# Patient Record
Sex: Male | Born: 1940
Health system: Southern US, Community
[De-identification: ages and names within clinical notes are randomized; demographics above are authoritative.]

## PROBLEM LIST (undated history)

## (undated) DIAGNOSIS — I700 Atherosclerosis of aorta: Secondary | ICD-10-CM

## (undated) DIAGNOSIS — I509 Heart failure, unspecified: Secondary | ICD-10-CM

## (undated) DIAGNOSIS — N2581 Secondary hyperparathyroidism of renal origin: Secondary | ICD-10-CM

## (undated) DIAGNOSIS — C801 Malignant (primary) neoplasm, unspecified: Secondary | ICD-10-CM

## (undated) DIAGNOSIS — Z8616 Personal history of COVID-19: Secondary | ICD-10-CM

## (undated) DIAGNOSIS — G4730 Sleep apnea, unspecified: Secondary | ICD-10-CM

## (undated) DIAGNOSIS — E1129 Type 2 diabetes mellitus with other diabetic kidney complication: Secondary | ICD-10-CM

## (undated) DIAGNOSIS — R600 Localized edema: Secondary | ICD-10-CM

## (undated) DIAGNOSIS — G4733 Obstructive sleep apnea (adult) (pediatric): Secondary | ICD-10-CM

## (undated) DIAGNOSIS — R609 Edema, unspecified: Secondary | ICD-10-CM

## (undated) DIAGNOSIS — K4090 Unilateral inguinal hernia, without obstruction or gangrene, not specified as recurrent: Secondary | ICD-10-CM

## (undated) DIAGNOSIS — N184 Chronic kidney disease, stage 4 (severe): Secondary | ICD-10-CM

## (undated) DIAGNOSIS — R0600 Dyspnea, unspecified: Secondary | ICD-10-CM

## (undated) DIAGNOSIS — C4490 Unspecified malignant neoplasm of skin, unspecified: Secondary | ICD-10-CM

## (undated) DIAGNOSIS — J45909 Unspecified asthma, uncomplicated: Secondary | ICD-10-CM

## (undated) DIAGNOSIS — I2510 Atherosclerotic heart disease of native coronary artery without angina pectoris: Secondary | ICD-10-CM

## (undated) DIAGNOSIS — E785 Hyperlipidemia, unspecified: Secondary | ICD-10-CM

## (undated) DIAGNOSIS — G629 Polyneuropathy, unspecified: Secondary | ICD-10-CM

## (undated) DIAGNOSIS — N189 Chronic kidney disease, unspecified: Secondary | ICD-10-CM

## (undated) DIAGNOSIS — E1140 Type 2 diabetes mellitus with diabetic neuropathy, unspecified: Secondary | ICD-10-CM

## (undated) DIAGNOSIS — N200 Calculus of kidney: Secondary | ICD-10-CM

## (undated) DIAGNOSIS — E669 Obesity, unspecified: Secondary | ICD-10-CM

## (undated) DIAGNOSIS — N400 Enlarged prostate without lower urinary tract symptoms: Secondary | ICD-10-CM

## (undated) DIAGNOSIS — I5030 Unspecified diastolic (congestive) heart failure: Secondary | ICD-10-CM

## (undated) DIAGNOSIS — R0609 Other forms of dyspnea: Secondary | ICD-10-CM

## (undated) DIAGNOSIS — G510 Bell's palsy: Secondary | ICD-10-CM

## (undated) DIAGNOSIS — I10 Essential (primary) hypertension: Secondary | ICD-10-CM

## (undated) DIAGNOSIS — Z87442 Personal history of urinary calculi: Secondary | ICD-10-CM

## (undated) DIAGNOSIS — M67431 Ganglion, right wrist: Secondary | ICD-10-CM

## (undated) DIAGNOSIS — M1990 Unspecified osteoarthritis, unspecified site: Secondary | ICD-10-CM

## (undated) DIAGNOSIS — K760 Fatty (change of) liver, not elsewhere classified: Secondary | ICD-10-CM

## (undated) DIAGNOSIS — R7989 Other specified abnormal findings of blood chemistry: Secondary | ICD-10-CM

## (undated) DIAGNOSIS — D631 Anemia in chronic kidney disease: Secondary | ICD-10-CM

## (undated) DIAGNOSIS — E119 Type 2 diabetes mellitus without complications: Secondary | ICD-10-CM

## (undated) HISTORY — DX: Heart failure, unspecified: I50.9

## (undated) HISTORY — PX: INGUINAL HERNIA REPAIR: SUR1180

## (undated) HISTORY — PX: HERNIA REPAIR: SHX51

## (undated) HISTORY — DX: Calculus of kidney: N20.0

## (undated) HISTORY — PX: COLONOSCOPY: SHX174

## (undated) HISTORY — PX: WRIST FRACTURE SURGERY: SHX121

## (undated) HISTORY — PX: HAND SURGERY: SHX662

## (undated) HISTORY — PX: TONSILLECTOMY: SUR1361

## (undated) HISTORY — PX: GANGLION CYST EXCISION: SHX1691

## (undated) HISTORY — PX: CHOLECYSTECTOMY: SHX55

## (undated) HISTORY — PX: FRACTURE SURGERY: SHX138

## (undated) HISTORY — PX: APPENDECTOMY: SHX54

---

## 2005-01-22 ENCOUNTER — Emergency Department: Payer: Self-pay | Admitting: Unknown Physician Specialty

## 2005-01-22 ENCOUNTER — Other Ambulatory Visit: Payer: Self-pay

## 2005-01-23 ENCOUNTER — Ambulatory Visit: Payer: Self-pay | Admitting: Unknown Physician Specialty

## 2005-02-06 ENCOUNTER — Ambulatory Visit: Payer: Self-pay | Admitting: Vascular Surgery

## 2007-02-18 ENCOUNTER — Ambulatory Visit: Payer: Self-pay | Admitting: Otolaryngology

## 2007-02-23 ENCOUNTER — Ambulatory Visit: Payer: Self-pay | Admitting: Otolaryngology

## 2009-09-16 ENCOUNTER — Inpatient Hospital Stay: Payer: Self-pay | Admitting: Internal Medicine

## 2010-01-08 ENCOUNTER — Ambulatory Visit: Payer: Self-pay | Admitting: Unknown Physician Specialty

## 2010-05-17 ENCOUNTER — Ambulatory Visit: Payer: Self-pay | Admitting: Internal Medicine

## 2011-04-25 ENCOUNTER — Ambulatory Visit: Payer: Self-pay | Admitting: Orthopedic Surgery

## 2011-04-25 DIAGNOSIS — E119 Type 2 diabetes mellitus without complications: Secondary | ICD-10-CM

## 2011-05-02 ENCOUNTER — Ambulatory Visit: Payer: Self-pay | Admitting: Orthopedic Surgery

## 2011-05-07 LAB — PATHOLOGY REPORT

## 2012-11-04 ENCOUNTER — Emergency Department: Payer: Self-pay | Admitting: Emergency Medicine

## 2013-03-29 ENCOUNTER — Ambulatory Visit: Payer: Self-pay | Admitting: Unknown Physician Specialty

## 2013-11-15 ENCOUNTER — Ambulatory Visit: Payer: Self-pay | Admitting: Family Medicine

## 2013-11-27 ENCOUNTER — Emergency Department: Payer: Self-pay | Admitting: Emergency Medicine

## 2013-11-27 LAB — COMPREHENSIVE METABOLIC PANEL
ALK PHOS: 82 U/L
Albumin: 3.1 g/dL — ABNORMAL LOW (ref 3.4–5.0)
Anion Gap: 7 (ref 7–16)
BUN: 42 mg/dL — ABNORMAL HIGH (ref 7–18)
Bilirubin,Total: 0.4 mg/dL (ref 0.2–1.0)
CALCIUM: 7.6 mg/dL — AB (ref 8.5–10.1)
CHLORIDE: 105 mmol/L (ref 98–107)
CREATININE: 2.14 mg/dL — AB (ref 0.60–1.30)
Co2: 25 mmol/L (ref 21–32)
EGFR (Non-African Amer.): 30 — ABNORMAL LOW
GFR CALC AF AMER: 34 — AB
Glucose: 98 mg/dL (ref 65–99)
Osmolality: 284 (ref 275–301)
POTASSIUM: 3.5 mmol/L (ref 3.5–5.1)
SGOT(AST): 47 U/L — ABNORMAL HIGH (ref 15–37)
SGPT (ALT): 41 U/L (ref 12–78)
Sodium: 137 mmol/L (ref 136–145)
TOTAL PROTEIN: 7.4 g/dL (ref 6.4–8.2)

## 2013-11-27 LAB — TROPONIN I

## 2013-11-27 LAB — CBC WITH DIFFERENTIAL/PLATELET
Basophil #: 0.1 10*3/uL (ref 0.0–0.1)
Basophil %: 0.6 %
Eosinophil #: 0.2 10*3/uL (ref 0.0–0.7)
Eosinophil %: 1.1 %
HCT: 49.7 % (ref 40.0–52.0)
HGB: 15.6 g/dL (ref 13.0–18.0)
LYMPHS PCT: 6.1 %
Lymphocyte #: 1.1 10*3/uL (ref 1.0–3.6)
MCH: 27.6 pg (ref 26.0–34.0)
MCHC: 31.3 g/dL — AB (ref 32.0–36.0)
MCV: 88 fL (ref 80–100)
Monocyte #: 1 x10 3/mm (ref 0.2–1.0)
Monocyte %: 5.8 %
Neutrophil #: 15.2 10*3/uL — ABNORMAL HIGH (ref 1.4–6.5)
Neutrophil %: 86.4 %
Platelet: 343 10*3/uL (ref 150–440)
RBC: 5.65 10*6/uL (ref 4.40–5.90)
RDW: 14.6 % — ABNORMAL HIGH (ref 11.5–14.5)
WBC: 17.5 10*3/uL — ABNORMAL HIGH (ref 3.8–10.6)

## 2013-11-27 LAB — URINALYSIS, COMPLETE
BILIRUBIN, UR: NEGATIVE
Bacteria: NONE SEEN
GLUCOSE, UR: NEGATIVE mg/dL (ref 0–75)
Ketone: NEGATIVE
Leukocyte Esterase: NEGATIVE
Nitrite: NEGATIVE
PH: 5 (ref 4.5–8.0)
Protein: 30
RBC,UR: <1 /HPF (ref 0–5)
Specific Gravity: 1.008 (ref 1.003–1.030)
Squamous Epithelial: NONE SEEN

## 2013-11-27 LAB — LIPASE, BLOOD: Lipase: 98 U/L (ref 73–393)

## 2013-11-27 LAB — CLOSTRIDIUM DIFFICILE(ARMC)

## 2013-11-29 LAB — STOOL CULTURE

## 2014-03-19 DIAGNOSIS — E1129 Type 2 diabetes mellitus with other diabetic kidney complication: Secondary | ICD-10-CM | POA: Insufficient documentation

## 2014-03-19 DIAGNOSIS — I10 Essential (primary) hypertension: Secondary | ICD-10-CM | POA: Insufficient documentation

## 2014-03-19 DIAGNOSIS — G4730 Sleep apnea, unspecified: Secondary | ICD-10-CM | POA: Insufficient documentation

## 2014-03-19 DIAGNOSIS — E785 Hyperlipidemia, unspecified: Secondary | ICD-10-CM | POA: Insufficient documentation

## 2014-03-29 ENCOUNTER — Ambulatory Visit: Payer: Self-pay

## 2014-03-31 ENCOUNTER — Ambulatory Visit: Payer: Self-pay

## 2014-04-08 LAB — WOUND CULTURE

## 2014-05-05 ENCOUNTER — Ambulatory Visit: Payer: Self-pay

## 2014-09-12 DIAGNOSIS — J3089 Other allergic rhinitis: Secondary | ICD-10-CM | POA: Insufficient documentation

## 2014-11-11 DIAGNOSIS — Z794 Long term (current) use of insulin: Secondary | ICD-10-CM | POA: Insufficient documentation

## 2014-11-11 DIAGNOSIS — E66813 Obesity, class 3: Secondary | ICD-10-CM | POA: Insufficient documentation

## 2014-11-11 DIAGNOSIS — R809 Proteinuria, unspecified: Secondary | ICD-10-CM | POA: Insufficient documentation

## 2015-08-04 DIAGNOSIS — G4733 Obstructive sleep apnea (adult) (pediatric): Secondary | ICD-10-CM | POA: Diagnosis not present

## 2015-08-12 ENCOUNTER — Emergency Department
Admission: EM | Admit: 2015-08-12 | Discharge: 2015-08-12 | Disposition: A | Discharge status: Home / Self Care | Payer: PPO | Attending: Emergency Medicine | Admitting: Emergency Medicine

## 2015-08-12 DIAGNOSIS — I10 Essential (primary) hypertension: Secondary | ICD-10-CM | POA: Diagnosis not present

## 2015-08-12 DIAGNOSIS — E11649 Type 2 diabetes mellitus with hypoglycemia without coma: Secondary | ICD-10-CM | POA: Diagnosis not present

## 2015-08-12 DIAGNOSIS — R2240 Localized swelling, mass and lump, unspecified lower limb: Secondary | ICD-10-CM | POA: Insufficient documentation

## 2015-08-12 DIAGNOSIS — E162 Hypoglycemia, unspecified: Secondary | ICD-10-CM | POA: Diagnosis not present

## 2015-08-12 DIAGNOSIS — Z794 Long term (current) use of insulin: Secondary | ICD-10-CM | POA: Diagnosis not present

## 2015-08-12 HISTORY — DX: Essential (primary) hypertension: I10

## 2015-08-12 HISTORY — DX: Type 2 diabetes mellitus without complications: E11.9

## 2015-08-12 HISTORY — DX: Hyperlipidemia, unspecified: E78.5

## 2015-08-12 LAB — URINALYSIS COMPLETE WITH MICROSCOPIC (ARMC ONLY)
BACTERIA UA: NONE SEEN
Bilirubin Urine: NEGATIVE
GLUCOSE, UA: 50 mg/dL — AB
Ketones, ur: NEGATIVE mg/dL
Leukocytes, UA: NEGATIVE
Nitrite: NEGATIVE
PROTEIN: 100 mg/dL — AB
SPECIFIC GRAVITY, URINE: 1.013 (ref 1.005–1.030)
pH: 5 (ref 5.0–8.0)

## 2015-08-12 LAB — COMPREHENSIVE METABOLIC PANEL
ALT: 22 U/L (ref 17–63)
AST: 24 U/L (ref 15–41)
Albumin: 3.3 g/dL — ABNORMAL LOW (ref 3.5–5.0)
Alkaline Phosphatase: 84 U/L (ref 38–126)
Anion gap: 9 (ref 5–15)
BILIRUBIN TOTAL: 0.4 mg/dL (ref 0.3–1.2)
BUN: 42 mg/dL — AB (ref 6–20)
CO2: 30 mmol/L (ref 22–32)
CREATININE: 1.82 mg/dL — AB (ref 0.61–1.24)
Calcium: 9.5 mg/dL (ref 8.9–10.3)
Chloride: 100 mmol/L — ABNORMAL LOW (ref 101–111)
GFR calc Af Amer: 40 mL/min — ABNORMAL LOW (ref >60)
GFR, EST NON AFRICAN AMERICAN: 35 mL/min — AB (ref >60)
Glucose, Bld: 149 mg/dL — ABNORMAL HIGH (ref 65–99)
Potassium: 3.9 mmol/L (ref 3.5–5.1)
Sodium: 139 mmol/L (ref 135–145)
TOTAL PROTEIN: 7.5 g/dL (ref 6.5–8.1)

## 2015-08-12 LAB — CBC
HEMATOCRIT: 43.1 % (ref 40.0–52.0)
Hemoglobin: 13.8 g/dL (ref 13.0–18.0)
MCH: 27.8 pg (ref 26.0–34.0)
MCHC: 32.1 g/dL (ref 32.0–36.0)
MCV: 86.8 fL (ref 80.0–100.0)
Platelets: 283 10*3/uL (ref 150–440)
RBC: 4.96 MIL/uL (ref 4.40–5.90)
RDW: 14.1 % (ref 11.5–14.5)
WBC: 12.3 10*3/uL — AB (ref 3.8–10.6)

## 2015-08-12 LAB — GLUCOSE, CAPILLARY
GLUCOSE-CAPILLARY: 143 mg/dL — AB (ref 65–99)
GLUCOSE-CAPILLARY: 127 mg/dL — AB (ref 65–99)
GLUCOSE-CAPILLARY: 88 mg/dL (ref 65–99)
GLUCOSE-CAPILLARY: 142 mg/dL — AB (ref 65–99)
Glucose-Capillary: 196 mg/dL — ABNORMAL HIGH (ref 65–99)

## 2015-08-12 LAB — TROPONIN I
TROPONIN I: 0.03 ng/mL (ref <0.031)
Troponin I: 0.04 ng/mL — ABNORMAL HIGH (ref <0.031)

## 2015-08-12 LAB — LIPASE, BLOOD: LIPASE: 28 U/L (ref 11–51)

## 2015-08-12 NOTE — Discharge Instructions (Signed)
Your blood sugar levels appear to have improved.  As discussed with endocrinology: Please stop Levemir You may continue your Humulin R at your normal meal time dosing.  Make sure that you check your blood sugar at least every 4 hours, and once at 1 or 2 in the morning. If any of your blood sugars are less than 80, or you develop symptoms of low blood sugar such as sweating, confusion, weakness, or other concerns please call 911 or the on-call endocrinologist.  Return to the emergency room and call 911 right away if you have sudden or severe confusion, numbness or tingling, chest pain, nausea and vomiting, become unresponsive or have a seizure, or other new concerns arise

## 2015-08-12 NOTE — ED Notes (Signed)
Pt reports hypoglycemia since last nights, states lowest it has gotten has been 40s. Been eating lots of sweets/OJ. Reports dizziness/shaking. currently 169

## 2015-08-12 NOTE — ED Provider Notes (Signed)
[facility] Emergency Department Provider Note  ____________________________________________  Time seen: Approximately 2:39 PM  I have reviewed the triage vital signs and the nursing notes.   HISTORY  Chief Complaint Hypoglycemia    HPI Viviano Fladger is a 75 y.o. male history of diabetes, hypertension.  Patient reports that in his normal health, but since last evening he's had a couple episodes. He's been getting sweaty and had low blood sugars low as 47. He reports that he is using his insulin normally and did not use any of his sliding scale insulin today. He takes up to 80 units of long-acting insulin nightly, and reports this dose has not been adjusted for several years. He is unclear why, and does not believe he accidentally overdosed. He reports he otherwise feels well. He drinks several orange juices, had a meal this morning and his blood sugar was still only in the 80s so he felt he should come for evaluation as he continues to have about 2 episodes of low blood sugar despite eating normally and not using any further insulin today.  No headache, fevers, chills, cough, numbness, weakness or tingling.  Past Medical History  Diagnosis Date  . Diabetes mellitus without complication ([place])   . Hypertension   . Hyperlipemia     There are no active problems to display for this patient.   History reviewed. No pertinent past surgical history.  No current outpatient prescriptions on file.  Allergies Codeine  No family history on file.  Social History Social History  Substance Use Topics  . Smoking status: Never Smoker   . Smokeless tobacco: None  . Alcohol Use: No    Review of Systems Constitutional: No fever/chills Eyes: No visual changes. ENT: No sore throat. Cardiovascular: Denies chest pain. Respiratory: Denies shortness of breath. Gastrointestinal: No abdominal pain.  No nausea, no vomiting.  No diarrhea.  No  constipation. Genitourinary: Negative for dysuria. Musculoskeletal: Negative for back pain. Skin: Negative or rash. Neurological: Negative for headaches, focal weakness or numbness.  10-point ROS otherwise negative.  ____________________________________________   PHYSICAL EXAM:  VITAL SIGNS: ED Triage Vitals  Enc Vitals Group     BP 08/12/15 1030 137/73 mmHg     Pulse Rate 08/12/15 1030 68     Resp 08/12/15 1030 18     Temp --      Temp src --      SpO2 08/12/15 1030 98 %     Weight --      Height --      Head Cir --      Peak Flow --      Pain Score --      Pain Loc --      Pain Edu? --      Excl. in [place]? --    Constitutional: Alert and oriented. Well appearing and in no acute distress. Eyes: Conjunctivae are normal. PERRL. EOMI. Head: Atraumatic. Nose: No congestion/rhinnorhea. Mouth/Throat: Mucous membranes are moist.  Oropharynx non-erythematous. Neck: No stridor.   Cardiovascular: Normal rate, regular rhythm. Grossly normal heart sounds.  Good peripheral circulation. Respiratory: Normal respiratory effort.  No retractions. Lungs CTAB. Gastrointestinal: Soft and nontender. No distention. No abdominal bruits. No CVA tenderness. Musculoskeletal: No lower extremity tenderness with 1+ lower extremity pitting edema which the patient reports he's had stably for years.  No joint effusions. Neurologic:  Normal speech and language. No gross focal neurologic deficits are appreciated. No gait instability. Skin:  Skin is warm, dry and  intact. No rash noted. Psychiatric: Mood and affect are normal. Speech and behavior are normal.  ____________________________________________   LABS (all labs ordered are listed, but only abnormal results are displayed)  Labs Reviewed  CBC - Abnormal; Notable for the following:    WBC 12.3 (*)    All other components within normal limits  COMPREHENSIVE METABOLIC PANEL - Abnormal; Notable for the following:    Chloride 100 (*)    Glucose,  Bld 149 (*)    BUN 42 (*)    Creatinine, Ser 1.82 (*)    Albumin 3.3 (*)    GFR calc non Af Amer 35 (*)    GFR calc Af Amer 40 (*)    All other components within normal limits  TROPONIN I - Abnormal; Notable for the following:    Troponin I 0.04 (*)    All other components within normal limits  URINALYSIS COMPLETEWITH MICROSCOPIC ([facility] ONLY) - Abnormal; Notable for the following:    Color, Urine STRAW (*)    APPearance CLEAR (*)    Glucose, UA 50 (*)    Hgb urine dipstick 1+ (*)    Protein, ur 100 (*)    Squamous Epithelial / LPF 0-5 (*)    All other components within normal limits  GLUCOSE, CAPILLARY - Abnormal; Notable for the following:    Glucose-Capillary 143 (*)    All other components within normal limits  GLUCOSE, CAPILLARY - Abnormal; Notable for the following:    Glucose-Capillary 127 (*)    All other components within normal limits  GLUCOSE, CAPILLARY - Abnormal; Notable for the following:    Glucose-Capillary 196 (*)    All other components within normal limits  LIPASE, BLOOD  GLUCOSE, CAPILLARY  TROPONIN I  CBG MONITORING, ED  CBG MONITORING, ED  CBG MONITORING, ED   ____________________________________________  EKG  Reviewed and interpreted by me at 1120 EKG normal sinus rhythm, mild artifact is present but no acute ischemic abnormality noted. QTc 420 QRS 110 PR 116 Heart rate 65  ____________________________________________  RADIOLOGY   ____________________________________________   PROCEDURES  Procedure(s) performed: None  Critical Care performed: No  ____________________________________________   INITIAL IMPRESSION / ASSESSMENT AND PLAN / ED COURSE  Pertinent labs & imaging results that were available during my care of the patient were reviewed by me and considered in my medical decision making (see chart for details).  Patient presents for evaluation of hypoglycemia. The 2 episodes early this morning, continues to use his insulin  normally. He did not use sliding scale as morning. Little unclear as to the exact cause of his hypoglycemia.  D/W Dr. Berneice Gandy Endocrinology: [place] Advises Stop Levemir.until seen in follow-up with Dr. Gabriel Carina (who has been notifed by endocrine MD) Continue his Humalin with meals. No nighttime insulin. Recommends discharge.  Patient's blood sugars have stabilized, now improving. Have a safe plan of care and placed in patient and family have close contact of recent broken with endocrinology. Endocrinology advises discharge, stopping Levemir and plan clear with family. Second troponin normal, second EKG unchanged with no evidence of ischemic disease.  Discharge home awake alert no distress. Resting comfortably. ____________________________________________   FINAL CLINICAL IMPRESSION(S) / ED DIAGNOSES  Final diagnoses:  Hypoglycemia      Delman Kitten, MD 08/12/15 1600

## 2015-08-14 DIAGNOSIS — R809 Proteinuria, unspecified: Secondary | ICD-10-CM | POA: Diagnosis not present

## 2015-08-14 DIAGNOSIS — E11649 Type 2 diabetes mellitus with hypoglycemia without coma: Secondary | ICD-10-CM | POA: Diagnosis not present

## 2015-08-14 DIAGNOSIS — N184 Chronic kidney disease, stage 4 (severe): Secondary | ICD-10-CM | POA: Diagnosis not present

## 2015-08-14 DIAGNOSIS — Z794 Long term (current) use of insulin: Secondary | ICD-10-CM | POA: Diagnosis not present

## 2015-08-14 DIAGNOSIS — E1122 Type 2 diabetes mellitus with diabetic chronic kidney disease: Secondary | ICD-10-CM | POA: Diagnosis not present

## 2015-08-18 DIAGNOSIS — R809 Proteinuria, unspecified: Secondary | ICD-10-CM | POA: Diagnosis not present

## 2015-08-18 DIAGNOSIS — N183 Chronic kidney disease, stage 3 (moderate): Secondary | ICD-10-CM | POA: Diagnosis not present

## 2015-08-18 DIAGNOSIS — Z794 Long term (current) use of insulin: Secondary | ICD-10-CM | POA: Diagnosis not present

## 2015-08-18 DIAGNOSIS — E1122 Type 2 diabetes mellitus with diabetic chronic kidney disease: Secondary | ICD-10-CM | POA: Diagnosis not present

## 2015-08-25 DIAGNOSIS — Z794 Long term (current) use of insulin: Secondary | ICD-10-CM | POA: Diagnosis not present

## 2015-08-25 DIAGNOSIS — E1165 Type 2 diabetes mellitus with hyperglycemia: Secondary | ICD-10-CM | POA: Diagnosis not present

## 2015-08-25 DIAGNOSIS — N184 Chronic kidney disease, stage 4 (severe): Secondary | ICD-10-CM | POA: Diagnosis not present

## 2015-08-25 DIAGNOSIS — E1122 Type 2 diabetes mellitus with diabetic chronic kidney disease: Secondary | ICD-10-CM | POA: Diagnosis not present

## 2015-09-11 DIAGNOSIS — M2391 Unspecified internal derangement of right knee: Secondary | ICD-10-CM | POA: Diagnosis not present

## 2015-09-11 DIAGNOSIS — M25561 Pain in right knee: Secondary | ICD-10-CM | POA: Diagnosis not present

## 2015-09-21 DIAGNOSIS — E1122 Type 2 diabetes mellitus with diabetic chronic kidney disease: Secondary | ICD-10-CM | POA: Diagnosis not present

## 2015-09-21 DIAGNOSIS — J301 Allergic rhinitis due to pollen: Secondary | ICD-10-CM | POA: Diagnosis not present

## 2015-09-21 DIAGNOSIS — Z6841 Body Mass Index (BMI) 40.0 and over, adult: Secondary | ICD-10-CM | POA: Diagnosis not present

## 2015-09-21 DIAGNOSIS — E7800 Pure hypercholesterolemia, unspecified: Secondary | ICD-10-CM | POA: Diagnosis not present

## 2015-09-21 DIAGNOSIS — N183 Chronic kidney disease, stage 3 (moderate): Secondary | ICD-10-CM | POA: Diagnosis not present

## 2015-09-21 DIAGNOSIS — G4733 Obstructive sleep apnea (adult) (pediatric): Secondary | ICD-10-CM | POA: Diagnosis not present

## 2015-09-21 DIAGNOSIS — Z794 Long term (current) use of insulin: Secondary | ICD-10-CM | POA: Diagnosis not present

## 2015-09-21 DIAGNOSIS — I10 Essential (primary) hypertension: Secondary | ICD-10-CM | POA: Diagnosis not present

## 2015-11-16 DIAGNOSIS — E1165 Type 2 diabetes mellitus with hyperglycemia: Secondary | ICD-10-CM | POA: Diagnosis not present

## 2015-11-16 DIAGNOSIS — Z794 Long term (current) use of insulin: Secondary | ICD-10-CM | POA: Diagnosis not present

## 2015-11-22 DIAGNOSIS — N183 Chronic kidney disease, stage 3 (moderate): Secondary | ICD-10-CM | POA: Diagnosis not present

## 2015-11-22 DIAGNOSIS — E1122 Type 2 diabetes mellitus with diabetic chronic kidney disease: Secondary | ICD-10-CM | POA: Diagnosis not present

## 2015-11-27 DIAGNOSIS — J301 Allergic rhinitis due to pollen: Secondary | ICD-10-CM | POA: Diagnosis not present

## 2016-01-12 DIAGNOSIS — E1122 Type 2 diabetes mellitus with diabetic chronic kidney disease: Secondary | ICD-10-CM | POA: Diagnosis not present

## 2016-01-12 DIAGNOSIS — Z794 Long term (current) use of insulin: Secondary | ICD-10-CM | POA: Diagnosis not present

## 2016-01-12 DIAGNOSIS — N183 Chronic kidney disease, stage 3 (moderate): Secondary | ICD-10-CM | POA: Diagnosis not present

## 2016-01-12 DIAGNOSIS — E1165 Type 2 diabetes mellitus with hyperglycemia: Secondary | ICD-10-CM | POA: Diagnosis not present

## 2016-01-31 DIAGNOSIS — L918 Other hypertrophic disorders of the skin: Secondary | ICD-10-CM | POA: Diagnosis not present

## 2016-01-31 DIAGNOSIS — Z1283 Encounter for screening for malignant neoplasm of skin: Secondary | ICD-10-CM | POA: Diagnosis not present

## 2016-01-31 DIAGNOSIS — L538 Other specified erythematous conditions: Secondary | ICD-10-CM | POA: Diagnosis not present

## 2016-01-31 DIAGNOSIS — L298 Other pruritus: Secondary | ICD-10-CM | POA: Diagnosis not present

## 2016-01-31 DIAGNOSIS — R238 Other skin changes: Secondary | ICD-10-CM | POA: Diagnosis not present

## 2016-01-31 DIAGNOSIS — R208 Other disturbances of skin sensation: Secondary | ICD-10-CM | POA: Diagnosis not present

## 2016-01-31 DIAGNOSIS — Z789 Other specified health status: Secondary | ICD-10-CM | POA: Diagnosis not present

## 2016-01-31 DIAGNOSIS — D2262 Melanocytic nevi of left upper limb, including shoulder: Secondary | ICD-10-CM | POA: Diagnosis not present

## 2016-02-19 DIAGNOSIS — E1122 Type 2 diabetes mellitus with diabetic chronic kidney disease: Secondary | ICD-10-CM | POA: Diagnosis not present

## 2016-02-19 DIAGNOSIS — N183 Chronic kidney disease, stage 3 (moderate): Secondary | ICD-10-CM | POA: Diagnosis not present

## 2016-02-23 DIAGNOSIS — E1122 Type 2 diabetes mellitus with diabetic chronic kidney disease: Secondary | ICD-10-CM | POA: Diagnosis not present

## 2016-02-23 DIAGNOSIS — Z794 Long term (current) use of insulin: Secondary | ICD-10-CM | POA: Diagnosis not present

## 2016-02-23 DIAGNOSIS — N183 Chronic kidney disease, stage 3 (moderate): Secondary | ICD-10-CM | POA: Diagnosis not present

## 2016-02-23 DIAGNOSIS — E1121 Type 2 diabetes mellitus with diabetic nephropathy: Secondary | ICD-10-CM | POA: Diagnosis not present

## 2016-02-27 DIAGNOSIS — H43813 Vitreous degeneration, bilateral: Secondary | ICD-10-CM | POA: Diagnosis not present

## 2016-03-28 DIAGNOSIS — J301 Allergic rhinitis due to pollen: Secondary | ICD-10-CM | POA: Diagnosis not present

## 2016-03-28 DIAGNOSIS — N183 Chronic kidney disease, stage 3 (moderate): Secondary | ICD-10-CM | POA: Diagnosis not present

## 2016-03-28 DIAGNOSIS — G4733 Obstructive sleep apnea (adult) (pediatric): Secondary | ICD-10-CM | POA: Diagnosis not present

## 2016-03-28 DIAGNOSIS — Z794 Long term (current) use of insulin: Secondary | ICD-10-CM | POA: Diagnosis not present

## 2016-03-28 DIAGNOSIS — E7800 Pure hypercholesterolemia, unspecified: Secondary | ICD-10-CM | POA: Diagnosis not present

## 2016-03-28 DIAGNOSIS — E1122 Type 2 diabetes mellitus with diabetic chronic kidney disease: Secondary | ICD-10-CM | POA: Diagnosis not present

## 2016-03-28 DIAGNOSIS — Z6841 Body Mass Index (BMI) 40.0 and over, adult: Secondary | ICD-10-CM | POA: Diagnosis not present

## 2016-03-28 DIAGNOSIS — I10 Essential (primary) hypertension: Secondary | ICD-10-CM | POA: Diagnosis not present

## 2016-05-24 DIAGNOSIS — N183 Chronic kidney disease, stage 3 (moderate): Secondary | ICD-10-CM | POA: Diagnosis not present

## 2016-05-24 DIAGNOSIS — E1122 Type 2 diabetes mellitus with diabetic chronic kidney disease: Secondary | ICD-10-CM | POA: Diagnosis not present

## 2016-05-28 DIAGNOSIS — G4733 Obstructive sleep apnea (adult) (pediatric): Secondary | ICD-10-CM | POA: Diagnosis not present

## 2016-05-31 DIAGNOSIS — E1121 Type 2 diabetes mellitus with diabetic nephropathy: Secondary | ICD-10-CM | POA: Diagnosis not present

## 2016-05-31 DIAGNOSIS — Z794 Long term (current) use of insulin: Secondary | ICD-10-CM | POA: Diagnosis not present

## 2016-07-22 DIAGNOSIS — Z9841 Cataract extraction status, right eye: Secondary | ICD-10-CM

## 2016-07-22 HISTORY — DX: Cataract extraction status, right eye: Z98.41

## 2016-08-12 DIAGNOSIS — L720 Epidermal cyst: Secondary | ICD-10-CM | POA: Diagnosis not present

## 2016-08-12 DIAGNOSIS — D485 Neoplasm of uncertain behavior of skin: Secondary | ICD-10-CM | POA: Diagnosis not present

## 2016-08-12 DIAGNOSIS — L570 Actinic keratosis: Secondary | ICD-10-CM | POA: Diagnosis not present

## 2016-08-12 DIAGNOSIS — L918 Other hypertrophic disorders of the skin: Secondary | ICD-10-CM | POA: Diagnosis not present

## 2016-08-26 DIAGNOSIS — D229 Melanocytic nevi, unspecified: Secondary | ICD-10-CM | POA: Diagnosis not present

## 2016-08-26 DIAGNOSIS — H2513 Age-related nuclear cataract, bilateral: Secondary | ICD-10-CM | POA: Diagnosis not present

## 2016-08-26 DIAGNOSIS — E1121 Type 2 diabetes mellitus with diabetic nephropathy: Secondary | ICD-10-CM | POA: Diagnosis not present

## 2016-08-26 DIAGNOSIS — Z794 Long term (current) use of insulin: Secondary | ICD-10-CM | POA: Diagnosis not present

## 2016-08-26 DIAGNOSIS — D485 Neoplasm of uncertain behavior of skin: Secondary | ICD-10-CM | POA: Diagnosis not present

## 2016-09-02 DIAGNOSIS — Z794 Long term (current) use of insulin: Secondary | ICD-10-CM | POA: Diagnosis not present

## 2016-09-02 DIAGNOSIS — R809 Proteinuria, unspecified: Secondary | ICD-10-CM | POA: Diagnosis not present

## 2016-09-02 DIAGNOSIS — D229 Melanocytic nevi, unspecified: Secondary | ICD-10-CM | POA: Diagnosis not present

## 2016-09-02 DIAGNOSIS — E1121 Type 2 diabetes mellitus with diabetic nephropathy: Secondary | ICD-10-CM | POA: Diagnosis not present

## 2016-09-02 DIAGNOSIS — N183 Chronic kidney disease, stage 3 (moderate): Secondary | ICD-10-CM | POA: Diagnosis not present

## 2016-09-02 DIAGNOSIS — E1122 Type 2 diabetes mellitus with diabetic chronic kidney disease: Secondary | ICD-10-CM | POA: Diagnosis not present

## 2016-10-02 DIAGNOSIS — J309 Allergic rhinitis, unspecified: Secondary | ICD-10-CM | POA: Diagnosis not present

## 2016-10-22 DIAGNOSIS — G4733 Obstructive sleep apnea (adult) (pediatric): Secondary | ICD-10-CM | POA: Diagnosis not present

## 2016-10-22 DIAGNOSIS — I10 Essential (primary) hypertension: Secondary | ICD-10-CM | POA: Diagnosis not present

## 2016-10-22 DIAGNOSIS — N183 Chronic kidney disease, stage 3 (moderate): Secondary | ICD-10-CM | POA: Diagnosis not present

## 2016-10-22 DIAGNOSIS — Z6841 Body Mass Index (BMI) 40.0 and over, adult: Secondary | ICD-10-CM | POA: Diagnosis not present

## 2016-10-22 DIAGNOSIS — E7800 Pure hypercholesterolemia, unspecified: Secondary | ICD-10-CM | POA: Diagnosis not present

## 2016-10-22 DIAGNOSIS — E1122 Type 2 diabetes mellitus with diabetic chronic kidney disease: Secondary | ICD-10-CM | POA: Diagnosis not present

## 2016-10-22 DIAGNOSIS — J301 Allergic rhinitis due to pollen: Secondary | ICD-10-CM | POA: Diagnosis not present

## 2016-10-22 DIAGNOSIS — Z794 Long term (current) use of insulin: Secondary | ICD-10-CM | POA: Diagnosis not present

## 2016-10-22 DIAGNOSIS — R0609 Other forms of dyspnea: Secondary | ICD-10-CM | POA: Diagnosis not present

## 2016-11-07 DIAGNOSIS — E7800 Pure hypercholesterolemia, unspecified: Secondary | ICD-10-CM | POA: Diagnosis not present

## 2016-11-07 DIAGNOSIS — R0602 Shortness of breath: Secondary | ICD-10-CM | POA: Diagnosis not present

## 2016-11-07 DIAGNOSIS — G4733 Obstructive sleep apnea (adult) (pediatric): Secondary | ICD-10-CM | POA: Diagnosis not present

## 2016-11-07 DIAGNOSIS — I10 Essential (primary) hypertension: Secondary | ICD-10-CM | POA: Diagnosis not present

## 2016-11-07 DIAGNOSIS — R0600 Dyspnea, unspecified: Secondary | ICD-10-CM | POA: Diagnosis not present

## 2016-11-08 ENCOUNTER — Other Ambulatory Visit: Payer: Self-pay | Admitting: Internal Medicine

## 2016-11-08 DIAGNOSIS — R0602 Shortness of breath: Secondary | ICD-10-CM

## 2016-11-11 ENCOUNTER — Other Ambulatory Visit: Payer: Self-pay | Admitting: Internal Medicine

## 2016-11-11 DIAGNOSIS — R0602 Shortness of breath: Secondary | ICD-10-CM

## 2016-11-25 ENCOUNTER — Encounter
Admission: RE | Admit: 2016-11-25 | Discharge: 2016-11-25 | Disposition: A | Discharge status: Home / Self Care | Payer: PPO | Source: Ambulatory Visit | Attending: Internal Medicine | Admitting: Internal Medicine

## 2016-11-25 DIAGNOSIS — R0602 Shortness of breath: Secondary | ICD-10-CM | POA: Insufficient documentation

## 2016-11-25 DIAGNOSIS — E1121 Type 2 diabetes mellitus with diabetic nephropathy: Secondary | ICD-10-CM | POA: Diagnosis not present

## 2016-11-25 DIAGNOSIS — Z794 Long term (current) use of insulin: Secondary | ICD-10-CM | POA: Diagnosis not present

## 2016-11-25 DIAGNOSIS — E7800 Pure hypercholesterolemia, unspecified: Secondary | ICD-10-CM | POA: Diagnosis not present

## 2016-11-25 MED ORDER — TECHNETIUM TC 99M TETROFOSMIN IV KIT
29.6980 | PACK | Freq: Once | INTRAVENOUS | Status: AC | PRN
  Administered 2016-11-25: 29.698 via INTRAVENOUS

## 2016-11-26 ENCOUNTER — Ambulatory Visit
Admission: RE | Admit: 2016-11-26 | Discharge: 2016-11-26 | Disposition: A | Discharge status: Home / Self Care | Payer: PPO | Source: Ambulatory Visit | Attending: Internal Medicine | Admitting: Internal Medicine

## 2016-11-26 ENCOUNTER — Encounter
Admission: RE | Admit: 2016-11-26 | Discharge: 2016-11-26 | Disposition: A | Discharge status: Home / Self Care | Payer: PPO | Source: Ambulatory Visit | Attending: Internal Medicine | Admitting: Internal Medicine

## 2016-11-26 DIAGNOSIS — I340 Nonrheumatic mitral (valve) insufficiency: Secondary | ICD-10-CM | POA: Insufficient documentation

## 2016-11-26 DIAGNOSIS — R0602 Shortness of breath: Secondary | ICD-10-CM | POA: Insufficient documentation

## 2016-11-26 DIAGNOSIS — I10 Essential (primary) hypertension: Secondary | ICD-10-CM | POA: Diagnosis not present

## 2016-11-26 DIAGNOSIS — E119 Type 2 diabetes mellitus without complications: Secondary | ICD-10-CM | POA: Insufficient documentation

## 2016-11-26 MED ORDER — TECHNETIUM TC 99M TETROFOSMIN IV KIT
32.8300 | PACK | Freq: Once | INTRAVENOUS | Status: AC | PRN
  Administered 2016-11-26: 32.83 via INTRAVENOUS

## 2016-11-26 NOTE — Progress Notes (Signed)
*  PRELIMINARY RESULTS* Echocardiogram 2D Echocardiogram has been performed.  Sherrie Sport 11/26/2016, 9:29 AM

## 2016-11-27 DIAGNOSIS — R0602 Shortness of breath: Secondary | ICD-10-CM | POA: Diagnosis not present

## 2016-11-27 LAB — NM MYOCAR MULTI W/SPECT W/WALL MOTION / EF
CHL CUP NUCLEAR SDS: 1
CHL CUP NUCLEAR SSS: 7
CHL CUP RESTING HR STRESS: 71 {beats}/min
CSEPED: 2 min
CSEPPHR: 130 {beats}/min
Estimated workload: 4.6 METS
Exercise duration (sec): 22 s
LV sys vol: 34 mL
LVDIAVOL: 87 mL (ref 62–150)
SRS: 11
TID: 0.87

## 2016-12-02 DIAGNOSIS — Z794 Long term (current) use of insulin: Secondary | ICD-10-CM | POA: Diagnosis not present

## 2016-12-02 DIAGNOSIS — E1122 Type 2 diabetes mellitus with diabetic chronic kidney disease: Secondary | ICD-10-CM | POA: Diagnosis not present

## 2016-12-02 DIAGNOSIS — R809 Proteinuria, unspecified: Secondary | ICD-10-CM | POA: Diagnosis not present

## 2016-12-02 DIAGNOSIS — E1121 Type 2 diabetes mellitus with diabetic nephropathy: Secondary | ICD-10-CM | POA: Diagnosis not present

## 2016-12-02 DIAGNOSIS — N183 Chronic kidney disease, stage 3 (moderate): Secondary | ICD-10-CM | POA: Diagnosis not present

## 2016-12-05 DIAGNOSIS — I10 Essential (primary) hypertension: Secondary | ICD-10-CM | POA: Diagnosis not present

## 2016-12-05 DIAGNOSIS — R0602 Shortness of breath: Secondary | ICD-10-CM | POA: Diagnosis not present

## 2016-12-05 DIAGNOSIS — E7800 Pure hypercholesterolemia, unspecified: Secondary | ICD-10-CM | POA: Diagnosis not present

## 2016-12-06 DIAGNOSIS — T148XXA Other injury of unspecified body region, initial encounter: Secondary | ICD-10-CM | POA: Diagnosis not present

## 2016-12-17 DIAGNOSIS — R0602 Shortness of breath: Secondary | ICD-10-CM | POA: Diagnosis not present

## 2017-01-03 DIAGNOSIS — N183 Chronic kidney disease, stage 3 (moderate): Secondary | ICD-10-CM | POA: Diagnosis not present

## 2017-01-03 DIAGNOSIS — R0602 Shortness of breath: Secondary | ICD-10-CM | POA: Diagnosis not present

## 2017-01-03 DIAGNOSIS — G4733 Obstructive sleep apnea (adult) (pediatric): Secondary | ICD-10-CM | POA: Diagnosis not present

## 2017-01-03 DIAGNOSIS — Z794 Long term (current) use of insulin: Secondary | ICD-10-CM | POA: Diagnosis not present

## 2017-01-03 DIAGNOSIS — E1122 Type 2 diabetes mellitus with diabetic chronic kidney disease: Secondary | ICD-10-CM | POA: Diagnosis not present

## 2017-01-03 DIAGNOSIS — I10 Essential (primary) hypertension: Secondary | ICD-10-CM | POA: Diagnosis not present

## 2017-01-09 DIAGNOSIS — B351 Tinea unguium: Secondary | ICD-10-CM | POA: Diagnosis not present

## 2017-01-09 DIAGNOSIS — E1140 Type 2 diabetes mellitus with diabetic neuropathy, unspecified: Secondary | ICD-10-CM | POA: Diagnosis not present

## 2017-01-09 DIAGNOSIS — Z794 Long term (current) use of insulin: Secondary | ICD-10-CM | POA: Diagnosis not present

## 2017-02-20 DIAGNOSIS — G4733 Obstructive sleep apnea (adult) (pediatric): Secondary | ICD-10-CM | POA: Diagnosis not present

## 2017-02-27 DIAGNOSIS — E1162 Type 2 diabetes mellitus with skin complications: Secondary | ICD-10-CM | POA: Diagnosis not present

## 2017-03-05 DIAGNOSIS — L565 Disseminated superficial actinic porokeratosis (DSAP): Secondary | ICD-10-CM | POA: Diagnosis not present

## 2017-03-05 DIAGNOSIS — L821 Other seborrheic keratosis: Secondary | ICD-10-CM | POA: Diagnosis not present

## 2017-03-05 DIAGNOSIS — L918 Other hypertrophic disorders of the skin: Secondary | ICD-10-CM | POA: Diagnosis not present

## 2017-03-05 DIAGNOSIS — Z794 Long term (current) use of insulin: Secondary | ICD-10-CM | POA: Diagnosis not present

## 2017-03-05 DIAGNOSIS — L570 Actinic keratosis: Secondary | ICD-10-CM | POA: Diagnosis not present

## 2017-03-05 DIAGNOSIS — E1121 Type 2 diabetes mellitus with diabetic nephropathy: Secondary | ICD-10-CM | POA: Diagnosis not present

## 2017-03-05 DIAGNOSIS — Z86018 Personal history of other benign neoplasm: Secondary | ICD-10-CM | POA: Diagnosis not present

## 2017-03-12 DIAGNOSIS — I10 Essential (primary) hypertension: Secondary | ICD-10-CM | POA: Diagnosis not present

## 2017-03-12 DIAGNOSIS — Z794 Long term (current) use of insulin: Secondary | ICD-10-CM | POA: Diagnosis not present

## 2017-03-12 DIAGNOSIS — E1121 Type 2 diabetes mellitus with diabetic nephropathy: Secondary | ICD-10-CM | POA: Diagnosis not present

## 2017-03-12 DIAGNOSIS — N184 Chronic kidney disease, stage 4 (severe): Secondary | ICD-10-CM | POA: Diagnosis not present

## 2017-03-12 DIAGNOSIS — Z5181 Encounter for therapeutic drug level monitoring: Secondary | ICD-10-CM | POA: Diagnosis not present

## 2017-03-12 DIAGNOSIS — E1122 Type 2 diabetes mellitus with diabetic chronic kidney disease: Secondary | ICD-10-CM | POA: Diagnosis not present

## 2017-04-11 DIAGNOSIS — E1140 Type 2 diabetes mellitus with diabetic neuropathy, unspecified: Secondary | ICD-10-CM | POA: Diagnosis not present

## 2017-04-11 DIAGNOSIS — B351 Tinea unguium: Secondary | ICD-10-CM | POA: Diagnosis not present

## 2017-04-11 DIAGNOSIS — Z794 Long term (current) use of insulin: Secondary | ICD-10-CM | POA: Diagnosis not present

## 2017-04-21 DIAGNOSIS — I10 Essential (primary) hypertension: Secondary | ICD-10-CM | POA: Diagnosis not present

## 2017-04-21 DIAGNOSIS — N183 Chronic kidney disease, stage 3 (moderate): Secondary | ICD-10-CM | POA: Diagnosis not present

## 2017-04-21 DIAGNOSIS — E1122 Type 2 diabetes mellitus with diabetic chronic kidney disease: Secondary | ICD-10-CM | POA: Diagnosis not present

## 2017-04-21 DIAGNOSIS — N179 Acute kidney failure, unspecified: Secondary | ICD-10-CM | POA: Diagnosis not present

## 2017-04-28 DIAGNOSIS — J301 Allergic rhinitis due to pollen: Secondary | ICD-10-CM | POA: Diagnosis not present

## 2017-04-28 DIAGNOSIS — G4733 Obstructive sleep apnea (adult) (pediatric): Secondary | ICD-10-CM | POA: Diagnosis not present

## 2017-04-28 DIAGNOSIS — E1122 Type 2 diabetes mellitus with diabetic chronic kidney disease: Secondary | ICD-10-CM | POA: Diagnosis not present

## 2017-04-28 DIAGNOSIS — N183 Chronic kidney disease, stage 3 (moderate): Secondary | ICD-10-CM | POA: Diagnosis not present

## 2017-04-28 DIAGNOSIS — I10 Essential (primary) hypertension: Secondary | ICD-10-CM | POA: Diagnosis not present

## 2017-04-28 DIAGNOSIS — E7800 Pure hypercholesterolemia, unspecified: Secondary | ICD-10-CM | POA: Diagnosis not present

## 2017-04-28 DIAGNOSIS — Z6841 Body Mass Index (BMI) 40.0 and over, adult: Secondary | ICD-10-CM | POA: Diagnosis not present

## 2017-04-28 DIAGNOSIS — Z794 Long term (current) use of insulin: Secondary | ICD-10-CM | POA: Diagnosis not present

## 2017-04-30 DIAGNOSIS — H2513 Age-related nuclear cataract, bilateral: Secondary | ICD-10-CM | POA: Diagnosis not present

## 2017-05-06 DIAGNOSIS — N183 Chronic kidney disease, stage 3 (moderate): Secondary | ICD-10-CM | POA: Diagnosis not present

## 2017-05-07 DIAGNOSIS — E7800 Pure hypercholesterolemia, unspecified: Secondary | ICD-10-CM | POA: Diagnosis not present

## 2017-05-07 DIAGNOSIS — I10 Essential (primary) hypertension: Secondary | ICD-10-CM | POA: Diagnosis not present

## 2017-05-07 DIAGNOSIS — G4733 Obstructive sleep apnea (adult) (pediatric): Secondary | ICD-10-CM | POA: Diagnosis not present

## 2017-05-12 DIAGNOSIS — H2512 Age-related nuclear cataract, left eye: Secondary | ICD-10-CM | POA: Diagnosis not present

## 2017-05-15 ENCOUNTER — Encounter: Payer: Self-pay | Admitting: *Deleted

## 2017-05-20 ENCOUNTER — Ambulatory Visit: Payer: PPO | Admitting: Anesthesiology

## 2017-05-20 ENCOUNTER — Ambulatory Visit
Admission: RE | Admit: 2017-05-20 | Discharge: 2017-05-20 | Disposition: A | Discharge status: Home / Self Care | Payer: PPO | Source: Ambulatory Visit | Attending: Ophthalmology | Admitting: Ophthalmology

## 2017-05-20 ENCOUNTER — Encounter
Admission: RE | Disposition: A | Discharge status: Home / Self Care | Payer: Self-pay | Source: Ambulatory Visit | Attending: Ophthalmology

## 2017-05-20 DIAGNOSIS — E1040 Type 1 diabetes mellitus with diabetic neuropathy, unspecified: Secondary | ICD-10-CM | POA: Diagnosis not present

## 2017-05-20 DIAGNOSIS — Z885 Allergy status to narcotic agent status: Secondary | ICD-10-CM | POA: Insufficient documentation

## 2017-05-20 DIAGNOSIS — H2512 Age-related nuclear cataract, left eye: Secondary | ICD-10-CM | POA: Insufficient documentation

## 2017-05-20 DIAGNOSIS — Z881 Allergy status to other antibiotic agents status: Secondary | ICD-10-CM | POA: Diagnosis not present

## 2017-05-20 DIAGNOSIS — Z6841 Body Mass Index (BMI) 40.0 and over, adult: Secondary | ICD-10-CM | POA: Diagnosis not present

## 2017-05-20 DIAGNOSIS — Z85828 Personal history of other malignant neoplasm of skin: Secondary | ICD-10-CM | POA: Insufficient documentation

## 2017-05-20 DIAGNOSIS — Z7982 Long term (current) use of aspirin: Secondary | ICD-10-CM | POA: Insufficient documentation

## 2017-05-20 DIAGNOSIS — Z794 Long term (current) use of insulin: Secondary | ICD-10-CM | POA: Insufficient documentation

## 2017-05-20 DIAGNOSIS — E109 Type 1 diabetes mellitus without complications: Secondary | ICD-10-CM | POA: Diagnosis not present

## 2017-05-20 DIAGNOSIS — E7800 Pure hypercholesterolemia, unspecified: Secondary | ICD-10-CM | POA: Insufficient documentation

## 2017-05-20 DIAGNOSIS — Z9049 Acquired absence of other specified parts of digestive tract: Secondary | ICD-10-CM | POA: Insufficient documentation

## 2017-05-20 DIAGNOSIS — I10 Essential (primary) hypertension: Secondary | ICD-10-CM | POA: Insufficient documentation

## 2017-05-20 DIAGNOSIS — Z7951 Long term (current) use of inhaled steroids: Secondary | ICD-10-CM | POA: Insufficient documentation

## 2017-05-20 DIAGNOSIS — Z9889 Other specified postprocedural states: Secondary | ICD-10-CM | POA: Insufficient documentation

## 2017-05-20 DIAGNOSIS — G4730 Sleep apnea, unspecified: Secondary | ICD-10-CM | POA: Diagnosis not present

## 2017-05-20 DIAGNOSIS — Z79899 Other long term (current) drug therapy: Secondary | ICD-10-CM | POA: Diagnosis not present

## 2017-05-20 HISTORY — DX: Polyneuropathy, unspecified: G62.9

## 2017-05-20 HISTORY — DX: Sleep apnea, unspecified: G47.30

## 2017-05-20 HISTORY — PX: CATARACT EXTRACTION W/PHACO: SHX586

## 2017-05-20 HISTORY — DX: Obesity, unspecified: E66.9

## 2017-05-20 HISTORY — DX: Bell's palsy: G51.0

## 2017-05-20 HISTORY — DX: Dyspnea, unspecified: R06.00

## 2017-05-20 HISTORY — DX: Malignant (primary) neoplasm, unspecified: C80.1

## 2017-05-20 LAB — GLUCOSE, CAPILLARY: GLUCOSE-CAPILLARY: 216 mg/dL — AB (ref 65–99)

## 2017-05-20 SURGERY — PHACOEMULSIFICATION, CATARACT, WITH IOL INSERTION
Anesthesia: Monitor Anesthesia Care | Site: Eye | Laterality: Left | Wound class: Clean

## 2017-05-20 MED ORDER — MOXIFLOXACIN HCL 0.5 % OP SOLN: 1.0000 [drp] | OPHTHALMIC | Status: DC | PRN

## 2017-05-20 MED ORDER — POVIDONE-IODINE 5 % OP SOLN
OPHTHALMIC | Status: AC
  Filled 2017-05-20: qty 30

## 2017-05-20 MED ORDER — LIDOCAINE HCL (PF) 4 % IJ SOLN
INTRAMUSCULAR | Status: AC
  Filled 2017-05-20: qty 5

## 2017-05-20 MED ORDER — ONDANSETRON HCL 4 MG/2ML IJ SOLN: 4.0000 mg | Freq: Once | INTRAMUSCULAR | Status: DC | PRN

## 2017-05-20 MED ORDER — EPINEPHRINE PF 1 MG/ML IJ SOLN
INTRAMUSCULAR | Status: AC
  Filled 2017-05-20: qty 2

## 2017-05-20 MED ORDER — POVIDONE-IODINE 5 % OP SOLN
OPHTHALMIC | Status: DC | PRN
  Administered 2017-05-20: 1 via OPHTHALMIC

## 2017-05-20 MED ORDER — ARMC OPHTHALMIC DILATING DROPS
1.0000 "application " | OPHTHALMIC | Status: AC
  Administered 2017-05-20 (×3): 1 via OPHTHALMIC

## 2017-05-20 MED ORDER — FENTANYL CITRATE (PF) 100 MCG/2ML IJ SOLN: 25.0000 ug | INTRAMUSCULAR | Status: DC | PRN

## 2017-05-20 MED ORDER — ARMC OPHTHALMIC DILATING DROPS
OPHTHALMIC | Status: AC
  Administered 2017-05-20: 1 via OPHTHALMIC
  Filled 2017-05-20: qty 0.4

## 2017-05-20 MED ORDER — NA CHONDROIT SULF-NA HYALURON 40-17 MG/ML IO SOLN
INTRAOCULAR | Status: AC
  Filled 2017-05-20: qty 1

## 2017-05-20 MED ORDER — SODIUM CHLORIDE 0.9 % IV SOLN
INTRAVENOUS | Status: DC
  Administered 2017-05-20: 09:00:00 via INTRAVENOUS

## 2017-05-20 MED ORDER — LIDOCAINE HCL (PF) 4 % IJ SOLN
INTRAOCULAR | Status: DC | PRN
  Administered 2017-05-20: 4 mL via OPHTHALMIC

## 2017-05-20 MED ORDER — MOXIFLOXACIN HCL 0.5 % OP SOLN
OPHTHALMIC | Status: DC | PRN
  Administered 2017-05-20: 0.2 mL via OPHTHALMIC

## 2017-05-20 MED ORDER — FENTANYL CITRATE (PF) 100 MCG/2ML IJ SOLN
INTRAMUSCULAR | Status: DC | PRN
  Administered 2017-05-20 (×2): 25 ug via INTRAVENOUS

## 2017-05-20 MED ORDER — NA CHONDROIT SULF-NA HYALURON 40-17 MG/ML IO SOLN
INTRAOCULAR | Status: DC | PRN
  Administered 2017-05-20: 1 mL via INTRAOCULAR

## 2017-05-20 MED ORDER — EPINEPHRINE PF 1 MG/ML IJ SOLN
INTRAOCULAR | Status: DC | PRN
  Administered 2017-05-20: 10:00:00 via OPHTHALMIC

## 2017-05-20 MED ORDER — MIDAZOLAM HCL 2 MG/2ML IJ SOLN
INTRAMUSCULAR | Status: DC | PRN
  Administered 2017-05-20: 1 mg via INTRAVENOUS

## 2017-05-20 MED ORDER — MOXIFLOXACIN HCL 0.5 % OP SOLN
OPHTHALMIC | Status: AC
  Filled 2017-05-20: qty 3

## 2017-05-20 MED ORDER — FENTANYL CITRATE (PF) 100 MCG/2ML IJ SOLN
INTRAMUSCULAR | Status: AC
  Filled 2017-05-20: qty 2

## 2017-05-20 MED ORDER — MIDAZOLAM HCL 2 MG/2ML IJ SOLN
INTRAMUSCULAR | Status: AC
  Filled 2017-05-20: qty 2

## 2017-05-20 MED ORDER — CARBACHOL 0.01 % IO SOLN
INTRAOCULAR | Status: DC | PRN
  Administered 2017-05-20: 0.5 mL via INTRAOCULAR

## 2017-05-20 SURGICAL SUPPLY — 18 items
GLOVE BIO SURGEON STRL SZ8 (GLOVE) ×3 IMPLANT
GLOVE BIOGEL M 6.5 STRL (GLOVE) ×3 IMPLANT
GLOVE SURG LX 8.0 MICRO (GLOVE) ×2
GLOVE SURG LX STRL 8.0 MICRO (GLOVE) ×1 IMPLANT
GOWN STRL REUS W/ TWL LRG LVL3 (GOWN DISPOSABLE) ×2 IMPLANT
GOWN STRL REUS W/TWL LRG LVL3 (GOWN DISPOSABLE) ×4
LABEL CATARACT MEDS ST (LABEL) ×3 IMPLANT
LENS IOL ACRSF IQ TRC 3 15.0 ×1 IMPLANT
LENS IOL ACRYSOF IQ TORIC 15.0 ×2 IMPLANT
LENS IOL IQ TORIC 3 15.0 ×1 IMPLANT
PACK CATARACT (MISCELLANEOUS) ×3 IMPLANT
PACK CATARACT BRASINGTON LX (MISCELLANEOUS) ×3 IMPLANT
PACK EYE AFTER SURG (MISCELLANEOUS) ×3 IMPLANT
SOL BSS BAG (MISCELLANEOUS) ×3
SOLUTION BSS BAG (MISCELLANEOUS) ×1 IMPLANT
SYR 5ML LL (SYRINGE) ×3 IMPLANT
WATER STERILE IRR 250ML POUR (IV SOLUTION) ×3 IMPLANT
WIPE NON LINTING 3.25X3.25 (MISCELLANEOUS) ×3 IMPLANT

## 2017-05-20 NOTE — Anesthesia Postprocedure Evaluation (Signed)
Anesthesia Post Note  Patient: James Clarke  Procedure(s) Performed: CATARACT EXTRACTION PHACO AND INTRAOCULAR LENS PLACEMENT (IOC) (Left Eye)  Patient location during evaluation: PACU Anesthesia Type: MAC Level of consciousness: awake, awake and alert and oriented Pain management: pain level controlled Vital Signs Assessment: post-procedure vital signs reviewed and stable Respiratory status: spontaneous breathing, nonlabored ventilation and respiratory function stable Cardiovascular status: stable Anesthetic complications: no     Last Vitals:  Vitals:   05/20/17 1021 05/20/17 1022  BP: 135/63 135/63  Pulse: 70 65  Resp: 18 17  Temp:  37 C  SpO2: 98% 98%    Last Pain:  Vitals:   05/20/17 0828  TempSrc: Tympanic                 Lance Muss

## 2017-05-20 NOTE — Op Note (Signed)
PREOPERATIVE DIAGNOSIS:  Nuclear sclerotic cataract of the left eye.   POSTOPERATIVE DIAGNOSIS:  NUCLEAR SCLEROTIC CATARACT LEFT EYE   OPERATIVE PROCEDURE:  Procedure(s): CATARACT EXTRACTION PHACO AND INTRAOCULAR LENS PLACEMENT (IOC)   SURGEON:  Birder Robson, MD.   ANESTHESIA: 1.      Managed anesthesia care. 2.      Topical tetracaine drops followed by 2% Xylocaine jelly applied in the preoperative holding area.  Anesthesiologist: [place] [facility], Jane Canary, MD CRNA: Lance Muss, CRNA   COMPLICATIONS:  None.   TECHNIQUE:   Stop and chop    DESCRIPTION OF PROCEDURE:  The patient was examined and consented in the preoperative holding area where the aforementioned topical anesthesia was applied to the left eye.  The patient was brought back to the Operating Room where he was sat upright on the gurney and given a target to fixate upon while the eye was marked at the 3:00 and 9:00 position.  The patient was then reclined on the operating table.  The eye was prepped and draped in the usual sterile ophthalmic fashion and a lid speculum was placed. A paracentesis was created with the side port blade and the anterior chamber was filled with viscoelastic. A near clear corneal incision was performed with the steel keratome. A continuous curvilinear capsulorrhexis was performed with a cystotome followed by the capsulorrhexis forceps. Hydrodissection and hydrodelineation were carried out with BSS on a blunt cannula. The lens was removed in a stop and chop technique and the remaining cortical material was removed with the irrigation-aspiration handpiece. The eye was inflated with viscoelastic and the ZCT lens  was placed in the eye and rotated to within a few degrees of the predetermined orientation.  The remaining viscoelastic was removed from the eye.  The Sinskey hook was used to rotate the toric lens into its final resting place at 095 degrees.  0.1 mL of cefuroxime concentration 10 mg/mL was placed  in the anterior chamber. The eye was inflated to a physiologic pressure and found to be watertight.  The eye was dressed with Vigamox. The patient was given protective glasses to wear throughout the day and a shield with which to sleep tonight. The patient was also given drops with which to begin a drop regimen today and will follow-up with me in one day.  Implant Name Type Inv. Item Serial No. Manufacturer Lot No. LRB No. Used  LENS IOL TORIC 15.0 - [id number]   LENS IOL TORIC 15.0 [id number] ALCON   Left 1   Procedure(s) with comments: CATARACT EXTRACTION PHACO AND INTRAOCULAR LENS PLACEMENT (IOC) (Left) - [place] 00:32.0 AP% 15.5 CDE 4.97 Fluid Pack Lot # [id number]  Electronically signed: Lee Vining 05/20/2017 10:19 AM

## 2017-05-20 NOTE — Transfer of Care (Signed)
Immediate Anesthesia Transfer of Care Note  Patient: James Clarke [name]  Procedure(s) Performed: CATARACT EXTRACTION PHACO AND INTRAOCULAR LENS PLACEMENT (IOC) (Left Eye)  Patient Location: PACU  Anesthesia Type:MAC  Level of Consciousness: awake, alert  and oriented  Airway & Oxygen Therapy: Patient Spontanous Breathing  Post-op Assessment: Report given to RN and Post -op Vital signs reviewed and stable  Post vital signs: Reviewed and stable  Last Vitals:  Vitals:   05/20/17 0828 05/20/17 1021  BP: (!) 171/48 135/63  Pulse: 76 70  Resp: (!) 21 18  Temp: (!) 35.6 C   SpO2: 99% 98%    Last Pain:  Vitals:   05/20/17 0828  TempSrc: Tympanic         Complications: No apparent anesthesia complications

## 2017-05-20 NOTE — Discharge Instructions (Signed)
Eye Surgery Discharge Instructions  Expect mild scratchy sensation or mild soreness. DO NOT RUB YOUR EYE!  The day of surgery:  Minimal physical activity, but bed rest is not required  No reading, computer work, or close hand work  No bending, lifting, or straining.  May watch TV  For 24 hours:  No driving, legal decisions, or alcoholic beverages  Safety precautions  Eat anything you prefer: It is better to start with liquids, then soup then solid foods.  _____ Eye patch should be worn until postoperative exam tomorrow.  _x___ Solar shield eyeglasses should be worn for comfort in the sunlight/patch while sleeping  Resume all regular medications including aspirin or Coumadin if these were discontinued prior to surgery. You may shower, bathe, shave, or wash your hair. Tylenol may be taken for mild discomfort.  Call your doctor if you experience significant pain, nausea, or vomiting, fever > 101 or other signs of infection. [phone] or [phone] Specific instructions:  Follow-up Information    Birder Robson, MD Follow up on 05/21/2017.   Specialty:  Ophthalmology Why:  Follow up appointment at 1:55 pm in the office Contact information: [place] [address] [phone]

## 2017-05-20 NOTE — Anesthesia Post-op Follow-up Note (Signed)
Anesthesia QCDR form completed.        

## 2017-05-20 NOTE — H&P (Signed)
All labs reviewed. Abnormal studies sent to patients PCP when indicated.  Previous H&P reviewed, patient examined, there are NO CHANGES.  James Clarke LOUIS10/30/20189:42 AM

## 2017-05-20 NOTE — Anesthesia Preprocedure Evaluation (Signed)
Anesthesia Evaluation  Patient identified by MRN, date of birth, ID band Patient awake    Reviewed: Allergy & Precautions, NPO status , Patient's Chart, lab work & pertinent test results  Airway Mallampati: II       Dental  (+) Teeth Intact   Pulmonary sleep apnea and Continuous Positive Airway Pressure Ventilation ,     + decreased breath sounds      Cardiovascular Exercise Tolerance: Good hypertension, Pt. on medications and Pt. on home beta blockers  Rhythm:Regular     Neuro/Psych negative psych ROS   GI/Hepatic negative GI ROS, Neg liver ROS,   Endo/Other  diabetes, Type 1, Insulin DependentMorbid obesity  Renal/GU      Musculoskeletal negative musculoskeletal ROS (+)   Abdominal (+) + obese,   Peds negative pediatric ROS (+)  Hematology negative hematology ROS (+)   Anesthesia Other Findings   Reproductive/Obstetrics                             Anesthesia Physical Anesthesia Plan  ASA: III  Anesthesia Plan: MAC   Post-op Pain Management:    Induction: Intravenous  PONV Risk Score and Plan:   Airway Management Planned: Natural Airway and Nasal Cannula  Additional Equipment:   Intra-op Plan:   Post-operative Plan:   Informed Consent: I have reviewed the patients History and Physical, chart, labs and discussed the procedure including the risks, benefits and alternatives for the proposed anesthesia with the patient or authorized representative who has indicated his/her understanding and acceptance.     Plan Discussed with: CRNA  Anesthesia Plan Comments:         Anesthesia Quick Evaluation

## 2017-05-20 NOTE — Anesthesia Procedure Notes (Signed)
Procedure Name: MAC Performed by: Lance Muss Pre-anesthesia Checklist: Patient identified, Emergency Drugs available, Suction available, Patient being monitored and Timeout performed Oxygen Delivery Method: Nasal cannula

## 2017-05-28 DIAGNOSIS — R600 Localized edema: Secondary | ICD-10-CM | POA: Diagnosis not present

## 2017-05-28 DIAGNOSIS — N183 Chronic kidney disease, stage 3 (moderate): Secondary | ICD-10-CM | POA: Diagnosis not present

## 2017-05-28 DIAGNOSIS — R809 Proteinuria, unspecified: Secondary | ICD-10-CM | POA: Diagnosis not present

## 2017-05-28 DIAGNOSIS — N2581 Secondary hyperparathyroidism of renal origin: Secondary | ICD-10-CM | POA: Diagnosis not present

## 2017-05-28 DIAGNOSIS — I10 Essential (primary) hypertension: Secondary | ICD-10-CM | POA: Diagnosis not present

## 2017-05-28 DIAGNOSIS — E1122 Type 2 diabetes mellitus with diabetic chronic kidney disease: Secondary | ICD-10-CM | POA: Diagnosis not present

## 2017-06-02 DIAGNOSIS — H2511 Age-related nuclear cataract, right eye: Secondary | ICD-10-CM | POA: Diagnosis not present

## 2017-06-05 ENCOUNTER — Encounter: Payer: Self-pay | Admitting: *Deleted

## 2017-06-10 ENCOUNTER — Other Ambulatory Visit: Payer: Self-pay

## 2017-06-10 ENCOUNTER — Ambulatory Visit: Payer: PPO | Admitting: Anesthesiology

## 2017-06-10 ENCOUNTER — Encounter
Admission: RE | Disposition: A | Discharge status: Home / Self Care | Payer: Self-pay | Source: Ambulatory Visit | Attending: Ophthalmology

## 2017-06-10 ENCOUNTER — Ambulatory Visit
Admission: RE | Admit: 2017-06-10 | Discharge: 2017-06-10 | Disposition: A | Discharge status: Home / Self Care | Payer: PPO | Source: Ambulatory Visit | Attending: Ophthalmology | Admitting: Ophthalmology

## 2017-06-10 DIAGNOSIS — Z6841 Body Mass Index (BMI) 40.0 and over, adult: Secondary | ICD-10-CM | POA: Insufficient documentation

## 2017-06-10 DIAGNOSIS — G4730 Sleep apnea, unspecified: Secondary | ICD-10-CM | POA: Insufficient documentation

## 2017-06-10 DIAGNOSIS — Z881 Allergy status to other antibiotic agents status: Secondary | ICD-10-CM | POA: Insufficient documentation

## 2017-06-10 DIAGNOSIS — Z794 Long term (current) use of insulin: Secondary | ICD-10-CM | POA: Insufficient documentation

## 2017-06-10 DIAGNOSIS — Z85828 Personal history of other malignant neoplasm of skin: Secondary | ICD-10-CM | POA: Insufficient documentation

## 2017-06-10 DIAGNOSIS — E119 Type 2 diabetes mellitus without complications: Secondary | ICD-10-CM | POA: Diagnosis not present

## 2017-06-10 DIAGNOSIS — E1140 Type 2 diabetes mellitus with diabetic neuropathy, unspecified: Secondary | ICD-10-CM | POA: Insufficient documentation

## 2017-06-10 DIAGNOSIS — E7800 Pure hypercholesterolemia, unspecified: Secondary | ICD-10-CM | POA: Insufficient documentation

## 2017-06-10 DIAGNOSIS — H2511 Age-related nuclear cataract, right eye: Secondary | ICD-10-CM | POA: Diagnosis not present

## 2017-06-10 DIAGNOSIS — Z885 Allergy status to narcotic agent status: Secondary | ICD-10-CM | POA: Insufficient documentation

## 2017-06-10 DIAGNOSIS — E669 Obesity, unspecified: Secondary | ICD-10-CM | POA: Insufficient documentation

## 2017-06-10 DIAGNOSIS — Z9989 Dependence on other enabling machines and devices: Secondary | ICD-10-CM | POA: Insufficient documentation

## 2017-06-10 DIAGNOSIS — I10 Essential (primary) hypertension: Secondary | ICD-10-CM | POA: Insufficient documentation

## 2017-06-10 HISTORY — PX: CATARACT EXTRACTION W/PHACO: SHX586

## 2017-06-10 LAB — GLUCOSE, CAPILLARY: GLUCOSE-CAPILLARY: 207 mg/dL — AB (ref 65–99)

## 2017-06-10 SURGERY — PHACOEMULSIFICATION, CATARACT, WITH IOL INSERTION
Anesthesia: Monitor Anesthesia Care | Site: Eye | Laterality: Right | Wound class: Clean

## 2017-06-10 MED ORDER — POVIDONE-IODINE 5 % OP SOLN
OPHTHALMIC | Status: DC | PRN
  Administered 2017-06-10: 1 via OPHTHALMIC

## 2017-06-10 MED ORDER — SODIUM CHLORIDE 0.9 % IV SOLN
INTRAVENOUS | Status: DC
  Administered 2017-06-10: 08:00:00 via INTRAVENOUS

## 2017-06-10 MED ORDER — ARMC OPHTHALMIC DILATING DROPS
OPHTHALMIC | Status: AC
  Administered 2017-06-10: 1 via OPHTHALMIC
  Filled 2017-06-10: qty 0.4

## 2017-06-10 MED ORDER — ARMC OPHTHALMIC DILATING DROPS
1.0000 "application " | OPHTHALMIC | Status: AC
  Administered 2017-06-10 (×3): 1 via OPHTHALMIC

## 2017-06-10 MED ORDER — LIDOCAINE HCL (PF) 4 % IJ SOLN
INTRAMUSCULAR | Status: DC | PRN
  Administered 2017-06-10: 2 mL via OPHTHALMIC

## 2017-06-10 MED ORDER — FENTANYL CITRATE (PF) 100 MCG/2ML IJ SOLN
INTRAMUSCULAR | Status: DC | PRN
Start: 2017-06-10 — End: 2017-06-10
  Administered 2017-06-10: 25 ug via INTRAVENOUS
  Administered 2017-06-10: 50 ug via INTRAVENOUS
  Administered 2017-06-10: 25 ug via INTRAVENOUS

## 2017-06-10 MED ORDER — MOXIFLOXACIN HCL 0.5 % OP SOLN
OPHTHALMIC | Status: AC
  Filled 2017-06-10: qty 3

## 2017-06-10 MED ORDER — EPINEPHRINE PF 1 MG/ML IJ SOLN
INTRAOCULAR | Status: DC | PRN
  Administered 2017-06-10: 1 mL via OPHTHALMIC

## 2017-06-10 MED ORDER — FENTANYL CITRATE (PF) 100 MCG/2ML IJ SOLN
INTRAMUSCULAR | Status: AC
  Filled 2017-06-10: qty 2

## 2017-06-10 MED ORDER — MOXIFLOXACIN HCL 0.5 % OP SOLN
OPHTHALMIC | Status: DC | PRN
  Administered 2017-06-10: .2 mL via OPHTHALMIC

## 2017-06-10 MED ORDER — CARBACHOL 0.01 % IO SOLN
INTRAOCULAR | Status: DC | PRN
  Administered 2017-06-10: .5 mL via INTRAOCULAR

## 2017-06-10 MED ORDER — NA CHONDROIT SULF-NA HYALURON 40-17 MG/ML IO SOLN
INTRAOCULAR | Status: DC | PRN
  Administered 2017-06-10: 1 mL via INTRAOCULAR

## 2017-06-10 MED ORDER — MOXIFLOXACIN HCL 0.5 % OP SOLN: 1.0000 [drp] | OPHTHALMIC | Status: DC | PRN

## 2017-06-10 SURGICAL SUPPLY — 18 items
GLOVE BIO SURGEON STRL SZ8 (GLOVE) ×3 IMPLANT
GLOVE BIOGEL M 6.5 STRL (GLOVE) ×3 IMPLANT
GLOVE SURG LX 8.0 MICRO (GLOVE) ×2
GLOVE SURG LX STRL 8.0 MICRO (GLOVE) ×1 IMPLANT
GOWN STRL REUS W/ TWL LRG LVL3 (GOWN DISPOSABLE) ×2 IMPLANT
GOWN STRL REUS W/TWL LRG LVL3 (GOWN DISPOSABLE) ×4
LABEL CATARACT MEDS ST (LABEL) ×3 IMPLANT
LENS IOL ACRSF IQ TRC 3 15.0 ×1 IMPLANT
LENS IOL ACRYSOF IQ TORIC 15.0 ×2 IMPLANT
LENS IOL IQ TORIC 3 15.0 ×1 IMPLANT
PACK CATARACT (MISCELLANEOUS) ×3 IMPLANT
PACK CATARACT BRASINGTON LX (MISCELLANEOUS) ×3 IMPLANT
PACK EYE AFTER SURG (MISCELLANEOUS) ×3 IMPLANT
SOL BSS BAG (MISCELLANEOUS) ×3
SOLUTION BSS BAG (MISCELLANEOUS) ×1 IMPLANT
SYR 5ML LL (SYRINGE) ×3 IMPLANT
WATER STERILE IRR 250ML POUR (IV SOLUTION) ×3 IMPLANT
WIPE NON LINTING 3.25X3.25 (MISCELLANEOUS) ×3 IMPLANT

## 2017-06-10 NOTE — Op Note (Signed)
PREOPERATIVE DIAGNOSIS:  Nuclear sclerotic cataract of the right eye.   POSTOPERATIVE DIAGNOSIS:  Nuclear sclerotic cataract of the right eye.   OPERATIVE PROCEDURE: Procedure(s): CATARACT EXTRACTION PHACO AND INTRAOCULAR LENS PLACEMENT (IOC)   SURGEON:  Birder Robson, MD.   ANESTHESIA: 1.      Managed anesthesia care. 2.     0.35ml of Shugarcaine was instilled following the paracentesis  Anesthesiologist: Emmie Niemann, MD CRNA: Jonna Clark, CRNA  COMPLICATIONS:  None.   TECHNIQUE:   Stop and chop    DESCRIPTION OF PROCEDURE:  The patient was examined and consented in the preoperative holding area where the aforementioned topical anesthesia was applied to the right eye.  The patient was brought back to the Operating Room where he was sat upright on the gurney and given a target to fixate upon while the eye was marked at the 3:00 and 9:00 position.  The patient was then reclined on the operating table.  The eye was prepped and draped in the usual sterile ophthalmic fashion and a lid speculum was placed. A paracentesis was created with the side port blade and the anterior chamber was filled with viscoelastic. A near clear corneal incision was performed with the steel keratome. A continuous curvilinear capsulorrhexis was performed with a cystotome followed by the capsulorrhexis forceps. Hydrodissection and hydrodelineation were carried out with BSS on a blunt cannula. The lens was removed in a stop and chop technique and the remaining cortical material was removed with the irrigation-aspiration handpiece. The eye was inflated with viscoelastic and the ZCT  lens  was placed in the eye and rotated to within a few degrees of the predetermined orientation.  The remaining viscoelastic was removed from the eye.  The Sinskey hook was used to rotate the toric lens into its final resting place at 032 degrees.  0. The eye was inflated to a physiologic pressure and found to be watertight. 0.27ml of  Vigamox was placed in the anterior chamber.  The eye was dressed with Vigamox. The patient was given protective glasses to wear throughout the day and a shield with which to sleep tonight. The patient was also given drops with which to begin a drop regimen today and will follow-up with me in one day. Implant Name Type Inv. Item Serial No. Manufacturer Lot No. LRB No. Used  LENS IOL TORIC 15.0 - [id number]  LENS IOL TORIC 15.0 [id number] ALCON  Right 1   Procedure(s) with comments: CATARACT EXTRACTION PHACO AND INTRAOCULAR LENS PLACEMENT (IOC) (Right) - [place]  00:51 AP% 15.4 CDE 7.95 Fluid pack lot # [id number]  Electronically signed: Buster Schueller LOUIS 06/10/2017 9:51 AM

## 2017-06-10 NOTE — Anesthesia Post-op Follow-up Note (Signed)
Anesthesia QCDR form completed.        

## 2017-06-10 NOTE — H&P (Signed)
All labs reviewed. Abnormal studies sent to patients PCP when indicated.  Previous H&P reviewed, patient examined, there are NO CHANGES.  James Clarke LOUIS11/20/20189:15 AM

## 2017-06-10 NOTE — Discharge Instructions (Signed)
Eye Surgery Discharge Instructions  Expect mild scratchy sensation or mild soreness. DO NOT RUB YOUR EYE!  The day of surgery:  Minimal physical activity, but bed rest is not required  No reading, computer work, or close hand work  No bending, lifting, or straining.  May watch TV  For 24 hours:  No driving, legal decisions, or alcoholic beverages  Safety precautions  Eat anything you prefer: It is better to start with liquids, then soup then solid foods.  _____ Eye patch should be worn until postoperative exam tomorrow.  ____ Solar shield eyeglasses should be worn for comfort in the sunlight/patch while sleeping  Resume all regular medications including aspirin or Coumadin if these were discontinued prior to surgery. You may shower, bathe, shave, or wash your hair. Tylenol may be taken for mild discomfort.  Call your doctor if you experience significant pain, nausea, or vomiting, fever > 101 or other signs of infection. [phone] or [phone] Specific instructions:  Follow-up Information    Birder Robson, MD Follow up.   Specialty:  Ophthalmology Why:  November 21 at 9:25am Contact information: [address] [place] [address] [phone]

## 2017-06-10 NOTE — Anesthesia Preprocedure Evaluation (Signed)
Anesthesia Evaluation  Patient identified by MRN, date of birth, ID band Patient awake    Reviewed: Allergy & Precautions, NPO status , Patient's Chart, lab work & pertinent test results  History of Anesthesia Complications Negative for: history of anesthetic complications  Airway Mallampati: II  TM Distance: >3 FB Neck ROM: Full    Dental no notable dental hx.    Pulmonary neg pulmonary ROS, sleep apnea and Continuous Positive Airway Pressure Ventilation , neg COPD,    breath sounds clear to auscultation- rhonchi (-) wheezing      Cardiovascular hypertension, Pt. on medications (-) CAD, (-) Past MI and (-) Cardiac Stents  Rhythm:Regular Rate:Normal - Systolic murmurs and - Diastolic murmurs    Neuro/Psych negative neurological ROS  negative psych ROS   GI/Hepatic negative GI ROS, Neg liver ROS,   Endo/Other  diabetes, Insulin Dependent  Renal/GU negative Renal ROS     Musculoskeletal negative musculoskeletal ROS (+)   Abdominal (+) + obese,   Peds  Hematology negative hematology ROS (+)   Anesthesia Other Findings Past Medical History: No date: Bell's palsy     Comment:  SEVERAL TIMES No date: Bell's palsy No date: Cancer (HCC)     Comment:  SKIN No date: Diabetes mellitus without complication (HCC) No date: Dyspnea     Comment:  DOE No date: Hyperlipemia No date: Hypertension No date: Neuropathy No date: Obesity No date: Sleep apnea     Comment:  C PAP   Reproductive/Obstetrics                             Anesthesia Physical Anesthesia Plan  ASA: III  Anesthesia Plan: MAC   Post-op Pain Management:    Induction: Intravenous  PONV Risk Score and Plan: 1 and Midazolam  Airway Management Planned: Natural Airway  Additional Equipment:   Intra-op Plan:   Post-operative Plan:   Informed Consent: I have reviewed the patients History and Physical, chart, labs and  discussed the procedure including the risks, benefits and alternatives for the proposed anesthesia with the patient or authorized representative who has indicated his/her understanding and acceptance.     Plan Discussed with: CRNA and Anesthesiologist  Anesthesia Plan Comments:         Anesthesia Quick Evaluation

## 2017-06-10 NOTE — Anesthesia Postprocedure Evaluation (Signed)
Anesthesia Post Note  Patient: James Clarke  Procedure(s) Performed: CATARACT EXTRACTION PHACO AND INTRAOCULAR LENS PLACEMENT (IOC) (Right Eye)  Patient location during evaluation: Short Stay Anesthesia Type: MAC Level of consciousness: awake and alert and oriented Pain management: pain level controlled Vital Signs Assessment: post-procedure vital signs reviewed and stable Respiratory status: spontaneous breathing Cardiovascular status: stable Postop Assessment: no headache, no apparent nausea or vomiting and adequate PO intake Anesthetic complications: no     Last Vitals:  Vitals:   06/10/17 0948 06/10/17 0958  BP: (!) 140/57 140/67  Pulse: 72 69  Resp: 16   Temp:    SpO2: 96% 98%    Last Pain:  Vitals:   06/10/17 0945  TempSrc: Temporal                 [name]

## 2017-06-10 NOTE — Transfer of Care (Signed)
Immediate Anesthesia Transfer of Care Note  Patient: James Clarke  Procedure(s) Performed: CATARACT EXTRACTION PHACO AND INTRAOCULAR LENS PLACEMENT (IOC) (Right Eye)  Patient Location: PACU and Short Stay  Anesthesia Type:MAC  Level of Consciousness: awake, alert  and oriented  Airway & Oxygen Therapy: Patient Spontanous Breathing  Post-op Assessment: Report given to RN and Post -op Vital signs reviewed and stable  Post vital signs: Reviewed and stable  Last Vitals:  Vitals:   06/10/17 0758 06/10/17 0948  BP:  (!) 140/57  Pulse:  72  Resp:  16  Temp: 37 C   SpO2:  96%    Last Pain:  Vitals:   06/10/17 0757  TempSrc: Core (Comment)         Complications: No apparent anesthesia complications

## 2017-06-10 NOTE — Anesthesia Postprocedure Evaluation (Signed)
Anesthesia Post Note  Patient: James Clarke  Procedure(s) Performed: CATARACT EXTRACTION PHACO AND INTRAOCULAR LENS PLACEMENT (IOC) (Right Eye)  Patient location during evaluation: PACU Anesthesia Type: MAC Level of consciousness: awake and alert and oriented Pain management: pain level controlled Vital Signs Assessment: post-procedure vital signs reviewed and stable Respiratory status: spontaneous breathing, nonlabored ventilation and respiratory function stable Cardiovascular status: blood pressure returned to baseline and stable Postop Assessment: no signs of nausea or vomiting Anesthetic complications: no     Last Vitals:  Vitals:   06/10/17 0948 06/10/17 0958  BP: (!) 140/57 140/67  Pulse: 72 69  Resp: 16   Temp:    SpO2: 96% 98%    Last Pain:  Vitals:   06/10/17 0945  TempSrc: Temporal                 Rebekah Zackery

## 2017-06-11 DIAGNOSIS — E7800 Pure hypercholesterolemia, unspecified: Secondary | ICD-10-CM | POA: Diagnosis not present

## 2017-06-11 DIAGNOSIS — E1121 Type 2 diabetes mellitus with diabetic nephropathy: Secondary | ICD-10-CM | POA: Diagnosis not present

## 2017-06-11 DIAGNOSIS — Z794 Long term (current) use of insulin: Secondary | ICD-10-CM | POA: Diagnosis not present

## 2017-06-18 DIAGNOSIS — E1121 Type 2 diabetes mellitus with diabetic nephropathy: Secondary | ICD-10-CM | POA: Diagnosis not present

## 2017-06-18 DIAGNOSIS — N184 Chronic kidney disease, stage 4 (severe): Secondary | ICD-10-CM | POA: Diagnosis not present

## 2017-06-18 DIAGNOSIS — E1122 Type 2 diabetes mellitus with diabetic chronic kidney disease: Secondary | ICD-10-CM | POA: Diagnosis not present

## 2017-06-18 DIAGNOSIS — I10 Essential (primary) hypertension: Secondary | ICD-10-CM | POA: Diagnosis not present

## 2017-06-18 DIAGNOSIS — Z794 Long term (current) use of insulin: Secondary | ICD-10-CM | POA: Diagnosis not present

## 2017-06-18 DIAGNOSIS — E1142 Type 2 diabetes mellitus with diabetic polyneuropathy: Secondary | ICD-10-CM | POA: Diagnosis not present

## 2017-07-11 DIAGNOSIS — E1140 Type 2 diabetes mellitus with diabetic neuropathy, unspecified: Secondary | ICD-10-CM | POA: Diagnosis not present

## 2017-07-11 DIAGNOSIS — Z794 Long term (current) use of insulin: Secondary | ICD-10-CM | POA: Diagnosis not present

## 2017-07-11 DIAGNOSIS — B351 Tinea unguium: Secondary | ICD-10-CM | POA: Diagnosis not present

## 2017-07-18 DIAGNOSIS — H40053 Ocular hypertension, bilateral: Secondary | ICD-10-CM | POA: Diagnosis not present

## 2017-09-02 DIAGNOSIS — I10 Essential (primary) hypertension: Secondary | ICD-10-CM | POA: Diagnosis not present

## 2017-09-02 DIAGNOSIS — E1122 Type 2 diabetes mellitus with diabetic chronic kidney disease: Secondary | ICD-10-CM | POA: Diagnosis not present

## 2017-09-02 DIAGNOSIS — Z794 Long term (current) use of insulin: Secondary | ICD-10-CM | POA: Diagnosis not present

## 2017-09-02 DIAGNOSIS — E7800 Pure hypercholesterolemia, unspecified: Secondary | ICD-10-CM | POA: Diagnosis not present

## 2017-09-02 DIAGNOSIS — N183 Chronic kidney disease, stage 3 (moderate): Secondary | ICD-10-CM | POA: Diagnosis not present

## 2017-09-19 DIAGNOSIS — N2581 Secondary hyperparathyroidism of renal origin: Secondary | ICD-10-CM | POA: Diagnosis not present

## 2017-09-19 DIAGNOSIS — E1122 Type 2 diabetes mellitus with diabetic chronic kidney disease: Secondary | ICD-10-CM | POA: Diagnosis not present

## 2017-09-19 DIAGNOSIS — R600 Localized edema: Secondary | ICD-10-CM | POA: Diagnosis not present

## 2017-09-19 DIAGNOSIS — R809 Proteinuria, unspecified: Secondary | ICD-10-CM | POA: Diagnosis not present

## 2017-09-19 DIAGNOSIS — N183 Chronic kidney disease, stage 3 (moderate): Secondary | ICD-10-CM | POA: Diagnosis not present

## 2017-09-19 DIAGNOSIS — I10 Essential (primary) hypertension: Secondary | ICD-10-CM | POA: Diagnosis not present

## 2017-10-01 DIAGNOSIS — Z794 Long term (current) use of insulin: Secondary | ICD-10-CM | POA: Diagnosis not present

## 2017-10-01 DIAGNOSIS — E1121 Type 2 diabetes mellitus with diabetic nephropathy: Secondary | ICD-10-CM | POA: Diagnosis not present

## 2017-10-08 DIAGNOSIS — Z794 Long term (current) use of insulin: Secondary | ICD-10-CM | POA: Diagnosis not present

## 2017-10-08 DIAGNOSIS — E1121 Type 2 diabetes mellitus with diabetic nephropathy: Secondary | ICD-10-CM | POA: Diagnosis not present

## 2017-10-08 DIAGNOSIS — I10 Essential (primary) hypertension: Secondary | ICD-10-CM | POA: Diagnosis not present

## 2017-10-08 DIAGNOSIS — E1122 Type 2 diabetes mellitus with diabetic chronic kidney disease: Secondary | ICD-10-CM | POA: Diagnosis not present

## 2017-10-08 DIAGNOSIS — N183 Chronic kidney disease, stage 3 (moderate): Secondary | ICD-10-CM | POA: Diagnosis not present

## 2017-10-08 DIAGNOSIS — E1142 Type 2 diabetes mellitus with diabetic polyneuropathy: Secondary | ICD-10-CM | POA: Diagnosis not present

## 2017-10-09 DIAGNOSIS — E1140 Type 2 diabetes mellitus with diabetic neuropathy, unspecified: Secondary | ICD-10-CM | POA: Diagnosis not present

## 2017-10-09 DIAGNOSIS — B351 Tinea unguium: Secondary | ICD-10-CM | POA: Diagnosis not present

## 2017-10-09 DIAGNOSIS — Z794 Long term (current) use of insulin: Secondary | ICD-10-CM | POA: Diagnosis not present

## 2017-10-31 DIAGNOSIS — Z6841 Body Mass Index (BMI) 40.0 and over, adult: Secondary | ICD-10-CM | POA: Diagnosis not present

## 2017-10-31 DIAGNOSIS — N183 Chronic kidney disease, stage 3 (moderate): Secondary | ICD-10-CM | POA: Diagnosis not present

## 2017-10-31 DIAGNOSIS — I10 Essential (primary) hypertension: Secondary | ICD-10-CM | POA: Diagnosis not present

## 2017-10-31 DIAGNOSIS — Z794 Long term (current) use of insulin: Secondary | ICD-10-CM | POA: Diagnosis not present

## 2017-10-31 DIAGNOSIS — E1122 Type 2 diabetes mellitus with diabetic chronic kidney disease: Secondary | ICD-10-CM | POA: Diagnosis not present

## 2017-10-31 DIAGNOSIS — G4733 Obstructive sleep apnea (adult) (pediatric): Secondary | ICD-10-CM | POA: Diagnosis not present

## 2017-10-31 DIAGNOSIS — J301 Allergic rhinitis due to pollen: Secondary | ICD-10-CM | POA: Diagnosis not present

## 2017-10-31 DIAGNOSIS — E7800 Pure hypercholesterolemia, unspecified: Secondary | ICD-10-CM | POA: Diagnosis not present

## 2017-11-11 DIAGNOSIS — M3501 Sicca syndrome with keratoconjunctivitis: Secondary | ICD-10-CM | POA: Diagnosis not present

## 2017-12-01 DIAGNOSIS — G4733 Obstructive sleep apnea (adult) (pediatric): Secondary | ICD-10-CM | POA: Diagnosis not present

## 2017-12-29 DIAGNOSIS — N183 Chronic kidney disease, stage 3 (moderate): Secondary | ICD-10-CM | POA: Diagnosis not present

## 2017-12-29 DIAGNOSIS — E1122 Type 2 diabetes mellitus with diabetic chronic kidney disease: Secondary | ICD-10-CM | POA: Diagnosis not present

## 2017-12-29 DIAGNOSIS — R600 Localized edema: Secondary | ICD-10-CM | POA: Diagnosis not present

## 2017-12-29 DIAGNOSIS — I10 Essential (primary) hypertension: Secondary | ICD-10-CM | POA: Diagnosis not present

## 2017-12-29 DIAGNOSIS — N2581 Secondary hyperparathyroidism of renal origin: Secondary | ICD-10-CM | POA: Diagnosis not present

## 2018-01-05 DIAGNOSIS — Z794 Long term (current) use of insulin: Secondary | ICD-10-CM | POA: Diagnosis not present

## 2018-01-05 DIAGNOSIS — E1121 Type 2 diabetes mellitus with diabetic nephropathy: Secondary | ICD-10-CM | POA: Diagnosis not present

## 2018-01-09 DIAGNOSIS — B351 Tinea unguium: Secondary | ICD-10-CM | POA: Diagnosis not present

## 2018-01-09 DIAGNOSIS — Z794 Long term (current) use of insulin: Secondary | ICD-10-CM | POA: Diagnosis not present

## 2018-01-09 DIAGNOSIS — E1140 Type 2 diabetes mellitus with diabetic neuropathy, unspecified: Secondary | ICD-10-CM | POA: Diagnosis not present

## 2018-01-12 DIAGNOSIS — I10 Essential (primary) hypertension: Secondary | ICD-10-CM | POA: Diagnosis not present

## 2018-01-12 DIAGNOSIS — E1121 Type 2 diabetes mellitus with diabetic nephropathy: Secondary | ICD-10-CM | POA: Diagnosis not present

## 2018-01-12 DIAGNOSIS — N183 Chronic kidney disease, stage 3 (moderate): Secondary | ICD-10-CM | POA: Diagnosis not present

## 2018-01-12 DIAGNOSIS — Z794 Long term (current) use of insulin: Secondary | ICD-10-CM | POA: Diagnosis not present

## 2018-01-12 DIAGNOSIS — E1122 Type 2 diabetes mellitus with diabetic chronic kidney disease: Secondary | ICD-10-CM | POA: Diagnosis not present

## 2018-01-12 DIAGNOSIS — E1142 Type 2 diabetes mellitus with diabetic polyneuropathy: Secondary | ICD-10-CM | POA: Diagnosis not present

## 2018-03-10 DIAGNOSIS — L578 Other skin changes due to chronic exposure to nonionizing radiation: Secondary | ICD-10-CM | POA: Diagnosis not present

## 2018-03-10 DIAGNOSIS — D485 Neoplasm of uncertain behavior of skin: Secondary | ICD-10-CM | POA: Diagnosis not present

## 2018-03-10 DIAGNOSIS — L918 Other hypertrophic disorders of the skin: Secondary | ICD-10-CM | POA: Diagnosis not present

## 2018-03-10 DIAGNOSIS — Z1283 Encounter for screening for malignant neoplasm of skin: Secondary | ICD-10-CM | POA: Diagnosis not present

## 2018-03-10 DIAGNOSIS — Z86018 Personal history of other benign neoplasm: Secondary | ICD-10-CM | POA: Diagnosis not present

## 2018-03-10 DIAGNOSIS — Z872 Personal history of diseases of the skin and subcutaneous tissue: Secondary | ICD-10-CM | POA: Diagnosis not present

## 2018-03-10 DIAGNOSIS — L821 Other seborrheic keratosis: Secondary | ICD-10-CM | POA: Diagnosis not present

## 2018-03-10 DIAGNOSIS — B078 Other viral warts: Secondary | ICD-10-CM | POA: Diagnosis not present

## 2018-03-16 DIAGNOSIS — E119 Type 2 diabetes mellitus without complications: Secondary | ICD-10-CM | POA: Diagnosis not present

## 2018-03-17 DIAGNOSIS — N183 Chronic kidney disease, stage 3 (moderate): Secondary | ICD-10-CM | POA: Diagnosis not present

## 2018-03-17 DIAGNOSIS — R600 Localized edema: Secondary | ICD-10-CM | POA: Diagnosis not present

## 2018-03-17 DIAGNOSIS — E1122 Type 2 diabetes mellitus with diabetic chronic kidney disease: Secondary | ICD-10-CM | POA: Diagnosis not present

## 2018-03-17 DIAGNOSIS — Z794 Long term (current) use of insulin: Secondary | ICD-10-CM | POA: Diagnosis not present

## 2018-03-17 DIAGNOSIS — I10 Essential (primary) hypertension: Secondary | ICD-10-CM | POA: Diagnosis not present

## 2018-03-17 DIAGNOSIS — E7800 Pure hypercholesterolemia, unspecified: Secondary | ICD-10-CM | POA: Diagnosis not present

## 2018-03-17 DIAGNOSIS — G4733 Obstructive sleep apnea (adult) (pediatric): Secondary | ICD-10-CM | POA: Diagnosis not present

## 2018-04-06 DIAGNOSIS — N183 Chronic kidney disease, stage 3 (moderate): Secondary | ICD-10-CM | POA: Diagnosis not present

## 2018-04-06 DIAGNOSIS — N2581 Secondary hyperparathyroidism of renal origin: Secondary | ICD-10-CM | POA: Diagnosis not present

## 2018-04-06 DIAGNOSIS — G4733 Obstructive sleep apnea (adult) (pediatric): Secondary | ICD-10-CM | POA: Diagnosis not present

## 2018-04-06 DIAGNOSIS — R809 Proteinuria, unspecified: Secondary | ICD-10-CM | POA: Diagnosis not present

## 2018-04-06 DIAGNOSIS — R600 Localized edema: Secondary | ICD-10-CM | POA: Diagnosis not present

## 2018-04-06 DIAGNOSIS — I10 Essential (primary) hypertension: Secondary | ICD-10-CM | POA: Diagnosis not present

## 2018-04-06 DIAGNOSIS — E1122 Type 2 diabetes mellitus with diabetic chronic kidney disease: Secondary | ICD-10-CM | POA: Diagnosis not present

## 2018-04-08 DIAGNOSIS — E1122 Type 2 diabetes mellitus with diabetic chronic kidney disease: Secondary | ICD-10-CM | POA: Diagnosis not present

## 2018-04-08 DIAGNOSIS — N183 Chronic kidney disease, stage 3 (moderate): Secondary | ICD-10-CM | POA: Diagnosis not present

## 2018-04-08 DIAGNOSIS — Z794 Long term (current) use of insulin: Secondary | ICD-10-CM | POA: Diagnosis not present

## 2018-04-09 ENCOUNTER — Other Ambulatory Visit: Payer: Self-pay | Admitting: Nephrology

## 2018-04-09 DIAGNOSIS — N183 Chronic kidney disease, stage 3 unspecified: Secondary | ICD-10-CM

## 2018-04-13 ENCOUNTER — Ambulatory Visit
Admission: RE | Admit: 2018-04-13 | Discharge: 2018-04-13 | Disposition: A | Discharge status: Home / Self Care | Payer: PPO | Source: Ambulatory Visit | Attending: Nephrology | Admitting: Nephrology

## 2018-04-13 DIAGNOSIS — N183 Chronic kidney disease, stage 3 unspecified: Secondary | ICD-10-CM

## 2018-04-13 DIAGNOSIS — Z794 Long term (current) use of insulin: Secondary | ICD-10-CM | POA: Diagnosis not present

## 2018-04-13 DIAGNOSIS — I700 Atherosclerosis of aorta: Secondary | ICD-10-CM | POA: Insufficient documentation

## 2018-04-13 DIAGNOSIS — E1140 Type 2 diabetes mellitus with diabetic neuropathy, unspecified: Secondary | ICD-10-CM | POA: Diagnosis not present

## 2018-04-13 DIAGNOSIS — B351 Tinea unguium: Secondary | ICD-10-CM | POA: Diagnosis not present

## 2018-04-13 DIAGNOSIS — I2510 Atherosclerotic heart disease of native coronary artery without angina pectoris: Secondary | ICD-10-CM | POA: Diagnosis not present

## 2018-04-13 DIAGNOSIS — N281 Cyst of kidney, acquired: Secondary | ICD-10-CM | POA: Insufficient documentation

## 2018-04-13 DIAGNOSIS — N200 Calculus of kidney: Secondary | ICD-10-CM | POA: Insufficient documentation

## 2018-04-13 DIAGNOSIS — K760 Fatty (change of) liver, not elsewhere classified: Secondary | ICD-10-CM | POA: Insufficient documentation

## 2018-04-13 DIAGNOSIS — K5730 Diverticulosis of large intestine without perforation or abscess without bleeding: Secondary | ICD-10-CM | POA: Diagnosis not present

## 2018-04-16 DIAGNOSIS — N183 Chronic kidney disease, stage 3 (moderate): Secondary | ICD-10-CM | POA: Diagnosis not present

## 2018-04-16 DIAGNOSIS — Z794 Long term (current) use of insulin: Secondary | ICD-10-CM | POA: Diagnosis not present

## 2018-04-16 DIAGNOSIS — K760 Fatty (change of) liver, not elsewhere classified: Secondary | ICD-10-CM | POA: Insufficient documentation

## 2018-04-16 DIAGNOSIS — E1169 Type 2 diabetes mellitus with other specified complication: Secondary | ICD-10-CM | POA: Diagnosis not present

## 2018-04-16 DIAGNOSIS — E669 Obesity, unspecified: Secondary | ICD-10-CM | POA: Diagnosis not present

## 2018-04-16 DIAGNOSIS — E1142 Type 2 diabetes mellitus with diabetic polyneuropathy: Secondary | ICD-10-CM | POA: Diagnosis not present

## 2018-04-16 DIAGNOSIS — E1122 Type 2 diabetes mellitus with diabetic chronic kidney disease: Secondary | ICD-10-CM | POA: Diagnosis not present

## 2018-04-16 DIAGNOSIS — Z23 Encounter for immunization: Secondary | ICD-10-CM | POA: Diagnosis not present

## 2018-04-16 DIAGNOSIS — I10 Essential (primary) hypertension: Secondary | ICD-10-CM | POA: Diagnosis not present

## 2018-04-16 DIAGNOSIS — I700 Atherosclerosis of aorta: Secondary | ICD-10-CM | POA: Insufficient documentation

## 2018-04-16 DIAGNOSIS — E1121 Type 2 diabetes mellitus with diabetic nephropathy: Secondary | ICD-10-CM | POA: Diagnosis not present

## 2018-04-21 ENCOUNTER — Other Ambulatory Visit: Payer: Self-pay

## 2018-04-21 ENCOUNTER — Encounter: Payer: Self-pay | Admitting: Urology

## 2018-04-21 ENCOUNTER — Ambulatory Visit: Payer: PPO | Admitting: Urology

## 2018-04-21 ENCOUNTER — Telehealth: Payer: Self-pay | Admitting: Urology

## 2018-04-21 VITALS — BP 127/54 | HR 87 | Ht 70.0 in | Wt 331.4 lb

## 2018-04-21 DIAGNOSIS — N200 Calculus of kidney: Secondary | ICD-10-CM | POA: Diagnosis not present

## 2018-04-21 DIAGNOSIS — N138 Other obstructive and reflux uropathy: Secondary | ICD-10-CM

## 2018-04-21 DIAGNOSIS — N401 Enlarged prostate with lower urinary tract symptoms: Secondary | ICD-10-CM

## 2018-04-21 LAB — URINALYSIS, COMPLETE
Bilirubin, UA: NEGATIVE
GLUCOSE, UA: NEGATIVE
KETONES UA: NEGATIVE
Leukocytes, UA: NEGATIVE
NITRITE UA: NEGATIVE
Specific Gravity, UA: 1.025 (ref 1.005–1.030)
Urobilinogen, Ur: 0.2 mg/dL (ref 0.2–1.0)
pH, UA: 5 (ref 5.0–7.5)

## 2018-04-21 LAB — MICROSCOPIC EXAMINATION: WBC, UA: NONE SEEN /hpf (ref 0–5)

## 2018-04-21 NOTE — Progress Notes (Signed)
04/21/2018 1:46 PM   James Clarke 13-Apr-1941 [id number]  Referring provider: Sofie Hartigan, MD [place],  [id number]  CC: Right flank pain  HPI: I had the pleasure of seeing James Clarke in urology clinic today for right-sided flank pain.  He is a 77 year old obese man with diabetes who reports 3 to 4 weeks of right-sided flank and occasional right groin pain.  This is worse with activity and walking.  There are no alleviating factors.  The pain is moderate to severe.  He denies any gross hematuria.  He does have a history of kidney stones and has required ureteroscopy previously.  He denies fevers or chills.  He was seen in the emergency room in mid abdomen on 04/13/2018 and a CT stone protocol was performed.  There was no hydronephrosis and no ureteral stones, however there was a right upper pole 38m non-obstructing stone.   PMH: Past Medical History:  Diagnosis Date  . Bell's palsy    SEVERAL TIMES  . Bell's palsy   . Cancer ([place]    SKIN  . Diabetes mellitus without complication ([place]   . Dyspnea    DOE  . Hyperlipemia   . Hypertension   . Neuropathy   . Obesity   . Sleep apnea    C PAP    Surgical History: Past Surgical History:  Procedure Laterality Date  . APPENDECTOMY    . CATARACT EXTRACTION W/PHACO Left 05/20/2017   Procedure: CATARACT EXTRACTION PHACO AND INTRAOCULAR LENS PLACEMENT (IOC);  Surgeon: PBirder Robson MD;  Location: [facility] ORS;  Service: Ophthalmology;  Laterality: Left;  [place]32.0 AP% 15.5 CDE 4.97 Fluid Pack Lot # [id number]  . CATARACT EXTRACTION W/PHACO Right 06/10/2017   Procedure: CATARACT EXTRACTION PHACO AND INTRAOCULAR LENS PLACEMENT ([place];  Surgeon: PBirder Robson MD;  Location: [facility] ORS;  Service: Ophthalmology;  Laterality: Right;  [place] 00:51 AP% 15.4 CDE 7.95 Fluid pack lot # [id number]  . CHOLECYSTECTOMY    . FRACTURE SURGERY     WRIST  . HAND SURGERY    . HERNIA REPAIR    . TONSILLECTOMY       Allergies:  Allergies  Allergen Reactions  . Codeine Nausea And Vomiting  . Doxycycline   . Erythromycin Rash    Family History: No family history on file.  Social History:  reports that he has never smoked. He has never used smokeless tobacco. He reports that he does not drink alcohol. His drug history is not on file.  ROS: Please see flowsheet from today's date for complete review of systems.  Physical Exam: BP (!) 127/54   Pulse 87   Ht 5' 10"  (1.778 m)   Wt (!) 331 lb 6.4 oz (150.3 kg)   BMI 47.55 kg/m    Constitutional:  Alert and oriented, No acute distress.  Obese Cardiovascular: No clubbing, cyanosis, or edema. Respiratory: Normal respiratory effort, no increased work of breathing. GI: Abdomen is soft, nontender, nondistended, no abdominal masses GU: Right CVA tenderness Lymph: No cervical or inguinal lymphadenopathy. Skin: No rashes, bruises or suspicious lesions. Neurologic: Grossly intact, no focal deficits, moving all 4 extremities. Psychiatric: Normal mood and affect.  Laboratory Data: Creatinine 1.93, EGFR 33  Pertinent Imaging: I have personally reviewed the CT abdomen pelvis without contrast dated 04/13/2018: There is no hydronephrosis.  No ureteral stones bilaterally.  There is a 7 mm right upper pole nonobstructing stone.  There is a right simple renal cyst.  Assessment & Plan:  In summary, James Clarke is a 77 year old co-morbid gentleman with a month of right-sided flank and groin pain, and a 7 mm non-obstructing right upper pole renal stone on recent CT scan.  His symptoms are more consistent with musculoskeletal pain as opposed to renal colic.  I discussed that his symptoms are unlikely to be related to either his non-obstructing renal stone, or his renal cyst.  The renal cyst does not require any further work-up or imaging.  I did offer him a right ureteroscopy, laser lithotripsy, and stent placement if he continues to have right-sided flank pain  over the next few weeks with no evidence of other etiology.  I counseled him extensively that it is very unlikely this non-obstructing stone is the source of his pain.  Return in about 4 weeks (around 05/19/2018) for KUB prior.  Billey Co, [facility] [address] [place], [place] [address] [phone]

## 2018-04-21 NOTE — Telephone Encounter (Signed)
Pt would like to have KUB done at [facility] location.  Just F.Y.I.

## 2018-04-22 DIAGNOSIS — M9903 Segmental and somatic dysfunction of lumbar region: Secondary | ICD-10-CM | POA: Diagnosis not present

## 2018-04-22 DIAGNOSIS — M9904 Segmental and somatic dysfunction of sacral region: Secondary | ICD-10-CM | POA: Diagnosis not present

## 2018-04-22 DIAGNOSIS — M5416 Radiculopathy, lumbar region: Secondary | ICD-10-CM | POA: Diagnosis not present

## 2018-04-22 DIAGNOSIS — M5417 Radiculopathy, lumbosacral region: Secondary | ICD-10-CM | POA: Diagnosis not present

## 2018-04-23 DIAGNOSIS — M5417 Radiculopathy, lumbosacral region: Secondary | ICD-10-CM | POA: Diagnosis not present

## 2018-04-23 DIAGNOSIS — M5416 Radiculopathy, lumbar region: Secondary | ICD-10-CM | POA: Diagnosis not present

## 2018-04-23 DIAGNOSIS — M9904 Segmental and somatic dysfunction of sacral region: Secondary | ICD-10-CM | POA: Diagnosis not present

## 2018-04-23 DIAGNOSIS — M9903 Segmental and somatic dysfunction of lumbar region: Secondary | ICD-10-CM | POA: Diagnosis not present

## 2018-05-04 DIAGNOSIS — E1122 Type 2 diabetes mellitus with diabetic chronic kidney disease: Secondary | ICD-10-CM | POA: Diagnosis not present

## 2018-05-04 DIAGNOSIS — Z0000 Encounter for general adult medical examination without abnormal findings: Secondary | ICD-10-CM | POA: Diagnosis not present

## 2018-05-04 DIAGNOSIS — E7800 Pure hypercholesterolemia, unspecified: Secondary | ICD-10-CM | POA: Diagnosis not present

## 2018-05-04 DIAGNOSIS — N184 Chronic kidney disease, stage 4 (severe): Secondary | ICD-10-CM | POA: Diagnosis not present

## 2018-05-04 DIAGNOSIS — I10 Essential (primary) hypertension: Secondary | ICD-10-CM | POA: Diagnosis not present

## 2018-05-04 DIAGNOSIS — Z794 Long term (current) use of insulin: Secondary | ICD-10-CM | POA: Diagnosis not present

## 2018-05-04 DIAGNOSIS — J301 Allergic rhinitis due to pollen: Secondary | ICD-10-CM | POA: Diagnosis not present

## 2018-05-04 DIAGNOSIS — Z6841 Body Mass Index (BMI) 40.0 and over, adult: Secondary | ICD-10-CM | POA: Diagnosis not present

## 2018-05-04 DIAGNOSIS — G4733 Obstructive sleep apnea (adult) (pediatric): Secondary | ICD-10-CM | POA: Diagnosis not present

## 2018-05-15 ENCOUNTER — Other Ambulatory Visit: Payer: Self-pay

## 2018-05-15 ENCOUNTER — Ambulatory Visit (INDEPENDENT_AMBULATORY_CARE_PROVIDER_SITE_OTHER): Payer: PPO

## 2018-05-15 ENCOUNTER — Ambulatory Visit
Admission: EM | Admit: 2018-05-15 | Discharge: 2018-05-15 | Disposition: A | Discharge status: Home / Self Care | Payer: PPO | Attending: Family Medicine | Admitting: Family Medicine

## 2018-05-15 DIAGNOSIS — M722 Plantar fascial fibromatosis: Secondary | ICD-10-CM | POA: Diagnosis not present

## 2018-05-15 DIAGNOSIS — M79672 Pain in left foot: Secondary | ICD-10-CM | POA: Diagnosis not present

## 2018-05-15 DIAGNOSIS — M7732 Calcaneal spur, left foot: Secondary | ICD-10-CM

## 2018-05-15 MED ORDER — NAPROXEN 375 MG PO TABS: 375.0000 mg | ORAL_TABLET | Freq: Two times a day (BID) | ORAL | 0 refills | Status: DC

## 2018-05-15 NOTE — ED Provider Notes (Signed)
t may be from [place]: [id number] Arrival date & time: 05/15/18  1240     History   Chief Complaint Chief Complaint  Patient presents with  . Foot Pain    left    HPI James Clarke is a 77 y.o. male.   HPI  77 year old male presents with left heel pain. States that it happened suddenly and thought that it was from different shoewear that he has been wearing.  He has a history of diabetes mellitus as well as neuropathy.  Sees a podiatrist but was not able to get in today due to their workload.  States that the first step after resting is almost unbearable.  Is been noticing the pain in his heel throughout the day with any weightbearing but is comfortable at rest.        Past Medical History:  Diagnosis Date  . Bell's palsy    SEVERAL TIMES  . Bell's palsy   . Cancer ([place])    SKIN  . Diabetes mellitus without complication ([place])   . Dyspnea    DOE  . Hyperlipemia   . Hypertension   . Neuropathy   . Obesity   . Sleep apnea    C PAP    Patient Active Problem List   Diagnosis Date Noted  . Fatty liver 04/16/2018  . Aortic atherosclerosis ([place]) 04/16/2018  . Bilateral leg edema 03/17/2018  . Morbid obesity due to excess calories ([place]) 11/11/2014  . Microalbuminuria 11/11/2014  . Long-term insulin use ([place]) 11/11/2014  . Perennial allergic rhinitis 09/12/2014  . Type 2 diabetes mellitus with renal manifestations ([place]) 03/19/2014  . Sleep apnea 03/19/2014  . Hypertension 03/19/2014  . Hyperlipemia 03/19/2014    Past Surgical History:  Procedure Laterality Date  . APPENDECTOMY    . CATARACT EXTRACTION W/PHACO Left 05/20/2017   Procedure: CATARACT EXTRACTION PHACO AND INTRAOCULAR LENS PLACEMENT (IOC);  Surgeon: Birder Robson, MD;  Location: [facility] ORS;  Service: Ophthalmology;  Laterality: Left;  [place] 00:32.0 AP% 15.5 CDE 4.97 Fluid Pack Lot # [id number]  . CATARACT EXTRACTION W/PHACO Right 06/10/2017   Procedure: CATARACT EXTRACTION  PHACO AND INTRAOCULAR LENS PLACEMENT ([place]);  Surgeon: Birder Robson, MD;  Location: [facility] ORS;  Service: Ophthalmology;  Laterality: Right;  [place]  00:51 AP% 15.4 CDE 7.95 Fluid pack lot # [id number]  . CHOLECYSTECTOMY    . FRACTURE SURGERY     WRIST  . HAND SURGERY    . HERNIA REPAIR    . TONSILLECTOMY         Home Medications    Prior to Admission medications   Medication Sig Start Date End Date Taking? Authorizing Provider  albuterol (PROVENTIL HFA;VENTOLIN HFA) 108 (90 Base) MCG/ACT inhaler Inhale 2 puffs into the lungs every 6 (six) hours as needed for wheezing or shortness of breath.   Yes [provider]  amLODipine-olmesartan (AZOR) 5-40 MG tablet Take 1 tablet by mouth daily. 04/25/17  Yes [provider]  calcium carbonate (OS-CAL) 1250 (500 Ca) MG chewable tablet Chew 1 tablet by mouth daily.   Yes [provider]  chlorthalidone (HYGROTON) 25 MG tablet Take 25 mg by mouth daily. 02/13/17  Yes [provider]  fluticasone (FLONASE) 50 MCG/ACT nasal spray Place 2 sprays daily as needed into both nostrils for allergies. For allergies. 03/17/17  Yes [provider]  furosemide (LASIX) 40 MG tablet  05/06/18  Yes [provider]  insulin aspart (NOVOLOG) 100 UNIT/ML injection Inject  30-80 Units See admin instructions into the skin. 45 units with breakfast, 30 units with lunch, and 80 units with supper    Yes [provider]  insulin detemir (LEVEMIR) 100 UNIT/ML injection Inject 90 Units into the skin at bedtime.   Yes [provider]  lovastatin (MEVACOR) 40 MG tablet Take 40 mg by mouth daily with supper. 04/28/17  Yes [provider]  metoprolol succinate (TOPROL-XL) 50 MG 24 hr tablet Take 25 mg by mouth daily. 03/31/17  Yes [provider]  potassium chloride SA (K-DUR,KLOR-CON) 20 MEQ tablet Take 20 mEq by mouth daily. 04/28/17  Yes [provider]  naproxen (NAPROSYN) 375 MG tablet  Take 1 tablet (375 mg total) by mouth 2 (two) times daily. 05/15/18   Lorin Picket, PA-C    Family History Family History  Problem Relation Age of Onset  . Emphysema Mother   . COPD Mother   . Heart disease Mother   . Brain cancer Father     Social History Social History   Tobacco Use  . Smoking status: Never Smoker  . Smokeless tobacco: Never Used  Substance Use Topics  . Alcohol use: No  . Drug use: Not Currently     Allergies   Codeine; Doxycycline; and Erythromycin   Review of Systems Review of Systems  Constitutional: Positive for activity change. Negative for appetite change, chills, fatigue and fever.  Musculoskeletal: Positive for gait problem.  All other systems reviewed and are negative.    Physical Exam Triage Vital Signs ED Triage Vitals  Enc Vitals Group     BP 05/15/18 1255 (!) 152/67     Pulse Rate 05/15/18 1255 77     Resp 05/15/18 1255 18     Temp 05/15/18 1255 98.2 F (36.8 C)     Temp Source 05/15/18 1255 Oral     SpO2 05/15/18 1255 100 %     Weight 05/15/18 1254 (!) 330 lb (149.7 kg)     Height 05/15/18 1254 5\' 10"  (1.778 m)     Head Circumference --      Peak Flow --      Pain Score 05/15/18 1253 8     Pain Loc --      Pain Edu? --      Excl. in [place]? --    No data found.  Updated Vital Signs BP (!) 152/67 (BP Location: Left Arm)   Pulse 77   Temp 98.2 F (36.8 C) (Oral)   Resp 18   Ht 5\' 10"  (1.778 m)   Wt (!) 330 lb (149.7 kg)   SpO2 100%   BMI 47.35 kg/m   Visual Acuity Right Eye Distance:   Left Eye Distance:   Bilateral Distance:    Right Eye Near:   Left Eye Near:    Bilateral Near:     Physical Exam  Constitutional: He is oriented to person, place, and time. He appears well-developed and well-nourished. No distress.  HENT:  Head: Normocephalic.  Eyes: Pupils are equal, round, and reactive to light. Right eye exhibits no discharge. Left eye exhibits no discharge.  Neck: Normal range of motion.    Musculoskeletal: Normal range of motion. He exhibits tenderness.  Examination of the left foot shows nonpitting edema of both legs and ankle.  Patient has tenderness of the heel mostly over the medial weightbearing tubercle but also the plantar surface and along the plantar fascia.  Flexion with the dorsiflexion of the toes reproduces his symptoms.  Is no warmth erythema  Neurological: He is alert and oriented to person, place, and time.  Skin: Skin is warm and dry. He is not diaphoretic.  Psychiatric: He has a normal mood and affect. His behavior is normal. Judgment and thought content normal.  Nursing note and vitals reviewed.    UC Treatments / Results  Labs (all labs ordered are listed, but only abnormal results are displayed) Labs Reviewed - No data to display  EKG None  Radiology Dg Foot Complete Left  Result Date: 05/15/2018 CLINICAL DATA:  Medial side heel pain after wearing different pair of shoes with less cushion. No other known issues EXAM: LEFT FOOT - COMPLETE 3+ VIEW COMPARISON:  None. FINDINGS: There is no acute fracture or subluxation. Moderate plantar calcaneal spur is present. There is calcification associated with the Achilles tendon. No radiopaque foreign body or soft tissue gas. IMPRESSION: No evidence for acute  abnormality. Plantar and Achilles calcaneal spurs. Electronically Signed   By: Nolon Nations M.D.   On: 05/15/2018 13:42    Procedures Procedures (including critical care time)  Medications Ordered in UC Medications - No data to display  Initial Impression / Assessment and Plan / UC Course  I have reviewed the triage vital signs and the nursing notes.  Pertinent labs & imaging results that were available during my care of the patient were reviewed by me and considered in my medical decision making (see chart for details).     I have told the patient about plantar fasciitis.  Told him that can be a long-lasting enigma.  Need to follow-up with his  podiatrist.  In the meantime I will start him on anti-inflammatory medications.  Recommended Silastic heel pads in both shoes.  He should google exercises for plantar fasciitis and be diligent in performing them several times daily.  Use ice on the area for comfort. Final Clinical Impressions(s) / UC Diagnoses   Final diagnoses:  Plantar fasciitis of left foot     Discharge Instructions     Google exercises for plantar fasciitis.  Obtain a silicone heel pad for both shoes.  Use ice massage on the heel as necessary for discomfort.   ED Prescriptions    Medication Sig Dispense Auth. Provider   naproxen (NAPROSYN) 375 MG tablet Take 1 tablet (375 mg total) by mouth 2 (two) times daily. 30 tablet Lorin Picket, PA-C     Controlled Substance Prescriptions [place] Controlled Substance Registry consulted? Not Applicable   Lorin Picket, PA-C 05/15/18 3903

## 2018-05-15 NOTE — ED Triage Notes (Signed)
Patient complains of left heel pain without injury that started yesterday. Patient states that it happened suddenly, recently wearing different shoes.

## 2018-05-15 NOTE — Discharge Instructions (Signed)
Google exercises for plantar fasciitis.  Obtain a silicone heel pad for both shoes.  Use ice massage on the heel as necessary for discomfort.

## 2018-05-19 DIAGNOSIS — M7732 Calcaneal spur, left foot: Secondary | ICD-10-CM | POA: Diagnosis not present

## 2018-05-19 DIAGNOSIS — M722 Plantar fascial fibromatosis: Secondary | ICD-10-CM | POA: Diagnosis not present

## 2018-05-21 ENCOUNTER — Ambulatory Visit
Admission: RE | Admit: 2018-05-21 | Discharge: 2018-05-21 | Disposition: A | Discharge status: Home / Self Care | Payer: PPO | Source: Ambulatory Visit | Attending: Urology | Admitting: Urology

## 2018-05-21 DIAGNOSIS — N200 Calculus of kidney: Secondary | ICD-10-CM

## 2018-05-25 ENCOUNTER — Encounter: Payer: Self-pay | Admitting: Urology

## 2018-05-25 ENCOUNTER — Other Ambulatory Visit: Payer: Self-pay

## 2018-05-25 ENCOUNTER — Ambulatory Visit: Payer: PPO | Admitting: Urology

## 2018-05-25 VITALS — BP 133/73 | HR 80 | Ht 70.0 in | Wt 330.0 lb

## 2018-05-25 DIAGNOSIS — N200 Calculus of kidney: Secondary | ICD-10-CM | POA: Diagnosis not present

## 2018-05-25 NOTE — Progress Notes (Signed)
   05/25/2018 5:50 PM   James Clarke 08/07/1940 [id number]  Reason for visit: Follow up right upper pole stone  HPI: I had the pleasure of seeing James Clarke back in urology clinic for a right nonobstructive upper pole stone.  I previously saw him in early October when he was having severe right-sided back pain.  I felt that this was more musculoskeletal nature, and unrelated to his stone.  He reports today his back pain is completely resolved.  KUB 05/21/2018 shows unchanged position of his nonobstructive 8 mm right upper pole stone.  There are no aggravating or alleviating factors.  Pain is currently 0/10.  ROS: Please see flowsheet from today's date for complete review of systems.  Physical Exam: BP 133/73   Pulse 80   Ht 5\' 10"  (1.778 m)   Wt (!) 330 lb (149.7 kg)   BMI 47.35 kg/m    Constitutional:  Alert and oriented, No acute distress.  Obese Respiratory: Normal respiratory effort, no increased work of breathing. GI: Abdomen is soft, nontender, nondistended, no abdominal masses GU: No CVA tenderness Skin: No rashes, bruises or suspicious lesions. Neurologic: Grossly intact, no focal deficits, moving all 4 extremities. Psychiatric: Normal mood and affect  Laboratory Data: None to review  Pertinent Imaging:  I have personally reviewed the KUB, 8 mm nonobstructive upper pole stone  Assessment & Plan:   In summary, James Clarke is a comorbid and obese 77 year old male with a asymptomatic right 8 mm upper pole stone.  We discussed various treatment options for urolithiasis including observation with or without medical expulsive therapy, shockwave lithotripsy (SWL), ureteroscopy and laser lithotripsy with stent placement, and percutaneous nephrolithotomy.  We discussed that management is based on stone size, location, density, patient co-morbidities, and patient preference.   Stones <2mm in size have a >80% spontaneous passage rate. Data surrounding the use of tamsulosin for  medical expulsive therapy is controversial, but meta analyses suggests it is most efficacious for distal stones between 5-29mm in size. Possible side effects include dizziness/lightheadedness, and retrograde ejaculation.  SWL has a lower stone free rate in a single procedure, but also a lower complication rate compared to ureteroscopy and avoids a stent and associated stent related symptoms. Possible complications include renal hematoma, steinstrasse, and need for additional treatment.  Ureteroscopy with laser lithotripsy and stent placement has a higher stone free rate than SWL in a single procedure, however increased complication rate including possible infection, ureteral injury, bleeding, and stent related morbidity. Common stent related symptoms include dysuria, urgency/frequency, and flank pain.  He elects for surveillance at this time with yearly KUB, which is very reasonable. He would likely be a better candidate for ureteroscopy over SWL if he ultimately underwent treatment of the stone, secondary to his very large SSD(>16cm) Return in about 1 year (around 05/26/2019) for KUB.  Billey Co, [facility] [address], [place], [place] [address] [phone]

## 2018-06-09 DIAGNOSIS — M722 Plantar fascial fibromatosis: Secondary | ICD-10-CM | POA: Diagnosis not present

## 2018-07-13 DIAGNOSIS — Z794 Long term (current) use of insulin: Secondary | ICD-10-CM | POA: Diagnosis not present

## 2018-07-13 DIAGNOSIS — E1140 Type 2 diabetes mellitus with diabetic neuropathy, unspecified: Secondary | ICD-10-CM | POA: Diagnosis not present

## 2018-07-13 DIAGNOSIS — B351 Tinea unguium: Secondary | ICD-10-CM | POA: Diagnosis not present

## 2018-07-25 DIAGNOSIS — N189 Chronic kidney disease, unspecified: Secondary | ICD-10-CM | POA: Diagnosis not present

## 2018-07-25 DIAGNOSIS — N179 Acute kidney failure, unspecified: Secondary | ICD-10-CM | POA: Diagnosis not present

## 2018-07-25 DIAGNOSIS — N201 Calculus of ureter: Secondary | ICD-10-CM | POA: Diagnosis not present

## 2018-07-25 DIAGNOSIS — R1032 Left lower quadrant pain: Secondary | ICD-10-CM | POA: Diagnosis not present

## 2018-07-31 DIAGNOSIS — E1122 Type 2 diabetes mellitus with diabetic chronic kidney disease: Secondary | ICD-10-CM | POA: Diagnosis not present

## 2018-07-31 DIAGNOSIS — I10 Essential (primary) hypertension: Secondary | ICD-10-CM | POA: Diagnosis not present

## 2018-07-31 DIAGNOSIS — N2581 Secondary hyperparathyroidism of renal origin: Secondary | ICD-10-CM | POA: Diagnosis not present

## 2018-07-31 DIAGNOSIS — R600 Localized edema: Secondary | ICD-10-CM | POA: Diagnosis not present

## 2018-07-31 DIAGNOSIS — N183 Chronic kidney disease, stage 3 (moderate): Secondary | ICD-10-CM | POA: Diagnosis not present

## 2018-08-12 DIAGNOSIS — E1142 Type 2 diabetes mellitus with diabetic polyneuropathy: Secondary | ICD-10-CM | POA: Diagnosis not present

## 2018-08-19 DIAGNOSIS — E1121 Type 2 diabetes mellitus with diabetic nephropathy: Secondary | ICD-10-CM | POA: Diagnosis not present

## 2018-08-19 DIAGNOSIS — E1122 Type 2 diabetes mellitus with diabetic chronic kidney disease: Secondary | ICD-10-CM | POA: Diagnosis not present

## 2018-08-19 DIAGNOSIS — Z794 Long term (current) use of insulin: Secondary | ICD-10-CM | POA: Diagnosis not present

## 2018-08-19 DIAGNOSIS — N183 Chronic kidney disease, stage 3 (moderate): Secondary | ICD-10-CM | POA: Diagnosis not present

## 2018-08-19 DIAGNOSIS — E1169 Type 2 diabetes mellitus with other specified complication: Secondary | ICD-10-CM | POA: Diagnosis not present

## 2018-08-19 DIAGNOSIS — E1142 Type 2 diabetes mellitus with diabetic polyneuropathy: Secondary | ICD-10-CM | POA: Diagnosis not present

## 2018-08-19 DIAGNOSIS — E669 Obesity, unspecified: Secondary | ICD-10-CM | POA: Diagnosis not present

## 2018-09-14 ENCOUNTER — Other Ambulatory Visit: Payer: Self-pay | Admitting: Orthopedic Surgery

## 2018-09-14 DIAGNOSIS — M25561 Pain in right knee: Secondary | ICD-10-CM | POA: Diagnosis not present

## 2018-09-14 DIAGNOSIS — G8929 Other chronic pain: Secondary | ICD-10-CM | POA: Diagnosis not present

## 2018-09-14 DIAGNOSIS — M25361 Other instability, right knee: Secondary | ICD-10-CM

## 2018-09-14 DIAGNOSIS — M2391 Unspecified internal derangement of right knee: Secondary | ICD-10-CM | POA: Diagnosis not present

## 2018-09-24 ENCOUNTER — Ambulatory Visit
Admission: RE | Admit: 2018-09-24 | Discharge: 2018-09-24 | Disposition: A | Discharge status: Home / Self Care | Payer: PPO | Source: Ambulatory Visit | Attending: Orthopedic Surgery | Admitting: Orthopedic Surgery

## 2018-09-24 ENCOUNTER — Other Ambulatory Visit: Payer: Self-pay

## 2018-09-24 DIAGNOSIS — M25361 Other instability, right knee: Secondary | ICD-10-CM | POA: Diagnosis not present

## 2018-09-24 DIAGNOSIS — M25561 Pain in right knee: Secondary | ICD-10-CM | POA: Diagnosis not present

## 2018-10-07 DIAGNOSIS — I700 Atherosclerosis of aorta: Secondary | ICD-10-CM | POA: Diagnosis not present

## 2018-10-07 DIAGNOSIS — E7800 Pure hypercholesterolemia, unspecified: Secondary | ICD-10-CM | POA: Diagnosis not present

## 2018-10-07 DIAGNOSIS — M23203 Derangement of unspecified medial meniscus due to old tear or injury, right knee: Secondary | ICD-10-CM | POA: Insufficient documentation

## 2018-10-07 DIAGNOSIS — M1711 Unilateral primary osteoarthritis, right knee: Secondary | ICD-10-CM | POA: Diagnosis not present

## 2018-10-07 DIAGNOSIS — R0600 Dyspnea, unspecified: Secondary | ICD-10-CM | POA: Diagnosis not present

## 2018-10-07 DIAGNOSIS — I10 Essential (primary) hypertension: Secondary | ICD-10-CM | POA: Diagnosis not present

## 2018-10-07 DIAGNOSIS — G4733 Obstructive sleep apnea (adult) (pediatric): Secondary | ICD-10-CM | POA: Diagnosis not present

## 2018-10-12 DIAGNOSIS — Z794 Long term (current) use of insulin: Secondary | ICD-10-CM | POA: Diagnosis not present

## 2018-10-12 DIAGNOSIS — E1140 Type 2 diabetes mellitus with diabetic neuropathy, unspecified: Secondary | ICD-10-CM | POA: Diagnosis not present

## 2018-10-12 DIAGNOSIS — B351 Tinea unguium: Secondary | ICD-10-CM | POA: Diagnosis not present

## 2018-11-16 DIAGNOSIS — E7800 Pure hypercholesterolemia, unspecified: Secondary | ICD-10-CM | POA: Diagnosis not present

## 2018-11-16 DIAGNOSIS — Z6841 Body Mass Index (BMI) 40.0 and over, adult: Secondary | ICD-10-CM | POA: Diagnosis not present

## 2018-11-16 DIAGNOSIS — N184 Chronic kidney disease, stage 4 (severe): Secondary | ICD-10-CM | POA: Diagnosis not present

## 2018-11-16 DIAGNOSIS — J301 Allergic rhinitis due to pollen: Secondary | ICD-10-CM | POA: Diagnosis not present

## 2018-11-16 DIAGNOSIS — E1122 Type 2 diabetes mellitus with diabetic chronic kidney disease: Secondary | ICD-10-CM | POA: Diagnosis not present

## 2018-11-16 DIAGNOSIS — G4733 Obstructive sleep apnea (adult) (pediatric): Secondary | ICD-10-CM | POA: Diagnosis not present

## 2018-11-16 DIAGNOSIS — I10 Essential (primary) hypertension: Secondary | ICD-10-CM | POA: Diagnosis not present

## 2018-11-16 DIAGNOSIS — Z794 Long term (current) use of insulin: Secondary | ICD-10-CM | POA: Diagnosis not present

## 2018-12-04 DIAGNOSIS — N2581 Secondary hyperparathyroidism of renal origin: Secondary | ICD-10-CM | POA: Diagnosis not present

## 2018-12-04 DIAGNOSIS — I10 Essential (primary) hypertension: Secondary | ICD-10-CM | POA: Diagnosis not present

## 2018-12-04 DIAGNOSIS — R600 Localized edema: Secondary | ICD-10-CM | POA: Diagnosis not present

## 2018-12-04 DIAGNOSIS — N183 Chronic kidney disease, stage 3 (moderate): Secondary | ICD-10-CM | POA: Diagnosis not present

## 2018-12-04 DIAGNOSIS — E1122 Type 2 diabetes mellitus with diabetic chronic kidney disease: Secondary | ICD-10-CM | POA: Diagnosis not present

## 2018-12-17 DIAGNOSIS — E1122 Type 2 diabetes mellitus with diabetic chronic kidney disease: Secondary | ICD-10-CM | POA: Diagnosis not present

## 2018-12-17 DIAGNOSIS — I10 Essential (primary) hypertension: Secondary | ICD-10-CM | POA: Diagnosis not present

## 2018-12-17 DIAGNOSIS — N183 Chronic kidney disease, stage 3 (moderate): Secondary | ICD-10-CM | POA: Diagnosis not present

## 2019-01-13 DIAGNOSIS — E1140 Type 2 diabetes mellitus with diabetic neuropathy, unspecified: Secondary | ICD-10-CM | POA: Diagnosis not present

## 2019-01-13 DIAGNOSIS — B351 Tinea unguium: Secondary | ICD-10-CM | POA: Diagnosis not present

## 2019-01-13 DIAGNOSIS — Z794 Long term (current) use of insulin: Secondary | ICD-10-CM | POA: Diagnosis not present

## 2019-02-12 DIAGNOSIS — Z794 Long term (current) use of insulin: Secondary | ICD-10-CM | POA: Diagnosis not present

## 2019-02-12 DIAGNOSIS — E1121 Type 2 diabetes mellitus with diabetic nephropathy: Secondary | ICD-10-CM | POA: Diagnosis not present

## 2019-02-19 DIAGNOSIS — E1122 Type 2 diabetes mellitus with diabetic chronic kidney disease: Secondary | ICD-10-CM | POA: Diagnosis not present

## 2019-02-19 DIAGNOSIS — E1121 Type 2 diabetes mellitus with diabetic nephropathy: Secondary | ICD-10-CM | POA: Diagnosis not present

## 2019-02-19 DIAGNOSIS — E669 Obesity, unspecified: Secondary | ICD-10-CM | POA: Diagnosis not present

## 2019-02-19 DIAGNOSIS — Z794 Long term (current) use of insulin: Secondary | ICD-10-CM | POA: Diagnosis not present

## 2019-02-19 DIAGNOSIS — N183 Chronic kidney disease, stage 3 (moderate): Secondary | ICD-10-CM | POA: Diagnosis not present

## 2019-02-19 DIAGNOSIS — E1169 Type 2 diabetes mellitus with other specified complication: Secondary | ICD-10-CM | POA: Diagnosis not present

## 2019-02-19 DIAGNOSIS — E1142 Type 2 diabetes mellitus with diabetic polyneuropathy: Secondary | ICD-10-CM | POA: Diagnosis not present

## 2019-03-15 DIAGNOSIS — L578 Other skin changes due to chronic exposure to nonionizing radiation: Secondary | ICD-10-CM | POA: Diagnosis not present

## 2019-03-15 DIAGNOSIS — Z86018 Personal history of other benign neoplasm: Secondary | ICD-10-CM | POA: Diagnosis not present

## 2019-03-15 DIAGNOSIS — Z872 Personal history of diseases of the skin and subcutaneous tissue: Secondary | ICD-10-CM | POA: Diagnosis not present

## 2019-03-15 DIAGNOSIS — L570 Actinic keratosis: Secondary | ICD-10-CM | POA: Diagnosis not present

## 2019-03-15 DIAGNOSIS — L821 Other seborrheic keratosis: Secondary | ICD-10-CM | POA: Diagnosis not present

## 2019-04-09 DIAGNOSIS — N2581 Secondary hyperparathyroidism of renal origin: Secondary | ICD-10-CM | POA: Diagnosis not present

## 2019-04-09 DIAGNOSIS — R600 Localized edema: Secondary | ICD-10-CM | POA: Diagnosis not present

## 2019-04-09 DIAGNOSIS — E1122 Type 2 diabetes mellitus with diabetic chronic kidney disease: Secondary | ICD-10-CM | POA: Diagnosis not present

## 2019-04-09 DIAGNOSIS — D631 Anemia in chronic kidney disease: Secondary | ICD-10-CM | POA: Diagnosis not present

## 2019-04-09 DIAGNOSIS — N184 Chronic kidney disease, stage 4 (severe): Secondary | ICD-10-CM | POA: Diagnosis not present

## 2019-04-14 DIAGNOSIS — I10 Essential (primary) hypertension: Secondary | ICD-10-CM | POA: Diagnosis not present

## 2019-04-14 DIAGNOSIS — I700 Atherosclerosis of aorta: Secondary | ICD-10-CM | POA: Diagnosis not present

## 2019-04-14 DIAGNOSIS — G4733 Obstructive sleep apnea (adult) (pediatric): Secondary | ICD-10-CM | POA: Diagnosis not present

## 2019-04-14 DIAGNOSIS — E7800 Pure hypercholesterolemia, unspecified: Secondary | ICD-10-CM | POA: Diagnosis not present

## 2019-04-15 DIAGNOSIS — B351 Tinea unguium: Secondary | ICD-10-CM | POA: Diagnosis not present

## 2019-04-15 DIAGNOSIS — E1140 Type 2 diabetes mellitus with diabetic neuropathy, unspecified: Secondary | ICD-10-CM | POA: Diagnosis not present

## 2019-04-15 DIAGNOSIS — Z794 Long term (current) use of insulin: Secondary | ICD-10-CM | POA: Diagnosis not present

## 2019-05-13 DIAGNOSIS — G4733 Obstructive sleep apnea (adult) (pediatric): Secondary | ICD-10-CM | POA: Diagnosis not present

## 2019-05-21 DIAGNOSIS — I10 Essential (primary) hypertension: Secondary | ICD-10-CM | POA: Diagnosis not present

## 2019-05-21 DIAGNOSIS — Z794 Long term (current) use of insulin: Secondary | ICD-10-CM | POA: Diagnosis not present

## 2019-05-21 DIAGNOSIS — E1122 Type 2 diabetes mellitus with diabetic chronic kidney disease: Secondary | ICD-10-CM | POA: Diagnosis not present

## 2019-05-21 DIAGNOSIS — Z0000 Encounter for general adult medical examination without abnormal findings: Secondary | ICD-10-CM | POA: Diagnosis not present

## 2019-05-21 DIAGNOSIS — G4733 Obstructive sleep apnea (adult) (pediatric): Secondary | ICD-10-CM | POA: Diagnosis not present

## 2019-05-21 DIAGNOSIS — J301 Allergic rhinitis due to pollen: Secondary | ICD-10-CM | POA: Diagnosis not present

## 2019-05-21 DIAGNOSIS — N184 Chronic kidney disease, stage 4 (severe): Secondary | ICD-10-CM | POA: Diagnosis not present

## 2019-05-21 DIAGNOSIS — Z6841 Body Mass Index (BMI) 40.0 and over, adult: Secondary | ICD-10-CM | POA: Diagnosis not present

## 2019-05-21 DIAGNOSIS — R1031 Right lower quadrant pain: Secondary | ICD-10-CM | POA: Diagnosis not present

## 2019-05-21 DIAGNOSIS — E7800 Pure hypercholesterolemia, unspecified: Secondary | ICD-10-CM | POA: Diagnosis not present

## 2019-05-24 ENCOUNTER — Ambulatory Visit
Admission: RE | Admit: 2019-05-24 | Discharge: 2019-05-24 | Disposition: A | Discharge status: Home / Self Care | Payer: PPO | Attending: Urology | Admitting: Urology

## 2019-05-24 ENCOUNTER — Other Ambulatory Visit: Payer: Self-pay

## 2019-05-24 ENCOUNTER — Ambulatory Visit
Admission: RE | Admit: 2019-05-24 | Discharge: 2019-05-24 | Disposition: A | Discharge status: Home / Self Care | Payer: PPO | Source: Ambulatory Visit | Attending: Urology | Admitting: Urology

## 2019-05-24 DIAGNOSIS — N200 Calculus of kidney: Secondary | ICD-10-CM

## 2019-05-27 ENCOUNTER — Ambulatory Visit: Payer: PPO | Admitting: Urology

## 2019-06-03 ENCOUNTER — Telehealth: Payer: Self-pay | Admitting: Urology

## 2019-06-03 ENCOUNTER — Telehealth (INDEPENDENT_AMBULATORY_CARE_PROVIDER_SITE_OTHER): Payer: PPO | Admitting: Urology

## 2019-06-03 ENCOUNTER — Other Ambulatory Visit: Payer: Self-pay

## 2019-06-03 DIAGNOSIS — N200 Calculus of kidney: Secondary | ICD-10-CM | POA: Diagnosis not present

## 2019-06-03 DIAGNOSIS — E119 Type 2 diabetes mellitus without complications: Secondary | ICD-10-CM | POA: Diagnosis not present

## 2019-06-03 NOTE — Progress Notes (Signed)
Virtual Visit via Telephone Note  I connected with James Clarke on 06/03/19 at  9:00 AM EST by telephone and verified that I am speaking with the correct person using two identifiers.   I discussed the limitations, risks, security and privacy concerns of performing an evaluation and management service by telephone and the availability of in person appointments. We discussed the impact of the COVID-19 pandemic on the healthcare system, and the importance of social distancing and reducing patient and provider exposure. I also discussed with the patient that there may be a patient responsible charge related to this service. The patient expressed understanding and agreed to proceed.  Reason for visit: Right renal stone  History of Present Illness: I had virtual follow-up with Mr. Antonini for his nonobstructing right upper pole stone.  We have been following this for over a year, and he remains asymptomatic without complaint this year.  He denies any flank pain or gross hematuria.  He denies any UTIs.   KUB on 05/24/2019 shows no significant change in his right-sided renal stone over the upper pole.  We again discussed options including observation or ureteroscopy, laser lithotripsy, stent placement.  As he remains asymptomatic, he would like to continue observation which is very reasonable.  We discussed return precautions including right-sided flank pain/renal colic, gross hematuria, or UTIs.  Follow Up: 1 year with KUB prior   I discussed the assessment and treatment plan with the patient. The patient was provided an opportunity to ask questions and all were answered. The patient agreed with the plan and demonstrated an understanding of the instructions.   The patient was advised to call back or seek an in-person evaluation if the symptoms worsen or if the condition fails to improve as anticipated.  I provided 12 minutes of non-face-to-face time during this encounter.   Billey Co, MD

## 2019-06-03 NOTE — Telephone Encounter (Signed)
Done

## 2019-06-03 NOTE — Telephone Encounter (Signed)
-----   Message from Billey Co, MD sent at 06/03/2019  9:14 AM EST ----- Regarding: follow up RTC one year with KUB prior(ordered)  Nickolas Madrid, MD 06/03/2019

## 2019-07-19 DIAGNOSIS — E1140 Type 2 diabetes mellitus with diabetic neuropathy, unspecified: Secondary | ICD-10-CM | POA: Diagnosis not present

## 2019-07-19 DIAGNOSIS — Z794 Long term (current) use of insulin: Secondary | ICD-10-CM | POA: Diagnosis not present

## 2019-07-19 DIAGNOSIS — B351 Tinea unguium: Secondary | ICD-10-CM | POA: Diagnosis not present

## 2019-07-26 ENCOUNTER — Ambulatory Visit: Payer: PPO | Admitting: Urology

## 2019-07-26 ENCOUNTER — Observation Stay: Payer: PPO | Admitting: Registered Nurse

## 2019-07-26 ENCOUNTER — Ambulatory Visit
Admission: RE | Admit: 2019-07-26 | Discharge: 2019-07-26 | Disposition: A | Discharge status: Home / Self Care | Payer: PPO | Source: Home / Self Care | Attending: Urology | Admitting: Urology

## 2019-07-26 ENCOUNTER — Other Ambulatory Visit
Admission: RE | Admit: 2019-07-26 | Discharge: 2019-07-26 | Disposition: A | Discharge status: Home / Self Care | Payer: PPO | Source: Home / Self Care | Attending: Urology | Admitting: Urology

## 2019-07-26 ENCOUNTER — Other Ambulatory Visit: Payer: Self-pay

## 2019-07-26 ENCOUNTER — Ambulatory Visit
Admission: RE | Admit: 2019-07-26 | Discharge: 2019-07-26 | Disposition: A | Discharge status: Home / Self Care | Payer: PPO | Source: Ambulatory Visit | Attending: Urology | Admitting: Urology

## 2019-07-26 ENCOUNTER — Encounter
Admission: EM | Disposition: A | Discharge status: Home / Self Care | Payer: Self-pay | Source: Home / Self Care | Attending: Emergency Medicine

## 2019-07-26 ENCOUNTER — Other Ambulatory Visit: Payer: Self-pay | Admitting: Family Medicine

## 2019-07-26 ENCOUNTER — Encounter: Payer: Self-pay | Admitting: Urology

## 2019-07-26 ENCOUNTER — Emergency Department: Payer: PPO

## 2019-07-26 ENCOUNTER — Observation Stay
Admission: EM | Admit: 2019-07-26 | Discharge: 2019-07-27 | Disposition: A | Discharge status: Home / Self Care | Payer: PPO | Attending: Urology | Admitting: Urology

## 2019-07-26 VITALS — BP 144/66 | HR 87 | Ht 70.0 in | Wt 331.0 lb

## 2019-07-26 DIAGNOSIS — R3129 Other microscopic hematuria: Secondary | ICD-10-CM

## 2019-07-26 DIAGNOSIS — Z791 Long term (current) use of non-steroidal anti-inflammatories (NSAID): Secondary | ICD-10-CM | POA: Diagnosis not present

## 2019-07-26 DIAGNOSIS — N200 Calculus of kidney: Secondary | ICD-10-CM | POA: Insufficient documentation

## 2019-07-26 DIAGNOSIS — N183 Chronic kidney disease, stage 3 unspecified: Secondary | ICD-10-CM | POA: Insufficient documentation

## 2019-07-26 DIAGNOSIS — Z79899 Other long term (current) drug therapy: Secondary | ICD-10-CM | POA: Insufficient documentation

## 2019-07-26 DIAGNOSIS — N2581 Secondary hyperparathyroidism of renal origin: Secondary | ICD-10-CM | POA: Insufficient documentation

## 2019-07-26 DIAGNOSIS — Z20822 Contact with and (suspected) exposure to covid-19: Secondary | ICD-10-CM | POA: Insufficient documentation

## 2019-07-26 DIAGNOSIS — R600 Localized edema: Secondary | ICD-10-CM | POA: Insufficient documentation

## 2019-07-26 DIAGNOSIS — M1711 Unilateral primary osteoarthritis, right knee: Secondary | ICD-10-CM | POA: Diagnosis not present

## 2019-07-26 DIAGNOSIS — K760 Fatty (change of) liver, not elsewhere classified: Secondary | ICD-10-CM | POA: Insufficient documentation

## 2019-07-26 DIAGNOSIS — M545 Low back pain: Secondary | ICD-10-CM | POA: Diagnosis not present

## 2019-07-26 DIAGNOSIS — Z794 Long term (current) use of insulin: Secondary | ICD-10-CM | POA: Insufficient documentation

## 2019-07-26 DIAGNOSIS — Z03818 Encounter for observation for suspected exposure to other biological agents ruled out: Secondary | ICD-10-CM | POA: Diagnosis not present

## 2019-07-26 DIAGNOSIS — E1122 Type 2 diabetes mellitus with diabetic chronic kidney disease: Secondary | ICD-10-CM | POA: Insufficient documentation

## 2019-07-26 DIAGNOSIS — E1140 Type 2 diabetes mellitus with diabetic neuropathy, unspecified: Secondary | ICD-10-CM | POA: Insufficient documentation

## 2019-07-26 DIAGNOSIS — E785 Hyperlipidemia, unspecified: Secondary | ICD-10-CM | POA: Diagnosis not present

## 2019-07-26 DIAGNOSIS — I129 Hypertensive chronic kidney disease with stage 1 through stage 4 chronic kidney disease, or unspecified chronic kidney disease: Secondary | ICD-10-CM | POA: Insufficient documentation

## 2019-07-26 DIAGNOSIS — I10 Essential (primary) hypertension: Secondary | ICD-10-CM | POA: Diagnosis not present

## 2019-07-26 DIAGNOSIS — N189 Chronic kidney disease, unspecified: Secondary | ICD-10-CM | POA: Insufficient documentation

## 2019-07-26 DIAGNOSIS — R109 Unspecified abdominal pain: Secondary | ICD-10-CM | POA: Insufficient documentation

## 2019-07-26 DIAGNOSIS — D631 Anemia in chronic kidney disease: Secondary | ICD-10-CM | POA: Insufficient documentation

## 2019-07-26 DIAGNOSIS — N179 Acute kidney failure, unspecified: Secondary | ICD-10-CM | POA: Diagnosis not present

## 2019-07-26 DIAGNOSIS — G4730 Sleep apnea, unspecified: Secondary | ICD-10-CM | POA: Diagnosis not present

## 2019-07-26 DIAGNOSIS — N201 Calculus of ureter: Secondary | ICD-10-CM | POA: Diagnosis present

## 2019-07-26 DIAGNOSIS — Z6841 Body Mass Index (BMI) 40.0 and over, adult: Secondary | ICD-10-CM | POA: Insufficient documentation

## 2019-07-26 DIAGNOSIS — E669 Obesity, unspecified: Secondary | ICD-10-CM | POA: Diagnosis not present

## 2019-07-26 HISTORY — PX: CYSTOSCOPY WITH STENT PLACEMENT: SHX5790

## 2019-07-26 LAB — CBC
HCT: 38.7 % — ABNORMAL LOW (ref 39.0–52.0)
Hemoglobin: 12.4 g/dL — ABNORMAL LOW (ref 13.0–17.0)
MCH: 28.7 pg (ref 26.0–34.0)
MCHC: 32 g/dL (ref 30.0–36.0)
MCV: 89.6 fL (ref 80.0–100.0)
Platelets: 326 10*3/uL (ref 150–400)
RBC: 4.32 MIL/uL (ref 4.22–5.81)
RDW: 13.3 % (ref 11.5–15.5)
WBC: 13.8 10*3/uL — ABNORMAL HIGH (ref 4.0–10.5)
nRBC: 0 % (ref 0.0–0.2)

## 2019-07-26 LAB — URINALYSIS, COMPLETE (UACMP) WITH MICROSCOPIC
Bilirubin Urine: NEGATIVE
Glucose, UA: NEGATIVE mg/dL
Ketones, ur: NEGATIVE mg/dL
Nitrite: NEGATIVE
Protein, ur: 100 mg/dL — AB
RBC / HPF: >50 RBC/hpf (ref 0–5)
Specific Gravity, Urine: 1.02 (ref 1.005–1.030)
WBC, UA: >50 WBC/hpf (ref 0–5)
pH: 5 (ref 5.0–8.0)

## 2019-07-26 LAB — GLUCOSE, CAPILLARY
Glucose-Capillary: 116 mg/dL — ABNORMAL HIGH (ref 70–99)
Glucose-Capillary: 143 mg/dL — ABNORMAL HIGH (ref 70–99)

## 2019-07-26 LAB — BASIC METABOLIC PANEL
Anion gap: 11 (ref 5–15)
BUN: 84 mg/dL — ABNORMAL HIGH (ref 8–23)
CO2: 25 mmol/L (ref 22–32)
Calcium: 9.6 mg/dL (ref 8.9–10.3)
Chloride: 102 mmol/L (ref 98–111)
Creatinine, Ser: 2.84 mg/dL — ABNORMAL HIGH (ref 0.61–1.24)
GFR calc Af Amer: 23 mL/min — ABNORMAL LOW (ref >60)
GFR calc non Af Amer: 20 mL/min — ABNORMAL LOW (ref >60)
Glucose, Bld: 137 mg/dL — ABNORMAL HIGH (ref 70–99)
Potassium: 3.8 mmol/L (ref 3.5–5.1)
Sodium: 138 mmol/L (ref 135–145)

## 2019-07-26 LAB — RESPIRATORY PANEL BY RT PCR (FLU A&B, COVID)
Influenza A by PCR: NEGATIVE
Influenza B by PCR: NEGATIVE
SARS Coronavirus 2 by RT PCR: NEGATIVE

## 2019-07-26 SURGERY — CYSTOSCOPY, WITH STENT INSERTION
Anesthesia: General | Laterality: Right

## 2019-07-26 MED ORDER — PROPOFOL 10 MG/ML IV BOLUS
INTRAVENOUS | Status: AC
  Filled 2019-07-26: qty 20

## 2019-07-26 MED ORDER — SODIUM CHLORIDE 0.9 % IV BOLUS
500.0000 mL | Freq: Once | INTRAVENOUS | Status: AC
  Administered 2019-07-26: 500 mL via INTRAVENOUS

## 2019-07-26 MED ORDER — LIDOCAINE HCL (CARDIAC) PF 100 MG/5ML IV SOSY
PREFILLED_SYRINGE | INTRAVENOUS | Status: DC | PRN
  Administered 2019-07-26: 50 mg via INTRAVENOUS

## 2019-07-26 MED ORDER — ONDANSETRON HCL 4 MG/2ML IJ SOLN: 4.0000 mg | Freq: Once | INTRAMUSCULAR | Status: DC | PRN

## 2019-07-26 MED ORDER — ONDANSETRON HCL 4 MG/2ML IJ SOLN
INTRAMUSCULAR | Status: DC | PRN
  Administered 2019-07-26: 4 mg via INTRAVENOUS

## 2019-07-26 MED ORDER — IOPAMIDOL (ISOVUE-300) INJECTION 61%
INTRAVENOUS | Status: DC | PRN
  Administered 2019-07-26: 19:00:00 10 mL

## 2019-07-26 MED ORDER — FENTANYL CITRATE (PF) 100 MCG/2ML IJ SOLN
INTRAMUSCULAR | Status: AC
  Filled 2019-07-26: qty 2

## 2019-07-26 MED ORDER — FENTANYL CITRATE (PF) 100 MCG/2ML IJ SOLN
INTRAMUSCULAR | Status: DC | PRN
  Administered 2019-07-26: 50 ug via INTRAVENOUS

## 2019-07-26 MED ORDER — MORPHINE SULFATE (PF) 4 MG/ML IV SOLN
4.0000 mg | Freq: Once | INTRAVENOUS | Status: AC
  Administered 2019-07-26: 4 mg via INTRAVENOUS
  Filled 2019-07-26: qty 1

## 2019-07-26 MED ORDER — FENTANYL CITRATE (PF) 100 MCG/2ML IJ SOLN: 25.0000 ug | INTRAMUSCULAR | Status: DC | PRN

## 2019-07-26 MED ORDER — LACTATED RINGERS IV SOLN: INTRAVENOUS | Status: DC | PRN

## 2019-07-26 MED ORDER — SODIUM CHLORIDE 0.9 % IV SOLN
1.0000 g | Freq: Once | INTRAVENOUS | Status: AC
  Administered 2019-07-26: 1 g via INTRAVENOUS
  Filled 2019-07-26: qty 10

## 2019-07-26 MED ORDER — PROPOFOL 10 MG/ML IV BOLUS
INTRAVENOUS | Status: DC | PRN
  Administered 2019-07-26: 150 mg via INTRAVENOUS

## 2019-07-26 MED ORDER — ONDANSETRON HCL 4 MG/2ML IJ SOLN
4.0000 mg | Freq: Once | INTRAMUSCULAR | Status: AC
  Administered 2019-07-26: 4 mg via INTRAVENOUS
  Filled 2019-07-26: qty 2

## 2019-07-26 MED ORDER — SODIUM CHLORIDE 0.9 % IV SOLN: INTRAVENOUS | Status: DC

## 2019-07-26 MED ORDER — SUCCINYLCHOLINE CHLORIDE 20 MG/ML IJ SOLN
INTRAMUSCULAR | Status: AC
  Filled 2019-07-26: qty 1

## 2019-07-26 MED ORDER — SUCCINYLCHOLINE CHLORIDE 20 MG/ML IJ SOLN
INTRAMUSCULAR | Status: DC | PRN
  Administered 2019-07-26: 120 mg via INTRAVENOUS

## 2019-07-26 MED ORDER — ONDANSETRON HCL 4 MG/2ML IJ SOLN
INTRAMUSCULAR | Status: AC
  Filled 2019-07-26: qty 2

## 2019-07-26 SURGICAL SUPPLY — 18 items
BAG DRAIN CYSTO-URO LG1000N (MISCELLANEOUS) ×6 IMPLANT
BRUSH SCRUB EZ  4% CHG (MISCELLANEOUS) ×2
BRUSH SCRUB EZ 4% CHG (MISCELLANEOUS) ×1 IMPLANT
CATH URETL 5X70 OPEN END (CATHETERS) ×3 IMPLANT
CONRAY 43 FOR UROLOGY 50M (MISCELLANEOUS) ×3 IMPLANT
DRAPE UTILITY 15X26 TOWEL STRL (DRAPES) ×3 IMPLANT
GOWN STRL REUS W/ TWL LRG LVL3 (GOWN DISPOSABLE) ×1 IMPLANT
GOWN STRL REUS W/TWL LRG LVL3 (GOWN DISPOSABLE) ×2
GUIDEWIRE STR DUAL SENSOR (WIRE) ×3 IMPLANT
KIT TURNOVER CYSTO (KITS) ×3 IMPLANT
PACK CYSTO AR (MISCELLANEOUS) ×3 IMPLANT
SET CYSTO W/LG BORE CLAMP LF (SET/KITS/TRAYS/PACK) ×3 IMPLANT
SLEEVE SCD COMPRESS THIGH MED (MISCELLANEOUS) ×3 IMPLANT
SOL .9 NS 3000ML IRR  AL (IV SOLUTION) ×2
SOL .9 NS 3000ML IRR UROMATIC (IV SOLUTION) ×1 IMPLANT
STENT URET 6FRX26 CONTOUR (STENTS) ×3 IMPLANT
SURGILUBE 2OZ TUBE FLIPTOP (MISCELLANEOUS) ×3 IMPLANT
WATER STERILE IRR 1000ML POUR (IV SOLUTION) ×3 IMPLANT

## 2019-07-26 NOTE — Discharge Instructions (Signed)

## 2019-07-26 NOTE — Transfer of Care (Signed)
Immediate Anesthesia Transfer of Care Note  Patient: James Clarke James Clarke  Procedure(s) Performed: CYSTOSCOPY WITH STENT PLACEMENT (Right )  Patient Location: PACU  Anesthesia Type:General  Level of Consciousness: confused  Airway & Oxygen Therapy: Patient connected to face mask oxygen  Post-op Assessment: Report given to RN and Post -op Vital signs reviewed and stable  Post vital signs: Reviewed and stable  Last Vitals:  Vitals Value Taken Time  BP 116/83 07/26/19 1918  Temp    Pulse 87 07/26/19 1921  Resp 18 07/26/19 1921  SpO2 100 % 07/26/19 1921  Vitals shown include unvalidated device data.  Last Pain:  Vitals:   07/26/19 1747  TempSrc:   PainSc: 3          Complications: No apparent anesthesia complications

## 2019-07-26 NOTE — Progress Notes (Signed)
07/26/2019 3:22 PM   James Clarke Jul 24, 1940 [id number]  Referring provider: Sofie Hartigan, MD [place],  [place] [address]  Chief Complaint  Patient presents with  . Nephrolithiasis    HPI: James Clarke is a 79 year old DM, CKD male with a right renal stone who presents today with the complaint of left lower back pain.   He has been having left flank pain for the last two weeks.  He states the pain has become unbearable over the last two days.  He states walking and moving makes the pain worse.  Heating pads and ice help somewhat.  He states it feels like someone is stabbing him.  9 1/2 pain.  Patient denies any gross hematuria, dysuria or suprapubic pain.  Patient denies any fevers, chills, nausea or vomiting.   KUB 07/26/2019 no stone seen.   UA 07/26/2019 yellow cloudy, urine dip positive for blood, protein, leukocytes and microscopically positive for 0-5 squamous epithelial cells, > 50 WBC's, > 50 RBC's and a few bacteria.    Had water and milk today.  Glass of milk at 7:30 am and a sip of water at 11:00 am.    He has been having issues with his diabetes as his sugars are erratic.    Non contrast CT in 03/2018 did not reveal any left sided nephrolithiasis.  He states he was hospitalized for a right stone several years ago, but he did not have a procedure to treat the stone and he believes he passed it.    PMH: Past Medical History:  Diagnosis Date  . Bell's palsy    SEVERAL TIMES  . Bell's palsy   . Cancer ([place])    SKIN  . Diabetes mellitus without complication ([place])   . Dyspnea    DOE  . Hyperlipemia   . Hypertension   . Neuropathy   . Obesity   . Sleep apnea    C PAP    Surgical History: Past Surgical History:  Procedure Laterality Date  . APPENDECTOMY    . CATARACT EXTRACTION W/PHACO Left 05/20/2017   Procedure: CATARACT EXTRACTION PHACO AND INTRAOCULAR LENS PLACEMENT (IOC);  Surgeon: Birder Robson, MD;  Location: [facility] ORS;   Service: Ophthalmology;  Laterality: Left;  [place] 00:32.0 AP% 15.5 CDE 4.97 Fluid Pack Lot # [id number]  . CATARACT EXTRACTION W/PHACO Right 06/10/2017   Procedure: CATARACT EXTRACTION PHACO AND INTRAOCULAR LENS PLACEMENT ([place]);  Surgeon: Birder Robson, MD;  Location: [facility] ORS;  Service: Ophthalmology;  Laterality: Right;  [place]  00:51 AP% 15.4 CDE 7.95 Fluid pack lot # [id number]  . CHOLECYSTECTOMY    . FRACTURE SURGERY     WRIST  . HAND SURGERY    . HERNIA REPAIR    . TONSILLECTOMY      Home Medications:  Allergies as of 07/26/2019      Reactions   Codeine Nausea And Vomiting   Doxycycline    Erythromycin Rash      Medication List       Accurate as of July 26, 2019  3:22 PM. If you have any questions, ask your nurse or doctor.        albuterol 108 (90 Base) MCG/ACT inhaler Commonly known as: VENTOLIN HFA Inhale 2 puffs into the lungs every 6 (six) hours as needed for wheezing or shortness of breath.   amLODipine-olmesartan 5-40 MG tablet Commonly known as: AZOR Take 1 tablet by mouth daily.   calcium carbonate 1250 (500 Ca) MG  chewable tablet Commonly known as: OS-CAL Chew 1 tablet by mouth daily.   chlorthalidone 25 MG tablet Commonly known as: HYGROTON Take 25 mg by mouth daily.   fluticasone 50 MCG/ACT nasal spray Commonly known as: FLONASE Place 2 sprays daily as needed into both nostrils for allergies. For allergies.   furosemide 40 MG tablet Commonly known as: LASIX   insulin aspart 100 UNIT/ML injection Commonly known as: novoLOG Inject 30-80 Units See admin instructions into the skin. 45 units with breakfast, 30 units with lunch, and 80 units with supper   insulin detemir 100 UNIT/ML injection Commonly known as: LEVEMIR Inject 90 Units into the skin at bedtime.   lovastatin 40 MG tablet Commonly known as: MEVACOR Take 40 mg by mouth daily with supper.   metoprolol succinate 50 MG 24 hr tablet Commonly known as: TOPROL-XL Take 25 mg by mouth  daily.   naproxen 375 MG tablet Commonly known as: NAPROSYN Take 1 tablet (375 mg total) by mouth 2 (two) times daily.   potassium chloride SA 20 MEQ tablet Commonly known as: KLOR-CON Take 20 mEq by mouth daily.       Allergies:  Allergies  Allergen Reactions  . Codeine Nausea And Vomiting  . Doxycycline   . Erythromycin Rash    Family History: Family History  Problem Relation Age of Onset  . Emphysema Mother   . COPD Mother   . Heart disease Mother   . Brain cancer Father     Social History:  reports that he has never smoked. He has never used smokeless tobacco. He reports previous drug use. He reports that he does not drink alcohol.  ROS: UROLOGY Frequent Urination?: No Hard to postpone urination?: No Burning/pain with urination?: No Get up at night to urinate?: Yes Leakage of urine?: No Urine stream starts and stops?: No Trouble starting stream?: No Do you have to strain to urinate?: No Blood in urine?: No Urinary tract infection?: No Sexually transmitted disease?: No Injury to kidneys or bladder?: No Painful intercourse?: No Weak stream?: No Erection problems?: No Penile pain?: No  Gastrointestinal Nausea?: No Vomiting?: No Indigestion/heartburn?: No Diarrhea?: No Constipation?: No  Constitutional Fever: No Night sweats?: No Weight loss?: No Fatigue?: No  Skin Skin rash/lesions?: No Itching?: No  Eyes Blurred vision?: No Double vision?: No  Ears/Nose/Throat Sore throat?: No Sinus problems?: No  Hematologic/Lymphatic Swollen glands?: No Easy bruising?: No  Cardiovascular Leg swelling?: Yes Chest pain?: No  Respiratory Cough?: No Shortness of breath?: Yes  Endocrine Excessive thirst?: No  Musculoskeletal Back pain?: No Joint pain?: No  Neurological Headaches?: No Dizziness?: No  Psychologic Depression?: No Anxiety?: No  Physical Exam: BP (!) 144/66   Pulse 87   Ht 5\' 10"  (1.778 m)   Wt (!) 331 lb (150.1 kg)    BMI 47.49 kg/m   Constitutional:  Well nourished. Obese.  Alert and oriented, No acute distress. HEENT: [place] AT, mask in place.  Trachea midline, no masses. Cardiovascular: No clubbing, cyanosis, or edema. Respiratory: Normal respiratory effort, no increased work of breathing. Neurologic: Grossly intact, no focal deficits, moving all 4 extremities. Psychiatric: Normal mood and affect.  Laboratory Data: Lab Results  Component Value Date   WBC 12.3 (H) 08/12/2015   HGB 13.8 08/12/2015   HCT 43.1 08/12/2015   MCV 86.8 08/12/2015   PLT 283 08/12/2015    Lab Results  Component Value Date   CREATININE 1.82 (H) 08/12/2015    No results found for: PSA  No  results found for: TESTOSTERONE  No results found for: HGBA1C  No results found for: TSH  No results found for: CHOL, HDL, CHOLHDL, VLDL, LDLCALC  Lab Results  Component Value Date   AST 24 08/12/2015   Lab Results  Component Value Date   ALT 22 08/12/2015   No components found for: ALKALINEPHOPHATASE No components found for: BILIRUBINTOTAL  No results found for: ESTRADIOL  Urinalysis Component     Latest Ref Rng & Units 07/26/2019  Color, Urine     YELLOW YELLOW  Appearance     CLEAR CLOUDY (A)  Specific Gravity, Urine     1.005 - 1.030 1.020  pH     5.0 - 8.0 5.0  Glucose, UA     NEGATIVE mg/dL NEGATIVE  Hgb urine dipstick     NEGATIVE LARGE (A)  Bilirubin Urine     NEGATIVE NEGATIVE  Ketones, ur     NEGATIVE mg/dL NEGATIVE  Protein     NEGATIVE mg/dL 100 (A)  Nitrite     NEGATIVE NEGATIVE  Leukocytes,Ua     NEGATIVE SMALL (A)  Squamous Epithelial / LPF     0 - 5 0-5  WBC, UA     0 - 5 WBC/hpf >50  RBC / HPF     0 - 5 RBC/hpf >50  Bacteria, UA     NONE SEEN FEW (A)    I have reviewed the labs.   Pertinent Imaging: CLINICAL DATA:  Left flank pain for 2 weeks with hematuria.  EXAM: CT ABDOMEN AND PELVIS WITHOUT CONTRAST  TECHNIQUE: Multidetector CT imaging of the abdomen and pelvis  was performed following the standard protocol without IV contrast.  COMPARISON:  04/13/2018.  FINDINGS: Lower chest: Peripheral high density pulmonary parenchymal nodularity, as before, possibly postinfectious or post inflammatory in etiology. Heart is enlarged. Coronary artery calcification. No pericardial or pleural effusion.  Hepatobiliary: Liver is unremarkable. Cholecystectomy. No biliary ductal dilatation.  Pancreas: Negative.  Spleen: Negative.  Adrenals/Urinary Tract: Adrenal glands are unremarkable. Low-attenuation lesion in the right kidney measures 3.3 cm and is likely a cyst. Stones are seen in the right kidney, including within the right renal pelvis, measuring 13 mm. There may be mild associated pelvocaliectasis and adjacent haziness. No left renal stones. Ureters are decompressed. Bladder is fairly low in volume.  Stomach/Bowel: Stomach, small bowel and colon are unremarkable. Appendix is not readily visualized.  Vascular/Lymphatic: Atherosclerotic calcification of the aorta without aneurysm. No pathologically enlarged lymph nodes.  Reproductive: Prostate is visualized.  Other: Small umbilical and bilateral inguinal hernias contain fat. No free fluid.  Musculoskeletal: Degenerative changes in the spine. No worrisome lytic or sclerotic lesions.  IMPRESSION: 1. Right renal stones with a 13 mm stone in the right renal pelvis. Associated right pelvocaliectasis and surrounding haziness, suggesting slight obstruction. 2. No left renal stones or obstruction. 3. Aortic atherosclerosis (ICD10-I70.0). Coronary artery calcification.   Electronically Signed   By: Lorin Picket M.D.   On: 07/26/2019 13:34  I have independently reviewed the films and note the right UPJ stone and the lower right renal pole stone.    Assessment & Plan:    1. Right UPJ renal stones CT Renal stone study reveals a 13 mm right UPJ stone associated with right  pelvocaliectasis and a 8 mm right lower pole stone Recommended to seek further treatment in the ED for pain control and closer monitoring as he has poor renal function, has been erratic blood sugars at home and at risk  for developing sepsis He is to remain NPO as he may likely need emergent stent placement   2. Microscopic hematuria UA > 50 RBC's - sent for culture   3. Left flank pain ? Referred pain vs MSK as no stones are seen on left side   Return for To the ED .  These notes generated with voice recognition software. I apologize for typographical errors.  Zara Council, PA-C  [facility] [address]  [place], [place] [address] [phone]

## 2019-07-26 NOTE — Progress Notes (Deleted)
07/26/2019 8:41 AM   James Clarke 1940/10/16 [id number]  Referring provider: Sofie Hartigan, [place],  [place] [address]  No chief complaint on file.   HPI: James Clarke is a 79 year old male with a right renal stone who presents today with the complaint of left lower back pain.   KUB 07/26/2019 ***  UA 07/26/2019 ***    PMH: Past Medical History:  Diagnosis Date  . Bell's palsy    SEVERAL TIMES  . Bell's palsy   . Cancer ([place])    SKIN  . Diabetes mellitus without complication ([place])   . Dyspnea    DOE  . Hyperlipemia   . Hypertension   . Neuropathy   . Obesity   . Sleep apnea    C PAP    Surgical History: Past Surgical History:  Procedure Laterality Date  . APPENDECTOMY    . CATARACT EXTRACTION W/PHACO Left 05/20/2017   Procedure: CATARACT EXTRACTION PHACO AND INTRAOCULAR LENS PLACEMENT (IOC);  Surgeon: Birder Robson, MD;  Location: [facility] ORS;  Service: Ophthalmology;  Laterality: Left;  [place] 00:32.0 AP% 15.5 CDE 4.97 Fluid Pack Lot # [id number]  . CATARACT EXTRACTION W/PHACO Right 06/10/2017   Procedure: CATARACT EXTRACTION PHACO AND INTRAOCULAR LENS PLACEMENT ([place]);  Surgeon: Birder Robson, MD;  Location: [facility] ORS;  Service: Ophthalmology;  Laterality: Right;  [place]  00:51 AP% 15.4 CDE 7.95 Fluid pack lot # [id number]  . CHOLECYSTECTOMY    . FRACTURE SURGERY     WRIST  . HAND SURGERY    . HERNIA REPAIR    . TONSILLECTOMY      Home Medications:  Allergies as of 07/26/2019      Reactions   Codeine Nausea And Vomiting   Doxycycline    Erythromycin Rash      Medication List       Accurate as of July 26, 2019  8:41 AM. If you have any questions, ask your nurse or doctor.        albuterol 108 (90 Base) MCG/ACT inhaler Commonly known as: VENTOLIN HFA Inhale 2 puffs into the lungs every 6 (six) hours as needed for wheezing or shortness of breath.   amLODipine-olmesartan 5-40 MG tablet Commonly known as: AZOR Take 1  tablet by mouth daily.   calcium carbonate 1250 (500 Ca) MG chewable tablet Commonly known as: OS-CAL Chew 1 tablet by mouth daily.   chlorthalidone 25 MG tablet Commonly known as: HYGROTON Take 25 mg by mouth daily.   fluticasone 50 MCG/ACT nasal spray Commonly known as: FLONASE Place 2 sprays daily as needed into both nostrils for allergies. For allergies.   furosemide 40 MG tablet Commonly known as: LASIX   insulin aspart 100 UNIT/ML injection Commonly known as: novoLOG Inject 30-80 Units See admin instructions into the skin. 45 units with breakfast, 30 units with lunch, and 80 units with supper   insulin detemir 100 UNIT/ML injection Commonly known as: LEVEMIR Inject 90 Units into the skin at bedtime.   lovastatin 40 MG tablet Commonly known as: MEVACOR Take 40 mg by mouth daily with supper.   metoprolol succinate 50 MG 24 hr tablet Commonly known as: TOPROL-XL Take 25 mg by mouth daily.   naproxen 375 MG tablet Commonly known as: NAPROSYN Take 1 tablet (375 mg total) by mouth 2 (two) times daily.   potassium chloride SA 20 MEQ tablet Commonly known as: KLOR-CON Take 20 mEq by mouth daily.  Allergies:  Allergies  Allergen Reactions  . Codeine Nausea And Vomiting  . Doxycycline   . Erythromycin Rash    Family History: Family History  Problem Relation Age of Onset  . Emphysema Mother   . COPD Mother   . Heart disease Mother   . Brain cancer Father     Social History:  reports that he has never smoked. He has never used smokeless tobacco. He reports previous drug use. He reports that he does not drink alcohol.  ROS:                                        Physical Exam: There were no vitals taken for this visit.  Constitutional:  Well nourished. Alert and oriented, No acute distress. HEENT: [place] AT, moist mucus membranes.  Trachea midline, no masses. Cardiovascular: No clubbing, cyanosis, or edema. Respiratory: Normal  respiratory effort, no increased work of breathing. GI: Abdomen is soft, non tender, non distended, no abdominal masses. Liver and spleen not palpable.  No hernias appreciated.  Stool sample for occult testing is not indicated.   GU: No CVA tenderness.  No bladder fullness or masses.  Patient with circumcised/uncircumcised phallus. ***Foreskin easily retracted***  Urethral meatus is patent.  No penile discharge. No penile lesions or rashes. Scrotum without lesions, cysts, rashes and/or edema.  Testicles are located scrotally bilaterally. No masses are appreciated in the testicles. Left and right epididymis are normal. Rectal: Patient with  normal sphincter tone. Anus and perineum without scarring or rashes. No rectal masses are appreciated. Prostate is approximately *** grams, *** nodules are appreciated. Seminal vesicles are normal. Skin: No rashes, bruises or suspicious lesions. Lymph: No cervical or inguinal adenopathy. Neurologic: Grossly intact, no focal deficits, moving all 4 extremities. Psychiatric: Normal mood and affect.  Laboratory Data: Lab Results  Component Value Date   WBC 12.3 (H) 08/12/2015   HGB 13.8 08/12/2015   HCT 43.1 08/12/2015   MCV 86.8 08/12/2015   PLT 283 08/12/2015    Lab Results  Component Value Date   CREATININE 1.82 (H) 08/12/2015    No results found for: PSA  No results found for: TESTOSTERONE  No results found for: HGBA1C  No results found for: TSH  No results found for: CHOL, HDL, CHOLHDL, VLDL, LDLCALC  Lab Results  Component Value Date   AST 24 08/12/2015   Lab Results  Component Value Date   ALT 22 08/12/2015   No components found for: ALKALINEPHOPHATASE No components found for: BILIRUBINTOTAL  No results found for: ESTRADIOL  Urinalysis ***  I have reviewed the labs.   Pertinent Imaging: *** I have independently reviewed the films.    Assessment & Plan:  ***  1. Right renal stone ***  No follow-ups on file.  These  notes generated with voice recognition software. I apologize for typographical errors.  Zara Council, PA-C  [facility] [address]  [place], [place] [address] [phone]

## 2019-07-26 NOTE — H&P (Signed)
H&P  Chief Complaint: Left-sided back pain, right ureteropelvic junction calculus, AKI on CKD  History of Present Illness: 79 year old male with a history of CKD stage III secondary to diabetes.  He has a history of renal calculi.  He has never required intervention.  He came to the office this morning with severe left sided lower back pain.  Urinalysis was consistent with possible infection.  He has been afebrile with vital signs stable.  No voiding complaints.  CT scan showed no abnormalities on the left which is the site of his pain.  However, he has a right ureteropelvic junction calculus with some mild surrounding stranding.  Renal function is 2.84 with a GFR of 20.  He is Covid negative.  Mild leukocytosis with a white blood cell count of 13.8.  He denies any right-sided flank pain.  Past Medical History:  Diagnosis Date  . Tavarion Babington's palsy    SEVERAL TIMES  . Berel Najjar's palsy   . Cancer ([place])    SKIN  . Diabetes mellitus without complication ([place])   . Dyspnea    DOE  . Hyperlipemia   . Hypertension   . Neuropathy   . Obesity   . Sleep apnea    C PAP   Past Surgical History:  Procedure Laterality Date  . APPENDECTOMY    . CATARACT EXTRACTION W/PHACO Left 05/20/2017   Procedure: CATARACT EXTRACTION PHACO AND INTRAOCULAR LENS PLACEMENT (IOC);  Surgeon: Birder Robson, MD;  Location: [facility] ORS;  Service: Ophthalmology;  Laterality: Left;  [place] 00:32.0 AP% 15.5 CDE 4.97 Fluid Pack Lot # [id number]  . CATARACT EXTRACTION W/PHACO Right 06/10/2017   Procedure: CATARACT EXTRACTION PHACO AND INTRAOCULAR LENS PLACEMENT ([place]);  Surgeon: Birder Robson, MD;  Location: [facility] ORS;  Service: Ophthalmology;  Laterality: Right;  [place]  00:51 AP% 15.4 CDE 7.95 Fluid pack lot # [id number]  . CHOLECYSTECTOMY    . FRACTURE SURGERY     WRIST  . HAND SURGERY    . HERNIA REPAIR    . TONSILLECTOMY      Home Medications:  (Not in a hospital admission)  Allergies:  Allergies  Allergen Reactions  .  Codeine Nausea And Vomiting  . Doxycycline   . Erythromycin Rash    Family History  Problem Relation Age of Onset  . Emphysema Mother   . COPD Mother   . Heart disease Mother   . Brain cancer Father    Social History:  reports that he has never smoked. He has never used smokeless tobacco. He reports previous drug use. He reports that he does not drink alcohol.  ROS: A complete review of systems was performed.  All systems are negative except for pertinent findings as noted. ROS   Physical Exam:  Vital signs in last 24 hours: Temp:  [98.3 F (36.8 C)-98.4 F (36.9 C)] 98.3 F (36.8 C) (01/04 1611) Pulse Rate:  [77-87] 77 (01/04 1611) Resp:  [18] 18 (01/04 1611) BP: (126-144)/(59-69) 131/59 (01/04 1611) SpO2:  [98 %-99 %] 98 % (01/04 1611) Weight:  [150.1 kg] 150.1 kg (01/04 1514) General:  Alert and oriented, No acute distress HEENT: Normocephalic, atraumatic Neck: No JVD or lymphadenopathy Cardiovascular: Regular rate and rhythm Lungs: Regular rate and effort Abdomen: Soft, nontender, nondistended, no abdominal masses Back: No CVA tenderness.  He does have some left lower back paraspinal tenderness consistent with muscle strain Extremities: No edema Neurologic: Grossly intact  Laboratory Data:  Results for orders placed or performed during the hospital encounter of 07/26/19 (from  the past 24 hour(s))  Basic metabolic panel     Status: Abnormal   Collection Time: 07/26/19  3:16 PM  Result Value Ref Range   Sodium 138 135 - 145 mmol/L   Potassium 3.8 3.5 - 5.1 mmol/L   Chloride 102 98 - 111 mmol/L   CO2 25 22 - 32 mmol/L   Glucose, Bld 137 (H) 70 - 99 mg/dL   BUN 84 (H) 8 - 23 mg/dL   Creatinine, Ser 2.84 (H) 0.61 - 1.24 mg/dL   Calcium 9.6 8.9 - 10.3 mg/dL   GFR calc non Af Amer 20 (L) >60 mL/min   GFR calc Af Amer 23 (L) >60 mL/min   Anion gap 11 5 - 15  CBC     Status: Abnormal   Collection Time: 07/26/19  3:16 PM  Result Value Ref Range   WBC 13.8 (H) 4.0  - 10.5 K/uL   RBC 4.32 4.22 - 5.81 MIL/uL   Hemoglobin 12.4 (L) 13.0 - 17.0 g/dL   HCT 38.7 (L) 39.0 - 52.0 %   MCV 89.6 80.0 - 100.0 fL   MCH 28.7 26.0 - 34.0 pg   MCHC 32.0 30.0 - 36.0 g/dL   RDW 13.3 11.5 - 15.5 %   Platelets 326 150 - 400 K/uL   nRBC 0.0 0.0 - 0.2 %  Glucose, capillary     Status: Abnormal   Collection Time: 07/26/19  3:20 PM  Result Value Ref Range   Glucose-Capillary 116 (H) 70 - 99 mg/dL   Comment 1 Notify RN    Comment 2 Document in Chart   Respiratory Panel by RT PCR (Flu A&B, Covid) - Nasopharyngeal Swab     Status: None   Collection Time: 07/26/19  4:24 PM   Specimen: Nasopharyngeal Swab  Result Value Ref Range   SARS Coronavirus 2 by RT PCR NEGATIVE NEGATIVE   Influenza A by PCR NEGATIVE NEGATIVE   Influenza B by PCR NEGATIVE NEGATIVE   Recent Results (from the past 240 hour(s))  Respiratory Panel by RT PCR (Flu A&B, Covid) - Nasopharyngeal Swab     Status: None   Collection Time: 07/26/19  4:24 PM   Specimen: Nasopharyngeal Swab  Result Value Ref Range Status   SARS Coronavirus 2 by RT PCR NEGATIVE NEGATIVE Final    Comment: (NOTE) SARS-CoV-2 target nucleic acids are NOT DETECTED. The SARS-CoV-2 RNA is generally detectable in upper respiratoy specimens during the acute phase of infection. The lowest concentration of SARS-CoV-2 viral copies this assay can detect is 131 copies/mL. A negative result does not preclude SARS-Cov-2 infection and should not be used as the sole basis for treatment or other patient management decisions. A negative result may occur with  improper specimen collection/handling, submission of specimen other than nasopharyngeal swab, presence of viral mutation(s) within the areas targeted by this assay, and inadequate number of viral copies (<131 copies/mL). A negative result must be combined with clinical observations, patient history, and epidemiological information. The expected result is Negative. Fact Sheet for  Patients:  PinkCheek.be Fact Sheet for Healthcare Providers:  GravelBags.it This test is not yet ap proved or cleared by the [place] FDA and  has been authorized for detection and/or diagnosis of SARS-CoV-2 by FDA under an Emergency Use Authorization (EUA). This EUA will remain  in effect (meaning this test can be used) for the duration of the COVID-19 declaration under Section 564(b)(1) of the Act, 21 U.S.C. section 360bbb-3(b)(1), unless the authorization is terminated or revoked  sooner.    Influenza A by PCR NEGATIVE NEGATIVE Final   Influenza B by PCR NEGATIVE NEGATIVE Final    Comment: (NOTE) The Xpert Xpress SARS-CoV-2/FLU/RSV assay is intended as an aid in  the diagnosis of influenza from Nasopharyngeal swab specimens and  should not be used as a sole basis for treatment. Nasal washings and  aspirates are unacceptable for Xpert Xpress SARS-CoV-2/FLU/RSV  testing. Fact Sheet for Patients: PinkCheek.be Fact Sheet for Healthcare Providers: GravelBags.it This test is not yet approved or cleared by the [place] FDA and  has been authorized for detection and/or diagnosis of SARS-CoV-2 by  FDA under an Emergency Use Authorization (EUA). This EUA will remain  in effect (meaning this test can be used) for the duration of the  Covid-19 declaration under Section 564(b)(1) of the Act, 21  U.S.C. section 360bbb-3(b)(1), unless the authorization is  terminated or revoked. Performed at [facility], [address], [place], [place] [address]    Creatinine: Recent Labs    07/26/19 1516  CREATININE 2.84*   CT scan personally reviewed and is detailed in the history of present illness.  Impression/Assessment:  Right ureteropelvic junction calculus Acute renal insufficiency on top of chronic renal insufficiency stage III, followed by a  nephrologist Left-sided flank pain likely secondary to muscle strain  Plan:  I presented the options of observation with admission with light IV fluids versus proceeding with a ureteral stent followed by ureteroscopy in 1 to 2 weeks.  He would like to go ahead and proceed with a right ureteral stent placement.  He understands this will not help his left-sided back pain and that this is likely secondary to a muscle strain.  1 g Rocephin for periprocedural antibiotics and also follow-up on urine culture.  Repeat BMP in the morning.  Continue fluids overnight.  Sliding scale insulin for history of diabetes.  Marton Redwood, III 07/26/2019, 5:58 PM

## 2019-07-26 NOTE — Anesthesia Procedure Notes (Signed)
Procedure Name: Intubation Date/Time: 07/26/2019 6:42 PM Performed by: Lendon Colonel, CRNA Pre-anesthesia Checklist: Patient identified, Patient being monitored, Timeout performed, Emergency Drugs available and Suction available Patient Re-evaluated:Patient Re-evaluated prior to induction Oxygen Delivery Method: Circle system utilized Preoxygenation: Pre-oxygenation with 100% oxygen Induction Type: IV induction Ventilation: Mask ventilation without difficulty Laryngoscope Size: 3 and McGraph Grade View: Grade I Tube type: Oral Tube size: 7.0 mm Number of attempts: 1 Airway Equipment and Method: Stylet and Patient positioned with wedge pillow Placement Confirmation: ETT inserted through vocal cords under direct vision,  positive ETCO2 and breath sounds checked- equal and bilateral Secured at: 21 cm Tube secured with: Tape Dental Injury: Teeth and Oropharynx as per pre-operative assessment

## 2019-07-26 NOTE — OR Nursing (Signed)
Patient taken to pre-op 23 to consolidate all the holding patients in one place.

## 2019-07-26 NOTE — ED Provider Notes (Signed)
[facility] Emergency Department Provider Note   ____________________________________________   First MD Initiated Contact with Patient 07/26/19 1613     (approximate)  I have reviewed the triage vital signs and the nursing notes.   HISTORY  Chief Complaint Flank Pain    HPI James Clarke is a 79 y.o. male here for evaluation for left lower back pain.  He was reportedly sent from urology clinic he went there today, they recommend he come here as he may need a urologic procedure.  Patient reports he went to the urology clinic in about 1 week of pain in his left lower back.  It is worsened when he moves and goes away when he lays still.  Not associated any pain burning or discomfort.  No fevers or chills.  He also was told that his kidney test is gotten worse and that urology recommended he come to the hospital to be seen by them  He had a CT scan today at outpatient  Denies exposure to Covid.   Past Medical History:  Diagnosis Date  . Bell's palsy    SEVERAL TIMES  . Bell's palsy   . Cancer ([place])    SKIN  . Diabetes mellitus without complication ([place])   . Dyspnea    DOE  . Hyperlipemia   . Hypertension   . Neuropathy   . Obesity   . Sleep apnea    C PAP    Patient Active Problem List   Diagnosis Date Noted  . Anemia in chronic kidney disease 07/26/2019  . Chronic kidney disease, stage III (moderate) 07/26/2019  . Edema of lower extremity 07/26/2019  . Malignant essential hypertension 07/26/2019  . Secondary hyperparathyroidism of renal origin ([place]) 07/26/2019  . Ureteral calculus 07/26/2019  . Old complex tear of medial meniscus of right knee 10/07/2018  . Primary osteoarthritis of right knee 10/07/2018  . Fatty liver 04/16/2018  . Aortic atherosclerosis ([place]) 04/16/2018  . Bilateral leg edema 03/17/2018  . Morbid obesity due to excess calories ([place]) 11/11/2014  . Microalbuminuria 11/11/2014  . Long-term insulin use ([place])  11/11/2014  . Perennial allergic rhinitis 09/12/2014  . Type 2 diabetes mellitus with renal manifestations ([place]) 03/19/2014  . Sleep apnea 03/19/2014  . Hypertension 03/19/2014  . Hyperlipemia 03/19/2014    Past Surgical History:  Procedure Laterality Date  . APPENDECTOMY    . CATARACT EXTRACTION W/PHACO Left 05/20/2017   Procedure: CATARACT EXTRACTION PHACO AND INTRAOCULAR LENS PLACEMENT (IOC);  Surgeon: Birder Robson, MD;  Location: [facility] ORS;  Service: Ophthalmology;  Laterality: Left;  [place] 00:32.0 AP% 15.5 CDE 4.97 Fluid Pack Lot # [id number]  . CATARACT EXTRACTION W/PHACO Right 06/10/2017   Procedure: CATARACT EXTRACTION PHACO AND INTRAOCULAR LENS PLACEMENT ([place]);  Surgeon: Birder Robson, MD;  Location: [facility] ORS;  Service: Ophthalmology;  Laterality: Right;  [place]  00:51 AP% 15.4 CDE 7.95 Fluid pack lot # [id number]  . CHOLECYSTECTOMY    . FRACTURE SURGERY     WRIST  . HAND SURGERY    . HERNIA REPAIR    . TONSILLECTOMY      Prior to Admission medications   Medication Sig Start Date End Date Taking? Authorizing Provider  amLODipine-olmesartan (AZOR) 5-40 MG tablet Take 1 tablet by mouth daily. 04/25/17  Yes [provider]  calcium carbonate (OS-CAL) 1250 (500 Ca) MG chewable tablet Chew 1 tablet by mouth daily.   Yes [provider]  chlorthalidone (HYGROTON) 25 MG tablet Take 25 mg by mouth daily.  02/13/17  Yes [provider]  fluticasone (FLONASE) 50 MCG/ACT nasal spray Place 2 sprays daily as needed into both nostrils for allergies. For allergies. 03/17/17  Yes [provider]  furosemide (LASIX) 40 MG tablet Take 40 mg by mouth every other day.  05/06/18  Yes [provider]  insulin aspart (NOVOLOG) 100 UNIT/ML injection Inject 30-80 Units See admin instructions into the skin. 45 units with breakfast, 30 units with lunch, and 80 units with supper    Yes [provider]  insulin detemir (LEVEMIR) 100 UNIT/ML injection  Inject 90 Units into the skin at bedtime.   Yes [provider]  lovastatin (MEVACOR) 40 MG tablet Take 40 mg by mouth daily with supper. 04/28/17  Yes [provider]  metoprolol succinate (TOPROL-XL) 50 MG 24 hr tablet Take 25 mg by mouth daily. 03/31/17  Yes [provider]  potassium chloride SA (K-DUR,KLOR-CON) 20 MEQ tablet Take 20 mEq by mouth daily. 04/28/17  Yes [provider]  albuterol (PROVENTIL HFA;VENTOLIN HFA) 108 (90 Base) MCG/ACT inhaler Inhale 2 puffs into the lungs every 6 (six) hours as needed for wheezing or shortness of breath.    [provider]  naproxen (NAPROSYN) 375 MG tablet Take 1 tablet (375 mg total) by mouth 2 (two) times daily. Patient not taking: Reported on 07/26/2019 05/15/18   Lorin Picket, PA-C    Allergies Codeine, Doxycycline, and Erythromycin  Family History  Problem Relation Age of Onset  . Emphysema Mother   . COPD Mother   . Heart disease Mother   . Brain cancer Father     Social History Social History   Tobacco Use  . Smoking status: Never Smoker  . Smokeless tobacco: Never Used  Substance Use Topics  . Alcohol use: No  . Drug use: Not Currently    Review of Systems Constitutional: No fever/chills Eyes: No visual changes. ENT: No sore throat. Cardiovascular: Denies chest pain. Respiratory: Denies shortness of breath. Gastrointestinal: No abdominal pain.  Pain in his left lower back, worse when he moves his leg radiates out from the left lower back at times.  Denies right-sided pain no flank pain. Genitourinary: Negative for dysuria. Musculoskeletal: Negative for back pain except some in the left lower back. Skin: Negative for rash. Neurological: Negative for headaches, areas of focal weakness or numbness.    ____________________________________________   PHYSICAL EXAM:  VITAL SIGNS: ED Triage Vitals [07/26/19 1514]  Enc Vitals Group     BP 126/69     Pulse Rate 83     Resp  18     Temp 98.4 F (36.9 C)     Temp Source Oral     SpO2 99 %     Weight (!) 331 lb (150.1 kg)     Height 5\' 10"  (1.778 m)     Head Circumference      Peak Flow      Pain Score 10     Pain Loc      Pain Edu?      Excl. in [place]?     Constitutional: Alert and oriented. Well appearing and in no acute distress.  Does appear in pain when he sits up or moves about when he does this he starts getting sharp pains in his lower back when he sits still he is 0 pain Eyes: Conjunctivae are normal. Head: Atraumatic. Nose: No congestion/rhinnorhea. Mouth/Throat: Mucous membranes are moist. Neck: No stridor.  Cardiovascular: Normal rate, regular rhythm. Grossly normal  heart sounds.  Good peripheral circulation. Respiratory: Normal respiratory effort.  No retractions. Lungs CTAB. Gastrointestinal: Soft and nontender. No distention.  Mild tenderness over the left costovertebral angle.  None on the right. Musculoskeletal: No lower extremity tenderness nor edema. Neurologic:  Normal speech and language. No gross focal neurologic deficits are appreciated.  Skin:  Skin is warm, dry and intact. No rash noted. Psychiatric: Mood and affect are normal. Speech and behavior are normal.  ____________________________________________   LABS (all labs ordered are listed, but only abnormal results are displayed)  Labs Reviewed  BASIC METABOLIC PANEL - Abnormal; Notable for the following components:      Result Value   Glucose, Bld 137 (*)    BUN 84 (*)    Creatinine, Ser 2.84 (*)    GFR calc non Af Amer 20 (*)    GFR calc Af Amer 23 (*)    All other components within normal limits  CBC - Abnormal; Notable for the following components:   WBC 13.8 (*)    Hemoglobin 12.4 (*)    HCT 38.7 (*)    All other components within normal limits  GLUCOSE, CAPILLARY - Abnormal; Notable for the following components:   Glucose-Capillary 116 (*)    All other components within normal limits  RESPIRATORY PANEL BY RT  PCR (FLU A&B, COVID)   ____________________________________________  EKG   ____________________________________________  RADIOLOGY egarding the procedure.   CT RENAL STONE STUDY  Result Date: 07/26/2019 CLINICAL DATA:  Left flank pain for 2 weeks with hematuria. EXAM: CT ABDOMEN AND PELVIS WITHOUT CONTRAST TECHNIQUE: Multidetector CT imaging of the abdomen and pelvis was performed following the standard protocol without IV contrast. COMPARISON:  04/13/2018. FINDINGS: Lower chest: Peripheral high density pulmonary parenchymal nodularity, as before, possibly postinfectious or post inflammatory in etiology. Heart is enlarged. Coronary artery calcification. No pericardial or pleural effusion. Hepatobiliary: Liver is unremarkable. Cholecystectomy. No biliary ductal dilatation. Pancreas: Negative. Spleen: Negative. Adrenals/Urinary Tract: Adrenal glands are unremarkable. Low-attenuation lesion in the right kidney measures 3.3 cm and is likely a cyst. Stones are seen in the right kidney, including within the right renal pelvis, measuring 13 mm. There may be mild associated pelvocaliectasis and adjacent haziness. No left renal stones. Ureters are decompressed. Bladder is fairly low in volume. Stomach/Bowel: Stomach, small bowel and colon are unremarkable. Appendix is not readily visualized. Vascular/Lymphatic: Atherosclerotic calcification of the aorta without aneurysm. No pathologically enlarged lymph nodes. Reproductive: Prostate is visualized. Other: Small umbilical and bilateral inguinal hernias contain fat. No free fluid. Musculoskeletal: Degenerative changes in the spine. No worrisome lytic or sclerotic lesions. IMPRESSION: 1. Right renal stones with a 13 mm stone in the right renal pelvis. Associated right pelvocaliectasis and surrounding haziness, suggesting slight obstruction. 2. No left renal stones or obstruction. 3. Aortic atherosclerosis (ICD10-I70.0). Coronary artery calcification. Electronically  Signed   By: Lorin Picket M.D.   On: 07/26/2019 13:34   Test above reviewed, discussed with Dr. Gloriann Loan ____________________________________________   PROCEDURES  Procedure(s) performed: None  Procedures  Critical Care performed: No  ____________________________________________   INITIAL IMPRESSION / ASSESSMENT AND PLAN / ED COURSE  Pertinent labs & imaging results that were available during my care of the patient were reviewed by me and considered in my medical decision making (see chart for details).   Patient is reevaluation 1 week left lower back pain.  Seems very possibly musculoskeletal in nature but does have left-sided CVA tenderness and his urinalysis is concerning for possible concomitant infection.  This would suggest possibility of pyelonephritis though his very positional nature seem to suggest musculoskeletal.  Do not think this correlates much with his right-sided renal disease except for his worsening renal function and elevated BUN.  He also has leukocytosis.  Obtain consult from urology Dr. Gloriann Loan seen and evaluated, and will be admitting the urology service due to acute kidney injury and further work-up under his team  James Clarke was evaluated in Emergency Department on 07/26/2019 for the symptoms described in the history of present illness. He was evaluated in the context of the global COVID-19 pandemic, which necessitated consideration that the patient might be at risk for infection with the SARS-CoV-2 virus that causes COVID-19. Institutional protocols and algorithms that pertain to the evaluation of patients at risk for COVID-19 are in a state of rapid change based on information released by regulatory bodies including the CDC and federal and state organizations. These policies and algorithms were followed during the patient's care in the ED.        ____________________________________________   FINAL CLINICAL IMPRESSION(S) / ED DIAGNOSES  Final diagnoses:   Acute kidney injury [facility])        Note:  This document was prepared using Dragon voice recognition software and may include unintentional dictation errors       Delman Kitten, MD 07/26/19 1825

## 2019-07-26 NOTE — Anesthesia Preprocedure Evaluation (Signed)
Anesthesia Evaluation  Patient identified by MRN, date of birth, ID band Patient awake    Reviewed: Allergy & Precautions, H&P , NPO status , Patient's Chart, lab work & pertinent test results, reviewed documented beta blocker date and time   Airway Mallampati: III  TM Distance: >3 FB Neck ROM: full    Dental  (+) Teeth Intact   Pulmonary shortness of breath and with exertion, sleep apnea ,    Pulmonary exam normal        Cardiovascular Exercise Tolerance: Poor hypertension, On Medications negative cardio ROS Normal cardiovascular exam Rate:Normal     Neuro/Psych  Neuromuscular disease negative psych ROS   GI/Hepatic negative GI ROS, Neg liver ROS,   Endo/Other  negative endocrine ROSdiabetes, Well Controlled, Type 1, Insulin Dependent  Renal/GU Renal disease  negative genitourinary   Musculoskeletal   Abdominal   Peds  Hematology  (+) Blood dyscrasia, anemia ,   Anesthesia Other Findings   Reproductive/Obstetrics negative OB ROS                             Anesthesia Physical Anesthesia Plan  ASA: III and emergent  Anesthesia Plan: General LMA   Post-op Pain Management:    Induction:   PONV Risk Score and Plan:   Airway Management Planned:   Additional Equipment:   Intra-op Plan:   Post-operative Plan:   Informed Consent: I have reviewed the patients History and Physical, chart, labs and discussed the procedure including the risks, benefits and alternatives for the proposed anesthesia with the patient or authorized representative who has indicated his/her understanding and acceptance.       Plan Discussed with: CRNA  Anesthesia Plan Comments:         Anesthesia Quick Evaluation

## 2019-07-26 NOTE — Op Note (Signed)
Operative Note  Preoperative diagnosis:  1.  Right ureteropelvic junction calculus 2.  Acute renal insufficiency  Postoperative diagnosis: 1.  Same  Procedure(s): 1.  Cystoscopy with right retrograde pyelogram, right ureteral stent placement  Surgeon: Link Snuffer, MD  Assistants: None  Anesthesia: General  Complications: None immediate  EBL: Minimal  Specimens: 1.  None  Drains/Catheters: 1.  6 x 26 double-J ureteral stent on the right  Intraoperative findings: 1.  Normal anterior urethra 2.  Borderline obstructing prostate 3.  No obvious bladder lesions. 4.  Right retrograde pyelogram revealed filling defect in the renal pelvis consistent with known stone.  There was no significant hydronephrosis.  Indication: 79 year old male with left-sided flank pain and acute renal insufficiency was found to have a right renal pelvis calculus with evidence of possible obstruction.  Given his acute renal insufficiency, he elected to undergo the above operation.  Description of procedure:  The patient was identified and consent was obtained.  The patient was taken to the operating room and placed in the supine position.  The patient was placed under general anesthesia.  Perioperative antibiotics were administered.  The patient was placed in dorsal lithotomy.  Patient was prepped and draped in a standard sterile fashion and a timeout was performed.  A 21 French rigid cystoscope was advanced into the urethra and into the bladder.  Complete cystoscopy was performed with findings noted above.  The right ureter was cannulated with a open-ended ureteral catheter and a retrograde pyelogram was performed with findings noted above.  A sensor wire was advanced up to the kidney under fluoroscopic guidance.  A 6 x 26 double-J ureteral stent was then placed in standard fashion.  Fluoroscopy confirmed proximal placement and direct visualization confirmed a good coil within the bladder.  I drained the  bladder and withdrew the scope.  Patient tolerated procedure well and was stable postoperative.  Plan: Monitor overnight.  We will plan for ureteroscopy with laser lithotripsy and ureteral stent placement/exchange in 1 to 2 weeks.

## 2019-07-26 NOTE — ED Notes (Signed)
Patient reports had renal stone few years ago same feeling as per pain.., seen by urgy care urine specimen had a lot of blood, sent to ed to r/o renal stone. CT completed. Awaiting md plan of care.

## 2019-07-26 NOTE — ED Triage Notes (Signed)
Pt c/o left flank pain for the past couple of weeks and was seen a urologist today. Had an outpatient CT

## 2019-07-26 NOTE — ED Triage Notes (Signed)
PT sent over from [facility], pt has been having left sided flank pain so a CT was ordered and it was noted that the patient has RIGHT UPJ stone. PT sent for blood work and pain control.

## 2019-07-26 NOTE — ED Notes (Signed)
Patient swabbed for covid.

## 2019-07-26 NOTE — ED Notes (Signed)
Inform consent signed and given to OR tech.

## 2019-07-27 DIAGNOSIS — N201 Calculus of ureter: Secondary | ICD-10-CM

## 2019-07-27 LAB — BASIC METABOLIC PANEL
Anion gap: 11 (ref 5–15)
BUN: 73 mg/dL — ABNORMAL HIGH (ref 8–23)
CO2: 24 mmol/L (ref 22–32)
Calcium: 8.6 mg/dL — ABNORMAL LOW (ref 8.9–10.3)
Chloride: 104 mmol/L (ref 98–111)
Creatinine, Ser: 2.55 mg/dL — ABNORMAL HIGH (ref 0.61–1.24)
GFR calc Af Amer: 27 mL/min — ABNORMAL LOW (ref >60)
GFR calc non Af Amer: 23 mL/min — ABNORMAL LOW (ref >60)
Glucose, Bld: 216 mg/dL — ABNORMAL HIGH (ref 70–99)
Potassium: 3.5 mmol/L (ref 3.5–5.1)
Sodium: 139 mmol/L (ref 135–145)

## 2019-07-27 LAB — GLUCOSE, CAPILLARY: Glucose-Capillary: 227 mg/dL — ABNORMAL HIGH (ref 70–99)

## 2019-07-27 LAB — HEMOGLOBIN A1C
Hgb A1c MFr Bld: 7.1 % — ABNORMAL HIGH (ref 4.8–5.6)
Mean Plasma Glucose: 157.07 mg/dL

## 2019-07-27 MED ORDER — OXYBUTYNIN CHLORIDE 5 MG PO TABS
5.0000 mg | ORAL_TABLET | Freq: Three times a day (TID) | ORAL | Status: DC | PRN
  Filled 2019-07-27: qty 1

## 2019-07-27 MED ORDER — INSULIN ASPART 100 UNIT/ML ~~LOC~~ SOLN: 0.0000 [IU] | Freq: Three times a day (TID) | SUBCUTANEOUS | Status: DC

## 2019-07-27 MED ORDER — SODIUM CHLORIDE 0.9 % IV SOLN: INTRAVENOUS | Status: DC

## 2019-07-27 MED ORDER — ACETAMINOPHEN 325 MG PO TABS: 650.0000 mg | ORAL_TABLET | ORAL | Status: DC | PRN

## 2019-07-27 MED ORDER — BELLADONNA ALKALOIDS-OPIUM 16.2-60 MG RE SUPP: 1.0000 | Freq: Four times a day (QID) | RECTAL | Status: DC | PRN

## 2019-07-27 MED ORDER — OXYCODONE HCL 5 MG PO TABS: 5.0000 mg | ORAL_TABLET | ORAL | Status: DC | PRN

## 2019-07-27 MED ORDER — DOCUSATE SODIUM 100 MG PO CAPS
100.0000 mg | ORAL_CAPSULE | Freq: Two times a day (BID) | ORAL | Status: DC
  Administered 2019-07-27: 100 mg via ORAL
  Filled 2019-07-27 (×2): qty 1

## 2019-07-27 MED ORDER — ALBUTEROL SULFATE (2.5 MG/3ML) 0.083% IN NEBU: 2.5000 mg | INHALATION_SOLUTION | Freq: Four times a day (QID) | RESPIRATORY_TRACT | Status: DC | PRN

## 2019-07-27 MED ORDER — ONDANSETRON HCL 4 MG/2ML IJ SOLN: 4.0000 mg | INTRAMUSCULAR | Status: DC | PRN

## 2019-07-27 MED ORDER — AMLODIPINE BESYLATE 5 MG PO TABS
5.0000 mg | ORAL_TABLET | Freq: Every day | ORAL | Status: DC
  Administered 2019-07-27: 5 mg via ORAL
  Filled 2019-07-27: qty 1

## 2019-07-27 MED ORDER — SULFAMETHOXAZOLE-TRIMETHOPRIM 800-160 MG PO TABS: 1.0000 | ORAL_TABLET | Freq: Two times a day (BID) | ORAL | 0 refills | Status: AC

## 2019-07-27 MED ORDER — INSULIN ASPART 100 UNIT/ML ~~LOC~~ SOLN
SUBCUTANEOUS | Status: AC
  Administered 2019-07-27: 7 [IU] via SUBCUTANEOUS
  Filled 2019-07-27: qty 1

## 2019-07-27 MED ORDER — MORPHINE SULFATE (PF) 4 MG/ML IV SOLN: 2.0000 mg | INTRAVENOUS | Status: DC | PRN

## 2019-07-27 MED ORDER — DIPHENHYDRAMINE HCL 50 MG/ML IJ SOLN: 12.5000 mg | Freq: Four times a day (QID) | INTRAMUSCULAR | Status: DC | PRN

## 2019-07-27 MED ORDER — METOPROLOL SUCCINATE ER 25 MG PO TB24
25.0000 mg | ORAL_TABLET | Freq: Every day | ORAL | Status: DC
  Administered 2019-07-27: 25 mg via ORAL
  Filled 2019-07-27: qty 1

## 2019-07-27 MED ORDER — OXYBUTYNIN CHLORIDE 5 MG PO TABS: 5.0000 mg | ORAL_TABLET | Freq: Three times a day (TID) | ORAL | 1 refills | Status: DC | PRN

## 2019-07-27 MED ORDER — ZOLPIDEM TARTRATE 5 MG PO TABS: 5.0000 mg | ORAL_TABLET | Freq: Every evening | ORAL | Status: DC | PRN

## 2019-07-27 MED ORDER — ACETAMINOPHEN 325 MG PO TABS
ORAL_TABLET | ORAL | Status: AC
  Administered 2019-07-27: 650 mg via ORAL
  Filled 2019-07-27: qty 2

## 2019-07-27 MED ORDER — IRBESARTAN 150 MG PO TABS
300.0000 mg | ORAL_TABLET | Freq: Every day | ORAL | Status: DC
  Administered 2019-07-27: 300 mg via ORAL
  Filled 2019-07-27: qty 2

## 2019-07-27 MED ORDER — CHLORTHALIDONE 25 MG PO TABS
25.0000 mg | ORAL_TABLET | Freq: Every day | ORAL | Status: DC
  Administered 2019-07-27: 25 mg via ORAL
  Filled 2019-07-27: qty 1

## 2019-07-27 MED ORDER — AMLODIPINE-OLMESARTAN 5-40 MG PO TABS: 1.0000 | ORAL_TABLET | Freq: Every day | ORAL | Status: DC

## 2019-07-27 MED ORDER — SENNA 8.6 MG PO TABS
1.0000 | ORAL_TABLET | Freq: Two times a day (BID) | ORAL | Status: DC
  Administered 2019-07-27: 8.6 mg via ORAL
  Filled 2019-07-27 (×2): qty 1

## 2019-07-27 MED ORDER — PRAVASTATIN SODIUM 40 MG PO TABS
40.0000 mg | ORAL_TABLET | Freq: Every day | ORAL | Status: DC
  Filled 2019-07-27: qty 1

## 2019-07-27 MED ORDER — FUROSEMIDE 40 MG PO TABS
40.0000 mg | ORAL_TABLET | ORAL | Status: DC
  Administered 2019-07-27: 40 mg via ORAL
  Filled 2019-07-27: qty 1

## 2019-07-27 MED ORDER — DIPHENHYDRAMINE HCL 12.5 MG/5ML PO ELIX
12.5000 mg | ORAL_SOLUTION | Freq: Four times a day (QID) | ORAL | Status: DC | PRN
  Filled 2019-07-27: qty 5

## 2019-07-27 NOTE — Progress Notes (Signed)
Urology Inpatient Progress Note  Subjective: James Clarke is a 79 y.o. male admitted on 07/26/2019 with a 10 mm right UPJ stone and acute renal insufficiency.  He underwent urgent right ureteral stent placement with Dr. Gloriann Loan overnight.  Creatinine down today, 2.55.  UA significant for >50 WBCs/hpf, >50 RBCs/hpf, and few bacteria.  Urine culture pending, on antibiotics as below.  He is afebrile, VSS.  Today, he reports significant improvement in his pain.  He does report urinary urgency and frequency since stent placement.  He denies dysuria.  Anti-infectives: Anti-infectives (From admission, onward)   Start     Dose/Rate Route Frequency Ordered Stop   07/26/19 1630  cefTRIAXone (ROCEPHIN) 1 g in sodium chloride 0.9 % 100 mL IVPB     1 g 200 mL/hr over 30 Minutes Intravenous  Once 07/26/19 1628 07/26/19 1832      Current Facility-Administered Medications  Medication Dose Route Frequency Provider Last Rate Last Admin  . 0.9 %  sodium chloride infusion   Intravenous Continuous Marton Redwood III, MD 125 mL/hr at 07/27/19 0705 New Bag at 07/27/19 0705  . acetaminophen (TYLENOL) tablet 650 mg  650 mg Oral Q4H PRN Marton Redwood III, MD   650 mg at 07/27/19 9518  . albuterol (PROVENTIL) (2.5 MG/3ML) 0.083% nebulizer solution 2.5 mg  2.5 mg Inhalation Q6H PRN Marton Redwood III, MD      . amLODipine (NORVASC) tablet 5 mg  5 mg Oral Daily Marton Redwood III, MD   5 mg at 07/27/19 8416   And  . irbesartan (AVAPRO) tablet 300 mg  300 mg Oral Daily Marton Redwood III, MD   300 mg at 07/27/19 6063  . chlorthalidone (HYGROTON) tablet 25 mg  25 mg Oral Daily Marton Redwood III, MD   25 mg at 07/27/19 0925  . diphenhydrAMINE (BENADRYL) injection 12.5 mg  12.5 mg Intravenous Q6H PRN Marton Redwood III, MD       Or  . diphenhydrAMINE (BENADRYL) 12.5 MG/5ML elixir 12.5 mg  12.5 mg Oral Q6H PRN Marton Redwood III, MD      . docusate sodium (COLACE) capsule 100 mg  100 mg Oral BID Marton Redwood III, MD    100 mg at 07/27/19 0160  . furosemide (LASIX) tablet 40 mg  40 mg Oral Alvie Heidelberg III, MD   40 mg at 07/27/19 0926  . insulin aspart (novoLOG) injection 0-20 Units  0-20 Units Subcutaneous TID WC Marton Redwood III, MD   7 Units at 07/27/19 [phone]  . metoprolol succinate (TOPROL-XL) 24 hr tablet 25 mg  25 mg Oral Daily Marton Redwood III, MD   25 mg at 07/27/19 2355  . morphine 4 MG/ML injection 2-4 mg  2-4 mg Intravenous Q2H PRN Marton Redwood III, MD      . ondansetron (ZOFRAN) injection 4 mg  4 mg Intravenous Q4H PRN Marton Redwood III, MD      . opium-belladonna (B&O SUPPRETTES) 16.2-60 MG suppository 1 suppository  1 suppository Rectal Q6H PRN Marton Redwood III, MD      . oxybutynin (DITROPAN) tablet 5 mg  5 mg Oral Q8H PRN Marton Redwood III, MD      . oxyCODONE (Oxy IR/ROXICODONE) immediate release tablet 5 mg  5 mg Oral Q4H PRN Marton Redwood III, MD      . pravastatin (PRAVACHOL) tablet 40 mg  40 mg Oral q1800 Link Snuffer  D III, MD      . senna (SENOKOT) tablet 8.6 mg  1 tablet Oral BID Marton Redwood III, MD   8.6 mg at 07/27/19 3832  . zolpidem (AMBIEN) tablet 5 mg  5 mg Oral QHS PRN Marton Redwood III, MD         Objective: Vital signs in last 24 hours: Temp:  [97.4 F (36.3 C)-98.4 F (36.9 C)] 97.7 F (36.5 C) (01/05 0839) Pulse Rate:  [66-98] 88 (01/05 0839) Resp:  [14-22] 18 (01/05 0839) BP: (102-144)/(53-83) 128/56 (01/05 0839) SpO2:  [88 %-100 %] 97 % (01/05 0839) Weight:  [150.1 kg] 150.1 kg (01/04 1514)  Intake/Output from previous day: 01/04 0701 - 01/05 0700 In: 1505 [P.O.:180; I.V.:1325] Out: 9191 [Urine:1150; Blood:2] Intake/Output this shift: Total I/O In: 60 [P.O.:60] Out: 200 [Urine:200]  Physical Exam Vitals and nursing note reviewed.  Constitutional:      General: He is not in acute distress.    Appearance: He is not ill-appearing, toxic-appearing or diaphoretic.  HENT:     Head: Normocephalic and atraumatic.  Pulmonary:     Effort:  Pulmonary effort is normal. No respiratory distress.  Skin:    General: Skin is warm and dry.  Neurological:     Mental Status: He is oriented to person, place, and time and easily aroused.  Psychiatric:        Mood and Affect: Mood normal.        Behavior: Behavior normal.    Lab Results:  Recent Labs    07/26/19 1516  WBC 13.8*  HGB 12.4*  HCT 38.7*  PLT 326   BMET Recent Labs    07/26/19 1516 07/27/19 0604  NA 138 139  K 3.8 3.5  CL 102 104  CO2 25 24  GLUCOSE 137* 216*  BUN 84* 73*  CREATININE 2.84* 2.55*  CALCIUM 9.6 8.6*   Assessment & Plan: 79 year old male s/p urgent right ureteral stent placement for management of AKI in the setting of a 10 mm right UPJ stone.  Admission UA with possible infection, urine culture pending.  Creatinine improving following renal decompression.  We will plan to discharge today with plans for outpatient ureteroscopy with laser lithotripsy and stent exchange in 1 to 2 weeks.  We will send him with oxybutynin for management of stent discomfort as well as empiric Bactrim for management of possible UTI.  We will continue to follow cultures and adjust therapy as indicated.  Debroah Loop, PA-C 07/27/2019

## 2019-07-27 NOTE — OR Nursing (Signed)
Patient's wife called for update.  Reported that he did well during the night and that he was eating breakfast now.  We will call her when we know that he is going home.

## 2019-07-27 NOTE — Discharge Summary (Signed)
Date of admission: 07/26/2019  Date of discharge: 07/27/2019  Admission diagnosis: Right UPJ calculus, acute renal insufficiency  Discharge diagnosis: Same as above  Secondary diagnoses:  Patient Active Problem List   Diagnosis Date Noted  . Anemia in chronic kidney disease 07/26/2019  . Chronic kidney disease, stage III (moderate) 07/26/2019  . Edema of lower extremity 07/26/2019  . Malignant essential hypertension 07/26/2019  . Secondary hyperparathyroidism of renal origin ([place]) 07/26/2019  . Ureteral calculus 07/26/2019  . Old complex tear of medial meniscus of right knee 10/07/2018  . Primary osteoarthritis of right knee 10/07/2018  . Fatty liver 04/16/2018  . Aortic atherosclerosis ([place]) 04/16/2018  . Bilateral leg edema 03/17/2018  . Morbid obesity due to excess calories ([place]) 11/11/2014  . Microalbuminuria 11/11/2014  . Long-term insulin use ([place]) 11/11/2014  . Perennial allergic rhinitis 09/12/2014  . Type 2 diabetes mellitus with renal manifestations ([place]) 03/19/2014  . Sleep apnea 03/19/2014  . Hypertension 03/19/2014  . Hyperlipemia 03/19/2014    History and Physical: For full details, please see admission history and physical. Briefly, James Clarke is a 79 y.o. year old patient admitted on 07/26/2019 for a 10 mm right UPJ stone with acute renal insufficiency.  He underwent urgent right ureteral stent placement with Dr. Gloriann Loan on 07/26/2019 for management of this.   Hospital Course: Patient tolerated the procedure well.  He remained in the PACU overnight due to inpatient bed shortage.  His hospital course was uncomplicated.  On POD#1 he had met discharge criteria: was eating a regular diet, was up and ambulating independently,  pain was well controlled, was voiding without a catheter, and was ready for discharge.  Laboratory values:  Recent Labs    07/26/19 1516  WBC 13.8*  HGB 12.4*  HCT 38.7*   Recent Labs    07/26/19 1516 07/27/19 0604  NA 138 139  K 3.8 3.5   CL 102 104  CO2 25 24  GLUCOSE 137* 216*  BUN 84* 73*  CREATININE 2.84* 2.55*  CALCIUM 9.6 8.6*   Results for orders placed or performed during the hospital encounter of 07/26/19  Respiratory Panel by RT PCR (Flu A&B, Covid) - Nasopharyngeal Swab     Status: None   Collection Time: 07/26/19  4:24 PM   Specimen: Nasopharyngeal Swab  Result Value Ref Range Status   SARS Coronavirus 2 by RT PCR NEGATIVE NEGATIVE Final    Comment: (NOTE) SARS-CoV-2 target nucleic acids are NOT DETECTED. The SARS-CoV-2 RNA is generally detectable in upper respiratoy specimens during the acute phase of infection. The lowest concentration of SARS-CoV-2 viral copies this assay can detect is 131 copies/mL. A negative result does not preclude SARS-Cov-2 infection and should not be used as the sole basis for treatment or other patient management decisions. A negative result may occur with  improper specimen collection/handling, submission of specimen other than nasopharyngeal swab, presence of viral mutation(s) within the areas targeted by this assay, and inadequate number of viral copies (<131 copies/mL). A negative result must be combined with clinical observations, patient history, and epidemiological information. The expected result is Negative. Fact Sheet for Patients:  PinkCheek.be Fact Sheet for Healthcare Providers:  GravelBags.it This test is not yet ap proved or cleared by the [place] FDA and  has been authorized for detection and/or diagnosis of SARS-CoV-2 by FDA under an Emergency Use Authorization (EUA). This EUA will remain  in effect (meaning this test can be used) for the duration of the COVID-19 declaration under  Section 564(b)(1) of the Act, 21 U.S.C. section 360bbb-3(b)(1), unless the authorization is terminated or revoked sooner.    Influenza A by PCR NEGATIVE NEGATIVE Final   Influenza B by PCR NEGATIVE NEGATIVE Final     Comment: (NOTE) The Xpert Xpress SARS-CoV-2/FLU/RSV assay is intended as an aid in  the diagnosis of influenza from Nasopharyngeal swab specimens and  should not be used as a sole basis for treatment. Nasal washings and  aspirates are unacceptable for Xpert Xpress SARS-CoV-2/FLU/RSV  testing. Fact Sheet for Patients: PinkCheek.be Fact Sheet for Healthcare Providers: GravelBags.it This test is not yet approved or cleared by the [place] FDA and  has been authorized for detection and/or diagnosis of SARS-CoV-2 by  FDA under an Emergency Use Authorization (EUA). This EUA will remain  in effect (meaning this test can be used) for the duration of the  Covid-19 declaration under Section 564(b)(1) of the Act, 21  U.S.C. section 360bbb-3(b)(1), unless the authorization is  terminated or revoked. Performed at [facility], [address] [place], [place] [address]    Disposition: Home  Discharge instruction: The patient was instructed to be ambulatory but told to refrain from heavy lifting, strenuous activity, or driving.   Discharge medications:  Allergies as of 07/27/2019      Reactions   Codeine Nausea And Vomiting   Doxycycline    Erythromycin Rash      Medication List    TAKE these medications   albuterol 108 (90 Base) MCG/ACT inhaler Commonly known as: VENTOLIN HFA Inhale 2 puffs into the lungs every 6 (six) hours as needed for wheezing or shortness of breath.   amLODipine-olmesartan 5-40 MG tablet Commonly known as: AZOR Take 1 tablet by mouth daily.   calcium carbonate 1250 (500 Ca) MG chewable tablet Commonly known as: OS-CAL Chew 1 tablet by mouth daily.   chlorthalidone 25 MG tablet Commonly known as: HYGROTON Take 25 mg by mouth daily.   fluticasone 50 MCG/ACT nasal spray Commonly known as: FLONASE Place 2 sprays daily as needed into both nostrils for allergies. For allergies.    furosemide 40 MG tablet Commonly known as: LASIX Take 40 mg by mouth every other day.   insulin aspart 100 UNIT/ML injection Commonly known as: novoLOG Inject 30-80 Units See admin instructions into the skin. 45 units with breakfast, 30 units with lunch, and 80 units with supper   insulin detemir 100 UNIT/ML injection Commonly known as: LEVEMIR Inject 90 Units into the skin at bedtime.   lovastatin 40 MG tablet Commonly known as: MEVACOR Take 40 mg by mouth daily with supper.   metoprolol succinate 50 MG 24 hr tablet Commonly known as: TOPROL-XL Take 25 mg by mouth daily.   naproxen 375 MG tablet Commonly known as: NAPROSYN Take 1 tablet (375 mg total) by mouth 2 (two) times daily.   oxybutynin 5 MG tablet Commonly known as: DITROPAN Take 1 tablet (5 mg total) by mouth every 8 (eight) hours as needed for bladder spasms.   potassium chloride SA 20 MEQ tablet Commonly known as: KLOR-CON Take 20 mEq by mouth daily.   sulfamethoxazole-trimethoprim 800-160 MG tablet Commonly known as: BACTRIM DS Take 1 tablet by mouth 2 (two) times daily for 7 days.      Followup:   We will schedule him for outpatient ureteroscopy with laser lithotripsy and stent exchange in 1 to 2 weeks. Our surgical scheduler will call him with details.

## 2019-07-28 ENCOUNTER — Telehealth: Payer: Self-pay | Admitting: Family Medicine

## 2019-07-28 LAB — URINE CULTURE: Culture: 30000 — AB

## 2019-07-28 MED ORDER — NITROFURANTOIN MONOHYD MACRO 100 MG PO CAPS: 100.0000 mg | ORAL_CAPSULE | Freq: Two times a day (BID) | ORAL | 0 refills | Status: DC

## 2019-07-28 NOTE — Telephone Encounter (Signed)
Patient notified and voiced understandidng.

## 2019-07-28 NOTE — Telephone Encounter (Signed)
-----   Message from Nori Riis, PA-C sent at 07/28/2019  8:28 AM EST ----- Would you call Mr. Chunn and tell him we need to switch his antibiotic from the Septra DS to Macrobid 100 mg BID x 14 days.

## 2019-07-29 ENCOUNTER — Other Ambulatory Visit: Payer: Self-pay | Admitting: Physician Assistant

## 2019-07-29 MED ORDER — AMPICILLIN 500 MG PO CAPS: 500.0000 mg | ORAL_CAPSULE | Freq: Four times a day (QID) | ORAL | 0 refills | Status: AC

## 2019-07-29 NOTE — Progress Notes (Signed)
Per Dr. Diamantina Providence, switching patient from [facility] to ampicillin 4 times daily for 2 weeks for culture appropriate therapy of urinary infection in advance of planned surgery.  Contacted patient to inform him to discontinue his other antibiotics and switch to ampicillin as soon as possible.  He expressed understanding.

## 2019-08-02 ENCOUNTER — Other Ambulatory Visit: Payer: Self-pay | Admitting: Urology

## 2019-08-04 NOTE — Anesthesia Postprocedure Evaluation (Signed)
Anesthesia Post Note  Patient: James Clarke  Procedure(s) Performed: CYSTOSCOPY WITH STENT PLACEMENT (Right )  Patient location during evaluation: PACU Anesthesia Type: General Level of consciousness: awake and alert Pain management: pain level controlled Vital Signs Assessment: post-procedure vital signs reviewed and stable Respiratory status: spontaneous breathing, nonlabored ventilation, respiratory function stable and patient connected to nasal cannula oxygen Cardiovascular status: blood pressure returned to baseline and stable Postop Assessment: no apparent nausea or vomiting Anesthetic complications: no     Last Vitals:  Vitals:   07/27/19 1052 07/27/19 1232  BP: (!) 128/56 131/61  Pulse: 86 89  Resp: 20 18  Temp:  36.7 C  SpO2: 97% 98%    Last Pain:  Vitals:   07/27/19 1232  TempSrc:   PainSc: 0-No pain                 Molli Barrows

## 2019-08-05 DIAGNOSIS — N179 Acute kidney failure, unspecified: Secondary | ICD-10-CM | POA: Diagnosis not present

## 2019-08-05 DIAGNOSIS — I10 Essential (primary) hypertension: Secondary | ICD-10-CM | POA: Diagnosis not present

## 2019-08-05 DIAGNOSIS — N200 Calculus of kidney: Secondary | ICD-10-CM | POA: Diagnosis not present

## 2019-08-10 ENCOUNTER — Encounter
Admission: RE | Admit: 2019-08-10 | Discharge: 2019-08-10 | Disposition: A | Discharge status: Home / Self Care | Payer: PPO | Source: Ambulatory Visit | Attending: Urology | Admitting: Urology

## 2019-08-10 NOTE — Pre-Procedure Instructions (Signed)
ECG 12-lead9/23/2020 [place] Component Name Value Ref Range  Vent Rate (bpm) 73   QRS Interval (msec) 96   QT Interval (msec) 380   QTc (msec) 418   Other Result Information  This result has an attachment that is not available.  Result Narrative  Atrial fibrillation with occasional premature ventricular complexes Nonspecific T wave abnormalities Abnormal ECG When compared with ECG of 17-Mar-2018 14:26, Previous ECG has undetermined rhythm, needs review I reviewed and concur with this report. Electronically signed [id number] MD, Darnell Level [phone]) on 04/19/2019 1:51:21 PM  Status Results Details   Encounter Summary

## 2019-08-10 NOTE — Patient Instructions (Signed)
Your procedure is scheduled on: 08-13-19 FRIDAY Report to Same Day Surgery 2nd floor medical mall [facility] Entrance-take elevator on left to 2nd floor.  Check in with surgery information desk.) To find out your arrival time please call [phone] between 1PM - 3PM on 08-11-19 THURSDAY  Remember: Instructions that are not followed completely may result in serious medical risk, up to and including death, or upon the discretion of your surgeon and anesthesiologist your surgery may need to be rescheduled.    _x___ 1. Do not eat food after midnight the night before your procedure. NO GUM OR CANDY AFTER MIDNIGHT. You may drink WATER up to 2 hours before you are scheduled to arrive at the hospital for your procedure.  Do not drink WATER within 2 hours of your scheduled arrival to the hospital.  Type 1 and type 2 diabetics should only drink water.   ____Ensure clear carbohydrate drink on the way to the hospital for bariatric patients  ____Ensure clear carbohydrate drink 3 hours before surgery.    __x__ 2. No Alcohol for 24 hours before or after surgery.   __x__3. No Smoking or e-cigarettes for 24 prior to surgery.  Do not use any chewable tobacco products for at least 6 hour prior to surgery   ____  4. Bring all medications with you on the day of surgery if instructed.    __x__ 5. Notify your doctor if there is any change in your medical condition     (cold, fever, infections).    x___6. On the morning of surgery brush your teeth with toothpaste and water.  You may rinse your mouth with mouth wash if you wish.  Do not swallow any toothpaste or mouthwash.   Do not wear jewelry, make-up, hairpins, clips or nail polish.  Do not wear lotions, powders, or perfumes. You may wear deodorant.  Do not shave 48 hours prior to surgery. Men may shave face and neck.  Do not bring valuables to the hospital.    [facility] is not responsible for any belongings or valuables.               Contacts,  dentures or bridgework may not be worn into surgery.  Leave your suitcase in the car. After surgery it may be brought to your room.  For patients admitted to the hospital, discharge time is determined by your treatment team.  _  Patients discharged the day of surgery will not be allowed to drive home.  You will need someone to drive you home and stay with you the night of your procedure.    Please read over the following fact sheets that you were given:   [facility] Preparing for Surgery   _x___ TAKE THE FOLLOWING MEDICATION THE MORNING OF SURGERY WITH A SMALL SIP OF WATER. These include:  1. METOPROLOL  2.  3.  4.  5.  6.  ____Fleets enema or Magnesium Citrate as directed.   ____ Use CHG Soap or sage wipes as directed on instruction sheet   _X___ Use inhalers on the day of surgery and bring to hospital day of surgery-USE ALBUTEROL INHALER AM OF SURGERY AND [place]  ____ Stop Metformin and Janumet 2 days prior to surgery.    _X___ Take 1/2 of usual insulin dose the night before surgery and none on the morning surgery-TAKE HALF OF YOUR LEVEMIR Thursday NIGHT (50 UNITS) AND NO INSULIN AM OF SURGERY  ____ Follow recommendations from Cardiologist, Pulmonologist or  PCP regarding stopping Aspirin, Coumadin, Plavix ,Eliquis, Effient, or Pradaxa, and Pletal.  X____Stop Anti-inflammatories such as Advil, Aleve, Ibuprofen, Motrin, Naproxen, Naprosyn, Goodies powders or aspirin products. OK to take Tylenol    ____ Stop supplements until after surgery.    _X___ Bring C-Pap to the hospital.

## 2019-08-10 NOTE — Pre-Procedure Instructions (Signed)
Progress Notes - documented in this encounter Mittie Bodo, [place] - 04/14/2019 3:30 PM EDT Formatting of this note might be different from the original. Established Patient Visit   Chief Complaint: Chief Complaint  Patient presents with  . Hypertension  . Hyperlipidemia  Date of Service: 04/14/2019 Date of Birth: 1941/05/12 PCP: Loa Socks., MD   History of Present Illness: Mr. Lazar is a 79 y.o. male with a history of HTN, HLD, obstructive sleep apnea, on BiPAP, and abdominal aortic atherosclerosis seen on imaging, who presents for follow up feeling generally well from a cardiac standpoint despite ongoing dyspnea with exertion which has been present for several years. He states that he is not doing much of any activity due to his shortness of breath. He becomes winded when walking farther than across one room. He thinks that his shortness of breath may be mildly worsened in the last six months but he denies any chest pain. He also endorsed mild leg swelling, worse at the end of the day, and improved after elevating of his legs. He is currently taking lasix 40 mg every other day for this leg swelling. No recent change to his lasix. Denies any lightheadedness, syncope, palpitations or leg swelling.   ECHO from 11/2016 with EF of 50-55% with mild mitral regurgitation but otherwise normal study. NM stress test from the same time showed no evidence of ischemia.   Past Medical and Surgical History  Past Medical History Past Medical History:  Diagnosis Date  . Allergic state  . Aortic atherosclerosis (CMS-HCC) 04/16/2018  . Bell's palsy  . Cataract cortical, senile  Bilat. Being followed by [facility]. Still pending riping for surgery.  . CHF (congestive heart failure) (CMS-HCC)  . Diabetic nephropathy (CMS-HCC)  . Fatty liver on CT 03/2018 04/16/2018  . History of nephrolithiasis  . Hyperlipemia  . Hypertension  . Low testosterone  . Renal insufficiency  . Sleep  apnea  BiPaP  . Type 2 diabetes mellitus (CMS-HCC)  . Type II or unspecified type diabetes mellitus without mention of complication, not stated as uncontrolled (CMS-HCC)  last eye exam 03/2012   Past Surgical History He has a past surgical history that includes Cholecystectomy (2007); Inguinal hernia repair; Appendectomy (1960); wrist surgery (2003); kidney stones (2006); Tonsillectomy (1946); Excision Ganglion Cyst Wrist Primary (05/02/11); Cataract extraction (Bilateral); Colonoscopy (01/08/2010); and Colonoscopy (03/29/2013).   Medications and Allergies  Current Medications  Current Outpatient Medications  Medication Sig Dispense Refill  . albuterol 90 mcg/actuation inhaler Inhale 2 inhalations into the lungs every 6 (six) hours as needed for Wheezing.  Marland Kitchen amLODIPine-olmesartan (AZOR) 5-40 mg tablet Take 1 tablet by mouth once daily 90 tablet 2  . calcitRIOL (ROCALTROL) 0.25 MCG capsule Take 0.25 mcg by mouth once daily  . cetirizine (ZYRTEC) 10 MG tablet Take 10 mg by mouth as needed for Allergies  . chlorthalidone 25 MG tablet TAKE (1) TABLET BY MOUTH EVERY DAY 90 tablet 1  . fluticasone propionate (FLONASE) 50 mcg/actuation nasal spray 2 SPRAYS IN EACH NOSTRIL ONCE A DAY 16 g 3  . FUROsemide (LASIX) 40 MG tablet TAKE ONE TABLET BY MOUTH EVERY OTHER DAYAS NEEDED 15 tablet 2  . insulin ASPART (NOVOLOG U-100 INSULIN ASPART) injection (concentration 100 units/mL) Inject 50 units before breakfast, 36 units before lunch and 80 units before supper. 190 mL 3  . insulin DETEMIR (LEVEMIR U-100 INSULIN) injection (concentration 100 units/mL) Inject 100 Units subcutaneously nightly 90 mL 3  . insulin syringe-needle U-100 (BD  INSULIN SYRINGE ULTRA-FINE) 0.5 mL 31 gauge x 5/16 syringe 4 (four) times daily. 400 each 1  . insulin syringe-needle U-100 0.5 mL 31 gauge x 5/16" syringe USE 4 TIMES DAILY 400 each 0  . insulin syringe-needle U-100 1 mL 31 gauge x 5/16 syringe TEST BLOOD SUGAR 4 TIMES DAILY  400 each 3  . lovastatin (MEVACOR) 40 MG tablet TAKE ONE TABLET AT BEDTIME. 9 tablet 3  . metoprolol succinate (TOPROL-XL) 50 MG XL tablet Take 0.5 tablets (25 mg total) by mouth once daily 30 tablet 11  . ONETOUCH DELICA PLUS LANCET 33 gauge Misc USE 1 LANCET 4 TIMES A DAY 120 each 11  . ONETOUCH ULTRA BLUE TEST STRIP test strip TEST BLOOD SUGAR 4 TIMES DAILY 400 each 2  . potassium chloride (KLOR-CON) 20 MEQ ER tablet TAKE (1) TABLET BY MOUTH EVERY DAY 90 tablet 3   No current facility-administered medications for this visit.   Allergies: Doxycycline, Codeine, and Erythromycin  Social and Family History  Social History reports that he has never smoked. He has never used smokeless tobacco. He reports that he does not drink alcohol or use drugs.  Family History Family History  Problem Relation Age of Onset  . Brain cancer Father  . High blood pressure (Hypertension) Mother  . Diabetes type II Mother  . Stroke Maternal Grandmother  . Stroke Maternal Grandfather  . No Known Problems Paternal Grandmother  . No Known Problems Paternal Grandfather  . No Known Problems Daughter  . No Known Problems Son   Review of Systems   Review of Systems: shortness of breath  The patient denies chest pain, orthopnea, paroxysmal nocturnal dyspnea, pedal edema, palpitations, heart racing, dizziness, lightheadedness, presyncope, syncope, leg pain, leg cramping.  Review of 12 Systems is negative except as described above.  Physical Examination   Vitals:BP 130/80 (BP Location: Left upper arm, Patient Position: Sitting, BP Cuff Size: Large Adult)  Pulse 73  Resp 16  Ht 177.8 cm (5\' 10" )  Wt (!) 149.7 kg (330 lb)  SpO2 98%  BMI 47.35 kg/m  Ht:177.8 cm (5\' 10" ) Wt:(!) 149.7 kg (330 lb) EUM:PNTI surface area is 2.72 meters squared. Body mass index is 47.35 kg/m.  Constitutional: Morbidly Obese. Alert, well developed, in no acute distress HEENT: Pupils equally reactive to light and accomodation   Neck: Supple. Carotid pulse 2+ bilaterally Lungs: Distant breath sounds secondary to habitus but no appreciable wheezes, rales, rhonchi Heart: Regular rate and rhythm. No gallops, murmurs or rub Extremities: 2+ pitting edema to the mid shins bilaterally. No cyanosis.  Peripheral Pulses: 2+ in upper extremities, 2+ in lower extremities   Assessment   79 y.o. male with  1. Essential hypertension  2. Aortic atherosclerosis (CMS-HCC)  3. Obstructive sleep apnea syndrome  4. Pure hypercholesterolemia   EKG as read by me: Significant artifact present - regular rhythm, Rate of 73 BPM, normal axis, remainder of interpretation difficulty due to artifact  Plan   1. Dyspnea:  Present and stable for years without any significant change. Suspect shortness of breath is multifactorial related to his minimal activity level, obesity, and deconditioning. ECHO from 2018 with normal LV function and mild mitral valve regurgitation. Discussed plan to begin gradually increasing exercise program as tolerated to improve strength. Have also recommended weight loss. Given no significant change in symptoms, chest pain, or other concerning symptoms, will forgo further testing at this time. Consider repeat ECHO as needed for change in symptoms.   2. HTN Currently on  amlodipine 5 mg daily, olmesartan 40 mg daily, chlorthalidone 25 mg daily, lasix 40 mg daily, and metoprolol 25 mg daily for blood pressure control. Blood pressure is well controlled today at 130/80. Continue current regimen.   3. HLD: Currently on moderate intensity statin therapy with lovastatin 40 mg daily. Last lipid panel from 10/2018 reviewed - total cholesterol 108, HDL 32, LDL 62. Tolerating statin therapy well. Continue current regimen.   4. Obstructive Sleep apnea: Patient on nightly bipap use for 8-9 hours per night. Encouraged continued CPAP use. This is followed by Dr. Virgia Land.   5. Aortic atherosclerosis:  Currently on statin therapy with  lovastatin. Continue statin therapy.   Orders Placed This Encounter  Procedures  . ECG 12-lead   Return in about 6 months (around 10/12/2019).  MICHELLE Jimmey Ralph, PA-C    Electronically signed by Mittie Bodo, [place] at 04/15/2019 7:39 PM EDT

## 2019-08-11 ENCOUNTER — Other Ambulatory Visit
Admission: RE | Admit: 2019-08-11 | Discharge: 2019-08-11 | Disposition: A | Discharge status: Home / Self Care | Payer: PPO | Source: Ambulatory Visit | Attending: Urology | Admitting: Urology

## 2019-08-11 ENCOUNTER — Encounter: Payer: Self-pay | Admitting: *Deleted

## 2019-08-11 ENCOUNTER — Other Ambulatory Visit: Payer: Self-pay

## 2019-08-11 DIAGNOSIS — Z20822 Contact with and (suspected) exposure to covid-19: Secondary | ICD-10-CM | POA: Insufficient documentation

## 2019-08-11 DIAGNOSIS — Z01812 Encounter for preprocedural laboratory examination: Secondary | ICD-10-CM | POA: Diagnosis not present

## 2019-08-11 LAB — SARS CORONAVIRUS 2 (TAT 6-24 HRS): SARS Coronavirus 2: NEGATIVE

## 2019-08-13 ENCOUNTER — Ambulatory Visit
Admission: RE | Admit: 2019-08-13 | Discharge: 2019-08-13 | Disposition: A | Discharge status: Home / Self Care | Payer: PPO | Attending: Urology | Admitting: Urology

## 2019-08-13 ENCOUNTER — Telehealth: Payer: Self-pay | Admitting: Urology

## 2019-08-13 ENCOUNTER — Encounter: Payer: Self-pay | Admitting: Urology

## 2019-08-13 ENCOUNTER — Ambulatory Visit: Payer: PPO

## 2019-08-13 ENCOUNTER — Encounter
Admission: RE | Disposition: A | Discharge status: Home / Self Care | Payer: Self-pay | Source: Home / Self Care | Attending: Urology

## 2019-08-13 ENCOUNTER — Other Ambulatory Visit: Payer: Self-pay

## 2019-08-13 DIAGNOSIS — Z881 Allergy status to other antibiotic agents status: Secondary | ICD-10-CM | POA: Diagnosis not present

## 2019-08-13 DIAGNOSIS — N183 Chronic kidney disease, stage 3 unspecified: Secondary | ICD-10-CM | POA: Insufficient documentation

## 2019-08-13 DIAGNOSIS — N200 Calculus of kidney: Secondary | ICD-10-CM

## 2019-08-13 DIAGNOSIS — Z85828 Personal history of other malignant neoplasm of skin: Secondary | ICD-10-CM | POA: Diagnosis not present

## 2019-08-13 DIAGNOSIS — G4730 Sleep apnea, unspecified: Secondary | ICD-10-CM | POA: Insufficient documentation

## 2019-08-13 DIAGNOSIS — Z6841 Body Mass Index (BMI) 40.0 and over, adult: Secondary | ICD-10-CM | POA: Diagnosis not present

## 2019-08-13 DIAGNOSIS — I129 Hypertensive chronic kidney disease with stage 1 through stage 4 chronic kidney disease, or unspecified chronic kidney disease: Secondary | ICD-10-CM | POA: Diagnosis not present

## 2019-08-13 DIAGNOSIS — E1140 Type 2 diabetes mellitus with diabetic neuropathy, unspecified: Secondary | ICD-10-CM | POA: Insufficient documentation

## 2019-08-13 DIAGNOSIS — Z885 Allergy status to narcotic agent status: Secondary | ICD-10-CM | POA: Insufficient documentation

## 2019-08-13 DIAGNOSIS — E785 Hyperlipidemia, unspecified: Secondary | ICD-10-CM | POA: Diagnosis not present

## 2019-08-13 DIAGNOSIS — D631 Anemia in chronic kidney disease: Secondary | ICD-10-CM | POA: Diagnosis not present

## 2019-08-13 DIAGNOSIS — Z87442 Personal history of urinary calculi: Secondary | ICD-10-CM | POA: Diagnosis not present

## 2019-08-13 DIAGNOSIS — N201 Calculus of ureter: Secondary | ICD-10-CM | POA: Diagnosis not present

## 2019-08-13 DIAGNOSIS — E1122 Type 2 diabetes mellitus with diabetic chronic kidney disease: Secondary | ICD-10-CM | POA: Insufficient documentation

## 2019-08-13 DIAGNOSIS — Z466 Encounter for fitting and adjustment of urinary device: Secondary | ICD-10-CM | POA: Diagnosis not present

## 2019-08-13 HISTORY — PX: CYSTOSCOPY/URETEROSCOPY/HOLMIUM LASER/STENT PLACEMENT: SHX6546

## 2019-08-13 HISTORY — PX: CYSTOSCOPY W/ RETROGRADES: SHX1426

## 2019-08-13 LAB — GLUCOSE, CAPILLARY
Glucose-Capillary: 205 mg/dL — ABNORMAL HIGH (ref 70–99)
Glucose-Capillary: 200 mg/dL — ABNORMAL HIGH (ref 70–99)

## 2019-08-13 SURGERY — CYSTOSCOPY/URETEROSCOPY/HOLMIUM LASER/STENT PLACEMENT
Anesthesia: General | Site: Ureter | Laterality: Right

## 2019-08-13 MED ORDER — SUGAMMADEX SODIUM 500 MG/5ML IV SOLN
INTRAVENOUS | Status: DC | PRN
  Administered 2019-08-13: 400 mg via INTRAVENOUS
  Administered 2019-08-13: 200 mg via INTRAVENOUS

## 2019-08-13 MED ORDER — SODIUM CHLORIDE 0.9 % IV SOLN
1.0000 g | Freq: Once | INTRAVENOUS | Status: AC
  Administered 2019-08-13: 1 g via INTRAVENOUS
  Filled 2019-08-13: qty 1000

## 2019-08-13 MED ORDER — TAMSULOSIN HCL 0.4 MG PO CAPS: 0.4000 mg | ORAL_CAPSULE | Freq: Every day | ORAL | 0 refills | Status: DC

## 2019-08-13 MED ORDER — FENTANYL CITRATE (PF) 100 MCG/2ML IJ SOLN: 25.0000 ug | INTRAMUSCULAR | Status: DC | PRN

## 2019-08-13 MED ORDER — GLYCOPYRROLATE 0.2 MG/ML IJ SOLN
INTRAMUSCULAR | Status: DC | PRN
  Administered 2019-08-13: .2 mg via INTRAVENOUS

## 2019-08-13 MED ORDER — HYDROCODONE-ACETAMINOPHEN 5-325 MG PO TABS: 1.0000 | ORAL_TABLET | ORAL | 0 refills | Status: AC | PRN

## 2019-08-13 MED ORDER — ONDANSETRON HCL 4 MG/2ML IJ SOLN
INTRAMUSCULAR | Status: AC
  Filled 2019-08-13: qty 2

## 2019-08-13 MED ORDER — EPHEDRINE SULFATE 50 MG/ML IJ SOLN
INTRAMUSCULAR | Status: AC
  Filled 2019-08-13: qty 1

## 2019-08-13 MED ORDER — PHENYLEPHRINE HCL (PRESSORS) 10 MG/ML IV SOLN
INTRAVENOUS | Status: DC | PRN
  Administered 2019-08-13: 150 ug via INTRAVENOUS
  Administered 2019-08-13 (×2): 100 ug via INTRAVENOUS
  Administered 2019-08-13: 200 ug via INTRAVENOUS
  Administered 2019-08-13: 100 ug via INTRAVENOUS
  Administered 2019-08-13: 150 ug via INTRAVENOUS

## 2019-08-13 MED ORDER — PROPOFOL 10 MG/ML IV BOLUS
INTRAVENOUS | Status: AC
  Filled 2019-08-13: qty 20

## 2019-08-13 MED ORDER — DEXAMETHASONE SODIUM PHOSPHATE 10 MG/ML IJ SOLN
INTRAMUSCULAR | Status: DC | PRN
  Administered 2019-08-13: 10 mg via INTRAVENOUS

## 2019-08-13 MED ORDER — BELLADONNA ALKALOIDS-OPIUM 16.2-60 MG RE SUPP
RECTAL | Status: AC
  Filled 2019-08-13: qty 1

## 2019-08-13 MED ORDER — ONDANSETRON HCL 4 MG/2ML IJ SOLN
INTRAMUSCULAR | Status: DC | PRN
  Administered 2019-08-13: 4 mg via INTRAVENOUS

## 2019-08-13 MED ORDER — BELLADONNA ALKALOIDS-OPIUM 16.2-60 MG RE SUPP
RECTAL | Status: DC | PRN
  Administered 2019-08-13: 1 via RECTAL

## 2019-08-13 MED ORDER — SODIUM CHLORIDE 0.9 % IV SOLN: INTRAVENOUS | Status: DC

## 2019-08-13 MED ORDER — FAMOTIDINE 20 MG PO TABS
20.0000 mg | ORAL_TABLET | Freq: Once | ORAL | Status: AC
  Administered 2019-08-13: 09:00:00 20 mg via ORAL

## 2019-08-13 MED ORDER — ROCURONIUM BROMIDE 100 MG/10ML IV SOLN
INTRAVENOUS | Status: DC | PRN
  Administered 2019-08-13: 25 mg via INTRAVENOUS
  Administered 2019-08-13 (×3): 5 mg via INTRAVENOUS

## 2019-08-13 MED ORDER — DEXAMETHASONE SODIUM PHOSPHATE 10 MG/ML IJ SOLN
INTRAMUSCULAR | Status: AC
  Filled 2019-08-13: qty 1

## 2019-08-13 MED ORDER — LIDOCAINE HCL (CARDIAC) PF 100 MG/5ML IV SOSY
PREFILLED_SYRINGE | INTRAVENOUS | Status: DC | PRN
  Administered 2019-08-13: 100 mg via INTRAVENOUS

## 2019-08-13 MED ORDER — LIDOCAINE HCL (PF) 2 % IJ SOLN
INTRAMUSCULAR | Status: AC
  Filled 2019-08-13: qty 5

## 2019-08-13 MED ORDER — FAMOTIDINE 20 MG PO TABS
ORAL_TABLET | ORAL | Status: AC
  Filled 2019-08-13: qty 1

## 2019-08-13 MED ORDER — IOHEXOL 180 MG/ML  SOLN
INTRAMUSCULAR | Status: DC | PRN
  Administered 2019-08-13: 20 mL

## 2019-08-13 MED ORDER — FENTANYL CITRATE (PF) 100 MCG/2ML IJ SOLN
INTRAMUSCULAR | Status: AC
  Filled 2019-08-13: qty 2

## 2019-08-13 MED ORDER — EPHEDRINE SULFATE 50 MG/ML IJ SOLN
INTRAMUSCULAR | Status: DC | PRN
  Administered 2019-08-13 (×2): 10 mg via INTRAVENOUS

## 2019-08-13 MED ORDER — FENTANYL CITRATE (PF) 100 MCG/2ML IJ SOLN
INTRAMUSCULAR | Status: DC | PRN
  Administered 2019-08-13: 50 ug via INTRAVENOUS

## 2019-08-13 MED ORDER — AMPICILLIN 500 MG PO CAPS: 500.0000 mg | ORAL_CAPSULE | Freq: Every day | ORAL | 0 refills | Status: DC

## 2019-08-13 MED ORDER — PROPOFOL 10 MG/ML IV BOLUS
INTRAVENOUS | Status: DC | PRN
  Administered 2019-08-13: 120 mg via INTRAVENOUS

## 2019-08-13 MED ORDER — SUCCINYLCHOLINE CHLORIDE 20 MG/ML IJ SOLN
INTRAMUSCULAR | Status: DC | PRN
  Administered 2019-08-13: 140 mg via INTRAVENOUS

## 2019-08-13 MED ORDER — LACTATED RINGERS IV SOLN: INTRAVENOUS | Status: DC

## 2019-08-13 MED ORDER — ROCURONIUM BROMIDE 50 MG/5ML IV SOLN
INTRAVENOUS | Status: AC
  Filled 2019-08-13: qty 1

## 2019-08-13 SURGICAL SUPPLY — 30 items
BAG DRAIN CYSTO-URO LG1000N (MISCELLANEOUS) ×3 IMPLANT
BRUSH SCRUB EZ 1% IODOPHOR (MISCELLANEOUS) ×3 IMPLANT
CATH URETL 5X70 OPEN END (CATHETERS) ×3 IMPLANT
CNTNR SPEC 2.5X3XGRAD LEK (MISCELLANEOUS)
CONT SPEC 4OZ STER OR WHT (MISCELLANEOUS)
CONTAINER SPEC 2.5X3XGRAD LEK (MISCELLANEOUS) IMPLANT
DRAPE UTILITY 15X26 TOWEL STRL (DRAPES) ×3 IMPLANT
FIBER LASER TRAC TIP (UROLOGICAL SUPPLIES) ×3 IMPLANT
GLOVE BIOGEL PI IND STRL 7.5 (GLOVE) ×1 IMPLANT
GLOVE BIOGEL PI INDICATOR 7.5 (GLOVE) ×2
GOWN STRL REUS W/ TWL LRG LVL3 (GOWN DISPOSABLE) ×1 IMPLANT
GOWN STRL REUS W/ TWL XL LVL3 (GOWN DISPOSABLE) ×1 IMPLANT
GOWN STRL REUS W/TWL LRG LVL3 (GOWN DISPOSABLE) ×2
GOWN STRL REUS W/TWL XL LVL3 (GOWN DISPOSABLE) ×2
GUIDEWIRE STR DUAL SENSOR (WIRE) ×6 IMPLANT
INFUSOR MANOMETER BAG 3000ML (MISCELLANEOUS) ×3 IMPLANT
INTRODUCER DILATOR DOUBLE (INTRODUCER) IMPLANT
KIT TURNOVER CYSTO (KITS) ×3 IMPLANT
PACK CYSTO AR (MISCELLANEOUS) ×3 IMPLANT
SET CYSTO W/LG BORE CLAMP LF (SET/KITS/TRAYS/PACK) ×3 IMPLANT
SHEATH URETERAL 12FRX35CM (MISCELLANEOUS) IMPLANT
SOL .9 NS 3000ML IRR  AL (IV SOLUTION) ×2
SOL .9 NS 3000ML IRR UROMATIC (IV SOLUTION) ×1 IMPLANT
STENT URET 6FRX24 CONTOUR (STENTS) IMPLANT
STENT URET 6FRX26 CONTOUR (STENTS) IMPLANT
STENT URET 6FRX28 CONTOUR (STENTS) ×3 IMPLANT
SURGILUBE 2OZ TUBE FLIPTOP (MISCELLANEOUS) ×3 IMPLANT
SYR 10ML LL (SYRINGE) ×3 IMPLANT
VALVE UROSEAL ADJ ENDO (VALVE) ×3 IMPLANT
WATER STERILE IRR 1000ML POUR (IV SOLUTION) ×3 IMPLANT

## 2019-08-13 NOTE — Transfer of Care (Signed)
Immediate Anesthesia Transfer of Care Note  Patient: James Clarke  Procedure(s) Performed: CYSTOSCOPY/URETEROSCOPY/HOLMIUM LASER/STENT EXCHANGE (Right Ureter) CYSTOSCOPY WITH RETROGRADE PYELOGRAM (Right Ureter)  Patient Location: PACU  Anesthesia Type:General  Level of Consciousness: drowsy and patient cooperative  Airway & Oxygen Therapy: Patient Spontanous Breathing and Patient connected to face mask oxygen  Post-op Assessment: Report given to RN and Post -op Vital signs reviewed and stable  Post vital signs: Reviewed and stable  Last Vitals:  Vitals Value Taken Time  BP 115/60 08/13/19 1118  Temp 36 C 08/13/19 1116  Pulse 77 08/13/19 1119  Resp 20 08/13/19 1119  SpO2 97 % 08/13/19 1119  Vitals shown include unvalidated device data.  Last Pain:  Vitals:   08/13/19 1116  TempSrc:   PainSc: 0-No pain         Complications: No apparent anesthesia complications

## 2019-08-13 NOTE — H&P (Signed)
UROLOGY H&P UPDATE  Agree with prior H&P dated 07/26/2019 by Dr. Gloriann Loan.  79 year old male here for definitive management of his right-sided stone burden with ureteroscopy.  Was previously stented on 1/4 for Enterococcus infection.  Has been on culture appropriate ampicillin for 2 weeks.  Cardiac: RRR Lungs: CTA bilaterally  Laterality: Right Procedure: Right ureteroscopy, laser lithotripsy, stent placement  Urine: Culture 07/26/2019 with 30 K Enterococcus, has been on culture appropriate ampicillin  We specifically discussed the risks ureteroscopy including bleeding, infection/sepsis, stent related symptoms including flank pain/urgency/frequency/incontinence/dysuria, ureteral injury, inability to access stone, or need for staged or additional procedures.   Billey Co, MD 08/13/2019

## 2019-08-13 NOTE — Telephone Encounter (Signed)
Pt's wife called office and wants to know if he should wait to take Lasix again Monday, or if he should take today.  Pt normally takes this Monday, Wednesday and Friday.  [phone]

## 2019-08-13 NOTE — Discharge Instructions (Signed)

## 2019-08-13 NOTE — Anesthesia Preprocedure Evaluation (Addendum)
Anesthesia Evaluation  Patient identified by MRN, date of birth, ID band Patient awake    Reviewed: Allergy & Precautions, H&P , NPO status , Patient's Chart, lab work & pertinent test results  Airway Mallampati: III  TM Distance: >3 FB Neck ROM: full    Dental  (+) Chipped   Pulmonary shortness of breath and with exertion, sleep apnea and Continuous Positive Airway Pressure Ventilation , neg recent URI, Not current smoker,           Cardiovascular hypertension, (-) angina(-) Past MI and (-) Cardiac Stents (-) dysrhythmias      Neuro/Psych Bell's palsy negative psych ROS   GI/Hepatic negative GI ROS, Neg liver ROS,   Endo/Other  diabetes, Type 2Morbid obesity  Renal/GU CRFRenal disease     Musculoskeletal  (+) Arthritis ,   Abdominal   Peds  Hematology  (+) Blood dyscrasia, anemia , Hgb 12.4   Anesthesia Other Findings Obesity  Past Medical History: No date: Bell's palsy     Comment:  SEVERAL TIMES No date: Bell's palsy No date: Cancer ([place])     Comment:  SKIN No date: Diabetes mellitus without complication (HCC) No date: Dyspnea     Comment:  DOE No date: Hyperlipemia No date: Hypertension No date: Neuropathy No date: Obesity No date: Sleep apnea     Comment:  C PAP  Past Surgical History: No date: APPENDECTOMY 05/20/2017: CATARACT EXTRACTION W/PHACO; Left     Comment:  Procedure: CATARACT EXTRACTION PHACO AND INTRAOCULAR               LENS PLACEMENT (IOC);  Surgeon: Birder Robson, MD;                Location: [facility] ORS;  Service: Ophthalmology;  Laterality:              Left;  [place] 00:32.0 AP% 15.5 CDE 4.97 Fluid Pack Lot #               [id number] 06/10/2017: CATARACT EXTRACTION W/PHACO; Right     Comment:  Procedure: CATARACT EXTRACTION PHACO AND INTRAOCULAR               LENS PLACEMENT (IOC);  Surgeon: Birder Robson, MD;                Location: [facility] ORS;  Service: Ophthalmology;   Laterality:              Right;  [place]  00:51 AP% 15.4 CDE 7.95 Fluid pack lot #               [id number] No date: CHOLECYSTECTOMY 07/26/2019: CYSTOSCOPY WITH STENT PLACEMENT; Right     Comment:  Procedure: CYSTOSCOPY WITH STENT PLACEMENT;  Surgeon:               Lucas Mallow, MD;  Location: [facility] ORS;  Service:               Urology;  Laterality: Right; No date: FRACTURE SURGERY     Comment:  WRIST No date: HAND SURGERY No date: HERNIA REPAIR No date: TONSILLECTOMY     Reproductive/Obstetrics negative OB ROS                           Anesthesia Physical Anesthesia Plan  ASA: III  Anesthesia Plan: General ETT   Post-op Pain Management:    Induction:   PONV Risk Score and Plan: Ondansetron, Dexamethasone and Treatment  may vary due to age or medical condition  Airway Management Planned:   Additional Equipment:   Intra-op Plan:   Post-operative Plan:   Informed Consent: I have reviewed the patients History and Physical, chart, labs and discussed the procedure including the risks, benefits and alternatives for the proposed anesthesia with the patient or authorized representative who has indicated his/her understanding and acceptance.     Dental Advisory Given  Plan Discussed with: Anesthesiologist  Anesthesia Plan Comments:         Anesthesia Quick Evaluation

## 2019-08-13 NOTE — Anesthesia Procedure Notes (Signed)
Procedure Name: Intubation Date/Time: 08/13/2019 10:13 AM Performed by: Kelton Pillar, CRNA Pre-anesthesia Checklist: Patient identified, Emergency Drugs available, Suction available and Patient being monitored Patient Re-evaluated:Patient Re-evaluated prior to induction Oxygen Delivery Method: Circle system utilized Preoxygenation: Pre-oxygenation with 100% oxygen Induction Type: IV induction Ventilation: Mask ventilation without difficulty, Oral airway inserted - appropriate to patient size and Two handed mask ventilation required Laryngoscope Size: McGraph and 4 Grade View: Grade I Tube type: Oral Tube size: 7.5 mm Number of attempts: 1 Airway Equipment and Method: Stylet and Oral airway (ramp) Placement Confirmation: ETT inserted through vocal cords under direct vision,  positive ETCO2 and breath sounds checked- equal and bilateral Secured at: 23 cm Tube secured with: Tape Dental Injury: Teeth and Oropharynx as per pre-operative assessment

## 2019-08-13 NOTE — Op Note (Signed)
Date of procedure: 08/13/19  Preoperative diagnosis:  1. Right renal stones, ~1.5cm  Postoperative diagnosis:  1. Same  Procedure: 1. Cystoscopy, right ureteroscopy and laser lithotripsy, right retrograde pyelogram with intraoperative interpretation, right ureteral stent placement  Surgeon: Nickolas Madrid, MD  Anesthesia: General  Complications: None  Intraoperative findings:  1.  High bladder neck and difficult to fully examine bladder with his obesity, but no obvious tumors 2.  Uncomplicated dusting of large right renal stone and upper pole stone 3.  Uncomplicated stent placement  EBL: Minimal  Specimens: None  Drains: Right 6 French by 28 cm ureteral stent  Indication: James Clarke is a 79 y.o. patient who previously presented with a 1 cm right renal pelvis stone with possible obstruction and Enterococcus UTI who previously underwent stent placement with Dr. Gloriann Loan.  He presents today for definitive management with ureteroscopy.  After reviewing the management options for treatment, they elected to proceed with the above surgical procedure(s). We have discussed the potential benefits and risks of the procedure, side effects of the proposed treatment, the likelihood of the patient achieving the goals of the procedure, and any potential problems that might occur during the procedure or recuperation. Informed consent has been obtained.  Description of procedure:  The patient was taken to the operating room and general anesthesia was induced. SCDs were placed for DVT prophylaxis. The patient was placed in the dorsal lithotomy position, prepped and draped in the usual sterile fashion, and preoperative antibiotics(ampicillin) were administered. A preoperative time-out was performed.   A 21 French rigid cystoscope was used to intubate the urethra and a normal-appearing urethra was followed proximally in the bladder.  There was a high bladder neck, and cystoscopy was challenging  secondary to his morbid obesity.  There were no obvious bladder tumors.  A sensor wire was advanced alongside the right ureteral stent up into the kidney under fluoroscopic vision.  The old stent was then grasped and pulled to the meatus, and a second safety sensor wire was advanced through the stent up into the kidney under fluoroscopic vision.  The single-channel flexible ureteroscope advanced easily over the wire up into the kidney.  Thorough pyeloscopy revealed a large 1 cm stone in the renal pelvis, as well as a 6 mm stone in the upper pole.  A 200 m laser fiber was advanced to the scope and on settings of 0.5 J and 20 Hz both stones were methodically dusted to <1 mm fragments.  Thorough pyeloscopy revealed no residual stones in the kidney.  A retrograde pyelogram was performed from the proximal ureter and showed no extravasation or residual filling defects.  Careful pullback ureteroscopy revealed no ureteral injury or residual stones.  The rigid cystoscope was backloaded over the wire and a 6 [place] by 28 cm stent was uneventfully placed with an excellent curl in the upper pole, as well as under direct vision the bladder.  The bladder was drained, belladonna suppository was placed, and this concluded the procedure.  Disposition: Stable to PACU  Plan: Ampicillin prophylaxis while stent in place Follow-up in clinic for stent removal in 1 week  Nickolas Madrid, MD

## 2019-08-13 NOTE — Telephone Encounter (Signed)
OK to resume his lasix today as scheduled  Nickolas Madrid, MD 08/13/2019

## 2019-08-13 NOTE — Telephone Encounter (Signed)
Called pt's wife informed her of the information below, wife gave verbal understanding.

## 2019-08-14 NOTE — Anesthesia Postprocedure Evaluation (Signed)
Anesthesia Post Note  Patient: Ken Gene [place]  Procedure(s) Performed: CYSTOSCOPY/URETEROSCOPY/HOLMIUM LASER/STENT EXCHANGE (Right Ureter) CYSTOSCOPY WITH RETROGRADE PYELOGRAM (Right Ureter)  Patient location during evaluation: PACU Anesthesia Type: General Level of consciousness: awake and alert Pain management: pain level controlled Vital Signs Assessment: post-procedure vital signs reviewed and stable Respiratory status: spontaneous breathing, nonlabored ventilation and respiratory function stable Cardiovascular status: blood pressure returned to baseline and stable Postop Assessment: no apparent nausea or vomiting Anesthetic complications: no     Last Vitals:  Vitals:   08/13/19 1211 08/13/19 1223  BP:  (!) 124/57  Pulse:  85  Resp:  18  Temp: (!) 36.2 C   SpO2:  95%    Last Pain:  Vitals:   08/13/19 1211  TempSrc: Temporal  PainSc: 0-No pain                 Brett Canales Amberlynn Tempesta

## 2019-08-17 DIAGNOSIS — E1142 Type 2 diabetes mellitus with diabetic polyneuropathy: Secondary | ICD-10-CM | POA: Diagnosis not present

## 2019-08-19 ENCOUNTER — Encounter: Payer: Self-pay | Admitting: Urology

## 2019-08-19 ENCOUNTER — Ambulatory Visit (INDEPENDENT_AMBULATORY_CARE_PROVIDER_SITE_OTHER): Payer: PPO | Admitting: Urology

## 2019-08-19 ENCOUNTER — Other Ambulatory Visit: Payer: Self-pay

## 2019-08-19 VITALS — BP 150/70 | HR 96 | Ht 70.0 in | Wt 325.6 lb

## 2019-08-19 DIAGNOSIS — R3129 Other microscopic hematuria: Secondary | ICD-10-CM

## 2019-08-19 DIAGNOSIS — N200 Calculus of kidney: Secondary | ICD-10-CM

## 2019-08-19 MED ORDER — LIDOCAINE HCL URETHRAL/MUCOSAL 2 % EX GEL
1.0000 "application " | Freq: Once | CUTANEOUS | Status: AC
  Administered 2019-08-19: 1 via URETHRAL

## 2019-08-19 NOTE — Patient Instructions (Addendum)
Dietary Guidelines to Help Prevent Kidney Stones Kidney stones are deposits of minerals and salts that form inside your kidneys. Your risk of developing kidney stones may be greater depending on your diet, your lifestyle, the medicines you take, and whether you have certain medical conditions. Most people can reduce their chances of developing kidney stones by following the instructions below. Depending on your overall health and the type of kidney stones you tend to develop, your dietitian may give you more specific instructions. What are tips for following this plan? Reading food labels  Choose foods with "no salt added" or "low-salt" labels. Limit your sodium intake to less than 1500 mg per day.  Choose foods with calcium for each meal and snack. Try to eat about 300 mg of calcium at each meal. Foods that contain 200-500 mg of calcium per serving include: ? 8 oz (237 ml) of milk, fortified nondairy milk, and fortified fruit juice. ? 8 oz (237 ml) of kefir, yogurt, and soy yogurt. ? 4 oz (118 ml) of tofu. ? 1 oz of cheese. ? 1 cup (300 g) of dried figs. ? 1 cup (91 g) of cooked broccoli. ? 1-3 oz can of sardines or mackerel.  Most people need 1000 to 1500 mg of calcium each day. Talk to your dietitian about how much calcium is recommended for you. Shopping  Buy plenty of fresh fruits and vegetables. Most people do not need to avoid fruits and vegetables, even if they contain nutrients that may contribute to kidney stones.  When shopping for convenience foods, choose: ? Whole pieces of fruit. ? Premade salads with dressing on the side. ? Low-fat fruit and yogurt smoothies.  Avoid buying frozen meals or prepared deli foods.  Look for foods with live cultures, such as yogurt and kefir. Cooking  Do not add salt to food when cooking. Place a salt shaker on the table and allow each person to add his or her own salt to taste.  Use vegetable protein, such as beans, textured vegetable  protein (TVP), or tofu instead of meat in pasta, casseroles, and soups. Meal planning   Eat less salt, if told by your dietitian. To do this: ? Avoid eating processed or premade food. ? Avoid eating fast food.  Eat less animal protein, including cheese, meat, poultry, or fish, if told by your dietitian. To do this: ? Limit the number of times you have meat, poultry, fish, or cheese each week. Eat a diet free of meat at least 2 days a week. ? Eat only one serving each day of meat, poultry, fish, or seafood. ? When you prepare animal protein, cut pieces into small portion sizes. For most meat and fish, one serving is about the size of one deck of cards.  Eat at least 5 servings of fresh fruits and vegetables each day. To do this: ? Keep fruits and vegetables on hand for snacks. ? Eat 1 piece of fruit or a handful of berries with breakfast. ? Have a salad and fruit at lunch. ? Have two kinds of vegetables at dinner.  Limit foods that are high in a substance called oxalate. These include: ? Spinach. ? Rhubarb. ? Beets. ? Potato chips and french fries. ? Nuts.  If you regularly take a diuretic medicine, make sure to eat at least 1-2 fruits or vegetables high in potassium each day. These include: ? Avocado. ? Banana. ? Orange, prune, carrot, or tomato juice. ? Baked potato. ? Cabbage. ? Beans and split   peas. General instructions   Drink enough fluid to keep your urine clear or pale yellow. This is the most important thing you can do.  Talk to your health care provider and dietitian about taking daily supplements. Depending on your health and the cause of your kidney stones, you may be advised: ? Not to take supplements with vitamin C. ? To take a calcium supplement. ? To take a daily probiotic supplement. ? To take other supplements such as magnesium, fish oil, or vitamin B6.  Take all medicines and supplements as told by your health care provider.  Limit alcohol intake to no  more than 1 drink a day for nonpregnant women and 2 drinks a day for men. One drink equals 12 oz of beer, 5 oz of wine, or 1 oz of hard liquor.  Lose weight if told by your health care provider. Work with your dietitian to find strategies and an eating plan that works best for you. What foods are not recommended? Limit your intake of the following foods, or as told by your dietitian. Talk to your dietitian about specific foods you should avoid based on the type of kidney stones and your overall health. Grains Breads. Bagels. Rolls. Baked goods. Salted crackers. Cereal. Pasta. Vegetables Spinach. Rhubarb. Beets. Canned vegetables. Angie Fava. Olives. Meats and other protein foods Nuts. Nut butters. Large portions of meat, poultry, or fish. Salted or cured meats. Deli meats. Hot dogs. Sausages. Dairy Cheese. Beverages Regular soft drinks. Regular vegetable juice. Seasonings and other foods Seasoning blends with salt. Salad dressings. Canned soups. Soy sauce. Ketchup. Barbecue sauce. Canned pasta sauce. Casseroles. Pizza. Lasagna. Frozen meals. Potato chips. [place] fries. Summary  You can reduce your risk of kidney stones by making changes to your diet.  The most important thing you can do is drink enough fluid. You should drink enough fluid to keep your urine clear or pale yellow.  Ask your health care provider or dietitian how much protein from animal sources you should eat each day, and also how much salt and calcium you should have each day. This information is not intended to replace advice given to you by your health care provider. Make sure you discuss any questions you have with your health care provider. Document Revised: 10/28/2018 Document Reviewed: 06/18/2016 Elsevier Patient Education  [place]. Ureteral Stent Implantation, Care After This sheet gives you information about how to care for yourself after your procedure. Your health care provider may also give you more  specific instructions. If you have problems or questions, contact your health care provider. What can I expect after the procedure? After the procedure, it is common to have:  Nausea.  Mild pain when you urinate. You may feel this pain in your lower back or lower abdomen. The pain should stop within a few minutes after you urinate. This may last for up to 1 week.  A small amount of blood in your urine for several days. Follow these instructions at home: Medicines  Take over-the-counter and prescription medicines only as told by your health care provider.  If you were prescribed an antibiotic medicine, take it as told by your health care provider. Do not stop taking the antibiotic even if you start to feel better.  Do not drive for 24 hours if you were given a sedative during your procedure.  Ask your health care provider if the medicine prescribed to you requires you to avoid driving or using heavy machinery. Activity  Rest as told by  your health care provider.  Avoid sitting for a long time without moving. Get up to take short walks every 1-2 hours. This is important to improve blood flow and breathing. Ask for help if you feel weak or unsteady.  Return to your normal activities as told by your health care provider. Ask your health care provider what activities are safe for you. General instructions   Watch for any blood in your urine. Call your health care provider if the amount of blood in your urine increases.  If you have a catheter: ? Follow instructions from your health care provider about taking care of your catheter and collection bag. ? Do not take baths, swim, or use a hot tub until your health care provider approves. Ask your health care provider if you may take showers. You may only be allowed to take sponge baths.  Drink enough fluid to keep your urine pale yellow.  Do not use any products that contain nicotine or tobacco, such as cigarettes, e-cigarettes, and  chewing tobacco. These can delay healing after surgery. If you need help quitting, ask your health care provider.  Keep all follow-up visits as told by your health care provider. This is important. Contact a health care provider if:  You have pain that gets worse or does not get better with medicine, especially pain when you urinate.  You have difficulty urinating.  You feel nauseous or you vomit repeatedly during a period of more than 2 days after the procedure. Get help right away if:  Your urine is dark red or has blood clots in it.  You are leaking urine (have incontinence).  The end of the stent comes out of your urethra.  You cannot urinate.  You have sudden, sharp, or severe pain in your abdomen or lower back.  You have a fever.  You have swelling or pain in your legs.  You have difficulty breathing. Summary  After the procedure, it is common to have mild pain when you urinate that goes away within a few minutes after you urinate. This may last for up to 1 week.  Watch for any blood in your urine. Call your health care provider if the amount of blood in your urine increases.  Take over-the-counter and prescription medicines only as told by your health care provider.  Drink enough fluid to keep your urine pale yellow. This information is not intended to replace advice given to you by your health care provider. Make sure you discuss any questions you have with your health care provider. Document Revised: 04/14/2018 Document Reviewed: 04/15/2018 Elsevier Patient Education  2020 [employer].

## 2019-08-19 NOTE — Progress Notes (Signed)
Cystoscopy Procedure Note:  Indication: Stent removal s/p right URS/LL/stent for ~1.5cm renal stone burden  After informed consent and discussion of the procedure and its risks, James Clarke was positioned and prepped in the standard fashion. Cystoscopy was performed with a flexible cystoscope. The stent was grasped with flexible graspers and removed in its entirety. The patient tolerated the procedure well.  Findings: Uncomplicated stent removal  Assessment and Plan: We discussed general stone prevention strategies including adequate hydration with goal of producing 2.5 L of urine daily, increasing citric acid intake, increasing calcium intake during high oxalate meals, minimizing animal protein, and decreasing salt intake. Information about dietary recommendations given today.   Follow up in 6 months KUB  Billey Co, MD 08/19/2019

## 2019-08-20 LAB — URINALYSIS, COMPLETE
Bilirubin, UA: NEGATIVE
Glucose, UA: NEGATIVE
Ketones, UA: NEGATIVE
Nitrite, UA: NEGATIVE
Specific Gravity, UA: 1.02 (ref 1.005–1.030)
Urobilinogen, Ur: 0.2 mg/dL (ref 0.2–1.0)
pH, UA: 5 (ref 5.0–7.5)

## 2019-08-20 LAB — MICROSCOPIC EXAMINATION: Bacteria, UA: NONE SEEN

## 2019-08-23 DIAGNOSIS — E1121 Type 2 diabetes mellitus with diabetic nephropathy: Secondary | ICD-10-CM | POA: Diagnosis not present

## 2019-08-23 DIAGNOSIS — Z9289 Personal history of other medical treatment: Secondary | ICD-10-CM | POA: Diagnosis not present

## 2019-08-23 DIAGNOSIS — E1142 Type 2 diabetes mellitus with diabetic polyneuropathy: Secondary | ICD-10-CM | POA: Diagnosis not present

## 2019-08-23 DIAGNOSIS — E1169 Type 2 diabetes mellitus with other specified complication: Secondary | ICD-10-CM | POA: Diagnosis not present

## 2019-08-23 DIAGNOSIS — I10 Essential (primary) hypertension: Secondary | ICD-10-CM | POA: Diagnosis not present

## 2019-08-23 DIAGNOSIS — E669 Obesity, unspecified: Secondary | ICD-10-CM | POA: Diagnosis not present

## 2019-08-23 DIAGNOSIS — R809 Proteinuria, unspecified: Secondary | ICD-10-CM | POA: Diagnosis not present

## 2019-08-23 DIAGNOSIS — Z794 Long term (current) use of insulin: Secondary | ICD-10-CM | POA: Diagnosis not present

## 2019-08-23 DIAGNOSIS — E1129 Type 2 diabetes mellitus with other diabetic kidney complication: Secondary | ICD-10-CM | POA: Diagnosis not present

## 2019-08-30 DIAGNOSIS — N184 Chronic kidney disease, stage 4 (severe): Secondary | ICD-10-CM | POA: Diagnosis not present

## 2019-08-30 DIAGNOSIS — E1122 Type 2 diabetes mellitus with diabetic chronic kidney disease: Secondary | ICD-10-CM | POA: Diagnosis not present

## 2019-08-30 DIAGNOSIS — R600 Localized edema: Secondary | ICD-10-CM | POA: Diagnosis not present

## 2019-08-30 DIAGNOSIS — D631 Anemia in chronic kidney disease: Secondary | ICD-10-CM | POA: Diagnosis not present

## 2019-08-30 DIAGNOSIS — N2581 Secondary hyperparathyroidism of renal origin: Secondary | ICD-10-CM | POA: Diagnosis not present

## 2019-09-08 ENCOUNTER — Encounter: Payer: Self-pay | Admitting: *Deleted

## 2019-09-21 DIAGNOSIS — E1169 Type 2 diabetes mellitus with other specified complication: Secondary | ICD-10-CM | POA: Diagnosis not present

## 2019-09-21 DIAGNOSIS — N184 Chronic kidney disease, stage 4 (severe): Secondary | ICD-10-CM | POA: Diagnosis not present

## 2019-09-21 DIAGNOSIS — I10 Essential (primary) hypertension: Secondary | ICD-10-CM | POA: Diagnosis not present

## 2019-09-21 DIAGNOSIS — I700 Atherosclerosis of aorta: Secondary | ICD-10-CM | POA: Diagnosis not present

## 2019-09-21 DIAGNOSIS — R600 Localized edema: Secondary | ICD-10-CM | POA: Diagnosis not present

## 2019-09-21 DIAGNOSIS — E1122 Type 2 diabetes mellitus with diabetic chronic kidney disease: Secondary | ICD-10-CM | POA: Diagnosis not present

## 2019-09-21 DIAGNOSIS — G4733 Obstructive sleep apnea (adult) (pediatric): Secondary | ICD-10-CM | POA: Diagnosis not present

## 2019-09-21 DIAGNOSIS — E669 Obesity, unspecified: Secondary | ICD-10-CM | POA: Diagnosis not present

## 2019-09-21 DIAGNOSIS — Z794 Long term (current) use of insulin: Secondary | ICD-10-CM | POA: Diagnosis not present

## 2019-09-21 DIAGNOSIS — E7800 Pure hypercholesterolemia, unspecified: Secondary | ICD-10-CM | POA: Diagnosis not present

## 2019-09-29 DIAGNOSIS — M5416 Radiculopathy, lumbar region: Secondary | ICD-10-CM | POA: Diagnosis not present

## 2019-09-29 DIAGNOSIS — M9903 Segmental and somatic dysfunction of lumbar region: Secondary | ICD-10-CM | POA: Diagnosis not present

## 2019-09-29 DIAGNOSIS — M9904 Segmental and somatic dysfunction of sacral region: Secondary | ICD-10-CM | POA: Diagnosis not present

## 2019-09-29 DIAGNOSIS — M5417 Radiculopathy, lumbosacral region: Secondary | ICD-10-CM | POA: Diagnosis not present

## 2019-09-30 DIAGNOSIS — M9904 Segmental and somatic dysfunction of sacral region: Secondary | ICD-10-CM | POA: Diagnosis not present

## 2019-09-30 DIAGNOSIS — M545 Low back pain: Secondary | ICD-10-CM | POA: Diagnosis not present

## 2019-09-30 DIAGNOSIS — M9903 Segmental and somatic dysfunction of lumbar region: Secondary | ICD-10-CM | POA: Diagnosis not present

## 2019-09-30 DIAGNOSIS — M5416 Radiculopathy, lumbar region: Secondary | ICD-10-CM | POA: Diagnosis not present

## 2019-09-30 DIAGNOSIS — M7918 Myalgia, other site: Secondary | ICD-10-CM | POA: Diagnosis not present

## 2019-09-30 DIAGNOSIS — M5137 Other intervertebral disc degeneration, lumbosacral region: Secondary | ICD-10-CM | POA: Diagnosis not present

## 2019-09-30 DIAGNOSIS — M5417 Radiculopathy, lumbosacral region: Secondary | ICD-10-CM | POA: Diagnosis not present

## 2019-09-30 DIAGNOSIS — M5136 Other intervertebral disc degeneration, lumbar region: Secondary | ICD-10-CM | POA: Diagnosis not present

## 2019-10-05 DIAGNOSIS — I10 Essential (primary) hypertension: Secondary | ICD-10-CM | POA: Diagnosis not present

## 2019-10-05 DIAGNOSIS — E1129 Type 2 diabetes mellitus with other diabetic kidney complication: Secondary | ICD-10-CM | POA: Diagnosis not present

## 2019-10-05 DIAGNOSIS — E1169 Type 2 diabetes mellitus with other specified complication: Secondary | ICD-10-CM | POA: Diagnosis not present

## 2019-10-05 DIAGNOSIS — E1122 Type 2 diabetes mellitus with diabetic chronic kidney disease: Secondary | ICD-10-CM | POA: Diagnosis not present

## 2019-10-05 DIAGNOSIS — Z794 Long term (current) use of insulin: Secondary | ICD-10-CM | POA: Diagnosis not present

## 2019-10-05 DIAGNOSIS — E669 Obesity, unspecified: Secondary | ICD-10-CM | POA: Diagnosis not present

## 2019-10-05 DIAGNOSIS — R809 Proteinuria, unspecified: Secondary | ICD-10-CM | POA: Diagnosis not present

## 2019-10-05 DIAGNOSIS — E1121 Type 2 diabetes mellitus with diabetic nephropathy: Secondary | ICD-10-CM | POA: Diagnosis not present

## 2019-10-05 DIAGNOSIS — N184 Chronic kidney disease, stage 4 (severe): Secondary | ICD-10-CM | POA: Diagnosis not present

## 2019-10-05 DIAGNOSIS — E1142 Type 2 diabetes mellitus with diabetic polyneuropathy: Secondary | ICD-10-CM | POA: Diagnosis not present

## 2019-10-18 DIAGNOSIS — B351 Tinea unguium: Secondary | ICD-10-CM | POA: Diagnosis not present

## 2019-10-18 DIAGNOSIS — Z794 Long term (current) use of insulin: Secondary | ICD-10-CM | POA: Diagnosis not present

## 2019-10-18 DIAGNOSIS — E1140 Type 2 diabetes mellitus with diabetic neuropathy, unspecified: Secondary | ICD-10-CM | POA: Diagnosis not present

## 2019-11-30 DIAGNOSIS — I10 Essential (primary) hypertension: Secondary | ICD-10-CM | POA: Diagnosis not present

## 2019-11-30 DIAGNOSIS — R42 Dizziness and giddiness: Secondary | ICD-10-CM | POA: Diagnosis not present

## 2019-11-30 DIAGNOSIS — E1122 Type 2 diabetes mellitus with diabetic chronic kidney disease: Secondary | ICD-10-CM | POA: Diagnosis not present

## 2019-11-30 DIAGNOSIS — E7800 Pure hypercholesterolemia, unspecified: Secondary | ICD-10-CM | POA: Diagnosis not present

## 2019-11-30 DIAGNOSIS — Z6841 Body Mass Index (BMI) 40.0 and over, adult: Secondary | ICD-10-CM | POA: Diagnosis not present

## 2019-11-30 DIAGNOSIS — E1140 Type 2 diabetes mellitus with diabetic neuropathy, unspecified: Secondary | ICD-10-CM | POA: Diagnosis not present

## 2019-11-30 DIAGNOSIS — G4733 Obstructive sleep apnea (adult) (pediatric): Secondary | ICD-10-CM | POA: Diagnosis not present

## 2019-11-30 DIAGNOSIS — J301 Allergic rhinitis due to pollen: Secondary | ICD-10-CM | POA: Diagnosis not present

## 2019-11-30 DIAGNOSIS — N184 Chronic kidney disease, stage 4 (severe): Secondary | ICD-10-CM | POA: Diagnosis not present

## 2019-11-30 DIAGNOSIS — Z794 Long term (current) use of insulin: Secondary | ICD-10-CM | POA: Diagnosis not present

## 2019-12-09 DIAGNOSIS — E1122 Type 2 diabetes mellitus with diabetic chronic kidney disease: Secondary | ICD-10-CM | POA: Diagnosis not present

## 2019-12-09 DIAGNOSIS — R809 Proteinuria, unspecified: Secondary | ICD-10-CM | POA: Diagnosis not present

## 2019-12-09 DIAGNOSIS — N2581 Secondary hyperparathyroidism of renal origin: Secondary | ICD-10-CM | POA: Diagnosis not present

## 2019-12-09 DIAGNOSIS — N184 Chronic kidney disease, stage 4 (severe): Secondary | ICD-10-CM | POA: Diagnosis not present

## 2019-12-09 DIAGNOSIS — D631 Anemia in chronic kidney disease: Secondary | ICD-10-CM | POA: Diagnosis not present

## 2019-12-09 DIAGNOSIS — N1832 Chronic kidney disease, stage 3b: Secondary | ICD-10-CM | POA: Diagnosis not present

## 2019-12-09 DIAGNOSIS — I10 Essential (primary) hypertension: Secondary | ICD-10-CM | POA: Diagnosis not present

## 2019-12-09 DIAGNOSIS — R600 Localized edema: Secondary | ICD-10-CM | POA: Diagnosis not present

## 2019-12-17 DIAGNOSIS — N184 Chronic kidney disease, stage 4 (severe): Secondary | ICD-10-CM | POA: Diagnosis not present

## 2019-12-17 DIAGNOSIS — I10 Essential (primary) hypertension: Secondary | ICD-10-CM | POA: Diagnosis not present

## 2019-12-30 DIAGNOSIS — L820 Inflamed seborrheic keratosis: Secondary | ICD-10-CM | POA: Diagnosis not present

## 2019-12-30 DIAGNOSIS — N184 Chronic kidney disease, stage 4 (severe): Secondary | ICD-10-CM | POA: Diagnosis not present

## 2019-12-30 DIAGNOSIS — Z872 Personal history of diseases of the skin and subcutaneous tissue: Secondary | ICD-10-CM | POA: Diagnosis not present

## 2019-12-30 DIAGNOSIS — L918 Other hypertrophic disorders of the skin: Secondary | ICD-10-CM | POA: Diagnosis not present

## 2019-12-30 DIAGNOSIS — D631 Anemia in chronic kidney disease: Secondary | ICD-10-CM | POA: Diagnosis not present

## 2019-12-30 DIAGNOSIS — Z86018 Personal history of other benign neoplasm: Secondary | ICD-10-CM | POA: Diagnosis not present

## 2019-12-30 DIAGNOSIS — L578 Other skin changes due to chronic exposure to nonionizing radiation: Secondary | ICD-10-CM | POA: Diagnosis not present

## 2020-01-11 DIAGNOSIS — L570 Actinic keratosis: Secondary | ICD-10-CM | POA: Diagnosis not present

## 2020-01-11 DIAGNOSIS — N184 Chronic kidney disease, stage 4 (severe): Secondary | ICD-10-CM | POA: Diagnosis not present

## 2020-01-19 DIAGNOSIS — B351 Tinea unguium: Secondary | ICD-10-CM | POA: Diagnosis not present

## 2020-01-19 DIAGNOSIS — E1140 Type 2 diabetes mellitus with diabetic neuropathy, unspecified: Secondary | ICD-10-CM | POA: Diagnosis not present

## 2020-01-19 DIAGNOSIS — Z794 Long term (current) use of insulin: Secondary | ICD-10-CM | POA: Diagnosis not present

## 2020-02-15 ENCOUNTER — Other Ambulatory Visit: Payer: Self-pay

## 2020-02-15 ENCOUNTER — Ambulatory Visit
Admission: RE | Admit: 2020-02-15 | Discharge: 2020-02-15 | Disposition: A | Discharge status: Home / Self Care | Payer: PPO | Attending: Urology | Admitting: Urology

## 2020-02-15 ENCOUNTER — Ambulatory Visit
Admission: RE | Admit: 2020-02-15 | Discharge: 2020-02-15 | Disposition: A | Discharge status: Home / Self Care | Payer: PPO | Source: Ambulatory Visit | Attending: Urology | Admitting: Urology

## 2020-02-15 DIAGNOSIS — N200 Calculus of kidney: Secondary | ICD-10-CM | POA: Diagnosis not present

## 2020-02-15 DIAGNOSIS — I878 Other specified disorders of veins: Secondary | ICD-10-CM | POA: Diagnosis not present

## 2020-02-15 DIAGNOSIS — M47816 Spondylosis without myelopathy or radiculopathy, lumbar region: Secondary | ICD-10-CM | POA: Diagnosis not present

## 2020-02-15 DIAGNOSIS — E1142 Type 2 diabetes mellitus with diabetic polyneuropathy: Secondary | ICD-10-CM | POA: Diagnosis not present

## 2020-02-16 ENCOUNTER — Encounter: Payer: Self-pay | Admitting: Urology

## 2020-02-16 ENCOUNTER — Ambulatory Visit (INDEPENDENT_AMBULATORY_CARE_PROVIDER_SITE_OTHER): Payer: PPO | Admitting: Urology

## 2020-02-16 VITALS — BP 152/83 | HR 80 | Ht 70.0 in | Wt 325.0 lb

## 2020-02-16 DIAGNOSIS — N200 Calculus of kidney: Secondary | ICD-10-CM | POA: Diagnosis not present

## 2020-02-16 NOTE — Progress Notes (Signed)
   02/16/2020 3:30 PM   James Clarke 02-22-1941 [id number]  Reason for visit: Follow up nephrolithiasis  HPI: 79 year old male with morbid obesity and CKD here for follow-up of nephrolithiasis.  He underwent uncomplicated right ureteroscopy, laser lithotripsy, and stent placement in January 2021 for a 1 cm right renal stone.  He denies any problems since our last visit.  He has not had any hematuria or flank pain.  I personally reviewed his KUB today, and I do not appreciate any distinct radiopaque calculi.  His only complaint today is some urgency and frequency with urination, but he drinks mostly diet drinks during the day.  He has nocturia only once per night.  We discussed behavioral strategies including modifying his fluid intake, and weight loss for his urinary symptoms. We discussed general stone prevention strategies including adequate hydration with goal of producing 2.5 L of urine daily, increasing citric acid intake, increasing calcium intake during high oxalate meals, minimizing animal protein, and decreasing salt intake. Information about dietary recommendations given today.   RTC 1 year for [place], MD  [facility] [address], [place], [place] [address] [phone]

## 2020-02-16 NOTE — Patient Instructions (Signed)
Dietary Guidelines to Help Prevent Kidney Stones Kidney stones are deposits of minerals and salts that form inside your kidneys. Your risk of developing kidney stones may be greater depending on your diet, your lifestyle, the medicines you take, and whether you have certain medical conditions. Most people can reduce their chances of developing kidney stones by following the instructions below. Depending on your overall health and the type of kidney stones you tend to develop, your dietitian may give you more specific instructions. What are tips for following this plan? Reading food labels  Choose foods with "no salt added" or "low-salt" labels. Limit your sodium intake to less than 1500 mg per day.  Choose foods with calcium for each meal and snack. Try to eat about 300 mg of calcium at each meal. Foods that contain 200-500 mg of calcium per serving include: ? 8 oz (237 ml) of milk, fortified nondairy milk, and fortified fruit juice. ? 8 oz (237 ml) of kefir, yogurt, and soy yogurt. ? 4 oz (118 ml) of tofu. ? 1 oz of cheese. ? 1 cup (300 g) of dried figs. ? 1 cup (91 g) of cooked broccoli. ? 1-3 oz can of sardines or mackerel.  Most people need 1000 to 1500 mg of calcium each day. Talk to your dietitian about how much calcium is recommended for you. Shopping  Buy plenty of fresh fruits and vegetables. Most people do not need to avoid fruits and vegetables, even if they contain nutrients that may contribute to kidney stones.  When shopping for convenience foods, choose: ? Whole pieces of fruit. ? Premade salads with dressing on the side. ? Low-fat fruit and yogurt smoothies.  Avoid buying frozen meals or prepared deli foods.  Look for foods with live cultures, such as yogurt and kefir. Cooking  Do not add salt to food when cooking. Place a salt shaker on the table and allow each person to add his or her own salt to taste.  Use vegetable protein, such as beans, textured vegetable  protein (TVP), or tofu instead of meat in pasta, casseroles, and soups. Meal planning   Eat less salt, if told by your dietitian. To do this: ? Avoid eating processed or premade food. ? Avoid eating fast food.  Eat less animal protein, including cheese, meat, poultry, or fish, if told by your dietitian. To do this: ? Limit the number of times you have meat, poultry, fish, or cheese each week. Eat a diet free of meat at least 2 days a week. ? Eat only one serving each day of meat, poultry, fish, or seafood. ? When you prepare animal protein, cut pieces into small portion sizes. For most meat and fish, one serving is about the size of one deck of cards.  Eat at least 5 servings of fresh fruits and vegetables each day. To do this: ? Keep fruits and vegetables on hand for snacks. ? Eat 1 piece of fruit or a handful of berries with breakfast. ? Have a salad and fruit at lunch. ? Have two kinds of vegetables at dinner.  Limit foods that are high in a substance called oxalate. These include: ? Spinach. ? Rhubarb. ? Beets. ? Potato chips and french fries. ? Nuts.  If you regularly take a diuretic medicine, make sure to eat at least 1-2 fruits or vegetables high in potassium each day. These include: ? Avocado. ? Banana. ? Orange, prune, carrot, or tomato juice. ? Baked potato. ? Cabbage. ? Beans and split   peas. General instructions   Drink enough fluid to keep your urine clear or pale yellow. This is the most important thing you can do.  Talk to your health care provider and dietitian about taking daily supplements. Depending on your health and the cause of your kidney stones, you may be advised: ? Not to take supplements with vitamin C. ? To take a calcium supplement. ? To take a daily probiotic supplement. ? To take other supplements such as magnesium, fish oil, or vitamin B6.  Take all medicines and supplements as told by your health care provider.  Limit alcohol intake to no  more than 1 drink a day for nonpregnant women and 2 drinks a day for men. One drink equals 12 oz of beer, 5 oz of wine, or 1 oz of hard liquor.  Lose weight if told by your health care provider. Work with your dietitian to find strategies and an eating plan that works best for you. What foods are not recommended? Limit your intake of the following foods, or as told by your dietitian. Talk to your dietitian about specific foods you should avoid based on the type of kidney stones and your overall health. Grains Breads. Bagels. Rolls. Baked goods. Salted crackers. Cereal. Pasta. Vegetables Spinach. Rhubarb. Beets. Canned vegetables. Pickles. Olives. Meats and other protein foods Nuts. Nut butters. Large portions of meat, poultry, or fish. Salted or cured meats. Deli meats. Hot dogs. Sausages. Dairy Cheese. Beverages Regular soft drinks. Regular vegetable juice. Seasonings and other foods Seasoning blends with salt. Salad dressings. Canned soups. Soy sauce. Ketchup. Barbecue sauce. Canned pasta sauce. Casseroles. Pizza. Lasagna. Frozen meals. Potato chips. French fries. Summary  You can reduce your risk of kidney stones by making changes to your diet.  The most important thing you can do is drink enough fluid. You should drink enough fluid to keep your urine clear or pale yellow.  Ask your health care provider or dietitian how much protein from animal sources you should eat each day, and also how much salt and calcium you should have each day. This information is not intended to replace advice given to you by your health care provider. Make sure you discuss any questions you have with your health care provider. Document Revised: 10/28/2018 Document Reviewed: 06/18/2016 Elsevier Patient Education  2020 [employer] Inc[employer]  

## 2020-02-22 DIAGNOSIS — E669 Obesity, unspecified: Secondary | ICD-10-CM | POA: Diagnosis not present

## 2020-02-22 DIAGNOSIS — N184 Chronic kidney disease, stage 4 (severe): Secondary | ICD-10-CM | POA: Diagnosis not present

## 2020-02-22 DIAGNOSIS — E1121 Type 2 diabetes mellitus with diabetic nephropathy: Secondary | ICD-10-CM | POA: Diagnosis not present

## 2020-02-22 DIAGNOSIS — E1169 Type 2 diabetes mellitus with other specified complication: Secondary | ICD-10-CM | POA: Diagnosis not present

## 2020-02-22 DIAGNOSIS — E1129 Type 2 diabetes mellitus with other diabetic kidney complication: Secondary | ICD-10-CM | POA: Diagnosis not present

## 2020-02-22 DIAGNOSIS — R809 Proteinuria, unspecified: Secondary | ICD-10-CM | POA: Diagnosis not present

## 2020-02-22 DIAGNOSIS — E1122 Type 2 diabetes mellitus with diabetic chronic kidney disease: Secondary | ICD-10-CM | POA: Diagnosis not present

## 2020-02-22 DIAGNOSIS — I10 Essential (primary) hypertension: Secondary | ICD-10-CM | POA: Diagnosis not present

## 2020-02-22 DIAGNOSIS — E1142 Type 2 diabetes mellitus with diabetic polyneuropathy: Secondary | ICD-10-CM | POA: Diagnosis not present

## 2020-02-22 DIAGNOSIS — Z794 Long term (current) use of insulin: Secondary | ICD-10-CM | POA: Diagnosis not present

## 2020-02-24 DIAGNOSIS — G4733 Obstructive sleep apnea (adult) (pediatric): Secondary | ICD-10-CM | POA: Diagnosis not present

## 2020-02-25 DIAGNOSIS — E1122 Type 2 diabetes mellitus with diabetic chronic kidney disease: Secondary | ICD-10-CM | POA: Diagnosis not present

## 2020-02-25 DIAGNOSIS — N184 Chronic kidney disease, stage 4 (severe): Secondary | ICD-10-CM | POA: Diagnosis not present

## 2020-03-09 DIAGNOSIS — N184 Chronic kidney disease, stage 4 (severe): Secondary | ICD-10-CM | POA: Diagnosis not present

## 2020-03-13 ENCOUNTER — Ambulatory Visit
Admission: RE | Admit: 2020-03-13 | Discharge: 2020-03-13 | Disposition: A | Discharge status: Home / Self Care | Payer: PPO | Source: Ambulatory Visit | Attending: Nephrology | Admitting: Nephrology

## 2020-03-13 ENCOUNTER — Other Ambulatory Visit: Payer: Self-pay

## 2020-03-13 DIAGNOSIS — N184 Chronic kidney disease, stage 4 (severe): Secondary | ICD-10-CM | POA: Diagnosis not present

## 2020-03-13 LAB — COMPREHENSIVE METABOLIC PANEL
ALT: 15 U/L (ref 0–44)
ALT: 14 U/L (ref 0–44)
AST: 18 U/L (ref 15–41)
AST: 18 U/L (ref 15–41)
Albumin: 3.3 g/dL — ABNORMAL LOW (ref 3.5–5.0)
Albumin: 3.5 g/dL (ref 3.5–5.0)
Alkaline Phosphatase: 64 U/L (ref 38–126)
Alkaline Phosphatase: 65 U/L (ref 38–126)
Anion gap: 11 (ref 5–15)
Anion gap: 9 (ref 5–15)
BUN: 68 mg/dL — ABNORMAL HIGH (ref 8–23)
BUN: 63 mg/dL — ABNORMAL HIGH (ref 8–23)
CO2: 27 mmol/L (ref 22–32)
CO2: 26 mmol/L (ref 22–32)
Calcium: 9.3 mg/dL (ref 8.9–10.3)
Calcium: 9.4 mg/dL (ref 8.9–10.3)
Chloride: 101 mmol/L (ref 98–111)
Chloride: 104 mmol/L (ref 98–111)
Creatinine, Ser: 2.47 mg/dL — ABNORMAL HIGH (ref 0.61–1.24)
Creatinine, Ser: 2.34 mg/dL — ABNORMAL HIGH (ref 0.61–1.24)
GFR calc Af Amer: 28 mL/min — ABNORMAL LOW (ref >60)
GFR calc Af Amer: 30 mL/min — ABNORMAL LOW (ref >60)
GFR calc non Af Amer: 24 mL/min — ABNORMAL LOW (ref >60)
GFR calc non Af Amer: 25 mL/min — ABNORMAL LOW (ref >60)
Glucose, Bld: 197 mg/dL — ABNORMAL HIGH (ref 70–99)
Glucose, Bld: 148 mg/dL — ABNORMAL HIGH (ref 70–99)
Potassium: 4 mmol/L (ref 3.5–5.1)
Potassium: 4 mmol/L (ref 3.5–5.1)
Sodium: 139 mmol/L (ref 135–145)
Sodium: 139 mmol/L (ref 135–145)
Total Bilirubin: 0.6 mg/dL (ref 0.3–1.2)
Total Bilirubin: 0.5 mg/dL (ref 0.3–1.2)
Total Protein: 6.9 g/dL (ref 6.5–8.1)
Total Protein: 7.2 g/dL (ref 6.5–8.1)

## 2020-03-13 MED ORDER — SODIUM CHLORIDE 0.9 % IV SOLN: INTRAVENOUS | Status: DC

## 2020-03-31 DIAGNOSIS — R42 Dizziness and giddiness: Secondary | ICD-10-CM | POA: Diagnosis not present

## 2020-03-31 DIAGNOSIS — H90A22 Sensorineural hearing loss, unilateral, left ear, with restricted hearing on the contralateral side: Secondary | ICD-10-CM | POA: Diagnosis not present

## 2020-03-31 DIAGNOSIS — H903 Sensorineural hearing loss, bilateral: Secondary | ICD-10-CM | POA: Diagnosis not present

## 2020-04-05 DIAGNOSIS — Z794 Long term (current) use of insulin: Secondary | ICD-10-CM | POA: Diagnosis not present

## 2020-04-05 DIAGNOSIS — E669 Obesity, unspecified: Secondary | ICD-10-CM | POA: Diagnosis not present

## 2020-04-05 DIAGNOSIS — I10 Essential (primary) hypertension: Secondary | ICD-10-CM | POA: Diagnosis not present

## 2020-04-05 DIAGNOSIS — E7800 Pure hypercholesterolemia, unspecified: Secondary | ICD-10-CM | POA: Diagnosis not present

## 2020-04-05 DIAGNOSIS — E1122 Type 2 diabetes mellitus with diabetic chronic kidney disease: Secondary | ICD-10-CM | POA: Diagnosis not present

## 2020-04-05 DIAGNOSIS — I700 Atherosclerosis of aorta: Secondary | ICD-10-CM | POA: Diagnosis not present

## 2020-04-05 DIAGNOSIS — N184 Chronic kidney disease, stage 4 (severe): Secondary | ICD-10-CM | POA: Diagnosis not present

## 2020-04-05 DIAGNOSIS — R600 Localized edema: Secondary | ICD-10-CM | POA: Diagnosis not present

## 2020-04-05 DIAGNOSIS — E1169 Type 2 diabetes mellitus with other specified complication: Secondary | ICD-10-CM | POA: Diagnosis not present

## 2020-04-20 DIAGNOSIS — B351 Tinea unguium: Secondary | ICD-10-CM | POA: Diagnosis not present

## 2020-04-20 DIAGNOSIS — R809 Proteinuria, unspecified: Secondary | ICD-10-CM | POA: Diagnosis not present

## 2020-04-20 DIAGNOSIS — Z794 Long term (current) use of insulin: Secondary | ICD-10-CM | POA: Diagnosis not present

## 2020-04-20 DIAGNOSIS — I10 Essential (primary) hypertension: Secondary | ICD-10-CM | POA: Diagnosis not present

## 2020-04-20 DIAGNOSIS — N2581 Secondary hyperparathyroidism of renal origin: Secondary | ICD-10-CM | POA: Diagnosis not present

## 2020-04-20 DIAGNOSIS — E1140 Type 2 diabetes mellitus with diabetic neuropathy, unspecified: Secondary | ICD-10-CM | POA: Diagnosis not present

## 2020-04-20 DIAGNOSIS — N184 Chronic kidney disease, stage 4 (severe): Secondary | ICD-10-CM | POA: Diagnosis not present

## 2020-04-20 DIAGNOSIS — E1122 Type 2 diabetes mellitus with diabetic chronic kidney disease: Secondary | ICD-10-CM | POA: Diagnosis not present

## 2020-05-21 DIAGNOSIS — S46111A Strain of muscle, fascia and tendon of long head of biceps, right arm, initial encounter: Secondary | ICD-10-CM | POA: Diagnosis not present

## 2020-05-26 DIAGNOSIS — M66811 Spontaneous rupture of other tendons, right shoulder: Secondary | ICD-10-CM | POA: Diagnosis not present

## 2020-05-26 DIAGNOSIS — S66011D Strain of long flexor muscle, fascia and tendon of right thumb at wrist and hand level, subsequent encounter: Secondary | ICD-10-CM | POA: Diagnosis not present

## 2020-06-05 ENCOUNTER — Ambulatory Visit
Admission: RE | Admit: 2020-06-05 | Discharge: 2020-06-05 | Disposition: A | Discharge status: Home / Self Care | Payer: PPO | Attending: Urology | Admitting: Urology

## 2020-06-05 ENCOUNTER — Ambulatory Visit
Admission: RE | Admit: 2020-06-05 | Discharge: 2020-06-05 | Disposition: A | Discharge status: Home / Self Care | Payer: PPO | Source: Ambulatory Visit | Attending: Urology | Admitting: Urology

## 2020-06-05 ENCOUNTER — Other Ambulatory Visit: Payer: Self-pay

## 2020-06-05 DIAGNOSIS — E119 Type 2 diabetes mellitus without complications: Secondary | ICD-10-CM | POA: Diagnosis not present

## 2020-06-05 DIAGNOSIS — N200 Calculus of kidney: Secondary | ICD-10-CM | POA: Insufficient documentation

## 2020-06-05 DIAGNOSIS — I878 Other specified disorders of veins: Secondary | ICD-10-CM | POA: Diagnosis not present

## 2020-06-07 ENCOUNTER — Ambulatory Visit: Payer: PPO | Admitting: Urology

## 2020-06-08 ENCOUNTER — Other Ambulatory Visit: Payer: Self-pay | Admitting: Orthopedic Surgery

## 2020-06-08 ENCOUNTER — Other Ambulatory Visit (HOSPITAL_COMMUNITY): Payer: Self-pay | Admitting: Orthopedic Surgery

## 2020-06-08 DIAGNOSIS — M25311 Other instability, right shoulder: Secondary | ICD-10-CM | POA: Diagnosis not present

## 2020-06-08 DIAGNOSIS — E1122 Type 2 diabetes mellitus with diabetic chronic kidney disease: Secondary | ICD-10-CM | POA: Diagnosis not present

## 2020-06-08 DIAGNOSIS — M79601 Pain in right arm: Secondary | ICD-10-CM

## 2020-06-08 DIAGNOSIS — S46101A Unspecified injury of muscle, fascia and tendon of long head of biceps, right arm, initial encounter: Secondary | ICD-10-CM

## 2020-06-08 DIAGNOSIS — Z794 Long term (current) use of insulin: Secondary | ICD-10-CM | POA: Diagnosis not present

## 2020-06-08 DIAGNOSIS — N184 Chronic kidney disease, stage 4 (severe): Secondary | ICD-10-CM | POA: Diagnosis not present

## 2020-06-20 ENCOUNTER — Ambulatory Visit
Admission: RE | Admit: 2020-06-20 | Discharge: 2020-06-20 | Disposition: A | Discharge status: Home / Self Care | Payer: PPO | Source: Ambulatory Visit | Attending: Orthopedic Surgery | Admitting: Orthopedic Surgery

## 2020-06-20 ENCOUNTER — Other Ambulatory Visit: Payer: Self-pay

## 2020-06-20 DIAGNOSIS — M79601 Pain in right arm: Secondary | ICD-10-CM | POA: Diagnosis not present

## 2020-06-20 DIAGNOSIS — M7551 Bursitis of right shoulder: Secondary | ICD-10-CM | POA: Diagnosis not present

## 2020-06-20 DIAGNOSIS — S46101A Unspecified injury of muscle, fascia and tendon of long head of biceps, right arm, initial encounter: Secondary | ICD-10-CM | POA: Diagnosis not present

## 2020-06-20 DIAGNOSIS — M19011 Primary osteoarthritis, right shoulder: Secondary | ICD-10-CM | POA: Diagnosis not present

## 2020-06-20 DIAGNOSIS — M25311 Other instability, right shoulder: Secondary | ICD-10-CM | POA: Insufficient documentation

## 2020-06-23 DIAGNOSIS — Z0000 Encounter for general adult medical examination without abnormal findings: Secondary | ICD-10-CM | POA: Diagnosis not present

## 2020-06-23 DIAGNOSIS — G4733 Obstructive sleep apnea (adult) (pediatric): Secondary | ICD-10-CM | POA: Diagnosis not present

## 2020-06-23 DIAGNOSIS — I10 Essential (primary) hypertension: Secondary | ICD-10-CM | POA: Diagnosis not present

## 2020-06-23 DIAGNOSIS — N184 Chronic kidney disease, stage 4 (severe): Secondary | ICD-10-CM | POA: Diagnosis not present

## 2020-06-23 DIAGNOSIS — J301 Allergic rhinitis due to pollen: Secondary | ICD-10-CM | POA: Diagnosis not present

## 2020-06-23 DIAGNOSIS — Z6841 Body Mass Index (BMI) 40.0 and over, adult: Secondary | ICD-10-CM | POA: Diagnosis not present

## 2020-06-23 DIAGNOSIS — E7800 Pure hypercholesterolemia, unspecified: Secondary | ICD-10-CM | POA: Diagnosis not present

## 2020-06-23 DIAGNOSIS — Z794 Long term (current) use of insulin: Secondary | ICD-10-CM | POA: Diagnosis not present

## 2020-06-23 DIAGNOSIS — E1140 Type 2 diabetes mellitus with diabetic neuropathy, unspecified: Secondary | ICD-10-CM | POA: Diagnosis not present

## 2020-06-23 DIAGNOSIS — E1122 Type 2 diabetes mellitus with diabetic chronic kidney disease: Secondary | ICD-10-CM | POA: Diagnosis not present

## 2020-06-28 DIAGNOSIS — M25311 Other instability, right shoulder: Secondary | ICD-10-CM | POA: Diagnosis not present

## 2020-07-11 DIAGNOSIS — M25611 Stiffness of right shoulder, not elsewhere classified: Secondary | ICD-10-CM | POA: Diagnosis not present

## 2020-07-11 DIAGNOSIS — M6281 Muscle weakness (generalized): Secondary | ICD-10-CM | POA: Diagnosis not present

## 2020-07-11 DIAGNOSIS — M25511 Pain in right shoulder: Secondary | ICD-10-CM | POA: Diagnosis not present

## 2020-07-18 DIAGNOSIS — M25611 Stiffness of right shoulder, not elsewhere classified: Secondary | ICD-10-CM | POA: Diagnosis not present

## 2020-07-18 DIAGNOSIS — M25511 Pain in right shoulder: Secondary | ICD-10-CM | POA: Diagnosis not present

## 2020-07-18 DIAGNOSIS — M6281 Muscle weakness (generalized): Secondary | ICD-10-CM | POA: Diagnosis not present

## 2020-07-20 DIAGNOSIS — M6281 Muscle weakness (generalized): Secondary | ICD-10-CM | POA: Diagnosis not present

## 2020-07-20 DIAGNOSIS — M25511 Pain in right shoulder: Secondary | ICD-10-CM | POA: Diagnosis not present

## 2020-07-20 DIAGNOSIS — M25611 Stiffness of right shoulder, not elsewhere classified: Secondary | ICD-10-CM | POA: Diagnosis not present

## 2020-07-26 DIAGNOSIS — M6281 Muscle weakness (generalized): Secondary | ICD-10-CM | POA: Diagnosis not present

## 2020-07-26 DIAGNOSIS — N184 Chronic kidney disease, stage 4 (severe): Secondary | ICD-10-CM | POA: Diagnosis not present

## 2020-07-26 DIAGNOSIS — Z794 Long term (current) use of insulin: Secondary | ICD-10-CM | POA: Diagnosis not present

## 2020-07-26 DIAGNOSIS — E1122 Type 2 diabetes mellitus with diabetic chronic kidney disease: Secondary | ICD-10-CM | POA: Diagnosis not present

## 2020-07-26 DIAGNOSIS — M25611 Stiffness of right shoulder, not elsewhere classified: Secondary | ICD-10-CM | POA: Diagnosis not present

## 2020-07-26 DIAGNOSIS — M25511 Pain in right shoulder: Secondary | ICD-10-CM | POA: Diagnosis not present

## 2020-08-01 DIAGNOSIS — D631 Anemia in chronic kidney disease: Secondary | ICD-10-CM | POA: Diagnosis not present

## 2020-08-01 DIAGNOSIS — N184 Chronic kidney disease, stage 4 (severe): Secondary | ICD-10-CM | POA: Diagnosis not present

## 2020-08-01 DIAGNOSIS — E1122 Type 2 diabetes mellitus with diabetic chronic kidney disease: Secondary | ICD-10-CM | POA: Diagnosis not present

## 2020-08-01 DIAGNOSIS — N2581 Secondary hyperparathyroidism of renal origin: Secondary | ICD-10-CM | POA: Diagnosis not present

## 2020-08-01 DIAGNOSIS — I10 Essential (primary) hypertension: Secondary | ICD-10-CM | POA: Diagnosis not present

## 2020-08-02 DIAGNOSIS — I10 Essential (primary) hypertension: Secondary | ICD-10-CM | POA: Diagnosis not present

## 2020-08-02 DIAGNOSIS — E1121 Type 2 diabetes mellitus with diabetic nephropathy: Secondary | ICD-10-CM | POA: Diagnosis not present

## 2020-08-02 DIAGNOSIS — R809 Proteinuria, unspecified: Secondary | ICD-10-CM | POA: Diagnosis not present

## 2020-08-02 DIAGNOSIS — E1129 Type 2 diabetes mellitus with other diabetic kidney complication: Secondary | ICD-10-CM | POA: Diagnosis not present

## 2020-08-02 DIAGNOSIS — E1169 Type 2 diabetes mellitus with other specified complication: Secondary | ICD-10-CM | POA: Diagnosis not present

## 2020-08-02 DIAGNOSIS — E1142 Type 2 diabetes mellitus with diabetic polyneuropathy: Secondary | ICD-10-CM | POA: Diagnosis not present

## 2020-08-02 DIAGNOSIS — Z794 Long term (current) use of insulin: Secondary | ICD-10-CM | POA: Diagnosis not present

## 2020-08-02 DIAGNOSIS — N1832 Chronic kidney disease, stage 3b: Secondary | ICD-10-CM | POA: Diagnosis not present

## 2020-08-02 DIAGNOSIS — E1122 Type 2 diabetes mellitus with diabetic chronic kidney disease: Secondary | ICD-10-CM | POA: Diagnosis not present

## 2020-08-02 DIAGNOSIS — E669 Obesity, unspecified: Secondary | ICD-10-CM | POA: Diagnosis not present

## 2020-08-10 DIAGNOSIS — M25311 Other instability, right shoulder: Secondary | ICD-10-CM | POA: Diagnosis not present

## 2020-08-15 DIAGNOSIS — Z794 Long term (current) use of insulin: Secondary | ICD-10-CM | POA: Diagnosis not present

## 2020-08-15 DIAGNOSIS — B351 Tinea unguium: Secondary | ICD-10-CM | POA: Diagnosis not present

## 2020-08-15 DIAGNOSIS — E1140 Type 2 diabetes mellitus with diabetic neuropathy, unspecified: Secondary | ICD-10-CM | POA: Diagnosis not present

## 2020-08-25 ENCOUNTER — Other Ambulatory Visit: Payer: Self-pay | Admitting: Orthopedic Surgery

## 2020-09-04 ENCOUNTER — Other Ambulatory Visit
Admission: RE | Admit: 2020-09-04 | Discharge: 2020-09-04 | Disposition: A | Discharge status: Home / Self Care | Payer: PPO | Source: Ambulatory Visit | Attending: Orthopedic Surgery | Admitting: Orthopedic Surgery

## 2020-09-04 DIAGNOSIS — Z01812 Encounter for preprocedural laboratory examination: Secondary | ICD-10-CM | POA: Insufficient documentation

## 2020-09-04 HISTORY — DX: Personal history of urinary calculi: Z87.442

## 2020-09-04 HISTORY — DX: Unspecified asthma, uncomplicated: J45.909

## 2020-09-04 NOTE — Patient Instructions (Addendum)
Your procedure is scheduled on: 02- 21-22 -  MONDAY Report to the Registration Desk on the 1st floor of the [facility]. To find out your arrival time, please call [phone] between 1PM - 3PM on: 09/08/20- FRIDAY  REMEMBER: Instructions that are not followed completely may result in serious medical risk, up to and including death; or upon the discretion of your surgeon and anesthesiologist your surgery may need to be rescheduled.  Do not eat food after midnight the night before surgery.  No gum chewing, lozengers or hard candies.  TYPE 1 AND TYPE 2 DIABETICS MAY ONLY DRINK WATER  In addition, your doctor has ordered for you to drink the provided  Gatorade G2- 12 OZ Drinking this carbohydrate drink up to two hours before surgery helps to reduce insulin resistance and improve patient outcomes. Please complete drinking 2 hours prior to scheduled arrival time.  TAKE THESE MEDICATIONS THE MORNING OF SURGERY WITH A SIP OF WATER: - metoprolol succinate (TOPROL-XL) 25 MG 24 hr tablet  Use inhaler albuterol (PROVENTIL HFA;VENTOLIN HFA) 108 (90 Base) MCG/ACT on the day of surgery and bring to the hospital.  Take 1/2 of usual insulin detemir (LEVEMIR) 100 UNIT/ML injection dose the night before surgery TAKE NO  on insulin aspart (NOVOLOG) 100 UNIT/ML injection The morning of surgery.   One week prior to surgery: Stop Anti-inflammatories (NSAIDS) such as Advil, Aleve, Ibuprofen, Motrin, Naproxen, Naprosyn and Aspirin based products such as Excedrin, Goodys Powder, BC Powder.  Stop ANY OVER THE COUNTER supplements until after surgery. (However, you may continue taking Vitamin D, Vitamin B, and multivitamin up until the day before surgery.)  No Alcohol for 24 hours before or after surgery.  No Smoking including e-cigarettes for 24 hours prior to surgery.  No chewable tobacco products for at least 6 hours prior to surgery.  No nicotine patches on the day of surgery.  Do not use any  "recreational" drugs for at least a week prior to your surgery.  Please be advised that the combination of cocaine and anesthesia may have negative outcomes, up to and including death. If you test positive for cocaine, your surgery will be cancelled.  On the morning of surgery brush your teeth with toothpaste and water, you may rinse your mouth with mouthwash if you wish. Do not swallow any toothpaste or mouthwash.  Do not wear jewelry, make-up, hairpins, clips or nail polish.  Do not wear lotions, powders, or perfumes.   Do not shave body from the neck down 48 hours prior to surgery just in case you cut yourself which could leave a site for infection.  Also, freshly shaved skin may become irritated if using the CHG soap.  Contact lenses, hearing aids and dentures may not be worn into surgery.  Do not bring valuables to the hospital. [facility] is not responsible for any missing/lost belongings or valuables.   Use CHG Soap or wipes as directed on instruction sheet.  Bring your C-PAP to the hospital with you in case you may have to spend the night.   Notify your doctor if there is any change in your medical condition (cold, fever, infection).  Wear comfortable clothing (specific to your surgery type) to the hospital.  Plan for stool softeners for home use; pain medications have a tendency to cause constipation. You can also help prevent constipation by eating foods high in fiber such as fruits and vegetables and drinking plenty of fluids as your diet allows.  After surgery, you can  help prevent lung complications by doing breathing exercises.  Take deep breaths and cough every 1-2 hours. Your doctor may order a device called an Incentive Spirometer to help you take deep breaths. When coughing or sneezing, hold a pillow firmly against your incision with both hands. This is called "splinting." Doing this helps protect your incision. It also decreases belly discomfort.  If you are being  admitted to the hospital overnight, leave your suitcase in the car. After surgery it may be brought to your room.  If you are being discharged the day of surgery, you will not be allowed to drive home. You will need a responsible adult (18 years or older) to drive you home and stay with you that night.   If you are taking public transportation, you will need to have a responsible adult (18 years or older) with you. Please confirm with your physician that it is acceptable to use public transportation.   Please call the [facility] at [phone] if you have any questions about these instructions.  Visitation Policy:  Patients undergoing a surgery or procedure may have one family member or support person with them as long as that person is not COVID-19 positive or experiencing its symptoms.  That person may remain in the waiting area during the procedure.  Inpatient Visitation:    Visiting hours are 7 a.m. to 8 p.m. Patients will be allowed one visitor. The visitor may change daily. The visitor must pass COVID-19 screenings, use hand sanitizer when entering and exiting the patient's room and wear a mask at all times, including in the patient's room. Patients must also wear a mask when staff or their visitor are in the room. Masking is required regardless of vaccination status. Systemwide, no visitors 17 or younger.Your procedure is scheduled on: Report to the Registration Desk on the 1st floor of the [facility]. To find out your arrival time, please call [phone] between 1PM - 3PM on:

## 2020-09-07 DIAGNOSIS — M25311 Other instability, right shoulder: Secondary | ICD-10-CM | POA: Diagnosis not present

## 2020-09-08 ENCOUNTER — Other Ambulatory Visit: Payer: Self-pay

## 2020-09-08 ENCOUNTER — Encounter
Admission: RE | Admit: 2020-09-08 | Discharge: 2020-09-08 | Disposition: A | Discharge status: Home / Self Care | Payer: PPO | Source: Ambulatory Visit | Attending: Orthopedic Surgery | Admitting: Orthopedic Surgery

## 2020-09-08 DIAGNOSIS — Z20822 Contact with and (suspected) exposure to covid-19: Secondary | ICD-10-CM | POA: Insufficient documentation

## 2020-09-08 DIAGNOSIS — Z01812 Encounter for preprocedural laboratory examination: Secondary | ICD-10-CM | POA: Diagnosis present

## 2020-09-08 LAB — SARS CORONAVIRUS 2 (TAT 6-24 HRS): SARS Coronavirus 2: NEGATIVE

## 2020-09-08 LAB — PROTIME-INR
INR: 1 (ref 0.8–1.2)
Prothrombin Time: 13.2 seconds (ref 11.4–15.2)

## 2020-09-08 LAB — APTT: aPTT: 31 seconds (ref 24–36)

## 2020-09-10 MED ORDER — AMLODIPINE BESYLATE 5 MG PO TABS
5.0000 mg | ORAL_TABLET | Freq: Every day | ORAL | Status: DC
  Administered 2020-09-11: 5 mg via ORAL
  Filled 2020-09-10: qty 1

## 2020-09-10 MED ORDER — DEXTROSE 5 % IV SOLN
3.0000 g | INTRAVENOUS | Status: AC
  Administered 2020-09-11: 2 g via INTRAVENOUS
  Filled 2020-09-10: qty 3

## 2020-09-10 MED ORDER — SODIUM CHLORIDE 0.9 % IV SOLN: INTRAVENOUS | Status: DC

## 2020-09-10 MED ORDER — CHLORHEXIDINE GLUCONATE 0.12 % MT SOLN: 15.0000 mL | Freq: Once | OROMUCOSAL | Status: AC

## 2020-09-10 MED ORDER — FAMOTIDINE 20 MG PO TABS: 20.0000 mg | ORAL_TABLET | Freq: Once | ORAL | Status: AC

## 2020-09-10 MED ORDER — ORAL CARE MOUTH RINSE: 15.0000 mL | Freq: Once | OROMUCOSAL | Status: AC

## 2020-09-11 ENCOUNTER — Ambulatory Visit
Admission: RE | Admit: 2020-09-11 | Discharge: 2020-09-11 | Disposition: A | Discharge status: Home / Self Care | Payer: PPO | Attending: Orthopedic Surgery | Admitting: Orthopedic Surgery

## 2020-09-11 ENCOUNTER — Encounter
Admission: RE | Disposition: A | Discharge status: Home / Self Care | Payer: Self-pay | Source: Home / Self Care | Attending: Orthopedic Surgery

## 2020-09-11 ENCOUNTER — Ambulatory Visit: Payer: PPO

## 2020-09-11 ENCOUNTER — Ambulatory Visit: Payer: PPO | Admitting: Anesthesiology

## 2020-09-11 ENCOUNTER — Encounter: Payer: Self-pay | Admitting: Orthopedic Surgery

## 2020-09-11 ENCOUNTER — Other Ambulatory Visit: Payer: Self-pay

## 2020-09-11 DIAGNOSIS — E1122 Type 2 diabetes mellitus with diabetic chronic kidney disease: Secondary | ICD-10-CM | POA: Diagnosis not present

## 2020-09-11 DIAGNOSIS — M25311 Other instability, right shoulder: Secondary | ICD-10-CM | POA: Diagnosis not present

## 2020-09-11 DIAGNOSIS — Z881 Allergy status to other antibiotic agents status: Secondary | ICD-10-CM | POA: Insufficient documentation

## 2020-09-11 DIAGNOSIS — G4730 Sleep apnea, unspecified: Secondary | ICD-10-CM | POA: Diagnosis not present

## 2020-09-11 DIAGNOSIS — M7521 Bicipital tendinitis, right shoulder: Secondary | ICD-10-CM | POA: Diagnosis not present

## 2020-09-11 DIAGNOSIS — M7581 Other shoulder lesions, right shoulder: Secondary | ICD-10-CM | POA: Diagnosis not present

## 2020-09-11 DIAGNOSIS — E119 Type 2 diabetes mellitus without complications: Secondary | ICD-10-CM | POA: Diagnosis not present

## 2020-09-11 DIAGNOSIS — N183 Chronic kidney disease, stage 3 unspecified: Secondary | ICD-10-CM | POA: Diagnosis not present

## 2020-09-11 DIAGNOSIS — M75101 Unspecified rotator cuff tear or rupture of right shoulder, not specified as traumatic: Secondary | ICD-10-CM | POA: Diagnosis not present

## 2020-09-11 DIAGNOSIS — X509XXA Other and unspecified overexertion or strenuous movements or postures, initial encounter: Secondary | ICD-10-CM | POA: Diagnosis not present

## 2020-09-11 DIAGNOSIS — S46011A Strain of muscle(s) and tendon(s) of the rotator cuff of right shoulder, initial encounter: Secondary | ICD-10-CM | POA: Insufficient documentation

## 2020-09-11 DIAGNOSIS — Z885 Allergy status to narcotic agent status: Secondary | ICD-10-CM | POA: Diagnosis not present

## 2020-09-11 DIAGNOSIS — E785 Hyperlipidemia, unspecified: Secondary | ICD-10-CM | POA: Diagnosis not present

## 2020-09-11 DIAGNOSIS — Z794 Long term (current) use of insulin: Secondary | ICD-10-CM | POA: Diagnosis not present

## 2020-09-11 DIAGNOSIS — M65811 Other synovitis and tenosynovitis, right shoulder: Secondary | ICD-10-CM | POA: Insufficient documentation

## 2020-09-11 DIAGNOSIS — Z419 Encounter for procedure for purposes other than remedying health state, unspecified: Secondary | ICD-10-CM

## 2020-09-11 DIAGNOSIS — G8918 Other acute postprocedural pain: Secondary | ICD-10-CM | POA: Diagnosis not present

## 2020-09-11 DIAGNOSIS — M7541 Impingement syndrome of right shoulder: Secondary | ICD-10-CM | POA: Insufficient documentation

## 2020-09-11 DIAGNOSIS — Z79899 Other long term (current) drug therapy: Secondary | ICD-10-CM | POA: Diagnosis not present

## 2020-09-11 DIAGNOSIS — M75121 Complete rotator cuff tear or rupture of right shoulder, not specified as traumatic: Secondary | ICD-10-CM | POA: Diagnosis not present

## 2020-09-11 DIAGNOSIS — M25511 Pain in right shoulder: Secondary | ICD-10-CM | POA: Diagnosis not present

## 2020-09-11 HISTORY — PX: SHOULDER ARTHROSCOPY WITH SUBACROMIAL DECOMPRESSION AND OPEN ROTATOR C: SHX5688

## 2020-09-11 LAB — POCT I-STAT, CHEM 8
BUN: 57 mg/dL — ABNORMAL HIGH (ref 8–23)
Calcium, Ion: 1.19 mmol/L (ref 1.15–1.40)
Chloride: 97 mmol/L — ABNORMAL LOW (ref 98–111)
Creatinine, Ser: 2.4 mg/dL — ABNORMAL HIGH (ref 0.61–1.24)
Glucose, Bld: 234 mg/dL — ABNORMAL HIGH (ref 70–99)
HCT: 34 % — ABNORMAL LOW (ref 39.0–52.0)
Hemoglobin: 11.6 g/dL — ABNORMAL LOW (ref 13.0–17.0)
Potassium: 3.6 mmol/L (ref 3.5–5.1)
Sodium: 137 mmol/L (ref 135–145)
TCO2: 27 mmol/L (ref 22–32)

## 2020-09-11 LAB — GLUCOSE, CAPILLARY
Glucose-Capillary: 218 mg/dL — ABNORMAL HIGH (ref 70–99)
Glucose-Capillary: 225 mg/dL — ABNORMAL HIGH (ref 70–99)

## 2020-09-11 SURGERY — SHOULDER ARTHROSCOPY WITH SUBACROMIAL DECOMPRESSION AND OPEN ROTATOR CUFF REPAIR, OPEN BICEPS TENDON REPAIR
Anesthesia: General | Laterality: Right

## 2020-09-11 MED ORDER — SUCCINYLCHOLINE CHLORIDE 20 MG/ML IJ SOLN
INTRAMUSCULAR | Status: DC | PRN
  Administered 2020-09-11: 140 mg via INTRAVENOUS

## 2020-09-11 MED ORDER — PROPOFOL 10 MG/ML IV BOLUS
INTRAVENOUS | Status: AC
  Filled 2020-09-11: qty 20

## 2020-09-11 MED ORDER — LIDOCAINE HCL (PF) 1 % IJ SOLN
INTRAMUSCULAR | Status: AC
  Filled 2020-09-11: qty 5

## 2020-09-11 MED ORDER — INSULIN ASPART 100 UNIT/ML ~~LOC~~ SOLN
3.0000 [IU] | Freq: Once | SUBCUTANEOUS | Status: AC
  Administered 2020-09-11: 3 [IU] via SUBCUTANEOUS

## 2020-09-11 MED ORDER — PHENYLEPHRINE HCL (PRESSORS) 10 MG/ML IV SOLN
INTRAVENOUS | Status: DC | PRN
  Administered 2020-09-11 (×2): 200 ug via INTRAVENOUS

## 2020-09-11 MED ORDER — ASPIRIN EC 325 MG PO TBEC: 325.0000 mg | DELAYED_RELEASE_TABLET | Freq: Every day | ORAL | 0 refills | Status: AC

## 2020-09-11 MED ORDER — FENTANYL CITRATE (PF) 100 MCG/2ML IJ SOLN
25.0000 ug | INTRAMUSCULAR | Status: DC | PRN
Start: 2020-09-11 — End: 2020-09-11

## 2020-09-11 MED ORDER — FAMOTIDINE 20 MG PO TABS
ORAL_TABLET | ORAL | Status: AC
  Administered 2020-09-11: 20 mg via ORAL
  Filled 2020-09-11: qty 1

## 2020-09-11 MED ORDER — INSULIN ASPART 100 UNIT/ML ~~LOC~~ SOLN
SUBCUTANEOUS | Status: AC
  Filled 2020-09-11: qty 1

## 2020-09-11 MED ORDER — TRAMADOL HCL 50 MG PO TABS: 50.0000 mg | ORAL_TABLET | Freq: Four times a day (QID) | ORAL | 0 refills | Status: DC | PRN

## 2020-09-11 MED ORDER — FENTANYL CITRATE (PF) 100 MCG/2ML IJ SOLN
INTRAMUSCULAR | Status: AC
  Administered 2020-09-11: 50 ug via INTRAVENOUS
  Filled 2020-09-11: qty 2

## 2020-09-11 MED ORDER — OXYCODONE HCL 5 MG PO TABS: 5.0000 mg | ORAL_TABLET | ORAL | 0 refills | Status: DC | PRN

## 2020-09-11 MED ORDER — DEXAMETHASONE SODIUM PHOSPHATE 10 MG/ML IJ SOLN
INTRAMUSCULAR | Status: DC | PRN
  Administered 2020-09-11: 10 mg via INTRAVENOUS

## 2020-09-11 MED ORDER — ONDANSETRON HCL 4 MG/2ML IJ SOLN
INTRAMUSCULAR | Status: DC | PRN
  Administered 2020-09-11: 4 mg via INTRAVENOUS

## 2020-09-11 MED ORDER — LACTATED RINGERS IV SOLN: INTRAVENOUS | Status: DC | PRN

## 2020-09-11 MED ORDER — BUPIVACAINE LIPOSOME 1.3 % IJ SUSP
INTRAMUSCULAR | Status: DC | PRN
  Administered 2020-09-11: 20 mL

## 2020-09-11 MED ORDER — BUPIVACAINE HCL (PF) 0.5 % IJ SOLN
INTRAMUSCULAR | Status: AC
  Filled 2020-09-11: qty 10

## 2020-09-11 MED ORDER — FENTANYL CITRATE (PF) 100 MCG/2ML IJ SOLN
INTRAMUSCULAR | Status: AC
  Filled 2020-09-11: qty 2

## 2020-09-11 MED ORDER — SODIUM CHLORIDE 0.9 % IV SOLN
INTRAVENOUS | Status: DC | PRN
  Administered 2020-09-11: 25 ug/min via INTRAVENOUS

## 2020-09-11 MED ORDER — PROPOFOL 10 MG/ML IV BOLUS
INTRAVENOUS | Status: DC | PRN
  Administered 2020-09-11: 160 mg via INTRAVENOUS

## 2020-09-11 MED ORDER — PHENYLEPHRINE HCL-NACL 20-0.9 MG/250ML-% IV SOLN: INTRAVENOUS | Status: DC | PRN

## 2020-09-11 MED ORDER — MIDAZOLAM HCL 2 MG/2ML IJ SOLN
INTRAMUSCULAR | Status: AC
  Filled 2020-09-11: qty 2

## 2020-09-11 MED ORDER — BUPIVACAINE HCL (PF) 0.5 % IJ SOLN
INTRAMUSCULAR | Status: DC | PRN
  Administered 2020-09-11: 10 mL

## 2020-09-11 MED ORDER — ROCURONIUM BROMIDE 100 MG/10ML IV SOLN
INTRAVENOUS | Status: DC | PRN
  Administered 2020-09-11: 10 mg via INTRAVENOUS
  Administered 2020-09-11: 30 mg via INTRAVENOUS
  Administered 2020-09-11: 20 mg via INTRAVENOUS

## 2020-09-11 MED ORDER — CHLORHEXIDINE GLUCONATE 0.12 % MT SOLN
OROMUCOSAL | Status: AC
  Administered 2020-09-11: 15 mL via OROMUCOSAL
  Filled 2020-09-11: qty 15

## 2020-09-11 MED ORDER — FENTANYL CITRATE (PF) 100 MCG/2ML IJ SOLN: 50.0000 ug | INTRAMUSCULAR | Status: DC | PRN

## 2020-09-11 MED ORDER — SUGAMMADEX SODIUM 200 MG/2ML IV SOLN
INTRAVENOUS | Status: DC | PRN
  Administered 2020-09-11: 200 mg via INTRAVENOUS

## 2020-09-11 MED ORDER — BUPIVACAINE LIPOSOME 1.3 % IJ SUSP
INTRAMUSCULAR | Status: AC
  Filled 2020-09-11: qty 20

## 2020-09-11 MED ORDER — ACETAMINOPHEN 500 MG PO TABS: 1000.0000 mg | ORAL_TABLET | Freq: Three times a day (TID) | ORAL | 2 refills | Status: AC

## 2020-09-11 MED ORDER — ONDANSETRON 4 MG PO TBDP: 4.0000 mg | ORAL_TABLET | Freq: Three times a day (TID) | ORAL | 0 refills | Status: DC | PRN

## 2020-09-11 SURGICAL SUPPLY — 80 items
ADAPTER IRRIG TUBE 2 SPIKE SOL (ADAPTER) ×4 IMPLANT
ADH SKN CLS APL DERMABOND .7 (GAUZE/BANDAGES/DRESSINGS)
ADPR TBG 2 SPK PMP STRL ASCP (ADAPTER) ×2
ANCH SUT 2 SWLK 19.1 CLS EYLT (Anchor) ×2 IMPLANT
ANCH SUT 2X2.3 TAPE (Anchor) ×2 IMPLANT
ANCH SUT SHRT 12.5 CANN EYLT (Anchor) ×1 IMPLANT
ANCHOR 2.3 SP SGL 1.2 XBRAID (Anchor) ×4 IMPLANT
ANCHOR SUT BIOCOMP LK 2.9X12.5 (Anchor) ×2 IMPLANT
ANCHOR SWIVELOCK BIO 4.75X19.1 (Anchor) ×4 IMPLANT
APL PRP STRL LF DISP 70% ISPRP (MISCELLANEOUS) ×1
BNDG ADH 2 X3.75 FABRIC TAN LF (GAUZE/BANDAGES/DRESSINGS) ×2 IMPLANT
BNDG ADH XL 3.75X2 STRCH LF (GAUZE/BANDAGES/DRESSINGS) ×1
BUR BR 5.5 12 FLUTE (BURR) ×2 IMPLANT
BUR RADIUS 4.0X18.5 (BURR) ×2 IMPLANT
CANNULA PART THRD DISP 5.75X7 (CANNULA) IMPLANT
CANNULA PARTIAL THREAD 2X7 (CANNULA) IMPLANT
CANNULA TWIST IN 8.25X9CM (CANNULA) IMPLANT
CHLORAPREP W/TINT 26 (MISCELLANEOUS) ×2 IMPLANT
COOLER POLAR GLACIER W/PUMP (MISCELLANEOUS) ×2 IMPLANT
COVER WAND RF STERILE (DRAPES) ×2 IMPLANT
DERMABOND ADVANCED (GAUZE/BANDAGES/DRESSINGS)
DERMABOND ADVANCED .7 DNX12 (GAUZE/BANDAGES/DRESSINGS) IMPLANT
DRAPE 3/4 80X56 (DRAPES) ×2 IMPLANT
DRAPE IMP U-DRAPE 54X76 (DRAPES) ×4 IMPLANT
DRAPE INCISE IOBAN 66X45 STRL (DRAPES) ×2 IMPLANT
DRAPE U-SHAPE 47X51 STRL (DRAPES) ×4 IMPLANT
DRSG TEGADERM 4X4.75 (GAUZE/BANDAGES/DRESSINGS) ×6 IMPLANT
ELECT REM PT RETURN 9FT ADLT (ELECTROSURGICAL) ×2
ELECTRODE REM PT RTRN 9FT ADLT (ELECTROSURGICAL) ×1 IMPLANT
GAUZE SPONGE 4X4 12PLY STRL (GAUZE/BANDAGES/DRESSINGS) ×2 IMPLANT
GAUZE XEROFORM 1X8 LF (GAUZE/BANDAGES/DRESSINGS) ×2 IMPLANT
GLOVE SRG 8 PF TXTR STRL LF DI (GLOVE) ×2 IMPLANT
GLOVE SURG ENC MOIS LTX SZ7.5 (GLOVE) ×2 IMPLANT
GLOVE SURG ORTHO LTX SZ8 (GLOVE) ×2 IMPLANT
GLOVE SURG SYN 8.0 (GLOVE) ×2 IMPLANT
GLOVE SURG UNDER POLY LF SZ8 (GLOVE) ×4
GOWN STRL REUS W/ TWL LRG LVL3 (GOWN DISPOSABLE) ×2 IMPLANT
GOWN STRL REUS W/TWL LRG LVL3 (GOWN DISPOSABLE) ×4
GOWN STRL REUS W/TWL XL LVL4 (GOWN DISPOSABLE) ×2 IMPLANT
IV LACTATED RINGER IRRG 3000ML (IV SOLUTION) ×14
IV LR IRRIG 3000ML ARTHROMATIC (IV SOLUTION) ×7 IMPLANT
KIT CORKSCREW KNTLS 3.9 S/T/P (INSTRUMENTS) IMPLANT
KIT INSERTION 2.9 PUSHLOCK (KITS) ×2 IMPLANT
KIT STABILIZATION SHOULDER (MISCELLANEOUS) ×2 IMPLANT
KIT SUTURETAK 3.0 INSERT PERC (KITS) IMPLANT
KIT TURNOVER KIT A (KITS) ×2 IMPLANT
MANIFOLD NEPTUNE II (INSTRUMENTS) ×4 IMPLANT
MASK FACE SPIDER DISP (MASK) ×2 IMPLANT
MAT ABSORB  FLUID 56X50 GRAY (MISCELLANEOUS) ×2
MAT ABSORB FLUID 56X50 GRAY (MISCELLANEOUS) ×2 IMPLANT
NDL MAYO CATGUT SZ5 (NEEDLE)
NDL SAFETY ECLIPSE 18X1.5 (NEEDLE) ×1 IMPLANT
NDL SUT 5 .5 CRC TPR PNT MAYO (NEEDLE) IMPLANT
NEEDLE HYPO 18GX1.5 SHARP (NEEDLE) ×2
NEEDLE SCORPION MULTI FIRE (NEEDLE) IMPLANT
PACK ARTHROSCOPY SHOULDER (MISCELLANEOUS) ×2 IMPLANT
PAD ARMBOARD 7.5X6 YLW CONV (MISCELLANEOUS) ×2 IMPLANT
PAD WRAPON POLAR SHDR XLG (MISCELLANEOUS) ×1 IMPLANT
PASSER SUT FIRSTPASS SELF (INSTRUMENTS) ×2 IMPLANT
PENCIL SMOKE EVACUATOR (MISCELLANEOUS) ×2 IMPLANT
SET TUBE SUCT SHAVER OUTFL 24K (TUBING) ×2 IMPLANT
SET TUBE TIP INTRA-ARTICULAR (MISCELLANEOUS) ×2 IMPLANT
SLING ULTRA II M (MISCELLANEOUS) ×2 IMPLANT
STAPLER SKIN PROX 35W (STAPLE) IMPLANT
STRAP SAFETY 5IN WIDE (MISCELLANEOUS) ×2 IMPLANT
SUT ETHILON 3-0 (SUTURE) ×2 IMPLANT
SUT LASSO 90 DEG CVD (SUTURE) IMPLANT
SUT LASSO 90 DEG SD STR (SUTURE) IMPLANT
SUT MNCRL 4-0 (SUTURE)
SUT MNCRL 4-0 27XMFL (SUTURE)
SUT PROLENE 0 CT 2 (SUTURE) IMPLANT
SUT VIC AB 0 CT1 36 (SUTURE) IMPLANT
SUT VIC AB 2-0 CT2 27 (SUTURE) IMPLANT
SUTURE MNCRL 4-0 27XMF (SUTURE) IMPLANT
TAPE CLOTH 3X10 WHT NS LF (GAUZE/BANDAGES/DRESSINGS) ×2 IMPLANT
TAPE MICROFOAM 4IN (TAPE) ×2 IMPLANT
TUBING ARTHRO INFLOW-ONLY STRL (TUBING) ×2 IMPLANT
TUBING CONNECTING 10 (TUBING) IMPLANT
WAND WEREWOLF FLOW 90D (MISCELLANEOUS) ×2 IMPLANT
WRAPON POLAR PAD SHDR XLG (MISCELLANEOUS) ×2

## 2020-09-11 NOTE — Transfer of Care (Signed)
Immediate Anesthesia Transfer of Care Note  Patient: James Clarke  Procedure(s) Performed: Right shoulder arthroscopic rotator cuff repair vs Regeneten patch application, subacromial decompression, and biceps tenodesis - Reche Dixon to Assist (Right )  Patient Location: PACU  Anesthesia Type:General  Level of Consciousness: awake and oriented  Airway & Oxygen Therapy: Patient Spontanous Breathing and Patient connected to face mask oxygen  Post-op Assessment: Report given to RN and Post -op Vital signs reviewed and stable  Post vital signs: Reviewed and stable  Last Vitals:  Vitals Value Taken Time  BP 138/59 09/11/20 1025  Temp 36.3 C 09/11/20 1027  Pulse 66 09/11/20 1027  Resp 16 09/11/20 1027  SpO2 100 % 09/11/20 1027  Vitals shown include unvalidated device data.  Last Pain:  Vitals:   09/11/20 0701  TempSrc: Temporal  PainSc: 0-No pain         Complications: No complications documented.

## 2020-09-11 NOTE — Op Note (Signed)
SURGERY DATE: 09/11/2020   PRE-OP DIAGNOSIS:  1. Right subacromial impingement 2. Right biceps tendinopathy 3. Right rotator cuff tear   POST-OP DIAGNOSIS: 1. Right subacromial impingement 2. Right biceps tendinopathy 3. Right rotator cuff tear   PROCEDURES:  1. Right arthroscopic rotator cuff repair 2. Right arthroscopic biceps tenodesis 3. Right arthroscopic subacromial decompression 4. Right arthroscopic extensive debridement of shoulder (glenohumeral and subacromial spaces)   SURGEON: Cato Mulligan, MD   ASSISTANT:  ASSISTANT: James Lauth, PA; James Cons, PA-S   ANESTHESIA: Gen with Exparil interscalene block   ESTIMATED BLOOD LOSS: 5cc   DRAINS:  none   TOTAL IV FLUIDS: per anesthesia      SPECIMENS: none   IMPLANTS:  - Arthrex 2.37mm PushLock x 1 - Arthrex 4.64mm SwiveLock x 2 - Iconix SPEED double loaded with 1.2 and 2.50mm tape x 2     OPERATIVE FINDINGS:  Examination under anesthesia: A careful examination under anesthesia was performed.  Passive range of motion was: FF: 150; ER at side: 45; ER in abduction: 90; IR in abduction: 45.  Anterior load shift: NT.  Posterior load shift: NT.  Sulcus in neutral: NT.  Sulcus in ER: NT.     Intra-operative findings: A thorough arthroscopic examination of the shoulder was performed.  The findings are: 1. Biceps tendon: tendinopathy with significant erythema  2. Superior labrum: erythema and degenerative 3. Posterior labrum and capsule: normal 4. Inferior capsule and inferior recess: normal 5. Glenoid cartilage surface: Grade 1 degenerative changes 6. Supraspinatus attachment: full-thickness tear of the supraspinatus 7. Posterior rotator cuff attachment: normal 8. Humeral head articular cartilage: few areas of Grade 1-2 degenerative changes 9. Rotator interval: significant synovitis 10: Subscapularis tendon: attachment intact 11. Anterior labrum: Mildly degenerative 12. IGHL: normal   OPERATIVE REPORT:     Indications for procedure:  James Clarke is a 80 y.o. male with approximately 4 months of right shoulder pain after he fell and pulled himself up using his arms.  He has had difficulty with overhead motion since that time with significant pain and sensations of weakness. He had undergone a course of nonoperative management consisting of medical management, activity modifications, physical therapy, and corticosteroid injection without improvement in symptoms. Clinical exam and MRI were suggestive of rotator cuff tear, biceps tendinopathy, and subacromial impingement. After discussion of risks, benefits, and alternatives to surgery, the patient elected to proceed.    Procedure in detail:   I identified James Clarke in the pre-operative holding area.  I marked the operative shoulder with my initials. I reviewed the risks and benefits of the proposed surgical intervention, and the patient wished to proceed.  Anesthesia was then performed with an Exparil interscalene block.  The patient was transferred to the operative suite and placed in the beach chair position.     Appropriate IV antibiotics were administered prior to incision. The operative upper extremity was then prepped and draped in standard fashion. A time out was performed confirming the correct extremity, correct patient, and correct procedure.    I then created a standard posterior portal with an 11 blade. The glenohumeral joint was easily entered with a blunt trocar and the arthroscope introduced. The findings of diagnostic arthroscopy are described above. I debrided degenerative tissue including the synovitic tissue about the rotator interval and anterior and superior labrum. I then coagulated the inflamed synovium to obtain hemostasis and reduce the risk of post-operative swelling using an Arthrocare radiofrequency device.   I then turned my  attention to the arthroscopic biceps tenodesis.  I used the Loop n Tack technique to pass a  FiberTape through the biceps in a locked fashion adjacent to the biceps anchor.  A hole for a 2.9 mm Arthrex PushLock was drilled in the bicipital groove just superior to the subscapularis tendon insertion.  The biceps tendon was then cut.  The FiberTape was loaded onto the PushLock anchor and impacted into place into the previously drilled hole in the bicipital groove.  This appropriately secured the biceps into the bicipital groove and took it off of tension.   Next, the arthroscope was then introduced into the subacromial space. A direct lateral portal was created with an 11-blade after spinal needle localization. An extensive subacromial bursectomy was performed using a combination of the shaver and Arthrocare wand. The entire acromial undersurface was exposed and the CA ligament was subperiosteally elevated to expose the anterior acromial hook. A burr was used to create a flat anterior and lateral aspect of the acromion, converting it from a Type 2 to a Type 1 acromion. Care was made to keep the deltoid fascia intact.   Next I created an accessory posterolateral portal to assist with visualization and instrumentation.  I could easily pass a switching stick through the torn area of the rotator cuff suggesting full-thickness tear. I debrided the poor quality edges of the supraspinatus tendon. This tear involved the entire supraspinatus. This was a U-shaped tear of the supraspinatus.  I prepared the footprint using a burr to expose bleeding bone.    I then percutaneously placed 1 Iconix SPEED medial row anchor along the anterior portion of the tear at the articular margin. Another SPEED anchor was placed along the posterior portion of the tear at the articular margin. I then shuttled all 8 strands of tape through the rotator cuff just lateral to the musculotendinous junction using a FirstPass suture passer spanning the anterior to posterior extent of the tear. The posterior strands of each suture were passed  through an [employer] anchor.  This was placed approximately 2 cm distal to the lateral edge of the footprint in line with the posterior aspect of the tear with appropriate tensioning of each suture prior to final fixation.  Similarly, the anterior strands of each suture were passed through another SwiveLock anchor along the anterior margin of the tear.  There was 1 small dogear posteriorly.  The repair suture from the posterior lateral row anchor was passed through the dog ear and tensioned via the knotless mechanism of the anchor.  This construct allowed for excellent reapproximation of the rotator cuff to its native footprint without undue tension.  Appropriate compression was achieved.  The repair was stable to external and internal rotation.   Fluid was evacuated from the shoulder, and the portals were closed with 3-0 Nylon. Xeroform was applied to the portals. A sterile dressing was applied, followed by a Polar Care sleeve and a SlingShot shoulder immobilizer/sling. The patient was awakened from anesthesia without difficulty and was transferred to the PACU in stable condition.    Of note, assistance from a PA was essential to performing the surgery.  PA was present for the entire surgery.  PA assisted with patient positioning, retraction, instrumentation, and wound closure. The surgery would have been more difficult and had longer operative time without PA assistance.   COMPLICATIONS: none   DISPOSITION: plan for discharge home after recovery in PACU     POSTOPERATIVE PLAN: Remain in sling (except hygiene and elbow/wrist/hand  RoM exercises as instructed by PT) x 6 weeks and NWB for this time. PT to begin 3-4 days after surgery.  Large rotator cuff repair rehab protocol. ASA 325mg  daily x 2 weeks for DVT ppx.

## 2020-09-11 NOTE — Anesthesia Procedure Notes (Signed)
Performed by: Lash Matulich E, CRNA       

## 2020-09-11 NOTE — Discharge Instructions (Signed)
Post-Op Instructions - Rotator Cuff Repair  1. Bracing: You will wear a shoulder immobilizer or sling for 6 weeks.   2. Driving: No driving for 3 weeks post-op. When driving, do not wear the immobilizer. Ideally, we recommend no driving for 6 weeks while sling is in place as one arm will be immobilized.   3. Activity: No active lifting for 2 months. Wrist, hand, and elbow motion only. Avoid lifting the upper arm away from the body except for hygiene. You are permitted to bend and straighten the elbow passively only (no active elbow motion). You may use your hand and wrist for typing, writing, and managing utensils (cutting food). Do not lift more than a coffee cup for 8 weeks.  When sleeping or resting, inclined positions (recliner chair or wedge pillow) and a pillow under the forearm for support may provide better comfort for up to 4 weeks.  Avoid long distance travel for 4 weeks.  Return to normal activities after rotator cuff repair repair normally takes 6 months on average. If rehab goes very well, may be able to do most activities at 4 months, except overhead or contact sports.  4. Physical Therapy: Begins 3-4 days after surgery, and proceed 1 time per week for the first 6 weeks, then 1-2 times per week from weeks 6-20 post-op.  5. Medications:  - You will be provided a prescription for narcotic pain medicine. After surgery, take 1-2 narcotic tablets every 4 hours if needed for severe pain.  - A prescription for anti-nausea medication will be provided in case the narcotic medicine causes nausea - take 1 tablet every 6 hours only if nauseated.   - Take tylenol 1000 mg (2 Extra Strength tablets or 3 regular strength) every 8 hours for pain.  May decrease or stop tylenol 5 days after surgery if you are having minimal pain. - Take ASA 325mg/day x 2 weeks to help prevent DVTs/PEs (blood clots).  - DO NOT take ANY nonsteroidal anti-inflammatory pain medications (Advil, Motrin, Ibuprofen, Aleve,  Naproxen, or Naprosyn). These medicines can inhibit healing of your shoulder repair.    If you are taking prescription medication for anxiety, depression, insomnia, muscle spasm, chronic pain, or for attention deficit disorder, you are advised that you are at a higher risk of adverse effects with use of narcotics post-op, including narcotic addiction/dependence, depressed breathing, death. If you use non-prescribed substances: alcohol, marijuana, cocaine, heroin, methamphetamines, etc., you are at a higher risk of adverse effects with use of narcotics post-op, including narcotic addiction/dependence, depressed breathing, death. You are advised that taking > 50 morphine milligram equivalents (MME) of narcotic pain medication per day results in twice the risk of overdose or death. For your prescription provided: oxycodone 5 mg - taking more than 6 tablets per day would result in > 50 morphine milligram equivalents (MME) of narcotic pain medication. Be advised that we will prescribe narcotics short-term, for acute post-operative pain only - 3 weeks for major operations such as shoulder repair/reconstruction surgeries.     6. Post-Op Appointment:  Your first post-op appointment will be 10-14 days post-op.  7. Work or School: For most, but not all procedures, we advise staying out of work or school for at least 1 to 2 weeks in order to recover from the stress of surgery and to allow time for healing.   If you need a work or school note this can be provided.   8. Smoking: If you are a smoker, you need to refrain from   smoking in the postoperative period. The nicotine in cigarettes will inhibit healing of your shoulder repair and decrease the chance of successful repair. Similarly, nicotine containing products (gum, patches) should be avoided.  ° °Post-operative Brace: °Apply and remove the brace you received as you were instructed to at the time of fitting and as described in detail as the brace’s  instructions for use indicate.  Wear the brace for the period of time prescribed by your physician.  The brace can be cleaned with soap and water and allowed to air dry only.  Should the brace result in increased pain, decreased feeling (numbness/tingling), increased swelling or an overall worsening of your medical condition, please contact your doctor immediately.  If an emergency situation occurs as a result of wearing the brace after normal business hours, please dial 911 and seek immediate medical attention.  Let your doctor know if you have any further questions about the brace issued to you. °Refer to the shoulder sling instructions for use if you have any questions regarding the correct fit of your shoulder sling.  °[employer] Customer Care for Troubleshooting: [phone] ° °Video that illustrates how to properly use a shoulder sling: °"Instructions for Proper Use of an Orthopaedic Sling" °https://www.youtube.com/watch?v=AHZpn_Xo45w ° ° °AMBULATORY SURGERY  °DISCHARGE INSTRUCTIONS ° ° °1) The drugs that you were given will stay in your system until tomorrow so for the next 24 hours you should not: ° °A) Drive an automobile °B) Make any legal decisions °C) Drink any alcoholic beverage ° ° °2) You may resume regular meals tomorrow.  Today it is better to start with liquids and gradually work up to solid foods. ° °You may eat anything you prefer, but it is better to start with liquids, then soup and crackers, and gradually work up to solid foods. ° ° °3) Please notify your doctor immediately if you have any unusual bleeding, trouble breathing, redness and pain at the surgery site, drainage, fever, or pain not relieved by medication. ° ° ° °4) Additional Instructions: ° ° ° ° ° ° ° °Please contact your physician with any problems or Same Day Surgery at [phone], Monday through Friday 6 am to 4 pm, or [place] [facility] number at [phone]. ° ° °

## 2020-09-11 NOTE — Anesthesia Preprocedure Evaluation (Addendum)
Anesthesia Evaluation  Patient identified by MRN, date of birth, ID band Patient awake    Reviewed: Allergy & Precautions, H&P , NPO status , Patient's Chart, lab work & pertinent test results  History of Anesthesia Complications Negative for: history of anesthetic complications  Airway Mallampati: III  TM Distance: <3 FB Neck ROM: full    Dental  (+) Chipped   Pulmonary shortness of breath and with exertion, asthma , sleep apnea and Continuous Positive Airway Pressure Ventilation , neg recent URI, Not current smoker,    Pulmonary exam normal        Cardiovascular Exercise Tolerance: Good hypertension, (-) angina(-) Past MI and (-) Cardiac Stents Normal cardiovascular exam(-) dysrhythmias      Neuro/Psych Bell's palsy  Neuromuscular disease negative psych ROS   GI/Hepatic negative GI ROS, Neg liver ROS,   Endo/Other  diabetes, Type 2Morbid obesity  Renal/GU CRFRenal disease     Musculoskeletal  (+) Arthritis ,   Abdominal   Peds  Hematology  (+) Blood dyscrasia, anemia , Hgb 12.4   Anesthesia Other Findings Obesity  Past Medical History: No date: Bell's palsy     Comment:  SEVERAL TIMES No date: Bell's palsy No date: Cancer ([place])     Comment:  SKIN No date: Diabetes mellitus without complication (HCC) No date: Dyspnea     Comment:  DOE No date: Hyperlipemia No date: Hypertension No date: Neuropathy No date: Obesity No date: Sleep apnea     Comment:  C PAP  Past Surgical History: No date: APPENDECTOMY 05/20/2017: CATARACT EXTRACTION W/PHACO; Left     Comment:  Procedure: CATARACT EXTRACTION PHACO AND INTRAOCULAR               LENS PLACEMENT (IOC);  Surgeon: Birder Robson, MD;                Location: [facility] ORS;  Service: Ophthalmology;  Laterality:              Left;  [place] 00:32.0 AP% 15.5 CDE 4.97 Fluid Pack Lot #               [id number] 06/10/2017: CATARACT EXTRACTION W/PHACO; Right      Comment:  Procedure: CATARACT EXTRACTION PHACO AND INTRAOCULAR               LENS PLACEMENT (IOC);  Surgeon: Birder Robson, MD;                Location: [facility] ORS;  Service: Ophthalmology;  Laterality:              Right;  [place]  00:51 AP% 15.4 CDE 7.95 Fluid pack lot #               [id number] No date: CHOLECYSTECTOMY 07/26/2019: CYSTOSCOPY WITH STENT PLACEMENT; Right     Comment:  Procedure: CYSTOSCOPY WITH STENT PLACEMENT;  Surgeon:               Lucas Mallow, MD;  Location: [facility] ORS;  Service:               Urology;  Laterality: Right; No date: FRACTURE SURGERY     Comment:  WRIST No date: HAND SURGERY No date: HERNIA REPAIR No date: TONSILLECTOMY     Reproductive/Obstetrics negative OB ROS                            Anesthesia Physical  Anesthesia Plan  ASA:  III  Anesthesia Plan: General ETT   Post-op Pain Management:  Regional for Post-op pain and GA combined w/ Regional for post-op pain   Induction: Intravenous  PONV Risk Score and Plan: Ondansetron, Dexamethasone and Treatment may vary due to age or medical condition  Airway Management Planned: Oral ETT  Additional Equipment:   Intra-op Plan:   Post-operative Plan: Extubation in OR  Informed Consent: I have reviewed the patients History and Physical, chart, labs and discussed the procedure including the risks, benefits and alternatives for the proposed anesthesia with the patient or authorized representative who has indicated his/her understanding and acceptance.     Dental Advisory Given  Plan Discussed with: Anesthesiologist  Anesthesia Plan Comments: (Patient consented for risks of anesthesia including but not limited to:  - adverse reactions to medications - damage to eyes, teeth, lips or other oral mucosa - nerve damage due to positioning  - sore throat or hoarseness - Damage to heart, brain, nerves, lungs, other parts of body or loss of life  Patient voiced  understanding.   Right interscalene block was also discussed and the risks were explained which included cardiac arrest, seizures, nerve damage, pneumothorax, infection, difficulty breathing and block not working well.  The patient voiced his acceptance of the risks and wishes to proceed with the block.  It was sterssed to the patient that he should use his CPAP machine especially during sleep and at all times that he might be dozing.  Patient voiced his understanding of all othe the above.  The block was done at the request of the surgeon.)      Anesthesia Quick Evaluation

## 2020-09-11 NOTE — Anesthesia Procedure Notes (Signed)
Procedure Name: Intubation Date/Time: 09/11/2020 7:40 AM Performed by: Philbert Riser, CRNA Pre-anesthesia Checklist: Patient identified, Emergency Drugs available, Suction available and Patient being monitored Patient Re-evaluated:Patient Re-evaluated prior to induction Oxygen Delivery Method: Circle system utilized Preoxygenation: Pre-oxygenation with 100% oxygen Induction Type: IV induction Ventilation: Mask ventilation without difficulty Laryngoscope Size: McGraph and 4 Grade View: Grade II Tube type: Oral Tube size: 7.5 mm Number of attempts: 1 Airway Equipment and Method: Stylet and Oral airway Placement Confirmation: ETT inserted through vocal cords under direct vision,  positive ETCO2 and breath sounds checked- equal and bilateral Tube secured with: Tape Dental Injury: Teeth and Oropharynx as per pre-operative assessment

## 2020-09-11 NOTE — H&P (Signed)
Paper H&P to be scanned into permanent record. H&P reviewed. No significant changes noted.  

## 2020-09-11 NOTE — Anesthesia Procedure Notes (Signed)
Anesthesia Regional Block: Interscalene brachial plexus block   Pre-Anesthetic Checklist: ,, timeout performed, Correct Patient, Correct Site, Correct Laterality, Correct Procedure, Correct Position, site marked, Risks and benefits discussed,  Surgical consent,  Pre-op evaluation,  At surgeon's request and post-op pain management  Laterality: Right  Prep: chloraprep, alcohol swabs       Needles:  Injection technique: Single-shot  Needle Type: Stimiplex     Needle Length: 5cm  Needle Gauge: 22     Additional Needles:   Procedures:, nerve stimulator,,, ultrasound used (permanent image in chart),,,,   Nerve Stimulator or Paresthesia:  Response: biceps flexion, 0.6 mA,   Additional Responses:   Narrative:  Start time: 09/11/2020 7:25 AM End time: 09/11/2020 7:29 AM Injection made incrementally with aspirations every 5 mL.  Performed by: Personally  Anesthesiologist: Alvin Critchley, MD  Additional Notes: Functioning IV was confirmed and monitors were applied.  A 64mm 22ga Stimuplex needle was used. Sterile prep and drape,hand hygiene and sterile gloves were used.  Negative aspiration and negative test dose prior to incremental administration of local anesthetic. The patient tolerated the procedure well.  Transient paresthesia to right elbow resolved completely with reposition of needle.  Easy injection throughout with no pain.

## 2020-09-12 ENCOUNTER — Encounter: Payer: Self-pay | Admitting: Orthopedic Surgery

## 2020-09-15 DIAGNOSIS — M25511 Pain in right shoulder: Secondary | ICD-10-CM | POA: Diagnosis not present

## 2020-09-15 DIAGNOSIS — M25611 Stiffness of right shoulder, not elsewhere classified: Secondary | ICD-10-CM | POA: Diagnosis not present

## 2020-09-15 DIAGNOSIS — G4733 Obstructive sleep apnea (adult) (pediatric): Secondary | ICD-10-CM | POA: Diagnosis not present

## 2020-09-15 DIAGNOSIS — Z9889 Other specified postprocedural states: Secondary | ICD-10-CM | POA: Diagnosis not present

## 2020-09-15 DIAGNOSIS — M6281 Muscle weakness (generalized): Secondary | ICD-10-CM | POA: Diagnosis not present

## 2020-09-18 DIAGNOSIS — Z9889 Other specified postprocedural states: Secondary | ICD-10-CM | POA: Diagnosis not present

## 2020-09-18 DIAGNOSIS — M25511 Pain in right shoulder: Secondary | ICD-10-CM | POA: Diagnosis not present

## 2020-09-18 DIAGNOSIS — M25611 Stiffness of right shoulder, not elsewhere classified: Secondary | ICD-10-CM | POA: Diagnosis not present

## 2020-09-18 DIAGNOSIS — M6281 Muscle weakness (generalized): Secondary | ICD-10-CM | POA: Diagnosis not present

## 2020-09-19 NOTE — Anesthesia Postprocedure Evaluation (Signed)
Anesthesia Post Note  Patient: Christop Gene Goyne  Procedure(s) Performed: Right shoulder arthroscopic rotator cuff repair vs Regeneten patch application, subacromial decompression, and biceps tenodesis - Reche Dixon to Assist (Right )  Patient location during evaluation: PACU Anesthesia Type: General Level of consciousness: awake and alert and oriented Pain management: pain level controlled Vital Signs Assessment: post-procedure vital signs reviewed and stable Respiratory status: spontaneous breathing Cardiovascular status: blood pressure returned to baseline Anesthetic complications: no   No complications documented.   Last Vitals:  Vitals:   09/11/20 1100 09/11/20 1113  BP:  (!) 147/70  Pulse: 71 76  Resp: 18 15  Temp:  (!) 36.2 C  SpO2: 98% 96%    Last Pain:  Vitals:   09/12/20 0825  TempSrc:   PainSc: 0-No pain                 Omair Dettmer

## 2020-10-01 ENCOUNTER — Ambulatory Visit
Admission: EM | Admit: 2020-10-01 | Discharge: 2020-10-01 | Disposition: A | Discharge status: Home / Self Care | Payer: PPO | Attending: Emergency Medicine | Admitting: Emergency Medicine

## 2020-10-01 ENCOUNTER — Other Ambulatory Visit: Payer: Self-pay

## 2020-10-01 DIAGNOSIS — R609 Edema, unspecified: Secondary | ICD-10-CM

## 2020-10-01 DIAGNOSIS — L03119 Cellulitis of unspecified part of limb: Secondary | ICD-10-CM

## 2020-10-01 MED ORDER — SULFAMETHOXAZOLE-TRIMETHOPRIM 800-160 MG PO TABS: 1.0000 | ORAL_TABLET | Freq: Two times a day (BID) | ORAL | 0 refills | Status: AC

## 2020-10-01 NOTE — Discharge Instructions (Addendum)
You were seen for swelling in your lower legs and are being treated for cellulitis.   Take the antibiotics as prescribed until they're finished. If you think you're having a reaction, stop the medication, take benadryl and go to the nearest urgent care/emergency room. Take a probiotic while taking the antibiotic to decrease the chances of stomach upset.   Make sure to follow-up with your cardiologist as scheduled on Tuesday, 10/03/2020.  Call your nephrologist first thing tomorrow morning.  Take care, Dr. Marland Kitchen, NP-c

## 2020-10-01 NOTE — ED Triage Notes (Signed)
Pt c/o possible fluid retention over the last few weeks. Pt states he has gained about 12lbs. Pt does take Lasix TIW and has had 3 this week already. Pt recently had rotator cuff surgery and has not been as mobile. Pt reports he is having to sleep in a recliner. Pt reports pain to the feet and ankles, bilateral. Pt does have red discoloration to the RLE, this is new. Pt also reports difficulty walking.

## 2020-10-01 NOTE — ED Provider Notes (Signed)
[facility] - [place], [place]   Name: James Clarke DOB: 13-Jul-1941 MRN: [id number] CSN: [id number] PCP: Sofie Hartigan, MD  Arrival date and time:  10/01/20 1359  Chief Complaint:  Leg Swelling   NOTE: Prior to seeing the patient today, I have reviewed the triage nursing documentation and vital signs. Clinical staff has updated patient's PMH/PSHx, current medication list, and drug allergies/intolerances to ensure comprehensive history available to assist in medical decision making.   History:   HPI: James Clarke is a 80 y.o. male who presents today with complaints of increased lower extremity edema x2 weeks.  Patient has chronic lower extremity mid due to mild vascular disorder, but over the past 2 weeks patient has noticed his lower extremity weight has increased to the point he is has difficulty walking and pain in his lower extremities.  Patient has also noticed a weight gain of approximately 12 pounds.  Patient is prescribed Lasix to be taken as needed and he has taken it 3 times this week.  No relief has been noted with this diuretic.  Patient had rotator cuff surgery on January 21.  Since his surgery patient has been more stationary than usual due to pain and limited range of motion.  Patient currently sleeps in recliner per surgery recommendations.  Patient is short of breath, but he states he is short of breath at baseline.  He denies any chest pain or pressure, pain with breathing, or edema to his abdomen or upper extremities.   Past Medical History:  Diagnosis Date  . Asthma   . Bell's palsy    SEVERAL TIMES  . Bell's palsy   . Cancer ([place])    SKIN  . Diabetes mellitus without complication ([place])   . Dyspnea    DOE  . History of kidney stones   . Hyperlipemia   . Hypertension   . Kidney stone   . Neuropathy   . Obesity   . Sleep apnea    C PAP    Past Surgical History:  Procedure Laterality Date  . APPENDECTOMY    . CATARACT EXTRACTION  W/PHACO Left 05/20/2017   Procedure: CATARACT EXTRACTION PHACO AND INTRAOCULAR LENS PLACEMENT (IOC);  Surgeon: Birder Robson, MD;  Location: [facility] ORS;  Service: Ophthalmology;  Laterality: Left;  [place] 00:32.0 AP% 15.5 CDE 4.97 Fluid Pack Lot # [id number]  . CATARACT EXTRACTION W/PHACO Right 06/10/2017   Procedure: CATARACT EXTRACTION PHACO AND INTRAOCULAR LENS PLACEMENT ([place]);  Surgeon: Birder Robson, MD;  Location: [facility];  Service: Ophthalmology;  Laterality: Right;  [place]  00:51 AP% 15.4 CDE 7.95 Fluid pack lot # [id number]  . CHOLECYSTECTOMY    . CYSTOSCOPY W/ RETROGRADES Right 08/13/2019   Procedure: CYSTOSCOPY WITH RETROGRADE PYELOGRAM;  Surgeon: Billey Co, MD;  Location: [facility];  Service: Urology;  Laterality: Right;  . CYSTOSCOPY WITH STENT PLACEMENT Right 07/26/2019   Procedure: CYSTOSCOPY WITH STENT PLACEMENT;  Surgeon: Lucas Mallow, MD;  Location: [facility];  Service: Urology;  Laterality: Right;  . CYSTOSCOPY/URETEROSCOPY/HOLMIUM LASER/STENT PLACEMENT Right 08/13/2019   Procedure: CYSTOSCOPY/URETEROSCOPY/HOLMIUM LASER/STENT EXCHANGE;  Surgeon: Billey Co, MD;  Location: [facility];  Service: Urology;  Laterality: Right;  . FRACTURE SURGERY     WRIST  . HAND SURGERY    . HERNIA REPAIR    . SHOULDER ARTHROSCOPY WITH SUBACROMIAL DECOMPRESSION AND OPEN ROTATOR C Right 09/11/2020   Procedure: Right shoulder arthroscopic rotator cuff repair vs Regeneten patch application, subacromial decompression, and biceps  tenodesis - Reche Dixon to Assist;  Surgeon: Leim Fabry, MD;  Location: [facility];  Service: Orthopedics;  Laterality: Right;  . TONSILLECTOMY      Family History  Problem Relation Age of Onset  . Emphysema Mother   . COPD Mother   . Heart disease Mother   . Brain cancer Father     Social History   Tobacco Use  . Smoking status: Never Smoker  . Smokeless tobacco: Never Used  Vaping Use  . Vaping Use: Never used  Substance Use Topics  . Alcohol use:  No  . Drug use: Not Currently    Patient Active Problem List   Diagnosis Date Noted  . Kidney stones 02/16/2020  . Anemia in chronic kidney disease 07/26/2019  . Chronic kidney disease, stage III (moderate) ([place]) 07/26/2019  . Edema of lower extremity 07/26/2019  . Malignant essential hypertension 07/26/2019  . Secondary hyperparathyroidism of renal origin ([place]) 07/26/2019  . Ureteral calculus 07/26/2019  . Old complex tear of medial meniscus of right knee 10/07/2018  . Primary osteoarthritis of right knee 10/07/2018  . Fatty liver 04/16/2018  . Aortic atherosclerosis ([place]) 04/16/2018  . Bilateral leg edema 03/17/2018  . Morbid obesity due to excess calories ([place]) 11/11/2014  . Microalbuminuria 11/11/2014  . Long-term insulin use ([place]) 11/11/2014  . Perennial allergic rhinitis 09/12/2014  . Type 2 diabetes mellitus with renal manifestations ([place]) 03/19/2014  . Sleep apnea 03/19/2014  . Hypertension 03/19/2014  . Hyperlipemia 03/19/2014    Home Medications:    Current Meds  Medication Sig  . acetaminophen (TYLENOL) 500 MG tablet Take 1,000 mg by mouth every 8 (eight) hours as needed for moderate pain.  Marland Kitchen acetaminophen (TYLENOL) 500 MG tablet Take 2 tablets (1,000 mg total) by mouth every 8 (eight) hours.  Marland Kitchen albuterol (PROVENTIL HFA;VENTOLIN HFA) 108 (90 Base) MCG/ACT inhaler Inhale 2 puffs into the lungs every 6 (six) hours as needed for wheezing or shortness of breath.  Marland Kitchen amLODipine-olmesartan (AZOR) 5-40 MG tablet Take 1 tablet by mouth every morning.   . calcitRIOL (ROCALTROL) 0.25 MCG capsule Take 0.25 mcg by mouth daily.  . carboxymethylcellulose (REFRESH PLUS) 0.5 % SOLN Place 1 drop into both eyes daily as needed (eye irritation).  . cetirizine (ZYRTEC) 10 MG tablet Take 10 mg by mouth daily as needed for allergies.  . chlorthalidone (HYGROTON) 25 MG tablet Take 25 mg by mouth daily.  . fluticasone (FLONASE) 50 MCG/ACT nasal spray Place 2 sprays into both nostrils daily  as needed for allergies.   . furosemide (LASIX) 40 MG tablet Take 40 mg by mouth 2 (two) times a week.  Marland Kitchen GLOBAL INJECT EASE INSULIN SYR 31G X 5/16" 1 ML MISC 615 mg.   . insulin aspart (NOVOLOG) 100 UNIT/ML injection Inject 30-80 Units into the skin See admin instructions. 50 units with breakfast, 30 units with lunch, and 80 units with supper  . insulin detemir (LEVEMIR) 100 UNIT/ML injection Inject 90 Units into the skin at bedtime.  . lovastatin (MEVACOR) 40 MG tablet Take 40 mg by mouth daily with supper.  . metoprolol succinate (TOPROL-XL) 25 MG 24 hr tablet Take 25 mg by mouth every morning.   . ondansetron (ZOFRAN ODT) 4 MG disintegrating tablet Take 1 tablet (4 mg total) by mouth every 8 (eight) hours as needed for nausea or vomiting.  . potassium chloride SA (K-DUR,KLOR-CON) 20 MEQ tablet Take 20 mEq by mouth daily.  . traMADol (ULTRAM) 50 MG tablet Take 1-2 tablets (50-100  mg total) by mouth every 6 (six) hours as needed.    Allergies:   Codeine, Doxycycline, and Erythromycin  Review of Systems (ROS): Review of Systems  Constitutional: Positive for activity change and unexpected weight change.  Respiratory: Positive for shortness of breath. Negative for cough and chest tightness.   Cardiovascular: Negative for chest pain.  Musculoskeletal: Positive for gait problem and myalgias.  Skin: Positive for color change and rash.     Vital Signs: Today's Vitals   10/01/20 1411 10/01/20 1412 10/01/20 1415  BP:   (!) 142/80  Pulse:   76  Resp:   18  Temp:   98.4 F (36.9 C)  TempSrc:   Oral  SpO2:   100%  Weight:  (!) 337 lb (152.9 kg)   Height:  5\' 10"  (1.778 m)   PainSc: 8       Physical Exam: Physical Exam Vitals and nursing note reviewed.  Constitutional:      Appearance: Normal appearance.  Cardiovascular:     Rate and Rhythm: Normal rate and regular rhythm.     Pulses:          Dorsalis pedis pulses are detected w/ Doppler on the right side and detected w/ Doppler  on the left side.       Posterior tibial pulses are detected w/ Doppler on the right side and detected w/ Doppler on the left side.     Heart sounds: Murmur heard.   Systolic murmur is present with a grade of 2/6.   Pulmonary:     Effort: Pulmonary effort is normal.     Breath sounds: Normal breath sounds.  Musculoskeletal:     Right lower leg: 4+ Edema present.     Left lower leg: 4+ Edema present.  Skin:    General: Skin is warm and dry.     Findings: Erythema and rash present. No bruising. Rash is macular.  Neurological:     General: No focal deficit present.     Mental Status: He is alert and oriented to person, place, and time.  Psychiatric:        Mood and Affect: Mood normal.        Behavior: Behavior normal.      Urgent Care Treatments / Results:   LABS: PLEASE NOTE: all labs that were ordered this encounter are listed, however only abnormal results are displayed. Labs Reviewed  BASIC METABOLIC PANEL  CBC  *Unable to collect after 3 attempts.*  EKG: -None  RADIOLOGY: No results found.  PROCEDURES: Procedures  MEDICATIONS RECEIVED THIS VISIT: Medications - No data to display  PERTINENT CLINICAL COURSE NOTES/UPDATES:   Initial Impression / Assessment and Plan / Urgent Care Course:  Pertinent labs & imaging results that were available during my care of the patient were personally reviewed by me and considered in my medical decision making (see lab/imaging section of note for values and interpretations).  James Clarke is a 80 y.o. male who presents to [facility] today with complaints of swelling to lower extremities, diagnosed with cellulitis and lower extremity edema, and treated as such with the medications below.   NP, patient, and patient's wife discussed the option of possible further evaluation at a local emergency department.  NP described her concerns of decreasing kidney function and or cardiac function and recommended evaluation at the  emergency department to rule out these etiologies.  Patient and patient's wife chose to receive oral antibiotics to treat cellulitis to right lower extremity  and will follow up with specialist (cardiologist and nephrologist) in the next 48 hours.  Strict emergency room precautions reviewed.  Patient and patient's wife showed understanding with teach back.  I have reviewed the follow up and strict return precautions for any new or worsening symptoms. Patient is aware of symptoms that would be deemed urgent/emergent, and would thus require further evaluation either here or in the emergency department. At the time of discharge, he verbalized understanding and consent with the discharge plan as it was reviewed with him. All questions were fielded by provider and/or clinic staff prior to patient discharge.    Final Clinical Impressions / Urgent Care Diagnoses:   Final diagnoses:  Cellulitis of lower extremity, unspecified laterality  Peripheral edema    New Prescriptions:  [place] Controlled Substance Registry consulted? Not Applicable  Meds ordered this encounter  Medications  . sulfamethoxazole-trimethoprim (BACTRIM DS) 800-160 MG tablet    Sig: Take 1 tablet by mouth 2 (two) times daily for 7 days.    Dispense:  14 tablet    Refill:  0      Discharge Instructions     You were seen for swelling in your lower legs and are being treated for cellulitis.   Take the antibiotics as prescribed until they're finished. If you think you're having a reaction, stop the medication, take benadryl and go to the nearest urgent care/emergency room. Take a probiotic while taking the antibiotic to decrease the chances of stomach upset.   Make sure to follow-up with your cardiologist as scheduled on Tuesday, 10/03/2020.  Call your nephrologist first thing tomorrow morning.  Take care, Dr. Marland Kitchen, NP-c      Recommended Follow up Care:  Patient encouraged to follow up with the following provider within the  specified time frame, or sooner as dictated by the severity of his symptoms. As always, he was instructed that for any urgent/emergent care needs, he should seek care either here or in the emergency department for more immediate evaluation.   Gertie Baron, DNP, NP-c    Gertie Baron, NP 10/01/20 1549

## 2020-10-02 DIAGNOSIS — L03119 Cellulitis of unspecified part of limb: Secondary | ICD-10-CM | POA: Diagnosis not present

## 2020-10-02 DIAGNOSIS — N184 Chronic kidney disease, stage 4 (severe): Secondary | ICD-10-CM | POA: Diagnosis not present

## 2020-10-02 DIAGNOSIS — I10 Essential (primary) hypertension: Secondary | ICD-10-CM | POA: Diagnosis not present

## 2020-10-02 DIAGNOSIS — E1122 Type 2 diabetes mellitus with diabetic chronic kidney disease: Secondary | ICD-10-CM | POA: Diagnosis not present

## 2020-10-03 DIAGNOSIS — E1122 Type 2 diabetes mellitus with diabetic chronic kidney disease: Secondary | ICD-10-CM | POA: Diagnosis not present

## 2020-10-03 DIAGNOSIS — G4733 Obstructive sleep apnea (adult) (pediatric): Secondary | ICD-10-CM | POA: Diagnosis not present

## 2020-10-03 DIAGNOSIS — I10 Essential (primary) hypertension: Secondary | ICD-10-CM | POA: Diagnosis not present

## 2020-10-03 DIAGNOSIS — R600 Localized edema: Secondary | ICD-10-CM | POA: Diagnosis not present

## 2020-10-03 DIAGNOSIS — E7800 Pure hypercholesterolemia, unspecified: Secondary | ICD-10-CM | POA: Diagnosis not present

## 2020-10-03 DIAGNOSIS — I5021 Acute systolic (congestive) heart failure: Secondary | ICD-10-CM | POA: Diagnosis not present

## 2020-10-03 DIAGNOSIS — Z794 Long term (current) use of insulin: Secondary | ICD-10-CM | POA: Diagnosis not present

## 2020-10-03 DIAGNOSIS — N184 Chronic kidney disease, stage 4 (severe): Secondary | ICD-10-CM | POA: Diagnosis not present

## 2020-10-05 DIAGNOSIS — Z9889 Other specified postprocedural states: Secondary | ICD-10-CM | POA: Diagnosis not present

## 2020-10-05 DIAGNOSIS — M6281 Muscle weakness (generalized): Secondary | ICD-10-CM | POA: Diagnosis not present

## 2020-10-05 DIAGNOSIS — M25511 Pain in right shoulder: Secondary | ICD-10-CM | POA: Diagnosis not present

## 2020-10-05 DIAGNOSIS — M25611 Stiffness of right shoulder, not elsewhere classified: Secondary | ICD-10-CM | POA: Diagnosis not present

## 2020-10-11 ENCOUNTER — Ambulatory Visit
Admission: RE | Admit: 2020-10-11 | Discharge: 2020-10-11 | Disposition: A | Discharge status: Home / Self Care | Payer: PPO | Source: Ambulatory Visit | Attending: Nephrology | Admitting: Nephrology

## 2020-10-11 DIAGNOSIS — N183 Chronic kidney disease, stage 3 unspecified: Secondary | ICD-10-CM | POA: Diagnosis not present

## 2020-10-11 DIAGNOSIS — R609 Edema, unspecified: Secondary | ICD-10-CM | POA: Insufficient documentation

## 2020-10-11 LAB — RENAL FUNCTION PANEL
Albumin: 3.4 g/dL — ABNORMAL LOW (ref 3.5–5.0)
Anion gap: 11 (ref 5–15)
BUN: 80 mg/dL — ABNORMAL HIGH (ref 8–23)
CO2: 28 mmol/L (ref 22–32)
Calcium: 9.4 mg/dL (ref 8.9–10.3)
Chloride: 102 mmol/L (ref 98–111)
Creatinine, Ser: 2.99 mg/dL — ABNORMAL HIGH (ref 0.61–1.24)
GFR, Estimated: 20 mL/min — ABNORMAL LOW (ref >60)
Glucose, Bld: 143 mg/dL — ABNORMAL HIGH (ref 70–99)
Phosphorus: 4 mg/dL (ref 2.5–4.6)
Potassium: 3.6 mmol/L (ref 3.5–5.1)
Sodium: 141 mmol/L (ref 135–145)

## 2020-10-11 MED ORDER — FUROSEMIDE 10 MG/ML IJ SOLN
INTRAMUSCULAR | Status: AC
  Administered 2020-10-11: 40 mg via INTRAVENOUS
  Filled 2020-10-11: qty 4

## 2020-10-11 MED ORDER — FUROSEMIDE 10 MG/ML IJ SOLN: 40.0000 mg | Freq: Once | INTRAMUSCULAR | Status: AC

## 2020-10-11 MED ORDER — POTASSIUM CHLORIDE CRYS ER 20 MEQ PO TBCR: 20.0000 meq | EXTENDED_RELEASE_TABLET | Freq: Once | ORAL | Status: AC

## 2020-10-11 MED ORDER — POTASSIUM CHLORIDE CRYS ER 20 MEQ PO TBCR
EXTENDED_RELEASE_TABLET | ORAL | Status: AC
  Administered 2020-10-11: 20 meq via ORAL
  Filled 2020-10-11: qty 1

## 2020-10-13 ENCOUNTER — Ambulatory Visit
Admission: RE | Admit: 2020-10-13 | Discharge: 2020-10-13 | Disposition: A | Discharge status: Home / Self Care | Payer: PPO | Source: Ambulatory Visit | Attending: Nephrology | Admitting: Nephrology

## 2020-10-13 DIAGNOSIS — R600 Localized edema: Secondary | ICD-10-CM | POA: Insufficient documentation

## 2020-10-13 DIAGNOSIS — N183 Chronic kidney disease, stage 3 unspecified: Secondary | ICD-10-CM | POA: Insufficient documentation

## 2020-10-13 MED ORDER — FUROSEMIDE 10 MG/ML IJ SOLN
40.0000 mg | Freq: Once | INTRAMUSCULAR | Status: AC
  Administered 2020-10-13: 40 mg via INTRAVENOUS

## 2020-10-13 MED ORDER — POTASSIUM CHLORIDE CRYS ER 20 MEQ PO TBCR: 20.0000 meq | EXTENDED_RELEASE_TABLET | Freq: Once | ORAL | Status: AC

## 2020-10-13 MED ORDER — FUROSEMIDE 10 MG/ML IJ SOLN
INTRAMUSCULAR | Status: AC
  Filled 2020-10-13: qty 4

## 2020-10-13 MED ORDER — POTASSIUM CHLORIDE CRYS ER 20 MEQ PO TBCR
EXTENDED_RELEASE_TABLET | ORAL | Status: AC
  Administered 2020-10-13: 20 meq via ORAL
  Filled 2020-10-13: qty 1

## 2020-10-17 ENCOUNTER — Ambulatory Visit
Admission: RE | Admit: 2020-10-17 | Discharge: 2020-10-17 | Disposition: A | Discharge status: Home / Self Care | Payer: PPO | Source: Ambulatory Visit | Attending: Nephrology | Admitting: Nephrology

## 2020-10-17 ENCOUNTER — Other Ambulatory Visit: Payer: Self-pay

## 2020-10-17 DIAGNOSIS — N183 Chronic kidney disease, stage 3 unspecified: Secondary | ICD-10-CM | POA: Insufficient documentation

## 2020-10-17 DIAGNOSIS — R609 Edema, unspecified: Secondary | ICD-10-CM | POA: Diagnosis not present

## 2020-10-17 LAB — RENAL FUNCTION PANEL
Albumin: 3.4 g/dL — ABNORMAL LOW (ref 3.5–5.0)
Anion gap: 12 (ref 5–15)
BUN: 79 mg/dL — ABNORMAL HIGH (ref 8–23)
CO2: 27 mmol/L (ref 22–32)
Calcium: 9.6 mg/dL (ref 8.9–10.3)
Chloride: 100 mmol/L (ref 98–111)
Creatinine, Ser: 2.35 mg/dL — ABNORMAL HIGH (ref 0.61–1.24)
GFR, Estimated: 27 mL/min — ABNORMAL LOW (ref >60)
Glucose, Bld: 119 mg/dL — ABNORMAL HIGH (ref 70–99)
Phosphorus: 3.5 mg/dL (ref 2.5–4.6)
Potassium: 3.6 mmol/L (ref 3.5–5.1)
Sodium: 139 mmol/L (ref 135–145)

## 2020-10-17 MED ORDER — FUROSEMIDE 10 MG/ML IJ SOLN: 40.0000 mg | Freq: Once | INTRAMUSCULAR | Status: AC

## 2020-10-17 MED ORDER — FUROSEMIDE 10 MG/ML IJ SOLN
INTRAMUSCULAR | Status: AC
  Administered 2020-10-17: 40 mg via INTRAVENOUS
  Filled 2020-10-17: qty 4

## 2020-10-17 MED ORDER — POTASSIUM CHLORIDE CRYS ER 20 MEQ PO TBCR: 20.0000 meq | EXTENDED_RELEASE_TABLET | Freq: Once | ORAL | Status: AC

## 2020-10-17 MED ORDER — POTASSIUM CHLORIDE CRYS ER 20 MEQ PO TBCR
EXTENDED_RELEASE_TABLET | ORAL | Status: AC
  Administered 2020-10-17: 20 meq via ORAL
  Filled 2020-10-17: qty 1

## 2020-10-18 DIAGNOSIS — Z9889 Other specified postprocedural states: Secondary | ICD-10-CM | POA: Diagnosis not present

## 2020-10-18 DIAGNOSIS — M25611 Stiffness of right shoulder, not elsewhere classified: Secondary | ICD-10-CM | POA: Diagnosis not present

## 2020-10-18 DIAGNOSIS — M25511 Pain in right shoulder: Secondary | ICD-10-CM | POA: Diagnosis not present

## 2020-10-20 ENCOUNTER — Ambulatory Visit
Admission: RE | Admit: 2020-10-20 | Discharge: 2020-10-20 | Disposition: A | Discharge status: Home / Self Care | Payer: PPO | Source: Ambulatory Visit | Attending: Nephrology | Admitting: Nephrology

## 2020-10-20 ENCOUNTER — Other Ambulatory Visit: Payer: Self-pay

## 2020-10-20 DIAGNOSIS — R609 Edema, unspecified: Secondary | ICD-10-CM | POA: Insufficient documentation

## 2020-10-20 DIAGNOSIS — N183 Chronic kidney disease, stage 3 unspecified: Secondary | ICD-10-CM | POA: Diagnosis not present

## 2020-10-20 MED ORDER — FUROSEMIDE 10 MG/ML IJ SOLN
INTRAMUSCULAR | Status: AC
  Administered 2020-10-20: 40 mg via INTRAVENOUS
  Filled 2020-10-20: qty 4

## 2020-10-20 MED ORDER — POTASSIUM CHLORIDE CRYS ER 20 MEQ PO TBCR: 20.0000 meq | EXTENDED_RELEASE_TABLET | Freq: Once | ORAL | Status: AC

## 2020-10-20 MED ORDER — POTASSIUM CHLORIDE CRYS ER 20 MEQ PO TBCR
EXTENDED_RELEASE_TABLET | ORAL | Status: AC
  Administered 2020-10-20: 20 meq via ORAL
  Filled 2020-10-20: qty 1

## 2020-10-20 MED ORDER — FUROSEMIDE 10 MG/ML IJ SOLN: 40.0000 mg | Freq: Once | INTRAMUSCULAR | Status: AC

## 2020-10-23 DIAGNOSIS — E1122 Type 2 diabetes mellitus with diabetic chronic kidney disease: Secondary | ICD-10-CM | POA: Diagnosis not present

## 2020-10-23 DIAGNOSIS — N184 Chronic kidney disease, stage 4 (severe): Secondary | ICD-10-CM | POA: Diagnosis not present

## 2020-10-23 DIAGNOSIS — D631 Anemia in chronic kidney disease: Secondary | ICD-10-CM | POA: Diagnosis not present

## 2020-10-23 DIAGNOSIS — R600 Localized edema: Secondary | ICD-10-CM | POA: Diagnosis not present

## 2020-10-23 DIAGNOSIS — I10 Essential (primary) hypertension: Secondary | ICD-10-CM | POA: Diagnosis not present

## 2020-10-25 DIAGNOSIS — M25511 Pain in right shoulder: Secondary | ICD-10-CM | POA: Diagnosis not present

## 2020-10-25 DIAGNOSIS — M25611 Stiffness of right shoulder, not elsewhere classified: Secondary | ICD-10-CM | POA: Diagnosis not present

## 2020-10-25 DIAGNOSIS — Z9889 Other specified postprocedural states: Secondary | ICD-10-CM | POA: Diagnosis not present

## 2020-10-31 ENCOUNTER — Encounter (INDEPENDENT_AMBULATORY_CARE_PROVIDER_SITE_OTHER): Payer: PPO | Admitting: Nurse Practitioner

## 2020-11-06 DIAGNOSIS — M6281 Muscle weakness (generalized): Secondary | ICD-10-CM | POA: Diagnosis not present

## 2020-11-06 DIAGNOSIS — Z9889 Other specified postprocedural states: Secondary | ICD-10-CM | POA: Diagnosis not present

## 2020-11-06 DIAGNOSIS — M25611 Stiffness of right shoulder, not elsewhere classified: Secondary | ICD-10-CM | POA: Diagnosis not present

## 2020-11-06 DIAGNOSIS — M25511 Pain in right shoulder: Secondary | ICD-10-CM | POA: Diagnosis not present

## 2020-11-07 ENCOUNTER — Encounter (INDEPENDENT_AMBULATORY_CARE_PROVIDER_SITE_OTHER): Payer: Self-pay | Admitting: Nurse Practitioner

## 2020-11-07 ENCOUNTER — Other Ambulatory Visit: Payer: Self-pay

## 2020-11-07 ENCOUNTER — Ambulatory Visit (INDEPENDENT_AMBULATORY_CARE_PROVIDER_SITE_OTHER): Payer: PPO | Admitting: Nurse Practitioner

## 2020-11-07 VITALS — BP 170/73 | HR 73 | Ht 70.0 in | Wt 340.0 lb

## 2020-11-07 DIAGNOSIS — R600 Localized edema: Secondary | ICD-10-CM

## 2020-11-07 DIAGNOSIS — I10 Essential (primary) hypertension: Secondary | ICD-10-CM

## 2020-11-07 DIAGNOSIS — I5021 Acute systolic (congestive) heart failure: Secondary | ICD-10-CM

## 2020-11-07 DIAGNOSIS — E785 Hyperlipidemia, unspecified: Secondary | ICD-10-CM

## 2020-11-08 DIAGNOSIS — M6281 Muscle weakness (generalized): Secondary | ICD-10-CM | POA: Diagnosis not present

## 2020-11-08 DIAGNOSIS — Z9889 Other specified postprocedural states: Secondary | ICD-10-CM | POA: Diagnosis not present

## 2020-11-08 DIAGNOSIS — M25611 Stiffness of right shoulder, not elsewhere classified: Secondary | ICD-10-CM | POA: Diagnosis not present

## 2020-11-08 DIAGNOSIS — M25511 Pain in right shoulder: Secondary | ICD-10-CM | POA: Diagnosis not present

## 2020-11-13 ENCOUNTER — Encounter (INDEPENDENT_AMBULATORY_CARE_PROVIDER_SITE_OTHER): Payer: Self-pay | Admitting: Nurse Practitioner

## 2020-11-13 DIAGNOSIS — E1140 Type 2 diabetes mellitus with diabetic neuropathy, unspecified: Secondary | ICD-10-CM | POA: Diagnosis not present

## 2020-11-13 DIAGNOSIS — Z794 Long term (current) use of insulin: Secondary | ICD-10-CM | POA: Diagnosis not present

## 2020-11-13 DIAGNOSIS — B351 Tinea unguium: Secondary | ICD-10-CM | POA: Diagnosis not present

## 2020-11-13 NOTE — Progress Notes (Signed)
Subjective:    Patient ID: James Clarke, male    DOB: March 15, 1941, 80 y.o.   MRN: [id number] Chief Complaint  Patient presents with  . New Patient (Initial Visit)    Lateef edema of LE    Patient presents today for concerns for bilateral lower extremity swelling.  The swelling began around 09/11/2020 following the patient's surgery for rotator/bicep.  Approximately 2 to 3 weeks later he began to develop cellulitis.  The patient had severe swelling before that timeframe.  He was referred to a kidney doctor due to concern for possible worsening of chronic kidney disease and at that time his cellulitis began to get worse.  He also noticed that he has fluid was worse in his legs.  He was given several doses of IV Lasix at [facility] and transition to p.o. Lasix.  Patient is at instances of weeping and small shallow ulcerations.  He has tried medical grade compression however due to his extensive swelling he is unable to wear them.  He denies any fever or chills.  Currently he does not have cellulitis.  This is very frustrating for the patient.  He tries to elevate as much as possible to help with the swelling but it does not completely relieve it.   Review of Systems  Cardiovascular: Positive for leg swelling.  Skin: Positive for rash. Negative for wound.  All other systems reviewed and are negative.      Objective:   Physical Exam Vitals reviewed.  HENT:     Head: Normocephalic.  Cardiovascular:     Rate and Rhythm: Normal rate.  Pulmonary:     Effort: Pulmonary effort is normal.  Musculoskeletal:     Right lower leg: 3+ Pitting Edema present.     Left lower leg: 3+ Pitting Edema present.  Neurological:     Mental Status: He is alert and oriented to person, place, and time.  Psychiatric:        Mood and Affect: Mood normal.        Behavior: Behavior normal.        Thought Content: Thought content normal.        Judgment: Judgment normal.     BP (!) 170/73   Pulse 73   Ht 5\' 10"   (1.778 m)   Wt (!) 340 lb (154.2 kg)   BMI 48.78 kg/m   Past Medical History:  Diagnosis Date  . Asthma   . Bell's palsy    SEVERAL TIMES  . Bell's palsy   . Cancer ([place])    SKIN  . Diabetes mellitus without complication ([place])   . Dyspnea    DOE  . History of kidney stones   . Hyperlipemia   . Hypertension   . Kidney stone   . Neuropathy   . Obesity   . Sleep apnea    C PAP    Social History   Socioeconomic History  . Marital status: Married    Spouse name: Not on file  . Number of children: Not on file  . Years of education: Not on file  . Highest education level: Not on file  Occupational History  . Not on file  Tobacco Use  . Smoking status: Never Smoker  . Smokeless tobacco: Never Used  Vaping Use  . Vaping Use: Never used  Substance and Sexual Activity  . Alcohol use: No  . Drug use: Not Currently  . Sexual activity: Yes    Birth control/protection: None  Other Topics  Concern  . Not on file  Social History Narrative  . Not on file   Social Determinants of Health   Financial Resource Strain: Not on file  Food Insecurity: Not on file  Transportation Needs: Not on file  Physical Activity: Not on file  Stress: Not on file  Social Connections: Not on file  Intimate Partner Violence: Not on file    Past Surgical History:  Procedure Laterality Date  . APPENDECTOMY    . CATARACT EXTRACTION W/PHACO Left 05/20/2017   Procedure: CATARACT EXTRACTION PHACO AND INTRAOCULAR LENS PLACEMENT (IOC);  Surgeon: Birder Robson, MD;  Location: [facility];  Service: Ophthalmology;  Laterality: Left;  [place] 00:32.0 AP% 15.5 CDE 4.97 Fluid Pack Lot # [id number]  . CATARACT EXTRACTION W/PHACO Right 06/10/2017   Procedure: CATARACT EXTRACTION PHACO AND INTRAOCULAR LENS PLACEMENT ([place]);  Surgeon: Birder Robson, MD;  Location: [facility];  Service: Ophthalmology;  Laterality: Right;  [place]  00:51 AP% 15.4 CDE 7.95 Fluid pack lot # [id number]  . CHOLECYSTECTOMY    .  CYSTOSCOPY W/ RETROGRADES Right 08/13/2019   Procedure: CYSTOSCOPY WITH RETROGRADE PYELOGRAM;  Surgeon: Billey Co, MD;  Location: [facility];  Service: Urology;  Laterality: Right;  . CYSTOSCOPY WITH STENT PLACEMENT Right 07/26/2019   Procedure: CYSTOSCOPY WITH STENT PLACEMENT;  Surgeon: Lucas Mallow, MD;  Location: [facility];  Service: Urology;  Laterality: Right;  . CYSTOSCOPY/URETEROSCOPY/HOLMIUM LASER/STENT PLACEMENT Right 08/13/2019   Procedure: CYSTOSCOPY/URETEROSCOPY/HOLMIUM LASER/STENT EXCHANGE;  Surgeon: Billey Co, MD;  Location: [facility];  Service: Urology;  Laterality: Right;  . FRACTURE SURGERY     WRIST  . HAND SURGERY    . HERNIA REPAIR    . SHOULDER ARTHROSCOPY WITH SUBACROMIAL DECOMPRESSION AND OPEN ROTATOR C Right 09/11/2020   Procedure: Right shoulder arthroscopic rotator cuff repair vs Regeneten patch application, subacromial decompression, and biceps tenodesis - Reche Dixon to Assist;  Surgeon: Leim Fabry, MD;  Location: [facility];  Service: Orthopedics;  Laterality: Right;  . TONSILLECTOMY      Family History  Problem Relation Age of Onset  . Emphysema Mother   . COPD Mother   . Heart disease Mother   . Brain cancer Father     Allergies  Allergen Reactions  . Codeine Nausea And Vomiting  . Doxycycline   . Erythromycin Rash    CBC Latest Ref Rng & Units 09/11/2020 07/26/2019 08/12/2015  WBC 4.0 - 10.5 K/uL - 13.8(H) 12.3(H)  Hemoglobin 13.0 - 17.0 g/dL 11.6(L) 12.4(L) 13.8  Hematocrit 39.0 - 52.0 % 34.0(L) 38.7(L) 43.1  Platelets 150 - 400 K/uL - 326 283      CMP     Component Value Date/Time   NA 139 10/17/2020 1306   NA 137 11/27/2013 0412   K 3.6 10/17/2020 1306   K 3.5 11/27/2013 0412   CL 100 10/17/2020 1306   CL 105 11/27/2013 0412   CO2 27 10/17/2020 1306   CO2 25 11/27/2013 0412   GLUCOSE 119 (H) 10/17/2020 1306   GLUCOSE 98 11/27/2013 0412   BUN 79 (H) 10/17/2020 1306   BUN 42 (H) 11/27/2013 0412   CREATININE 2.35 (H)  10/17/2020 1306   CREATININE 2.14 (H) 11/27/2013 0412   CALCIUM 9.6 10/17/2020 1306   CALCIUM 7.6 (L) 11/27/2013 0412   PROT 7.2 03/13/2020 1531   PROT 7.4 11/27/2013 0412   ALBUMIN 3.4 (L) 10/17/2020 1306   ALBUMIN 3.1 (L) 11/27/2013 0412   AST 18 03/13/2020 1531   AST 47 (H)  11/27/2013 0412   ALT 14 03/13/2020 1531   ALT 41 11/27/2013 0412   ALKPHOS 65 03/13/2020 1531   ALKPHOS 82 11/27/2013 0412   BILITOT 0.5 03/13/2020 1531   BILITOT 0.4 11/27/2013 0412   GFRNONAA 27 (L) 10/17/2020 1306   GFRNONAA 30 (L) 11/27/2013 0412   GFRAA 30 (L) 03/13/2020 1531   GFRAA 34 (L) 11/27/2013 0412     No results found.     Assessment & Plan:   1. Bilateral leg edema I have had a long discussion with the patient regarding swelling and why it  causes symptoms.  Patient will begin wearing graduated compression stockings class 1 (20-30 mmHg) on a daily basis a prescription was given. The patient will  beginning wearing the stockings first thing in the morning and removing them in the evening. The patient is instructed specifically not to sleep in the stockings.  Currently due to the patient's swelling he does note that he has issues with using compression socks.  We offered to do [place] wraps for the patient however he did decline at this time.  We discussed [place] wraps as well.  In addition, behavioral modification will be initiated.  This will include frequent elevation, use of over the counter pain medications and exercise such as walking.  I have reviewed systemic causes for chronic edema such as liver, kidney and cardiac etiologies.  There is possible dysfunction within some of these systems.   Consideration for a lymph pump will also be made based upon the effectiveness of conservative therapy.  This would help to improve the edema control and prevent sequela such as ulcers and infections   Patient should undergo duplex ultrasound of the venous system to ensure that DVT or reflux is not  present.  The patient will follow-up with me after the ultrasound.    2. Acute systolic CHF (congestive heart failure) ([place]) This is a possible contributing factor to the patient's lower extremity edema, as is possible chronic kidney disease  3. Hypertension, unspecified type Continue antihypertensive medications as already ordered, these medications have been reviewed and there are no changes at this time.   4. Hyperlipidemia, unspecified hyperlipidemia type Continue statin as ordered and reviewed, no changes at this time    Current Outpatient Medications on File Prior to Visit  Medication Sig Dispense Refill  . acetaminophen (TYLENOL) 500 MG tablet Take 1,000 mg by mouth every 8 (eight) hours as needed for moderate pain.    Marland Kitchen acetaminophen (TYLENOL) 500 MG tablet Take 2 tablets (1,000 mg total) by mouth every 8 (eight) hours. 90 tablet 2  . albuterol (PROVENTIL HFA;VENTOLIN HFA) 108 (90 Base) MCG/ACT inhaler Inhale 2 puffs into the lungs every 6 (six) hours as needed for wheezing or shortness of breath.    Marland Kitchen amLODipine-olmesartan (AZOR) 5-40 MG tablet Take 1 tablet by mouth every morning.     . calcitRIOL (ROCALTROL) 0.25 MCG capsule Take 0.25 mcg by mouth daily.    . carboxymethylcellulose (REFRESH PLUS) 0.5 % SOLN Place 1 drop into both eyes daily as needed (eye irritation).    . cetirizine (ZYRTEC) 10 MG tablet Take 10 mg by mouth daily as needed for allergies.    . chlorthalidone (HYGROTON) 25 MG tablet Take 25 mg by mouth daily.    . fluticasone (FLONASE) 50 MCG/ACT nasal spray Place 2 sprays into both nostrils daily as needed for allergies.     . furosemide (LASIX) 40 MG tablet Take 40 mg by mouth 2 (two) times  a week.    Marland Kitchen GLOBAL INJECT EASE INSULIN SYR 31G X 5/16" 1 ML MISC 615 mg.     . glucose blood (ONETOUCH ULTRA) test strip TEST BLOOD SUGAR 4 TIMES DAILY    . guaiFENesin-dextromethorphan (ROBITUSSIN DM) 100-10 MG/5ML syrup Take 5 mLs by mouth every 4 (four) hours as  needed for cough.    . insulin aspart (NOVOLOG) 100 UNIT/ML injection Inject 30-80 Units into the skin See admin instructions. 50 units with breakfast, 30 units with lunch, and 80 units with supper    . insulin detemir (LEVEMIR) 100 UNIT/ML injection Inject 90 Units into the skin at bedtime.    . Lancets (ONETOUCH DELICA PLUS [id number]) MISC USE 1 LANCET 4 TIMES DAILY    . lovastatin (MEVACOR) 40 MG tablet Take 40 mg by mouth daily with supper.    . metoprolol succinate (TOPROL-XL) 25 MG 24 hr tablet Take 25 mg by mouth every morning.     . ondansetron (ZOFRAN ODT) 4 MG disintegrating tablet Take 1 tablet (4 mg total) by mouth every 8 (eight) hours as needed for nausea or vomiting. 20 tablet 0  . oxyCODONE (ROXICODONE) 5 MG immediate release tablet Take 1-2 tablets (5-10 mg total) by mouth every 4 (four) hours as needed (pain). 30 tablet 0  . potassium chloride SA (K-DUR,KLOR-CON) 20 MEQ tablet Take 20 mEq by mouth daily.    . traMADol (ULTRAM) 50 MG tablet Take 1-2 tablets (50-100 mg total) by mouth every 6 (six) hours as needed. 45 tablet 0   No current facility-administered medications on file prior to visit.    There are no Patient Instructions on file for this visit. No follow-ups on file.   Kris Hartmann, NP

## 2020-11-14 DIAGNOSIS — M6281 Muscle weakness (generalized): Secondary | ICD-10-CM | POA: Diagnosis not present

## 2020-11-14 DIAGNOSIS — M25611 Stiffness of right shoulder, not elsewhere classified: Secondary | ICD-10-CM | POA: Diagnosis not present

## 2020-11-14 DIAGNOSIS — M25511 Pain in right shoulder: Secondary | ICD-10-CM | POA: Diagnosis not present

## 2020-11-14 DIAGNOSIS — Z9889 Other specified postprocedural states: Secondary | ICD-10-CM | POA: Diagnosis not present

## 2020-11-15 ENCOUNTER — Other Ambulatory Visit (INDEPENDENT_AMBULATORY_CARE_PROVIDER_SITE_OTHER): Payer: Self-pay | Admitting: Nurse Practitioner

## 2020-11-15 DIAGNOSIS — R600 Localized edema: Secondary | ICD-10-CM

## 2020-11-16 DIAGNOSIS — M25511 Pain in right shoulder: Secondary | ICD-10-CM | POA: Diagnosis not present

## 2020-11-16 DIAGNOSIS — M25611 Stiffness of right shoulder, not elsewhere classified: Secondary | ICD-10-CM | POA: Diagnosis not present

## 2020-11-16 DIAGNOSIS — Z9889 Other specified postprocedural states: Secondary | ICD-10-CM | POA: Diagnosis not present

## 2020-11-17 ENCOUNTER — Ambulatory Visit (INDEPENDENT_AMBULATORY_CARE_PROVIDER_SITE_OTHER): Payer: PPO

## 2020-11-17 ENCOUNTER — Ambulatory Visit (INDEPENDENT_AMBULATORY_CARE_PROVIDER_SITE_OTHER): Payer: PPO | Admitting: Nurse Practitioner

## 2020-11-17 ENCOUNTER — Encounter (INDEPENDENT_AMBULATORY_CARE_PROVIDER_SITE_OTHER): Payer: Self-pay | Admitting: Nurse Practitioner

## 2020-11-17 ENCOUNTER — Other Ambulatory Visit: Payer: Self-pay

## 2020-11-17 VITALS — BP 172/71 | HR 84 | Resp 16 | Wt 341.6 lb

## 2020-11-17 DIAGNOSIS — R600 Localized edema: Secondary | ICD-10-CM

## 2020-11-17 DIAGNOSIS — I5021 Acute systolic (congestive) heart failure: Secondary | ICD-10-CM | POA: Diagnosis not present

## 2020-11-17 DIAGNOSIS — E785 Hyperlipidemia, unspecified: Secondary | ICD-10-CM

## 2020-11-17 DIAGNOSIS — I10 Essential (primary) hypertension: Secondary | ICD-10-CM

## 2020-11-19 ENCOUNTER — Encounter (INDEPENDENT_AMBULATORY_CARE_PROVIDER_SITE_OTHER): Payer: Self-pay | Admitting: Nurse Practitioner

## 2020-11-19 NOTE — Progress Notes (Signed)
Subjective:    Patient ID: James Clarke, male    DOB: October 18, 1940, 80 y.o.   MRN: [id number] Chief Complaint  Patient presents with  . Follow-up    Ultrasound follow up    James Clarke is an 80 year old male that presents today for concerns of bilateral lower extremity edema.  The swelling began around 09/11/2020 following his surgery for rotator/bicep.  About 2 to 3 weeks later he began to develop cellulitis and has had recurrent bouts of it.  Before that time frame he has severe swelling.  There was concern that he had worsening chronic kidney disease during the same time the cellulitis began to get worse.  He was given several doses of IV Lasix and then transition to p.o. Lasix.  Since that time the weeping and the ulcerations the patient previously had healed.  At the patient's last office visit we discussed use of compression wraps, elevation and activity as well as the difference between vascular causes of edema versus issues with other systems, such as cardiac, renal and hepatic.  Since the patient's last office visit he did obtain compression wraps and has had them for approximately a week.  He has been diligent wearing them daily.  It is noted that his lower extremity edema has improved.  The patient also notes that his legs feel better and he is able to wear these much better than standard compression socks.   Today noninvasive studies show no evidence of DVT or superficial thrombophlebitis bilaterally.  No evidence of deep venous insufficiency bilaterally.  No evidence of superficial venous reflux bilaterally.  No evidence of of reflux in the short or great saphenous veins.   Review of Systems  Cardiovascular: Positive for leg swelling.  Skin: Negative for wound.  All other systems reviewed and are negative.      Objective:   Physical Exam Vitals reviewed.  HENT:     Head: Normocephalic.  Cardiovascular:     Rate and Rhythm: Normal rate.     Pulses: Normal pulses.   Pulmonary:     Effort: Pulmonary effort is normal.  Musculoskeletal:     Right lower leg: 2+ Pitting Edema present.     Left lower leg: 2+ Pitting Edema present.  Neurological:     Mental Status: He is alert and oriented to person, place, and time.  Psychiatric:        Mood and Affect: Mood normal.        Behavior: Behavior normal.        Thought Content: Thought content normal.        Judgment: Judgment normal.     BP (!) 172/71 (BP Location: Left Arm)   Pulse 84   Resp 16   Wt (!) 341 lb 9.6 oz (154.9 kg)   BMI 49.01 kg/m   Past Medical History:  Diagnosis Date  . Asthma   . Bell's palsy    SEVERAL TIMES  . Bell's palsy   . Cancer ([place])    SKIN  . Diabetes mellitus without complication ([place])   . Dyspnea    DOE  . History of kidney stones   . Hyperlipemia   . Hypertension   . Kidney stone   . Neuropathy   . Obesity   . Sleep apnea    C PAP    Social History   Socioeconomic History  . Marital status: Married    Spouse name: Not on file  . Number of children: Not on file  .  Years of education: Not on file  . Highest education level: Not on file  Occupational History  . Not on file  Tobacco Use  . Smoking status: Never Smoker  . Smokeless tobacco: Never Used  Vaping Use  . Vaping Use: Never used  Substance and Sexual Activity  . Alcohol use: No  . Drug use: Not Currently  . Sexual activity: Yes    Birth control/protection: None  Other Topics Concern  . Not on file  Social History Narrative  . Not on file   Social Determinants of Health   Financial Resource Strain: Not on file  Food Insecurity: Not on file  Transportation Needs: Not on file  Physical Activity: Not on file  Stress: Not on file  Social Connections: Not on file  Intimate Partner Violence: Not on file    Past Surgical History:  Procedure Laterality Date  . APPENDECTOMY    . CATARACT EXTRACTION W/PHACO Left 05/20/2017   Procedure: CATARACT EXTRACTION PHACO AND INTRAOCULAR  LENS PLACEMENT (IOC);  Surgeon: Birder Robson, MD;  Location: [facility];  Service: Ophthalmology;  Laterality: Left;  [place] 00:32.0 AP% 15.5 CDE 4.97 Fluid Pack Lot # [id number]  . CATARACT EXTRACTION W/PHACO Right 06/10/2017   Procedure: CATARACT EXTRACTION PHACO AND INTRAOCULAR LENS PLACEMENT ([place]);  Surgeon: Birder Robson, MD;  Location: [facility];  Service: Ophthalmology;  Laterality: Right;  [place]  00:51 AP% 15.4 CDE 7.95 Fluid pack lot # [id number]  . CHOLECYSTECTOMY    . CYSTOSCOPY W/ RETROGRADES Right 08/13/2019   Procedure: CYSTOSCOPY WITH RETROGRADE PYELOGRAM;  Surgeon: Billey Co, MD;  Location: [facility];  Service: Urology;  Laterality: Right;  . CYSTOSCOPY WITH STENT PLACEMENT Right 07/26/2019   Procedure: CYSTOSCOPY WITH STENT PLACEMENT;  Surgeon: Lucas Mallow, MD;  Location: [facility];  Service: Urology;  Laterality: Right;  . CYSTOSCOPY/URETEROSCOPY/HOLMIUM LASER/STENT PLACEMENT Right 08/13/2019   Procedure: CYSTOSCOPY/URETEROSCOPY/HOLMIUM LASER/STENT EXCHANGE;  Surgeon: Billey Co, MD;  Location: [facility];  Service: Urology;  Laterality: Right;  . FRACTURE SURGERY     WRIST  . HAND SURGERY    . HERNIA REPAIR    . SHOULDER ARTHROSCOPY WITH SUBACROMIAL DECOMPRESSION AND OPEN ROTATOR C Right 09/11/2020   Procedure: Right shoulder arthroscopic rotator cuff repair vs Regeneten patch application, subacromial decompression, and biceps tenodesis - Reche Dixon to Assist;  Surgeon: Leim Fabry, MD;  Location: [facility];  Service: Orthopedics;  Laterality: Right;  . TONSILLECTOMY      Family History  Problem Relation Age of Onset  . Emphysema Mother   . COPD Mother   . Heart disease Mother   . Brain cancer Father     Allergies  Allergen Reactions  . Codeine Nausea And Vomiting  . Doxycycline   . Erythromycin Rash    CBC Latest Ref Rng & Units 09/11/2020 07/26/2019 08/12/2015  WBC 4.0 - 10.5 K/uL - 13.8(H) 12.3(H)  Hemoglobin 13.0 - 17.0 g/dL 11.6(L) 12.4(L) 13.8   Hematocrit 39.0 - 52.0 % 34.0(L) 38.7(L) 43.1  Platelets 150 - 400 K/uL - 326 283      CMP     Component Value Date/Time   NA 139 10/17/2020 1306   NA 137 11/27/2013 0412   K 3.6 10/17/2020 1306   K 3.5 11/27/2013 0412   CL 100 10/17/2020 1306   CL 105 11/27/2013 0412   CO2 27 10/17/2020 1306   CO2 25 11/27/2013 0412   GLUCOSE 119 (H) 10/17/2020 1306   GLUCOSE 98 11/27/2013 0412  BUN 79 (H) 10/17/2020 1306   BUN 42 (H) 11/27/2013 0412   CREATININE 2.35 (H) 10/17/2020 1306   CREATININE 2.14 (H) 11/27/2013 0412   CALCIUM 9.6 10/17/2020 1306   CALCIUM 7.6 (L) 11/27/2013 0412   PROT 7.2 03/13/2020 1531   PROT 7.4 11/27/2013 0412   ALBUMIN 3.4 (L) 10/17/2020 1306   ALBUMIN 3.1 (L) 11/27/2013 0412   AST 18 03/13/2020 1531   AST 47 (H) 11/27/2013 0412   ALT 14 03/13/2020 1531   ALT 41 11/27/2013 0412   ALKPHOS 65 03/13/2020 1531   ALKPHOS 82 11/27/2013 0412   BILITOT 0.5 03/13/2020 1531   BILITOT 0.4 11/27/2013 0412   GFRNONAA 27 (L) 10/17/2020 1306   GFRNONAA 30 (L) 11/27/2013 0412   GFRAA 30 (L) 03/13/2020 1531   GFRAA 34 (L) 11/27/2013 0412     No results found.     Assessment & Plan:   1. Lower extremity edema Since last office visit the patient's lower extremity edema has improved.  He has obtained some compression wraps and has been wearing them daily.  In fact it is only been a week but there is noticeable improvement.  Prior to moving forward with any sort of lymphedema pump, we will continue to see how the patient reacts to the conservative treatment such as compression, elevation and exercise.  The patient also has upcoming visits with nephrology and cardiology and medication adjustments may also be helpful with relieving some of the edema.  We will have the patient return in 3 months to determine progress with lower extremity edema as well as next steps if necessary.  2. Acute systolic CHF (congestive heart failure) ([place]) The patient has an upcoming  appointment with cardiology soon to determine if there is a cardiac component to the patient's lower extremity edema.  3. Hypertension, unspecified type Continue antihypertensive medications as already ordered, these medications have been reviewed and there are no changes at this time.   4. Hyperlipidemia, unspecified hyperlipidemia type Continue statin as ordered and reviewed, no changes at this time    Current Outpatient Medications on File Prior to Visit  Medication Sig Dispense Refill  . acetaminophen (TYLENOL) 500 MG tablet Take 1,000 mg by mouth every 8 (eight) hours as needed for moderate pain.    Marland Kitchen acetaminophen (TYLENOL) 500 MG tablet Take 2 tablets (1,000 mg total) by mouth every 8 (eight) hours. 90 tablet 2  . albuterol (PROVENTIL HFA;VENTOLIN HFA) 108 (90 Base) MCG/ACT inhaler Inhale 2 puffs into the lungs every 6 (six) hours as needed for wheezing or shortness of breath.    Marland Kitchen amLODipine-olmesartan (AZOR) 5-40 MG tablet Take 1 tablet by mouth every morning.     . calcitRIOL (ROCALTROL) 0.25 MCG capsule Take 0.25 mcg by mouth daily.    . carboxymethylcellulose (REFRESH PLUS) 0.5 % SOLN Place 1 drop into both eyes daily as needed (eye irritation).    . cetirizine (ZYRTEC) 10 MG tablet Take 10 mg by mouth daily as needed for allergies.    . chlorthalidone (HYGROTON) 25 MG tablet Take 25 mg by mouth daily.    . fluticasone (FLONASE) 50 MCG/ACT nasal spray Place 2 sprays into both nostrils daily as needed for allergies.     . furosemide (LASIX) 40 MG tablet Take 40 mg by mouth 2 (two) times a week.    Marland Kitchen GLOBAL INJECT EASE INSULIN SYR 31G X 5/16" 1 ML MISC 615 mg.     . glucose blood (ONETOUCH ULTRA) test strip TEST  BLOOD SUGAR 4 TIMES DAILY    . guaiFENesin-dextromethorphan (ROBITUSSIN DM) 100-10 MG/5ML syrup Take 5 mLs by mouth every 4 (four) hours as needed for cough.    . insulin aspart (NOVOLOG) 100 UNIT/ML injection Inject 30-80 Units into the skin See admin instructions. 50  units with breakfast, 30 units with lunch, and 80 units with supper    . insulin detemir (LEVEMIR) 100 UNIT/ML injection Inject 90 Units into the skin at bedtime.    . Lancets (ONETOUCH DELICA PLUS [id number]) MISC USE 1 LANCET 4 TIMES DAILY    . lovastatin (MEVACOR) 40 MG tablet Take 40 mg by mouth daily with supper.    . metoprolol succinate (TOPROL-XL) 25 MG 24 hr tablet Take 25 mg by mouth every morning.     . ondansetron (ZOFRAN ODT) 4 MG disintegrating tablet Take 1 tablet (4 mg total) by mouth every 8 (eight) hours as needed for nausea or vomiting. 20 tablet 0  . potassium chloride SA (K-DUR,KLOR-CON) 20 MEQ tablet Take 20 mEq by mouth daily.    Marland Kitchen oxyCODONE (ROXICODONE) 5 MG immediate release tablet Take 1-2 tablets (5-10 mg total) by mouth every 4 (four) hours as needed (pain). (Patient not taking: Reported on 11/17/2020) 30 tablet 0  . traMADol (ULTRAM) 50 MG tablet Take 1-2 tablets (50-100 mg total) by mouth every 6 (six) hours as needed. (Patient not taking: Reported on 11/17/2020) 45 tablet 0   No current facility-administered medications on file prior to visit.    There are no Patient Instructions on file for this visit. No follow-ups on file.   Kris Hartmann, NP

## 2020-11-20 DIAGNOSIS — Z9889 Other specified postprocedural states: Secondary | ICD-10-CM | POA: Diagnosis not present

## 2020-11-20 DIAGNOSIS — R0600 Dyspnea, unspecified: Secondary | ICD-10-CM | POA: Diagnosis not present

## 2020-11-20 DIAGNOSIS — R600 Localized edema: Secondary | ICD-10-CM | POA: Diagnosis not present

## 2020-11-20 DIAGNOSIS — M6281 Muscle weakness (generalized): Secondary | ICD-10-CM | POA: Diagnosis not present

## 2020-11-20 DIAGNOSIS — M25511 Pain in right shoulder: Secondary | ICD-10-CM | POA: Diagnosis not present

## 2020-11-20 DIAGNOSIS — E1140 Type 2 diabetes mellitus with diabetic neuropathy, unspecified: Secondary | ICD-10-CM | POA: Diagnosis not present

## 2020-11-20 DIAGNOSIS — L03115 Cellulitis of right lower limb: Secondary | ICD-10-CM | POA: Diagnosis not present

## 2020-11-20 DIAGNOSIS — M25611 Stiffness of right shoulder, not elsewhere classified: Secondary | ICD-10-CM | POA: Diagnosis not present

## 2020-11-23 DIAGNOSIS — M25611 Stiffness of right shoulder, not elsewhere classified: Secondary | ICD-10-CM | POA: Diagnosis not present

## 2020-11-23 DIAGNOSIS — Z9889 Other specified postprocedural states: Secondary | ICD-10-CM | POA: Diagnosis not present

## 2020-11-23 DIAGNOSIS — M25511 Pain in right shoulder: Secondary | ICD-10-CM | POA: Diagnosis not present

## 2020-11-30 DIAGNOSIS — M25611 Stiffness of right shoulder, not elsewhere classified: Secondary | ICD-10-CM | POA: Diagnosis not present

## 2020-11-30 DIAGNOSIS — M25511 Pain in right shoulder: Secondary | ICD-10-CM | POA: Diagnosis not present

## 2020-11-30 DIAGNOSIS — Z9889 Other specified postprocedural states: Secondary | ICD-10-CM | POA: Diagnosis not present

## 2020-12-01 DIAGNOSIS — Z794 Long term (current) use of insulin: Secondary | ICD-10-CM | POA: Diagnosis not present

## 2020-12-01 DIAGNOSIS — E1121 Type 2 diabetes mellitus with diabetic nephropathy: Secondary | ICD-10-CM | POA: Diagnosis not present

## 2020-12-04 DIAGNOSIS — I5021 Acute systolic (congestive) heart failure: Secondary | ICD-10-CM | POA: Diagnosis not present

## 2020-12-04 DIAGNOSIS — M25511 Pain in right shoulder: Secondary | ICD-10-CM | POA: Diagnosis not present

## 2020-12-04 DIAGNOSIS — M6281 Muscle weakness (generalized): Secondary | ICD-10-CM | POA: Diagnosis not present

## 2020-12-04 DIAGNOSIS — M25611 Stiffness of right shoulder, not elsewhere classified: Secondary | ICD-10-CM | POA: Diagnosis not present

## 2020-12-04 DIAGNOSIS — Z9889 Other specified postprocedural states: Secondary | ICD-10-CM | POA: Diagnosis not present

## 2020-12-06 DIAGNOSIS — M25611 Stiffness of right shoulder, not elsewhere classified: Secondary | ICD-10-CM | POA: Diagnosis not present

## 2020-12-06 DIAGNOSIS — Z9889 Other specified postprocedural states: Secondary | ICD-10-CM | POA: Diagnosis not present

## 2020-12-06 DIAGNOSIS — M6281 Muscle weakness (generalized): Secondary | ICD-10-CM | POA: Diagnosis not present

## 2020-12-06 DIAGNOSIS — M25511 Pain in right shoulder: Secondary | ICD-10-CM | POA: Diagnosis not present

## 2020-12-08 DIAGNOSIS — E1129 Type 2 diabetes mellitus with other diabetic kidney complication: Secondary | ICD-10-CM | POA: Diagnosis not present

## 2020-12-08 DIAGNOSIS — E1122 Type 2 diabetes mellitus with diabetic chronic kidney disease: Secondary | ICD-10-CM | POA: Diagnosis not present

## 2020-12-08 DIAGNOSIS — N1832 Chronic kidney disease, stage 3b: Secondary | ICD-10-CM | POA: Diagnosis not present

## 2020-12-08 DIAGNOSIS — R809 Proteinuria, unspecified: Secondary | ICD-10-CM | POA: Diagnosis not present

## 2020-12-08 DIAGNOSIS — I10 Essential (primary) hypertension: Secondary | ICD-10-CM | POA: Diagnosis not present

## 2020-12-08 DIAGNOSIS — E1142 Type 2 diabetes mellitus with diabetic polyneuropathy: Secondary | ICD-10-CM | POA: Diagnosis not present

## 2020-12-08 DIAGNOSIS — E1121 Type 2 diabetes mellitus with diabetic nephropathy: Secondary | ICD-10-CM | POA: Diagnosis not present

## 2020-12-08 DIAGNOSIS — E669 Obesity, unspecified: Secondary | ICD-10-CM | POA: Diagnosis not present

## 2020-12-08 DIAGNOSIS — E1169 Type 2 diabetes mellitus with other specified complication: Secondary | ICD-10-CM | POA: Diagnosis not present

## 2020-12-08 DIAGNOSIS — Z794 Long term (current) use of insulin: Secondary | ICD-10-CM | POA: Diagnosis not present

## 2020-12-11 DIAGNOSIS — M25511 Pain in right shoulder: Secondary | ICD-10-CM | POA: Diagnosis not present

## 2020-12-11 DIAGNOSIS — Z9889 Other specified postprocedural states: Secondary | ICD-10-CM | POA: Diagnosis not present

## 2020-12-11 DIAGNOSIS — M25611 Stiffness of right shoulder, not elsewhere classified: Secondary | ICD-10-CM | POA: Diagnosis not present

## 2020-12-13 DIAGNOSIS — M25611 Stiffness of right shoulder, not elsewhere classified: Secondary | ICD-10-CM | POA: Diagnosis not present

## 2020-12-13 DIAGNOSIS — Z9889 Other specified postprocedural states: Secondary | ICD-10-CM | POA: Diagnosis not present

## 2020-12-13 DIAGNOSIS — M6281 Muscle weakness (generalized): Secondary | ICD-10-CM | POA: Diagnosis not present

## 2020-12-13 DIAGNOSIS — M25511 Pain in right shoulder: Secondary | ICD-10-CM | POA: Diagnosis not present

## 2020-12-15 DIAGNOSIS — L03115 Cellulitis of right lower limb: Secondary | ICD-10-CM | POA: Diagnosis not present

## 2020-12-19 DIAGNOSIS — M6281 Muscle weakness (generalized): Secondary | ICD-10-CM | POA: Diagnosis not present

## 2020-12-19 DIAGNOSIS — M25611 Stiffness of right shoulder, not elsewhere classified: Secondary | ICD-10-CM | POA: Diagnosis not present

## 2020-12-19 DIAGNOSIS — M25511 Pain in right shoulder: Secondary | ICD-10-CM | POA: Diagnosis not present

## 2020-12-19 DIAGNOSIS — E7800 Pure hypercholesterolemia, unspecified: Secondary | ICD-10-CM | POA: Diagnosis not present

## 2020-12-19 DIAGNOSIS — G4733 Obstructive sleep apnea (adult) (pediatric): Secondary | ICD-10-CM | POA: Diagnosis not present

## 2020-12-19 DIAGNOSIS — I700 Atherosclerosis of aorta: Secondary | ICD-10-CM | POA: Diagnosis not present

## 2020-12-19 DIAGNOSIS — E1142 Type 2 diabetes mellitus with diabetic polyneuropathy: Secondary | ICD-10-CM | POA: Diagnosis not present

## 2020-12-19 DIAGNOSIS — R600 Localized edema: Secondary | ICD-10-CM | POA: Diagnosis not present

## 2020-12-19 DIAGNOSIS — Z9889 Other specified postprocedural states: Secondary | ICD-10-CM | POA: Diagnosis not present

## 2020-12-19 DIAGNOSIS — I10 Essential (primary) hypertension: Secondary | ICD-10-CM | POA: Diagnosis not present

## 2020-12-21 ENCOUNTER — Encounter (INDEPENDENT_AMBULATORY_CARE_PROVIDER_SITE_OTHER): Payer: Self-pay

## 2020-12-21 DIAGNOSIS — Z9889 Other specified postprocedural states: Secondary | ICD-10-CM | POA: Diagnosis not present

## 2020-12-21 DIAGNOSIS — N184 Chronic kidney disease, stage 4 (severe): Secondary | ICD-10-CM | POA: Diagnosis not present

## 2020-12-21 DIAGNOSIS — M25511 Pain in right shoulder: Secondary | ICD-10-CM | POA: Diagnosis not present

## 2020-12-21 DIAGNOSIS — I10 Essential (primary) hypertension: Secondary | ICD-10-CM | POA: Diagnosis not present

## 2020-12-21 DIAGNOSIS — M6281 Muscle weakness (generalized): Secondary | ICD-10-CM | POA: Diagnosis not present

## 2020-12-21 DIAGNOSIS — E1122 Type 2 diabetes mellitus with diabetic chronic kidney disease: Secondary | ICD-10-CM | POA: Diagnosis not present

## 2020-12-21 DIAGNOSIS — D631 Anemia in chronic kidney disease: Secondary | ICD-10-CM | POA: Diagnosis not present

## 2020-12-21 DIAGNOSIS — M25611 Stiffness of right shoulder, not elsewhere classified: Secondary | ICD-10-CM | POA: Diagnosis not present

## 2020-12-28 DIAGNOSIS — R059 Cough, unspecified: Secondary | ICD-10-CM | POA: Diagnosis not present

## 2020-12-28 DIAGNOSIS — I10 Essential (primary) hypertension: Secondary | ICD-10-CM | POA: Diagnosis not present

## 2020-12-28 DIAGNOSIS — N184 Chronic kidney disease, stage 4 (severe): Secondary | ICD-10-CM | POA: Diagnosis not present

## 2020-12-28 DIAGNOSIS — J40 Bronchitis, not specified as acute or chronic: Secondary | ICD-10-CM | POA: Diagnosis not present

## 2020-12-28 DIAGNOSIS — I517 Cardiomegaly: Secondary | ICD-10-CM | POA: Diagnosis not present

## 2021-01-04 DIAGNOSIS — Z6841 Body Mass Index (BMI) 40.0 and over, adult: Secondary | ICD-10-CM | POA: Diagnosis not present

## 2021-01-04 DIAGNOSIS — J301 Allergic rhinitis due to pollen: Secondary | ICD-10-CM | POA: Diagnosis not present

## 2021-01-04 DIAGNOSIS — E7800 Pure hypercholesterolemia, unspecified: Secondary | ICD-10-CM | POA: Diagnosis not present

## 2021-01-04 DIAGNOSIS — N184 Chronic kidney disease, stage 4 (severe): Secondary | ICD-10-CM | POA: Diagnosis not present

## 2021-01-04 DIAGNOSIS — G4733 Obstructive sleep apnea (adult) (pediatric): Secondary | ICD-10-CM | POA: Diagnosis not present

## 2021-01-04 DIAGNOSIS — E1140 Type 2 diabetes mellitus with diabetic neuropathy, unspecified: Secondary | ICD-10-CM | POA: Diagnosis not present

## 2021-01-04 DIAGNOSIS — I10 Essential (primary) hypertension: Secondary | ICD-10-CM | POA: Diagnosis not present

## 2021-01-04 DIAGNOSIS — R202 Paresthesia of skin: Secondary | ICD-10-CM | POA: Diagnosis not present

## 2021-01-04 DIAGNOSIS — E1122 Type 2 diabetes mellitus with diabetic chronic kidney disease: Secondary | ICD-10-CM | POA: Diagnosis not present

## 2021-01-04 DIAGNOSIS — Z794 Long term (current) use of insulin: Secondary | ICD-10-CM | POA: Diagnosis not present

## 2021-01-05 DIAGNOSIS — M25611 Stiffness of right shoulder, not elsewhere classified: Secondary | ICD-10-CM | POA: Diagnosis not present

## 2021-01-05 DIAGNOSIS — Z9889 Other specified postprocedural states: Secondary | ICD-10-CM | POA: Diagnosis not present

## 2021-01-05 DIAGNOSIS — M6281 Muscle weakness (generalized): Secondary | ICD-10-CM | POA: Diagnosis not present

## 2021-01-05 DIAGNOSIS — M25511 Pain in right shoulder: Secondary | ICD-10-CM | POA: Diagnosis not present

## 2021-01-09 DIAGNOSIS — Z86018 Personal history of other benign neoplasm: Secondary | ICD-10-CM | POA: Diagnosis not present

## 2021-01-09 DIAGNOSIS — I872 Venous insufficiency (chronic) (peripheral): Secondary | ICD-10-CM | POA: Diagnosis not present

## 2021-01-09 DIAGNOSIS — M25511 Pain in right shoulder: Secondary | ICD-10-CM | POA: Diagnosis not present

## 2021-01-09 DIAGNOSIS — Z872 Personal history of diseases of the skin and subcutaneous tissue: Secondary | ICD-10-CM | POA: Diagnosis not present

## 2021-01-09 DIAGNOSIS — L570 Actinic keratosis: Secondary | ICD-10-CM | POA: Diagnosis not present

## 2021-01-09 DIAGNOSIS — L821 Other seborrheic keratosis: Secondary | ICD-10-CM | POA: Diagnosis not present

## 2021-01-12 DIAGNOSIS — Z9889 Other specified postprocedural states: Secondary | ICD-10-CM | POA: Diagnosis not present

## 2021-01-12 DIAGNOSIS — M25611 Stiffness of right shoulder, not elsewhere classified: Secondary | ICD-10-CM | POA: Diagnosis not present

## 2021-01-12 DIAGNOSIS — M6281 Muscle weakness (generalized): Secondary | ICD-10-CM | POA: Diagnosis not present

## 2021-01-12 DIAGNOSIS — M25511 Pain in right shoulder: Secondary | ICD-10-CM | POA: Diagnosis not present

## 2021-01-16 DIAGNOSIS — M25611 Stiffness of right shoulder, not elsewhere classified: Secondary | ICD-10-CM | POA: Diagnosis not present

## 2021-01-16 DIAGNOSIS — M6281 Muscle weakness (generalized): Secondary | ICD-10-CM | POA: Diagnosis not present

## 2021-01-16 DIAGNOSIS — Z9889 Other specified postprocedural states: Secondary | ICD-10-CM | POA: Diagnosis not present

## 2021-01-16 DIAGNOSIS — M25511 Pain in right shoulder: Secondary | ICD-10-CM | POA: Diagnosis not present

## 2021-01-17 ENCOUNTER — Encounter (INDEPENDENT_AMBULATORY_CARE_PROVIDER_SITE_OTHER): Payer: Self-pay

## 2021-01-19 DIAGNOSIS — M6281 Muscle weakness (generalized): Secondary | ICD-10-CM | POA: Diagnosis not present

## 2021-01-19 DIAGNOSIS — M25511 Pain in right shoulder: Secondary | ICD-10-CM | POA: Diagnosis not present

## 2021-01-19 DIAGNOSIS — Z9889 Other specified postprocedural states: Secondary | ICD-10-CM | POA: Diagnosis not present

## 2021-01-19 DIAGNOSIS — M25611 Stiffness of right shoulder, not elsewhere classified: Secondary | ICD-10-CM | POA: Diagnosis not present

## 2021-01-23 DIAGNOSIS — M25311 Other instability, right shoulder: Secondary | ICD-10-CM | POA: Diagnosis not present

## 2021-01-24 DIAGNOSIS — Z9889 Other specified postprocedural states: Secondary | ICD-10-CM | POA: Diagnosis not present

## 2021-01-24 DIAGNOSIS — M6281 Muscle weakness (generalized): Secondary | ICD-10-CM | POA: Diagnosis not present

## 2021-01-24 DIAGNOSIS — M25611 Stiffness of right shoulder, not elsewhere classified: Secondary | ICD-10-CM | POA: Diagnosis not present

## 2021-01-24 DIAGNOSIS — M25511 Pain in right shoulder: Secondary | ICD-10-CM | POA: Diagnosis not present

## 2021-01-25 ENCOUNTER — Other Ambulatory Visit: Payer: Self-pay

## 2021-01-25 ENCOUNTER — Ambulatory Visit (INDEPENDENT_AMBULATORY_CARE_PROVIDER_SITE_OTHER): Payer: PPO | Admitting: Nurse Practitioner

## 2021-01-25 ENCOUNTER — Encounter (INDEPENDENT_AMBULATORY_CARE_PROVIDER_SITE_OTHER): Payer: Self-pay | Admitting: Nurse Practitioner

## 2021-01-25 VITALS — BP 145/81 | HR 72 | Resp 16 | Ht 70.0 in | Wt 329.8 lb

## 2021-01-25 DIAGNOSIS — I10 Essential (primary) hypertension: Secondary | ICD-10-CM

## 2021-01-25 DIAGNOSIS — I890 Lymphedema, not elsewhere classified: Secondary | ICD-10-CM | POA: Diagnosis not present

## 2021-01-25 DIAGNOSIS — I5021 Acute systolic (congestive) heart failure: Secondary | ICD-10-CM

## 2021-01-28 ENCOUNTER — Encounter (INDEPENDENT_AMBULATORY_CARE_PROVIDER_SITE_OTHER): Payer: Self-pay | Admitting: Nurse Practitioner

## 2021-01-28 NOTE — Progress Notes (Signed)
Subjective:    Patient ID: James Clarke, male    DOB: [birth year], 80 y.o.   MRN: [id number] Chief Complaint  Patient presents with   Follow-up    3 month follow up    James Clarke is an 80 year old man that presents today for evaluation of his compression garments.  The patient previously had getting excellent control of his swelling with the use of [place] wraps.  However recently he noticed that the [facility] wraps have become loose and have started to slide down.  Because of this, his swelling has not been as well-controlled as previously.  He denies any wounds or ulcerations.  He denies any fevers or chills.   Review of Systems  Cardiovascular:  Positive for leg swelling.  All other systems reviewed and are negative.     Objective:   Physical Exam Vitals reviewed.  HENT:     Head: Normocephalic.  Cardiovascular:     Rate and Rhythm: Normal rate.  Pulmonary:     Effort: Pulmonary effort is normal.  Musculoskeletal:     Right lower leg: 2+ Edema present.     Left lower leg: 2+ Edema present.  Neurological:     Mental Status: He is alert and oriented to person, place, and time.  Psychiatric:        Mood and Affect: Mood normal.        Behavior: Behavior normal.        Thought Content: Thought content normal.        Judgment: Judgment normal.    BP (!) 145/81 (BP Location: Right Arm)   Pulse 72   Resp 16   Ht 5\' 10"  (1.778 m)   Wt (!) 329 lb 12.8 oz (149.6 kg)   BMI 47.32 kg/m   Past Medical History:  Diagnosis Date   Asthma    Bell's palsy    SEVERAL TIMES   Bell's palsy    Cancer ([facility])    SKIN   Diabetes mellitus without complication ([facility])    Dyspnea    DOE   History of kidney stones    Hyperlipemia    Hypertension    Kidney stone    Neuropathy    Obesity    Sleep apnea    C PAP    Social History   Socioeconomic History   Marital status: Married    Spouse name: Not on file   Number of children: Not on file   Years of education: Not on file    Highest education level: Not on file  Occupational History   Not on file  Tobacco Use   Smoking status: Never   Smokeless tobacco: Never  Vaping Use   Vaping Use: Never used  Substance and Sexual Activity   Alcohol use: No   Drug use: Not Currently   Sexual activity: Yes    Birth control/protection: None  Other Topics Concern   Not on file  Social History Narrative   Not on file   Social Determinants of Health   Financial Resource Strain: Not on file  Food Insecurity: Not on file  Transportation Needs: Not on file  Physical Activity: Not on file  Stress: Not on file  Social Connections: Not on file  Intimate Partner Violence: Not on file    Past Surgical History:  Procedure Laterality Date   APPENDECTOMY     CATARACT EXTRACTION W/PHACO Left 05/20/2017   Procedure: CATARACT EXTRACTION PHACO AND INTRAOCULAR LENS PLACEMENT ([place]);  Surgeon: Birder Robson,  MD;  Location: [facility];  Service: Ophthalmology;  Laterality: Left;  [place] 00:32.0 AP% 15.5 CDE 4.97 Fluid Pack Lot # [id number]   CATARACT EXTRACTION W/PHACO Right 06/10/2017   Procedure: CATARACT EXTRACTION PHACO AND INTRAOCULAR LENS PLACEMENT ([facility]);  Surgeon: Birder Robson, MD;  Location: [facility];  Service: Ophthalmology;  Laterality: Right;  [place]  00:51 AP% 15.4 CDE 7.95 Fluid pack lot # [id number]   CHOLECYSTECTOMY     CYSTOSCOPY W/ RETROGRADES Right 08/13/2019   Procedure: CYSTOSCOPY WITH RETROGRADE PYELOGRAM;  Surgeon: Billey Co, MD;  Location: [facility];  Service: Urology;  Laterality: Right;   CYSTOSCOPY WITH STENT PLACEMENT Right 07/26/2019   Procedure: CYSTOSCOPY WITH STENT PLACEMENT;  Surgeon: Lucas Mallow, MD;  Location: [facility];  Service: Urology;  Laterality: Right;   CYSTOSCOPY/URETEROSCOPY/HOLMIUM LASER/STENT PLACEMENT Right 08/13/2019   Procedure: CYSTOSCOPY/URETEROSCOPY/HOLMIUM LASER/STENT EXCHANGE;  Surgeon: Billey Co, MD;  Location: [facility];  Service: Urology;  Laterality: Right;    FRACTURE SURGERY     WRIST   HAND SURGERY     HERNIA REPAIR     SHOULDER ARTHROSCOPY WITH SUBACROMIAL DECOMPRESSION AND OPEN ROTATOR C Right 09/11/2020   Procedure: Right shoulder arthroscopic rotator cuff repair vs Regeneten patch application, subacromial decompression, and biceps tenodesis - Reche Dixon to Assist;  Surgeon: Leim Fabry, MD;  Location: [facility];  Service: Orthopedics;  Laterality: Right;   TONSILLECTOMY      Family History  Problem Relation Age of Onset   Emphysema Mother    COPD Mother    Heart disease Mother    Brain cancer Father     Allergies  Allergen Reactions   Codeine Nausea And Vomiting   Doxycycline    Erythromycin Rash    CBC Latest Ref Rng & Units 09/11/2020 07/26/2019 08/12/2015  WBC 4.0 - 10.5 K/uL - 13.8(H) 12.3(H)  Hemoglobin 13.0 - 17.0 g/dL 11.6(L) 12.4(L) 13.8  Hematocrit 39.0 - 52.0 % 34.0(L) 38.7(L) 43.1  Platelets 150 - 400 K/uL - 326 283      CMP     Component Value Date/Time   NA 139 10/17/2020 1306   NA 137 11/27/2013 0412   K 3.6 10/17/2020 1306   K 3.5 11/27/2013 0412   CL 100 10/17/2020 1306   CL 105 11/27/2013 0412   CO2 27 10/17/2020 1306   CO2 25 11/27/2013 0412   GLUCOSE 119 (H) 10/17/2020 1306   GLUCOSE 98 11/27/2013 0412   BUN 79 (H) 10/17/2020 1306   BUN 42 (H) 11/27/2013 0412   CREATININE 2.35 (H) 10/17/2020 1306   CREATININE 2.14 (H) 11/27/2013 0412   CALCIUM 9.6 10/17/2020 1306   CALCIUM 7.6 (L) 11/27/2013 0412   PROT 7.2 03/13/2020 1531   PROT 7.4 11/27/2013 0412   ALBUMIN 3.4 (L) 10/17/2020 1306   ALBUMIN 3.1 (L) 11/27/2013 0412   AST 18 03/13/2020 1531   AST 47 (H) 11/27/2013 0412   ALT 14 03/13/2020 1531   ALT 41 11/27/2013 0412   ALKPHOS 65 03/13/2020 1531   ALKPHOS 82 11/27/2013 0412   BILITOT 0.5 03/13/2020 1531   BILITOT 0.4 11/27/2013 0412   GFRNONAA 27 (L) 10/17/2020 1306   GFRNONAA 30 (L) 11/27/2013 0412   GFRAA 30 (L) 03/13/2020 1531   GFRAA 34 (L) 11/27/2013 0412     No results  found.     Assessment & Plan:   1. Lymphedema Had a discussion with the patient and his wife to discuss possible options.  One possible option is  transitioning from compression wraps to compression socks.  However this does increase the difficulty of placing them exponentially.  While the compression wraps are more expensive it is wholly possible that the patient's swelling decreased enough to make his current compression wraps too large.  We also discussed the use of lymphedema pump.  At this time the patient wishes to replace his Wallie Char wraps to see if this will continue to help with his control of his lymphedema.  Patient will also continue other conservative tactics such as elevation.  Activity is difficult for the patient as he becomes winded easily.  We will have the patient return in 3 months with noninvasive studies.  2. Acute systolic CHF (congestive heart failure) ([place]) The patient does have heart failure.  If the swelling continues despite using a new Farrow wraps adjustment of his diuretics may be necessary.  3. Hypertension, unspecified type Continue antihypertensive medications as already ordered, these medications have been reviewed and there are no changes at this time.    Current Outpatient Medications on File Prior to Visit  Medication Sig Dispense Refill   acetaminophen (TYLENOL) 500 MG tablet Take 1,000 mg by mouth every 8 (eight) hours as needed for moderate pain.     acetaminophen (TYLENOL) 500 MG tablet Take 2 tablets (1,000 mg total) by mouth every 8 (eight) hours. 90 tablet 2   albuterol (PROVENTIL HFA;VENTOLIN HFA) 108 (90 Base) MCG/ACT inhaler Inhale 2 puffs into the lungs every 6 (six) hours as needed for wheezing or shortness of breath.     amLODipine-olmesartan (AZOR) 5-40 MG tablet Take 1 tablet by mouth every morning.      calcitRIOL (ROCALTROL) 0.25 MCG capsule Take 0.25 mcg by mouth daily.     carboxymethylcellulose (REFRESH PLUS) 0.5 % SOLN Place 1 drop into  both eyes daily as needed (eye irritation).     cetirizine (ZYRTEC) 10 MG tablet Take 10 mg by mouth daily as needed for allergies.     chlorthalidone (HYGROTON) 25 MG tablet Take 25 mg by mouth daily.     fluticasone (FLONASE) 50 MCG/ACT nasal spray Place 2 sprays into both nostrils daily as needed for allergies.      furosemide (LASIX) 40 MG tablet Take 40 mg by mouth 2 (two) times a week.     GLOBAL INJECT EASE INSULIN SYR 31G X 5/16" 1 ML MISC 615 mg.      glucose blood (ONETOUCH ULTRA) test strip TEST BLOOD SUGAR 4 TIMES DAILY     guaiFENesin-dextromethorphan (ROBITUSSIN DM) 100-10 MG/5ML syrup Take 5 mLs by mouth every 4 (four) hours as needed for cough.     insulin aspart (NOVOLOG) 100 UNIT/ML injection Inject 30-80 Units into the skin See admin instructions. 30 units with breakfast, 30 units with lunch, and 70 units with supper     insulin detemir (LEVEMIR) 100 UNIT/ML injection Inject 90 Units into the skin at bedtime.     JARDIANCE 25 MG TABS tablet Take 25 mg by mouth daily.     Lancets (ONETOUCH DELICA PLUS [id number]) MISC USE 1 LANCET 4 TIMES DAILY     lovastatin (MEVACOR) 40 MG tablet Take 40 mg by mouth daily with supper.     metoprolol succinate (TOPROL-XL) 25 MG 24 hr tablet Take 25 mg by mouth every morning.      ondansetron (ZOFRAN ODT) 4 MG disintegrating tablet Take 1 tablet (4 mg total) by mouth every 8 (eight) hours as needed for nausea or vomiting. 20 tablet 0  potassium chloride SA (K-DUR,KLOR-CON) 20 MEQ tablet Take 20 mEq by mouth daily.     oxyCODONE (ROXICODONE) 5 MG immediate release tablet Take 1-2 tablets (5-10 mg total) by mouth every 4 (four) hours as needed (pain). (Patient not taking: No sig reported) 30 tablet 0   traMADol (ULTRAM) 50 MG tablet Take 1-2 tablets (50-100 mg total) by mouth every 6 (six) hours as needed. (Patient not taking: No sig reported) 45 tablet 0   No current facility-administered medications on file prior to visit.    There are no  Patient Instructions on file for this visit. No follow-ups on file.   Kris Hartmann, NP

## 2021-01-30 DIAGNOSIS — M25511 Pain in right shoulder: Secondary | ICD-10-CM | POA: Diagnosis not present

## 2021-02-01 DIAGNOSIS — M25511 Pain in right shoulder: Secondary | ICD-10-CM | POA: Diagnosis not present

## 2021-02-07 DIAGNOSIS — M25511 Pain in right shoulder: Secondary | ICD-10-CM | POA: Diagnosis not present

## 2021-02-07 DIAGNOSIS — M6281 Muscle weakness (generalized): Secondary | ICD-10-CM | POA: Diagnosis not present

## 2021-02-07 DIAGNOSIS — Z9889 Other specified postprocedural states: Secondary | ICD-10-CM | POA: Diagnosis not present

## 2021-02-07 DIAGNOSIS — M25611 Stiffness of right shoulder, not elsewhere classified: Secondary | ICD-10-CM | POA: Diagnosis not present

## 2021-02-12 DIAGNOSIS — Z794 Long term (current) use of insulin: Secondary | ICD-10-CM | POA: Diagnosis not present

## 2021-02-12 DIAGNOSIS — B351 Tinea unguium: Secondary | ICD-10-CM | POA: Diagnosis not present

## 2021-02-12 DIAGNOSIS — E1140 Type 2 diabetes mellitus with diabetic neuropathy, unspecified: Secondary | ICD-10-CM | POA: Diagnosis not present

## 2021-02-16 ENCOUNTER — Encounter (INDEPENDENT_AMBULATORY_CARE_PROVIDER_SITE_OTHER): Payer: PPO | Admitting: Nurse Practitioner

## 2021-02-19 ENCOUNTER — Ambulatory Visit
Admission: RE | Admit: 2021-02-19 | Discharge: 2021-02-19 | Disposition: A | Discharge status: Home / Self Care | Payer: PPO | Source: Ambulatory Visit | Attending: Urology | Admitting: Urology

## 2021-02-19 ENCOUNTER — Other Ambulatory Visit: Payer: Self-pay | Admitting: *Deleted

## 2021-02-19 ENCOUNTER — Ambulatory Visit
Admission: RE | Admit: 2021-02-19 | Discharge: 2021-02-19 | Disposition: A | Discharge status: Home / Self Care | Payer: PPO | Attending: Urology | Admitting: Urology

## 2021-02-19 DIAGNOSIS — N200 Calculus of kidney: Secondary | ICD-10-CM

## 2021-02-19 DIAGNOSIS — Z87442 Personal history of urinary calculi: Secondary | ICD-10-CM | POA: Diagnosis not present

## 2021-02-20 ENCOUNTER — Encounter: Payer: Self-pay | Admitting: Urology

## 2021-02-20 ENCOUNTER — Ambulatory Visit: Payer: PPO | Admitting: Urology

## 2021-02-20 ENCOUNTER — Other Ambulatory Visit: Payer: Self-pay

## 2021-02-20 DIAGNOSIS — N200 Calculus of kidney: Secondary | ICD-10-CM

## 2021-02-20 DIAGNOSIS — R399 Unspecified symptoms and signs involving the genitourinary system: Secondary | ICD-10-CM

## 2021-02-20 NOTE — Progress Notes (Signed)
   02/20/2021 11:09 AM   James Clarke [birth year] [id number]  Reason for visit: Follow up nephrolithiasis, urinary symptoms  HPI: I saw James Clarke back in urology clinic for follow-up of the above issues.  He is a comorbid 80 year old male who underwent right ureteroscopy, laser lithotripsy, and stent placement for ~1.5cm of renal stone burden in January 2021.  He has not had any stone events since that time.  He also has a history of mild urinary symptoms with urgency and frequency secondary to his Lasix, but the symptoms have improved somewhat over the last few months.  He denies any gross hematuria or UTIs.  I personally viewed and interpreted his KUB today that shows no evidence of recurrent stone disease.  We discussed general stone prevention strategies including adequate hydration with goal of producing 2.5 L of urine daily, increasing citric acid intake, increasing calcium intake during high oxalate meals, minimizing animal protein, and decreasing salt intake. Information about dietary recommendations given today.   Follow-up with urology as needed   Billey Co, [place] [address] [place], [place] [address] [phone]

## 2021-02-28 DIAGNOSIS — M25611 Stiffness of right shoulder, not elsewhere classified: Secondary | ICD-10-CM | POA: Diagnosis not present

## 2021-02-28 DIAGNOSIS — M25511 Pain in right shoulder: Secondary | ICD-10-CM | POA: Diagnosis not present

## 2021-02-28 DIAGNOSIS — M6281 Muscle weakness (generalized): Secondary | ICD-10-CM | POA: Diagnosis not present

## 2021-02-28 DIAGNOSIS — Z9889 Other specified postprocedural states: Secondary | ICD-10-CM | POA: Diagnosis not present

## 2021-03-02 DIAGNOSIS — L03115 Cellulitis of right lower limb: Secondary | ICD-10-CM | POA: Diagnosis not present

## 2021-03-08 DIAGNOSIS — M25611 Stiffness of right shoulder, not elsewhere classified: Secondary | ICD-10-CM | POA: Diagnosis not present

## 2021-03-08 DIAGNOSIS — M25511 Pain in right shoulder: Secondary | ICD-10-CM | POA: Diagnosis not present

## 2021-03-08 DIAGNOSIS — Z9889 Other specified postprocedural states: Secondary | ICD-10-CM | POA: Diagnosis not present

## 2021-03-08 DIAGNOSIS — M6281 Muscle weakness (generalized): Secondary | ICD-10-CM | POA: Diagnosis not present

## 2021-03-15 DIAGNOSIS — Z9889 Other specified postprocedural states: Secondary | ICD-10-CM | POA: Diagnosis not present

## 2021-03-15 DIAGNOSIS — M6281 Muscle weakness (generalized): Secondary | ICD-10-CM | POA: Diagnosis not present

## 2021-03-15 DIAGNOSIS — M25511 Pain in right shoulder: Secondary | ICD-10-CM | POA: Diagnosis not present

## 2021-03-15 DIAGNOSIS — M25611 Stiffness of right shoulder, not elsewhere classified: Secondary | ICD-10-CM | POA: Diagnosis not present

## 2021-04-05 DIAGNOSIS — R600 Localized edema: Secondary | ICD-10-CM | POA: Diagnosis not present

## 2021-04-05 DIAGNOSIS — E1122 Type 2 diabetes mellitus with diabetic chronic kidney disease: Secondary | ICD-10-CM | POA: Diagnosis not present

## 2021-04-05 DIAGNOSIS — M25311 Other instability, right shoulder: Secondary | ICD-10-CM | POA: Diagnosis not present

## 2021-04-05 DIAGNOSIS — D631 Anemia in chronic kidney disease: Secondary | ICD-10-CM | POA: Diagnosis not present

## 2021-04-05 DIAGNOSIS — N2581 Secondary hyperparathyroidism of renal origin: Secondary | ICD-10-CM | POA: Diagnosis not present

## 2021-04-05 DIAGNOSIS — I10 Essential (primary) hypertension: Secondary | ICD-10-CM | POA: Diagnosis not present

## 2021-04-05 DIAGNOSIS — N184 Chronic kidney disease, stage 4 (severe): Secondary | ICD-10-CM | POA: Diagnosis not present

## 2021-04-13 DIAGNOSIS — I10 Essential (primary) hypertension: Secondary | ICD-10-CM | POA: Diagnosis not present

## 2021-04-13 DIAGNOSIS — N184 Chronic kidney disease, stage 4 (severe): Secondary | ICD-10-CM | POA: Diagnosis not present

## 2021-04-13 DIAGNOSIS — E1122 Type 2 diabetes mellitus with diabetic chronic kidney disease: Secondary | ICD-10-CM | POA: Diagnosis not present

## 2021-04-13 DIAGNOSIS — E1121 Type 2 diabetes mellitus with diabetic nephropathy: Secondary | ICD-10-CM | POA: Diagnosis not present

## 2021-04-13 DIAGNOSIS — E1169 Type 2 diabetes mellitus with other specified complication: Secondary | ICD-10-CM | POA: Diagnosis not present

## 2021-04-13 DIAGNOSIS — Z794 Long term (current) use of insulin: Secondary | ICD-10-CM | POA: Diagnosis not present

## 2021-04-13 DIAGNOSIS — E1142 Type 2 diabetes mellitus with diabetic polyneuropathy: Secondary | ICD-10-CM | POA: Diagnosis not present

## 2021-04-13 DIAGNOSIS — E669 Obesity, unspecified: Secondary | ICD-10-CM | POA: Diagnosis not present

## 2021-05-01 ENCOUNTER — Other Ambulatory Visit: Payer: Self-pay

## 2021-05-01 ENCOUNTER — Encounter (INDEPENDENT_AMBULATORY_CARE_PROVIDER_SITE_OTHER): Payer: Self-pay | Admitting: Nurse Practitioner

## 2021-05-01 ENCOUNTER — Ambulatory Visit (INDEPENDENT_AMBULATORY_CARE_PROVIDER_SITE_OTHER): Payer: PPO | Admitting: Nurse Practitioner

## 2021-05-01 VITALS — BP 139/66 | HR 71 | Ht 70.0 in | Wt 329.0 lb

## 2021-05-01 DIAGNOSIS — I890 Lymphedema, not elsewhere classified: Secondary | ICD-10-CM | POA: Diagnosis not present

## 2021-05-01 DIAGNOSIS — I10 Essential (primary) hypertension: Secondary | ICD-10-CM | POA: Diagnosis not present

## 2021-05-01 DIAGNOSIS — I5021 Acute systolic (congestive) heart failure: Secondary | ICD-10-CM

## 2021-05-10 ENCOUNTER — Encounter (INDEPENDENT_AMBULATORY_CARE_PROVIDER_SITE_OTHER): Payer: Self-pay | Admitting: Nurse Practitioner

## 2021-05-10 NOTE — Progress Notes (Signed)
Subjective:    Patient ID: James Clarke, male    DOB: October 23, 1940, 80 y.o.   MRN: [id number] Chief Complaint  Patient presents with   Follow-up    30mo no studies    Kassem Kibbe is an 80 year old male that returns to the office for followup evaluation regarding leg swelling.  The swelling has improved quite a bit and the pain associated with swelling has decreased substantially. There have not been any interval development of a ulcerations or wounds.  Since the previous visit the patient has been wearing graduated compression stockings and has noted improvement in the lymphedema. The patient has been using compression routinely morning until night.  The patient also states elevation during the day and exercise is being done too.        Review of Systems  Cardiovascular:  Positive for leg swelling.  All other systems reviewed and are negative.     Objective:   Physical Exam Vitals reviewed.  HENT:     Head: Normocephalic.  Cardiovascular:     Rate and Rhythm: Normal rate.     Pulses: Normal pulses.  Pulmonary:     Effort: Pulmonary effort is normal.  Musculoskeletal:     Right lower leg: 1+ Edema present.     Left lower leg: 1+ Edema present.  Skin:    General: Skin is warm and dry.  Neurological:     Mental Status: He is alert and oriented to person, place, and time.  Psychiatric:        Mood and Affect: Mood normal.        Behavior: Behavior normal.        Thought Content: Thought content normal.        Judgment: Judgment normal.    BP 139/66   Pulse 71   Ht 5\' 10"  (1.778 m)   Wt (!) 329 lb (149.2 kg)   BMI 47.21 kg/m   Past Medical History:  Diagnosis Date   Asthma    Bell's palsy    SEVERAL TIMES   Bell's palsy    Cancer (HCC)    SKIN   Diabetes mellitus without complication (HCC)    Dyspnea    DOE   History of kidney stones    Hyperlipemia    Hypertension    Kidney stone    Neuropathy    Obesity    Sleep apnea    C PAP    Social  History   Socioeconomic History   Marital status: Married    Spouse name: Not on file   Number of children: Not on file   Years of education: Not on file   Highest education level: Not on file  Occupational History   Not on file  Tobacco Use   Smoking status: Never   Smokeless tobacco: Never  Vaping Use   Vaping Use: Never used  Substance and Sexual Activity   Alcohol use: No   Drug use: Not Currently   Sexual activity: Yes    Birth control/protection: None  Other Topics Concern   Not on file  Social History Narrative   Not on file   Social Determinants of Health   Financial Resource Strain: Not on file  Food Insecurity: Not on file  Transportation Needs: Not on file  Physical Activity: Not on file  Stress: Not on file  Social Connections: Not on file  Intimate Partner Violence: Not on file    Past Surgical History:  Procedure Laterality Date  APPENDECTOMY     CATARACT EXTRACTION W/PHACO Left 05/20/2017   Procedure: CATARACT EXTRACTION PHACO AND INTRAOCULAR LENS PLACEMENT (IOC);  Surgeon: Birder Robson, MD;  Location: [facility];  Service: Ophthalmology;  Laterality: Left;  [place] 00:32.0 AP% 15.5 CDE 4.97 Fluid Pack Lot # [id number]   CATARACT EXTRACTION W/PHACO Right 06/10/2017   Procedure: CATARACT EXTRACTION PHACO AND INTRAOCULAR LENS PLACEMENT (IOC);  Surgeon: Birder Robson, MD;  Location: [facility];  Service: Ophthalmology;  Laterality: Right;  [place]  00:51 AP% 15.4 CDE 7.95 Fluid pack lot # [id number]   CHOLECYSTECTOMY     CYSTOSCOPY W/ RETROGRADES Right 08/13/2019   Procedure: CYSTOSCOPY WITH RETROGRADE PYELOGRAM;  Surgeon: Billey Co, MD;  Location: [facility];  Service: Urology;  Laterality: Right;   CYSTOSCOPY WITH STENT PLACEMENT Right 07/26/2019   Procedure: CYSTOSCOPY WITH STENT PLACEMENT;  Surgeon: Lucas Mallow, MD;  Location: [facility];  Service: Urology;  Laterality: Right;   CYSTOSCOPY/URETEROSCOPY/HOLMIUM LASER/STENT PLACEMENT Right 08/13/2019    Procedure: CYSTOSCOPY/URETEROSCOPY/HOLMIUM LASER/STENT EXCHANGE;  Surgeon: Billey Co, MD;  Location: [facility];  Service: Urology;  Laterality: Right;   FRACTURE SURGERY     WRIST   HAND SURGERY     HERNIA REPAIR     SHOULDER ARTHROSCOPY WITH SUBACROMIAL DECOMPRESSION AND OPEN ROTATOR C Right 09/11/2020   Procedure: Right shoulder arthroscopic rotator cuff repair vs Regeneten patch application, subacromial decompression, and biceps tenodesis - Reche Dixon to Assist;  Surgeon: Leim Fabry, MD;  Location: [facility];  Service: Orthopedics;  Laterality: Right;   TONSILLECTOMY      Family History  Problem Relation Age of Onset   Emphysema Mother    COPD Mother    Heart disease Mother    Brain cancer Father     Allergies  Allergen Reactions   Codeine Nausea And Vomiting   Doxycycline    Erythromycin Rash    CBC Latest Ref Rng & Units 09/11/2020 07/26/2019 08/12/2015  WBC 4.0 - 10.5 K/uL - 13.8(H) 12.3(H)  Hemoglobin 13.0 - 17.0 g/dL 11.6(L) 12.4(L) 13.8  Hematocrit 39.0 - 52.0 % 34.0(L) 38.7(L) 43.1  Platelets 150 - 400 K/uL - 326 283      CMP     Component Value Date/Time   NA 139 10/17/2020 1306   NA 137 11/27/2013 0412   K 3.6 10/17/2020 1306   K 3.5 11/27/2013 0412   CL 100 10/17/2020 1306   CL 105 11/27/2013 0412   CO2 27 10/17/2020 1306   CO2 25 11/27/2013 0412   GLUCOSE 119 (H) 10/17/2020 1306   GLUCOSE 98 11/27/2013 0412   BUN 79 (H) 10/17/2020 1306   BUN 42 (H) 11/27/2013 0412   CREATININE 2.35 (H) 10/17/2020 1306   CREATININE 2.14 (H) 11/27/2013 0412   CALCIUM 9.6 10/17/2020 1306   CALCIUM 7.6 (L) 11/27/2013 0412   PROT 7.2 03/13/2020 1531   PROT 7.4 11/27/2013 0412   ALBUMIN 3.4 (L) 10/17/2020 1306   ALBUMIN 3.1 (L) 11/27/2013 0412   AST 18 03/13/2020 1531   AST 47 (H) 11/27/2013 0412   ALT 14 03/13/2020 1531   ALT 41 11/27/2013 0412   ALKPHOS 65 03/13/2020 1531   ALKPHOS 82 11/27/2013 0412   BILITOT 0.5 03/13/2020 1531   BILITOT 0.4 11/27/2013  0412   GFRNONAA 27 (L) 10/17/2020 1306   GFRNONAA 30 (L) 11/27/2013 0412   GFRAA 30 (L) 03/13/2020 1531   GFRAA 34 (L) 11/27/2013 0412     No results found.  Assessment & Plan:   1. Lymphedema  No surgery or intervention at this point in time.    I have reviewed my previous discussion with the patient regarding swelling and why it  causes symptoms.  The patient is doing well with compression and will continue wearing graduated compression stockings class 1 (20-30 mmHg) on a daily basis a prescription was given. The patient will  continue wearing the stockings first thing in the morning and removing them in the evening. The patient is instructed specifically not to sleep in the stockings.    In addition, behavioral modification including elevation during the day and exercise will be continued.    Patient should follow-up on an annual basis    2. Acute systolic CHF (congestive heart failure) (HCC) Distal swelling contribute to worsening lower extremity edema however this has been under fairly good control of the patient lately.  We will continue to follow-up with cardiologist and primary care for fluid status.  3. Hypertension, unspecified type Continue antihypertensive medications as already ordered, these medications have been reviewed and there are no changes at this time.    Current Outpatient Medications on File Prior to Visit  Medication Sig Dispense Refill   acetaminophen (TYLENOL) 500 MG tablet Take 2 tablets (1,000 mg total) by mouth every 8 (eight) hours. 90 tablet 2   albuterol (PROVENTIL HFA;VENTOLIN HFA) 108 (90 Base) MCG/ACT inhaler Inhale 2 puffs into the lungs every 6 (six) hours as needed for wheezing or shortness of breath.     amLODipine-olmesartan (AZOR) 5-40 MG tablet Take 1 tablet by mouth every morning.      calcitRIOL (ROCALTROL) 0.25 MCG capsule Take 0.25 mcg by mouth daily.     furosemide (LASIX) 40 MG tablet Take 40 mg by mouth 2 (two) times a week.      GLOBAL INJECT EASE INSULIN SYR 31G X 5/16" 1 ML MISC 615 mg.      glucose blood (ONETOUCH ULTRA) test strip TEST BLOOD SUGAR 4 TIMES DAILY     insulin aspart (NOVOLOG) 100 UNIT/ML injection Inject 30-80 Units into the skin See admin instructions. 30 units with breakfast, 30 units with lunch, and 70 units with supper     insulin detemir (LEVEMIR) 100 UNIT/ML injection Inject 90 Units into the skin at bedtime.     JARDIANCE 25 MG TABS tablet Take 25 mg by mouth daily.     Lancets (ONETOUCH DELICA PLUS BULAGT36I) MISC USE 1 LANCET 4 TIMES DAILY     lovastatin (MEVACOR) 40 MG tablet Take 40 mg by mouth daily with supper.     metoprolol succinate (TOPROL-XL) 25 MG 24 hr tablet Take 25 mg by mouth every morning.      potassium chloride SA (K-DUR,KLOR-CON) 20 MEQ tablet Take 20 mEq by mouth daily.     traMADol (ULTRAM) 50 MG tablet Take by mouth.     No current facility-administered medications on file prior to visit.    There are no Patient Instructions on file for this visit. No follow-ups on file.   Kris Hartmann, NP

## 2021-05-16 DIAGNOSIS — Z794 Long term (current) use of insulin: Secondary | ICD-10-CM | POA: Diagnosis not present

## 2021-05-16 DIAGNOSIS — B351 Tinea unguium: Secondary | ICD-10-CM | POA: Diagnosis not present

## 2021-05-16 DIAGNOSIS — E1140 Type 2 diabetes mellitus with diabetic neuropathy, unspecified: Secondary | ICD-10-CM | POA: Diagnosis not present

## 2021-05-29 ENCOUNTER — Other Ambulatory Visit: Payer: Self-pay | Admitting: Orthopedic Surgery

## 2021-05-29 DIAGNOSIS — M25311 Other instability, right shoulder: Secondary | ICD-10-CM | POA: Diagnosis not present

## 2021-05-29 DIAGNOSIS — M25511 Pain in right shoulder: Secondary | ICD-10-CM

## 2021-05-30 DIAGNOSIS — G4733 Obstructive sleep apnea (adult) (pediatric): Secondary | ICD-10-CM | POA: Diagnosis not present

## 2021-06-05 DIAGNOSIS — I129 Hypertensive chronic kidney disease with stage 1 through stage 4 chronic kidney disease, or unspecified chronic kidney disease: Secondary | ICD-10-CM | POA: Diagnosis not present

## 2021-06-05 DIAGNOSIS — E1122 Type 2 diabetes mellitus with diabetic chronic kidney disease: Secondary | ICD-10-CM | POA: Diagnosis not present

## 2021-06-05 DIAGNOSIS — N2581 Secondary hyperparathyroidism of renal origin: Secondary | ICD-10-CM | POA: Diagnosis not present

## 2021-06-05 DIAGNOSIS — N184 Chronic kidney disease, stage 4 (severe): Secondary | ICD-10-CM | POA: Diagnosis not present

## 2021-06-05 DIAGNOSIS — Z6841 Body Mass Index (BMI) 40.0 and over, adult: Secondary | ICD-10-CM | POA: Diagnosis not present

## 2021-06-05 DIAGNOSIS — E1151 Type 2 diabetes mellitus with diabetic peripheral angiopathy without gangrene: Secondary | ICD-10-CM | POA: Diagnosis not present

## 2021-06-05 DIAGNOSIS — E1142 Type 2 diabetes mellitus with diabetic polyneuropathy: Secondary | ICD-10-CM | POA: Diagnosis not present

## 2021-06-06 DIAGNOSIS — H43813 Vitreous degeneration, bilateral: Secondary | ICD-10-CM | POA: Diagnosis not present

## 2021-06-12 DIAGNOSIS — I10 Essential (primary) hypertension: Secondary | ICD-10-CM | POA: Diagnosis not present

## 2021-06-12 DIAGNOSIS — G4733 Obstructive sleep apnea (adult) (pediatric): Secondary | ICD-10-CM | POA: Diagnosis not present

## 2021-06-12 DIAGNOSIS — E7800 Pure hypercholesterolemia, unspecified: Secondary | ICD-10-CM | POA: Diagnosis not present

## 2021-06-12 DIAGNOSIS — I700 Atherosclerosis of aorta: Secondary | ICD-10-CM | POA: Diagnosis not present

## 2021-06-12 DIAGNOSIS — E1142 Type 2 diabetes mellitus with diabetic polyneuropathy: Secondary | ICD-10-CM | POA: Diagnosis not present

## 2021-06-12 DIAGNOSIS — R600 Localized edema: Secondary | ICD-10-CM | POA: Diagnosis not present

## 2021-06-12 DIAGNOSIS — N184 Chronic kidney disease, stage 4 (severe): Secondary | ICD-10-CM | POA: Diagnosis not present

## 2021-06-25 ENCOUNTER — Other Ambulatory Visit: Payer: Self-pay

## 2021-06-25 ENCOUNTER — Ambulatory Visit
Payer: PPO | Attending: Student in an Organized Health Care Education/Training Program | Admitting: Student in an Organized Health Care Education/Training Program

## 2021-06-25 ENCOUNTER — Encounter: Payer: Self-pay | Admitting: Student in an Organized Health Care Education/Training Program

## 2021-06-25 VITALS — BP 165/79 | HR 84 | Temp 97.0°F | Resp 16 | Ht 70.0 in | Wt 327.9 lb

## 2021-06-25 DIAGNOSIS — M25511 Pain in right shoulder: Secondary | ICD-10-CM | POA: Diagnosis not present

## 2021-06-25 DIAGNOSIS — M19011 Primary osteoarthritis, right shoulder: Secondary | ICD-10-CM | POA: Diagnosis not present

## 2021-06-25 DIAGNOSIS — G894 Chronic pain syndrome: Secondary | ICD-10-CM | POA: Diagnosis not present

## 2021-06-25 DIAGNOSIS — M75101 Unspecified rotator cuff tear or rupture of right shoulder, not specified as traumatic: Secondary | ICD-10-CM | POA: Insufficient documentation

## 2021-06-25 DIAGNOSIS — G8929 Other chronic pain: Secondary | ICD-10-CM | POA: Insufficient documentation

## 2021-06-25 DIAGNOSIS — M12811 Other specific arthropathies, not elsewhere classified, right shoulder: Secondary | ICD-10-CM | POA: Diagnosis not present

## 2021-06-25 DIAGNOSIS — N183 Chronic kidney disease, stage 3 unspecified: Secondary | ICD-10-CM | POA: Diagnosis not present

## 2021-06-25 NOTE — Progress Notes (Signed)
Safety precautions to be maintained throughout the outpatient stay will include: orient to surroundings, keep bed in low position, maintain call bell within reach at all times, provide assistance with transfer out of bed and ambulation.  

## 2021-06-25 NOTE — Patient Instructions (Signed)
GENERAL RISKS AND COMPLICATIONS ° °What are the risk, side effects and possible complications? °Generally speaking, most procedures are safe.  However, with any procedure there are risks, side effects, and the possibility of complications.  The risks and complications are dependent upon the sites that are lesioned, or the type of nerve block to be performed.  The closer the procedure is to the spine, the more serious the risks are.  Great care is taken when placing the radio frequency needles, block needles or lesioning probes, but sometimes complications can occur. °Infection: Any time there is an injection through the skin, there is a risk of infection.  This is why sterile conditions are used for these blocks.  There are four possible types of infection. °Localized skin infection. °Central Nervous System Infection-This can be in the form of Meningitis, which can be deadly. °Epidural Infections-This can be in the form of an epidural abscess, which can cause pressure inside of the spine, causing compression of the spinal cord with subsequent paralysis. This would require an emergency surgery to decompress, and there are no guarantees that the patient would recover from the paralysis. °Discitis-This is an infection of the intervertebral discs.  It occurs in about 1% of discography procedures.  It is difficult to treat and it may lead to surgery. ° °      2. Pain: the needles have to go through skin and soft tissues, will cause soreness. °      3. Damage to internal structures:  The nerves to be lesioned may be near blood vessels or   ° other nerves which can be potentially damaged. °      4. Bleeding: Bleeding is more common if the patient is taking blood thinners such as  aspirin, Coumadin, Ticiid, Plavix, etc., or if he/she have some genetic predisposition  such as hemophilia. Bleeding into the spinal canal can cause compression of the spinal  cord with subsequent paralysis.  This would require an emergency  surgery to  decompress and there are no guarantees that the patient would recover from the  paralysis. °      5. Pneumothorax:  Puncturing of a lung is a possibility, every time a needle is introduced in  the area of the chest or upper back.  Pneumothorax refers to free air around the  collapsed lung(s), inside of the thoracic cavity (chest cavity).  Another two possible  complications related to a similar event would include: Hemothorax and Chylothorax.   These are variations of the Pneumothorax, where instead of air around the collapsed  lung(s), you may have blood or chyle, respectively. °      6. Spinal headaches: They may occur with any procedures in the area of the spine. °      7. Persistent CSF (Cerebro-Spinal Fluid) leakage: This is a rare problem, but may occur  with prolonged intrathecal or epidural catheters either due to the formation of a fistulous  track or a dural tear. °      8. Nerve damage: By working so close to the spinal cord, there is always a possibility of  nerve damage, which could be as serious as a permanent spinal cord injury with  paralysis. °      9. Death:  Although rare, severe deadly allergic reactions known as "Anaphylactic  reaction" can occur to any of the medications used. °     10. Worsening of the symptoms:  We can always make thing worse. ° °What are the chances   of something like this happening? °Chances of any of this occuring are extremely low.  By statistics, you have more of a chance of getting killed in a motor vehicle accident: while driving to the hospital than any of the above occurring .  Nevertheless, you should be aware that they are possibilities.  In general, it is similar to taking a shower.  Everybody knows that you can slip, hit your head and get killed.  Does that mean that you should not shower again?  Nevertheless always keep in mind that statistics do not mean anything if you happen to be on the wrong side of them.  Even if a procedure has a 1 (one) in a  1,000,000 (million) chance of going wrong, it you happen to be that one..Also, keep in mind that by statistics, you have more of a chance of having something go wrong when taking medications. ° °Who should not have this procedure? °If you are on a blood thinning medication (e.g. Coumadin, Plavix, see list of "Blood Thinners"), or if you have an active infection going on, you should not have the procedure.  If you are taking any blood thinners, please inform your physician. ° °How should I prepare for this procedure? °Do not eat or drink anything at least six hours prior to the procedure. °Bring a driver with you .  It cannot be a taxi. °Come accompanied by an adult that can drive you back, and that is strong enough to help you if your legs get weak or numb from the local anesthetic. °Take all of your medicines the morning of the procedure with just enough water to swallow them. °If you have diabetes, make sure that you are scheduled to have your procedure done first thing in the morning, whenever possible. °If you have diabetes, take only half of your insulin dose and notify our nurse that you have done so as soon as you arrive at the clinic. °If you are diabetic, but only take blood sugar pills (oral hypoglycemic), then do not take them on the morning of your procedure.  You may take them after you have had the procedure. °Do not take aspirin or any aspirin-containing medications, at least eleven (11) days prior to the procedure.  They may prolong bleeding. °Wear loose fitting clothing that may be easy to take off and that you would not mind if it got stained with Betadine or blood. °Do not wear any jewelry or perfume °Remove any nail coloring.  It will interfere with some of our monitoring equipment. ° °NOTE: Remember that this is not meant to be interpreted as a complete list of all possible complications.  Unforeseen problems may occur. ° °BLOOD THINNERS °The following drugs contain aspirin or other products,  which can cause increased bleeding during surgery and should not be taken for 2 weeks prior to and 1 week after surgery.  If you should need take something for relief of minor pain, you may take acetaminophen which is found in Tylenol,m Datril, Anacin-3 and Panadol. It is not blood thinner. The products listed below are.  Do not take any of the products listed below in addition to any listed on your instruction sheet. ° °A.P.C or A.P.C with Codeine Codeine Phosphate Capsules #3 Ibuprofen Ridaura  °ABC compound Congesprin Imuran rimadil  °Advil Cope Indocin Robaxisal  °Alka-Seltzer Effervescent Pain Reliever and Antacid Coricidin or Coricidin-D ° Indomethacin Rufen  °Alka-Seltzer plus Cold Medicine Cosprin Ketoprofen S-A-C Tablets  °Anacin Analgesic Tablets or Capsules Coumadin   Korlgesic Salflex  Anacin Extra Strength Analgesic tablets or capsules CP-2 Tablets Lanoril Salicylate  Anaprox Cuprimine Capsules Levenox Salocol  Anexsia-D Dalteparin Magan Salsalate  Anodynos Darvon compound Magnesium Salicylate Sine-off  Ansaid Dasin Capsules Magsal Sodium Salicylate  Anturane Depen Capsules Marnal Soma  APF Arthritis pain formula Dewitt's Pills Measurin Stanback  Argesic Dia-Gesic Meclofenamic Sulfinpyrazone  Arthritis Bayer Timed Release Aspirin Diclofenac Meclomen Sulindac  Arthritis pain formula Anacin Dicumarol Medipren Supac  Analgesic (Safety coated) Arthralgen Diffunasal Mefanamic Suprofen  Arthritis Strength Bufferin Dihydrocodeine Mepro Compound Suprol  Arthropan liquid Dopirydamole Methcarbomol with Aspirin Synalgos  ASA tablets/Enseals Disalcid Micrainin Tagament  Ascriptin Doan's Midol Talwin  Ascriptin A/D Dolene Mobidin Tanderil  Ascriptin Extra Strength Dolobid Moblgesic Ticlid  Ascriptin with Codeine Doloprin or Doloprin with Codeine Momentum Tolectin  Asperbuf Duoprin Mono-gesic Trendar  Aspergum Duradyne Motrin or Motrin IB Triminicin  Aspirin plain, buffered or enteric coated  Durasal Myochrisine Trigesic  Aspirin Suppositories Easprin Nalfon Trillsate  Aspirin with Codeine Ecotrin Regular or Extra Strength Naprosyn Uracel  Atromid-S Efficin Naproxen Ursinus  Auranofin Capsules Elmiron Neocylate Vanquish  Axotal Emagrin Norgesic Verin  Azathioprine Empirin or Empirin with Codeine Normiflo Vitamin E  Azolid Emprazil Nuprin Voltaren  Bayer Aspirin plain, buffered or children's or timed BC Tablets or powders Encaprin Orgaran Warfarin Sodium  Buff-a-Comp Enoxaparin Orudis Zorpin  Buff-a-Comp with Codeine Equegesic Os-Cal-Gesic   Buffaprin Excedrin plain, buffered or Extra Strength Oxalid   Bufferin Arthritis Strength Feldene Oxphenbutazone   Bufferin plain or Extra Strength Feldene Capsules Oxycodone with Aspirin   Bufferin with Codeine Fenoprofen Fenoprofen Pabalate or Pabalate-SF   Buffets II Flogesic Panagesic   Buffinol plain or Extra Strength Florinal or Florinal with Codeine Panwarfarin   Buf-Tabs Flurbiprofen Penicillamine   Butalbital Compound Four-way cold tablets Penicillin   Butazolidin Fragmin Pepto-Bismol   Carbenicillin Geminisyn Percodan   Carna Arthritis Reliever Geopen Persantine   Carprofen Gold's salt Persistin   Chloramphenicol Goody's Phenylbutazone   Chloromycetin Haltrain Piroxlcam   Clmetidine heparin Plaquenil   Cllnoril Hyco-pap Ponstel   Clofibrate Hydroxy chloroquine Propoxyphen         Before stopping any of these medications, be sure to consult the physician who ordered them.  Some, such as Coumadin (Warfarin) are ordered to prevent or treat serious conditions such as "deep thrombosis", "pumonary embolisms", and other heart problems.  The amount of time that you may need off of the medication may also vary with the medication and the reason for which you were taking it.  If you are taking any of these medications, please make sure you notify your pain physician before you undergo any procedures.         Selective Nerve Root  Block Patient Information  Description: Specific nerve roots exit the spinal canal and these nerves can be compressed and inflamed by a bulging disc and bone spurs.  By injecting steroids on the nerve root, we can potentially decrease the inflammation surrounding these nerves, which often leads to decreased pain.  Also, by injecting local anesthesia on the nerve root, this can provide [place] helpful information to give to your referring doctor if it decreases your pain.  Selective nerve root blocks can be done along the spine from the neck to the low back depending on the location of your pain.   After numbing the skin with local anesthesia, a small needle is passed to the nerve root and the position of the needle is verified using x-ray pictures.  After the needle is  in correct position, we then deposit the medication.  You may experience a pressure sensation while this is being done.  The entire block usually lasts less than 15 minutes.  Conditions that may be treated with selective nerve root blocks: Low back and leg pain Spinal stenosis Diagnostic block prior to potential surgery Neck and arm pain Post laminectomy syndrome  Preparation for the injection:  Do not eat any solid food or dairy products within 8 hours of your appointment. You may drink clear liquids up to 3 hours before an appointment.  Clear liquids include water, black coffee, juice or soda.  No milk or cream please. You may take your regular medications, including pain medications, with a sip of water before your appointment.  Diabetics should hold regular insulin (if taken separately) and take 1/2 normal NPH dose the morning of the procedure.  Carry some sugar containing items with you to your appointment. A driver must accompany you and be prepared to drive you home after your procedure. Bring all your current medications with you. An IV may be inserted and sedation may be given at the discretion of the physician. A blood  pressure cuff, EKG, and other monitors will often be applied during the procedure.  Some patients may need to have extra oxygen administered for a short period. You will be asked to provide medical information, including allergies, prior to the procedure.  We must know immediately if you are taking blood  Thinners (like Coumadin) or if you are allergic to IV iodine contrast (dye).  Possible side-effects: All are usually temporary Bleeding from needle site Light headedness Numbness and tingling Decreased blood pressure Weakness in arms/legs Pressure sensation in back/neck Pain at injection site (several days)  Possible complications: All are extremely rare Infection Nerve injury Spinal headache (a headache wore with upright position)  Call if you experience: Fever/chills associated with headache or increased back/neck pain Headache worsened by an upright position New onset weakness or numbness of an extremity below the injection site Hives or difficulty breathing (go to the emergency room) Inflammation or drainage at the injection site(s) Severe back/neck pain greater than usual New symptoms which are concerning to you  Please note:  Although the local anesthetic injected can often make your back or neck feel good for several hours after the injection the pain will likely return.  It takes 3-5 days for steroids to work on the nerve root. You may not notice any pain relief for at least one week.  If effective, we will often do a series of 3 injections spaced 3-6 weeks apart to maximally decrease your pain.    If you have any questions, please call [phone] [facility]

## 2021-06-25 NOTE — Progress Notes (Signed)
Patient: James Clarke  Service Category: E/M  Provider: Gillis Santa, MD  DOB: 17-Jun-1941  DOS: 06/25/2021  Referring Provider: Leim Fabry, MD  MRN: [id number]  Setting: Ambulatory outpatient  PCP: Sofie Hartigan, MD  Type: New Patient  Specialty: Interventional Pain Management    Location: Office  Delivery: Face-to-face     Primary Reason(s) for Visit: Encounter for initial evaluation of one or more chronic problems (new to examiner) potentially causing chronic pain, and posing a threat to normal musculoskeletal function. (Level of risk: High) CC: Arm Pain (right)  HPI  Mr. Baggett is a 80 y.o. year old, male patient, who comes for the first time to our practice referred by Leim Fabry, MD for our initial evaluation of his chronic pain. He has Type 2 diabetes mellitus with renal manifestations ([place]); Sleep apnea; Perennial allergic rhinitis; Morbid obesity due to excess calories ([place]); Microalbuminuria; Long-term insulin use ([place]); Hypertension; Hyperlipemia; Fatty liver; Bilateral leg edema; Aortic atherosclerosis ([place]); Anemia in chronic kidney disease; Chronic kidney disease, stage III (moderate) ([place]); Edema of lower extremity; Malignant essential hypertension; Old complex tear of medial meniscus of right knee; Primary osteoarthritis of right knee; Secondary hyperparathyroidism of renal origin ([place]); Ureteral calculus; Kidney stones; Acute systolic CHF (congestive heart failure) ([place]); Right rotator cuff tear arthropathy; Chronic right shoulder pain; Localized osteoarthritis of right shoulder; and Chronic pain syndrome on their problem list. Today he comes in for evaluation of his Arm Pain (right)  Pain Assessment: Location: Right Arm Radiating: right upper arm to right elbow Onset: More than a month ago Duration: Chronic pain Quality: Aching, Burning, Constant Severity: 8 /10 (subjective, self-reported pain score)  Effect on ADL: wakes me up at night Timing: Constant Modifying  factors: extra strength tylenol BP: (!) 165/79  HR: 84  Onset and Duration: Date of onset: October 2021 and Present longer than 3 months Cause of pain: Trauma Severity: No change since onset, NAS-11 at its worse: 10/10, NAS-11 at its best: 6/10, NAS-11 now: 8/10, and NAS-11 on the average: 8/10 Timing: Morning Aggravating Factors: Motion, Surgery made it worse, and Twisting Alleviating Factors:  none Associated Problems: Spasms, Swelling, Tingling, and Pain that wakes patient up Quality of Pain: Throbbing and Tingling Previous Examinations or Tests: MRI scan and X-rays Previous Treatments: Physical Therapy  Savas is a pleasant 80 year old male who presents with a chief complaint of right shoulder pain.  He is status post shoulder surgery on 09/11/20: PROCEDURES: ,1. Right arthroscopic rotator cuff repair,2. Right arthroscopic biceps tenodesis,3. Right arthroscopic subacromial decompression, 4. Right arthroscopic extensive debridement of shoulder (glenohumeral and subacromial spaces) He states that he did well for the first 3 months after surgery but now is dealing with right shoulder pain and limited range of motion.  He has done physical therapy.  He has had a right glenohumeral joint injection since surgery which provided approximately 3 weeks of pain relief.  He has difficulty raising his right hand overhead secondary to pain.  He is being referred here by Dr. Posey Pronto for consideration of a right suprascapular nerve block.  Patient is also a type II diabetic on insulin.  He has numbness and pain in both of his feet related to painful diabetic neuropathy.  Patient also has chronic kidney disease and sees Dr. Anthonette Legato for that.  Historic Controlled Substance Pharmacotherapy Review  PMP and historical list of controlled substances:  Tramadol 50 mg twice daily as needed  Risk Assessment Profile: Aberrant behavior: None observed or detected today Risk  factors for fatal opioid overdose:  None identified today Fatal overdose hazard ratio (HR): Calculation deferred Non-fatal overdose hazard ratio (HR): Calculation deferred Risk of opioid abuse or dependence: 0.7-3.0% with doses ? 36 MME/day and 6.1-26% with doses ? 120 MME/day. Substance use disorder (SUD) risk level: See below Personal History of Substance Abuse (SUD-Substance use disorder):  Alcohol: Negative  Illegal Drugs: Negative  Rx Drugs: Negative  ORT Risk Level calculation: Low Risk  Opioid Risk Tool - 06/25/21 1256       Family History of Substance Abuse   Alcohol Positive Male    Illegal Drugs Negative    Rx Drugs Negative      Personal History of Substance Abuse   Alcohol Negative    Illegal Drugs Negative    Rx Drugs Negative      Age   Age between 57-45 years  No      History of Preadolescent Sexual Abuse   History of Preadolescent Sexual Abuse Negative or Male      Psychological Disease   Psychological Disease Negative    Depression Negative      Total Score   Opioid Risk Tool Scoring 3    Opioid Risk Interpretation Low Risk            ORT Scoring interpretation table:  Score <3 = Low Risk for SUD  Score between 4-7 = Moderate Risk for SUD  Score >8 = High Risk for Opioid Abuse   PHQ-2 Depression Scale:  Total score:    PHQ-2 Scoring interpretation table: (Score and probability of major depressive disorder)  Score 0 = No depression  Score 1 = 15.4% Probability  Score 2 = 21.1% Probability  Score 3 = 38.4% Probability  Score 4 = 45.5% Probability  Score 5 = 56.4% Probability  Score 6 = 78.6% Probability   PHQ-9 Depression Scale:  Total score:    PHQ-9 Scoring interpretation table:  Score 0-4 = No depression  Score 5-9 = Mild depression  Score 10-14 = Moderate depression  Score 15-19 = Moderately severe depression  Score 20-27 = Severe depression (2.4 times higher risk of SUD and 2.89 times higher risk of overuse)   Pharmacologic Plan: As per protocol, I have not taken  over any controlled substance management, pending the results of ordered tests and/or consults.            Initial impression: Pending review of available data and ordered tests.  Meds   Current Outpatient Medications:    acetaminophen (TYLENOL) 500 MG tablet, Take 2 tablets (1,000 mg total) by mouth every 8 (eight) hours., Disp: 90 tablet, Rfl: 2   albuterol (PROVENTIL HFA;VENTOLIN HFA) 108 (90 Base) MCG/ACT inhaler, Inhale 2 puffs into the lungs every 6 (six) hours as needed for wheezing or shortness of breath., Disp: , Rfl:    AMLODIPINE BESYLATE PO, Take 5 mg by mouth daily., Disp: , Rfl:    calcitRIOL (ROCALTROL) 0.25 MCG capsule, Take 0.25 mcg by mouth daily., Disp: , Rfl:    furosemide (LASIX) 40 MG tablet, Take 40 mg by mouth 3 (three) times a week., Disp: , Rfl:    GLOBAL INJECT EASE INSULIN SYR 31G X 5/16" 1 ML MISC, 615 mg. , Disp: , Rfl:    glucose blood (ONETOUCH ULTRA) test strip, TEST BLOOD SUGAR 4 TIMES DAILY, Disp: , Rfl:    insulin aspart (NOVOLOG) 100 UNIT/ML injection, Inject 30-80 Units into the skin See admin instructions. 30 units with breakfast, 30 units with  lunch, and 70 units with supper, Disp: , Rfl:    insulin detemir (LEVEMIR) 100 UNIT/ML injection, Inject 80 Units into the skin at bedtime., Disp: , Rfl:    JARDIANCE 25 MG TABS tablet, Take 25 mg by mouth daily., Disp: , Rfl:    Lancets (ONETOUCH DELICA PLUS [id number]) MISC, USE 1 LANCET 4 TIMES DAILY, Disp: , Rfl:    lovastatin (MEVACOR) 40 MG tablet, Take 40 mg by mouth daily with supper., Disp: , Rfl:    metoprolol succinate (TOPROL-XL) 25 MG 24 hr tablet, Take 50 mg by mouth every morning., Disp: , Rfl:    potassium chloride SA (K-DUR,KLOR-CON) 20 MEQ tablet, Take 20 mEq by mouth daily., Disp: , Rfl:    traMADol (ULTRAM) 50 MG tablet, Take by mouth 2 (two) times daily as needed., Disp: , Rfl:    valsartan (DIOVAN) 160 MG tablet, Take 160 mg by mouth daily., Disp: , Rfl:    amLODipine-olmesartan (AZOR) 5-40 MG  tablet, Take 1 tablet by mouth every morning.  (Patient not taking: Reported on 06/25/2021), Disp: , Rfl:   Imaging Review    MR SHOULDER RIGHT WO CONTRAST  Narrative CLINICAL DATA:  Injured shoulder 1 month ago. Persistent pain and limited range of motion.  EXAM: MRI OF THE RIGHT SHOULDER WITHOUT CONTRAST  TECHNIQUE: Multiplanar, multisequence MR imaging of the shoulder was performed. No intravenous contrast was administered.  COMPARISON:  None.  FINDINGS: Rotator cuff: Significant rotator cuff tendinopathy/tendinosis with interstitial tears and suspected shallow bursal surface tear involving the attachment fibers at the infraspinatus supraspinatus junction region. There is also a small/shallow articular surface tear involving the supraspinatus tendon anteriorly. No discrete full-thickness retracted rotator cuff tear is identified. The subscapularis tendon is intact.  Muscles:  Unremarkable.  Biceps long head: Intact. Moderate tendinopathy involving the intra-articular portion.  Acromioclavicular Joint: Moderate degenerative changes. Type 1 acromion. Mild lateral downsloping without definite subacromial spurring.  Glenohumeral Joint: Mild degenerative changes with degenerative chondrosis and mild joint space narrowing. No joint effusion or synovitis.  Labrum:  Labral degenerative changes without definite tear.  Bones:  No acute bony findings.  Other: Moderate subacromial/subdeltoid bursitis.  IMPRESSION: 1. Significant rotator cuff tendinopathy/tendinosis with interstitial tears and suspected shallow bursal and articular surface tears. No discrete full-thickness retracted rotator cuff tear. 2. Moderate tendinopathy involving the intra-articular portion of the long head biceps tendon. 3. Moderate AC joint degenerative changes and mild lateral downsloping of the acromion. 4. Mild glenohumeral joint degenerative changes. 5. Moderate subacromial/subdeltoid  bursitis.   Electronically Signed By: Marijo Sanes M.D. On: 06/21/2020 09:40 MR KNEE RIGHT WO CONTRAST  Narrative CLINICAL DATA:  Right knee pain for 3 weeks.  No specific injury.  EXAM: MRI OF THE RIGHT KNEE WITHOUT CONTRAST  TECHNIQUE: Multiplanar, multisequence MR imaging of the knee was performed. No intravenous contrast was administered.  COMPARISON:  None.  FINDINGS: MENISCI  Medial meniscus: Radial tear involving the posterior horn back near the meniscal root with detachment and associated medial protrusion of the meniscus of approximately 5.5 mm. The meniscus demonstrates typical intrasubstance mucinous degenerative changes.  Lateral meniscus:  Free edge tear involving the posterior horn.  LIGAMENTS  Cruciates:  Intact  Collaterals:  Intact.  Mild MCL and pes anserine bursitis.  CARTILAGE  Patellofemoral:  Moderate degenerative chondrosis  Medial: Moderate to advanced degenerative chondrosis with moderate cartilage thinning, early joint space narrowing and early spurring.  Lateral: Moderate degenerative chondrosis with early spurring changes.  Joint:  Small joint effusion.  Popliteal Fossa:  No popliteal mass or Baker's cyst.  Extensor Mechanism: The patella retinacular structures are intact and the quadriceps and patellar tendons are intact.  Bones:  No acute bony findings.  Other: Normal knee musculature.  IMPRESSION: 1. Radial tear involving the posterior horn of the medial meniscus with associated medial protrusion and intrasubstance mucinous degenerative changes. 2. Free edge tear involving the posterior horn of the lateral meniscus. 3. Intact ligamentous structures and no acute bony findings. 4. Mild MCL and pes anserine bursitis. 5. Tricompartmental degenerative chondrosis. 6. Small joint effusion.   Electronically Signed By: Marijo Sanes M.D. On: 09/24/2018 13:27  DG Foot Complete Left  Narrative CLINICAL DATA:  Medial side  heel pain after wearing different pair of shoes with less cushion. No other known issues  EXAM: LEFT FOOT - COMPLETE 3+ VIEW  COMPARISON:  None.  FINDINGS: There is no acute fracture or subluxation. Moderate plantar calcaneal spur is present. There is calcification associated with the Achilles tendon. No radiopaque foreign body or soft tissue gas.  IMPRESSION: No evidence for acute  abnormality.  Plantar and Achilles calcaneal spurs.   Electronically Signed By: Nolon Nations M.D. On: 05/15/2018 13:42    Complexity Note: Imaging results reviewed. Results shared with Mr. Goethe, using Layman's terms.                         ROS  Cardiovascular: High blood pressure Pulmonary or Respiratory: Shortness of breath Neurological: No reported neurological signs or symptoms such as seizures, abnormal skin sensations, urinary and/or fecal incontinence, being born with an abnormal open spine and/or a tethered spinal cord Psychological-Psychiatric: No reported psychological or psychiatric signs or symptoms such as difficulty sleeping, anxiety, depression, delusions or hallucinations (schizophrenial), mood swings (bipolar disorders) or suicidal ideations or attempts Gastrointestinal: No reported gastrointestinal signs or symptoms such as vomiting or evacuating blood, reflux, heartburn, alternating episodes of diarrhea and constipation, inflamed or scarred liver, or pancreas or irrregular and/or infrequent bowel movements Genitourinary: Kidney disease and Passing kidney stones Hematological: No reported hematological signs or symptoms such as prolonged bleeding, low or poor functioning platelets, bruising or bleeding easily, hereditary bleeding problems, low energy levels due to low hemoglobin or being anemic Endocrine: High blood sugar requiring insulin (IDDM) and High thyroid Rheumatologic: Joint aches and or swelling due to excess weight (Osteoarthritis) Musculoskeletal: Negative for  myasthenia gravis, muscular dystrophy, multiple sclerosis or malignant hyperthermia Work History: Retired  Allergies  Mr. Dougher is allergic to codeine, doxycycline, and erythromycin.  Laboratory Chemistry Profile   Renal Lab Results  Component Value Date   BUN 79 (H) 10/17/2020   CREATININE 2.35 (H) 10/17/2020   GFRAA 30 (L) 03/13/2020   GFRNONAA 27 (L) 10/17/2020   SPECGRAV 1.020 08/19/2019   PHUR 5.0 08/19/2019   PROTEINUR 2+ (A) 08/19/2019     Electrolytes Lab Results  Component Value Date   NA 139 10/17/2020   K 3.6 10/17/2020   CL 100 10/17/2020   CALCIUM 9.6 10/17/2020   PHOS 3.5 10/17/2020     Hepatic Lab Results  Component Value Date   AST 18 03/13/2020   ALT 14 03/13/2020   ALBUMIN 3.4 (L) 10/17/2020   ALKPHOS 65 03/13/2020   LIPASE 28 08/12/2015     ID Lab Results  Component Value Date   [place] NEGATIVE 09/08/2020     Bone No results found for: [place], [id number], [id number], [id number], [place], 25OHVITD2, 25OHVITD3, TESTOFREE, TESTOSTERONE   Endocrine Lab  Results  Component Value Date   GLUCOSE 119 (H) 10/17/2020   GLUCOSEU Negative 08/19/2019   HGBA1C 7.1 (H) 07/27/2019     Neuropathy Lab Results  Component Value Date   HGBA1C 7.1 (H) 07/27/2019     CNS No results found for: COLORCSF, APPEARCSF, RBCCOUNTCSF, WBCCSF, POLYSCSF, LYMPHSCSF, EOSCSF, PROTEINCSF, GLUCCSF, JCVIRUS, CSFOLI, IGGCSF, LABACHR, ACETBL, LABACHR, ACETBL   Inflammation (CRP: Acute  ESR: Chronic) No results found for: CRP, ESRSEDRATE, LATICACIDVEN   Rheumatology No results found for: RF, ANA, LABURIC, URICUR, LYMEIGGIGMAB, LYMEABIGMQN, HLAB27   Coagulation Lab Results  Component Value Date   INR 1.0 09/08/2020   LABPROT 13.2 09/08/2020   APTT 31 09/08/2020   PLT 326 07/26/2019     Cardiovascular Lab Results  Component Value Date   TROPONINI 0.03 08/12/2015   HGB 11.6 (L) 09/11/2020   HCT 34.0 (L) 09/11/2020     Screening Lab Results  Component  Value Date   SARSCOV2NAA NEGATIVE 09/08/2020     Cancer No results found for: CEA, CA125, LABCA2   Allergens No results found for: ALMOND, APPLE, ASPARAGUS, AVOCADO, BANANA, BARLEY, BASIL, B[place]LE[place], GREENBEAN, LIMABEAN, WHITEBEAN, BEEFIGE, REDBEET, BLUEBERRY, BROCCOLI, CABBAGE, MELON, CARROT, CASEIN, CASHEWNUT, CAULIFLOWER, CELERY     Note: Lab results reviewed.  PFSH  Drug: Mr. Winders  reports that he does not currently use drugs. Alcohol:  reports no history of alcohol use. Tobacco:  reports that he has never smoked. He has never used smokeless tobacco. Medical:  has a past medical history of Asthma, Bell's palsy, Bell's palsy, Cancer ([place]), Diabetes mellitus without complication ([place]), Dyspnea, History of kidney stones, Hyperlipemia, Hypertension, Kidney stone, Neuropathy, Obesity, and Sleep apnea. Family: family history includes Brain cancer in his father; COPD in his mother; Emphysema in his mother; Heart disease in his mother.  Past Surgical History:  Procedure Laterality Date   APPENDECTOMY     CATARACT EXTRACTION W/PHACO Left 05/20/2017   Procedure: CATARACT EXTRACTION PHACO AND INTRAOCULAR LENS PLACEMENT (IOC);  Surgeon: Birder Robson, MD;  Location: [facility];  Service: Ophthalmology;  Laterality: Left;  [place] 00:32.0 AP% 15.5 CDE 4.97 Fluid Pack Lot # [id number]   CATARACT EXTRACTION W/PHACO Right 06/10/2017   Procedure: CATARACT EXTRACTION PHACO AND INTRAOCULAR LENS PLACEMENT (IOC);  Surgeon: Birder Robson, MD;  Location: [facility];  Service: Ophthalmology;  Laterality: Right;  [place]  00:51 AP% 15.4 CDE 7.95 Fluid pack lot # [id number]   CHOLECYSTECTOMY     CYSTOSCOPY W/ RETROGRADES Right 08/13/2019   Procedure: CYSTOSCOPY WITH RETROGRADE PYELOGRAM;  Surgeon: Billey Co, MD;  Location: [facility];  Service: Urology;  Laterality: Right;   CYSTOSCOPY WITH STENT PLACEMENT Right 07/26/2019   Procedure: CYSTOSCOPY WITH STENT PLACEMENT;  Surgeon: Lucas Mallow, MD;  Location:  [facility];  Service: Urology;  Laterality: Right;   CYSTOSCOPY/URETEROSCOPY/HOLMIUM LASER/STENT PLACEMENT Right 08/13/2019   Procedure: CYSTOSCOPY/URETEROSCOPY/HOLMIUM LASER/STENT EXCHANGE;  Surgeon: Billey Co, MD;  Location: [facility];  Service: Urology;  Laterality: Right;   FRACTURE SURGERY     WRIST   HAND SURGERY     HERNIA REPAIR     SHOULDER ARTHROSCOPY WITH SUBACROMIAL DECOMPRESSION AND OPEN ROTATOR C Right 09/11/2020   Procedure: Right shoulder arthroscopic rotator cuff repair vs Regeneten patch application, subacromial decompression, and biceps tenodesis - Reche Dixon to Assist;  Surgeon: Leim Fabry, MD;  Location: [facility];  Service: Orthopedics;  Laterality: Right;   TONSILLECTOMY     Active Ambulatory Problems    Diagnosis Date Noted  Type 2 diabetes mellitus with renal manifestations ([place]) 03/19/2014   Sleep apnea 03/19/2014   Perennial allergic rhinitis 09/12/2014   Morbid obesity due to excess calories ([place]) 11/11/2014   Microalbuminuria 11/11/2014   Long-term insulin use ([place]) 11/11/2014   Hypertension 03/19/2014   Hyperlipemia 03/19/2014   Fatty liver 04/16/2018   Bilateral leg edema 03/17/2018   Aortic atherosclerosis ([place]) 04/16/2018   Anemia in chronic kidney disease 07/26/2019   Chronic kidney disease, stage III (moderate) ([place]) 07/26/2019   Edema of lower extremity 07/26/2019   Malignant essential hypertension 07/26/2019   Old complex tear of medial meniscus of right knee 10/07/2018   Primary osteoarthritis of right knee 10/07/2018   Secondary hyperparathyroidism of renal origin ([place]) 07/26/2019   Ureteral calculus 07/26/2019   Kidney stones 16/60/6301   Acute systolic CHF (congestive heart failure) ([place]) 10/03/2020   Right rotator cuff tear arthropathy 06/25/2021   Chronic right shoulder pain 06/25/2021   Localized osteoarthritis of right shoulder 06/25/2021   Chronic pain syndrome 06/25/2021   Resolved Ambulatory Problems    Diagnosis Date Noted    No Resolved Ambulatory Problems   Past Medical History:  Diagnosis Date   Asthma    Bell's palsy    Bell's palsy    Cancer ([place])    Diabetes mellitus without complication ([place])    Dyspnea    History of kidney stones    Kidney stone    Neuropathy    Obesity    Constitutional Exam  General appearance: Well nourished, well developed, and well hydrated. In no apparent acute distress Vitals:   06/25/21 1257  BP: (!) 165/79  Pulse: 84  Resp: 16  Temp: (!) 97 F (36.1 C)  TempSrc: Temporal  SpO2: 100%  Weight: (!) 327 lb 14.4 oz (148.7 kg)  Height: _0  (1.778 m)   BMI Assessment: Estimated body mass index is 47.05 kg/m as calculated from the following:   Height as of this encounter: _1  (1.778 m).   Weight as of this encounter: 327 lb 14.4 oz (148.7 kg).  BMI interpretation table: BMI level Category Range association with higher incidence of chronic pain  <18 kg/m2 Underweight   18.5-24.9 kg/m2 Ideal body weight   25-29.9 kg/m2 Overweight Increased incidence by 20%  30-34.9 kg/m2 Obese (Class I) Increased incidence by 68%  35-39.9 kg/m2 Severe obesity (Class II) Increased incidence by 136%  >40 kg/m2 Extreme obesity (Class III) Increased incidence by 254%   Patient's current BMI Ideal Body weight  Body mass index is 47.05 kg/m. Ideal body weight: 73 kg (160 lb 15 oz) Adjusted ideal body weight: 103.3 kg (227 lb 11.5 oz)   BMI Readings from Last 4 Encounters:  06/25/21 47.05 kg/m  05/01/21 47.21 kg/m  01/25/21 47.32 kg/m  11/17/20 49.01 kg/m   Wt Readings from Last 4 Encounters:  06/25/21 (!) 327 lb 14.4 oz (148.7 kg)  05/01/21 (!) 329 lb (149.2 kg)  01/25/21 (!) 329 lb 12.8 oz (149.6 kg)  11/17/20 (!) 341 lb 9.6 oz (154.9 kg)    Psych/Mental status: Alert, oriented x 3 (person, place, & time)       Eyes: PERLA Respiratory: No evidence of acute respiratory distress  Cervical Spine Area Exam  Skin & Axial Inspection: No masses, redness, edema,  swelling, or associated skin lesions Alignment: Symmetrical Functional ROM: Unrestricted ROM      Stability: No instability detected Muscle Tone/Strength: Functionally intact. No obvious neuro-muscular anomalies detected. Sensory (Neurological): Unimpaired Palpation: No palpable anomalies  Upper Extremity (UE) Exam    Side: Right upper extremity  Side: Left upper extremity  Skin & Extremity Inspection: Evidence of prior arthroplastic surgery  Skin & Extremity Inspection: Skin color, temperature, and hair growth are WNL. No peripheral edema or cyanosis. No masses, redness, swelling, asymmetry, or associated skin lesions. No contractures.  Functional ROM: Pain restricted ROM          Functional ROM: Unrestricted ROM          Muscle Tone/Strength: Functionally intact. No obvious neuro-muscular anomalies detected.  Muscle Tone/Strength: Functionally intact. No obvious neuro-muscular anomalies detected.  Sensory (Neurological): Arthropathic arthralgia          Sensory (Neurological): Unimpaired          Palpation: No palpable anomalies              Palpation: No palpable anomalies              Provocative Test(s):  Phalen's test: deferred Tinel's test: deferred Apley's scratch test (touch opposite shoulder):  Action 1 (Across chest): Decreased ROM Action 2 (Overhead): Decreased ROM Action 3 (LB reach): Decreased ROM   Provocative Test(s):  Phalen's test: deferred Tinel's test: deferred Apley's scratch test (touch opposite shoulder):  Action 1 (Across chest): deferred Action 2 (Overhead): deferred Action 3 (LB reach): deferred    Neuropathic pain of bilateral feet  Assessment  Primary Diagnosis & Pertinent Problem List: The primary encounter diagnosis was Right rotator cuff tear arthropathy. Diagnoses of Chronic right shoulder pain, Localized osteoarthritis of right shoulder, Stage 3 chronic kidney disease, unspecified whether stage 3a or 3b CKD ([place]), and Chronic pain syndrome  were also pertinent to this visit.  Visit Diagnosis (New problems to examiner): 1. Right rotator cuff tear arthropathy   2. Chronic right shoulder pain   3. Localized osteoarthritis of right shoulder   4. Stage 3 chronic kidney disease, unspecified whether stage 3a or 3b CKD ([place])   5. Chronic pain syndrome    Plan of Care (Initial workup plan)  General Recommendations: The pain condition that the patient suffers from is best treated with a multidisciplinary approach that involves an increase in physical activity to prevent de-conditioning and worsening of the pain cycle, as well as psychological counseling (formal and/or informal) to address the co-morbid psychological affects of pain. Treatment will often involve judicious use of pain medications and interventional procedures to decrease the pain, allowing the patient to participate in the physical activity that will ultimately produce long-lasting pain reductions. The goal of the multidisciplinary approach is to return the patient to a higher level of overall function and to restore their ability to perform activities of daily living.   1. Right rotator cuff tear arthropathy -History of right shoulder surgery February 2022 with persistent right shoulder pain and limited range of motion.  Status post physical therapy and right shoulder steroid injection.  Discussed suprascapular nerve block, diagnostic and if helpful can further discuss suprascapular nerve peripheral stimulation versus radiofrequency ablation of suprascapular nerve. - SUPRASCAPULAR NERVE BLOCK; Future  2. Chronic right shoulder pain --History of right shoulder surgery February 2022 with persistent right shoulder pain and limited range of motion.  Status post physical therapy and right shoulder steroid injection.  Discussed suprascapular nerve block, diagnostic and if helpful can further discuss suprascapular nerve peripheral stimulation versus radiofrequency ablation of  suprascapular nerve. - SUPRASCAPULAR NERVE BLOCK; Future  3. Localized osteoarthritis of right shoulder --History of right shoulder surgery February 2022 with persistent right shoulder  pain and limited range of motion.  Status post physical therapy and right shoulder steroid injection.  Discussed suprascapular nerve block, diagnostic and if helpful can further discuss suprascapular nerve peripheral stimulation versus radiofrequency ablation of suprascapular nerve. - SUPRASCAPULAR NERVE BLOCK; Future  4. Stage 3 chronic kidney disease, unspecified whether stage 3a or 3b CKD (HCC) -Avoid NSAIDs, renally dose medications  5. Chronic pain syndrome - SUPRASCAPULAR NERVE BLOCK; Future - Compliance Drug Analysis, Ur  6. Consider Qutenza for PDN.   Procedure Orders         SUPRASCAPULAR NERVE BLOCK      Pharmacological management options:  Opioid Analgesics: The patient was informed that there is no guarantee that he would be a candidate for opioid analgesics. The decision will be made following CDC guidelines. This decision will be based on the results of diagnostic studies, as well as Mr. Signorelli's risk profile.   Membrane stabilizer: To be determined at a later time consider Lyrica, gabapentin  Muscle relaxant: To be determined at a later time  NSAID: Medically contraindicated  Other analgesic(s): To be determined at a later time   Interventional management options: Mr. Longton was informed that there is no guarantee that he would be a candidate for interventional therapies. The decision will be based on the results of diagnostic studies, as well as Mr. Allington's risk profile.  Procedure(s) under consideration:  Suprascapular nerve block Suprascapular nerve radiofrequency ablation Suprascapular nerve peripheral nerve stimulation Qutenza for PDN    Provider-requested follow-up: Return in about 9 days (around 07/04/2021) for Right SSNB #1, without sedation.   I spent a total of 60 minutes  reviewing chart data, face-to-face evaluation with the patient, counseling and coordination of care as detailed above.  Future Appointments  Date Time Provider [place]  04/30/2022  1:00 PM Dew, Erskine Squibb, MD [facility] None    Note by: Gillis Santa, MD Date: 06/25/2021; Time: 1:42 PM

## 2021-06-26 DIAGNOSIS — E1142 Type 2 diabetes mellitus with diabetic polyneuropathy: Secondary | ICD-10-CM | POA: Diagnosis not present

## 2021-06-26 DIAGNOSIS — N184 Chronic kidney disease, stage 4 (severe): Secondary | ICD-10-CM | POA: Diagnosis not present

## 2021-06-26 DIAGNOSIS — I10 Essential (primary) hypertension: Secondary | ICD-10-CM | POA: Diagnosis not present

## 2021-06-26 DIAGNOSIS — G8929 Other chronic pain: Secondary | ICD-10-CM | POA: Diagnosis not present

## 2021-06-26 DIAGNOSIS — E1122 Type 2 diabetes mellitus with diabetic chronic kidney disease: Secondary | ICD-10-CM | POA: Diagnosis not present

## 2021-06-26 DIAGNOSIS — I129 Hypertensive chronic kidney disease with stage 1 through stage 4 chronic kidney disease, or unspecified chronic kidney disease: Secondary | ICD-10-CM | POA: Diagnosis not present

## 2021-06-26 DIAGNOSIS — M25511 Pain in right shoulder: Secondary | ICD-10-CM | POA: Diagnosis not present

## 2021-06-27 ENCOUNTER — Encounter: Payer: Self-pay | Admitting: Student in an Organized Health Care Education/Training Program

## 2021-06-27 ENCOUNTER — Ambulatory Visit (HOSPITAL_BASED_OUTPATIENT_CLINIC_OR_DEPARTMENT_OTHER): Payer: PPO | Admitting: Student in an Organized Health Care Education/Training Program

## 2021-06-27 ENCOUNTER — Other Ambulatory Visit: Payer: Self-pay

## 2021-06-27 ENCOUNTER — Ambulatory Visit
Admission: RE | Admit: 2021-06-27 | Discharge: 2021-06-27 | Disposition: A | Discharge status: Home / Self Care | Payer: PPO | Source: Ambulatory Visit | Attending: Student in an Organized Health Care Education/Training Program | Admitting: Student in an Organized Health Care Education/Training Program

## 2021-06-27 VITALS — BP 146/80 | HR 68 | Temp 98.6°F | Resp 22 | Ht 70.0 in | Wt 327.0 lb

## 2021-06-27 DIAGNOSIS — G8929 Other chronic pain: Secondary | ICD-10-CM | POA: Insufficient documentation

## 2021-06-27 DIAGNOSIS — M75101 Unspecified rotator cuff tear or rupture of right shoulder, not specified as traumatic: Secondary | ICD-10-CM

## 2021-06-27 DIAGNOSIS — M19011 Primary osteoarthritis, right shoulder: Secondary | ICD-10-CM | POA: Insufficient documentation

## 2021-06-27 DIAGNOSIS — G894 Chronic pain syndrome: Secondary | ICD-10-CM | POA: Diagnosis not present

## 2021-06-27 DIAGNOSIS — M25511 Pain in right shoulder: Secondary | ICD-10-CM

## 2021-06-27 DIAGNOSIS — M12811 Other specific arthropathies, not elsewhere classified, right shoulder: Secondary | ICD-10-CM | POA: Insufficient documentation

## 2021-06-27 MED ORDER — ROPIVACAINE HCL 2 MG/ML IJ SOLN
4.0000 mL | Freq: Once | INTRAMUSCULAR | Status: AC
  Administered 2021-06-27: 4 mL via PERINEURAL

## 2021-06-27 MED ORDER — LIDOCAINE HCL 2 % IJ SOLN
20.0000 mL | Freq: Once | INTRAMUSCULAR | Status: AC
  Administered 2021-06-27: 400 mg

## 2021-06-27 MED ORDER — METHYLPREDNISOLONE ACETATE 80 MG/ML IJ SUSP
80.0000 mg | Freq: Once | INTRAMUSCULAR | Status: AC
  Administered 2021-06-27: 80 mg
  Filled 2021-06-27: qty 1

## 2021-06-27 MED ORDER — ROPIVACAINE HCL 2 MG/ML IJ SOLN
INTRAMUSCULAR | Status: AC
  Filled 2021-06-27: qty 20

## 2021-06-27 MED ORDER — IOPAMIDOL (ISOVUE-M 200) INJECTION 41%
10.0000 mL | Freq: Once | INTRAMUSCULAR | Status: AC
  Administered 2021-06-27: 10 mL via INTRA_ARTICULAR

## 2021-06-27 NOTE — Patient Instructions (Signed)

## 2021-06-27 NOTE — Progress Notes (Signed)
Safety precautions to be maintained throughout the outpatient stay will include: orient to surroundings, keep bed in low position, maintain call bell within reach at all times, provide assistance with transfer out of bed and ambulation.  

## 2021-06-27 NOTE — Progress Notes (Signed)
PROVIDER NOTE: Information contained herein reflects review and annotations entered in association with encounter. Interpretation of such information and data should be left to medically-trained personnel. Information provided to patient can be located elsewhere in the medical record under "Patient Instructions". Document created using STT-dictation technology, any transcriptional errors that may result from process are unintentional.    Patient: James Clarke  Service Category: Procedure Provider: Gillis Santa, MD DOB: 1941/04/23 DOS: 06/27/2021 Location: [facility] MRN: [id number] Setting: Ambulatory - outpatient Referring Provider: Sofie Hartigan, MD Type: Established Patient Specialty: Interventional Pain Management PCP: Sofie Hartigan, MD  Primary Reason for Visit: Interventional Pain Management Treatment. CC: Shoulder Pain (right)    Procedure:          Anesthesia, Analgesia, Anxiolysis:  Type: Diagnostic Suprascapular nerve Block #1  Primary Purpose: Diagnostic Region: Posterior Shoulder & Scapular Areas Level: Superior to the scapular spine, in the lateral aspect of the supraspinatus fossa (Suprescapular notch). Target Area: Suprascapular nerve as it passes thru the lower portion of the suprascapular notch. Approach: Posterior percutaneous approach. Laterality: Right-Side  Anesthesia: Local (1-2% Lidocaine)  Anxiolysis: None  Sedation: None  Guidance: Fluoroscopy           Position: Prone   Indications: 1. Right rotator cuff tear arthropathy   2. Chronic right shoulder pain   3. Localized osteoarthritis of right shoulder   4. Chronic pain syndrome    Pain Score: Pre-procedure: 5 /10 Post-procedure: 5 /10      Pre-op H&P Assessment:  Mr. Strick is a 80 y.o. (year old), male patient, seen today for interventional treatment. He  has a past surgical history that includes Cholecystectomy; Hernia repair; Appendectomy; Fracture surgery; Tonsillectomy;  Hand surgery; Cataract extraction w/PHACO (Left, 05/20/2017); Cataract extraction w/PHACO (Right, 06/10/2017); Cystoscopy/ureteroscopy/holmium laser/stent placement (Right, 08/13/2019); Cystoscopy w/ retrogrades (Right, 08/13/2019); Cystoscopy with stent placement (Right, 07/26/2019); and Shoulder arthroscopy with subacromial decompression and open rotator cuff repair, open bicep tendon repair (Right, 09/11/2020). Mr. Dunaj has a current medication list which includes the following prescription(s): acetaminophen, albuterol, amlodipine besylate, calcitriol, furosemide, global inject ease insulin syr, onetouch ultra, insulin aspart, insulin detemir, jardiance, onetouch delica plus [id number], lovastatin, metoprolol succinate, potassium chloride sa, tramadol, valsartan, and amlodipine-olmesartan. His primarily concern today is the Shoulder Pain (right)  Initial Vital Signs:  Pulse/HCG Rate: 68ECG Heart Rate: 74 Temp: 98.6 F (37 C) Resp: 20 BP: (!) 152/70 SpO2: 99 %  BMI: Estimated body mass index is 46.92 kg/m as calculated from the following:   Height as of this encounter: 5\' 10"  (1.778 m).   Weight as of this encounter: 327 lb (148.3 kg).  Risk Assessment: Allergies: Reviewed. He is allergic to codeine, doxycycline, and erythromycin.  Allergy Precautions: None required Coagulopathies: Reviewed. None identified.  Blood-thinner therapy: None at this time Active Infection(s): Reviewed. None identified. Mr. Radu is afebrile  Site Confirmation: Mr. Fei was asked to confirm the procedure and laterality before marking the site Procedure checklist: Completed Consent: Before the procedure and under the influence of no sedative(s), amnesic(s), or anxiolytics, the patient was informed of the treatment options, risks and possible complications. To fulfill our ethical and legal obligations, as recommended by the American Medical Association's Code of Ethics, I have informed the patient of my clinical  impression; the nature and purpose of the treatment or procedure; the risks, benefits, and possible complications of the intervention; the alternatives, including doing nothing; the risk(s) and benefit(s) of the alternative treatment(s) or procedure(s); and the risk(s)  and benefit(s) of doing nothing. The patient was provided information about the general risks and possible complications associated with the procedure. These may include, but are not limited to: failure to achieve desired goals, infection, bleeding, organ or nerve damage, allergic reactions, paralysis, and death. In addition, the patient was informed of those risks and complications associated to the procedure, such as failure to decrease pain; infection; bleeding; organ or nerve damage with subsequent damage to sensory, motor, and/or autonomic systems, resulting in permanent pain, numbness, and/or weakness of one or several areas of the body; allergic reactions; (i.e.: anaphylactic reaction); and/or death. Furthermore, the patient was informed of those risks and complications associated with the medications. These include, but are not limited to: allergic reactions (i.e.: anaphylactic or anaphylactoid reaction(s)); adrenal axis suppression; blood sugar elevation that in diabetics may result in ketoacidosis or comma; water retention that in patients with history of congestive heart failure may result in shortness of breath, pulmonary edema, and decompensation with resultant heart failure; weight gain; swelling or edema; medication-induced neural toxicity; particulate matter embolism and blood vessel occlusion with resultant organ, and/or nervous system infarction; and/or aseptic necrosis of one or more joints. Finally, the patient was informed that Medicine is not an exact science; therefore, there is also the possibility of unforeseen or unpredictable risks and/or possible complications that may result in a catastrophic outcome. The patient  indicated having understood very clearly. We have given the patient no guarantees and we have made no promises. Enough time was given to the patient to ask questions, all of which were answered to the patient's satisfaction. Mr. Sandate has indicated that he wanted to continue with the procedure. Attestation: I, the ordering provider, attest that I have discussed with the patient the benefits, risks, side-effects, alternatives, likelihood of achieving goals, and potential problems during recovery for the procedure that I have provided informed consent. Date  Time: 06/27/2021  8:44 AM  Pre-Procedure Preparation:  Monitoring: As per clinic protocol. Respiration, ETCO2, SpO2, BP, heart rate and rhythm monitor placed and checked for adequate function Safety Precautions: Patient was assessed for positional comfort and pressure points before starting the procedure. Time-out: I initiated and conducted the "Time-out" before starting the procedure, as per protocol. The patient was asked to participate by confirming the accuracy of the "Time Out" information. Verification of the correct person, site, and procedure were performed and confirmed by me, the nursing staff, and the patient. "Time-out" conducted as per Joint Commission's Universal Protocol (UP.01.01.01). Time: 0919  Description of Procedure:          Area Prepped: Entire shoulder Area DuraPrep (Iodine Povacrylex [0.7% available iodine] and Isopropyl Alcohol, 74% w/w) Safety Precautions: Aspiration looking for blood return was conducted prior to all injections. At no point did we inject any substances, as a needle was being advanced. No attempts were made at seeking any paresthesias. Safe injection practices and needle disposal techniques used. Medications properly checked for expiration dates. SDV (single dose vial) medications used. Description of the Procedure: Protocol guidelines were followed. The patient was placed in position over the procedure  table. The target area was identified and the area prepped in the usual manner. Skin & deeper tissues infiltrated with local anesthetic. Appropriate amount of time allowed to pass for local anesthetics to take effect. The procedure needles were then advanced to the target area. Proper needle placement secured. Negative aspiration confirmed. Solution injected in intermittent fashion, asking for systemic symptoms every 0.5cc of injectate. The needles were then removed  and the area cleansed, making sure to leave some of the prepping solution back to take advantage of its long term bactericidal properties.  Vitals:   06/27/21 0849 06/27/21 0917 06/27/21 0922 06/27/21 0924  BP: (!) 152/70 (!) 151/72 (!) 148/76 (!) 146/80  Pulse: 68     Resp: 20 (!) 24 (!) 25 (!) 22  Temp: 98.6 F (37 C)     TempSrc: Oral     SpO2: 99% 100% 99% 99%  Weight: (!) 327 lb (148.3 kg)     Height: 5\' 10"  (1.778 m)       Start Time: 0919 hrs. End Time: 0924 hrs. Materials:  Needle(s) Type: Spinal Needle Gauge: 22G Length: 3.5-in Medication(s): Please see orders for medications and dosing details. 5 cc solution made of 4 cc of 0.2% ropivacaine, 1 cc of methylprednisolone, 80 mg/cc. Imaging Guidance (Non-Spinal):          Type of Imaging Technique: Fluoroscopy Guidance (Non-Spinal) Indication(s): Assistance in needle guidance and placement for procedures requiring needle placement in or near specific anatomical locations not easily accessible without such assistance. Exposure Time: Please see nurses notes. Contrast: Before injecting any contrast, we confirmed that the patient did not have an allergy to iodine, shellfish, or radiological contrast. Once satisfactory needle placement was completed at the desired level, radiological contrast was injected. Contrast injected under live fluoroscopy. No contrast complications. See chart for type and volume of contrast used. Fluoroscopic Guidance: I was personally present during  the use of fluoroscopy. "Tunnel Vision Technique" used to obtain the best possible view of the target area. Parallax error corrected before commencing the procedure. "Direction-depth-direction" technique used to introduce the needle under continuous pulsed fluoroscopy. Once target was reached, antero-posterior, oblique, and lateral fluoroscopic projection used confirm needle placement in all planes. Images permanently stored in EMR. Interpretation: I personally interpreted the imaging intraoperatively. Adequate needle placement confirmed in multiple planes. Appropriate spread of contrast into desired area was observed. No evidence of afferent or efferent intravascular uptake. Permanent images saved into the patient's record.   Post-operative Assessment:  Post-procedure Vital Signs:  Pulse/HCG Rate: 6874 Temp: 98.6 F (37 C) Resp: (!) 22 BP: (!) 146/80 SpO2: 99 %  EBL: None  Complications: No immediate post-treatment complications observed by team, or reported by patient.  Note: The patient tolerated the entire procedure well. A repeat set of vitals were taken after the procedure and the patient was kept under observation following institutional policy, for this type of procedure. Post-procedural neurological assessment was performed, showing return to baseline, prior to discharge. The patient was provided with post-procedure discharge instructions, including a section on how to identify potential problems. Should any problems arise concerning this procedure, the patient was given instructions to immediately contact us, at any time, without hesitation. In any case, we plan to contact the patient by telephone for a follow-up status report regarding this interventional procedure.  Comments:  No additional relevant information.  Plan of Care  Orders:  Orders Placed This Encounter  Procedures   DG PAIN [facility] C-ARM 1-60 MIN NO REPORT    Intraoperative interpretation by procedural physician at  [place].    Standing Status:   Standing    Number of Occurrences:   1    Order Specific Question:   Reason for exam:    Answer:   Assistance in needle guidance and placement for procedures requiring needle placement in or near specific anatomical locations not easily accessible without such assistance.  Medications ordered for procedure: Meds ordered this encounter  Medications   ropivacaine (PF) 2 mg/mL (0.2%) (NAROPIN) injection 4 mL   methylPREDNISolone acetate (DEPO-MEDROL) injection 80 mg   iopamidol (ISOVUE-M) 41 % intrathecal injection 10 mL    Must be Myelogram-compatible. If not available, you may substitute with a water-soluble, non-ionic, hypoallergenic, myelogram-compatible radiological contrast medium.   lidocaine (XYLOCAINE) 2 % (with pres) injection 400 mg    Medications administered: We administered ropivacaine (PF) 2 mg/mL (0.2%), methylPREDNISolone acetate, iopamidol, and lidocaine.  See the medical record for exact dosing, route, and time of administration.  Follow-up plan:   Return in about 4 weeks (around 07/25/2021) for Post Procedure Evaluation, virtual.     Right SSNB #1 06/27/21  Recent Visits Date Type Provider Dept  06/25/21 Office Visit Gillis Santa, MD [facility]  Showing recent visits within past 90 days and meeting all other requirements Today's Visits Date Type Provider Dept  06/27/21 Procedure visit Gillis Santa, MD [facility]  Showing today's visits and meeting all other requirements Future Appointments Date Type Provider Dept  07/25/21 Appointment Gillis Santa, MD [facility]  Showing future appointments within next 90 days and meeting all other requirements Disposition: Discharge home  Discharge (Date  Time): 06/27/2021; 0935 hrs.   Primary Care Physician: Sofie Hartigan, MD Location: [facility] Note by: Gillis Santa, MD Date: 06/27/2021; Time: 9:48  AM  Disclaimer:  Medicine is not an exact science. The only guarantee in medicine is that nothing is guaranteed. It is important to note that the decision to proceed with this intervention was based on the information collected from the patient. The Data and conclusions were drawn from the patient's questionnaire, the interview, and the physical examination. Because the information was provided in large part by the patient, it cannot be guaranteed that it has not been purposely or unconsciously manipulated. Every effort has been made to obtain as much relevant data as possible for this evaluation. It is important to note that the conclusions that lead to this procedure are derived in large part from the available data. Always take into account that the treatment will also be dependent on availability of resources and existing treatment guidelines, considered by other Pain Management Practitioners as being common knowledge and practice, at the time of the intervention. For Medico-Legal purposes, it is also important to point out that variation in procedural techniques and pharmacological choices are the acceptable norm. The indications, contraindications, technique, and results of the above procedure should only be interpreted and judged by a Board-Certified Interventional Pain Specialist with extensive familiarity and expertise in the same exact procedure and technique.

## 2021-06-28 ENCOUNTER — Telehealth: Payer: Self-pay

## 2021-06-28 NOTE — Telephone Encounter (Signed)
Post procedure phone call. Patient states he is doing well.  

## 2021-07-01 LAB — COMPLIANCE DRUG ANALYSIS, UR

## 2021-07-05 DIAGNOSIS — N184 Chronic kidney disease, stage 4 (severe): Secondary | ICD-10-CM | POA: Diagnosis not present

## 2021-07-05 DIAGNOSIS — R600 Localized edema: Secondary | ICD-10-CM | POA: Diagnosis not present

## 2021-07-05 DIAGNOSIS — E1122 Type 2 diabetes mellitus with diabetic chronic kidney disease: Secondary | ICD-10-CM | POA: Diagnosis not present

## 2021-07-05 DIAGNOSIS — I10 Essential (primary) hypertension: Secondary | ICD-10-CM | POA: Diagnosis not present

## 2021-07-05 DIAGNOSIS — D631 Anemia in chronic kidney disease: Secondary | ICD-10-CM | POA: Diagnosis not present

## 2021-07-05 DIAGNOSIS — N2581 Secondary hyperparathyroidism of renal origin: Secondary | ICD-10-CM | POA: Diagnosis not present

## 2021-07-25 ENCOUNTER — Other Ambulatory Visit: Payer: Self-pay

## 2021-07-25 ENCOUNTER — Ambulatory Visit
Payer: PPO | Attending: Student in an Organized Health Care Education/Training Program | Admitting: Student in an Organized Health Care Education/Training Program

## 2021-07-25 ENCOUNTER — Encounter: Payer: Self-pay | Admitting: Student in an Organized Health Care Education/Training Program

## 2021-07-25 DIAGNOSIS — G8929 Other chronic pain: Secondary | ICD-10-CM

## 2021-07-25 DIAGNOSIS — M12811 Other specific arthropathies, not elsewhere classified, right shoulder: Secondary | ICD-10-CM

## 2021-07-25 DIAGNOSIS — M19011 Primary osteoarthritis, right shoulder: Secondary | ICD-10-CM | POA: Diagnosis not present

## 2021-07-25 DIAGNOSIS — M25511 Pain in right shoulder: Secondary | ICD-10-CM

## 2021-07-25 DIAGNOSIS — M75101 Unspecified rotator cuff tear or rupture of right shoulder, not specified as traumatic: Secondary | ICD-10-CM

## 2021-07-25 DIAGNOSIS — N183 Chronic kidney disease, stage 3 unspecified: Secondary | ICD-10-CM

## 2021-07-25 DIAGNOSIS — G894 Chronic pain syndrome: Secondary | ICD-10-CM

## 2021-07-25 MED ORDER — TRAMADOL HCL 50 MG PO TABS: 50.0000 mg | ORAL_TABLET | Freq: Two times a day (BID) | ORAL | 0 refills | Status: AC | PRN

## 2021-07-25 NOTE — Progress Notes (Signed)
Patient: James Clarke  Service Category: E/M  Provider: Gillis Santa, MD  DOB: Dec 16, 1940  DOS: 07/25/2021  Location: Office  MRN: [id number]  Setting: Ambulatory outpatient  Referring Provider: Sofie Hartigan, MD  Type: Established Patient  Specialty: Interventional Pain Management  PCP: James Hartigan, MD  Location: Home  Delivery: TeleHealth     Virtual Encounter - Pain Management PROVIDER NOTE: Information contained herein reflects review and annotations entered in association with encounter. Interpretation of such information and data should be left to medically-trained personnel. Information provided to patient can be located elsewhere in the medical record under "Patient Instructions". Document created using STT-dictation technology, any transcriptional errors that may result from process are unintentional.    Contact & Pharmacy Preferred: [phone] Home: [phone] (home) Mobile: [phone] (mobile) E-mail: [email or web address]  James Clarke, James Clarke [address] Phone: [phone] Fax: [place], [place] [address] Phone: [phone] Fax: [place], [place] [address] [place] [facility] [place] [address] Phone: [phone] Fax: [phone]   Pre-screening  James Clarke offered "in-person" vs "virtual" encounter. He indicated preferring virtual for this encounter.   Reason COVID-19*   Social distancing based on CDC and AMA recommendations.   I contacted James Clarke on 07/25/2021 via telephone.      I clearly identified myself as James Santa, MD. I verified that I was speaking with the correct person using two identifiers (Name: James Clarke, and date of birth: [birth year]).  Consent I sought verbal advanced consent from James Clarke for virtual  visit interactions. I informed James Clarke of possible security and privacy concerns, risks, and limitations associated with providing "not-in-person" medical evaluation and management services. I also informed James Clarke of the availability of "in-person" appointments. Finally, I informed him that there would be a charge for the virtual visit and that he could be  personally, fully or partially, financially responsible for it. James Clarke expressed understanding and agreed to proceed.   Historic Elements   James Clarke is a 81 y.o. year old, male patient evaluated today after our last contact on 06/27/2021. James Clarke  has a past medical history of Asthma, Bell's palsy, Bell's palsy, Cancer ([place]), Diabetes mellitus without complication ([place]), Dyspnea, History of kidney stones, Hyperlipemia, Hypertension, Kidney stone, Neuropathy, Obesity, and Sleep apnea. He also  has a past surgical history that includes Cholecystectomy; Hernia repair; Appendectomy; Fracture surgery; Tonsillectomy; Hand surgery; Cataract extraction w/PHACO (Left, 05/20/2017); Cataract extraction w/PHACO (Right, 06/10/2017); Cystoscopy/ureteroscopy/holmium laser/stent placement (Right, 08/13/2019); Cystoscopy w/ retrogrades (Right, 08/13/2019); Cystoscopy with stent placement (Right, 07/26/2019); and Shoulder arthroscopy with subacromial decompression and open rotator cuff repair, open bicep tendon repair (Right, 09/11/2020). James Clarke has a current medication list which includes the following prescription(s): acetaminophen, albuterol, amlodipine besylate, calcitriol, furosemide, global inject ease insulin syr, onetouch ultra, insulin aspart, insulin detemir, jardiance, onetouch delica plus [id number], lovastatin, metoprolol succinate, potassium chloride sa, and tramadol. He  reports that he has never smoked. He has never used smokeless tobacco. He reports that he does not currently use drugs. He reports that he does not drink alcohol. James Clarke is allergic  to codeine, doxycycline, and erythromycin.   HPI  Today, he is being contacted for both, medication management and a  post-procedure assessment.  Post-procedure evaluation     Procedure:          Anesthesia, Analgesia, Anxiolysis:  Type: Diagnostic Suprascapular nerve Block #1  Primary Purpose: Diagnostic Region: Posterior Shoulder & Scapular Areas Level: Superior to the scapular spine, in the lateral aspect of the supraspinatus fossa (Suprescapular notch). Target Area: Suprascapular nerve as it passes thru the lower portion of the suprascapular notch. Approach: Posterior percutaneous approach. Laterality: Right-Side  Anesthesia: Local (1-2% Lidocaine)  Anxiolysis: None  Sedation: None  Guidance: Fluoroscopy           Position: Prone   Indications: 1. Right rotator cuff tear arthropathy   2. Chronic right shoulder pain   3. Localized osteoarthritis of right shoulder   4. Chronic pain syndrome    Pain Score: Pre-procedure: 5 /10 Post-procedure: 5 /10      Effectiveness:  Initial hour after procedure: 100 %  Subsequent 4-6 hours post-procedure: 80 % (2-3 days) Analgesia past initial 6 hours: 50 % (current)  Ongoing improvement:  Analgesic:  30-40% Function: Back to baseline ROM: Back to baseline   Pharmacotherapy Assessment   Opioid Analgesic: Tramadol 50 mg daily prn, takes very seldomly (patient requesting Rx today which will be his 1st one)  Monitoring: [place] PMP: PDMP reviewed during this encounter.       Pharmacotherapy: No side-effects or adverse reactions reported. Compliance: No problems identified. Effectiveness: Clinically acceptable. Plan: Refer to "POC". UDS:  Summary  Date Value Ref Range Status  06/25/2021 Note  Final    Comment:    ==================================================================== Compliance Drug Analysis, Ur ==================================================================== Test                             Result       Flag        Units  Drug Present and Declared for Prescription Verification   Acetaminophen                  PRESENT      EXPECTED   Metoprolol                     PRESENT      EXPECTED  Drug Absent but Declared for Prescription Verification   Tramadol                       Not Detected UNEXPECTED ng/mg creat ==================================================================== Test                      Result    Flag   Units      Ref Range   Creatinine              91               mg/dL      >=20 ==================================================================== Declared Medications:  The flagging and interpretation on this report are based on the  following declared medications.  Unexpected results may arise from  inaccuracies in the declared medications.   **Note: The testing scope of this panel includes these medications:   Metoprolol (Toprol)  Tramadol (Ultram)   **Note: The testing scope of this panel does not include small to  moderate amounts of these reported medications:   Acetaminophen (Tylenol)   **Note: The testing scope of this panel does not include the  following reported medications:   Albuterol (Ventolin HFA)  Amlodipine  Amlodipine Besylate (  Azor)  Calcitriol  Empagliflozin (Jardiance)  Furosemide (Lasix)  Insulin (NovoLog)  Lovastatin (Mevacor)  Olmesartan (Azor)  Potassium (Klor-Con)  Valsartan (Diovan) ==================================================================== For clinical consultation, please call [phone]. ====================================================================      Laboratory Chemistry Profile   Renal Lab Results  Component Value Date   BUN 79 (H) 10/17/2020   CREATININE 2.35 (H) 10/17/2020   GFRAA 30 (L) 03/13/2020   GFRNONAA 27 (L) 10/17/2020    Hepatic Lab Results  Component Value Date   AST 18 03/13/2020   ALT 14 03/13/2020   ALBUMIN 3.4 (L) 10/17/2020   ALKPHOS 65 03/13/2020   LIPASE 28 08/12/2015     Electrolytes Lab Results  Component Value Date   NA 139 10/17/2020   K 3.6 10/17/2020   CL 100 10/17/2020   CALCIUM 9.6 10/17/2020   PHOS 3.5 10/17/2020    Bone No results found for: VD25OH, VD125OH2TOT, TZ0017CB4, WH6759FM3, 25OHVITD1, 25OHVITD2, 25OHVITD3, TESTOFREE, TESTOSTERONE  Inflammation (CRP: Acute Phase) (ESR: Chronic Phase) No results found for: CRP, ESRSEDRATE, LATICACIDVEN       Note: Above Lab results reviewed.   Assessment  The primary encounter diagnosis was Right rotator cuff tear arthropathy. Diagnoses of Chronic right shoulder pain, Localized osteoarthritis of right shoulder, Stage 3 chronic kidney disease, unspecified whether stage 3a or 3b CKD ([employer]), and Chronic pain syndrome were also pertinent to this visit.  Plan of Care    Mr. Burney Calzadilla has a current medication list which includes the following long-term medication(s): albuterol, furosemide, insulin aspart, insulin detemir, lovastatin, metoprolol succinate, and potassium chloride sa.   1. Right rotator cuff tear arthropathy -Repeat suprascapular nerve block #2, consider pulsed RFA versus peripheral nerve stim in future - SUPRASCAPULAR NERVE BLOCK; Future - traMADol (ULTRAM) 50 MG tablet; Take 1 tablet (50 mg total) by mouth every 12 (twelve) hours as needed for severe pain.  Dispense: 60 tablet; Refill: 0  2. Chronic right shoulder pain --Repeat suprascapular nerve block #2, consider pulsed RFA versus peripheral nerve stim in future - SUPRASCAPULAR NERVE BLOCK; Future - traMADol (ULTRAM) 50 MG tablet; Take 1 tablet (50 mg total) by mouth every 12 (twelve) hours as needed for severe pain.  Dispense: 60 tablet; Refill: 0  3. Localized osteoarthritis of right shoulder --Repeat suprascapular nerve block #2, consider pulsed RFA versus peripheral nerve stim in future ASCAPULAR NERVE BLOCK; Future - traMADol (ULTRAM) 50 MG tablet; Take 1 tablet (50 mg total) by mouth every 12 (twelve) hours as needed  for severe pain.  Dispense: 60 tablet; Refill: 0  4. Stage 3 chronic kidney disease, unspecified whether stage 3a or 3b CKD ([place]) -followed by nephro  5. Chronic pain syndrome - SUPRASCAPULAR NERVE BLOCK; Future - traMADol (ULTRAM) 50 MG tablet; Take 1 tablet (50 mg total) by mouth every 12 (twelve) hours as needed for severe pain.  Dispense: 60 tablet; Refill: 0  Pharmacotherapy (Medications Ordered): Meds ordered this encounter  Medications   traMADol (ULTRAM) 50 MG tablet    Sig: Take 1 tablet (50 mg total) by mouth every 12 (twelve) hours as needed for severe pain.    Dispense:  60 tablet    Refill:  0    Fill one day early if pharmacy is closed on scheduled refill date. Do not fill until: To last until:   Orders:  Orders Placed This Encounter  Procedures   SUPRASCAPULAR NERVE BLOCK    For shoulder pain.    Standing Status:   Future    Standing  Expiration Date:   10/23/2021    Scheduling Instructions:     Right SSNB #2    Order Specific Question:   Where will this procedure be performed?    Answer:   [facility]   Follow-up plan:   Return in about 1 week (around 08/01/2021) for R SSNB #2, without sedation.     Right SSNB #1 06/27/21   Recent Visits Date Type Provider Dept  06/27/21 Procedure visit James Santa, MD [facility]  06/25/21 Office Visit James Santa, MD [facility]  Showing recent visits within past 90 days and meeting all other requirements Today's Visits Date Type Provider Dept  07/25/21 Office Visit James Santa, MD [facility]  Showing today's visits and meeting all other requirements Future Appointments No visits were found meeting these conditions. Showing future appointments within next 90 days and meeting all other requirements  I discussed the assessment and treatment plan with the patient. The patient was provided an opportunity to ask questions and all were answered. The patient agreed with the plan and  demonstrated an understanding of the instructions.  Patient advised to call back or seek an in-person evaluation if the symptoms or condition worsens.  Duration of encounter: 30mnutes.  Note by: BGillis Santa MD Date: 07/25/2021; Time: 2:33 PM

## 2021-07-26 ENCOUNTER — Telehealth: Payer: Self-pay

## 2021-07-26 NOTE — Telephone Encounter (Signed)
LM for patient to call office for pre procedure instructions.

## 2021-07-31 DIAGNOSIS — I5021 Acute systolic (congestive) heart failure: Secondary | ICD-10-CM | POA: Diagnosis not present

## 2021-07-31 DIAGNOSIS — E1122 Type 2 diabetes mellitus with diabetic chronic kidney disease: Secondary | ICD-10-CM | POA: Diagnosis not present

## 2021-07-31 DIAGNOSIS — E7800 Pure hypercholesterolemia, unspecified: Secondary | ICD-10-CM | POA: Diagnosis not present

## 2021-07-31 DIAGNOSIS — J301 Allergic rhinitis due to pollen: Secondary | ICD-10-CM | POA: Diagnosis not present

## 2021-07-31 DIAGNOSIS — N184 Chronic kidney disease, stage 4 (severe): Secondary | ICD-10-CM | POA: Diagnosis not present

## 2021-07-31 DIAGNOSIS — R6889 Other general symptoms and signs: Secondary | ICD-10-CM | POA: Diagnosis not present

## 2021-07-31 DIAGNOSIS — G4733 Obstructive sleep apnea (adult) (pediatric): Secondary | ICD-10-CM | POA: Diagnosis not present

## 2021-07-31 DIAGNOSIS — Z0000 Encounter for general adult medical examination without abnormal findings: Secondary | ICD-10-CM | POA: Diagnosis not present

## 2021-07-31 DIAGNOSIS — M25511 Pain in right shoulder: Secondary | ICD-10-CM | POA: Diagnosis not present

## 2021-07-31 DIAGNOSIS — I10 Essential (primary) hypertension: Secondary | ICD-10-CM | POA: Diagnosis not present

## 2021-07-31 DIAGNOSIS — I700 Atherosclerosis of aorta: Secondary | ICD-10-CM | POA: Diagnosis not present

## 2021-07-31 DIAGNOSIS — N2581 Secondary hyperparathyroidism of renal origin: Secondary | ICD-10-CM | POA: Diagnosis not present

## 2021-07-31 DIAGNOSIS — Z794 Long term (current) use of insulin: Secondary | ICD-10-CM | POA: Diagnosis not present

## 2021-07-31 DIAGNOSIS — E1140 Type 2 diabetes mellitus with diabetic neuropathy, unspecified: Secondary | ICD-10-CM | POA: Diagnosis not present

## 2021-08-01 ENCOUNTER — Other Ambulatory Visit: Payer: Self-pay

## 2021-08-01 ENCOUNTER — Ambulatory Visit
Admission: RE | Admit: 2021-08-01 | Discharge: 2021-08-01 | Disposition: A | Discharge status: Home / Self Care | Payer: PPO | Source: Ambulatory Visit | Attending: Student in an Organized Health Care Education/Training Program | Admitting: Student in an Organized Health Care Education/Training Program

## 2021-08-01 ENCOUNTER — Ambulatory Visit (HOSPITAL_BASED_OUTPATIENT_CLINIC_OR_DEPARTMENT_OTHER): Payer: PPO | Admitting: Student in an Organized Health Care Education/Training Program

## 2021-08-01 ENCOUNTER — Encounter: Payer: Self-pay | Admitting: Student in an Organized Health Care Education/Training Program

## 2021-08-01 DIAGNOSIS — M75101 Unspecified rotator cuff tear or rupture of right shoulder, not specified as traumatic: Secondary | ICD-10-CM | POA: Diagnosis not present

## 2021-08-01 DIAGNOSIS — M12811 Other specific arthropathies, not elsewhere classified, right shoulder: Secondary | ICD-10-CM | POA: Diagnosis not present

## 2021-08-01 DIAGNOSIS — M25511 Pain in right shoulder: Secondary | ICD-10-CM

## 2021-08-01 DIAGNOSIS — G894 Chronic pain syndrome: Secondary | ICD-10-CM | POA: Diagnosis not present

## 2021-08-01 DIAGNOSIS — M19011 Primary osteoarthritis, right shoulder: Secondary | ICD-10-CM | POA: Diagnosis not present

## 2021-08-01 DIAGNOSIS — G8929 Other chronic pain: Secondary | ICD-10-CM | POA: Diagnosis not present

## 2021-08-01 MED ORDER — DEXAMETHASONE SODIUM PHOSPHATE 10 MG/ML IJ SOLN
INTRAMUSCULAR | Status: AC
  Filled 2021-08-01: qty 1

## 2021-08-01 MED ORDER — IOHEXOL 180 MG/ML  SOLN
10.0000 mL | Freq: Once | INTRAMUSCULAR | Status: AC
  Administered 2021-08-01: 5 mL via INTRA_ARTICULAR

## 2021-08-01 MED ORDER — ROPIVACAINE HCL 2 MG/ML IJ SOLN
INTRAMUSCULAR | Status: AC
  Filled 2021-08-01: qty 20

## 2021-08-01 MED ORDER — LIDOCAINE HCL 2 % IJ SOLN
20.0000 mL | Freq: Once | INTRAMUSCULAR | Status: AC
  Administered 2021-08-01: 200 mg

## 2021-08-01 MED ORDER — ROPIVACAINE HCL 2 MG/ML IJ SOLN
4.0000 mL | Freq: Once | INTRAMUSCULAR | Status: AC
  Administered 2021-08-01: 4 mL via INTRA_ARTICULAR

## 2021-08-01 MED ORDER — DEXAMETHASONE SODIUM PHOSPHATE 10 MG/ML IJ SOLN
10.0000 mg | Freq: Once | INTRAMUSCULAR | Status: AC
  Administered 2021-08-01: 10 mg

## 2021-08-01 MED ORDER — LIDOCAINE HCL (PF) 2 % IJ SOLN
INTRAMUSCULAR | Status: AC
  Filled 2021-08-01: qty 10

## 2021-08-01 NOTE — Patient Instructions (Signed)

## 2021-08-01 NOTE — Progress Notes (Signed)
PROVIDER NOTE: Information contained herein reflects review and annotations entered in association with encounter. Interpretation of such information and data should be left to medically-trained personnel. Information provided to patient can be located elsewhere in the medical record under "Patient Instructions". Document created using STT-dictation technology, any transcriptional errors that may result from process are unintentional.    Patient: James Clarke  Service Category: Procedure Provider: Gillis Santa, MD DOB: [birth year] DOS: 08/01/2021 Location: [facility] MRN: [id number] Setting: Ambulatory - outpatient Referring Provider: Gillis Santa, MD Type: Established Patient Specialty: Interventional Pain Management PCP: Sofie Hartigan, MD  Primary Reason for Visit: Interventional Pain Management Treatment. CC: Arm Pain (Right upper arm)     Procedure:          Anesthesia, Analgesia, Anxiolysis:  Type: Therapeutic Suprascapular nerve Block #2  Primary Purpose: Diagnostic Region: Posterior Shoulder & Scapular Areas Level: Superior to the scapular spine, in the lateral aspect of the supraspinatus fossa (Suprescapular notch). Target Area: Suprascapular nerve as it passes thru the lower portion of the suprascapular notch. Approach: Posterior percutaneous approach. Laterality: Right-Side  Anesthesia: Local (1-2% Lidocaine)  Anxiolysis: None  Sedation: None  Guidance: Fluoroscopy           Position: Prone   Indications: 1. Right rotator cuff tear arthropathy   2. Chronic right shoulder pain   3. Localized osteoarthritis of right shoulder   4. Chronic pain syndrome    Pain Score: Pre-procedure: 7 /10 Post-procedure: 0-No pain/10      Pre-op H&P Assessment:  James Clarke is a 81 y.o. (year old), male patient, seen today for interventional treatment. He  has a past surgical history that includes Cholecystectomy; Hernia repair; Appendectomy; Fracture surgery;  Tonsillectomy; Hand surgery; Cataract extraction w/PHACO (Left, 05/20/2017); Cataract extraction w/PHACO (Right, 06/10/2017); Cystoscopy/ureteroscopy/holmium laser/stent placement (Right, 08/13/2019); Cystoscopy w/ retrogrades (Right, 08/13/2019); Cystoscopy with stent placement (Right, 07/26/2019); and Shoulder arthroscopy with subacromial decompression and open rotator cuff repair, open bicep tendon repair (Right, 09/11/2020). James Clarke has a current medication list which includes the following prescription(s): acetaminophen, albuterol, amlodipine, calcitriol, furosemide, global inject ease insulin syr, onetouch ultra, insulin aspart, insulin detemir, jardiance, onetouch delica plus [id number], lovastatin, metoprolol succinate, potassium chloride sa, tramadol, valsartan, and amlodipine besylate. His primarily concern today is the Arm Pain (Right upper arm)   Initial Vital Signs:  Pulse/HCG Rate: 65ECG Heart Rate: 65 Temp: (!) 97 F (36.1 C) Resp: 18 BP: (!) 161/74 SpO2: 100 %  BMI: Estimated body mass index is 47.35 kg/m as calculated from the following:   Height as of this encounter: 5\' 10"  (1.778 m).   Weight as of this encounter: 330 lb (149.7 kg).  Risk Assessment: Allergies: Reviewed. He is allergic to codeine, doxycycline, and erythromycin.  Allergy Precautions: None required Coagulopathies: Reviewed. None identified.  Blood-thinner therapy: None at this time Active Infection(s): Reviewed. None identified. James Clarke is afebrile  Site Confirmation: James Clarke was asked to confirm the procedure and laterality before marking the site Procedure checklist: Completed Consent: Before the procedure and under the influence of no sedative(s), amnesic(s), or anxiolytics, the patient was informed of the treatment options, risks and possible complications. To fulfill our ethical and legal obligations, as recommended by the American Medical Association's Code of Ethics, I have informed the patient of my  clinical impression; the nature and purpose of the treatment or procedure; the risks, benefits, and possible complications of the intervention; the alternatives, including doing nothing; the risk(s) and benefit(s) of the alternative  treatment(s) or procedure(s); and the risk(s) and benefit(s) of doing nothing. The patient was provided information about the general risks and possible complications associated with the procedure. These may include, but are not limited to: failure to achieve desired goals, infection, bleeding, organ or nerve damage, allergic reactions, paralysis, and death. In addition, the patient was informed of those risks and complications associated to the procedure, such as failure to decrease pain; infection; bleeding; organ or nerve damage with subsequent damage to sensory, motor, and/or autonomic systems, resulting in permanent pain, numbness, and/or weakness of one or several areas of the body; allergic reactions; (i.e.: anaphylactic reaction); and/or death. Furthermore, the patient was informed of those risks and complications associated with the medications. These include, but are not limited to: allergic reactions (i.e.: anaphylactic or anaphylactoid reaction(s)); adrenal axis suppression; blood sugar elevation that in diabetics may result in ketoacidosis or comma; water retention that in patients with history of congestive heart failure may result in shortness of breath, pulmonary edema, and decompensation with resultant heart failure; weight gain; swelling or edema; medication-induced neural toxicity; particulate matter embolism and blood vessel occlusion with resultant organ, and/or nervous system infarction; and/or aseptic necrosis of one or more joints. Finally, the patient was informed that Medicine is not an exact science; therefore, there is also the possibility of unforeseen or unpredictable risks and/or possible complications that may result in a catastrophic outcome. The  patient indicated having understood very clearly. We have given the patient no guarantees and we have made no promises. Enough time was given to the patient to ask questions, all of which were answered to the patient's satisfaction. Mr. Mcmanaway has indicated that he wanted to continue with the procedure. Attestation: I, the ordering provider, attest that I have discussed with the patient the benefits, risks, side-effects, alternatives, likelihood of achieving goals, and potential problems during recovery for the procedure that I have provided informed consent. Date   Time: 08/01/2021  8:24 AM  Pre-Procedure Preparation:  Monitoring: As per clinic protocol. Respiration, ETCO2, SpO2, BP, heart rate and rhythm monitor placed and checked for adequate function Safety Precautions: Patient was assessed for positional comfort and pressure points before starting the procedure. Time-out: I initiated and conducted the "Time-out" before starting the procedure, as per protocol. The patient was asked to participate by confirming the accuracy of the "Time Out" information. Verification of the correct person, site, and procedure were performed and confirmed by me, the nursing staff, and the patient. "Time-out" conducted as per Joint Commission's Universal Protocol (UP.01.01.01). Time: 0910  Description of Procedure:          Area Prepped: Entire shoulder Area DuraPrep (Iodine Povacrylex [0.7% available iodine] and Isopropyl Alcohol, 74% w/w) Safety Precautions: Aspiration looking for blood return was conducted prior to all injections. At no point did we inject any substances, as a needle was being advanced. No attempts were made at seeking any paresthesias. Safe injection practices and needle disposal techniques used. Medications properly checked for expiration dates. SDV (single dose vial) medications used. Description of the Procedure: Protocol guidelines were followed. The patient was placed in position over the  procedure table. The target area was identified and the area prepped in the usual manner. Skin & deeper tissues infiltrated with local anesthetic. Appropriate amount of time allowed to pass for local anesthetics to take effect. The procedure needles were then advanced to the target area. Proper needle placement secured. Negative aspiration confirmed. Solution injected in intermittent fashion, asking for systemic symptoms every 0.5cc  of injectate. The needles were then removed and the area cleansed, making sure to leave some of the prepping solution back to take advantage of its long term bactericidal properties.  Vitals:   08/01/21 0840 08/01/21 0841 08/01/21 0908 08/01/21 0914  BP:  (!) 161/74 (!) 174/91 (!) 177/90  Pulse:  65    Resp:  18 (!) 24 (!) 22  Temp: (!) 97 F (36.1 C)     SpO2:  100% 100% 100%  Weight: (!) 330 lb (149.7 kg)     Height: 5\' 10"  (1.778 m)       Start Time: 0910 hrs. End Time: 0913 hrs. Materials:  Needle(s) Type: Spinal Needle Gauge: 22G Length: 3.5-in Medication(s): Please see orders for medications and dosing details. 6cc solution made of 5 cc of 0.2% ropivacaine, 1 cc of Decadron 10 mg/cc. Imaging Guidance (Non-Spinal):          Type of Imaging Technique: Fluoroscopy Guidance (Non-Spinal) Indication(s): Assistance in needle guidance and placement for procedures requiring needle placement in or near specific anatomical locations not easily accessible without such assistance. Exposure Time: Please see nurses notes. Contrast: Before injecting any contrast, we confirmed that the patient did not have an allergy to iodine, shellfish, or radiological contrast. Once satisfactory needle placement was completed at the desired level, radiological contrast was injected. Contrast injected under live fluoroscopy. No contrast complications. See chart for type and volume of contrast used. Fluoroscopic Guidance: I was personally present during the use of fluoroscopy. "Tunnel  Vision Technique" used to obtain the best possible view of the target area. Parallax error corrected before commencing the procedure. "Direction-depth-direction" technique used to introduce the needle under continuous pulsed fluoroscopy. Once target was reached, antero-posterior, oblique, and lateral fluoroscopic projection used confirm needle placement in all planes. Images permanently stored in EMR. Interpretation: I personally interpreted the imaging intraoperatively. Adequate needle placement confirmed in multiple planes. Appropriate spread of contrast into desired area was observed. No evidence of afferent or efferent intravascular uptake. Permanent images saved into the patient's record.   Post-operative Assessment:  Post-procedure Vital Signs:  Pulse/HCG Rate: 6565 Temp: (!) 97 F (36.1 C) Resp: (!) 22 BP: (!) 177/90 SpO2: 100 %  EBL: None  Complications: No immediate post-treatment complications observed by team, or reported by patient.  Note: The patient tolerated the entire procedure well. A repeat set of vitals were taken after the procedure and the patient was kept under observation following institutional policy, for this type of procedure. Post-procedural neurological assessment was performed, showing return to baseline, prior to discharge. The patient was provided with post-procedure discharge instructions, including a section on how to identify potential problems. Should any problems arise concerning this procedure, the patient was given instructions to immediately contact us, at any time, without hesitation. In any case, we plan to contact the patient by telephone for a follow-up status report regarding this interventional procedure.  Comments:  No additional relevant information.  Plan of Care  Orders:  Orders Placed This Encounter  Procedures   DG PAIN CLINIC C-ARM 1-60 MIN NO REPORT    Intraoperative interpretation by procedural physician at [place].     Standing Status:   Standing    Number of Occurrences:   1    Order Specific Question:   Reason for exam:    Answer:   Assistance in needle guidance and placement for procedures requiring needle placement in or near specific anatomical locations not easily accessible without such assistance.  Medications ordered for procedure: Meds ordered this encounter  Medications   iohexol (OMNIPAQUE) 180 MG/ML injection 10 mL    Must be Myelogram-compatible. If not available, you may substitute with a water-soluble, non-ionic, hypoallergenic, myelogram-compatible radiological contrast medium.   lidocaine (XYLOCAINE) 2 % (with pres) injection 400 mg   ropivacaine (PF) 2 mg/mL (0.2%) (NAROPIN) injection 4 mL   dexamethasone (DECADRON) injection 10 mg    Medications administered: We administered iohexol, lidocaine, ropivacaine (PF) 2 mg/mL (0.2%), and dexamethasone.  See the medical record for exact dosing, route, and time of administration.  Follow-up plan:   No follow-ups on file.     Right SSNB #1 06/27/21, #2 08/01/21  Recent Visits Date Type Provider Dept  07/25/21 Office Visit Gillis Santa, MD [facility]  06/27/21 Procedure visit Gillis Santa, MD [facility]  06/25/21 Office Visit Gillis Santa, MD [facility]  Showing recent visits within past 90 days and meeting all other requirements Today's Visits Date Type Provider Dept  08/01/21 Procedure visit Gillis Santa, MD [facility]  Showing today's visits and meeting all other requirements Future Appointments Date Type Provider Dept  08/29/21 Appointment Gillis Santa, MD [facility]  Showing future appointments within next 90 days and meeting all other requirements  Disposition: Discharge home  Discharge (Date   Time): 08/01/2021; 0919 hrs.   Primary Care Physician: Sofie Hartigan, MD Location: [facility] Note by: Gillis Santa, MD Date: 08/01/2021;  Time: 9:37 AM  Disclaimer:  Medicine is not an exact science. The only guarantee in medicine is that nothing is guaranteed. It is important to note that the decision to proceed with this intervention was based on the information collected from the patient. The Data and conclusions were drawn from the patient's questionnaire, the interview, and the physical examination. Because the information was provided in large part by the patient, it cannot be guaranteed that it has not been purposely or unconsciously manipulated. Every effort has been made to obtain as much relevant data as possible for this evaluation. It is important to note that the conclusions that lead to this procedure are derived in large part from the available data. Always take into account that the treatment will also be dependent on availability of resources and existing treatment guidelines, considered by other Pain Management Practitioners as being common knowledge and practice, at the time of the intervention. For Medico-Legal purposes, it is also important to point out that variation in procedural techniques and pharmacological choices are the acceptable norm. The indications, contraindications, technique, and results of the above procedure should only be interpreted and judged by a Board-Certified Interventional Pain Specialist with extensive familiarity and expertise in the same exact procedure and technique.

## 2021-08-01 NOTE — Progress Notes (Signed)
Safety precautions to be maintained throughout the outpatient stay will include: orient to surroundings, keep bed in low position, maintain call bell within reach at all times, provide assistance with transfer out of bed and ambulation.  

## 2021-08-02 ENCOUNTER — Telehealth: Payer: Self-pay | Admitting: *Deleted

## 2021-08-02 NOTE — Telephone Encounter (Signed)
Denies any post procedure issues. 

## 2021-08-15 DIAGNOSIS — E1169 Type 2 diabetes mellitus with other specified complication: Secondary | ICD-10-CM | POA: Diagnosis not present

## 2021-08-15 DIAGNOSIS — I10 Essential (primary) hypertension: Secondary | ICD-10-CM | POA: Diagnosis not present

## 2021-08-15 DIAGNOSIS — E1142 Type 2 diabetes mellitus with diabetic polyneuropathy: Secondary | ICD-10-CM | POA: Diagnosis not present

## 2021-08-15 DIAGNOSIS — Z794 Long term (current) use of insulin: Secondary | ICD-10-CM | POA: Diagnosis not present

## 2021-08-15 DIAGNOSIS — E1121 Type 2 diabetes mellitus with diabetic nephropathy: Secondary | ICD-10-CM | POA: Diagnosis not present

## 2021-08-15 DIAGNOSIS — E1122 Type 2 diabetes mellitus with diabetic chronic kidney disease: Secondary | ICD-10-CM | POA: Diagnosis not present

## 2021-08-15 DIAGNOSIS — E669 Obesity, unspecified: Secondary | ICD-10-CM | POA: Diagnosis not present

## 2021-08-15 DIAGNOSIS — N184 Chronic kidney disease, stage 4 (severe): Secondary | ICD-10-CM | POA: Diagnosis not present

## 2021-08-20 DIAGNOSIS — Z794 Long term (current) use of insulin: Secondary | ICD-10-CM | POA: Diagnosis not present

## 2021-08-20 DIAGNOSIS — B351 Tinea unguium: Secondary | ICD-10-CM | POA: Diagnosis not present

## 2021-08-20 DIAGNOSIS — E1140 Type 2 diabetes mellitus with diabetic neuropathy, unspecified: Secondary | ICD-10-CM | POA: Diagnosis not present

## 2021-08-29 ENCOUNTER — Telehealth: Payer: Self-pay

## 2021-08-29 ENCOUNTER — Ambulatory Visit
Payer: PPO | Attending: Student in an Organized Health Care Education/Training Program | Admitting: Student in an Organized Health Care Education/Training Program

## 2021-08-29 ENCOUNTER — Encounter: Payer: Self-pay | Admitting: Student in an Organized Health Care Education/Training Program

## 2021-08-29 ENCOUNTER — Other Ambulatory Visit: Payer: Self-pay

## 2021-08-29 DIAGNOSIS — M19011 Primary osteoarthritis, right shoulder: Secondary | ICD-10-CM

## 2021-08-29 DIAGNOSIS — M12811 Other specific arthropathies, not elsewhere classified, right shoulder: Secondary | ICD-10-CM

## 2021-08-29 DIAGNOSIS — N183 Chronic kidney disease, stage 3 unspecified: Secondary | ICD-10-CM

## 2021-08-29 DIAGNOSIS — G894 Chronic pain syndrome: Secondary | ICD-10-CM | POA: Diagnosis not present

## 2021-08-29 DIAGNOSIS — G8929 Other chronic pain: Secondary | ICD-10-CM

## 2021-08-29 DIAGNOSIS — M25511 Pain in right shoulder: Secondary | ICD-10-CM | POA: Diagnosis not present

## 2021-08-29 DIAGNOSIS — M75101 Unspecified rotator cuff tear or rupture of right shoulder, not specified as traumatic: Secondary | ICD-10-CM

## 2021-08-29 NOTE — Telephone Encounter (Signed)
Attempted to call patient. No answer. Unable to leave message r/t no mailbox.

## 2021-08-29 NOTE — Telephone Encounter (Signed)
I have him scheduled for RFA on 2/22 @ 10 with po valium. Please call and give his instructions for eating etc.

## 2021-08-29 NOTE — Progress Notes (Signed)
Patient: James Clarke  Service Category: E/M  Provider: Gillis Santa, MD  DOB: Aug 11, 1940  DOS: 08/29/2021  Location: Office  MRN: [id number]  Setting: Ambulatory outpatient  Referring Provider: Sofie Hartigan, MD  Type: Established Patient  Specialty: Interventional Pain Management  PCP: Sofie Hartigan, MD  Location: Remote location  Delivery: TeleHealth     Virtual Encounter - Pain Management PROVIDER NOTE: Information contained herein reflects review and annotations entered in association with encounter. Interpretation of such information and data should be left to medically-trained personnel. Information provided to patient can be located elsewhere in the medical record under "Patient Instructions". Document created using STT-dictation technology, any transcriptional errors that may result from process are unintentional.    Contact & Pharmacy Preferred: [phone] Home: [phone] (home) Mobile: [phone] (mobile) E-mail: [email or web address]  [place], [place] - [address] [address] Phone: [phone] Fax: [place], [place] [address] [place] [address] Phone: [phone] Fax: [place], [place] [address] [facility] [place] [address] Phone: [phone] Fax: [phone]   Pre-screening  James Clarke offered "in-person" vs "virtual" encounter. He indicated preferring virtual for this encounter.   Reason COVID-19*   Social distancing based on CDC and AMA recommendations.   I contacted James Clarke on 08/29/2021 via telephone.      I clearly identified myself as Gillis Santa, MD. I verified that I was speaking with the correct person using two identifiers (Name: James Clarke, and date of birth: [birth year]).  Consent I sought verbal advanced consent from [place]  for virtual visit interactions. I informed James Clarke of possible security and privacy concerns, risks, and limitations associated with providing "not-in-person" medical evaluation and management services. I also informed James Clarke of the availability of "in-person" appointments. Finally, I informed him that there would be a charge for the virtual visit and that he could be  personally, fully or partially, financially responsible for it. James Clarke expressed understanding and agreed to proceed.   Historic Elements   James Clarke is a 81 y.o. year old, male patient evaluated today after our last contact on 08/01/2021. James Clarke  has a past medical history of Asthma, Bell's palsy, Bell's palsy, Cancer ([place]), Diabetes mellitus without complication ([place]), Dyspnea, History of kidney stones, Hyperlipemia, Hypertension, Kidney stone, Neuropathy, Obesity, and Sleep apnea. He also  has a past surgical history that includes Cholecystectomy; Hernia repair; Appendectomy; Fracture surgery; Tonsillectomy; Hand surgery; Cataract extraction w/PHACO (Left, 05/20/2017); Cataract extraction w/PHACO (Right, 06/10/2017); Cystoscopy/ureteroscopy/holmium laser/stent placement (Right, 08/13/2019); Cystoscopy w/ retrogrades (Right, 08/13/2019); Cystoscopy with stent placement (Right, 07/26/2019); and Shoulder arthroscopy with subacromial decompression and open rotator cuff repair, open bicep tendon repair (Right, 09/11/2020). James Clarke has a current medication list which includes the following prescription(s): acetaminophen, albuterol, amlodipine, calcitriol, furosemide, global inject ease insulin syr, onetouch ultra, insulin aspart, insulin detemir, jardiance, onetouch delica plus [id number], lovastatin, metoprolol succinate, potassium chloride sa, valsartan, and amlodipine besylate. He  reports that he has never smoked. He has never used smokeless tobacco. He reports that he does not currently use drugs. He reports that he does not drink  alcohol. James Clarke is allergic to codeine, doxycycline, and erythromycin.   HPI  Today, he is being contacted for a post-procedure assessment.  Post-procedure evaluation     Procedure:          Anesthesia, Analgesia, Anxiolysis:  Type: Therapeutic Suprascapular nerve Block #2  Primary Purpose: Diagnostic Region: Posterior Shoulder & Scapular Areas Level: Superior to the scapular spine, in the lateral aspect of the supraspinatus fossa (Suprescapular notch). Target Area: Suprascapular nerve as it passes thru the lower portion of the suprascapular notch. Approach: Posterior percutaneous approach. Laterality: Right-Side  Anesthesia: Local (1-2% Lidocaine)  Anxiolysis: None  Sedation: None  Guidance: Fluoroscopy           Position: Prone   Indications: 1. Right rotator cuff tear arthropathy   2. Chronic right shoulder pain   3. Localized osteoarthritis of right shoulder   4. Chronic pain syndrome    Pain Score: Pre-procedure: 7 /10 Post-procedure: 0-No pain/10      Effectiveness:  Initial hour after procedure: 100 %  Subsequent 4-6 hours post-procedure: 100 % (2 days)  Analgesia past initial 6 hours: 50 % (3-4 days)  Ongoing improvement:  Analgesic:  back to baseline Function: James Clarke reports improvement in function      Laboratory Chemistry Profile   Renal Lab Results  Component Value Date   BUN 79 (H) 10/17/2020   CREATININE 2.35 (H) 10/17/2020   GFRAA 30 (L) 03/13/2020   GFRNONAA 27 (L) 10/17/2020    Hepatic Lab Results  Component Value Date   AST 18 03/13/2020   ALT 14 03/13/2020   ALBUMIN 3.4 (L) 10/17/2020   ALKPHOS 65 03/13/2020   LIPASE 28 08/12/2015    Electrolytes Lab Results  Component Value Date   NA 139 10/17/2020   K 3.6 10/17/2020   CL 100 10/17/2020   CALCIUM 9.6 10/17/2020   PHOS 3.5 10/17/2020    Bone No results found for: VD25OH, VD125OH2TOT, JK0938HW2, XH3716RC7, 25OHVITD1, 25OHVITD2, 25OHVITD3, TESTOFREE, TESTOSTERONE   Inflammation (CRP: Acute Phase) (ESR: Chronic Phase) No results found for: CRP, ESRSEDRATE, LATICACIDVEN       Note: Above Lab results reviewed.   Assessment  The primary encounter diagnosis was Right rotator cuff tear arthropathy. Diagnoses of Chronic right shoulder pain, Localized osteoarthritis of right shoulder, Stage 3 chronic kidney disease, unspecified whether stage 3a or 3b CKD ([place]), and Chronic pain syndrome were also pertinent to this visit.  Plan of Care   Patient is status post 2 positive diagnostic right suprascapular nerve blocks.  We discussed neck steps which could include monitoring his symptoms and repeating nerve blocks in the future or proceeding with a radiofrequency ablation of the right suprascapular nerve for the purpose of obtaining longer-term pain relief.  Risks and benefits were explained in great detail.  I also level set expectations in regards to analgesic benefit that can be expected from suprascapular nerve ablation which can be variable and even short-term at times.  Patient endorsed understanding.  Future consideration could include right suprascapular nerve peripheral nerve stim.   Orders:  Orders Placed This Encounter  Procedures   Radiofrequency Shoulder Joint    Standing Status:   Future    Standing Expiration Date:   02/26/2022    Scheduling Instructions:     RIGHT SSN RFA    Order Specific Question:   Where will this procedure be performed?    Answer:   [facility]   Follow-up plan:   Return in about 2 weeks (around 09/12/2021) for R Suprascap Nerve RFA , in clinic (PO Valium).     Right SSNB #1 06/27/21, #2 08/01/21  Recent Visits Date Type Provider Dept  08/01/21 Procedure visit Gillis Santa, MD [facility]  07/25/21 Office Visit Gillis Santa, MD [facility]  06/27/21 Procedure visit Gillis Santa, MD [facility]  06/25/21 Office Visit Gillis Santa, MD [facility]  Showing recent visits  within past 90 days and meeting all other requirements Today's Visits Date Type Provider Dept  08/29/21 Office Visit Gillis Santa, MD [facility]  Showing today's visits and meeting all other requirements Future Appointments No visits were found meeting these conditions. Showing future appointments within next 90 days and meeting all other requirements  I discussed the assessment and treatment plan with the patient. The patient was provided an opportunity to ask questions and all were answered. The patient agreed with the plan and demonstrated an understanding of the instructions.  Patient advised to call back or seek an in-person evaluation if the symptoms or condition worsens.  Duration of encounter: 21mnutes.  Note by: BGillis Santa MD Date: 08/29/2021; Time: 11:50 AM

## 2021-09-12 ENCOUNTER — Other Ambulatory Visit: Payer: Self-pay

## 2021-09-12 ENCOUNTER — Encounter: Payer: Self-pay | Admitting: Student in an Organized Health Care Education/Training Program

## 2021-09-12 ENCOUNTER — Ambulatory Visit (HOSPITAL_BASED_OUTPATIENT_CLINIC_OR_DEPARTMENT_OTHER): Payer: PPO | Admitting: Student in an Organized Health Care Education/Training Program

## 2021-09-12 ENCOUNTER — Ambulatory Visit
Admission: RE | Admit: 2021-09-12 | Discharge: 2021-09-12 | Disposition: A | Discharge status: Home / Self Care | Payer: PPO | Source: Ambulatory Visit | Attending: Student in an Organized Health Care Education/Training Program | Admitting: Student in an Organized Health Care Education/Training Program

## 2021-09-12 DIAGNOSIS — M12811 Other specific arthropathies, not elsewhere classified, right shoulder: Secondary | ICD-10-CM | POA: Insufficient documentation

## 2021-09-12 DIAGNOSIS — M25511 Pain in right shoulder: Secondary | ICD-10-CM | POA: Diagnosis not present

## 2021-09-12 DIAGNOSIS — M75101 Unspecified rotator cuff tear or rupture of right shoulder, not specified as traumatic: Secondary | ICD-10-CM | POA: Diagnosis not present

## 2021-09-12 DIAGNOSIS — G894 Chronic pain syndrome: Secondary | ICD-10-CM | POA: Insufficient documentation

## 2021-09-12 DIAGNOSIS — G8929 Other chronic pain: Secondary | ICD-10-CM | POA: Diagnosis not present

## 2021-09-12 DIAGNOSIS — M19011 Primary osteoarthritis, right shoulder: Secondary | ICD-10-CM

## 2021-09-12 MED ORDER — DIAZEPAM 5 MG PO TABS
ORAL_TABLET | ORAL | Status: AC
  Filled 2021-09-12: qty 1

## 2021-09-12 MED ORDER — LIDOCAINE HCL 2 % IJ SOLN
20.0000 mL | Freq: Once | INTRAMUSCULAR | Status: DC
  Filled 2021-09-12: qty 20

## 2021-09-12 MED ORDER — ROPIVACAINE HCL 2 MG/ML IJ SOLN
INTRAMUSCULAR | Status: AC
  Filled 2021-09-12: qty 20

## 2021-09-12 MED ORDER — ROPIVACAINE HCL 2 MG/ML IJ SOLN: 2.0000 mL | Freq: Once | INTRAMUSCULAR | Status: DC

## 2021-09-12 MED ORDER — IOHEXOL 180 MG/ML  SOLN
10.0000 mL | Freq: Once | INTRAMUSCULAR | Status: DC
Start: 2021-09-12 — End: 2021-09-12

## 2021-09-12 MED ORDER — DEXAMETHASONE SODIUM PHOSPHATE 10 MG/ML IJ SOLN
10.0000 mg | Freq: Once | INTRAMUSCULAR | Status: DC
  Filled 2021-09-12: qty 1

## 2021-09-12 NOTE — Progress Notes (Signed)
PROVIDER NOTE: Interpretation of information contained herein should be left to medically-trained personnel. Specific patient instructions are provided elsewhere under "Patient Instructions" section of medical record. This document was created in part using STT-dictation technology, any transcriptional errors that may result from this process are unintentional.  Patient: James Clarke Type: Established DOB: 02/21/1941 MRN: [id number] PCP: Sofie Hartigan, MD  Service: Procedure DOS: 09/12/2021 Setting: Ambulatory Location: Ambulatory outpatient facility Delivery: Face-to-face Provider: Gillis Santa, MD Specialty: Interventional Pain Management Specialty designation: 09 Location: Outpatient facility Ref. Prov.: Gillis Santa, MD    Primary Reason for Visit: Interventional Pain Management Treatment. CC: Other and Shoulder Pain   Procedure:           Type: Suprascapular nerve pulsed Radiofrequency Ablation (RFA) #1  Laterality: Right  Level: Superior to the scapular spine, in the lateral aspect of the supraspinatus fossa (Suprescapular notch).  Imaging: Fluoroscopic guidance Anesthesia: Local anesthesia (1-2% Lidocaine) Anxiolysis: Oral (Valium 5 mg) Sedation: minimal anxiolysis.  DOS: 09/12/2021  Performed by: Gillis Santa, MD  Purpose: Therapeutic Indications: Shoulder pain severe enough to impact quality of life or function. Indications: 1. Right rotator cuff tear arthropathy   2. Chronic right shoulder pain   3. Localized osteoarthritis of right shoulder   4. Chronic pain syndrome    [name] has been dealing with the above chronic pain for longer than three months and has either failed to respond, was unable to tolerate, or simply did not get enough benefit from other more conservative therapies including, but not limited to: 1. Over-the-counter medications 2. Anti-inflammatory medications 3. Muscle relaxants 4. Membrane stabilizers 5. Opioids 6. Physical therapy  and/or chiropractic manipulation 7. Modalities (Heat, ice, etc.) 8. Invasive techniques such as nerve blocks. Mr. Poss has attained more than 50% relief of the pain from a series of diagnostic injections conducted in separate occasions.  Pain Score: Pre-procedure: 4 /10 Post-procedure: 0/10, significantly improved right shoulder range of motion after RFA    Position / Prep / Materials:  Position: Prone  Prep solution: DuraPrep (Iodine Povacrylex [0.7% available iodine] and Isopropyl Alcohol, 74% w/w) Prep Area: Entire shoulder area.  This includes the lateral and posterior aspect of the neck, lateral and posterior aspect of the upper third of the upper arm, from the axilla to the spine in the upper back, down to the lower border of the scapula. Materials:  Tray:  RFA (Radiofrequency) tray Needle(s):  Type: RFA (Teflon-coated radiofrequency ablation needles)  Gauge (G): 22  Length: Regular (10cm) Qty: 1  Pre-op H&P Assessment:  James Clarke is a 81 y.o. (year old), male patient, seen today for interventional treatment. He  has a past surgical history that includes Cholecystectomy; Hernia repair; Appendectomy; Fracture surgery; Tonsillectomy; Hand surgery; Cataract extraction w/PHACO (Left, 05/20/2017); Cataract extraction w/PHACO (Right, 06/10/2017); Cystoscopy/ureteroscopy/holmium laser/stent placement (Right, 08/13/2019); Cystoscopy w/ retrogrades (Right, 08/13/2019); Cystoscopy with stent placement (Right, 07/26/2019); and Shoulder arthroscopy with subacromial decompression and open rotator cuff repair, open bicep tendon repair (Right, 09/11/2020). James Clarke has a current medication list which includes the following prescription(s): albuterol, amlodipine, calcitriol, furosemide, global inject ease insulin syr, onetouch ultra, insulin aspart, insulin detemir, jardiance, onetouch delica plus ENIDPO24M, lovastatin, metoprolol succinate, potassium chloride sa, valsartan, and amlodipine besylate, and the  following Facility-Administered Medications: dexamethasone, lidocaine, and ropivacaine (pf) 2 mg/ml (0.2%). His primarily concern today is the Other and Shoulder Pain  Initial Vital Signs:  Pulse/HCG Rate:  (!) 59   Temp: (!) 97.2 F (36.2 C) Resp: 17 BP: Marland Kitchen)  151/75 SpO2: 96 %  BMI: Estimated body mass index is 47.35 kg/m as calculated from the following:   Height as of this encounter: 5\' 10"  (1.778 m).   Weight as of this encounter: 330 lb (149.7 kg).  Risk Assessment: Allergies: Reviewed. He is allergic to codeine, doxycycline, and erythromycin.  Allergy Precautions: None required Coagulopathies: Reviewed. None identified.  Blood-thinner therapy: None at this time Active Infection(s): Reviewed. None identified. James Clarke is afebrile  Site Confirmation: James Clarke was asked to confirm the procedure and laterality before marking the site Procedure checklist: Completed Consent: Before the procedure and under the influence of no sedative(s), amnesic(s), or anxiolytics, the patient was informed of the treatment options, risks and possible complications. To fulfill our ethical and legal obligations, as recommended by the American Medical Association's Code of Ethics, I have informed the patient of my clinical impression; the nature and purpose of the treatment or procedure; the risks, benefits, and possible complications of the intervention; the alternatives, including doing nothing; the risk(s) and benefit(s) of the alternative treatment(s) or procedure(s); and the risk(s) and benefit(s) of doing nothing. The patient was provided information about the general risks and possible complications associated with the procedure. These may include, but are not limited to: failure to achieve desired goals, infection, bleeding, organ or nerve damage, allergic reactions, paralysis, and death. In addition, the patient was informed of those risks and complications associated to the procedure, such as  failure to decrease pain; infection; bleeding; organ or nerve damage with subsequent damage to sensory, motor, and/or autonomic systems, resulting in permanent pain, numbness, and/or weakness of one or several areas of the body; allergic reactions; (i.e.: anaphylactic reaction); and/or death. Furthermore, the patient was informed of those risks and complications associated with the medications. These include, but are not limited to: allergic reactions (i.e.: anaphylactic or anaphylactoid reaction(s)); adrenal axis suppression; blood sugar elevation that in diabetics may result in ketoacidosis or comma; water retention that in patients with history of congestive heart failure may result in shortness of breath, pulmonary edema, and decompensation with resultant heart failure; weight gain; swelling or edema; medication-induced neural toxicity; particulate matter embolism and blood vessel occlusion with resultant organ, and/or nervous system infarction; and/or aseptic necrosis of one or more joints. Finally, the patient was informed that Medicine is not an exact science; therefore, there is also the possibility of unforeseen or unpredictable risks and/or possible complications that may result in a catastrophic outcome. The patient indicated having understood very clearly. We have given the patient no guarantees and we have made no promises. Enough time was given to the patient to ask questions, all of which were answered to the patient's satisfaction. Mr. Abernathy has indicated that he wanted to continue with the procedure. Attestation: I, the ordering provider, attest that I have discussed with the patient the benefits, risks, side-effects, alternatives, likelihood of achieving goals, and potential problems during recovery for the procedure that I have provided informed consent. Date   Time: 09/12/2021  9:35 AM  Pre-Procedure Preparation:  Monitoring: As per clinic protocol. Respiration, ETCO2, SpO2, BP, heart rate  and rhythm monitor placed and checked for adequate function Safety Precautions: Patient was assessed for positional comfort and pressure points before starting the procedure. Time-out: I initiated and conducted the "Time-out" before starting the procedure, as per protocol. The patient was asked to participate by confirming the accuracy of the "Time Out" information. Verification of the correct person, site, and procedure were performed and confirmed by me, the  nursing staff, and the patient. "Time-out" conducted as per Joint Commission's Universal Protocol (UP.01.01.01). Time: 1036  Description/Narrative of Procedure:          Target: Suprascapular nerve as it passes thru the lower portion of the suprascapular notch. Location: Suprascapular notch Region: Shoulder, suprascapular Approach: Percutaneous   Rationale (medical necessity): procedure needed and proper for the diagnosis and/or treatment of the patient's medical symptoms and needs. Procedural Technique Safety Precautions: Aspiration looking for blood return was conducted prior to all injections. At no point did we inject any substances, as a needle was being advanced. No attempts were made at seeking any paresthesias. Safe injection practices and needle disposal techniques used. Medications properly checked for expiration dates. SDV (single dose vial) medications used. Description of the Procedure: Protocol guidelines were followed. The patient was placed in position over the procedure table. The target area was identified and the area prepped in the usual manner. The skin and muscle were infiltrated with local anesthetic. Appropriate amount of time allowed to pass for local anesthetics to take effect. Radiofrequency needles were introduced to the target area using fluoroscopic guidance. Using the  [employer]  Radiofrequency Generator, sensory stimulation using 50 Hz was used to locate & identify the nerve, making sure that the needle was  positioned such that there was no sensory stimulation below 0.3 V or above 0.7 V. Stimulation using 2 Hz was used to evaluate the motor component. Care was taken not to lesion any nerves that demonstrated motor stimulation of the lower extremities at an output of less than 2.5 times that of the sensory threshold, or a maximum of 2.0 V. Once satisfactory placement of the needles was achieved, the numbing solution was slowly injected after negative aspiration.  Numbing solution: 3 cc solution made of 2 cc of 0.2% ropivacaine 1 cc of Decadron 10 mg/cc.  After waiting for at least 3 minutes, the ablation was performed at 42 degrees C for 120 seconds, using Pulsed Radiofrequency settings. Once the procedure was completed, the needles were then removed and the area cleansed, making sure to leave some of the prepping solution back to take advantage of its long term bactericidal properties. Intra-operative Compliance: Compliant   Vitals:   09/12/21 1037 09/12/21 1042 09/12/21 1047 09/12/21 1052  BP: (!) 197/87 (!) 194/86 (!) 199/87 (!) 195/86  Pulse: 63 63 65 62  Resp: 20 (!) 24 (!) 23 (!) 22  Temp:      SpO2: 97% 97% 97% 97%  Weight:      Height:        Start Time: 1036 hrs. End Time: 1048 hrs.  Materials & Medications:  Needle(s) Type: Teflon-coated, curved tip, Radiofrequency needle(s) Gauge: 22G Length: 10cm Medication(s): Please see orders for medications and dosing details.  Imaging Guidance (Non-Spinal):          Type of Imaging Technique: Fluoroscopy Guidance (Non-Spinal) Indication(s): Assistance in needle guidance and placement for procedures requiring needle placement in or near specific anatomical locations not easily accessible without such assistance. Exposure Time: Please see nurses notes. Contrast: Before injecting any contrast, we confirmed that the patient did not have an allergy to iodine, shellfish, or radiological contrast. Once satisfactory needle placement was completed at  the desired level, radiological contrast was injected. Contrast injected under live fluoroscopy. No contrast complications. See chart for type and volume of contrast used. Fluoroscopic Guidance: I was personally present during the use of fluoroscopy. "Tunnel Vision Technique" used to obtain the best possible view of the  target area. Parallax error corrected before commencing the procedure. "Direction-depth-direction" technique used to introduce the needle under continuous pulsed fluoroscopy. Once target was reached, antero-posterior, oblique, and lateral fluoroscopic projection used confirm needle placement in all planes. Images permanently stored in EMR. Interpretation: I personally interpreted the imaging intraoperatively. Adequate needle placement confirmed in multiple planes. Appropriate spread of contrast into desired area was observed. No evidence of afferent or efferent intravascular uptake. Permanent images saved into the patient's record.  Post-operative Assessment:  Post-procedure Vital Signs:  Pulse/HCG Rate:  62  Temp:  (!) 97.2 F (36.2 C) Resp: (!) 22 BP:  (!) 195/86 SpO2: 97 %  EBL: None  Complications: No immediate post-treatment complications observed by team, or reported by patient.  Note: The patient tolerated the entire procedure well. A repeat set of vitals were taken after the procedure and the patient was kept under observation following institutional policy, for this type of procedure. Post-procedural neurological assessment was performed, showing return to baseline, prior to discharge. The patient was provided with post-procedure discharge instructions, including a section on how to identify potential problems. Should any problems arise concerning this procedure, the patient was given instructions to immediately contact us, at any time, without hesitation. In any case, we plan to contact the patient by telephone for a follow-up status report regarding this interventional  procedure.  Comments:  No additional relevant information.  Plan of Care  Orders:  Orders Placed This Encounter  Procedures   DG PAIN CLIN[facility] C-ARM 1-60 MIN NO REPORT    Intraoperative interpretation by procedural physician at [place].    Standing Status:   Standing    Number of Occurrences:   1    Order Specific Question:   Reason for exam:    Answer:   Assistance in needle guidance and placement for procedures requiring needle placement in or near specific anatomical locations not easily accessible without such assistance.    Medications ordered for procedure: Meds ordered this encounter  Medications   lidocaine (XYLOCAINE) 2 % (with pres) injection 400 mg   dexamethasone (DECADRON) injection 10 mg   ropivacaine (PF) 2 mg/mL (0.2%) (NAROPIN) injection 2 mL   DISCONTD: iohexol (OMNIPAQUE) 180 MG/ML injection 10 mL    Must be Myelogram-compatible. If not available, you may substitute with a water-soluble, non-ionic, hypoallergenic, myelogram-compatible radiological contrast medium.   Medications administered: Peretz Thieme. [place] had no medications administered during this visit.  See the medical record for exact dosing, route, and time of administration.  Follow-up plan:   Return in about 4 weeks (around 10/10/2021) for Post Procedure Evaluation, virtual.       Right SSNB #1 06/27/21, #2 08/01/21, RFA 09/12/21    Recent Visits Date Type Provider Dept  08/29/21 Office Visit Gillis Santa, MD [facility]  08/01/21 Procedure visit Gillis Santa, MD [facility]  07/25/21 Office Visit Gillis Santa, MD [facility]  06/27/21 Procedure visit Gillis Santa, MD [facility]  06/25/21 Office Visit Gillis Santa, MD [facility]  Showing recent visits within past 90 days and meeting all other requirements Today's Visits Date Type Provider Dept  09/12/21 Procedure visit Gillis Santa, MD [facility]  Showing today's visits  and meeting all other requirements Future Appointments Date Type Provider Dept  10/10/21 Appointment Gillis Santa, MD [facility]  Showing future appointments within next 90 days and meeting all other requirements  Disposition: Discharge home  Discharge (Date   Time): 09/12/2021; 1053 hrs.  Primary Care Physician: Sofie Hartigan, MD Location: [facility] Note by: Gillis Santa, MD Date: 09/12/2021; Time: 11:20 AM  Disclaimer:  Medicine is not an exact science. The only guarantee in medicine is that nothing is guaranteed. It is important to note that the decision to proceed with this intervention was based on the information collected from the patient. The Data and conclusions were drawn from the patient's questionnaire, the interview, and the physical examination. Because the information was provided in large part by the patient, it cannot be guaranteed that it has not been purposely or unconsciously manipulated. Every effort has been made to obtain as much relevant data as possible for this evaluation. It is important to note that the conclusions that lead to this procedure are derived in large part from the available data. Always take into account that the treatment will also be dependent on availability of resources and existing treatment guidelines, considered by other Pain Management Practitioners as being common knowledge and practice, at the time of the intervention. For Medico-Legal purposes, it is also important to point out that variation in procedural techniques and pharmacological choices are the acceptable norm. The indications, contraindications, technique, and results of the above procedure should only be interpreted and judged by a Board-Certified Interventional Pain Specialist with extensive familiarity and expertise in the same exact procedure and technique.

## 2021-09-12 NOTE — Patient Instructions (Signed)

## 2021-09-13 ENCOUNTER — Telehealth: Payer: Self-pay

## 2021-09-13 NOTE — Telephone Encounter (Signed)
Called PP. Denies any needs at this time. He states his BS was 400 last night. Talked with him about the steroid. Instructed to call if needed.

## 2021-09-20 ENCOUNTER — Other Ambulatory Visit: Payer: Self-pay

## 2021-09-20 ENCOUNTER — Ambulatory Visit
Admission: RE | Admit: 2021-09-20 | Discharge: 2021-09-20 | Disposition: A | Discharge status: Home / Self Care | Payer: PPO | Source: Ambulatory Visit

## 2021-09-20 VITALS — BP 158/70 | HR 73 | Temp 98.1°F | Resp 22 | Wt 330.0 lb

## 2021-09-20 DIAGNOSIS — R300 Dysuria: Secondary | ICD-10-CM | POA: Diagnosis not present

## 2021-09-20 DIAGNOSIS — B356 Tinea cruris: Secondary | ICD-10-CM

## 2021-09-20 MED ORDER — CLOTRIMAZOLE 1 % EX OINT
1.0000 | TOPICAL_OINTMENT | Freq: Two times a day (BID) | CUTANEOUS | 1 refills | Status: DC
Start: 2021-09-20 — End: 2021-10-30

## 2021-09-20 NOTE — ED Triage Notes (Signed)
Patient is here with spouse for "red rash in groin area, with white in places". OTC not helping. Noticed it first about a week ago.  ?

## 2021-09-20 NOTE — Discharge Instructions (Signed)
Apply the Clotrimazole twice daily to the rash until has resolves and then for 3 additional days. ? ?Place gauze in the skin folds to allow for airflow and to help dry out the rash so it resolves. ? ?Wear loose fitting shorts as much as possible, or go naked, to allow air to get to the area so it dries out. ? ?If the rash does not resolve, or it worsens, return for re-evaluation. See your PCP, or see dermatology. ?

## 2021-09-20 NOTE — ED Provider Notes (Signed)
[facility]    CSN: [id number] Arrival date & time: 09/20/21  0948      History   Chief Complaint Chief Complaint  Patient presents with   Rash    Appt: 10 am.     HPI James Clarke is a 81 y.o. male.   HPI  81 year old male here for evaluation of skin complaints.  Patient reports that he has been experiencing a red rash in his right inguinal fold for the past week.  He states he believes he developed irritation after wearing a new, stiff pair of Dungarees.  Since that time he has had an itching and burning red rash in the right inguinal fold.  He states that it is dry and denies any odor.  He has been applying Neosporin and A&E ointment without any improvement of his symptoms.  Past Medical History:  Diagnosis Date   Asthma    Bell's palsy    SEVERAL TIMES   Bell's palsy    Cancer ([place])    SKIN   Diabetes mellitus without complication ([place])    Dyspnea    DOE   History of kidney stones    Hyperlipemia    Hypertension    Kidney stone    Neuropathy    Obesity    Sleep apnea    C PAP    Patient Active Problem List   Diagnosis Date Noted   Right rotator cuff tear arthropathy 06/25/2021   Chronic right shoulder pain 06/25/2021   Localized osteoarthritis of right shoulder 06/25/2021   Chronic pain syndrome 23/76/2831   Acute systolic CHF (congestive heart failure) ([place]) 10/03/2020   Kidney stones 02/16/2020   Anemia in chronic kidney disease 07/26/2019   Chronic kidney disease, stage III (moderate) ([place]) 07/26/2019   Edema of lower extremity 07/26/2019   Malignant essential hypertension 07/26/2019   Secondary hyperparathyroidism of renal origin ([place]) 07/26/2019   Ureteral calculus 07/26/2019   Old complex tear of medial meniscus of right knee 10/07/2018   Primary osteoarthritis of right knee 10/07/2018   Fatty liver 04/16/2018   Aortic atherosclerosis ([place]) 04/16/2018   Bilateral leg edema 03/17/2018   Morbid obesity due to excess calories  ([place]) 11/11/2014   Microalbuminuria 11/11/2014   Long-term insulin use ([place]) 11/11/2014   Perennial allergic rhinitis 09/12/2014   Type 2 diabetes mellitus with renal manifestations ([place]) 03/19/2014   Sleep apnea 03/19/2014   Hypertension 03/19/2014   Hyperlipemia 03/19/2014    Past Surgical History:  Procedure Laterality Date   APPENDECTOMY     CATARACT EXTRACTION W/PHACO Left 05/20/2017   Procedure: CATARACT EXTRACTION PHACO AND INTRAOCULAR LENS PLACEMENT ([place]);  Surgeon: Birder Robson, MD;  Location: [facility];  Service: Ophthalmology;  Laterality: Left;  [place] 00:32.0 AP% 15.5 CDE 4.97 Fluid Pack Lot # [id number]   CATARACT EXTRACTION W/PHACO Right 06/10/2017   Procedure: CATARACT EXTRACTION PHACO AND INTRAOCULAR LENS PLACEMENT (I[facility]);  Surgeon: Birder Robson, MD;  Location: [facility];  Service: Ophthalmology;  Laterality: Right;  [place]  00:51 AP% 15.4 CDE 7.95 Fluid pack lot # [id number]   CHOLECYSTECTOMY     CYSTOSCOPY W/ RETROGRADES Right 08/13/2019   Procedure: CYSTOSCOPY WITH RETROGRADE PYELOGRAM;  Surgeon: Billey Co, MD;  Location: [facility];  Service: Urology;  Laterality: Right;   CYSTOSCOPY WITH STENT PLACEMENT Right 07/26/2019   Procedure: CYSTOSCOPY WITH STENT PLACEMENT;  Surgeon: Lucas Mallow, MD;  Location: [facility];  Service: Urology;  Laterality: Right;   CYSTOSCOPY/URETEROSCOPY/HOLMIUM LASER/STENT PLACEMENT Right 08/13/2019  Procedure: CYSTOSCOPY/URETEROSCOPY/HOLMIUM LASER/STENT EXCHANGE;  Surgeon: Billey Co, MD;  Location: [facility];  Service: Urology;  Laterality: Right;   FRACTURE SURGERY     WRIST   HAND SURGERY     HERNIA REPAIR     SHOULDER ARTHROSCOPY WITH SUBACROMIAL DECOMPRESSION AND OPEN ROTATOR C Right 09/11/2020   Procedure: Right shoulder arthroscopic rotator cuff repair vs Regeneten patch application, subacromial decompression, and biceps tenodesis - Reche Dixon to Assist;  Surgeon: Leim Fabry, MD;  Location: [facility];  Service:  Orthopedics;  Laterality: Right;   TONSILLECTOMY         Home Medications    Prior to Admission medications   Medication Sig Start Date End Date Taking? Authorizing Provider  albuterol (PROVENTIL HFA;VENTOLIN HFA) 108 (90 Base) MCG/ACT inhaler Inhale 2 puffs into the lungs every 6 (six) hours as needed for wheezing or shortness of breath.   Yes [provider]  amLODipine (NORVASC) 5 MG tablet Take 5 mg by mouth daily. 06/12/21  Yes [provider]  calcitRIOL (ROCALTROL) 0.25 MCG capsule Take 0.25 mcg by mouth daily. 07/13/19  Yes [provider]  Clotrimazole 1 % OINT Apply 1 application topically 2 (two) times daily. 09/20/21  Yes Margarette Canada, NP  furosemide (LASIX) 40 MG tablet Take 40 mg by mouth 3 (three) times a week. 05/06/18  Yes [provider]  insulin aspart (NOVOLOG) 100 UNIT/ML injection Inject 30 units before breakfast and lunch. Inject 55 units before supper. 08/15/21  Yes [provider]  insulin detemir (LEVEMIR) 100 UNIT/ML injection Inject into the skin. 08/15/21  Yes [provider]  JARDIANCE 25 MG TABS tablet Take 25 mg by mouth daily. 01/05/21  Yes [provider]  lovastatin (MEVACOR) 40 MG tablet Take 40 mg by mouth daily with supper. 04/28/17  Yes [provider]  metoprolol succinate (TOPROL-XL) 50 MG 24 hr tablet Take 50 mg by mouth daily. 09/01/21  Yes [provider]  neomycin-bacitracin-polymyxin (NEOSPORIN) ointment Apply 1 application topically every 12 (twelve) hours.   Yes [provider]  potassium chloride SA (K-DUR,KLOR-CON) 20 MEQ tablet Take 20 mEq by mouth daily. 04/28/17  Yes [provider]  valsartan (DIOVAN) 320 MG tablet  07/05/21  Yes [provider]  Vitamins A & D (VITAMIN A & D) ointment Apply 1 application topically as needed for dry skin.   Yes [provider]  GLOBAL INJECT EASE INSULIN SYR 31G X 5/16" 0.5 ML MISC Inject into the  skin. 08/16/21   [provider]  GLOBAL INJECT EASE INSULIN SYR 31G X 5/16" 1 ML MISC 615 mg.  08/16/19   [provider]  glucose blood (ONETOUCH ULTRA) test strip TEST BLOOD SUGAR 4 TIMES DAILY 10/26/18   [provider]  Lancets (ONETOUCH DELICA PLUS [id number]) [place] USE 1 LANCET 4 TIMES DAILY 05/04/19   [provider]    Family History Family History  Problem Relation Age of Onset   Emphysema Mother    COPD Mother    Heart disease Mother    Brain cancer Father     Social History Social History   Tobacco Use   Smoking status: Never   Smokeless tobacco: Never  Vaping Use   Vaping Use: Never used  Substance Use Topics   Alcohol use: No   Drug use: Not Currently     Allergies   Codeine, Doxycycline, and Erythromycin   Review of Systems Review of Systems  Constitutional:  Negative for fever.  Skin:  Positive for color change and rash.  Hematological: Negative.   Psychiatric/Behavioral: Negative.      Physical Exam Triage Vital Signs ED Triage Vitals  Enc Vitals Group     BP 09/20/21 1017 (!) 158/70     Pulse Rate 09/20/21 1017 73     Resp 09/20/21 1017 (!) 22     Temp 09/20/21 1017 98.1 F (36.7 C)     Temp Source 09/20/21 1017 Oral     SpO2 09/20/21 1017 99 %     Weight 09/20/21 1012 (!) 330 lb 0.5 oz (149.7 kg)     Height --      Head Circumference --      Peak Flow --      Pain Score 09/20/21 1012 0     Pain Loc --      Pain Edu? --      Excl. in [place]? --    No data found.  Updated Vital Signs BP (!) 158/70 (BP Location: Left Arm)    Pulse 73    Temp 98.1 F (36.7 C) (Oral)    Resp (!) 22    Wt (!) 330 lb 0.5 oz (149.7 kg)    SpO2 99%    BMI 47.35 kg/m   Visual Acuity Right Eye Distance:   Left Eye Distance:   Bilateral Distance:    Right Eye Near:   Left Eye Near:    Bilateral Near:     Physical Exam Vitals and nursing note reviewed.  Constitutional:      Appearance: Normal appearance. He is not  ill-appearing.  Skin:    General: Skin is warm and dry.     Capillary Refill: Capillary refill takes less than 2 seconds.     Findings: Erythema and rash present.  Neurological:     General: No focal deficit present.     Mental Status: He is alert and oriented to person, place, and time.  Psychiatric:        Mood and Affect: Mood normal.        Behavior: Behavior normal.        Thought Content: Thought content normal.        Judgment: Judgment normal.     UC Treatments / Results  Labs (all labs ordered are listed, but only abnormal results are displayed) Labs Reviewed - No data to display  EKG   Radiology No results found.  Procedures Procedures (including critical care time)  Medications Ordered in UC Medications - No data to display  Initial Impression / Assessment and Plan / UC Course  I have reviewed the triage vital signs and the nursing notes.  Pertinent labs & imaging results that were available during my care of the patient were reviewed by me and considered in my medical decision making (see chart for details).  Patient is a very pleasant, nontoxic-appearing 81 year old male here for evaluation of rash in his right inguinal fold that is been present for the past week as outlined in HPI above.  On exam patient does have macular, erythematous patches on his pannus as well as his inner thigh on the right.  The left is free of lesions.  The lesions are dry and blanchable.  His exam is consistent with tinea cruris.  I have advised him to stop using the A&E ointment and Neosporin as it is not helping.  I will have him use clotrimazole twice daily until the rash resolves and then for 3 additional days to  help ensure that it stays resolved.  I have also encouraged him to use 4 x 4 gauze pads and place them in the folds of skin to keep the skin separated and allow airflow to help resolve the issue.  If his symptoms do not improve he is to return for reevaluation, see his PCP, or  see dermatology.   Final Clinical Impressions(s) / UC Diagnoses   Final diagnoses:  Tinea cruris     Discharge Instructions      Apply the Clotrimazole twice daily to the rash until has resolves and then for 3 additional days.  Place gauze in the skin folds to allow for airflow and to help dry out the rash so it resolves.  Wear loose fitting shorts as much as possible, or go naked, to allow air to get to the area so it dries out.  If the rash does not resolve, or it worsens, return for re-evaluation. See your PCP, or see dermatology.     ED Prescriptions     Medication Sig Dispense Auth. Provider   Clotrimazole 1 % OINT Apply 1 application topically 2 (two) times daily. 56.7 g Margarette Canada, NP      PDMP not reviewed this encounter.   Margarette Canada, NP 09/20/21 1050

## 2021-09-24 DIAGNOSIS — N184 Chronic kidney disease, stage 4 (severe): Secondary | ICD-10-CM | POA: Diagnosis not present

## 2021-09-24 DIAGNOSIS — M9904 Segmental and somatic dysfunction of sacral region: Secondary | ICD-10-CM | POA: Diagnosis not present

## 2021-09-24 DIAGNOSIS — M5417 Radiculopathy, lumbosacral region: Secondary | ICD-10-CM | POA: Diagnosis not present

## 2021-09-24 DIAGNOSIS — M9903 Segmental and somatic dysfunction of lumbar region: Secondary | ICD-10-CM | POA: Diagnosis not present

## 2021-09-24 DIAGNOSIS — M5416 Radiculopathy, lumbar region: Secondary | ICD-10-CM | POA: Diagnosis not present

## 2021-09-24 DIAGNOSIS — R300 Dysuria: Secondary | ICD-10-CM | POA: Diagnosis not present

## 2021-09-25 DIAGNOSIS — E1122 Type 2 diabetes mellitus with diabetic chronic kidney disease: Secondary | ICD-10-CM | POA: Diagnosis not present

## 2021-09-25 DIAGNOSIS — Z6841 Body Mass Index (BMI) 40.0 and over, adult: Secondary | ICD-10-CM | POA: Diagnosis not present

## 2021-09-25 DIAGNOSIS — N184 Chronic kidney disease, stage 4 (severe): Secondary | ICD-10-CM | POA: Diagnosis not present

## 2021-09-25 DIAGNOSIS — E1151 Type 2 diabetes mellitus with diabetic peripheral angiopathy without gangrene: Secondary | ICD-10-CM | POA: Diagnosis not present

## 2021-09-25 DIAGNOSIS — E662 Morbid (severe) obesity with alveolar hypoventilation: Secondary | ICD-10-CM | POA: Diagnosis not present

## 2021-09-25 DIAGNOSIS — I509 Heart failure, unspecified: Secondary | ICD-10-CM | POA: Diagnosis not present

## 2021-10-04 DIAGNOSIS — B356 Tinea cruris: Secondary | ICD-10-CM | POA: Diagnosis not present

## 2021-10-08 DIAGNOSIS — E1122 Type 2 diabetes mellitus with diabetic chronic kidney disease: Secondary | ICD-10-CM | POA: Diagnosis not present

## 2021-10-08 DIAGNOSIS — M9903 Segmental and somatic dysfunction of lumbar region: Secondary | ICD-10-CM | POA: Diagnosis not present

## 2021-10-08 DIAGNOSIS — R300 Dysuria: Secondary | ICD-10-CM | POA: Diagnosis not present

## 2021-10-08 DIAGNOSIS — I10 Essential (primary) hypertension: Secondary | ICD-10-CM | POA: Diagnosis not present

## 2021-10-08 DIAGNOSIS — M5416 Radiculopathy, lumbar region: Secondary | ICD-10-CM | POA: Diagnosis not present

## 2021-10-08 DIAGNOSIS — N2581 Secondary hyperparathyroidism of renal origin: Secondary | ICD-10-CM | POA: Diagnosis not present

## 2021-10-08 DIAGNOSIS — M9904 Segmental and somatic dysfunction of sacral region: Secondary | ICD-10-CM | POA: Diagnosis not present

## 2021-10-08 DIAGNOSIS — N184 Chronic kidney disease, stage 4 (severe): Secondary | ICD-10-CM | POA: Diagnosis not present

## 2021-10-08 DIAGNOSIS — R600 Localized edema: Secondary | ICD-10-CM | POA: Diagnosis not present

## 2021-10-08 DIAGNOSIS — M5417 Radiculopathy, lumbosacral region: Secondary | ICD-10-CM | POA: Diagnosis not present

## 2021-10-09 DIAGNOSIS — G4733 Obstructive sleep apnea (adult) (pediatric): Secondary | ICD-10-CM | POA: Diagnosis not present

## 2021-10-10 ENCOUNTER — Telehealth: Payer: PPO | Admitting: Student in an Organized Health Care Education/Training Program

## 2021-10-10 DIAGNOSIS — N2581 Secondary hyperparathyroidism of renal origin: Secondary | ICD-10-CM | POA: Diagnosis not present

## 2021-10-10 DIAGNOSIS — N184 Chronic kidney disease, stage 4 (severe): Secondary | ICD-10-CM | POA: Diagnosis not present

## 2021-10-10 DIAGNOSIS — E1122 Type 2 diabetes mellitus with diabetic chronic kidney disease: Secondary | ICD-10-CM | POA: Diagnosis not present

## 2021-10-10 DIAGNOSIS — D631 Anemia in chronic kidney disease: Secondary | ICD-10-CM | POA: Diagnosis not present

## 2021-10-10 DIAGNOSIS — I10 Essential (primary) hypertension: Secondary | ICD-10-CM | POA: Diagnosis not present

## 2021-10-10 DIAGNOSIS — N390 Urinary tract infection, site not specified: Secondary | ICD-10-CM | POA: Diagnosis not present

## 2021-10-10 DIAGNOSIS — R809 Proteinuria, unspecified: Secondary | ICD-10-CM | POA: Diagnosis not present

## 2021-10-10 NOTE — Progress Notes (Signed)
Patient's wife brought diary in for you to have during VV.  ?

## 2021-10-11 ENCOUNTER — Other Ambulatory Visit: Payer: Self-pay

## 2021-10-11 ENCOUNTER — Ambulatory Visit
Payer: PPO | Attending: Student in an Organized Health Care Education/Training Program | Admitting: Student in an Organized Health Care Education/Training Program

## 2021-10-11 ENCOUNTER — Encounter: Payer: Self-pay | Admitting: Student in an Organized Health Care Education/Training Program

## 2021-10-11 DIAGNOSIS — M12811 Other specific arthropathies, not elsewhere classified, right shoulder: Secondary | ICD-10-CM

## 2021-10-11 DIAGNOSIS — M75101 Unspecified rotator cuff tear or rupture of right shoulder, not specified as traumatic: Secondary | ICD-10-CM | POA: Diagnosis not present

## 2021-10-11 DIAGNOSIS — M25511 Pain in right shoulder: Secondary | ICD-10-CM

## 2021-10-11 DIAGNOSIS — G894 Chronic pain syndrome: Secondary | ICD-10-CM | POA: Diagnosis not present

## 2021-10-11 DIAGNOSIS — M19011 Primary osteoarthritis, right shoulder: Secondary | ICD-10-CM | POA: Diagnosis not present

## 2021-10-11 DIAGNOSIS — G8929 Other chronic pain: Secondary | ICD-10-CM | POA: Diagnosis not present

## 2021-10-11 NOTE — Progress Notes (Signed)
Patient: James Clarke  Service Category: E/M  Provider: Bilal Lateef, MD  ?DOB: 09/22/1940  DOS: 10/11/2021  Location: Office  ?MRN: [id number]  Setting: Ambulatory outpatient  Referring Provider: Feldpausch, Dale E, MD  ?Type: Established Patient  Specialty: Interventional Pain Management  PCP: Feldpausch, Dale E, MD  ?Location: Remote location  Delivery: TeleHealth    ? ?Virtual Encounter - Pain Management ?PROVIDER NOTE: Information contained herein reflects review and annotations entered in association with encounter. Interpretation of such information and data should be left to medically-trained personnel. Information provided to patient can be located elsewhere in the medical record under "Patient Instructions". Document created using STT-dictation technology, any transcriptional errors that may result from process are unintentional.  ?  ?Contact & Pharmacy ?Preferred: [phone] ?Home: [phone] (home) ?Mobile: [phone] (mobile) ?E-mail: [email or web address]  ?[id number] - [place], [place] - [address] ?[address] ?[place] [address] ?Phone: [phone] Fax: [phone] ? ?[facility] - [place], [place] - [address] ?[address] ?[place] [address] ?Phone: [phone] Fax: [phone] ? ?[id number] - [place], [place] - [address] [place] [address] ?[address] [address] ?[place] [address] ?Phone: [phone] Fax: [phone] ?  ?Pre-screening  ?[name] offered "in-person" vs "virtual" encounter. He indicated preferring virtual for this encounter.  ? ?Reason ?COVID-19*  Social distancing based on CDC and AMA recommendations.  ? ?I contacted Versie Gene Cronic on 10/11/2021 via telephone.      I clearly identified myself as Bilal Lateef, MD. I verified that I was speaking with the correct person using two identifiers (Name: James Clarke, and date of birth: 05/11/1941). ? ?Consent ?I sought verbal advanced consent from Joah Gene  Null for virtual visit interactions. I informed Mr. Spelman of possible security and privacy concerns, risks, and limitations associated with providing "not-in-person" medical evaluation and management services. I also informed Mr. Gonzalo of the availability of "in-person" appointments. Finally, I informed him that there would be a charge for the virtual visit and that he could be  personally, fully or partially, financially responsible for it. Mr. Arts expressed understanding and agreed to proceed.  ? ?Historic Elements   ?[name] is a 81 y.o. year old, male patient evaluated today after our last contact on 09/12/2021. [name]  has a past medical history of Asthma, Bell's palsy, Bell's palsy, Cancer (HCC), Diabetes mellitus without complication (HCC), Dyspnea, History of kidney stones, Hyperlipemia, Hypertension, Kidney stone, Neuropathy, Obesity, and Sleep apnea. He also  has a past surgical history that includes Cholecystectomy; Hernia repair; Appendectomy; Fracture surgery; Tonsillectomy; Hand surgery; Cataract extraction w/PHACO (Left, 05/20/2017); Cataract extraction w/PHACO (Right, 06/10/2017); Cystoscopy/ureteroscopy/holmium laser/stent placement (Right, 08/13/2019); Cystoscopy w/ retrogrades (Right, 08/13/2019); Cystoscopy with stent placement (Right, 07/26/2019); and Shoulder arthroscopy with subacromial decompression and open rotator cuff repair, open bicep tendon repair (Right, 09/11/2020). [name] has a current medication list which includes the following prescription(s): albuterol, amlodipine, calcitriol, clotrimazole, furosemide, global inject ease insulin syr, global inject ease insulin syr, onetouch ultra, insulin aspart, insulin detemir, jardiance, onetouch delica plus lancet30g, lovastatin, metoprolol succinate, neomycin-bacitracin-polymyxin, potassium chloride sa, valsartan, and vitamin a & d. He  reports that he has never smoked. He has never used smokeless tobacco. He reports that he does  not currently use drugs. He reports that he does not drink alcohol. [name] is allergic to codeine, doxycycline, and erythromycin.  ? ?HPI  ?Today, he   is being contacted for a post-procedure assessment. ? ? ?Post-procedure evaluation  ?  Type: Suprascapular nerve pulsed Radiofrequency Ablation (RFA) #1  ?Laterality: Right  ?Level: Superior to the scapular spine, in the lateral aspect of the supraspinatus fossa (Suprescapular notch).  ?Imaging: Fluoroscopic guidance ?Anesthesia: Local anesthesia (1-2% Lidocaine) ?Anxiolysis: Oral (Valium 5 mg) ?Sedation: minimal anxiolysis.  ?DOS: 09/12/2021  ?Performed by: Gillis Santa, MD ? ?Purpose: Therapeutic ?Indications: Shoulder pain severe enough to impact quality of life or function. ?Indications: ?1. Right rotator cuff tear arthropathy   ?2. Chronic right shoulder pain   ?3. Localized osteoarthritis of right shoulder   ?4. Chronic pain syndrome   ? ?[name] has been dealing with the above chronic pain for longer than three months and has either failed to respond, was unable to tolerate, or simply did not get enough benefit from other more conservative therapies including, but not limited to: ?1. Over-the-counter medications ?2. Anti-inflammatory medications ?3. Muscle relaxants ?4. Membrane stabilizers ?5. Opioids ?6. Physical therapy and/or chiropractic manipulation ?7. Modalities (Heat, ice, etc.) ?8. Invasive techniques such as nerve blocks. ?[name] has attained more than 50% relief of the pain from a series of diagnostic injections conducted in separate occasions. ? ?Pain Score: ?Pre-procedure: 4 /10 ?Post-procedure: 0/10, significantly improved right shoulder range of motion after RFA ? ?   ?Effectiveness:  ?Initial hour after procedure: 100 %  ?Subsequent 4-6 hours post-procedure: 100 %  ?Analgesia past initial 6 hours: 100 %  ?Ongoing improvement:  ?Analgesic:  90% ?Function: [name] reports improvement in function ?ROM: [name] reports improvement in  ROM ? ? ?Laboratory Chemistry Profile  ? ?Renal ?Lab Results  ?Component Value Date  ? BUN 79 (H) 10/17/2020  ? CREATININE 2.35 (H) 10/17/2020  ? GFRAA 30 (L) 03/13/2020  ? GFRNONAA 27 (L) 10/17/2020  ?  Hepatic ?Lab Results  ?Component Value Date  ? AST 18 03/13/2020  ? ALT 14 03/13/2020  ? ALBUMIN 3.4 (L) 10/17/2020  ? ALKPHOS 65 03/13/2020  ? LIPASE 28 08/12/2015  ?  ?Electrolytes ?Lab Results  ?Component Value Date  ? NA 139 10/17/2020  ? K 3.6 10/17/2020  ? CL 100 10/17/2020  ? CALCIUM 9.6 10/17/2020  ? PHOS 3.5 10/17/2020  ?  Bone ?No results found for: [place], [id number], [id number], [id number], 25OHVITD1, 25OHVITD2, 25OHVITD3, TESTOFREE, TESTOSTERONE  ?Inflammation (CRP: Acute Phase) (ESR: Chronic Phase) ?No results found for: CRP, ESRSEDRATE, LATICACIDVEN    ?  ? ?Note: Above Lab results reviewed. ? ? ?Assessment  ?The primary encounter diagnosis was Right rotator cuff tear arthropathy. Diagnoses of Chronic right shoulder pain, Localized osteoarthritis of right shoulder, and Chronic pain syndrome were also pertinent to this visit. ? ?Plan of Care  ? ?Good pain relief and functional improvement after right suprascapular nerve RFA done 09/12/2021.  We will continue to monitor symptoms, repeat if and when shoulder pain returns. ? ?Follow-up plan:   ?Return if symptoms worsen or fail to improve.   ?  ?Right SSNB #1 06/27/21, #2 08/01/21, RFA 09/12/21 ?  ?  ?Recent Visits ?Date Type Provider Dept  ?09/12/21 Procedure visit Gillis Santa, MD [facility]  ?08/29/21 Office Visit Gillis Santa, MD [facility]  ?08/01/21 Procedure visit Gillis Santa, MD [facility]  ?07/25/21 Office Visit Gillis Santa, MD [facility]  ?Showing recent visits within past 90 days and meeting all other requirements ?Today's Visits ?Date Type Provider Dept  ?10/11/21 Office Visit Gillis Santa, MD [facility]  ?  Showing today's visits and meeting all other requirements ?Future Appointments ?No  visits were found meeting these conditions. ?Showing future appointments within next 90 days and meeting all other requirements ? ?I discussed the assessment and treatment plan with the patient. The patient was pr

## 2021-10-19 IMAGING — CT CT RENAL STONE PROTOCOL
2 of 4 series · 16 of 46 positions shown, 18 images · non-contrast
Comparison: 04/13/2018.

CLINICAL DATA: Left flank pain for 2 weeks with hematuria.

EXAM:
CT ABDOMEN AND PELVIS WITHOUT CONTRAST
TECHNIQUE: Multidetector CT imaging of the abdomen and pelvis was performed
following the standard protocol without IV contrast.

[Series 2: stone full standard · axial · 0.98mm/px · z∈[-561,-76]mm · 13 of 107 slices shown, 15 images]
[im 5/107  soft-tissue]
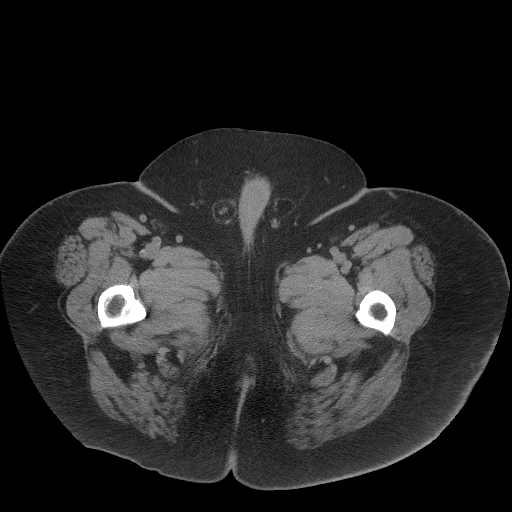
[im 5/107  bone]
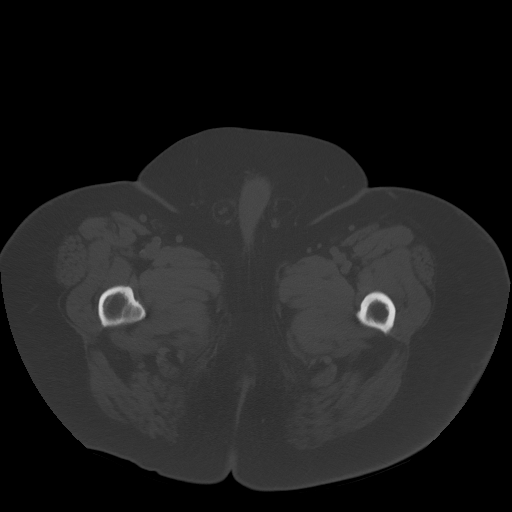
[im 13/107  soft-tissue]
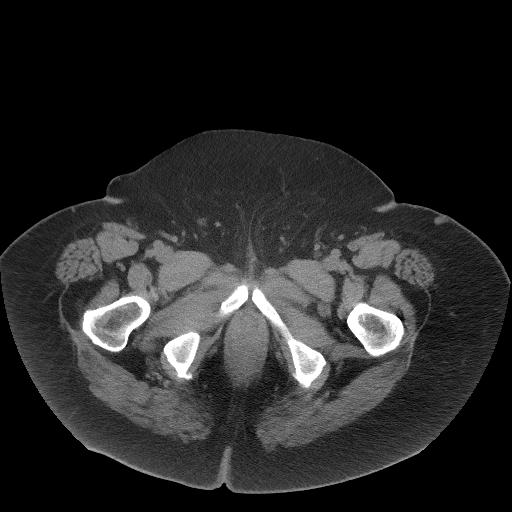
[im 22/107  soft-tissue]
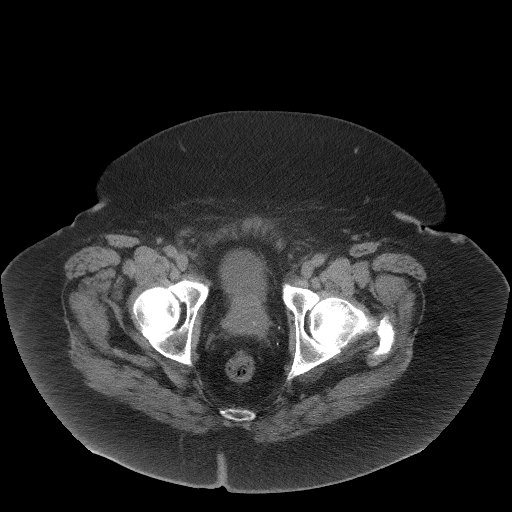
[im 30/107  soft-tissue]
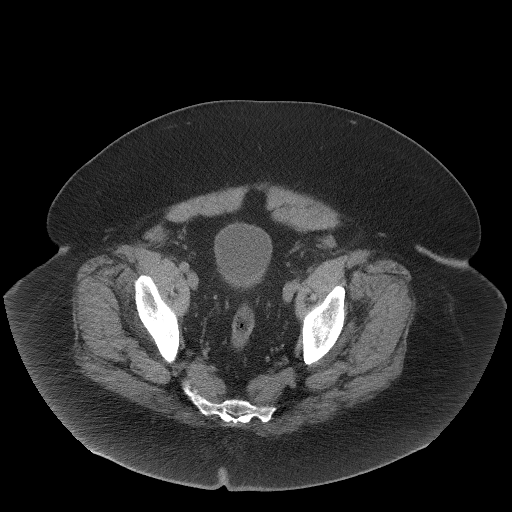
[im 39/107  soft-tissue]
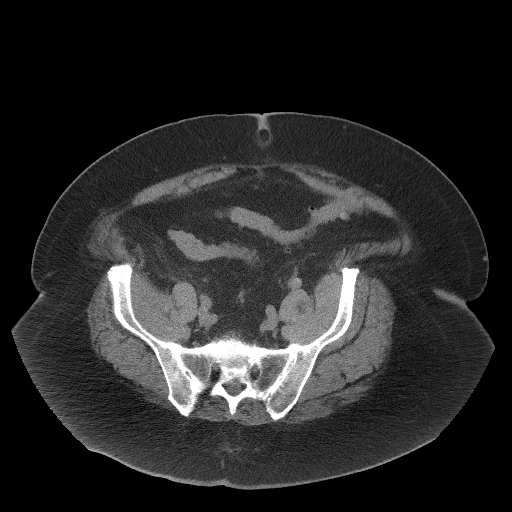
[im 47/107  soft-tissue]
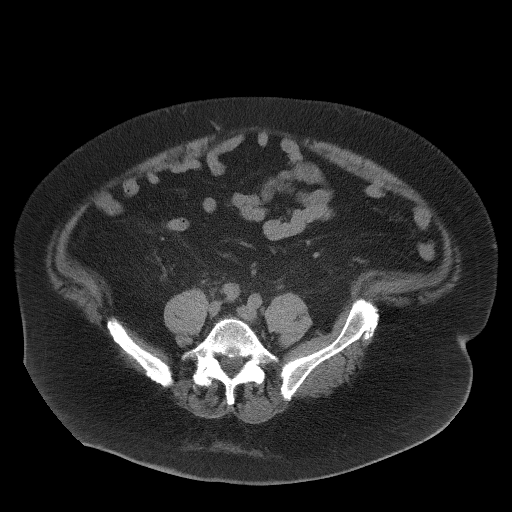
[im 56/107  soft-tissue]
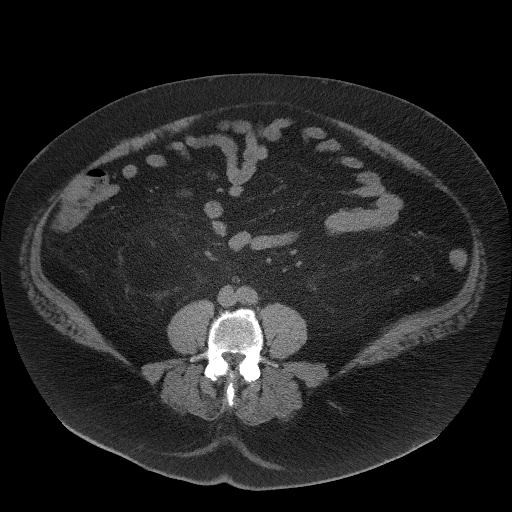
[im 60/107  soft-tissue]
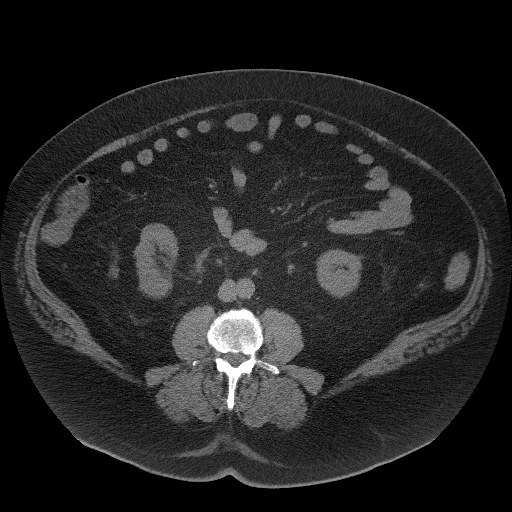
[im 68/107  soft-tissue]
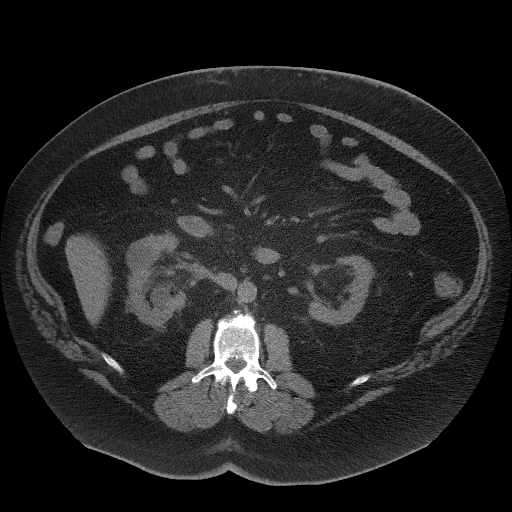
[im 68/107  bone]
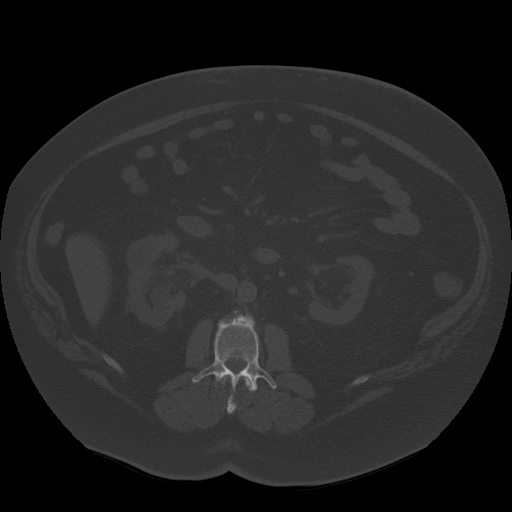
[im 77/107  soft-tissue]
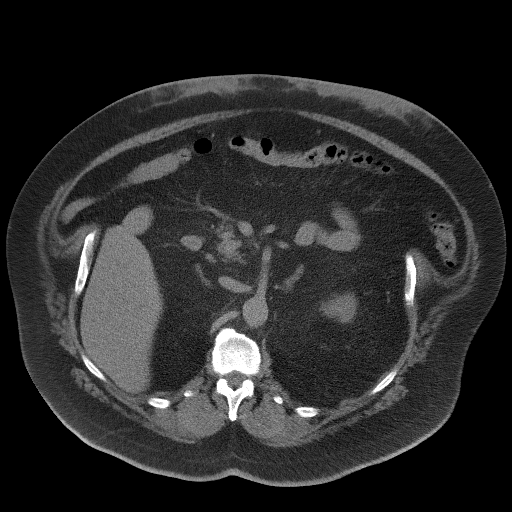
[im 85/107  soft-tissue]
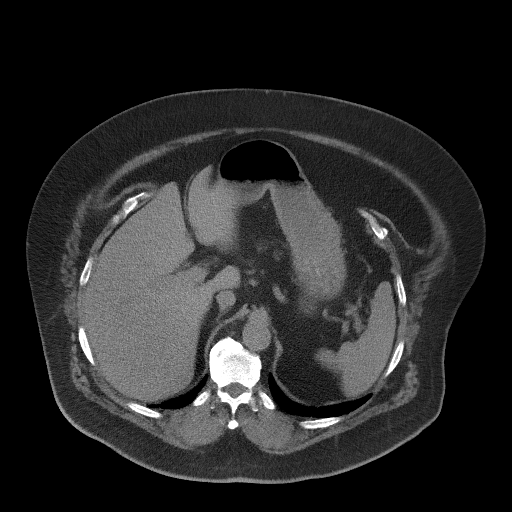
[im 94/107  soft-tissue]
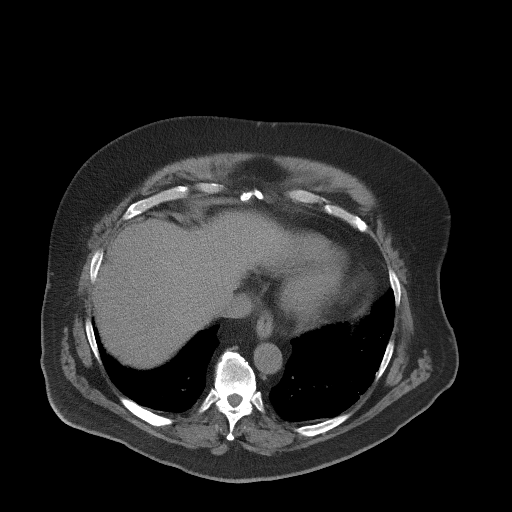
[im 102/107  soft-tissue]
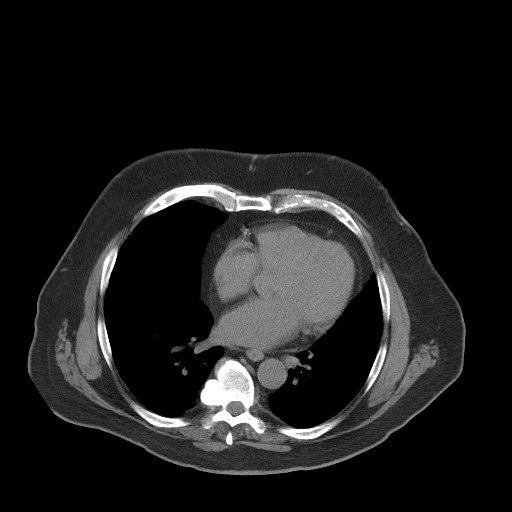

[Series 5: coronal · coronal · 1.00mm/px · 3 of 205 slices shown]
[im 69/205  soft-tissue]
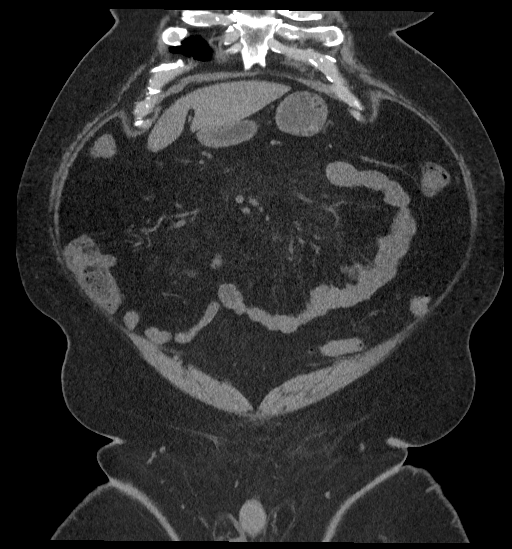
[im 91/205  soft-tissue]
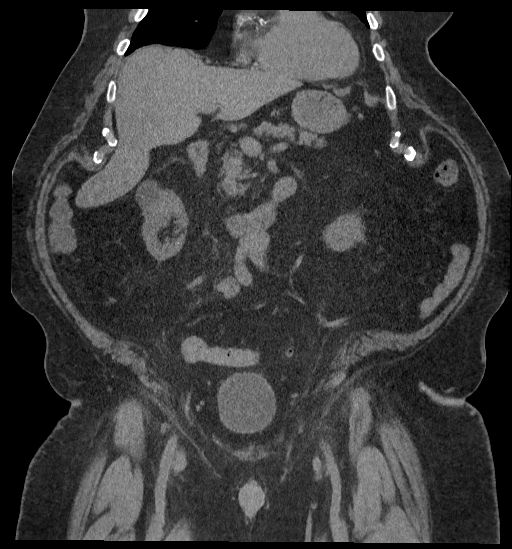
[im 114/205  soft-tissue]
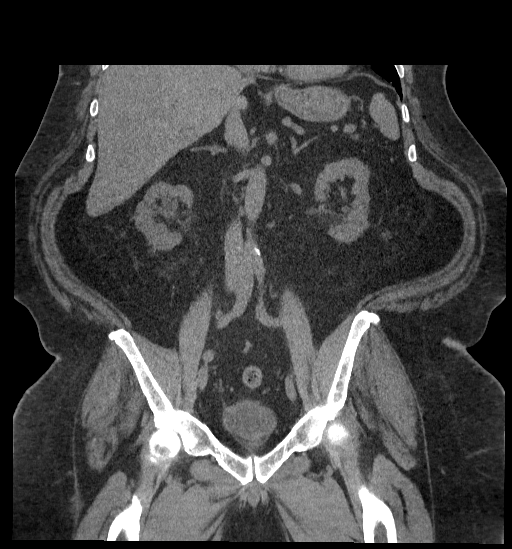

[16 of 46 positions shown; findings below may reference images not displayed]

FINDINGS: Lower chest: Peripheral high density pulmonary parenchymal
nodularity, as before, possibly postinfectious or post inflammatory
in etiology. Heart is enlarged. Coronary artery calcification. No
pericardial or pleural effusion.

Hepatobiliary: Liver is unremarkable. Cholecystectomy. No biliary
ductal dilatation.

Pancreas: Negative.

Spleen: Negative.

Adrenals/Urinary Tract: Adrenal glands are unremarkable.
Low-attenuation lesion in the right kidney measures 3.3 cm and is
likely a cyst. Stones are seen in the right kidney, including within
the right renal pelvis, measuring 13 mm. There may be mild
associated pelvocaliectasis and adjacent haziness. No left renal
stones. Ureters are decompressed. Bladder is fairly low in volume.

Stomach/Bowel: Stomach, small bowel and colon are unremarkable.
Appendix is not readily visualized.

Vascular/Lymphatic: Atherosclerotic calcification of the aorta
without aneurysm. No pathologically enlarged lymph nodes.

Reproductive: Prostate is visualized.

Other: Small umbilical and bilateral inguinal hernias contain fat.
No free fluid.

Musculoskeletal: Degenerative changes in the spine. No worrisome
lytic or sclerotic lesions.
IMPRESSION: 1. Right renal stones with a 13 mm stone in the right renal pelvis.
Associated right pelvocaliectasis and surrounding haziness,
suggesting slight obstruction.
2. No left renal stones or obstruction.
3. Aortic atherosclerosis ([id number]). Coronary artery
calcification.

## 2021-10-30 ENCOUNTER — Ambulatory Visit: Payer: PPO | Admitting: Urology

## 2021-10-30 ENCOUNTER — Ambulatory Visit: Payer: PPO | Attending: Infectious Diseases | Admitting: Infectious Diseases

## 2021-10-30 ENCOUNTER — Encounter: Payer: Self-pay | Admitting: Urology

## 2021-10-30 ENCOUNTER — Encounter: Payer: Self-pay | Admitting: Infectious Diseases

## 2021-10-30 VITALS — BP 175/87 | HR 75 | Resp 16 | Ht 70.0 in | Wt 330.0 lb

## 2021-10-30 VITALS — BP 166/82 | HR 86 | Ht 70.0 in | Wt 329.0 lb

## 2021-10-30 DIAGNOSIS — K760 Fatty (change of) liver, not elsewhere classified: Secondary | ICD-10-CM | POA: Insufficient documentation

## 2021-10-30 DIAGNOSIS — Z7984 Long term (current) use of oral hypoglycemic drugs: Secondary | ICD-10-CM | POA: Diagnosis not present

## 2021-10-30 DIAGNOSIS — E1122 Type 2 diabetes mellitus with diabetic chronic kidney disease: Secondary | ICD-10-CM | POA: Diagnosis not present

## 2021-10-30 DIAGNOSIS — N401 Enlarged prostate with lower urinary tract symptoms: Secondary | ICD-10-CM

## 2021-10-30 DIAGNOSIS — Z794 Long term (current) use of insulin: Secondary | ICD-10-CM | POA: Insufficient documentation

## 2021-10-30 DIAGNOSIS — Z79899 Other long term (current) drug therapy: Secondary | ICD-10-CM | POA: Insufficient documentation

## 2021-10-30 DIAGNOSIS — Z7901 Long term (current) use of anticoagulants: Secondary | ICD-10-CM | POA: Diagnosis not present

## 2021-10-30 DIAGNOSIS — M25511 Pain in right shoulder: Secondary | ICD-10-CM | POA: Insufficient documentation

## 2021-10-30 DIAGNOSIS — R8271 Bacteriuria: Secondary | ICD-10-CM

## 2021-10-30 DIAGNOSIS — N189 Chronic kidney disease, unspecified: Secondary | ICD-10-CM | POA: Diagnosis not present

## 2021-10-30 DIAGNOSIS — Z6841 Body Mass Index (BMI) 40.0 and over, adult: Secondary | ICD-10-CM | POA: Insufficient documentation

## 2021-10-30 DIAGNOSIS — N184 Chronic kidney disease, stage 4 (severe): Secondary | ICD-10-CM

## 2021-10-30 DIAGNOSIS — R809 Proteinuria, unspecified: Secondary | ICD-10-CM | POA: Diagnosis not present

## 2021-10-30 DIAGNOSIS — R5383 Other fatigue: Secondary | ICD-10-CM | POA: Insufficient documentation

## 2021-10-30 DIAGNOSIS — R339 Retention of urine, unspecified: Secondary | ICD-10-CM

## 2021-10-30 DIAGNOSIS — I129 Hypertensive chronic kidney disease with stage 1 through stage 4 chronic kidney disease, or unspecified chronic kidney disease: Secondary | ICD-10-CM | POA: Diagnosis not present

## 2021-10-30 DIAGNOSIS — B952 Enterococcus as the cause of diseases classified elsewhere: Secondary | ICD-10-CM | POA: Diagnosis not present

## 2021-10-30 DIAGNOSIS — E119 Type 2 diabetes mellitus without complications: Secondary | ICD-10-CM

## 2021-10-30 DIAGNOSIS — R600 Localized edema: Secondary | ICD-10-CM | POA: Insufficient documentation

## 2021-10-30 DIAGNOSIS — R350 Frequency of micturition: Secondary | ICD-10-CM | POA: Diagnosis not present

## 2021-10-30 LAB — BLADDER SCAN AMB NON-IMAGING

## 2021-10-30 NOTE — Patient Instructions (Signed)
Asymptomatic Bacteriuria ?Asymptomatic bacteriuria is the presence of a large number of bacteria in the urine without the usual symptoms of burning. This is usually not harmful, and treatment may not be needed. A person with this condition will not be more likely to develop an infection in the future. ?What are the causes? ?This condition is caused by an increase in bacteria in the urine. This increase can be caused by: ?Bacteria entering the urinary tract, such as during sex. ?A blockage in the urinary tract, such as from kidney stones or a tumor. ?Bladder problems that prevent the bladder from emptying. ?What increases the risk? ?You are more likely to develop this condition if: ?You have diabetes. ?You are an older adult. This especially affects older adults in long-term care facilities. ?You are pregnant and in the first trimester. ?You have kidney stones. ?You are male. ?You have had a kidney transplant. ?You have a leaky kidney tube valve (reflux). ?You had a urinary catheter for a long period of time. This is a long, thin tube that collects urine. ?What are the signs or symptoms? ?There are no symptoms of this condition. ?How is this diagnosed? ?This condition is diagnosed with a urine test. Because this condition does not cause symptoms, it is usually diagnosed when a urine sample is taken to treat or diagnose another condition, such as pregnancy or kidney problems. Most women who are in their first trimester of pregnancy are screened for asymptomatic bacteriuria. ?How is this treated? ?Usually, treatment is not needed for this condition. Treating the condition can lead to other problems, such as a yeast infection or the growth of bacteria that do not respond to treatment (antibiotic-resistant bacteria). ?Some people do need treatment with antibiotic medicines to prevent kidney infection, known as pyelonephritis. Treatment is needed if: ?You are pregnant. In pregnant women, kidney infection can lead  to: ?Early labor (premature labor). ?Very low birth weight (fetal growth restriction). ?Newborn death. ?You are having a procedure that affects the urinary tract. ?You have had a kidney transplant. ?If you are diagnosed with this condition, talk with your health care provider about any concerns that you have. ?Follow these instructions at home: ?Medicines ?Take over-the-counter and prescription medicines only as told by your health care provider. ?If you were prescribed an antibiotic medicine, take it as told by your health care provider. Do not stop using the antibiotic even if you start to feel better. ?General instructions ?Monitor your condition for any changes. ?Drink enough fluid to keep your urine pale yellow. ?Urinate more often to keep your bladder empty. ?If you are male, keep the area around your vagina and rectum clean. Wipe from front to back after urinating or having a bowel movement. Use each piece of toilet paper only once. ?Keep all follow-up visits. This is important. ?Contact a health care provider if: ?You have symptoms of a urine infection, such as: ?A burning sensation, or pain when you urinate. ?A strong need to urinate, or urinating more often. ?Urine turning discolored or cloudy. ?Blood in your urine. ?Urine that smells bad. ?Get help right away if: ?You develop signs of a kidney infection, such as: ?Back pain or pelvic pain. ?A fever or chills. ?Nausea or vomiting. ?Severe pain that cannot be controlled with medicine. ?Summary ?Asymptomatic bacteriuria is the presence of a large number of bacteria in the urine without the usual symptoms of burning or frequent urination. ?Usually, treatment is not needed for this condition. Treating the condition can lead to other problems,  such as a yeast infection or the growth of bacteria that do not respond to treatment. ?Some people do need treatment. Treatment is needed if you are pregnant, if you are having a procedure that affects the urinary  tract, or if you have had a kidney transplant. ?If you were prescribed an antibiotic medicine, take it as told by your health care provider. Do not stop using the antibiotic even if you start to feel better. ?This information is not intended to replace advice given to you by your health care provider. Make sure you discuss any questions you have with your health care provider. ?Document Revised: 02/18/2020 Document Reviewed: 02/18/2020 ?Elsevier Patient Education ? Magazine. ? ?

## 2021-10-30 NOTE — Patient Instructions (Addendum)
You are here for urine culture positive for enterococcus and you have taken tw=ice antibiotics in [place]- you have urinary symptoms of frequency, nocturia, and double peeing. You may have prostate enlargement- which is keeping the bladder from emptying completely- you need to see Dr.Sninsky for some tests and if needed then we can  repeat culture and treat. ?I have communicated with Dr.Sninsky. follow PRN ?

## 2021-10-30 NOTE — Progress Notes (Signed)
NAME: James Clarke  ?DOB: [birth year]  ?MRN: [id number]  ?Date/Time: 10/30/2021 9:56 AM ? ?REQUESTING PROVIDER: Dr.Lateef ?Subjective:  ?REASON FOR CONSULT: UTI ??pt is here with his wife ?James Clarke is a 81 y.o. male with a history of DM, HTN. Morbid obesity, fatty liver is referred to me for enterococcus in urine- pt has frequency, nocturia , double peeing for the past few months- As his creatinine was worsening Dr.Lateef was seeing him and he checked his urine- proteinuria++. HE did UA and UC and it was enterococcus fecalis Urine culture f(rom 3/6 and 3/20) and he was prescribed Augmentin for 7 days and then 14 days which he completed last week.  ?HE has no feve ror chills ?No renal angle pain ?No abdominal pain ?No hematuria ?HE has shortness of breath ?Edema legs ?Recurrent cellulitis legs for which he has taken antibiotics ?Pain rt shoulder s/p Rotator cuff repair ?Poor mobility  ?Fatigue while walking ?Weight gain ? ?H/o renal stones and h/o stent placement- followed by Dr.sninsky- last seen aug 2022 ?  ?Past Medical History:  ?Diagnosis Date  ? Asthma   ? Bell's palsy   ? SEVERAL TIMES  ? Bell's palsy   ? Cancer [facility])   ? SKIN  ? Diabetes mellitus without complication ([place])   ? Dyspnea   ? DOE  ? History of kidney stones   ? Hyperlipemia   ? Hypertension   ? Kidney stone   ? Neuropathy   ? Obesity   ? Sleep apnea   ? C PAP  ?  ?Past Surgical History:  ?Procedure Laterality Date  ? APPENDECTOMY    ? CATARACT EXTRACTION W/PHACO Left 05/20/2017  ? Procedure: CATARACT EXTRACTION PHACO AND INTRAOCULAR LENS PLACEMENT (IOC);  Surgeon: Birder Robson, MD;  Location: [facility] ORS;  Service: Ophthalmology;  Laterality: Left;  [place] 00:32.0 ?AP% 15.5 ?CDE 4.97 ?Fluid Pack Lot # [id number]  ? CATARACT EXTRACTION W/PHACO Right 06/10/2017  ? Procedure: CATARACT EXTRACTION PHACO AND INTRAOCULAR LENS PLACEMENT (IOC);  Surgeon: Birder Robson, MD;  Location: [facility] OR[facility];  Service: Ophthalmology;  Laterality: Right;  [place]   00:51 ?AP% 15.4 ?CDE 7.95 ?Fluid pack lot # [id number]  ? CHOLECYSTECTOMY    ? CYSTOSCOPY W/ RETROGRADES Right 08/13/2019  ? Procedure: CYSTOSCOPY WITH RETROGRADE PYELOGRAM;  Surgeon: Billey Co, MD;  Location: [facility] ORS;  Service: Urology;  Laterality: Right;  ? CYSTOSCOPY WITH STENT PLACEMENT Right 07/26/2019  ? Procedure: CYSTOSCOPY WITH STENT PLACEMENT;  Surgeon: Lucas Mallow, MD;  Location: [facility] OR[facility];  Service: Urology;  Laterality: Right;  ? CYSTOSCOPY/URETEROSCOPY/HOLMIUM LASER/STENT PLACEMENT Right 08/13/2019  ? Procedure: CYSTOSCOPY/URETEROSCOPY/HOLMIUM LASER/STENT EXCHANGE;  Surgeon: Billey Co, MD;  Location: [facility];  Service: Urology;  Laterality: Right;  ? FRACTURE SURGERY    ? WRIST  ? HAND SURGERY    ? HERNIA REPAIR    ? SHOULDER ARTHROSCOPY WITH SUBACROMIAL DECOMPRESSION AND OPEN ROTATOR C Right 09/11/2020  ? Procedure: Right shoulder arthroscopic rotator cuff repair vs Regeneten patch application, subacromial decompression, and biceps tenodesis - Reche Dixon to Assist;  Surgeon: Leim Fabry, MD;  Location: [facility] OR[facility];  Service: Orthopedics;  Laterality: Right;  ? TONSILLECTOMY    ?  ?Social History  ? ?Socioeconomic History  ? Marital status: Married  ?  Spouse name: Not on file  ? Number of children: Not on file  ? Years of education: Not on file  ? Highest education level: Not on file  ?Occupational History  ? Not on file  ?  Tobacco Use  ? Smoking status: Never  ? Smokeless tobacco: Never  ?Vaping Use  ? Vaping Use: Never used  ?Substance and Sexual Activity  ? Alcohol use: No  ? Drug use: Not Currently  ? Sexual activity: Yes  ?  Birth control/protection: None  ?Other Topics Concern  ? Not on file  ?Social History Narrative  ? Not on file  ? ?Social Determinants of Health  ? ?Financial Resource Strain: Not on file  ?Food Insecurity: Not on file  ?Transportation Needs: Not on file  ?Physical Activity: Not on file  ?Stress: Not on file  ?Social Connections: Not on file  ?Intimate Partner  Violence: Not on file  ?  ?Family History  ?Problem Relation Age of Onset  ? Emphysema Mother   ? COPD Mother   ? Heart disease Mother   ? Brain cancer Father   ? ?Allergies  ?Allergen Reactions  ? Codeine Nausea And Vomiting  ? Doxycycline   ? Erythromycin Rash  ? ?I? ?Current Outpatient Medications  ?Medication Sig Dispense Refill  ? albuterol (PROVENTIL HFA;VENTOLIN HFA) 108 (90 Base) MCG/ACT inhaler Inhale 2 puffs into the lungs every 6 (six) hours as needed for wheezing or shortness of breath.    ? amLODipine (NORVASC) 5 MG tablet Take 5 mg by mouth daily.    ? calcitRIOL (ROCALTROL) 0.25 MCG capsule Take 0.25 mcg by mouth daily.    ? furosemide (LASIX) 40 MG tablet Take 40 mg by mouth 3 (three) times a week.    ? GLOBAL INJECT EASE INSULIN SYR 31G X 5/16" 0.5 ML MISC Inject into the skin.    ? GLOBAL INJECT EASE INSULIN SYR 31G X 5/16" 1 ML MISC 615 mg.     ? glucose blood (ONETOUCH ULTRA) test strip TEST BLOOD SUGAR 4 TIMES DAILY    ? insulin aspart (NOVOLOG) 100 UNIT/ML injection Inject 30 units before breakfast and lunch. Inject 55 units before supper.    ? insulin detemir (LEVEMIR) 100 UNIT/ML injection Inject into the skin.    ? JARDIANCE 25 MG TABS tablet Take 25 mg by mouth daily.    ? ketoconazole (NIZORAL) 2 % cream Apply topically daily.    ? Lancets (ONETOUCH DELICA PLUS [id number]) MISC USE 1 LANCET 4 TIMES DAILY    ? lovastatin (MEVACOR) 40 MG tablet Take 40 mg by mouth daily with supper.    ? metoprolol succinate (TOPROL-XL) 50 MG 24 hr tablet Take 50 mg by mouth daily.    ? neomycin-bacitracin-polymyxin (NEOSPORIN) ointment Apply 1 application topically every 12 (twelve) hours.    ? potassium chloride SA (K-DUR,KLOR-CON) 20 MEQ tablet Take 20 mEq by mouth daily.    ? valsartan (DIOVAN) 320 MG tablet     ? Vitamins A & D (VITAMIN A & D) ointment Apply 1 application topically as needed for dry skin.    ? ?No current facility-administered medications for this visit.  ?  ? ?Abtx:  ?Anti-infectives  (From admission, onward)  ? ? None  ? ?  ? ? ?REVIEW OF SYSTEMS:  ?Const: negative fever, negative chills, negative weight loss ?Eyes: negative diplopia or visual changes, negative eye pain ?ENT: negative coryza, negative sore throat ?Resp: negative cough, hemoptysis, has dyspnea ?Cards: negative for chest pain, palpitations,has  lower extremity edema ?GU: as above ?GI: Negative for abdominal pain, diarrhea, bleeding, constipation ?Skin: negative for rash and pruritus ?Heme: negative for easy bruising and gum/nose bleeding ?MS: general weakness ?Neurolo:negative for headaches, dizziness, vertigo, memory problems  ?  Psych: negative for feelings of anxiety, depression  ?Endocrine: , diabetes ?Allergy/Immunology- as above ?Objective:  ?VITALS:  ?BP (!) 175/87   Pulse 75   Resp 16   Ht '5\' 10"'$  (1.778 m)   Wt (!) 330 lb (149.7 kg)   SpO2 98%   BMI 47.35 kg/m?  ?PHYSICAL EXAM:  ?General: Alert, cooperative, no distress, appears young for  age. Morbidly obese ?Head: Normocephalic, without obvious abnormality, atraumatic. ?Eyes: Conjunctivae clear, anicteric sclerae. Pupils are equal ?ENT Nares normal. No drainage or sinus tenderness. ?Lips, mucosa, and tongue normal. No Thrush ?Neck: Supple, symmetrical, no adenopathy, thyroid: non tender ?no carotid bruit and no JVD. ?Back: No CVA tenderness. ?Lungs: Clear to auscultation bilaterally. No Wheezing or Rhonchi. No rales. ?Heart: Regular rate and rhythm, no murmur, rub or gallop. ?Abdomen: Soft, obese ?Extremities: edema legs ?Rt shoulder abduction restricted ?But no swelling or erythema ?Skin: No rashes or lesions. Or bruising ?Lymph: Cervical, supraclavicular normal. ?Neurologic: Grossly non-focal ?Pertinent Labs ?Lab Results ?CBC ?   ?Component Value Date/Time  ? WBC 13.8 (H) 07/26/2019 1516  ? RBC 4.32 07/26/2019 1516  ? HGB 11.6 (L) 09/11/2020 0646  ? HGB 15.6 11/27/2013 0412  ? HCT 34.0 (L) 09/11/2020 0646  ? HCT 49.7 11/27/2013 0412  ? PLT 326 07/26/2019 1516  ?  PLT 343 11/27/2013 0412  ? MCV 89.6 07/26/2019 1516  ? MCV 88 11/27/2013 0412  ? MCH 28.7 07/26/2019 1516  ? MCHC 32.0 07/26/2019 1516  ? RDW 13.3 07/26/2019 1516  ? RDW 14.6 (H) 11/27/2013 0412  ? LYMPHSABS 1.1 05/09

## 2021-10-30 NOTE — Progress Notes (Signed)
? ?  10/30/2021 ?3:42 PM  ? ?James Clarke ?1940/12/27 ?[id number] ? ?Reason for visit: Bacteriuria, history nephrolithiasis ? ?HPI: ?I saw [name] as an urgent add-on in clinic today for possible retention.  He is an 81 year old very comorbid male with a BMI of 47, diabetes, CKD, and history of stones.  He apparently had a Enterococcus positive culture x2 with his nephrologist, which was treated with antibiotics despite not having any significant dysuria or other urinary symptoms aside from his baseline urinary frequency.  He drinks a fair amount of diet soda during the day which contributes to his urinary symptoms.  He has a history of nephrolithiasis treated with right ureteroscopy with me in January 2021, and cystoscopy was normal at that time except for a high bladder neck and cystoscopy limited by his morbid obesity.  He had a KUB as recently as August 2022 that showed no evidence of recurrent stones. ? ?It sounds like he has been told that he had multiple UTIs that contributed to his overall fatigue and low back pain and decreased energy.  He had no change in his baseline urinary frequency after 2 prolonged courses of amoxicillin for the Enterococcus found in the urine.  He again denies any dysuria today.  I suspect this is more asymptomatic bacteriuria and colonization in the setting of his numerous comorbidities and diabetes.  I do not think this is related to his urinary symptoms, and PVR is normal today at 16 mL.  His wife provides much of the history today.  I reviewed the outside notes from nephrology and infectious disease, and talked with Dr. Steva Ready this morning. ? ?I had a very frank conversation with the patient and his wife that I suspect his symptoms are related to his morbid obesity with BMI of 47 that is contributing to his low back pain and decreased mobility, as well as his other comorbidities and overall decline in health.  We discussed alternatives like ordering a CT for further  evaluation of his low back pain and fatigue, but based on the recent normal KUB and lack of renal colic I do not think nephrolithiasis would be an issue. ? ?-RTC 4 months with urology ?-Would not recommend treating asymptomatic bacteriuria with antibiotics, and do not feel this is the cause of his fatigue/decreased energy or low back pain ? ?Billey Co, MD ? ?[facility] ?[address], [address] ?[place], [place] [address] ?[phone] ? ? ?

## 2021-11-06 DIAGNOSIS — E1169 Type 2 diabetes mellitus with other specified complication: Secondary | ICD-10-CM | POA: Diagnosis not present

## 2021-11-06 DIAGNOSIS — R5383 Other fatigue: Secondary | ICD-10-CM | POA: Diagnosis not present

## 2021-11-06 DIAGNOSIS — E1121 Type 2 diabetes mellitus with diabetic nephropathy: Secondary | ICD-10-CM | POA: Diagnosis not present

## 2021-11-06 DIAGNOSIS — N184 Chronic kidney disease, stage 4 (severe): Secondary | ICD-10-CM | POA: Diagnosis not present

## 2021-11-06 DIAGNOSIS — E1122 Type 2 diabetes mellitus with diabetic chronic kidney disease: Secondary | ICD-10-CM | POA: Diagnosis not present

## 2021-11-06 DIAGNOSIS — Z794 Long term (current) use of insulin: Secondary | ICD-10-CM | POA: Diagnosis not present

## 2021-11-06 DIAGNOSIS — I10 Essential (primary) hypertension: Secondary | ICD-10-CM | POA: Diagnosis not present

## 2021-11-06 DIAGNOSIS — E1142 Type 2 diabetes mellitus with diabetic polyneuropathy: Secondary | ICD-10-CM | POA: Diagnosis not present

## 2021-11-06 DIAGNOSIS — E669 Obesity, unspecified: Secondary | ICD-10-CM | POA: Diagnosis not present

## 2021-11-06 DIAGNOSIS — K760 Fatty (change of) liver, not elsewhere classified: Secondary | ICD-10-CM | POA: Diagnosis not present

## 2021-11-19 DIAGNOSIS — Z794 Long term (current) use of insulin: Secondary | ICD-10-CM | POA: Diagnosis not present

## 2021-11-19 DIAGNOSIS — E1140 Type 2 diabetes mellitus with diabetic neuropathy, unspecified: Secondary | ICD-10-CM | POA: Diagnosis not present

## 2021-11-19 DIAGNOSIS — B351 Tinea unguium: Secondary | ICD-10-CM | POA: Diagnosis not present

## 2021-11-20 DIAGNOSIS — R0602 Shortness of breath: Secondary | ICD-10-CM | POA: Diagnosis not present

## 2021-11-20 DIAGNOSIS — I6523 Occlusion and stenosis of bilateral carotid arteries: Secondary | ICD-10-CM | POA: Diagnosis not present

## 2021-11-20 DIAGNOSIS — G4733 Obstructive sleep apnea (adult) (pediatric): Secondary | ICD-10-CM | POA: Diagnosis not present

## 2021-11-20 DIAGNOSIS — E7800 Pure hypercholesterolemia, unspecified: Secondary | ICD-10-CM | POA: Diagnosis not present

## 2021-11-20 DIAGNOSIS — I700 Atherosclerosis of aorta: Secondary | ICD-10-CM | POA: Diagnosis not present

## 2021-11-20 DIAGNOSIS — I10 Essential (primary) hypertension: Secondary | ICD-10-CM | POA: Diagnosis not present

## 2021-11-20 DIAGNOSIS — R42 Dizziness and giddiness: Secondary | ICD-10-CM | POA: Diagnosis not present

## 2021-11-26 DIAGNOSIS — I6523 Occlusion and stenosis of bilateral carotid arteries: Secondary | ICD-10-CM | POA: Diagnosis not present

## 2021-11-26 DIAGNOSIS — R0602 Shortness of breath: Secondary | ICD-10-CM | POA: Diagnosis not present

## 2021-11-26 DIAGNOSIS — R42 Dizziness and giddiness: Secondary | ICD-10-CM | POA: Diagnosis not present

## 2021-12-03 ENCOUNTER — Other Ambulatory Visit: Payer: Self-pay | Admitting: Student

## 2021-12-03 DIAGNOSIS — R0602 Shortness of breath: Secondary | ICD-10-CM

## 2021-12-05 ENCOUNTER — Encounter
Admission: RE | Admit: 2021-12-05 | Discharge: 2021-12-05 | Disposition: A | Discharge status: Home / Self Care | Payer: PPO | Source: Ambulatory Visit | Attending: Student | Admitting: Student

## 2021-12-05 DIAGNOSIS — R0602 Shortness of breath: Secondary | ICD-10-CM | POA: Insufficient documentation

## 2021-12-05 MED ORDER — TECHNETIUM TC 99M TETROFOSMIN IV KIT
30.3500 | PACK | Freq: Once | INTRAVENOUS | Status: AC | PRN
  Administered 2021-12-05: 30.35 via INTRAVENOUS

## 2021-12-05 MED ORDER — REGADENOSON 0.4 MG/5ML IV SOLN
0.4000 mg | Freq: Once | INTRAVENOUS | Status: AC
Start: 2021-12-05 — End: 2021-12-05
  Administered 2021-12-05: 0.4 mg via INTRAVENOUS

## 2021-12-06 ENCOUNTER — Encounter
Admission: RE | Admit: 2021-12-06 | Discharge: 2021-12-06 | Disposition: A | Discharge status: Home / Self Care | Payer: PPO | Source: Ambulatory Visit | Attending: Student | Admitting: Student

## 2021-12-06 ENCOUNTER — Other Ambulatory Visit: Payer: PPO

## 2021-12-06 MED ORDER — TECHNETIUM TC 99M TETROFOSMIN IV KIT
31.7300 | PACK | Freq: Once | INTRAVENOUS | Status: AC | PRN
  Administered 2021-12-06: 31.73 via INTRAVENOUS

## 2021-12-07 ENCOUNTER — Other Ambulatory Visit: Payer: PPO

## 2021-12-18 LAB — NM MYOCAR MULTI W/SPECT W/WALL MOTION / EF
Base ST Depression (mm): 0 mm
Estimated workload: 1
Exercise duration (min): 1 min
LV dias vol: 140 mL (ref 62–150)
LV sys vol: 60 mL
MPHR: 139 {beats}/min
Nuc Stress EF: 57 %
Peak HR: 90 {beats}/min
Percent HR: 64 %
Rest HR: 74 {beats}/min
Rest Nuclear Isotope Dose: 31.7 mCi
SDS: 4
SRS: 9
SSS: 15
ST Depression (mm): 0 mm
Stress Nuclear Isotope Dose: 30.4 mCi
TID: 0.91

## 2021-12-20 DIAGNOSIS — Z01818 Encounter for other preprocedural examination: Secondary | ICD-10-CM | POA: Diagnosis not present

## 2021-12-20 DIAGNOSIS — R600 Localized edema: Secondary | ICD-10-CM | POA: Diagnosis not present

## 2021-12-20 DIAGNOSIS — E1122 Type 2 diabetes mellitus with diabetic chronic kidney disease: Secondary | ICD-10-CM | POA: Diagnosis not present

## 2021-12-20 DIAGNOSIS — E7800 Pure hypercholesterolemia, unspecified: Secondary | ICD-10-CM | POA: Diagnosis not present

## 2021-12-20 DIAGNOSIS — N184 Chronic kidney disease, stage 4 (severe): Secondary | ICD-10-CM | POA: Diagnosis not present

## 2021-12-20 DIAGNOSIS — I10 Essential (primary) hypertension: Secondary | ICD-10-CM | POA: Diagnosis not present

## 2021-12-20 DIAGNOSIS — Z794 Long term (current) use of insulin: Secondary | ICD-10-CM | POA: Diagnosis not present

## 2021-12-20 DIAGNOSIS — I700 Atherosclerosis of aorta: Secondary | ICD-10-CM | POA: Diagnosis not present

## 2021-12-20 DIAGNOSIS — G4733 Obstructive sleep apnea (adult) (pediatric): Secondary | ICD-10-CM | POA: Diagnosis not present

## 2021-12-26 DIAGNOSIS — I2510 Atherosclerotic heart disease of native coronary artery without angina pectoris: Secondary | ICD-10-CM | POA: Diagnosis not present

## 2021-12-26 DIAGNOSIS — I739 Peripheral vascular disease, unspecified: Secondary | ICD-10-CM | POA: Diagnosis not present

## 2021-12-26 DIAGNOSIS — I509 Heart failure, unspecified: Secondary | ICD-10-CM | POA: Diagnosis not present

## 2021-12-26 DIAGNOSIS — I110 Hypertensive heart disease with heart failure: Secondary | ICD-10-CM | POA: Diagnosis not present

## 2021-12-26 DIAGNOSIS — Z794 Long term (current) use of insulin: Secondary | ICD-10-CM | POA: Diagnosis not present

## 2021-12-31 ENCOUNTER — Encounter
Admission: RE | Disposition: A | Discharge status: Home / Self Care | Payer: Self-pay | Source: Ambulatory Visit | Attending: Internal Medicine

## 2021-12-31 ENCOUNTER — Other Ambulatory Visit: Payer: Self-pay

## 2021-12-31 ENCOUNTER — Ambulatory Visit
Admission: RE | Admit: 2021-12-31 | Discharge: 2021-12-31 | Disposition: A | Discharge status: Home / Self Care | Payer: PPO | Source: Ambulatory Visit | Attending: Internal Medicine | Admitting: Internal Medicine

## 2021-12-31 DIAGNOSIS — Z6841 Body Mass Index (BMI) 40.0 and over, adult: Secondary | ICD-10-CM | POA: Diagnosis not present

## 2021-12-31 DIAGNOSIS — I2510 Atherosclerotic heart disease of native coronary artery without angina pectoris: Secondary | ICD-10-CM | POA: Insufficient documentation

## 2021-12-31 DIAGNOSIS — E119 Type 2 diabetes mellitus without complications: Secondary | ICD-10-CM | POA: Diagnosis not present

## 2021-12-31 DIAGNOSIS — R0602 Shortness of breath: Secondary | ICD-10-CM | POA: Diagnosis not present

## 2021-12-31 DIAGNOSIS — R9430 Abnormal result of cardiovascular function study, unspecified: Secondary | ICD-10-CM | POA: Diagnosis present

## 2021-12-31 DIAGNOSIS — E785 Hyperlipidemia, unspecified: Secondary | ICD-10-CM | POA: Insufficient documentation

## 2021-12-31 DIAGNOSIS — I10 Essential (primary) hypertension: Secondary | ICD-10-CM | POA: Diagnosis not present

## 2021-12-31 DIAGNOSIS — I25110 Atherosclerotic heart disease of native coronary artery with unstable angina pectoris: Secondary | ICD-10-CM | POA: Diagnosis not present

## 2021-12-31 HISTORY — PX: LEFT HEART CATH AND CORONARY ANGIOGRAPHY: CATH118249

## 2021-12-31 LAB — GLUCOSE, CAPILLARY
Glucose-Capillary: 230 mg/dL — ABNORMAL HIGH (ref 70–99)
Glucose-Capillary: 198 mg/dL — ABNORMAL HIGH (ref 70–99)

## 2021-12-31 SURGERY — LEFT HEART CATH AND CORONARY ANGIOGRAPHY
Anesthesia: Moderate Sedation

## 2021-12-31 MED ORDER — HEPARIN SODIUM (PORCINE) 1000 UNIT/ML IJ SOLN
INTRAMUSCULAR | Status: AC
  Filled 2021-12-31: qty 10

## 2021-12-31 MED ORDER — ASPIRIN 81 MG PO CHEW
81.0000 mg | CHEWABLE_TABLET | ORAL | Status: AC
Start: 2022-01-01 — End: 2021-12-31
  Administered 2021-12-31: 81 mg via ORAL

## 2021-12-31 MED ORDER — SODIUM CHLORIDE 0.9 % WEIGHT BASED INFUSION
3.0000 mL/kg/h | INTRAVENOUS | Status: AC
  Administered 2021-12-31: 3 mL/kg/h via INTRAVENOUS

## 2021-12-31 MED ORDER — SODIUM CHLORIDE 0.9% FLUSH: 3.0000 mL | Freq: Two times a day (BID) | INTRAVENOUS | Status: DC

## 2021-12-31 MED ORDER — LIDOCAINE HCL (PF) 1 % IJ SOLN
INTRAMUSCULAR | Status: DC | PRN
  Administered 2021-12-31: 15 mL
  Administered 2021-12-31: 2 mL

## 2021-12-31 MED ORDER — SODIUM CHLORIDE 0.9 % IV SOLN: 250.0000 mL | INTRAVENOUS | Status: DC | PRN

## 2021-12-31 MED ORDER — LABETALOL HCL 5 MG/ML IV SOLN: 10.0000 mg | INTRAVENOUS | Status: DC | PRN

## 2021-12-31 MED ORDER — VERAPAMIL HCL 2.5 MG/ML IV SOLN
INTRAVENOUS | Status: AC
  Filled 2021-12-31: qty 2

## 2021-12-31 MED ORDER — ACETAMINOPHEN 325 MG PO TABS: 650.0000 mg | ORAL_TABLET | ORAL | Status: DC | PRN

## 2021-12-31 MED ORDER — SODIUM CHLORIDE 0.9% FLUSH: 3.0000 mL | INTRAVENOUS | Status: DC | PRN

## 2021-12-31 MED ORDER — LIDOCAINE HCL 1 % IJ SOLN
INTRAMUSCULAR | Status: AC
  Filled 2021-12-31: qty 20

## 2021-12-31 MED ORDER — MIDAZOLAM HCL 2 MG/2ML IJ SOLN
INTRAMUSCULAR | Status: DC | PRN
  Administered 2021-12-31: 1 mg via INTRAVENOUS

## 2021-12-31 MED ORDER — HEPARIN (PORCINE) IN NACL 1000-0.9 UT/500ML-% IV SOLN
INTRAVENOUS | Status: DC | PRN
  Administered 2021-12-31: 1000 mL

## 2021-12-31 MED ORDER — ONDANSETRON HCL 4 MG/2ML IJ SOLN: 4.0000 mg | Freq: Four times a day (QID) | INTRAMUSCULAR | Status: DC | PRN

## 2021-12-31 MED ORDER — HYDRALAZINE HCL 20 MG/ML IJ SOLN: 10.0000 mg | INTRAMUSCULAR | Status: DC | PRN

## 2021-12-31 MED ORDER — HEPARIN (PORCINE) IN NACL 1000-0.9 UT/500ML-% IV SOLN
INTRAVENOUS | Status: AC
  Filled 2021-12-31: qty 1000

## 2021-12-31 MED ORDER — ASPIRIN 81 MG PO CHEW
CHEWABLE_TABLET | ORAL | Status: AC
  Filled 2021-12-31: qty 1

## 2021-12-31 MED ORDER — MIDAZOLAM HCL 2 MG/2ML IJ SOLN
INTRAMUSCULAR | Status: AC
  Filled 2021-12-31: qty 2

## 2021-12-31 MED ORDER — FENTANYL CITRATE (PF) 100 MCG/2ML IJ SOLN
INTRAMUSCULAR | Status: DC | PRN
  Administered 2021-12-31: 25 ug via INTRAVENOUS

## 2021-12-31 MED ORDER — FENTANYL CITRATE (PF) 100 MCG/2ML IJ SOLN
INTRAMUSCULAR | Status: AC
  Filled 2021-12-31: qty 2

## 2021-12-31 MED ORDER — SODIUM CHLORIDE 0.9 % WEIGHT BASED INFUSION: 1.0000 mL/kg/h | INTRAVENOUS | Status: DC

## 2021-12-31 MED ORDER — SODIUM CHLORIDE 0.9 % WEIGHT BASED INFUSION
1.0000 mL/kg/h | INTRAVENOUS | Status: DC
  Administered 2021-12-31: 1 mL/kg/h via INTRAVENOUS

## 2021-12-31 MED ORDER — IOHEXOL 300 MG/ML  SOLN
INTRAMUSCULAR | Status: DC | PRN
  Administered 2021-12-31: 75 mL

## 2021-12-31 SURGICAL SUPPLY — 15 items
BAND ZEPHYR COMPRESS 30 LONG (HEMOSTASIS) ×1 IMPLANT
CATH INFINITI 5FR MULTPACK ANG (CATHETERS) ×1 IMPLANT
DEVICE CLOSURE MYNXGRIP 5F (Vascular Products) ×1 IMPLANT
DRAPE BRACHIAL (DRAPES) ×1 IMPLANT
GLIDESHEATH SLEND SS 6F .021 (SHEATH) ×1 IMPLANT
GUIDEWIRE INQWIRE 1.5J.035X260 (WIRE) IMPLANT
INQWIRE 1.5J .035X260CM (WIRE) ×2
NDL PERC 18GX7CM (NEEDLE) IMPLANT
NEEDLE PERC 18GX7CM (NEEDLE) ×2 IMPLANT
PACK CARDIAC CATH (CUSTOM PROCEDURE TRAY) ×2 IMPLANT
PROTECTION STATION PRESSURIZED (MISCELLANEOUS) ×2
SET ATX SIMPLICITY (MISCELLANEOUS) ×1 IMPLANT
SHEATH AVANTI 5FR X 11CM (SHEATH) ×1 IMPLANT
STATION PROTECTION PRESSURIZED (MISCELLANEOUS) IMPLANT
WIRE GUIDERIGHT .035X150 (WIRE) ×1 IMPLANT

## 2022-01-01 ENCOUNTER — Encounter: Payer: Self-pay | Admitting: Internal Medicine

## 2022-01-09 DIAGNOSIS — B356 Tinea cruris: Secondary | ICD-10-CM | POA: Diagnosis not present

## 2022-01-09 DIAGNOSIS — Z872 Personal history of diseases of the skin and subcutaneous tissue: Secondary | ICD-10-CM | POA: Diagnosis not present

## 2022-01-09 DIAGNOSIS — L918 Other hypertrophic disorders of the skin: Secondary | ICD-10-CM | POA: Diagnosis not present

## 2022-01-09 DIAGNOSIS — L578 Other skin changes due to chronic exposure to nonionizing radiation: Secondary | ICD-10-CM | POA: Diagnosis not present

## 2022-01-09 DIAGNOSIS — L570 Actinic keratosis: Secondary | ICD-10-CM | POA: Diagnosis not present

## 2022-01-09 DIAGNOSIS — Z86018 Personal history of other benign neoplasm: Secondary | ICD-10-CM | POA: Diagnosis not present

## 2022-01-10 DIAGNOSIS — I10 Essential (primary) hypertension: Secondary | ICD-10-CM | POA: Diagnosis not present

## 2022-01-10 DIAGNOSIS — R600 Localized edema: Secondary | ICD-10-CM | POA: Diagnosis not present

## 2022-01-10 DIAGNOSIS — I2510 Atherosclerotic heart disease of native coronary artery without angina pectoris: Secondary | ICD-10-CM | POA: Insufficient documentation

## 2022-01-10 DIAGNOSIS — I700 Atherosclerosis of aorta: Secondary | ICD-10-CM | POA: Diagnosis not present

## 2022-01-10 DIAGNOSIS — N184 Chronic kidney disease, stage 4 (severe): Secondary | ICD-10-CM | POA: Diagnosis not present

## 2022-01-17 DIAGNOSIS — N184 Chronic kidney disease, stage 4 (severe): Secondary | ICD-10-CM | POA: Diagnosis not present

## 2022-01-17 DIAGNOSIS — E1122 Type 2 diabetes mellitus with diabetic chronic kidney disease: Secondary | ICD-10-CM | POA: Diagnosis not present

## 2022-01-23 DIAGNOSIS — I10 Essential (primary) hypertension: Secondary | ICD-10-CM | POA: Diagnosis not present

## 2022-01-23 DIAGNOSIS — E1122 Type 2 diabetes mellitus with diabetic chronic kidney disease: Secondary | ICD-10-CM | POA: Diagnosis not present

## 2022-01-23 DIAGNOSIS — N184 Chronic kidney disease, stage 4 (severe): Secondary | ICD-10-CM | POA: Diagnosis not present

## 2022-01-23 DIAGNOSIS — G4733 Obstructive sleep apnea (adult) (pediatric): Secondary | ICD-10-CM | POA: Diagnosis not present

## 2022-01-23 DIAGNOSIS — N2581 Secondary hyperparathyroidism of renal origin: Secondary | ICD-10-CM | POA: Diagnosis not present

## 2022-01-23 DIAGNOSIS — D631 Anemia in chronic kidney disease: Secondary | ICD-10-CM | POA: Diagnosis not present

## 2022-01-23 DIAGNOSIS — R809 Proteinuria, unspecified: Secondary | ICD-10-CM | POA: Diagnosis not present

## 2022-02-13 DIAGNOSIS — Z794 Long term (current) use of insulin: Secondary | ICD-10-CM | POA: Diagnosis not present

## 2022-02-13 DIAGNOSIS — E1121 Type 2 diabetes mellitus with diabetic nephropathy: Secondary | ICD-10-CM | POA: Diagnosis not present

## 2022-02-19 DIAGNOSIS — E1169 Type 2 diabetes mellitus with other specified complication: Secondary | ICD-10-CM | POA: Diagnosis not present

## 2022-02-19 DIAGNOSIS — I10 Essential (primary) hypertension: Secondary | ICD-10-CM | POA: Diagnosis not present

## 2022-02-19 DIAGNOSIS — E1121 Type 2 diabetes mellitus with diabetic nephropathy: Secondary | ICD-10-CM | POA: Diagnosis not present

## 2022-02-19 DIAGNOSIS — E669 Obesity, unspecified: Secondary | ICD-10-CM | POA: Diagnosis not present

## 2022-02-19 DIAGNOSIS — N184 Chronic kidney disease, stage 4 (severe): Secondary | ICD-10-CM | POA: Diagnosis not present

## 2022-02-19 DIAGNOSIS — Z794 Long term (current) use of insulin: Secondary | ICD-10-CM | POA: Diagnosis not present

## 2022-02-19 DIAGNOSIS — E1122 Type 2 diabetes mellitus with diabetic chronic kidney disease: Secondary | ICD-10-CM | POA: Diagnosis not present

## 2022-02-19 DIAGNOSIS — E1142 Type 2 diabetes mellitus with diabetic polyneuropathy: Secondary | ICD-10-CM | POA: Diagnosis not present

## 2022-02-20 DIAGNOSIS — B351 Tinea unguium: Secondary | ICD-10-CM | POA: Diagnosis not present

## 2022-02-20 DIAGNOSIS — E1140 Type 2 diabetes mellitus with diabetic neuropathy, unspecified: Secondary | ICD-10-CM | POA: Diagnosis not present

## 2022-02-20 DIAGNOSIS — Z794 Long term (current) use of insulin: Secondary | ICD-10-CM | POA: Diagnosis not present

## 2022-02-22 ENCOUNTER — Telehealth: Payer: Self-pay

## 2022-02-22 DIAGNOSIS — Z9359 Other cystostomy status: Secondary | ICD-10-CM | POA: Diagnosis not present

## 2022-02-22 DIAGNOSIS — R809 Proteinuria, unspecified: Secondary | ICD-10-CM | POA: Diagnosis not present

## 2022-02-22 NOTE — Telephone Encounter (Signed)
Pt called office complaining of dysuria. I offered pt an appointment on Monday as pt declined. Pt asked if he can just drop a urine off at the lab, I advised pt that is not our protocol. I offered pt an appointment again as he declined.

## 2022-02-25 ENCOUNTER — Emergency Department: Payer: PPO

## 2022-02-25 ENCOUNTER — Other Ambulatory Visit: Payer: Self-pay

## 2022-02-25 ENCOUNTER — Ambulatory Visit (INDEPENDENT_AMBULATORY_CARE_PROVIDER_SITE_OTHER): Payer: PPO

## 2022-02-25 ENCOUNTER — Encounter: Payer: Self-pay | Admitting: Emergency Medicine

## 2022-02-25 ENCOUNTER — Ambulatory Visit (INDEPENDENT_AMBULATORY_CARE_PROVIDER_SITE_OTHER)
Admission: EM | Admit: 2022-02-25 | Discharge: 2022-02-25 | Disposition: A | Discharge status: Other Acute Inpatient Hospital | Payer: PPO | Source: Home / Self Care

## 2022-02-25 ENCOUNTER — Inpatient Hospital Stay
Admission: EM | Admit: 2022-02-25 | Discharge: 2022-03-11 | DRG: 660 | Disposition: A | Discharge status: Home Health | Payer: PPO | Attending: Internal Medicine | Admitting: Internal Medicine

## 2022-02-25 DIAGNOSIS — Z7982 Long term (current) use of aspirin: Secondary | ICD-10-CM

## 2022-02-25 DIAGNOSIS — E8809 Other disorders of plasma-protein metabolism, not elsewhere classified: Secondary | ICD-10-CM | POA: Diagnosis present

## 2022-02-25 DIAGNOSIS — R109 Unspecified abdominal pain: Secondary | ICD-10-CM | POA: Diagnosis present

## 2022-02-25 DIAGNOSIS — R1030 Lower abdominal pain, unspecified: Secondary | ICD-10-CM | POA: Diagnosis not present

## 2022-02-25 DIAGNOSIS — J45909 Unspecified asthma, uncomplicated: Secondary | ICD-10-CM | POA: Diagnosis present

## 2022-02-25 DIAGNOSIS — G4730 Sleep apnea, unspecified: Secondary | ICD-10-CM | POA: Diagnosis present

## 2022-02-25 DIAGNOSIS — N3289 Other specified disorders of bladder: Secondary | ICD-10-CM | POA: Diagnosis present

## 2022-02-25 DIAGNOSIS — R34 Anuria and oliguria: Secondary | ICD-10-CM | POA: Diagnosis present

## 2022-02-25 DIAGNOSIS — Z885 Allergy status to narcotic agent status: Secondary | ICD-10-CM

## 2022-02-25 DIAGNOSIS — N189 Chronic kidney disease, unspecified: Secondary | ICD-10-CM | POA: Diagnosis not present

## 2022-02-25 DIAGNOSIS — Z6841 Body Mass Index (BMI) 40.0 and over, adult: Secondary | ICD-10-CM | POA: Diagnosis not present

## 2022-02-25 DIAGNOSIS — Z961 Presence of intraocular lens: Secondary | ICD-10-CM | POA: Diagnosis present

## 2022-02-25 DIAGNOSIS — Z87442 Personal history of urinary calculi: Secondary | ICD-10-CM | POA: Diagnosis not present

## 2022-02-25 DIAGNOSIS — E1165 Type 2 diabetes mellitus with hyperglycemia: Secondary | ICD-10-CM | POA: Diagnosis present

## 2022-02-25 DIAGNOSIS — B379 Candidiasis, unspecified: Secondary | ICD-10-CM | POA: Diagnosis present

## 2022-02-25 DIAGNOSIS — R3911 Hesitancy of micturition: Secondary | ICD-10-CM | POA: Diagnosis present

## 2022-02-25 DIAGNOSIS — E1122 Type 2 diabetes mellitus with diabetic chronic kidney disease: Secondary | ICD-10-CM | POA: Diagnosis present

## 2022-02-25 DIAGNOSIS — R1084 Generalized abdominal pain: Secondary | ICD-10-CM | POA: Diagnosis not present

## 2022-02-25 DIAGNOSIS — N201 Calculus of ureter: Secondary | ICD-10-CM | POA: Diagnosis not present

## 2022-02-25 DIAGNOSIS — N184 Chronic kidney disease, stage 4 (severe): Secondary | ICD-10-CM | POA: Diagnosis not present

## 2022-02-25 DIAGNOSIS — R238 Other skin changes: Secondary | ICD-10-CM | POA: Diagnosis present

## 2022-02-25 DIAGNOSIS — R102 Pelvic and perineal pain: Secondary | ICD-10-CM

## 2022-02-25 DIAGNOSIS — Z9841 Cataract extraction status, right eye: Secondary | ICD-10-CM

## 2022-02-25 DIAGNOSIS — Z794 Long term (current) use of insulin: Secondary | ICD-10-CM

## 2022-02-25 DIAGNOSIS — Z79899 Other long term (current) drug therapy: Secondary | ICD-10-CM

## 2022-02-25 DIAGNOSIS — N1330 Unspecified hydronephrosis: Secondary | ICD-10-CM | POA: Diagnosis not present

## 2022-02-25 DIAGNOSIS — I130 Hypertensive heart and chronic kidney disease with heart failure and stage 1 through stage 4 chronic kidney disease, or unspecified chronic kidney disease: Secondary | ICD-10-CM | POA: Diagnosis present

## 2022-02-25 DIAGNOSIS — D631 Anemia in chronic kidney disease: Secondary | ICD-10-CM | POA: Diagnosis present

## 2022-02-25 DIAGNOSIS — Z9049 Acquired absence of other specified parts of digestive tract: Secondary | ICD-10-CM

## 2022-02-25 DIAGNOSIS — Z9889 Other specified postprocedural states: Secondary | ICD-10-CM | POA: Diagnosis not present

## 2022-02-25 DIAGNOSIS — I5032 Chronic diastolic (congestive) heart failure: Secondary | ICD-10-CM | POA: Diagnosis not present

## 2022-02-25 DIAGNOSIS — R300 Dysuria: Secondary | ICD-10-CM | POA: Diagnosis not present

## 2022-02-25 DIAGNOSIS — R600 Localized edema: Secondary | ICD-10-CM | POA: Diagnosis present

## 2022-02-25 DIAGNOSIS — N2889 Other specified disorders of kidney and ureter: Secondary | ICD-10-CM | POA: Diagnosis not present

## 2022-02-25 DIAGNOSIS — R609 Edema, unspecified: Secondary | ICD-10-CM | POA: Diagnosis not present

## 2022-02-25 DIAGNOSIS — N179 Acute kidney failure, unspecified: Secondary | ICD-10-CM | POA: Insufficient documentation

## 2022-02-25 DIAGNOSIS — N281 Cyst of kidney, acquired: Secondary | ICD-10-CM | POA: Diagnosis not present

## 2022-02-25 DIAGNOSIS — E1142 Type 2 diabetes mellitus with diabetic polyneuropathy: Secondary | ICD-10-CM | POA: Diagnosis present

## 2022-02-25 DIAGNOSIS — N1832 Chronic kidney disease, stage 3b: Secondary | ICD-10-CM

## 2022-02-25 DIAGNOSIS — Z8249 Family history of ischemic heart disease and other diseases of the circulatory system: Secondary | ICD-10-CM | POA: Diagnosis not present

## 2022-02-25 DIAGNOSIS — Z515 Encounter for palliative care: Secondary | ICD-10-CM | POA: Diagnosis not present

## 2022-02-25 DIAGNOSIS — Z881 Allergy status to other antibiotic agents status: Secondary | ICD-10-CM

## 2022-02-25 DIAGNOSIS — G4733 Obstructive sleep apnea (adult) (pediatric): Secondary | ICD-10-CM | POA: Diagnosis present

## 2022-02-25 DIAGNOSIS — Z992 Dependence on renal dialysis: Secondary | ICD-10-CM | POA: Diagnosis not present

## 2022-02-25 DIAGNOSIS — N185 Chronic kidney disease, stage 5: Secondary | ICD-10-CM | POA: Diagnosis not present

## 2022-02-25 DIAGNOSIS — F419 Anxiety disorder, unspecified: Secondary | ICD-10-CM | POA: Diagnosis present

## 2022-02-25 DIAGNOSIS — I5022 Chronic systolic (congestive) heart failure: Secondary | ICD-10-CM | POA: Diagnosis present

## 2022-02-25 DIAGNOSIS — N139 Obstructive and reflux uropathy, unspecified: Secondary | ICD-10-CM | POA: Diagnosis present

## 2022-02-25 DIAGNOSIS — I120 Hypertensive chronic kidney disease with stage 5 chronic kidney disease or end stage renal disease: Secondary | ICD-10-CM | POA: Diagnosis not present

## 2022-02-25 DIAGNOSIS — K5900 Constipation, unspecified: Secondary | ICD-10-CM | POA: Diagnosis present

## 2022-02-25 DIAGNOSIS — I10 Essential (primary) hypertension: Secondary | ICD-10-CM | POA: Diagnosis present

## 2022-02-25 DIAGNOSIS — N136 Pyonephrosis: Secondary | ICD-10-CM | POA: Diagnosis present

## 2022-02-25 DIAGNOSIS — N390 Urinary tract infection, site not specified: Secondary | ICD-10-CM | POA: Diagnosis present

## 2022-02-25 DIAGNOSIS — R159 Full incontinence of feces: Secondary | ICD-10-CM | POA: Diagnosis present

## 2022-02-25 DIAGNOSIS — E785 Hyperlipidemia, unspecified: Secondary | ICD-10-CM | POA: Diagnosis present

## 2022-02-25 DIAGNOSIS — Z825 Family history of asthma and other chronic lower respiratory diseases: Secondary | ICD-10-CM

## 2022-02-25 DIAGNOSIS — R197 Diarrhea, unspecified: Secondary | ICD-10-CM

## 2022-02-25 DIAGNOSIS — E66813 Obesity, class 3: Secondary | ICD-10-CM | POA: Diagnosis present

## 2022-02-25 DIAGNOSIS — Z9842 Cataract extraction status, left eye: Secondary | ICD-10-CM

## 2022-02-25 DIAGNOSIS — E1129 Type 2 diabetes mellitus with other diabetic kidney complication: Secondary | ICD-10-CM | POA: Diagnosis present

## 2022-02-25 DIAGNOSIS — D649 Anemia, unspecified: Secondary | ICD-10-CM | POA: Diagnosis not present

## 2022-02-25 DIAGNOSIS — I129 Hypertensive chronic kidney disease with stage 1 through stage 4 chronic kidney disease, or unspecified chronic kidney disease: Secondary | ICD-10-CM | POA: Diagnosis not present

## 2022-02-25 LAB — CBC WITH DIFFERENTIAL/PLATELET
Abs Immature Granulocytes: 0.05 10*3/uL (ref 0.00–0.07)
Basophils Absolute: 0 10*3/uL (ref 0.0–0.1)
Basophils Relative: 0 %
Eosinophils Absolute: 0.2 10*3/uL (ref 0.0–0.5)
Eosinophils Relative: 1 %
HCT: 36.2 % — ABNORMAL LOW (ref 39.0–52.0)
Hemoglobin: 11.3 g/dL — ABNORMAL LOW (ref 13.0–17.0)
Immature Granulocytes: 0 %
Lymphocytes Relative: 16 %
Lymphs Abs: 2.1 10*3/uL (ref 0.7–4.0)
MCH: 28.5 pg (ref 26.0–34.0)
MCHC: 31.2 g/dL (ref 30.0–36.0)
MCV: 91.4 fL (ref 80.0–100.0)
Monocytes Absolute: 1 10*3/uL (ref 0.1–1.0)
Monocytes Relative: 8 %
Neutro Abs: 9.9 10*3/uL — ABNORMAL HIGH (ref 1.7–7.7)
Neutrophils Relative %: 75 %
Platelets: 290 10*3/uL (ref 150–400)
RBC: 3.96 MIL/uL — ABNORMAL LOW (ref 4.22–5.81)
RDW: 14 % (ref 11.5–15.5)
WBC: 13.3 10*3/uL — ABNORMAL HIGH (ref 4.0–10.5)
nRBC: 0 % (ref 0.0–0.2)

## 2022-02-25 LAB — COMPREHENSIVE METABOLIC PANEL
ALT: 14 U/L (ref 0–44)
AST: 19 U/L (ref 15–41)
Albumin: 2.8 g/dL — ABNORMAL LOW (ref 3.5–5.0)
Alkaline Phosphatase: 71 U/L (ref 38–126)
Anion gap: 7 (ref 5–15)
BUN: 80 mg/dL — ABNORMAL HIGH (ref 8–23)
CO2: 23 mmol/L (ref 22–32)
Calcium: 8.4 mg/dL — ABNORMAL LOW (ref 8.9–10.3)
Chloride: 105 mmol/L (ref 98–111)
Creatinine, Ser: 5.61 mg/dL — ABNORMAL HIGH (ref 0.61–1.24)
GFR, Estimated: 10 mL/min — ABNORMAL LOW (ref >60)
Glucose, Bld: 118 mg/dL — ABNORMAL HIGH (ref 70–99)
Potassium: 3.9 mmol/L (ref 3.5–5.1)
Sodium: 135 mmol/L (ref 135–145)
Total Bilirubin: 0.5 mg/dL (ref 0.3–1.2)
Total Protein: 7.2 g/dL (ref 6.5–8.1)

## 2022-02-25 LAB — TSH: TSH: 4.013 u[IU]/mL (ref 0.350–4.500)

## 2022-02-25 LAB — PHOSPHORUS: Phosphorus: 5.4 mg/dL — ABNORMAL HIGH (ref 2.5–4.6)

## 2022-02-25 LAB — URINALYSIS, MICROSCOPIC (REFLEX): Bacteria, UA: NONE SEEN

## 2022-02-25 LAB — URINALYSIS, ROUTINE W REFLEX MICROSCOPIC
Bilirubin Urine: NEGATIVE
Glucose, UA: 250 mg/dL — AB
Ketones, ur: NEGATIVE mg/dL
Leukocytes,Ua: NEGATIVE
Nitrite: NEGATIVE
Protein, ur: >300 mg/dL — AB
Specific Gravity, Urine: 1.02 (ref 1.005–1.030)
pH: 5.5 (ref 5.0–8.0)

## 2022-02-25 LAB — CBG MONITORING, ED
Glucose-Capillary: 43 mg/dL — CL (ref 70–99)
Glucose-Capillary: 96 mg/dL (ref 70–99)
Glucose-Capillary: 215 mg/dL — ABNORMAL HIGH (ref 70–99)

## 2022-02-25 LAB — BRAIN NATRIURETIC PEPTIDE: B Natriuretic Peptide: 401.9 pg/mL — ABNORMAL HIGH (ref 0.0–100.0)

## 2022-02-25 LAB — MAGNESIUM: Magnesium: 2.2 mg/dL (ref 1.7–2.4)

## 2022-02-25 LAB — T4, FREE: Free T4: 0.66 ng/dL (ref 0.61–1.12)

## 2022-02-25 MED ORDER — HEPARIN SODIUM (PORCINE) 5000 UNIT/ML IJ SOLN
5000.0000 [IU] | Freq: Three times a day (TID) | INTRAMUSCULAR | Status: DC
  Administered 2022-02-25 – 2022-03-11 (×41): 5000 [IU] via SUBCUTANEOUS
  Filled 2022-02-25 (×41): qty 1

## 2022-02-25 MED ORDER — ALBUTEROL SULFATE (2.5 MG/3ML) 0.083% IN NEBU: 2.5000 mg | INHALATION_SOLUTION | Freq: Four times a day (QID) | RESPIRATORY_TRACT | Status: DC | PRN

## 2022-02-25 MED ORDER — HYDROCODONE-ACETAMINOPHEN 5-325 MG PO TABS: 1.0000 | ORAL_TABLET | ORAL | Status: DC | PRN

## 2022-02-25 MED ORDER — AMLODIPINE BESYLATE 5 MG PO TABS
5.0000 mg | ORAL_TABLET | Freq: Every day | ORAL | Status: DC
  Administered 2022-02-25 – 2022-02-26 (×2): 5 mg via ORAL
  Filled 2022-02-25 (×2): qty 1

## 2022-02-25 MED ORDER — ACETAMINOPHEN 325 MG PO TABS: 650.0000 mg | ORAL_TABLET | Freq: Four times a day (QID) | ORAL | Status: DC | PRN

## 2022-02-25 MED ORDER — HYDRALAZINE HCL 20 MG/ML IJ SOLN: 10.0000 mg | INTRAMUSCULAR | Status: DC | PRN

## 2022-02-25 MED ORDER — ASPIRIN 81 MG PO TBEC
81.0000 mg | DELAYED_RELEASE_TABLET | Freq: Every day | ORAL | Status: DC
  Administered 2022-02-26 – 2022-03-11 (×14): 81 mg via ORAL
  Filled 2022-02-25 (×14): qty 1

## 2022-02-25 MED ORDER — SODIUM CHLORIDE 0.9 % IV SOLN: INTRAVENOUS | Status: AC

## 2022-02-25 MED ORDER — ACETAMINOPHEN 650 MG RE SUPP
650.0000 mg | Freq: Four times a day (QID) | RECTAL | Status: DC | PRN
Start: 2022-02-25 — End: 2022-03-11

## 2022-02-25 MED ORDER — SODIUM CHLORIDE 0.9% FLUSH
3.0000 mL | Freq: Two times a day (BID) | INTRAVENOUS | Status: DC
  Administered 2022-02-25 – 2022-03-11 (×27): 3 mL via INTRAVENOUS

## 2022-02-25 MED ORDER — INSULIN ASPART 100 UNIT/ML IJ SOLN
0.0000 [IU] | Freq: Three times a day (TID) | INTRAMUSCULAR | Status: DC
  Administered 2022-02-26: 3 [IU] via SUBCUTANEOUS
  Administered 2022-02-26 (×2): 8 [IU] via SUBCUTANEOUS
  Administered 2022-02-27: 5 [IU] via SUBCUTANEOUS
  Administered 2022-02-27: 3 [IU] via SUBCUTANEOUS
  Administered 2022-02-27 – 2022-02-28 (×3): 8 [IU] via SUBCUTANEOUS
  Administered 2022-02-28: 5 [IU] via SUBCUTANEOUS
  Administered 2022-03-01: 3 [IU] via SUBCUTANEOUS
  Administered 2022-03-01: 8 [IU] via SUBCUTANEOUS
  Administered 2022-03-01: 2 [IU] via SUBCUTANEOUS
  Administered 2022-03-02: 3 [IU] via SUBCUTANEOUS
  Administered 2022-03-02: 2 [IU] via SUBCUTANEOUS
  Administered 2022-03-02: 11 [IU] via SUBCUTANEOUS
  Administered 2022-03-03: 2 [IU] via SUBCUTANEOUS
  Administered 2022-03-03: 11 [IU] via SUBCUTANEOUS
  Administered 2022-03-03 – 2022-03-04 (×3): 5 [IU] via SUBCUTANEOUS
  Administered 2022-03-04 – 2022-03-05 (×2): 8 [IU] via SUBCUTANEOUS
  Administered 2022-03-05: 3 [IU] via SUBCUTANEOUS
  Administered 2022-03-06: 8 [IU] via SUBCUTANEOUS
  Administered 2022-03-06 – 2022-03-07 (×3): 3 [IU] via SUBCUTANEOUS
  Administered 2022-03-07: 2 [IU] via SUBCUTANEOUS
  Administered 2022-03-08: 5 [IU] via SUBCUTANEOUS
  Administered 2022-03-08 – 2022-03-09 (×2): 8 [IU] via SUBCUTANEOUS
  Administered 2022-03-09 – 2022-03-10 (×2): 5 [IU] via SUBCUTANEOUS
  Administered 2022-03-10: 3 [IU] via SUBCUTANEOUS
  Administered 2022-03-10: 11 [IU] via SUBCUTANEOUS
  Administered 2022-03-11: 5 [IU] via SUBCUTANEOUS
  Administered 2022-03-11: 15 [IU] via SUBCUTANEOUS
  Filled 2022-02-25 (×38): qty 1

## 2022-02-25 MED ORDER — METOPROLOL SUCCINATE ER 50 MG PO TB24
50.0000 mg | ORAL_TABLET | Freq: Every day | ORAL | Status: DC
  Administered 2022-02-26 – 2022-03-11 (×14): 50 mg via ORAL
  Filled 2022-02-25 (×14): qty 1

## 2022-02-25 NOTE — ED Notes (Addendum)
Spoke with Dr. Holley Raring -- He recommended patient come to the ED due to worsening Kidney function.

## 2022-02-25 NOTE — Discharge Instructions (Addendum)
Urinalysis is negative for bateria  Your renal(kidney) function has worsened  You are advised to go to the ER. Per Dr Holley Raring for

## 2022-02-25 NOTE — ED Triage Notes (Signed)
Pt to ED via POV from urgent care. Pt went in for abdominal pain and back pain. Pt was told he had AKI and was told to come to the ED for evaluation. Pt had blood work and x-ray at urgent care. Pt states that Dr. Holley Raring is aware he is coming.

## 2022-02-25 NOTE — ED Provider Notes (Signed)
[facility] Provider Note   Event Date/Time   First MD Initiated Contact with Patient 02/25/22 1302     (approximate) History  Abdominal Pain and Back Pain  HPI James Clarke is a 81 y.o. male with a history of chronic kidney disease who presents from his primary care 94 office where he presented for suprapubic and bilateral flank pain due to worsening kidney function.  Patient's nephrologist, Dr. Holley Raring, was informed prior to his arrival in our emergency department.  Patient does endorse adequate p.o. intake especially of fluids but states that he has had decreased urine output over the past 3 days. ROS: Patient currently denies any vision changes, tinnitus, difficulty speaking, facial droop, sore throat, chest pain, shortness of breath, nausea/vomiting/diarrhea, dysuria, or weakness/numbness/paresthesias in any extremity   Physical Exam  Triage Vital Signs: ED Triage Vitals  Enc Vitals Group     BP 02/25/22 1257 (!) 167/75     Pulse Rate 02/25/22 1257 75     Resp 02/25/22 1257 16     Temp 02/25/22 1257 98.3 F (36.8 C)     Temp Source 02/25/22 1257 Oral     SpO2 02/25/22 1257 98 %     Weight 02/25/22 1256 (!) 328 lb 7.8 oz (149 kg)     Height 02/25/22 1256 '5\' 10"'$  (1.778 m)     Head Circumference --      Peak Flow --      Pain Score 02/25/22 1256 8     Pain Loc --      Pain Edu? --      Excl. in [place]? --    Most recent vital signs: Vitals:   02/25/22 1257 02/25/22 1455  BP: (!) 167/75 (!) 155/70  Pulse: 75 77  Resp: 16 17  Temp: 98.3 F (36.8 C) 98.7 F (37.1 C)  SpO2: 98% 95%   General: Awake, oriented x4. CV:  Good peripheral perfusion.  Resp:  Normal effort.  Abd:  No distention.  Other:  Morbidly obese elderly Caucasian male laying in bed in no acute distress ED Results / Procedures / Treatments  Labs (all labs ordered are listed, but only abnormal results are displayed) Labs Reviewed  CBC WITH DIFFERENTIAL/PLATELET -  Abnormal; Notable for the following components:      Result Value   WBC 13.3 (*)    RBC 3.96 (*)    Hemoglobin 11.3 (*)    HCT 36.2 (*)    Neutro Abs 9.9 (*)    All other components within normal limits  PHOSPHORUS - Abnormal; Notable for the following components:   Phosphorus 5.4 (*)    All other components within normal limits  CBG MONITORING, ED - Abnormal; Notable for the following components:   Glucose-Capillary 43 (*)    All other components within normal limits  MAGNESIUM  HEMOGLOBIN A1C  CBC  CREATININE, SERUM  TSH  T4, FREE  CBG MONITORING, ED    RADIOLOGY ED MD interpretation: One-view abdominal plain film interpreted by me and shows nonobstructive bowel gas pattern -Agree with radiology assessment Official radiology report(s): US Renal  Result Date: 02/25/2022 CLINICAL DATA:  Worsening renal function. EXAM: RENAL / URINARY TRACT ULTRASOUND COMPLETE COMPARISON:  None Available. FINDINGS: Right Kidney: Renal measurements: 12.1 x 5.7 x 4.9 cm = volume: 175 mL. Mild renal cortical thinning and increased echogenicity. No hydronephrosis. Multiple cysts within the right kidney measuring up to 4.2 cm. Left Kidney: Renal measurements: 12.3 x 6.4 x 5.5 cm =  volume: 225 mL. Renal cortical thinning. No hydronephrosis. No renal mass. Bladder: Appears normal for degree of bladder distention. Other: None. IMPRESSION: No hydronephrosis. Mild renal cortical thinning bilaterally, raising the possibility of chronic medical renal disease. Electronically Signed   By: Lovey Newcomer M.D.   On: 02/25/2022 15:21   DG Abdomen 1 View  Result Date: 02/25/2022 CLINICAL DATA:  Abdominal pain and dysuria. Urinary tract infection symptoms. EXAM: ABDOMEN - 1 VIEW COMPARISON:  Abdominal radiograph 02/19/2021 FINDINGS: Redemonstrated opacities within the lung base, nonspecific. Nonobstructed bowel-gas pattern. Cholecystectomy clips. Right hemipelvis phlebolith. Lumbar spine degenerative changes. Stool  throughout the colon. IMPRESSION: Nonobstructed bowel-gas pattern. No definite radiodensities to suggest nephrolithiasis. Electronically Signed   By: Lovey Newcomer M.D.   On: 02/25/2022 10:24   PROCEDURES: Critical Care performed: Yes, see critical care procedure note(s) .1-3 Lead EKG Interpretation  Performed by: Naaman Plummer, MD Authorized by: Naaman Plummer, MD     Interpretation: normal     ECG rate:  78   ECG rate assessment: normal     Rhythm: sinus rhythm     Ectopy: none     Conduction: normal   CRITICAL CARE Performed by: Naaman Plummer  Total critical care time: 31 minutes  Critical care time was exclusive of separately billable procedures and treating other patients.  Critical care was necessary to treat or prevent imminent or life-threatening deterioration.  Critical care was time spent personally by me on the following activities: development of treatment plan with patient and/or surrogate as well as nursing, discussions with consultants, evaluation of patient's response to treatment, examination of patient, obtaining history from patient or surrogate, ordering and performing treatments and interventions, ordering and review of laboratory studies, ordering and review of radiographic studies, pulse oximetry and re-evaluation of patient's condition.  MEDICATIONS ORDERED IN ED: Medications  amLODipine (NORVASC) tablet 5 mg (has no administration in time range)  albuterol (PROVENTIL) (2.5 MG/3ML) 0.083% nebulizer solution 2.5 mg (has no administration in time range)  aspirin EC tablet 81 mg (has no administration in time range)  metoprolol succinate (TOPROL-XL) 24 hr tablet 50 mg (has no administration in time range)  insulin aspart (novoLOG) injection 0-15 Units (has no administration in time range)  heparin injection 5,000 Units (has no administration in time range)  sodium chloride flush (NS) 0.9 % injection 3 mL (3 mLs Intravenous Given 02/25/22 1525)  0.9 %  sodium  chloride infusion ( Intravenous New Bag/Given 02/25/22 1525)  acetaminophen (TYLENOL) tablet 650 mg (has no administration in time range)    Or  acetaminophen (TYLENOL) suppository 650 mg (has no administration in time range)  HYDROcodone-acetaminophen (NORCO/VICODIN) 5-325 MG per tablet 1 tablet (has no administration in time range)  hydrALAZINE (APRESOLINE) injection 10 mg (has no administration in time range)   IMPRESSION / MDM / ASSESSMENT AND PLAN / ED COURSE  I reviewed the triage vital signs and the nursing notes.                             Differential diagnosis includes, but is not limited to, acute on chronic renal failure, urinary obstruction, prostatic hyperplasia, kidney stone The patient is on the cardiac monitor to evaluate for evidence of arrhythmia and/or significant heart rate changes. Patient's presentation is most consistent with acute presentation with potential threat to life or bodily function. Patient is an 81 year old male with the above-stated past medical history who presents for bilateral flank  and suprapubic pain in the setting of decreased urine output and worsening kidney function.  Patient shows signs of acute on chronic renal failure.  Patient electrolytes show a phosphorus of 5.4 and magnesium of 2.2 as well as normal potassium at 3.9.  Given patient's worsening renal function as well as decreased urine output, he will require admission to the internal medicine service for further evaluation and management.  Consults:  Nephrology-spoke with Dr. Holley Raring in nephrology who only recommended additional ultrasound of the kidneys as well as admission to the internal medicine service Hospitalist-I spoke to Dr. Posey Pronto who agrees with plan for admission to the internal medicine service for further evaluation and management.  Dispo: Admit to medicine   FINAL CLINICAL IMPRESSION(S) / ED DIAGNOSES   Final diagnoses:  Acute renal failure superimposed on chronic kidney  disease, unspecified CKD stage, unspecified acute renal failure type (HCC)  Bilateral flank pain  Suprapubic abdominal pain   Rx / DC Orders   ED Discharge Orders     None      Note:  This document was prepared using Dragon voice recognition software and may include unintentional dictation errors.   Naaman Plummer, MD 02/25/22 [phone]

## 2022-02-25 NOTE — ED Provider Notes (Signed)
[facility]    CSN: [id number] Arrival date & time: 02/25/22  0900      History   Chief Complaint Chief Complaint  Patient presents with   Abdominal Pain    Lower   Flank Pain    HPI James Clarke is a 81 y.o. male.   HPI  Patient presents with complaint of 2 weeks of not feeling well. He is in with his wife. He did have a stomach virus with diarrhea. He has had a decreased decreased appetite. He is now having pelvic and abdominal pain that radiates up. He is having urinary hesitancy and constipation with shortness of breath. He feels like the shortness of breath is getting worse. He oxygen level is 93-99. He does experience a drop is sat's with activity mainly getting a shower. He is followed by nephrology; Dr Holley Raring. He has has recent labs and his "kidney function was 15 but improved to 18; labs not in chart. He was seen by in home agency that completed a urinalysis which was negative for bacteria however positive for blood. He was encouraged to be seen for evaluation. He reports not feeling well. He is weak. He has sleep apnea is compliant with his CPAP. He was recently evaluated with a echo and cardiac cath which was essentially negative,.  He does have problems with leg swelling and wears compression hoses. He did use a suppository on Saturday for the constipation which provided relief x 1 day. He did not wear them today. Denies fever, chills, headache, dizziness, visual changes, chest pain, nausea, vomiting.  Past Medical History:  Diagnosis Date   Asthma    Bell's palsy    SEVERAL TIMES   Bell's palsy    Cancer ([place])    SKIN   Diabetes mellitus without complication ([place])    Dyspnea    DOE   History of kidney stones    Hyperlipemia    Hypertension    Kidney stone    Neuropathy    Obesity    Sleep apnea    C PAP    Patient Active Problem List   Diagnosis Date Noted   Right rotator cuff tear arthropathy 06/25/2021   Chronic right shoulder pain  06/25/2021   Localized osteoarthritis of right shoulder 06/25/2021   Chronic pain syndrome 37/04/6268   Acute systolic CHF (congestive heart failure) ([place]) 10/03/2020   Kidney stones 02/16/2020   Anemia in chronic kidney disease 07/26/2019   Chronic kidney disease, stage III (moderate) ([place]) 07/26/2019   Edema of lower extremity 07/26/2019   Malignant essential hypertension 07/26/2019   Secondary hyperparathyroidism of renal origin ([place]) 07/26/2019   Ureteral calculus 07/26/2019   Old complex tear of medial meniscus of right knee 10/07/2018   Primary osteoarthritis of right knee 10/07/2018   Fatty liver 04/16/2018   Aortic atherosclerosis ([place]) 04/16/2018   Bilateral leg edema 03/17/2018   Morbid obesity due to excess calories ([place]) 11/11/2014   Microalbuminuria 11/11/2014   Long-term insulin use ([place]) 11/11/2014   Perennial allergic rhinitis 09/12/2014   Type 2 diabetes mellitus with renal manifestations ([place]) 03/19/2014   Sleep apnea 03/19/2014   Hypertension 03/19/2014   Hyperlipemia 03/19/2014    Past Surgical History:  Procedure Laterality Date   APPENDECTOMY     CATARACT EXTRACTION W/PHACO Left 05/20/2017   Procedure: CATARACT EXTRACTION PHACO AND INTRAOCULAR LENS PLACEMENT ([place]);  Surgeon: Birder Robson, MD;  Location: [facility];  Service: Ophthalmology;  Laterality: Left;  [place] 00:32.0 AP% 15.5 CDE  4.97 Fluid Pack Lot # [id number]   CATARACT EXTRACTION W/PHACO Right 06/10/2017   Procedure: CATARACT EXTRACTION PHACO AND INTRAOCULAR LENS PLACEMENT ([facility]);  Surgeon: Birder Robson, MD;  Location: [facility];  Service: Ophthalmology;  Laterality: Right;  [place]  00:51 AP% 15.4 CDE 7.95 Fluid pack lot # [id number]   CHOLECYSTECTOMY     CYSTOSCOPY W/ RETROGRADES Right 08/13/2019   Procedure: CYSTOSCOPY WITH RETROGRADE PYELOGRAM;  Surgeon: Billey Co, MD;  Location: [facility];  Service: Urology;  Laterality: Right;   CYSTOSCOPY WITH STENT PLACEMENT Right 07/26/2019   Procedure:  CYSTOSCOPY WITH STENT PLACEMENT;  Surgeon: Lucas Mallow, MD;  Location: [facility];  Service: Urology;  Laterality: Right;   CYSTOSCOPY/URETEROSCOPY/HOLMIUM LASER/STENT PLACEMENT Right 08/13/2019   Procedure: CYSTOSCOPY/URETEROSCOPY/HOLMIUM LASER/STENT EXCHANGE;  Surgeon: Billey Co, MD;  Location: [facility];  Service: Urology;  Laterality: Right;   FRACTURE SURGERY     WRIST   HAND SURGERY     HERNIA REPAIR     LEFT HEART CATH AND CORONARY ANGIOGRAPHY N/A 12/31/2021   Procedure: LEFT HEART CATH AND CORONARY ANGIOGRAPHY;  Surgeon: Corey Skains, MD;  Location: [facility];  Service: Cardiovascular;  Laterality: N/A;   SHOULDER ARTHROSCOPY WITH SUBACROMIAL DECOMPRESSION AND OPEN ROTATOR C Right 09/11/2020   Procedure: Right shoulder arthroscopic rotator cuff repair vs Regeneten patch application, subacromial decompression, and biceps tenodesis - Reche Dixon to Assist;  Surgeon: Leim Fabry, MD;  Location: [facility];  Service: Orthopedics;  Laterality: Right;   TONSILLECTOMY         Home Medications    Prior to Admission medications   Medication Sig Start Date End Date Taking? Authorizing Provider  albuterol (PROVENTIL HFA;VENTOLIN HFA) 108 (90 Base) MCG/ACT inhaler Inhale 2 puffs into the lungs every 6 (six) hours as needed for wheezing or shortness of breath.    [provider]  amLODipine (NORVASC) 5 MG tablet Take 5 mg by mouth daily. 06/12/21   [provider]  aspirin EC 81 MG tablet Take by mouth.    [provider]  calcitRIOL (ROCALTROL) 0.25 MCG capsule Take 0.25 mcg by mouth daily. 07/13/19   [provider]  furosemide (LASIX) 40 MG tablet Take 40 mg by mouth 3 (three) times a week. 05/06/18   [provider]  GLOBAL INJECT EASE INSULIN SYR 31G X 5/16" 0.5 ML MISC Inject into the skin. 08/16/21   [provider]  GLOBAL INJECT EASE INSULIN SYR 31G X 5/16" 1 ML MISC 615 mg.  08/16/19   [provider]   glucose blood (ONETOUCH ULTRA) test strip TEST BLOOD SUGAR 4 TIMES DAILY 10/26/18   [provider]  insulin aspart (NOVOLOG) 100 UNIT/ML injection Inject 30 units before breakfast and lunch. Inject 55 units before supper. 08/15/21   [provider]  insulin detemir (LEVEMIR) 100 UNIT/ML injection Inject 50 mLs into the skin daily. 08/15/21   [provider]  JARDIANCE 25 MG TABS tablet Take 25 mg by mouth daily. Patient not taking: Reported on 12/31/2021 01/05/21   [provider]  ketoconazole (NIZORAL) 2 % cream Apply topically daily. 10/05/21   [provider]  Lancets (ONETOUCH DELICA PLUS [id number]) [place] USE 1 LANCET 4 TIMES DAILY 05/04/19   [provider]  lovastatin (MEVACOR) 40 MG tablet Take 40 mg by mouth daily with supper. 04/28/17   [provider]  metoprolol succinate (TOPROL-XL) 50 MG 24 hr tablet Take 50 mg by mouth daily. 09/01/21  [provider]  neomycin-bacitracin-polymyxin (NEOSPORIN) ointment Apply 1 application topically every 12 (twelve) hours.    [provider]  potassium chloride SA (K-DUR,KLOR-CON) 20 MEQ tablet Take 20 mEq by mouth daily. 04/28/17   [provider]  valsartan (DIOVAN) 320 MG tablet  07/05/21   [provider]  Vitamins A & D (VITAMIN A & D) ointment Apply 1 application topically as needed for dry skin. Patient not taking: Reported on 12/31/2021    [provider]    Family History Family History  Problem Relation Age of Onset   Emphysema Mother    COPD Mother    Heart disease Mother    Brain cancer Father     Social History Social History   Tobacco Use   Smoking status: Never    Passive exposure: Never   Smokeless tobacco: Never  Vaping Use   Vaping Use: Never used  Substance Use Topics   Alcohol use: No   Drug use: Not Currently     Allergies   Codeine, Doxycycline, and Erythromycin   Review of Systems Review of  Systems   Physical Exam Triage Vital Signs ED Triage Vitals  Enc Vitals Group     BP      Pulse      Resp      Temp      Temp src      SpO2      Weight      Height      Head Circumference      Peak Flow      Pain Score      Pain Loc      Pain Edu?      Excl. in [place]?    No data found.  Updated Vital Signs BP (!) 171/66 (BP Location: Left Arm)   Pulse 74   Temp 98.1 F (36.7 C)   Ht '5\' 10"'$  (1.778 m)   Wt (!) 330 lb (149.7 kg)   SpO2 98%   BMI 47.35 kg/m   Visual Acuity Right Eye Distance:   Left Eye Distance:   Bilateral Distance:    Right Eye Near:   Left Eye Near:    Bilateral Near:     Physical Exam Constitutional:      General: He is not in acute distress.    Appearance: He is obese. He is not ill-appearing, toxic-appearing or diaphoretic.  HENT:     Head: Normocephalic and atraumatic.  Cardiovascular:     Rate and Rhythm: Normal rate and regular rhythm.     Pulses: Normal pulses.     Heart sounds: Normal heart sounds.  Pulmonary:     Effort: Pulmonary effort is normal.     Breath sounds: Normal breath sounds.  Abdominal:     Palpations: Abdomen is soft.     Comments: Increased abdominal girth Decreased -hypoactive   Musculoskeletal:     Cervical back: Normal range of motion.     Right lower leg: Edema (2+) present.     Left lower leg: Edema (2+) present.     Comments: Slow movement but appro.  Skin:    General: Skin is warm and dry.     Capillary Refill: Capillary refill takes less than 2 seconds.  Neurological:     Mental Status: He is alert.  Psychiatric:        Mood and Affect: Mood normal.        Behavior: Behavior normal.      UC  Treatments / Results  Labs (all labs ordered are listed, but only abnormal results are displayed) Labs Reviewed  URINALYSIS, ROUTINE W REFLEX MICROSCOPIC - Abnormal; Notable for the following components:      Result Value   Glucose, UA 250 (*)    Hgb urine dipstick SMALL (*)    Protein, ur >300 (*)     All other components within normal limits  URINALYSIS, MICROSCOPIC (REFLEX)  COMPREHENSIVE METABOLIC PANEL  BRAIN NATRIURETIC PEPTIDE    EKG   Radiology No results found.  Procedures Procedures (including critical care time)  Medications Ordered in UC Medications - No data to display  Initial Impression / Assessment and Plan / UC Course  I have reviewed the triage vital signs and the nursing notes.  Pertinent labs & imaging results that were available during my care of the patient were reviewed by me and considered in my medical decision making (see chart for details).    ED Course   Studies: Lab: Urinalysis - {:60657} Imaging: X-ray: {:60649} Ultrasound: {:60651}  Records Reviewed: {Reviewed:60251}  Treatments: {ED GU Tx list:18158}  Consultations: {Consultations:60250}  Disposition: {ED Disposition:60811}  *** Final Clinical Impressions(s) / UC Diagnoses   Final diagnoses:  Abdominal pain, unspecified abdominal location   Discharge Instructions   None    ED Prescriptions   None    PDMP not reviewed this encounter.

## 2022-02-25 NOTE — ED Triage Notes (Signed)
Patient reports that he has been having UTI symptoms since last week.   Patient reports lower abdominal pain and flank pain. Patient reports that he has not been able to have a BM in a few days.   Dr. Holley Raring office referred him here due to him being out of the office.   Patient is very SOB and weak here in the office.

## 2022-02-25 NOTE — H&P (Signed)
History and Physical    James Clarke CBU:[id number] DOB: 08/30/1940 DOA: 02/25/2022  PCP: James Hartigan, MD    Patient coming from:  Home/urgent care   Chief Complaint:  Abdominal pain   HPI:  James Clarke is a 81 y.o. male seen in ed with complaints of abdominal pain and back pain patient was evaluated in the urgent care initially and was referred to the emergency room after being told that he has acute kidney injury.  Patient has chronic kidney disease and is followed by Dr. Zollie Clarke.  Pt has past medical history of chronic kidney disease, hypertension, diabetes mellitus type 2, hyperlipidemia, kidney stones, obstructive sleep apnea, allergies to codeine, doxycycline, erythromycin.   ED Course:   Vitals:   02/25/22 1621 02/25/22 2020 02/25/22 2300 02/25/22 2310  BP: (!) 150/82 (!) 152/69 (!) 153/70   Pulse: 72 74 76 76  Resp: 19 (!) 24 (!) 23   Temp:  98.5 F (36.9 C)    TempSrc:  Oral    SpO2: 97% 95% 95% 95%  Weight:      Height:      In the emergency room patient is alert awake oriented afebrile hypertensive. CMP shows glucose of 118 BUN of 80 creatinine of 5.61 calcium of 8.4 albumin 2.8 GFR of 10 normal LFTs otherwise. BNP of 401.9 CBC shows a white count of 13.3 hemoglobin of 11.3 normal differential is otherwise. Urinalysis shows more than 300 protein and small hemoglobin with 0-5 RBCs and 0-5 WBCs. Renal ultrasound pending. Most recent 2D echocardiogram done and 2018 may shows normal left ventricular cavity size systolic function normal ejection fraction between 50 to 55%.   Review of Systems:  Review of Systems  Constitutional:  Positive for malaise/fatigue. Negative for fever.  Gastrointestinal:  Positive for abdominal pain.  Genitourinary:  Positive for dysuria and flank pain.  Musculoskeletal:  Positive for back pain.  Neurological:  Positive for weakness.  All other systems reviewed and are negative.     Past Medical History:  Diagnosis  Date   Asthma    Bell's palsy    SEVERAL TIMES   Bell's palsy    Cancer ([place])    SKIN   Diabetes mellitus without complication ([place])    Dyspnea    DOE   History of kidney stones    Hyperlipemia    Hypertension    Kidney stone    Neuropathy    Obesity    Sleep apnea    C PAP    Past Surgical History:  Procedure Laterality Date   APPENDECTOMY     CATARACT EXTRACTION W/PHACO Left 05/20/2017   Procedure: CATARACT EXTRACTION PHACO AND INTRAOCULAR LENS PLACEMENT ([place]);  Surgeon: Birder Robson, MD;  Location: [facility];  Service: Ophthalmology;  Laterality: Left;  [place] 00:32.0 AP% 15.5 CDE 4.97 Fluid Pack Lot # [id number]   CATARACT EXTRACTION W/PHACO Right 06/10/2017   Procedure: CATARACT EXTRACTION PHACO AND INTRAOCULAR LENS PLACEMENT ([facility]);  Surgeon: Birder Robson, MD;  Location: [facility];  Service: Ophthalmology;  Laterality: Right;  [place]  00:51 AP% 15.4 CDE 7.95 Fluid pack lot # [id number]   CHOLECYSTECTOMY     CYSTOSCOPY W/ RETROGRADES Right 08/13/2019   Procedure: CYSTOSCOPY WITH RETROGRADE PYELOGRAM;  Surgeon: Billey Co, MD;  Location: [facility];  Service: Urology;  Laterality: Right;   CYSTOSCOPY WITH STENT PLACEMENT Right 07/26/2019   Procedure: CYSTOSCOPY WITH STENT PLACEMENT;  Surgeon: Lucas Mallow, MD;  Location: [facility];  Service: Urology;  Laterality: Right;   CYSTOSCOPY/URETEROSCOPY/HOLMIUM LASER/STENT PLACEMENT Right 08/13/2019   Procedure: CYSTOSCOPY/URETEROSCOPY/HOLMIUM LASER/STENT EXCHANGE;  Surgeon: Billey Co, MD;  Location: [facility];  Service: Urology;  Laterality: Right;   FRACTURE SURGERY     WRIST   HAND SURGERY     HERNIA REPAIR     LEFT HEART CATH AND CORONARY ANGIOGRAPHY N/A 12/31/2021   Procedure: LEFT HEART CATH AND CORONARY ANGIOGRAPHY;  Surgeon: Corey Skains, MD;  Location: [facility];  Service: Cardiovascular;  Laterality: N/A;   SHOULDER ARTHROSCOPY WITH SUBACROMIAL DECOMPRESSION AND OPEN ROTATOR C Right 09/11/2020    Procedure: Right shoulder arthroscopic rotator cuff repair vs Regeneten patch application, subacromial decompression, and biceps tenodesis - Reche Dixon to Assist;  Surgeon: Leim Fabry, MD;  Location: [facility];  Service: Orthopedics;  Laterality: Right;   TONSILLECTOMY       reports that he has never smoked. He has never been exposed to tobacco smoke. He has never used smokeless tobacco. He reports that he does not currently use drugs. He reports that he does not drink alcohol.  Allergies  Allergen Reactions   Codeine Nausea And Vomiting   Doxycycline    Erythromycin Rash    Family History  Problem Relation Age of Onset   Emphysema Mother    COPD Mother    Heart disease Mother    Brain cancer Father     Prior to Admission medications   Medication Sig Start Date End Date Taking? Authorizing Provider  albuterol (PROVENTIL HFA;VENTOLIN HFA) 108 (90 Base) MCG/ACT inhaler Inhale 2 puffs into the lungs every 6 (six) hours as needed for wheezing or shortness of breath.    [provider]  amLODipine (NORVASC) 5 MG tablet Take 5 mg by mouth daily. 06/12/21   [provider]  aspirin EC 81 MG tablet Take by mouth.    [provider]  calcitRIOL (ROCALTROL) 0.25 MCG capsule Take 0.25 mcg by mouth daily. 07/13/19   [provider]  furosemide (LASIX) 40 MG tablet Take 40 mg by mouth 3 (three) times a week. 05/06/18   [provider]  GLOBAL INJECT EASE INSULIN SYR 31G X 5/16" 0.5 ML MISC Inject into the skin. 08/16/21   [provider]  GLOBAL INJECT EASE INSULIN SYR 31G X 5/16" 1 ML MISC 615 mg.  08/16/19   [provider]  glucose blood (ONETOUCH ULTRA) test strip TEST BLOOD SUGAR 4 TIMES DAILY 10/26/18   [provider]  insulin aspart (NOVOLOG) 100 UNIT/ML injection Inject 30 units before breakfast and lunch. Inject 55 units before supper. 08/15/21   [provider]  insulin detemir (LEVEMIR) 100 UNIT/ML  injection Inject 50 mLs into the skin daily. 08/15/21   [provider]  ketoconazole (NIZORAL) 2 % cream Apply topically daily. 10/05/21   [provider]  Lancets (ONETOUCH DELICA PLUS [id number]) [place] USE 1 LANCET 4 TIMES DAILY 05/04/19   [provider]  lovastatin (MEVACOR) 40 MG tablet Take 40 mg by mouth daily with supper. 04/28/17   [provider]  metoprolol succinate (TOPROL-XL) 50 MG 24 hr tablet Take 50 mg by mouth daily. 09/01/21   [provider]  neomycin-bacitracin-polymyxin (NEOSPORIN) ointment Apply 1 application topically every 12 (twelve) hours.    [provider]  potassium chloride SA (K-DUR,KLOR-CON) 20 MEQ tablet Take 20 mEq by mouth daily. 04/28/17   [provider]  valsartan (DIOVAN) 320 MG tablet  07/05/21   [provider]  Vitamins  A & D (VITAMIN A & D) ointment Apply 1 application topically as needed for dry skin. Patient not taking: Reported on 12/31/2021    [provider]    Physical Exam: Vitals:   02/25/22 1621 02/25/22 2020 02/25/22 2300 02/25/22 2310  BP: (!) 150/82 (!) 152/69 (!) 153/70   Pulse: 72 74 76 76  Resp: 19 (!) 24 (!) 23   Temp:  98.5 F (36.9 C)    TempSrc:  Oral    SpO2: 97% 95% 95% 95%  Weight:      Height:       Physical Exam Vitals and nursing note reviewed.  Constitutional:      General: He is not in acute distress.    Appearance: Normal appearance. He is not ill-appearing, toxic-appearing or diaphoretic.  HENT:     Head: Normocephalic and atraumatic.     Right Ear: Hearing and external ear normal.     Left Ear: Hearing and external ear normal.     Nose: Nose normal. No nasal deformity.     Mouth/Throat:     Lips: Pink.     Mouth: Mucous membranes are moist.     Tongue: No lesions.     Pharynx: Oropharynx is clear.  Eyes:     Extraocular Movements: Extraocular movements intact.     Pupils: Pupils are equal, round, and reactive to light.  Neck:      Vascular: No carotid bruit.  Cardiovascular:     Rate and Rhythm: Normal rate and regular rhythm.     Pulses: Normal pulses.          Dorsalis pedis pulses are 2+ on the right side and 2+ on the left side.       Posterior tibial pulses are 2+ on the right side and 2+ on the left side.     Heart sounds: Normal heart sounds.  Pulmonary:     Effort: Pulmonary effort is normal.     Breath sounds: Normal breath sounds.  Abdominal:     General: Bowel sounds are normal. There is no distension.     Palpations: Abdomen is soft. There is no mass.     Tenderness: There is no abdominal tenderness. There is no guarding.     Hernia: No hernia is present.    Musculoskeletal:       Back:     Right lower leg: 2+ Edema present.     Left lower leg: 2+ Edema present.  Skin:    General: Skin is warm.  Neurological:     General: No focal deficit present.     Mental Status: He is alert and oriented to person, place, and time.     Cranial Nerves: Cranial nerves 2-12 are intact.     Motor: Motor function is intact.  Psychiatric:        Attention and Perception: Attention normal.        Mood and Affect: Mood normal.        Speech: Speech normal.        Behavior: Behavior normal. Behavior is cooperative.        Cognition and Memory: Cognition normal.      Labs on Admission: I have personally reviewed following labs and imaging studies BMET Recent Labs  Lab 02/25/22 1005  NA 135  K 3.9  CL 105  CO2 23  BUN 80*  CREATININE 5.61*  GLUCOSE 118*   Electrolytes Recent Labs  Lab 02/25/22 1005 02/25/22 1316  CALCIUM  8.4*  --   MG  --  2.2  PHOS  --  5.4*   Sepsis Markers No results for input(s): "LATICACIDVEN", "PROCALCITON", "O2SATVEN" in the last 168 hours. ABG No results for input(s): "PHART", "PCO2ART", "PO2ART" in the last 168 hours. Liver Enzymes Recent Labs  Lab 02/25/22 1005  AST 19  ALT 14  ALKPHOS 71  BILITOT 0.5  ALBUMIN 2.8*   Cardiac Enzymes No results for  input(s): "TROPONINI", "PROBNP" in the last 168 hours. No results found for: "DDIMER" Coag's No results for input(s): "APTT", "INR" in the last 168 hours.  No results found for this or any previous visit (from the past 240 hour(s)).   Current Facility-Administered Medications:    0.9 %  sodium chloride infusion, , Intravenous, Continuous, Para Skeans, MD, Last Rate: 20 mL/hr at 02/25/22 2054, New Bag at 02/25/22 2054   acetaminophen (TYLENOL) tablet 650 mg, 650 mg, Oral, Q6H PRN **OR** acetaminophen (TYLENOL) suppository 650 mg, 650 mg, Rectal, Q6H PRN, Para Skeans, MD   albuterol (PROVENTIL) (2.5 MG/3ML) 0.083% nebulizer solution 2.5 mg, 2.5 mg, Inhalation, Q6H PRN, Para Skeans, MD   amLODipine (NORVASC) tablet 5 mg, 5 mg, Oral, Daily, Florina Ou V, MD, 5 mg at 02/25/22 1621   aspirin EC tablet 81 mg, 81 mg, Oral, Daily, Florina Ou V, MD   heparin injection 5,000 Units, 5,000 Units, Subcutaneous, Q8H, Para Skeans, MD, 5,000 Units at 02/25/22 2223   hydrALAZINE (APRESOLINE) injection 10 mg, 10 mg, Intravenous, Q4H PRN, Para Skeans, MD   HYDROcodone-acetaminophen (NORCO/VICODIN) 5-325 MG per tablet 1 tablet, 1 tablet, Oral, Q4H PRN, Posey Pronto, Tela Kotecki V, MD   insulin aspart (novoLOG) injection 0-15 Units, 0-15 Units, Subcutaneous, TID WC, Leif Loflin V, MD   metoprolol succinate (TOPROL-XL) 24 hr tablet 50 mg, 50 mg, Oral, Daily, Ytzel Gubler V, MD   sodium chloride flush (NS) 0.9 % injection 3 mL, 3 mL, Intravenous, Q12H, Florina Ou V, MD, 3 mL at 02/25/22 2223  Current Outpatient Medications:    amLODipine (NORVASC) 5 MG tablet, Take 5 mg by mouth daily., Disp: , Rfl:    aspirin EC 81 MG tablet, Take by mouth., Disp: , Rfl:    calcitRIOL (ROCALTROL) 0.25 MCG capsule, Take 0.25 mcg by mouth daily., Disp: , Rfl:    clotrimazole (LOTRIMIN) 1 % cream, Apply 1 Application topically daily as needed., Disp: , Rfl:    furosemide (LASIX) 40 MG tablet, Take 40 mg by mouth 3 (three) times a  week., Disp: , Rfl:    insulin aspart (NOVOLOG) 100 UNIT/ML injection, Inject 30 units before breakfast and lunch. Inject 55 units before supper., Disp: , Rfl:    insulin detemir (LEVEMIR) 100 UNIT/ML injection, Inject 50 mLs into the skin daily., Disp: , Rfl:    lovastatin (MEVACOR) 40 MG tablet, Take 40 mg by mouth daily with supper., Disp: , Rfl:    metoprolol succinate (TOPROL-XL) 50 MG 24 hr tablet, Take 50 mg by mouth daily., Disp: , Rfl:    potassium chloride SA (K-DUR,KLOR-CON) 20 MEQ tablet, Take 20 mEq by mouth daily., Disp: , Rfl:    valsartan (DIOVAN) 320 MG tablet, , Disp: , Rfl:    albuterol (PROVENTIL HFA;VENTOLIN HFA) 108 (90 Base) MCG/ACT inhaler, Inhale 2 puffs into the lungs every 6 (six) hours as needed for wheezing or shortness of breath., Disp: , Rfl:    GLOBAL INJECT EASE INSULIN SYR 31G X 5/16" 0.5 ML MISC, Inject into the skin.,  Disp: , Rfl:    GLOBAL INJECT EASE INSULIN SYR 31G X 5/16" 1 ML MISC, 615 mg. , Disp: , Rfl:    glucose blood (ONETOUCH ULTRA) test strip, TEST BLOOD SUGAR 4 TIMES DAILY, Disp: , Rfl:    ketoconazole (NIZORAL) 2 % cream, Apply topically daily. (Patient not taking: Reported on 02/25/2022), Disp: , Rfl:    Lancets (ONETOUCH DELICA PLUS [id number]) MISC, USE 1 LANCET 4 TIMES DAILY, Disp: , Rfl:    neomycin-bacitracin-polymyxin (NEOSPORIN) ointment, Apply 1 application topically every 12 (twelve) hours. (Patient not taking: Reported on 02/25/2022), Disp: , Rfl:    Vitamins A & D (VITAMIN A & D) ointment, Apply 1 application topically as needed for dry skin. (Patient not taking: Reported on 12/31/2021), Disp: , Rfl:   COVID-19 Labs No results for input(s): "DDIMER", "FERRITIN", "LDH", "CRP" in the last 72 hours. Lab Results  Component Value Date   [place] NEGATIVE 09/08/2020   [place] NEGATIVE 08/11/2019   [place] NEGATIVE 07/26/2019    Radiological Exams on Admission: US Renal  Result Date: 02/25/2022 CLINICAL DATA:  Worsening renal  function. EXAM: RENAL / URINARY TRACT ULTRASOUND COMPLETE COMPARISON:  None Available. FINDINGS: Right Kidney: Renal measurements: 12.1 x 5.7 x 4.9 cm = volume: 175 mL. Mild renal cortical thinning and increased echogenicity. No hydronephrosis. Multiple cysts within the right kidney measuring up to 4.2 cm. Left Kidney: Renal measurements: 12.3 x 6.4 x 5.5 cm = volume: 225 mL. Renal cortical thinning. No hydronephrosis. No renal mass. Bladder: Appears normal for degree of bladder distention. Other: None. IMPRESSION: No hydronephrosis. Mild renal cortical thinning bilaterally, raising the possibility of chronic medical renal disease. Electronically Signed   By: Lovey Newcomer M.D.   On: 02/25/2022 15:21   DG Abdomen 1 View  Result Date: 02/25/2022 CLINICAL DATA:  Abdominal pain and dysuria. Urinary tract infection symptoms. EXAM: ABDOMEN - 1 VIEW COMPARISON:  Abdominal radiograph 02/19/2021 FINDINGS: Redemonstrated opacities within the lung base, nonspecific. Nonobstructed bowel-gas pattern. Cholecystectomy clips. Right hemipelvis phlebolith. Lumbar spine degenerative changes. Stool throughout the colon. IMPRESSION: Nonobstructed bowel-gas pattern. No definite radiodensities to suggest nephrolithiasis. Electronically Signed   By: Lovey Newcomer M.D.   On: 02/25/2022 10:24    EKG: Independently reviewed.  None    Assessment and Plan: * Abdominal pain Patient complains of abdominal pain in the lower abdomen bilaterally suprapubic area bilateral flank pain and found to be in acute kidney injury. Ultrasound of the kidneys was done and was negative for any  Hydronephrosis. CT of the abdomen and pelvis noncontrast is pending and we will follow.  Acute renal failure superimposed on stage 3 chronic kidney disease ([place]) Etiology suspected in this case is prerenal with decreased p.o. intake or renal. Less likely postrenal or postobstructive patient is not having any difficulty voiding urine. Overnight if patient  develops any difficulty we will do a bladder scan and start with indwelling Foley. We will avoid contrast studies and renally dose all needed medications.  Stop Diovan. Nephrology consult ordered. Lab Results  Component Value Date   CREATININE 5.61 (H) 02/25/2022   CREATININE 2.35 (H) 10/17/2020   CREATININE 2.99 (H) 10/11/2020     Type 2 diabetes mellitus with renal manifestations ([place]) Currently we will hold patient's home regimen of Levemir and NovoLog. We will continue patient on glycemic protocol with Accu-Cheks Premeal 3 times daily before every meal nightly. Carb consistent diet and renal diet and A1c.  Chronic systolic CHF (congestive heart failure) (HCC) Daily weights, strict I's  and O's.  Continue patient on amlodipine aspirin, metoprolol. Most recent 2D echocardiogram done and 2018 may shows normal left ventricular cavity size systolic function normal ejection fraction between 50 to 55%.  Bilateral leg edema Patient has bilateral leg edema. And also has underlying history of chronic systolic congestive heart failure. Patient is clinically not volume overloaded and I do not suspect this is the cause of the swelling but in combination of hypoalbuminemia and amlodipine.  As needed diuretic therapy as needed. We will also obtain a venous Doppler of both his legs.  Hypertension Blood pressure (!) 153/70, pulse 76, temperature 98.5 F (36.9 C), temperature source Oral, resp. rate (!) 23, height '5\' 10"'$  (1.778 m), weight (!) 149 kg, SpO2 95 %.  Vitals:   02/25/22 1257 02/25/22 1455 02/25/22 1621 02/25/22 2020  BP: (!) 167/75 (!) 155/70 (!) 150/82 (!) 152/69   02/25/22 2300  BP: (!) 153/70  Will continue patient on his pain, metoprolol with as needed hydralazine. Diovan held secondary to acute kidney injury on chronic kidney disease.    Sleep apnea CPAP per home setting.    DVT prophylaxis:  Heparin  Code Status:  Full code  Family Communication:  Berber,Delores  (Spouse)  [phone] (Mobile)   Disposition Plan:  Home  Consults called:  Nephrology: Dr. Zollie Clarke  Admission status: Observation   Para Skeans MD [facility]  6 PM- 2 AM. Please contact me via secure Chat 6 PM-2 AM. [phone] ( Pager ) To contact the [facility] Attending or Consulting provider [place] or covering provider during after hours [place], for this patient.   Check the care team in [facility] and look for a) attending/consulting [facility] provider listed and b) the [facility] team listed Log into [email or web address] and use [place]'s universal password to access. If you do not have the password, please contact the hospital operator. Locate the [facility] provider you are looking for under [facility] and page to a number that you can be directly reached. If you still have difficulty reaching the provider, please page the [facility] (Director on Call) for the Hospitalists listed on amion for assistance. [email or web address] 02/26/2022, 1:35 AM

## 2022-02-25 NOTE — ED Notes (Signed)
Patient is being discharged from the Urgent Care and sent to the Emergency Department via POV . Per Charmayne Sheer NP, patient is in need of higher level of care due to elevated Creatinine. Patient is aware and verbalizes understanding of plan of care.  Vitals:   02/25/22 0924  BP: (!) 171/66  Pulse: 74  Temp: 98.1 F (36.7 C)  SpO2: 98%

## 2022-02-26 ENCOUNTER — Ambulatory Visit: Payer: PPO | Admitting: Urology

## 2022-02-26 ENCOUNTER — Encounter: Payer: Self-pay | Admitting: Internal Medicine

## 2022-02-26 ENCOUNTER — Observation Stay
Admit: 2022-02-26 | Discharge: 2022-02-26 | Disposition: A | Discharge status: Home / Self Care | Payer: PPO | Attending: Internal Medicine | Admitting: Internal Medicine

## 2022-02-26 ENCOUNTER — Observation Stay: Payer: PPO

## 2022-02-26 DIAGNOSIS — I5032 Chronic diastolic (congestive) heart failure: Secondary | ICD-10-CM | POA: Diagnosis present

## 2022-02-26 DIAGNOSIS — Z515 Encounter for palliative care: Secondary | ICD-10-CM | POA: Diagnosis not present

## 2022-02-26 DIAGNOSIS — G4730 Sleep apnea, unspecified: Secondary | ICD-10-CM | POA: Diagnosis present

## 2022-02-26 DIAGNOSIS — Z9889 Other specified postprocedural states: Secondary | ICD-10-CM | POA: Diagnosis not present

## 2022-02-26 DIAGNOSIS — J45909 Unspecified asthma, uncomplicated: Secondary | ICD-10-CM | POA: Diagnosis present

## 2022-02-26 DIAGNOSIS — R109 Unspecified abdominal pain: Secondary | ICD-10-CM | POA: Diagnosis present

## 2022-02-26 DIAGNOSIS — N281 Cyst of kidney, acquired: Secondary | ICD-10-CM | POA: Diagnosis not present

## 2022-02-26 DIAGNOSIS — N3289 Other specified disorders of bladder: Secondary | ICD-10-CM | POA: Diagnosis present

## 2022-02-26 DIAGNOSIS — E1165 Type 2 diabetes mellitus with hyperglycemia: Secondary | ICD-10-CM | POA: Diagnosis present

## 2022-02-26 DIAGNOSIS — N1330 Unspecified hydronephrosis: Secondary | ICD-10-CM | POA: Diagnosis not present

## 2022-02-26 DIAGNOSIS — F419 Anxiety disorder, unspecified: Secondary | ICD-10-CM | POA: Diagnosis present

## 2022-02-26 DIAGNOSIS — N201 Calculus of ureter: Secondary | ICD-10-CM | POA: Diagnosis not present

## 2022-02-26 DIAGNOSIS — K5900 Constipation, unspecified: Secondary | ICD-10-CM | POA: Diagnosis present

## 2022-02-26 DIAGNOSIS — I129 Hypertensive chronic kidney disease with stage 1 through stage 4 chronic kidney disease, or unspecified chronic kidney disease: Secondary | ICD-10-CM | POA: Diagnosis not present

## 2022-02-26 DIAGNOSIS — B379 Candidiasis, unspecified: Secondary | ICD-10-CM | POA: Diagnosis present

## 2022-02-26 DIAGNOSIS — E785 Hyperlipidemia, unspecified: Secondary | ICD-10-CM | POA: Diagnosis present

## 2022-02-26 DIAGNOSIS — N189 Chronic kidney disease, unspecified: Secondary | ICD-10-CM | POA: Diagnosis not present

## 2022-02-26 DIAGNOSIS — E8809 Other disorders of plasma-protein metabolism, not elsewhere classified: Secondary | ICD-10-CM | POA: Diagnosis present

## 2022-02-26 DIAGNOSIS — R600 Localized edema: Secondary | ICD-10-CM | POA: Diagnosis not present

## 2022-02-26 DIAGNOSIS — N184 Chronic kidney disease, stage 4 (severe): Secondary | ICD-10-CM

## 2022-02-26 DIAGNOSIS — I120 Hypertensive chronic kidney disease with stage 5 chronic kidney disease or end stage renal disease: Secondary | ICD-10-CM | POA: Diagnosis not present

## 2022-02-26 DIAGNOSIS — G4733 Obstructive sleep apnea (adult) (pediatric): Secondary | ICD-10-CM | POA: Diagnosis not present

## 2022-02-26 DIAGNOSIS — E1122 Type 2 diabetes mellitus with diabetic chronic kidney disease: Secondary | ICD-10-CM | POA: Diagnosis present

## 2022-02-26 DIAGNOSIS — Z794 Long term (current) use of insulin: Secondary | ICD-10-CM | POA: Diagnosis not present

## 2022-02-26 DIAGNOSIS — Z8249 Family history of ischemic heart disease and other diseases of the circulatory system: Secondary | ICD-10-CM | POA: Diagnosis not present

## 2022-02-26 DIAGNOSIS — N179 Acute kidney failure, unspecified: Secondary | ICD-10-CM | POA: Diagnosis present

## 2022-02-26 DIAGNOSIS — D631 Anemia in chronic kidney disease: Secondary | ICD-10-CM | POA: Diagnosis present

## 2022-02-26 DIAGNOSIS — R1030 Lower abdominal pain, unspecified: Secondary | ICD-10-CM | POA: Diagnosis not present

## 2022-02-26 DIAGNOSIS — E1129 Type 2 diabetes mellitus with other diabetic kidney complication: Secondary | ICD-10-CM | POA: Diagnosis not present

## 2022-02-26 DIAGNOSIS — N136 Pyonephrosis: Secondary | ICD-10-CM | POA: Diagnosis present

## 2022-02-26 DIAGNOSIS — I130 Hypertensive heart and chronic kidney disease with heart failure and stage 1 through stage 4 chronic kidney disease, or unspecified chronic kidney disease: Secondary | ICD-10-CM | POA: Diagnosis present

## 2022-02-26 DIAGNOSIS — Z87442 Personal history of urinary calculi: Secondary | ICD-10-CM | POA: Diagnosis not present

## 2022-02-26 DIAGNOSIS — I10 Essential (primary) hypertension: Secondary | ICD-10-CM | POA: Diagnosis not present

## 2022-02-26 DIAGNOSIS — D649 Anemia, unspecified: Secondary | ICD-10-CM | POA: Diagnosis not present

## 2022-02-26 DIAGNOSIS — R159 Full incontinence of feces: Secondary | ICD-10-CM | POA: Diagnosis present

## 2022-02-26 DIAGNOSIS — Z6841 Body Mass Index (BMI) 40.0 and over, adult: Secondary | ICD-10-CM | POA: Diagnosis not present

## 2022-02-26 DIAGNOSIS — Z992 Dependence on renal dialysis: Secondary | ICD-10-CM | POA: Diagnosis not present

## 2022-02-26 DIAGNOSIS — E1142 Type 2 diabetes mellitus with diabetic polyneuropathy: Secondary | ICD-10-CM | POA: Diagnosis present

## 2022-02-26 DIAGNOSIS — N185 Chronic kidney disease, stage 5: Secondary | ICD-10-CM | POA: Diagnosis not present

## 2022-02-26 LAB — GLUCOSE, CAPILLARY
Glucose-Capillary: 256 mg/dL — ABNORMAL HIGH (ref 70–99)
Glucose-Capillary: 188 mg/dL — ABNORMAL HIGH (ref 70–99)
Glucose-Capillary: 245 mg/dL — ABNORMAL HIGH (ref 70–99)

## 2022-02-26 LAB — COMPREHENSIVE METABOLIC PANEL
ALT: 15 U/L (ref 0–44)
AST: 21 U/L (ref 15–41)
Albumin: 2.7 g/dL — ABNORMAL LOW (ref 3.5–5.0)
Alkaline Phosphatase: 69 U/L (ref 38–126)
Anion gap: 10 (ref 5–15)
BUN: 67 mg/dL — ABNORMAL HIGH (ref 8–23)
CO2: 20 mmol/L — ABNORMAL LOW (ref 22–32)
Calcium: 8.4 mg/dL — ABNORMAL LOW (ref 8.9–10.3)
Chloride: 108 mmol/L (ref 98–111)
Creatinine, Ser: 5.57 mg/dL — ABNORMAL HIGH (ref 0.61–1.24)
GFR, Estimated: 10 mL/min — ABNORMAL LOW (ref >60)
Glucose, Bld: 296 mg/dL — ABNORMAL HIGH (ref 70–99)
Potassium: 4.3 mmol/L (ref 3.5–5.1)
Sodium: 138 mmol/L (ref 135–145)
Total Bilirubin: 0.7 mg/dL (ref 0.3–1.2)
Total Protein: 7 g/dL (ref 6.5–8.1)

## 2022-02-26 LAB — CBC
HCT: 37 % — ABNORMAL LOW (ref 39.0–52.0)
Hemoglobin: 11.7 g/dL — ABNORMAL LOW (ref 13.0–17.0)
MCH: 28.5 pg (ref 26.0–34.0)
MCHC: 31.6 g/dL (ref 30.0–36.0)
MCV: 90.2 fL (ref 80.0–100.0)
Platelets: 280 10*3/uL (ref 150–400)
RBC: 4.1 MIL/uL — ABNORMAL LOW (ref 4.22–5.81)
RDW: 14 % (ref 11.5–15.5)
WBC: 10.3 10*3/uL (ref 4.0–10.5)
nRBC: 0 % (ref 0.0–0.2)

## 2022-02-26 LAB — CBG MONITORING, ED: Glucose-Capillary: 272 mg/dL — ABNORMAL HIGH (ref 70–99)

## 2022-02-26 LAB — HEMOGLOBIN A1C
Hgb A1c MFr Bld: 6.4 % — ABNORMAL HIGH (ref 4.8–5.6)
Mean Plasma Glucose: 136.98 mg/dL

## 2022-02-26 LAB — ECHOCARDIOGRAM COMPLETE
AR max vel: 2.1 cm2
AV Area VTI: 2.56 cm2
AV Area mean vel: 1.98 cm2
AV Mean grad: 6 mmHg
AV Peak grad: 9.1 mmHg
Ao pk vel: 1.51 m/s
Area-P 1/2: 4.57 cm2
Height: 70 in
S' Lateral: 3.6 cm
Weight: 5255.77 oz

## 2022-02-26 MED ORDER — INSULIN ASPART 100 UNIT/ML IJ SOLN
8.0000 [IU] | Freq: Three times a day (TID) | INTRAMUSCULAR | Status: DC
  Administered 2022-02-27 – 2022-02-28 (×4): 8 [IU] via SUBCUTANEOUS
  Filled 2022-02-26 (×4): qty 1

## 2022-02-26 MED ORDER — TAMSULOSIN HCL 0.4 MG PO CAPS
0.4000 mg | ORAL_CAPSULE | Freq: Every day | ORAL | Status: DC
  Administered 2022-02-26 – 2022-03-11 (×14): 0.4 mg via ORAL
  Filled 2022-02-26 (×14): qty 1

## 2022-02-26 MED ORDER — AMLODIPINE BESYLATE 10 MG PO TABS
10.0000 mg | ORAL_TABLET | Freq: Every day | ORAL | Status: DC
  Administered 2022-02-27 – 2022-03-11 (×13): 10 mg via ORAL
  Filled 2022-02-26 (×13): qty 1

## 2022-02-26 MED ORDER — PERFLUTREN LIPID MICROSPHERE
1.0000 mL | INTRAVENOUS | Status: AC | PRN
  Administered 2022-02-26: 3 mL via INTRAVENOUS

## 2022-02-26 MED ORDER — POLYETHYLENE GLYCOL 3350 17 G PO PACK
34.0000 g | PACK | Freq: Two times a day (BID) | ORAL | Status: DC
  Administered 2022-02-28 – 2022-03-01 (×3): 34 g via ORAL
  Filled 2022-02-26 (×11): qty 2

## 2022-02-26 MED ORDER — HYDRALAZINE HCL 50 MG PO TABS
50.0000 mg | ORAL_TABLET | Freq: Three times a day (TID) | ORAL | Status: DC
  Administered 2022-02-26 – 2022-03-11 (×36): 50 mg via ORAL
  Filled 2022-02-26 (×37): qty 1

## 2022-02-26 MED ORDER — INSULIN DETEMIR 100 UNIT/ML ~~LOC~~ SOLN
25.0000 [IU] | Freq: Every day | SUBCUTANEOUS | Status: DC
  Administered 2022-02-26 – 2022-02-28 (×3): 25 [IU] via SUBCUTANEOUS
  Filled 2022-02-26 (×3): qty 0.25

## 2022-02-26 NOTE — Assessment & Plan Note (Addendum)
A1c 6.4,   We will increase insulin Levemir to 32 units subcu daily and mealtime NovoLog to 12 units subcu 3 times daily.  Continue sliding scale

## 2022-02-26 NOTE — Progress Notes (Signed)
  PROGRESS NOTE    James Clarke  OEV:[id number] DOB: 11-Oct-1940 DOA: 02/25/2022 PCP: Sofie Hartigan, MD  [id number]  LOS: 0 days   Brief hospital course: No notes on file  Assessment & Plan: James Clarke is a 81 y.o. male with past medical history of chronic kidney disease 4, hypertension, diabetes mellitus type 2, kidney stones, obstructive sleep apnea who presented with abdominal pain.   * Acute renal failure superimposed on stage 4 chronic kidney disease (HCC) --Cr 5.61 on presentation.  Baseline around 2.5. --maybe due to obstruction, from obstructing 2-3 mm calculi within the distal left ureter, or dehydration from home lasix and Valsartan use. --nephro consulted on admission Plan: --urology consult today  Abdominal pain --CT a/p showed Obstructing 2-3 mm calculi within the distal left ureter, however, pt's abdominal pain was across lower abdomen, unlike pt's prior episode of kidney stone pains.  Abdominal pain improved some with BM.  Type 2 diabetes mellitus with renal manifestations (HCC) --A1c 6.4, well controlled.  Hyperglycemia due to not resumed on home insulin Plan: --resume home Levemir at 25u daily --add mealtime 8u TID  Hydronephrosis of left kidney --Obstructing 2-3 mm calculi within the distal left ureter just proximal to the left ureterovesicular junction resulting in mild left hydronephrosis and moderate perinephric stranding. --urology consult today  Chronic diastolic CHF (congestive heart failure) ([place]) --not in acute exacerbation --hold home lasix 2/2 AKI  Left ureteral calculus --urology consult today  Bilateral leg edema Not currently edematous. --hold home lasix 2/2 AKI  Hypertension --hold home lasix and Valsartan due to AKI --cont home Toprol --add hydralazine 50 mg Q8h due to elevated BP   Sleep apnea CPAP per home setting.    DVT prophylaxis: Heparin SQ Code Status: Full code  Family Communication: wife updated at  bedside today Level of care: Med-Surg Dispo:   The patient is from: home Anticipated d/c is to: home Anticipated d/c date is: 2-3 days   Subjective and Interval History:  Pt reported abdominal pain across lower abdomen improved with big BM this morning.     Objective: Vitals:   02/26/22 0800 02/26/22 0931 02/26/22 1528 02/26/22 1939  BP: (!) 152/70 (!) 170/88 (!) 146/62 (!) 151/61  Pulse: 73 82 71 82  Resp: '18 20 18 '$ (!) 22  Temp: 98.7 F (37.1 C) (!) 97.5 F (36.4 C) 97.8 F (36.6 C) 98.2 F (36.8 C)  TempSrc: Oral Oral  Oral  SpO2: 96% 100% 97% 97%  Weight:      Height:        Intake/Output Summary (Last 24 hours) at 02/26/2022 2004 Last data filed at 02/26/2022 0730 Gross per 24 hour  Intake --  Output 1165 ml  Net -1165 ml   Filed Weights   02/25/22 1256  Weight: (!) 149 kg    Examination:   Constitutional: NAD, AAOx3 HEENT: conjunctivae and lids normal, EOMI CV: No cyanosis.   RESP: normal respiratory effort, on RA Extremities: No effusions, edema in BLE SKIN: warm, dry Neuro: II - XII grossly intact.   Psych: Normal mood and affect.  Appropriate judgement and reason   Data Reviewed: I have personally reviewed labs and imaging studies  Time spent: 50 minutes  Enzo Bi, MD [facility] If 7PM-7AM, please contact night-coverage 02/26/2022, 8:04 PM

## 2022-02-26 NOTE — Assessment & Plan Note (Addendum)
   not in acute exacerbation  hold home lasix 2/2 AKI  Echo done this admission  Planning for dialysis may help fluid status he appears very slightly overloaded

## 2022-02-26 NOTE — Assessment & Plan Note (Addendum)
   hold home lasix and Valsartan due to AKI  cont home Toprol  add hydralazine 50 mg Q8h due to elevated BP.  As needed hydralazine for systolic blood pressure more than 160

## 2022-02-26 NOTE — Assessment & Plan Note (Addendum)
   Improved with dialysis

## 2022-02-26 NOTE — Assessment & Plan Note (Addendum)
Obstructing 2-3 mm calculi within the distal left ureter just proximal to the left ureterovesicular junction resulting in mild left hydronephrosis and moderate perinephric stranding.  urology L ureteroscopy 08/09  I have added oxybutynin 5 mg p.o. 3 times daily as needed.  Continue Flomax

## 2022-02-26 NOTE — Progress Notes (Signed)
[facility]  ROUNDING NOTE   Subjective:   James Clarke is a 81 year old male with past medical conditions including Bell's palsy, diabetes, hypertension, hyperlipidemia, sleep apnea on CPAP, and chronic kidney disease stage IV.  Patient presents to the emergency department at the advice of his nephrologist for abnormal renal function.  Patient has been admitted for Bilateral flank pain [R10.9] Suprapubic abdominal pain [R10.2] Abdominal pain [R10.9] Acute renal failure superimposed on chronic kidney disease, unspecified CKD stage, unspecified acute renal failure type ([place]) [N17.9, N18.9] AKI (acute kidney injury) ([place]) [N17.9]  Patient is known to our practice and receives follow-up care by Dr. Holley Raring.  Patient states he began to have abdominal pain on Friday.  Made attempts to get into see nephrology and urology without appointment available.  Patient was recommended at that time to proceed to emergency department however he refused.  Patient continued to have pain throughout weekend and on Monday called nephrology and urology again for appointment.  No appointments were available.  Patient instructed to go to urgent care.  Urgent care contacted nephrology, Dr. Holley Raring, to inform him of worsening renal function.  Patient recommended and willing to follow-up at emergency department at that time.  Denies nausea, vomiting, diarrhea, chest pain, fever, chills, and shortness of breath.  Labs on presentation include BUN 80, creatinine 5.61 with GFR 10, BNP 401.9, WBCs 13.3, and hemoglobin 11.3.  UA with glucose, minimal and hematuria, proteinuria. Abdominal x-ray negative.  Renal ultrasound negative for hydronephrosis, however CT abdomen pelvis shows obstructing 2 to 3 mm calculi on the distal left ureter.  We have been consulted to evaluate acute kidney injury.   Objective:  Vital signs in last 24 hours:  Temp:  [97.5 F (36.4 C)-98.7 F (37.1 C)] 97.8 F (36.6 C) (08/08  1528) Pulse Rate:  [71-82] 71 (08/08 1528) Resp:  [16-24] 18 (08/08 1528) BP: (146-170)/(62-88) 146/62 (08/08 1528) SpO2:  [92 %-100 %] 97 % (08/08 1528)  Weight change:  Filed Weights   02/25/22 1256  Weight: (!) 149 kg    Intake/Output: I/O last 3 completed shifts: In: -  Out: 1395 [Urine:1395]   Intake/Output this shift:  Total I/O In: -  Out: 35 [Urine:35]  Physical Exam: General: NAD  Head: Normocephalic, atraumatic. Moist oral mucosal membranes  Eyes: Anicteric  Lungs:  Clear to auscultation, normal effort, room air  Heart: Regular rate and rhythm  Abdomen:  Soft, nontender, obese  Extremities: Trace peripheral edema.  Neurologic: Nonfocal, moving all four extremities  Skin: No lesions  Access: None    Basic Metabolic Panel: Recent Labs  Lab 02/25/22 1005 02/25/22 1316 02/26/22 0948  NA 135  --  138  K 3.9  --  4.3  CL 105  --  108  CO2 23  --  20*  GLUCOSE 118*  --  296*  BUN 80*  --  67*  CREATININE 5.61*  --  5.57*  CALCIUM 8.4*  --  8.4*  MG  --  2.2  --   PHOS  --  5.4*  --     Liver Function Tests: Recent Labs  Lab 02/25/22 1005 02/26/22 0948  AST 19 21  ALT 14 15  ALKPHOS 71 69  BILITOT 0.5 0.7  PROT 7.2 7.0  ALBUMIN 2.8* 2.7*   No results for input(s): "LIPASE", "AMYLASE" in the last 168 hours. No results for input(s): "AMMONIA" in the last 168 hours.  CBC: Recent Labs  Lab 02/25/22 1316 02/26/22 0948  WBC 13.3* 10.3  NEUTROABS 9.9*  --   HGB 11.3* 11.7*  HCT 36.2* 37.0*  MCV 91.4 90.2  PLT 290 280    Cardiac Enzymes: No results for input(s): "CKTOTAL", "CKMB", "CKMBINDEX", "TROPONINI" in the last 168 hours.  BNP: Invalid input(s): "POCBNP"  CBG: Recent Labs  Lab 02/25/22 1509 02/25/22 1557 02/25/22 2046 02/26/22 0839 02/26/22 1205  GLUCAP 43* 96 215* 272* 256*    Microbiology: Results for orders placed or performed during the hospital encounter of 09/08/20  SARS CORONAVIRUS 2 (TAT 6-24 HRS)  Nasopharyngeal Nasopharyngeal Swab     Status: None   Collection Time: 09/08/20 11:54 AM   Specimen: Nasopharyngeal Swab  Result Value Ref Range Status   SARS Coronavirus 2 NEGATIVE NEGATIVE Final    Comment: (NOTE) SARS-CoV-2 target nucleic acids are NOT DETECTED.  The SARS-CoV-2 RNA is generally detectable in upper and lower respiratory specimens during the acute phase of infection. Negative results do not preclude SARS-CoV-2 infection, do not rule out co-infections with other pathogens, and should not be used as the sole basis for treatment or other patient management decisions. Negative results must be combined with clinical observations, patient history, and epidemiological information. The expected result is Negative.  Fact Sheet for Patients: SugarRoll.be  Fact Sheet for Healthcare Providers: https://www.woods-mathews.com/  This test is not yet approved or cleared by the [place] FDA and  has been authorized for detection and/or diagnosis of SARS-CoV-2 by FDA under an Emergency Use Authorization (EUA). This EUA will remain  in effect (meaning this test can be used) for the duration of the COVID-19 declaration under Se ction 564(b)(1) of the Act, 21 U.S.C. section 360bbb-3(b)(1), unless the authorization is terminated or revoked sooner.  Performed at [facility], [place] [address] [place], [place] [address]     Coagulation Studies: No results for input(s): "LABPROT", "INR" in the last 72 hours.  Urinalysis: Recent Labs    02/25/22 0947  COLORURINE YELLOW  LABSPEC 1.020  PHURINE 5.5  GLUCOSEU 250*  HGBUR SMALL*  BILIRUBINUR NEGATIVE  KETONESUR NEGATIVE  PROTEINUR >300*  NITRITE NEGATIVE  LEUKOCYTESUR NEGATIVE      Imaging: ECHOCARDIOGRAM COMPLETE  Result Date: 02/26/2022    ECHOCARDIOGRAM REPORT   Patient Name:   James Clarke Date of Exam: 02/26/2022 Medical Rec #:  [id number]       Height:       70.0 in  Accession #:    [id number]      Weight:       328.5 lb Date of Birth:  02-09-1941      BSA:          2.577 m Patient Age:    54 years        BP:           156/72 mmHg Patient Gender: M               HR:           77 bpm. Exam Location:  [facility] Procedure: 2D Echo, Color Doppler, Cardiac Doppler and Intracardiac            Opacification Agent Indications:     CHF-acute systolic A25.05  History:         Patient has prior history of Echocardiogram examinations, most                  recent 11/26/2016. Signs/Symptoms:Dyspnea; Risk Factors:Diabetes,  Hypertension and Sleep Apnea.  Sonographer:     Sherrie Sport Referring Phys:  [place] Diagnosing Phys: Yolonda Kida MD  Sonographer Comments: Technically challenging study due to limited acoustic windows, suboptimal apical window and no subcostal window. IMPRESSIONS  1. Left ventricular ejection fraction, by estimation, is 55 to 60%. The left ventricle has normal function. The left ventricle has no regional wall motion abnormalities. The left ventricular internal cavity size was mildly dilated. Left ventricular diastolic parameters are consistent with Grade II diastolic dysfunction (pseudonormalization).  2. Right ventricular systolic function is normal. The right ventricular size is normal.  3. Left atrial size was mildly dilated.  4. Right atrial size was mildly dilated.  5. The mitral valve is normal in structure. Trivial mitral valve regurgitation.  6. The aortic valve is grossly normal. Aortic valve regurgitation is not visualized. FINDINGS  Left Ventricle: Left ventricular ejection fraction, by estimation, is 55 to 60%. The left ventricle has normal function. The left ventricle has no regional wall motion abnormalities. Definity contrast agent was given IV to delineate the left ventricular  endocardial borders. The left ventricular internal cavity size was mildly dilated. There is no left ventricular hypertrophy. Left ventricular diastolic  parameters are consistent with Grade II diastolic dysfunction (pseudonormalization). Right Ventricle: The right ventricular size is normal. No increase in right ventricular wall thickness. Right ventricular systolic function is normal. Left Atrium: Left atrial size was mildly dilated. Right Atrium: Right atrial size was mildly dilated. Pericardium: There is no evidence of pericardial effusion. Mitral Valve: The mitral valve is normal in structure. Trivial mitral valve regurgitation. Tricuspid Valve: The tricuspid valve is normal in structure. Tricuspid valve regurgitation is trivial. Aortic Valve: The aortic valve is grossly normal. Aortic valve regurgitation is not visualized. Aortic valve mean gradient measures 6.0 mmHg. Aortic valve peak gradient measures 9.1 mmHg. Aortic valve area, by VTI measures 2.56 cm. Pulmonic Valve: The pulmonic valve was normal in structure. Pulmonic valve regurgitation is not visualized. Aorta: The ascending aorta was not well visualized. IAS/Shunts: No atrial level shunt detected by color flow Doppler.  LEFT VENTRICLE PLAX 2D LVIDd:         5.10 cm   Diastology LVIDs:         3.60 cm   LV e' medial:    2.94 cm/s LV PW:         1.50 cm   LV E/e' medial:  27.8 LV IVS:        1.50 cm   LV e' lateral:   6.64 cm/s LVOT diam:     2.00 cm   LV E/e' lateral: 12.3 LV SV:         65 LV SV Index:   25 LVOT Area:     3.14 cm  RIGHT VENTRICLE RV Basal diam:  4.50 cm RV S prime:     12.30 cm/s TAPSE (M-mode): 3.4 cm LEFT ATRIUM              Index        RIGHT ATRIUM           Index LA diam:        4.10 cm  1.59 cm/m   RA Area:     33.70 cm LA Vol (A2C):   114.0 ml 44.23 ml/m  RA Volume:   123.00 ml 47.72 ml/m LA Vol (A4C):   123.0 ml 47.72 ml/m LA Biplane Vol: 119.0 ml 46.17 ml/m  AORTIC VALVE AV Area (  Vmax):    2.10 cm AV Area (Vmean):   1.98 cm AV Area (VTI):     2.56 cm AV Vmax:           151.00 cm/s AV Vmean:          110.000 cm/s AV VTI:            0.253 m AV Peak Grad:      9.1 mmHg  AV Mean Grad:      6.0 mmHg LVOT Vmax:         101.00 cm/s LVOT Vmean:        69.200 cm/s LVOT VTI:          0.206 m LVOT/AV VTI ratio: 0.81  AORTA Ao Root diam: 4.07 cm MITRAL VALVE               TRICUSPID VALVE MV Area (PHT): 4.57 cm    TR Peak grad:   7.6 mmHg MV Decel Time: 166 msec    TR Vmax:        138.00 cm/s MV E velocity: 81.80 cm/s MV A velocity: 53.60 cm/s  SHUNTS MV E/A ratio:  1.53        Systemic VTI:  0.21 m                            Systemic Diam: 2.00 cm Dwayne Prince [place] MD Electronically signed by Yolonda Kida MD Signature Date/Time: 02/26/2022/1:51:02 PM    Final    CT ABDOMEN PELVIS WO CONTRAST  Result Date: 02/26/2022 CLINICAL DATA:  Abdominal pain, acute, nonlocalized abd pain/ flank pain/acute kidney injury. EXAM: CT ABDOMEN AND PELVIS WITHOUT CONTRAST TECHNIQUE: Multidetector CT imaging of the abdomen and pelvis was performed following the standard protocol without IV contrast. RADIATION DOSE REDUCTION: This exam was performed according to the departmental dose-optimization program which includes automated exposure control, adjustment of the mA and/or kV according to patient size and/or use of iterative reconstruction technique. COMPARISON:  07/26/2019 FINDINGS: Lower chest: Mildly progressive subpleural bibasilar pulmonary fibrotic change. Extensive coronary artery calcification. Global cardiac size within normal limits. Stable trace pericardial effusion. Hepatobiliary: No focal liver abnormality is seen. Status post cholecystectomy. No biliary dilatation. Pancreas: Unremarkable Spleen: Unremarkable Adrenals/Urinary Tract: The adrenal glands are unremarkable. The kidneys are normal in size and position. There is mild left hydronephrosis and moderate perinephric stranding secondary to 2 obstructing calculi measuring 2-3 mm in size within the distal left ureter just proximal to the left ureterovesicular junction. There is superimposed bilateral nonobstructing nephrolithiasis with  calculi measuring up to 10 mm in greatest dimension bilaterally. No ureteral calculi on the right. No hydronephrosis on the right. Simple exophytic cortical cyst arises from the upper pole of the right kidney. No follow-up imaging is recommended for this lesion. The bladder is unremarkable. Stomach/Bowel: Moderate sigmoid diverticulosis without superimposed acute inflammatory change. Stomach, small bowel, and large bowel are otherwise unremarkable. Appendix absent. No free intraperitoneal gas or fluid. Vascular/Lymphatic: Mild aortoiliac atherosclerotic calcification. No aortic aneurysm. No pathologic adenopathy within the abdomen and pelvis. Reproductive: Mild prostatic hypertrophy. Other: Small fat containing umbilical and bilateral inguinal hernias. Rectum unremarkable. Musculoskeletal: No acute bone abnormality. No lytic or blastic bone lesion. IMPRESSION: 1. Obstructing 2-3 mm calculi within the distal left ureter just proximal to the left ureterovesicular junction resulting in mild left hydronephrosis and moderate perinephric stranding. 2. Superimposed moderate bilateral nonobstructing nephrolithiasis. 3. Moderate sigmoid diverticulosis without superimposed acute inflammatory  change. 4. Extensive coronary artery calcification. 5. Mildly progressive bibasilar pulmonary fibrosis. Aortic Atherosclerosis (ICD10-I70.0). Electronically Signed   By: Fidela Salisbury M.D.   On: 02/26/2022 02:34   US Renal  Result Date: 02/25/2022 CLINICAL DATA:  Worsening renal function. EXAM: RENAL / URINARY TRACT ULTRASOUND COMPLETE COMPARISON:  None Available. FINDINGS: Right Kidney: Renal measurements: 12.1 x 5.7 x 4.9 cm = volume: 175 mL. Mild renal cortical thinning and increased echogenicity. No hydronephrosis. Multiple cysts within the right kidney measuring up to 4.2 cm. Left Kidney: Renal measurements: 12.3 x 6.4 x 5.5 cm = volume: 225 mL. Renal cortical thinning. No hydronephrosis. No renal mass. Bladder: Appears normal  for degree of bladder distention. Other: None. IMPRESSION: No hydronephrosis. Mild renal cortical thinning bilaterally, raising the possibility of chronic medical renal disease. Electronically Signed   By: Lovey Newcomer M.D.   On: 02/25/2022 15:21   DG Abdomen 1 View  Result Date: 02/25/2022 CLINICAL DATA:  Abdominal pain and dysuria. Urinary tract infection symptoms. EXAM: ABDOMEN - 1 VIEW COMPARISON:  Abdominal radiograph 02/19/2021 FINDINGS: Redemonstrated opacities within the lung base, nonspecific. Nonobstructed bowel-gas pattern. Cholecystectomy clips. Right hemipelvis phlebolith. Lumbar spine degenerative changes. Stool throughout the colon. IMPRESSION: Nonobstructed bowel-gas pattern. No definite radiodensities to suggest nephrolithiasis. Electronically Signed   By: Lovey Newcomer M.D.   On: 02/25/2022 10:24     Medications:     amLODipine  5 mg Oral Daily   aspirin EC  81 mg Oral Daily   heparin  5,000 Units Subcutaneous Q8H   insulin aspart  0-15 Units Subcutaneous TID WC   metoprolol succinate  50 mg Oral Daily   polyethylene glycol  34 g Oral BID   sodium chloride flush  3 mL Intravenous Q12H   acetaminophen **OR** acetaminophen, albuterol, hydrALAZINE, HYDROcodone-acetaminophen  Assessment/ Plan:  James Clarke is a 81 y.o.  male with past medical conditions including Bell's palsy, diabetes, hypertension, hyperlipidemia, sleep apnea on CPAP, and chronic kidney disease stage IV.  Patient presents to the emergency department at the advice of his nephrologist for abnormal renal function.  Patient has been admitted for Bilateral flank pain [R10.9] Suprapubic abdominal pain [R10.2] Abdominal pain [R10.9] Acute renal failure superimposed on chronic kidney disease, unspecified CKD stage, unspecified acute renal failure type ([place]) [N17.9, N18.9] AKI (acute kidney injury) ([place]) [N17.9]   Acute Kidney Injury on chronic kidney disease stage IV with baseline creatinine 3.3 and GFR of 18  on 02/19/22.  Acute kidney injury secondary to 2 to 3 mm obstructive stone seen on CT abdomen pelvis.  Agree with urology consult for management of stone.  Expect kidney function to improve with resolution.  Will continue to monitor.  Continue to avoid nephrotoxic agents and therapies.   Lab Results  Component Value Date   CREATININE 5.57 (H) 02/26/2022   CREATININE 5.61 (H) 02/25/2022   CREATININE 2.35 (H) 10/17/2020    Intake/Output Summary (Last 24 hours) at 02/26/2022 1623 Last data filed at 02/26/2022 0730 Gross per 24 hour  Intake --  Output 1430 ml  Net -1430 ml   2.  Hypertension with chronic kidney disease.  Home regimen includes amlodipine, furosemide, metoprolol, and valsartan.  Valsartan and furosemide held in place of kidney injury.  3. Diabetes mellitus type II with chronic kidney disease. insulin dependent. Home regimen includes NovoLog and Levemir. Most recent hemoglobin A1c is 6.4 on 02/722.   4.  Chronic diastolic heart failure.  EF completed on 02/26/2022 shows EF 55 to  60% with a grade 2 diastolic dysfunction, bilateral mildly dilated atrial's, and mildly dilated left ventricle.   LOS: 0 Doniqua Saxby 8/8/20234:23 PM

## 2022-02-26 NOTE — H&P (View-Only) (Signed)
Urology Consult  Requesting physician: Enzo Bi, MD  Reason for consultation: Left distal ureteral calculus with AKI  Chief Complaint: Abdominal pain  History of Present Illness: James Clarke is a 81 y.o. stage IV chronic kidney disease, diabetes, hypertension seen in urgent care 02/25/2022 for bilateral back pain and abdominal pain and referred to the ED for further evaluation after kidney function elevated above baseline.  Creatinine was 5.61 with baseline creatinine in the 2 range.  Urinalysis was unremarkable.  Creatinine this morning stable 5.57.  Renal ultrasound admission was unremarkable however a stone protocol CT performed today showed mild left hydronephrosis and 2 distal ureteral calculi measuring 2-3 mm.  Prior history of stone disease followed by Dr. Diamantina [place].  He underwent urgent stent placement 07/25/2021 for a right UPJ calculus and AKI.  Had follow-up ureteroscopy performed January 2021.  He last saw Dr. Sninsky/11/23 and KUB showed no urinary tract calculi.  Denies fever, chills, nausea, vomiting  Past Medical History:  Diagnosis Date   Asthma    Bell's palsy    SEVERAL TIMES   Bell's palsy    Cancer ([place])    SKIN   Diabetes mellitus without complication ([place])    Dyspnea    DOE   History of kidney stones    Hyperlipemia    Hypertension    Kidney stone    Neuropathy    Obesity    Sleep apnea    C PAP    Past Surgical History:  Procedure Laterality Date   APPENDECTOMY     CATARACT EXTRACTION W/PHACO Left 05/20/2017   Procedure: CATARACT EXTRACTION PHACO AND INTRAOCULAR LENS PLACEMENT ([place]);  Surgeon: Birder Robson, MD;  Location: [facility];  Service: Ophthalmology;  Laterality: Left;  [place] 00:32.0 AP% 15.5 CDE 4.97 Fluid Pack Lot # [id number]   CATARACT EXTRACTION W/PHACO Right 06/10/2017   Procedure: CATARACT EXTRACTION PHACO AND INTRAOCULAR LENS PLACEMENT ([facility]);  Surgeon: Birder Robson, MD;  Location: [facility];  Service: Ophthalmology;  Laterality:  Right;  [place]  00:51 AP% 15.4 CDE 7.95 Fluid pack lot # [id number]   CHOLECYSTECTOMY     CYSTOSCOPY W/ RETROGRADES Right 08/13/2019   Procedure: CYSTOSCOPY WITH RETROGRADE PYELOGRAM;  Surgeon: Billey Co, MD;  Location: [facility];  Service: Urology;  Laterality: Right;   CYSTOSCOPY WITH STENT PLACEMENT Right 07/26/2019   Procedure: CYSTOSCOPY WITH STENT PLACEMENT;  Surgeon: Lucas Mallow, MD;  Location: [facility];  Service: Urology;  Laterality: Right;   CYSTOSCOPY/URETEROSCOPY/HOLMIUM LASER/STENT PLACEMENT Right 08/13/2019   Procedure: CYSTOSCOPY/URETEROSCOPY/HOLMIUM LASER/STENT EXCHANGE;  Surgeon: Billey Co, MD;  Location: [facility];  Service: Urology;  Laterality: Right;   FRACTURE SURGERY     WRIST   HAND SURGERY     HERNIA REPAIR     LEFT HEART CATH AND CORONARY ANGIOGRAPHY N/A 12/31/2021   Procedure: LEFT HEART CATH AND CORONARY ANGIOGRAPHY;  Surgeon: Corey Skains, MD;  Location: [facility];  Service: Cardiovascular;  Laterality: N/A;   SHOULDER ARTHROSCOPY WITH SUBACROMIAL DECOMPRESSION AND OPEN ROTATOR C Right 09/11/2020   Procedure: Right shoulder arthroscopic rotator cuff repair vs Regeneten patch application, subacromial decompression, and biceps tenodesis - Reche Dixon to Assist;  Surgeon: Leim Fabry, MD;  Location: [facility];  Service: Orthopedics;  Laterality: Right;   TONSILLECTOMY      Home Medications:  Current Meds  Medication Sig   amLODipine (NORVASC) 5 MG tablet Take 5 mg by mouth daily.   aspirin EC 81 MG tablet Take by  mouth.   calcitRIOL (ROCALTROL) 0.25 MCG capsule Take 0.25 mcg by mouth daily.   clotrimazole (LOTRIMIN) 1 % cream Apply 1 Application topically daily as needed.   furosemide (LASIX) 40 MG tablet Take 40 mg by mouth 3 (three) times a week.   insulin aspart (NOVOLOG) 100 UNIT/ML injection Inject 30 units before breakfast and lunch. Inject 55 units before supper.   insulin detemir (LEVEMIR) 100 UNIT/ML injection Inject 50 mLs into  the skin daily.   lovastatin (MEVACOR) 40 MG tablet Take 40 mg by mouth daily with supper.   metoprolol succinate (TOPROL-XL) 50 MG 24 hr tablet Take 50 mg by mouth daily.   potassium chloride SA (K-DUR,KLOR-CON) 20 MEQ tablet Take 20 mEq by mouth daily.   valsartan (DIOVAN) 320 MG tablet     Allergies:  Allergies  Allergen Reactions   Codeine Nausea And Vomiting   Doxycycline    Erythromycin Rash    Family History  Problem Relation Age of Onset   Emphysema Mother    COPD Mother    Heart disease Mother    Brain cancer Father     Social History:  reports that he has never smoked. He has never been exposed to tobacco smoke. He has never used smokeless tobacco. He reports that he does not currently use drugs. He reports that he does not drink alcohol.  ROS: A complete review of systems was performed.  All systems are negative except for pertinent findings as noted.  Physical Exam:  Vital signs in last 24 hours: Temp:  [97.5 F (36.4 C)-98.7 F (37.1 C)] 97.8 F (36.6 C) (08/08 1528) Pulse Rate:  [71-82] 71 (08/08 1528) Resp:  [16-24] 18 (08/08 1528) BP: (146-170)/(62-88) 146/62 (08/08 1528) SpO2:  [92 %-100 %] 97 % (08/08 1528) Constitutional:  Alert and oriented, No acute distress HEENT:  AT, moist mucus membranes.  Trachea midline, no masses Cardiovascular: Regular rate and rhythm, no clubbing, cyanosis, or edema. Respiratory: Normal respiratory effort, lungs clear bilaterally GI: Abdomen is soft, nontender, nondistended, no abdominal masses Psychiatric: Normal mood and affect   Laboratory Data:  Recent Labs    02/25/22 1316 02/26/22 0948  WBC 13.3* 10.3  HGB 11.3* 11.7*  HCT 36.2* 37.0*   Recent Labs    02/25/22 1005 02/26/22 0948  NA 135 138  K 3.9 4.3  CL 105 108  CO2 23 20*  GLUCOSE 118* 296*  BUN 80* 67*  CREATININE 5.61* 5.57*  CALCIUM 8.4* 8.4*    Radiologic Imaging: CT images were personally reviewed and interpreted  ECHOCARDIOGRAM  COMPLETE  Result Date: 02/26/2022    ECHOCARDIOGRAM REPORT   Patient Name:   James Clarke Date of Exam: 02/26/2022 Medical Rec #:  [id number]       Height:       70.0 in Accession #:    [id number]      Weight:       328.5 lb Date of Birth:  20-Jul-1941      BSA:          2.577 m Patient Age:    22 years        BP:           156/72 mmHg Patient Gender: M               HR:           77 bpm. Exam Location:  [facility] Procedure: 2D Echo, Color Doppler, Cardiac Doppler and Intracardiac  Opacification Agent Indications:     CHF-acute systolic G64.40  History:         Patient has prior history of Echocardiogram examinations, most                  recent 11/26/2016. Signs/Symptoms:Dyspnea; Risk Factors:Diabetes,                  Hypertension and Sleep Apnea.  Sonographer:     Sherrie Sport Referring Phys:  [place] Diagnosing Phys: Yolonda Kida MD  Sonographer Comments: Technically challenging study due to limited acoustic windows, suboptimal apical window and no subcostal window. IMPRESSIONS  1. Left ventricular ejection fraction, by estimation, is 55 to 60%. The left ventricle has normal function. The left ventricle has no regional wall motion abnormalities. The left ventricular internal cavity size was mildly dilated. Left ventricular diastolic parameters are consistent with Grade II diastolic dysfunction (pseudonormalization).  2. Right ventricular systolic function is normal. The right ventricular size is normal.  3. Left atrial size was mildly dilated.  4. Right atrial size was mildly dilated.  5. The mitral valve is normal in structure. Trivial mitral valve regurgitation.  6. The aortic valve is grossly normal. Aortic valve regurgitation is not visualized. FINDINGS  Left Ventricle: Left ventricular ejection fraction, by estimation, is 55 to 60%. The left ventricle has normal function. The left ventricle has no regional wall motion abnormalities. Definity contrast agent was given IV to delineate the  left ventricular  endocardial borders. The left ventricular internal cavity size was mildly dilated. There is no left ventricular hypertrophy. Left ventricular diastolic parameters are consistent with Grade II diastolic dysfunction (pseudonormalization). Right Ventricle: The right ventricular size is normal. No increase in right ventricular wall thickness. Right ventricular systolic function is normal. Left Atrium: Left atrial size was mildly dilated. Right Atrium: Right atrial size was mildly dilated. Pericardium: There is no evidence of pericardial effusion. Mitral Valve: The mitral valve is normal in structure. Trivial mitral valve regurgitation. Tricuspid Valve: The tricuspid valve is normal in structure. Tricuspid valve regurgitation is trivial. Aortic Valve: The aortic valve is grossly normal. Aortic valve regurgitation is not visualized. Aortic valve mean gradient measures 6.0 mmHg. Aortic valve peak gradient measures 9.1 mmHg. Aortic valve area, by VTI measures 2.56 cm. Pulmonic Valve: The pulmonic valve was normal in structure. Pulmonic valve regurgitation is not visualized. Aorta: The ascending aorta was not well visualized. IAS/Shunts: No atrial level shunt detected by color flow Doppler.  LEFT VENTRICLE PLAX 2D LVIDd:         5.10 cm   Diastology LVIDs:         3.60 cm   LV e' medial:    2.94 cm/s LV PW:         1.50 cm   LV E/e' medial:  27.8 LV IVS:        1.50 cm   LV e' lateral:   6.64 cm/s LVOT diam:     2.00 cm   LV E/e' lateral: 12.3 LV SV:         65 LV SV Index:   25 LVOT Area:     3.14 cm  RIGHT VENTRICLE RV Basal diam:  4.50 cm RV S prime:     12.30 cm/s TAPSE (M-mode): 3.4 cm LEFT ATRIUM              Index        RIGHT ATRIUM  Index LA diam:        4.10 cm  1.59 cm/m   RA Area:     33.70 cm LA Vol (A2C):   114.0 ml 44.23 ml/m  RA Volume:   123.00 ml 47.72 ml/m LA Vol (A4C):   123.0 ml 47.72 ml/m LA Biplane Vol: 119.0 ml 46.17 ml/m  AORTIC VALVE AV Area (Vmax):    2.10 cm AV  Area (Vmean):   1.98 cm AV Area (VTI):     2.56 cm AV Vmax:           151.00 cm/s AV Vmean:          110.000 cm/s AV VTI:            0.253 m AV Peak Grad:      9.1 mmHg AV Mean Grad:      6.0 mmHg LVOT Vmax:         101.00 cm/s LVOT Vmean:        69.200 cm/s LVOT VTI:          0.206 m LVOT/AV VTI ratio: 0.81  AORTA Ao Root diam: 4.07 cm MITRAL VALVE               TRICUSPID VALVE MV Area (PHT): 4.57 cm    TR Peak grad:   7.6 mmHg MV Decel Time: 166 msec    TR Vmax:        138.00 cm/s MV E velocity: 81.80 cm/s MV A velocity: 53.60 cm/s  SHUNTS MV E/A ratio:  1.53        Systemic VTI:  0.21 m                            Systemic Diam: 2.00 cm Dwayne Prince [place] MD Electronically signed by Yolonda Kida MD Signature Date/Time: 02/26/2022/1:51:02 PM    Final    CT ABDOMEN PELVIS WO CONTRAST  Result Date: 02/26/2022 CLINICAL DATA:  Abdominal pain, acute, nonlocalized abd pain/ flank pain/acute kidney injury. EXAM: CT ABDOMEN AND PELVIS WITHOUT CONTRAST TECHNIQUE: Multidetector CT imaging of the abdomen and pelvis was performed following the standard protocol without IV contrast. RADIATION DOSE REDUCTION: This exam was performed according to the departmental dose-optimization program which includes automated exposure control, adjustment of the mA and/or kV according to patient size and/or use of iterative reconstruction technique. COMPARISON:  07/26/2019 FINDINGS: Lower chest: Mildly progressive subpleural bibasilar pulmonary fibrotic change. Extensive coronary artery calcification. Global cardiac size within normal limits. Stable trace pericardial effusion. Hepatobiliary: No focal liver abnormality is seen. Status post cholecystectomy. No biliary dilatation. Pancreas: Unremarkable Spleen: Unremarkable Adrenals/Urinary Tract: The adrenal glands are unremarkable. The kidneys are normal in size and position. There is mild left hydronephrosis and moderate perinephric stranding secondary to 2 obstructing calculi  measuring 2-3 mm in size within the distal left ureter just proximal to the left ureterovesicular junction. There is superimposed bilateral nonobstructing nephrolithiasis with calculi measuring up to 10 mm in greatest dimension bilaterally. No ureteral calculi on the right. No hydronephrosis on the right. Simple exophytic cortical cyst arises from the upper pole of the right kidney. No follow-up imaging is recommended for this lesion. The bladder is unremarkable. Stomach/Bowel: Moderate sigmoid diverticulosis without superimposed acute inflammatory change. Stomach, small bowel, and large bowel are otherwise unremarkable. Appendix absent. No free intraperitoneal gas or fluid. Vascular/Lymphatic: Mild aortoiliac atherosclerotic calcification. No aortic aneurysm. No pathologic adenopathy within the abdomen and pelvis. Reproductive: Mild prostatic  hypertrophy. Other: Small fat containing umbilical and bilateral inguinal hernias. Rectum unremarkable. Musculoskeletal: No acute bone abnormality. No lytic or blastic bone lesion. IMPRESSION: 1. Obstructing 2-3 mm calculi within the distal left ureter just proximal to the left ureterovesicular junction resulting in mild left hydronephrosis and moderate perinephric stranding. 2. Superimposed moderate bilateral nonobstructing nephrolithiasis. 3. Moderate sigmoid diverticulosis without superimposed acute inflammatory change. 4. Extensive coronary artery calcification. 5. Mildly progressive bibasilar pulmonary fibrosis. Aortic Atherosclerosis (ICD10-I70.0). Electronically Signed   By: Fidela Salisbury M.D.   On: 02/26/2022 02:34   US Renal  Result Date: 02/25/2022 CLINICAL DATA:  Worsening renal function. EXAM: RENAL / URINARY TRACT ULTRASOUND COMPLETE COMPARISON:  None Available. FINDINGS: Right Kidney: Renal measurements: 12.1 x 5.7 x 4.9 cm = volume: 175 mL. Mild renal cortical thinning and increased echogenicity. No hydronephrosis. Multiple cysts within the right kidney  measuring up to 4.2 cm. Left Kidney: Renal measurements: 12.3 x 6.4 x 5.5 cm = volume: 225 mL. Renal cortical thinning. No hydronephrosis. No renal mass. Bladder: Appears normal for degree of bladder distention. Other: None. IMPRESSION: No hydronephrosis. Mild renal cortical thinning bilaterally, raising the possibility of chronic medical renal disease. Electronically Signed   By: Lovey Newcomer M.D.   On: 02/25/2022 15:21   DG Abdomen 1 View  Result Date: 02/25/2022 CLINICAL DATA:  Abdominal pain and dysuria. Urinary tract infection symptoms. EXAM: ABDOMEN - 1 VIEW COMPARISON:  Abdominal radiograph 02/19/2021 FINDINGS: Redemonstrated opacities within the lung base, nonspecific. Nonobstructed bowel-gas pattern. Cholecystectomy clips. Right hemipelvis phlebolith. Lumbar spine degenerative changes. Stool throughout the colon. IMPRESSION: Nonobstructed bowel-gas pattern. No definite radiodensities to suggest nephrolithiasis. Electronically Signed   By: Lovey Newcomer M.D.   On: 02/25/2022 10:24    Impression/Assessment:  81 y.o. male with stage IV chronic kidney disease with creatinine ~ 5.5.  He has distal ureteral calculi x2 with mild left hydronephrosis.  Given his CKD the calculi are the most likely source of his AKI  Recommendation:  Strain urine Start tamsulosin 0.4 mg Will add on for left ureteroscopy with stone removal/stent placement 02/27/2022 if he does not pass the stone in the interim The procedure was discussed including potential risks of bleeding, infection and ureteral injury.  The need for a postoperative stent was also discussed All questions were answered and he desires to proceed with stone removal   02/26/2022, 6:09 PM  John Giovanni,  MD

## 2022-02-26 NOTE — Assessment & Plan Note (Addendum)
   Urology: s/p L ureteroscopy 08/09.  Outpatient urology follow-up.  Continue Flomax and add oxybutynin 5 mg p.o. 3 times daily as needed

## 2022-02-26 NOTE — Consult Note (Signed)
Urology Consult  Requesting physician: Enzo Bi, MD  Reason for consultation: Left distal ureteral calculus with AKI  Chief Complaint: Abdominal pain  History of Present Illness: James Clarke is a 81 y.o. stage IV chronic kidney disease, diabetes, hypertension seen in urgent care 02/25/2022 for bilateral back pain and abdominal pain and referred to the ED for further evaluation after kidney function elevated above baseline.  Creatinine was 5.61 with baseline creatinine in the 2 range.  Urinalysis was unremarkable.  Creatinine this morning stable 5.57.  Renal ultrasound admission was unremarkable however a stone protocol CT performed today showed mild left hydronephrosis and 2 distal ureteral calculi measuring 2-3 mm.  Prior history of stone disease followed by Dr. Diamantina Providence.  He underwent urgent stent placement 07/25/2021 for a right UPJ calculus and AKI.  Had follow-up ureteroscopy performed January 2021.  He last saw Dr. Sninsky/11/23 and KUB showed no urinary tract calculi.  Denies fever, chills, nausea, vomiting  Past Medical History:  Diagnosis Date   Asthma    Bell's palsy    SEVERAL TIMES   Bell's palsy    Cancer ([place])    SKIN   Diabetes mellitus without complication ([place])    Dyspnea    DOE   History of kidney stones    Hyperlipemia    Hypertension    Kidney stone    Neuropathy    Obesity    Sleep apnea    C PAP    Past Surgical History:  Procedure Laterality Date   APPENDECTOMY     CATARACT EXTRACTION W/PHACO Left 05/20/2017   Procedure: CATARACT EXTRACTION PHACO AND INTRAOCULAR LENS PLACEMENT ([place]);  Surgeon: Birder Robson, MD;  Location: [facility];  Service: Ophthalmology;  Laterality: Left;  [place] 00:32.0 AP% 15.5 CDE 4.97 Fluid Pack Lot # [id number]   CATARACT EXTRACTION W/PHACO Right 06/10/2017   Procedure: CATARACT EXTRACTION PHACO AND INTRAOCULAR LENS PLACEMENT ([facility]);  Surgeon: Birder Robson, MD;  Location: [facility];  Service: Ophthalmology;  Laterality:  Right;  [place]  00:51 AP% 15.4 CDE 7.95 Fluid pack lot # [id number]   CHOLECYSTECTOMY     CYSTOSCOPY W/ RETROGRADES Right 08/13/2019   Procedure: CYSTOSCOPY WITH RETROGRADE PYELOGRAM;  Surgeon: Billey Co, MD;  Location: [facility];  Service: Urology;  Laterality: Right;   CYSTOSCOPY WITH STENT PLACEMENT Right 07/26/2019   Procedure: CYSTOSCOPY WITH STENT PLACEMENT;  Surgeon: Lucas Mallow, MD;  Location: [facility];  Service: Urology;  Laterality: Right;   CYSTOSCOPY/URETEROSCOPY/HOLMIUM LASER/STENT PLACEMENT Right 08/13/2019   Procedure: CYSTOSCOPY/URETEROSCOPY/HOLMIUM LASER/STENT EXCHANGE;  Surgeon: Billey Co, MD;  Location: [facility];  Service: Urology;  Laterality: Right;   FRACTURE SURGERY     WRIST   HAND SURGERY     HERNIA REPAIR     LEFT HEART CATH AND CORONARY ANGIOGRAPHY N/A 12/31/2021   Procedure: LEFT HEART CATH AND CORONARY ANGIOGRAPHY;  Surgeon: Corey Skains, MD;  Location: [facility];  Service: Cardiovascular;  Laterality: N/A;   SHOULDER ARTHROSCOPY WITH SUBACROMIAL DECOMPRESSION AND OPEN ROTATOR C Right 09/11/2020   Procedure: Right shoulder arthroscopic rotator cuff repair vs Regeneten patch application, subacromial decompression, and biceps tenodesis - Reche Dixon to Assist;  Surgeon: Leim Fabry, MD;  Location: [facility];  Service: Orthopedics;  Laterality: Right;   TONSILLECTOMY      Home Medications:  Current Meds  Medication Sig   amLODipine (NORVASC) 5 MG tablet Take 5 mg by mouth daily.   aspirin EC 81 MG tablet Take by  mouth.   calcitRIOL (ROCALTROL) 0.25 MCG capsule Take 0.25 mcg by mouth daily.   clotrimazole (LOTRIMIN) 1 % cream Apply 1 Application topically daily as needed.   furosemide (LASIX) 40 MG tablet Take 40 mg by mouth 3 (three) times a week.   insulin aspart (NOVOLOG) 100 UNIT/ML injection Inject 30 units before breakfast and lunch. Inject 55 units before supper.   insulin detemir (LEVEMIR) 100 UNIT/ML injection Inject 50 mLs into  the skin daily.   lovastatin (MEVACOR) 40 MG tablet Take 40 mg by mouth daily with supper.   metoprolol succinate (TOPROL-XL) 50 MG 24 hr tablet Take 50 mg by mouth daily.   potassium chloride SA (K-DUR,KLOR-CON) 20 MEQ tablet Take 20 mEq by mouth daily.   valsartan (DIOVAN) 320 MG tablet     Allergies:  Allergies  Allergen Reactions   Codeine Nausea And Vomiting   Doxycycline    Erythromycin Rash    Family History  Problem Relation Age of Onset   Emphysema Mother    COPD Mother    Heart disease Mother    Brain cancer Father     Social History:  reports that he has never smoked. He has never been exposed to tobacco smoke. He has never used smokeless tobacco. He reports that he does not currently use drugs. He reports that he does not drink alcohol.  ROS: A complete review of systems was performed.  All systems are negative except for pertinent findings as noted.  Physical Exam:  Vital signs in last 24 hours: Temp:  [97.5 F (36.4 C)-98.7 F (37.1 C)] 97.8 F (36.6 C) (08/08 1528) Pulse Rate:  [71-82] 71 (08/08 1528) Resp:  [16-24] 18 (08/08 1528) BP: (146-170)/(62-88) 146/62 (08/08 1528) SpO2:  [92 %-100 %] 97 % (08/08 1528) Constitutional:  Alert and oriented, No acute distress HEENT: [place] AT, moist mucus membranes.  Trachea midline, no masses Cardiovascular: Regular rate and rhythm, no clubbing, cyanosis, or edema. Respiratory: Normal respiratory effort, lungs clear bilaterally GI: Abdomen is soft, nontender, nondistended, no abdominal masses Psychiatric: Normal mood and affect   Laboratory Data:  Recent Labs    02/25/22 1316 02/26/22 0948  WBC 13.3* 10.3  HGB 11.3* 11.7*  HCT 36.2* 37.0*   Recent Labs    02/25/22 1005 02/26/22 0948  NA 135 138  K 3.9 4.3  CL 105 108  CO2 23 20*  GLUCOSE 118* 296*  BUN 80* 67*  CREATININE 5.61* 5.57*  CALCIUM 8.4* 8.4*    Radiologic Imaging: CT images were personally reviewed and interpreted  ECHOCARDIOGRAM  COMPLETE  Result Date: 02/26/2022    ECHOCARDIOGRAM REPORT   Patient Name:   James Clarke Date of Exam: 02/26/2022 Medical Rec #:  [id number]       Height:       70.0 in Accession #:    [id number]      Weight:       328.5 lb Date of Birth:  09-17-1940      BSA:          2.577 m Patient Age:    43 years        BP:           156/72 mmHg Patient Gender: M               HR:           77 bpm. Exam Location:  [facility] Procedure: 2D Echo, Color Doppler, Cardiac Doppler and Intracardiac  Opacification Agent Indications:     CHF-acute systolic G01.74  History:         Patient has prior history of Echocardiogram examinations, most                  recent 11/26/2016. Signs/Symptoms:Dyspnea; Risk Factors:Diabetes,                  Hypertension and Sleep Apnea.  Sonographer:     Sherrie Sport Referring Phys:  [place] Diagnosing Phys: Yolonda Kida MD  Sonographer Comments: Technically challenging study due to limited acoustic windows, suboptimal apical window and no subcostal window. IMPRESSIONS  1. Left ventricular ejection fraction, by estimation, is 55 to 60%. The left ventricle has normal function. The left ventricle has no regional wall motion abnormalities. The left ventricular internal cavity size was mildly dilated. Left ventricular diastolic parameters are consistent with Grade II diastolic dysfunction (pseudonormalization).  2. Right ventricular systolic function is normal. The right ventricular size is normal.  3. Left atrial size was mildly dilated.  4. Right atrial size was mildly dilated.  5. The mitral valve is normal in structure. Trivial mitral valve regurgitation.  6. The aortic valve is grossly normal. Aortic valve regurgitation is not visualized. FINDINGS  Left Ventricle: Left ventricular ejection fraction, by estimation, is 55 to 60%. The left ventricle has normal function. The left ventricle has no regional wall motion abnormalities. Definity contrast agent was given IV to delineate the  left ventricular  endocardial borders. The left ventricular internal cavity size was mildly dilated. There is no left ventricular hypertrophy. Left ventricular diastolic parameters are consistent with Grade II diastolic dysfunction (pseudonormalization). Right Ventricle: The right ventricular size is normal. No increase in right ventricular wall thickness. Right ventricular systolic function is normal. Left Atrium: Left atrial size was mildly dilated. Right Atrium: Right atrial size was mildly dilated. Pericardium: There is no evidence of pericardial effusion. Mitral Valve: The mitral valve is normal in structure. Trivial mitral valve regurgitation. Tricuspid Valve: The tricuspid valve is normal in structure. Tricuspid valve regurgitation is trivial. Aortic Valve: The aortic valve is grossly normal. Aortic valve regurgitation is not visualized. Aortic valve mean gradient measures 6.0 mmHg. Aortic valve peak gradient measures 9.1 mmHg. Aortic valve area, by VTI measures 2.56 cm. Pulmonic Valve: The pulmonic valve was normal in structure. Pulmonic valve regurgitation is not visualized. Aorta: The ascending aorta was not well visualized. IAS/Shunts: No atrial level shunt detected by color flow Doppler.  LEFT VENTRICLE PLAX 2D LVIDd:         5.10 cm   Diastology LVIDs:         3.60 cm   LV e' medial:    2.94 cm/s LV PW:         1.50 cm   LV E/e' medial:  27.8 LV IVS:        1.50 cm   LV e' lateral:   6.64 cm/s LVOT diam:     2.00 cm   LV E/e' lateral: 12.3 LV SV:         65 LV SV Index:   25 LVOT Area:     3.14 cm  RIGHT VENTRICLE RV Basal diam:  4.50 cm RV S prime:     12.30 cm/s TAPSE (M-mode): 3.4 cm LEFT ATRIUM              Index        RIGHT ATRIUM  Index LA diam:        4.10 cm  1.59 cm/m   RA Area:     33.70 cm LA Vol (A2C):   114.0 ml 44.23 ml/m  RA Volume:   123.00 ml 47.72 ml/m LA Vol (A4C):   123.0 ml 47.72 ml/m LA Biplane Vol: 119.0 ml 46.17 ml/m  AORTIC VALVE AV Area (Vmax):    2.10 cm AV  Area (Vmean):   1.98 cm AV Area (VTI):     2.56 cm AV Vmax:           151.00 cm/s AV Vmean:          110.000 cm/s AV VTI:            0.253 m AV Peak Grad:      9.1 mmHg AV Mean Grad:      6.0 mmHg LVOT Vmax:         101.00 cm/s LVOT Vmean:        69.200 cm/s LVOT VTI:          0.206 m LVOT/AV VTI ratio: 0.81  AORTA Ao Root diam: 4.07 cm MITRAL VALVE               TRICUSPID VALVE MV Area (PHT): 4.57 cm    TR Peak grad:   7.6 mmHg MV Decel Time: 166 msec    TR Vmax:        138.00 cm/s MV E velocity: 81.80 cm/s MV A velocity: 53.60 cm/s  SHUNTS MV E/A ratio:  1.53        Systemic VTI:  0.21 m                            Systemic Diam: 2.00 cm Dwayne Prince [place] MD Electronically signed by Yolonda Kida MD Signature Date/Time: 02/26/2022/1:51:02 PM    Final    CT ABDOMEN PELVIS WO CONTRAST  Result Date: 02/26/2022 CLINICAL DATA:  Abdominal pain, acute, nonlocalized abd pain/ flank pain/acute kidney injury. EXAM: CT ABDOMEN AND PELVIS WITHOUT CONTRAST TECHNIQUE: Multidetector CT imaging of the abdomen and pelvis was performed following the standard protocol without IV contrast. RADIATION DOSE REDUCTION: This exam was performed according to the departmental dose-optimization program which includes automated exposure control, adjustment of the mA and/or kV according to patient size and/or use of iterative reconstruction technique. COMPARISON:  07/26/2019 FINDINGS: Lower chest: Mildly progressive subpleural bibasilar pulmonary fibrotic change. Extensive coronary artery calcification. Global cardiac size within normal limits. Stable trace pericardial effusion. Hepatobiliary: No focal liver abnormality is seen. Status post cholecystectomy. No biliary dilatation. Pancreas: Unremarkable Spleen: Unremarkable Adrenals/Urinary Tract: The adrenal glands are unremarkable. The kidneys are normal in size and position. There is mild left hydronephrosis and moderate perinephric stranding secondary to 2 obstructing calculi  measuring 2-3 mm in size within the distal left ureter just proximal to the left ureterovesicular junction. There is superimposed bilateral nonobstructing nephrolithiasis with calculi measuring up to 10 mm in greatest dimension bilaterally. No ureteral calculi on the right. No hydronephrosis on the right. Simple exophytic cortical cyst arises from the upper pole of the right kidney. No follow-up imaging is recommended for this lesion. The bladder is unremarkable. Stomach/Bowel: Moderate sigmoid diverticulosis without superimposed acute inflammatory change. Stomach, small bowel, and large bowel are otherwise unremarkable. Appendix absent. No free intraperitoneal gas or fluid. Vascular/Lymphatic: Mild aortoiliac atherosclerotic calcification. No aortic aneurysm. No pathologic adenopathy within the abdomen and pelvis. Reproductive: Mild prostatic  hypertrophy. Other: Small fat containing umbilical and bilateral inguinal hernias. Rectum unremarkable. Musculoskeletal: No acute bone abnormality. No lytic or blastic bone lesion. IMPRESSION: 1. Obstructing 2-3 mm calculi within the distal left ureter just proximal to the left ureterovesicular junction resulting in mild left hydronephrosis and moderate perinephric stranding. 2. Superimposed moderate bilateral nonobstructing nephrolithiasis. 3. Moderate sigmoid diverticulosis without superimposed acute inflammatory change. 4. Extensive coronary artery calcification. 5. Mildly progressive bibasilar pulmonary fibrosis. Aortic Atherosclerosis (ICD10-I70.0). Electronically Signed   By: Fidela Salisbury M.D.   On: 02/26/2022 02:34   US Renal  Result Date: 02/25/2022 CLINICAL DATA:  Worsening renal function. EXAM: RENAL / URINARY TRACT ULTRASOUND COMPLETE COMPARISON:  None Available. FINDINGS: Right Kidney: Renal measurements: 12.1 x 5.7 x 4.9 cm = volume: 175 mL. Mild renal cortical thinning and increased echogenicity. No hydronephrosis. Multiple cysts within the right kidney  measuring up to 4.2 cm. Left Kidney: Renal measurements: 12.3 x 6.4 x 5.5 cm = volume: 225 mL. Renal cortical thinning. No hydronephrosis. No renal mass. Bladder: Appears normal for degree of bladder distention. Other: None. IMPRESSION: No hydronephrosis. Mild renal cortical thinning bilaterally, raising the possibility of chronic medical renal disease. Electronically Signed   By: Lovey Newcomer M.D.   On: 02/25/2022 15:21   DG Abdomen 1 View  Result Date: 02/25/2022 CLINICAL DATA:  Abdominal pain and dysuria. Urinary tract infection symptoms. EXAM: ABDOMEN - 1 VIEW COMPARISON:  Abdominal radiograph 02/19/2021 FINDINGS: Redemonstrated opacities within the lung base, nonspecific. Nonobstructed bowel-gas pattern. Cholecystectomy clips. Right hemipelvis phlebolith. Lumbar spine degenerative changes. Stool throughout the colon. IMPRESSION: Nonobstructed bowel-gas pattern. No definite radiodensities to suggest nephrolithiasis. Electronically Signed   By: Lovey Newcomer M.D.   On: 02/25/2022 10:24    Impression/Assessment:  81 y.o. male with stage IV chronic kidney disease with creatinine ~ 5.5.  He has distal ureteral calculi x2 with mild left hydronephrosis.  Given his CKD the calculi are the most likely source of his AKI  Recommendation:  Strain urine Start tamsulosin 0.4 mg Will add on for left ureteroscopy with stone removal/stent placement 02/27/2022 if he does not pass the stone in the interim The procedure was discussed including potential risks of bleeding, infection and ureteral injury.  The need for a postoperative stent was also discussed All questions were answered and he desires to proceed with stone removal   02/26/2022, 6:09 PM  John Giovanni,  MD

## 2022-02-26 NOTE — Inpatient Diabetes Management (Signed)
Inpatient Diabetes Program Recommendations  AACE/ADA: New Consensus Statement on Inpatient Glycemic Control (2015)  Target Ranges:  Prepandial:   less than 140 mg/dL      Peak postprandial:   less than 180 mg/dL (1-2 hours)      Critically ill patients:  140 - 180 mg/dL   Lab Results  Component Value Date   GLUCAP 272 (H) 02/26/2022   HGBA1C 6.4 (H) 02/25/2022    Review of Glycemic Control  Latest Reference Range & Units 02/25/22 15:09 02/25/22 15:57 02/25/22 20:46 02/26/22 08:39  Glucose-Capillary 70 - 99 mg/dL 43 (LL) 96 215 (H) 272 (H)  (LL): Data is critically low (H): Data is abnormally high  Diabetes history: DM2 Outpatient Diabetes medications: Levemir 50 units qd, Novolog ac meals 30-30-55 units Current orders for Inpatient glycemic control: Novolog 0-15 units tid  Inpatient Diabetes Program Recommendations:   Please consider: -Levemir 25 units qd -Novolog 8 units tid meal coverage if eats 50% meals  Thank you, Bethena Roys E. Alazia Crocket, RN, MSN, CDE  Diabetes Coordinator Inpatient Glycemic Control Team Team Pager [phone] (8am-5pm) 02/26/2022 10:44 AM

## 2022-02-26 NOTE — Progress Notes (Signed)
*  PRELIMINARY RESULTS* Echocardiogram 2D Echocardiogram has been performed.  Sherrie Sport 02/26/2022, 12:13 PM

## 2022-02-26 NOTE — Assessment & Plan Note (Addendum)
Cr 5.61 on presentation.  Baseline around 2.5. AKI due to obstruction, from obstructing 2-3 mm calculi within the distal left ureter, and/or dehydration from home lasix and Valsartan use.  Nephrology and urology following  Status post 3 hemodialysis session through temporary dialysis catheter per nephrology.  Catheter to be removed after dialysis session today and monitor renal function over the weekend to decide long-term dialysis need for nephrology  Lab Results  Component Value Date   CREATININE 3.16 (H) 03/07/2022   CREATININE 3.61 (H) 03/06/2022   CREATININE 4.53 (H) 03/05/2022

## 2022-02-26 NOTE — Assessment & Plan Note (Addendum)
CT Abd/Pelv showed Obstructing 2-3 mm calculi within the distal left ureter, however, pt's abdominal pain was across lower abdomen, unlike pt's prior episode of kidney stone pains.   Abdominal pain improved some with BM.  Pain control  Foley in place  Urology following

## 2022-02-26 NOTE — Assessment & Plan Note (Addendum)
   CPAP per home setting

## 2022-02-27 ENCOUNTER — Inpatient Hospital Stay: Payer: PPO | Admitting: Certified Registered Nurse Anesthetist

## 2022-02-27 ENCOUNTER — Inpatient Hospital Stay: Payer: PPO

## 2022-02-27 ENCOUNTER — Encounter
Admission: EM | Disposition: A | Discharge status: Home / Self Care | Payer: Self-pay | Source: Home / Self Care | Attending: Osteopathic Medicine

## 2022-02-27 DIAGNOSIS — R600 Localized edema: Secondary | ICD-10-CM

## 2022-02-27 DIAGNOSIS — I10 Essential (primary) hypertension: Secondary | ICD-10-CM

## 2022-02-27 DIAGNOSIS — N184 Chronic kidney disease, stage 4 (severe): Secondary | ICD-10-CM | POA: Diagnosis not present

## 2022-02-27 DIAGNOSIS — E1129 Type 2 diabetes mellitus with other diabetic kidney complication: Secondary | ICD-10-CM

## 2022-02-27 DIAGNOSIS — E1122 Type 2 diabetes mellitus with diabetic chronic kidney disease: Secondary | ICD-10-CM | POA: Diagnosis not present

## 2022-02-27 DIAGNOSIS — I129 Hypertensive chronic kidney disease with stage 1 through stage 4 chronic kidney disease, or unspecified chronic kidney disease: Secondary | ICD-10-CM | POA: Diagnosis not present

## 2022-02-27 DIAGNOSIS — I5032 Chronic diastolic (congestive) heart failure: Secondary | ICD-10-CM

## 2022-02-27 DIAGNOSIS — G4733 Obstructive sleep apnea (adult) (pediatric): Secondary | ICD-10-CM

## 2022-02-27 DIAGNOSIS — N179 Acute kidney failure, unspecified: Secondary | ICD-10-CM | POA: Diagnosis not present

## 2022-02-27 DIAGNOSIS — N201 Calculus of ureter: Secondary | ICD-10-CM | POA: Diagnosis not present

## 2022-02-27 DIAGNOSIS — R1030 Lower abdominal pain, unspecified: Secondary | ICD-10-CM | POA: Diagnosis not present

## 2022-02-27 HISTORY — PX: URETEROSCOPY WITH HOLMIUM LASER LITHOTRIPSY: SHX6645

## 2022-02-27 HISTORY — PX: CYSTOSCOPY/URETEROSCOPY/HOLMIUM LASER/STENT PLACEMENT: SHX6546

## 2022-02-27 LAB — GLUCOSE, CAPILLARY
Glucose-Capillary: 225 mg/dL — ABNORMAL HIGH (ref 70–99)
Glucose-Capillary: 233 mg/dL — ABNORMAL HIGH (ref 70–99)
Glucose-Capillary: 176 mg/dL — ABNORMAL HIGH (ref 70–99)
Glucose-Capillary: 187 mg/dL — ABNORMAL HIGH (ref 70–99)
Glucose-Capillary: 268 mg/dL — ABNORMAL HIGH (ref 70–99)
Glucose-Capillary: 289 mg/dL — ABNORMAL HIGH (ref 70–99)

## 2022-02-27 LAB — CBC
HCT: 31.4 % — ABNORMAL LOW (ref 39.0–52.0)
Hemoglobin: 10 g/dL — ABNORMAL LOW (ref 13.0–17.0)
MCH: 28.5 pg (ref 26.0–34.0)
MCHC: 31.8 g/dL (ref 30.0–36.0)
MCV: 89.5 fL (ref 80.0–100.0)
Platelets: 278 10*3/uL (ref 150–400)
RBC: 3.51 MIL/uL — ABNORMAL LOW (ref 4.22–5.81)
RDW: 13.8 % (ref 11.5–15.5)
WBC: 9.5 10*3/uL (ref 4.0–10.5)
nRBC: 0 % (ref 0.0–0.2)

## 2022-02-27 LAB — BASIC METABOLIC PANEL
Anion gap: 8 (ref 5–15)
BUN: 83 mg/dL — ABNORMAL HIGH (ref 8–23)
CO2: 21 mmol/L — ABNORMAL LOW (ref 22–32)
Calcium: 8 mg/dL — ABNORMAL LOW (ref 8.9–10.3)
Chloride: 110 mmol/L (ref 98–111)
Creatinine, Ser: 5.87 mg/dL — ABNORMAL HIGH (ref 0.61–1.24)
GFR, Estimated: 9 mL/min — ABNORMAL LOW (ref >60)
Glucose, Bld: 230 mg/dL — ABNORMAL HIGH (ref 70–99)
Potassium: 4 mmol/L (ref 3.5–5.1)
Sodium: 139 mmol/L (ref 135–145)

## 2022-02-27 LAB — MAGNESIUM: Magnesium: 2.3 mg/dL (ref 1.7–2.4)

## 2022-02-27 SURGERY — CYSTOSCOPY/URETEROSCOPY/HOLMIUM LASER/STENT PLACEMENT
Anesthesia: General | Laterality: Left

## 2022-02-27 MED ORDER — LIDOCAINE HCL (PF) 2 % IJ SOLN
INTRAMUSCULAR | Status: AC
  Filled 2022-02-27: qty 5

## 2022-02-27 MED ORDER — LACTATED RINGERS IV SOLN: INTRAVENOUS | Status: DC | PRN

## 2022-02-27 MED ORDER — OXYCODONE HCL 5 MG PO TABS: 5.0000 mg | ORAL_TABLET | Freq: Once | ORAL | Status: DC | PRN

## 2022-02-27 MED ORDER — IOHEXOL 180 MG/ML  SOLN
INTRAMUSCULAR | Status: DC | PRN
  Administered 2022-02-27: 20 mL

## 2022-02-27 MED ORDER — DEXTROSE 5 % IV SOLN
INTRAVENOUS | Status: DC | PRN
  Administered 2022-02-27: 3 g via INTRAVENOUS

## 2022-02-27 MED ORDER — SODIUM CHLORIDE 0.9 % IR SOLN
Status: DC | PRN
  Administered 2022-02-27: 3000 mL

## 2022-02-27 MED ORDER — FENTANYL CITRATE (PF) 100 MCG/2ML IJ SOLN
INTRAMUSCULAR | Status: DC | PRN
  Administered 2022-02-27: 50 ug via INTRAVENOUS
  Administered 2022-02-27 (×2): 25 ug via INTRAVENOUS

## 2022-02-27 MED ORDER — LIDOCAINE HCL (CARDIAC) PF 100 MG/5ML IV SOSY
PREFILLED_SYRINGE | INTRAVENOUS | Status: DC | PRN
  Administered 2022-02-27: 100 mg via INTRAVENOUS

## 2022-02-27 MED ORDER — ONDANSETRON HCL 4 MG/2ML IJ SOLN: 4.0000 mg | Freq: Once | INTRAMUSCULAR | Status: DC | PRN

## 2022-02-27 MED ORDER — OXYCODONE HCL 5 MG/5ML PO SOLN: 5.0000 mg | Freq: Once | ORAL | Status: DC | PRN

## 2022-02-27 MED ORDER — SUGAMMADEX SODIUM 500 MG/5ML IV SOLN
INTRAVENOUS | Status: DC | PRN
  Administered 2022-02-27: 500 mg via INTRAVENOUS

## 2022-02-27 MED ORDER — PROPOFOL 10 MG/ML IV BOLUS
INTRAVENOUS | Status: DC | PRN
  Administered 2022-02-27: 200 mg via INTRAVENOUS

## 2022-02-27 MED ORDER — ONDANSETRON HCL 4 MG/2ML IJ SOLN
INTRAMUSCULAR | Status: DC | PRN
  Administered 2022-02-27: 4 mg via INTRAVENOUS

## 2022-02-27 MED ORDER — ROCURONIUM BROMIDE 100 MG/10ML IV SOLN
INTRAVENOUS | Status: DC | PRN
  Administered 2022-02-27: 50 mg via INTRAVENOUS

## 2022-02-27 MED ORDER — ROCURONIUM BROMIDE 10 MG/ML (PF) SYRINGE
PREFILLED_SYRINGE | INTRAVENOUS | Status: AC
  Filled 2022-02-27: qty 10

## 2022-02-27 MED ORDER — LACTATED RINGERS IV SOLN
INTRAVENOUS | Status: DC
Start: 2022-02-27 — End: 2022-02-27

## 2022-02-27 MED ORDER — DEXAMETHASONE SODIUM PHOSPHATE 10 MG/ML IJ SOLN
INTRAMUSCULAR | Status: DC | PRN
  Administered 2022-02-27: 5 mg via INTRAVENOUS

## 2022-02-27 MED ORDER — SEVOFLURANE IN SOLN
RESPIRATORY_TRACT | Status: AC
  Filled 2022-02-27: qty 250

## 2022-02-27 MED ORDER — FENTANYL CITRATE (PF) 100 MCG/2ML IJ SOLN: 25.0000 ug | INTRAMUSCULAR | Status: DC | PRN

## 2022-02-27 MED ORDER — PHENYLEPHRINE 80 MCG/ML (10ML) SYRINGE FOR IV PUSH (FOR BLOOD PRESSURE SUPPORT)
PREFILLED_SYRINGE | INTRAVENOUS | Status: DC | PRN
  Administered 2022-02-27 (×2): 80 ug via INTRAVENOUS
  Administered 2022-02-27 (×4): 160 ug via INTRAVENOUS

## 2022-02-27 MED ORDER — FENTANYL CITRATE (PF) 100 MCG/2ML IJ SOLN
INTRAMUSCULAR | Status: AC
  Filled 2022-02-27: qty 2

## 2022-02-27 MED ORDER — ACETAMINOPHEN 10 MG/ML IV SOLN: 1000.0000 mg | Freq: Once | INTRAVENOUS | Status: DC | PRN

## 2022-02-27 MED ORDER — PROPOFOL 10 MG/ML IV BOLUS
INTRAVENOUS | Status: AC
  Filled 2022-02-27: qty 20

## 2022-02-27 MED ORDER — EPHEDRINE SULFATE (PRESSORS) 50 MG/ML IJ SOLN
INTRAMUSCULAR | Status: DC | PRN
  Administered 2022-02-27 (×3): 5 mg via INTRAVENOUS
  Administered 2022-02-27: 10 mg via INTRAVENOUS

## 2022-02-27 MED ORDER — ONDANSETRON HCL 4 MG/2ML IJ SOLN
INTRAMUSCULAR | Status: AC
  Filled 2022-02-27: qty 2

## 2022-02-27 MED ORDER — DEXAMETHASONE SODIUM PHOSPHATE 10 MG/ML IJ SOLN
INTRAMUSCULAR | Status: AC
  Filled 2022-02-27: qty 1

## 2022-02-27 MED ORDER — ACETAMINOPHEN 10 MG/ML IV SOLN
INTRAVENOUS | Status: DC | PRN
  Administered 2022-02-27: 1000 mg via INTRAVENOUS

## 2022-02-27 SURGICAL SUPPLY — 24 items
BAG DRAIN SIEMENS DORNER NS (MISCELLANEOUS) ×2 IMPLANT
BAG URINE DRAIN 2000ML AR STRL (UROLOGICAL SUPPLIES) ×1 IMPLANT
BASKET ZERO TIP 1.9FR (BASKET) ×1 IMPLANT
BRUSH SCRUB EZ 1% IODOPHOR (MISCELLANEOUS) ×2 IMPLANT
CATH FOL 2WAY LX 16X5 (CATHETERS) ×1 IMPLANT
CNTNR SPEC 2.5X3XGRAD LEK (MISCELLANEOUS) ×1
CONRAY 43 FOR UROLOGY 50M (MISCELLANEOUS) ×2 IMPLANT
CONT SPEC 4OZ STER OR WHT (MISCELLANEOUS) ×2
CONTAINER SPEC 2.5X3XGRAD LEK (MISCELLANEOUS) IMPLANT
DRAPE UTILITY 15X26 TOWEL STRL (DRAPES) ×2 IMPLANT
FIBER LASER MOSES 200 DFL (Laser) ×1 IMPLANT
GLOVE SURG UNDER POLY LF SZ7.5 (GLOVE) ×2 IMPLANT
GOWN STRL REUS W/ TWL LRG LVL3 (GOWN DISPOSABLE) ×1 IMPLANT
GOWN STRL REUS W/ TWL XL LVL3 (GOWN DISPOSABLE) ×1 IMPLANT
GOWN STRL REUS W/TWL LRG LVL3 (GOWN DISPOSABLE) ×2
GOWN STRL REUS W/TWL XL LVL3 (GOWN DISPOSABLE) ×2
GUIDEWIRE STR DUAL SENSOR (WIRE) ×2 IMPLANT
GUIDEWIRE STR ZIPWIRE 035X150 (MISCELLANEOUS) ×1 IMPLANT
IV NS IRRIG 3000ML ARTHROMATIC (IV SOLUTION) ×2 IMPLANT
KIT TURNOVER CYSTO (KITS) ×2 IMPLANT
PACK CYSTO AR (MISCELLANEOUS) ×2 IMPLANT
SET CYSTO W/LG BORE CLAMP LF (SET/KITS/TRAYS/PACK) ×2 IMPLANT
SURGILUBE 2OZ TUBE FLIPTOP (MISCELLANEOUS) ×2 IMPLANT
WATER STERILE IRR 500ML POUR (IV SOLUTION) ×2 IMPLANT

## 2022-02-27 NOTE — Interval H&P Note (Signed)
History and Physical Interval Note:  Not aware of passing stone.  Creatinine up to 5.87.  All questions were answered.  02/27/2022 9:57 AM  James Clarke  has presented today for surgery, with the diagnosis of Ureteral with stone Laser Lithotripsy-Possible Ureteroscopy stent placement.  The various methods of treatment have been discussed with the patient and family. After consideration of risks, benefits and other options for treatment, the patient has consented to  Procedure(s): CYSTOSCOPY/URETEROSCOPY/HOLMIUM LASER/STENT PLACEMENT (Left) URETEROSCOPY WITH HOLMIUM LASER LITHOTRIPSY (Left) as a surgical intervention.  The patient's history has been reviewed, patient examined, no change in status, stable for surgery.  I have reviewed the patient's chart and labs.  Questions were answered to the patient's satisfaction.     [name]

## 2022-02-27 NOTE — TOC Initial Note (Signed)
Transition of Care [facility]) - Initial/Assessment Note    Patient Details  Name: James Clarke MRN: [id number] Date of Birth: 1941-02-26  [facility] CM/SW Contact:    Beverly Sessions, RN Phone Number: 02/27/2022, 4:22 PM  Clinical Narrative:                    Admitted HEN:[id number] LASER/STENT PLACEMENT (Left Admitted from:Home with wife who is at bedside PCP: [place]: Denies issues obtaining medications  Current home health/prior home health/DME:cane   Do not anticipate TOC needs at discharge. Please consult TOC if indicated       Patient Goals and CMS Choice        Expected Discharge Plan and Services                                                Prior Living Arrangements/Services                       Activities of Daily Living Home Assistive Devices/Equipment: Environmental consultant (specify type), Eyeglasses ADL Screening (condition at time of admission) Patient's cognitive ability adequate to safely complete daily activities?: Yes Is the patient deaf or have difficulty hearing?: No Does the patient have difficulty seeing, even when wearing glasses/contacts?: No Does the patient have difficulty concentrating, remembering, or making decisions?: No Patient able to express need for assistance with ADLs?: Yes Does the patient have difficulty dressing or bathing?: No Independently performs ADLs?: No Communication: Independent Dressing (OT): Needs assistance Is this a change from baseline?: Change from baseline, expected to last <3days Grooming: Needs assistance Is this a change from baseline?: Change from baseline, expected to last <3 days Feeding: Independent Bathing: Needs assistance Is this a change from baseline?: Change from baseline, expected to last <3 days Toileting: Needs assistance Is this a change from baseline?: Change from baseline, expected to last <3 days In/Out Bed: Needs assistance Is  this a change from baseline?: Change from baseline, expected to last <3 days Walks in Home: Independent with device (comment) Does the patient have difficulty walking or climbing stairs?: Yes Weakness of Legs: Both Weakness of Arms/Hands: None  Permission Sought/Granted                  Emotional Assessment              Admission diagnosis:  Bilateral flank pain [R10.9] Suprapubic abdominal pain [R10.2] Abdominal pain [R10.9] Acute renal failure superimposed on chronic kidney disease, unspecified CKD stage, unspecified acute renal failure type ([place]) [N17.9, N18.9] AKI (acute kidney injury) ([place]) [N17.9] Patient Active Problem List   Diagnosis Date Noted   Hydronephrosis of left kidney    Flank pain 02/25/2022   Abdominal pain 02/25/2022   Chronic diastolic CHF (congestive heart failure) ([place]) 02/25/2022   Acute renal failure superimposed on stage 4 chronic kidney disease ([place]) 02/25/2022   Right rotator cuff tear arthropathy 06/25/2021   Chronic right shoulder pain 06/25/2021   Localized osteoarthritis of right shoulder 06/25/2021   Chronic pain syndrome 32/67/1245   Acute systolic CHF (congestive heart failure) ([place]) 10/03/2020   Kidney stones 02/16/2020   Anemia in chronic kidney disease 07/26/2019   Chronic kidney disease, stage III (moderate) ([place]) 07/26/2019   Edema of lower extremity 07/26/2019   Malignant essential hypertension 07/26/2019   Secondary  hyperparathyroidism of renal origin ([place]) 07/26/2019   Left ureteral calculus 07/26/2019   Old complex tear of medial meniscus of right knee 10/07/2018   Primary osteoarthritis of right knee 10/07/2018   Fatty liver 04/16/2018   Aortic atherosclerosis ([place]) 04/16/2018   Bilateral leg edema 03/17/2018   Morbid obesity due to excess calories ([place]) 11/11/2014   Microalbuminuria 11/11/2014   Long-term insulin use ([place]) 11/11/2014   Perennial allergic rhinitis 09/12/2014   Type 2 diabetes mellitus with renal  manifestations ([place]) 03/19/2014   Sleep apnea 03/19/2014   Hypertension 03/19/2014   Hyperlipemia 03/19/2014   PCP:  Sofie Hartigan, MD Pharmacy:   [place], [place] [address] [address] Phone: [phone] Fax: [place], [place] [address] Phone: [phone] Fax: [place], [place] [facility] [address] [place] [facility] [place] [address] Phone: [phone] Fax: [phone]     Social Determinants of Health (SDOH) Interventions    Readmission Risk Interventions    02/27/2022    4:22 PM  Readmission Risk Prevention Plan  Transportation Screening Complete  HRI or Home Care Consult Complete  Palliative Care Screening Not Applicable  Medication Review (RN Care Manager) Complete

## 2022-02-27 NOTE — Anesthesia Preprocedure Evaluation (Addendum)
Anesthesia Evaluation  Patient identified by MRN, date of birth, ID band Patient awake    Reviewed: Allergy & Precautions, NPO status , Patient's Chart, lab work & pertinent test results  History of Anesthesia Complications Negative for: history of anesthetic complications  Airway Mallampati: III   Neck ROM: Full    Dental  (+) Missing, Caps   Pulmonary asthma , sleep apnea and Continuous Positive Airway Pressure Ventilation ,    Pulmonary exam normal breath sounds clear to auscultation       Cardiovascular hypertension, Normal cardiovascular exam Rhythm:Regular Rate:Normal  Myocardial perfusion 12/05/21:  .  Findings are consistent with ischemia and prior myocardial infarction. The study is high risk. .  No ST deviation was noted. .  LV perfusion is abnormal. Defect 1: There is a large defect with moderate reduction in uptake present in the apical inferior, inferolateral, lateral and apex location(s) that is partially reversible. Viability is present. There is abnormal wall motion in the defect area.  Inferior apical lateral overall normal left ventricular function Consistent with infarction and ischemia. .  Left ventricular function is normal. Nuclear stress EF: 57 %. The left ventricular ejection fraction is normal (55-65%). End diastolic cavity size is mildly enlarged. End systolic cavity size is normal.  Echo 02/27/37:  MILD LV SYSTOLIC DYSFUNCTION WITH MODERATE LVH  NORMAL RIGHT VENTRICULAR SYSTOLIC FUNCTION  MILD VALVULAR REGURGITATION   TRIVIAL PERICARDIAL EFFUSION  NO VALVULAR STENOSIS  MILD MR, TR  EF 45%   ECG 12/31/21:  Sinus rhythm Ventricular premature complex Borderline intraventricular conduction delay Borderline repol abnormality, lateral leads Borderline prolonged QT interval Baseline wander in lead(s) II III   Neuro/Psych  Neuromuscular disease (neuropathy)    GI/Hepatic negative GI ROS,   Endo/Other   diabetes, Type 2, Insulin DependentClass 3 obesity  Renal/GU Renal disease (nephrolithiasis; AKI on stage IV CKD)     Musculoskeletal  (+) Arthritis ,   Abdominal   Peds  Hematology  (+) Blood dyscrasia, anemia ,   Anesthesia Other Findings   Reproductive/Obstetrics                            Anesthesia Physical Anesthesia Plan  ASA: 3  Anesthesia Plan: General   Post-op Pain Management:    Induction: Intravenous  PONV Risk Score and Plan: 2 and Ondansetron, Dexamethasone and Treatment may vary due to age or medical condition  Airway Management Planned: Oral ETT  Additional Equipment:   Intra-op Plan:   Post-operative Plan: Extubation in OR  Informed Consent: I have reviewed the patients History and Physical, chart, labs and discussed the procedure including the risks, benefits and alternatives for the proposed anesthesia with the patient or authorized representative who has indicated his/her understanding and acceptance.     Dental advisory given  Plan Discussed with: CRNA  Anesthesia Plan Comments: (Patient consented for risks of anesthesia including but not limited to:  - adverse reactions to medications - damage to eyes, teeth, lips or other oral mucosa - nerve damage due to positioning  - sore throat or hoarseness - damage to heart, brain, nerves, lungs, other parts of body or loss of life  Informed patient about role of CRNA in peri- and intra-operative care.  Patient voiced understanding.)        Anesthesia Quick Evaluation

## 2022-02-27 NOTE — Hospital Course (Addendum)
James Clarke is a 81 y.o. male with past medical history of chronic kidney disease 4, hypertension, diabetes mellitus type 2, chronic CHF and bilateral leg edema (Echo 218 2018 may shows normal left ventricular cavity size systolic function normal ejection fraction between 50 to 55%), kidney stones, obstructive sleep apnea who presented 02/25/2022 with abdominal pain evaluated in the urgent care initially and was referred to the emergency room after being told that he has acute kidney injury. 08/07: BUN of 80 creatinine of 5.61 (baseline approx 2.5), GFR of 10, WBC 13.3, UA more than 300 protein and small hemoglobin with 0-5 RBCs and 0-5 WBCs. CT Abd/Pelv: stone at the left UVJ with accompanying hydronephrosis.  Renal US: unremarkable. Admitted for ARF on CKD3 suspected prerenal d/t obstructing 2-3 mm calculi within the distal left ureter, and dehydration from decreased po intake plus home lasix and Valsartan use. DVT study given edema but more likely amlodipine effect and hypoalbuminemia.  08/08: seen by urology and nephrology. Echo done: LVEF 55-60 w/ G2 DD.  08/09: Patient underwent left ureteroscopy  08/10: Cr 5.91, UA abnormal, started Rocephin for UTI pending cultures, will repeat urine lytes to confirm FeNa --> concern for intrinsic causes.  Nephrology recs: No need for dialysis at the moment. 08/11: Minimal improvement in Cr 5.74.  No growth on urine culture, continuing current IV antibiotics. Will recollect urine.  Patient also complaining of some discharge/irritation around urethra/glans, treating for yeast infection 08/12: Cr 5.5.  08/13: Cr 5.14.  08/14: Cr 4.8. Still pending UCx result from 08/11 recollection. Nephrology team is planning for temporary dialysis tomorrow. Pt reports continued loose stool w/ episodes of stool incontinence, no fever, GI PCR negative. Foley d/c tomorrow for voiding trial per urology. Consult vascular surgery for placement of temp cath tomorrow for dialysis which  should hopefully also help fluid overload since lasix unable to be provided d/t renal dysfunction.  08/15: temporary dialysis catheter placed. Plan initiate HD for 3 consecutive days, underwent first session today, no fluid removed. Plan remove Foley in AM for voiding trial.  08/16: HD this AM.  Foley out 8/17: Temp cath removed. Patient and wife requesting to stop antibiotics as they are afraid it is upsetting his stomach 8/18: Urology follow-up today.  Await renal recovery over the weekend to determine long-term dialysis need for nephrology.    Appreciate specialist recommendations: Urology - UCX showed no growth and repeat urine culture results are pending (specimen ordered and not sent) but question inflammatory response - request recommendations re: need for continued antibiotics

## 2022-02-27 NOTE — Progress Notes (Signed)
PROGRESS NOTE    James Clarke   QQI:[id number] DOB: Jun 16, 1941  DOA: 02/25/2022 Date of Service: 02/27/22 PCP: Sofie Hartigan, MD     Brief Narrative / Hospital Course:  James Clarke is a 81 y.o. male with past medical history of chronic kidney disease 4, hypertension, diabetes mellitus type 2, chronic CHF and bilateral leg edema (Echo 218 2018 may shows normal left ventricular cavity size systolic function normal ejection fraction between 50 to 55%), kidney stones, obstructive sleep apnea who presented 02/25/2022 with abdominal pain evaluated in the urgent care initially and was referred to the emergency room after being told that he has acute kidney injury. 08/07: BUN of 80 creatinine of 5.61 (baseline approx 2.5), GFR of 10, WBC 13.3, UA more than 300 protein and small hemoglobin with 0-5 RBCs and 0-5 WBCs. CT Abd/Pelv: stone at the left UVJ with accompanying hydronephrosis.  Renal US: unremarkable. Admitted for ARF on CKD3 suspected prerenal d/t obstructing 2-3 mm calculi within the distal left ureter, and dehydration from decreased po intake plus home lasix and Valsartan use. DVT study given edema but more likely amlodipine effect and hypoalbuminemia.  08/08: seen by urology and nephrology. Echo done: LVEF 55-60 w/ G2 DD.  08/09: Patient underwent left ureteroscopy    Consultants:  Urology Nephrology   Procedures: Echo L ureteroscopy 02/27/22    Subjective: Patient reports burning sensation in urethra (Foley in place and he is status post ureteroscopy this morning), feeling tired and "crappy" - wife states typical for him post-op. He reports mildly hungry.      ASSESSMENT & PLAN:   Principal Problem:   Acute renal failure superimposed on stage 4 chronic kidney disease (HCC) Active Problems:   Abdominal pain   Type 2 diabetes mellitus with renal manifestations (HCC)   Sleep apnea   Hypertension   Bilateral leg edema   Left ureteral calculus   Chronic  diastolic CHF (congestive heart failure) (HCC)   Hydronephrosis of left kidney   Abdominal pain CT Abd/Pelv showed Obstructing 2-3 mm calculi within the distal left ureter, however, pt's abdominal pain was across lower abdomen, unlike pt's prior episode of kidney stone pains.   Abdominal pain improved some with BM. Pain control Foley in place Urology following   Acute renal failure superimposed on stage 4 chronic kidney disease (HCC) Cr 5.61 on presentation.  Baseline around 2.5. AKI due to obstruction, from obstructing 2-3 mm calculi within the distal left ureter, and/or dehydration from home lasix and Valsartan use. Nephrology and urology following  Serial BMP  Caution w/ IV fluids to avoid CHF exacerbation   Type 2 diabetes mellitus with renal manifestations (HCC) A1c 6.4, well controlled.   Hyperglycemia due to not resumed on home insulin Plan: resume home Levemir at 25u daily add mealtime 8u TID  Sleep apnea CPAP per home setting.  Hypertension hold home lasix and Valsartan due to AKI cont home Toprol add hydralazine 50 mg Q8h due to elevated BP  Bilateral leg edema Not currently edematous. hold home lasix 2/2 AKI  Chronic diastolic CHF (congestive heart failure) (HCC) not in acute exacerbation hold home lasix 2/2 AKI Echo done this admission  Hydronephrosis of left kidney Obstructing 2-3 mm calculi within the distal left ureter just proximal to the left ureterovesicular junction resulting in mild left hydronephrosis and moderate perinephric stranding. urology planning L ureteroscopy today   Left ureteral calculus urology recs: L ureteroscopy planned today     DVT prophylaxis: heparin Code Status:  FULL Family Communication: wife at bedside  Disposition Plan / TOC needs: none at this time  Barriers to discharge / significant pending items: pending urology recs / discharge appropriateness postop, still AKI/ARF              Objective: Vitals:    02/27/22 1100 02/27/22 1115 02/27/22 1124 02/27/22 1136  BP: 128/65 128/63 134/61 (!) 146/62  Pulse: 86 87 84 81  Resp: '18 18 18 18  '$ Temp: (!) 97.1 F (36.2 C)  (!) 97.4 F (36.3 C) 97.6 F (36.4 C)  TempSrc:    Oral  SpO2: 99% 94% 93% 93%  Weight:      Height:        Intake/Output Summary (Last 24 hours) at 02/27/2022 1500 Last data filed at 02/27/2022 1432 Gross per 24 hour  Intake 860 ml  Output 551 ml  Net 309 ml   Filed Weights   02/25/22 1256 02/27/22 0400 02/27/22 0940  Weight: (!) 149 kg (!) 146.2 kg (!) 150.6 kg    Examination:  Constitutional:  VS as above General Appearance: alert, well-nourished, NAD Ears, Nose, Mouth, Throat: Normal appearance Neck: No masses, trachea midline Respiratory: Normal respiratory effort Breath sounds normal, no wheeze/rhonchi/rales Cardiovascular: S1/S2 normal, no murmur/rub/gallop auscultated No lower extremity edema Gastrointestinal: Nontender, no masses Musculoskeletal:  No clubbing/cyanosis of digits Neurological: No cranial nerve deficit on limited exam Psychiatric: Normal judgment/insight Normal mood and affect       Scheduled Medications:   amLODipine  10 mg Oral Daily   aspirin EC  81 mg Oral Daily   heparin  5,000 Units Subcutaneous Q8H   hydrALAZINE  50 mg Oral Q8H   insulin aspart  0-15 Units Subcutaneous TID WC   insulin aspart  8 Units Subcutaneous TID WC   insulin detemir  25 Units Subcutaneous Daily   metoprolol succinate  50 mg Oral Daily   polyethylene glycol  34 g Oral BID   sodium chloride flush  3 mL Intravenous Q12H   tamsulosin  0.4 mg Oral Daily    Continuous Infusions:   PRN Medications:  acetaminophen **OR** acetaminophen, albuterol, hydrALAZINE, HYDROcodone-acetaminophen  Antimicrobials:  Anti-infectives (From admission, onward)    None       Data Reviewed: I have personally reviewed following labs and imaging studies  CBC: Recent Labs  Lab 02/25/22 1316  02/26/22 0948 02/27/22 0410  WBC 13.3* 10.3 9.5  NEUTROABS 9.9*  --   --   HGB 11.3* 11.7* 10.0*  HCT 36.2* 37.0* 31.4*  MCV 91.4 90.2 89.5  PLT 290 280 892   Basic Metabolic Panel: Recent Labs  Lab 02/25/22 1005 02/25/22 1316 02/26/22 0948 02/27/22 0410  NA 135  --  138 139  K 3.9  --  4.3 4.0  CL 105  --  108 110  CO2 23  --  20* 21*  GLUCOSE 118*  --  296* 230*  BUN 80*  --  67* 83*  CREATININE 5.61*  --  5.57* 5.87*  CALCIUM 8.4*  --  8.4* 8.0*  MG  --  2.2  --  2.3  PHOS  --  5.4*  --   --    GFR: Estimated Creatinine Clearance: 14.5 mL/min (A) (by C-G formula based on SCr of 5.87 mg/dL (H)). Liver Function Tests: Recent Labs  Lab 02/25/22 1005 02/26/22 0948  AST 19 21  ALT 14 15  ALKPHOS 71 69  BILITOT 0.5 0.7  PROT 7.2 7.0  ALBUMIN 2.8* 2.7*  No results for input(s): "LIPASE", "AMYLASE" in the last 168 hours. No results for input(s): "AMMONIA" in the last 168 hours. Coagulation Profile: No results for input(s): "INR", "PROTIME" in the last 168 hours. Cardiac Enzymes: No results for input(s): "CKTOTAL", "CKMB", "CKMBINDEX", "TROPONINI" in the last 168 hours. BNP (last 3 results) No results for input(s): "PROBNP" in the last 8760 hours. HbA1C: Recent Labs    02/25/22 1316  HGBA1C 6.4*   CBG: Recent Labs  Lab 02/26/22 2138 02/27/22 0721 02/27/22 0914 02/27/22 1105 02/27/22 1143  GLUCAP 245* 225* 233* 176* 187*   Lipid Profile: No results for input(s): "CHOL", "HDL", "LDLCALC", "TRIG", "CHOLHDL", "LDLDIRECT" in the last 72 hours. Thyroid Function Tests: Recent Labs    02/25/22 1316  TSH 4.013  FREET4 0.66   Anemia Panel: No results for input(s): "VITAMINB12", "FOLATE", "FERRITIN", "TIBC", "IRON", "RETICCTPCT" in the last 72 hours. Urine analysis:    Component Value Date/Time   COLORURINE YELLOW 02/25/2022 0947   APPEARANCEUR CLEAR 02/25/2022 0947   APPEARANCEUR Hazy (A) 08/19/2019 1113   LABSPEC 1.020 02/25/2022 0947   LABSPEC  1.008 11/27/2013 0810   PHURINE 5.5 02/25/2022 0947   GLUCOSEU 250 (A) 02/25/2022 0947   GLUCOSEU Negative 11/27/2013 0810   HGBUR SMALL (A) 02/25/2022 0947   BILIRUBINUR NEGATIVE 02/25/2022 0947   BILIRUBINUR Negative 08/19/2019 1113   BILIRUBINUR Negative 11/27/2013 0810   KETONESUR NEGATIVE 02/25/2022 0947   PROTEINUR >300 (A) 02/25/2022 0947   NITRITE NEGATIVE 02/25/2022 0947   LEUKOCYTESUR NEGATIVE 02/25/2022 0947   LEUKOCYTESUR Negative 11/27/2013 0810   Sepsis Labs: '@LABRCNTIP'$ (procalcitonin:4,lacticidven:4)  No results found for this or any previous visit (from the past 240 hour(s)).       Radiology Studies last 96 hours: DG OR UROLOGY CYSTO IMAGE ([place])  Result Date: 02/27/2022 There is no interpretation for this exam.  This order is for images obtained during a surgical procedure.  Please See "Surgeries" Tab for more information regarding the procedure.   ECHOCARDIOGRAM COMPLETE  Result Date: 02/26/2022    ECHOCARDIOGRAM REPORT   Patient Name:   AMAN GENE Dohse Date of Exam: 02/26/2022 Medical Rec #:  [id number]       Height:       70.0 in Accession #:    [id number]      Weight:       328.5 lb Date of Birth:  [birth year]      BSA:          2.577 m Patient Age:    89 years        BP:           156/72 mmHg Patient Gender: M               HR:           77 bpm. Exam Location:  [facility] Procedure: 2D Echo, Color Doppler, Cardiac Doppler and Intracardiac            Opacification Agent Indications:     CHF-acute systolic O84.16  History:         Patient has prior history of Echocardiogram examinations, most                  recent 11/26/2016. Signs/Symptoms:Dyspnea; Risk Factors:Diabetes,                  Hypertension and Sleep Apnea.  Sonographer:     Sherrie Sport Referring Phys:  [place] Diagnosing Phys: Yolonda Kida MD  Sonographer Comments: Technically challenging study due to limited acoustic windows, suboptimal apical window and no subcostal window. IMPRESSIONS  1. Left  ventricular ejection fraction, by estimation, is 55 to 60%. The left ventricle has normal function. The left ventricle has no regional wall motion abnormalities. The left ventricular internal cavity size was mildly dilated. Left ventricular diastolic parameters are consistent with Grade II diastolic dysfunction (pseudonormalization).  2. Right ventricular systolic function is normal. The right ventricular size is normal.  3. Left atrial size was mildly dilated.  4. Right atrial size was mildly dilated.  5. The mitral valve is normal in structure. Trivial mitral valve regurgitation.  6. The aortic valve is grossly normal. Aortic valve regurgitation is not visualized. FINDINGS  Left Ventricle: Left ventricular ejection fraction, by estimation, is 55 to 60%. The left ventricle has normal function. The left ventricle has no regional wall motion abnormalities. Definity contrast agent was given IV to delineate the left ventricular  endocardial borders. The left ventricular internal cavity size was mildly dilated. There is no left ventricular hypertrophy. Left ventricular diastolic parameters are consistent with Grade II diastolic dysfunction (pseudonormalization). Right Ventricle: The right ventricular size is normal. No increase in right ventricular wall thickness. Right ventricular systolic function is normal. Left Atrium: Left atrial size was mildly dilated. Right Atrium: Right atrial size was mildly dilated. Pericardium: There is no evidence of pericardial effusion. Mitral Valve: The mitral valve is normal in structure. Trivial mitral valve regurgitation. Tricuspid Valve: The tricuspid valve is normal in structure. Tricuspid valve regurgitation is trivial. Aortic Valve: The aortic valve is grossly normal. Aortic valve regurgitation is not visualized. Aortic valve mean gradient measures 6.0 mmHg. Aortic valve peak gradient measures 9.1 mmHg. Aortic valve area, by VTI measures 2.56 cm. Pulmonic Valve: The pulmonic  valve was normal in structure. Pulmonic valve regurgitation is not visualized. Aorta: The ascending aorta was not well visualized. IAS/Shunts: No atrial level shunt detected by color flow Doppler.  LEFT VENTRICLE PLAX 2D LVIDd:         5.10 cm   Diastology LVIDs:         3.60 cm   LV e' medial:    2.94 cm/s LV PW:         1.50 cm   LV E/e' medial:  27.8 LV IVS:        1.50 cm   LV e' lateral:   6.64 cm/s LVOT diam:     2.00 cm   LV E/e' lateral: 12.3 LV SV:         65 LV SV Index:   25 LVOT Area:     3.14 cm  RIGHT VENTRICLE RV Basal diam:  4.50 cm RV S prime:     12.30 cm/s TAPSE (M-mode): 3.4 cm LEFT ATRIUM              Index        RIGHT ATRIUM           Index LA diam:        4.10 cm  1.59 cm/m   RA Area:     33.70 cm LA Vol (A2C):   114.0 ml 44.23 ml/m  RA Volume:   123.00 ml 47.72 ml/m LA Vol (A4C):   123.0 ml 47.72 ml/m LA Biplane Vol: 119.0 ml 46.17 ml/m  AORTIC VALVE AV Area (Vmax):    2.10 cm AV Area (Vmean):   1.98 cm AV Area (VTI):     2.56 cm AV Vmax:  151.00 cm/s AV Vmean:          110.000 cm/s AV VTI:            0.253 m AV Peak Grad:      9.1 mmHg AV Mean Grad:      6.0 mmHg LVOT Vmax:         101.00 cm/s LVOT Vmean:        69.200 cm/s LVOT VTI:          0.206 m LVOT/AV VTI ratio: 0.81  AORTA Ao Root diam: 4.07 cm MITRAL VALVE               TRICUSPID VALVE MV Area (PHT): 4.57 cm    TR Peak grad:   7.6 mmHg MV Decel Time: 166 msec    TR Vmax:        138.00 cm/s MV E velocity: 81.80 cm/s MV A velocity: 53.60 cm/s  SHUNTS MV E/A ratio:  1.53        Systemic VTI:  0.21 m                            Systemic Diam: 2.00 cm Dwayne Prince [place] MD Electronically signed by Yolonda Kida MD Signature Date/Time: 02/26/2022/1:51:02 PM    Final    CT ABDOMEN PELVIS WO CONTRAST  Result Date: 02/26/2022 CLINICAL DATA:  Abdominal pain, acute, nonlocalized abd pain/ flank pain/acute kidney injury. EXAM: CT ABDOMEN AND PELVIS WITHOUT CONTRAST TECHNIQUE: Multidetector CT imaging of the abdomen and  pelvis was performed following the standard protocol without IV contrast. RADIATION DOSE REDUCTION: This exam was performed according to the departmental dose-optimization program which includes automated exposure control, adjustment of the mA and/or kV according to patient size and/or use of iterative reconstruction technique. COMPARISON:  07/26/2019 FINDINGS: Lower chest: Mildly progressive subpleural bibasilar pulmonary fibrotic change. Extensive coronary artery calcification. Global cardiac size within normal limits. Stable trace pericardial effusion. Hepatobiliary: No focal liver abnormality is seen. Status post cholecystectomy. No biliary dilatation. Pancreas: Unremarkable Spleen: Unremarkable Adrenals/Urinary Tract: The adrenal glands are unremarkable. The kidneys are normal in size and position. There is mild left hydronephrosis and moderate perinephric stranding secondary to 2 obstructing calculi measuring 2-3 mm in size within the distal left ureter just proximal to the left ureterovesicular junction. There is superimposed bilateral nonobstructing nephrolithiasis with calculi measuring up to 10 mm in greatest dimension bilaterally. No ureteral calculi on the right. No hydronephrosis on the right. Simple exophytic cortical cyst arises from the upper pole of the right kidney. No follow-up imaging is recommended for this lesion. The bladder is unremarkable. Stomach/Bowel: Moderate sigmoid diverticulosis without superimposed acute inflammatory change. Stomach, small bowel, and large bowel are otherwise unremarkable. Appendix absent. No free intraperitoneal gas or fluid. Vascular/Lymphatic: Mild aortoiliac atherosclerotic calcification. No aortic aneurysm. No pathologic adenopathy within the abdomen and pelvis. Reproductive: Mild prostatic hypertrophy. Other: Small fat containing umbilical and bilateral inguinal hernias. Rectum unremarkable. Musculoskeletal: No acute bone abnormality. No lytic or blastic bone  lesion. IMPRESSION: 1. Obstructing 2-3 mm calculi within the distal left ureter just proximal to the left ureterovesicular junction resulting in mild left hydronephrosis and moderate perinephric stranding. 2. Superimposed moderate bilateral nonobstructing nephrolithiasis. 3. Moderate sigmoid diverticulosis without superimposed acute inflammatory change. 4. Extensive coronary artery calcification. 5. Mildly progressive bibasilar pulmonary fibrosis. Aortic Atherosclerosis (ICD10-I70.0). Electronically Signed   By: Fidela Salisbury M.D.   On: 02/26/2022 02:34   US Renal  Result Date: 02/25/2022 CLINICAL DATA:  Worsening renal function. EXAM: RENAL / URINARY TRACT ULTRASOUND COMPLETE COMPARISON:  None Available. FINDINGS: Right Kidney: Renal measurements: 12.1 x 5.7 x 4.9 cm = volume: 175 mL. Mild renal cortical thinning and increased echogenicity. No hydronephrosis. Multiple cysts within the right kidney measuring up to 4.2 cm. Left Kidney: Renal measurements: 12.3 x 6.4 x 5.5 cm = volume: 225 mL. Renal cortical thinning. No hydronephrosis. No renal mass. Bladder: Appears normal for degree of bladder distention. Other: None. IMPRESSION: No hydronephrosis. Mild renal cortical thinning bilaterally, raising the possibility of chronic medical renal disease. Electronically Signed   By: Lovey Newcomer M.D.   On: 02/25/2022 15:21   DG Abdomen 1 View  Result Date: 02/25/2022 CLINICAL DATA:  Abdominal pain and dysuria. Urinary tract infection symptoms. EXAM: ABDOMEN - 1 VIEW COMPARISON:  Abdominal radiograph 02/19/2021 FINDINGS: Redemonstrated opacities within the lung base, nonspecific. Nonobstructed bowel-gas pattern. Cholecystectomy clips. Right hemipelvis phlebolith. Lumbar spine degenerative changes. Stool throughout the colon. IMPRESSION: Nonobstructed bowel-gas pattern. No definite radiodensities to suggest nephrolithiasis. Electronically Signed   By: Lovey Newcomer M.D.   On: 02/25/2022 10:24            LOS: 1  day     Emeterio Reeve, DO [facility] 02/27/2022, 3:00 PM   Staff may message me via secure chat in [place]  but this may not receive immediate response,  please page for urgent matters!  If 7PM-7AM, please contact night-coverage [email or web address]  Dictation software was used to generate the above note. Typos may occur and escape review, as with typed/written notes. Please contact Dr Sheppard Coil directly for clarity if needed.

## 2022-02-27 NOTE — Anesthesia Postprocedure Evaluation (Signed)
Anesthesia Post Note  Patient: James Clarke  Procedure(s) Performed: CYSTOSCOPY/URETEROSCOPY/HOLMIUM LASER/STENT PLACEMENT (Left) URETEROSCOPY WITH HOLMIUM LASER LITHOTRIPSY (Left)  Patient location during evaluation: PACU Anesthesia Type: General Level of consciousness: awake and alert, oriented and patient cooperative Pain management: pain level controlled Vital Signs Assessment: post-procedure vital signs reviewed and stable Respiratory status: spontaneous breathing, nonlabored ventilation and respiratory function stable Cardiovascular status: blood pressure returned to baseline and stable Postop Assessment: adequate PO intake Anesthetic complications: no   No notable events documented.   Last Vitals:  Vitals:   02/27/22 1124 02/27/22 1136  BP: 134/61 (!) 146/62  Pulse: 84 81  Resp: 18 18  Temp: (!) 36.3 C 36.4 C  SpO2: 93% 93%    Last Pain:  Vitals:   02/27/22 1136  TempSrc: Oral  PainSc:                  Darrin Nipper

## 2022-02-27 NOTE — TOC Initial Note (Signed)
Transition of Care [facility]) - Initial/Assessment Note    Patient Details  Name: James Clarke MRN: [id number] Date of Birth: 10-05-1940  [facility] CM/SW Contact:    Beverly Sessions, RN Phone Number: 02/27/2022, 10:02 AM  Clinical Narrative:                  Patient with high risk for readmission score  Patient currently off floor for CYSTOSCOPY/URETEROSCOPY/HOLMIUM LASER/STENT PLACEMENT (Left Will follow up with patient at a later time       Patient Goals and CMS Choice        Expected Discharge Plan and Services                                                Prior Living Arrangements/Services                       Activities of Daily Living Home Assistive Devices/Equipment: Environmental consultant (specify type), Eyeglasses ADL Screening (condition at time of admission) Patient's cognitive ability adequate to safely complete daily activities?: Yes Is the patient deaf or have difficulty hearing?: No Does the patient have difficulty seeing, even when wearing glasses/contacts?: No Does the patient have difficulty concentrating, remembering, or making decisions?: No Patient able to express need for assistance with ADLs?: Yes Does the patient have difficulty dressing or bathing?: No Independently performs ADLs?: No Communication: Independent Dressing (OT): Needs assistance Is this a change from baseline?: Change from baseline, expected to last <3days Grooming: Needs assistance Is this a change from baseline?: Change from baseline, expected to last <3 days Feeding: Independent Bathing: Needs assistance Is this a change from baseline?: Change from baseline, expected to last <3 days Toileting: Needs assistance Is this a change from baseline?: Change from baseline, expected to last <3 days In/Out Bed: Needs assistance Is this a change from baseline?: Change from baseline, expected to last <3 days Walks in Home: Independent with device (comment) Does  the patient have difficulty walking or climbing stairs?: Yes Weakness of Legs: Both Weakness of Arms/Hands: None  Permission Sought/Granted                  Emotional Assessment              Admission diagnosis:  Bilateral flank pain [R10.9] Suprapubic abdominal pain [R10.2] Abdominal pain [R10.9] Acute renal failure superimposed on chronic kidney disease, unspecified CKD stage, unspecified acute renal failure type ([place]) [N17.9, N18.9] AKI (acute kidney injury) ([place]) [N17.9] Patient Active Problem List   Diagnosis Date Noted   Hydronephrosis of left kidney    Flank pain 02/25/2022   Abdominal pain 02/25/2022   Chronic diastolic CHF (congestive heart failure) ([place]) 02/25/2022   Acute renal failure superimposed on stage 4 chronic kidney disease ([place]) 02/25/2022   Right rotator cuff tear arthropathy 06/25/2021   Chronic right shoulder pain 06/25/2021   Localized osteoarthritis of right shoulder 06/25/2021   Chronic pain syndrome 40/98/1191   Acute systolic CHF (congestive heart failure) ([place]) 10/03/2020   Kidney stones 02/16/2020   Anemia in chronic kidney disease 07/26/2019   Chronic kidney disease, stage III (moderate) ([place]) 07/26/2019   Edema of lower extremity 07/26/2019   Malignant essential hypertension 07/26/2019   Secondary hyperparathyroidism of renal origin ([place]) 07/26/2019   Left ureteral calculus 07/26/2019   Old complex tear  of medial meniscus of right knee 10/07/2018   Primary osteoarthritis of right knee 10/07/2018   Fatty liver 04/16/2018   Aortic atherosclerosis ([place]) 04/16/2018   Bilateral leg edema 03/17/2018   Morbid obesity due to excess calories ([place]) 11/11/2014   Microalbuminuria 11/11/2014   Long-term insulin use ([place]) 11/11/2014   Perennial allergic rhinitis 09/12/2014   Type 2 diabetes mellitus with renal manifestations ([place]) 03/19/2014   Sleep apnea 03/19/2014   Hypertension 03/19/2014   Hyperlipemia 03/19/2014   PCP:  Sofie Hartigan, MD Pharmacy:   [facility], [place] [address] [address] Phone: [phone] Fax: [place], [place] [address] Phone: [phone] Fax: [place], [place] [address] [place] [facility] [place] [address] Phone: [phone] Fax: [phone]     Social Determinants of Health (SDOH) Interventions    Readmission Risk Interventions     No data to display

## 2022-02-27 NOTE — Transfer of Care (Signed)
Immediate Anesthesia Transfer of Care Note  Patient: James Clarke  Procedure(s) Performed: CYSTOSCOPY/URETEROSCOPY/HOLMIUM LASER/STENT PLACEMENT (Left) URETEROSCOPY WITH HOLMIUM LASER LITHOTRIPSY (Left)  Patient Location: PACU  Anesthesia Type:General  Level of Consciousness: drowsy  Airway & Oxygen Therapy: Patient Spontanous Breathing and Patient connected to face mask oxygen  Post-op Assessment: Report given to RN and Post -op Vital signs reviewed and stable  Post vital signs: Reviewed and stable  Last Vitals:  Vitals Value Taken Time  BP 128/65   Temp    Pulse 86   Resp 15   SpO2 99     Last Pain:  Vitals:   02/27/22 0940  TempSrc: Oral  PainSc: 8          Complications: No notable events documented.

## 2022-02-27 NOTE — Anesthesia Procedure Notes (Signed)
Procedure Name: Intubation Date/Time: 02/27/2022 10:06 AM  Performed by: Lily Peer, Aras Albarran, CRNAPre-anesthesia Checklist: Patient identified, Emergency Drugs available, Suction available and Patient being monitored Patient Re-evaluated:Patient Re-evaluated prior to induction Oxygen Delivery Method: Circle system utilized Preoxygenation: Pre-oxygenation with 100% oxygen Induction Type: IV induction Ventilation: Mask ventilation without difficulty Laryngoscope Size: McGraph and 4 Grade View: Grade I Tube type: Oral Tube size: 7.5 mm Number of attempts: 1 Airway Equipment and Method: Stylet Placement Confirmation: ETT inserted through vocal cords under direct vision, positive ETCO2 and breath sounds checked- equal and bilateral Secured at: 20 cm Tube secured with: Tape Dental Injury: Teeth and Oropharynx as per pre-operative assessment

## 2022-02-28 ENCOUNTER — Encounter: Payer: Self-pay | Admitting: Urology

## 2022-02-28 DIAGNOSIS — R600 Localized edema: Secondary | ICD-10-CM | POA: Diagnosis not present

## 2022-02-28 DIAGNOSIS — N201 Calculus of ureter: Secondary | ICD-10-CM | POA: Diagnosis not present

## 2022-02-28 DIAGNOSIS — I5032 Chronic diastolic (congestive) heart failure: Secondary | ICD-10-CM | POA: Diagnosis not present

## 2022-02-28 DIAGNOSIS — N1330 Unspecified hydronephrosis: Secondary | ICD-10-CM | POA: Diagnosis not present

## 2022-02-28 DIAGNOSIS — N179 Acute kidney failure, unspecified: Secondary | ICD-10-CM | POA: Diagnosis not present

## 2022-02-28 DIAGNOSIS — R1030 Lower abdominal pain, unspecified: Secondary | ICD-10-CM | POA: Diagnosis not present

## 2022-02-28 DIAGNOSIS — N390 Urinary tract infection, site not specified: Secondary | ICD-10-CM | POA: Diagnosis present

## 2022-02-28 LAB — URINALYSIS, ROUTINE W REFLEX MICROSCOPIC
Bilirubin Urine: NEGATIVE
Glucose, UA: >=500 mg/dL — AB
Ketones, ur: 5 mg/dL — AB
Nitrite: NEGATIVE
Protein, ur: >=300 mg/dL — AB
RBC / HPF: >50 RBC/hpf — ABNORMAL HIGH (ref 0–5)
Specific Gravity, Urine: 1.013 (ref 1.005–1.030)
Squamous Epithelial / HPF: NONE SEEN (ref 0–5)
WBC, UA: >50 WBC/hpf — ABNORMAL HIGH (ref 0–5)
pH: 5 (ref 5.0–8.0)

## 2022-02-28 LAB — MAGNESIUM: Magnesium: 2.3 mg/dL (ref 1.7–2.4)

## 2022-02-28 LAB — CBC
HCT: 32.9 % — ABNORMAL LOW (ref 39.0–52.0)
Hemoglobin: 10.4 g/dL — ABNORMAL LOW (ref 13.0–17.0)
MCH: 28.5 pg (ref 26.0–34.0)
MCHC: 31.6 g/dL (ref 30.0–36.0)
MCV: 90.1 fL (ref 80.0–100.0)
Platelets: 299 10*3/uL (ref 150–400)
RBC: 3.65 MIL/uL — ABNORMAL LOW (ref 4.22–5.81)
RDW: 13.5 % (ref 11.5–15.5)
WBC: 12.8 10*3/uL — ABNORMAL HIGH (ref 4.0–10.5)
nRBC: 0 % (ref 0.0–0.2)

## 2022-02-28 LAB — BASIC METABOLIC PANEL
Anion gap: 9 (ref 5–15)
BUN: 89 mg/dL — ABNORMAL HIGH (ref 8–23)
CO2: 21 mmol/L — ABNORMAL LOW (ref 22–32)
Calcium: 8 mg/dL — ABNORMAL LOW (ref 8.9–10.3)
Chloride: 108 mmol/L (ref 98–111)
Creatinine, Ser: 5.91 mg/dL — ABNORMAL HIGH (ref 0.61–1.24)
GFR, Estimated: 9 mL/min — ABNORMAL LOW (ref >60)
Glucose, Bld: 226 mg/dL — ABNORMAL HIGH (ref 70–99)
Potassium: 4.5 mmol/L (ref 3.5–5.1)
Sodium: 138 mmol/L (ref 135–145)

## 2022-02-28 LAB — GLUCOSE, CAPILLARY
Glucose-Capillary: 258 mg/dL — ABNORMAL HIGH (ref 70–99)
Glucose-Capillary: 263 mg/dL — ABNORMAL HIGH (ref 70–99)
Glucose-Capillary: 205 mg/dL — ABNORMAL HIGH (ref 70–99)
Glucose-Capillary: 187 mg/dL — ABNORMAL HIGH (ref 70–99)

## 2022-02-28 LAB — SODIUM, URINE, RANDOM: Sodium, Ur: 41 mmol/L

## 2022-02-28 LAB — CREATININE, URINE, RANDOM: Creatinine, Urine: 95 mg/dL

## 2022-02-28 MED ORDER — INSULIN ASPART 100 UNIT/ML IJ SOLN
10.0000 [IU] | Freq: Three times a day (TID) | INTRAMUSCULAR | Status: DC
  Administered 2022-02-28 – 2022-03-07 (×19): 10 [IU] via SUBCUTANEOUS
  Filled 2022-02-28 (×20): qty 1

## 2022-02-28 MED ORDER — CEFTRIAXONE SODIUM 2 G IJ SOLR
2.0000 g | INTRAMUSCULAR | Status: DC
  Administered 2022-02-28 – 2022-03-07 (×8): 2 g via INTRAVENOUS
  Filled 2022-02-28: qty 2
  Filled 2022-02-28 (×5): qty 20
  Filled 2022-02-28: qty 2
  Filled 2022-02-28: qty 20

## 2022-02-28 MED ORDER — INSULIN DETEMIR 100 UNIT/ML ~~LOC~~ SOLN
30.0000 [IU] | Freq: Every day | SUBCUTANEOUS | Status: DC
  Administered 2022-03-01 – 2022-03-07 (×6): 30 [IU] via SUBCUTANEOUS
  Filled 2022-02-28 (×7): qty 0.3

## 2022-02-28 NOTE — Assessment & Plan Note (Addendum)
   Rocephin initiated 02/28/2022, at 1 day postop left ureteroscopy.  We will discontinue antibiotic as he is afebrile and has no leukocytosis  Cultures pending --> no growth

## 2022-02-28 NOTE — Progress Notes (Signed)
Urology Inpatient Progress Note  Subjective: No acute events overnight.  He is afebrile, VSS. Creatinine up today, 5.91.  WBC count up today, 12.8. Foley catheter in place draining clear, yellow urine. He reports intermittent burning discomfort "when he urinates."  Anti-infectives: Anti-infectives (From admission, onward)    None       Current Facility-Administered Medications  Medication Dose Route Frequency Provider Last Rate Last Admin   acetaminophen (TYLENOL) tablet 650 mg  650 mg Oral Q6H PRN Para Skeans, MD       Or   acetaminophen (TYLENOL) suppository 650 mg  650 mg Rectal Q6H PRN Para Skeans, MD       albuterol (PROVENTIL) (2.5 MG/3ML) 0.083% nebulizer solution 2.5 mg  2.5 mg Inhalation Q6H PRN Para Skeans, MD       amLODipine (NORVASC) tablet 10 mg  10 mg Oral Daily Enzo Bi, MD   10 mg at 02/28/22 1025   aspirin EC tablet 81 mg  81 mg Oral Daily Para Skeans, MD   81 mg at 02/28/22 0807   heparin injection 5,000 Units  5,000 Units Subcutaneous Q8H Para Skeans, MD   5,000 Units at 02/28/22 0612   hydrALAZINE (APRESOLINE) injection 10 mg  10 mg Intravenous Q4H PRN Para Skeans, MD       hydrALAZINE (APRESOLINE) tablet 50 mg  50 mg Oral Q8H Enzo Bi, MD   50 mg at 02/28/22 8527   HYDROcodone-acetaminophen (NORCO/VICODIN) 5-325 MG per tablet 1 tablet  1 tablet Oral Q4H PRN Para Skeans, MD       insulin aspart (novoLOG) injection 0-15 Units  0-15 Units Subcutaneous TID WC Para Skeans, MD   8 Units at 02/28/22 0808   insulin aspart (novoLOG) injection 8 Units  8 Units Subcutaneous TID WC Enzo Bi, MD   8 Units at 02/28/22 0808   insulin detemir (LEVEMIR) injection 25 Units  25 Units Subcutaneous Daily Enzo Bi, MD   25 Units at 02/28/22 7824   metoprolol succinate (TOPROL-XL) 24 hr tablet 50 mg  50 mg Oral Daily Para Skeans, MD   50 mg at 02/28/22 0807   polyethylene glycol (MIRALAX / GLYCOLAX) packet 34 g  34 g Oral BID Enzo Bi, MD   34 g at 02/28/22 0807    sodium chloride flush (NS) 0.9 % injection 3 mL  3 mL Intravenous Q12H Florina Ou V, MD   3 mL at 02/28/22 0809   tamsulosin (FLOMAX) capsule 0.4 mg  0.4 mg Oral Daily Stoioff, Scott C, MD   0.4 mg at 02/28/22 2353   Objective: Vital signs in last 24 hours: Temp:  [97.1 F (36.2 C)-98.2 F (36.8 C)] 98.2 F (36.8 C) (08/10 0728) Pulse Rate:  [76-92] 78 (08/10 0728) Resp:  [16-20] 20 (08/10 0330) BP: (114-146)/(47-67) 127/66 (08/10 0728) SpO2:  [93 %-99 %] 97 % (08/10 0728) Weight:  [146.1 kg-150.6 kg] 146.1 kg (08/10 0330)  Intake/Output from previous day: 08/09 0701 - 08/10 0700 In: 1100 [P.O.:480; I.V.:600; IV Piggyback:20] Out: 6144 [RXVQM:0867; Blood:1] Intake/Output this shift: No intake/output data recorded.  Physical Exam Vitals and nursing note reviewed.  Constitutional:      General: He is not in acute distress.    Appearance: He is not ill-appearing, toxic-appearing or diaphoretic.  HENT:     Head: Normocephalic and atraumatic.  Pulmonary:     Effort: Pulmonary effort is normal. No respiratory distress.  Skin:    General: Skin is  warm and dry.  Neurological:     Mental Status: He is alert and oriented to person, place, and time.  Psychiatric:        Mood and Affect: Mood normal.        Behavior: Behavior normal.    Lab Results:  Recent Labs    02/27/22 0410 02/28/22 0402  WBC 9.5 12.8*  HGB 10.0* 10.4*  HCT 31.4* 32.9*  PLT 278 299   BMET Recent Labs    02/27/22 0410 02/28/22 0402  NA 139 138  K 4.0 4.5  CL 110 108  CO2 21* 21*  GLUCOSE 230* 226*  BUN 83* 89*  CREATININE 5.87* 5.91*  CALCIUM 8.0* 8.0*   Assessment & Plan: 81 year old male with CKD 4, diabetes, hypertension, and nephrolithiasis admitted with acute renal failure in the setting of 2 distal left ureteral calculi, now POD 1 from ureteroscopy with laser lithotripsy and stent placement with Dr. Bernardo Heater.  Creatinine and white count are uptrending today.  He reports burning  discomfort with urination despite Foley catheter being in place.  I think his symptoms most likely represent stent discomfort/bladder spasms in the setting of Foley catheter, however I have ordered a UA to rule out postop UTI.  If UA is negative, recommend continuing Flomax 0.4 mg daily and can consider starting oxybutynin 5 mg 3 times daily as needed for bladder spasms.  Will continue to monitor his renal function.  If it does not begin to downtrend tomorrow, may consider repeat imaging to rule out persistent urinary obstruction, though his urine output appears appropriate today.  Depending on how his renal function recovers, may consider inpatient versus outpatient voiding trial.  He will require outpatient cystoscopy stent removal with Dr. Bernardo Heater in about 1 week.  Debroah Loop, PA-C 02/28/2022

## 2022-02-28 NOTE — Progress Notes (Signed)
PROGRESS NOTE    James Clarke   ZOX:[id number] DOB: 08/28/1940  DOA: 02/25/2022 Date of Service: 02/28/22 PCP: Sofie Hartigan, MD     Brief Narrative / Hospital Course:  James Clarke is a 81 y.o. male with past medical history of chronic kidney disease 4, hypertension, diabetes mellitus type 2, chronic CHF and bilateral leg edema (Echo 218 2018 may shows normal left ventricular cavity size systolic function normal ejection fraction between 50 to 55%), kidney stones, obstructive sleep apnea who presented 02/25/2022 with abdominal pain evaluated in the urgent care initially and was referred to the emergency room after being told that he has acute kidney injury. 08/07: BUN of 80 creatinine of 5.61 (baseline approx 2.5), GFR of 10, WBC 13.3, UA more than 300 protein and small hemoglobin with 0-5 RBCs and 0-5 WBCs. CT Abd/Pelv: stone at the left UVJ with accompanying hydronephrosis.  Renal US: unremarkable. Admitted for ARF on CKD3 suspected prerenal d/t obstructing 2-3 mm calculi within the distal left ureter, and dehydration from decreased po intake plus home lasix and Valsartan use. DVT study given edema but more likely amlodipine effect and hypoalbuminemia.  08/08: seen by urology and nephrology. Echo done: LVEF 55-60 w/ G2 DD.  08/09: Patient underwent left ureteroscopy  08/10: Cr 5.91, UA abnormal, started Rocephin for UTI pending cultures, will repeat urine lytes to confirm FeNa     Consultants:  Urology Nephrology   Procedures: Echo L ureteroscopy 02/27/22    Subjective: Patient reports still feeling tired/"worn out" today, still burning sensation in urethra/suprapubic, no fever/chills, no chest pain or shortness of breath.     ASSESSMENT & PLAN:   Principal Problem:   Acute renal failure superimposed on stage 4 chronic kidney disease (HCC) Active Problems:   Abdominal pain   Type 2 diabetes mellitus with renal manifestations (HCC)   Sleep apnea    Hypertension   Bilateral leg edema   Left ureteral calculus   Chronic diastolic CHF (congestive heart failure) (HCC)   Hydronephrosis of left kidney   UTI (urinary tract infection)   Abdominal pain CT Abd/Pelv showed Obstructing 2-3 mm calculi within the distal left ureter, however, pt's abdominal pain was across lower abdomen, unlike pt's prior episode of kidney stone pains.   Abdominal pain improved some with BM. Pain control Foley in place Urology following   Acute renal failure superimposed on stage 4 chronic kidney disease (HCC) Cr 5.61 on presentation.  Baseline around 2.5. AKI due to obstruction, from obstructing 2-3 mm calculi within the distal left ureter, and/or dehydration from home lasix and Valsartan use. Nephrology and urology following  Serial BMP  Caution w/ IV fluids to avoid CHF exacerbation  Urine ordered again 08/10 to confirm FeNa --> 1.8 concerning for intrarenal pathology Treating for UTI as above  Type 2 diabetes mellitus with renal manifestations (HCC) A1c 6.4, well controlled.   Hyperglycemia due to not resumed on home insulin Plan: resume home Levemir at 25u daily add mealtime 8u TID  Sleep apnea CPAP per home setting.  Hypertension hold home lasix and Valsartan due to AKI cont home Toprol add hydralazine 50 mg Q8h due to elevated BP  Bilateral leg edema Not currently edematous. hold home lasix 2/2 AKI  Chronic diastolic CHF (congestive heart failure) (HCC) not in acute exacerbation hold home lasix 2/2 AKI Echo done this admission  Hydronephrosis of left kidney Obstructing 2-3 mm calculi within the distal left ureter just proximal to the left ureterovesicular junction resulting in  mild left hydronephrosis and moderate perinephric stranding. urology L ureteroscopy 08/09  Left ureteral calculus Urology: s/p L ureteroscopy 08/09  UTI (urinary tract infection) Rocephin initiated 02/28/2022, he is 1 day postop left ureteroscopy Cultures  pending     DVT prophylaxis: heparin Code Status: FULL Family Communication: wife at bedside  Disposition Plan / TOC needs: none at this time  Barriers to discharge / significant pending items: pending urology recs / discharge appropriateness postop, still AKI/ARF, anticipate will be here at least a few more days/into the weekend (today is Thursday)             Objective: Vitals:   02/27/22 1950 02/28/22 0330 02/28/22 0613 02/28/22 0728  BP: (!) 144/67 (!) 114/47 126/60 127/66  Pulse: 92 76  78  Resp: 16 20    Temp: 97.8 F (36.6 C) 97.6 F (36.4 C)  98.2 F (36.8 C)  TempSrc: Oral Oral    SpO2: 97% 95%  97%  Weight:  (!) 146.1 kg    Height:        Intake/Output Summary (Last 24 hours) at 02/28/2022 1340 Last data filed at 02/27/2022 2300 Gross per 24 hour  Intake 480 ml  Output 1075 ml  Net -595 ml   Filed Weights   02/27/22 0400 02/27/22 0940 02/28/22 0330  Weight: (!) 146.2 kg (!) 150.6 kg (!) 146.1 kg    Examination:  Constitutional:  VS as above General Appearance: alert, well-nourished, NAD Ears, Nose, Mouth, Throat: Normal appearance Neck: No masses, trachea midline Respiratory: Normal respiratory effort Breath sounds normal, no wheeze/rhonchi/rales Cardiovascular: S1/S2 normal, no murmur/rub/gallop auscultated No lower extremity edema Gastrointestinal: Nontender, no masses Musculoskeletal:  No clubbing/cyanosis of digits Neurological: No cranial nerve deficit on limited exam Psychiatric: Normal judgment/insight Normal mood and affect       Scheduled Medications:   amLODipine  10 mg Oral Daily   aspirin EC  81 mg Oral Daily   heparin  5,000 Units Subcutaneous Q8H   hydrALAZINE  50 mg Oral Q8H   insulin aspart  0-15 Units Subcutaneous TID WC   insulin aspart  8 Units Subcutaneous TID WC   insulin detemir  25 Units Subcutaneous Daily   metoprolol succinate  50 mg Oral Daily   polyethylene glycol  34 g Oral BID   sodium  chloride flush  3 mL Intravenous Q12H   tamsulosin  0.4 mg Oral Daily    Continuous Infusions:  cefTRIAXone (ROCEPHIN)  IV      PRN Medications:  acetaminophen **OR** acetaminophen, albuterol, hydrALAZINE, HYDROcodone-acetaminophen  Antimicrobials:  Anti-infectives (From admission, onward)    Start     Dose/Rate Route Frequency Ordered Stop   02/28/22 1300  cefTRIAXone (ROCEPHIN) 2 g in sodium chloride 0.9 % 100 mL IVPB        2 g 200 mL/hr over 30 Minutes Intravenous Every 24 hours 02/28/22 1208         Data Reviewed: I have personally reviewed following labs and imaging studies  CBC: Recent Labs  Lab 02/25/22 1316 02/26/22 0948 02/27/22 0410 02/28/22 0402  WBC 13.3* 10.3 9.5 12.8*  NEUTROABS 9.9*  --   --   --   HGB 11.3* 11.7* 10.0* 10.4*  HCT 36.2* 37.0* 31.4* 32.9*  MCV 91.4 90.2 89.5 90.1  PLT 290 280 278 678   Basic Metabolic Panel: Recent Labs  Lab 02/25/22 1005 02/25/22 1316 02/26/22 0948 02/27/22 0410 02/28/22 0402  NA 135  --  138 139 138  K 3.9  --  4.3 4.0 4.5  CL 105  --  108 110 108  CO2 23  --  20* 21* 21*  GLUCOSE 118*  --  296* 230* 226*  BUN 80*  --  67* 83* 89*  CREATININE 5.61*  --  5.57* 5.87* 5.91*  CALCIUM 8.4*  --  8.4* 8.0* 8.0*  MG  --  2.2  --  2.3 2.3  PHOS  --  5.4*  --   --   --    GFR: Estimated Creatinine Clearance: 14.2 mL/min (A) (by C-G formula based on SCr of 5.91 mg/dL (H)). Liver Function Tests: Recent Labs  Lab 02/25/22 1005 02/26/22 0948  AST 19 21  ALT 14 15  ALKPHOS 71 69  BILITOT 0.5 0.7  PROT 7.2 7.0  ALBUMIN 2.8* 2.7*   No results for input(s): "LIPASE", "AMYLASE" in the last 168 hours. No results for input(s): "AMMONIA" in the last 168 hours. Coagulation Profile: No results for input(s): "INR", "PROTIME" in the last 168 hours. Cardiac Enzymes: No results for input(s): "CKTOTAL", "CKMB", "CKMBINDEX", "TROPONINI" in the last 168 hours. BNP (last 3 results) No results for input(s): "PROBNP" in  the last 8760 hours. HbA1C: No results for input(s): "HGBA1C" in the last 72 hours.  CBG: Recent Labs  Lab 02/27/22 1143 02/27/22 1628 02/27/22 2042 02/28/22 0730 02/28/22 1131  GLUCAP 187* 268* 289* 258* 263*   Lipid Profile: No results for input(s): "CHOL", "HDL", "LDLCALC", "TRIG", "CHOLHDL", "LDLDIRECT" in the last 72 hours. Thyroid Function Tests: No results for input(s): "TSH", "T4TOTAL", "FREET4", "T3FREE", "THYROIDAB" in the last 72 hours.  Anemia Panel: No results for input(s): "VITAMINB12", "FOLATE", "FERRITIN", "TIBC", "IRON", "RETICCTPCT" in the last 72 hours. Urine analysis:    Component Value Date/Time   COLORURINE YELLOW (A) 02/28/2022 0958   APPEARANCEUR CLOUDY (A) 02/28/2022 0958   APPEARANCEUR Hazy (A) 08/19/2019 1113   LABSPEC 1.013 02/28/2022 0958   LABSPEC 1.008 11/27/2013 0810   PHURINE 5.0 02/28/2022 0958   GLUCOSEU >=500 (A) 02/28/2022 0958   GLUCOSEU Negative 11/27/2013 0810   HGBUR LARGE (A) 02/28/2022 0958   BILIRUBINUR NEGATIVE 02/28/2022 0958   BILIRUBINUR Negative 08/19/2019 1113   BILIRUBINUR Negative 11/27/2013 0810   KETONESUR 5 (A) 02/28/2022 0958   PROTEINUR >=300 (A) 02/28/2022 0958   NITRITE NEGATIVE 02/28/2022 0958   LEUKOCYTESUR MODERATE (A) 02/28/2022 0958   LEUKOCYTESUR Negative 11/27/2013 0810   Sepsis Labs: '@LABRCNTIP'$ (procalcitonin:4,lacticidven:4)  No results found for this or any previous visit (from the past 240 hour(s)).       Radiology Studies last 96 hours: DG OR UROLOGY CYSTO IMAGE ([place])  Result Date: 02/27/2022 There is no interpretation for this exam.  This order is for images obtained during a surgical procedure.  Please See "Surgeries" Tab for more information regarding the procedure.   ECHOCARDIOGRAM COMPLETE  Result Date: 02/26/2022    ECHOCARDIOGRAM REPORT   Patient Name:   JAMAN GENE Pollard Date of Exam: 02/26/2022 Medical Rec #:  [id number]       Height:       70.0 in Accession #:    [id number]       Weight:       328.5 lb Date of Birth:  13-Apr-1941      BSA:          2.577 m Patient Age:    45 years        BP:           156/72 mmHg Patient Gender: M  HR:           77 bpm. Exam Location:  [facility] Procedure: 2D Echo, Color Doppler, Cardiac Doppler and Intracardiac            Opacification Agent Indications:     CHF-acute systolic I33.82  History:         Patient has prior history of Echocardiogram examinations, most                  recent 11/26/2016. Signs/Symptoms:Dyspnea; Risk Factors:Diabetes,                  Hypertension and Sleep Apnea.  Sonographer:     Sherrie Sport Referring Phys:  [place] Diagnosing Phys: Yolonda Kida MD  Sonographer Comments: Technically challenging study due to limited acoustic windows, suboptimal apical window and no subcostal window. IMPRESSIONS  1. Left ventricular ejection fraction, by estimation, is 55 to 60%. The left ventricle has normal function. The left ventricle has no regional wall motion abnormalities. The left ventricular internal cavity size was mildly dilated. Left ventricular diastolic parameters are consistent with Grade II diastolic dysfunction (pseudonormalization).  2. Right ventricular systolic function is normal. The right ventricular size is normal.  3. Left atrial size was mildly dilated.  4. Right atrial size was mildly dilated.  5. The mitral valve is normal in structure. Trivial mitral valve regurgitation.  6. The aortic valve is grossly normal. Aortic valve regurgitation is not visualized. FINDINGS  Left Ventricle: Left ventricular ejection fraction, by estimation, is 55 to 60%. The left ventricle has normal function. The left ventricle has no regional wall motion abnormalities. Definity contrast agent was given IV to delineate the left ventricular  endocardial borders. The left ventricular internal cavity size was mildly dilated. There is no left ventricular hypertrophy. Left ventricular diastolic parameters are consistent with  Grade II diastolic dysfunction (pseudonormalization). Right Ventricle: The right ventricular size is normal. No increase in right ventricular wall thickness. Right ventricular systolic function is normal. Left Atrium: Left atrial size was mildly dilated. Right Atrium: Right atrial size was mildly dilated. Pericardium: There is no evidence of pericardial effusion. Mitral Valve: The mitral valve is normal in structure. Trivial mitral valve regurgitation. Tricuspid Valve: The tricuspid valve is normal in structure. Tricuspid valve regurgitation is trivial. Aortic Valve: The aortic valve is grossly normal. Aortic valve regurgitation is not visualized. Aortic valve mean gradient measures 6.0 mmHg. Aortic valve peak gradient measures 9.1 mmHg. Aortic valve area, by VTI measures 2.56 cm. Pulmonic Valve: The pulmonic valve was normal in structure. Pulmonic valve regurgitation is not visualized. Aorta: The ascending aorta was not well visualized. IAS/Shunts: No atrial level shunt detected by color flow Doppler.  LEFT VENTRICLE PLAX 2D LVIDd:         5.10 cm   Diastology LVIDs:         3.60 cm   LV e' medial:    2.94 cm/s LV PW:         1.50 cm   LV E/e' medial:  27.8 LV IVS:        1.50 cm   LV e' lateral:   6.64 cm/s LVOT diam:     2.00 cm   LV E/e' lateral: 12.3 LV SV:         65 LV SV Index:   25 LVOT Area:     3.14 cm  RIGHT VENTRICLE RV Basal diam:  4.50 cm RV S prime:     12.30  cm/s TAPSE (M-mode): 3.4 cm LEFT ATRIUM              Index        RIGHT ATRIUM           Index LA diam:        4.10 cm  1.59 cm/m   RA Area:     33.70 cm LA Vol (A2C):   114.0 ml 44.23 ml/m  RA Volume:   123.00 ml 47.72 ml/m LA Vol (A4C):   123.0 ml 47.72 ml/m LA Biplane Vol: 119.0 ml 46.17 ml/m  AORTIC VALVE AV Area (Vmax):    2.10 cm AV Area (Vmean):   1.98 cm AV Area (VTI):     2.56 cm AV Vmax:           151.00 cm/s AV Vmean:          110.000 cm/s AV VTI:            0.253 m AV Peak Grad:      9.1 mmHg AV Mean Grad:      6.0 mmHg  LVOT Vmax:         101.00 cm/s LVOT Vmean:        69.200 cm/s LVOT VTI:          0.206 m LVOT/AV VTI ratio: 0.81  AORTA Ao Root diam: 4.07 cm MITRAL VALVE               TRICUSPID VALVE MV Area (PHT): 4.57 cm    TR Peak grad:   7.6 mmHg MV Decel Time: 166 msec    TR Vmax:        138.00 cm/s MV E velocity: 81.80 cm/s MV A velocity: 53.60 cm/s  SHUNTS MV E/A ratio:  1.53        Systemic VTI:  0.21 m                            Systemic Diam: 2.00 cm Dwayne Prince [place] MD Electronically signed by Yolonda Kida MD Signature Date/Time: 02/26/2022/1:51:02 PM    Final    CT ABDOMEN PELVIS WO CONTRAST  Result Date: 02/26/2022 CLINICAL DATA:  Abdominal pain, acute, nonlocalized abd pain/ flank pain/acute kidney injury. EXAM: CT ABDOMEN AND PELVIS WITHOUT CONTRAST TECHNIQUE: Multidetector CT imaging of the abdomen and pelvis was performed following the standard protocol without IV contrast. RADIATION DOSE REDUCTION: This exam was performed according to the departmental dose-optimization program which includes automated exposure control, adjustment of the mA and/or kV according to patient size and/or use of iterative reconstruction technique. COMPARISON:  07/26/2019 FINDINGS: Lower chest: Mildly progressive subpleural bibasilar pulmonary fibrotic change. Extensive coronary artery calcification. Global cardiac size within normal limits. Stable trace pericardial effusion. Hepatobiliary: No focal liver abnormality is seen. Status post cholecystectomy. No biliary dilatation. Pancreas: Unremarkable Spleen: Unremarkable Adrenals/Urinary Tract: The adrenal glands are unremarkable. The kidneys are normal in size and position. There is mild left hydronephrosis and moderate perinephric stranding secondary to 2 obstructing calculi measuring 2-3 mm in size within the distal left ureter just proximal to the left ureterovesicular junction. There is superimposed bilateral nonobstructing nephrolithiasis with calculi measuring up to 10 mm  in greatest dimension bilaterally. No ureteral calculi on the right. No hydronephrosis on the right. Simple exophytic cortical cyst arises from the upper pole of the right kidney. No follow-up imaging is recommended for this lesion. The bladder is unremarkable. Stomach/Bowel: Moderate sigmoid diverticulosis without  superimposed acute inflammatory change. Stomach, small bowel, and large bowel are otherwise unremarkable. Appendix absent. No free intraperitoneal gas or fluid. Vascular/Lymphatic: Mild aortoiliac atherosclerotic calcification. No aortic aneurysm. No pathologic adenopathy within the abdomen and pelvis. Reproductive: Mild prostatic hypertrophy. Other: Small fat containing umbilical and bilateral inguinal hernias. Rectum unremarkable. Musculoskeletal: No acute bone abnormality. No lytic or blastic bone lesion. IMPRESSION: 1. Obstructing 2-3 mm calculi within the distal left ureter just proximal to the left ureterovesicular junction resulting in mild left hydronephrosis and moderate perinephric stranding. 2. Superimposed moderate bilateral nonobstructing nephrolithiasis. 3. Moderate sigmoid diverticulosis without superimposed acute inflammatory change. 4. Extensive coronary artery calcification. 5. Mildly progressive bibasilar pulmonary fibrosis. Aortic Atherosclerosis (ICD10-I70.0). Electronically Signed   By: Fidela Salisbury M.D.   On: 02/26/2022 02:34   US Renal  Result Date: 02/25/2022 CLINICAL DATA:  Worsening renal function. EXAM: RENAL / URINARY TRACT ULTRASOUND COMPLETE COMPARISON:  None Available. FINDINGS: Right Kidney: Renal measurements: 12.1 x 5.7 x 4.9 cm = volume: 175 mL. Mild renal cortical thinning and increased echogenicity. No hydronephrosis. Multiple cysts within the right kidney measuring up to 4.2 cm. Left Kidney: Renal measurements: 12.3 x 6.4 x 5.5 cm = volume: 225 mL. Renal cortical thinning. No hydronephrosis. No renal mass. Bladder: Appears normal for degree of bladder  distention. Other: None. IMPRESSION: No hydronephrosis. Mild renal cortical thinning bilaterally, raising the possibility of chronic medical renal disease. Electronically Signed   By: Lovey Newcomer M.D.   On: 02/25/2022 15:21   DG Abdomen 1 View  Result Date: 02/25/2022 CLINICAL DATA:  Abdominal pain and dysuria. Urinary tract infection symptoms. EXAM: ABDOMEN - 1 VIEW COMPARISON:  Abdominal radiograph 02/19/2021 FINDINGS: Redemonstrated opacities within the lung base, nonspecific. Nonobstructed bowel-gas pattern. Cholecystectomy clips. Right hemipelvis phlebolith. Lumbar spine degenerative changes. Stool throughout the colon. IMPRESSION: Nonobstructed bowel-gas pattern. No definite radiodensities to suggest nephrolithiasis. Electronically Signed   By: Lovey Newcomer M.D.   On: 02/25/2022 10:24            LOS: 2 days     Emeterio Reeve, DO [facility] 02/28/2022, 1:40 PM   Staff may message me via secure chat in [place]  but this may not receive immediate response,  please page for urgent matters!  If 7PM-7AM, please contact night-coverage [email or web address]  Dictation software was used to generate the above note. Typos may occur and escape review, as with typed/written notes. Please contact Dr Sheppard Coil directly for clarity if needed.

## 2022-02-28 NOTE — Op Note (Signed)
Preoperative diagnosis: Left distal ureteral calculi  Postoperative diagnosis: Same  Procedure:  Cystoscopy Left ureteroscopy and stone removal Ureteroscopic laser lithotripsy Left ureteral stent placement (41F/26 cm)  Left retrograde pyelography with interpretation  Surgeon: Nicki Reaper C. Jadia Capers, M.D.  Anesthesia: General  Complications: None  Intraoperative findings:  Cystoscopy-urethra normal in caliber without stricture.  Mild lateral lobe enlargement with marked bladder neck elevation.  UOs normal-appearing bilaterally.  No solid or papillary lesions identified Left ureteroscopy mucosal inflammation and edema just distal to 3 ureteral calculi measuring ~ 3 mm Left retrograde pyelography post procedure showed no filling defects, or stone fragments.  Mild contrast extravasation noted in the region of the inflamed mucosa distal ureter  EBL: Minimal  Specimens: Calculus fragments for analysis   Indication: Orby Tangen is a 81 y.o. with stage IV CKD admitted with bump in creatinine up to 2 range-mid 5 range.  Renal ultrasound was negative.  Noncontrast CT showed mild left hydronephrosis secondary to distal ureteral calculi.  After reviewing the management options for treatment, the patient elected to proceed with the above surgical procedure(s). We have discussed the potential benefits and risks of the procedure, side effects of the proposed treatment, the likelihood of the patient achieving the goals of the procedure, and any potential problems that might occur during the procedure or recuperation. Informed consent has been obtained.  Description of procedure:  The patient was taken to the operating room and general anesthesia was induced.  The patient was placed in the dorsal lithotomy position, prepped and draped in the usual sterile fashion, and preoperative antibiotics were administered. A preoperative time-out was performed.   A 21 French cystoscope was lubricated, passed per  urethra and advanced proximally into the bladder under direct vision with findings as described above.  The bladder appeared distended on introduction of the cystoscope into the bladder.  Attention was directed to the left ureteral orifice and a 0.038 Sensor wire was then advanced up the  ureter into the renal pelvis under fluoroscopic guidance.  Once advanced beyond the distal ureter there was brisk efflux of slightly cloudy urine noted.  A 4.5 Fr semirigid ureteroscope was then advanced into the ureter next to the guidewire.  The ureteroscope was unable to be advanced beyond the area of mucosal inflammation noted above.  A second Sensor wire was placed through the ureteroscope and advanced through this narrowed area.  The ureteroscope was then slowly advanced over the wire through this narrowed area where the calculi were identified.  Due to the narrowed area mucosa it was elected to fragment the calculi.  A 200 m holmium laser fiber was placed through the ureteroscope and the calculi were partially fragmented at a setting of 0.3J/40 Hz.  All fragments were then removed with a 1.9 [place] nitinol basket.  The ureteroscope was then advanced to the lower proximal ureter no additional fragments were seen.  Pullout retrograde pyelogram was performed through the ureteroscope with findings as described above.  A 41F/26 cm Contour ureteral stent was then placed under fluoroscopic guidance.  Good curl was noted in the upper pole calyx and the distal end stent was well-positioned in the bladder.  Due to the appearance of bladder distention it was elected to place a Foley catheter for maximum urinary drainage.  An 62 French Foley catheter was advanced into the bladder without difficulty and balloon inflated with 10 cc of sterile water.  Catheter was left to gravity drainage.  The patient appeared to tolerate the procedure well  and without complications.  After anesthetic reversal the patient was transported to the  PACU in stable condition.   Plan: Indwelling Foley catheter for approximately 1 week Leave stent indwelling approximately 3 weeks.  Will schedule office follow-up for removal and voiding trial   John Giovanni, MD

## 2022-02-28 NOTE — Progress Notes (Signed)
Mobility Specialist - Progress Note   Pre-mobility: SpO2 98% During mobility: SpO2 96% Post-mobility: SPO2 99%     02/28/22 1431  Mobility  Activity Ambulated with assistance in hallway  Level of Assistance Standby assist, set-up cues, supervision of patient - no hands on  Assistive Device Front wheel walker  Distance Ambulated (ft) 180 ft  Activity Response Tolerated well  $Mobility charge 1 Mobility   Pt at EOB upon entry. Pt utilizing RA. Pt stood up min assist. Pt ambulated one lap around NS with one rest break. Pt left supine with needs in reach. Family at bedside.   Candie [place] Mobility Specialist 02/28/22 2:35 PM

## 2022-02-28 NOTE — Plan of Care (Signed)

## 2022-02-28 NOTE — Care Management Important Message (Signed)
Important Message  Patient Details  Name: James Clarke MRN: [id number] Date of Birth: 11/11/1940   Medicare Important Message Given:  N/A - LOS <3 / Initial given by admissions     [name] 02/28/2022, 1:25 PM

## 2022-02-28 NOTE — Inpatient Diabetes Management (Signed)
Inpatient Diabetes Program Recommendations  AACE/ADA: New Consensus Statement on Inpatient Glycemic Control (2015)  Target Ranges:  Prepandial:   less than 140 mg/dL      Peak postprandial:   less than 180 mg/dL (1-2 hours)      Critically ill patients:  140 - 180 mg/dL   Lab Results  Component Value Date   GLUCAP 258 (H) 02/28/2022   HGBA1C 6.4 (H) 02/25/2022    Review of Glycemic Control  Latest Reference Range & Units 02/27/22 16:28 02/27/22 20:42 02/28/22 07:30  Glucose-Capillary 70 - 99 mg/dL 268 (H) 289 (H) 258 (H)  (H): Data is abnormally high  Diabetes history: DM2 Outpatient Diabetes medications: Levemir 50 units qd, Novolog ac meals 30-30-55 units Current orders for Inpatient glycemic control: Levemir 25 units qd, Novolog 8 units tid meal coverage if eats 50% mealsNovolog 0-15 units tid  Inpatient Diabetes Program Recommendations:   Please consider: -Increase Levemir to 30 units qd -Increase Novolog meal coverage to 10 tid if eats 50%  Thank you, Bethena Roys E. Melea Prezioso, RN, MSN, CDE  Diabetes Coordinator Inpatient Glycemic Control Team Team Pager [phone] (8am-5pm) 02/28/2022 10:22 AM

## 2022-02-28 NOTE — Progress Notes (Signed)
[facility]  ROUNDING NOTE   Subjective:   James Clarke is a 81 year old male with past medical conditions including Bell's palsy, diabetes, hypertension, hyperlipidemia, sleep apnea on CPAP, and chronic kidney disease stage IV.  Patient presents to the emergency department at the advice of his nephrologist for abnormal renal function.  Patient has been admitted for Bilateral flank pain [R10.9] Suprapubic abdominal pain [R10.2] Abdominal pain [R10.9] Acute renal failure superimposed on chronic kidney disease, unspecified CKD stage, unspecified acute renal failure type ([place]) [N17.9, N18.9] AKI (acute kidney injury) ([place]) [N17.9]  Patient is known to our practice and receives follow-up care by Dr. Holley Raring.   Patient seen resting quietly, wife at bedside Complains of fatigue, recently completed therapy session.  Denies shortness of breath Denies discomfort.    Objective:  Vital signs in last 24 hours:  Temp:  [97.6 F (36.4 C)-98.2 F (36.8 C)] 98.2 F (36.8 C) (08/10 0728) Pulse Rate:  [76-92] 78 (08/10 0728) Resp:  [16-20] 20 (08/10 0330) BP: (114-144)/(47-67) 127/66 (08/10 0728) SpO2:  [95 %-99 %] 97 % (08/10 0728) Weight:  [146.1 kg] 146.1 kg (08/10 0330)  Weight change: 4.394 kg Filed Weights   02/27/22 0400 02/27/22 0940 02/28/22 0330  Weight: (!) 146.2 kg (!) 150.6 kg (!) 146.1 kg    Intake/Output: I/O last 3 completed shifts: In: 1100 [P.O.:480; I.V.:600; IV Piggyback:20] Out: 4098 [Urine:1625; Blood:1]   Intake/Output this shift:  No intake/output data recorded.  Physical Exam: General: NAD  Head: Normocephalic, atraumatic. Moist oral mucosal membranes  Eyes: Anicteric  Lungs:  Clear to auscultation, normal effort, room air  Heart: Regular rate and rhythm  Abdomen:  Soft, nontender, obese  Extremities: Trace peripheral edema.  Neurologic: Nonfocal, moving all four extremities  Skin: No lesions  Access: None    Basic Metabolic  Panel: Recent Labs  Lab 02/25/22 1005 02/25/22 1316 02/26/22 0948 02/27/22 0410 02/28/22 0402  NA 135  --  138 139 138  K 3.9  --  4.3 4.0 4.5  CL 105  --  108 110 108  CO2 23  --  20* 21* 21*  GLUCOSE 118*  --  296* 230* 226*  BUN 80*  --  67* 83* 89*  CREATININE 5.61*  --  5.57* 5.87* 5.91*  CALCIUM 8.4*  --  8.4* 8.0* 8.0*  MG  --  2.2  --  2.3 2.3  PHOS  --  5.4*  --   --   --      Liver Function Tests: Recent Labs  Lab 02/25/22 1005 02/26/22 0948  AST 19 21  ALT 14 15  ALKPHOS 71 69  BILITOT 0.5 0.7  PROT 7.2 7.0  ALBUMIN 2.8* 2.7*    No results for input(s): "LIPASE", "AMYLASE" in the last 168 hours. No results for input(s): "AMMONIA" in the last 168 hours.  CBC: Recent Labs  Lab 02/25/22 1316 02/26/22 0948 02/27/22 0410 02/28/22 0402  WBC 13.3* 10.3 9.5 12.8*  NEUTROABS 9.9*  --   --   --   HGB 11.3* 11.7* 10.0* 10.4*  HCT 36.2* 37.0* 31.4* 32.9*  MCV 91.4 90.2 89.5 90.1  PLT 290 280 278 299     Cardiac Enzymes: No results for input(s): "CKTOTAL", "CKMB", "CKMBINDEX", "TROPONINI" in the last 168 hours.  BNP: Invalid input(s): "POCBNP"  CBG: Recent Labs  Lab 02/27/22 1143 02/27/22 1628 02/27/22 2042 02/28/22 0730 02/28/22 1131  GLUCAP 187* 268* 289* 258* 263*     Microbiology: Results for  orders placed or performed during the hospital encounter of 09/08/20  SARS CORONAVIRUS 2 (TAT 6-24 HRS) Nasopharyngeal Nasopharyngeal Swab     Status: None   Collection Time: 09/08/20 11:54 AM   Specimen: Nasopharyngeal Swab  Result Value Ref Range Status   SARS Coronavirus 2 NEGATIVE NEGATIVE Final    Comment: (NOTE) SARS-CoV-2 target nucleic acids are NOT DETECTED.  The SARS-CoV-2 RNA is generally detectable in upper and lower respiratory specimens during the acute phase of infection. Negative results do not preclude SARS-CoV-2 infection, do not rule out co-infections with other pathogens, and should not be used as the sole basis for  treatment or other patient management decisions. Negative results must be combined with clinical observations, patient history, and epidemiological information. The expected result is Negative.  Fact Sheet for Patients: SugarRoll.be  Fact Sheet for Healthcare Providers: https://www.woods-mathews.com/  This test is not yet approved or cleared by the [place] FDA and  has been authorized for detection and/or diagnosis of SARS-CoV-2 by FDA under an Emergency Use Authorization (EUA). This EUA will remain  in effect (meaning this test can be used) for the duration of the COVID-19 declaration under Se ction 564(b)(1) of the Act, 21 U.S.C. section 360bbb-3(b)(1), unless the authorization is terminated or revoked sooner.  Performed at [facility], [place] [address] [place], [place] [address]     Coagulation Studies: No results for input(s): "LABPROT", "INR" in the last 72 hours.  Urinalysis: Recent Labs    02/28/22 0958  COLORURINE YELLOW*  LABSPEC 1.013  PHURINE 5.0  GLUCOSEU >=500*  HGBUR LARGE*  BILIRUBINUR NEGATIVE  KETONESUR 5*  PROTEINUR >=300*  NITRITE NEGATIVE  LEUKOCYTESUR MODERATE*       Imaging: DG OR UROLOGY CYSTO IMAGE ([facility] ONLY)  Result Date: 02/27/2022 There is no interpretation for this exam.  This order is for images obtained during a surgical procedure.  Please See "Surgeries" Tab for more information regarding the procedure.     Medications:    cefTRIAXone (ROCEPHIN)  IV      amLODipine  10 mg Oral Daily   aspirin EC  81 mg Oral Daily   heparin  5,000 Units Subcutaneous Q8H   hydrALAZINE  50 mg Oral Q8H   insulin aspart  0-15 Units Subcutaneous TID WC   insulin aspart  8 Units Subcutaneous TID WC   insulin detemir  25 Units Subcutaneous Daily   metoprolol succinate  50 mg Oral Daily   polyethylene glycol  34 g Oral BID   sodium chloride flush  3 mL Intravenous Q12H   tamsulosin  0.4 mg Oral  Daily   acetaminophen **OR** acetaminophen, albuterol, hydrALAZINE, HYDROcodone-acetaminophen  Assessment/ Plan:  James Clarke is a 81 y.o.  male with past medical conditions including Bell's palsy, diabetes, hypertension, hyperlipidemia, sleep apnea on CPAP, and chronic kidney disease stage IV.  Patient presents to the emergency department at the advice of his nephrologist for abnormal renal function.  Patient has been admitted for Bilateral flank pain [R10.9] Suprapubic abdominal pain [R10.2] Abdominal pain [R10.9] Acute renal failure superimposed on chronic kidney disease, unspecified CKD stage, unspecified acute renal failure type ([place]) [N17.9, N18.9] AKI (acute kidney injury) ([place]) [N17.9]   Acute Kidney Injury on chronic kidney disease stage IV with baseline creatinine 3.3 and GFR of 18 on 02/19/22.  Acute kidney injury secondary to 2 to 3 mm obstructive stone seen on CT abdomen pelvis.    Successful stent placement and lithotripsy by urology. Will continue  to monitor and expect creatinine to trend down in 1-2 days. Continue to avoid nephrotoxic agents ant therapies.    Lab Results  Component Value Date   CREATININE 5.91 (H) 02/28/2022   CREATININE 5.87 (H) 02/27/2022   CREATININE 5.57 (H) 02/26/2022    Intake/Output Summary (Last 24 hours) at 02/28/2022 1318 Last data filed at 02/27/2022 2300 Gross per 24 hour  Intake 480 ml  Output 1075 ml  Net -595 ml    2.  Hypertension with chronic kidney disease.  Home regimen includes amlodipine, furosemide, metoprolol, and valsartan.  Valsartan and furosemide held in place of kidney injury. Blood pressure 127/66.  3. Diabetes mellitus type II with chronic kidney disease. insulin dependent. Home regimen includes NovoLog and Levemir. Most recent hemoglobin A1c is 6.4 on 02/722.   Well controlled  4.  Chronic diastolic heart failure.  EF completed on 02/26/2022 shows EF 55 to 60% with a grade 2 diastolic dysfunction, bilateral  mildly dilated atrial's, and mildly dilated left ventricle.   LOS: 2 James Clarke 8/10/20231:18 PM

## 2022-03-01 DIAGNOSIS — I5032 Chronic diastolic (congestive) heart failure: Secondary | ICD-10-CM | POA: Diagnosis not present

## 2022-03-01 DIAGNOSIS — R1030 Lower abdominal pain, unspecified: Secondary | ICD-10-CM | POA: Diagnosis not present

## 2022-03-01 DIAGNOSIS — N179 Acute kidney failure, unspecified: Secondary | ICD-10-CM | POA: Diagnosis not present

## 2022-03-01 DIAGNOSIS — R600 Localized edema: Secondary | ICD-10-CM | POA: Diagnosis not present

## 2022-03-01 DIAGNOSIS — R238 Other skin changes: Secondary | ICD-10-CM | POA: Diagnosis present

## 2022-03-01 LAB — URINE CULTURE: Culture: NO GROWTH

## 2022-03-01 LAB — CBC
HCT: 34.1 % — ABNORMAL LOW (ref 39.0–52.0)
Hemoglobin: 10.7 g/dL — ABNORMAL LOW (ref 13.0–17.0)
MCH: 28.2 pg (ref 26.0–34.0)
MCHC: 31.4 g/dL (ref 30.0–36.0)
MCV: 90 fL (ref 80.0–100.0)
Platelets: 226 10*3/uL (ref 150–400)
RBC: 3.79 MIL/uL — ABNORMAL LOW (ref 4.22–5.81)
RDW: 13.9 % (ref 11.5–15.5)
WBC: 12.2 10*3/uL — ABNORMAL HIGH (ref 4.0–10.5)
nRBC: 0 % (ref 0.0–0.2)

## 2022-03-01 LAB — GLUCOSE, CAPILLARY
Glucose-Capillary: 195 mg/dL — ABNORMAL HIGH (ref 70–99)
Glucose-Capillary: 268 mg/dL — ABNORMAL HIGH (ref 70–99)
Glucose-Capillary: 128 mg/dL — ABNORMAL HIGH (ref 70–99)
Glucose-Capillary: 213 mg/dL — ABNORMAL HIGH (ref 70–99)

## 2022-03-01 LAB — MAGNESIUM: Magnesium: 2.2 mg/dL (ref 1.7–2.4)

## 2022-03-01 LAB — BASIC METABOLIC PANEL
Anion gap: 9 (ref 5–15)
BUN: 93 mg/dL — ABNORMAL HIGH (ref 8–23)
CO2: 20 mmol/L — ABNORMAL LOW (ref 22–32)
Calcium: 7.9 mg/dL — ABNORMAL LOW (ref 8.9–10.3)
Chloride: 110 mmol/L (ref 98–111)
Creatinine, Ser: 5.74 mg/dL — ABNORMAL HIGH (ref 0.61–1.24)
GFR, Estimated: 9 mL/min — ABNORMAL LOW (ref >60)
Glucose, Bld: 141 mg/dL — ABNORMAL HIGH (ref 70–99)
Potassium: 4.5 mmol/L (ref 3.5–5.1)
Sodium: 139 mmol/L (ref 135–145)

## 2022-03-01 MED ORDER — FLUCONAZOLE 50 MG PO TABS
150.0000 mg | ORAL_TABLET | Freq: Once | ORAL | Status: AC
  Administered 2022-03-01: 150 mg via ORAL
  Filled 2022-03-01: qty 1

## 2022-03-01 MED ORDER — NYSTATIN 100000 UNIT/GM EX POWD
Freq: Two times a day (BID) | CUTANEOUS | Status: DC
  Filled 2022-03-01: qty 15

## 2022-03-01 NOTE — Progress Notes (Signed)
Mobility Specialist - Progress Note     03/01/22 0912  Mobility  Activity Ambulated independently in hallway  Level of Assistance Standby assist, set-up cues, supervision of patient - no hands on  Assistive Device Front wheel walker  Distance Ambulated (ft) 180 ft  Activity Response Tolerated well  $Mobility charge 1 Mobility   Pt standing at sink upon entry. Pt utilizing RA. Pt ambulated one lap around NS with SBA. Pt showed signs of weakness in knees (bilateral). Pt left supine with needs in reach. Family at bedside. No complaints.   [place]ie [place] Mobility Specialist 03/01/22 9:15 AM

## 2022-03-01 NOTE — Progress Notes (Signed)
PROGRESS NOTE    James Clarke   BMW:[id number] DOB: 1940-09-07  DOA: 02/25/2022 Date of Service: 03/01/22 PCP: James Hartigan, MD     Brief Narrative / Hospital Course:  James Clarke is a 81 y.o. male with past medical history of chronic kidney disease 4, hypertension, diabetes mellitus type 2, chronic CHF and bilateral leg edema (Echo 218 2018 may shows normal left ventricular cavity size systolic function normal ejection fraction between 50 to 55%), kidney stones, obstructive sleep apnea who presented 02/25/2022 with abdominal pain evaluated in the urgent care initially and was referred to the emergency room after being told that he has acute kidney injury. 08/07: BUN of 80 creatinine of 5.61 (baseline approx 2.5), GFR of 10, WBC 13.3, UA more than 300 protein and small hemoglobin with 0-5 RBCs and 0-5 WBCs. CT Abd/Pelv: stone at the left UVJ with accompanying hydronephrosis.  Renal US: unremarkable. Admitted for ARF on CKD3 suspected prerenal d/t obstructing 2-3 mm calculi within the distal left ureter, and dehydration from decreased po intake plus home lasix and Valsartan use. DVT study given edema but more likely amlodipine effect and hypoalbuminemia.  08/08: seen by urology and nephrology. Echo done: LVEF 55-60 w/ G2 DD.  08/09: Patient underwent left ureteroscopy  08/10: Cr 5.91, UA abnormal, started Rocephin for UTI pending cultures, will repeat urine lytes to confirm FeNa --> concern for intrinsic causes.  Nephrology recs: No need for dialysis at the moment. 08/11: Minimal improvement in Cr 5.74.  No growth on urine culture, continuing current IV antibiotics. Will recollect urine.  Patient also complaining of some discharge/irritation around urethra/glans, treating for yeast infection    Consultants:  Urology Nephrology   Procedures: Echo L ureteroscopy 02/27/22    Subjective: Patient reports some discharge/irritation around urethra/glans, nonpainful. Still burning  sensation, foley in place.      ASSESSMENT & PLAN:   Principal Problem:   Acute renal failure superimposed on stage 4 chronic kidney disease (HCC) Active Problems:   Abdominal pain   Type 2 diabetes mellitus with renal manifestations (HCC)   Sleep apnea   Hypertension   Bilateral leg edema   Left ureteral calculus   Chronic diastolic CHF (congestive heart failure) (HCC)   Hydronephrosis of left kidney   UTI (urinary tract infection)   Skin irritation   Abdominal pain CT Abd/Pelv showed Obstructing 2-3 mm calculi within the distal left ureter, however, pt's abdominal pain was across lower abdomen, unlike pt's prior episode of kidney stone pains.   Abdominal pain improved some with BM. Pain control Foley in place Urology following   Acute renal failure superimposed on stage 4 chronic kidney disease (HCC) Cr 5.61 on presentation.  Baseline around 2.5. AKI due to obstruction, from obstructing 2-3 mm calculi within the distal left ureter, and/or dehydration from home lasix and Valsartan use. Nephrology and urology following  Serial BMP  Caution w/ IV fluids to avoid CHF exacerbation  Treating for UTI as above  Type 2 diabetes mellitus with renal manifestations (HCC) A1c 6.4, well controlled.   Hyperglycemia due to not resumed on home insulin Plan: resume home Levemir at 25u daily add mealtime 8u TID  Sleep apnea CPAP per home setting.  Hypertension hold home lasix and Valsartan due to AKI cont home Toprol add hydralazine 50 mg Q8h due to elevated BP  Bilateral leg edema Not currently edematous. hold home lasix 2/2 AKI  Chronic diastolic CHF (congestive heart failure) ([facility]) not in acute exacerbation hold home  lasix 2/2 AKI Echo done this admission  Hydronephrosis of left kidney Obstructing 2-3 mm calculi within the distal left ureter just proximal to the left ureterovesicular junction resulting in mild left hydronephrosis and moderate perinephric  stranding. urology L ureteroscopy 08/09  Left ureteral calculus Urology: s/p L ureteroscopy 08/09  UTI (urinary tract infection) Rocephin initiated 02/28/2022, at 1 day postop left ureteroscopy Cultures pending --> no growth Urine recollected Urology following, may be inflammatory effect?  Skin irritation Glans penis / urethral meatus Suspect mild yeast infection versus irritation due to Foley catheter, recent instrumentation Diflucan ordered  Keep groin/penis dry as able      DVT prophylaxis: heparin Code Status: FULL Family Communication: wife at bedside  Disposition Plan / TOC needs: none at this time  Barriers to discharge / significant pending items: pending urology recs / discharge appropriateness postop, still AKI/ARF, anticipate will be here at least a few more days/through the weekend (today is Friday)             Objective: Vitals:   02/28/22 1437 02/28/22 1905 03/01/22 0334 03/01/22 0808  BP: (!) 122/53 (!) 136/56 (!) 110/48 135/60  Pulse: 73 81 69 78  Resp:  '20 20 18  '$ Temp:  98 F (36.7 C) 97.8 F (36.6 C) 97.7 F (36.5 C)  TempSrc:  Oral Oral Oral  SpO2: 99% 96% 97% 97%  Weight:      Height:        Intake/Output Summary (Last 24 hours) at 03/01/2022 1340 Last data filed at 03/01/2022 1045 Gross per 24 hour  Intake 240 ml  Output 1001 ml  Net -761 ml   Filed Weights   02/27/22 0400 02/27/22 0940 02/28/22 0330  Weight: (!) 146.2 kg (!) 150.6 kg (!) 146.1 kg    Examination:  Constitutional:  VS as above General Appearance: alert, well-nourished, NAD Ears, Nose, Mouth, Throat: Normal appearance Neck: No masses, trachea midline Respiratory: Normal respiratory effort Breath sounds normal, no wheeze/rhonchi/rales Cardiovascular: S1/S2 normal, no murmur/rub/gallop auscultated No lower extremity edema Gastrointestinal: Nontender, no masses Musculoskeletal:  No clubbing/cyanosis of digits GU: Redness/irritation at glans penis .  Urethral meatus No skin breakdown in this area Difficult exam d/t edema Neurological: No cranial nerve deficit on limited exam Psychiatric: Normal judgment/insight Normal mood and affect       Scheduled Medications:   amLODipine  10 mg Oral Daily   aspirin EC  81 mg Oral Daily   fluconazole  150 mg Oral Once   heparin  5,000 Units Subcutaneous Q8H   hydrALAZINE  50 mg Oral Q8H   insulin aspart  0-15 Units Subcutaneous TID WC   insulin aspart  10 Units Subcutaneous TID WC   insulin detemir  30 Units Subcutaneous Daily   metoprolol succinate  50 mg Oral Daily   nystatin   Topical BID   polyethylene glycol  34 g Oral BID   sodium chloride flush  3 mL Intravenous Q12H   tamsulosin  0.4 mg Oral Daily    Continuous Infusions:  cefTRIAXone (ROCEPHIN)  IV 2 g (03/01/22 1237)    PRN Medications:  acetaminophen **OR** acetaminophen, albuterol, hydrALAZINE, HYDROcodone-acetaminophen  Antimicrobials:  Anti-infectives (From admission, onward)    Start     Dose/Rate Route Frequency Ordered Stop   03/01/22 1430  fluconazole (DIFLUCAN) tablet 150 mg        150 mg Oral  Once 03/01/22 1339     02/28/22 1300  cefTRIAXone (ROCEPHIN) 2 g in sodium chloride 0.9 %  100 mL IVPB        2 g 200 mL/hr over 30 Minutes Intravenous Every 24 hours 02/28/22 1208         Data Reviewed: I have personally reviewed following labs and imaging studies  CBC: Recent Labs  Lab 02/25/22 1316 02/26/22 0948 02/27/22 0410 02/28/22 0402 03/01/22 0353  WBC 13.3* 10.3 9.5 12.8* 12.2*  NEUTROABS 9.9*  --   --   --   --   HGB 11.3* 11.7* 10.0* 10.4* 10.7*  HCT 36.2* 37.0* 31.4* 32.9* 34.1*  MCV 91.4 90.2 89.5 90.1 90.0  PLT 290 280 278 299 701   Basic Metabolic Panel: Recent Labs  Lab 02/25/22 1005 02/25/22 1316 02/26/22 0948 02/27/22 0410 02/28/22 0402 03/01/22 0353  NA 135  --  138 139 138 139  K 3.9  --  4.3 4.0 4.5 4.5  CL 105  --  108 110 108 110  CO2 23  --  20* 21* 21* 20*   GLUCOSE 118*  --  296* 230* 226* 141*  BUN 80*  --  67* 83* 89* 93*  CREATININE 5.61*  --  5.57* 5.87* 5.91* 5.74*  CALCIUM 8.4*  --  8.4* 8.0* 8.0* 7.9*  MG  --  2.2  --  2.3 2.3 2.2  PHOS  --  5.4*  --   --   --   --    GFR: Estimated Creatinine Clearance: 14.6 mL/min (A) (by C-G formula based on SCr of 5.74 mg/dL (H)). Liver Function Tests: Recent Labs  Lab 02/25/22 1005 02/26/22 0948  AST 19 21  ALT 14 15  ALKPHOS 71 69  BILITOT 0.5 0.7  PROT 7.2 7.0  ALBUMIN 2.8* 2.7*   No results for input(s): "LIPASE", "AMYLASE" in the last 168 hours. No results for input(s): "AMMONIA" in the last 168 hours. Coagulation Profile: No results for input(s): "INR", "PROTIME" in the last 168 hours. Cardiac Enzymes: No results for input(s): "CKTOTAL", "CKMB", "CKMBINDEX", "TROPONINI" in the last 168 hours. BNP (last 3 results) No results for input(s): "PROBNP" in the last 8760 hours. HbA1C: No results for input(s): "HGBA1C" in the last 72 hours.  CBG: Recent Labs  Lab 02/28/22 1131 02/28/22 1632 02/28/22 2056 03/01/22 0803 03/01/22 1156  GLUCAP 263* 205* 187* 195* 268*   Lipid Profile: No results for input(s): "CHOL", "HDL", "LDLCALC", "TRIG", "CHOLHDL", "LDLDIRECT" in the last 72 hours. Thyroid Function Tests: No results for input(s): "TSH", "T4TOTAL", "FREET4", "T3FREE", "THYROIDAB" in the last 72 hours.  Anemia Panel: No results for input(s): "VITAMINB12", "FOLATE", "FERRITIN", "TIBC", "IRON", "RETICCTPCT" in the last 72 hours. Urine analysis:    Component Value Date/Time   COLORURINE YELLOW (A) 02/28/2022 0958   APPEARANCEUR CLOUDY (A) 02/28/2022 0958   APPEARANCEUR Hazy (A) 08/19/2019 1113   LABSPEC 1.013 02/28/2022 0958   LABSPEC 1.008 11/27/2013 0810   PHURINE 5.0 02/28/2022 0958   GLUCOSEU >=500 (A) 02/28/2022 0958   GLUCOSEU Negative 11/27/2013 0810   HGBUR LARGE (A) 02/28/2022 0958   BILIRUBINUR NEGATIVE 02/28/2022 0958   BILIRUBINUR Negative 08/19/2019 1113    BILIRUBINUR Negative 11/27/2013 0810   KETONESUR 5 (A) 02/28/2022 0958   PROTEINUR >=300 (A) 02/28/2022 0958   NITRITE NEGATIVE 02/28/2022 0958   LEUKOCYTESUR MODERATE (A) 02/28/2022 0958   LEUKOCYTESUR Negative 11/27/2013 0810   Sepsis Labs: '@LABRCNTIP'$ (procalcitonin:4,lacticidven:4)  Recent Results (from the past 240 hour(s))  Urine Culture     Status: None   Collection Time: 02/28/22 12:50 PM   Specimen: Urine, Catheterized  Result Value Ref Range Status   Specimen Description   Final    URINE, CATHETERIZED Performed at [facility], [address], [place], [place] [address]    Special Requests   Final    NONE Performed at [facility], [address], [place], [place] [address]    Culture   Final    NO GROWTH Performed at [facility], [place] [address], [place], [place] [address]    Report Status 03/01/2022 FINAL  Final         Radiology Studies last 96 hours: DG OR UROLOGY CYSTO IMAGE ([facility] ONLY)  Result Date: 02/27/2022 There is no interpretation for this exam.  This order is for images obtained during a surgical procedure.  Please See "Surgeries" Tab for more information regarding the procedure.   ECHOCARDIOGRAM COMPLETE  Result Date: 02/26/2022    ECHOCARDIOGRAM REPORT   Patient Name:   LIONEL GENE Battey Date of Exam: 02/26/2022 Medical Rec #:  [id number]       Height:       70.0 in Accession #:    [id number]      Weight:       328.5 lb Date of Birth:  [birth year]      BSA:          2.577 m Patient Age:    64 years        BP:           156/72 mmHg Patient Gender: M               HR:           77 bpm. Exam Location:  [facility] Procedure: 2D Echo, Color Doppler, Cardiac Doppler and Intracardiac            Opacification Agent Indications:     CHF-acute systolic V76.16  History:         Patient has prior history of Echocardiogram examinations, most                  recent 11/26/2016. Signs/Symptoms:Dyspnea; Risk Factors:Diabetes,                  Hypertension  and Sleep Apnea.  Sonographer:     Sherrie Sport Referring Phys:  [place] Diagnosing Phys: Yolonda Kida MD  Sonographer Comments: Technically challenging study due to limited acoustic windows, suboptimal apical window and no subcostal window. IMPRESSIONS  1. Left ventricular ejection fraction, by estimation, is 55 to 60%. The left ventricle has normal function. The left ventricle has no regional wall motion abnormalities. The left ventricular internal cavity size was mildly dilated. Left ventricular diastolic parameters are consistent with Grade II diastolic dysfunction (pseudonormalization).  2. Right ventricular systolic function is normal. The right ventricular size is normal.  3. Left atrial size was mildly dilated.  4. Right atrial size was mildly dilated.  5. The mitral valve is normal in structure. Trivial mitral valve regurgitation.  6. The aortic valve is grossly normal. Aortic valve regurgitation is not visualized. FINDINGS  Left Ventricle: Left ventricular ejection fraction, by estimation, is 55 to 60%. The left ventricle has normal function. The left ventricle has no regional wall motion abnormalities. Definity contrast agent was given IV to delineate the left ventricular  endocardial borders. The left ventricular internal cavity size was mildly dilated. There is no left ventricular hypertrophy. Left ventricular diastolic parameters are consistent with Grade II diastolic dysfunction (pseudonormalization). Right Ventricle: The right  ventricular size is normal. No increase in right ventricular wall thickness. Right ventricular systolic function is normal. Left Atrium: Left atrial size was mildly dilated. Right Atrium: Right atrial size was mildly dilated. Pericardium: There is no evidence of pericardial effusion. Mitral Valve: The mitral valve is normal in structure. Trivial mitral valve regurgitation. Tricuspid Valve: The tricuspid valve is normal in structure. Tricuspid valve regurgitation  is trivial. Aortic Valve: The aortic valve is grossly normal. Aortic valve regurgitation is not visualized. Aortic valve mean gradient measures 6.0 mmHg. Aortic valve peak gradient measures 9.1 mmHg. Aortic valve area, by VTI measures 2.56 cm. Pulmonic Valve: The pulmonic valve was normal in structure. Pulmonic valve regurgitation is not visualized. Aorta: The ascending aorta was not well visualized. IAS/Shunts: No atrial level shunt detected by color flow Doppler.  LEFT VENTRICLE PLAX 2D LVIDd:         5.10 cm   Diastology LVIDs:         3.60 cm   LV e' medial:    2.94 cm/s LV PW:         1.50 cm   LV E/e' medial:  27.8 LV IVS:        1.50 cm   LV e' lateral:   6.64 cm/s LVOT diam:     2.00 cm   LV E/e' lateral: 12.3 LV SV:         65 LV SV Index:   25 LVOT Area:     3.14 cm  RIGHT VENTRICLE RV Basal diam:  4.50 cm RV S prime:     12.30 cm/s TAPSE (M-mode): 3.4 cm LEFT ATRIUM              Index        RIGHT ATRIUM           Index LA diam:        4.10 cm  1.59 cm/m   RA Area:     33.70 cm LA Vol (A2C):   114.0 ml 44.23 ml/m  RA Volume:   123.00 ml 47.72 ml/m LA Vol (A4C):   123.0 ml 47.72 ml/m LA Biplane Vol: 119.0 ml 46.17 ml/m  AORTIC VALVE AV Area (Vmax):    2.10 cm AV Area (Vmean):   1.98 cm AV Area (VTI):     2.56 cm AV Vmax:           151.00 cm/s AV Vmean:          110.000 cm/s AV VTI:            0.253 m AV Peak Grad:      9.1 mmHg AV Mean Grad:      6.0 mmHg LVOT Vmax:         101.00 cm/s LVOT Vmean:        69.200 cm/s LVOT VTI:          0.206 m LVOT/AV VTI ratio: 0.81  AORTA Ao Root diam: 4.07 cm MITRAL VALVE               TRICUSPID VALVE MV Area (PHT): 4.57 cm    TR Peak grad:   7.6 mmHg MV Decel Time: 166 msec    TR Vmax:        138.00 cm/s MV E velocity: 81.80 cm/s MV A velocity: 53.60 cm/s  SHUNTS MV E/A ratio:  1.53        Systemic VTI:  0.21 m  Systemic Diam: 2.00 cm Yolonda Kida MD Electronically signed by Yolonda Kida MD Signature Date/Time:  02/26/2022/1:51:02 PM    Final    CT ABDOMEN PELVIS WO CONTRAST  Result Date: 02/26/2022 CLINICAL DATA:  Abdominal pain, acute, nonlocalized abd pain/ flank pain/acute kidney injury. EXAM: CT ABDOMEN AND PELVIS WITHOUT CONTRAST TECHNIQUE: Multidetector CT imaging of the abdomen and pelvis was performed following the standard protocol without IV contrast. RADIATION DOSE REDUCTION: This exam was performed according to the departmental dose-optimization program which includes automated exposure control, adjustment of the mA and/or kV according to patient size and/or use of iterative reconstruction technique. COMPARISON:  07/26/2019 FINDINGS: Lower chest: Mildly progressive subpleural bibasilar pulmonary fibrotic change. Extensive coronary artery calcification. Global cardiac size within normal limits. Stable trace pericardial effusion. Hepatobiliary: No focal liver abnormality is seen. Status post cholecystectomy. No biliary dilatation. Pancreas: Unremarkable Spleen: Unremarkable Adrenals/Urinary Tract: The adrenal glands are unremarkable. The kidneys are normal in size and position. There is mild left hydronephrosis and moderate perinephric stranding secondary to 2 obstructing calculi measuring 2-3 mm in size within the distal left ureter just proximal to the left ureterovesicular junction. There is superimposed bilateral nonobstructing nephrolithiasis with calculi measuring up to 10 mm in greatest dimension bilaterally. No ureteral calculi on the right. No hydronephrosis on the right. Simple exophytic cortical cyst arises from the upper pole of the right kidney. No follow-up imaging is recommended for this lesion. The bladder is unremarkable. Stomach/Bowel: Moderate sigmoid diverticulosis without superimposed acute inflammatory change. Stomach, small bowel, and large bowel are otherwise unremarkable. Appendix absent. No free intraperitoneal gas or fluid. Vascular/Lymphatic: Mild aortoiliac atherosclerotic  calcification. No aortic aneurysm. No pathologic adenopathy within the abdomen and pelvis. Reproductive: Mild prostatic hypertrophy. Other: Small fat containing umbilical and bilateral inguinal hernias. Rectum unremarkable. Musculoskeletal: No acute bone abnormality. No lytic or blastic bone lesion. IMPRESSION: 1. Obstructing 2-3 mm calculi within the distal left ureter just proximal to the left ureterovesicular junction resulting in mild left hydronephrosis and moderate perinephric stranding. 2. Superimposed moderate bilateral nonobstructing nephrolithiasis. 3. Moderate sigmoid diverticulosis without superimposed acute inflammatory change. 4. Extensive coronary artery calcification. 5. Mildly progressive bibasilar pulmonary fibrosis. Aortic Atherosclerosis (ICD10-I70.0). Electronically Signed   By: Fidela Salisbury M.D.   On: 02/26/2022 02:34   US Renal  Result Date: 02/25/2022 CLINICAL DATA:  Worsening renal function. EXAM: RENAL / URINARY TRACT ULTRASOUND COMPLETE COMPARISON:  None Available. FINDINGS: Right Kidney: Renal measurements: 12.1 x 5.7 x 4.9 cm = volume: 175 mL. Mild renal cortical thinning and increased echogenicity. No hydronephrosis. Multiple cysts within the right kidney measuring up to 4.2 cm. Left Kidney: Renal measurements: 12.3 x 6.4 x 5.5 cm = volume: 225 mL. Renal cortical thinning. No hydronephrosis. No renal mass. Bladder: Appears normal for degree of bladder distention. Other: None. IMPRESSION: No hydronephrosis. Mild renal cortical thinning bilaterally, raising the possibility of chronic medical renal disease. Electronically Signed   By: Lovey Newcomer M.D.   On: 02/25/2022 15:21            LOS: 3 days     Emeterio Reeve, DO [facility] 03/01/2022, 1:40 PM   Staff may message me via secure chat in [place]  but this may not receive immediate response,  please page for urgent matters!  If 7PM-7AM, please contact night-coverage [email or web address]  Dictation software  was used to generate the above note. Typos may occur and escape review, as with typed/written notes. Please contact Dr Sheppard Coil directly for clarity if needed.

## 2022-03-01 NOTE — Plan of Care (Signed)
  Problem: Education: Goal: Ability to describe self-care measures that may prevent or decrease complications (Diabetes Survival Skills Education) will improve Outcome: Progressing Goal: Individualized Educational Video(s) Outcome: Progressing   Problem: Coping: Goal: Ability to adjust to condition or change in health will improve Outcome: Progressing   Problem: Fluid Volume: Goal: Ability to maintain a balanced intake and output will improve Outcome: Progressing   Problem: Health Behavior/Discharge Planning: Goal: Ability to identify and utilize available resources and services will improve Outcome: Progressing Goal: Ability to manage health-related needs will improve Outcome: Progressing   Problem: Nutritional: Goal: Maintenance of adequate nutrition will improve Outcome: Progressing Goal: Progress toward achieving an optimal weight will improve Outcome: Progressing   Problem: Skin Integrity: Goal: Risk for impaired skin integrity will decrease Outcome: Progressing   Problem: Tissue Perfusion: Goal: Adequacy of tissue perfusion will improve Outcome: Progressing

## 2022-03-01 NOTE — Assessment & Plan Note (Signed)
Glans penis / urethral meatus Suspect mild yeast infection versus irritation due to Foley catheter, recent instrumentation  Treated with Diflucan and improved  Keep groin/penis dry as able

## 2022-03-01 NOTE — Progress Notes (Signed)
[facility]  ROUNDING NOTE   Subjective:   James Clarke is a 81 year old male with past medical conditions including Bell's palsy, diabetes, hypertension, hyperlipidemia, sleep apnea on CPAP, and chronic kidney disease stage IV.  Patient presents to the emergency department at the advice of his nephrologist for abnormal renal function.  Patient has been admitted for Bilateral flank pain [R10.9] Suprapubic abdominal pain [R10.2] Abdominal pain [R10.9] Acute renal failure superimposed on chronic kidney disease, unspecified CKD stage, unspecified acute renal failure type ([place]) [N17.9, N18.9] AKI (acute kidney injury) ([place]) [N17.9]  Patient is known to our practice and receives follow-up care by Dr. Holley Raring.   Patient resting in bed, wife at bedside States he was able to ambulate in hallway Appetite appropriate Denies nausea and vomiting  Creatinine 5.74 UOP 2L  Objective:  Vital signs in last 24 hours:  Temp:  [97.7 F (36.5 C)-98 F (36.7 C)] 97.7 F (36.5 C) (08/11 0808) Pulse Rate:  [69-81] 78 (08/11 0808) Resp:  [18-20] 18 (08/11 0808) BP: (110-136)/(48-60) 135/60 (08/11 0808) SpO2:  [96 %-99 %] 97 % (08/11 0808)  Weight change:  Filed Weights   02/27/22 0400 02/27/22 0940 02/28/22 0330  Weight: (!) 146.2 kg (!) 150.6 kg (!) 146.1 kg    Intake/Output: I/O last 3 completed shifts: In: -  Out: 2700 [Urine:2700]   Intake/Output this shift:  Total I/O In: 240 [P.O.:240] Out: 1 [Urine:1]  Physical Exam: General: NAD  Head: Normocephalic, atraumatic. Moist oral mucosal membranes  Eyes: Anicteric  Lungs:  Clear to auscultation, normal effort, room air  Heart: Regular rate and rhythm  Abdomen:  Soft, nontender, obese  Extremities: Trace peripheral edema.  Neurologic: Nonfocal, moving all four extremities  Skin: No lesions  Access: None    Basic Metabolic Panel: Recent Labs  Lab 02/25/22 1005 02/25/22 1316 02/26/22 0948 02/27/22 0410  02/28/22 0402 03/01/22 0353  NA 135  --  138 139 138 139  K 3.9  --  4.3 4.0 4.5 4.5  CL 105  --  108 110 108 110  CO2 23  --  20* 21* 21* 20*  GLUCOSE 118*  --  296* 230* 226* 141*  BUN 80*  --  67* 83* 89* 93*  CREATININE 5.61*  --  5.57* 5.87* 5.91* 5.74*  CALCIUM 8.4*  --  8.4* 8.0* 8.0* 7.9*  MG  --  2.2  --  2.3 2.3 2.2  PHOS  --  5.4*  --   --   --   --      Liver Function Tests: Recent Labs  Lab 02/25/22 1005 02/26/22 0948  AST 19 21  ALT 14 15  ALKPHOS 71 69  BILITOT 0.5 0.7  PROT 7.2 7.0  ALBUMIN 2.8* 2.7*    No results for input(s): "LIPASE", "AMYLASE" in the last 168 hours. No results for input(s): "AMMONIA" in the last 168 hours.  CBC: Recent Labs  Lab 02/25/22 1316 02/26/22 0948 02/27/22 0410 02/28/22 0402 03/01/22 0353  WBC 13.3* 10.3 9.5 12.8* 12.2*  NEUTROABS 9.9*  --   --   --   --   HGB 11.3* 11.7* 10.0* 10.4* 10.7*  HCT 36.2* 37.0* 31.4* 32.9* 34.1*  MCV 91.4 90.2 89.5 90.1 90.0  PLT 290 280 278 299 226     Cardiac Enzymes: No results for input(s): "CKTOTAL", "CKMB", "CKMBINDEX", "TROPONINI" in the last 168 hours.  BNP: Invalid input(s): "POCBNP"  CBG: Recent Labs  Lab 02/28/22 1131 02/28/22 1632 02/28/22 2056  03/01/22 0803 03/01/22 1156  GLUCAP 263* 205* 187* 195* 42*     Microbiology: Results for orders placed or performed during the hospital encounter of 02/25/22  Urine Culture     Status: None   Collection Time: 02/28/22 12:50 PM   Specimen: Urine, Catheterized  Result Value Ref Range Status   Specimen Description   Final    URINE, CATHETERIZED Performed at [facility], [address], [place], [place] [address]    Special Requests   Final    NONE Performed at [facility], [address], [place], [place] [address]    Culture   Final    NO GROWTH Performed at [facility], [place] [address], [place],  [address]    Report Status 03/01/2022 FINAL  Final    Coagulation  Studies: No results for input(s): "LABPROT", "INR" in the last 72 hours.  Urinalysis: Recent Labs    02/28/22 0958  COLORURINE YELLOW*  LABSPEC 1.013  PHURINE 5.0  GLUCOSEU >=500*  HGBUR LARGE*  BILIRUBINUR NEGATIVE  KETONESUR 5*  PROTEINUR >=300*  NITRITE NEGATIVE  LEUKOCYTESUR MODERATE*       Imaging: No results found.   Medications:    cefTRIAXone (ROCEPHIN)  IV 2 g (03/01/22 1237)    amLODipine  10 mg Oral Daily   aspirin EC  81 mg Oral Daily   heparin  5,000 Units Subcutaneous Q8H   hydrALAZINE  50 mg Oral Q8H   insulin aspart  0-15 Units Subcutaneous TID WC   insulin aspart  10 Units Subcutaneous TID WC   insulin detemir  30 Units Subcutaneous Daily   metoprolol succinate  50 mg Oral Daily   polyethylene glycol  34 g Oral BID   sodium chloride flush  3 mL Intravenous Q12H   tamsulosin  0.4 mg Oral Daily   acetaminophen **OR** acetaminophen, albuterol, hydrALAZINE, HYDROcodone-acetaminophen  Assessment/ Plan:  [name] is a 81 y.o.  male with past medical conditions including Bell's palsy, diabetes, hypertension, hyperlipidemia, sleep apnea on CPAP, and chronic kidney disease stage IV.  Patient presents to the emergency department at the advice of his nephrologist for abnormal renal function.  Patient has been admitted for Bilateral flank pain [R10.9] Suprapubic abdominal pain [R10.2] Abdominal pain [R10.9] Acute renal failure superimposed on chronic kidney disease, unspecified CKD stage, unspecified acute renal failure type ([place]) [N17.9, N18.9] AKI (acute kidney injury) ([place]) [N17.9]   Acute Kidney Injury on chronic kidney disease stage IV with baseline creatinine 3.3 and GFR of 18 on 02/19/22.  Acute kidney injury secondary to 2 to 3 mm obstructive stone seen on CT abdomen pelvis.    Renal function stable today. Adequate urine output recorded. We expect renal recovery with obstruction resolution. Will monitor creatinine and urine output.     Lab Results  Component Value Date   CREATININE 5.74 (H) 03/01/2022   CREATININE 5.91 (H) 02/28/2022   CREATININE 5.87 (H) 02/27/2022    Intake/Output Summary (Last 24 hours) at 03/01/2022 1330 Last data filed at 03/01/2022 1045 Gross per 24 hour  Intake 240 ml  Output 1001 ml  Net -761 ml    2.  Hypertension with chronic kidney disease.  Home regimen includes amlodipine, furosemide, metoprolol, and valsartan.  Valsartan and furosemide held in place of kidney injury. Blood pressure 135/60  3. Diabetes mellitus type II with chronic kidney disease. insulin dependent. Home regimen includes NovoLog and Levemir. Most recent hemoglobin A1c is 6.4 on 02/722.  Glucose elevated at times.   4.  Chronic diastolic heart failure.  EF completed on 02/26/2022 shows EF 55 to 60% with a grade 2 diastolic dysfunction, bilateral mildly dilated atrial's, and mildly dilated left ventricle.   LOS: 3 James Clarke 8/11/20231:30 PM

## 2022-03-02 DIAGNOSIS — N179 Acute kidney failure, unspecified: Secondary | ICD-10-CM | POA: Diagnosis not present

## 2022-03-02 DIAGNOSIS — R1030 Lower abdominal pain, unspecified: Secondary | ICD-10-CM | POA: Diagnosis not present

## 2022-03-02 DIAGNOSIS — R600 Localized edema: Secondary | ICD-10-CM | POA: Diagnosis not present

## 2022-03-02 DIAGNOSIS — I5032 Chronic diastolic (congestive) heart failure: Secondary | ICD-10-CM | POA: Diagnosis not present

## 2022-03-02 LAB — BASIC METABOLIC PANEL
Anion gap: 9 (ref 5–15)
BUN: 93 mg/dL — ABNORMAL HIGH (ref 8–23)
CO2: 20 mmol/L — ABNORMAL LOW (ref 22–32)
Calcium: 7.9 mg/dL — ABNORMAL LOW (ref 8.9–10.3)
Chloride: 110 mmol/L (ref 98–111)
Creatinine, Ser: 5.5 mg/dL — ABNORMAL HIGH (ref 0.61–1.24)
GFR, Estimated: 10 mL/min — ABNORMAL LOW (ref >60)
Glucose, Bld: 199 mg/dL — ABNORMAL HIGH (ref 70–99)
Potassium: 4.2 mmol/L (ref 3.5–5.1)
Sodium: 139 mmol/L (ref 135–145)

## 2022-03-02 LAB — GLUCOSE, CAPILLARY
Glucose-Capillary: 193 mg/dL — ABNORMAL HIGH (ref 70–99)
Glucose-Capillary: 304 mg/dL — ABNORMAL HIGH (ref 70–99)
Glucose-Capillary: 140 mg/dL — ABNORMAL HIGH (ref 70–99)
Glucose-Capillary: 119 mg/dL — ABNORMAL HIGH (ref 70–99)

## 2022-03-02 LAB — MAGNESIUM: Magnesium: 2.2 mg/dL (ref 1.7–2.4)

## 2022-03-02 NOTE — Progress Notes (Signed)
[facility]  PROGRESS NOTE   Subjective:   Bedside.  Foley draining clear urine.  Objective:  Vital signs: Blood pressure (!) 167/67, pulse 84, temperature 97.9 F (36.6 C), temperature source Oral, resp. rate 18, height '5\' 10"'$  (1.778 m), weight (!) 145.6 kg, SpO2 99 %.  Intake/Output Summary (Last 24 hours) at 03/02/2022 1434 Last data filed at 03/02/2022 1238 Gross per 24 hour  Intake 443 ml  Output 2100 ml  Net -1657 ml   Filed Weights   02/27/22 0940 02/28/22 0330 03/02/22 0500  Weight: (!) 150.6 kg (!) 146.1 kg (!) 145.6 kg     Physical Exam: General:  No acute distress  Head:  Normocephalic, atraumatic. Moist oral mucosal membranes  Eyes:  Anicteric  Neck:  Supple  Lungs:   Clear to auscultation, normal effort  Heart:  S1S2 no rubs  Abdomen:   Soft, nontender, bowel sounds present  Extremities:  peripheral edema.  Neurologic:  Awake, alert, following commands  Skin:  No lesions  Access:     Basic Metabolic Panel: Recent Labs  Lab 02/25/22 1316 02/26/22 0948 02/27/22 0410 02/28/22 0402 03/01/22 0353 03/02/22 0600  NA  --  138 139 138 139 139  K  --  4.3 4.0 4.5 4.5 4.2  CL  --  108 110 108 110 110  CO2  --  20* 21* 21* 20* 20*  GLUCOSE  --  296* 230* 226* 141* 199*  BUN  --  67* 83* 89* 93* 93*  CREATININE  --  5.57* 5.87* 5.91* 5.74* 5.50*  CALCIUM  --  8.4* 8.0* 8.0* 7.9* 7.9*  MG 2.2  --  2.3 2.3 2.2 2.2  PHOS 5.4*  --   --   --   --   --     CBC: Recent Labs  Lab 02/25/22 1316 02/26/22 0948 02/27/22 0410 02/28/22 0402 03/01/22 0353  WBC 13.3* 10.3 9.5 12.8* 12.2*  NEUTROABS 9.9*  --   --   --   --   HGB 11.3* 11.7* 10.0* 10.4* 10.7*  HCT 36.2* 37.0* 31.4* 32.9* 34.1*  MCV 91.4 90.2 89.5 90.1 90.0  PLT 290 280 278 299 226     Urinalysis: Recent Labs    02/28/22 0958  COLORURINE YELLOW*  LABSPEC 1.013  PHURINE 5.0  GLUCOSEU >=500*  HGBUR LARGE*  BILIRUBINUR NEGATIVE  KETONESUR 5*  PROTEINUR >=300*  NITRITE  NEGATIVE  LEUKOCYTESUR MODERATE*      Imaging: No results found.   Medications:    cefTRIAXone (ROCEPHIN)  IV 2 g (03/02/22 1224)    amLODipine  10 mg Oral Daily   aspirin EC  81 mg Oral Daily   heparin  5,000 Units Subcutaneous Q8H   hydrALAZINE  50 mg Oral Q8H   insulin aspart  0-15 Units Subcutaneous TID WC   insulin aspart  10 Units Subcutaneous TID WC   insulin detemir  30 Units Subcutaneous Daily   metoprolol succinate  50 mg Oral Daily   nystatin   Topical BID   polyethylene glycol  34 g Oral BID   sodium chloride flush  3 mL Intravenous Q12H   tamsulosin  0.4 mg Oral Daily    Assessment/ Plan:     Principal Problem:   Acute renal failure superimposed on stage 4 chronic kidney disease (HCC) Active Problems:   Type 2 diabetes mellitus with renal manifestations (HCC)   Sleep apnea   Hypertension   Bilateral leg edema   Left ureteral calculus  Abdominal pain   Chronic diastolic CHF (congestive heart failure) (HCC)   Hydronephrosis of left kidney   UTI (urinary tract infection)   Skin irritation  [name] is a 81 y.o.  male with past medical conditions including Bell's palsy, diabetes, hypertension, hyperlipidemia, sleep apnea on CPAP, and chronic kidney disease stage IV.  Patient presents to the emergency department at the advice of his nephrologist for abnormal renal function.  Patient has been admitted for Bilateral flank pain [R10.9] Suprapubic abdominal pain [R10.2] Abdominal pain [R10.9] Acute renal failure superimposed on chronic kidney disease, unspecified CKD stage, unspecified acute renal failure type ([place]) [N17.9, N18.9] AKI (acute kidney injury) ([place]) [N17.9]  #1: Acute kidney injury on CKD stage IV: Patient has a baseline creatinine of 3.3.  Patient is now at admitted with obstructive uropathy secondary to nephrolithiasis.  Patient is status post ureteroscopy. Renal indices are slowly but steadily improving.  We will continue to monitor  urine output closely.  #2: Hypertension: Continue present antihypertensive treatment.  Diuretics on hold at this time.  #3: Diabetes: Monitor blood glucose closely.  #4: Anemia: Anemia secondary to chronic kidney disease we will continue to monitor closely.  #5: Congestive heart failure: We will continue 2 g salt restricted diet with 1 L fluid restriction orally.  Spoke to the patient and his wife at bedside in detail and answered all their questions to their satisfaction.    LOS: [place], MD [facility] 8/12/20232:34 PM

## 2022-03-02 NOTE — Progress Notes (Signed)
PROGRESS NOTE    James Clarke   TKW:[id number] DOB: Feb 17, 1941  DOA: 02/25/2022 Date of Service: 03/02/22 PCP: Sofie Hartigan, MD     Brief Narrative / Hospital Course:  James Clarke is a 81 y.o. male with past medical history of chronic kidney disease 4, hypertension, diabetes mellitus type 2, chronic CHF and bilateral leg edema (Echo 218 2018 may shows normal left ventricular cavity size systolic function normal ejection fraction between 50 to 55%), kidney stones, obstructive sleep apnea who presented 02/25/2022 with abdominal pain evaluated in the urgent care initially and was referred to the emergency room after being told that he has acute kidney injury. 08/07: BUN of 80 creatinine of 5.61 (baseline approx 2.5), GFR of 10, WBC 13.3, UA more than 300 protein and small hemoglobin with 0-5 RBCs and 0-5 WBCs. CT Abd/Pelv: stone at the left UVJ with accompanying hydronephrosis.  Renal US: unremarkable. Admitted for ARF on CKD3 suspected prerenal d/t obstructing 2-3 mm calculi within the distal left ureter, and dehydration from decreased po intake plus home lasix and Valsartan use. DVT study given edema but more likely amlodipine effect and hypoalbuminemia.  08/08: seen by urology and nephrology. Echo done: LVEF 55-60 w/ G2 DD.  08/09: Patient underwent left ureteroscopy  08/10: Cr 5.91, UA abnormal, started Rocephin for UTI pending cultures, will repeat urine lytes to confirm FeNa --> concern for intrinsic causes.  Nephrology recs: No need for dialysis at the moment. 08/11: Minimal improvement in Cr 5.74.  No growth on urine culture, continuing current IV antibiotics. Will recollect urine.  Patient also complaining of some discharge/irritation around urethra/glans, treating for yeast infection 08/12: Cr 5.5.     Consultants:  Urology Nephrology   Procedures: Echo L ureteroscopy 02/27/22    Subjective: Patient reports some discharge/irritation around urethra/glans,  nonpainful. Still burning sensation, foley in place.      ASSESSMENT & PLAN:   Principal Problem:   Acute renal failure superimposed on stage 4 chronic kidney disease (HCC) Active Problems:   Abdominal pain   Type 2 diabetes mellitus with renal manifestations (HCC)   Sleep apnea   Hypertension   Bilateral leg edema   Left ureteral calculus   Chronic diastolic CHF (congestive heart failure) (HCC)   Hydronephrosis of left kidney   UTI (urinary tract infection)   Skin irritation   Abdominal pain CT Abd/Pelv showed Obstructing 2-3 mm calculi within the distal left ureter, however, pt's abdominal pain was across lower abdomen, unlike pt's prior episode of kidney stone pains.   Abdominal pain improved some with BM. Pain control Foley in place Urology following   Acute renal failure superimposed on stage 4 chronic kidney disease (HCC) Cr 5.61 on presentation.  Baseline around 2.5. AKI due to obstruction, from obstructing 2-3 mm calculi within the distal left ureter, and/or dehydration from home lasix and Valsartan use. Nephrology and urology following  Serial BMP  Caution w/ IV fluids to avoid CHF exacerbation  Treating for UTI as above  Type 2 diabetes mellitus with renal manifestations (HCC) A1c 6.4, well controlled.   Hyperglycemia due to not resumed on home insulin Plan: resume home Levemir at 25u daily add mealtime 8u TID  Sleep apnea CPAP per home setting.  Hypertension hold home lasix and Valsartan due to AKI cont home Toprol add hydralazine 50 mg Q8h due to elevated BP  Bilateral leg edema Not currently edematous. hold home lasix 2/2 AKI  Chronic diastolic CHF (congestive heart failure) ([place]) not in  acute exacerbation hold home lasix 2/2 AKI Echo done this admission  Hydronephrosis of left kidney Obstructing 2-3 mm calculi within the distal left ureter just proximal to the left ureterovesicular junction resulting in mild left hydronephrosis and moderate  perinephric stranding. urology L ureteroscopy 08/09  Left ureteral calculus Urology: s/p L ureteroscopy 08/09  UTI (urinary tract infection) Rocephin initiated 02/28/2022, at 1 day postop left ureteroscopy Cultures pending --> no growth Urine recollected Urology following, may be inflammatory effect?  Skin irritation Glans penis / urethral meatus Suspect mild yeast infection versus irritation due to Foley catheter, recent instrumentation Diflucan ordered  Keep groin/penis dry as able      DVT prophylaxis: heparin Code Status: FULL Family Communication: Wife at bedside on rounds Disposition Plan / TOC needs: none at this time  Barriers to discharge / significant pending items: pending urology recs / discharge appropriateness postop, still AKI/ARF, anticipate will be here at least a few more days/through the weekend (today is Saturday)             Objective: Vitals:   03/01/22 1909 03/02/22 0247 03/02/22 0500 03/02/22 0817  BP: (!) 145/60 (!) 123/56  (!) 167/67  Pulse: 82 78  84  Resp: '20 18  18  '$ Temp: 97.8 F (36.6 C) 97.7 F (36.5 C)  97.9 F (36.6 C)  TempSrc: Oral Oral  Oral  SpO2: 94% 93%  99%  Weight:   (!) 145.6 kg   Height:        Intake/Output Summary (Last 24 hours) at 03/02/2022 1318 Last data filed at 03/02/2022 1238 Gross per 24 hour  Intake 443 ml  Output 2100 ml  Net -1657 ml   Filed Weights   02/27/22 0940 02/28/22 0330 03/02/22 0500  Weight: (!) 150.6 kg (!) 146.1 kg (!) 145.6 kg    Examination:  Constitutional:  VS as above General Appearance: alert, well-nourished, NAD Ears, Nose, Mouth, Throat: Normal appearance Neck: No masses, trachea midline Respiratory: Normal respiratory effort Breath sounds normal, no wheeze/rhonchi/rales Cardiovascular: S1/S2 normal, no murmur/rub/gallop auscultated No lower extremity edema Gastrointestinal: Nontender, no masses Musculoskeletal:  No clubbing/cyanosis of digits Neurological: No  cranial nerve deficit on limited exam Psychiatric: Normal judgment/insight Normal mood and affect       Scheduled Medications:   amLODipine  10 mg Oral Daily   aspirin EC  81 mg Oral Daily   heparin  5,000 Units Subcutaneous Q8H   hydrALAZINE  50 mg Oral Q8H   insulin aspart  0-15 Units Subcutaneous TID WC   insulin aspart  10 Units Subcutaneous TID WC   insulin detemir  30 Units Subcutaneous Daily   metoprolol succinate  50 mg Oral Daily   nystatin   Topical BID   polyethylene glycol  34 g Oral BID   sodium chloride flush  3 mL Intravenous Q12H   tamsulosin  0.4 mg Oral Daily    Continuous Infusions:  cefTRIAXone (ROCEPHIN)  IV 2 g (03/02/22 1224)    PRN Medications:  acetaminophen **OR** acetaminophen, albuterol, hydrALAZINE, HYDROcodone-acetaminophen  Antimicrobials:  Anti-infectives (From admission, onward)    Start     Dose/Rate Route Frequency Ordered Stop   03/01/22 1430  fluconazole (DIFLUCAN) tablet 150 mg        150 mg Oral  Once 03/01/22 1339 03/01/22 1604   02/28/22 1300  cefTRIAXone (ROCEPHIN) 2 g in sodium chloride 0.9 % 100 mL IVPB        2 g 200 mL/hr over 30 Minutes Intravenous Every  24 hours 02/28/22 1208         Data Reviewed: I have personally reviewed following labs and imaging studies  CBC: Recent Labs  Lab 02/25/22 1316 02/26/22 0948 02/27/22 0410 02/28/22 0402 03/01/22 0353  WBC 13.3* 10.3 9.5 12.8* 12.2*  NEUTROABS 9.9*  --   --   --   --   HGB 11.3* 11.7* 10.0* 10.4* 10.7*  HCT 36.2* 37.0* 31.4* 32.9* 34.1*  MCV 91.4 90.2 89.5 90.1 90.0  PLT 290 280 278 299 161   Basic Metabolic Panel: Recent Labs  Lab 02/25/22 1316 02/26/22 0948 02/27/22 0410 02/28/22 0402 03/01/22 0353 03/02/22 0600  NA  --  138 139 138 139 139  K  --  4.3 4.0 4.5 4.5 4.2  CL  --  108 110 108 110 110  CO2  --  20* 21* 21* 20* 20*  GLUCOSE  --  296* 230* 226* 141* 199*  BUN  --  67* 83* 89* 93* 93*  CREATININE  --  5.57* 5.87* 5.91* 5.74* 5.50*   CALCIUM  --  8.4* 8.0* 8.0* 7.9* 7.9*  MG 2.2  --  2.3 2.3 2.2 2.2  PHOS 5.4*  --   --   --   --   --    GFR: Estimated Creatinine Clearance: 15.2 mL/min (A) (by C-G formula based on SCr of 5.5 mg/dL (H)). Liver Function Tests: Recent Labs  Lab 02/25/22 1005 02/26/22 0948  AST 19 21  ALT 14 15  ALKPHOS 71 69  BILITOT 0.5 0.7  PROT 7.2 7.0  ALBUMIN 2.8* 2.7*   No results for input(s): "LIPASE", "AMYLASE" in the last 168 hours. No results for input(s): "AMMONIA" in the last 168 hours. Coagulation Profile: No results for input(s): "INR", "PROTIME" in the last 168 hours. Cardiac Enzymes: No results for input(s): "CKTOTAL", "CKMB", "CKMBINDEX", "TROPONINI" in the last 168 hours. BNP (last 3 results) No results for input(s): "PROBNP" in the last 8760 hours. HbA1C: No results for input(s): "HGBA1C" in the last 72 hours.  CBG: Recent Labs  Lab 03/01/22 1156 03/01/22 1744 03/01/22 2057 03/02/22 0813 03/02/22 1138  GLUCAP 268* 128* 213* 193* 304*   Lipid Profile: No results for input(s): "CHOL", "HDL", "LDLCALC", "TRIG", "CHOLHDL", "LDLDIRECT" in the last 72 hours. Thyroid Function Tests: No results for input(s): "TSH", "T4TOTAL", "FREET4", "T3FREE", "THYROIDAB" in the last 72 hours.  Anemia Panel: No results for input(s): "VITAMINB12", "FOLATE", "FERRITIN", "TIBC", "IRON", "RETICCTPCT" in the last 72 hours. Urine analysis:    Component Value Date/Time   COLORURINE YELLOW (A) 02/28/2022 0958   APPEARANCEUR CLOUDY (A) 02/28/2022 0958   APPEARANCEUR Hazy (A) 08/19/2019 1113   LABSPEC 1.013 02/28/2022 0958   LABSPEC 1.008 11/27/2013 0810   PHURINE 5.0 02/28/2022 0958   GLUCOSEU >=500 (A) 02/28/2022 0958   GLUCOSEU Negative 11/27/2013 0810   HGBUR LARGE (A) 02/28/2022 0958   BILIRUBINUR NEGATIVE 02/28/2022 0958   BILIRUBINUR Negative 08/19/2019 1113   BILIRUBINUR Negative 11/27/2013 0810   KETONESUR 5 (A) 02/28/2022 0958   PROTEINUR >=300 (A) 02/28/2022 0958    NITRITE NEGATIVE 02/28/2022 0958   LEUKOCYTESUR MODERATE (A) 02/28/2022 0958   LEUKOCYTESUR Negative 11/27/2013 0810   Sepsis Labs: '@LABRCNTIP'$ (procalcitonin:4,lacticidven:4)  Recent Results (from the past 240 hour(s))  Urine Culture     Status: None   Collection Time: 02/28/22 12:50 PM   Specimen: Urine, Catheterized  Result Value Ref Range Status   Specimen Description   Final    URINE, CATHETERIZED Performed at [facility], [address] [place], [place] [address]    Special Requests   Final    NONE Performed at [facility], [address] [place], [place] [address]    Culture   Final    NO GROWTH Performed at [facility], [place] [address], [place],  [place] [address]    Report Status 03/01/2022 FINAL  Final         Radiology Studies last 96 hours: DG OR UROLOGY CYSTO IMAGE ([facility] ONLY)  Result Date: 02/27/2022 There is no interpretation for this exam.  This order is for images obtained during a surgical procedure.  Please See "Surgeries" Tab for more information regarding the procedure.            LOS: 4 days     Emeterio Reeve, DO [facility] 03/02/2022, 1:18 PM   Staff may message me via secure chat in [place]  but this may not receive immediate response,  please page for urgent matters!  If 7PM-7AM, please contact night-coverage [email or web address]  Dictation software was used to generate the above note. Typos may occur and escape review, as with typed/written notes. Please contact Dr Sheppard Coil directly for clarity if needed.

## 2022-03-03 DIAGNOSIS — R600 Localized edema: Secondary | ICD-10-CM | POA: Diagnosis not present

## 2022-03-03 DIAGNOSIS — N179 Acute kidney failure, unspecified: Secondary | ICD-10-CM | POA: Diagnosis not present

## 2022-03-03 DIAGNOSIS — I5032 Chronic diastolic (congestive) heart failure: Secondary | ICD-10-CM | POA: Diagnosis not present

## 2022-03-03 DIAGNOSIS — R1030 Lower abdominal pain, unspecified: Secondary | ICD-10-CM | POA: Diagnosis not present

## 2022-03-03 LAB — BASIC METABOLIC PANEL
Anion gap: 7 (ref 5–15)
BUN: 90 mg/dL — ABNORMAL HIGH (ref 8–23)
CO2: 21 mmol/L — ABNORMAL LOW (ref 22–32)
Calcium: 8 mg/dL — ABNORMAL LOW (ref 8.9–10.3)
Chloride: 113 mmol/L — ABNORMAL HIGH (ref 98–111)
Creatinine, Ser: 5.14 mg/dL — ABNORMAL HIGH (ref 0.61–1.24)
GFR, Estimated: 11 mL/min — ABNORMAL LOW (ref >60)
Glucose, Bld: 154 mg/dL — ABNORMAL HIGH (ref 70–99)
Potassium: 3.9 mmol/L (ref 3.5–5.1)
Sodium: 141 mmol/L (ref 135–145)

## 2022-03-03 LAB — GLUCOSE, CAPILLARY
Glucose-Capillary: 206 mg/dL — ABNORMAL HIGH (ref 70–99)
Glucose-Capillary: 310 mg/dL — ABNORMAL HIGH (ref 70–99)
Glucose-Capillary: 146 mg/dL — ABNORMAL HIGH (ref 70–99)
Glucose-Capillary: 157 mg/dL — ABNORMAL HIGH (ref 70–99)

## 2022-03-03 LAB — MAGNESIUM: Magnesium: 2.1 mg/dL (ref 1.7–2.4)

## 2022-03-03 NOTE — TOC Progression Note (Addendum)
Transition of Care [facility]) - Progression Note    Patient Details  Name: James Clarke MRN: [id number] Date of Birth: 12/09/1940  Transition of [facility]) CM/SW Contact  Izola Price, RN Phone Number: 03/03/2022, 5:09 PM  Clinical Narrative:  8/13: New HH recommendations noted. Reached out to [place] and [facility] after speaking to patient and patient wife. No preference on [facility] agency. [facility] comes out very three months for wellness checks. Simmie Davies RN CM          Expected Discharge Plan and Services                                                 Social Determinants of Health (SDOH) Interventions    Readmission Risk Interventions    02/27/2022    4:22 PM  Readmission Risk Prevention Plan  Transportation Screening Complete  HRI or Home Care Consult Complete  Palliative Care Screening Not Applicable  Medication Review (RN Care Manager) Complete

## 2022-03-03 NOTE — TOC Initial Note (Signed)
Transition of Care [name] - Initial/Assessment Note    Patient Details  Name: James Clarke MRN: [id number] Date of Birth: 18-Jun-1941  Transition of Care [facility]) CM/SW Contact:    James Price, RN Phone Number: 03/03/2022, 5:18 PM  Clinical Narrative:  8/13: Spoke with patient and spouse. Lives with wife who is home all the time. Lives in one level handicap access home with rails and wide doorways. Three steps into house from garage, and  wo steps in the front with rails.                PCP: James Clarke Rx: [facility] [place], James Clarke,James (Spouse)  [phone] (Mobile)       Patient Goals and CMS Choice        Expected Discharge Plan and Services                                                Prior Living Arrangements/Services                       Activities of Daily Living Home Assistive Devices/Equipment: Gilford Rile (specify type), Eyeglasses ADL Screening (condition at time of admission) Patient's cognitive ability adequate to safely complete daily activities?: Yes Is the patient deaf or have difficulty hearing?: No Does the patient have difficulty seeing, even when wearing glasses/contacts?: No Does the patient have difficulty concentrating, remembering, or making decisions?: No Patient able to express need for assistance with ADLs?: Yes Does the patient have difficulty dressing or bathing?: No Independently performs ADLs?: No Communication: Independent Dressing (OT): Needs assistance Is this a change from baseline?: Change from baseline, expected to last <3days Grooming: Needs assistance Is this a change from baseline?: Change from baseline, expected to last <3 days Feeding: Independent Bathing: Needs assistance Is this a change from baseline?: Change from baseline, expected to last <3 days Toileting: Needs assistance Is this a change from baseline?: Change from baseline, expected to last <3 days In/Out Bed: Needs assistance Is  this a change from baseline?: Change from baseline, expected to last <3 days Walks in Home: Independent with device (comment) Does the patient have difficulty walking or climbing stairs?: Yes Weakness of Legs: Both Weakness of Arms/Hands: None  Permission Sought/Granted                  Emotional Assessment              Admission diagnosis:  Bilateral flank pain [R10.9] Suprapubic abdominal pain [R10.2] Abdominal pain [R10.9] Acute renal failure superimposed on chronic kidney disease, unspecified CKD stage, unspecified acute renal failure type ([place]) [N17.9, N18.9] AKI (acute kidney injury) ([place]) [N17.9] Patient Active Problem List   Diagnosis Date Noted   Skin irritation 03/01/2022   UTI (urinary tract infection) 02/28/2022   Hydronephrosis of left kidney    Flank pain 02/25/2022   Chronic diastolic CHF (congestive heart failure) ([place]) 02/25/2022   Acute renal failure superimposed on stage 4 chronic kidney disease ([place]) 02/25/2022   Right rotator cuff tear arthropathy 06/25/2021   Chronic right shoulder pain 06/25/2021   Localized osteoarthritis of right shoulder 06/25/2021   Chronic pain syndrome 83/03/4075   Acute systolic CHF (congestive heart failure) ([place]) 10/03/2020   Kidney stones 02/16/2020   Anemia in chronic kidney disease 07/26/2019   Chronic kidney disease, stage III (moderate) (  [place]) 07/26/2019   Edema of lower extremity 07/26/2019   Malignant essential hypertension 07/26/2019   Secondary hyperparathyroidism of renal origin ([place]) 07/26/2019   Left ureteral calculus 07/26/2019   Old complex tear of medial meniscus of right knee 10/07/2018   Primary osteoarthritis of right knee 10/07/2018   Fatty liver 04/16/2018   Aortic atherosclerosis ([place]) 04/16/2018   Bilateral leg edema 03/17/2018   Morbid obesity due to excess calories ([place]) 11/11/2014   Microalbuminuria 11/11/2014   Long-term insulin use ([place]) 11/11/2014   Perennial allergic rhinitis 09/12/2014    Type 2 diabetes mellitus with renal manifestations ([place]) 03/19/2014   Sleep apnea 03/19/2014   Hypertension 03/19/2014   Hyperlipemia 03/19/2014   PCP:  James Hartigan, MD Pharmacy:   [place], [address] [place] [address] Phone: [phone] Fax: [place], [address] [place] [address] Phone: [phone] Fax: [place], [place] [facility] [address] [place] [facility] [place] [address] Phone: [phone] Fax: [phone]     Social Determinants of Health (SDOH) Interventions    Readmission Risk Interventions    02/27/2022    4:22 PM  Readmission Risk Prevention Plan  Transportation Screening Complete  HRI or Home Care Consult Complete  Palliative Care Screening Not Applicable  Medication Review (RN Care Manager) Complete

## 2022-03-03 NOTE — Progress Notes (Signed)
PROGRESS NOTE    Sergio Zawislak   JOA:[id number] DOB: [birth year]  DOA: 02/25/2022 Date of Service: 03/03/22 PCP: Sofie Hartigan, MD     Brief Narrative / Hospital Course:  Keen Ewalt is a 81 y.o. male with past medical history of chronic kidney disease 4, hypertension, diabetes mellitus type 2, chronic CHF and bilateral leg edema (Echo 218 2018 may shows normal left ventricular cavity size systolic function normal ejection fraction between 50 to 55%), kidney stones, obstructive sleep apnea who presented 02/25/2022 with abdominal pain evaluated in the urgent care initially and was referred to the emergency room after being told that he has acute kidney injury. 08/07: BUN of 80 creatinine of 5.61 (baseline approx 2.5), GFR of 10, WBC 13.3, UA more than 300 protein and small hemoglobin with 0-5 RBCs and 0-5 WBCs. CT Abd/Pelv: stone at the left UVJ with accompanying hydronephrosis.  Renal US: unremarkable. Admitted for ARF on CKD3 suspected prerenal d/t obstructing 2-3 mm calculi within the distal left ureter, and dehydration from decreased po intake plus home lasix and Valsartan use. DVT study given edema but more likely amlodipine effect and hypoalbuminemia.  08/08: seen by urology and nephrology. Echo done: LVEF 55-60 w/ G2 DD.  08/09: Patient underwent left ureteroscopy  08/10: Cr 5.91, UA abnormal, started Rocephin for UTI pending cultures, will repeat urine lytes to confirm FeNa --> concern for intrinsic causes.  Nephrology recs: No need for dialysis at the moment. 08/11: Minimal improvement in Cr 5.74.  No growth on urine culture, continuing current IV antibiotics. Will recollect urine.  Patient also complaining of some discharge/irritation around urethra/glans, treating for yeast infection 08/12: Cr 5.5.  08/13: Cr 5.14.   Appreciate specialist recommendations regarding discharge goals: Nephrology -goals for creatinine, need to keep inpatient to monitor versus follow-up  outpatient? Urology -goals for any further procedures, UCX showed no growth and repeat urine culture results are pending but question inflammatory response/request recommendations for continued antibiotics, patient is asking about when the Foley catheter can be removed?  Consultants:  Urology Nephrology   Procedures: Echo L ureteroscopy 02/27/22    Subjective: Patient reports some discharge/irritation around urethra/glans, nonpainful. Still burning sensation, foley in place.      ASSESSMENT & PLAN:   Principal Problem:   Acute renal failure superimposed on stage 4 chronic kidney disease (HCC) Active Problems:   Type 2 diabetes mellitus with renal manifestations (HCC)   Sleep apnea   Hypertension   Bilateral leg edema   Left ureteral calculus   Chronic diastolic CHF (congestive heart failure) (HCC)   Hydronephrosis of left kidney   UTI (urinary tract infection)   Skin irritation   Acute renal failure superimposed on stage 4 chronic kidney disease (HCC) Cr 5.61 on presentation.  Baseline around 2.5. AKI due to obstruction, from obstructing 2-3 mm calculi within the distal left ureter, and/or dehydration from home lasix and Valsartan use. Nephrology and urology following - appreciate recommendations regarding goals for creatinine prior to discharge, any further procedures, any input on antibiotics other than current plan, patient is asking about when the catheter can be removed Serial BMP  Caution w/ IV fluids to avoid CHF exacerbation  Treating for UTI as above  Type 2 diabetes mellitus with renal manifestations (HCC) A1c 6.4, well controlled.   Hyperglycemia due to not resumed on home insulin Plan: resume home Levemir at 25u daily add mealtime 8u TID  Sleep apnea CPAP per home setting.  Hypertension hold home lasix and Valsartan  due to AKI cont home Toprol add hydralazine 50 mg Q8h due to elevated BP  Bilateral leg edema Not currently edematous. hold home  lasix 2/2 AKI  Chronic diastolic CHF (congestive heart failure) (HCC) not in acute exacerbation hold home lasix 2/2 AKI Echo done this admission  Hydronephrosis of left kidney Obstructing 2-3 mm calculi within the distal left ureter just proximal to the left ureterovesicular junction resulting in mild left hydronephrosis and moderate perinephric stranding. urology L ureteroscopy 08/09  Left ureteral calculus Urology: s/p L ureteroscopy 08/09  UTI (urinary tract infection) Rocephin initiated 02/28/2022, at 1 day postop left ureteroscopy Cultures pending --> no growth Urine recollected Urology following, may be inflammatory effect?  Skin irritation Glans penis / urethral meatus Suspect mild yeast infection versus irritation due to Foley catheter, recent instrumentation Diflucan ordered  Keep groin/penis dry as able      DVT prophylaxis: heparin Code Status: FULL Family Communication: Wife at bedside on rounds Disposition Plan / TOC needs: none at this time  Barriers to discharge / significant pending items: pending urology recs / discharge appropriateness postop, still AKI/ARF, anticipate will be here at least a few more days           Objective: Vitals:   03/02/22 1455 03/02/22 2003 03/03/22 0525 03/03/22 0733  BP: 138/61 (!) 142/61 (!) 133/58 136/70  Pulse: 79 83 83 85  Resp: '16 18 18 17  '$ Temp: 98.1 F (36.7 C) 98.3 F (36.8 C) 98.5 F (36.9 C) 97.7 F (36.5 C)  TempSrc: Oral Oral    SpO2: 99% 96% 94% 98%  Weight:   (!) 145.6 kg   Height:        Intake/Output Summary (Last 24 hours) at 03/03/2022 1121 Last data filed at 03/03/2022 0559 Gross per 24 hour  Intake --  Output 1900 ml  Net -1900 ml   Filed Weights   02/28/22 0330 03/02/22 0500 03/03/22 0525  Weight: (!) 146.1 kg (!) 145.6 kg (!) 145.6 kg    Examination:  Constitutional:  VS as above General Appearance: alert, well-nourished, NAD Ears, Nose, Mouth, Throat: Normal  appearance Neck: No masses, trachea midline Respiratory: Normal respiratory effort Breath sounds normal, no wheeze/rhonchi/rales Cardiovascular: S1/S2 normal, no murmur/rub/gallop auscultated No lower extremity edema Gastrointestinal: Nontender, no masses Musculoskeletal:  No clubbing/cyanosis of digits Neurological: No cranial nerve deficit on limited exam Psychiatric: Normal judgment/insight Normal mood and affect       Scheduled Medications:   amLODipine  10 mg Oral Daily   aspirin EC  81 mg Oral Daily   heparin  5,000 Units Subcutaneous Q8H   hydrALAZINE  50 mg Oral Q8H   insulin aspart  0-15 Units Subcutaneous TID WC   insulin aspart  10 Units Subcutaneous TID WC   insulin detemir  30 Units Subcutaneous Daily   metoprolol succinate  50 mg Oral Daily   nystatin   Topical BID   polyethylene glycol  34 g Oral BID   sodium chloride flush  3 mL Intravenous Q12H   tamsulosin  0.4 mg Oral Daily    Continuous Infusions:  cefTRIAXone (ROCEPHIN)  IV Stopped (03/02/22 1254)    PRN Medications:  acetaminophen **OR** acetaminophen, albuterol, hydrALAZINE, HYDROcodone-acetaminophen  Antimicrobials:  Anti-infectives (From admission, onward)    Start     Dose/Rate Route Frequency Ordered Stop   03/01/22 1430  fluconazole (DIFLUCAN) tablet 150 mg        150 mg Oral  Once 03/01/22 1339 03/01/22 1604   02/28/22  1300  cefTRIAXone (ROCEPHIN) 2 g in sodium chloride 0.9 % 100 mL IVPB        2 g 200 mL/hr over 30 Minutes Intravenous Every 24 hours 02/28/22 1208         Data Reviewed: I have personally reviewed following labs and imaging studies  CBC: Recent Labs  Lab 02/25/22 1316 02/26/22 0948 02/27/22 0410 02/28/22 0402 03/01/22 0353  WBC 13.3* 10.3 9.5 12.8* 12.2*  NEUTROABS 9.9*  --   --   --   --   HGB 11.3* 11.7* 10.0* 10.4* 10.7*  HCT 36.2* 37.0* 31.4* 32.9* 34.1*  MCV 91.4 90.2 89.5 90.1 90.0  PLT 290 280 278 299 937   Basic Metabolic Panel: Recent  Labs  Lab 02/25/22 1316 02/26/22 0948 02/27/22 0410 02/28/22 0402 03/01/22 0353 03/02/22 0600 03/03/22 0515  NA  --    < > 139 138 139 139 141  K  --    < > 4.0 4.5 4.5 4.2 3.9  CL  --    < > 110 108 110 110 113*  CO2  --    < > 21* 21* 20* 20* 21*  GLUCOSE  --    < > 230* 226* 141* 199* 154*  BUN  --    < > 83* 89* 93* 93* 90*  CREATININE  --    < > 5.87* 5.91* 5.74* 5.50* 5.14*  CALCIUM  --    < > 8.0* 8.0* 7.9* 7.9* 8.0*  MG 2.2  --  2.3 2.3 2.2 2.2 2.1  PHOS 5.4*  --   --   --   --   --   --    < > = values in this interval not displayed.   GFR: Estimated Creatinine Clearance: 16.3 mL/min (A) (by C-G formula based on SCr of 5.14 mg/dL (H)). Liver Function Tests: Recent Labs  Lab 02/25/22 1005 02/26/22 0948  AST 19 21  ALT 14 15  ALKPHOS 71 69  BILITOT 0.5 0.7  PROT 7.2 7.0  ALBUMIN 2.8* 2.7*   No results for input(s): "LIPASE", "AMYLASE" in the last 168 hours. No results for input(s): "AMMONIA" in the last 168 hours. Coagulation Profile: No results for input(s): "INR", "PROTIME" in the last 168 hours. Cardiac Enzymes: No results for input(s): "CKTOTAL", "CKMB", "CKMBINDEX", "TROPONINI" in the last 168 hours. BNP (last 3 results) No results for input(s): "PROBNP" in the last 8760 hours. HbA1C: No results for input(s): "HGBA1C" in the last 72 hours.  CBG: Recent Labs  Lab 03/02/22 0813 03/02/22 1138 03/02/22 1702 03/02/22 2107 03/03/22 0734  GLUCAP 193* 304* 140* 119* 206*   Lipid Profile: No results for input(s): "CHOL", "HDL", "LDLCALC", "TRIG", "CHOLHDL", "LDLDIRECT" in the last 72 hours. Thyroid Function Tests: No results for input(s): "TSH", "T4TOTAL", "FREET4", "T3FREE", "THYROIDAB" in the last 72 hours.  Anemia Panel: No results for input(s): "VITAMINB12", "FOLATE", "FERRITIN", "TIBC", "IRON", "RETICCTPCT" in the last 72 hours. Urine analysis:    Component Value Date/Time   COLORURINE YELLOW (A) 02/28/2022 0958   APPEARANCEUR CLOUDY (A)  02/28/2022 0958   APPEARANCEUR Hazy (A) 08/19/2019 1113   LABSPEC 1.013 02/28/2022 0958   LABSPEC 1.008 11/27/2013 0810   PHURINE 5.0 02/28/2022 0958   GLUCOSEU >=500 (A) 02/28/2022 0958   GLUCOSEU Negative 11/27/2013 0810   HGBUR LARGE (A) 02/28/2022 0958   BILIRUBINUR NEGATIVE 02/28/2022 0958   BILIRUBINUR Negative 08/19/2019 1113   BILIRUBINUR Negative 11/27/2013 0810   KETONESUR 5 (A) 02/28/2022 3428  PROTEINUR >=300 (A) 02/28/2022 0958   NITRITE NEGATIVE 02/28/2022 0958   LEUKOCYTESUR MODERATE (A) 02/28/2022 0958   LEUKOCYTESUR Negative 11/27/2013 0810   Sepsis Labs: '@LABRCNTIP'$ (procalcitonin:4,lacticidven:4)  Recent Results (from the past 240 hour(s))  Urine Culture     Status: None   Collection Time: 02/28/22 12:50 PM   Specimen: Urine, Catheterized  Result Value Ref Range Status   Specimen Description   Final    URINE, CATHETERIZED Performed at [facility], [address] [place], [place] [address]    Special Requests   Final    NONE Performed at [facility], [address], [place], [place] [address]    Culture   Final    NO GROWTH Performed at [facility], [place] [address] [place], [place] [address]    Report Status 03/01/2022 FINAL  Final         Radiology Studies last 96 hours: No results found.          LOS: 5 days     Emeterio Reeve, DO [facility] 03/03/2022, 11:21 AM   Staff may message me via secure chat in [place]  but this may not receive immediate response,  please page for urgent matters!  If 7PM-7AM, please contact night-coverage [email or web address]  Dictation software was used to generate the above note. Typos may occur and escape review, as with typed/written notes. Please contact Dr Sheppard Coil directly for clarity if needed.

## 2022-03-03 NOTE — Progress Notes (Signed)
[facility]  PROGRESS NOTE   Subjective:   Patient states he feels much better today.  Family at bedside. Urine output has significantly improved.  Objective:  Vital signs: Blood pressure (!) 151/64, pulse 88, temperature 98.2 F (36.8 C), resp. rate 17, height '5\' 10"'$  (1.778 m), weight (!) 145.6 kg, SpO2 97 %.  Intake/Output Summary (Last 24 hours) at 03/03/2022 2037 Last data filed at 03/03/2022 1957 Gross per 24 hour  Intake --  Output 2400 ml  Net -2400 ml   Filed Weights   02/28/22 0330 03/02/22 0500 03/03/22 0525  Weight: (!) 146.1 kg (!) 145.6 kg (!) 145.6 kg     Physical Exam: General:  No acute distress  Head:  Normocephalic, atraumatic. Moist oral mucosal membranes  Eyes:  Anicteric  Neck:  Supple  Lungs:   Clear to auscultation, normal effort  Heart:  S1S2 no rubs  Abdomen:   Soft, nontender, bowel sounds present  Extremities:  peripheral edema.  Neurologic:  Awake, alert, following commands  Skin:  No lesions  Access:     Basic Metabolic Panel: Recent Labs  Lab 02/25/22 1316 02/26/22 0948 02/27/22 0410 02/28/22 0402 03/01/22 0353 03/02/22 0600 03/03/22 0515  NA  --    < > 139 138 139 139 141  K  --    < > 4.0 4.5 4.5 4.2 3.9  CL  --    < > 110 108 110 110 113*  CO2  --    < > 21* 21* 20* 20* 21*  GLUCOSE  --    < > 230* 226* 141* 199* 154*  BUN  --    < > 83* 89* 93* 93* 90*  CREATININE  --    < > 5.87* 5.91* 5.74* 5.50* 5.14*  CALCIUM  --    < > 8.0* 8.0* 7.9* 7.9* 8.0*  MG 2.2  --  2.3 2.3 2.2 2.2 2.1  PHOS 5.4*  --   --   --   --   --   --    < > = values in this interval not displayed.    CBC: Recent Labs  Lab 02/25/22 1316 02/26/22 0948 02/27/22 0410 02/28/22 0402 03/01/22 0353  WBC 13.3* 10.3 9.5 12.8* 12.2*  NEUTROABS 9.9*  --   --   --   --   HGB 11.3* 11.7* 10.0* 10.4* 10.7*  HCT 36.2* 37.0* 31.4* 32.9* 34.1*  MCV 91.4 90.2 89.5 90.1 90.0  PLT 290 280 278 299 226     Urinalysis: No results for input(s):  "COLORURINE", "LABSPEC", "PHURINE", "GLUCOSEU", "HGBUR", "BILIRUBINUR", "KETONESUR", "PROTEINUR", "UROBILINOGEN", "NITRITE", "LEUKOCYTESUR" in the last 72 hours.  Invalid input(s): "APPERANCEUR"    Imaging: No results found.   Medications:    cefTRIAXone (ROCEPHIN)  IV 2 g (03/03/22 1328)    amLODipine  10 mg Oral Daily   aspirin EC  81 mg Oral Daily   heparin  5,000 Units Subcutaneous Q8H   hydrALAZINE  50 mg Oral Q8H   insulin aspart  0-15 Units Subcutaneous TID WC   insulin aspart  10 Units Subcutaneous TID WC   insulin detemir  30 Units Subcutaneous Daily   metoprolol succinate  50 mg Oral Daily   nystatin   Topical BID   polyethylene glycol  34 g Oral BID   sodium chloride flush  3 mL Intravenous Q12H   tamsulosin  0.4 mg Oral Daily    Assessment/ Plan:     Principal Problem:  Acute renal failure superimposed on stage 4 chronic kidney disease (HCC) Active Problems:   Type 2 diabetes mellitus with renal manifestations (HCC)   Sleep apnea   Hypertension   Bilateral leg edema   Left ureteral calculus   Chronic diastolic CHF (congestive heart failure) (HCC)   Hydronephrosis of left kidney   UTI (urinary tract infection)   Skin irritation  Mr. Deadrian Toya is a 81 y.o.  male with past medical conditions including Bell's palsy, diabetes, hypertension, hyperlipidemia, sleep apnea on CPAP, and chronic kidney disease stage IV.  Patient presents to the emergency department at the advice of his nephrologist for abnormal renal function.  Patient has been admitted for Bilateral flank pain [R10.9] Suprapubic abdominal pain [R10.2] Abdominal pain [R10.9] Acute renal failure superimposed on chronic kidney disease, unspecified CKD stage, unspecified acute renal failure type ([place]) [N17.9, N18.9] AKI (acute kidney injury) ([place]) [N17.9]   #1: Acute kidney injury on CKD stage IV: Patient has a baseline creatinine of 3.3.  Patient is now at admitted with obstructive uropathy  secondary to nephrolithiasis.  Patient is status post ureteroscopy. Renal indices are slowly but steadily improving.  We will continue to monitor urine output closely.   #2: Hypertension: Continue present antihypertensive treatment.  Diuretics on hold at this time.   #3: Diabetes: Monitor blood glucose closely.   #4: Anemia: Anemia secondary to chronic kidney disease we will continue to monitor closely.   #5: Congestive heart failure: We will continue 2 g salt restricted diet with 1 L fluid restriction orally.   Spoke to the patient and his wife at bedside in detail and answered all their questions to their satisfaction.    LOS: [place], MD [facility] 8/13/20238:37 PM

## 2022-03-03 NOTE — Evaluation (Signed)
Physical Therapy Evaluation Patient Details Name: James Clarke MRN: [id number] DOB: 1940/08/07 Today's Date: 03/03/2022  History of Present Illness  admitted for acute hospitalization secondary to acute abdominal, back pain; admitted for management of AKI secondary to obstructive stone, s/p ureteroscopy with laser lithotripsy with stent placement (02/27/22)  Clinical Impression  Patient resting in bed upon arrival to room; son at bedside, supportive and encouraging to patient.  Patient alert and oriented to basic information; follows commands and agreeable to additional mobility efforts this date (ambulated with mobility specialist earlier in the day).  Endorses persistent abdominal/back pain (FACES 6/10), worsened with transitional movements.  Bilat UE/LE strength and ROM grossly symmetrical and WFL; no focal weakness appreciated.  Does have significant +3 pitting edema to bilat LEs. Currently able to complete bed mobility with min assist; sit/stand, basic transfers and gait (200') with RW, cga/close sup.  Demonstrates mild forward flexed posture; good step height/length; decreased cadence, but no buckling or LOB. Mild SOB, sats >94% on RA throughout distance Would benefit from skilled PT to address above deficits and promote optimal return to PLOF..; hhpt  Of note, patient endorses taking lasix at home prior to admission and asks if he is taking while hospitalized; question relayed to RN/MD for awareness/discussion as needed.       Recommendations for follow up therapy are one component of a multi-disciplinary discharge planning process, led by the attending physician.  Recommendations may be updated based on patient status, additional functional criteria and insurance authorization.  Follow Up Recommendations Home health PT      Assistance Recommended at Discharge PRN  Patient can return home with the following  A little help with walking and/or transfers;A little help with  bathing/dressing/bathroom    Equipment Recommendations Rolling walker (2 wheels) (bariatric)  Recommendations for Other Services       Functional Status Assessment Patient has had a recent decline in their functional status and demonstrates the ability to make significant improvements in function in a reasonable and predictable amount of time.     Precautions / Restrictions Precautions Precautions: Fall Restrictions Weight Bearing Restrictions: No      Mobility  Bed Mobility Overal bed mobility: Needs Assistance Bed Mobility: Supine to Sit, Sit to Supine     Supine to sit: Min assist Sit to supine: Supervision        Transfers Overall transfer level: Needs assistance Equipment used: Rolling walker (2 wheels) Transfers: Sit to/from Stand Sit to Stand: Min guard           General transfer comment: cuing for hand placement to prevent pulling on RW    Ambulation/Gait Ambulation/Gait assistance: Min guard Gait Distance (Feet): 200 Feet Assistive device: Rolling walker (2 wheels)         General Gait Details: mild forward flexed posture; good step height/length; decreased cadence, but no buckling or LOB. Mild SOB, sats >94% on RA throughout distance  Stairs            Wheelchair Mobility    Modified Rankin (Stroke Patients Only)       Balance Overall balance assessment: Needs assistance Sitting-balance support: No upper extremity supported, Feet supported Sitting balance-Leahy Scale: Good     Standing balance support: Bilateral upper extremity supported Standing balance-Leahy Scale: Fair                               Pertinent Vitals/Pain Pain Assessment Pain Assessment: Faces Faces  Pain Scale: Hurts even more Pain Location: abdomen, back Pain Descriptors / Indicators: Aching, Grimacing Pain Intervention(s): Limited activity within patient's tolerance, Monitored during session, Repositioned    Home Living Family/patient  expects to be discharged to:: Private residence Living Arrangements: Spouse/significant other Available Help at Discharge: Family;Available PRN/intermittently Type of Home: House Home Access: Stairs to enter Entrance Stairs-Rails: Right Entrance Stairs-Number of Steps: 3-4   Home Layout: One level Home Equipment: Cane - single point      Prior Function Prior Level of Function : Independent/Modified Independent             Mobility Comments: Mod indep for ADLs, household mobility; intermittent use of SPC for community mobilization.  Denies fall history within previous six months       Hand Dominance        Extremity/Trunk Assessment   Upper Extremity Assessment Upper Extremity Assessment: Overall WFL for tasks assessed    Lower Extremity Assessment Lower Extremity Assessment: Generalized weakness (grossly 4-/5 throughout; +3 pitting edema throughout bilat LEs)       Communication   Communication: No difficulties  Cognition Arousal/Alertness: Awake/alert Behavior During Therapy: WFL for tasks assessed/performed Overall Cognitive Status: Within Functional Limits for tasks assessed                                          General Comments      Exercises Other Exercises Other Exercises: Supine bilat LE therex, 1x10, active ROM for muscular strength/endurance.  Encouarged performance of supine therex outside of therapy as HEP for strengthening and edema management.  Patient voiced understanding.   Assessment/Plan    PT Assessment Patient needs continued PT services  PT Problem List Decreased strength;Decreased balance;Decreased mobility;Cardiopulmonary status limiting activity;Obesity;Pain;Decreased activity tolerance       PT Treatment Interventions DME instruction;Gait training;Stair training;Functional mobility training;Therapeutic activities;Therapeutic exercise;Balance training;Patient/family education    PT Goals (Current goals can be  found in the Care Plan section)  Acute Rehab PT Goals Patient Stated Goal: to return home PT Goal Formulation: With patient/family Time For Goal Achievement: 03/17/22 Potential to Achieve Goals: Good    Frequency Min 2X/week     Co-evaluation               AM-PAC PT "6 Clicks" Mobility  Outcome Measure Help needed turning from your back to your side while in a flat bed without using bedrails?: None Help needed moving from lying on your back to sitting on the side of a flat bed without using bedrails?: A Little Help needed moving to and from a bed to a chair (including a wheelchair)?: A Little Help needed standing up from a chair using your arms (e.g., wheelchair or bedside chair)?: A Little Help needed to walk in hospital room?: A Little Help needed climbing 3-5 steps with a railing? : A Little 6 Click Score: 19    End of Session Equipment Utilized During Treatment: Gait belt Activity Tolerance: Patient tolerated treatment well Patient left: in bed;with bed alarm set;with call bell/phone within reach;with family/visitor present Nurse Communication: Mobility status PT Visit Diagnosis: Muscle weakness (generalized) (M62.81);Difficulty in walking, not elsewhere classified (R26.2)    Time: 1425-1500 PT Time Calculation (min) (ACUTE ONLY): 35 min   Charges:   PT Evaluation $PT Eval Moderate Complexity: 1 Mod PT Treatments $Gait Training: 8-22 mins $Therapeutic Exercise: 8-22 mins  Deshawn Witty H. Owens Shark, PT, DPT, NCS 03/03/22, 3:54 PM [phone]

## 2022-03-03 NOTE — Progress Notes (Signed)
Mobility Specialist - Progress Note    03/03/22 1300  Mobility  Activity Ambulated with assistance in hallway;Stood at bedside;Dangled on edge of bed  Level of Assistance Standby assist, set-up cues, supervision of patient - no hands on  Assistive Device Front wheel walker  Distance Ambulated (ft) 160 ft  Activity Response Tolerated well  $Mobility charge 1 Mobility    Pt semi supine upon arrival using RA. Completes bed mobility MinA (HHA) to sit EOB. Completes STS SBA and ambulates 1 lap around NS with supervision -- 2 standing rest breaks taken due to voiced fatigue. Voices feeling significantly weaker than baseline, tolerates well but fatigues quickly. Pt ambulates to toilet for BM, left with family present. Pt has been getting to and from bathroom ad lib, left with needs in reach.  Merrily Brittle Mobility Specialist 03/03/22, 1:13 PM

## 2022-03-04 DIAGNOSIS — I5032 Chronic diastolic (congestive) heart failure: Secondary | ICD-10-CM | POA: Diagnosis not present

## 2022-03-04 DIAGNOSIS — R600 Localized edema: Secondary | ICD-10-CM | POA: Diagnosis not present

## 2022-03-04 DIAGNOSIS — R197 Diarrhea, unspecified: Secondary | ICD-10-CM

## 2022-03-04 DIAGNOSIS — R1030 Lower abdominal pain, unspecified: Secondary | ICD-10-CM | POA: Diagnosis not present

## 2022-03-04 DIAGNOSIS — N179 Acute kidney failure, unspecified: Secondary | ICD-10-CM | POA: Diagnosis not present

## 2022-03-04 LAB — BASIC METABOLIC PANEL
Anion gap: 8 (ref 5–15)
BUN: 85 mg/dL — ABNORMAL HIGH (ref 8–23)
CO2: 20 mmol/L — ABNORMAL LOW (ref 22–32)
Calcium: 8.2 mg/dL — ABNORMAL LOW (ref 8.9–10.3)
Chloride: 115 mmol/L — ABNORMAL HIGH (ref 98–111)
Creatinine, Ser: 4.81 mg/dL — ABNORMAL HIGH (ref 0.61–1.24)
GFR, Estimated: 11 mL/min — ABNORMAL LOW (ref >60)
Glucose, Bld: 131 mg/dL — ABNORMAL HIGH (ref 70–99)
Potassium: 3.6 mmol/L (ref 3.5–5.1)
Sodium: 143 mmol/L (ref 135–145)

## 2022-03-04 LAB — GASTROINTESTINAL PANEL BY PCR, STOOL (REPLACES STOOL CULTURE)

## 2022-03-04 LAB — CBC
HCT: 32.8 % — ABNORMAL LOW (ref 39.0–52.0)
Hemoglobin: 10.3 g/dL — ABNORMAL LOW (ref 13.0–17.0)
MCH: 28.2 pg (ref 26.0–34.0)
MCHC: 31.4 g/dL (ref 30.0–36.0)
MCV: 89.9 fL (ref 80.0–100.0)
Platelets: 316 10*3/uL (ref 150–400)
RBC: 3.65 MIL/uL — ABNORMAL LOW (ref 4.22–5.81)
RDW: 14 % (ref 11.5–15.5)
WBC: 9 10*3/uL (ref 4.0–10.5)
nRBC: 0 % (ref 0.0–0.2)

## 2022-03-04 LAB — GLUCOSE, CAPILLARY
Glucose-Capillary: 215 mg/dL — ABNORMAL HIGH (ref 70–99)
Glucose-Capillary: 254 mg/dL — ABNORMAL HIGH (ref 70–99)
Glucose-Capillary: 240 mg/dL — ABNORMAL HIGH (ref 70–99)
Glucose-Capillary: 102 mg/dL — ABNORMAL HIGH (ref 70–99)

## 2022-03-04 LAB — HEPATITIS B SURFACE ANTIBODY,QUALITATIVE: Hep B S Ab: NONREACTIVE

## 2022-03-04 LAB — HEPATITIS B CORE ANTIBODY, TOTAL: Hep B Core Total Ab: NONREACTIVE

## 2022-03-04 LAB — HEPATITIS B SURFACE ANTIGEN: Hepatitis B Surface Ag: NONREACTIVE

## 2022-03-04 LAB — HEPATITIS C ANTIBODY: HCV Ab: NONREACTIVE

## 2022-03-04 MED ORDER — CHLORHEXIDINE GLUCONATE CLOTH 2 % EX PADS
6.0000 | MEDICATED_PAD | Freq: Every day | CUTANEOUS | Status: DC
  Administered 2022-03-05 – 2022-03-07 (×3): 6 via TOPICAL

## 2022-03-04 MED ORDER — LOPERAMIDE HCL 2 MG PO CAPS
4.0000 mg | ORAL_CAPSULE | ORAL | Status: DC | PRN
  Administered 2022-03-04 – 2022-03-07 (×4): 4 mg via ORAL
  Filled 2022-03-04 (×4): qty 2

## 2022-03-04 NOTE — Progress Notes (Signed)
PT Cancellation Note  Patient Details Name: James Clarke MRN: [id number] DOB: May 31, 1941   Cancelled Treatment:    Reason Eval/Treat Not Completed: Other (comment). Pt politely declined, stated he is having a hard day. Family endorsed he has been up in the room several times to the bathroom. PT to re-attempt as able.   [name] PT, DPT 1:35 PM,03/04/22

## 2022-03-04 NOTE — Care Management Important Message (Signed)
Important Message  Patient Details  Name: James Clarke MRN: [id number] Date of Birth: 08-Aug-1940   Medicare Important Message Given:  Yes     [name] 03/04/2022, 11:22 AM

## 2022-03-04 NOTE — Progress Notes (Addendum)
PROGRESS NOTE    James Clarke   UVO:[id number] DOB: 1941-01-05  DOA: 02/25/2022 Date of Service: 03/04/22 PCP: Sofie Hartigan, MD     Brief Narrative / Hospital Course:  James Clarke is a 81 y.o. male with past medical history of chronic kidney disease 4, hypertension, diabetes mellitus type 2, chronic CHF and bilateral leg edema (Echo 218 2018 may shows normal left ventricular cavity size systolic function normal ejection fraction between 50 to 55%), kidney stones, obstructive sleep apnea who presented 02/25/2022 with abdominal pain evaluated in the urgent care initially and was referred to the emergency room after being told that he has acute kidney injury. 08/07: BUN of 80 creatinine of 5.61 (baseline approx 2.5), GFR of 10, WBC 13.3, UA more than 300 protein and small hemoglobin with 0-5 RBCs and 0-5 WBCs. CT Abd/Pelv: stone at the left UVJ with accompanying hydronephrosis.  Renal US: unremarkable. Admitted for ARF on CKD3 suspected prerenal d/t obstructing 2-3 mm calculi within the distal left ureter, and dehydration from decreased po intake plus home lasix and Valsartan use. DVT study given edema but more likely amlodipine effect and hypoalbuminemia.  08/08: seen by urology and nephrology. Echo done: LVEF 55-60 w/ G2 DD.  08/09: Patient underwent left ureteroscopy  08/10: Cr 5.91, UA abnormal, started Rocephin for UTI pending cultures, will repeat urine lytes to confirm FeNa --> concern for intrinsic causes.  Nephrology recs: No need for dialysis at the moment. 08/11: Minimal improvement in Cr 5.74.  No growth on urine culture, continuing current IV antibiotics. Will recollect urine.  Patient also complaining of some discharge/irritation around urethra/glans, treating for yeast infection 08/12: Cr 5.5.  08/13: Cr 5.14.  08/14: Cr 4.8. Still pending UCx result from 08/11 recollection. Nephrology team is planning for temporary dialysis tomorrow. Pt reports continued loose stool w/  episodes of stool incontinence, no fever, will obtain GI PCR. Foley d/c tomorrow for voiding trial per urology. Consult vascular surgery for placement of temp cath tomorrow for dialysis which should hopefully also help fluid overload since lasix unable to be provided d/t renal dysfunction.  Appreciate specialist recommendations: Urology -goals for any further procedures, UCX showed no growth and repeat urine culture results are pending but question inflammatory response/request recommendations for continued antibiotics, patient is asking about when the Foley catheter can be removed?  Consultants:  Urology Nephrology   Procedures: Echo L ureteroscopy 02/27/22    Subjective: Patient reports some discharge/irritation around urethra/glans, nonpainful. Still burning sensation, foley in place.      ASSESSMENT & PLAN:   Principal Problem:   Acute renal failure superimposed on stage 4 chronic kidney disease (HCC) Active Problems:   Type 2 diabetes mellitus with renal manifestations (HCC)   Sleep apnea   Hypertension   Bilateral leg edema   Left ureteral calculus   Chronic diastolic CHF (congestive heart failure) (HCC)   Hydronephrosis of left kidney   UTI (urinary tract infection)   Skin irritation   Diarrhea  Acute renal failure superimposed on stage 4 chronic kidney disease (HCC) Cr 5.61 on presentation.  Baseline around 2.5. AKI due to obstruction, from obstructing 2-3 mm calculi within the distal left ureter, and/or dehydration from home lasix and Valsartan use. Nephrology and urology following Nephrology planning for dialysis tomorrow  Serial BMP  Caution w/ IV fluids to avoid CHF exacerbation  Treating for UTI as above  Type 2 diabetes mellitus with renal manifestations (HCC) A1c 6.4, well controlled.   Hyperglycemia due to  not resumed on home insulin Plan: resume home Levemir at 25u daily add mealtime 8u TID  Sleep apnea CPAP per home setting.  Hypertension hold  home lasix and Valsartan due to AKI cont home Toprol add hydralazine 50 mg Q8h due to elevated BP  Bilateral leg edema Not currently edematous. hold home lasix 2/2 AKI PLanning dialysis hopefully will improve edema  Chronic diastolic CHF (congestive heart failure) (HCC) not in acute exacerbation hold home lasix 2/2 AKI Echo done this admission Planning for dialysis may help fluid status he appears very slightly overloaded   Hydronephrosis of left kidney Obstructing 2-3 mm calculi within the distal left ureter just proximal to the left ureterovesicular junction resulting in mild left hydronephrosis and moderate perinephric stranding. urology L ureteroscopy 08/09  Left ureteral calculus Urology: s/p L ureteroscopy 08/09  UTI (urinary tract infection) Rocephin initiated 02/28/2022, at 1 day postop left ureteroscopy Cultures pending --> no growth Urine recollected Urology following, may be inflammatory effect?  Skin irritation Glans penis / urethral meatus Suspect mild yeast infection versus irritation due to Foley catheter, recent instrumentation Diflucan ordered  Keep groin/penis dry as able   Diarrhea Seems most likely d/t antibiotics Seek input from urology re: need for continuation for abx for now awaiting repeat UCx vs if inflammatory effect on previous UA and can d/c  GI PCR pending, seems less likely CDiff      DVT prophylaxis: heparin Code Status: FULL Family Communication: Wife at bedside on rounds Disposition Plan / TOC needs: none at this time  Barriers to discharge / significant pending items: dialysis tomorrow, anticipate will be here until at least later this week            Objective: Vitals:   03/03/22 1533 03/03/22 2104 03/04/22 0249 03/04/22 0800  BP: (!) 151/64 (!) 143/66 (!) 132/58 (!) 153/63  Pulse: 88 77 79 84  Resp: '17 19 19 19  '$ Temp: 21.3 F (36.8 C) 98.1 F (36.7 C) 98 F (36.7 C) 98.4 F (36.9 C)  TempSrc:  Oral Oral   SpO2:  97% 98% 98% 98%  Weight:      Height:        Intake/Output Summary (Last 24 hours) at 03/04/2022 1533 Last data filed at 03/04/2022 1049 Gross per 24 hour  Intake 437.47 ml  Output 2200 ml  Net -1762.53 ml   Filed Weights   02/28/22 0330 03/02/22 0500 03/03/22 0525  Weight: (!) 146.1 kg (!) 145.6 kg (!) 145.6 kg    Examination:  Constitutional:  VS as above General Appearance: alert, well-nourished, NAD Ears, Nose, Mouth, Throat: Normal appearance Neck: No masses, trachea midline Respiratory: Normal respiratory effort Breath sounds normal, no wheeze/rhonchi/rales Cardiovascular: S1/S2 normal, no murmur/rub/gallop auscultated No lower extremity edema Gastrointestinal: Nontender, no masses Musculoskeletal:  No clubbing/cyanosis of digits Neurological: No cranial nerve deficit on limited exam Psychiatric: Normal judgment/insight Normal mood and affect       Scheduled Medications:   amLODipine  10 mg Oral Daily   aspirin EC  81 mg Oral Daily   [START ON 03/05/2022] Chlorhexidine Gluconate Cloth  6 each Topical Q0600   heparin  5,000 Units Subcutaneous Q8H   hydrALAZINE  50 mg Oral Q8H   insulin aspart  0-15 Units Subcutaneous TID WC   insulin aspart  10 Units Subcutaneous TID WC   insulin detemir  30 Units Subcutaneous Daily   metoprolol succinate  50 mg Oral Daily   nystatin   Topical BID   polyethylene  glycol  34 g Oral BID   sodium chloride flush  3 mL Intravenous Q12H   tamsulosin  0.4 mg Oral Daily    Continuous Infusions:  cefTRIAXone (ROCEPHIN)  IV 2 g (03/04/22 1329)    PRN Medications:  acetaminophen **OR** acetaminophen, albuterol, hydrALAZINE, HYDROcodone-acetaminophen  Antimicrobials:  Anti-infectives (From admission, onward)    Start     Dose/Rate Route Frequency Ordered Stop   03/01/22 1430  fluconazole (DIFLUCAN) tablet 150 mg        150 mg Oral  Once 03/01/22 1339 03/01/22 1604   02/28/22 1300  cefTRIAXone (ROCEPHIN) 2 g in sodium  chloride 0.9 % 100 mL IVPB        2 g 200 mL/hr over 30 Minutes Intravenous Every 24 hours 02/28/22 1208         Data Reviewed: I have personally reviewed following labs and imaging studies  CBC: Recent Labs  Lab 02/26/22 0948 02/27/22 0410 02/28/22 0402 03/01/22 0353 03/04/22 0400  WBC 10.3 9.5 12.8* 12.2* 9.0  HGB 11.7* 10.0* 10.4* 10.7* 10.3*  HCT 37.0* 31.4* 32.9* 34.1* 32.8*  MCV 90.2 89.5 90.1 90.0 89.9  PLT 280 278 299 226 970   Basic Metabolic Panel: Recent Labs  Lab 02/27/22 0410 02/28/22 0402 03/01/22 0353 03/02/22 0600 03/03/22 0515 03/04/22 0400  NA 139 138 139 139 141 143  K 4.0 4.5 4.5 4.2 3.9 3.6  CL 110 108 110 110 113* 115*  CO2 21* 21* 20* 20* 21* 20*  GLUCOSE 230* 226* 141* 199* 154* 131*  BUN 83* 89* 93* 93* 90* 85*  CREATININE 5.87* 5.91* 5.74* 5.50* 5.14* 4.81*  CALCIUM 8.0* 8.0* 7.9* 7.9* 8.0* 8.2*  MG 2.3 2.3 2.2 2.2 2.1  --    GFR: Estimated Creatinine Clearance: 17.4 mL/min (A) (by C-G formula based on SCr of 4.81 mg/dL (H)). Liver Function Tests: Recent Labs  Lab 02/26/22 0948  AST 21  ALT 15  ALKPHOS 69  BILITOT 0.7  PROT 7.0  ALBUMIN 2.7*   No results for input(s): "LIPASE", "AMYLASE" in the last 168 hours. No results for input(s): "AMMONIA" in the last 168 hours. Coagulation Profile: No results for input(s): "INR", "PROTIME" in the last 168 hours. Cardiac Enzymes: No results for input(s): "CKTOTAL", "CKMB", "CKMBINDEX", "TROPONINI" in the last 168 hours. BNP (last 3 results) No results for input(s): "PROBNP" in the last 8760 hours. HbA1C: No results for input(s): "HGBA1C" in the last 72 hours.  CBG: Recent Labs  Lab 03/03/22 1200 03/03/22 1651 03/03/22 2110 03/04/22 0802 03/04/22 1228  GLUCAP 310* 146* 157* 215* 254*   Lipid Profile: No results for input(s): "CHOL", "HDL", "LDLCALC", "TRIG", "CHOLHDL", "LDLDIRECT" in the last 72 hours. Thyroid Function Tests: No results for input(s): "TSH", "T4TOTAL",  "FREET4", "T3FREE", "THYROIDAB" in the last 72 hours.  Anemia Panel: No results for input(s): "VITAMINB12", "FOLATE", "FERRITIN", "TIBC", "IRON", "RETICCTPCT" in the last 72 hours. Urine analysis:    Component Value Date/Time   COLORURINE YELLOW (A) 02/28/2022 0958   APPEARANCEUR CLOUDY (A) 02/28/2022 0958   APPEARANCEUR Hazy (A) 08/19/2019 1113   LABSPEC 1.013 02/28/2022 0958   LABSPEC 1.008 11/27/2013 0810   PHURINE 5.0 02/28/2022 0958   GLUCOSEU >=500 (A) 02/28/2022 0958   GLUCOSEU Negative 11/27/2013 0810   HGBUR LARGE (A) 02/28/2022 0958   BILIRUBINUR NEGATIVE 02/28/2022 0958   BILIRUBINUR Negative 08/19/2019 1113   BILIRUBINUR Negative 11/27/2013 0810   KETONESUR 5 (A) 02/28/2022 0958   PROTEINUR >=300 (A) 02/28/2022 2637  NITRITE NEGATIVE 02/28/2022 0958   LEUKOCYTESUR MODERATE (A) 02/28/2022 0958   LEUKOCYTESUR Negative 11/27/2013 0810   Sepsis Labs: '@LABRCNTIP'$ (procalcitonin:4,lacticidven:4)  Recent Results (from the past 240 hour(s))  Urine Culture     Status: None   Collection Time: 02/28/22 12:50 PM   Specimen: Urine, Catheterized  Result Value Ref Range Status   Specimen Description   Final    URINE, CATHETERIZED Performed at [facility], [address], [place], [place] [address]    Special Requests   Final    NONE Performed at [facility], [address], [place], [place] [address]    Culture   Final    NO GROWTH Performed at [facility], [place] [address] [place], [place] [address]    Report Status 03/01/2022 FINAL  Final  Gastrointestinal Panel by PCR , Stool     Status: None   Collection Time: 03/04/22  1:18 PM   Specimen: Stool  Result Value Ref Range Status   Campylobacter species NOT DETECTED NOT DETECTED Final   Plesimonas shigelloides NOT DETECTED NOT DETECTED Final   Salmonella species NOT DETECTED NOT DETECTED Final   Yersinia enterocolitica NOT DETECTED NOT DETECTED Final   Vibrio species NOT DETECTED NOT  DETECTED Final   Vibrio cholerae NOT DETECTED NOT DETECTED Final   Enteroaggregative E coli (EAEC) NOT DETECTED NOT DETECTED Final   Enteropathogenic E coli (EPEC) NOT DETECTED NOT DETECTED Final   Enterotoxigenic E coli (ETEC) NOT DETECTED NOT DETECTED Final   Shiga like toxin producing E coli (STEC) NOT DETECTED NOT DETECTED Final   Shigella/Enteroinvasive E coli (EIEC) NOT DETECTED NOT DETECTED Final   Cryptosporidium NOT DETECTED NOT DETECTED Final   Cyclospora cayetanensis NOT DETECTED NOT DETECTED Final   Entamoeba histolytica NOT DETECTED NOT DETECTED Final   Giardia lamblia NOT DETECTED NOT DETECTED Final   Adenovirus F40/41 NOT DETECTED NOT DETECTED Final   Astrovirus NOT DETECTED NOT DETECTED Final   Norovirus GI/GII NOT DETECTED NOT DETECTED Final   Rotavirus A NOT DETECTED NOT DETECTED Final   Sapovirus (I, II, IV, and V) NOT DETECTED NOT DETECTED Final    Comment: Performed at [facility], [address], [place], [place] [address]         Radiology Studies last 96 hours: No results found.          LOS: 6 days     Emeterio Reeve, DO [facility] 03/04/2022, 3:33 PM   Staff may message me via secure chat in [place]  but this may not receive immediate response,  please page for urgent matters!  If 7PM-7AM, please contact night-coverage [email or web address]  Dictation software was used to generate the above note. Typos may occur and escape review, as with typed/written notes. Please contact Dr Sheppard Coil directly for clarity if needed.

## 2022-03-04 NOTE — Progress Notes (Signed)
[facility]  PROGRESS NOTE   Subjective:   Patient not as jovial as previous visits Concerned for increased edema in lower extremities Complains of shortness of breath with activity, states this is baseline Wife at bedside, states shortness of breath has gotten worse.  Creatinine 4.81 Urine output 1.6 L in preceding 24 hours  Objective:  Vital signs: Blood pressure (!) 153/63, pulse 84, temperature 98.4 F (36.9 C), resp. rate 19, height '5\' 10"'$  (1.778 m), weight (!) 145.6 kg, SpO2 98 %.  Intake/Output Summary (Last 24 hours) at 03/04/2022 1448 Last data filed at 03/04/2022 1049 Gross per 24 hour  Intake 437.47 ml  Output 2200 ml  Net -1762.53 ml    Filed Weights   02/28/22 0330 03/02/22 0500 03/03/22 0525  Weight: (!) 146.1 kg (!) 145.6 kg (!) 145.6 kg     Physical Exam: General:  No acute distress  Head:  Normocephalic, atraumatic. Moist oral mucosal membranes  Eyes:  Anicteric  Lungs:   Clear to auscultation, normal effort  Heart:  S1S2 no rubs  Abdomen:   Soft, nontender, bowel sounds present  Extremities:  1+ peripheral edema.  Neurologic:  Awake, alert, following commands  Skin:  No lesions  Access: None    Basic Metabolic Panel: Recent Labs  Lab 02/27/22 0410 02/28/22 0402 03/01/22 0353 03/02/22 0600 03/03/22 0515 03/04/22 0400  NA 139 138 139 139 141 143  K 4.0 4.5 4.5 4.2 3.9 3.6  CL 110 108 110 110 113* 115*  CO2 21* 21* 20* 20* 21* 20*  GLUCOSE 230* 226* 141* 199* 154* 131*  BUN 83* 89* 93* 93* 90* 85*  CREATININE 5.87* 5.91* 5.74* 5.50* 5.14* 4.81*  CALCIUM 8.0* 8.0* 7.9* 7.9* 8.0* 8.2*  MG 2.3 2.3 2.2 2.2 2.1  --      CBC: Recent Labs  Lab 02/26/22 0948 02/27/22 0410 02/28/22 0402 03/01/22 0353 03/04/22 0400  WBC 10.3 9.5 12.8* 12.2* 9.0  HGB 11.7* 10.0* 10.4* 10.7* 10.3*  HCT 37.0* 31.4* 32.9* 34.1* 32.8*  MCV 90.2 89.5 90.1 90.0 89.9  PLT 280 278 299 226 316      Urinalysis: No results for input(s):  "COLORURINE", "LABSPEC", "PHURINE", "GLUCOSEU", "HGBUR", "BILIRUBINUR", "KETONESUR", "PROTEINUR", "UROBILINOGEN", "NITRITE", "LEUKOCYTESUR" in the last 72 hours.  Invalid input(s): "APPERANCEUR"    Imaging: No results found.   Medications:    cefTRIAXone (ROCEPHIN)  IV 2 g (03/04/22 1329)    amLODipine  10 mg Oral Daily   aspirin EC  81 mg Oral Daily   heparin  5,000 Units Subcutaneous Q8H   hydrALAZINE  50 mg Oral Q8H   insulin aspart  0-15 Units Subcutaneous TID WC   insulin aspart  10 Units Subcutaneous TID WC   insulin detemir  30 Units Subcutaneous Daily   metoprolol succinate  50 mg Oral Daily   nystatin   Topical BID   polyethylene glycol  34 g Oral BID   sodium chloride flush  3 mL Intravenous Q12H   tamsulosin  0.4 mg Oral Daily    Assessment/ Plan:     Principal Problem:   Acute renal failure superimposed on stage 4 chronic kidney disease (HCC) Active Problems:   Type 2 diabetes mellitus with renal manifestations (HCC)   Sleep apnea   Hypertension   Bilateral leg edema   Left ureteral calculus   Chronic diastolic CHF (congestive heart failure) ([place])   Hydronephrosis of left kidney   UTI (urinary tract infection)   Skin irritation  Diarrhea  Mr. Ison Wichmann Faulcon is a 81 y.o.  male with past medical conditions including Bell's palsy, diabetes, hypertension, hyperlipidemia, sleep apnea on CPAP, and chronic kidney disease stage IV.  Patient presents to the emergency department at the advice of his nephrologist for abnormal renal function.  Patient has been admitted for Bilateral flank pain [R10.9] Suprapubic abdominal pain [R10.2] Abdominal pain [R10.9] Acute renal failure superimposed on chronic kidney disease, unspecified CKD stage, unspecified acute renal failure type ([place]) [N17.9, N18.9] AKI (acute kidney injury) ([place]) [N17.9]   #1: Acute kidney injury on CKD stage IV: Patient has a baseline creatinine of 3.3.  Patient is now at admitted with obstructive  uropathy secondary to nephrolithiasis.  Patient is status post ureteroscopy.  Creatinine improving slowly with adequate urine output recorded.  Levels remain outside of desired target to restart diuretic therapy.  Discussed this with patient and wife in the desire to initiate temporary dialysis.  Though patient is having adequate urine output, labs and patient presentation depict excess toxins.  Patient agreeable to initiate dialysis.  We will consult vascular surgery for placement of temp cath tomorrow.  After placement, patient will receive dialysis.  Believed to be a temporary treatment, did discuss with patient and wife that due to patient's kidney disease, it is too early to tell if patient will require prolonged dialysis.   #2: Hypertension: Continue present antihypertensive treatment.  Current blood pressure 153/63.   #3: Diabetes: Glucose elevated today, primary team has managed with sliding scale insulin.   #4: Anemia: Anemia secondary to chronic kidney disease.  Hemoglobin currently 10.3.  We will continue to monitor.   #5: Congestive heart failure: We will continue 2 g salt restricted diet with 1 L fluid restriction orally.  Diuretics remain held however lower extremity edema present.  Will manage fluid levels with dialysis.      LOS: [facility] 8/14/20232:48 PM

## 2022-03-04 NOTE — Evaluation (Addendum)
Occupational Therapy Evaluation Patient Details Name: James Clarke MRN: [id number] DOB: 25-Sep-1940 Today's Date: 03/04/2022   History of Present Illness Claron Rosencrans. Delfavero is an 81 y/o male admitted for acute hospitalization secondary to acute abdominal, back pain; admitted for management of AKI secondary to obstructive stone, s/p ureteroscopy with laser lithotripsy with stent placement (02/27/22).   Clinical Impression   Patient seen for OT evaluation. Patient presenting with BLE edema, decreased activity tolerance, decreased AROM, and decreased strength impacting safety and independence in ADLs. At baseline, pt requires physical assistance for ADLs (bathing, dressing) and IADLs (wife cooks/cleans, son cuts lawn). Pt lives with wife and has son nearby who can provide physical assistance as needed upon discharge. Pt very anxious and tearful after talking with MD during session about starting dialysis treatment tomorrow, deferring to complete OOB mobility and ADLs with OT. Pt would benefit from further OOB assessment at next session. Patient will benefit from acute OT to increase overall independence in the areas of ADLs and functional mobility in order to safely discharge home.      Recommendations for follow up therapy are one component of a multi-disciplinary discharge planning process, led by the attending physician.  Recommendations may be updated based on patient status, additional functional criteria and insurance authorization.   Follow Up Recommendations  Home health OT    Assistance Recommended at Discharge Frequent or constant Supervision/Assistance  Patient can return home with the following A little help with walking and/or transfers;A little help with bathing/dressing/bathroom;Assistance with cooking/housework;Assist for transportation;Help with stairs or ramp for entrance    Functional Status Assessment  Patient has had a recent decline in their functional status and demonstrates the  ability to make significant improvements in function in a reasonable and predictable amount of time.  Equipment Recommendations  BSC/3in1    Recommendations for Other Services       Precautions / Restrictions Precautions Precautions: Fall Restrictions Weight Bearing Restrictions: No      Mobility Bed Mobility Overal bed mobility: Needs Assistance             General bed mobility comments: NT, however, pt reports he cannot get out of hospital bed [facility]. Patient Response: Anxious, Restless  Transfers                          Balance       Sitting balance - Comments: NT, pt deferring OOB mobility       Standing balance comment: NT, pt deferring OOB mobility                           ADL either performed or assessed with clinical judgement   ADL Overall ADL's : Needs assistance/impaired                                       General ADL Comments: Pt deferring ADLs at this time - reported wife helping get him washed up in bed, completing oral care, and not needing to use bathroom. Anticipate Max A for LB dressing, Mod A for UB dressing, Mod-Max A for peri care, and Min A for grooming.      Vision Baseline Vision/History: 0 No visual deficits Patient Visual Report: No change from baseline  Pertinent Vitals/Pain Pain Assessment Pain Assessment: No/denies pain     Hand Dominance     Extremity/Trunk Assessment Upper Extremity Assessment Upper Extremity Assessment: Generalized weakness;RUE deficits/detail RUE Deficits / Details: Pt reports RUE deficits from shoulder surgery 1 yr ago, AROM limited to <90 deg   Lower Extremity Assessment Lower Extremity Assessment: Defer to PT evaluation   Cervical / Trunk Assessment Cervical / Trunk Assessment: Normal   Communication Communication Communication: No difficulties   Cognition Arousal/Alertness: Awake/alert Behavior During Therapy: WFL for tasks  assessed/performed Overall Cognitive Status: Within Functional Limits for tasks assessed                                 General Comments: Pt states that his "nerves are shot" with everything going on in hospital     General Comments  Pt with BLE edema               Home Living Family/patient expects to be discharged to:: Private residence Living Arrangements: Spouse/significant other Available Help at Discharge: Family;Available 24 hours/day Type of Home: House Home Access: Stairs to enter [employer] of Steps: 3-4 Entrance Stairs-Rails: Right Home Layout: One level     Bathroom Shower/Tub: Occupational psychologist: Handicapped height     Home Equipment: Cane - single point;Grab bars - tub/shower;Shower seat - built in (lift chair, adjustable bed frame)          Prior Functioning/Environment Prior Level of Function : Independent/Modified Independent             Mobility Comments: IND household mobility with no AD; intermittent use of SPC for community mobilization. Denies fall history within previous six months ADLs Comments: Needs assist for ADLs (bathing and dressing 2/2 shoulder sx 1 yr ago) and IADLs (wife cooks/cleans, son cuts lawn). Still drives, lives with wife who is caregiver and has son nearby.        OT Problem List: Decreased strength;Increased edema;Obesity;Decreased activity tolerance;Impaired balance (sitting and/or standing);Decreased knowledge of use of DME or AE;Decreased range of motion       OT Treatment/Interventions: Self-care/ADL training;Therapeutic exercise;Patient/family education;Balance training;Energy conservation;DME and/or AE instruction;Therapeutic activities    OT Goals(Current goals can be found in the care plan section) Acute Rehab OT Goals Patient Stated Goal: Pt and wife reporting they do not want STR, wife endorsing she can take care of pt's needs at home, discussed possible resources to  support pt and caregiver in addition to [facility] PT/OT including [facility] nurse (CM updated). OT Goal Formulation: With patient/family Time For Goal Achievement: 03/18/22 Potential to Achieve Goals: Good ADL Goals Pt Will Perform Grooming: with modified independence;sitting Pt Will Perform Upper Body Dressing: with modified independence;sitting Pt Will Perform Lower Body Dressing: with modified independence;sitting/lateral leans Pt Will Transfer to Toilet: with modified independence;bedside commode Pt Will Perform Toileting - Clothing Manipulation and hygiene: with modified independence;sit to/from stand  OT Frequency: Min 2X/week    Co-evaluation              AM-PAC OT "6 Clicks" Daily Activity     Outcome Measure Help from another person eating meals?: None Help from another person taking care of personal grooming?: A Little Help from another person toileting, which includes using toliet, bedpan, or urinal?: A Lot Help from another person bathing (including washing, rinsing, drying)?: A Lot Help from another person to put on and taking off regular upper body clothing?:  A Little Help from another person to put on and taking off regular lower body clothing?: A Lot 6 Click Score: 16   End of Session Nurse Communication: Mobility status  Activity Tolerance: Patient limited by fatigue;Other (comment) (pt anxious and tearful after talking with MD during session, deferring to complete OOB mobility and ADLs with OT) Patient left: in bed;with call bell/phone within reach;with bed alarm set;with family/visitor present  OT Visit Diagnosis: Unsteadiness on feet (R26.81);Muscle weakness (generalized) (M62.81)                Time: 1010-1030 OT Time Calculation (min): 20 min Charges:  OT General Charges $OT Visit: 1 Visit OT Evaluation $OT Eval Low Complexity: 1 Low  [facility] MS, OTR/L ascom [phone]  03/04/22, 1:36 PM

## 2022-03-04 NOTE — TOC Progression Note (Signed)
Transition of Care [facility]) - Progression Note    Patient Details  Name: James Clarke MRN: [id number] Date of Birth: April 13, 1941  [facility] of [facility]) CM/SW Contact  Beverly Sessions, RN Phone Number: 03/04/2022, 9:37 AM  Clinical Narrative:     Sharyn Creamer with [facility] confirms they can accept the patient for home health PT and OT        Expected Discharge Plan and Services                                                 Social Determinants of Health (SDOH) Interventions    Readmission Risk Interventions    02/27/2022    4:22 PM  Readmission Risk Prevention Plan  Transportation Screening Complete  HRI or [place] Complete  Palliative Care Screening Not Applicable  Medication Review (RN Care Manager) Complete

## 2022-03-04 NOTE — Inpatient Diabetes Management (Signed)
Inpatient Diabetes Program Recommendations  AACE/ADA: New Consensus Statement on Inpatient Glycemic Control (2015)  Target Ranges:  Prepandial:   less than 140 mg/dL      Peak postprandial:   less than 180 mg/dL (1-2 hours)      Critically ill patients:  140 - 180 mg/dL    Latest Reference Range & Units 03/03/22 07:34 03/03/22 12:00 03/03/22 16:51 03/03/22 21:10  Glucose-Capillary 70 - 99 mg/dL 206 (H)  15 units Novolog '@0914'$  310 (H)  21 units Novolog  30 units Levemir '@1047'$   146 (H)  12 units Novolog  157 (H)  (H): Data is abnormally high  Latest Reference Range & Units 03/04/22 08:02  Glucose-Capillary 70 - 99 mg/dL 215 (H)  15 units Novolog   (H): Data is abnormally high    Home DM Meds: Levemir 50 units daily        Novolog 30 units w/ Breakfast/ 30 units with Lunch/ 55 units with Dinner   Current Orders: Levemir 30 units daily  Novolog 0-15 units TID  Novolog 10 units TID with meals    MD- Please consider:  1. Increase Levemir slightly to 32 units Daily  2. Increase Novolog Meal Coverage to 12 units TID with meals    --Will follow patient during hospitalization--  Wyn Quaker RN, MSN, [place] Diabetes Coordinator Inpatient Glycemic Control Team Team Pager: [phone] (8a-5p)

## 2022-03-04 NOTE — Assessment & Plan Note (Addendum)
Seems most likely d/t antibiotics  Stop Rocephin.  Diarrhea has improved but he does report some stomach upset

## 2022-03-05 ENCOUNTER — Encounter
Admission: EM | Disposition: A | Discharge status: Home / Self Care | Payer: Self-pay | Source: Home / Self Care | Attending: Osteopathic Medicine

## 2022-03-05 ENCOUNTER — Encounter: Payer: Self-pay | Admitting: Vascular Surgery

## 2022-03-05 DIAGNOSIS — I5032 Chronic diastolic (congestive) heart failure: Secondary | ICD-10-CM | POA: Diagnosis not present

## 2022-03-05 DIAGNOSIS — Z9889 Other specified postprocedural states: Secondary | ICD-10-CM

## 2022-03-05 DIAGNOSIS — N185 Chronic kidney disease, stage 5: Secondary | ICD-10-CM | POA: Diagnosis not present

## 2022-03-05 DIAGNOSIS — N179 Acute kidney failure, unspecified: Secondary | ICD-10-CM | POA: Diagnosis not present

## 2022-03-05 DIAGNOSIS — E1122 Type 2 diabetes mellitus with diabetic chronic kidney disease: Secondary | ICD-10-CM

## 2022-03-05 DIAGNOSIS — R600 Localized edema: Secondary | ICD-10-CM | POA: Diagnosis not present

## 2022-03-05 DIAGNOSIS — I120 Hypertensive chronic kidney disease with stage 5 chronic kidney disease or end stage renal disease: Secondary | ICD-10-CM

## 2022-03-05 DIAGNOSIS — R1030 Lower abdominal pain, unspecified: Secondary | ICD-10-CM | POA: Diagnosis not present

## 2022-03-05 HISTORY — PX: TEMPORARY DIALYSIS CATHETER: CATH118312

## 2022-03-05 LAB — GLUCOSE, CAPILLARY
Glucose-Capillary: 175 mg/dL — ABNORMAL HIGH (ref 70–99)
Glucose-Capillary: 263 mg/dL — ABNORMAL HIGH (ref 70–99)
Glucose-Capillary: 171 mg/dL — ABNORMAL HIGH (ref 70–99)
Glucose-Capillary: 289 mg/dL — ABNORMAL HIGH (ref 70–99)

## 2022-03-05 LAB — BASIC METABOLIC PANEL
Anion gap: 10 (ref 5–15)
BUN: 78 mg/dL — ABNORMAL HIGH (ref 8–23)
CO2: 18 mmol/L — ABNORMAL LOW (ref 22–32)
Calcium: 8.3 mg/dL — ABNORMAL LOW (ref 8.9–10.3)
Chloride: 114 mmol/L — ABNORMAL HIGH (ref 98–111)
Creatinine, Ser: 4.53 mg/dL — ABNORMAL HIGH (ref 0.61–1.24)
GFR, Estimated: 12 mL/min — ABNORMAL LOW (ref >60)
Glucose, Bld: 179 mg/dL — ABNORMAL HIGH (ref 70–99)
Potassium: 3.9 mmol/L (ref 3.5–5.1)
Sodium: 142 mmol/L (ref 135–145)

## 2022-03-05 LAB — HEPATITIS B SURFACE ANTIBODY, QUANTITATIVE: Hep B S AB Quant (Post): 4.7 m[IU]/mL — ABNORMAL LOW (ref >9.9)

## 2022-03-05 SURGERY — TEMPORARY DIALYSIS CATHETER
Anesthesia: Moderate Sedation

## 2022-03-05 MED ORDER — ANTICOAGULANT SODIUM CITRATE 4% (200MG/5ML) IV SOLN: 5.0000 mL | Status: DC | PRN

## 2022-03-05 MED ORDER — SODIUM CHLORIDE 0.9 % IV SOLN: INTRAVENOUS | Status: DC

## 2022-03-05 MED ORDER — ALTEPLASE 2 MG IJ SOLR: 2.0000 mg | Freq: Once | INTRAMUSCULAR | Status: DC | PRN

## 2022-03-05 MED ORDER — HEPARIN SODIUM (PORCINE) 1000 UNIT/ML IJ SOLN
INTRAMUSCULAR | Status: AC
  Filled 2022-03-05: qty 1

## 2022-03-05 MED ORDER — LIDOCAINE HCL (PF) 1 % IJ SOLN
5.0000 mL | INTRAMUSCULAR | Status: DC | PRN
Start: 2022-03-05 — End: 2022-03-06
  Filled 2022-03-05: qty 5

## 2022-03-05 MED ORDER — PENTAFLUOROPROP-TETRAFLUOROETH EX AERO
1.0000 | INHALATION_SPRAY | CUTANEOUS | Status: DC | PRN
Start: 2022-03-05 — End: 2022-03-06

## 2022-03-05 MED ORDER — DIPHENHYDRAMINE HCL 50 MG/ML IJ SOLN
INTRAMUSCULAR | Status: AC
  Filled 2022-03-05: qty 1

## 2022-03-05 MED ORDER — DIPHENHYDRAMINE HCL 50 MG/ML IJ SOLN
25.0000 mg | Freq: Once | INTRAMUSCULAR | Status: AC
Start: 2022-03-05 — End: 2022-03-05
  Administered 2022-03-05: 25 mg via INTRAVENOUS

## 2022-03-05 MED ORDER — LIDOCAINE-PRILOCAINE 2.5-2.5 % EX CREA: 1.0000 | TOPICAL_CREAM | CUTANEOUS | Status: DC | PRN

## 2022-03-05 MED ORDER — HEPARIN SODIUM (PORCINE) 1000 UNIT/ML DIALYSIS: 1000.0000 [IU] | INTRAMUSCULAR | Status: DC | PRN

## 2022-03-05 SURGICAL SUPPLY — 4 items
GUIDEWIRE AMPLATZ SHORT (WIRE) ×1 IMPLANT
KIT DIALYSIS CATH TRI 30X13 (CATHETERS) ×1 IMPLANT
PACK ANGIOGRAPHY (CUSTOM PROCEDURE TRAY) ×2 IMPLANT
SHEATH PROBE COVER 6X72 (BAG) ×1 IMPLANT

## 2022-03-05 NOTE — Progress Notes (Signed)
Pre HD RN assessment 

## 2022-03-05 NOTE — Progress Notes (Signed)
Post HD RN assessment 

## 2022-03-05 NOTE — TOC Progression Note (Signed)
Transition of Care [facility]) - Progression Note    Patient Details  Name: James Clarke MRN: [id number] Date of Birth: Apr 29, 1941  Transition of Care [facility]) CM/SW Contact  Beverly Sessions, RN Phone Number: 03/05/2022, 10:32 AM  Clinical Narrative:     Plan to initiate  HD today  Patient confirms he has a RW, standard walker, cane and BSC at home.         Expected Discharge Plan and Services                                                 Social Determinants of Health (SDOH) Interventions    Readmission Risk Interventions    02/27/2022    4:22 PM  Readmission Risk Prevention Plan  Transportation Screening Complete  HRI or Home Care Consult Complete  Palliative Care Screening Not Applicable  Medication Review (RN Care Manager) Complete

## 2022-03-05 NOTE — Progress Notes (Signed)
OT Cancellation Note  Patient Details Name: James Clarke MRN: [id number] DOB: 01-27-1941   Cancelled Treatment:    Reason Eval/Treat Not Completed: Patient at procedure or test/ unavailable Upon OT arrival, transport in room to take pt to get HD catheter placed. OT to f/u at next available service date.   Doneta Public 03/05/2022, 12:18 PM

## 2022-03-05 NOTE — Progress Notes (Signed)
PT Cancellation Note  Patient Details Name: James Clarke MRN: [id number] DOB: Dec 09, 1940   Cancelled Treatment:    Reason Eval/Treat Not Completed: Other (comment). Pt with MD at bedside, to re-attempt as able.    [name] PT, DPT 9:15 AM,03/05/22

## 2022-03-05 NOTE — Progress Notes (Signed)
Pt arrived for HD anxious and tearful. Staff answered pt questions and explained HD treatment. Sherlyn Hay, NP present and placed order for Benadryl '25mg'$  IV w/ HD. Treatment started w/ no complications. Pt alert, vss, no c/o, safety maintained. RN at bedside.

## 2022-03-05 NOTE — Consult Note (Signed)
[facility] Vascular Consult Note  MRN : [id number]  James Clarke is a 81 y.o. (01-Mar-1941) male who presents with chief complaint of  Chief Complaint  Patient presents with   Abdominal Pain   Back Pain  .  History of Present Illness:   I am asked to evaluate the patient by Dr. Sheppard Coil.  Patient is an 81 year old gentleman admitted to the hospital on 02/25/2022 with abdominal pain.  At that time he had a known history of chronic renal insufficiency hypertension diabetes as well as hyperlipidemia kidney stones and sleep apnea.  On admission he was noted to have significant worsening of his renal function.  He also had hydronephrosis and was seen by urology.  On 02/27/2022 he underwent laser lithotripsy and stent placement for treatment of his ureteral stones and hydronephrosis.  Unfortunately, over the ensuing past 5 days his renal function is continued to deteriorate and he now requires hemodialysis.  I am asked to evaluate. Current Facility-Administered Medications  Medication Dose Route Frequency Provider Last Rate Last Admin   0.9 %  sodium chloride infusion   Intravenous Continuous Kris Hartmann, NP 10 mL/hr at 03/05/22 0450 Infusion Verify at 03/05/22 0450   [MAR Hold] acetaminophen (TYLENOL) tablet 650 mg  650 mg Oral Q6H PRN Para Skeans, MD       Or   Doug Sou Hold] acetaminophen (TYLENOL) suppository 650 mg  650 mg Rectal Q6H PRN Para Skeans, MD       [MAR Hold] albuterol (PROVENTIL) (2.5 MG/3ML) 0.083% nebulizer solution 2.5 mg  2.5 mg Inhalation Q6H PRN Para Skeans, MD       [MAR Hold] alteplase (CATHFLO ACTIVASE) injection 2 mg  2 mg Intracatheter Once PRN Colon Flattery, NP       [MAR Hold] amLODipine (NORVASC) tablet 10 mg  10 mg Oral Daily Enzo Bi, MD   10 mg at 03/05/22 0947   [MAR Hold] anticoagulant sodium citrate solution 5 mL  5 mL Intracatheter PRN Colon Flattery, NP       [MAR Hold] aspirin EC tablet 81 mg  81 mg Oral Daily Florina Ou V, MD   81 mg at 03/05/22 0948   [MAR Hold] cefTRIAXone (ROCEPHIN) 2 g in sodium chloride 0.9 % 100 mL IVPB  2 g Intravenous Q24H Emeterio Reeve, DO 200 mL/hr at 03/05/22 0450 Infusion Verify at 03/05/22 0450   [MAR Hold] Chlorhexidine Gluconate Cloth 2 % PADS 6 each  6 each Topical Q0600 Colon Flattery, NP   6 each at 03/05/22 0630   [MAR Hold] heparin injection 1,000 Units  1,000 Units Intracatheter PRN Colon Flattery, NP       [MAR Hold] heparin injection 5,000 Units  5,000 Units Subcutaneous Q8H Florina Ou V, MD   5,000 Units at 03/05/22 0606   [MAR Hold] hydrALAZINE (APRESOLINE) injection 10 mg  10 mg Intravenous Q4H PRN Para Skeans, MD       [MAR Hold] hydrALAZINE (APRESOLINE) tablet 50 mg  50 mg Oral Q8H Enzo Bi, MD   50 mg at 03/05/22 0625   [MAR Hold] HYDROcodone-acetaminophen (NORCO/VICODIN) 5-325 MG per tablet 1 tablet  1 tablet Oral Q4H PRN Para Skeans, MD       [facility] Hold] insulin aspart (novoLOG) injection 0-15 Units  0-15 Units Subcutaneous TID WC Para Skeans, MD   8 Units at 03/05/22 1113   [MAR Hold] insulin aspart (novoLOG) injection 10 Units  10 Units Subcutaneous TID WC  Emeterio Reeve, DO   10 Units at 03/04/22 1729   [MAR Hold] insulin detemir (LEVEMIR) injection 30 Units  30 Units Subcutaneous Daily Emeterio Reeve, DO   30 Units at 03/04/22 1118   [MAR Hold] lidocaine (PF) (XYLOCAINE) 1 % injection 5 mL  5 mL Intradermal PRN Colon Flattery, NP       [MAR Hold] lidocaine-prilocaine (EMLA) cream 1 Application  1 Application Topical PRN Colon Flattery, NP       [MAR Hold] loperamide (IMODIUM) capsule 4 mg  4 mg Oral PRN Emeterio Reeve, DO   4 mg at 03/05/22 0949   [MAR Hold] metoprolol succinate (TOPROL-XL) 24 hr tablet 50 mg  50 mg Oral Daily Para Skeans, MD   50 mg at 03/05/22 0947   [MAR Hold] nystatin (MYCOSTATIN/NYSTOP) topical powder   Topical BID Emeterio Reeve, DO   Given at 03/05/22 0944   [MAR Hold]  pentafluoroprop-tetrafluoroeth (GEBAUERS) aerosol 1 Application  1 Application Topical PRN Colon Flattery, NP       [MAR Hold] polyethylene glycol (MIRALAX / GLYCOLAX) packet 34 g  34 g Oral BID Enzo Bi, MD   34 g at 03/01/22 0831   [MAR Hold] sodium chloride flush (NS) 0.9 % injection 3 mL  3 mL Intravenous Q12H Florina Ou V, MD   3 mL at 03/04/22 2143   [MAR Hold] tamsulosin (FLOMAX) capsule 0.4 mg  0.4 mg Oral Daily Stoioff, Scott C, MD   0.4 mg at 03/05/22 5035    Past Medical History:  Diagnosis Date   Asthma    Bell's palsy    SEVERAL TIMES   Bell's palsy    Cancer ([place])    SKIN   Diabetes mellitus without complication ([place])    Dyspnea    DOE   History of kidney stones    Hyperlipemia    Hypertension    Kidney stone    Neuropathy    Obesity    Sleep apnea    C PAP    Past Surgical History:  Procedure Laterality Date   APPENDECTOMY     CATARACT EXTRACTION W/PHACO Left 05/20/2017   Procedure: CATARACT EXTRACTION PHACO AND INTRAOCULAR LENS PLACEMENT ([place]);  Surgeon: Birder Robson, MD;  Location: [facility];  Service: Ophthalmology;  Laterality: Left;  [place] 00:32.0 AP% 15.5 CDE 4.97 Fluid Pack Lot # [id number]   CATARACT EXTRACTION W/PHACO Right 06/10/2017   Procedure: CATARACT EXTRACTION PHACO AND INTRAOCULAR LENS PLACEMENT ([facility]);  Surgeon: Birder Robson, MD;  Location: [facility];  Service: Ophthalmology;  Laterality: Right;  [place]  00:51 AP% 15.4 CDE 7.95 Fluid pack lot # [id number]   CHOLECYSTECTOMY     CYSTOSCOPY W/ RETROGRADES Right 08/13/2019   Procedure: CYSTOSCOPY WITH RETROGRADE PYELOGRAM;  Surgeon: Billey Co, MD;  Location: [facility];  Service: Urology;  Laterality: Right;   CYSTOSCOPY WITH STENT PLACEMENT Right 07/26/2019   Procedure: CYSTOSCOPY WITH STENT PLACEMENT;  Surgeon: Lucas Mallow, MD;  Location: [facility];  Service: Urology;  Laterality: Right;   CYSTOSCOPY/URETEROSCOPY/HOLMIUM LASER/STENT PLACEMENT Right 08/13/2019   Procedure:  CYSTOSCOPY/URETEROSCOPY/HOLMIUM LASER/STENT EXCHANGE;  Surgeon: Billey Co, MD;  Location: [facility];  Service: Urology;  Laterality: Right;   CYSTOSCOPY/URETEROSCOPY/HOLMIUM LASER/STENT PLACEMENT Left 02/27/2022   Procedure: CYSTOSCOPY/URETEROSCOPY/HOLMIUM LASER/STENT PLACEMENT;  Surgeon: Abbie Sons, MD;  Location: [facility];  Service: Urology;  Laterality: Left;   FRACTURE SURGERY     WRIST   HAND SURGERY     HERNIA REPAIR     LEFT HEART  CATH AND CORONARY ANGIOGRAPHY N/A 12/31/2021   Procedure: LEFT HEART CATH AND CORONARY ANGIOGRAPHY;  Surgeon: Corey Skains, MD;  Location: [place] [facility];  Service: Cardiovascular;  Laterality: N/A;   SHOULDER ARTHROSCOPY WITH SUBACROMIAL DECOMPRESSION AND OPEN ROTATOR C Right 09/11/2020   Procedure: Right shoulder arthroscopic rotator cuff repair vs Regeneten patch application, subacromial decompression, and biceps tenodesis - Reche Dixon to Assist;  Surgeon: Leim Fabry, MD;  Location: [facility];  Service: Orthopedics;  Laterality: Right;   TONSILLECTOMY     URETEROSCOPY WITH HOLMIUM LASER LITHOTRIPSY Left 02/27/2022   Procedure: URETEROSCOPY WITH HOLMIUM LASER LITHOTRIPSY;  Surgeon: Abbie Sons, MD;  Location: [facility];  Service: Urology;  Laterality: Left;    Social History Social History   Tobacco Use   Smoking status: Never    Passive exposure: Never   Smokeless tobacco: Never  Vaping Use   Vaping Use: Never used  Substance Use Topics   Alcohol use: No   Drug use: Not Currently    Family History Family History  Problem Relation Age of Onset   Emphysema Mother    COPD Mother    Heart disease Mother    Brain cancer Father     Allergies  Allergen Reactions   Codeine Nausea And Vomiting   Doxycycline    Erythromycin Rash     REVIEW OF SYSTEMS (Negative unless checked)  Constitutional: '[]'$ Weight loss  '[]'$ Fever  '[]'$ Chills Cardiac: '[]'$ Chest pain   '[]'$ Chest pressure   '[]'$ Palpitations   '[]'$ Shortness of breath at rest    '[]'$ Shortness of breath with exertion. Vascular:  '[]'$ Pain in legs with walking   '[]'$ Pain in legs at rest  '[]'$ History of DVT   '[]'$ Phlebitis   '[]'$ Swelling in legs   '[]'$ Varicose veins   '[]'$ Non-healing ulcers Pulmonary:   '[]'$ Uses home oxygen   '[]'$ Productive cough   '[]'$ Hemoptysis   '[]'$ Wheeze  '[]'$ COPD   '[]'$ Asthma Neurologic:  '[]'$ Dizziness  '[]'$ Blackouts   '[]'$ Seizures   '[]'$ History of stroke   '[]'$ History of TIA  '[]'$ Aphasia   '[]'$ Temporary blindness   '[]'$ Dysphagia   '[]'$ Weakness or numbness in arms   '[]'$ Weakness or numbness in legs Musculoskeletal:  '[]'$ Arthritis   '[]'$ Joint swelling   '[]'$ Joint pain   '[]'$ Low back pain Hematologic:  '[]'$ Easy bruising    '[]'$ Hypercoagulable state   '[]'$ Anemic  '[]'$ Hepatitis Gastrointestinal:  '[]'$ Blood in stool   '[]'$ Vomiting blood  '[]'$ Gastroesophageal reflux/heartburn   '[]'$ Difficulty swallowing. Genitourinary:  '[x]'$ Chronic kidney disease   '[]'$ Difficult urination  '[]'$ Frequent urination  '[]'$ Burning with urination   '[]'$ Blood in urine Skin:  '[]'$ Rashes   '[]'$ Ulcers   Psychological:  '[]'$ History of anxiety   '[]'$  History of major depression.    Physical Examination  Vitals:   03/05/22 0753 03/05/22 0939 03/05/22 0941 03/05/22 1226  BP: (!) 160/66  (!) 147/62 (!) 146/70  Pulse: 87 83 80 75  Resp: '17  16 17  '$ Temp: 98.2 F (36.8 C)   97.9 F (36.6 C)  TempSrc: Oral   Oral  SpO2: 97% 97% 96% 96%  Weight:      Height:       Body mass index is 45.42 kg/m.  Head: [place]/AT, No temporalis wasting.  Ear/Nose/Throat: Nares w/o erythema or drainage, oropharynx mucus membranes moist Eyes: PERRLA, Sclera nonicteric.  Neck: Supple,   No JVD.  Pulmonary:  Breath sounds equal bilaterally, no use of accessory muscles.  Cardiac: RRR, normal S1, S2,  Vascular: 2+ radial and femoral pulses bilaterally 3-4+ edema throughout Gastrointestinal: soft, non-tender, non-distended.  Musculoskeletal:  Moves all extremities.  No deformity or atrophy. No edema. Neurologic: 5/5 motor sensory, face appears symmetric speech fluent  Psychiatric: Appropriate  mood for situation. Dermatologic: No rashes or ulcers noted.  No cellulitis or open wounds. Lymph : No Cervical,  or Inguinal lymphadenopathy.      CBC Lab Results  Component Value Date   WBC 9.0 03/04/2022   HGB 10.3 (L) 03/04/2022   HCT 32.8 (L) 03/04/2022   MCV 89.9 03/04/2022   PLT 316 03/04/2022    BMET    Component Value Date/Time   NA 142 03/05/2022 0812   NA 137 11/27/2013 0412   K 3.9 03/05/2022 0812   K 3.5 11/27/2013 0412   CL 114 (H) 03/05/2022 0812   CL 105 11/27/2013 0412   CO2 18 (L) 03/05/2022 0812   CO2 25 11/27/2013 0412   GLUCOSE 179 (H) 03/05/2022 0812   GLUCOSE 98 11/27/2013 0412   BUN 78 (H) 03/05/2022 0812   BUN 42 (H) 11/27/2013 0412   CREATININE 4.53 (H) 03/05/2022 0812   CREATININE 2.14 (H) 11/27/2013 0412   CALCIUM 8.3 (L) 03/05/2022 0812   CALCIUM 7.6 (L) 11/27/2013 0412   GFRNONAA 12 (L) 03/05/2022 0812   GFRNONAA 30 (L) 11/27/2013 0412   GFRAA 30 (L) 03/13/2020 1531   GFRAA 34 (L) 11/27/2013 0412   Estimated Creatinine Clearance: 18.3 mL/min (A) (by C-G formula based on SCr of 4.53 mg/dL (H)).  COAG Lab Results  Component Value Date   INR 1.0 09/08/2020      Assessment/Plan Acute on chronic renal insufficiency: The patient has reached a point that he requires initiation of hemodialysis.  Therefore a temporary catheter will be placed as there is still chance he could recover.  Risk and benefits of been reviewed all questions answered patient has agreed to proceed.  Diabetes mellitus: Continue hypoglycemic medications as already ordered, these medications have been reviewed and there are no changes at this time.  Hgb A1C to be monitored as already arranged by primary service     Hypertension: Continue antihypertensive medications as already ordered, these medications have been reviewed and there are no changes at this time.     Hortencia Pilar, MD  03/05/2022 12:30 PM

## 2022-03-05 NOTE — Progress Notes (Addendum)
[facility]  PROGRESS NOTE   Subjective:   Patient seen sitting at side of bed, wife at bedside Alert and oriented however anxious for scheduled procedure Patient and wife have multiple questions regarding scheduled procedure, answered to the best of my ability. Currently n.p.o.  Creatinine 4.53 Urine output 1.3 L in preceding 24 hours  Objective:  Vital signs: Blood pressure (!) 146/70, pulse 82, temperature 97.9 F (36.6 C), temperature source Oral, resp. rate 17, height '5\' 10"'$  (1.778 m), weight (!) 143.6 kg, SpO2 100 %.  Intake/Output Summary (Last 24 hours) at 03/05/2022 1357 Last data filed at 03/05/2022 1100 Gross per 24 hour  Intake 135.42 ml  Output 701 ml  Net -565.58 ml    Filed Weights   03/02/22 0500 03/03/22 0525 03/05/22 0600  Weight: (!) 145.6 kg (!) 145.6 kg (!) 143.6 kg     Physical Exam: General:  No acute distress  Head:  Normocephalic, atraumatic. Moist oral mucosal membranes  Eyes:  Anicteric  Lungs:   Clear to auscultation, normal effort  Heart:  S1S2 no rubs  Abdomen:   Soft, nontender, bowel sounds present  Extremities:  1+ peripheral edema.  Neurologic:  Awake, alert, following commands  Skin:  No lesions  Access: None    Basic Metabolic Panel: Recent Labs  Lab 02/27/22 0410 02/28/22 0402 03/01/22 0353 03/02/22 0600 03/03/22 0515 03/04/22 0400 03/05/22 0812  NA 139 138 139 139 141 143 142  K 4.0 4.5 4.5 4.2 3.9 3.6 3.9  CL 110 108 110 110 113* 115* 114*  CO2 21* 21* 20* 20* 21* 20* 18*  GLUCOSE 230* 226* 141* 199* 154* 131* 179*  BUN 83* 89* 93* 93* 90* 85* 78*  CREATININE 5.87* 5.91* 5.74* 5.50* 5.14* 4.81* 4.53*  CALCIUM 8.0* 8.0* 7.9* 7.9* 8.0* 8.2* 8.3*  MG 2.3 2.3 2.2 2.2 2.1  --   --      CBC: Recent Labs  Lab 02/27/22 0410 02/28/22 0402 03/01/22 0353 03/04/22 0400  WBC 9.5 12.8* 12.2* 9.0  HGB 10.0* 10.4* 10.7* 10.3*  HCT 31.4* 32.9* 34.1* 32.8*  MCV 89.5 90.1 90.0 89.9  PLT 278 299 226 316       Urinalysis: No results for input(s): "COLORURINE", "LABSPEC", "PHURINE", "GLUCOSEU", "HGBUR", "BILIRUBINUR", "KETONESUR", "PROTEINUR", "UROBILINOGEN", "NITRITE", "LEUKOCYTESUR" in the last 72 hours.  Invalid input(s): "APPERANCEUR"    Imaging: PERIPHERAL VASCULAR CATHETERIZATION  Result Date: 03/05/2022 See surgical note for result.    Medications:    anticoagulant sodium citrate     cefTRIAXone (ROCEPHIN)  IV 200 mL/hr at 03/05/22 0450    amLODipine  10 mg Oral Daily   aspirin EC  81 mg Oral Daily   Chlorhexidine Gluconate Cloth  6 each Topical Q0600   heparin  5,000 Units Subcutaneous Q8H   hydrALAZINE  50 mg Oral Q8H   insulin aspart  0-15 Units Subcutaneous TID WC   insulin aspart  10 Units Subcutaneous TID WC   insulin detemir  30 Units Subcutaneous Daily   metoprolol succinate  50 mg Oral Daily   nystatin   Topical BID   polyethylene glycol  34 g Oral BID   sodium chloride flush  3 mL Intravenous Q12H   tamsulosin  0.4 mg Oral Daily    Assessment/ Plan:     Principal Problem:   Acute renal failure superimposed on stage 4 chronic kidney disease (HCC) Active Problems:   Type 2 diabetes mellitus with renal manifestations ([place])   Sleep apnea  Hypertension   Bilateral leg edema   Left ureteral calculus   Chronic diastolic CHF (congestive heart failure) (HCC)   Hydronephrosis of left kidney   UTI (urinary tract infection)   Skin irritation   Diarrhea  James Clarke is a 81 y.o.  male with past medical conditions including Bell's palsy, diabetes, hypertension, hyperlipidemia, sleep apnea on CPAP, and chronic kidney disease stage IV.  Patient presents to the emergency department at the advice of his nephrologist for abnormal renal function.  Patient has been admitted for Bilateral flank pain [R10.9] Suprapubic abdominal pain [R10.2] Abdominal pain [R10.9] Acute renal failure superimposed on chronic kidney disease, unspecified CKD stage, unspecified  acute renal failure type ([place]) [N17.9, N18.9] AKI (acute kidney injury) ([place]) [N17.9]   #1: Acute kidney injury on CKD stage IV: Patient has a baseline creatinine of 3.3.  Patient is now at admitted with obstructive uropathy secondary to nephrolithiasis.  Patient is status post ureteroscopy.  Although patient continues to have adequate urine output, creatinine and BUN remain elevated.  We will continue with plan to initiate temporary hemodialysis.  Vascular surgery will place HD temp cath today.  We will provide dialysis for 3 consecutive days and evaluate renal recovery after that time. Benadryl ordered for anxiety.    #2: Hypertension: Continue present antihypertensive treatment.  Blood pressure 146/70   #3: Diabetes mellitus type II with chronic kidney disease   glucose remains elevated at times.  Primary team has managed with sliding scale insulin.   #4: Anemia with chronic kidney disease: Hemoglobin  10.3.  We will continue to monitor.   #5: Congestive heart failure: We will continue 2 g salt restricted diet with 1 L fluid restriction orally.  Diuretics remain held however lower extremity edema present.  Will manage fluid levels with dialysis.      LOS: [facility] 8/15/20231:57 PM

## 2022-03-05 NOTE — Plan of Care (Signed)
Pt AAOx4, no pain. Plan for portacath placement and voiding trial today.  Wife updated on today's plan. VS are stable. Bed in lowest position, call light within reach. Will continue to monitor.

## 2022-03-05 NOTE — Progress Notes (Signed)
Pt HD complete w/ no complications. Report to primary RN. Start 1528 End 1730 0 fluid removed 24L BVP 142.2kg bed weight post HD Benadryl '25mg'$  IV w/ HD

## 2022-03-05 NOTE — Progress Notes (Signed)
PROGRESS NOTE    James Clarke   JQB:[id number] DOB: 05-25-1941  DOA: 02/25/2022 Date of Service: 03/05/22 PCP: Sofie Hartigan, MD     Brief Narrative / Hospital Course:  James Clarke is a 81 y.o. male with past medical history of chronic kidney disease 4, hypertension, diabetes mellitus type 2, chronic CHF and bilateral leg edema (Echo 218 2018 may shows normal left ventricular cavity size systolic function normal ejection fraction between 25 to 55%), kidney stones, obstructive sleep apnea who presented 02/25/2022 with abdominal pain evaluated in the urgent care initially and was referred to the emergency room after being told that he has acute kidney injury. 08/07: BUN of 80 creatinine of 5.61 (baseline approx 2.5), GFR of 10, WBC 13.3, UA more than 300 protein and small hemoglobin with 0-5 RBCs and 0-5 WBCs. CT Abd/Pelv: stone at the left UVJ with accompanying hydronephrosis.  Renal US: unremarkable. Admitted for ARF on CKD3 suspected prerenal d/t obstructing 2-3 mm calculi within the distal left ureter, and dehydration from decreased po intake plus home lasix and Valsartan use. DVT study given edema but more likely amlodipine effect and hypoalbuminemia.  08/08: seen by urology and nephrology. Echo done: LVEF 55-60 w/ G2 DD.  08/09: Patient underwent left ureteroscopy  08/10: Cr 5.91, UA abnormal, started Rocephin for UTI pending cultures, will repeat urine lytes to confirm FeNa --> concern for intrinsic causes.  Nephrology recs: No need for dialysis at the moment. 08/11: Minimal improvement in Cr 5.74.  No growth on urine culture, continuing current IV antibiotics. Will recollect urine.  Patient also complaining of some discharge/irritation around urethra/glans, treating for yeast infection 08/12: Cr 5.5.  08/13: Cr 5.14.  08/14: Cr 4.8. Still pending UCx result from 08/11 recollection. Nephrology team is planning for temporary dialysis tomorrow. Pt reports continued loose stool w/  episodes of stool incontinence, no fever, GI PCR negative. Foley d/c tomorrow for voiding trial per urology. Consult vascular surgery for placement of temp cath tomorrow for dialysis which should hopefully also help fluid overload since lasix unable to be provided d/t renal dysfunction.  08/15: temporary dialysis catheter placed. Plan initiate HD for 3 consecutive days. Plan remove Foley in AM for voiding trial.   Appreciate specialist recommendations: Urology - goals for any further procedures, UCX showed no growth and repeat urine culture results are pending but question inflammatory response/request recommendations for continued antibiotics     Consultants:  Urology Nephrology   Procedures: Echo L ureteroscopy 02/27/22    Subjective: Patient reports feeling short winded about the same past few days, no chest pain, (+)LE edema.      ASSESSMENT & PLAN:   Principal Problem:   Acute renal failure superimposed on stage 4 chronic kidney disease (HCC) Active Problems:   Type 2 diabetes mellitus with renal manifestations (HCC)   Sleep apnea   Hypertension   Bilateral leg edema   Left ureteral calculus   Chronic diastolic CHF (congestive heart failure) (HCC)   Hydronephrosis of left kidney   UTI (urinary tract infection)   Skin irritation   Diarrhea  Acute renal failure superimposed on stage 4 chronic kidney disease (HCC) Cr 5.61 on presentation.  Baseline around 2.5. AKI due to obstruction, from obstructing 2-3 mm calculi within the distal left ureter, and/or dehydration from home lasix and Valsartan use. Nephrology and urology following Nephrology planning for dialysis tomorrow  Serial BMP  Caution w/ IV fluids to avoid CHF exacerbation, goal that HD can improve fluid status  Treating for UTI as above  Type 2 diabetes mellitus with renal manifestations (HCC) A1c 6.4, well controlled.   Hyperglycemia due to not resumed on home insulin Plan: resume home Levemir at 25u  daily add mealtime 8u TID  Sleep apnea CPAP per home setting.  Hypertension hold home lasix and Valsartan due to AKI cont home Toprol add hydralazine 50 mg Q8h due to elevated BP  Bilateral leg edema Not currently edematous. hold home lasix 2/2 AKI PLanning dialysis hopefully will improve edema  Chronic diastolic CHF (congestive heart failure) (HCC) not in acute exacerbation hold home lasix 2/2 AKI Echo done this admission Planning for dialysis may help fluid status he appears slightly overloaded   Hydronephrosis of left kidney Obstructing 2-3 mm calculi within the distal left ureter just proximal to the left ureterovesicular junction resulting in mild left hydronephrosis and moderate perinephric stranding. urology L ureteroscopy 08/09  Left ureteral calculus Urology: s/p L ureteroscopy 08/09  UTI (urinary tract infection) Rocephin initiated 02/28/2022, at 1 day postop left ureteroscopy Cultures pending --> no growth Urine recollected Urology following, may be inflammatory effect?  Skin irritation Glans penis / urethral meatus Suspect mild yeast infection versus irritation due to Foley catheter, recent instrumentation Diflucan ordered  Keep groin/penis dry as able   Diarrhea Seems most likely d/t antibiotics Seek input from urology re: need for continuation for abx for now awaiting repeat UCx vs if inflammatory effect on previous UA and can d/c  GI PCR negative Improved w/ loperamide      DVT prophylaxis: heparin Code Status: FULL Family Communication: Wife at bedside on rounds Disposition Plan / TOC needs: none at this time  Barriers to discharge / significant pending items: dialysis x3 days, anticipate will be here until at least 8/18-8/19           Objective: Vitals:   03/05/22 0939 03/05/22 0941 03/05/22 1226 03/05/22 1255  BP:  (!) 147/62 (!) 146/70   Pulse: 83 80 75 82  Resp:  16 17   Temp:   97.9 F (36.6 C)   TempSrc:   Oral   SpO2:  97% 96% 96% 100%  Weight:      Height:        Intake/Output Summary (Last 24 hours) at 03/05/2022 1452 Last data filed at 03/05/2022 1100 Gross per 24 hour  Intake 135.42 ml  Output 701 ml  Net -565.58 ml   Filed Weights   03/02/22 0500 03/03/22 0525 03/05/22 0600  Weight: (!) 145.6 kg (!) 145.6 kg (!) 143.6 kg    Examination:  Constitutional:  VS as above General Appearance: alert, well-nourished, NAD Ears, Nose, Mouth, Throat: Normal appearance Neck: No masses, trachea midline Respiratory: Normal respiratory effort Breath sounds faint rales vs atelectasis Cardiovascular: S1/S2 normal, no murmur/rub/gallop auscultated +1 lower extremity edema Gastrointestinal: Nontender, no masses Musculoskeletal:  No clubbing/cyanosis of digits Neurological: No cranial nerve deficit on limited exam Psychiatric: Normal judgment/insight Normal mood and affect       Scheduled Medications:   amLODipine  10 mg Oral Daily   aspirin EC  81 mg Oral Daily   Chlorhexidine Gluconate Cloth  6 each Topical Q0600   heparin  5,000 Units Subcutaneous Q8H   hydrALAZINE  50 mg Oral Q8H   insulin aspart  0-15 Units Subcutaneous TID WC   insulin aspart  10 Units Subcutaneous TID WC   insulin detemir  30 Units Subcutaneous Daily   metoprolol succinate  50 mg Oral Daily   nystatin  Topical BID   polyethylene glycol  34 g Oral BID   sodium chloride flush  3 mL Intravenous Q12H   tamsulosin  0.4 mg Oral Daily    Continuous Infusions:  anticoagulant sodium citrate     cefTRIAXone (ROCEPHIN)  IV 2 g (03/05/22 1358)    PRN Medications:  acetaminophen **OR** acetaminophen, albuterol, alteplase, anticoagulant sodium citrate, heparin, hydrALAZINE, HYDROcodone-acetaminophen, lidocaine (PF), lidocaine-prilocaine, loperamide, pentafluoroprop-tetrafluoroeth  Antimicrobials:  Anti-infectives (From admission, onward)    Start     Dose/Rate Route Frequency Ordered Stop   03/01/22 1430   fluconazole (DIFLUCAN) tablet 150 mg        150 mg Oral  Once 03/01/22 1339 03/01/22 1604   02/28/22 1300  cefTRIAXone (ROCEPHIN) 2 g in sodium chloride 0.9 % 100 mL IVPB        2 g 200 mL/hr over 30 Minutes Intravenous Every 24 hours 02/28/22 1208         Data Reviewed: I have personally reviewed following labs and imaging studies  CBC: Recent Labs  Lab 02/27/22 0410 02/28/22 0402 03/01/22 0353 03/04/22 0400  WBC 9.5 12.8* 12.2* 9.0  HGB 10.0* 10.4* 10.7* 10.3*  HCT 31.4* 32.9* 34.1* 32.8*  MCV 89.5 90.1 90.0 89.9  PLT 278 299 226 161   Basic Metabolic Panel: Recent Labs  Lab 02/27/22 0410 02/28/22 0402 03/01/22 0353 03/02/22 0600 03/03/22 0515 03/04/22 0400 03/05/22 0812  NA 139 138 139 139 141 143 142  K 4.0 4.5 4.5 4.2 3.9 3.6 3.9  CL 110 108 110 110 113* 115* 114*  CO2 21* 21* 20* 20* 21* 20* 18*  GLUCOSE 230* 226* 141* 199* 154* 131* 179*  BUN 83* 89* 93* 93* 90* 85* 78*  CREATININE 5.87* 5.91* 5.74* 5.50* 5.14* 4.81* 4.53*  CALCIUM 8.0* 8.0* 7.9* 7.9* 8.0* 8.2* 8.3*  MG 2.3 2.3 2.2 2.2 2.1  --   --    GFR: Estimated Creatinine Clearance: 18.3 mL/min (A) (by C-G formula based on SCr of 4.53 mg/dL (H)). Liver Function Tests: No results for input(s): "AST", "ALT", "ALKPHOS", "BILITOT", "PROT", "ALBUMIN" in the last 168 hours.  No results for input(s): "LIPASE", "AMYLASE" in the last 168 hours. No results for input(s): "AMMONIA" in the last 168 hours. Coagulation Profile: No results for input(s): "INR", "PROTIME" in the last 168 hours. Cardiac Enzymes: No results for input(s): "CKTOTAL", "CKMB", "CKMBINDEX", "TROPONINI" in the last 168 hours. BNP (last 3 results) No results for input(s): "PROBNP" in the last 8760 hours. HbA1C: No results for input(s): "HGBA1C" in the last 72 hours.  CBG: Recent Labs  Lab 03/04/22 1228 03/04/22 1620 03/04/22 2130 03/05/22 0754 03/05/22 1111  GLUCAP 254* 240* 102* 175* 263*   Lipid Profile: No results for  input(s): "CHOL", "HDL", "LDLCALC", "TRIG", "CHOLHDL", "LDLDIRECT" in the last 72 hours. Thyroid Function Tests: No results for input(s): "TSH", "T4TOTAL", "FREET4", "T3FREE", "THYROIDAB" in the last 72 hours.  Anemia Panel: No results for input(s): "VITAMINB12", "FOLATE", "FERRITIN", "TIBC", "IRON", "RETICCTPCT" in the last 72 hours. Urine analysis:    Component Value Date/Time   COLORURINE YELLOW (A) 02/28/2022 0958   APPEARANCEUR CLOUDY (A) 02/28/2022 0958   APPEARANCEUR Hazy (A) 08/19/2019 1113   LABSPEC 1.013 02/28/2022 0958   LABSPEC 1.008 11/27/2013 0810   PHURINE 5.0 02/28/2022 0958   GLUCOSEU >=500 (A) 02/28/2022 0958   GLUCOSEU Negative 11/27/2013 0810   HGBUR LARGE (A) 02/28/2022 0958   BILIRUBINUR NEGATIVE 02/28/2022 0958   BILIRUBINUR Negative 08/19/2019 1113   BILIRUBINUR Negative  11/27/2013 0810   KETONESUR 5 (A) 02/28/2022 0958   PROTEINUR >=300 (A) 02/28/2022 0958   NITRITE NEGATIVE 02/28/2022 0958   LEUKOCYTESUR MODERATE (A) 02/28/2022 0958   LEUKOCYTESUR Negative 11/27/2013 0810   Sepsis Labs: '@LABRCNTIP'$ (procalcitonin:4,lacticidven:4)  Recent Results (from the past 240 hour(s))  Urine Culture     Status: None   Collection Time: 02/28/22 12:50 PM   Specimen: Urine, Catheterized  Result Value Ref Range Status   Specimen Description   Final    URINE, CATHETERIZED Performed at [facility], [address], [place], [place] [address]    Special Requests   Final    NONE Performed at [facility], [address], [place], [place] [address]    Culture   Final    NO GROWTH Performed at [facility], [place] [address] [place], [place] [address]    Report Status 03/01/2022 FINAL  Final  Gastrointestinal Panel by PCR , Stool     Status: None   Collection Time: 03/04/22  1:18 PM   Specimen: Stool  Result Value Ref Range Status   Campylobacter species NOT DETECTED NOT DETECTED Final   Plesimonas shigelloides NOT DETECTED NOT DETECTED  Final   Salmonella species NOT DETECTED NOT DETECTED Final   Yersinia enterocolitica NOT DETECTED NOT DETECTED Final   Vibrio species NOT DETECTED NOT DETECTED Final   Vibrio cholerae NOT DETECTED NOT DETECTED Final   Enteroaggregative E coli (EAEC) NOT DETECTED NOT DETECTED Final   Enteropathogenic E coli (EPEC) NOT DETECTED NOT DETECTED Final   Enterotoxigenic E coli (ETEC) NOT DETECTED NOT DETECTED Final   Shiga like toxin producing E coli (STEC) NOT DETECTED NOT DETECTED Final   Shigella/Enteroinvasive E coli (EIEC) NOT DETECTED NOT DETECTED Final   Cryptosporidium NOT DETECTED NOT DETECTED Final   Cyclospora cayetanensis NOT DETECTED NOT DETECTED Final   Entamoeba histolytica NOT DETECTED NOT DETECTED Final   Giardia lamblia NOT DETECTED NOT DETECTED Final   Adenovirus F40/41 NOT DETECTED NOT DETECTED Final   Astrovirus NOT DETECTED NOT DETECTED Final   Norovirus GI/GII NOT DETECTED NOT DETECTED Final   Rotavirus A NOT DETECTED NOT DETECTED Final   Sapovirus (I, II, IV, and V) NOT DETECTED NOT DETECTED Final    Comment: Performed at [facility], [address], [place], [place] [address]         Radiology Studies last 96 hours: PERIPHERAL VASCULAR CATHETERIZATION  Result Date: 03/05/2022 See surgical note for result.           LOS: 7 days     Emeterio Reeve, DO [facility] 03/05/2022, 2:52 PM   Staff may message me via secure chat in [place]  but this may not receive immediate response,  please page for urgent matters!  If 7PM-7AM, please contact night-coverage [email or web address]  Dictation software was used to generate the above note. Typos may occur and escape review, as with typed/written notes. Please contact Dr Sheppard Coil directly for clarity if needed.

## 2022-03-05 NOTE — Op Note (Signed)
  OPERATIVE NOTE   PROCEDURE: Insertion of temporary dialysis catheter catheter right femoral approach.  PRE-OPERATIVE DIAGNOSIS: Acute on chronic renal insufficiency now requiring heated low dialysis  POST-OPERATIVE DIAGNOSIS: Same  SURGEON: Katha Cabal M.D.  ANESTHESIA: 1% lidocaine local infiltration  ESTIMATED BLOOD LOSS: Minimal cc  INDICATIONS:   James Clarke is a 81 y.o. male who presents with worsening of his renal function.  At baseline he was a stage III but is now progressed to stage V with anasarca and increasing shortness of breath.  Hemodialysis has therefore been recommended.  Risks and benefits for catheter placement were reviewed patient agrees to proceed.  DESCRIPTION: After obtaining full informed written consent, the patient was positioned supine. The right groin was prepped and draped in a sterile fashion. Ultrasound was placed in a sterile sleeve. Ultrasound was utilized to identify the right common femoral vein which is noted to be echolucent and compressible indicating patency. Images recorded for the permanent record. Under real-time visualization a Seldinger needle is inserted into the vein and the guidewires advanced without difficulty. Small counterincision was made at the wire insertion site. Dilator is passed over the wire and the temporary dialysis catheter catheter is fed over the wire without difficulty.  All lumens aspirate and flush easily and are packed with heparin saline. Catheter secured to the skin of the right thigh with 2-0 silk. A sterile dressing is applied with Biopatch.  COMPLICATIONS: None  CONDITION: Unchanged  Hortencia Pilar Office:  [phone] 03/05/2022, 2:05 PM

## 2022-03-06 DIAGNOSIS — R1030 Lower abdominal pain, unspecified: Secondary | ICD-10-CM | POA: Diagnosis not present

## 2022-03-06 DIAGNOSIS — I5032 Chronic diastolic (congestive) heart failure: Secondary | ICD-10-CM | POA: Diagnosis not present

## 2022-03-06 DIAGNOSIS — N179 Acute kidney failure, unspecified: Secondary | ICD-10-CM | POA: Diagnosis not present

## 2022-03-06 DIAGNOSIS — R600 Localized edema: Secondary | ICD-10-CM | POA: Diagnosis not present

## 2022-03-06 LAB — CALCULI, WITH PHOTOGRAPH (CLINICAL LAB)
Uric Acid Calculi: 100 %
Weight Calculi: 14 mg

## 2022-03-06 LAB — BASIC METABOLIC PANEL
Anion gap: 7 (ref 5–15)
BUN: 57 mg/dL — ABNORMAL HIGH (ref 8–23)
CO2: 24 mmol/L (ref 22–32)
Calcium: 8 mg/dL — ABNORMAL LOW (ref 8.9–10.3)
Chloride: 110 mmol/L (ref 98–111)
Creatinine, Ser: 3.61 mg/dL — ABNORMAL HIGH (ref 0.61–1.24)
GFR, Estimated: 16 mL/min — ABNORMAL LOW (ref >60)
Glucose, Bld: 217 mg/dL — ABNORMAL HIGH (ref 70–99)
Potassium: 3.9 mmol/L (ref 3.5–5.1)
Sodium: 141 mmol/L (ref 135–145)

## 2022-03-06 LAB — GLUCOSE, CAPILLARY
Glucose-Capillary: 271 mg/dL — ABNORMAL HIGH (ref 70–99)
Glucose-Capillary: 155 mg/dL — ABNORMAL HIGH (ref 70–99)
Glucose-Capillary: 174 mg/dL — ABNORMAL HIGH (ref 70–99)
Glucose-Capillary: 126 mg/dL — ABNORMAL HIGH (ref 70–99)

## 2022-03-06 MED ORDER — HEPARIN SODIUM (PORCINE) 1000 UNIT/ML IJ SOLN
INTRAMUSCULAR | Status: AC
  Administered 2022-03-06: 1000 [IU]
  Filled 2022-03-06: qty 10

## 2022-03-06 MED ORDER — ONDANSETRON HCL 4 MG/2ML IJ SOLN
4.0000 mg | Freq: Once | INTRAMUSCULAR | Status: AC
  Administered 2022-03-06: 4 mg via INTRAVENOUS
  Filled 2022-03-06: qty 2

## 2022-03-06 MED ORDER — DIPHENHYDRAMINE HCL 50 MG/ML IJ SOLN: 25.0000 mg | Freq: Once | INTRAMUSCULAR | Status: DC

## 2022-03-06 NOTE — Progress Notes (Signed)
Hd tx of 2.5hrs completed. 0L uf removed. 37.4L total vol processed. No complications noted. Report given to [facility], rn Total uf removed: 65m Post hd weight: 142.1kgs Post hd v/s: 98.5 143/66(89) 77 18 96%

## 2022-03-06 NOTE — Progress Notes (Signed)
Wife called and wanted to recheck patient's blood sugar, this nurse came to check on patient who was asleep, informed her that blood sugar was just checked an hour ago, per wife, his blood sugar drops to 40's  out of nowhere. Assessed patient and he denies any symptoms of hypoglycemia at the moment, wife has been insisting to husband that he should drink sprite and he has been refusing thus the request to recheck blood glucose. Patient is asymptomatic of any hypoglycemia symptoms, informed them that we will recheck if he feels any symptoms. Patient put CPAP back on and went back to sleep after urinating

## 2022-03-06 NOTE — Progress Notes (Signed)
OT Cancellation Note  Patient Details Name: James Clarke MRN: [id number] DOB: [birth year]   Cancelled Treatment:    Reason Eval/Treat Not Completed: Other (comment) Pt off the floor receiving HD. OT to re-attempt as able.  Doneta Public 03/06/2022, 11:23 AM

## 2022-03-06 NOTE — Progress Notes (Signed)
Occupational Therapy Treatment Patient Details Name: James Clarke MRN: [id number] DOB: [birth year] Today's Date: 03/06/2022   History of present illness James Clarke is an 81 y/o male admitted for acute hospitalization secondary to acute abdominal, back pain; admitted for management of AKI secondary to obstructive stone, s/p ureteroscopy with laser lithotripsy with stent placement (02/27/22).   OT comments  Upon entering session, pt sitting at EOB with wife present. Pt deferring OOB mobility this date 2/2 being very fatigued from HD treatment today. Pt requesting to lay back down in bed. He required Max A for sit to supine, able to reposition self in bed using handrails and supervision. D/C recommendation remains appropriate. OT will continue to follow acutely.    Recommendations for follow up therapy are one component of a multi-disciplinary discharge planning process, led by the attending physician.  Recommendations may be updated based on patient status, additional functional criteria and insurance authorization.    Follow Up Recommendations  Home health OT    Assistance Recommended at Discharge Frequent or constant Supervision/Assistance  Patient can return home with the following  A little help with walking and/or transfers;A little help with bathing/dressing/bathroom;Assistance with cooking/housework;Assist for transportation;Help with stairs or ramp for entrance   Equipment Recommendations  BSC/3in1    Recommendations for Other Services      Precautions / Restrictions Precautions Precautions: Fall Restrictions Weight Bearing Restrictions: No       Mobility Bed Mobility Overal bed mobility: Needs Assistance Bed Mobility: Sit to Supine     Supine to sit: Max assist          Transfers                   General transfer comment: not addressed this date, pt deferring OOB mobility     Balance Overall balance assessment: Needs assistance Sitting-balance  support: No upper extremity supported, Feet supported Sitting balance-Leahy Scale: Good         Standing balance comment: NT, pt deferring OOB mobility                           ADL either performed or assessed with clinical judgement   ADL Overall ADL's : Needs assistance/impaired                                       General ADL Comments: Pt deferring ADLs at this time - reported already completing face washing, oral hygiene, and just finished using BSC.    Extremity/Trunk Assessment Upper Extremity Assessment Upper Extremity Assessment: Generalized weakness;RUE deficits/detail RUE Deficits / Details: Pt reports RUE deficits from shoulder surgery 1 yr ago, AROM limited to <90 deg   Lower Extremity Assessment Lower Extremity Assessment: Defer to PT evaluation   Cervical / Trunk Assessment Cervical / Trunk Assessment: Normal    Vision Baseline Vision/History: 0 No visual deficits Patient Visual Report: No change from baseline     Perception     Praxis      Cognition Arousal/Alertness: Awake/alert Behavior During Therapy: WFL for tasks assessed/performed Overall Cognitive Status: Within Functional Limits for tasks assessed                                          Exercises  Shoulder Instructions       General Comments      Pertinent Vitals/ Pain       Pain Assessment Pain Assessment: Faces Faces Pain Scale: Hurts little more Pain Location: abdomen, back Pain Descriptors / Indicators: Aching, Grimacing Pain Intervention(s): Limited activity within patient's tolerance, Monitored during session, Repositioned  Home Living                                          Prior Functioning/Environment              Frequency  Min 2X/week        Progress Toward Goals  OT Goals(current goals can now be found in the care plan section)  Progress towards OT goals: Progressing toward  goals  Acute Rehab OT Goals Patient Stated Goal: get stronger, return home OT Goal Formulation: With patient/family Time For Goal Achievement: 03/18/22 Potential to Achieve Goals: Good  Plan Discharge plan remains appropriate;Frequency remains appropriate    Co-evaluation                 AM-PAC OT "6 Clicks" Daily Activity     Outcome Measure   Help from another person eating meals?: None Help from another person taking care of personal grooming?: A Little Help from another person toileting, which includes using toliet, bedpan, or urinal?: A Little Help from another person bathing (including washing, rinsing, drying)?: A Lot Help from another person to put on and taking off regular upper body clothing?: A Little Help from another person to put on and taking off regular lower body clothing?: A Lot 6 Click Score: 17    End of Session    OT Visit Diagnosis: Unsteadiness on feet (R26.81);Muscle weakness (generalized) (M62.81)   Activity Tolerance Patient limited by fatigue   Patient Left in bed;with call bell/phone within reach;with bed alarm set;with family/visitor present   Nurse Communication Mobility status        Time: 1007-1219 OT Time Calculation (min): 8 min  Charges: OT General Charges $OT Visit: 1 Visit OT Treatments $Self Care/Home Management : 8-22 mins  [facility] MS, OTR/L ascom [phone]  03/06/22, 3:28 PM

## 2022-03-06 NOTE — Progress Notes (Signed)
PROGRESS NOTE    Hillary Schwegler   TGG:[id number] DOB: 1941/02/21  DOA: 02/25/2022 Date of Service: 03/06/22 PCP: Sofie Hartigan, MD     Brief Narrative / Hospital Course:  Tobie Hellen is a 81 y.o. male with past medical history of chronic kidney disease 4, hypertension, diabetes mellitus type 2, chronic CHF and bilateral leg edema (Echo 218 2018 may shows normal left ventricular cavity size systolic function normal ejection fraction between 50 to 55%), kidney stones, obstructive sleep apnea who presented 02/25/2022 with abdominal pain evaluated in the urgent care initially and was referred to the emergency room after being told that he has acute kidney injury. 08/07: BUN of 80 creatinine of 5.61 (baseline approx 2.5), GFR of 10, WBC 13.3, UA more than 300 protein and small hemoglobin with 0-5 RBCs and 0-5 WBCs. CT Abd/Pelv: stone at the left UVJ with accompanying hydronephrosis.  Renal US: unremarkable. Admitted for ARF on CKD3 suspected prerenal d/t obstructing 2-3 mm calculi within the distal left ureter, and dehydration from decreased po intake plus home lasix and Valsartan use. DVT study given edema but more likely amlodipine effect and hypoalbuminemia.  08/08: seen by urology and nephrology. Echo done: LVEF 55-60 w/ G2 DD.  08/09: Patient underwent left ureteroscopy  08/10: Cr 5.91, UA abnormal, started Rocephin for UTI pending cultures, will repeat urine lytes to confirm FeNa --> concern for intrinsic causes.  Nephrology recs: No need for dialysis at the moment. 08/11: Minimal improvement in Cr 5.74.  No growth on urine culture, continuing current IV antibiotics. Will recollect urine.  Patient also complaining of some discharge/irritation around urethra/glans, treating for yeast infection 08/12: Cr 5.5.  08/13: Cr 5.14.  08/14: Cr 4.8. Still pending UCx result from 08/11 recollection. Nephrology team is planning for temporary dialysis tomorrow. Pt reports continued loose stool w/  episodes of stool incontinence, no fever, GI PCR negative. Foley d/c tomorrow for voiding trial per urology. Consult vascular surgery for placement of temp cath tomorrow for dialysis which should hopefully also help fluid overload since lasix unable to be provided d/t renal dysfunction.  08/15: temporary dialysis catheter placed. Plan initiate HD for 3 consecutive days, underwent first session today, no fluid removed. Plan remove Foley in AM for voiding trial.  08/16: HD this AM.  Foley out  Appreciate specialist recommendations: Urology - Dorma Russell showed no growth and repeat urine culture results are pending (specimen ordered and not sent) but question inflammatory response - request recommendations re: need for continued antibiotics     Consultants:  Urology Nephrology   Procedures: Echo L ureteroscopy 02/27/22    Subjective: Seen and examined in dialysis suite.  Patient reports feeling tired today, feeling short winded about the same as previous days, no worse.  Lower extremity edema also present and no worse.  Foley was removed this morning and patient reports he was able to urinate some without it     ASSESSMENT & PLAN:   Principal Problem:   Acute renal failure superimposed on stage 4 chronic kidney disease ([place]) Active Problems:   Type 2 diabetes mellitus with renal manifestations (HCC)   Sleep apnea   Hypertension   Bilateral leg edema   Left ureteral calculus   Chronic diastolic CHF (congestive heart failure) (HCC)   Hydronephrosis of left kidney   UTI (urinary tract infection)   Skin irritation   Diarrhea  Acute renal failure superimposed on stage 4 chronic kidney disease (HCC) Cr 5.61 on presentation.  Baseline around 2.5.  AKI due to obstruction, from obstructing 2-3 mm calculi within the distal left ureter, and/or dehydration from home lasix and Valsartan use. Nephrology and urology following Nephrology planning for dialysis started 08/15 and plan for 3 consecutive  days Serial BMP  no IV fluids to avoid CHF exacerbation, goal that HD can improve fluid status  Treating for UTI - see A/P, Urology following   Type 2 diabetes mellitus with renal manifestations (HCC) A1c 6.4, well controlled.   Hyperglycemia due to not resumed on home insulin Plan: resume home Levemir at 25u daily add mealtime 8u TID  Sleep apnea CPAP per home setting.  Hypertension hold home lasix and Valsartan due to AKI/ARF cont home Toprol add hydralazine 50 mg Q8h due to elevated BP  Bilateral leg edema hold home lasix 2/2 AKI dialysis hopefully will improve edema  Chronic diastolic CHF (congestive heart failure) (HCC) hold home lasix 2/2 AKI Echo done this admission no cocnerns  Planning for dialysis may help fluid status he appears slightly overloaded as of 08/15  Hydronephrosis of left kidney Left ureteral calculus Obstructing 2-3 mm calculi within the distal left ureter just proximal to the left ureterovesicular junction resulting in mild left hydronephrosis and moderate perinephric stranding. urology L ureteroscopy 08/09  UTI (urinary tract infection) vs noninfectious cystitis  Rocephin initiated 02/28/2022, at 1 day postop left ureteroscopy Cultures pending --> no growth Urine repeat Cx ordered and not sent  Urology following, may be inflammatory effect?  Skin irritation Glans penis / urethral meatus Suspect mild yeast infection versus irritation due to Foley catheter, recent instrumentation Diflucan ordered  Keep groin/penis dry as able   Diarrhea Seems most likely d/t antibiotics GI PCR negative Improved w/ loperamide Seek input from urology re: need for continuation for abx for now awaiting repeat UCx vs if inflammatory effect on previous UA and can d/c      DVT prophylaxis: heparin Code Status: FULL Family Communication: None at this time Disposition Plan / TOC needs: remains inpatient, PT/OT following   Barriers to discharge / significant  pending items: dialysis x3 days, anticipate will be here until at least 8/18-8/19           Objective: Vitals:   03/06/22 1030 03/06/22 1032 03/06/22 1045 03/06/22 1115  BP: 139/76 (!) 143/66 (!) 163/108   Pulse: 78 77 79   Resp: '15 18 15   '$ Temp:  98.5 F (36.9 C)    TempSrc:  Oral    SpO2: 97% 96%    Weight:    (!) 142.1 kg  Height:        Intake/Output Summary (Last 24 hours) at 03/06/2022 1233 Last data filed at 03/06/2022 1100 Gross per 24 hour  Intake 100 ml  Output 2500 ml  Net -2400 ml   Filed Weights   03/05/22 1733 03/06/22 0800 03/06/22 1115  Weight: (!) 142.2 kg (!) 141.8 kg (!) 142.1 kg    Examination:  Constitutional:  VS as above General Appearance: alert, well-nourished, NAD Ears, Nose, Mouth, Throat: Normal appearance Neck: No masses, trachea midline Respiratory: Normal respiratory effort Breath sounds faint rales vs atelectasis Cardiovascular: S1/S2 normal, no murmur/rub/gallop auscultated +1 lower extremity edema Gastrointestinal: Nontender, no masses Musculoskeletal:  No clubbing/cyanosis of digits Neurological: No cranial nerve deficit on limited exam Psychiatric: Normal judgment/insight Normal mood and affect       Scheduled Medications:   amLODipine  10 mg Oral Daily   aspirin EC  81 mg Oral Daily   Chlorhexidine Gluconate Cloth  6 each Topical Q0600   diphenhydrAMINE  25 mg Intravenous Once in dialysis   heparin  5,000 Units Subcutaneous Q8H   hydrALAZINE  50 mg Oral Q8H   insulin aspart  0-15 Units Subcutaneous TID WC   insulin aspart  10 Units Subcutaneous TID WC   insulin detemir  30 Units Subcutaneous Daily   metoprolol succinate  50 mg Oral Daily   nystatin   Topical BID   polyethylene glycol  34 g Oral BID   sodium chloride flush  3 mL Intravenous Q12H   tamsulosin  0.4 mg Oral Daily    Continuous Infusions:  cefTRIAXone (ROCEPHIN)  IV 2 g (03/06/22 1207)    PRN Medications:  acetaminophen **OR**  acetaminophen, albuterol, hydrALAZINE, HYDROcodone-acetaminophen, loperamide  Antimicrobials:  Anti-infectives (From admission, onward)    Start     Dose/Rate Route Frequency Ordered Stop   03/01/22 1430  fluconazole (DIFLUCAN) tablet 150 mg        150 mg Oral  Once 03/01/22 1339 03/01/22 1604   02/28/22 1300  cefTRIAXone (ROCEPHIN) 2 g in sodium chloride 0.9 % 100 mL IVPB        2 g 200 mL/hr over 30 Minutes Intravenous Every 24 hours 02/28/22 1208         Data Reviewed: I have personally reviewed following labs and imaging studies  CBC: Recent Labs  Lab 02/28/22 0402 03/01/22 0353 03/04/22 0400  WBC 12.8* 12.2* 9.0  HGB 10.4* 10.7* 10.3*  HCT 32.9* 34.1* 32.8*  MCV 90.1 90.0 89.9  PLT 299 226 623   Basic Metabolic Panel: Recent Labs  Lab 02/28/22 0402 03/01/22 0353 03/02/22 0600 03/03/22 0515 03/04/22 0400 03/05/22 0812 03/06/22 0420  NA 138 139 139 141 143 142 141  K 4.5 4.5 4.2 3.9 3.6 3.9 3.9  CL 108 110 110 113* 115* 114* 110  CO2 21* 20* 20* 21* 20* 18* 24  GLUCOSE 226* 141* 199* 154* 131* 179* 217*  BUN 89* 93* 93* 90* 85* 78* 57*  CREATININE 5.91* 5.74* 5.50* 5.14* 4.81* 4.53* 3.61*  CALCIUM 8.0* 7.9* 7.9* 8.0* 8.2* 8.3* 8.0*  MG 2.3 2.2 2.2 2.1  --   --   --    GFR: Estimated Creatinine Clearance: 22.8 mL/min (A) (by C-G formula based on SCr of 3.61 mg/dL (H)). Liver Function Tests: No results for input(s): "AST", "ALT", "ALKPHOS", "BILITOT", "PROT", "ALBUMIN" in the last 168 hours.  No results for input(s): "LIPASE", "AMYLASE" in the last 168 hours. No results for input(s): "AMMONIA" in the last 168 hours. Coagulation Profile: No results for input(s): "INR", "PROTIME" in the last 168 hours. Cardiac Enzymes: No results for input(s): "CKTOTAL", "CKMB", "CKMBINDEX", "TROPONINI" in the last 168 hours. BNP (last 3 results) No results for input(s): "PROBNP" in the last 8760 hours. HbA1C: No results for input(s): "HGBA1C" in the last 72  hours.  CBG: Recent Labs  Lab 03/05/22 0754 03/05/22 1111 03/05/22 1800 03/05/22 2049 03/06/22 0722  GLUCAP 175* 263* 171* 289* 271*   Lipid Profile: No results for input(s): "CHOL", "HDL", "LDLCALC", "TRIG", "CHOLHDL", "LDLDIRECT" in the last 72 hours. Thyroid Function Tests: No results for input(s): "TSH", "T4TOTAL", "FREET4", "T3FREE", "THYROIDAB" in the last 72 hours.  Anemia Panel: No results for input(s): "VITAMINB12", "FOLATE", "FERRITIN", "TIBC", "IRON", "RETICCTPCT" in the last 72 hours. Urine analysis:    Component Value Date/Time   COLORURINE YELLOW (A) 02/28/2022 0958   APPEARANCEUR CLOUDY (A) 02/28/2022 0958   APPEARANCEUR Hazy (A) 08/19/2019 1113  LABSPEC 1.013 02/28/2022 0958   LABSPEC 1.008 11/27/2013 0810   PHURINE 5.0 02/28/2022 0958   GLUCOSEU >=500 (A) 02/28/2022 0958   GLUCOSEU Negative 11/27/2013 0810   HGBUR LARGE (A) 02/28/2022 0958   BILIRUBINUR NEGATIVE 02/28/2022 0958   BILIRUBINUR Negative 08/19/2019 1113   BILIRUBINUR Negative 11/27/2013 0810   KETONESUR 5 (A) 02/28/2022 0958   PROTEINUR >=300 (A) 02/28/2022 0958   NITRITE NEGATIVE 02/28/2022 0958   LEUKOCYTESUR MODERATE (A) 02/28/2022 0958   LEUKOCYTESUR Negative 11/27/2013 0810   Sepsis Labs: '@LABRCNTIP'$ (procalcitonin:4,lacticidven:4)  Recent Results (from the past 240 hour(s))  Urine Culture     Status: None   Collection Time: 02/28/22 12:50 PM   Specimen: Urine, Catheterized  Result Value Ref Range Status   Specimen Description   Final    URINE, CATHETERIZED Performed at [facility], [address], [place], [place] [address]    Special Requests   Final    NONE Performed at [facility], [address] [place], [place] [address]    Culture   Final    NO GROWTH Performed at [facility], [place] [address] [place], [place] [address]    Report Status 03/01/2022 FINAL  Final  Gastrointestinal Panel by PCR , Stool     Status: None   Collection Time:  03/04/22  1:18 PM   Specimen: Stool  Result Value Ref Range Status   Campylobacter species NOT DETECTED NOT DETECTED Final   Plesimonas shigelloides NOT DETECTED NOT DETECTED Final   Salmonella species NOT DETECTED NOT DETECTED Final   Yersinia enterocolitica NOT DETECTED NOT DETECTED Final   Vibrio species NOT DETECTED NOT DETECTED Final   Vibrio cholerae NOT DETECTED NOT DETECTED Final   Enteroaggregative E coli (EAEC) NOT DETECTED NOT DETECTED Final   Enteropathogenic E coli (EPEC) NOT DETECTED NOT DETECTED Final   Enterotoxigenic E coli (ETEC) NOT DETECTED NOT DETECTED Final   Shiga like toxin producing E coli (STEC) NOT DETECTED NOT DETECTED Final   Shigella/Enteroinvasive E coli (EIEC) NOT DETECTED NOT DETECTED Final   Cryptosporidium NOT DETECTED NOT DETECTED Final   Cyclospora cayetanensis NOT DETECTED NOT DETECTED Final   Entamoeba histolytica NOT DETECTED NOT DETECTED Final   Giardia lamblia NOT DETECTED NOT DETECTED Final   Adenovirus F40/41 NOT DETECTED NOT DETECTED Final   Astrovirus NOT DETECTED NOT DETECTED Final   Norovirus GI/GII NOT DETECTED NOT DETECTED Final   Rotavirus A NOT DETECTED NOT DETECTED Final   Sapovirus (I, II, IV, and V) NOT DETECTED NOT DETECTED Final    Comment: Performed at [facility], [address], [place], [place] [address]         Radiology Studies last 96 hours: PERIPHERAL VASCULAR CATHETERIZATION  Result Date: 03/05/2022 See surgical note for result.           LOS: 8 days     Emeterio Reeve, DO [facility] 03/06/2022, 12:33 PM   Staff may message me via secure chat in [place]  but this may not receive immediate response,  please page for urgent matters!  If 7PM-7AM, please contact night-coverage [email or web address]  Dictation software was used to generate the above note. Typos may occur and escape review, as with typed/written notes. Please contact Dr Sheppard Coil directly for clarity if needed.

## 2022-03-06 NOTE — Plan of Care (Signed)
  Problem: Elimination: Goal: Will not experience complications related to bowel motility Outcome: Not Progressing   Problem: Elimination: Goal: Will not experience complications related to urinary retention Outcome: Not Progressing   Problem: Clinical Measurements: Goal: Diagnostic test results will improve Outcome: Not Progressing

## 2022-03-06 NOTE — Progress Notes (Signed)
[facility]  PROGRESS NOTE   Subjective:   Patient seen and evaluated during dialysis   HEMODIALYSIS FLOWSHEET:  Blood Flow Rate (mL/min): 200 mL/min Arterial Pressure (mmHg): -110 mmHg Venous Pressure (mmHg): 140 mmHg TMP (mmHg): 12 mmHg Ultrafiltration Rate (mL/min): 280 mL/min Dialysate Flow Rate (mL/min): 300 ml/min Dialysis Fluid Bolus: Normal Saline Bolus Amount (mL): 300 mL  Patient remains anxious for treatment No other complaints at this time.  Urine output 1.3 L in preceding 24 hours  Objective:  Vital signs: Blood pressure (!) 163/108, pulse 79, temperature 98.5 F (36.9 C), temperature source Oral, resp. rate 15, height '5\' 10"'$  (1.778 m), weight (!) 142.1 kg, SpO2 96 %.  Intake/Output Summary (Last 24 hours) at 03/06/2022 1227 Last data filed at 03/06/2022 1100 Gross per 24 hour  Intake 100 ml  Output 2500 ml  Net -2400 ml    Filed Weights   03/05/22 1733 03/06/22 0800 03/06/22 1115  Weight: (!) 142.2 kg (!) 141.8 kg (!) 142.1 kg     Physical Exam: General:  No acute distress, anxious  Head:  Normocephalic, atraumatic. Moist oral mucosal membranes  Eyes:  Anicteric  Lungs:   Clear to auscultation, normal effort  Heart:  S1S2 no rubs  Abdomen:   Soft, nontender, bowel sounds present  Extremities:  1+ peripheral edema.  Neurologic:  Awake, alert, following commands  Skin:  No lesions  Access: Right femoral HD temp cath placed on 01/12/7627    Basic Metabolic Panel: Recent Labs  Lab 02/28/22 0402 03/01/22 0353 03/02/22 0600 03/03/22 0515 03/04/22 0400 03/05/22 0812 03/06/22 0420  NA 138 139 139 141 143 142 141  K 4.5 4.5 4.2 3.9 3.6 3.9 3.9  CL 108 110 110 113* 115* 114* 110  CO2 21* 20* 20* 21* 20* 18* 24  GLUCOSE 226* 141* 199* 154* 131* 179* 217*  BUN 89* 93* 93* 90* 85* 78* 57*  CREATININE 5.91* 5.74* 5.50* 5.14* 4.81* 4.53* 3.61*  CALCIUM 8.0* 7.9* 7.9* 8.0* 8.2* 8.3* 8.0*  MG 2.3 2.2 2.2 2.1  --   --   --       CBC: Recent Labs  Lab 02/28/22 0402 03/01/22 0353 03/04/22 0400  WBC 12.8* 12.2* 9.0  HGB 10.4* 10.7* 10.3*  HCT 32.9* 34.1* 32.8*  MCV 90.1 90.0 89.9  PLT 299 226 316      Urinalysis: No results for input(s): "COLORURINE", "LABSPEC", "PHURINE", "GLUCOSEU", "HGBUR", "BILIRUBINUR", "KETONESUR", "PROTEINUR", "UROBILINOGEN", "NITRITE", "LEUKOCYTESUR" in the last 72 hours.  Invalid input(s): "APPERANCEUR"    Imaging: PERIPHERAL VASCULAR CATHETERIZATION  Result Date: 03/05/2022 See surgical note for result.    Medications:    cefTRIAXone (ROCEPHIN)  IV 2 g (03/06/22 1207)    amLODipine  10 mg Oral Daily   aspirin EC  81 mg Oral Daily   Chlorhexidine Gluconate Cloth  6 each Topical Q0600   diphenhydrAMINE  25 mg Intravenous Once in dialysis   heparin  5,000 Units Subcutaneous Q8H   hydrALAZINE  50 mg Oral Q8H   insulin aspart  0-15 Units Subcutaneous TID WC   insulin aspart  10 Units Subcutaneous TID WC   insulin detemir  30 Units Subcutaneous Daily   metoprolol succinate  50 mg Oral Daily   nystatin   Topical BID   polyethylene glycol  34 g Oral BID   sodium chloride flush  3 mL Intravenous Q12H   tamsulosin  0.4 mg Oral Daily    Assessment/ Plan:     Principal Problem:  Acute renal failure superimposed on stage 4 chronic kidney disease (HCC) Active Problems:   Type 2 diabetes mellitus with renal manifestations (HCC)   Sleep apnea   Hypertension   Bilateral leg edema   Left ureteral calculus   Chronic diastolic CHF (congestive heart failure) (HCC)   Hydronephrosis of left kidney   UTI (urinary tract infection)   Skin irritation   Diarrhea  [name] is a 81 y.o.  male with past medical conditions including Bell's palsy, diabetes, hypertension, hyperlipidemia, sleep apnea on CPAP, and chronic kidney disease stage IV.  Patient presents to the emergency department at the advice of his nephrologist for abnormal renal function.  Patient has been  admitted for Bilateral flank pain [R10.9] Suprapubic abdominal pain [R10.2] Abdominal pain [R10.9] Acute renal failure superimposed on chronic kidney disease, unspecified CKD stage, unspecified acute renal failure type ([place]) [N17.9, N18.9] AKI (acute kidney injury) ([place]) [N17.9]   #1: Acute kidney injury on CKD stage IV: Patient has a baseline creatinine of 3.3.  Patient is now at admitted with obstructive uropathy secondary to nephrolithiasis.  Patient is status post ureteroscopy.  Appreciate vascular surgery placing right femoral temp cath yesterday.  Dialysis initiated yesterday.  Patient experiencing high anxiety, given diphenhydramine 25 mg IV for anxiety.  Patient currently receiving second dialysis treatment today, UF goal 0.5 to 1 L as tolerated.  As needed order placed for diphenhydramine 25 mg IV for as needed use during dialysis.  We will plan a third dialysis treatment tomorrow with additional fluid removal.  We will discontinue temp cath after treatment and allow cath free.  We will monitor for renal recovery during this.  If additional dialysis is needed, will request PermCath from vascular surgery.   #2: Hypertension: Continue present antihypertensive treatment.  Blood pressure 143/66 during dialysis   #3: Diabetes mellitus type II with chronic kidney disease   glucose remains elevated at times.  Primary team has managed with sliding scale insulin.  Glucose elevated, primary team to manage sliding scale insulin.   #4: Anemia with chronic kidney disease: Hemoglobin  10.3.  We will collect updated labs in AM.   #5: Congestive heart failure: We will continue 2 g salt restricted diet with 1 L fluid restriction orally.  Diuretics held.  Will manage fluid levels with dialysis.      LOS: [facility] 8/16/202312:27 PM

## 2022-03-06 NOTE — Inpatient Diabetes Management (Signed)
Inpatient Diabetes Program Recommendations  AACE/ADA: New Consensus Statement on Inpatient Glycemic Control (2015)  Target Ranges:  Prepandial:   less than 140 mg/dL      Peak postprandial:   less than 180 mg/dL (1-2 hours)      Critically ill patients:  140 - 180 mg/dL   Lab Results  Component Value Date   GLUCAP 271 (H) 03/06/2022   HGBA1C 6.4 (H) 02/25/2022    Latest Reference Range & Units 03/05/22 07:54 03/05/22 11:11 03/05/22 18:00 03/05/22 20:49 03/06/22 07:22  Glucose-Capillary 70 - 99 mg/dL 175 (H) 263 (H) 171 (H) 289 (H) 271 (H)  (H): Data is abnormally high  Home DM Meds: Levemir 50 units daily                              Novolog 30 units w/ Breakfast/ 30 units with Lunch/ 55 units with Dinner     Current Orders: Levemir 30 units daily  Novolog 0-15 units TID  Novolog 10 units TID with meals       MD- Please consider:   1. Increase Levemir slightly to 32 units Daily   2. Increase Novolog Meal Coverage to 12 units TID with meals if eats 50% meals  Thank you, Nani Gasser. Captola Teschner, RN, MSN, CDE  Diabetes Coordinator Inpatient Glycemic Control Team Team Pager [phone] (8am-5pm) 03/06/2022 10:13 AM

## 2022-03-06 NOTE — Progress Notes (Signed)
PT Cancellation Note  Patient Details Name: James Clarke MRN: [id number] DOB: January 27, 1941   Cancelled Treatment:    Reason Eval/Treat Not Completed: Other (comment). Pt out of room at this time, PT to re-attempt as able.    [name] PT, DPT 9:22 AM,03/06/22

## 2022-03-07 DIAGNOSIS — I5032 Chronic diastolic (congestive) heart failure: Secondary | ICD-10-CM | POA: Diagnosis not present

## 2022-03-07 DIAGNOSIS — N179 Acute kidney failure, unspecified: Secondary | ICD-10-CM | POA: Diagnosis not present

## 2022-03-07 DIAGNOSIS — I10 Essential (primary) hypertension: Secondary | ICD-10-CM | POA: Diagnosis not present

## 2022-03-07 DIAGNOSIS — N201 Calculus of ureter: Secondary | ICD-10-CM | POA: Diagnosis not present

## 2022-03-07 LAB — GLUCOSE, CAPILLARY
Glucose-Capillary: 154 mg/dL — ABNORMAL HIGH (ref 70–99)
Glucose-Capillary: 103 mg/dL — ABNORMAL HIGH (ref 70–99)
Glucose-Capillary: 124 mg/dL — ABNORMAL HIGH (ref 70–99)
Glucose-Capillary: 148 mg/dL — ABNORMAL HIGH (ref 70–99)

## 2022-03-07 LAB — RENAL FUNCTION PANEL
Albumin: 2.7 g/dL — ABNORMAL LOW (ref 3.5–5.0)
Anion gap: 9 (ref 5–15)
BUN: 45 mg/dL — ABNORMAL HIGH (ref 8–23)
CO2: 27 mmol/L (ref 22–32)
Calcium: 8 mg/dL — ABNORMAL LOW (ref 8.9–10.3)
Chloride: 106 mmol/L (ref 98–111)
Creatinine, Ser: 3.16 mg/dL — ABNORMAL HIGH (ref 0.61–1.24)
GFR, Estimated: 19 mL/min — ABNORMAL LOW (ref >60)
Glucose, Bld: 119 mg/dL — ABNORMAL HIGH (ref 70–99)
Phosphorus: 4.9 mg/dL — ABNORMAL HIGH (ref 2.5–4.6)
Potassium: 3.5 mmol/L (ref 3.5–5.1)
Sodium: 142 mmol/L (ref 135–145)

## 2022-03-07 LAB — CBC
HCT: 33.3 % — ABNORMAL LOW (ref 39.0–52.0)
Hemoglobin: 10.4 g/dL — ABNORMAL LOW (ref 13.0–17.0)
MCH: 28.3 pg (ref 26.0–34.0)
MCHC: 31.2 g/dL (ref 30.0–36.0)
MCV: 90.7 fL (ref 80.0–100.0)
Platelets: 279 10*3/uL (ref 150–400)
RBC: 3.67 MIL/uL — ABNORMAL LOW (ref 4.22–5.81)
RDW: 13.8 % (ref 11.5–15.5)
WBC: 10 10*3/uL (ref 4.0–10.5)
nRBC: 0 % (ref 0.0–0.2)

## 2022-03-07 MED ORDER — INSULIN DETEMIR 100 UNIT/ML ~~LOC~~ SOLN
32.0000 [IU] | Freq: Every day | SUBCUTANEOUS | Status: DC
  Administered 2022-03-08: 32 [IU] via SUBCUTANEOUS
  Filled 2022-03-07: qty 0.32

## 2022-03-07 MED ORDER — INSULIN ASPART 100 UNIT/ML IJ SOLN
12.0000 [IU] | Freq: Three times a day (TID) | INTRAMUSCULAR | Status: DC
  Administered 2022-03-08 (×2): 12 [IU] via SUBCUTANEOUS
  Filled 2022-03-07 (×2): qty 1

## 2022-03-07 MED ORDER — OXYBUTYNIN CHLORIDE 5 MG PO TABS: 5.0000 mg | ORAL_TABLET | Freq: Three times a day (TID) | ORAL | Status: DC | PRN

## 2022-03-07 MED ORDER — HEPARIN SODIUM (PORCINE) 1000 UNIT/ML IJ SOLN
INTRAMUSCULAR | Status: AC
  Filled 2022-03-07: qty 10

## 2022-03-07 NOTE — Plan of Care (Signed)
Pt AAOx4, just returned from dialysis. No pain. Plan to remove femoral HD cath and replace it. Bed in lowest position, call light within reach. Will continue to monitor.

## 2022-03-07 NOTE — Progress Notes (Signed)
Hd tx of 3hrs completed. 54L total vol processed. No complications. Report given to [employer], rn.  Total uf removed: 536m Post hd v/s: 98.4 138/66(85) 75 21 97% Post hd wt: 141.4kgs

## 2022-03-07 NOTE — Progress Notes (Signed)
Progress Note   Patient: James Clarke JOI:[id number] DOB: 1940-11-23 DOA: 02/25/2022     9 DOS: the patient was seen and examined on 03/07/2022   Brief hospital course: James Clarke is a 81 y.o. male with past medical history of chronic kidney disease 4, hypertension, diabetes mellitus type 2, chronic CHF and bilateral leg edema (Echo 218 2018 may shows normal left ventricular cavity size systolic function normal ejection fraction between 50 to 55%), kidney stones, obstructive sleep apnea who presented 02/25/2022 with abdominal pain evaluated in the urgent care initially and was referred to the emergency room after being told that he has acute kidney injury. Marland Kitchen 08/07: BUN of 80 creatinine of 5.61 (baseline approx 2.5), GFR of 10, WBC 13.3, UA more than 300 protein and small hemoglobin with 0-5 RBCs and 0-5 WBCs. CT Abd/Pelv: stone at the left UVJ with accompanying hydronephrosis.  Renal US: unremarkable. Admitted for ARF on CKD3 suspected prerenal d/t obstructing 2-3 mm calculi within the distal left ureter, and dehydration from decreased po intake plus home lasix and Valsartan use. DVT study given edema but more likely amlodipine effect and hypoalbuminemia.  Marland Kitchen 08/08: seen by urology and nephrology. Echo done: LVEF 55-60 w/ G2 DD.  Marland Kitchen 08/09: Patient underwent left ureteroscopy  . 08/10: Cr 5.91, UA abnormal, started Rocephin for UTI pending cultures, will repeat urine lytes to confirm FeNa --> concern for intrinsic causes.  Nephrology recs: No need for dialysis at the moment. Marland Kitchen 08/11: Minimal improvement in Cr 5.74.  No growth on urine culture, continuing current IV antibiotics. Will recollect urine.  Patient also complaining of some discharge/irritation around urethra/glans, treating for yeast infection . 08/12: Cr 5.5.  . 08/13: Cr 5.14.  Marland Kitchen 08/14: Cr 4.8. Still pending UCx result from 08/11 recollection. Nephrology team is planning for temporary dialysis tomorrow. Pt reports continued loose stool  w/ episodes of stool incontinence, no fever, GI PCR negative. Foley d/c tomorrow for voiding trial per urology. Consult vascular surgery for placement of temp cath tomorrow for dialysis which should hopefully also help fluid overload since lasix unable to be provided d/t renal dysfunction.  Marland Kitchen 08/15: temporary dialysis catheter placed. Plan initiate HD for 3 consecutive days, underwent first session today, no fluid removed. Plan remove Foley in AM for voiding trial.  . 08/16: HD this AM.  Foley out . 8/17: Temp cath to be removed after dialysis today.  Await renal recovery over the weekend to determine long-term dialysis need for nephrology.  Patient and wife requesting to stop antibiotics as they are afraid it is upsetting his stomach  Appreciate specialist recommendations: . Urology - UCX showed no growth and repeat urine culture results are pending (specimen ordered and not sent) but question inflammatory response - request recommendations re: need for continued antibiotics   Assessment and Plan: * Acute renal failure superimposed on stage 4 chronic kidney disease (HCC) Cr 5.61 on presentation.  Baseline around 2.5. AKI due to obstruction, from obstructing 2-3 mm calculi within the distal left ureter, and/or dehydration from home lasix and Valsartan use.  Nephrology and urology following  Status post 3 hemodialysis session through temporary dialysis catheter per nephrology.  Catheter to be removed after dialysis session today and monitor renal function over the weekend to decide long-term dialysis need for nephrology  Lab Results  Component Value Date   CREATININE 3.16 (H) 03/07/2022   CREATININE 3.61 (H) 03/06/2022   CREATININE 4.53 (H) 03/05/2022     Abdominal pain-resolved as of  03/03/2022 CT Abd/Pelv showed Obstructing 2-3 mm calculi within the distal left ureter, however, pt's abdominal pain was across lower abdomen, unlike pt's prior episode of kidney stone pains.   Abdominal pain  improved some with BM.  Pain control  Foley in place  Urology following   Type 2 diabetes mellitus with renal manifestations (HCC) A1c 6.4,   We will increase insulin Levemir to 32 units subcu daily and mealtime NovoLog to 12 units subcu 3 times daily.  Continue sliding scale  Diarrhea Seems most likely d/t antibiotics  Stop Rocephin.  Diarrhea has improved but he does report some stomach upset   Skin irritation Glans penis / urethral meatus Suspect mild yeast infection versus irritation due to Foley catheter, recent instrumentation  Treated with Diflucan and improved  Keep groin/penis dry as able   UTI (urinary tract infection)  Rocephin initiated 02/28/2022, at 1 day postop left ureteroscopy.  We will discontinue antibiotic as he is afebrile and has no leukocytosis  Cultures pending --> no growth  Hydronephrosis of left kidney Obstructing 2-3 mm calculi within the distal left ureter just proximal to the left ureterovesicular junction resulting in mild left hydronephrosis and moderate perinephric stranding.  urology L ureteroscopy 08/09  I have added oxybutynin 5 mg p.o. 3 times daily as needed.  Continue Flomax  Chronic diastolic CHF (congestive heart failure) (HCC)  Lasix on hold due to kidney failure  Temporary dialysis catheter to be removed after dialysis session today.  Plan to monitor kidney function over the weekend to decide long-term dialysis need  Left ureteral calculus  Urology: s/p L ureteroscopy 08/09.  Outpatient urology follow-up.  Continue Flomax and add oxybutynin 5 mg p.o. 3 times daily as needed  Bilateral leg edema  Improved with dialysis  Hypertension  hold home lasix and Valsartan due to AKI  cont home Toprol  add hydralazine 50 mg Q8h due to elevated BP.  As needed hydralazine for systolic blood pressure more than 160  Sleep apnea  CPAP per home setting        Subjective: Requesting his antibiotics to be stopped as he is  afraid it is upsetting his stomach.  Wife at bedside  Physical Exam: Vitals:   03/07/22 1104 03/07/22 1107 03/07/22 1149 03/07/22 1625  BP: 119/77 138/66 (!) 142/76 (!) 154/64  Pulse: 79 75 68 79  Resp: 18 (!) '21 16 18  '$ Temp:  98.4 F (36.9 C) 98.3 F (36.8 C) 97.7 F (36.5 C)  TempSrc:  Oral  Oral  SpO2: 99% 97% 98% 96%  Weight:      Height:       81 year old morbidly obese male lying in the bed comfortably without any acute distress Lungs decreased breath sounds at the bases Cardiovascular regular rate and rhythm Abdomen soft, obese, benign Neuro alert and awake, nonfocal Psych normal mood and affect Data Reviewed:  Creatinine 3.16  Family Communication: Wife updated at bedside  Disposition: Status is: Inpatient Remains inpatient appropriate because: Monitoring of kidney function with need for long-term dialysis   Planned Discharge Destination: Home with Home Health    DVT prophylaxis-heparin Time spent: 35 minutes  Author: Max Sane, MD 03/07/2022 5:03 PM  For on call review [email or web address].

## 2022-03-07 NOTE — Progress Notes (Signed)
[facility]  PROGRESS NOTE   Subjective:   Patient seen and evaluated during dialysis   HEMODIALYSIS FLOWSHEET:  Blood Flow Rate (mL/min): 200 mL/min Arterial Pressure (mmHg): -140 mmHg Venous Pressure (mmHg): 170 mmHg TMP (mmHg): 5 mmHg Ultrafiltration Rate (mL/min): 433 mL/min Dialysate Flow Rate (mL/min): 300 ml/min Dialysis Fluid Bolus: Normal Saline Bolus Amount (mL): 300 mL  No complaints at this time Patient appears more comfortable today, less anxious  Objective:  Vital signs: Blood pressure (!) 142/76, pulse 68, temperature 98.3 F (36.8 C), resp. rate 16, height '5\' 10"'$  (1.778 m), weight (!) 141.9 kg, SpO2 98 %.  Intake/Output Summary (Last 24 hours) at 03/07/2022 1156 Last data filed at 03/07/2022 1107 Gross per 24 hour  Intake 577 ml  Output 700.5 ml  Net -123.5 ml    Filed Weights   03/06/22 1115 03/07/22 0454 03/07/22 0830  Weight: (!) 142.1 kg (!) 141.9 kg (!) 141.9 kg     Physical Exam: General:  No acute distress  Head:  Normocephalic, atraumatic. Moist oral mucosal membranes  Eyes:  Anicteric  Lungs:   Clear to auscultation, normal effort  Heart:  S1S2 no rubs  Abdomen:   Soft, nontender, bowel sounds present  Extremities:  1+ peripheral edema.  Neurologic:  Awake, alert, following commands  Skin:  No lesions  Access: Right femoral HD temp cath placed on 10/28/8117    Basic Metabolic Panel: Recent Labs  Lab 03/01/22 0353 03/02/22 0600 03/03/22 0515 03/04/22 0400 03/05/22 0812 03/06/22 0420 03/07/22 0404  NA 139 139 141 143 142 141 142  K 4.5 4.2 3.9 3.6 3.9 3.9 3.5  CL 110 110 113* 115* 114* 110 106  CO2 20* 20* 21* 20* 18* 24 27  GLUCOSE 141* 199* 154* 131* 179* 217* 119*  BUN 93* 93* 90* 85* 78* 57* 45*  CREATININE 5.74* 5.50* 5.14* 4.81* 4.53* 3.61* 3.16*  CALCIUM 7.9* 7.9* 8.0* 8.2* 8.3* 8.0* 8.0*  MG 2.2 2.2 2.1  --   --   --   --   PHOS  --   --   --   --   --   --  4.9*     CBC: Recent Labs  Lab  03/01/22 0353 03/04/22 0400 03/07/22 0404  WBC 12.2* 9.0 10.0  HGB 10.7* 10.3* 10.4*  HCT 34.1* 32.8* 33.3*  MCV 90.0 89.9 90.7  PLT 226 316 279      Urinalysis: No results for input(s): "COLORURINE", "LABSPEC", "PHURINE", "GLUCOSEU", "HGBUR", "BILIRUBINUR", "KETONESUR", "PROTEINUR", "UROBILINOGEN", "NITRITE", "LEUKOCYTESUR" in the last 72 hours.  Invalid input(s): "APPERANCEUR"    Imaging: PERIPHERAL VASCULAR CATHETERIZATION  Result Date: 03/05/2022 See surgical note for result.    Medications:    cefTRIAXone (ROCEPHIN)  IV Stopped (03/06/22 1238)    amLODipine  10 mg Oral Daily   aspirin EC  81 mg Oral Daily   Chlorhexidine Gluconate Cloth  6 each Topical Q0600   diphenhydrAMINE  25 mg Intravenous Once in dialysis   heparin  5,000 Units Subcutaneous Q8H   heparin sodium (porcine)       hydrALAZINE  50 mg Oral Q8H   insulin aspart  0-15 Units Subcutaneous TID WC   insulin aspart  10 Units Subcutaneous TID WC   insulin detemir  30 Units Subcutaneous Daily   metoprolol succinate  50 mg Oral Daily   nystatin   Topical BID   polyethylene glycol  34 g Oral BID   sodium chloride flush  3 mL Intravenous Q12H  tamsulosin  0.4 mg Oral Daily    Assessment/ Plan:     Principal Problem:   Acute renal failure superimposed on stage 4 chronic kidney disease (HCC) Active Problems:   Type 2 diabetes mellitus with renal manifestations (HCC)   Sleep apnea   Hypertension   Bilateral leg edema   Left ureteral calculus   Chronic diastolic CHF (congestive heart failure) (HCC)   Hydronephrosis of left kidney   UTI (urinary tract infection)   Skin irritation   Diarrhea  Mr. Camdon Saetern is a 81 y.o.  male with past medical conditions including Bell's palsy, diabetes, hypertension, hyperlipidemia, sleep apnea on CPAP, and chronic kidney disease stage IV.  Patient presents to the emergency department at the advice of his nephrologist for abnormal renal function.  Patient has  been admitted for Bilateral flank pain [R10.9] Suprapubic abdominal pain [R10.2] Abdominal pain [R10.9] Acute renal failure superimposed on chronic kidney disease, unspecified CKD stage, unspecified acute renal failure type ([place]) [N17.9, N18.9] AKI (acute kidney injury) ([place]) [N17.9]   #1: Acute kidney injury on CKD stage IV: Patient has a baseline creatinine of 3.3.  Patient is now at admitted with obstructive uropathy secondary to nephrolithiasis.  Patient is status post ureteroscopy.  Patient received third dialysis treatment today, low UF due to adequate urine output.  Plan to remove temp cath after dialysis today.  We will monitor for renal recovery through the weekend and determine if long term treatment is required.   #2: Hypertension: Continue present antihypertensive treatment.  Blood pressure 142/76.   #3: Diabetes mellitus type II with chronic kidney disease   glucose remains elevated at times.  Primary team has managed with sliding scale insulin.  Primary team to manage sliding scale insulin.   #4: Anemia with chronic kidney disease: Hemoglobin remains stable, 10.4.     #5: Congestive heart failure: We will continue 2 g salt restricted diet with 1 L fluid restriction orally.  Fluid successfully managed with dialysis.  We will hold dialysis and monitor renal function.  We will assess for diuretic reinitiation.    LOS: [facility] 8/17/202311:56 AM

## 2022-03-07 NOTE — Progress Notes (Signed)
Physical Therapy Treatment Patient Details Name: James Clarke MRN: [id number] DOB: December 27, 1940 Today's Date: 03/07/2022   History of Present Illness James Clarke is an 81 y/o male admitted for acute hospitalization secondary to acute abdominal, back pain; admitted for management of AKI secondary to obstructive stone, s/p ureteroscopy with laser lithotripsy with stent placement (02/27/22).    PT Comments    Patient alert, agreeable to exercises with some encouragement, deferred OOB activity due to nausea, fatigue after HD. He was able to perform bilateral LE exercises with verbal and tactile cues. Time spent educating pt on temp cath activity restrictions per MD "BSC only, we're fine with bed exercises and stand pivot to recliner. He can recline back in chair, not sit straight up for long periods, that may pinch off the catheter."  Pt somewhat agreeable but very eager to continue to ambulate to bathroom. Pt with all needs in reach at end of session. The patient would benefit from further skilled PT intervention to continue to progress towards goals. Recommendation remains appropriate pending further progress with mobility.        Recommendations for follow up therapy are one component of a multi-disciplinary discharge planning process, led by the attending physician.  Recommendations may be updated based on patient status, additional functional criteria and insurance authorization.  Follow Up Recommendations  Home health PT     Assistance Recommended at Discharge PRN  Patient can return home with the following A little help with walking and/or transfers;A little help with bathing/dressing/bathroom   Equipment Recommendations  Rolling walker (2 wheels);Other (comment) (bariatric)    Recommendations for Other Services       Precautions / Restrictions Precautions Precautions: Fall Restrictions Weight Bearing Restrictions: No     Mobility  Bed Mobility               General  bed mobility comments: deferred by pt at this time    Transfers                        Ambulation/Gait                   Stairs             Wheelchair Mobility    Modified Rankin (Stroke Patients Only)       Balance                                            Cognition Arousal/Alertness: Awake/alert Behavior During Therapy: WFL for tasks assessed/performed Overall Cognitive Status: Within Functional Limits for tasks assessed                                          Exercises General Exercises - Lower Extremity Ankle Circles/Pumps: AROM, Both, 15 reps Quad Sets: AROM, Both, 15 reps, Strengthening Gluteal Sets: AROM, Both, 15 reps, Strengthening Short Arc Quad: AROM, Both, 15 reps, Strengthening Heel Slides: AROM, Strengthening, Both, 15 reps    General Comments        Pertinent Vitals/Pain Pain Assessment Pain Assessment: Faces Faces Pain Scale: Hurts a little bit Pain Location: abdomen, back Pain Descriptors / Indicators: Aching, Grimacing Pain Intervention(s): Limited activity within patient's tolerance, Monitored during session, Repositioned    Home  Living                          Prior Function            PT Goals (current goals can now be found in the care plan section) Progress towards PT goals: Progressing toward goals (limited by temp cath)    Frequency    Min 2X/week      PT Plan Current plan remains appropriate    Co-evaluation              AM-PAC PT "6 Clicks" Mobility   Outcome Measure  Help needed turning from your back to your side while in a flat bed without using bedrails?: None Help needed moving from lying on your back to sitting on the side of a flat bed without using bedrails?: A Little Help needed moving to and from a bed to a chair (including a wheelchair)?: A Little Help needed standing up from a chair using your arms (e.g., wheelchair or bedside  chair)?: A Little Help needed to walk in hospital room?: A Little Help needed climbing 3-5 steps with a railing? : A Little 6 Click Score: 19    End of Session Equipment Utilized During Treatment: Gait belt Activity Tolerance: Patient tolerated treatment well Patient left: in bed;with bed alarm set;with call bell/phone within reach;with family/visitor present Nurse Communication: Mobility status PT Visit Diagnosis: Muscle weakness (generalized) (M62.81);Difficulty in walking, not elsewhere classified (R26.2)     Time: 9390-3009 PT Time Calculation (min) (ACUTE ONLY): 16 min  Charges:  $Therapeutic Exercise: 8-22 mins                     [name] PT, DPT 3:49 PM,03/07/22

## 2022-03-07 NOTE — Plan of Care (Signed)
  Problem: Metabolic: Goal: Ability to maintain appropriate glucose levels will improve Outcome: Not Progressing   Problem: Clinical Measurements: Goal: Cardiovascular complication will be avoided Outcome: Not Progressing   Problem: Elimination: Goal: Will not experience complications related to urinary retention Outcome: Not Progressing   Problem: Elimination: Goal: Will not experience complications related to bowel motility Outcome: Not Progressing

## 2022-03-08 DIAGNOSIS — N184 Chronic kidney disease, stage 4 (severe): Secondary | ICD-10-CM | POA: Diagnosis not present

## 2022-03-08 DIAGNOSIS — N179 Acute kidney failure, unspecified: Secondary | ICD-10-CM | POA: Diagnosis not present

## 2022-03-08 DIAGNOSIS — R109 Unspecified abdominal pain: Secondary | ICD-10-CM | POA: Diagnosis not present

## 2022-03-08 DIAGNOSIS — I5032 Chronic diastolic (congestive) heart failure: Secondary | ICD-10-CM | POA: Diagnosis not present

## 2022-03-08 DIAGNOSIS — N189 Chronic kidney disease, unspecified: Secondary | ICD-10-CM

## 2022-03-08 DIAGNOSIS — R600 Localized edema: Secondary | ICD-10-CM | POA: Diagnosis not present

## 2022-03-08 DIAGNOSIS — Z992 Dependence on renal dialysis: Secondary | ICD-10-CM

## 2022-03-08 DIAGNOSIS — N201 Calculus of ureter: Secondary | ICD-10-CM | POA: Diagnosis not present

## 2022-03-08 DIAGNOSIS — N1330 Unspecified hydronephrosis: Secondary | ICD-10-CM | POA: Diagnosis not present

## 2022-03-08 DIAGNOSIS — Z515 Encounter for palliative care: Secondary | ICD-10-CM

## 2022-03-08 LAB — BASIC METABOLIC PANEL
Anion gap: 8 (ref 5–15)
BUN: 33 mg/dL — ABNORMAL HIGH (ref 8–23)
CO2: 29 mmol/L (ref 22–32)
Calcium: 7.8 mg/dL — ABNORMAL LOW (ref 8.9–10.3)
Chloride: 102 mmol/L (ref 98–111)
Creatinine, Ser: 3.14 mg/dL — ABNORMAL HIGH (ref 0.61–1.24)
GFR, Estimated: 19 mL/min — ABNORMAL LOW (ref >60)
Glucose, Bld: 141 mg/dL — ABNORMAL HIGH (ref 70–99)
Potassium: 3.8 mmol/L (ref 3.5–5.1)
Sodium: 139 mmol/L (ref 135–145)

## 2022-03-08 LAB — CBC
HCT: 32.6 % — ABNORMAL LOW (ref 39.0–52.0)
Hemoglobin: 10 g/dL — ABNORMAL LOW (ref 13.0–17.0)
MCH: 28 pg (ref 26.0–34.0)
MCHC: 30.7 g/dL (ref 30.0–36.0)
MCV: 91.3 fL (ref 80.0–100.0)
Platelets: 252 10*3/uL (ref 150–400)
RBC: 3.57 MIL/uL — ABNORMAL LOW (ref 4.22–5.81)
RDW: 13.8 % (ref 11.5–15.5)
WBC: 9.7 10*3/uL (ref 4.0–10.5)
nRBC: 0 % (ref 0.0–0.2)

## 2022-03-08 LAB — GLUCOSE, CAPILLARY
Glucose-Capillary: 215 mg/dL — ABNORMAL HIGH (ref 70–99)
Glucose-Capillary: 294 mg/dL — ABNORMAL HIGH (ref 70–99)
Glucose-Capillary: 74 mg/dL (ref 70–99)
Glucose-Capillary: 110 mg/dL — ABNORMAL HIGH (ref 70–99)
Glucose-Capillary: 191 mg/dL — ABNORMAL HIGH (ref 70–99)
Glucose-Capillary: 135 mg/dL — ABNORMAL HIGH (ref 70–99)

## 2022-03-08 MED ORDER — INSULIN ASPART 100 UNIT/ML IJ SOLN
8.0000 [IU] | Freq: Three times a day (TID) | INTRAMUSCULAR | Status: DC
  Administered 2022-03-08 – 2022-03-11 (×9): 8 [IU] via SUBCUTANEOUS
  Filled 2022-03-08 (×9): qty 1

## 2022-03-08 MED ORDER — INSULIN DETEMIR 100 UNIT/ML ~~LOC~~ SOLN
25.0000 [IU] | Freq: Every day | SUBCUTANEOUS | Status: DC
  Administered 2022-03-09 – 2022-03-11 (×3): 25 [IU] via SUBCUTANEOUS
  Filled 2022-03-08 (×3): qty 0.25

## 2022-03-08 NOTE — Assessment & Plan Note (Signed)
A1c 6.4, continue insulin Levemir to 32 units subcu daily and mealtime NovoLog to 12 units subcu 3 times daily.  Continue sliding scale

## 2022-03-08 NOTE — Progress Notes (Signed)
OT Cancellation Note  Patient Details Name: James Clarke MRN: [id number] DOB: 07/05/1941   Cancelled Treatment:    Reason Eval/Treat Not Completed: Patient declined politely, stating he has been up and moving throughout the day and is too tired at present. He also reports that is blood sugar is low (81) at the moment and he feels a bit dizzy. RN aware and in room. Pt requests that rehab services staff come back again tomorrow or as soon as possible and he will be happy to work with [place].   Josiah Lobo, PhD, MS, OTR/L 03/08/22, 4:15 PM

## 2022-03-08 NOTE — Assessment & Plan Note (Signed)
   Improved with dialysis.

## 2022-03-08 NOTE — Assessment & Plan Note (Signed)
Glans penis / urethral meatus Suspect mild yeast infection versus irritation due to Foley catheter, recent instrumentation  Treated with Diflucan and improved  Keep groin/penis dry as able.

## 2022-03-08 NOTE — Progress Notes (Signed)
Urology Inpatient Progress Note  Subjective: No acute events overnight.  Afebrile, VSS.  He remains on dialysis with temporary dialysis catheter removed yesterday, need for long-term dialysis TBD based on renal function over the weekend. He underwent inpatient voiding trial 2 days ago.  Afternoon PVR per nursing 0 mL. Urine culture from 03/01/2022 finalized with no growth.  Rocephin was stopped yesterday.  He reports some GI upset.  GI panel from 8/14 negative.  Anti-infectives: Anti-infectives (From admission, onward)    Start     Dose/Rate Route Frequency Ordered Stop   03/01/22 1430  fluconazole (DIFLUCAN) tablet 150 mg        150 mg Oral  Once 03/01/22 1339 03/01/22 1604   02/28/22 1300  cefTRIAXone (ROCEPHIN) 2 g in sodium chloride 0.9 % 100 mL IVPB  Status:  Discontinued        2 g 200 mL/hr over 30 Minutes Intravenous Every 24 hours 02/28/22 1208 03/07/22 1225       Current Facility-Administered Medications  Medication Dose Route Frequency Provider Last Rate Last Admin   acetaminophen (TYLENOL) tablet 650 mg  650 mg Oral Q6H PRN Para Skeans, MD       Or   acetaminophen (TYLENOL) suppository 650 mg  650 mg Rectal Q6H PRN Para Skeans, MD       albuterol (PROVENTIL) (2.5 MG/3ML) 0.083% nebulizer solution 2.5 mg  2.5 mg Inhalation Q6H PRN Para Skeans, MD       amLODipine (NORVASC) tablet 10 mg  10 mg Oral Daily Enzo Bi, MD   10 mg at 03/08/22 7026   aspirin EC tablet 81 mg  81 mg Oral Daily Para Skeans, MD   81 mg at 03/08/22 3785   Chlorhexidine Gluconate Cloth 2 % PADS 6 each  6 each Topical Q0600 Colon Flattery, NP   6 each at 03/07/22 8850   diphenhydrAMINE (BENADRYL) injection 25 mg  25 mg Intravenous Once in dialysis Colon Flattery, NP       heparin injection 5,000 Units  5,000 Units Subcutaneous Q8H Florina Ou V, MD   5,000 Units at 03/08/22 0559   hydrALAZINE (APRESOLINE) injection 10 mg  10 mg Intravenous Q4H PRN Para Skeans, MD       hydrALAZINE  (APRESOLINE) tablet 50 mg  50 mg Oral Q8H Enzo Bi, MD   50 mg at 03/08/22 0559   HYDROcodone-acetaminophen (NORCO/VICODIN) 5-325 MG per tablet 1 tablet  1 tablet Oral Q4H PRN Para Skeans, MD       insulin aspart (novoLOG) injection 0-15 Units  0-15 Units Subcutaneous TID WC Para Skeans, MD   5 Units at 03/08/22 0819   insulin aspart (novoLOG) injection 12 Units  12 Units Subcutaneous TID WC Max Sane, MD   12 Units at 03/08/22 0819   insulin detemir (LEVEMIR) injection 32 Units  32 Units Subcutaneous Daily Max Sane, MD   32 Units at 03/08/22 0820   loperamide (IMODIUM) capsule 4 mg  4 mg Oral PRN Emeterio Reeve, DO   4 mg at 03/07/22 1152   metoprolol succinate (TOPROL-XL) 24 hr tablet 50 mg  50 mg Oral Daily Para Skeans, MD   50 mg at 03/08/22 2774   nystatin (MYCOSTATIN/NYSTOP) topical powder   Topical BID Emeterio Reeve, DO   Given at 03/06/22 2034   oxybutynin (DITROPAN) tablet 5 mg  5 mg Oral Q8H PRN Max Sane, MD       polyethylene glycol (MIRALAX / GLYCOLAX)  packet 34 g  34 g Oral BID Enzo Bi, MD   34 g at 03/01/22 0831   sodium chloride flush (NS) 0.9 % injection 3 mL  3 mL Intravenous Q12H Florina Ou V, MD   3 mL at 03/08/22 0813   tamsulosin (FLOMAX) capsule 0.4 mg  0.4 mg Oral Daily Stoioff, Scott C, MD   0.4 mg at 03/08/22 2595   Objective: Vital signs in last 24 hours: Temp:  [97.7 F (36.5 C)-99.2 F (37.3 C)] 98 F (36.7 C) (08/18 0751) Pulse Rate:  [68-79] 76 (08/18 0751) Resp:  [12-21] 18 (08/18 0751) BP: (103-154)/(54-77) 140/73 (08/18 0751) SpO2:  [93 %-100 %] 96 % (08/18 0751)  Intake/Output from previous day: 08/17 0701 - 08/18 0700 In: 240 [P.O.:240] Out: 0.5  Intake/Output this shift: No intake/output data recorded.  Physical Exam Vitals and nursing note reviewed.  Constitutional:      General: He is not in acute distress.    Appearance: He is not ill-appearing, toxic-appearing or diaphoretic.  HENT:     Head: Atraumatic.   Pulmonary:     Effort: Pulmonary effort is normal. No respiratory distress.  Skin:    General: Skin is warm and dry.  Neurological:     Mental Status: He is alert and oriented to person, place, and time.  Psychiatric:        Mood and Affect: Mood normal.        Behavior: Behavior normal.    Lab Results:  Recent Labs    03/07/22 0404 03/08/22 0430  WBC 10.0 9.7  HGB 10.4* 10.0*  HCT 33.3* 32.6*  PLT 279 252   BMET Recent Labs    03/07/22 0404 03/08/22 0430  NA 142 139  K 3.5 3.8  CL 106 102  CO2 27 29  GLUCOSE 119* 141*  BUN 45* 33*  CREATININE 3.16* 3.14*  CALCIUM 8.0* 7.8*   Assessment & Plan: 81 year old male with CKD, diabetes, hypertension, and nephrolithiasis admitted with acute renal failure with 2 distal left ureteral stones, now s/p URS/LL/stent placement with Dr. Bernardo Heater on 02/27/2022.  Agree with discontinuing antibiotics in the setting of negative urine culture.    Inpatient voiding trial passed.  Consider repeat bladder scan if he develops difficulty voiding or sensation of incomplete emptying.    He will require cystoscopy stent removal with Dr. Bernardo Heater at the end of the month per his op note recommendations.  We will arrange.  Debroah Loop, PA-C 03/08/2022

## 2022-03-08 NOTE — Assessment & Plan Note (Signed)
Cr 5.61 on presentation.  Baseline around 2.5. AKI due to obstruction, from obstructing 2-3 mm calculi within the distal left ureter, and/or dehydration from home lasix and Valsartan use.  Nephrology and urology following  Status post 3 hemodialysis session through temporary dialysis catheter per nephrology.  Catheter removed on 8/17 and monitor renal function over the weekend to decide long-term dialysis need for nephrology  Lab Results  Component Value Date   CREATININE 3.14 (H) 03/08/2022   CREATININE 3.16 (H) 03/07/2022   CREATININE 3.61 (H) 03/06/2022

## 2022-03-08 NOTE — Progress Notes (Signed)
Pt was not feeling good, cold clammy and reported his blood sugar was low. RN checked pt's blood sugar and it dropped to 74. Pt remained alert and oriented, juice with cracker was offered. MD was informed and adjusted pt's blood sugar. Will recheck pt's blood sugar.

## 2022-03-08 NOTE — Progress Notes (Signed)
[facility]  PROGRESS NOTE   Subjective:   Patient seen sitting at side of bed. Wife at bedside States he feels better today since HD temp cath was removed Is able to ambulate to bathroom   Objective:  Vital signs: Blood pressure (!) 140/73, pulse 76, temperature 98 F (36.7 C), temperature source Oral, resp. rate 18, height '5\' 10"'$  (1.778 m), weight (!) 141.9 kg, SpO2 96 %.  Intake/Output Summary (Last 24 hours) at 03/08/2022 1238 Last data filed at 03/08/2022 1200 Gross per 24 hour  Intake 600 ml  Output 250 ml  Net 350 ml    Filed Weights   03/06/22 1115 03/07/22 0454 03/07/22 0830  Weight: (!) 142.1 kg (!) 141.9 kg (!) 141.9 kg     Physical Exam: General:  No acute distress  Head:  Normocephalic, atraumatic. Moist oral mucosal membranes  Eyes:  Anicteric  Lungs:   Clear to auscultation, normal effort  Heart:  S1S2 no rubs  Abdomen:   Soft, nontender, bowel sounds present  Extremities:  1+ peripheral edema.  Neurologic:  Awake, alert, following commands  Skin:  No lesions  Access: Right femoral HD temp cath removed on 08/14/56    Basic Metabolic Panel: Recent Labs  Lab 03/02/22 0600 03/03/22 0515 03/04/22 0400 03/05/22 0812 03/06/22 0420 03/07/22 0404 03/08/22 0430  NA 139 141 143 142 141 142 139  K 4.2 3.9 3.6 3.9 3.9 3.5 3.8  CL 110 113* 115* 114* 110 106 102  CO2 20* 21* 20* 18* '24 27 29  '$ GLUCOSE 199* 154* 131* 179* 217* 119* 141*  BUN 93* 90* 85* 78* 57* 45* 33*  CREATININE 5.50* 5.14* 4.81* 4.53* 3.61* 3.16* 3.14*  CALCIUM 7.9* 8.0* 8.2* 8.3* 8.0* 8.0* 7.8*  MG 2.2 2.1  --   --   --   --   --   PHOS  --   --   --   --   --  4.9*  --      CBC: Recent Labs  Lab 03/04/22 0400 03/07/22 0404 03/08/22 0430  WBC 9.0 10.0 9.7  HGB 10.3* 10.4* 10.0*  HCT 32.8* 33.3* 32.6*  MCV 89.9 90.7 91.3  PLT 316 279 252      Urinalysis: No results for input(s): "COLORURINE", "LABSPEC", "PHURINE", "GLUCOSEU", "HGBUR", "BILIRUBINUR",  "KETONESUR", "PROTEINUR", "UROBILINOGEN", "NITRITE", "LEUKOCYTESUR" in the last 72 hours.  Invalid input(s): "APPERANCEUR"    Imaging: No results found.   Medications:      amLODipine  10 mg Oral Daily   aspirin EC  81 mg Oral Daily   Chlorhexidine Gluconate Cloth  6 each Topical Q0600   diphenhydrAMINE  25 mg Intravenous Once in dialysis   heparin  5,000 Units Subcutaneous Q8H   hydrALAZINE  50 mg Oral Q8H   insulin aspart  0-15 Units Subcutaneous TID WC   insulin aspart  12 Units Subcutaneous TID WC   insulin detemir  32 Units Subcutaneous Daily   metoprolol succinate  50 mg Oral Daily   nystatin   Topical BID   polyethylene glycol  34 g Oral BID   sodium chloride flush  3 mL Intravenous Q12H   tamsulosin  0.4 mg Oral Daily    Assessment/ Plan:     Principal Problem:   Acute renal failure superimposed on stage 4 chronic kidney disease (HCC) Active Problems:   Type 2 diabetes mellitus with renal manifestations (HCC)   Sleep apnea   Hypertension   Bilateral leg edema   Left  ureteral calculus   Chronic diastolic CHF (congestive heart failure) (HCC)   Hydronephrosis of left kidney   UTI (urinary tract infection)   Skin irritation   Diarrhea  [name] is a 81 y.o.  male with past medical conditions including Bell's palsy, diabetes, hypertension, hyperlipidemia, sleep apnea on CPAP, and chronic kidney disease stage IV.  Patient presents to the emergency department at the advice of his nephrologist for abnormal renal function.  Patient has been admitted for Bilateral flank pain [R10.9] Suprapubic abdominal pain [R10.2] Abdominal pain [R10.9] Acute renal failure superimposed on chronic kidney disease, unspecified CKD stage, unspecified acute renal failure type ([place]) [N17.9, N18.9] AKI (acute kidney injury) ([place]) [N17.9]   #1: Acute kidney injury on CKD stage IV: Patient has a baseline creatinine of 3.3.  Patient is now at admitted with obstructive uropathy  secondary to nephrolithiasis.  Patient is status post ureteroscopy.  Patient received third treatment yesterday. Appreciate nursing removing HD temp cath. Will monitor patient this weekend for renal recovery. If further dialysis is needed, will contact vascular surgery for placement of permcath. Ordered strict I&O's and encouraged patient to use urinal when voiding to monitor output.    #2: Hypertension: Continue present antihypertensive treatment.  Blood pressure 140/73   #3: Diabetes mellitus type II with chronic kidney disease   glucose remains elevated at times.  Primary team has managed with sliding scale insulin.  Glucose stable   #4: Anemia with chronic kidney disease: Hemoglobin remains stable, 10.0.     #5: Congestive heart failure: We will continue 2 g salt restricted diet with 1 L fluid restriction orally.  Will monitor urine output.      LOS: [facility] 8/18/202312:38 PM

## 2022-03-08 NOTE — Progress Notes (Signed)
Progress Note   Patient: James Clarke DJS:[id number] DOB: Feb 06, 1941 DOA: 02/25/2022     10 DOS: the patient was seen and examined on 03/08/2022   Brief hospital course: James Clarke is a 81 y.o. male with past medical history of chronic kidney disease 4, hypertension, diabetes mellitus type 2, chronic CHF and bilateral leg edema (Echo 218 2018 may shows normal left ventricular cavity size systolic function normal ejection fraction between 50 to 55%), kidney stones, obstructive sleep apnea who presented 02/25/2022 with abdominal pain evaluated in the urgent care initially and was referred to the emergency room after being told that he has acute kidney injury. Marland Kitchen 08/07: BUN of 80 creatinine of 5.61 (baseline approx 2.5), GFR of 10, WBC 13.3, UA more than 300 protein and small hemoglobin with 0-5 RBCs and 0-5 WBCs. CT Abd/Pelv: stone at the left UVJ with accompanying hydronephrosis.  Renal US: unremarkable. Admitted for ARF on CKD3 suspected prerenal d/t obstructing 2-3 mm calculi within the distal left ureter, and dehydration from decreased po intake plus home lasix and Valsartan use. DVT study given edema but more likely amlodipine effect and hypoalbuminemia.  Marland Kitchen 08/08: seen by urology and nephrology. Echo done: LVEF 55-60 w/ G2 DD.  Marland Kitchen 08/09: Patient underwent left ureteroscopy  . 08/10: Cr 5.91, UA abnormal, started Rocephin for UTI pending cultures, will repeat urine lytes to confirm FeNa --> concern for intrinsic causes.  Nephrology recs: No need for dialysis at the moment. Marland Kitchen 08/11: Minimal improvement in Cr 5.74.  No growth on urine culture, continuing current IV antibiotics. Will recollect urine.  Patient also complaining of some discharge/irritation around urethra/glans, treating for yeast infection . 08/12: Cr 5.5.  . 08/13: Cr 5.14.  Marland Kitchen 08/14: Cr 4.8. Still pending UCx result from 08/11 recollection. Nephrology team is planning for temporary dialysis tomorrow. Pt reports continued loose stool  w/ episodes of stool incontinence, no fever, GI PCR negative. Foley d/c tomorrow for voiding trial per urology. Consult vascular surgery for placement of temp cath tomorrow for dialysis which should hopefully also help fluid overload since lasix unable to be provided d/t renal dysfunction.  Marland Kitchen 08/15: temporary dialysis catheter placed. Plan initiate HD for 3 consecutive days, underwent first session today, no fluid removed. Plan remove Foley in AM for voiding trial.  . 08/16: HD this AM.  Foley out . 8/17: Temp cath removed. Patient and wife requesting to stop antibiotics as they are afraid it is upsetting his stomach . 8/18: Urology follow-up today.  Await renal recovery over the weekend to determine long-term dialysis need for nephrology.    Appreciate specialist recommendations: . Urology - UCX showed no growth and repeat urine culture results are pending (specimen ordered and not sent) but question inflammatory response - request recommendations re: need for continued antibiotics   Assessment and Plan: * Acute renal failure superimposed on stage 4 chronic kidney disease (HCC) Cr 5.61 on presentation.  Baseline around 2.5. AKI due to obstruction, from obstructing 2-3 mm calculi within the distal left ureter, and/or dehydration from home lasix and Valsartan use.  Nephrology and urology following  Status post 3 hemodialysis session through temporary dialysis catheter per nephrology.  Catheter removed on 8/17 and monitor renal function over the weekend to decide long-term dialysis need for nephrology  Lab Results  Component Value Date   CREATININE 3.14 (H) 03/08/2022   CREATININE 3.16 (H) 03/07/2022   CREATININE 3.61 (H) 03/06/2022     Abdominal pain-resolved as of 03/03/2022 CT Abd/Pelv  showed Obstructing 2-3 mm calculi within the distal left ureter, however, pt's abdominal pain was across lower abdomen, unlike pt's prior episode of kidney stone pains.   Abdominal pain improved some with  BM.  Pain control  Foley in place  Urology following   Type 2 diabetes mellitus with renal manifestations (HCC) A1c 6.4, continue insulin Levemir to 32 units subcu daily and mealtime NovoLog to 12 units subcu 3 times daily.  Continue sliding scale  Diarrhea Seems most likely d/t antibiotics  Discontinued Rocephin.  Diarrhea has improved but he does report some stomach upset   Skin irritation Glans penis / urethral meatus Suspect mild yeast infection versus irritation due to Foley catheter, recent instrumentation  Treated with Diflucan and improved  Keep groin/penis dry as able.  UTI (urinary tract infection) Treated with Rocephin.  Stopped antibiotic.  Urine culture showing no growth  Hydronephrosis of left kidney Obstructing 2-3 mm calculi within the distal left ureter just proximal to the left ureterovesicular junction resulting in mild left hydronephrosis and moderate perinephric stranding.  urology L ureteroscopy 08/09  Continue oxybutynin, Flomax  Chronic diastolic CHF (congestive heart failure) (HCC)  Lasix on hold due to kidney failure  Temporary dialysis catheter removed on 03/07/2022.  Plan to monitor kidney function over the weekend to decide long-term dialysis need.  Left ureteral calculus s/p L ureteroscopy 08/09.  Outpatient urology follow-up.  Continue Flomax and oxybutynin 5 mg p.o. 3 times daily as needed  Bilateral leg edema  Improved with dialysis.  Hypertension  hold home lasix and Valsartan due to AKI  cont home Toprol  added hydralazine 50 mg Q8h due to elevated BP.  As needed hydralazine for systolic blood pressure more than 160  Sleep apnea CPAP per home setting        Subjective: Feeling much better with temporary dialysis catheter out and antibiotics stopped.  Wife at bedside  Physical Exam: Vitals:   03/07/22 1625 03/07/22 1921 03/08/22 0351 03/08/22 0751  BP: (!) 154/64 136/75 (!) 115/54 (!) 140/73  Pulse: 79 77 76 76   Resp: '18 18 20 18  '$ Temp: 97.7 F (36.5 C) 99.2 F (37.3 C) 98.1 F (36.7 C) 98 F (36.7 C)  TempSrc: Oral Oral Oral Oral  SpO2: 96% 93% 96% 96%  Weight:      Height:       81 year old morbidly obese male lying in the bed comfortably without any acute distress Lungs decreased breath sounds at the bases Cardiovascular regular rate and rhythm Abdomen soft, obese, benign Neuro alert and awake, nonfocal Psych normal mood and affect Data Reviewed:  Creatinine 3.14  Family Communication: Wife updated at bedside  Disposition: Status is: Inpatient Remains inpatient appropriate because: Monitoring of kidney function to decide need for dialysis for long-term   Planned Discharge Destination: Home    DVT prophylaxis-heparin Time spent: 35 minutes  Author: Max Sane, MD 03/08/2022 12:36 PM  For on call review [email or web address].

## 2022-03-08 NOTE — Assessment & Plan Note (Signed)
   hold home lasix and Valsartan due to AKI  cont home Toprol  added hydralazine 50 mg Q8h due to elevated BP.  As needed hydralazine for systolic blood pressure more than 160

## 2022-03-08 NOTE — Assessment & Plan Note (Signed)
   Lasix on hold due to kidney failure  Temporary dialysis catheter removed on 03/07/2022.  Plan to monitor kidney function over the weekend to decide long-term dialysis need.

## 2022-03-08 NOTE — Progress Notes (Signed)
PT Cancellation Note  Patient Details Name: James Clarke MRN: [id number] DOB: [birth year]   Cancelled Treatment:     PT attempt. RN at bedside with pt/pt's spouse. OT arrived during discussion. Pt endorses" I just don't feel good right now. I felt a lot better earlier. I did walked to BR several times by myself throughout the day." RN confirmed pt has been up and moves well. Acute PT will continue to follow and progress as able per current POC.    Willette Pa 03/08/2022, 4:13 PM

## 2022-03-08 NOTE — Assessment & Plan Note (Addendum)
s/p L ureteroscopy 08/09.  Outpatient urology follow-up.  Continue Flomax and oxybutynin 5 mg p.o. 3 times daily as needed

## 2022-03-08 NOTE — Assessment & Plan Note (Signed)
Treated with Rocephin.  Stopped antibiotic.  Urine culture showing no growth

## 2022-03-08 NOTE — Assessment & Plan Note (Signed)
Obstructing 2-3 mm calculi within the distal left ureter just proximal to the left ureterovesicular junction resulting in mild left hydronephrosis and moderate perinephric stranding.  urology L ureteroscopy 08/09  Continue oxybutynin, Flomax

## 2022-03-08 NOTE — Consult Note (Signed)
                                                                                 Consultation Note Date: 03/08/2022   Patient Name: James Clarke  DOB: 08/15/1940  MRN: [id number]  Age / Sex: 81 y.o., male  PCP: Feldpausch, Dale E, MD Referring Physician: Shah, Vipul, MD  Reason for Consultation: Establishing goals of care  HPI/Patient Profile: 81 y.o. male  with past medical history of CKD, type 2 diabetes, chronic CHF (bilateral lower extremity edema), chronic kidney stones s/p stent 02/27/22, and OSA (CPAP) admitted on 02/25/2022 by recommendation of urgent care with abdominal pain. Pt is being treated for AKI.   Clinical Assessment and Goals of Care: I have reviewed medical records including EPIC notes, labs and imaging, assessed the patient and then met with patient and his wife at bedside  to discuss diagnosis prognosis, GOC, EOL wishes, disposition and options.  I introduced Palliative Medicine as specialized medical care for people living with serious illness. It focuses on providing relief from the symptoms and stress of a serious illness. The goal is to improve quality of life for both the patient and the family.  We discussed a brief life review of the patient.  Patient has been married for almost 60 years. As far as functional and nutritional status patient and wife endorse complete independence with ADLs prior to these events.  Patient and wife are surprised by the significant medical issues that have arisen over the past 11 days of patient's hospitalization.  We discussed patient's current illness and what it means in the larger context of patient's on-going co-morbidities.  Risk and benefits of hemodialysis reviewed.  I attempted to elicit values and goals of care important to the patient.  Patient says he wants to get out of the hospital would like to get better rest.  He shares he is not light hemodialysis.  He says he does not mind it and just feels "washed out".  He reports  it does not make him feel significantly better.  When asked if he would like to continue with hemodialysis, he says he does not want to but he will do what he has to do in order to live.  His wife shares she would like for him to at least give hemodialysis another try before he makes a final decision to safety no longer wants to proceed with dialysis.  Patient became appropriately tearful and emotional when discussing hemodialysis.  Therapeutic silence and active listening provided for patient to share his thoughts and emotions regarding his current medical situation.  Education offered regarding concept specific to human mortality and the limitations of medical interventions to prolong life when the body begins to fail to thrive.  Family is facing treatment option decisions, advanced directive, and anticipatory care needs.  Ongoing discussions needed in regards to goals of care.  Patient seems clear that he does not want to proceed with hemodialysis but is also saying he will do it if that means he will live.  Goal clarification needed after dialysis vacation this weekend.   Discussed with patient/family the importance of continued conversation with family and the medical   providers regarding overall plan of care and treatment options, ensuring decisions are within the context of the patient's values and GOCs.    Questions and concerns were addressed. The family was encouraged to call with questions or concerns.   PMT will continue to follow the patient throughout his hospitalization.  PMT contact info given to patient and wife.  Please utilize [employer]ION as there is no in person PMT coverage over the weekend.  Primary Decision Maker PATIENT  Code Status/Advance Care Planning: Full code  Prognosis:   Unable to determine  Discharge Planning: To Be Determined  Primary Diagnoses: Present on Admission:  Type 2 diabetes mellitus with renal manifestations (HCC)  Sleep apnea  Hypertension   Bilateral leg edema  (Resolved) Abdominal pain  Chronic diastolic CHF (congestive heart failure) (HCC)  Acute renal failure superimposed on stage 4 chronic kidney disease (HCC)  Left ureteral calculus  Hydronephrosis of left kidney  UTI (urinary tract infection)  Skin irritation   Physical Exam Vitals reviewed.  Constitutional:      General: He is not in acute distress.    Appearance: He is obese. He is not ill-appearing.  HENT:     Head: Normocephalic and atraumatic.  Cardiovascular:     Rate and Rhythm: Normal rate.  Pulmonary:     Effort: Pulmonary effort is normal.  Abdominal:     Palpations: Abdomen is soft.  Skin:    General: Skin is warm and dry.  Neurological:     Mental Status: He is alert and oriented to person, place, and time.  Psychiatric:        Mood and Affect: Mood normal. Mood is not anxious or depressed.        Behavior: Behavior normal.     Palliative Assessment/Data: 60%     Thank you for this consult. Palliative medicine will continue to follow and assist holistically.   Time Total: 75 minutes Greater than 50%  of this time was spent counseling and coordinating care related to the above assessment and plan.  Signed by: Jordan Hawks, DNP, FNP-BC Palliative Medicine    Please contact Palliative Medicine Team phone at [phone] for questions and concerns.  For individual provider: See Shea Evans

## 2022-03-08 NOTE — Assessment & Plan Note (Signed)
CPAP per home setting

## 2022-03-08 NOTE — Assessment & Plan Note (Signed)
Seems most likely d/t antibiotics  Discontinued Rocephin.  Diarrhea has improved but he does report some stomach upset

## 2022-03-09 DIAGNOSIS — N179 Acute kidney failure, unspecified: Secondary | ICD-10-CM | POA: Diagnosis not present

## 2022-03-09 DIAGNOSIS — N184 Chronic kidney disease, stage 4 (severe): Secondary | ICD-10-CM | POA: Diagnosis not present

## 2022-03-09 LAB — CBC
HCT: 32.3 % — ABNORMAL LOW (ref 39.0–52.0)
Hemoglobin: 10.1 g/dL — ABNORMAL LOW (ref 13.0–17.0)
MCH: 28.6 pg (ref 26.0–34.0)
MCHC: 31.3 g/dL (ref 30.0–36.0)
MCV: 91.5 fL (ref 80.0–100.0)
Platelets: 247 10*3/uL (ref 150–400)
RBC: 3.53 MIL/uL — ABNORMAL LOW (ref 4.22–5.81)
RDW: 13.9 % (ref 11.5–15.5)
WBC: 9.2 10*3/uL (ref 4.0–10.5)
nRBC: 0 % (ref 0.0–0.2)

## 2022-03-09 LAB — BASIC METABOLIC PANEL
Anion gap: 10 (ref 5–15)
BUN: 41 mg/dL — ABNORMAL HIGH (ref 8–23)
CO2: 25 mmol/L (ref 22–32)
Calcium: 7.9 mg/dL — ABNORMAL LOW (ref 8.9–10.3)
Chloride: 101 mmol/L (ref 98–111)
Creatinine, Ser: 3.69 mg/dL — ABNORMAL HIGH (ref 0.61–1.24)
GFR, Estimated: 16 mL/min — ABNORMAL LOW (ref >60)
Glucose, Bld: 150 mg/dL — ABNORMAL HIGH (ref 70–99)
Potassium: 3.5 mmol/L (ref 3.5–5.1)
Sodium: 136 mmol/L (ref 135–145)

## 2022-03-09 LAB — GLUCOSE, CAPILLARY
Glucose-Capillary: 218 mg/dL — ABNORMAL HIGH (ref 70–99)
Glucose-Capillary: 287 mg/dL — ABNORMAL HIGH (ref 70–99)
Glucose-Capillary: 128 mg/dL — ABNORMAL HIGH (ref 70–99)
Glucose-Capillary: 92 mg/dL (ref 70–99)
Glucose-Capillary: 122 mg/dL — ABNORMAL HIGH (ref 70–99)

## 2022-03-09 NOTE — Progress Notes (Signed)
[facility]  PROGRESS NOTE   Subjective:   Patient laying quietly in bed Alert and oriented, somber Tolerating meals without nausea and vomiting States he felt well today until he was informed of lab results Voices his concern of decreased renal function  Creatinine 3.69 Urine output 1 L in preceding 24 hours  Objective:  Vital signs: Blood pressure 129/61, pulse 72, temperature 98.4 F (36.9 C), resp. rate 17, height '5\' 10"'$  (1.778 m), weight (!) 141 kg, SpO2 98 %.  Intake/Output Summary (Last 24 hours) at 03/09/2022 1406 Last data filed at 03/09/2022 1325 Gross per 24 hour  Intake 680 ml  Output 1350 ml  Net -670 ml    Filed Weights   03/07/22 0454 03/07/22 0830 03/09/22 0414  Weight: (!) 141.9 kg (!) 141.9 kg (!) 141 kg     Physical Exam: General:  No acute distress  Head:  Normocephalic, atraumatic. Moist oral mucosal membranes  Eyes:  Anicteric  Lungs:   Clear to auscultation, normal effort  Heart:  S1S2 no rubs  Abdomen:   Soft, nontender, bowel sounds present  Extremities:  1+ peripheral edema.  Neurologic:  Awake, alert, following commands  Skin:  No lesions  Access: None    Basic Metabolic Panel: Recent Labs  Lab 03/03/22 0515 03/04/22 0400 03/05/22 0812 03/06/22 0420 03/07/22 0404 03/08/22 0430 03/09/22 0417  NA 141   < > 142 141 142 139 136  K 3.9   < > 3.9 3.9 3.5 3.8 3.5  CL 113*   < > 114* 110 106 102 101  CO2 21*   < > 18* '24 27 29 25  '$ GLUCOSE 154*   < > 179* 217* 119* 141* 150*  BUN 90*   < > 78* 57* 45* 33* 41*  CREATININE 5.14*   < > 4.53* 3.61* 3.16* 3.14* 3.69*  CALCIUM 8.0*   < > 8.3* 8.0* 8.0* 7.8* 7.9*  MG 2.1  --   --   --   --   --   --   PHOS  --   --   --   --  4.9*  --   --    < > = values in this interval not displayed.     CBC: Recent Labs  Lab 03/04/22 0400 03/07/22 0404 03/08/22 0430 03/09/22 0417  WBC 9.0 10.0 9.7 9.2  HGB 10.3* 10.4* 10.0* 10.1*  HCT 32.8* 33.3* 32.6* 32.3*  MCV 89.9 90.7 91.3  91.5  PLT 316 279 252 247      Urinalysis: No results for input(s): "COLORURINE", "LABSPEC", "PHURINE", "GLUCOSEU", "HGBUR", "BILIRUBINUR", "KETONESUR", "PROTEINUR", "UROBILINOGEN", "NITRITE", "LEUKOCYTESUR" in the last 72 hours.  Invalid input(s): "APPERANCEUR"    Imaging: No results found.   Medications:      amLODipine  10 mg Oral Daily   aspirin EC  81 mg Oral Daily   Chlorhexidine Gluconate Cloth  6 each Topical Q0600   diphenhydrAMINE  25 mg Intravenous Once in dialysis   heparin  5,000 Units Subcutaneous Q8H   hydrALAZINE  50 mg Oral Q8H   insulin aspart  0-15 Units Subcutaneous TID WC   insulin aspart  8 Units Subcutaneous TID WC   insulin detemir  25 Units Subcutaneous Daily   metoprolol succinate  50 mg Oral Daily   nystatin   Topical BID   polyethylene glycol  34 g Oral BID   sodium chloride flush  3 mL Intravenous Q12H   tamsulosin  0.4 mg Oral Daily  Assessment/ Plan:     Principal Problem:   Acute renal failure superimposed on stage 4 chronic kidney disease (HCC) Active Problems:   Type 2 diabetes mellitus with renal manifestations (HCC)   Sleep apnea   Hypertension   Bilateral leg edema   Left ureteral calculus   Chronic diastolic CHF (congestive heart failure) (HCC)   Hydronephrosis of left kidney   UTI (urinary tract infection)   Skin irritation   Diarrhea  [name] is a 81 y.o.  male with past medical conditions including Bell's palsy, diabetes, hypertension, hyperlipidemia, sleep apnea on CPAP, and chronic kidney disease stage IV.  Patient presents to the emergency department at the advice of his nephrologist for abnormal renal function.  Patient has been admitted for Bilateral flank pain [R10.9] Suprapubic abdominal pain [R10.2] Abdominal pain [R10.9] Acute renal failure superimposed on chronic kidney disease, unspecified CKD stage, unspecified acute renal failure type ([place]) [N17.9, N18.9] AKI (acute kidney injury) ([place])  [N17.9]   #1: Acute kidney injury on CKD stage IV: Patient has a baseline creatinine of 3.3.  Patient is now at admitted with obstructive uropathy secondary to nephrolithiasis.  Patient is status post ureteroscopy.  Patient received 3 dialysis treatments.  Temp cath removed and monitoring renal recovery through the weekend.  Creatinine increased today, not unexpected after dialysis.  Will await labs in a.m.  We will continue to monitor creatinine trend and determine if additional dialysis is needed   #2: Hypertension: Continue present antihypertensive treatment.  Blood pressure stable, 129/61.   #3: Diabetes mellitus type II with chronic kidney disease   glucose remains elevated at times.  Primary team has managed with sliding scale insulin.   #4: Anemia with chronic kidney disease: Hemoglobin remains stable, 10.1 today.   #5: Congestive heart failure: Fluid management temporarily maintained with dialysis.  Patient making adequate urine output without diuresis.  We will continue to monitor.     LOS: [facility] 8/19/20232:06 PM

## 2022-03-09 NOTE — Progress Notes (Addendum)
Occupational Therapy Treatment Patient Details Name: James Clarke MRN: [id number] DOB: 1941-07-13 Today's Date: 03/09/2022   History of present illness James Clarke. James Clarke is an 81 y/o male admitted for acute hospitalization secondary to acute abdominal, back pain; admitted for management of AKI secondary to obstructive stone, s/p ureteroscopy with laser lithotripsy with stent placement (02/27/22).   OT comments  James Clarke made good effort today and appeared much stronger and steadier than during earlier OT sessions. Today he is able to complete bed mobility with SUPV/Min A, can perform grooming tasks in standing w/ no UE support and no LOB, can get up and down from toilet with CGA and heavy reliance on grab bars, was able to ambulate ~ 200' in hallway, using RW and needed several standing rest breaks, but displaying no LOB. Pt and wife demonstrate good safety awareness, understand home modifications that can reduce risk of falls. DC with HHOT remains appropriate.    Recommendations for follow up therapy are one component of a multi-disciplinary discharge planning process, led by the attending physician.  Recommendations may be updated based on patient status, additional functional criteria and insurance authorization.    Follow Up Recommendations  Home health OT    Assistance Recommended at Discharge Intermittent Supervision/Assistance  Patient can return home with the following  A little help with walking and/or transfers;A little help with bathing/dressing/bathroom;Assistance with cooking/housework;Assist for transportation;Help with stairs or ramp for entrance   Equipment Recommendations       Recommendations for Other Services      Precautions / Restrictions Precautions Precautions: Fall Restrictions Weight Bearing Restrictions: No       Mobility Bed Mobility Overal bed mobility: Needs Assistance Bed Mobility: Supine to Sit, Sit to Supine     Supine to sit: Min assist Sit to  supine: Supervision        Transfers Overall transfer level: Needs assistance Equipment used: Rolling walker (2 wheels) Transfers: Sit to/from Stand Sit to Stand: Min guard                 Balance Overall balance assessment: Needs assistance Sitting-balance support: No upper extremity supported, Feet supported Sitting balance-Leahy Scale: Good     Standing balance support: Bilateral upper extremity supported, No upper extremity supported Standing balance-Leahy Scale: Good Standing balance comment: RW for ambulating in hallway; no UE support for standing grooming                           ADL either performed or assessed with clinical judgement   ADL Overall ADL's : Needs assistance/impaired Eating/Feeding: Independent   Grooming: Wash/dry hands;Wash/dry face;Standing;Oral care;Applying deodorant;Supervision/safety Grooming Details (indicate cue type and reason): able to complete with no UE support for maintaining balance         Upper Body Dressing : Sitting;Minimal assistance Upper Body Dressing Details (indicate cue type and reason): Requires assist for threading R UE into gown     Toilet Transfer: Comfort height toilet;Min guard;Rolling walker (2 wheels);Grab bars   Toileting- Clothing Manipulation and Hygiene: Sit to/from stand;Min guard              Extremity/Trunk Assessment Upper Extremity Assessment Upper Extremity Assessment: RUE deficits/detail;Overall [facility] for tasks assessed RUE Deficits / Details: Pt reports RUE deficits from shoulder surgery 1 yr ago, AROM limited to <90 deg   Lower Extremity Assessment Lower Extremity Assessment: Overall WFL for tasks assessed        Vision  Perception     Praxis      Cognition Arousal/Alertness: Awake/alert Behavior During Therapy: WFL for tasks assessed/performed Overall Cognitive Status: Within Functional Limits for tasks assessed                                           Exercises Other Exercises Other Exercises: Educ re: POC, DC recs, home modifications, falls prevention    Shoulder Instructions       General Comments      Pertinent Vitals/ Pain       Pain Assessment Faces Pain Scale: Hurts a little bit Pain Location: R shoulder Pain Descriptors / Indicators: Aching, Grimacing, Guarding Pain Intervention(s): Repositioned, Limited activity within patient's tolerance  Home Living                                          Prior Functioning/Environment              Frequency  Min 2X/week        Progress Toward Goals  OT Goals(current goals can now be found in the care plan section)  Progress towards OT goals: Progressing toward goals  Acute Rehab OT Goals OT Goal Formulation: With patient/family Time For Goal Achievement: 03/18/22 Potential to Achieve Goals: Good  Plan Discharge plan remains appropriate;Frequency remains appropriate    Co-evaluation                 AM-PAC OT "6 Clicks" Daily Activity     Outcome Measure   Help from another person eating meals?: None Help from another person taking care of personal grooming?: A Little Help from another person toileting, which includes using toliet, bedpan, or urinal?: A Little Help from another person bathing (including washing, rinsing, drying)?: A Lot Help from another person to put on and taking off regular upper body clothing?: A Little Help from another person to put on and taking off regular lower body clothing?: A Little 6 Click Score: 18    End of Session Equipment Utilized During Treatment: Rolling walker (2 wheels)  OT Visit Diagnosis: Unsteadiness on feet (R26.81);Muscle weakness (generalized) (M62.81)   Activity Tolerance Patient tolerated treatment well   Patient Left in bed;with call bell/phone within reach;with bed alarm set;with family/visitor present   Nurse Communication          Time: 8546-2703 OT Time  Calculation (min): 28 min  Charges: OT General Charges $OT Visit: 1 Visit OT Treatments $Self Care/Home Management : 23-37 mins Josiah Lobo, PhD, MS, OTR/L 03/09/22, 12:05 PM

## 2022-03-09 NOTE — Inpatient Diabetes Management (Signed)
Inpatient Diabetes Program Recommendations  AACE/ADA: New Consensus Statement on Inpatient Glycemic Control (2015)  Target Ranges:  Prepandial:   less than 140 mg/dL      Peak postprandial:   less than 180 mg/dL (1-2 hours)      Critically ill patients:  140 - 180 mg/dL   Lab Results  Component Value Date   GLUCAP 287 (H) 03/09/2022   HGBA1C 6.4 (H) 02/25/2022    Latest Reference Range & Units 03/08/22 07:52 03/08/22 11:10 03/08/22 15:28 03/08/22 16:28 03/08/22 19:57 03/08/22 21:26 03/09/22 07:56 03/09/22 11:54  Glucose-Capillary 70 - 99 mg/dL 215 (H)  Novolog 17 units  Levemir 32 units 294 (H)  Novolog 20 units 74 110 (H)  Novolog 8 units given at 1801 191 (H) 135 (H) 218 (H)  Novolog 13 units  Levemir 25 units 287 (H)  Novolog 16 units    Home DM Meds: Levemir 50 units daily                              Novolog 30 units w/ Breakfast/ 30 units with Lunch/ 55 units with Dinner     Current Orders: Levemir 25 units daily  Novolog 0-15 units TID  Novolog 8 units TID with meals     Note Glucose dropped to 74 most likely due to large Novolog doses  MD- Please consider:   1. Increase Levemir back up to 30 units Daily    Thanks, Tama Headings RN, MSN, BC-ADM Inpatient Diabetes Coordinator Team Pager [phone] (8a-5p)

## 2022-03-09 NOTE — Progress Notes (Signed)
PROGRESS NOTE    James Clarke  DGU:[id number] DOB: [birth year] DOA: 02/25/2022 PCP: Sofie Hartigan, MD   Brief Narrative:  James Clarke is a 81 y.o. male with past medical history of chronic kidney disease 4, hypertension, diabetes mellitus type 2, chronic CHF and bilateral leg edema (Echo 218 2018 may shows normal left ventricular cavity size systolic function normal ejection fraction between 33 to 55%), kidney stones, obstructive sleep apnea who presented 02/25/2022 with abdominal pain evaluated in the urgent care initially and was referred to the emergency room after being told that he has acute kidney injury. 08/07: BUN of 80 creatinine of 5.61 (baseline approx 2.5), GFR of 10, WBC 13.3, UA more than 300 protein and small hemoglobin with 0-5 RBCs and 0-5 WBCs. CT Abd/Pelv: stone at the left UVJ with accompanying hydronephrosis.  Renal US: unremarkable. Admitted for ARF on CKD3 suspected prerenal d/t obstructing 2-3 mm calculi within the distal left ureter, and dehydration from decreased po intake plus home lasix and Valsartan use. DVT study given edema but more likely amlodipine effect and hypoalbuminemia.  08/08: seen by urology and nephrology. Echo done: LVEF 55-60 w/ G2 DD.  08/09: Patient underwent left ureteroscopy  08/10: Cr 5.91, UA abnormal, started Rocephin for UTI pending cultures, will repeat urine lytes to confirm FeNa --> concern for intrinsic causes.  Nephrology recs: No need for dialysis at the moment. 08/11: Minimal improvement in Cr 5.74.  No growth on urine culture, continuing current IV antibiotics. Will recollect urine.  Patient also complaining of some discharge/irritation around urethra/glans, treating for yeast infection 08/12: Cr 5.5.  08/13: Cr 5.14.  08/14: Cr 4.8. Still pending UCx result from 08/11 recollection. Nephrology team is planning for temporary dialysis tomorrow. Pt reports continued loose stool w/ episodes of stool incontinence, no fever, GI PCR  negative. Foley d/c tomorrow for voiding trial per urology. Consult vascular surgery for placement of temp cath tomorrow for dialysis which should hopefully also help fluid overload since lasix unable to be provided d/t renal dysfunction.  08/15: temporary dialysis catheter placed. Plan initiate HD for 3 consecutive days, underwent first session today, no fluid removed. Plan remove Foley in AM for voiding trial.  08/16: HD this AM.  Foley out 8/17: Temp cath removed. Patient and wife requesting to stop antibiotics as they are afraid it is upsetting his stomach 8/18-8/19: Urology follow-up today.  Await renal recovery over the weekend to determine long-term dialysis need for nephrology.    Appreciate specialist recommendations: Urology - UCX showed no growth and repeat urine culture results are pending (specimen ordered and not sent) but question inflammatory response - request recommendations re: need for continued antibiotics     Assessment & Plan:   Principal Problem:   Acute renal failure superimposed on stage 4 chronic kidney disease ([place]) Active Problems:   Type 2 diabetes mellitus with renal manifestations ([place])   Sleep apnea   Hypertension   Bilateral leg edema   Left ureteral calculus   Chronic diastolic CHF (congestive heart failure) ([place])   Hydronephrosis of left kidney   UTI (urinary tract infection)   Skin irritation   Diarrhea  Assessment and Plan:   Acute renal failure superimposed on stage 4 chronic kidney disease (HCC) Cr 5.61 on presentation.  Baseline around 2.5. AKI due to obstruction, from obstructing 2-3 mm calculi within the distal left ureter, and/or dehydration from home lasix and Valsartan use. Nephrology and urology following Status post 3 hemodialysis session through temporary dialysis  catheter per nephrology.  Catheter removed on 8/17 and monitor renal function over the weekend to decide long-term dialysis need for nephrology Follow-up creatinine trend  over the weekend and determine need for further dialysis by 8/21 per Nephrology UO 1 L in last 24 hours     Abdominal pain-resolved as of 03/03/2022 CT Abd/Pelv showed Obstructing 2-3 mm calculi within the distal left ureter, however, pt's abdominal pain was across lower abdomen, unlike pt's prior episode of kidney stone pains.   Abdominal pain improved some with BM. Pain control Urology recommending cystoscopy stent removal at the end of the month which will be arranged for follow-up   Type 2 diabetes mellitus with renal manifestations (H[facility]) A1c 6.4, continue insulin Levemir to 32 units subcu daily and mealtime NovoLog to 12 units subcu 3 times daily.  Continue sliding scale -Labile blood glucose control, appreciate diabetes coordinator recommendations   Diarrhea Seems most likely d/t antibiotics Discontinued Rocephin.  Diarrhea has improved but he does report some stomach upset     Skin irritation Glans penis / urethral meatus Suspect mild yeast infection versus irritation due to Foley catheter, recent instrumentation Treated with Diflucan and improved Keep groin/penis dry as able.   UTI (urinary tract infection) Treated with Rocephin.  Stopped antibiotic.  Urine culture showing no growth   Hydronephrosis of left kidney Obstructing 2-3 mm calculi within the distal left ureter just proximal to the left ureterovesicular junction resulting in mild left hydronephrosis and moderate perinephric stranding. urology L ureteroscopy 08/09 Continue oxybutynin, Flomax   Chronic diastolic CHF (congestive heart failure) (HCC) Lasix on hold due to kidney failure Temporary dialysis catheter removed on 03/07/2022.  Plan to monitor kidney function over the weekend to decide long-term dialysis need.   Left ureteral calculus s/p L ureteroscopy 08/09.  Outpatient urology follow-up.  Continue Flomax and oxybutynin 5 mg p.o. 3 times daily as needed   Bilateral leg edema Improved with dialysis, but  is persistent   Hypertension-stable hold home lasix and Valsartan due to AKI cont home Toprol added hydralazine 50 mg Q8h due to elevated BP.  As needed hydralazine for systolic blood pressure more than 160   Sleep apnea CPAP per home setting  Morbid obesity BMI 44.60 Lifestyle changes outpatient     DVT prophylaxis: Heparin Code Status: Full Family Communication: Wife at bedside 8/19 Disposition Plan: Follow creatinine trend to determine further need for hemodialysis by 8/21 Status is: Inpatient Remains inpatient appropriate because: Need for IV medications and possibly reinitiation of hemodialysis by 8/21.   Consultants:  Nephrology Palliative care  Procedures:  See above  Antimicrobials:  Anti-infectives (From admission, onward)    Start     Dose/Rate Route Frequency Ordered Stop   03/01/22 1430  fluconazole (DIFLUCAN) tablet 150 mg        150 mg Oral  Once 03/01/22 1339 03/01/22 1604   02/28/22 1300  cefTRIAXone (ROCEPHIN) 2 g in sodium chloride 0.9 % 100 mL IVPB  Status:  Discontinued        2 g 200 mL/hr over 30 Minutes Intravenous Every 24 hours 02/28/22 1208 03/07/22 1225       Subjective: Patient seen and evaluated today with no new acute complaints or concerns. No acute concerns or events noted overnight.  He has had some labile blood glucose control noted.  He is eager to get up and try and walk.  He is hopeful that he will not require further hemodialysis during this admission.  Objective: Vitals:  03/08/22 1955 03/09/22 0407 03/09/22 0414 03/09/22 0754  BP: 126/70 131/62  129/61  Pulse: 75 65  72  Resp: '16 20  17  '$ Temp: 98.9 F (37.2 C) 98.4 F (36.9 C)  98.4 F (36.9 C)  TempSrc:  Oral    SpO2: 98% 95%  98%  Weight:   (!) 141 kg   Height:        Intake/Output Summary (Last 24 hours) at 03/09/2022 1110 Last data filed at 03/09/2022 1056 Gross per 24 hour  Intake 680 ml  Output 1200 ml  Net -520 ml   Filed Weights   03/07/22 0454  03/07/22 0830 03/09/22 0414  Weight: (!) 141.9 kg (!) 141.9 kg (!) 141 kg    Examination:  General exam: Appears calm and comfortable, obese Respiratory system: Clear to auscultation. Respiratory effort normal. Cardiovascular system: S1 & S2 heard, RRR.  Gastrointestinal system: Abdomen is soft Central nervous system: Alert and awake Extremities: Bilateral edema Skin: No significant lesions noted Psychiatry: Flat affect.    Data Reviewed: I have personally reviewed following labs and imaging studies  CBC: Recent Labs  Lab 03/04/22 0400 03/07/22 0404 03/08/22 0430 03/09/22 0417  WBC 9.0 10.0 9.7 9.2  HGB 10.3* 10.4* 10.0* 10.1*  HCT 32.8* 33.3* 32.6* 32.3*  MCV 89.9 90.7 91.3 91.5  PLT 316 279 252 509   Basic Metabolic Panel: Recent Labs  Lab 03/03/22 0515 03/04/22 0400 03/05/22 0812 03/06/22 0420 03/07/22 0404 03/08/22 0430 03/09/22 0417  NA 141   < > 142 141 142 139 136  K 3.9   < > 3.9 3.9 3.5 3.8 3.5  CL 113*   < > 114* 110 106 102 101  CO2 21*   < > 18* '24 27 29 25  '$ GLUCOSE 154*   < > 179* 217* 119* 141* 150*  BUN 90*   < > 78* 57* 45* 33* 41*  CREATININE 5.14*   < > 4.53* 3.61* 3.16* 3.14* 3.69*  CALCIUM 8.0*   < > 8.3* 8.0* 8.0* 7.8* 7.9*  MG 2.1  --   --   --   --   --   --   PHOS  --   --   --   --  4.9*  --   --    < > = values in this interval not displayed.   GFR: Estimated Creatinine Clearance: 22.3 mL/min (A) (by C-G formula based on SCr of 3.69 mg/dL (H)). Liver Function Tests: Recent Labs  Lab 03/07/22 0404  ALBUMIN 2.7*   No results for input(s): "LIPASE", "AMYLASE" in the last 168 hours. No results for input(s): "AMMONIA" in the last 168 hours. Coagulation Profile: No results for input(s): "INR", "PROTIME" in the last 168 hours. Cardiac Enzymes: No results for input(s): "CKTOTAL", "CKMB", "CKMBINDEX", "TROPONINI" in the last 168 hours. BNP (last 3 results) No results for input(s): "PROBNP" in the last 8760 hours. HbA1C: No  results for input(s): "HGBA1C" in the last 72 hours. CBG: Recent Labs  Lab 03/08/22 1528 03/08/22 1628 03/08/22 1957 03/08/22 2126 03/09/22 0756  GLUCAP 74 110* 191* 135* 218*   Lipid Profile: No results for input(s): "CHOL", "HDL", "LDLCALC", "TRIG", "CHOLHDL", "LDLDIRECT" in the last 72 hours. Thyroid Function Tests: No results for input(s): "TSH", "T4TOTAL", "FREET4", "T3FREE", "THYROIDAB" in the last 72 hours. Anemia Panel: No results for input(s): "VITAMINB12", "FOLATE", "FERRITIN", "TIBC", "IRON", "RETICCTPCT" in the last 72 hours. Sepsis Labs: No results for input(s): "PROCALCITON", "LATICACIDVEN" in the last 168 hours.  Recent Results (from the past 240 hour(s))  Urine Culture     Status: None   Collection Time: 02/28/22 12:50 PM   Specimen: Urine, Catheterized  Result Value Ref Range Status   Specimen Description   Final    URINE, CATHETERIZED Performed at [facility], [address] [place], [place] [address]    Special Requests   Final    NONE Performed at [facility], [address], [place], [place] [address]    Culture   Final    NO GROWTH Performed at [facility], [place] [address], [place], [place] [address]    Report Status 03/01/2022 FINAL  Final  Gastrointestinal Panel by PCR , Stool     Status: None   Collection Time: 03/04/22  1:18 PM   Specimen: Stool  Result Value Ref Range Status   Campylobacter species NOT DETECTED NOT DETECTED Final   Plesimonas shigelloides NOT DETECTED NOT DETECTED Final   Salmonella species NOT DETECTED NOT DETECTED Final   Yersinia enterocolitica NOT DETECTED NOT DETECTED Final   Vibrio species NOT DETECTED NOT DETECTED Final   Vibrio cholerae NOT DETECTED NOT DETECTED Final   Enteroaggregative E coli (EAEC) NOT DETECTED NOT DETECTED Final   Enteropathogenic E coli (EPEC) NOT DETECTED NOT DETECTED Final   Enterotoxigenic E coli (ETEC) NOT DETECTED NOT DETECTED Final   Shiga like toxin  producing E coli (STEC) NOT DETECTED NOT DETECTED Final   Shigella/Enteroinvasive E coli (EIEC) NOT DETECTED NOT DETECTED Final   Cryptosporidium NOT DETECTED NOT DETECTED Final   Cyclospora cayetanensis NOT DETECTED NOT DETECTED Final   Entamoeba histolytica NOT DETECTED NOT DETECTED Final   Giardia lamblia NOT DETECTED NOT DETECTED Final   Adenovirus F40/41 NOT DETECTED NOT DETECTED Final   Astrovirus NOT DETECTED NOT DETECTED Final   Norovirus GI/GII NOT DETECTED NOT DETECTED Final   Rotavirus A NOT DETECTED NOT DETECTED Final   Sapovirus (I, II, IV, and V) NOT DETECTED NOT DETECTED Final    Comment: Performed at [facility], [address], [place], [place] [address]         Radiology Studies: No results found.      Scheduled Meds:  amLODipine  10 mg Oral Daily   aspirin EC  81 mg Oral Daily   Chlorhexidine Gluconate Cloth  6 each Topical Q0600   diphenhydrAMINE  25 mg Intravenous Once in dialysis   heparin  5,000 Units Subcutaneous Q8H   hydrALAZINE  50 mg Oral Q8H   insulin aspart  0-15 Units Subcutaneous TID WC   insulin aspart  8 Units Subcutaneous TID WC   insulin detemir  25 Units Subcutaneous Daily   metoprolol succinate  50 mg Oral Daily   nystatin   Topical BID   polyethylene glycol  34 g Oral BID   sodium chloride flush  3 mL Intravenous Q12H   tamsulosin  0.4 mg Oral Daily     LOS: 11 days    Time spent: 35 minutes    Cyndel Griffey Darleen Crocker, DO [facility]  If 7PM-7AM, please contact night-coverage [email or web address] 03/09/2022, 11:10 AM

## 2022-03-10 DIAGNOSIS — N139 Obstructive and reflux uropathy, unspecified: Secondary | ICD-10-CM | POA: Diagnosis present

## 2022-03-10 DIAGNOSIS — N1330 Unspecified hydronephrosis: Secondary | ICD-10-CM | POA: Diagnosis not present

## 2022-03-10 DIAGNOSIS — I5032 Chronic diastolic (congestive) heart failure: Secondary | ICD-10-CM | POA: Diagnosis not present

## 2022-03-10 DIAGNOSIS — N184 Chronic kidney disease, stage 4 (severe): Secondary | ICD-10-CM | POA: Diagnosis not present

## 2022-03-10 DIAGNOSIS — N179 Acute kidney failure, unspecified: Secondary | ICD-10-CM | POA: Diagnosis not present

## 2022-03-10 LAB — GLUCOSE, CAPILLARY
Glucose-Capillary: 190 mg/dL — ABNORMAL HIGH (ref 70–99)
Glucose-Capillary: 323 mg/dL — ABNORMAL HIGH (ref 70–99)
Glucose-Capillary: 203 mg/dL — ABNORMAL HIGH (ref 70–99)
Glucose-Capillary: 174 mg/dL — ABNORMAL HIGH (ref 70–99)

## 2022-03-10 LAB — BASIC METABOLIC PANEL
Anion gap: 7 (ref 5–15)
BUN: 46 mg/dL — ABNORMAL HIGH (ref 8–23)
CO2: 27 mmol/L (ref 22–32)
Calcium: 8 mg/dL — ABNORMAL LOW (ref 8.9–10.3)
Chloride: 105 mmol/L (ref 98–111)
Creatinine, Ser: 3.6 mg/dL — ABNORMAL HIGH (ref 0.61–1.24)
GFR, Estimated: 16 mL/min — ABNORMAL LOW (ref >60)
Glucose, Bld: 154 mg/dL — ABNORMAL HIGH (ref 70–99)
Potassium: 4 mmol/L (ref 3.5–5.1)
Sodium: 139 mmol/L (ref 135–145)

## 2022-03-10 LAB — MAGNESIUM: Magnesium: 1.8 mg/dL (ref 1.7–2.4)

## 2022-03-10 NOTE — Assessment & Plan Note (Addendum)
A1c 6.4, - continue insulin Levemir to 25 units subcu daily -continue mealtime NovoLog to 8 units subcu 3 times daily.   -Continue sliding scale

## 2022-03-10 NOTE — Progress Notes (Signed)
Physical Therapy Treatment Patient Details Name: Avraj Lindroth MRN: [id number] DOB: 02/25/1941 Today's Date: 03/10/2022   History of Present Illness Blase Beckner. Carneiro is an 81 y/o male admitted for acute hospitalization secondary to acute abdominal, back pain; admitted for management of AKI secondary to obstructive stone, s/p ureteroscopy with laser lithotripsy with stent placement (02/27/22).    PT Comments    Pt received in Semi-Fowler's position and agreeable to therapy.  Initially pt did not want to get up as he noted that he had already been up and walked to the nursing station.  With encouragement, pt agreed to increased ambulation before dinner arrived.  Pt performed well with treatment and is able to ambulate 2 laps around the nursing station with the final 1/2 of the last lap utilizing only the hand rail in the hallway to ambulate.  Pt does experience some SOB, but SpO2 remained >95% throughout.  Pt could benefit from more challenging balance related tasks and endurance training while here for hospital stay.  Pt and wife educated on HHPT and recommendation for HHPT in order to improve overall mobility and QoL.  Pt and wife agreeable.       Recommendations for follow up therapy are one component of a multi-disciplinary discharge planning process, led by the attending physician.  Recommendations may be updated based on patient status, additional functional criteria and insurance authorization.  Follow Up Recommendations  Home health PT     Assistance Recommended at Discharge PRN  Patient can return home with the following A little help with walking and/or transfers;A little help with bathing/dressing/bathroom   Equipment Recommendations  Rolling walker (2 wheels);Other (comment)    Recommendations for Other Services       Precautions / Restrictions Precautions Precautions: Fall Restrictions Weight Bearing Restrictions: No     Mobility  Bed Mobility Overal bed mobility: Needs  Assistance Bed Mobility: Supine to Sit     Supine to sit: Min assist     General bed mobility comments: pt left sitting EOB    Transfers Overall transfer level: Needs assistance Equipment used: Rolling walker (2 wheels) Transfers: Sit to/from Stand Sit to Stand: Min guard                Ambulation/Gait Ambulation/Gait assistance: Min guard, Supervision Gait Distance (Feet): 320 Feet Assistive device: Rolling walker (2 wheels) Gait Pattern/deviations: WFL(Within Functional Limits) Gait velocity: slightly decreased     General Gait Details: mild forward flexed posture; good step height/length; decreased cadence, but no buckling or LOB. Mild SOB, sats >95% on RA throughout distance   Stairs             Wheelchair Mobility    Modified Rankin (Stroke Patients Only)       Balance Overall balance assessment: Needs assistance Sitting-balance support: No upper extremity supported, Feet supported Sitting balance-Leahy Scale: Good     Standing balance support: Bilateral upper extremity supported, No upper extremity supported Standing balance-Leahy Scale: Good Standing balance comment: RW for ambulating in hallway; no UE support for standing grooming                            Cognition Arousal/Alertness: Awake/alert Behavior During Therapy: WFL for tasks assessed/performed Overall Cognitive Status: Within Functional Limits for tasks assessed  Exercises      General Comments        Pertinent Vitals/Pain Pain Assessment Pain Assessment: Faces Faces Pain Scale: Hurts a little bit Pain Location: R shoulder Pain Descriptors / Indicators: Aching, Grimacing, Guarding Pain Intervention(s): Limited activity within patient's tolerance, Monitored during session    Home Living                          Prior Function            PT Goals (current goals can now be found in the  care plan section) Acute Rehab PT Goals Patient Stated Goal: to return home PT Goal Formulation: With patient/family Time For Goal Achievement: 03/17/22 Potential to Achieve Goals: Good Progress towards PT goals: Progressing toward goals    Frequency    Min 2X/week      PT Plan Current plan remains appropriate    Co-evaluation              AM-PAC PT "6 Clicks" Mobility   Outcome Measure  Help needed turning from your back to your side while in a flat bed without using bedrails?: None Help needed moving from lying on your back to sitting on the side of a flat bed without using bedrails?: A Little Help needed moving to and from a bed to a chair (including a wheelchair)?: A Little Help needed standing up from a chair using your arms (e.g., wheelchair or bedside chair)?: A Little Help needed to walk in hospital room?: A Little Help needed climbing 3-5 steps with a railing? : A Little 6 Click Score: 19    End of Session   Activity Tolerance: Patient tolerated treatment well Patient left: in bed;with bed alarm set;with call bell/phone within reach;with family/visitor present Nurse Communication: Mobility status PT Visit Diagnosis: Muscle weakness (generalized) (M62.81);Difficulty in walking, not elsewhere classified (R26.2)     Time: 0569-7948 PT Time Calculation (min) (ACUTE ONLY): 18 min  Charges:  $Therapeutic Activity: 8-22 mins                     Gwenlyn Saran, PT, DPT 03/10/22, 6:03 PM

## 2022-03-10 NOTE — Progress Notes (Signed)
[facility]  PROGRESS NOTE   Subjective:   Patient seen resting in bed, wife at bedside. Patient states he is able to ambulate in room, bathroom privileges. Reports reduced weakness Appetite has improved greatly Lower extremity edema appears worse than yesterday  Creatinine 3.60 Urine output 1.3 L in preceding 24 hours  Objective:  Vital signs: Blood pressure 139/62, pulse 66, temperature (!) 97.5 F (36.4 C), temperature source Oral, resp. rate 18, height '5\' 10"'$  (1.778 m), weight (!) 141.2 kg, SpO2 97 %.  Intake/Output Summary (Last 24 hours) at 03/10/2022 1341 Last data filed at 03/10/2022 1034 Gross per 24 hour  Intake 630 ml  Output 775 ml  Net -145 ml    Filed Weights   03/07/22 0830 03/09/22 0414 03/10/22 0500  Weight: (!) 141.9 kg (!) 141 kg (!) 141.2 kg     Physical Exam: General:  No acute distress  Head:  Normocephalic, atraumatic. Moist oral mucosal membranes  Eyes:  Anicteric  Lungs:   Clear to auscultation, normal effort  Heart:  S1S2 no rubs  Abdomen:   Soft, nontender, bowel sounds present  Extremities:  2+ peripheral edema.  Neurologic:  Awake, alert, following commands  Skin:  No lesions  Access: None    Basic Metabolic Panel: Recent Labs  Lab 03/06/22 0420 03/07/22 0404 03/08/22 0430 03/09/22 0417 03/10/22 0324  NA 141 142 139 136 139  K 3.9 3.5 3.8 3.5 4.0  CL 110 106 102 101 105  CO2 '24 27 29 25 27  '$ GLUCOSE 217* 119* 141* 150* 154*  BUN 57* 45* 33* 41* 46*  CREATININE 3.61* 3.16* 3.14* 3.69* 3.60*  CALCIUM 8.0* 8.0* 7.8* 7.9* 8.0*  MG  --   --   --   --  1.8  PHOS  --  4.9*  --   --   --      CBC: Recent Labs  Lab 03/04/22 0400 03/07/22 0404 03/08/22 0430 03/09/22 0417  WBC 9.0 10.0 9.7 9.2  HGB 10.3* 10.4* 10.0* 10.1*  HCT 32.8* 33.3* 32.6* 32.3*  MCV 89.9 90.7 91.3 91.5  PLT 316 279 252 247      Urinalysis: No results for input(s): "COLORURINE", "LABSPEC", "PHURINE", "GLUCOSEU", "HGBUR",  "BILIRUBINUR", "KETONESUR", "PROTEINUR", "UROBILINOGEN", "NITRITE", "LEUKOCYTESUR" in the last 72 hours.  Invalid input(s): "APPERANCEUR"    Imaging: No results found.   Medications:      amLODipine  10 mg Oral Daily   aspirin EC  81 mg Oral Daily   Chlorhexidine Gluconate Cloth  6 each Topical Q0600   diphenhydrAMINE  25 mg Intravenous Once in dialysis   heparin  5,000 Units Subcutaneous Q8H   hydrALAZINE  50 mg Oral Q8H   insulin aspart  0-15 Units Subcutaneous TID WC   insulin aspart  8 Units Subcutaneous TID WC   insulin detemir  25 Units Subcutaneous Daily   metoprolol succinate  50 mg Oral Daily   nystatin   Topical BID   polyethylene glycol  34 g Oral BID   sodium chloride flush  3 mL Intravenous Q12H   tamsulosin  0.4 mg Oral Daily    Assessment/ Plan:     Principal Problem:   Acute renal failure superimposed on stage 4 chronic kidney disease (HCC) Active Problems:   Type 2 diabetes mellitus with renal manifestations (HCC)   Sleep apnea   Hypertension   Bilateral leg edema   Left ureteral calculus   Chronic diastolic CHF (congestive heart failure) (HCC)   Hydronephrosis  of left kidney   UTI (urinary tract infection)   Skin irritation   Diarrhea  [name] is a 81 y.o.  male with past medical conditions including Bell's palsy, diabetes, hypertension, hyperlipidemia, sleep apnea on CPAP, and chronic kidney disease stage IV.  Patient presents to the emergency department at the advice of his nephrologist for abnormal renal function.  Patient has been admitted for Bilateral flank pain [R10.9] Suprapubic abdominal pain [R10.2] Abdominal pain [R10.9] Acute renal failure superimposed on chronic kidney disease, unspecified CKD stage, unspecified acute renal failure type ([place]) [N17.9, N18.9] AKI (acute kidney injury) ([place]) [N17.9]   #1: Acute kidney injury on CKD stage IV: Patient has a baseline creatinine of 3.3.  Patient is now at admitted with obstructive  uropathy secondary to nephrolithiasis.  Patient is status post ureteroscopy.  Renal function remained stable today.  Adequate urine output recorded.  We will continue to monitor a.m. labs.  Lower extremity edema has increased.  May consider restarting home regimen of diuretics tomorrow versus hemodialysis.   #2: Hypertension: Continue present antihypertensive treatment.  Blood pressure stable.   #3: Diabetes mellitus type II with chronic kidney disease   glucose remains elevated at times.  Glucose elevated.  Primary team to manage.   #4: Anemia with chronic kidney disease: Hemoglobin stable.   #5: Congestive heart failure: Fluid management temporarily maintained with dialysis.  Patient making adequate urine output without diuresis.  We will continue to monitor.     LOS: [facility] 8/20/20231:41 PM

## 2022-03-10 NOTE — Plan of Care (Signed)

## 2022-03-10 NOTE — Assessment & Plan Note (Signed)
   hold home lasix and Valsartan due to AKI  cont home Toprol  Continue amlodipine  Continue hydralazine 50 mg Q8h due to elevated BP.  As needed hydralazine for systolic blood pressure more than 160

## 2022-03-10 NOTE — Progress Notes (Signed)
Progress Note   Patient: James Clarke RXV:[id number] DOB: February 19, 1941 DOA: 02/25/2022     12 DOS: the patient was seen and examined on 03/10/2022   Brief hospital course: James Clarke is a 81 y.o. male with past medical history of chronic kidney disease 4, hypertension, diabetes mellitus type 2, chronic CHF and bilateral leg edema (Echo 218 2018 may shows normal left ventricular cavity size systolic function normal ejection fraction between 50 to 55%), kidney stones, obstructive sleep apnea who presented 02/25/2022 with abdominal pain evaluated in the urgent care initially and was referred to the emergency room after being told that he has acute kidney injury. Marland Kitchen 08/07: BUN of 80 creatinine of 5.61 (baseline approx 2.5), GFR of 10, WBC 13.3, UA more than 300 protein and small hemoglobin with 0-5 RBCs and 0-5 WBCs. CT Abd/Pelv: stone at the left UVJ with accompanying hydronephrosis.  Renal US: unremarkable. Admitted for ARF on CKD3 suspected prerenal d/t obstructing 2-3 mm calculi within the distal left ureter, and dehydration from decreased po intake plus home lasix and Valsartan use. DVT study given edema but more likely amlodipine effect and hypoalbuminemia.  Marland Kitchen 08/08: seen by urology and nephrology. Echo done: LVEF 55-60 w/ G2 DD.  Marland Kitchen 08/09: Patient underwent left ureteroscopy  . 08/10: Cr 5.91, UA abnormal, started Rocephin for UTI pending cultures, will repeat urine lytes to confirm FeNa --> concern for intrinsic causes.  Nephrology recs: No need for dialysis at the moment. Marland Kitchen 08/11: Minimal improvement in Cr 5.74.  No growth on urine culture, continuing current IV antibiotics. Will recollect urine.  Patient also complaining of some discharge/irritation around urethra/glans, treating for yeast infection . 08/12: Cr 5.5.  . 08/13: Cr 5.14.  Marland Kitchen 08/14: Cr 4.8. Still pending UCx result from 08/11 recollection. Nephrology team is planning for temporary dialysis tomorrow. Pt reports continued loose stool  w/ episodes of stool incontinence, no fever, GI PCR negative. Foley d/c tomorrow for voiding trial per urology. Consult vascular surgery for placement of temp cath tomorrow for dialysis which should hopefully also help fluid overload since lasix unable to be provided d/t renal dysfunction.  Marland Kitchen 08/15: temporary dialysis catheter placed. Plan initiate HD for 3 consecutive days, underwent first session today, no fluid removed. Plan remove Foley in AM for voiding trial.  . 08/16: HD this AM.  Foley out . 8/17: Temp cath removed. Patient and wife requesting to stop antibiotics as they are afraid it is upsetting his stomach . 8/18-8/19: Urology follow-up today.  Await renal recovery over the weekend to determine long-term dialysis need for nephrology.   8/20: Renal function seems stable with good urinary output. Nephrology would like to monitor for 1 more day.  They will decide about continuation of dialysis versus restarting his home diuretics tomorrow.    Assessment and Plan: * Acute renal failure superimposed on stage 4 chronic kidney disease (HCC) Cr 5.61 on presentation.  Baseline around 2.5. AKI due to obstruction, from obstructing 2-3 mm calculi within the distal left ureter, and/or dehydration from home lasix and Valsartan use.  Nephrology and urology following  Status post 3 hemodialysis session through temporary dialysis catheter per nephrology.  Catheter removed on 8/17 and monitor renal function over the weekend to decide long-term dialysis need for nephrology  Lab Results  Component Value Date   CREATININE 3.14 (H) 03/08/2022   CREATININE 3.16 (H) 03/07/2022   CREATININE 3.61 (H) 03/06/2022     Abdominal pain-resolved as of 03/03/2022 CT Abd/Pelv showed Obstructing  2-3 mm calculi within the distal left ureter, however, pt's abdominal pain was across lower abdomen, unlike pt's prior episode of kidney stone pains.   Abdominal pain improved some with BM.  Pain control  Foley in  place  Urology following   Obstructive uropathy Imaging with concern of obstructing 2 to 3 mm calculi within the distal left ureter just proximal to the left ureterovasical junction s/p cystoscopy and stent placement by urology. Patient also completed a course of Rocephin.  Urine cultures negative. -Renal function and urinary output improving. -Continue with oxybutynin and Flomax -Outpatient follow-up with urology  Type 2 diabetes mellitus with renal manifestations (HCC) A1c 6.4, - continue insulin Levemir to 25 units subcu daily -continue mealtime NovoLog to 8 units subcu 3 times daily.   -Continue sliding scale  Hypertension  hold home lasix and Valsartan due to AKI  cont home Toprol  Continue amlodipine  Continue hydralazine 50 mg Q8h due to elevated BP.  As needed hydralazine for systolic blood pressure more than 101  Chronic diastolic CHF (congestive heart failure) (HCC)  Lasix on hold due to kidney failure  Temporary dialysis catheter removed on 03/07/2022.  Plan to monitor kidney function over the weekend to decide long-term dialysis need.  Continue to her lower extremity edema.  -Nephrology might start home diuresis tomorrow versus dialysis.  Bilateral leg edema-resolved as of 03/10/2022  Improved with dialysis.  Sleep apnea -Continue CPAP at night per home setting  UTI (urinary tract infection) Treated with Rocephin.  Stopped antibiotic.  Urine culture showing no growth  Morbid obesity due to excess calories (HCC) Estimated body mass index is 44.67 kg/m as calculated from the following:   Height as of this encounter: '5\' 10"'$  (1.778 m).   Weight as of this encounter: 141.2 kg.   -This will complicate overall prognosis  Diarrhea Seems most likely d/t antibiotics  Discontinued Rocephin.  Diarrhea has improved but he does report some stomach upset.  GI pathogen panel negative   Skin irritation glans penis / urethral meatus Suspect mild yeast infection  versus irritation due to Foley catheter, recent instrumentation  Treated with Diflucan and improved  Keep groin/penis dry as able.  Hydronephrosis of left kidney Obstructing 2-3 mm calculi within the distal left ureter just proximal to the left ureterovesicular junction resulting in mild left hydronephrosis and moderate perinephric stranding.  urology L ureteroscopy 08/09  Continue oxybutynin, Flomax  Left ureteral calculus s/p L ureteroscopy 08/09.  Outpatient urology follow-up.  Continue Flomax and oxybutynin 5 mg p.o. 3 times daily as needed   Subjective: Patient was seen and examined today.  No new complaints.  Hoping to go home tomorrow and no more dialysis.  Physical Exam: Vitals:   03/09/22 2000 03/10/22 0500 03/10/22 0533 03/10/22 0751  BP: (!) 157/78  134/74 139/62  Pulse: 80  71 66  Resp: '20  18 18  '$ Temp: 97.7 F (36.5 C)  97.7 F (36.5 C) (!) 97.5 F (36.4 C)  TempSrc: Oral  Oral Oral  SpO2: 94%  98% 97%  Weight:  (!) 141.2 kg    Height:       General.  Morbidly obese elderly man, in no acute distress. Pulmonary.  Lungs clear bilaterally, normal respiratory effort. CV.  Regular rate and rhythm, no JVD, rub or murmur. Abdomen.  Soft, nontender, nondistended, BS positive. CNS.  Alert and oriented .  No focal neurologic deficit. Extremities.  1+ LE edema, no cyanosis, pulses intact and symmetrical. Psychiatry.  Judgment and  insight appears normal.  Data Reviewed: Prior data reviewed  Family Communication:   Disposition: Status is: Inpatient Remains inpatient appropriate because: Severity of illness   Planned Discharge Destination: Home  DVT prophylaxis.  Subcu heparin Time spent: 45 minutes  This record has been created using Systems analyst. Errors have been sought and corrected,but may not always be located. Such creation errors do not reflect on the standard of care.  Author: Lorella Nimrod, MD 03/10/2022 2:24 PM  For on call  review [email or web address].

## 2022-03-10 NOTE — Assessment & Plan Note (Signed)
Seems most likely d/t antibiotics  Discontinued Rocephin.  Diarrhea has improved but he does report some stomach upset.  GI pathogen panel negative

## 2022-03-10 NOTE — Assessment & Plan Note (Addendum)
Imaging with concern of obstructing 2 to 3 mm calculi within the distal left ureter just proximal to the left ureterovasical junction s/p cystoscopy and stent placement by urology. Patient also completed a course of Rocephin.  Urine cultures negative. -Renal function and urinary output improving. -Continue with oxybutynin and Flomax -Outpatient follow-up with urology

## 2022-03-10 NOTE — Assessment & Plan Note (Signed)
Treated with Rocephin.  Stopped antibiotic.  Urine culture showing no growth

## 2022-03-10 NOTE — Assessment & Plan Note (Signed)
-  Continue CPAP at night per home setting

## 2022-03-10 NOTE — Assessment & Plan Note (Signed)
Estimated body mass index is 44.67 kg/m as calculated from the following:   Height as of this encounter: '5\' 10"'$  (1.778 m).   Weight as of this encounter: 141.2 kg.   -This will complicate overall prognosis

## 2022-03-10 NOTE — Assessment & Plan Note (Signed)
glans penis / urethral meatus Suspect mild yeast infection versus irritation due to Foley catheter, recent instrumentation  Treated with Diflucan and improved  Keep groin/penis dry as able.

## 2022-03-10 NOTE — Assessment & Plan Note (Signed)
   Lasix on hold due to kidney failure  Temporary dialysis catheter removed on 03/07/2022.  Plan to monitor kidney function over the weekend to decide long-term dialysis need.  Continue to her lower extremity edema.  -Nephrology might start home diuresis tomorrow versus dialysis.

## 2022-03-11 DIAGNOSIS — N1330 Unspecified hydronephrosis: Secondary | ICD-10-CM | POA: Diagnosis not present

## 2022-03-11 DIAGNOSIS — N179 Acute kidney failure, unspecified: Secondary | ICD-10-CM | POA: Diagnosis not present

## 2022-03-11 DIAGNOSIS — R600 Localized edema: Secondary | ICD-10-CM | POA: Diagnosis not present

## 2022-03-11 DIAGNOSIS — R109 Unspecified abdominal pain: Secondary | ICD-10-CM | POA: Diagnosis not present

## 2022-03-11 LAB — RENAL FUNCTION PANEL
Albumin: 2.8 g/dL — ABNORMAL LOW (ref 3.5–5.0)
Anion gap: 10 (ref 5–15)
BUN: 53 mg/dL — ABNORMAL HIGH (ref 8–23)
CO2: 23 mmol/L (ref 22–32)
Calcium: 8.2 mg/dL — ABNORMAL LOW (ref 8.9–10.3)
Chloride: 105 mmol/L (ref 98–111)
Creatinine, Ser: 3.57 mg/dL — ABNORMAL HIGH (ref 0.61–1.24)
GFR, Estimated: 16 mL/min — ABNORMAL LOW (ref >60)
Glucose, Bld: 157 mg/dL — ABNORMAL HIGH (ref 70–99)
Phosphorus: 5.2 mg/dL — ABNORMAL HIGH (ref 2.5–4.6)
Potassium: 3.7 mmol/L (ref 3.5–5.1)
Sodium: 138 mmol/L (ref 135–145)

## 2022-03-11 LAB — GLUCOSE, CAPILLARY
Glucose-Capillary: 223 mg/dL — ABNORMAL HIGH (ref 70–99)
Glucose-Capillary: 370 mg/dL — ABNORMAL HIGH (ref 70–99)

## 2022-03-11 MED ORDER — HYDRALAZINE HCL 50 MG PO TABS: 50.0000 mg | ORAL_TABLET | Freq: Three times a day (TID) | ORAL | 1 refills | Status: DC

## 2022-03-11 MED ORDER — FUROSEMIDE 40 MG PO TABS
40.0000 mg | ORAL_TABLET | ORAL | Status: DC
  Administered 2022-03-11: 40 mg via ORAL
  Filled 2022-03-11: qty 1

## 2022-03-11 MED ORDER — INSULIN DETEMIR 100 UNIT/ML ~~LOC~~ SOLN: 30.0000 [IU] | Freq: Every day | SUBCUTANEOUS | 11 refills | Status: DC

## 2022-03-11 MED ORDER — OXYBUTYNIN CHLORIDE 5 MG PO TABS: 5.0000 mg | ORAL_TABLET | Freq: Three times a day (TID) | ORAL | 0 refills | Status: DC | PRN

## 2022-03-11 MED ORDER — FUROSEMIDE 40 MG PO TABS: 40.0000 mg | ORAL_TABLET | ORAL | 1 refills | Status: DC

## 2022-03-11 MED ORDER — TAMSULOSIN HCL 0.4 MG PO CAPS: 0.4000 mg | ORAL_CAPSULE | Freq: Every day | ORAL | 1 refills | Status: DC

## 2022-03-11 MED ORDER — AMLODIPINE BESYLATE 10 MG PO TABS: 10.0000 mg | ORAL_TABLET | Freq: Every day | ORAL | 1 refills | Status: DC

## 2022-03-11 NOTE — TOC Transition Note (Signed)
Transition of Care [facility]) - CM/SW Discharge Note   Patient Details  Name: James Clarke MRN: [id number] Date of Birth: 1940/10/02  [facility] CM/SW Contact:  Beverly Sessions, RN Phone Number: 03/11/2022, 11:12 AM   Clinical Narrative:      Patient to dc today Wife to transport MD or place home health order for PT and OT Meg with [facility] notified of discharge        Patient Goals and CMS Choice        Discharge Placement                       Discharge Plan and Services                                     Social Determinants of Health (SDOH) Interventions     Readmission Risk Interventions    02/27/2022    4:22 PM  Readmission Risk Prevention Plan  Transportation Screening Complete  HRI or Home Care Consult Complete  Palliative Care Screening Not Applicable  Medication Review (RN Care Manager) Complete

## 2022-03-11 NOTE — Discharge Summary (Signed)
Physician Discharge Summary   Patient: James Clarke MRN: [id number] DOB: 03/22/1941  Admit date:     02/25/2022  Discharge date: 03/11/22  Discharge Physician: Lorella Nimrod   PCP: Sofie Hartigan, MD   Recommendations at discharge:  Please obtain renal function in next few days Follow-up with nephrology within a week Follow-up with primary care provider Follow-up with urology  Discharge Diagnoses: Principal Problem:   Acute renal failure superimposed on chronic kidney disease ([place]) Active Problems:   Obstructive uropathy   Type 2 diabetes mellitus with renal manifestations ([place])   Hypertension   Chronic diastolic CHF (congestive heart failure) ([place])   Sleep apnea   UTI (urinary tract infection)   Morbid obesity due to excess calories ([place])   Left ureteral calculus   Hydronephrosis of left kidney   Skin irritation   Diarrhea  Resolved Problems:   Abdominal pain   Bilateral leg edema  Hospital Course: James Clarke is a 81 y.o. male with past medical history of chronic kidney disease 4, hypertension, diabetes mellitus type 2, chronic CHF and bilateral leg edema (Echo 218 2018 may shows normal left ventricular cavity size systolic function normal ejection fraction between 50 to 55%), kidney stones, obstructive sleep apnea who presented 02/25/2022 with abdominal pain evaluated in the urgent care initially and was referred to the emergency room after being told that he has acute kidney injury. 08/07: BUN of 80 creatinine of 5.61 (baseline approx 2.5), GFR of 10, WBC 13.3, UA more than 300 protein and small hemoglobin with 0-5 RBCs and 0-5 WBCs. CT Abd/Pelv: stone at the left UVJ with accompanying hydronephrosis.  Renal US: unremarkable. Admitted for ARF on CKD3 suspected prerenal d/t obstructing 2-3 mm calculi within the distal left ureter, and dehydration from decreased po intake plus home lasix and Valsartan use. DVT study given edema but more likely amlodipine effect and  hypoalbuminemia.  08/08: seen by urology and nephrology. Echo done: LVEF 55-60 w/ G2 DD.  08/09: Patient underwent left ureteroscopy  08/10: Cr 5.91, UA abnormal, started Rocephin for UTI pending cultures, will repeat urine lytes to confirm FeNa --> concern for intrinsic causes.  Nephrology recs: No need for dialysis at the moment. 08/11: Minimal improvement in Cr 5.74.  No growth on urine culture, continuing current IV antibiotics. Will recollect urine.  Patient also complaining of some discharge/irritation around urethra/glans, treating for yeast infection 08/12: Cr 5.5.  08/13: Cr 5.14.  08/14: Cr 4.8. Still pending UCx result from 08/11 recollection. Nephrology team is planning for temporary dialysis tomorrow. Pt reports continued loose stool w/ episodes of stool incontinence, no fever, GI PCR negative. Foley d/c tomorrow for voiding trial per urology. Consult vascular surgery for placement of temp cath tomorrow for dialysis which should hopefully also help fluid overload since lasix unable to be provided d/t renal dysfunction.  08/15: temporary dialysis catheter placed. Plan initiate HD for 3 consecutive days, underwent first session today, no fluid removed. Plan remove Foley in AM for voiding trial.  08/16: HD this AM.  Foley out 8/17: Temp cath removed. Patient and wife requesting to stop antibiotics as they are afraid it is upsetting his stomach 8/18-8/19: Urology follow-up today.  Await renal recovery over the weekend to determine long-term dialysis need for nephrology.   8/20: Renal function seems stable with good urinary output. Nephrology would like to monitor for 1 more day.  They will decide about continuation of dialysis versus restarting his home diuretics tomorrow. 8/21: Renal functions remained stable,  nephrology decided to discharge him with a close outpatient follow-up. Per nephrology patient will resume his home [place] and will follow-up with his providers very closely for further  recommendations.  His home ARB was held and he is being discharged on hydralazine and increased dose of amlodipine for blood pressure.  Patient will continue on current medications and need to have a close follow-up with his providers.  Assessment and Plan: * Acute renal failure superimposed on chronic kidney disease (HCC) Cr 5.61 on presentation.  Baseline around 2.5. AKI due to obstruction, from obstructing 2-3 mm calculi within the distal left ureter, and/or dehydration from home lasix and Valsartan use. Nephrology and urology following Status post 3 hemodialysis session through temporary dialysis catheter per nephrology.  Catheter removed on 8/17 and monitor renal function over the weekend to decide long-term dialysis need for nephrology  Lab Results  Component Value Date   CREATININE 3.14 (H) 03/08/2022   CREATININE 3.16 (H) 03/07/2022   CREATININE 3.61 (H) 03/06/2022     Abdominal pain-resolved as of 03/03/2022 CT Abd/Pelv showed Obstructing 2-3 mm calculi within the distal left ureter, however, pt's abdominal pain was across lower abdomen, unlike pt's prior episode of kidney stone pains.   Abdominal pain improved some with BM. Pain control Foley in place Urology following   Obstructive uropathy Imaging with concern of obstructing 2 to 3 mm calculi within the distal left ureter just proximal to the left ureterovasical junction s/p cystoscopy and stent placement by urology. Patient also completed a course of Rocephin.  Urine cultures negative. -Renal function and urinary output improving. -Continue with oxybutynin and Flomax -Outpatient follow-up with urology  Type 2 diabetes mellitus with renal manifestations (HCC) A1c 6.4, - continue insulin Levemir to 25 units subcu daily -continue mealtime NovoLog to 8 units subcu 3 times daily.   -Continue sliding scale  Hypertension hold home lasix and Valsartan due to AKI cont home Toprol Continue amlodipine Continue hydralazine 50  mg Q8h due to elevated BP.  As needed hydralazine for systolic blood pressure more than 161  Chronic diastolic CHF (congestive heart failure) (HCC) Lasix on hold due to kidney failure Temporary dialysis catheter removed on 03/07/2022.  Plan to monitor kidney function over the weekend to decide long-term dialysis need.  Continue to her lower extremity edema. -Nephrology might start home diuresis tomorrow versus dialysis.  Bilateral leg edema-resolved as of 03/10/2022 Improved with dialysis.  Sleep apnea -Continue CPAP at night per home setting  UTI (urinary tract infection) Treated with Rocephin.  Stopped antibiotic.  Urine culture showing no growth  Morbid obesity due to excess calories (HCC) Estimated body mass index is 44.67 kg/m as calculated from the following:   Height as of this encounter: '5\' 10"'$  (1.778 m).   Weight as of this encounter: 141.2 kg.   -This will complicate overall prognosis  Diarrhea Seems most likely d/t antibiotics Discontinued Rocephin.  Diarrhea has improved but he does report some stomach upset.  GI pathogen panel negative   Skin irritation glans penis / urethral meatus Suspect mild yeast infection versus irritation due to Foley catheter, recent instrumentation Treated with Diflucan and improved Keep groin/penis dry as able.  Hydronephrosis of left kidney Obstructing 2-3 mm calculi within the distal left ureter just proximal to the left ureterovesicular junction resulting in mild left hydronephrosis and moderate perinephric stranding. urology L ureteroscopy 08/09 Continue oxybutynin, Flomax  Left ureteral calculus s/p L ureteroscopy 08/09.  Outpatient urology follow-up.  Continue Flomax and oxybutynin 5 mg p.o.  3 times daily as needed   Consultants: Nephrology, urology Procedures performed: Hemodialysis, ureteral stent placement, cystoscopy Disposition: Home health Diet recommendation:  Discharge Diet Orders (From admission, onward)     Start      Ordered   03/11/22 0000  Diet - low sodium heart healthy        03/11/22 1109           Cardiac and Carb modified diet DISCHARGE MEDICATION: Allergies as of 03/11/2022       Reactions   Codeine Nausea And Vomiting   Doxycycline    Erythromycin Rash        Medication List     STOP taking these medications    ketoconazole 2 % cream Commonly known as: NIZORAL   neomycin-bacitracin-polymyxin [id number] ointment Commonly known as: NEOSPORIN   potassium chloride SA 20 MEQ tablet Commonly known as: KLOR-CON M   valsartan 320 MG tablet Commonly known as: DIOVAN   vitamin A & D ointment       TAKE these medications    albuterol 108 (90 Base) MCG/ACT inhaler Commonly known as: VENTOLIN HFA Inhale 2 puffs into the lungs every 6 (six) hours as needed for wheezing or shortness of breath.   amLODipine 10 MG tablet Commonly known as: NORVASC Take 1 tablet (10 mg total) by mouth daily. Start taking on: March 12, 2022 What changed:  medication strength how much to take   aspirin EC 81 MG tablet Take by mouth.   calcitRIOL 0.25 MCG capsule Commonly known as: ROCALTROL Take 0.25 mcg by mouth daily.   clotrimazole 1 % cream Commonly known as: LOTRIMIN Apply 1 Application topically daily as needed.   furosemide 40 MG tablet Commonly known as: LASIX Take 1 tablet (40 mg total) by mouth every other day. Start taking on: March 13, 2022 What changed: when to take this   Global Inject Ease Insulin Syr 31G X 5/16" 0.5 ML Misc Generic drug: Insulin Syringe-Needle U-100 Inject into the skin. What changed: Another medication with the same name was removed. Continue taking this medication, and follow the directions you see here.   hydrALAZINE 50 MG tablet Commonly known as: APRESOLINE Take 1 tablet (50 mg total) by mouth every 8 (eight) hours.   insulin aspart 100 UNIT/ML injection Commonly known as: novoLOG Inject 30 units before breakfast and lunch. Inject  55 units before supper.   insulin detemir 100 UNIT/ML injection Commonly known as: LEVEMIR Inject 0.3 mLs (30 Units total) into the skin daily. What changed: how much to take   lovastatin 40 MG tablet Commonly known as: MEVACOR Take 40 mg by mouth daily with supper.   metoprolol succinate 50 MG 24 hr tablet Commonly known as: TOPROL-XL Take 50 mg by mouth daily.   OneTouch Delica Plus [id number] Misc USE 1 LANCET 4 TIMES DAILY   OneTouch Ultra test strip Generic drug: glucose blood TEST BLOOD SUGAR 4 TIMES DAILY   oxybutynin 5 MG tablet Commonly known as: DITROPAN Take 1 tablet (5 mg total) by mouth every 8 (eight) hours as needed for bladder spasms.   tamsulosin 0.4 MG Caps capsule Commonly known as: FLOMAX Take 1 capsule (0.4 mg total) by mouth daily. Start taking on: March 12, 2022               Discharge Care Instructions  (From admission, onward)           Start     Ordered   03/11/22 0000  No dressing needed  03/11/22 1109            Follow-up Information     Feldpausch, Chrissie Noa, MD. Schedule an appointment as soon as possible for a visit in 1 week(s).   Specialty: Family Medicine Contact information: [place] [phone] [phone]         Anthonette Legato, MD. Schedule an appointment as soon as possible for a visit in 1 week(s).   Specialty: Nephrology Contact information: [place] [address] [phone]                Discharge Exam: [place] [employer]   03/09/22 0414 03/10/22 0500 03/11/22 0548  Weight: (!) 141 kg (!) 141.2 kg (!) 141.1 kg   General.  Morbidly obese elderly man, in no acute distress. Pulmonary.  Lungs clear bilaterally, normal respiratory effort. CV.  Regular rate and rhythm, no JVD, rub or murmur. Abdomen.  Soft, nontender, nondistended, BS positive. CNS.  Alert and oriented .  No focal neurologic deficit. Extremities.   1+ LE edema, no cyanosis, pulses intact and symmetrical. Psychiatry.  Judgment and insight appears normal.   Condition at discharge: stable  The results of significant diagnostics from this hospitalization (including imaging, microbiology, ancillary and laboratory) are listed below for reference.   Imaging Studies: PERIPHERAL VASCULAR CATHETERIZATION  Result Date: 03/05/2022 See surgical note for result.  DG OR UROLOGY CYSTO IMAGE ([facility] ONLY)  Result Date: 02/27/2022 There is no interpretation for this exam.  This order is for images obtained during a surgical procedure.  Please See "Surgeries" Tab for more information regarding the procedure.   ECHOCARDIOGRAM COMPLETE  Result Date: 02/26/2022    ECHOCARDIOGRAM REPORT   Patient Name:   James Clarke Date of Exam: 02/26/2022 Medical Rec #:  [id number]       Height:       70.0 in Accession #:    [id number]      Weight:       328.5 lb Date of Birth:  1941-05-23      BSA:          2.577 m Patient Age:    81 years        BP:           156/72 mmHg Patient Gender: M               HR:           77 bpm. Exam Location:  [facility] Procedure: 2D Echo, Color Doppler, Cardiac Doppler and Intracardiac            Opacification Agent Indications:     CHF-acute systolic F74.94  History:         Patient has prior history of Echocardiogram examinations, most                  recent 11/26/2016. Signs/Symptoms:Dyspnea; Risk Factors:Diabetes,                  Hypertension and Sleep Apnea.  Sonographer:     Sherrie Sport Referring Phys:  [place] Diagnosing Phys: Yolonda Kida MD  Sonographer Comments: Technically challenging study due to limited acoustic windows, suboptimal apical window and no subcostal window. IMPRESSIONS  1. Left ventricular ejection fraction, by estimation, is 55 to 60%. The left ventricle has normal function. The left ventricle has no regional wall motion abnormalities. The left ventricular internal cavity size was  mildly dilated. Left  ventricular diastolic parameters are consistent with Grade II diastolic dysfunction (pseudonormalization).  2. Right ventricular systolic function is normal. The right ventricular size is normal.  3. Left atrial size was mildly dilated.  4. Right atrial size was mildly dilated.  5. The mitral valve is normal in structure. Trivial mitral valve regurgitation.  6. The aortic valve is grossly normal. Aortic valve regurgitation is not visualized. FINDINGS  Left Ventricle: Left ventricular ejection fraction, by estimation, is 55 to 60%. The left ventricle has normal function. The left ventricle has no regional wall motion abnormalities. Definity contrast agent was given IV to delineate the left ventricular  endocardial borders. The left ventricular internal cavity size was mildly dilated. There is no left ventricular hypertrophy. Left ventricular diastolic parameters are consistent with Grade II diastolic dysfunction (pseudonormalization). Right Ventricle: The right ventricular size is normal. No increase in right ventricular wall thickness. Right ventricular systolic function is normal. Left Atrium: Left atrial size was mildly dilated. Right Atrium: Right atrial size was mildly dilated. Pericardium: There is no evidence of pericardial effusion. Mitral Valve: The mitral valve is normal in structure. Trivial mitral valve regurgitation. Tricuspid Valve: The tricuspid valve is normal in structure. Tricuspid valve regurgitation is trivial. Aortic Valve: The aortic valve is grossly normal. Aortic valve regurgitation is not visualized. Aortic valve mean gradient measures 6.0 mmHg. Aortic valve peak gradient measures 9.1 mmHg. Aortic valve area, by VTI measures 2.56 cm. Pulmonic Valve: The pulmonic valve was normal in structure. Pulmonic valve regurgitation is not visualized. Aorta: The ascending aorta was not well visualized. IAS/Shunts: No atrial level shunt detected by color flow Doppler.  LEFT VENTRICLE PLAX 2D LVIDd:          5.10 cm   Diastology LVIDs:         3.60 cm   LV e' medial:    2.94 cm/s LV PW:         1.50 cm   LV E/e' medial:  27.8 LV IVS:        1.50 cm   LV e' lateral:   6.64 cm/s LVOT diam:     2.00 cm   LV E/e' lateral: 12.3 LV SV:         65 LV SV Index:   25 LVOT Area:     3.14 cm  RIGHT VENTRICLE RV Basal diam:  4.50 cm RV S prime:     12.30 cm/s TAPSE (M-mode): 3.4 cm LEFT ATRIUM              Index        RIGHT ATRIUM           Index LA diam:        4.10 cm  1.59 cm/m   RA Area:     33.70 cm LA Vol (A2C):   114.0 ml 44.23 ml/m  RA Volume:   123.00 ml 47.72 ml/m LA Vol (A4C):   123.0 ml 47.72 ml/m LA Biplane Vol: 119.0 ml 46.17 ml/m  AORTIC VALVE AV Area (Vmax):    2.10 cm AV Area (Vmean):   1.98 cm AV Area (VTI):     2.56 cm AV Vmax:           151.00 cm/s AV Vmean:          110.000 cm/s AV VTI:            0.253 m AV Peak Grad:      9.1 mmHg AV Mean Grad:  6.0 mmHg LVOT Vmax:         101.00 cm/s LVOT Vmean:        69.200 cm/s LVOT VTI:          0.206 m LVOT/AV VTI ratio: 0.81  AORTA Ao Root diam: 4.07 cm MITRAL VALVE               TRICUSPID VALVE MV Area (PHT): 4.57 cm    TR Peak grad:   7.6 mmHg MV Decel Time: 166 msec    TR Vmax:        138.00 cm/s MV E velocity: 81.80 cm/s MV A velocity: 53.60 cm/s  SHUNTS MV E/A ratio:  1.53        Systemic VTI:  0.21 m                            Systemic Diam: 2.00 cm Dwayne Prince [place] MD Electronically signed by Yolonda Kida MD Signature Date/Time: 02/26/2022/1:51:02 PM    Final    CT ABDOMEN PELVIS WO CONTRAST  Result Date: 02/26/2022 CLINICAL DATA:  Abdominal pain, acute, nonlocalized abd pain/ flank pain/acute kidney injury. EXAM: CT ABDOMEN AND PELVIS WITHOUT CONTRAST TECHNIQUE: Multidetector CT imaging of the abdomen and pelvis was performed following the standard protocol without IV contrast. RADIATION DOSE REDUCTION: This exam was performed according to the departmental dose-optimization program which includes automated exposure control, adjustment  of the mA and/or kV according to patient size and/or use of iterative reconstruction technique. COMPARISON:  07/26/2019 FINDINGS: Lower chest: Mildly progressive subpleural bibasilar pulmonary fibrotic change. Extensive coronary artery calcification. Global cardiac size within normal limits. Stable trace pericardial effusion. Hepatobiliary: No focal liver abnormality is seen. Status post cholecystectomy. No biliary dilatation. Pancreas: Unremarkable Spleen: Unremarkable Adrenals/Urinary Tract: The adrenal glands are unremarkable. The kidneys are normal in size and position. There is mild left hydronephrosis and moderate perinephric stranding secondary to 2 obstructing calculi measuring 2-3 mm in size within the distal left ureter just proximal to the left ureterovesicular junction. There is superimposed bilateral nonobstructing nephrolithiasis with calculi measuring up to 10 mm in greatest dimension bilaterally. No ureteral calculi on the right. No hydronephrosis on the right. Simple exophytic cortical cyst arises from the upper pole of the right kidney. No follow-up imaging is recommended for this lesion. The bladder is unremarkable. Stomach/Bowel: Moderate sigmoid diverticulosis without superimposed acute inflammatory change. Stomach, small bowel, and large bowel are otherwise unremarkable. Appendix absent. No free intraperitoneal gas or fluid. Vascular/Lymphatic: Mild aortoiliac atherosclerotic calcification. No aortic aneurysm. No pathologic adenopathy within the abdomen and pelvis. Reproductive: Mild prostatic hypertrophy. Other: Small fat containing umbilical and bilateral inguinal hernias. Rectum unremarkable. Musculoskeletal: No acute bone abnormality. No lytic or blastic bone lesion. IMPRESSION: 1. Obstructing 2-3 mm calculi within the distal left ureter just proximal to the left ureterovesicular junction resulting in mild left hydronephrosis and moderate perinephric stranding. 2. Superimposed moderate  bilateral nonobstructing nephrolithiasis. 3. Moderate sigmoid diverticulosis without superimposed acute inflammatory change. 4. Extensive coronary artery calcification. 5. Mildly progressive bibasilar pulmonary fibrosis. Aortic Atherosclerosis (ICD10-I70.0). Electronically Signed   By: Fidela Salisbury M.D.   On: 02/26/2022 02:34   US Renal  Result Date: 02/25/2022 CLINICAL DATA:  Worsening renal function. EXAM: RENAL / URINARY TRACT ULTRASOUND COMPLETE COMPARISON:  None Available. FINDINGS: Right Kidney: Renal measurements: 12.1 x 5.7 x 4.9 cm = volume: 175 mL. Mild renal cortical thinning and increased echogenicity. No hydronephrosis. Multiple cysts within the  right kidney measuring up to 4.2 cm. Left Kidney: Renal measurements: 12.3 x 6.4 x 5.5 cm = volume: 225 mL. Renal cortical thinning. No hydronephrosis. No renal mass. Bladder: Appears normal for degree of bladder distention. Other: None. IMPRESSION: No hydronephrosis. Mild renal cortical thinning bilaterally, raising the possibility of chronic medical renal disease. Electronically Signed   By: Lovey Newcomer M.D.   On: 02/25/2022 15:21   DG Abdomen 1 View  Result Date: 02/25/2022 CLINICAL DATA:  Abdominal pain and dysuria. Urinary tract infection symptoms. EXAM: ABDOMEN - 1 VIEW COMPARISON:  Abdominal radiograph 02/19/2021 FINDINGS: Redemonstrated opacities within the lung base, nonspecific. Nonobstructed bowel-gas pattern. Cholecystectomy clips. Right hemipelvis phlebolith. Lumbar spine degenerative changes. Stool throughout the colon. IMPRESSION: Nonobstructed bowel-gas pattern. No definite radiodensities to suggest nephrolithiasis. Electronically Signed   By: Lovey Newcomer M.D.   On: 02/25/2022 10:24    Microbiology: Results for orders placed or performed during the hospital encounter of 02/25/22  Urine Culture     Status: None   Collection Time: 02/28/22 12:50 PM   Specimen: Urine, Catheterized  Result Value Ref Range Status   Specimen Description    Final    URINE, CATHETERIZED Performed at [facility], [address], [place], [place] [address]    Special Requests   Final    NONE Performed at [facility], [address] [place], [place] [address]    Culture   Final    NO GROWTH Performed at [facility], [place] [address] [place],  [address]    Report Status 03/01/2022 FINAL  Final  Gastrointestinal Panel by PCR , Stool     Status: None   Collection Time: 03/04/22  1:18 PM   Specimen: Stool  Result Value Ref Range Status   Campylobacter species NOT DETECTED NOT DETECTED Final   Plesimonas shigelloides NOT DETECTED NOT DETECTED Final   Salmonella species NOT DETECTED NOT DETECTED Final   Yersinia enterocolitica NOT DETECTED NOT DETECTED Final   Vibrio species NOT DETECTED NOT DETECTED Final   Vibrio cholerae NOT DETECTED NOT DETECTED Final   Enteroaggregative E coli (EAEC) NOT DETECTED NOT DETECTED Final   Enteropathogenic E coli (EPEC) NOT DETECTED NOT DETECTED Final   Enterotoxigenic E coli (ETEC) NOT DETECTED NOT DETECTED Final   Shiga like toxin producing E coli (STEC) NOT DETECTED NOT DETECTED Final   Shigella/Enteroinvasive E coli (EIEC) NOT DETECTED NOT DETECTED Final   Cryptosporidium NOT DETECTED NOT DETECTED Final   Cyclospora cayetanensis NOT DETECTED NOT DETECTED Final   Entamoeba histolytica NOT DETECTED NOT DETECTED Final   Giardia lamblia NOT DETECTED NOT DETECTED Final   Adenovirus F40/41 NOT DETECTED NOT DETECTED Final   Astrovirus NOT DETECTED NOT DETECTED Final   Norovirus GI/GII NOT DETECTED NOT DETECTED Final   Rotavirus A NOT DETECTED NOT DETECTED Final   Sapovirus (I, II, IV, and V) NOT DETECTED NOT DETECTED Final    Comment: Performed at [facility], [address] [place],  [address]    Labs: CBC: Recent Labs  Lab 03/07/22 0404 03/08/22 0430 03/09/22 0417  WBC 10.0 9.7 9.2  HGB 10.4* 10.0* 10.1*  HCT 33.3* 32.6* 32.3*  MCV 90.7 91.3  91.5  PLT 279 252 144   Basic Metabolic Panel: Recent Labs  Lab 03/07/22 0404 03/08/22 0430 03/09/22 0417 03/10/22 0324 03/11/22 0335  NA 142 139 136 139 138  K 3.5 3.8 3.5 4.0 3.7  CL 106 102 101 105 105  CO2 27  $'29 25 27 23  'S$ GLUCOSE 119* 141* 150* 154* 157*  BUN 45* 33* 41* 46* 53*  CREATININE 3.16* 3.14* 3.69* 3.60* 3.57*  CALCIUM 8.0* 7.8* 7.9* 8.0* 8.2*  MG  --   --   --  1.8  --   PHOS 4.9*  --   --   --  5.2*   Liver Function Tests: Recent Labs  Lab 03/07/22 0404 03/11/22 0335  ALBUMIN 2.7* 2.8*   CBG: Recent Labs  Lab 03/10/22 0748 03/10/22 1131 03/10/22 1701 03/10/22 2039 03/11/22 0826  GLUCAP 190* 323* 203* 174* 223*    Discharge time spent: greater than 30 minutes.  This record has been created using Systems analyst. Errors have been sought and corrected,but may not always be located. Such creation errors do not reflect on the standard of care.   Signed: Lorella Nimrod, MD [facility] 03/11/2022

## 2022-03-11 NOTE — Progress Notes (Signed)
Pt discharged per MD order. IV removed. Discharge instructions reviewed with pt. Pt verbalized understanding with all questions answered to pt satisfaction. Pt taken out in wheelchair by volunteer.

## 2022-03-11 NOTE — Care Management Important Message (Signed)
Important Message  Patient Details  Name: James Clarke MRN: [id number] Date of Birth: 09-30-1940   Medicare Important Message Given:  Yes     [name] 03/11/2022, 9:12 AM

## 2022-03-11 NOTE — Inpatient Diabetes Management (Signed)
Inpatient Diabetes Program Recommendations  AACE/ADA: New Consensus Statement on Inpatient Glycemic Control (2015)  Target Ranges:  Prepandial:   less than 140 mg/dL      Peak postprandial:   less than 180 mg/dL (1-2 hours)      Critically ill patients:  140 - 180 mg/dL   Lab Results  Component Value Date   GLUCAP 223 (H) 03/11/2022   HGBA1C 6.4 (H) 02/25/2022    Review of Glycemic Control  Latest Reference Range & Units 03/10/22 17:01 03/10/22 20:39 03/11/22 08:26  Glucose-Capillary 70 - 99 mg/dL 203 (H) 174 (H) 223 (H)  (H): Data is abnormally high Diabetes history: Type 2 Dm Outpatient Diabetes medications: Novolog 30/30/55 B/L/D, Levemir 50 units QD Current orders for Inpatient glycemic control: Novolog 0-15 units TID, Novolog 8 units TID, Levemir 25 units QD  Inpatient Diabetes Program Recommendations:    If to remain inpatient, consider increasing Novolog to 12 units TID (assuming patient consuming >50% of meals).   Thanks, Bronson Curb, MSN, RNC-OB Diabetes Coordinator [phone] (8a-5p)

## 2022-03-11 NOTE — Progress Notes (Signed)
[facility]  PROGRESS NOTE   Subjective:   Patient seen resting in bed, wife at bedside Alert and oriented States he is able to ambulate in room and walk around nurses station. Tolerating meals without nausea and vomiting Reports improved shortness of breath with exertion  Creatinine 3.57  Objective:  Vital signs: Blood pressure (!) 148/66, pulse 73, temperature (!) 97.5 F (36.4 C), temperature source Oral, resp. rate 18, height '5\' 10"'$  (1.778 m), weight (!) 141.1 kg, SpO2 100 %.  Intake/Output Summary (Last 24 hours) at 03/11/2022 1602 Last data filed at 03/11/2022 1023 Gross per 24 hour  Intake 180 ml  Output 300 ml  Net -120 ml    Filed Weights   03/09/22 0414 03/10/22 0500 03/11/22 0548  Weight: (!) 141 kg (!) 141.2 kg (!) 141.1 kg     Physical Exam: General:  No acute distress  Head:  Normocephalic, atraumatic. Moist oral mucosal membranes  Eyes:  Anicteric  Lungs:   Clear to auscultation, normal effort  Heart:  S1S2 no rubs  Abdomen:   Soft, nontender, bowel sounds present  Extremities:  2+ peripheral edema.  Neurologic:  Awake, alert, following commands  Skin:  No lesions  Access: None    Basic Metabolic Panel: Recent Labs  Lab 03/07/22 0404 03/08/22 0430 03/09/22 0417 03/10/22 0324 03/11/22 0335  NA 142 139 136 139 138  K 3.5 3.8 3.5 4.0 3.7  CL 106 102 101 105 105  CO2 '27 29 25 27 23  '$ GLUCOSE 119* 141* 150* 154* 157*  BUN 45* 33* 41* 46* 53*  CREATININE 3.16* 3.14* 3.69* 3.60* 3.57*  CALCIUM 8.0* 7.8* 7.9* 8.0* 8.2*  MG  --   --   --  1.8  --   PHOS 4.9*  --   --   --  5.2*     CBC: Recent Labs  Lab 03/07/22 0404 03/08/22 0430 03/09/22 0417  WBC 10.0 9.7 9.2  HGB 10.4* 10.0* 10.1*  HCT 33.3* 32.6* 32.3*  MCV 90.7 91.3 91.5  PLT 279 252 247      Urinalysis: No results for input(s): "COLORURINE", "LABSPEC", "PHURINE", "GLUCOSEU", "HGBUR", "BILIRUBINUR", "KETONESUR", "PROTEINUR", "UROBILINOGEN", "NITRITE",  "LEUKOCYTESUR" in the last 72 hours.  Invalid input(s): "APPERANCEUR"    Imaging: No results found.   Medications:      amLODipine  10 mg Oral Daily   aspirin EC  81 mg Oral Daily   Chlorhexidine Gluconate Cloth  6 each Topical Q0600   diphenhydrAMINE  25 mg Intravenous Once in dialysis   furosemide  40 mg Oral QODAY   heparin  5,000 Units Subcutaneous Q8H   hydrALAZINE  50 mg Oral Q8H   insulin aspart  0-15 Units Subcutaneous TID WC   insulin aspart  8 Units Subcutaneous TID WC   insulin detemir  25 Units Subcutaneous Daily   metoprolol succinate  50 mg Oral Daily   nystatin   Topical BID   polyethylene glycol  34 g Oral BID   sodium chloride flush  3 mL Intravenous Q12H   tamsulosin  0.4 mg Oral Daily    Assessment/ Plan:     Principal Problem:   Acute renal failure superimposed on chronic kidney disease (HCC) Active Problems:   Type 2 diabetes mellitus with renal manifestations (HCC)   Sleep apnea   Morbid obesity due to excess calories ([place])   Hypertension   Left ureteral calculus   Chronic diastolic CHF (congestive heart failure) (HCC)   Hydronephrosis of left  kidney   UTI (urinary tract infection)   Skin irritation   Diarrhea   Obstructive uropathy  Mr. Carver Murakami is a 81 y.o.  male with past medical conditions including Bell's palsy, diabetes, hypertension, hyperlipidemia, sleep apnea on CPAP, and chronic kidney disease stage IV.  Patient presents to the emergency department at the advice of his nephrologist for abnormal renal function.  Patient has been admitted for Bilateral flank pain [R10.9] Suprapubic abdominal pain [R10.2] Abdominal pain [R10.9] Acute renal failure superimposed on chronic kidney disease, unspecified CKD stage, unspecified acute renal failure type ([place]) [N17.9, N18.9] AKI (acute kidney injury) ([place]) [N17.9]   #1: Acute kidney injury on CKD stage IV: Patient has a baseline creatinine of 3.3.  Patient is now at admitted with  obstructive uropathy secondary to nephrolithiasis.  Patient is status post ureteroscopy.  Renal function appears to have stabilized.  Patient continues to have adequate urine output.  We will start home regimen of furosemide 40 mg every other day.  Patient cleared to discharge from renal stance and follow-up in our office in 1 week. no further need for hemodialysis at this time.   #2: Hypertension: Continue present antihypertensive treatment.  Blood pressure stable.   #3: Diabetes mellitus type II with chronic kidney disease   glucose remains elevated at times. Primary team to manage.   #4: Anemia with chronic kidney disease: Hemoglobin remains stable.   #5: Congestive heart failure: Fluid management temporarily maintained with dialysis.  We will restart home regimen of diuretics as above.     LOS: [facility] 8/21/20234:02 PM

## 2022-03-12 DIAGNOSIS — Z87448 Personal history of other diseases of urinary system: Secondary | ICD-10-CM | POA: Diagnosis not present

## 2022-03-12 DIAGNOSIS — Z09 Encounter for follow-up examination after completed treatment for conditions other than malignant neoplasm: Secondary | ICD-10-CM | POA: Diagnosis not present

## 2022-03-13 DIAGNOSIS — J45909 Unspecified asthma, uncomplicated: Secondary | ICD-10-CM | POA: Diagnosis not present

## 2022-03-13 DIAGNOSIS — Z9989 Dependence on other enabling machines and devices: Secondary | ICD-10-CM | POA: Diagnosis not present

## 2022-03-13 DIAGNOSIS — N183 Chronic kidney disease, stage 3 unspecified: Secondary | ICD-10-CM | POA: Diagnosis not present

## 2022-03-13 DIAGNOSIS — I130 Hypertensive heart and chronic kidney disease with heart failure and stage 1 through stage 4 chronic kidney disease, or unspecified chronic kidney disease: Secondary | ICD-10-CM | POA: Diagnosis not present

## 2022-03-13 DIAGNOSIS — E785 Hyperlipidemia, unspecified: Secondary | ICD-10-CM | POA: Diagnosis not present

## 2022-03-13 DIAGNOSIS — I509 Heart failure, unspecified: Secondary | ICD-10-CM | POA: Diagnosis not present

## 2022-03-13 DIAGNOSIS — Z794 Long term (current) use of insulin: Secondary | ICD-10-CM | POA: Diagnosis not present

## 2022-03-13 DIAGNOSIS — G4733 Obstructive sleep apnea (adult) (pediatric): Secondary | ICD-10-CM | POA: Diagnosis not present

## 2022-03-13 DIAGNOSIS — E1122 Type 2 diabetes mellitus with diabetic chronic kidney disease: Secondary | ICD-10-CM | POA: Diagnosis not present

## 2022-03-13 DIAGNOSIS — E1140 Type 2 diabetes mellitus with diabetic neuropathy, unspecified: Secondary | ICD-10-CM | POA: Diagnosis not present

## 2022-03-19 DIAGNOSIS — I5021 Acute systolic (congestive) heart failure: Secondary | ICD-10-CM | POA: Diagnosis not present

## 2022-03-19 DIAGNOSIS — N179 Acute kidney failure, unspecified: Secondary | ICD-10-CM | POA: Diagnosis not present

## 2022-03-19 DIAGNOSIS — N1330 Unspecified hydronephrosis: Secondary | ICD-10-CM | POA: Diagnosis not present

## 2022-03-19 DIAGNOSIS — Z794 Long term (current) use of insulin: Secondary | ICD-10-CM | POA: Diagnosis not present

## 2022-03-19 DIAGNOSIS — E1122 Type 2 diabetes mellitus with diabetic chronic kidney disease: Secondary | ICD-10-CM | POA: Diagnosis not present

## 2022-03-19 DIAGNOSIS — I10 Essential (primary) hypertension: Secondary | ICD-10-CM | POA: Diagnosis not present

## 2022-03-19 DIAGNOSIS — R6889 Other general symptoms and signs: Secondary | ICD-10-CM | POA: Diagnosis not present

## 2022-03-19 DIAGNOSIS — N184 Chronic kidney disease, stage 4 (severe): Secondary | ICD-10-CM | POA: Diagnosis not present

## 2022-03-19 DIAGNOSIS — N200 Calculus of kidney: Secondary | ICD-10-CM | POA: Diagnosis not present

## 2022-03-20 ENCOUNTER — Ambulatory Visit (INDEPENDENT_AMBULATORY_CARE_PROVIDER_SITE_OTHER): Payer: PPO | Admitting: Urology

## 2022-03-20 ENCOUNTER — Encounter: Payer: Self-pay | Admitting: Urology

## 2022-03-20 VITALS — BP 150/80 | HR 74 | Ht 70.0 in | Wt 318.0 lb

## 2022-03-20 DIAGNOSIS — N200 Calculus of kidney: Secondary | ICD-10-CM | POA: Diagnosis not present

## 2022-03-20 LAB — URINALYSIS, COMPLETE
Bilirubin, UA: NEGATIVE
Ketones, UA: NEGATIVE
Nitrite, UA: NEGATIVE
Specific Gravity, UA: 1.025 (ref 1.005–1.030)
Urobilinogen, Ur: 0.2 mg/dL (ref 0.2–1.0)
pH, UA: 6 (ref 5.0–7.5)

## 2022-03-20 LAB — MICROSCOPIC EXAMINATION: RBC, Urine: >30 /hpf — AB (ref 0–2)

## 2022-03-20 MED ORDER — CIPROFLOXACIN HCL 500 MG PO TABS
500.0000 mg | ORAL_TABLET | Freq: Once | ORAL | Status: AC
  Administered 2022-03-20: 500 mg via ORAL

## 2022-03-21 ENCOUNTER — Encounter: Payer: Self-pay | Admitting: Urology

## 2022-03-21 DIAGNOSIS — N184 Chronic kidney disease, stage 4 (severe): Secondary | ICD-10-CM | POA: Diagnosis not present

## 2022-03-21 DIAGNOSIS — E1122 Type 2 diabetes mellitus with diabetic chronic kidney disease: Secondary | ICD-10-CM | POA: Diagnosis not present

## 2022-03-21 NOTE — Progress Notes (Signed)
Indications: Patient is 81 y.o., who is s/p ureteroscopic removal of distal ureteral calculi x3 on 02/27/2022.  He had AKI and creatinine continued to rise during his hospitalization.  He underwent 3 days of inpatient dialysis.  Last creatinine 8/29 was improving at 3.2/GFR 19.  The patient is presenting today for stent removal.  Procedure:  Flexible Cystoscopy with stent removal (26415)  Timeout was performed and the correct patient, procedure and participants were identified.    Description:  The patient was prepped and draped in the usual sterile fashion. Flexible cystosopy was performed.  The stent was visualized, grasped, and removed intact without difficulty. The patient tolerated the procedure well.  A single dose of oral antibiotics was given.  Complications:  None  Plan:  Instructed to call for fever flank pain post stent removal Stone analysis returned 100% uric acid Recommend metabolic evaluation to include blood work and 24-hour urine study.  John Giovanni, MD

## 2022-03-27 ENCOUNTER — Encounter: Payer: Self-pay | Admitting: Urology

## 2022-03-27 DIAGNOSIS — E1122 Type 2 diabetes mellitus with diabetic chronic kidney disease: Secondary | ICD-10-CM | POA: Diagnosis not present

## 2022-03-27 DIAGNOSIS — N184 Chronic kidney disease, stage 4 (severe): Secondary | ICD-10-CM | POA: Diagnosis not present

## 2022-03-27 DIAGNOSIS — D631 Anemia in chronic kidney disease: Secondary | ICD-10-CM | POA: Diagnosis not present

## 2022-03-27 DIAGNOSIS — R809 Proteinuria, unspecified: Secondary | ICD-10-CM | POA: Diagnosis not present

## 2022-03-27 DIAGNOSIS — N2581 Secondary hyperparathyroidism of renal origin: Secondary | ICD-10-CM | POA: Diagnosis not present

## 2022-03-27 DIAGNOSIS — I10 Essential (primary) hypertension: Secondary | ICD-10-CM | POA: Diagnosis not present

## 2022-03-28 ENCOUNTER — Other Ambulatory Visit: Payer: Self-pay | Admitting: *Deleted

## 2022-03-28 DIAGNOSIS — E1122 Type 2 diabetes mellitus with diabetic chronic kidney disease: Secondary | ICD-10-CM | POA: Diagnosis not present

## 2022-03-28 DIAGNOSIS — E1140 Type 2 diabetes mellitus with diabetic neuropathy, unspecified: Secondary | ICD-10-CM | POA: Diagnosis not present

## 2022-03-28 DIAGNOSIS — N184 Chronic kidney disease, stage 4 (severe): Secondary | ICD-10-CM | POA: Diagnosis not present

## 2022-03-28 DIAGNOSIS — E1159 Type 2 diabetes mellitus with other circulatory complications: Secondary | ICD-10-CM | POA: Diagnosis not present

## 2022-03-28 DIAGNOSIS — N200 Calculus of kidney: Secondary | ICD-10-CM

## 2022-03-28 DIAGNOSIS — I10 Essential (primary) hypertension: Secondary | ICD-10-CM | POA: Diagnosis not present

## 2022-03-29 DIAGNOSIS — N183 Chronic kidney disease, stage 3 unspecified: Secondary | ICD-10-CM | POA: Diagnosis not present

## 2022-03-29 DIAGNOSIS — E785 Hyperlipidemia, unspecified: Secondary | ICD-10-CM | POA: Diagnosis not present

## 2022-03-29 DIAGNOSIS — J45909 Unspecified asthma, uncomplicated: Secondary | ICD-10-CM | POA: Diagnosis not present

## 2022-03-29 DIAGNOSIS — I130 Hypertensive heart and chronic kidney disease with heart failure and stage 1 through stage 4 chronic kidney disease, or unspecified chronic kidney disease: Secondary | ICD-10-CM | POA: Diagnosis not present

## 2022-03-29 DIAGNOSIS — Z794 Long term (current) use of insulin: Secondary | ICD-10-CM | POA: Diagnosis not present

## 2022-03-29 DIAGNOSIS — E1122 Type 2 diabetes mellitus with diabetic chronic kidney disease: Secondary | ICD-10-CM | POA: Diagnosis not present

## 2022-03-29 DIAGNOSIS — I509 Heart failure, unspecified: Secondary | ICD-10-CM | POA: Diagnosis not present

## 2022-03-29 DIAGNOSIS — E1140 Type 2 diabetes mellitus with diabetic neuropathy, unspecified: Secondary | ICD-10-CM | POA: Diagnosis not present

## 2022-03-29 DIAGNOSIS — Z9989 Dependence on other enabling machines and devices: Secondary | ICD-10-CM | POA: Diagnosis not present

## 2022-03-29 DIAGNOSIS — G4733 Obstructive sleep apnea (adult) (pediatric): Secondary | ICD-10-CM | POA: Diagnosis not present

## 2022-04-08 ENCOUNTER — Encounter: Payer: Self-pay | Admitting: Urology

## 2022-04-09 DIAGNOSIS — E1122 Type 2 diabetes mellitus with diabetic chronic kidney disease: Secondary | ICD-10-CM | POA: Diagnosis not present

## 2022-04-09 DIAGNOSIS — N184 Chronic kidney disease, stage 4 (severe): Secondary | ICD-10-CM | POA: Diagnosis not present

## 2022-04-10 ENCOUNTER — Other Ambulatory Visit: Payer: PPO

## 2022-04-10 ENCOUNTER — Other Ambulatory Visit: Payer: Self-pay | Admitting: Urology

## 2022-04-10 DIAGNOSIS — N200 Calculus of kidney: Secondary | ICD-10-CM | POA: Diagnosis not present

## 2022-04-12 LAB — LITHOLINK SERUM PANEL
CO2: 24 mmol/L (ref 20–29)
Calcium: 9.1 mg/dL (ref 8.6–10.2)
Chloride: 104 mmol/L (ref 96–106)
Creatinine, Ser: 2.99 mg/dL — ABNORMAL HIGH (ref 0.76–1.27)
Magnesium: 1.8 mg/dL (ref 1.6–2.3)
Phosphorus: 4.3 mg/dL — ABNORMAL HIGH (ref 2.8–4.1)
Potassium: 4.7 mmol/L (ref 3.5–5.2)
Sodium: 141 mmol/L (ref 134–144)
Uric Acid: 5.4 mg/dL (ref 3.8–8.4)
eGFR: 20 mL/min/{1.73_m2} — ABNORMAL LOW (ref >59)

## 2022-04-15 DIAGNOSIS — Z794 Long term (current) use of insulin: Secondary | ICD-10-CM | POA: Diagnosis not present

## 2022-04-15 DIAGNOSIS — N184 Chronic kidney disease, stage 4 (severe): Secondary | ICD-10-CM | POA: Diagnosis not present

## 2022-04-15 DIAGNOSIS — J309 Allergic rhinitis, unspecified: Secondary | ICD-10-CM | POA: Diagnosis not present

## 2022-04-15 DIAGNOSIS — I5021 Acute systolic (congestive) heart failure: Secondary | ICD-10-CM | POA: Diagnosis not present

## 2022-04-15 DIAGNOSIS — E1122 Type 2 diabetes mellitus with diabetic chronic kidney disease: Secondary | ICD-10-CM | POA: Diagnosis not present

## 2022-04-16 DIAGNOSIS — E1140 Type 2 diabetes mellitus with diabetic neuropathy, unspecified: Secondary | ICD-10-CM | POA: Diagnosis not present

## 2022-04-16 DIAGNOSIS — N183 Chronic kidney disease, stage 3 unspecified: Secondary | ICD-10-CM | POA: Diagnosis not present

## 2022-04-16 DIAGNOSIS — J45909 Unspecified asthma, uncomplicated: Secondary | ICD-10-CM | POA: Diagnosis not present

## 2022-04-16 DIAGNOSIS — I509 Heart failure, unspecified: Secondary | ICD-10-CM | POA: Diagnosis not present

## 2022-04-16 DIAGNOSIS — Z794 Long term (current) use of insulin: Secondary | ICD-10-CM | POA: Diagnosis not present

## 2022-04-16 DIAGNOSIS — I130 Hypertensive heart and chronic kidney disease with heart failure and stage 1 through stage 4 chronic kidney disease, or unspecified chronic kidney disease: Secondary | ICD-10-CM | POA: Diagnosis not present

## 2022-04-16 DIAGNOSIS — E783 Hyperchylomicronemia: Secondary | ICD-10-CM | POA: Diagnosis not present

## 2022-04-16 DIAGNOSIS — E1122 Type 2 diabetes mellitus with diabetic chronic kidney disease: Secondary | ICD-10-CM | POA: Diagnosis not present

## 2022-04-16 LAB — LITHOLINK 24HR URINE PANEL
Ammonium, Urine: 25 mmol/24 hr (ref 15–60)
Calcium Oxalate Saturation: 1.1 — ABNORMAL LOW (ref 6.00–10.00)
Calcium Phosphate Saturation: 0.11 — ABNORMAL LOW (ref 0.50–2.00)
Calcium, Urine: 44 mg/24 hr (ref <250)
Calcium/Creatinine Ratio: 37 mg/g creat (ref 34–196)
Calcium/Kg Body Weight: 0.3 mg/24 hr/kg (ref <4.0)
Chloride, Urine: 153 mmol/24 hr (ref 70–250)
Citrate, Urine: 171 mg/24 hr — ABNORMAL LOW (ref >450)
Creatinine, Urine: 1182 mg/24 hr
Creatinine/Kg Body Weight: 8.1 mg/24 hr/kg — ABNORMAL LOW (ref 11.9–24.4)
Cystine, Urine, Qualitative: NEGATIVE
Magnesium, Urine: 83 mg/24 hr (ref 30–120)
Oxalate, Urine: 25 mg/24 hr (ref 20–40)
Phosphorus, Urine: 864 mg/24 hr (ref 600–1200)
Potassium, Urine: 53 mmol/24 hr (ref 20–100)
Protein Catabolic Rate: 0.6 g/kg/24 hr — ABNORMAL LOW (ref 0.8–1.4)
Sodium, Urine: 157 mmol/24 hr — ABNORMAL HIGH (ref 50–150)
Sulfate, Urine: 29 meq/24 hr (ref 20–80)
Urea Nitrogen, Urine: 9.92 g/24 hr (ref 6.00–14.00)
Uric Acid Saturation: 0.64 (ref <1.00)
Uric Acid, Urine: 505 mg/24 hr (ref <800)
Urine Volume (Preserved): 2240 mL/24 hr (ref 500–4000)
pH, 24 hr, Urine: 5.818 (ref 5.800–6.200)

## 2022-04-17 ENCOUNTER — Encounter: Payer: Self-pay | Admitting: *Deleted

## 2022-04-23 ENCOUNTER — Telehealth: Payer: Self-pay | Admitting: *Deleted

## 2022-04-23 DIAGNOSIS — D631 Anemia in chronic kidney disease: Secondary | ICD-10-CM | POA: Diagnosis not present

## 2022-04-23 DIAGNOSIS — E1122 Type 2 diabetes mellitus with diabetic chronic kidney disease: Secondary | ICD-10-CM | POA: Diagnosis not present

## 2022-04-23 DIAGNOSIS — N2581 Secondary hyperparathyroidism of renal origin: Secondary | ICD-10-CM | POA: Diagnosis not present

## 2022-04-23 DIAGNOSIS — N184 Chronic kidney disease, stage 4 (severe): Secondary | ICD-10-CM | POA: Diagnosis not present

## 2022-04-23 DIAGNOSIS — I10 Essential (primary) hypertension: Secondary | ICD-10-CM | POA: Diagnosis not present

## 2022-04-23 MED ORDER — POTASSIUM CITRATE ER 10 MEQ (1080 MG) PO TBCR: 10.0000 meq | EXTENDED_RELEASE_TABLET | Freq: Two times a day (BID) | ORAL | 5 refills | Status: DC

## 2022-04-23 NOTE — Telephone Encounter (Addendum)
Rx sent.  Schedule lab visit for serum potassium in 3-4 weeks unless he is scheduled to have it checked by nephrology

## 2022-04-23 NOTE — Addendum Note (Signed)
Addended by: John Giovanni C on: 04/23/2022 05:10 PM   Modules accepted: Orders

## 2022-04-23 NOTE — Telephone Encounter (Signed)
Patient would like potassium citrate sent into [facility] .

## 2022-04-24 NOTE — Telephone Encounter (Signed)
Notified patient as instructed, patient states his nephrology group is going to check his levels.

## 2022-04-30 ENCOUNTER — Encounter (INDEPENDENT_AMBULATORY_CARE_PROVIDER_SITE_OTHER): Payer: Self-pay | Admitting: Vascular Surgery

## 2022-04-30 ENCOUNTER — Ambulatory Visit (INDEPENDENT_AMBULATORY_CARE_PROVIDER_SITE_OTHER): Payer: PPO | Admitting: Vascular Surgery

## 2022-04-30 VITALS — BP 132/72 | HR 76 | Resp 18 | Wt 324.0 lb

## 2022-04-30 DIAGNOSIS — N183 Chronic kidney disease, stage 3 unspecified: Secondary | ICD-10-CM

## 2022-04-30 DIAGNOSIS — M7989 Other specified soft tissue disorders: Secondary | ICD-10-CM | POA: Insufficient documentation

## 2022-04-30 DIAGNOSIS — E1129 Type 2 diabetes mellitus with other diabetic kidney complication: Secondary | ICD-10-CM | POA: Diagnosis not present

## 2022-04-30 DIAGNOSIS — I10 Essential (primary) hypertension: Secondary | ICD-10-CM

## 2022-04-30 DIAGNOSIS — E785 Hyperlipidemia, unspecified: Secondary | ICD-10-CM

## 2022-04-30 NOTE — Assessment & Plan Note (Signed)
The patient swelling is under much better control with the daily use of compression socks and elevation.  His swelling is very mild at this point.  Continue conservative measures.  Recheck in 1 year.

## 2022-04-30 NOTE — Assessment & Plan Note (Signed)
blood glucose control important in reducing the progression of atherosclerotic disease. Also, involved in wound healing. On appropriate medications.  

## 2022-04-30 NOTE — Progress Notes (Signed)
MRN : [id number]  James Clarke is a 81 y.o. (12-13-1940) male who presents with chief complaint of  Chief Complaint  Patient presents with   Follow-up    27yrfollow up  .  History of Present Illness: Patient returns today in follow up of his lower extremity swelling.  He has worn his compression socks daily now since his last visit and this has resulted in marked improvement in his lower extremity swelling.  No open wounds.  No infection.  No fevers or chills.  He has had previous episodes of cellulitis but none since he has been wearing his compression socks daily.  Overall he seems to be doing well.  Current Outpatient Medications  Medication Sig Dispense Refill   acetaminophen (TYLENOL) 500 MG tablet Take by mouth.     albuterol (PROVENTIL HFA;VENTOLIN HFA) 108 (90 Base) MCG/ACT inhaler Inhale 2 puffs into the lungs every 6 (six) hours as needed for wheezing or shortness of breath.     amLODipine (NORVASC) 10 MG tablet Take 1 tablet (10 mg total) by mouth daily. 30 tablet 1   aspirin EC 81 MG tablet Take by mouth.     calcitRIOL (ROCALTROL) 0.25 MCG capsule Take 0.25 mcg by mouth daily.     cefdinir (OMNICEF) 300 MG capsule Take by mouth.     cetirizine (ZYRTEC) 10 MG chewable tablet Chew by mouth.     clotrimazole (LOTRIMIN) 1 % cream Apply 1 Application topically daily as needed.     fluticasone (FLONASE) 50 MCG/ACT nasal spray as needed.     furosemide (LASIX) 40 MG tablet Take 1 tablet (40 mg total) by mouth every other day. 30 tablet 1   GLOBAL INJECT EASE INSULIN SYR 31G X 5/16" 0.5 ML MISC Inject into the skin.     glucose blood (ONETOUCH ULTRA) test strip TEST BLOOD SUGAR 4 TIMES DAILY     insulin aspart (NOVOLOG) 100 UNIT/ML injection Inject 30 units before breakfast and lunch. Inject 55 units before supper.     insulin detemir (LEVEMIR) 100 UNIT/ML injection Inject 0.3 mLs (30 Units total) into the skin daily. (Patient taking differently: Inject 50 Units into the skin  at bedtime.) 10 mL 11   Lancets (ONETOUCH DELICA PLUS [id number] MISC USE 1 LANCET 4 TIMES DAILY     lovastatin (MEVACOR) 40 MG tablet Take 40 mg by mouth daily with supper.     metoprolol succinate (TOPROL-XL) 50 MG 24 hr tablet Take 50 mg by mouth daily.     oxybutynin (DITROPAN) 5 MG tablet Take 1 tablet (5 mg total) by mouth every 8 (eight) hours as needed for bladder spasms. 30 tablet 0   tamsulosin (FLOMAX) 0.4 MG CAPS capsule Take 1 capsule (0.4 mg total) by mouth daily. 30 capsule 1   traMADol (ULTRAM) 50 MG tablet Take by mouth.     potassium citrate (UROCIT-K) 10 MEQ (1080 MG) SR tablet Take 1 tablet (10 mEq total) by mouth 2 (two) times daily with a meal. 60 tablet 5   No current facility-administered medications for this visit.    Past Medical History:  Diagnosis Date   Asthma    Bell's palsy    SEVERAL TIMES   Bell's palsy    Cancer ([place]    SKIN   Diabetes mellitus without complication ([place]    Dyspnea    DOE   History of kidney stones    Hyperlipemia    Hypertension    Kidney stone  Neuropathy    Obesity    Sleep apnea    C PAP    Past Surgical History:  Procedure Laterality Date   APPENDECTOMY     CATARACT EXTRACTION W/PHACO Left 05/20/2017   Procedure: CATARACT EXTRACTION PHACO AND INTRAOCULAR LENS PLACEMENT (IOC);  Surgeon: Birder Robson, MD;  Location: [facility];  Service: Ophthalmology;  Laterality: Left;  [place] 00:32.0 AP% 15.5 CDE 4.97 Fluid Pack Lot # [id number]   CATARACT EXTRACTION W/PHACO Right 06/10/2017   Procedure: CATARACT EXTRACTION PHACO AND INTRAOCULAR LENS PLACEMENT (IOC);  Surgeon: Birder Robson, MD;  Location: [facility];  Service: Ophthalmology;  Laterality: Right;  [place]  00:51 AP% 15.4 CDE 7.95 Fluid pack lot # [id number]   CHOLECYSTECTOMY     CYSTOSCOPY W/ RETROGRADES Right 08/13/2019   Procedure: CYSTOSCOPY WITH RETROGRADE PYELOGRAM;  Surgeon: Billey Co, MD;  Location: [facility];  Service: Urology;  Laterality: Right;    CYSTOSCOPY WITH STENT PLACEMENT Right 07/26/2019   Procedure: CYSTOSCOPY WITH STENT PLACEMENT;  Surgeon: Lucas Mallow, MD;  Location: [facility];  Service: Urology;  Laterality: Right;   CYSTOSCOPY/URETEROSCOPY/HOLMIUM LASER/STENT PLACEMENT Right 08/13/2019   Procedure: CYSTOSCOPY/URETEROSCOPY/HOLMIUM LASER/STENT EXCHANGE;  Surgeon: Billey Co, MD;  Location: [facility];  Service: Urology;  Laterality: Right;   CYSTOSCOPY/URETEROSCOPY/HOLMIUM LASER/STENT PLACEMENT Left 02/27/2022   Procedure: CYSTOSCOPY/URETEROSCOPY/HOLMIUM LASER/STENT PLACEMENT;  Surgeon: Abbie Sons, MD;  Location: [facility];  Service: Urology;  Laterality: Left;   FRACTURE SURGERY     WRIST   HAND SURGERY     HERNIA REPAIR     LEFT HEART CATH AND CORONARY ANGIOGRAPHY N/A 12/31/2021   Procedure: LEFT HEART CATH AND CORONARY ANGIOGRAPHY;  Surgeon: Corey Skains, MD;  Location: [place] [facility];  Service: Cardiovascular;  Laterality: N/A;   SHOULDER ARTHROSCOPY WITH SUBACROMIAL DECOMPRESSION AND OPEN ROTATOR C Right 09/11/2020   Procedure: Right shoulder arthroscopic rotator cuff repair vs Regeneten patch application, subacromial decompression, and biceps tenodesis - Reche Dixon to Assist;  Surgeon: Leim Fabry, MD;  Location: [facility];  Service: Orthopedics;  Laterality: Right;   TEMPORARY DIALYSIS CATHETER N/A 03/05/2022   Procedure: TEMPORARY DIALYSIS CATHETER;  Surgeon: Katha Cabal, MD;  Location: [place] [facility];  Service: Cardiovascular;  Laterality: N/A;   TONSILLECTOMY     URETEROSCOPY WITH HOLMIUM LASER LITHOTRIPSY Left 02/27/2022   Procedure: URETEROSCOPY WITH HOLMIUM LASER LITHOTRIPSY;  Surgeon: Abbie Sons, MD;  Location: [facility];  Service: Urology;  Laterality: Left;     Social History   Tobacco Use   Smoking status: Never    Passive exposure: Never   Smokeless tobacco: Never  Vaping Use   Vaping Use: Never used  Substance Use Topics   Alcohol use: No   Drug use: Not Currently       Family History  Problem Relation Age of Onset   Emphysema Mother    COPD Mother    Heart disease Mother    Brain cancer Father      Allergies  Allergen Reactions   Codeine Nausea And Vomiting   Doxycycline    Erythromycin Rash     REVIEW OF SYSTEMS (Negative unless checked)  Constitutional: []Weight loss  []Fever  []Chills Cardiac: []Chest pain   []Chest pressure   []Palpitations   []Shortness of breath when laying flat   []Shortness of breath at rest   []Shortness of breath with exertion. Vascular:  []Pain in legs with walking   []Pain in legs at rest   []Pain  in legs when laying flat   []Claudication   []Pain in feet when walking  []Pain in feet at rest  []Pain in feet when laying flat   []History of DVT   []Phlebitis   [x]Swelling in legs   []Varicose veins   []Non-healing ulcers Pulmonary:   []Uses home oxygen   []Productive cough   []Hemoptysis   []Wheeze  []COPD   []Asthma Neurologic:  []Dizziness  []Blackouts   []Seizures   []History of stroke   []History of TIA  []Aphasia   []Temporary blindness   []Dysphagia   []Weakness or numbness in arms   []Weakness or numbness in legs Musculoskeletal:  [x]Arthritis   []Joint swelling   [x]Joint pain   []Low back pain Hematologic:  []Easy bruising  []Easy bleeding   []Hypercoagulable state   []Anemic   Gastrointestinal:  []Blood in stool   []Vomiting blood  []Gastroesophageal reflux/heartburn   []Abdominal pain Genitourinary:  []Chronic kidney disease   []Difficult urination  []Frequent urination  []Burning with urination   []Hematuria Skin:  []Rashes   []Ulcers   []Wounds Psychological:  []History of anxiety   [] History of major depression.  Physical Examination  BP 132/72 (BP Location: Left Arm)   Pulse 76   Resp 18   Wt (!) 324 lb (147 kg)   BMI 46.49 kg/m  Gen:  WD/WN, NAD.  Obese Head: [place]/AT, No temporalis wasting. Ear/Nose/Throat: Hearing grossly intact, nares w/o erythema or drainage Eyes: Conjunctiva clear.  Sclera non-icteric Neck: Supple.  Trachea midline Pulmonary:  Good air movement, no use of accessory muscles.  Cardiac: RRR, no JVD Vascular:  Vessel Right Left  Radial Palpable Palpable       Musculoskeletal: M/S 5/5 throughout.  No deformity or atrophy.  1+ right lower extremity edema, trace left lower extremity edema. Neurologic: Sensation grossly intact in extremities.  Symmetrical.  Speech is fluent.  Psychiatric: Judgment intact, Mood & affect appropriate for pt's clinical situation. Dermatologic: No rashes or ulcers noted.  No cellulitis or open wounds.      Labs Recent Results (from the past 2160 hour(s))  Urinalysis, Routine w reflex microscopic Urine, Clean Catch     Status: Abnormal   Collection Time: 02/25/22  9:47 AM  Result Value Ref Range   Color, Urine YELLOW YELLOW   APPearance CLEAR CLEAR   Specific Gravity, Urine 1.020 1.005 - 1.030   pH 5.5 5.0 - 8.0   Glucose, UA 250 (A) NEGATIVE mg/dL   Hgb urine dipstick SMALL (A) NEGATIVE   Bilirubin Urine NEGATIVE NEGATIVE   Ketones, ur NEGATIVE NEGATIVE mg/dL   Protein, ur >300 (A) NEGATIVE mg/dL   Nitrite NEGATIVE NEGATIVE   Leukocytes,Ua NEGATIVE NEGATIVE    Comment: Performed at [facility], [address] [place], [place] [address]  Urinalysis, Microscopic (reflex)     Status: None   Collection Time: 02/25/22  9:47 AM  Result Value Ref Range   RBC / HPF 0-5 0 - 5 RBC/hpf   WBC, UA 0-5 0 - 5 WBC/hpf   Bacteria, UA NONE SEEN NONE SEEN   Squamous Epithelial / LPF 0-5 0 - 5    Comment: Performed at [facility], [address] [place], [place] [address]  Comprehensive metabolic panel     Status: Abnormal   Collection Time: 02/25/22 10:05 AM  Result Value Ref Range   Sodium 135 135 - 145 mmol/L   Potassium 3.9 3.5 - 5.1 mmol/L   Chloride 105 98 -  111 mmol/L   CO2 23 22 - 32 mmol/L   Glucose, Bld 118 (H) 70 - 99 mg/dL    Comment: Glucose reference range applies only to samples  taken after fasting for at least 8 hours.   BUN 80 (H) 8 - 23 mg/dL   Creatinine, Ser 5.61 (H) 0.61 - 1.24 mg/dL   Calcium 8.4 (L) 8.9 - 10.3 mg/dL   Total Protein 7.2 6.5 - 8.1 g/dL   Albumin 2.8 (L) 3.5 - 5.0 g/dL   AST 19 15 - 41 U/L   ALT 14 0 - 44 U/L   Alkaline Phosphatase 71 38 - 126 U/L   Total Bilirubin 0.5 0.3 - 1.2 mg/dL   GFR, Estimated 10 (L) >60 mL/min    Comment: (NOTE) Calculated using the CKD-EPI Creatinine Equation (2021)    Anion gap 7 5 - 15    Comment: Performed at [facility], [address] [place], [place] [address]  Brain natriuretic peptide     Status: Abnormal   Collection Time: 02/25/22 10:05 AM  Result Value Ref Range   B Natriuretic Peptide 401.9 (H) 0.0 - 100.0 pg/mL    Comment: Performed at [facility], [address] [place], [place] [address]  CBC with Differential     Status: Abnormal   Collection Time: 02/25/22  1:16 PM  Result Value Ref Range   WBC 13.3 (H) 4.0 - 10.5 K/uL   RBC 3.96 (L) 4.22 - 5.81 MIL/uL   Hemoglobin 11.3 (L) 13.0 - 17.0 g/dL   HCT 36.2 (L) 39.0 - 52.0 %   MCV 91.4 80.0 - 100.0 fL   MCH 28.5 26.0 - 34.0 pg   MCHC 31.2 30.0 - 36.0 g/dL   RDW 14.0 11.5 - 15.5 %   Platelets 290 150 - 400 K/uL   nRBC 0.0 0.0 - 0.2 %   Neutrophils Relative % 75 %   Neutro Abs 9.9 (H) 1.7 - 7.7 K/uL   Lymphocytes Relative 16 %   Lymphs Abs 2.1 0.7 - 4.0 K/uL   Monocytes Relative 8 %   Monocytes Absolute 1.0 0.1 - 1.0 K/uL   Eosinophils Relative 1 %   Eosinophils Absolute 0.2 0.0 - 0.5 K/uL   Basophils Relative 0 %   Basophils Absolute 0.0 0.0 - 0.1 K/uL   Immature Granulocytes 0 %   Abs Immature Granulocytes 0.05 0.00 - 0.07 K/uL    Comment: Performed at [facility], [address], [place], [place] [address]  Magnesium     Status: None   Collection Time: 02/25/22  1:16 PM  Result Value Ref Range   Magnesium 2.2 1.7 - 2.4 mg/dL    Comment: Performed at [facility], [address] [place], [place] [address]  Phosphorus     Status: Abnormal   Collection Time: 02/25/22  1:16 PM  Result Value Ref Range   Phosphorus 5.4 (H) 2.5 - 4.6 mg/dL    Comment: Performed at [facility], [address] [place], [place] [address]  Hemoglobin A1c     Status: Abnormal   Collection Time: 02/25/22  1:16 PM  Result Value Ref Range   Hgb A1c MFr Bld 6.4 (H) 4.8 - 5.6 %    Comment: (NOTE) Pre diabetes:          5.7%-6.4%  Diabetes:              >6.4%  Glycemic control for   <7.0% adults with diabetes  Mean Plasma Glucose 136.98 mg/dL    Comment: Performed at [facility] [address], [place], [place] [address]  TSH     Status: None   Collection Time: 02/25/22  1:16 PM  Result Value Ref Range   TSH 4.013 0.350 - 4.500 uIU/mL    Comment: Performed by a 3rd Generation assay with a functional sensitivity of <=0.01 uIU/mL. Performed at [facility], [address] [place], [place] [address]   T4, free     Status: None   Collection Time: 02/25/22  1:16 PM  Result Value Ref Range   Free T4 0.66 0.61 - 1.12 ng/dL    Comment: (NOTE) Biotin ingestion may interfere with free T4 tests. If the results are inconsistent with the TSH level, previous test results, or the clinical presentation, then consider biotin interference. If needed, order repeat testing after stopping biotin. Performed at [facility], [address] [place], [place] [address]   CBG monitoring, ED     Status: Abnormal   Collection Time: 02/25/22  3:09 PM  Result Value Ref Range   Glucose-Capillary 43 (LL) 70 - 99 mg/dL    Comment: Glucose reference range applies only to samples taken after fasting for at least 8 hours.  CBG monitoring, ED     Status: None   Collection Time: 02/25/22  3:57 PM  Result Value Ref Range   Glucose-Capillary 96 70 - 99 mg/dL    Comment: Glucose reference range applies only to samples taken after fasting for at least 8 hours.  CBG  monitoring, ED     Status: Abnormal   Collection Time: 02/25/22  8:46 PM  Result Value Ref Range   Glucose-Capillary 215 (H) 70 - 99 mg/dL    Comment: Glucose reference range applies only to samples taken after fasting for at least 8 hours.   Comment 1 Notify RN    Comment 2 Call MD NNP PA CNM   CBG monitoring, ED     Status: Abnormal   Collection Time: 02/26/22  8:39 AM  Result Value Ref Range   Glucose-Capillary 272 (H) 70 - 99 mg/dL    Comment: Glucose reference range applies only to samples taken after fasting for at least 8 hours.  Comprehensive metabolic panel     Status: Abnormal   Collection Time: 02/26/22  9:48 AM  Result Value Ref Range   Sodium 138 135 - 145 mmol/L   Potassium 4.3 3.5 - 5.1 mmol/L   Chloride 108 98 - 111 mmol/L   CO2 20 (L) 22 - 32 mmol/L   Glucose, Bld 296 (H) 70 - 99 mg/dL    Comment: Glucose reference range applies only to samples taken after fasting for at least 8 hours.   BUN 67 (H) 8 - 23 mg/dL   Creatinine, Ser 5.57 (H) 0.61 - 1.24 mg/dL   Calcium 8.4 (L) 8.9 - 10.3 mg/dL   Total Protein 7.0 6.5 - 8.1 g/dL   Albumin 2.7 (L) 3.5 - 5.0 g/dL   AST 21 15 - 41 U/L   ALT 15 0 - 44 U/L   Alkaline Phosphatase 69 38 - 126 U/L   Total Bilirubin 0.7 0.3 - 1.2 mg/dL   GFR, Estimated 10 (L) >60 mL/min    Comment: (NOTE) Calculated using the CKD-EPI Creatinine Equation (2021)    Anion gap 10 5 - 15    Comment: Performed at [facility], [address], [place], [place] [address]  CBC  Status: Abnormal   Collection Time: 02/26/22  9:48 AM  Result Value Ref Range   WBC 10.3 4.0 - 10.5 K/uL   RBC 4.10 (L) 4.22 - 5.81 MIL/uL   Hemoglobin 11.7 (L) 13.0 - 17.0 g/dL   HCT 37.0 (L) 39.0 - 52.0 %   MCV 90.2 80.0 - 100.0 fL   MCH 28.5 26.0 - 34.0 pg   MCHC 31.6 30.0 - 36.0 g/dL   RDW 14.0 11.5 - 15.5 %   Platelets 280 150 - 400 K/uL   nRBC 0.0 0.0 - 0.2 %    Comment: Performed at [facility], [address] [place], [place]  [address]  Glucose, capillary     Status: Abnormal   Collection Time: 02/26/22 12:05 PM  Result Value Ref Range   Glucose-Capillary 256 (H) 70 - 99 mg/dL    Comment: Glucose reference range applies only to samples taken after fasting for at least 8 hours.  ECHOCARDIOGRAM COMPLETE     Status: None   Collection Time: 02/26/22 12:13 PM  Result Value Ref Range   Weight 5,255.77 oz   Height 70 in   BP 170/88 mmHg   Ao pk vel 1.51 m/s   AV Area VTI 2.56 cm2   AR max vel 2.10 cm2   AV Mean grad 6.0 mmHg   AV Peak grad 9.1 mmHg   S' Lateral 3.60 cm   AV Area mean vel 1.98 cm2   Area-P 1/2 4.57 cm2  Glucose, capillary     Status: Abnormal   Collection Time: 02/26/22  4:54 PM  Result Value Ref Range   Glucose-Capillary 188 (H) 70 - 99 mg/dL    Comment: Glucose reference range applies only to samples taken after fasting for at least 8 hours.   Comment 1 Notify RN   Glucose, capillary     Status: Abnormal   Collection Time: 02/26/22  9:38 PM  Result Value Ref Range   Glucose-Capillary 245 (H) 70 - 99 mg/dL    Comment: Glucose reference range applies only to samples taken after fasting for at least 8 hours.  Basic metabolic panel     Status: Abnormal   Collection Time: 02/27/22  4:10 AM  Result Value Ref Range   Sodium 139 135 - 145 mmol/L   Potassium 4.0 3.5 - 5.1 mmol/L   Chloride 110 98 - 111 mmol/L   CO2 21 (L) 22 - 32 mmol/L   Glucose, Bld 230 (H) 70 - 99 mg/dL    Comment: Glucose reference range applies only to samples taken after fasting for at least 8 hours.   BUN 83 (H) 8 - 23 mg/dL   Creatinine, Ser 5.87 (H) 0.61 - 1.24 mg/dL   Calcium 8.0 (L) 8.9 - 10.3 mg/dL   GFR, Estimated 9 (L) >60 mL/min    Comment: (NOTE) Calculated using the CKD-EPI Creatinine Equation (2021)    Anion gap 8 5 - 15    Comment: Performed at [facility], [address] [place], [place] [address]  CBC     Status: Abnormal   Collection Time: 02/27/22  4:10 AM  Result Value Ref Range   WBC  9.5 4.0 - 10.5 K/uL   RBC 3.51 (L) 4.22 - 5.81 MIL/uL   Hemoglobin 10.0 (L) 13.0 - 17.0 g/dL   HCT 31.4 (L) 39.0 - 52.0 %   MCV 89.5 80.0 - 100.0 fL   MCH 28.5 26.0 - 34.0 pg   MCHC 31.8 30.0 - 36.0  g/dL   RDW 13.8 11.5 - 15.5 %   Platelets 278 150 - 400 K/uL   nRBC 0.0 0.0 - 0.2 %    Comment: Performed at [facility], [address] [place], [place] [address]  Magnesium     Status: None   Collection Time: 02/27/22  4:10 AM  Result Value Ref Range   Magnesium 2.3 1.7 - 2.4 mg/dL    Comment: Performed at [facility], [address] [place], [place] [address]  Glucose, capillary     Status: Abnormal   Collection Time: 02/27/22  7:21 AM  Result Value Ref Range   Glucose-Capillary 225 (H) 70 - 99 mg/dL    Comment: Glucose reference range applies only to samples taken after fasting for at least 8 hours.  Glucose, capillary     Status: Abnormal   Collection Time: 02/27/22  9:14 AM  Result Value Ref Range   Glucose-Capillary 233 (H) 70 - 99 mg/dL    Comment: Glucose reference range applies only to samples taken after fasting for at least 8 hours.  Calculi, with Photograph (to Clinical Lab)     Status: None   Collection Time: 02/27/22 10:39 AM  Result Value Ref Range   Source Calculi Comment     Comment: Left Ureter CORRECTED ON 08/16 AT 1637: PREVIOUSLY REPORTED AS KIDNEY STONE    Color Calculi Orange    Size Calculi 2x2 mm    Comment: (NOTE) Multiple pieces received.  Dimensions of the largest piece reported.    Weight Calculi 14 mg   Composition Calculi Comment     Comment: Percentage (Represents the % composition)   Uric Acid Calculi 100 %   Photo Calculi Comment     Comment: Photograph will follow under a separate cover   Comment Calculi 3 Comment     Comment: (NOTE) Physician questions regarding Calculi Analysis contact [facility] at: [phone].    Please Note: Comment     Comment: (NOTE) Calculi report will follow via computer, mail or  courier delivery.    DISCLAIMER: Comment     Comment: (NOTE) This test was developed and its performance characteristics determined by [facility].  It has not been cleared or approved by the Food and Drug Administration. Performed At: [facility] [place] [id number] Brock Ra PhD [id number]   Glucose, capillary     Status: Abnormal   Collection Time: 02/27/22 11:05 AM  Result Value Ref Range   Glucose-Capillary 176 (H) 70 - 99 mg/dL    Comment: Glucose reference range applies only to samples taken after fasting for at least 8 hours.  Glucose, capillary     Status: Abnormal   Collection Time: 02/27/22 11:43 AM  Result Value Ref Range   Glucose-Capillary 187 (H) 70 - 99 mg/dL    Comment: Glucose reference range applies only to samples taken after fasting for at least 8 hours.  Glucose, capillary     Status: Abnormal   Collection Time: 02/27/22  4:28 PM  Result Value Ref Range   Glucose-Capillary 268 (H) 70 - 99 mg/dL    Comment: Glucose reference range applies only to samples taken after fasting for at least 8 hours.  Glucose, capillary     Status: Abnormal   Collection Time: 02/27/22  8:42 PM  Result Value Ref Range   Glucose-Capillary 289 (H) 70 - 99 mg/dL    Comment: Glucose reference range applies only to samples taken after fasting for at least  8 hours.  Basic metabolic panel     Status: Abnormal   Collection Time: 02/28/22  4:02 AM  Result Value Ref Range   Sodium 138 135 - 145 mmol/L   Potassium 4.5 3.5 - 5.1 mmol/L   Chloride 108 98 - 111 mmol/L   CO2 21 (L) 22 - 32 mmol/L   Glucose, Bld 226 (H) 70 - 99 mg/dL    Comment: Glucose reference range applies only to samples taken after fasting for at least 8 hours.   BUN 89 (H) 8 - 23 mg/dL   Creatinine, Ser 5.91 (H) 0.61 - 1.24 mg/dL   Calcium 8.0 (L) 8.9 - 10.3 mg/dL   GFR, Estimated 9 (L) >60 mL/min    Comment: (NOTE) Calculated using the CKD-EPI Creatinine Equation (2021)    Anion  gap 9 5 - 15    Comment: Performed at [facility], [address] [place], [place] [address]  CBC     Status: Abnormal   Collection Time: 02/28/22  4:02 AM  Result Value Ref Range   WBC 12.8 (H) 4.0 - 10.5 K/uL   RBC 3.65 (L) 4.22 - 5.81 MIL/uL   Hemoglobin 10.4 (L) 13.0 - 17.0 g/dL   HCT 32.9 (L) 39.0 - 52.0 %   MCV 90.1 80.0 - 100.0 fL   MCH 28.5 26.0 - 34.0 pg   MCHC 31.6 30.0 - 36.0 g/dL   RDW 13.5 11.5 - 15.5 %   Platelets 299 150 - 400 K/uL   nRBC 0.0 0.0 - 0.2 %    Comment: Performed at [facility], [address] [place], [place] [address]  Magnesium     Status: None   Collection Time: 02/28/22  4:02 AM  Result Value Ref Range   Magnesium 2.3 1.7 - 2.4 mg/dL    Comment: Performed at [facility], [address] [place], [place] [address]  Glucose, capillary     Status: Abnormal   Collection Time: 02/28/22  7:30 AM  Result Value Ref Range   Glucose-Capillary 258 (H) 70 - 99 mg/dL    Comment: Glucose reference range applies only to samples taken after fasting for at least 8 hours.  Sodium, urine, random     Status: None   Collection Time: 02/28/22  9:58 AM  Result Value Ref Range   Sodium, Ur 41 mmol/L    Comment: Performed at [facility], [address] [place], [place] [address]  Creatinine, urine, random     Status: None   Collection Time: 02/28/22  9:58 AM  Result Value Ref Range   Creatinine, Urine 95 mg/dL    Comment: Performed at [facility], [address] [place], [place] [address]  Urinalysis, Routine w reflex microscopic Urine, Catheterized     Status: Abnormal   Collection Time: 02/28/22  9:58 AM  Result Value Ref Range   Color, Urine YELLOW (A) YELLOW   APPearance CLOUDY (A) CLEAR   Specific Gravity, Urine 1.013 1.005 - 1.030   pH 5.0 5.0 - 8.0   Glucose, UA >=500 (A) NEGATIVE mg/dL   Hgb urine dipstick LARGE (A) NEGATIVE   Bilirubin Urine NEGATIVE NEGATIVE   Ketones, ur 5 (A) NEGATIVE mg/dL    Protein, ur >=300 (A) NEGATIVE mg/dL   Nitrite NEGATIVE NEGATIVE   Leukocytes,Ua MODERATE (A) NEGATIVE   RBC / HPF >50 (H) 0 - 5 RBC/hpf   WBC, UA >50 (H) 0 - 5 WBC/hpf   Bacteria, UA FEW (A)  NONE SEEN   Squamous Epithelial / LPF NONE SEEN 0 - 5   WBC Clumps PRESENT    Amorphous Crystal PRESENT     Comment: Performed at [facility], [address] [place], [place] [address]  Glucose, capillary     Status: Abnormal   Collection Time: 02/28/22 11:31 AM  Result Value Ref Range   Glucose-Capillary 263 (H) 70 - 99 mg/dL    Comment: Glucose reference range applies only to samples taken after fasting for at least 8 hours.  Urine Culture     Status: None   Collection Time: 02/28/22 12:50 PM   Specimen: Urine, Catheterized  Result Value Ref Range   Specimen Description      URINE, CATHETERIZED Performed at [facility], [address], [place], [place] [address]    Special Requests      NONE Performed at [facility] [facility], [address] [place], [place] [address]    Culture      NO GROWTH Performed at [facility], [place] [address] [place], [place] [address]    Report Status 03/01/2022 FINAL   Glucose, capillary     Status: Abnormal   Collection Time: 02/28/22  4:32 PM  Result Value Ref Range   Glucose-Capillary 205 (H) 70 - 99 mg/dL    Comment: Glucose reference range applies only to samples taken after fasting for at least 8 hours.  Glucose, capillary     Status: Abnormal   Collection Time: 02/28/22  8:56 PM  Result Value Ref Range   Glucose-Capillary 187 (H) 70 - 99 mg/dL    Comment: Glucose reference range applies only to samples taken after fasting for at least 8 hours.  Basic metabolic panel     Status: Abnormal   Collection Time: 03/01/22  3:53 AM  Result Value Ref Range   Sodium 139 135 - 145 mmol/L   Potassium 4.5 3.5 - 5.1 mmol/L   Chloride 110 98 - 111 mmol/L   CO2 20 (L) 22 - 32 mmol/L   Glucose, Bld 141 (H) 70 - 99 mg/dL     Comment: Glucose reference range applies only to samples taken after fasting for at least 8 hours.   BUN 93 (H) 8 - 23 mg/dL   Creatinine, Ser 5.74 (H) 0.61 - 1.24 mg/dL   Calcium 7.9 (L) 8.9 - 10.3 mg/dL   GFR, Estimated 9 (L) >60 mL/min    Comment: (NOTE) Calculated using the CKD-EPI Creatinine Equation (2021)    Anion gap 9 5 - 15    Comment: Performed at [facility], [address] [place], [place] [address]  CBC     Status: Abnormal   Collection Time: 03/01/22  3:53 AM  Result Value Ref Range   WBC 12.2 (H) 4.0 - 10.5 K/uL   RBC 3.79 (L) 4.22 - 5.81 MIL/uL   Hemoglobin 10.7 (L) 13.0 - 17.0 g/dL   HCT 34.1 (L) 39.0 - 52.0 %   MCV 90.0 80.0 - 100.0 fL   MCH 28.2 26.0 - 34.0 pg   MCHC 31.4 30.0 - 36.0 g/dL   RDW 13.9 11.5 - 15.5 %   Platelets 226 150 - 400 K/uL   nRBC 0.0 0.0 - 0.2 %    Comment: Performed at [facility], [address], [place],  [address]  Magnesium     Status: None   Collection Time: 03/01/22  3:53 AM  Result Value Ref Range   Magnesium 2.2 1.7 -  2.4 mg/dL    Comment: Performed at [facility], [address] [place], [place] [address]  Glucose, capillary     Status: Abnormal   Collection Time: 03/01/22  8:03 AM  Result Value Ref Range   Glucose-Capillary 195 (H) 70 - 99 mg/dL    Comment: Glucose reference range applies only to samples taken after fasting for at least 8 hours.  Glucose, capillary     Status: Abnormal   Collection Time: 03/01/22 11:56 AM  Result Value Ref Range   Glucose-Capillary 268 (H) 70 - 99 mg/dL    Comment: Glucose reference range applies only to samples taken after fasting for at least 8 hours.  Glucose, capillary     Status: Abnormal   Collection Time: 03/01/22  5:44 PM  Result Value Ref Range   Glucose-Capillary 128 (H) 70 - 99 mg/dL    Comment: Glucose reference range applies only to samples taken after fasting for at least 8 hours.  Glucose, capillary     Status: Abnormal    Collection Time: 03/01/22  8:57 PM  Result Value Ref Range   Glucose-Capillary 213 (H) 70 - 99 mg/dL    Comment: Glucose reference range applies only to samples taken after fasting for at least 8 hours.  Magnesium     Status: None   Collection Time: 03/02/22  6:00 AM  Result Value Ref Range   Magnesium 2.2 1.7 - 2.4 mg/dL    Comment: Performed at [facility], [address] [place], [place] [address]  Basic metabolic panel     Status: Abnormal   Collection Time: 03/02/22  6:00 AM  Result Value Ref Range   Sodium 139 135 - 145 mmol/L   Potassium 4.2 3.5 - 5.1 mmol/L   Chloride 110 98 - 111 mmol/L   CO2 20 (L) 22 - 32 mmol/L   Glucose, Bld 199 (H) 70 - 99 mg/dL    Comment: Glucose reference range applies only to samples taken after fasting for at least 8 hours.   BUN 93 (H) 8 - 23 mg/dL   Creatinine, Ser 5.50 (H) 0.61 - 1.24 mg/dL   Calcium 7.9 (L) 8.9 - 10.3 mg/dL   GFR, Estimated 10 (L) >60 mL/min    Comment: (NOTE) Calculated using the CKD-EPI Creatinine Equation (2021)    Anion gap 9 5 - 15    Comment: Performed at [facility], [address] [place], [place] [address]  Glucose, capillary     Status: Abnormal   Collection Time: 03/02/22  8:13 AM  Result Value Ref Range   Glucose-Capillary 193 (H) 70 - 99 mg/dL    Comment: Glucose reference range applies only to samples taken after fasting for at least 8 hours.  Glucose, capillary     Status: Abnormal   Collection Time: 03/02/22 11:38 AM  Result Value Ref Range   Glucose-Capillary 304 (H) 70 - 99 mg/dL    Comment: Glucose reference range applies only to samples taken after fasting for at least 8 hours.  Glucose, capillary     Status: Abnormal   Collection Time: 03/02/22  5:02 PM  Result Value Ref Range   Glucose-Capillary 140 (H) 70 - 99 mg/dL    Comment: Glucose reference range applies only to samples taken after fasting for at least 8 hours.  Glucose, capillary     Status: Abnormal   Collection  Time: 03/02/22  9:07 PM  Result Value Ref Range   Glucose-Capillary 119 (H) 70 -  99 mg/dL    Comment: Glucose reference range applies only to samples taken after fasting for at least 8 hours.  Basic metabolic panel     Status: Abnormal   Collection Time: 03/03/22  5:15 AM  Result Value Ref Range   Sodium 141 135 - 145 mmol/L   Potassium 3.9 3.5 - 5.1 mmol/L   Chloride 113 (H) 98 - 111 mmol/L   CO2 21 (L) 22 - 32 mmol/L   Glucose, Bld 154 (H) 70 - 99 mg/dL    Comment: Glucose reference range applies only to samples taken after fasting for at least 8 hours.   BUN 90 (H) 8 - 23 mg/dL   Creatinine, Ser 5.14 (H) 0.61 - 1.24 mg/dL   Calcium 8.0 (L) 8.9 - 10.3 mg/dL   GFR, Estimated 11 (L) >60 mL/min    Comment: (NOTE) Calculated using the CKD-EPI Creatinine Equation (2021)    Anion gap 7 5 - 15    Comment: Performed at [facility], [address] [place], [place] [address]  Magnesium     Status: None   Collection Time: 03/03/22  5:15 AM  Result Value Ref Range   Magnesium 2.1 1.7 - 2.4 mg/dL    Comment: Performed at [facility], [address] [place], [place] [address]  Glucose, capillary     Status: Abnormal   Collection Time: 03/03/22  7:34 AM  Result Value Ref Range   Glucose-Capillary 206 (H) 70 - 99 mg/dL    Comment: Glucose reference range applies only to samples taken after fasting for at least 8 hours.   Comment 1 Notify RN    Comment 2 Document in Chart   Glucose, capillary     Status: Abnormal   Collection Time: 03/03/22 12:00 PM  Result Value Ref Range   Glucose-Capillary 310 (H) 70 - 99 mg/dL    Comment: Glucose reference range applies only to samples taken after fasting for at least 8 hours.  Glucose, capillary     Status: Abnormal   Collection Time: 03/03/22  4:51 PM  Result Value Ref Range   Glucose-Capillary 146 (H) 70 - 99 mg/dL    Comment: Glucose reference range applies only to samples taken after fasting for at least 8 hours.   Glucose, capillary     Status: Abnormal   Collection Time: 03/03/22  9:10 PM  Result Value Ref Range   Glucose-Capillary 157 (H) 70 - 99 mg/dL    Comment: Glucose reference range applies only to samples taken after fasting for at least 8 hours.  Basic metabolic panel     Status: Abnormal   Collection Time: 03/04/22  4:00 AM  Result Value Ref Range   Sodium 143 135 - 145 mmol/L   Potassium 3.6 3.5 - 5.1 mmol/L   Chloride 115 (H) 98 - 111 mmol/L   CO2 20 (L) 22 - 32 mmol/L   Glucose, Bld 131 (H) 70 - 99 mg/dL    Comment: Glucose reference range applies only to samples taken after fasting for at least 8 hours.   BUN 85 (H) 8 - 23 mg/dL   Creatinine, Ser 4.81 (H) 0.61 - 1.24 mg/dL   Calcium 8.2 (L) 8.9 - 10.3 mg/dL   GFR, Estimated 11 (L) >60 mL/min    Comment: (NOTE) Calculated using the CKD-EPI Creatinine Equation (2021)    Anion gap 8 5 - 15    Comment: Performed at [facility], [address], [place], [place] [address]  CBC     Status: Abnormal   Collection Time: 03/04/22  4:00 AM  Result Value Ref Range   WBC 9.0 4.0 - 10.5 K/uL   RBC 3.65 (L) 4.22 - 5.81 MIL/uL   Hemoglobin 10.3 (L) 13.0 - 17.0 g/dL   HCT 32.8 (L) 39.0 - 52.0 %   MCV 89.9 80.0 - 100.0 fL   MCH 28.2 26.0 - 34.0 pg   MCHC 31.4 30.0 - 36.0 g/dL   RDW 14.0 11.5 - 15.5 %   Platelets 316 150 - 400 K/uL   nRBC 0.0 0.0 - 0.2 %    Comment: Performed at [facility], [address] [place], [place] [address]  Glucose, capillary     Status: Abnormal   Collection Time: 03/04/22  8:02 AM  Result Value Ref Range   Glucose-Capillary 215 (H) 70 - 99 mg/dL    Comment: Glucose reference range applies only to samples taken after fasting for at least 8 hours.  Glucose, capillary     Status: Abnormal   Collection Time: 03/04/22 12:28 PM  Result Value Ref Range   Glucose-Capillary 254 (H) 70 - 99 mg/dL    Comment: Glucose reference range applies only to samples taken after fasting for at least 8  hours.  Gastrointestinal Panel by PCR , Stool     Status: None   Collection Time: 03/04/22  1:18 PM   Specimen: Stool  Result Value Ref Range   Campylobacter species NOT DETECTED NOT DETECTED   Plesimonas shigelloides NOT DETECTED NOT DETECTED   Salmonella species NOT DETECTED NOT DETECTED   Yersinia enterocolitica NOT DETECTED NOT DETECTED   Vibrio species NOT DETECTED NOT DETECTED   Vibrio cholerae NOT DETECTED NOT DETECTED   Enteroaggregative E coli (EAEC) NOT DETECTED NOT DETECTED   Enteropathogenic E coli (EPEC) NOT DETECTED NOT DETECTED   Enterotoxigenic E coli (ETEC) NOT DETECTED NOT DETECTED   Shiga like toxin producing E coli (STEC) NOT DETECTED NOT DETECTED   Shigella/Enteroinvasive E coli (EIEC) NOT DETECTED NOT DETECTED   Cryptosporidium NOT DETECTED NOT DETECTED   Cyclospora cayetanensis NOT DETECTED NOT DETECTED   Entamoeba histolytica NOT DETECTED NOT DETECTED   Giardia lamblia NOT DETECTED NOT DETECTED   Adenovirus F40/41 NOT DETECTED NOT DETECTED   Astrovirus NOT DETECTED NOT DETECTED   Norovirus GI/GII NOT DETECTED NOT DETECTED   Rotavirus A NOT DETECTED NOT DETECTED   Sapovirus (I, II, IV, and V) NOT DETECTED NOT DETECTED    Comment: Performed at [facility], [address] [place], [place] [address]  Hepatitis B surface antigen     Status: None   Collection Time: 03/04/22  3:58 PM  Result Value Ref Range   Hepatitis B Surface Ag NON REACTIVE NON REACTIVE    Comment: Performed at [facility], [place] [address], [place], [place] [address]  Hepatitis B surface antibody     Status: None   Collection Time: 03/04/22  3:58 PM  Result Value Ref Range   Hep B S Ab NON REACTIVE NON REACTIVE    Comment: (NOTE) Inconsistent with immunity, less than 10 mIU/mL.  Performed at [facility], [place] [address], [place], [place] [address]   Hepatitis B surface antibody,quantitative     Status: Abnormal   Collection Time: 03/04/22  3:58 PM  Result Value  Ref Range   Hep B S AB Quant (Post) 4.7 (L) Immunity>9.9 mIU/mL    Comment: (NOTE)  Status of Immunity  Anti-HBs Level  ------------------                     -------------- Inconsistent with Immunity                   0.0 - 9.9 Consistent with Immunity                          >9.9 Performed At: [facility] [place], [place] [id number] Rush Farmer MD VO:[id number]   Hepatitis B core antibody, total     Status: None   Collection Time: 03/04/22  3:58 PM  Result Value Ref Range   Hep B Core Total Ab NON REACTIVE NON REACTIVE    Comment: Performed at [facility], [place] [address] [place], [place] [address]  Hepatitis C antibody     Status: None   Collection Time: 03/04/22  3:58 PM  Result Value Ref Range   HCV Ab NON REACTIVE NON REACTIVE    Comment: (NOTE) Nonreactive HCV antibody screen is consistent with no HCV infections,  unless recent infection is suspected or other evidence exists to indicate HCV infection.  Performed at [facility], [place] [address], [place], [place] [address]   Glucose, capillary     Status: Abnormal   Collection Time: 03/04/22  4:20 PM  Result Value Ref Range   Glucose-Capillary 240 (H) 70 - 99 mg/dL    Comment: Glucose reference range applies only to samples taken after fasting for at least 8 hours.  Glucose, capillary     Status: Abnormal   Collection Time: 03/04/22  9:30 PM  Result Value Ref Range   Glucose-Capillary 102 (H) 70 - 99 mg/dL    Comment: Glucose reference range applies only to samples taken after fasting for at least 8 hours.  Glucose, capillary     Status: Abnormal   Collection Time: 03/05/22  7:54 AM  Result Value Ref Range   Glucose-Capillary 175 (H) 70 - 99 mg/dL    Comment: Glucose reference range applies only to samples taken after fasting for at least 8 hours.  Basic metabolic panel     Status: Abnormal   Collection Time: 03/05/22  8:12 AM  Result Value Ref Range    Sodium 142 135 - 145 mmol/L   Potassium 3.9 3.5 - 5.1 mmol/L   Chloride 114 (H) 98 - 111 mmol/L   CO2 18 (L) 22 - 32 mmol/L   Glucose, Bld 179 (H) 70 - 99 mg/dL    Comment: Glucose reference range applies only to samples taken after fasting for at least 8 hours.   BUN 78 (H) 8 - 23 mg/dL   Creatinine, Ser 4.53 (H) 0.61 - 1.24 mg/dL   Calcium 8.3 (L) 8.9 - 10.3 mg/dL   GFR, Estimated 12 (L) >60 mL/min    Comment: (NOTE) Calculated using the CKD-EPI Creatinine Equation (2021)    Anion gap 10 5 - 15    Comment: Performed at [facility], [address] [place], [place] [address]  Glucose, capillary     Status: Abnormal   Collection Time: 03/05/22 11:11 AM  Result Value Ref Range   Glucose-Capillary 263 (H) 70 - 99 mg/dL    Comment: Glucose reference range applies only to samples taken after fasting for at least 8 hours.  Glucose, capillary     Status: Abnormal   Collection Time: 03/05/22  6:00 PM  Result  Value Ref Range   Glucose-Capillary 171 (H) 70 - 99 mg/dL    Comment: Glucose reference range applies only to samples taken after fasting for at least 8 hours.  Glucose, capillary     Status: Abnormal   Collection Time: 03/05/22  8:49 PM  Result Value Ref Range   Glucose-Capillary 289 (H) 70 - 99 mg/dL    Comment: Glucose reference range applies only to samples taken after fasting for at least 8 hours.  Basic metabolic panel     Status: Abnormal   Collection Time: 03/06/22  4:20 AM  Result Value Ref Range   Sodium 141 135 - 145 mmol/L   Potassium 3.9 3.5 - 5.1 mmol/L   Chloride 110 98 - 111 mmol/L   CO2 24 22 - 32 mmol/L   Glucose, Bld 217 (H) 70 - 99 mg/dL    Comment: Glucose reference range applies only to samples taken after fasting for at least 8 hours.   BUN 57 (H) 8 - 23 mg/dL   Creatinine, Ser 3.61 (H) 0.61 - 1.24 mg/dL   Calcium 8.0 (L) 8.9 - 10.3 mg/dL   GFR, Estimated 16 (L) >60 mL/min    Comment: (NOTE) Calculated using the CKD-EPI Creatinine Equation  (2021)    Anion gap 7 5 - 15    Comment: Performed at [facility], [address] [place], [place] [address]  Glucose, capillary     Status: Abnormal   Collection Time: 03/06/22  7:22 AM  Result Value Ref Range   Glucose-Capillary 271 (H) 70 - 99 mg/dL    Comment: Glucose reference range applies only to samples taken after fasting for at least 8 hours.  Glucose, capillary     Status: Abnormal   Collection Time: 03/06/22 12:12 PM  Result Value Ref Range   Glucose-Capillary 155 (H) 70 - 99 mg/dL    Comment: Glucose reference range applies only to samples taken after fasting for at least 8 hours.  Glucose, capillary     Status: Abnormal   Collection Time: 03/06/22  4:12 PM  Result Value Ref Range   Glucose-Capillary 174 (H) 70 - 99 mg/dL    Comment: Glucose reference range applies only to samples taken after fasting for at least 8 hours.  Glucose, capillary     Status: Abnormal   Collection Time: 03/06/22  9:38 PM  Result Value Ref Range   Glucose-Capillary 126 (H) 70 - 99 mg/dL    Comment: Glucose reference range applies only to samples taken after fasting for at least 8 hours.  CBC     Status: Abnormal   Collection Time: 03/07/22  4:04 AM  Result Value Ref Range   WBC 10.0 4.0 - 10.5 K/uL   RBC 3.67 (L) 4.22 - 5.81 MIL/uL   Hemoglobin 10.4 (L) 13.0 - 17.0 g/dL   HCT 33.3 (L) 39.0 - 52.0 %   MCV 90.7 80.0 - 100.0 fL   MCH 28.3 26.0 - 34.0 pg   MCHC 31.2 30.0 - 36.0 g/dL   RDW 13.8 11.5 - 15.5 %   Platelets 279 150 - 400 K/uL   nRBC 0.0 0.0 - 0.2 %    Comment: Performed at [facility], [address] [place], [place] [address]  Renal function panel     Status: Abnormal   Collection Time: 03/07/22  4:04 AM  Result Value Ref Range   Sodium 142 135 - 145 mmol/L   Potassium 3.5 3.5 - 5.1 mmol/L   Chloride  106 98 - 111 mmol/L   CO2 27 22 - 32 mmol/L   Glucose, Bld 119 (H) 70 - 99 mg/dL    Comment: Glucose reference range applies only to samples taken  after fasting for at least 8 hours.   BUN 45 (H) 8 - 23 mg/dL   Creatinine, Ser 3.16 (H) 0.61 - 1.24 mg/dL   Calcium 8.0 (L) 8.9 - 10.3 mg/dL   Phosphorus 4.9 (H) 2.5 - 4.6 mg/dL   Albumin 2.7 (L) 3.5 - 5.0 g/dL   GFR, Estimated 19 (L) >60 mL/min    Comment: (NOTE) Calculated using the CKD-EPI Creatinine Equation (2021)    Anion gap 9 5 - 15    Comment: Performed at [facility], [address] [place], [place] [address]  Glucose, capillary     Status: Abnormal   Collection Time: 03/07/22  6:26 AM  Result Value Ref Range   Glucose-Capillary 154 (H) 70 - 99 mg/dL    Comment: Glucose reference range applies only to samples taken after fasting for at least 8 hours.   Comment 1 Notify RN   Glucose, capillary     Status: Abnormal   Collection Time: 03/07/22 11:51 AM  Result Value Ref Range   Glucose-Capillary 103 (H) 70 - 99 mg/dL    Comment: Glucose reference range applies only to samples taken after fasting for at least 8 hours.  Glucose, capillary     Status: Abnormal   Collection Time: 03/07/22  4:28 PM  Result Value Ref Range   Glucose-Capillary 124 (H) 70 - 99 mg/dL    Comment: Glucose reference range applies only to samples taken after fasting for at least 8 hours.  Glucose, capillary     Status: Abnormal   Collection Time: 03/07/22  7:18 PM  Result Value Ref Range   Glucose-Capillary 148 (H) 70 - 99 mg/dL    Comment: Glucose reference range applies only to samples taken after fasting for at least 8 hours.  CBC     Status: Abnormal   Collection Time: 03/08/22  4:30 AM  Result Value Ref Range   WBC 9.7 4.0 - 10.5 K/uL   RBC 3.57 (L) 4.22 - 5.81 MIL/uL   Hemoglobin 10.0 (L) 13.0 - 17.0 g/dL   HCT 32.6 (L) 39.0 - 52.0 %   MCV 91.3 80.0 - 100.0 fL   MCH 28.0 26.0 - 34.0 pg   MCHC 30.7 30.0 - 36.0 g/dL   RDW 13.8 11.5 - 15.5 %   Platelets 252 150 - 400 K/uL   nRBC 0.0 0.0 - 0.2 %    Comment: Performed at [facility], [address], [place],  [place] [address]  Basic metabolic panel     Status: Abnormal   Collection Time: 03/08/22  4:30 AM  Result Value Ref Range   Sodium 139 135 - 145 mmol/L   Potassium 3.8 3.5 - 5.1 mmol/L   Chloride 102 98 - 111 mmol/L   CO2 29 22 - 32 mmol/L   Glucose, Bld 141 (H) 70 - 99 mg/dL    Comment: Glucose reference range applies only to samples taken after fasting for at least 8 hours.   BUN 33 (H) 8 - 23 mg/dL   Creatinine, Ser 3.14 (H) 0.61 - 1.24 mg/dL   Calcium 7.8 (L) 8.9 - 10.3 mg/dL   GFR, Estimated 19 (L) >60 mL/min    Comment: (NOTE) Calculated using the CKD-EPI Creatinine Equation (2021)    Anion gap 8 5 -  15    Comment: Performed at [facility], [address] [place], [place] [address]  Glucose, capillary     Status: Abnormal   Collection Time: 03/08/22  7:52 AM  Result Value Ref Range   Glucose-Capillary 215 (H) 70 - 99 mg/dL    Comment: Glucose reference range applies only to samples taken after fasting for at least 8 hours.  Glucose, capillary     Status: Abnormal   Collection Time: 03/08/22 11:10 AM  Result Value Ref Range   Glucose-Capillary 294 (H) 70 - 99 mg/dL    Comment: Glucose reference range applies only to samples taken after fasting for at least 8 hours.  Glucose, capillary     Status: None   Collection Time: 03/08/22  3:28 PM  Result Value Ref Range   Glucose-Capillary 74 70 - 99 mg/dL    Comment: Glucose reference range applies only to samples taken after fasting for at least 8 hours.  Glucose, capillary     Status: Abnormal   Collection Time: 03/08/22  4:28 PM  Result Value Ref Range   Glucose-Capillary 110 (H) 70 - 99 mg/dL    Comment: Glucose reference range applies only to samples taken after fasting for at least 8 hours.  Glucose, capillary     Status: Abnormal   Collection Time: 03/08/22  7:57 PM  Result Value Ref Range   Glucose-Capillary 191 (H) 70 - 99 mg/dL    Comment: Glucose reference range applies only to samples taken after fasting for at  least 8 hours.  Glucose, capillary     Status: Abnormal   Collection Time: 03/08/22  9:26 PM  Result Value Ref Range   Glucose-Capillary 135 (H) 70 - 99 mg/dL    Comment: Glucose reference range applies only to samples taken after fasting for at least 8 hours.  CBC     Status: Abnormal   Collection Time: 03/09/22  4:17 AM  Result Value Ref Range   WBC 9.2 4.0 - 10.5 K/uL   RBC 3.53 (L) 4.22 - 5.81 MIL/uL   Hemoglobin 10.1 (L) 13.0 - 17.0 g/dL   HCT 32.3 (L) 39.0 - 52.0 %   MCV 91.5 80.0 - 100.0 fL   MCH 28.6 26.0 - 34.0 pg   MCHC 31.3 30.0 - 36.0 g/dL   RDW 13.9 11.5 - 15.5 %   Platelets 247 150 - 400 K/uL   nRBC 0.0 0.0 - 0.2 %    Comment: Performed at [facility], [address], [place], [place] [address]  Basic metabolic panel     Status: Abnormal   Collection Time: 03/09/22  4:17 AM  Result Value Ref Range   Sodium 136 135 - 145 mmol/L   Potassium 3.5 3.5 - 5.1 mmol/L   Chloride 101 98 - 111 mmol/L   CO2 25 22 - 32 mmol/L   Glucose, Bld 150 (H) 70 - 99 mg/dL    Comment: Glucose reference range applies only to samples taken after fasting for at least 8 hours.   BUN 41 (H) 8 - 23 mg/dL   Creatinine, Ser 3.69 (H) 0.61 - 1.24 mg/dL   Calcium 7.9 (L) 8.9 - 10.3 mg/dL   GFR, Estimated 16 (L) >60 mL/min    Comment: (NOTE) Calculated using the CKD-EPI Creatinine Equation (2021)    Anion gap 10 5 - 15    Comment: Performed at [facility], [address] [place], [place] [address]  Glucose, capillary  Status: Abnormal   Collection Time: 03/09/22  7:56 AM  Result Value Ref Range   Glucose-Capillary 218 (H) 70 - 99 mg/dL    Comment: Glucose reference range applies only to samples taken after fasting for at least 8 hours.   Comment 1 Notify RN    Comment 2 Document in Chart   Glucose, capillary     Status: Abnormal   Collection Time: 03/09/22 11:54 AM  Result Value Ref Range   Glucose-Capillary 287 (H) 70 - 99 mg/dL    Comment: Glucose reference  range applies only to samples taken after fasting for at least 8 hours.  Glucose, capillary     Status: Abnormal   Collection Time: 03/09/22  3:53 PM  Result Value Ref Range   Glucose-Capillary 128 (H) 70 - 99 mg/dL    Comment: Glucose reference range applies only to samples taken after fasting for at least 8 hours.  Glucose, capillary     Status: None   Collection Time: 03/09/22  4:53 PM  Result Value Ref Range   Glucose-Capillary 92 70 - 99 mg/dL    Comment: Glucose reference range applies only to samples taken after fasting for at least 8 hours.  Glucose, capillary     Status: Abnormal   Collection Time: 03/09/22  8:56 PM  Result Value Ref Range   Glucose-Capillary 122 (H) 70 - 99 mg/dL    Comment: Glucose reference range applies only to samples taken after fasting for at least 8 hours.  Basic metabolic panel     Status: Abnormal   Collection Time: 03/10/22  3:24 AM  Result Value Ref Range   Sodium 139 135 - 145 mmol/L   Potassium 4.0 3.5 - 5.1 mmol/L   Chloride 105 98 - 111 mmol/L   CO2 27 22 - 32 mmol/L   Glucose, Bld 154 (H) 70 - 99 mg/dL    Comment: Glucose reference range applies only to samples taken after fasting for at least 8 hours.   BUN 46 (H) 8 - 23 mg/dL   Creatinine, Ser 3.60 (H) 0.61 - 1.24 mg/dL   Calcium 8.0 (L) 8.9 - 10.3 mg/dL   GFR, Estimated 16 (L) >60 mL/min    Comment: (NOTE) Calculated using the CKD-EPI Creatinine Equation (2021)    Anion gap 7 5 - 15    Comment: Performed at [facility], [address] [place], [place] [address]  Magnesium     Status: None   Collection Time: 03/10/22  3:24 AM  Result Value Ref Range   Magnesium 1.8 1.7 - 2.4 mg/dL    Comment: Performed at [facility], [address] [place], [place] [address]  Glucose, capillary     Status: Abnormal   Collection Time: 03/10/22  7:48 AM  Result Value Ref Range   Glucose-Capillary 190 (H) 70 - 99 mg/dL    Comment: Glucose reference range applies only to  samples taken after fasting for at least 8 hours.  Glucose, capillary     Status: Abnormal   Collection Time: 03/10/22 11:31 AM  Result Value Ref Range   Glucose-Capillary 323 (H) 70 - 99 mg/dL    Comment: Glucose reference range applies only to samples taken after fasting for at least 8 hours.  Glucose, capillary     Status: Abnormal   Collection Time: 03/10/22  5:01 PM  Result Value Ref Range   Glucose-Capillary 203 (H) 70 - 99 mg/dL    Comment: Glucose reference range applies only to  samples taken after fasting for at least 8 hours.  Glucose, capillary     Status: Abnormal   Collection Time: 03/10/22  8:39 PM  Result Value Ref Range   Glucose-Capillary 174 (H) 70 - 99 mg/dL    Comment: Glucose reference range applies only to samples taken after fasting for at least 8 hours.  Renal function panel     Status: Abnormal   Collection Time: 03/11/22  3:35 AM  Result Value Ref Range   Sodium 138 135 - 145 mmol/L   Potassium 3.7 3.5 - 5.1 mmol/L   Chloride 105 98 - 111 mmol/L   CO2 23 22 - 32 mmol/L   Glucose, Bld 157 (H) 70 - 99 mg/dL    Comment: Glucose reference range applies only to samples taken after fasting for at least 8 hours.   BUN 53 (H) 8 - 23 mg/dL   Creatinine, Ser 3.57 (H) 0.61 - 1.24 mg/dL   Calcium 8.2 (L) 8.9 - 10.3 mg/dL   Phosphorus 5.2 (H) 2.5 - 4.6 mg/dL   Albumin 2.8 (L) 3.5 - 5.0 g/dL   GFR, Estimated 16 (L) >60 mL/min    Comment: (NOTE) Calculated using the CKD-EPI Creatinine Equation (2021)    Anion gap 10 5 - 15    Comment: Performed at [facility], [address] [place], [place] [address]  Glucose, capillary     Status: Abnormal   Collection Time: 03/11/22  8:26 AM  Result Value Ref Range   Glucose-Capillary 223 (H) 70 - 99 mg/dL    Comment: Glucose reference range applies only to samples taken after fasting for at least 8 hours.  Glucose, capillary     Status: Abnormal   Collection Time: 03/11/22 12:15 PM  Result Value Ref Range    Glucose-Capillary 370 (H) 70 - 99 mg/dL    Comment: Glucose reference range applies only to samples taken after fasting for at least 8 hours.  Urinalysis, Complete     Status: Abnormal   Collection Time: 03/20/22  2:40 PM  Result Value Ref Range   Specific Gravity, UA 1.025 1.005 - 1.030   pH, UA 6.0 5.0 - 7.5   Color, UA Yellow Yellow   Appearance Ur Clear Clear   Leukocytes,UA Trace (A) Negative   Protein,UA 3+ (A) Negative/Trace   Glucose, UA 2+ (A) Negative   Ketones, UA Negative Negative   RBC, UA 3+ (A) Negative   Bilirubin, UA Negative Negative   Urobilinogen, Ur 0.2 0.2 - 1.0 mg/dL   Nitrite, UA Negative Negative   Microscopic Examination See below:   Microscopic Examination     Status: Abnormal   Collection Time: 03/20/22  2:40 PM   Urine  Result Value Ref Range   WBC, UA 6-10 (A) 0 - 5 /hpf   RBC, Urine >30 (A) 0 - 2 /hpf   Epithelial Cells (non renal) 0-10 0 - 10 /hpf   Casts Present (A) None seen /lpf   Cast Type Granular casts (A) N/A   Bacteria, UA Few None seen/Few  [facility] Serum Panel     Status: Abnormal   Collection Time: 04/10/22 12:00 AM  Result Value Ref Range   Uric Acid 5.4 3.8 - 8.4 mg/dL    Comment:            Therapeutic target for gout patients: <6.0   Creatinine, Ser 2.99 (H) 0.76 - 1.27 mg/dL   eGFR 20 (L) >59 mL/min/1.73   Sodium 141 134 - 144  mmol/L   Potassium 4.7 3.5 - 5.2 mmol/L   Chloride 104 96 - 106 mmol/L   CO2 24 20 - 29 mmol/L   Calcium 9.1 8.6 - 10.2 mg/dL   Phosphorus 4.3 (H) 2.8 - 4.1 mg/dL   Magnesium 1.8 1.6 - 2.3 mg/dL  [facility] 24Hr Urine Panel     Status: Abnormal   Collection Time: 04/10/22  7:45 AM  Result Value Ref Range   Cystine, Urine, Qualitative Neg Negative   Urine Volume (Preserved) 2,240 500 - 4,000 mL/24 hr   Calcium Oxalate Saturation 1.10 (L) 6.00 - 10.00   Calcium, Urine 44 <250 mg/24 hr   Oxalate, Urine 25 20 - 40 mg/24 hr   Citrate, Urine 171 (L) >450 mg/24 hr   Calcium Phosphate Saturation 0.11 (L)  0.50 - 2.00   pH, 24 hr, Urine 5.818 5.800 - 6.200   Uric Acid Saturation 0.64 <1.00   Uric Acid, Urine 505 <800 mg/24 hr   Sodium, Urine 157 (H) 50 - 150 mmol/24 hr   Potassium, Urine 53 20 - 100 mmol/24 hr   Magnesium, Urine 83 30 - 120 mg/24 hr   Phosphorus, Urine 864 600 - 1,200 mg/24 hr   Ammonium, Urine 25 15 - 60 mmol/24 hr   Chloride, Urine 153 70 - 250 mmol/24 hr   Sulfate, Urine 29 20 - 80 meq/24 hr   Urea Nitrogen, Urine 9.92 6.00 - 14.00 g/24 hr   Protein Catabolic Rate 0.6 (L) 0.8 - 1.4 g/kg/24 hr   Creatinine, Urine 3,382 Not Applic. NK/53 hr   Creatinine/Kg Body Weight 8.1 (L) 11.9 - 24.4 mg/24 hr/kg   Calcium/Kg Body Weight 0.3 <4.0 mg/24 hr/kg   Calcium/Creatinine Ratio 37 34 - 196 mg/g creat   Comment Note     Radiology No results found.  Assessment/Plan  Swelling of limb The patient swelling is under much better control with the daily use of compression socks and elevation.  His swelling is very mild at this point.  Continue conservative measures.  Recheck in 1 year.  Hyperlipemia lipid control important in reducing the progression of atherosclerotic disease. Continue statin therapy   Chronic kidney disease, stage III (moderate) Can worsen lower extremity swelling  Type 2 diabetes mellitus with renal manifestations (HCC) blood glucose control important in reducing the progression of atherosclerotic disease. Also, involved in wound healing. On appropriate medications.   Hypertension blood pressure control important in reducing the progression of atherosclerotic disease. On appropriate oral medications.    Leotis Pain, MD  04/30/2022 1:42 PM    This note was created with Dragon medical transcription system.  Any errors from dictation are purely unintentional

## 2022-04-30 NOTE — Assessment & Plan Note (Signed)
Can worsen lower extremity swelling

## 2022-04-30 NOTE — Assessment & Plan Note (Signed)
lipid control important in reducing the progression of atherosclerotic disease. Continue statin therapy  

## 2022-04-30 NOTE — Assessment & Plan Note (Signed)
blood pressure control important in reducing the progression of atherosclerotic disease. On appropriate oral medications.  

## 2022-05-06 ENCOUNTER — Other Ambulatory Visit: Payer: Self-pay | Admitting: *Deleted

## 2022-05-06 MED ORDER — TAMSULOSIN HCL 0.4 MG PO CAPS: 0.4000 mg | ORAL_CAPSULE | Freq: Every day | ORAL | 1 refills | Status: DC

## 2022-05-08 DIAGNOSIS — J309 Allergic rhinitis, unspecified: Secondary | ICD-10-CM | POA: Diagnosis not present

## 2022-05-08 DIAGNOSIS — I700 Atherosclerosis of aorta: Secondary | ICD-10-CM | POA: Diagnosis not present

## 2022-05-08 DIAGNOSIS — E1140 Type 2 diabetes mellitus with diabetic neuropathy, unspecified: Secondary | ICD-10-CM | POA: Diagnosis not present

## 2022-05-08 DIAGNOSIS — I509 Heart failure, unspecified: Secondary | ICD-10-CM | POA: Diagnosis not present

## 2022-05-08 DIAGNOSIS — I10 Essential (primary) hypertension: Secondary | ICD-10-CM | POA: Diagnosis not present

## 2022-05-08 DIAGNOSIS — R6889 Other general symptoms and signs: Secondary | ICD-10-CM | POA: Diagnosis not present

## 2022-05-08 DIAGNOSIS — G4733 Obstructive sleep apnea (adult) (pediatric): Secondary | ICD-10-CM | POA: Diagnosis not present

## 2022-05-08 DIAGNOSIS — N2581 Secondary hyperparathyroidism of renal origin: Secondary | ICD-10-CM | POA: Diagnosis not present

## 2022-05-08 DIAGNOSIS — E7800 Pure hypercholesterolemia, unspecified: Secondary | ICD-10-CM | POA: Diagnosis not present

## 2022-05-08 DIAGNOSIS — M25511 Pain in right shoulder: Secondary | ICD-10-CM | POA: Diagnosis not present

## 2022-05-08 DIAGNOSIS — E1122 Type 2 diabetes mellitus with diabetic chronic kidney disease: Secondary | ICD-10-CM | POA: Diagnosis not present

## 2022-05-09 DIAGNOSIS — E7800 Pure hypercholesterolemia, unspecified: Secondary | ICD-10-CM | POA: Diagnosis not present

## 2022-05-11 IMAGING — CR DG ABDOMEN 1V
2 series · 2 of 2 positions shown · non-contrast
Comparison: CT of the abdomen 07/26/2019

CLINICAL DATA: Follow-up kidney stones.

EXAM:
ABDOMEN - 1 VIEW

[abdomen kub (1 of 2)]
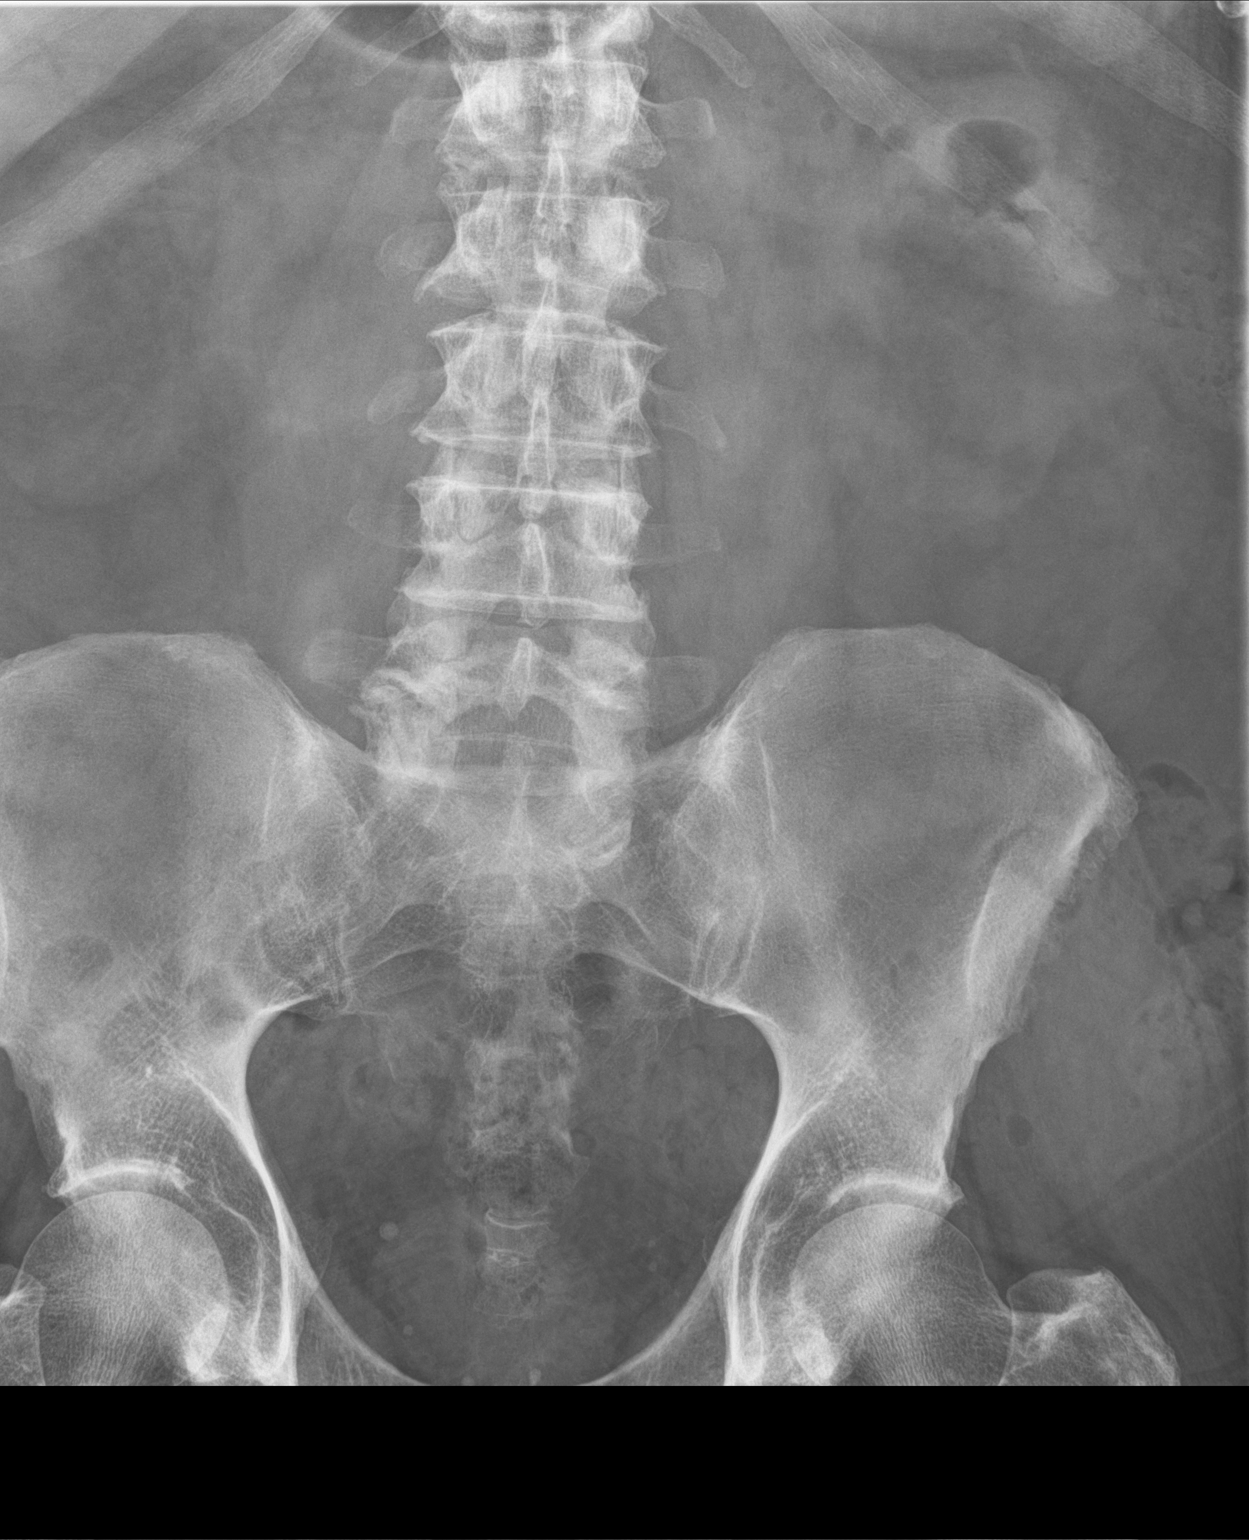

[abdomen kub (2 of 2)]
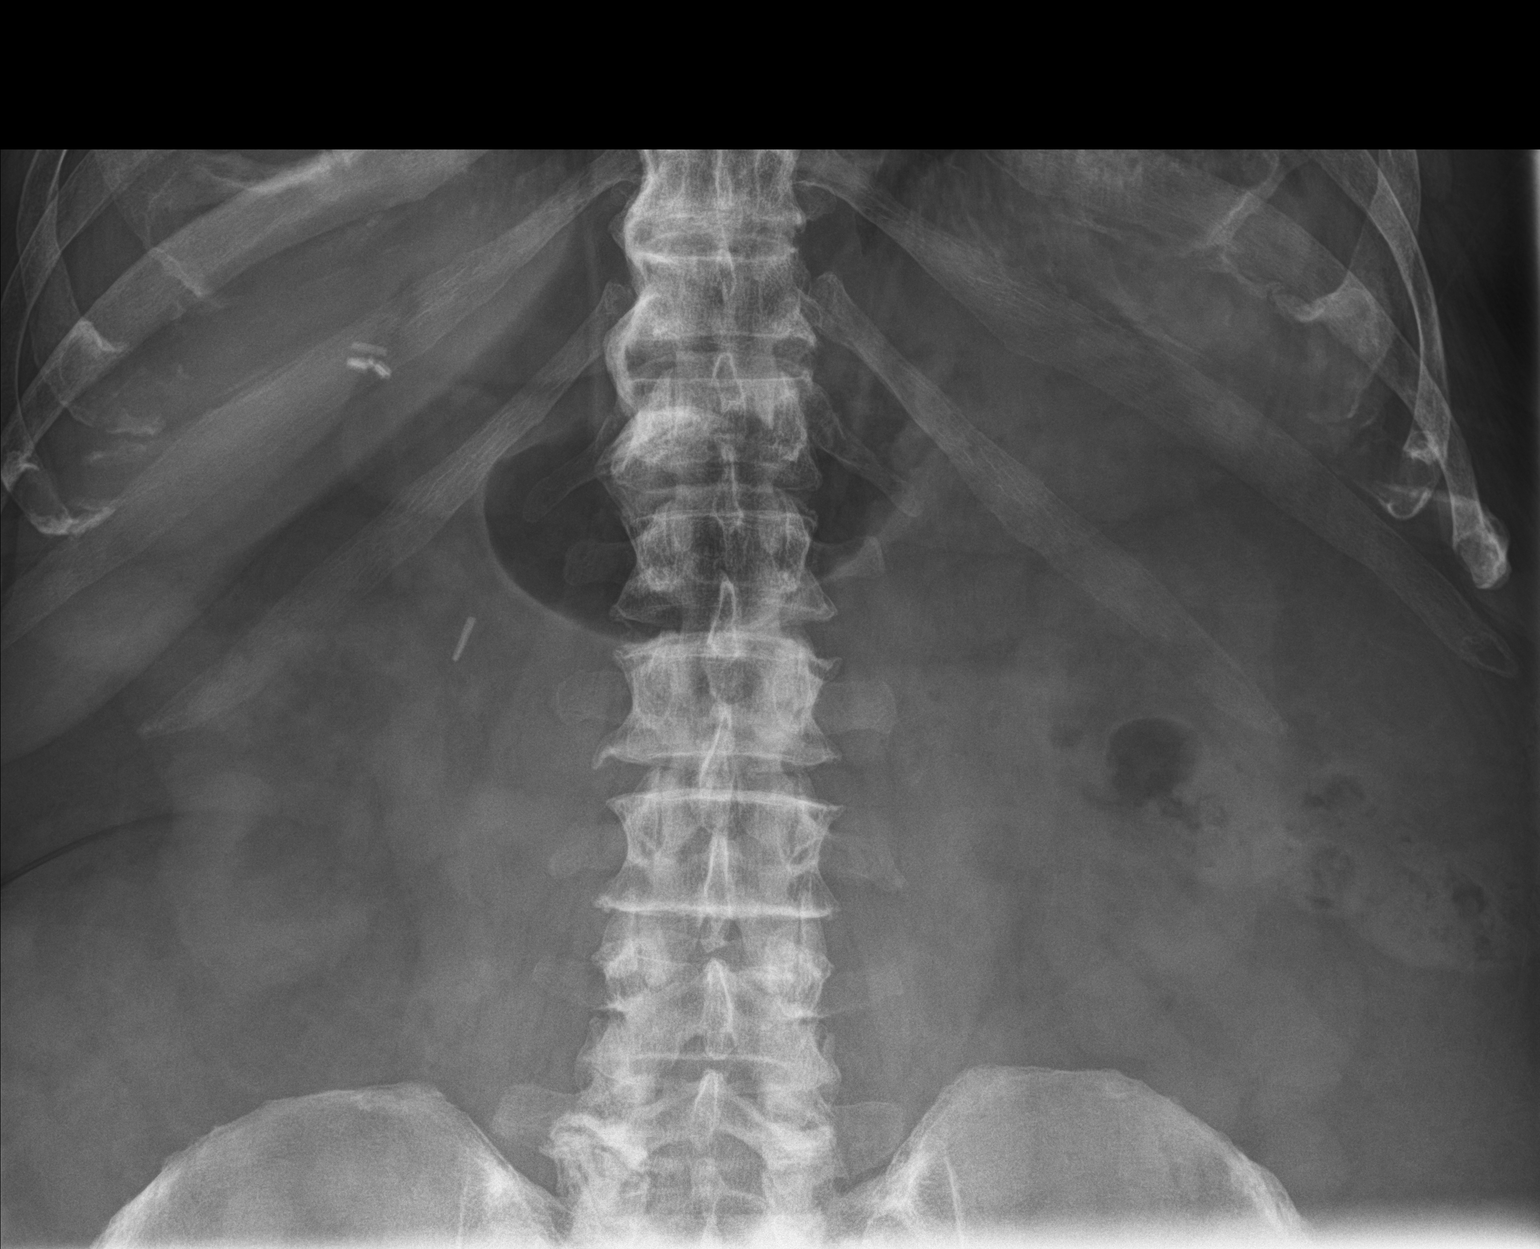

[2 of 2 positions shown; findings below may reference images not displayed]

FINDINGS: Bowel gas pattern is within normal limits. Faint opacification is
again noted at the right renal collecting system compatible with the
known nonobstructing stone. Additional stone in the right kidney is
not visualized. Pelvic phleboliths are stable. Degenerative changes
of the lumbar spine are stable. Lung bases are clear.
IMPRESSION: 1. Stable right renal stone.
2. No acute abnormality.

## 2022-05-13 DIAGNOSIS — N184 Chronic kidney disease, stage 4 (severe): Secondary | ICD-10-CM | POA: Diagnosis not present

## 2022-05-13 DIAGNOSIS — E1122 Type 2 diabetes mellitus with diabetic chronic kidney disease: Secondary | ICD-10-CM | POA: Diagnosis not present

## 2022-05-20 ENCOUNTER — Encounter (INDEPENDENT_AMBULATORY_CARE_PROVIDER_SITE_OTHER): Payer: Self-pay

## 2022-05-21 ENCOUNTER — Ambulatory Visit (LOCAL_COMMUNITY_HEALTH_CENTER): Payer: PPO

## 2022-05-21 DIAGNOSIS — Z23 Encounter for immunization: Secondary | ICD-10-CM | POA: Diagnosis not present

## 2022-05-21 DIAGNOSIS — Z719 Counseling, unspecified: Secondary | ICD-10-CM

## 2022-05-21 NOTE — Progress Notes (Signed)
  Are you feeling sick today? No   Have you ever received a dose of COVID-19 Vaccine? [employer], [place], [place], [place], Other) Yes  If yes, which vaccine and how many doses?    Pfizer 5 doses  Did you bring the vaccination record card or other documentation?  Yes   Do you have a health condition or are undergoing treatment that makes you moderately or severely immunocompromised? This would include, but not be limited to: cancer, HIV, organ transplant, immunosuppressive therapy/high-dose corticosteroids, or moderate/severe primary immunodeficiency.  No  Have you received COVID-19 vaccine before or during hematopoietic cell transplant (HCT) or CAR-T-cell therapies? No  Have you ever had an allergic reaction to: (This would include a severe allergic reaction or a reaction that caused hives, swelling, or respiratory distress, including wheezing.) A component of a COVID-19 vaccine or a previous dose of COVID-19 vaccine? No   Have you ever had an allergic reaction to another vaccine (other thanCOVID-19 vaccine) or an injectable medication? (This would include a severe allergic reaction or a reaction that caused hives, swelling, or respiratory distress, including wheezing.)   No    Do you have a history of any of the following:  Myocarditis or Pericarditis No  Dermal fillers:  No  Multisystem Inflammatory Syndrome (MIS-C or MIS-A)? No  COVID-19 disease within the past 3 months? No  Vaccinated with monkeypox vaccine in the last 4 weeks? No  Comirnaty 2023-24 vaccine administered and tolerated well.  Stayed for observation 15 minutes after vaccine given. Tonny Branch, RN

## 2022-05-23 ENCOUNTER — Ambulatory Visit: Payer: PPO | Admitting: Physician Assistant

## 2022-05-23 ENCOUNTER — Ambulatory Visit
Admission: RE | Admit: 2022-05-23 | Discharge: 2022-05-23 | Disposition: A | Discharge status: Home / Self Care | Payer: PPO | Source: Ambulatory Visit | Attending: Physician Assistant | Admitting: Physician Assistant

## 2022-05-23 ENCOUNTER — Encounter: Payer: Self-pay | Admitting: Physician Assistant

## 2022-05-23 VITALS — BP 163/77 | HR 85 | Ht 70.0 in | Wt 324.4 lb

## 2022-05-23 DIAGNOSIS — Z87442 Personal history of urinary calculi: Secondary | ICD-10-CM | POA: Diagnosis not present

## 2022-05-23 DIAGNOSIS — R3129 Other microscopic hematuria: Secondary | ICD-10-CM | POA: Diagnosis not present

## 2022-05-23 DIAGNOSIS — N200 Calculus of kidney: Secondary | ICD-10-CM | POA: Diagnosis not present

## 2022-05-23 DIAGNOSIS — R109 Unspecified abdominal pain: Secondary | ICD-10-CM

## 2022-05-23 DIAGNOSIS — R399 Unspecified symptoms and signs involving the genitourinary system: Secondary | ICD-10-CM | POA: Diagnosis not present

## 2022-05-23 DIAGNOSIS — K5730 Diverticulosis of large intestine without perforation or abscess without bleeding: Secondary | ICD-10-CM | POA: Diagnosis not present

## 2022-05-23 DIAGNOSIS — K429 Umbilical hernia without obstruction or gangrene: Secondary | ICD-10-CM | POA: Diagnosis not present

## 2022-05-23 DIAGNOSIS — N281 Cyst of kidney, acquired: Secondary | ICD-10-CM | POA: Diagnosis not present

## 2022-05-23 LAB — URINALYSIS, COMPLETE
Bilirubin, UA: NEGATIVE
Ketones, UA: NEGATIVE
Leukocytes,UA: NEGATIVE
Nitrite, UA: NEGATIVE
Specific Gravity, UA: 1.02 (ref 1.005–1.030)
Urobilinogen, Ur: 0.2 mg/dL (ref 0.2–1.0)
pH, UA: 7 (ref 5.0–7.5)

## 2022-05-23 LAB — MICROSCOPIC EXAMINATION

## 2022-05-23 NOTE — Progress Notes (Signed)
05/23/2022 2:44 PM   James Clarke [birth year] [id number]  CC: Chief Complaint  Patient presents with   Urinary Frequency   Flank Pain   HPI: James Clarke is a 81 y.o. male with PMH nephrolithiasis with uric acid stones who presents today for evaluation of possible UTI. He is accompanied today by his wife, who contributes to HPI.   Today he reports a 1-week history of bilateral flank pain radiating to the bilateral lower abdominal quadrants. He denies fever, chills, nausea, vomiting, dysuria, and gross hematuria.  In-office UA today positive for 1+ glucose, 1+ blood, and 3+ protein; urine microscopy with 3-10 RBCs/HPF.  PMH: Past Medical History:  Diagnosis Date   Asthma    Bell's palsy    SEVERAL TIMES   Bell's palsy    Cancer ([place])    SKIN   Diabetes mellitus without complication ([place])    Dyspnea    DOE   History of kidney stones    Hyperlipemia    Hypertension    Kidney stone    Neuropathy    Obesity    Sleep apnea    C PAP    Surgical History: Past Surgical History:  Procedure Laterality Date   APPENDECTOMY     CATARACT EXTRACTION W/PHACO Left 05/20/2017   Procedure: CATARACT EXTRACTION PHACO AND INTRAOCULAR LENS PLACEMENT ([place]);  Surgeon: Birder Robson, MD;  Location: [facility];  Service: Ophthalmology;  Laterality: Left;  [place] 00:32.0 AP% 15.5 CDE 4.97 Fluid Pack Lot # [id number]   CATARACT EXTRACTION W/PHACO Right 06/10/2017   Procedure: CATARACT EXTRACTION PHACO AND INTRAOCULAR LENS PLACEMENT ([facility]);  Surgeon: Birder Robson, MD;  Location: [facility];  Service: Ophthalmology;  Laterality: Right;  [place]  00:51 AP% 15.4 CDE 7.95 Fluid pack lot # [id number]   CHOLECYSTECTOMY     CYSTOSCOPY W/ RETROGRADES Right 08/13/2019   Procedure: CYSTOSCOPY WITH RETROGRADE PYELOGRAM;  Surgeon: Billey Co, MD;  Location: [facility];  Service: Urology;  Laterality: Right;   CYSTOSCOPY WITH STENT PLACEMENT Right 07/26/2019   Procedure: CYSTOSCOPY WITH STENT  PLACEMENT;  Surgeon: Lucas Mallow, MD;  Location: [facility];  Service: Urology;  Laterality: Right;   CYSTOSCOPY/URETEROSCOPY/HOLMIUM LASER/STENT PLACEMENT Right 08/13/2019   Procedure: CYSTOSCOPY/URETEROSCOPY/HOLMIUM LASER/STENT EXCHANGE;  Surgeon: Billey Co, MD;  Location: [facility];  Service: Urology;  Laterality: Right;   CYSTOSCOPY/URETEROSCOPY/HOLMIUM LASER/STENT PLACEMENT Left 02/27/2022   Procedure: CYSTOSCOPY/URETEROSCOPY/HOLMIUM LASER/STENT PLACEMENT;  Surgeon: Abbie Sons, MD;  Location: [facility];  Service: Urology;  Laterality: Left;   FRACTURE SURGERY     WRIST   HAND SURGERY     HERNIA REPAIR     LEFT HEART CATH AND CORONARY ANGIOGRAPHY N/A 12/31/2021   Procedure: LEFT HEART CATH AND CORONARY ANGIOGRAPHY;  Surgeon: Corey Skains, MD;  Location: [facility];  Service: Cardiovascular;  Laterality: N/A;   SHOULDER ARTHROSCOPY WITH SUBACROMIAL DECOMPRESSION AND OPEN ROTATOR C Right 09/11/2020   Procedure: Right shoulder arthroscopic rotator cuff repair vs Regeneten patch application, subacromial decompression, and biceps tenodesis - Reche Dixon to Assist;  Surgeon: Leim Fabry, MD;  Location: [facility];  Service: Orthopedics;  Laterality: Right;   TEMPORARY DIALYSIS CATHETER N/A 03/05/2022   Procedure: TEMPORARY DIALYSIS CATHETER;  Surgeon: Katha Cabal, MD;  Location: [facility];  Service: Cardiovascular;  Laterality: N/A;   TONSILLECTOMY     URETEROSCOPY WITH HOLMIUM LASER LITHOTRIPSY Left 02/27/2022   Procedure: URETEROSCOPY WITH HOLMIUM LASER LITHOTRIPSY;  Surgeon: Abbie Sons, MD;  Location: [facility];  Service: Urology;  Laterality: Left;    Home Medications:  Allergies as of 05/23/2022       Reactions   Codeine Nausea And Vomiting   Doxycycline    Erythromycin Rash        Medication List        Accurate as of May 23, 2022  2:44 PM. If you have any questions, ask your nurse or doctor.          STOP taking these  medications    clotrimazole 1 % cream Commonly known as: LOTRIMIN Stopped by: Debroah Loop, PA-C   oxybutynin 5 MG tablet Commonly known as: DITROPAN Stopped by: Debroah Loop, PA-C   potassium citrate 10 MEQ (1080 MG) SR tablet Commonly known as: UROCIT-K Stopped by: Debroah Loop, PA-C       TAKE these medications    acetaminophen 500 MG tablet Commonly known as: TYLENOL Take by mouth.   albuterol 108 (90 Base) MCG/ACT inhaler Commonly known as: VENTOLIN HFA Inhale 2 puffs into the lungs every 6 (six) hours as needed for wheezing or shortness of breath.   amLODipine 10 MG tablet Commonly known as: NORVASC Take 1 tablet (10 mg total) by mouth daily.   aspirin EC 81 MG tablet Take by mouth.   calcitRIOL 0.25 MCG capsule Commonly known as: ROCALTROL Take 0.25 mcg by mouth daily.   cetirizine 10 MG chewable tablet Commonly known as: ZYRTEC Chew by mouth.   fluticasone 50 MCG/ACT nasal spray Commonly known as: FLONASE as needed.   furosemide 40 MG tablet Commonly known as: LASIX Take 1 tablet (40 mg total) by mouth every other day.   Global Inject Ease Insulin Syr 31G X 5/16" 0.5 ML Misc Generic drug: Insulin Syringe-Needle U-100 Inject into the skin.   insulin aspart 100 UNIT/ML injection Commonly known as: novoLOG Inject 30 units before breakfast and lunch. Inject 55 units before supper.   insulin detemir 100 UNIT/ML injection Commonly known as: LEVEMIR Inject 0.3 mLs (30 Units total) into the skin daily. What changed:  how much to take when to take this   lovastatin 40 MG tablet Commonly known as: MEVACOR Take 40 mg by mouth daily with supper.   metoprolol succinate 50 MG 24 hr tablet Commonly known as: TOPROL-XL Take 50 mg by mouth daily.   OneTouch Delica Plus [id number] Misc USE 1 LANCET 4 TIMES DAILY   OneTouch Ultra test strip Generic drug: glucose blood TEST BLOOD SUGAR 4 TIMES DAILY   tamsulosin 0.4 MG Caps  capsule Commonly known as: FLOMAX Take 1 capsule (0.4 mg total) by mouth daily.   traMADol 50 MG tablet Commonly known as: ULTRAM Take by mouth.        Allergies:  Allergies  Allergen Reactions   Codeine Nausea And Vomiting   Doxycycline    Erythromycin Rash    Family History: Family History  Problem Relation Age of Onset   Emphysema Mother    COPD Mother    Heart disease Mother    Brain cancer Father     Social History:   reports that he has never smoked. He has never been exposed to tobacco smoke. He has never used smokeless tobacco. He reports that he does not currently use drugs. He reports that he does not drink alcohol.  Physical Exam: BP (!) 163/77   Pulse 85   Ht '5\' 10"'$  (1.778 m)   Wt (!) 324 lb 6.4 oz (147.1 kg)   BMI 46.55  kg/m   Constitutional:  Alert and oriented, no acute distress, nontoxic appearing HEENT: [place], AT Cardiovascular: No clubbing, cyanosis, or edema Respiratory: Normal respiratory effort, no increased work of breathing Skin: No rashes, bruises or suspicious lesions Neurologic: Grossly intact, no focal deficits, moving all 4 extremities Psychiatric: Normal mood and affect  Laboratory Data: Results for orders placed or performed in visit on 05/23/22  CULTURE, URINE COMPREHENSIVE   Specimen: Urine   UR  Result Value Ref Range   Urine Culture, Comprehensive Final report (A)    Organism ID, Bacteria Enterococcus faecalis (A)    ANTIMICROBIAL SUSCEPTIBILITY Comment   Microscopic Examination   Urine  Result Value Ref Range   WBC, UA 0-5 0 - 5 /hpf   RBC, Urine 3-10 (A) 0 - 2 /hpf   Epithelial Cells (non renal) 0-10 0 - 10 /hpf   Bacteria, UA Few None seen/Few  Urinalysis, Complete  Result Value Ref Range   Specific Gravity, UA 1.020 1.005 - 1.030   pH, UA 7.0 5.0 - 7.5   Color, UA Yellow Yellow   Appearance Ur Clear Clear   Leukocytes,UA Negative Negative   Protein,UA 3+ (A) Negative/Trace   Glucose, UA 1+ (A) Negative    Ketones, UA Negative Negative   RBC, UA 1+ (A) Negative   Bilirubin, UA Negative Negative   Urobilinogen, Ur 0.2 0.2 - 1.0 mg/dL   Nitrite, UA Negative Negative   Microscopic Examination See below:    Pertinent Imaging: STAT CT stone study, 05/23/2022: CLINICAL DATA:  Flank pain.   EXAM: CT ABDOMEN AND PELVIS WITHOUT CONTRAST   TECHNIQUE: Multidetector CT imaging of the abdomen and pelvis was performed following the standard protocol without IV contrast.   RADIATION DOSE REDUCTION: This exam was performed according to the departmental dose-optimization program which includes automated exposure control, adjustment of the mA and/or kV according to patient size and/or use of iterative reconstruction technique.   COMPARISON:  CT abdomen and pelvis 02/26/2022   FINDINGS: Lower chest: There is some chronic interstitial opacities in both lung bases similar to prior. There is a small pericardial effusion, similar to prior. There is also trace left pleural effusion.   Hepatobiliary: No focal liver abnormality is seen. Status post cholecystectomy. No biliary dilatation.   Pancreas: Unremarkable. No pancreatic ductal dilatation or surrounding inflammatory changes.   Spleen: Normal in size without focal abnormality.   Adrenals/Urinary Tract: There are 3 punctate calculi in the superior pole the right kidney measuring 3 mm each. There is a cyst in the superior pole the right kidney measuring 4 cm. There is no hydronephrosis or perinephric fluid collection. No ureteral or bladder calculi are seen. The bladder and adrenal glands are within normal limits.   Stomach/Bowel: Stomach is within normal limits. No evidence of bowel wall thickening, distention, or inflammatory changes. There is diffuse colonic diverticulosis. The appendix is not visualized.   Vascular/Lymphatic: Aortic atherosclerosis. No enlarged abdominal or pelvic lymph nodes.   Reproductive: Prostate is unremarkable.    Other: There is a small fat containing umbilical hernia. No ascites. There is some subcutaneous density just beneath the skin surface in the upper left anterior abdominal wall which is unchanged, possibly scarring.   Musculoskeletal: Degenerative changes affect the spine.   IMPRESSION: 1. Nonobstructing right renal calculi. No hydronephrosis or ureteral calculus. 2. Colonic diverticulosis without evidence for diverticulitis. 3. Stable small pericardial effusion. 4. Trace left pleural effusion. 5. Right Bosniak II benign renal cyst measuring 4 cm. No follow-up imaging  is recommended. JACR 2018 Feb; 264-273, Management of the Incidental Renal Mass on CT, [employer] 2021; 814-848, Bosniak Classification of Cystic Renal Masses, Version 2019.   Aortic Atherosclerosis (ICD10-I70.0).     Electronically Signed   By: Ronney Asters M.D.   On: 05/23/2022 15:40  I personally reviewed the images referenced above and note no ureteral stones or hydronephrosis.  He has stable nonobstructing right upper pole stones  Assessment & Plan:   1. Flank pain with history of urolithiasis UA today is rather bland, though he does have some mild microscopic hematuria.  With his complicated stone history, I sent him from clinic today for a stat CT stone study.  I was concerned that with his history of uric acid stones, a KUB would be noncontributory.  Fortunately, no evidence of acute stone episode on stat CT today.  We discussed that his pain is more likely musculoskeletal in origin.  He was relieved. - Urinalysis, Complete - CT RENAL STONE STUDY; Future - CULTURE, URINE COMPREHENSIVE  2. Microscopic hematuria Mild microscopic hematuria on UA today, UA otherwise rather bland.  He underwent intraoperative cystoscopy with Dr. Bernardo Heater in August of this year, so I do not think he needs a repeat cystoscopy at this time.  We will continue to monitor.  Return if symptoms worsen or fail to  improve.  Debroah Loop, PA-C  [facility] [address], [place], [place] [address] [phone]

## 2022-05-24 DIAGNOSIS — Z794 Long term (current) use of insulin: Secondary | ICD-10-CM | POA: Diagnosis not present

## 2022-05-24 DIAGNOSIS — E1140 Type 2 diabetes mellitus with diabetic neuropathy, unspecified: Secondary | ICD-10-CM | POA: Diagnosis not present

## 2022-05-24 DIAGNOSIS — B351 Tinea unguium: Secondary | ICD-10-CM | POA: Diagnosis not present

## 2022-05-27 ENCOUNTER — Telehealth: Payer: Self-pay | Admitting: *Deleted

## 2022-05-27 LAB — CULTURE, URINE COMPREHENSIVE

## 2022-05-27 NOTE — Patient Outreach (Signed)
  Care Coordination   Initial Visit Note   05/27/2022 Name: Serigne Kubicek MRN: [id number] DOB: [birth year]  Oralia Manis is a 81 y.o. year old male who sees Feldpausch, Chrissie Noa, MD for primary care. I spoke with  Oralia Manis by phone today.  What matters to the patients health and wellness today?  No health concerns voiced. RN discussed Geraldine, RN, SW, and Pharmacist. Patient declined services. Per patient he had [facility] doing these services too.      Goals Addressed             This Visit's Progress    Advised to follow up with AWV and Vaccines          SDOH assessments and interventions completed:  Yes  SDOH Interventions Today    Flowsheet Row Most Recent Value  SDOH Interventions   Food Insecurity Interventions Intervention Not Indicated  Housing Interventions Intervention Not Indicated  Transportation Interventions Intervention Not Indicated        Care Coordination Interventions Activated:  Yes  Care Coordination Interventions:  Yes, provided  Care coordination program/ services discussed  Social determinants of health survey completed Annual wellness visit discussed and patient advised to contact provider office to schedule Patient advised to contact primary care provider office  if care coordination services needed in the future  Follow up plan: No further intervention required.   Encounter Outcome:  Pt. Visit Completed   [place] Management [phone]

## 2022-05-29 DIAGNOSIS — N184 Chronic kidney disease, stage 4 (severe): Secondary | ICD-10-CM | POA: Diagnosis not present

## 2022-06-01 ENCOUNTER — Other Ambulatory Visit: Payer: Self-pay | Admitting: Urology

## 2022-06-06 DIAGNOSIS — H43813 Vitreous degeneration, bilateral: Secondary | ICD-10-CM | POA: Diagnosis not present

## 2022-06-07 DIAGNOSIS — J301 Allergic rhinitis due to pollen: Secondary | ICD-10-CM | POA: Diagnosis not present

## 2022-06-07 DIAGNOSIS — G4733 Obstructive sleep apnea (adult) (pediatric): Secondary | ICD-10-CM | POA: Diagnosis not present

## 2022-06-10 DIAGNOSIS — N184 Chronic kidney disease, stage 4 (severe): Secondary | ICD-10-CM | POA: Diagnosis not present

## 2022-06-12 ENCOUNTER — Inpatient Hospital Stay: Payer: PPO

## 2022-06-12 ENCOUNTER — Other Ambulatory Visit: Payer: Self-pay

## 2022-06-12 ENCOUNTER — Inpatient Hospital Stay
Admission: EM | Admit: 2022-06-12 | Discharge: 2022-06-20 | DRG: 291 | Disposition: A | Discharge status: Home Health | Payer: PPO | Attending: Internal Medicine | Admitting: Internal Medicine

## 2022-06-12 ENCOUNTER — Emergency Department: Payer: PPO

## 2022-06-12 DIAGNOSIS — Z9049 Acquired absence of other specified parts of digestive tract: Secondary | ICD-10-CM

## 2022-06-12 DIAGNOSIS — I509 Heart failure, unspecified: Secondary | ICD-10-CM

## 2022-06-12 DIAGNOSIS — Z8249 Family history of ischemic heart disease and other diseases of the circulatory system: Secondary | ICD-10-CM

## 2022-06-12 DIAGNOSIS — R0902 Hypoxemia: Secondary | ICD-10-CM | POA: Diagnosis present

## 2022-06-12 DIAGNOSIS — I10 Essential (primary) hypertension: Secondary | ICD-10-CM | POA: Diagnosis not present

## 2022-06-12 DIAGNOSIS — Z85828 Personal history of other malignant neoplasm of skin: Secondary | ICD-10-CM

## 2022-06-12 DIAGNOSIS — R0600 Dyspnea, unspecified: Principal | ICD-10-CM

## 2022-06-12 DIAGNOSIS — G4733 Obstructive sleep apnea (adult) (pediatric): Secondary | ICD-10-CM | POA: Diagnosis not present

## 2022-06-12 DIAGNOSIS — I2489 Other forms of acute ischemic heart disease: Secondary | ICD-10-CM | POA: Diagnosis not present

## 2022-06-12 DIAGNOSIS — T502X5A Adverse effect of carbonic-anhydrase inhibitors, benzothiadiazides and other diuretics, initial encounter: Secondary | ICD-10-CM | POA: Diagnosis not present

## 2022-06-12 DIAGNOSIS — Z87442 Personal history of urinary calculi: Secondary | ICD-10-CM

## 2022-06-12 DIAGNOSIS — I5033 Acute on chronic diastolic (congestive) heart failure: Secondary | ICD-10-CM | POA: Diagnosis present

## 2022-06-12 DIAGNOSIS — I5023 Acute on chronic systolic (congestive) heart failure: Secondary | ICD-10-CM | POA: Diagnosis not present

## 2022-06-12 DIAGNOSIS — Z881 Allergy status to other antibiotic agents status: Secondary | ICD-10-CM

## 2022-06-12 DIAGNOSIS — Z885 Allergy status to narcotic agent status: Secondary | ICD-10-CM

## 2022-06-12 DIAGNOSIS — N2581 Secondary hyperparathyroidism of renal origin: Secondary | ICD-10-CM | POA: Diagnosis present

## 2022-06-12 DIAGNOSIS — E1129 Type 2 diabetes mellitus with other diabetic kidney complication: Secondary | ICD-10-CM | POA: Diagnosis present

## 2022-06-12 DIAGNOSIS — Z1152 Encounter for screening for COVID-19: Secondary | ICD-10-CM | POA: Diagnosis not present

## 2022-06-12 DIAGNOSIS — I129 Hypertensive chronic kidney disease with stage 1 through stage 4 chronic kidney disease, or unspecified chronic kidney disease: Secondary | ICD-10-CM | POA: Diagnosis not present

## 2022-06-12 DIAGNOSIS — J90 Pleural effusion, not elsewhere classified: Secondary | ICD-10-CM | POA: Diagnosis not present

## 2022-06-12 DIAGNOSIS — E1122 Type 2 diabetes mellitus with diabetic chronic kidney disease: Secondary | ICD-10-CM | POA: Diagnosis present

## 2022-06-12 DIAGNOSIS — Z9841 Cataract extraction status, right eye: Secondary | ICD-10-CM

## 2022-06-12 DIAGNOSIS — Z961 Presence of intraocular lens: Secondary | ICD-10-CM | POA: Diagnosis present

## 2022-06-12 DIAGNOSIS — E876 Hypokalemia: Secondary | ICD-10-CM | POA: Diagnosis not present

## 2022-06-12 DIAGNOSIS — I2510 Atherosclerotic heart disease of native coronary artery without angina pectoris: Secondary | ICD-10-CM | POA: Diagnosis present

## 2022-06-12 DIAGNOSIS — E785 Hyperlipidemia, unspecified: Secondary | ICD-10-CM | POA: Diagnosis present

## 2022-06-12 DIAGNOSIS — Z79899 Other long term (current) drug therapy: Secondary | ICD-10-CM

## 2022-06-12 DIAGNOSIS — T501X5A Adverse effect of loop [high-ceiling] diuretics, initial encounter: Secondary | ICD-10-CM | POA: Diagnosis not present

## 2022-06-12 DIAGNOSIS — N179 Acute kidney failure, unspecified: Secondary | ICD-10-CM | POA: Diagnosis not present

## 2022-06-12 DIAGNOSIS — N189 Chronic kidney disease, unspecified: Secondary | ICD-10-CM | POA: Diagnosis not present

## 2022-06-12 DIAGNOSIS — Z808 Family history of malignant neoplasm of other organs or systems: Secondary | ICD-10-CM

## 2022-06-12 DIAGNOSIS — Y92239 Unspecified place in hospital as the place of occurrence of the external cause: Secondary | ICD-10-CM | POA: Diagnosis not present

## 2022-06-12 DIAGNOSIS — D631 Anemia in chronic kidney disease: Secondary | ICD-10-CM | POA: Diagnosis present

## 2022-06-12 DIAGNOSIS — Z794 Long term (current) use of insulin: Secondary | ICD-10-CM

## 2022-06-12 DIAGNOSIS — R0789 Other chest pain: Secondary | ICD-10-CM | POA: Diagnosis not present

## 2022-06-12 DIAGNOSIS — R0689 Other abnormalities of breathing: Secondary | ICD-10-CM | POA: Diagnosis not present

## 2022-06-12 DIAGNOSIS — R079 Chest pain, unspecified: Secondary | ICD-10-CM | POA: Diagnosis present

## 2022-06-12 DIAGNOSIS — E1140 Type 2 diabetes mellitus with diabetic neuropathy, unspecified: Secondary | ICD-10-CM | POA: Diagnosis present

## 2022-06-12 DIAGNOSIS — Z9842 Cataract extraction status, left eye: Secondary | ICD-10-CM

## 2022-06-12 DIAGNOSIS — N170 Acute kidney failure with tubular necrosis: Secondary | ICD-10-CM | POA: Diagnosis not present

## 2022-06-12 DIAGNOSIS — J984 Other disorders of lung: Secondary | ICD-10-CM | POA: Diagnosis not present

## 2022-06-12 DIAGNOSIS — Z6841 Body Mass Index (BMI) 40.0 and over, adult: Secondary | ICD-10-CM | POA: Diagnosis not present

## 2022-06-12 DIAGNOSIS — I130 Hypertensive heart and chronic kidney disease with heart failure and stage 1 through stage 4 chronic kidney disease, or unspecified chronic kidney disease: Secondary | ICD-10-CM | POA: Diagnosis not present

## 2022-06-12 DIAGNOSIS — J45909 Unspecified asthma, uncomplicated: Secondary | ICD-10-CM | POA: Diagnosis not present

## 2022-06-12 DIAGNOSIS — E1169 Type 2 diabetes mellitus with other specified complication: Secondary | ICD-10-CM | POA: Diagnosis not present

## 2022-06-12 DIAGNOSIS — Z825 Family history of asthma and other chronic lower respiratory diseases: Secondary | ICD-10-CM

## 2022-06-12 DIAGNOSIS — R918 Other nonspecific abnormal finding of lung field: Secondary | ICD-10-CM | POA: Diagnosis not present

## 2022-06-12 DIAGNOSIS — G4730 Sleep apnea, unspecified: Secondary | ICD-10-CM | POA: Diagnosis not present

## 2022-06-12 DIAGNOSIS — Z7982 Long term (current) use of aspirin: Secondary | ICD-10-CM

## 2022-06-12 DIAGNOSIS — I110 Hypertensive heart disease with heart failure: Secondary | ICD-10-CM | POA: Diagnosis not present

## 2022-06-12 DIAGNOSIS — N184 Chronic kidney disease, stage 4 (severe): Secondary | ICD-10-CM | POA: Diagnosis not present

## 2022-06-12 DIAGNOSIS — R0602 Shortness of breath: Secondary | ICD-10-CM | POA: Diagnosis not present

## 2022-06-12 DIAGNOSIS — J9811 Atelectasis: Secondary | ICD-10-CM | POA: Diagnosis not present

## 2022-06-12 DIAGNOSIS — E66813 Obesity, class 3: Secondary | ICD-10-CM | POA: Diagnosis present

## 2022-06-12 LAB — BASIC METABOLIC PANEL
Anion gap: 7 (ref 5–15)
BUN: 56 mg/dL — ABNORMAL HIGH (ref 8–23)
CO2: 25 mmol/L (ref 22–32)
Calcium: 8.9 mg/dL (ref 8.9–10.3)
Chloride: 108 mmol/L (ref 98–111)
Creatinine, Ser: 2.9 mg/dL — ABNORMAL HIGH (ref 0.61–1.24)
GFR, Estimated: 21 mL/min — ABNORMAL LOW (ref >60)
Glucose, Bld: 184 mg/dL — ABNORMAL HIGH (ref 70–99)
Potassium: 4.3 mmol/L (ref 3.5–5.1)
Sodium: 140 mmol/L (ref 135–145)

## 2022-06-12 LAB — CBC
HCT: 36.7 % — ABNORMAL LOW (ref 39.0–52.0)
Hemoglobin: 11.7 g/dL — ABNORMAL LOW (ref 13.0–17.0)
MCH: 28.6 pg (ref 26.0–34.0)
MCHC: 31.9 g/dL (ref 30.0–36.0)
MCV: 89.7 fL (ref 80.0–100.0)
Platelets: 307 10*3/uL (ref 150–400)
RBC: 4.09 MIL/uL — ABNORMAL LOW (ref 4.22–5.81)
RDW: 13.8 % (ref 11.5–15.5)
WBC: 11.8 10*3/uL — ABNORMAL HIGH (ref 4.0–10.5)
nRBC: 0 % (ref 0.0–0.2)

## 2022-06-12 LAB — GLUCOSE, CAPILLARY: Glucose-Capillary: 167 mg/dL — ABNORMAL HIGH (ref 70–99)

## 2022-06-12 LAB — TROPONIN I (HIGH SENSITIVITY)
Troponin I (High Sensitivity): 61 ng/L — ABNORMAL HIGH (ref <18)
Troponin I (High Sensitivity): 62 ng/L — ABNORMAL HIGH (ref <18)
Troponin I (High Sensitivity): 75 ng/L — ABNORMAL HIGH (ref <18)

## 2022-06-12 LAB — CBG MONITORING, ED: Glucose-Capillary: 194 mg/dL — ABNORMAL HIGH (ref 70–99)

## 2022-06-12 LAB — RESP PANEL BY RT-PCR (FLU A&B, COVID) ARPGX2
Influenza A by PCR: NEGATIVE
Influenza B by PCR: NEGATIVE
SARS Coronavirus 2 by RT PCR: NEGATIVE

## 2022-06-12 LAB — BRAIN NATRIURETIC PEPTIDE: B Natriuretic Peptide: 415.8 pg/mL — ABNORMAL HIGH (ref 0.0–100.0)

## 2022-06-12 MED ORDER — ACETAMINOPHEN 325 MG PO TABS: 650.0000 mg | ORAL_TABLET | Freq: Four times a day (QID) | ORAL | Status: DC | PRN

## 2022-06-12 MED ORDER — HYDRALAZINE HCL 50 MG PO TABS
50.0000 mg | ORAL_TABLET | Freq: Three times a day (TID) | ORAL | Status: DC
  Administered 2022-06-12 – 2022-06-20 (×24): 50 mg via ORAL
  Filled 2022-06-12 (×24): qty 1

## 2022-06-12 MED ORDER — FUROSEMIDE 10 MG/ML IJ SOLN
60.0000 mg | Freq: Two times a day (BID) | INTRAMUSCULAR | Status: DC
  Administered 2022-06-12: 60 mg via INTRAVENOUS
  Filled 2022-06-12 (×2): qty 6

## 2022-06-12 MED ORDER — ONDANSETRON HCL 4 MG PO TABS: 4.0000 mg | ORAL_TABLET | Freq: Four times a day (QID) | ORAL | Status: DC | PRN

## 2022-06-12 MED ORDER — CALCITRIOL 0.25 MCG PO CAPS
0.2500 ug | ORAL_CAPSULE | Freq: Every day | ORAL | Status: DC
  Administered 2022-06-13 – 2022-06-20 (×8): 0.25 ug via ORAL
  Filled 2022-06-12 (×8): qty 1

## 2022-06-12 MED ORDER — METOPROLOL SUCCINATE ER 50 MG PO TB24
50.0000 mg | ORAL_TABLET | Freq: Every day | ORAL | Status: DC
  Administered 2022-06-13 – 2022-06-20 (×8): 50 mg via ORAL
  Filled 2022-06-12 (×8): qty 1

## 2022-06-12 MED ORDER — HEPARIN SODIUM (PORCINE) 5000 UNIT/ML IJ SOLN
5000.0000 [IU] | Freq: Three times a day (TID) | INTRAMUSCULAR | Status: DC
  Administered 2022-06-12 – 2022-06-20 (×24): 5000 [IU] via SUBCUTANEOUS
  Filled 2022-06-12 (×24): qty 1

## 2022-06-12 MED ORDER — INSULIN DETEMIR 100 UNIT/ML ~~LOC~~ SOLN
50.0000 [IU] | Freq: Every day | SUBCUTANEOUS | Status: DC
  Administered 2022-06-13 – 2022-06-15 (×3): 50 [IU] via SUBCUTANEOUS
  Filled 2022-06-12 (×4): qty 0.5

## 2022-06-12 MED ORDER — TRAMADOL HCL 50 MG PO TABS: 50.0000 mg | ORAL_TABLET | Freq: Two times a day (BID) | ORAL | Status: DC | PRN

## 2022-06-12 MED ORDER — INSULIN ASPART 100 UNIT/ML IJ SOLN
0.0000 [IU] | Freq: Three times a day (TID) | INTRAMUSCULAR | Status: DC
  Administered 2022-06-12: 4 [IU] via SUBCUTANEOUS
  Administered 2022-06-13: 11 [IU] via SUBCUTANEOUS
  Administered 2022-06-13: 7 [IU] via SUBCUTANEOUS
  Administered 2022-06-13: 11 [IU] via SUBCUTANEOUS
  Administered 2022-06-14: 4 [IU] via SUBCUTANEOUS
  Administered 2022-06-14: 7 [IU] via SUBCUTANEOUS
  Administered 2022-06-14: 11 [IU] via SUBCUTANEOUS
  Administered 2022-06-15: 3 [IU] via SUBCUTANEOUS
  Administered 2022-06-15: 7 [IU] via SUBCUTANEOUS
  Administered 2022-06-15: 11 [IU] via SUBCUTANEOUS
  Administered 2022-06-16 (×2): 7 [IU] via SUBCUTANEOUS
  Administered 2022-06-16: 4 [IU] via SUBCUTANEOUS
  Administered 2022-06-17: 7 [IU] via SUBCUTANEOUS
  Administered 2022-06-17 (×2): 11 [IU] via SUBCUTANEOUS
  Administered 2022-06-18: 4 [IU] via SUBCUTANEOUS
  Administered 2022-06-18: 11 [IU] via SUBCUTANEOUS
  Administered 2022-06-18: 3 [IU] via SUBCUTANEOUS
  Administered 2022-06-19 – 2022-06-20 (×4): 7 [IU] via SUBCUTANEOUS
  Filled 2022-06-12 (×24): qty 1

## 2022-06-12 MED ORDER — SODIUM CHLORIDE 0.9% FLUSH
3.0000 mL | Freq: Two times a day (BID) | INTRAVENOUS | Status: DC
  Administered 2022-06-13 – 2022-06-19 (×12): 3 mL via INTRAVENOUS

## 2022-06-12 MED ORDER — ONDANSETRON HCL 4 MG/2ML IJ SOLN: 4.0000 mg | Freq: Four times a day (QID) | INTRAMUSCULAR | Status: DC | PRN

## 2022-06-12 MED ORDER — TAMSULOSIN HCL 0.4 MG PO CAPS
0.4000 mg | ORAL_CAPSULE | Freq: Every day | ORAL | Status: DC
  Administered 2022-06-13 – 2022-06-20 (×8): 0.4 mg via ORAL
  Filled 2022-06-12 (×8): qty 1

## 2022-06-12 MED ORDER — PRAVASTATIN SODIUM 40 MG PO TABS
40.0000 mg | ORAL_TABLET | Freq: Every day | ORAL | Status: DC
  Administered 2022-06-12 – 2022-06-19 (×8): 40 mg via ORAL
  Filled 2022-06-12 (×7): qty 1
  Filled 2022-06-12: qty 2

## 2022-06-12 MED ORDER — AMLODIPINE BESYLATE 10 MG PO TABS
10.0000 mg | ORAL_TABLET | Freq: Every day | ORAL | Status: DC
  Administered 2022-06-13 – 2022-06-20 (×8): 10 mg via ORAL
  Filled 2022-06-12 (×8): qty 1

## 2022-06-12 MED ORDER — FUROSEMIDE 10 MG/ML IJ SOLN
60.0000 mg | Freq: Once | INTRAMUSCULAR | Status: AC
  Administered 2022-06-12: 60 mg via INTRAVENOUS
  Filled 2022-06-12: qty 8

## 2022-06-12 MED ORDER — SODIUM CHLORIDE 0.9 % IV SOLN: 250.0000 mL | INTRAVENOUS | Status: DC | PRN

## 2022-06-12 MED ORDER — POTASSIUM CITRATE ER 10 MEQ (1080 MG) PO TBCR
10.0000 meq | EXTENDED_RELEASE_TABLET | Freq: Two times a day (BID) | ORAL | Status: DC
  Administered 2022-06-12 – 2022-06-14 (×5): 10 meq via ORAL
  Filled 2022-06-12 (×6): qty 1

## 2022-06-12 MED ORDER — ASPIRIN 81 MG PO TBEC
81.0000 mg | DELAYED_RELEASE_TABLET | Freq: Every day | ORAL | Status: DC
  Administered 2022-06-13 – 2022-06-20 (×8): 81 mg via ORAL
  Filled 2022-06-12 (×8): qty 1

## 2022-06-12 MED ORDER — SODIUM CHLORIDE 0.9% FLUSH: 3.0000 mL | INTRAVENOUS | Status: DC | PRN

## 2022-06-12 NOTE — ED Notes (Signed)
Informed RN bed assigned 

## 2022-06-12 NOTE — ED Provider Notes (Signed)
[facility] Provider Note   Event Date/Time   First MD Initiated Contact with Patient 06/12/22 1329     (approximate)  History   Shortness of Breath  HPI  James Clarke is a 81 y.o. male who on review of discharge summary from August 21 of this year has a history of acute renal failure, chronic kidney disease type 2 diabetes hypertension diastolic CHF morbid obesity and edema   Since last night has noticed shortness of breath.  Also has noticed about 10 pounds of weight gain since August and a little bit of swelling increasing around his both ankles for the last couple days wearing compression stockings.  Relates that last night when he tried to lay down he felt very short of breath he felt better and had to sleep sitting up in his chair with his CPAP mask on.  Right now he feels just a little short of breath but if you were to lay down he reports it is notably short of breath  Follows with nephrology for history of kidney disease.  Takes furosemide daily.  Physical Exam   Triage Vital Signs: ED Triage Vitals  Enc Vitals Group     BP 06/12/22 1249 (!) 157/71     Pulse Rate 06/12/22 1249 77     Resp 06/12/22 1249 (!) 26     Temp 06/12/22 1249 98.2 F (36.8 C)     Temp Source 06/12/22 1249 Oral     SpO2 06/12/22 1249 98 %     Weight 06/12/22 1248 (!) 324 lb (147 kg)     Height 06/12/22 1248 '5\' 10"'$  (1.778 m)     Head Circumference --      Peak Flow --      Pain Score 06/12/22 1248 8     Pain Loc --      Pain Edu? --      Excl. in [place]? --     Most recent vital signs: Vitals:   06/12/22 1325 06/12/22 1400  BP: (!) 167/71 (!) 163/74  Pulse: 72 70  Resp:  (!) 23  Temp:    SpO2: 91% 97%     General: Awake, no distress.  Does appear to slightly tachypneic though.  But speaks in full clear sentences CV:  Good peripheral perfusion.  Normal heart tones Resp:  Normal effort.  Slight tachypnea.  Speaks in full sentences without oxygen deficit.  He  does have scant Rales in the lower bases bilaterally but no wheezing.  No coughing. Abd:  No distention.  Soft Other:  Mild bilateral lower extremity edema, compression stockings.  No unilateral swelling   ED Results / Procedures / Treatments   Labs (all labs ordered are listed, but only abnormal results are displayed) Labs Reviewed  BASIC METABOLIC PANEL - Abnormal; Notable for the following components:      Result Value   Glucose, Bld 184 (*)    BUN 56 (*)    Creatinine, Ser 2.90 (*)    GFR, Estimated 21 (*)    All other components within normal limits  CBC - Abnormal; Notable for the following components:   WBC 11.8 (*)    RBC 4.09 (*)    Hemoglobin 11.7 (*)    HCT 36.7 (*)    All other components within normal limits  BRAIN NATRIURETIC PEPTIDE - Abnormal; Notable for the following components:   B Natriuretic Peptide 415.8 (*)    All other components within normal limits  TROPONIN I (HIGH SENSITIVITY) - Abnormal; Notable for the following components:   Troponin I (High Sensitivity) 61 (*)    All other components within normal limits  RESP PANEL BY RT-PCR (FLU A&B, COVID) [id number]  TROPONIN I (HIGH SENSITIVITY)     EKG  Interpreted by me at 1245 heart rate 80 QRS 110 QTc 440 Normal sinus rhythm, mild nonspecific T wave abnormality.    RADIOLOGY  Chest x-ray interpreted by me as vascular congestion   PROCEDURES:  Critical Care performed: No  Procedures   MEDICATIONS ORDERED IN ED: Medications  furosemide (LASIX) injection 60 mg (60 mg Intravenous Given 06/12/22 1417)     IMPRESSION / MDM / ASSESSMENT AND PLAN / ED COURSE  I reviewed the triage vital signs and the nursing notes.                              Differential diagnosis includes, but is not limited to, CHF exacerbation, volume overload, acute kidney injury, pneumonia or viral infection though this seems much less likely with afebrile status and no infectious complaints, coronary disease,  pneumothorax, COPD or asthma exacerbation etc.  Based on workup in the ED he does have mild [place] cytosis, but notably elevated BNP.  No chest pain but felt some slight chest pressure with a troponin that is minimally elevated I suspect this is demand ischemia as he has no accompanying obvious ischemic abnormalities.  Creatinine 2.9 baseline approximately 3.  COVID and flu testing negative.  Based on elevated BNP, hypervolemic appearance on examination we will start by providing IV diuresis, and given the patient's comorbidities, tachypnea, and chronic renal disease plan to admit to the hospitalist service for further care and treatment  Patient's presentation is most consistent with acute presentation with potential threat to life or bodily function.  The patient is on the cardiac monitor to evaluate for evidence of arrhythmia and/or significant heart rate changes.   Consulted with her hospitalist Dr. Francine Graven.  Patient will be admitted for further care and treatment to the hospitalist service.  Patient and his family including wife at the bedside understanding agreeable with plan for admission     FINAL CLINICAL IMPRESSION(S) / ED DIAGNOSES   Final diagnoses:  Dyspnea, unspecified type  Acute on chronic congestive heart failure, unspecified heart failure type ([place])     Rx / DC Orders   ED Discharge Orders     None        Note:  This document was prepared using Dragon voice recognition software and may include unintentional dictation errors.   Delman Kitten, MD 06/12/22 1526

## 2022-06-12 NOTE — Assessment & Plan Note (Deleted)
Patient with complaints of chest pain which she describes as feeling like someone is sitting on his chest. Troponin is elevated and may be secondary to demand ischemia from acute CHF We will cycle cardiac enzymes Continue aspirin, metoprolol and statins

## 2022-06-12 NOTE — Assessment & Plan Note (Signed)
Secondary to morbid obesity Continue CPAP at bedtime

## 2022-06-12 NOTE — H&P (Signed)
History and Physical    Patient: James Clarke XBJ:[id number] DOB: August 17, 1940 DOA: 06/12/2022 DOS: the patient was seen and examined on 06/12/2022 PCP: Sofie Hartigan, MD  Patient coming from: Home  Chief Complaint:  Chief Complaint  Patient presents with   Shortness of Breath   HPI: James Clarke is a 81 y.o. male with medical history significant for diabetes mellitus with complications of stage IV chronic kidney disease, morbid obesity (BMI 46.49 kg/m2), hypertension, obstructive sleep apnea, history of nephrolithiasis, chronic diastolic dysfunction CHF with last known LVEF of 55 to 60% from 08/23 who presents to the ER via EMS for evaluation of worsening shortness of breath.  Patient states that he has had shortness of breath for several days but worse 1 day prior to his admission.  He states that he slept in his recliner with his CPAP because he could not breathe laying flat.  He also complains of midsternal chest pain which he describes as feeling like someone is sitting on his chest.  He rates it a 7 x 10 in intensity at its worst.  It is nonradiating and he denies having any nausea, no vomiting, no palpitations or diaphoresis.  He has mild lower extremity swelling.  He has been taking his Lasix as prescribed every other day but admits to drinking too much fluids.  His wife notes that his urine output has reduced despite taking the diuretics. He denies having any cough, no fever, no chills, no headache, no abdominal pain, no changes in his bowel habits, no dizziness, no lightheadedness or blurred vision. Labs show elevated BNP level Chest x-ray reviewed by me shows enlargement of cardiac silhouette with bibasilar infiltrates question pulmonary edema. Small associated LEFT pleural effusion Twelve-lead EKG reviewed by me shows normal sinus rhythm with sinus arrhythmia.  Nonspecific ST and T wave changes Patient received Lasix 60 mg IV in the ER and will be admitted to the hospital  for further evaluation   Review of Systems: As mentioned in the history of present illness. All other systems reviewed and are negative. Past Medical History:  Diagnosis Date   Asthma    Bell's palsy    SEVERAL TIMES   Bell's palsy    Cancer ([place])    SKIN   Diabetes mellitus without complication ([place])    Dyspnea    DOE   History of kidney stones    Hyperlipemia    Hypertension    Kidney stone    Neuropathy    Obesity    Sleep apnea    C PAP   Past Surgical History:  Procedure Laterality Date   APPENDECTOMY     CATARACT EXTRACTION W/PHACO Left 05/20/2017   Procedure: CATARACT EXTRACTION PHACO AND INTRAOCULAR LENS PLACEMENT ([place]);  Surgeon: Birder Robson, MD;  Location: [facility];  Service: Ophthalmology;  Laterality: Left;  [place] 00:32.0 AP% 15.5 CDE 4.97 Fluid Pack Lot # [id number]   CATARACT EXTRACTION W/PHACO Right 06/10/2017   Procedure: CATARACT EXTRACTION PHACO AND INTRAOCULAR LENS PLACEMENT (IOC);  Surgeon: Birder Robson, MD;  Location: [facility];  Service: Ophthalmology;  Laterality: Right;  [place]  00:51 AP% 15.4 CDE 7.95 Fluid pack lot # [id number]   CHOLECYSTECTOMY     CYSTOSCOPY W/ RETROGRADES Right 08/13/2019   Procedure: CYSTOSCOPY WITH RETROGRADE PYELOGRAM;  Surgeon: Billey Co, MD;  Location: [facility];  Service: Urology;  Laterality: Right;   CYSTOSCOPY WITH STENT PLACEMENT Right 07/26/2019   Procedure: CYSTOSCOPY WITH STENT PLACEMENT;  Surgeon: Marton Redwood  III, MD;  Location: [facility];  Service: Urology;  Laterality: Right;   CYSTOSCOPY/URETEROSCOPY/HOLMIUM LASER/STENT PLACEMENT Right 08/13/2019   Procedure: CYSTOSCOPY/URETEROSCOPY/HOLMIUM LASER/STENT EXCHANGE;  Surgeon: Billey Co, MD;  Location: [facility];  Service: Urology;  Laterality: Right;   CYSTOSCOPY/URETEROSCOPY/HOLMIUM LASER/STENT PLACEMENT Left 02/27/2022   Procedure: CYSTOSCOPY/URETEROSCOPY/HOLMIUM LASER/STENT PLACEMENT;  Surgeon: Abbie Sons, MD;  Location: [facility];  Service:  Urology;  Laterality: Left;   FRACTURE SURGERY     WRIST   HAND SURGERY     HERNIA REPAIR     LEFT HEART CATH AND CORONARY ANGIOGRAPHY N/A 12/31/2021   Procedure: LEFT HEART CATH AND CORONARY ANGIOGRAPHY;  Surgeon: Corey Skains, MD;  Location: [place] [facility];  Service: Cardiovascular;  Laterality: N/A;   SHOULDER ARTHROSCOPY WITH SUBACROMIAL DECOMPRESSION AND OPEN ROTATOR C Right 09/11/2020   Procedure: Right shoulder arthroscopic rotator cuff repair vs Regeneten patch application, subacromial decompression, and biceps tenodesis - Reche Dixon to Assist;  Surgeon: Leim Fabry, MD;  Location: [facility];  Service: Orthopedics;  Laterality: Right;   TEMPORARY DIALYSIS CATHETER N/A 03/05/2022   Procedure: TEMPORARY DIALYSIS CATHETER;  Surgeon: Katha Cabal, MD;  Location: [place] [facility];  Service: Cardiovascular;  Laterality: N/A;   TONSILLECTOMY     URETEROSCOPY WITH HOLMIUM LASER LITHOTRIPSY Left 02/27/2022   Procedure: URETEROSCOPY WITH HOLMIUM LASER LITHOTRIPSY;  Surgeon: Abbie Sons, MD;  Location: [facility];  Service: Urology;  Laterality: Left;   Social History:  reports that he has never smoked. He has never been exposed to tobacco smoke. He has never used smokeless tobacco. He reports that he does not currently use drugs. He reports that he does not drink alcohol.  Allergies  Allergen Reactions   Codeine Nausea And Vomiting   Doxycycline    Erythromycin Rash    Family History  Problem Relation Age of Onset   Emphysema Mother    COPD Mother    Heart disease Mother    Brain cancer Father     Prior to Admission medications   Medication Sig Start Date End Date Taking? Authorizing Provider  acetaminophen (TYLENOL) 500 MG tablet Take by mouth.   Yes [provider]  albuterol (PROVENTIL HFA;VENTOLIN HFA) 108 (90 Base) MCG/ACT inhaler Inhale 2 puffs into the lungs every 6 (six) hours as needed for wheezing or shortness of breath.   Yes [provider]  amLODipine (NORVASC) 10 MG tablet Take 1 tablet (10 mg total) by mouth daily. 03/12/22  Yes Lorella Nimrod, MD  aspirin EC 81 MG tablet Take by mouth.   Yes [provider]  calcitRIOL (ROCALTROL) 0.25 MCG capsule Take 0.25 mcg by mouth daily. 07/13/19  Yes [provider]  fluticasone (FLONASE) 50 MCG/ACT nasal spray as needed. 03/17/17  Yes [provider]  furosemide (LASIX) 40 MG tablet Take 1 tablet (40 mg total) by mouth every other day. 03/13/22  Yes Lorella Nimrod, MD  hydrALAZINE (APRESOLINE) 50 MG tablet Take 50 mg by mouth 3 (three) times daily.   Yes [provider]  insulin aspart (NOVOLOG) 100 UNIT/ML injection Inject 30 units before breakfast and lunch. Inject 55 units before supper. 08/15/21  Yes [provider]  insulin detemir (LEVEMIR) 100 UNIT/ML injection Inject 0.3 mLs (30 Units total) into the skin daily. Patient taking differently: Inject 50 Units into the skin at bedtime. 03/11/22  Yes Lorella Nimrod, MD  lovastatin (MEVACOR) 40 MG tablet Take 40 mg by mouth daily with supper. 04/28/17  Yes  [provider]  metoprolol succinate (TOPROL-XL) 50 MG 24 hr tablet Take 50 mg by mouth daily. 09/01/21  Yes [provider]  potassium citrate (UROCIT-K) 10 MEQ (1080 MG) SR tablet Take 10 mEq by mouth in the morning and at bedtime.   Yes [provider]  tamsulosin (FLOMAX) 0.4 MG CAPS capsule TAKE (1) CAPSULE BY MOUTH EVERY DAY 06/03/22  Yes Stoioff, Ronda Fairly, MD  traMADol (ULTRAM) 50 MG tablet Take 50 mg by mouth every 12 (twelve) hours as needed. 04/05/21  Yes [provider]  cetirizine (ZYRTEC) 10 MG chewable tablet Chew by mouth.    [provider]  GLOBAL INJECT EASE INSULIN SYR 31G X 5/16" 0.5 ML MISC Inject into the skin. 08/16/21   [provider]  glucose blood (ONETOUCH ULTRA) test strip TEST BLOOD SUGAR 4 TIMES DAILY 10/26/18   [provider]  Lancets (ONETOUCH  DELICA PLUS [id number]) [place] USE 1 LANCET 4 TIMES DAILY 05/04/19   [provider]    Physical Exam: Vitals:   06/12/22 1248 06/12/22 1249 06/12/22 1325 06/12/22 1400  BP:  (!) 157/71 (!) 167/71 (!) 163/74  Pulse:  77 72 70  Resp:  (!) 26  (!) 23  Temp:  98.2 F (36.8 C)    TempSrc:  Oral    SpO2:  98% 91% 97%  Weight: (!) 147 kg     Height: '5\' 10"'$  (1.778 m)      Physical Exam Vitals and nursing note reviewed.  Constitutional:      Appearance: He is obese.     Comments: Appears to be in mild respiratory distress  HENT:     Head: Normocephalic and atraumatic.     Mouth/Throat:     Mouth: Mucous membranes are moist.  Eyes:     Pupils: Pupils are equal, round, and reactive to light.  Cardiovascular:     Rate and Rhythm: Normal rate and regular rhythm.  Pulmonary:     Effort: Tachypnea present.     Breath sounds: Examination of the right-lower field reveals rales. Examination of the left-lower field reveals rales. Rales present.  Abdominal:     General: Bowel sounds are normal.     Palpations: Abdomen is soft.     Comments: Central adiposity  Musculoskeletal:     Cervical back: Normal range of motion and neck supple.     Right lower leg: Edema present.     Left lower leg: Edema present.  Skin:    General: Skin is warm and dry.  Neurological:     General: No focal deficit present.     Mental Status: He is alert.  Psychiatric:        Mood and Affect: Mood normal.        Behavior: Behavior normal.     Data Reviewed: Relevant notes from primary care and specialist visits, past discharge summaries as available in EHR, including Care Everywhere. Prior diagnostic testing as pertinent to current admission diagnoses Updated medications and problem lists for reconciliation ED course, including vitals, labs, imaging, treatment and response to treatment Triage notes, nursing and pharmacy notes and ED provider's notes Notable results as noted in HPI Labs reviewed.   Sodium 140, potassium 4.3, chloride 108, bicarb 25, glucose 184, BUN 56, creatinine 2.90, calcium 8.9, troponin 61, BNP 415.8, white count 11.8, hemoglobin 11.7, hematocrit 36.7, platelet count 307 Respiratory viral is negative There are no new results to review at this time.  Assessment and Plan: * Acute  on chronic diastolic CHF (congestive heart failure) ([place]) Patient presents for evaluation of worsening shortness of breath associated with orthopnea and bilateral lower extremity swelling concerning for acute CHF Last 2D echocardiogram from 08/23 showed an LVEF of 55 to 60% with grade 2 diastolic dysfunction Place patient on Lasix 60 mg IV every 12 Optimize blood pressure control Continue metoprolol, amlodipine and hydralazine Consult cardiology  Type 2 diabetes mellitus with renal manifestations ([place]) Patient has a history of diabetes mellitus with complications of stage IV chronic kidney disease Maintain consistent carbohydrate diet Continue long-acting insulin with sliding scale coverage Monitor renal function closely since patient is on diuretics for worsening  Consult nephrology  Hypertension Continue amlodipine, metoprolol and hydralazine to optimize blood pressure control  Sleep apnea Secondary to morbid obesity Continue CPAP at bedtime  Morbid obesity due to excess calories (H[facility]) BMI 85.02 Complicates overall prognosis and care Lifestyle modification and exercise has been discussed with patient in detail  Chest pain at rest Patient with complaints of chest pain which she describes as feeling like someone is sitting on his chest. Troponin is elevated and may be secondary to demand ischemia from acute CHF We will cycle cardiac enzymes Continue aspirin, metoprolol and statins      Advance Care Planning:   Code Status: Full Code   Consults: Cardiology, nephrology  Family Communication: Greater than 50% of time was spent discussing patient's condition and plan of care  with him and his wife at the bedside.  All questions and concerns have been addressed.  They verbalized understanding and agree with the plan.  Severity of Illness: The appropriate patient status for this patient is INPATIENT. Inpatient status is judged to be reasonable and necessary in order to provide the required intensity of service to ensure the patient's safety. The patient's presenting symptoms, physical exam findings, and initial radiographic and laboratory data in the context of their chronic comorbidities is felt to place them at high risk for further clinical deterioration. Furthermore, it is not anticipated that the patient will be medically stable for discharge from the hospital within 2 midnights of admission.   * I certify that at the point of admission it is my clinical judgment that the patient will require inpatient hospital care spanning beyond 2 midnights from the point of admission due to high intensity of service, high risk for further deterioration and high frequency of surveillance required.*  Author: Collier Bullock, MD 06/12/2022 3:58 PM  For on call review [email or web address].

## 2022-06-12 NOTE — Consult Note (Signed)
[facility] Consultation Note  Patient ID: James Clarke, MRN: [id number], DOB/AGE: 1940-11-14 81 y.o. Admit date: 06/12/2022   Date of Consult: 06/12/2022 Primary Physician: Sofie Hartigan, MD Primary Cardiologist: Nehemiah Massed  Chief Complaint:  Chief Complaint  Patient presents with   Shortness of Breath   Reason for Consult:Pulmonary edema  HPI: 81 y.o. male with known coronary artery disease by cardiac catheterization approximately 6 months prior with moderate atherosclerosis but no evidence of critical coronary artery disease.  The patient also has sleep apnea for which she has used appropriate CPAP machine.  The patient does have significant amount of chronic kidney disease for which she has had temporary dialysis at times due to stage IV kidney disease.  The patient has had progression of lower extremity edema shortness of breath weight gain over the last 5 days for which it appears that he has had some pulmonary edema by chest x-ray.  Troponin is 61 EKG shows normal sinus rhythm with nonspecific ST changes.  The patient currently has felt slightly better with intravenous diuretics with over 500 cc out at this time.  The patient has also had an echocardiogram showing normal LV systolic function and no evidence of significant valvular heart disease.  This is now suggested that the patient primarily has chronic kidney disease with volume overload rather than a primary cardiac abnormality.  Past Medical History:  Diagnosis Date   Asthma    Bell's palsy    SEVERAL TIMES   Bell's palsy    Cancer ([place])    SKIN   Diabetes mellitus without complication ([place])    Dyspnea    DOE   History of kidney stones    Hyperlipemia    Hypertension    Kidney stone    Neuropathy    Obesity    Sleep apnea    C PAP      Surgical History:  Past Surgical History:  Procedure Laterality Date   APPENDECTOMY     CATARACT EXTRACTION W/PHACO Left 05/20/2017   Procedure: CATARACT  EXTRACTION PHACO AND INTRAOCULAR LENS PLACEMENT ([place]);  Surgeon: Birder Robson, MD;  Location: [facility];  Service: Ophthalmology;  Laterality: Left;  [place] 00:32.0 AP% 15.5 CDE 4.97 Fluid Pack Lot # [id number]   CATARACT EXTRACTION W/PHACO Right 06/10/2017   Procedure: CATARACT EXTRACTION PHACO AND INTRAOCULAR LENS PLACEMENT ([facility]);  Surgeon: Birder Robson, MD;  Location: [facility];  Service: Ophthalmology;  Laterality: Right;  [place]  00:51 AP% 15.4 CDE 7.95 Fluid pack lot # [id number]   CHOLECYSTECTOMY     CYSTOSCOPY W/ RETROGRADES Right 08/13/2019   Procedure: CYSTOSCOPY WITH RETROGRADE PYELOGRAM;  Surgeon: Billey Co, MD;  Location: [facility];  Service: Urology;  Laterality: Right;   CYSTOSCOPY WITH STENT PLACEMENT Right 07/26/2019   Procedure: CYSTOSCOPY WITH STENT PLACEMENT;  Surgeon: Lucas Mallow, MD;  Location: [facility];  Service: Urology;  Laterality: Right;   CYSTOSCOPY/URETEROSCOPY/HOLMIUM LASER/STENT PLACEMENT Right 08/13/2019   Procedure: CYSTOSCOPY/URETEROSCOPY/HOLMIUM LASER/STENT EXCHANGE;  Surgeon: Billey Co, MD;  Location: [facility];  Service: Urology;  Laterality: Right;   CYSTOSCOPY/URETEROSCOPY/HOLMIUM LASER/STENT PLACEMENT Left 02/27/2022   Procedure: CYSTOSCOPY/URETEROSCOPY/HOLMIUM LASER/STENT PLACEMENT;  Surgeon: Abbie Sons, MD;  Location: [facility];  Service: Urology;  Laterality: Left;   FRACTURE SURGERY     WRIST   HAND SURGERY     HERNIA REPAIR     LEFT HEART CATH AND CORONARY ANGIOGRAPHY N/A 12/31/2021   Procedure: LEFT HEART CATH AND CORONARY ANGIOGRAPHY;  Surgeon: Nehemiah Massed,  Everlean Cherry, MD;  Location: [place] [facility];  Service: Cardiovascular;  Laterality: N/A;   SHOULDER ARTHROSCOPY WITH SUBACROMIAL DECOMPRESSION AND OPEN ROTATOR C Right 09/11/2020   Procedure: Right shoulder arthroscopic rotator cuff repair vs Regeneten patch application, subacromial decompression, and biceps tenodesis - Reche Dixon to Assist;  Surgeon: Leim Fabry, MD;  Location:  [facility];  Service: Orthopedics;  Laterality: Right;   TEMPORARY DIALYSIS CATHETER N/A 03/05/2022   Procedure: TEMPORARY DIALYSIS CATHETER;  Surgeon: Katha Cabal, MD;  Location: [place] [facility];  Service: Cardiovascular;  Laterality: N/A;   TONSILLECTOMY     URETEROSCOPY WITH HOLMIUM LASER LITHOTRIPSY Left 02/27/2022   Procedure: URETEROSCOPY WITH HOLMIUM LASER LITHOTRIPSY;  Surgeon: Abbie Sons, MD;  Location: [facility];  Service: Urology;  Laterality: Left;     Home Meds: Prior to Admission medications   Medication Sig Start Date End Date Taking? Authorizing Provider  acetaminophen (TYLENOL) 500 MG tablet Take by mouth.   Yes [provider]  albuterol (PROVENTIL HFA;VENTOLIN HFA) 108 (90 Base) MCG/ACT inhaler Inhale 2 puffs into the lungs every 6 (six) hours as needed for wheezing or shortness of breath.   Yes [provider]  amLODipine (NORVASC) 10 MG tablet Take 1 tablet (10 mg total) by mouth daily. 03/12/22  Yes Lorella Nimrod, MD  aspirin EC 81 MG tablet Take by mouth.   Yes [provider]  calcitRIOL (ROCALTROL) 0.25 MCG capsule Take 0.25 mcg by mouth daily. 07/13/19  Yes [provider]  fluticasone (FLONASE) 50 MCG/ACT nasal spray as needed. 03/17/17  Yes [provider]  furosemide (LASIX) 40 MG tablet Take 1 tablet (40 mg total) by mouth every other day. 03/13/22  Yes Lorella Nimrod, MD  hydrALAZINE (APRESOLINE) 50 MG tablet Take 50 mg by mouth 3 (three) times daily.   Yes [provider]  insulin aspart (NOVOLOG) 100 UNIT/ML injection Inject 30 units before breakfast and lunch. Inject 55 units before supper. 08/15/21  Yes [provider]  insulin detemir (LEVEMIR) 100 UNIT/ML injection Inject 0.3 mLs (30 Units total) into the skin daily. Patient taking differently: Inject 50 Units into the skin at bedtime. 03/11/22  Yes Lorella Nimrod, MD  lovastatin (MEVACOR) 40 MG tablet Take 40 mg by mouth daily with supper.  04/28/17  Yes [provider]  metoprolol succinate (TOPROL-XL) 50 MG 24 hr tablet Take 50 mg by mouth daily. 09/01/21  Yes [provider]  potassium citrate (UROCIT-K) 10 MEQ (1080 MG) SR tablet Take 10 mEq by mouth in the morning and at bedtime.   Yes [provider]  tamsulosin (FLOMAX) 0.4 MG CAPS capsule TAKE (1) CAPSULE BY MOUTH EVERY DAY 06/03/22  Yes Stoioff, Ronda Fairly, MD  traMADol (ULTRAM) 50 MG tablet Take 50 mg by mouth every 12 (twelve) hours as needed. 04/05/21  Yes [provider]  cetirizine (ZYRTEC) 10 MG chewable tablet Chew by mouth.    [provider]  GLOBAL INJECT EASE INSULIN SYR 31G X 5/16" 0.5 ML MISC Inject into the skin. 08/16/21   [provider]  glucose blood (ONETOUCH ULTRA) test strip TEST BLOOD SUGAR 4 TIMES DAILY 10/26/18   [provider]  Lancets (ONETOUCH DELICA PLUS [id number]) [place] USE 1 LANCET 4 TIMES DAILY 05/04/19   [provider]    Inpatient Medications:   [START ON 06/13/2022] amLODipine  10 mg Oral Daily   [START ON 06/13/2022] aspirin EC  81 mg Oral Daily   [START ON 06/13/2022]  calcitRIOL  0.25 mcg Oral Daily   furosemide  60 mg Intravenous Q12H   heparin  5,000 Units Subcutaneous Q8H   hydrALAZINE  50 mg Oral TID   insulin aspart  0-20 Units Subcutaneous TID WC   insulin detemir  50 Units Subcutaneous QHS   [START ON 06/13/2022] metoprolol succinate  50 mg Oral Daily   potassium citrate  10 mEq Oral BID   pravastatin  40 mg Oral q1800   sodium chloride flush  3 mL Intravenous Q12H   [START ON 06/13/2022] tamsulosin  0.4 mg Oral Daily    sodium chloride      Allergies:  Allergies  Allergen Reactions   Codeine Nausea And Vomiting   Doxycycline    Erythromycin Rash    Social History   Socioeconomic History   Marital status: Married    Spouse name: Not on file   Number of children: Not on file   Years of education: Not on file   Highest education level: Not on file   Occupational History   Not on file  Tobacco Use   Smoking status: Never    Passive exposure: Never   Smokeless tobacco: Never  Vaping Use   Vaping Use: Never used  Substance and Sexual Activity   Alcohol use: No   Drug use: Not Currently   Sexual activity: Yes    Birth control/protection: None  Other Topics Concern   Not on file  Social History Narrative   Not on file   Social Determinants of Health   Financial Resource Strain: Not on file  Food Insecurity: No Food Insecurity (06/12/2022)   Hunger Vital Sign    Worried About Running Out of Food in the Last Year: Never true    Ran Out of Food in the Last Year: Never true  Transportation Needs: No Transportation Needs (06/12/2022)   PRAPARE - Hydrologist (Medical): No    Lack of Transportation (Non-Medical): No  Physical Activity: Not on file  Stress: Not on file  Social Connections: Not on file  Intimate Partner Violence: Not At Risk (06/12/2022)   Humiliation, Afraid, Rape, and Kick questionnaire    Fear of Current or Ex-Partner: No    Emotionally Abused: No    Physically Abused: No    Sexually Abused: No     Family History  Problem Relation Age of Onset   Emphysema Mother    COPD Mother    Heart disease Mother    Brain cancer Father      Review of Systems Positive for shortness of breath PND orthopnea Negative for: General:  chills, fever, night sweats or weight changes.  Cardiovascular: Positive for PND orthopnea negative for syncope dizziness  Dermatological skin lesions rashes Respiratory: Cough congestion Urologic: Frequent urination urination at night and hematuria Abdominal: negative for nausea, vomiting, diarrhea, bright red blood per rectum, melena, or hematemesis Neurologic: negative for visual changes, and/or hearing changes  All other systems reviewed and are otherwise negative except as noted above.  Labs: No results for input(s): "CKTOTAL", "CKMB", "TROPONINI"  in the last 72 hours. Lab Results  Component Value Date   WBC 11.8 (H) 06/12/2022   HGB 11.7 (L) 06/12/2022   HCT 36.7 (L) 06/12/2022   MCV 89.7 06/12/2022   PLT 307 06/12/2022    Recent Labs  Lab 06/12/22 1259  NA 140  K 4.3  CL 108  CO2 25  BUN 56*  CREATININE 2.90*  CALCIUM 8.9  GLUCOSE  184*   No results found for: "CHOL", "HDL", "LDLCALC", "TRIG" No results found for: "DDIMER"  Radiology/Studies:  DG Chest 2 View  Result Date: 06/12/2022 CLINICAL DATA:  Chest pain and shortness of breath since last night, slept in recliner with CPAP all night EXAM: CHEST - 2 VIEW COMPARISON:  09/19/2009 FINDINGS: Enlargement of cardiac silhouette. Mediastinal contours and pulmonary vascularity normal. Interstitial infiltrates at the mid to lower lungs question edema. Small LEFT pleural effusion. No upper lobe infiltrate or pneumothorax. Osseous structures demineralized. IMPRESSION: Enlargement of cardiac silhouette with bibasilar infiltrates question pulmonary edema. Small associated LEFT pleural effusion. Electronically Signed   By: Lavonia Dana M.D.   On: 06/12/2022 13:21   CT RENAL STONE STUDY  Result Date: 05/23/2022 CLINICAL DATA:  Flank pain. EXAM: CT ABDOMEN AND PELVIS WITHOUT CONTRAST TECHNIQUE: Multidetector CT imaging of the abdomen and pelvis was performed following the standard protocol without IV contrast. RADIATION DOSE REDUCTION: This exam was performed according to the departmental dose-optimization program which includes automated exposure control, adjustment of the mA and/or kV according to patient size and/or use of iterative reconstruction technique. COMPARISON:  CT abdomen and pelvis 02/26/2022 FINDINGS: Lower chest: There is some chronic interstitial opacities in both lung bases similar to prior. There is a small pericardial effusion, similar to prior. There is also trace left pleural effusion. Hepatobiliary: No focal liver abnormality is seen. Status post cholecystectomy. No  biliary dilatation. Pancreas: Unremarkable. No pancreatic ductal dilatation or surrounding inflammatory changes. Spleen: Normal in size without focal abnormality. Adrenals/Urinary Tract: There are 3 punctate calculi in the superior pole the right kidney measuring 3 mm each. There is a cyst in the superior pole the right kidney measuring 4 cm. There is no hydronephrosis or perinephric fluid collection. No ureteral or bladder calculi are seen. The bladder and adrenal glands are within normal limits. Stomach/Bowel: Stomach is within normal limits. No evidence of bowel wall thickening, distention, or inflammatory changes. There is diffuse colonic diverticulosis. The appendix is not visualized. Vascular/Lymphatic: Aortic atherosclerosis. No enlarged abdominal or pelvic lymph nodes. Reproductive: Prostate is unremarkable. Other: There is a small fat containing umbilical hernia. No ascites. There is some subcutaneous density just beneath the skin surface in the upper left anterior abdominal wall which is unchanged, possibly scarring. Musculoskeletal: Degenerative changes affect the spine. IMPRESSION: 1. Nonobstructing right renal calculi. No hydronephrosis or ureteral calculus. 2. Colonic diverticulosis without evidence for diverticulitis. 3. Stable small pericardial effusion. 4. Trace left pleural effusion. 5. Right Bosniak II benign renal cyst measuring 4 cm. No follow-up imaging is recommended. JACR 2018 Feb; 264-273, Management of the Incidental Renal Mass on CT, [employer] 2021; 814-848, Bosniak Classification of Cystic Renal Masses, Version 2019. Aortic Atherosclerosis (ICD10-I70.0). Electronically Signed   By: Ronney Asters M.D.   On: 05/23/2022 15:40    EKG: Normal sinus rhythm with nonspecific ST and T wave changes  Weights: Filed Weights   06/12/22 1248  Weight: (!) 147 kg     Physical Exam: Blood pressure (!) 163/74, pulse 70, temperature 98.2 F (36.8 C), temperature source Oral, resp. rate (!)  23, height '5\' 10"'$  (1.778 m), weight (!) 147 kg, SpO2 97 %. Body mass index is 46.49 kg/m. General: Well developed, well nourished, in no acute distress. Head eyes ears nose throat: Normocephalic, atraumatic, sclera non-icteric, no xanthomas, nares are without discharge. No apparent thyromegaly and/or mass  Lungs: Normal respiratory effort.  no wheezes, few basilar rales, no rhonchi.  Heart: RRR with normal S1 S2. no  murmur gallop, no rub, PMI is normal size and placement, carotid upstroke normal without bruit, jugular venous pressure is normal Abdomen: Soft, non-tender, non-distended with normoactive bowel sounds. No hepatomegaly. No rebound/guarding. No obvious abdominal masses. Abdominal aorta is normal size without bruit Extremities: 1+ edema. no cyanosis, no clubbing, no ulcers  Peripheral : 2+ bilateral upper extremity pulses, 2+ bilateral femoral pulses, 2+ bilateral dorsal pedal pulse Neuro: Alert and oriented. No facial asymmetry. No focal deficit. Moves all extremities spontaneously. Musculoskeletal: Normal muscle tone without kyphosis Psych:  Responds to questions appropriately with a normal affect.    Assessment: 81 year old male with known coronary atherosclerosis sleep apnea hypertension hyperlipidemia chronic kidney disease stage IV with acute volume overload and pulmonary edema most consistent with chronic kidney disease rather than acute cardiac abnormalities.  There is no current evidence of myocardial infarction or anginal symptoms  Plan: 1.  Continue intravenous diuresis for acute volume overload and pulmonary edema due to kidney dysfunction 2.  Further consultation with nephrology to assess need for dialysis versus continued intravenous Lasix 3.  No further cardiac diagnostics necessary at this time due to recent echocardiogram showing normal LV systolic function and modest coronary artery disease without evidence of critical coronary artery atherosclerosis requiring further  intervention 4.  Continuation of antihypertensive medication management as before without change 5.  High intensity cholesterol therapy for coronary atherosclerosis 6.  If diuresing well with no evidence of further significant issues would recommend discharged home when nephrology has determined best approach to kidney dysfunction  Signed, Corey Skains M.D. [facility] 06/12/2022, 4:22 PM

## 2022-06-12 NOTE — ED Triage Notes (Signed)
Patient has CP and SOB last night and slept in recliner with CPAP all night. Patient took his ASA '324mg'$  and lasix this am.  Denies CP at this time but is extremely short of breath.  Patient has had dialysis in the past with hospitalization but is not on continuous dialysis. Patient has ESRD.  Hx CHF as well.

## 2022-06-12 NOTE — Assessment & Plan Note (Addendum)
EF 55 to 60%.  Case discussed with nephrology will go back to Lasix 80 mg IV twice daily to try to diurese more fluid.  The patient with increased weight today with oral Lasix and 1 dose of IV Lasix yesterday.  Fluid restrict. Continue Toprol

## 2022-06-12 NOTE — Progress Notes (Signed)
       CROSS COVER NOTE  NAME: James Clarke MRN: [id number] DOB : March 20, 1941    Time of Service   2050 pm  HPI/Events of Note   Patient reported several hypoglycemics events in the morning with his current insulin regimen and was suppose to have a follow up appointment with his PCP regarding this issue  Assessment and  Interventions   Assessment: CBG 167 Plan: Hold long acting insulin for tonight Check cbg [place] NP [facility]

## 2022-06-12 NOTE — Assessment & Plan Note (Addendum)
Continue amlodipine, metoprolol and hydralazine.

## 2022-06-12 NOTE — Assessment & Plan Note (Addendum)
Last BMI 44.72

## 2022-06-12 NOTE — Assessment & Plan Note (Addendum)
Chronic kidney disease stage IV.  Decreased dose of long-acting insulin with elevation of creatinine to 3.58.  Add 5 units of short acting insulin plus sliding scale.

## 2022-06-13 DIAGNOSIS — I5033 Acute on chronic diastolic (congestive) heart failure: Secondary | ICD-10-CM | POA: Diagnosis not present

## 2022-06-13 DIAGNOSIS — N184 Chronic kidney disease, stage 4 (severe): Secondary | ICD-10-CM | POA: Diagnosis not present

## 2022-06-13 DIAGNOSIS — E1169 Type 2 diabetes mellitus with other specified complication: Secondary | ICD-10-CM | POA: Diagnosis not present

## 2022-06-13 DIAGNOSIS — R079 Chest pain, unspecified: Secondary | ICD-10-CM

## 2022-06-13 LAB — BASIC METABOLIC PANEL
Anion gap: 6 (ref 5–15)
BUN: 57 mg/dL — ABNORMAL HIGH (ref 8–23)
CO2: 27 mmol/L (ref 22–32)
Calcium: 8.5 mg/dL — ABNORMAL LOW (ref 8.9–10.3)
Chloride: 107 mmol/L (ref 98–111)
Creatinine, Ser: 3.02 mg/dL — ABNORMAL HIGH (ref 0.61–1.24)
GFR, Estimated: 20 mL/min — ABNORMAL LOW (ref >60)
Glucose, Bld: 178 mg/dL — ABNORMAL HIGH (ref 70–99)
Potassium: 3.9 mmol/L (ref 3.5–5.1)
Sodium: 140 mmol/L (ref 135–145)

## 2022-06-13 LAB — CBC
HCT: 33.4 % — ABNORMAL LOW (ref 39.0–52.0)
Hemoglobin: 10.7 g/dL — ABNORMAL LOW (ref 13.0–17.0)
MCH: 28.5 pg (ref 26.0–34.0)
MCHC: 32 g/dL (ref 30.0–36.0)
MCV: 89.1 fL (ref 80.0–100.0)
Platelets: 265 10*3/uL (ref 150–400)
RBC: 3.75 MIL/uL — ABNORMAL LOW (ref 4.22–5.81)
RDW: 13.8 % (ref 11.5–15.5)
WBC: 10.8 10*3/uL — ABNORMAL HIGH (ref 4.0–10.5)
nRBC: 0 % (ref 0.0–0.2)

## 2022-06-13 LAB — GLUCOSE, CAPILLARY
Glucose-Capillary: 148 mg/dL — ABNORMAL HIGH (ref 70–99)
Glucose-Capillary: 232 mg/dL — ABNORMAL HIGH (ref 70–99)
Glucose-Capillary: 263 mg/dL — ABNORMAL HIGH (ref 70–99)
Glucose-Capillary: 287 mg/dL — ABNORMAL HIGH (ref 70–99)
Glucose-Capillary: 289 mg/dL — ABNORMAL HIGH (ref 70–99)

## 2022-06-13 LAB — TROPONIN I (HIGH SENSITIVITY)
Troponin I (High Sensitivity): 82 ng/L — ABNORMAL HIGH (ref <18)
Troponin I (High Sensitivity): 75 ng/L — ABNORMAL HIGH (ref <18)

## 2022-06-13 LAB — MAGNESIUM: Magnesium: 2.1 mg/dL (ref 1.7–2.4)

## 2022-06-13 MED ORDER — SIMETHICONE 80 MG PO CHEW
160.0000 mg | CHEWABLE_TABLET | Freq: Once | ORAL | Status: AC
  Administered 2022-06-13: 160 mg via ORAL
  Filled 2022-06-13: qty 2

## 2022-06-13 MED ORDER — FUROSEMIDE 10 MG/ML IJ SOLN: 80.0000 mg | Freq: Two times a day (BID) | INTRAMUSCULAR | Status: DC

## 2022-06-13 MED ORDER — ISOSORBIDE MONONITRATE ER 30 MG PO TB24
30.0000 mg | ORAL_TABLET | Freq: Every day | ORAL | Status: DC
  Administered 2022-06-13 – 2022-06-20 (×8): 30 mg via ORAL
  Filled 2022-06-13 (×8): qty 1

## 2022-06-13 MED ORDER — NITROGLYCERIN 0.4 MG SL SUBL
0.4000 mg | SUBLINGUAL_TABLET | SUBLINGUAL | Status: DC | PRN
  Administered 2022-06-13 – 2022-06-16 (×4): 0.4 mg via SUBLINGUAL
  Filled 2022-06-13 (×2): qty 1

## 2022-06-13 MED ORDER — FUROSEMIDE 10 MG/ML IJ SOLN
80.0000 mg | Freq: Two times a day (BID) | INTRAMUSCULAR | Status: DC
  Administered 2022-06-13 – 2022-06-14 (×4): 80 mg via INTRAVENOUS
  Filled 2022-06-13 (×4): qty 8

## 2022-06-13 NOTE — Progress Notes (Signed)
[facility] Encounter Note  Patient: James Clarke / Admit Date: 06/12/2022 / Date of Encounter: 06/13/2022, 5:26 AM   Subjective: Patient had episode of chest discomfort last night not particularly relieved by nitroglycerin.  Patient's oxygenation may have helped with improvements of symptoms.  Troponin levels have been 61/78/82 more consistent with volume overload and pulmonary edema rather than acute coronary syndrome.  EKG was unchanged from admission with nonspecific ST changes.  The patient has had 2 L of urine output which has improved his current condition although primary issue appears to be chronic kidney disease with stage IV.  Echocardiogram earlier this year showed normal LV systolic function and no evidence of significant valvular heart disease  Cardiac catheterization showed moderate atherosclerosis of 3 vessels without evidence of need for intervention  Review of Systems: Positive for: Shortness of breath and chest pain Negative for: Vision change, hearing change, syncope, dizziness, nausea, vomiting,diarrhea, bloody stool, stomach pain, cough, congestion, diaphoresis, urinary frequency, urinary pain,skin lesions, skin rashes Others previously listed  Objective: Telemetry: Normal sinus rhythm Physical Exam: Blood pressure (!) 143/67, pulse 84, temperature 98.8 F (37.1 C), resp. rate 18, height '5\' 10"'$  (1.778 m), weight (!) 143.3 kg, SpO2 100 %. Body mass index is 45.33 kg/m. General: Well developed, well nourished, in no acute distress. Head: Normocephalic, atraumatic, sclera non-icteric, no xanthomas, nares are without discharge. Neck: No apparent masses Lungs: Normal respirations with no wheezes, no rhonchi, basilar rales , no crackles   Heart: Regular rate and rhythm, normal S1 S2, no murmur, no rub, no gallop, PMI is normal size and placement, carotid upstroke normal without bruit, jugular venous pressure normal Abdomen: Soft, non-tender,  non-distended with normoactive bowel sounds. No hepatosplenomegaly. Abdominal aorta is normal size without bruit Extremities: 1+'s edema, no clubbing, no cyanosis, no ulcers,  Peripheral: 2+ radial, 2+ femoral, 2+ dorsal pedal pulses Neuro: Alert and oriented. Moves all extremities spontaneously. Psych:  Responds to questions appropriately with a normal affect.   Intake/Output Summary (Last 24 hours) at 06/13/2022 0526 Last data filed at 06/12/2022 2230 Gross per 24 hour  Intake --  Output 1900 ml  Net -1900 ml    Inpatient Medications:   amLODipine  10 mg Oral Daily   aspirin EC  81 mg Oral Daily   calcitRIOL  0.25 mcg Oral Daily   furosemide  60 mg Intravenous Q12H   heparin  5,000 Units Subcutaneous Q8H   hydrALAZINE  50 mg Oral TID   insulin aspart  0-20 Units Subcutaneous TID WC   insulin detemir  50 Units Subcutaneous QHS   isosorbide mononitrate  30 mg Oral Daily   metoprolol succinate  50 mg Oral Daily   potassium citrate  10 mEq Oral BID   pravastatin  40 mg Oral q1800   sodium chloride flush  3 mL Intravenous Q12H   tamsulosin  0.4 mg Oral Daily   Infusions:   sodium chloride      Labs: Recent Labs    06/12/22 1259 06/13/22 0025 06/13/22 0240  NA 140  --  140  K 4.3  --  3.9  CL 108  --  107  CO2 25  --  27  GLUCOSE 184*  --  178*  BUN 56*  --  57*  CREATININE 2.90*  --  3.02*  CALCIUM 8.9  --  8.5*  MG  --  2.1  --    No results for input(s): "AST", "ALT", "ALKPHOS", "BILITOT", "PROT", "ALBUMIN" in the  last 72 hours. Recent Labs    06/12/22 1259 06/13/22 0240  WBC 11.8* 10.8*  HGB 11.7* 10.7*  HCT 36.7* 33.4*  MCV 89.7 89.1  PLT 307 265   No results for input(s): "CKTOTAL", "CKMB", "TROPONINI" in the last 72 hours. Invalid input(s): "POCBNP" No results for input(s): "HGBA1C" in the last 72 hours.   Weights: Filed Weights   06/12/22 1248 06/12/22 2004  Weight: (!) 147 kg (!) 143.3 kg     Radiology/Studies:  CT CHEST WO  CONTRAST  Result Date: 06/12/2022 CLINICAL DATA:  Chest pain and shortness of breath. EXAM: CT CHEST WITHOUT CONTRAST TECHNIQUE: Multidetector CT imaging of the chest was performed following the standard protocol without IV contrast. RADIATION DOSE REDUCTION: This exam was performed according to the departmental dose-optimization program which includes automated exposure control, adjustment of the mA and/or kV according to patient size and/or use of iterative reconstruction technique. COMPARISON:  CT scan from 2015. FINDINGS: Cardiovascular: The heart is normal in size for age. Small pericardial effusion noted. The aorta is normal in caliber. Scattered atherosclerotic calcifications. Extensive three-vessel coronary artery calcifications. Mediastinum/Nodes: Scattered borderline mediastinal and hilar lymph nodes, likely reactive. The esophagus is grossly normal. Lungs/Pleura: Small bilateral pleural effusions with overlying atelectasis. Changes of interstitial lung disease with peripheral subpleural reticulations, thickening of the interlobular septi, faint nodularity and extensive lower lobe predominant parenchymal calcifications. This could be due to chronic inflammation, previous infection, aspiration and less likely pulmonary alveolar microlithiasis. Patchy nodular ground-glass opacities in the right lower lobe may suggest a more acute inflammatory process and aspiration or bronchopneumonia are possibilities. Upper Abdomen: No significant upper abdominal findings. No hepatic or adrenal gland lesions. No upper abdominal adenopathy. Scattered vascular calcifications. Musculoskeletal: No chest wall mass, supraclavicular or axillary adenopathy. The bony thorax is intact. IMPRESSION: 1. Changes of interstitial lung disease as detailed above. 2. Stable lower lobe predominant parenchymal lung calcifications as above. 3. Patchy nodular ground-glass opacities in the right lower lobe may suggest a more acute inflammatory  process and aspiration or bronchopneumonia are possibilities. 4. Small bilateral pleural effusions with overlying atelectasis. 5. Small pericardial effusion. 6. Extensive three-vessel coronary artery calcifications. Electronically Signed   By: Marijo Sanes M.D.   On: 06/12/2022 16:42   DG Chest 2 View  Result Date: 06/12/2022 CLINICAL DATA:  Chest pain and shortness of breath since last night, slept in recliner with CPAP all night EXAM: CHEST - 2 VIEW COMPARISON:  09/19/2009 FINDINGS: Enlargement of cardiac silhouette. Mediastinal contours and pulmonary vascularity normal. Interstitial infiltrates at the mid to lower lungs question edema. Small LEFT pleural effusion. No upper lobe infiltrate or pneumothorax. Osseous structures demineralized. IMPRESSION: Enlargement of cardiac silhouette with bibasilar infiltrates question pulmonary edema. Small associated LEFT pleural effusion. Electronically Signed   By: Lavonia Dana M.D.   On: 06/12/2022 13:21   CT RENAL STONE STUDY  Result Date: 05/23/2022 CLINICAL DATA:  Flank pain. EXAM: CT ABDOMEN AND PELVIS WITHOUT CONTRAST TECHNIQUE: Multidetector CT imaging of the abdomen and pelvis was performed following the standard protocol without IV contrast. RADIATION DOSE REDUCTION: This exam was performed according to the departmental dose-optimization program which includes automated exposure control, adjustment of the mA and/or kV according to patient size and/or use of iterative reconstruction technique. COMPARISON:  CT abdomen and pelvis 02/26/2022 FINDINGS: Lower chest: There is some chronic interstitial opacities in both lung bases similar to prior. There is a small pericardial effusion, similar to prior. There is also trace left pleural effusion. Hepatobiliary:  No focal liver abnormality is seen. Status post cholecystectomy. No biliary dilatation. Pancreas: Unremarkable. No pancreatic ductal dilatation or surrounding inflammatory changes. Spleen: Normal in size  without focal abnormality. Adrenals/Urinary Tract: There are 3 punctate calculi in the superior pole the right kidney measuring 3 mm each. There is a cyst in the superior pole the right kidney measuring 4 cm. There is no hydronephrosis or perinephric fluid collection. No ureteral or bladder calculi are seen. The bladder and adrenal glands are within normal limits. Stomach/Bowel: Stomach is within normal limits. No evidence of bowel wall thickening, distention, or inflammatory changes. There is diffuse colonic diverticulosis. The appendix is not visualized. Vascular/Lymphatic: Aortic atherosclerosis. No enlarged abdominal or pelvic lymph nodes. Reproductive: Prostate is unremarkable. Other: There is a small fat containing umbilical hernia. No ascites. There is some subcutaneous density just beneath the skin surface in the upper left anterior abdominal wall which is unchanged, possibly scarring. Musculoskeletal: Degenerative changes affect the spine. IMPRESSION: 1. Nonobstructing right renal calculi. No hydronephrosis or ureteral calculus. 2. Colonic diverticulosis without evidence for diverticulitis. 3. Stable small pericardial effusion. 4. Trace left pleural effusion. 5. Right Bosniak II benign renal cyst measuring 4 cm. No follow-up imaging is recommended. JACR 2018 Feb; 264-273, Management of the Incidental Renal Mass on CT, [employer] 2021; 814-848, Bosniak Classification of Cystic Renal Masses, Version 2019. Aortic Atherosclerosis (ICD10-I70.0). Electronically Signed   By: Ronney Asters M.D.   On: 05/23/2022 15:40     Assessment and Recommendation  81 y.o. male with known sleep apnea moderate atherosclerosis of coronary arteries and chronic kidney disease stage IV with progression volume overload causing pulmonary edema hypoxia shortness of breath and chest pain without evidence of acute coronary syndrome 1.  Continue intravenous Lasix today for further urine output and improvements of pulmonary edema and  lower extremity edema 2.  Nephrology consultation for further evaluation and treatment options kidney dysfunction for low short and long-term treatment of volume overload 3.  Continue troponin evaluation for chest pain but adding isosorbide as well 4.  No further cardiac diagnostic center intervention at this time until further evaluation from nephrology 5.  Continuation of antihypertensive medication management and high intensity cholesterol therapy as before  Signed, Serafina Royals M.D. FACC

## 2022-06-13 NOTE — Progress Notes (Signed)
PROGRESS NOTE    James Clarke  CHE:[id number] DOB: Feb 24, 1941 DOA: 06/12/2022 PCP: Sofie Hartigan, MD   Assessment & Plan:   Principal Problem:   Acute on chronic diastolic CHF (congestive heart failure) ([place]) Active Problems:   Type 2 diabetes mellitus with renal manifestations ([place])   Hypertension   Sleep apnea   Morbid obesity due to excess calories ([facility])   Chest pain at rest  Assessment and Plan: Acute on chronic diastolic CHF: continue on IV lasix. Monitor I/Os. Worsening shortness of breath associated with orthopnea and bilateral lower extremity swelling on admission. Echo from 08/23 showed an EF of 55 to 60% with grade 2 diastolic dysfunction. Continue on metoprolol, statin, aspirin and started on imdur as per cardio. Cardio following and recs apprec    DM2: likely poorly controlled. Continue on levemir, SSI w/ accuchecks   CKDIV: Cr is trending up. Nephro consulted   HTN: continue on amlodipine, metoprolol, hydralazine   OSA: CPAP qhs    Morbid obesity: BMI 45.3. Complicates overall care & prognosis    Chest pain: at rest. W/ minimally elevated troponins, likely secondary to demand ischemia. Started on imdur as per cardio. Continue on tele       DVT prophylaxis:  heparin  Code Status: full Family Communication: discussed pt's care w/ pt's family at bedside and answered their questions  Disposition Plan: unclear  Level of care: Progressive  Status is: Inpatient Remains inpatient appropriate because: severity of illness   Consultants:  Nephro   Procedures:   Antimicrobials:    Subjective: Pt c/o shortness of breath   Objective: Vitals:   06/13/22 0041 06/13/22 0052 06/13/22 0337 06/13/22 0748  BP: 120/64 115/64 (!) 143/67 (!) 140/65  Pulse:   84 79  Resp:   18 18  Temp:   98.8 F (37.1 C) 98.2 F (36.8 C)  TempSrc:      SpO2:   100% 99%  Weight:      Height:        Intake/Output Summary (Last 24 hours) at 06/13/2022 0802 Last  data filed at 06/13/2022 0748 Gross per 24 hour  Intake 240 ml  Output 2600 ml  Net -2360 ml   Filed Weights   06/12/22 1248 06/12/22 2004  Weight: (!) 147 kg (!) 143.3 kg    Examination:  General exam: Appears calm but uncomfortable  Respiratory system: decreased breath sounds b/l  Cardiovascular system: S1 & S2+. No rubs, gallops or clicks.  Gastrointestinal system: Abdomen is obese, soft and nontender.  Normal bowel sounds heard. Central nervous system: Alert and oriented. Moves all extremities  Psychiatry: Judgement and insight appear normal. Flat mood and affect    Data Reviewed: I have personally reviewed following labs and imaging studies  CBC: Recent Labs  Lab 06/12/22 1259 06/13/22 0240  WBC 11.8* 10.8*  HGB 11.7* 10.7*  HCT 36.7* 33.4*  MCV 89.7 89.1  PLT 307 536   Basic Metabolic Panel: Recent Labs  Lab 06/12/22 1259 06/13/22 0025 06/13/22 0240  NA 140  --  140  K 4.3  --  3.9  CL 108  --  107  CO2 25  --  27  GLUCOSE 184*  --  178*  BUN 56*  --  57*  CREATININE 2.90*  --  3.02*  CALCIUM 8.9  --  8.5*  MG  --  2.1  --    GFR: Estimated Creatinine Clearance: 27.4 mL/min (A) (by C-G formula based on SCr of 3.02  mg/dL (H)). Liver Function Tests: No results for input(s): "AST", "ALT", "ALKPHOS", "BILITOT", "PROT", "ALBUMIN" in the last 168 hours. No results for input(s): "LIPASE", "AMYLASE" in the last 168 hours. No results for input(s): "AMMONIA" in the last 168 hours. Coagulation Profile: No results for input(s): "INR", "PROTIME" in the last 168 hours. Cardiac Enzymes: No results for input(s): "CKTOTAL", "CKMB", "CKMBINDEX", "TROPONINI" in the last 168 hours. BNP (last 3 results) No results for input(s): "PROBNP" in the last 8760 hours. HbA1C: No results for input(s): "HGBA1C" in the last 72 hours. CBG: Recent Labs  Lab 06/12/22 1718 06/12/22 2018 06/13/22 0021 06/13/22 0718  GLUCAP 194* 167* 148* 232*   Lipid Profile: No results for  input(s): "CHOL", "HDL", "LDLCALC", "TRIG", "CHOLHDL", "LDLDIRECT" in the last 72 hours. Thyroid Function Tests: No results for input(s): "TSH", "T4TOTAL", "FREET4", "T3FREE", "THYROIDAB" in the last 72 hours. Anemia Panel: No results for input(s): "VITAMINB12", "FOLATE", "FERRITIN", "TIBC", "IRON", "RETICCTPCT" in the last 72 hours. Sepsis Labs: No results for input(s): "PROCALCITON", "LATICACIDVEN" in the last 168 hours.  Recent Results (from the past 240 hour(s))  Resp Panel by RT-PCR (Flu A&B, Covid) Anterior Nasal Swab     Status: None   Collection Time: 06/12/22  2:20 PM   Specimen: Anterior Nasal Swab  Result Value Ref Range Status   SARS Coronavirus 2 by RT PCR NEGATIVE NEGATIVE Final    Comment: (NOTE) SARS-CoV-2 target nucleic acids are NOT DETECTED.  The SARS-CoV-2 RNA is generally detectable in upper respiratory specimens during the acute phase of infection. The lowest concentration of SARS-CoV-2 viral copies this assay can detect is 138 copies/mL. A negative result does not preclude SARS-Cov-2 infection and should not be used as the sole basis for treatment or other patient management decisions. A negative result may occur with  improper specimen collection/handling, submission of specimen other than nasopharyngeal swab, presence of viral mutation(s) within the areas targeted by this assay, and inadequate number of viral copies(<138 copies/mL). A negative result must be combined with clinical observations, patient history, and epidemiological information. The expected result is Negative.  Fact Sheet for Patients:  EntrepreneurPulse.com.au  Fact Sheet for Healthcare Providers:  IncredibleEmployment.be  This test is no t yet approved or cleared by the [place] FDA and  has been authorized for detection and/or diagnosis of SARS-CoV-2 by FDA under an Emergency Use Authorization (EUA). This EUA will remain  in effect (meaning this  test can be used) for the duration of the COVID-19 declaration under Section 564(b)(1) of the Act, 21 U.S.C.section 360bbb-3(b)(1), unless the authorization is terminated  or revoked sooner.       Influenza A by PCR NEGATIVE NEGATIVE Final   Influenza B by PCR NEGATIVE NEGATIVE Final    Comment: (NOTE) The Xpert Xpress SARS-CoV-2/FLU/RSV plus assay is intended as an aid in the diagnosis of influenza from Nasopharyngeal swab specimens and should not be used as a sole basis for treatment. Nasal washings and aspirates are unacceptable for Xpert Xpress SARS-CoV-2/FLU/RSV testing.  Fact Sheet for Patients: EntrepreneurPulse.com.au  Fact Sheet for Healthcare Providers: IncredibleEmployment.be  This test is not yet approved or cleared by the [place] FDA and has been authorized for detection and/or diagnosis of SARS-CoV-2 by FDA under an Emergency Use Authorization (EUA). This EUA will remain in effect (meaning this test can be used) for the duration of the COVID-19 declaration under Section 564(b)(1) of the Act, 21 U.S.C. section 360bbb-3(b)(1), unless the authorization is terminated or revoked.  Performed at [facility]  Lab, [address], [place], [place] [address]          Radiology Studies: CT CHEST WO CONTRAST  Result Date: 06/12/2022 CLINICAL DATA:  Chest pain and shortness of breath. EXAM: CT CHEST WITHOUT CONTRAST TECHNIQUE: Multidetector CT imaging of the chest was performed following the standard protocol without IV contrast. RADIATION DOSE REDUCTION: This exam was performed according to the departmental dose-optimization program which includes automated exposure control, adjustment of the mA and/or kV according to patient size and/or use of iterative reconstruction technique. COMPARISON:  CT scan from 2015. FINDINGS: Cardiovascular: The heart is normal in size for age. Small pericardial effusion noted. The aorta is normal  in caliber. Scattered atherosclerotic calcifications. Extensive three-vessel coronary artery calcifications. Mediastinum/Nodes: Scattered borderline mediastinal and hilar lymph nodes, likely reactive. The esophagus is grossly normal. Lungs/Pleura: Small bilateral pleural effusions with overlying atelectasis. Changes of interstitial lung disease with peripheral subpleural reticulations, thickening of the interlobular septi, faint nodularity and extensive lower lobe predominant parenchymal calcifications. This could be due to chronic inflammation, previous infection, aspiration and less likely pulmonary alveolar microlithiasis. Patchy nodular ground-glass opacities in the right lower lobe may suggest a more acute inflammatory process and aspiration or bronchopneumonia are possibilities. Upper Abdomen: No significant upper abdominal findings. No hepatic or adrenal gland lesions. No upper abdominal adenopathy. Scattered vascular calcifications. Musculoskeletal: No chest wall mass, supraclavicular or axillary adenopathy. The bony thorax is intact. IMPRESSION: 1. Changes of interstitial lung disease as detailed above. 2. Stable lower lobe predominant parenchymal lung calcifications as above. 3. Patchy nodular ground-glass opacities in the right lower lobe may suggest a more acute inflammatory process and aspiration or bronchopneumonia are possibilities. 4. Small bilateral pleural effusions with overlying atelectasis. 5. Small pericardial effusion. 6. Extensive three-vessel coronary artery calcifications. Electronically Signed   By: Marijo Sanes M.D.   On: 06/12/2022 16:42   DG Chest 2 View  Result Date: 06/12/2022 CLINICAL DATA:  Chest pain and shortness of breath since last night, slept in recliner with CPAP all night EXAM: CHEST - 2 VIEW COMPARISON:  09/19/2009 FINDINGS: Enlargement of cardiac silhouette. Mediastinal contours and pulmonary vascularity normal. Interstitial infiltrates at the mid to lower lungs  question edema. Small LEFT pleural effusion. No upper lobe infiltrate or pneumothorax. Osseous structures demineralized. IMPRESSION: Enlargement of cardiac silhouette with bibasilar infiltrates question pulmonary edema. Small associated LEFT pleural effusion. Electronically Signed   By: Lavonia Dana M.D.   On: 06/12/2022 13:21        Scheduled Meds:  amLODipine  10 mg Oral Daily   aspirin EC  81 mg Oral Daily   calcitRIOL  0.25 mcg Oral Daily   furosemide  60 mg Intravenous Q12H   heparin  5,000 Units Subcutaneous Q8H   hydrALAZINE  50 mg Oral TID   insulin aspart  0-20 Units Subcutaneous TID WC   insulin detemir  50 Units Subcutaneous QHS   isosorbide mononitrate  30 mg Oral Daily   metoprolol succinate  50 mg Oral Daily   potassium citrate  10 mEq Oral BID   pravastatin  40 mg Oral q1800   sodium chloride flush  3 mL Intravenous Q12H   tamsulosin  0.4 mg Oral Daily   Continuous Infusions:  sodium chloride       LOS: 1 day    Time spent: 35 mins     Wyvonnia Dusky, MD [facility] Pager [phone]  If 7PM-7AM, please contact night-coverage [email or web address] 06/13/2022, 8:02 AM

## 2022-06-13 NOTE — Progress Notes (Signed)
James Clarke  MRN: [id number]  DOB/AGE: 09/01/1940 81 y.o.  Primary Care Physician:Feldpausch, Chrissie Noa, MD  Admit date: 06/12/2022  Chief Complaint:  Chief Complaint  Patient presents with   Shortness of Breath    S-Pt presented on  06/12/2022 with  Chief Complaint  Patient presents with   Shortness of Breath  .  Patient is 81 year old Caucasian male with a past medical history diabetes mellitus type 2, CKD stage IV, morbid obesity, hypertension, obstructive sleep apnea, nephrolithiasis, chronic diastolic CHF who came to the ER with chief complaint of shortness of breath.  History of present illness date back to past few days ago when patient started having shortness of breath that was getting progressively worse so patient came to the ER. Patient did complain of orthopnea as well as the night before admission patient was sleeping on recliner instead of his bed. Upon evaluation in the ER patient was found to be in fluid overload and was admitted. Patient was started on IV Lasix and nephrology was consulted. Patient is well-known to nephrology practice. Patient follows up with Dr. Holley Raring as an outpatient.  Patient last visit was on April 23, 2022 with a plan to follow-up in a month. Patient was seen today on second floor Patient main concern continues to be shortness of breath. Patient next major concern was that he had chest pressure feeling last night and was given nitroglycerin. Patient's son was present in the room.  Patient's son had questions about renal placement therapy.  I answered patient and his son's questions to the best of my ability   Medications  amLODipine  10 mg Oral Daily   aspirin EC  81 mg Oral Daily   calcitRIOL  0.25 mcg Oral Daily   furosemide  60 mg Intravenous Q12H   heparin  5,000 Units Subcutaneous Q8H   hydrALAZINE  50 mg Oral TID   insulin aspart  0-20 Units Subcutaneous TID WC   insulin detemir  50 Units Subcutaneous QHS   isosorbide  mononitrate  30 mg Oral Daily   metoprolol succinate  50 mg Oral Daily   potassium citrate  10 mEq Oral BID   pravastatin  40 mg Oral q1800   sodium chloride flush  3 mL Intravenous Q12H   tamsulosin  0.4 mg Oral Daily         FFM:BWGYK from the symptoms mentioned above,there are no other symptoms referable to all systems reviewed.  Physical Exam: Vital signs in last 24 hours: Temp:  [97.4 F (36.3 C)-98.8 F (37.1 C)] 98.8 F (37.1 C) (11/23 0337) Pulse Rate:  [67-84] 84 (11/23 0337) Resp:  [14-27] 18 (11/23 0337) BP: (115-173)/(63-88) 143/67 (11/23 0337) SpO2:  [91 %-100 %] 100 % (11/23 0337) Weight:  [143.3 kg-147 kg] 143.3 kg (11/22 2004) Weight change:     Intake/Output from previous day: 11/22 0701 - 11/23 0700 In: -  Out: 1900 [Urine:1900] No intake/output data recorded.   Physical Exam:  General- pt is awake,alert, oriented to time place and person  HEENT-head is atraumatic normocephalic sclera is anicteric  Resp- No acute REsp distress, Decreased breath sounds at bases  CVS- S1S2 regular in rate and rhythm  GIT- BS+, soft, Non tender , Non distended  EXT- trace LE Edema,  No Cyanosis    Lab Results:  CBC  Recent Labs    06/12/22 1259 06/13/22 0240  WBC 11.8* 10.8*  HGB 11.7* 10.7*  HCT 36.7* 33.4*  PLT 307 265    BMET  Recent Labs    06/12/22 1259 06/13/22 0240  NA 140 140  K 4.3 3.9  CL 108 107  CO2 25 27  GLUCOSE 184* 178*  BUN 56* 57*  CREATININE 2.90* 3.02*  CALCIUM 8.9 8.5*      Most recent Creatinine trend  Lab Results  Component Value Date   CREATININE 3.02 (H) 06/13/2022   CREATININE 2.90 (H) 06/12/2022   CREATININE 2.99 (H) 04/10/2022      MICRO   Recent Results (from the past 240 hour(s))  Resp Panel by RT-PCR (Flu A&B, Covid) Anterior Nasal Swab     Status: None   Collection Time: 06/12/22  2:20 PM   Specimen: Anterior Nasal Swab  Result Value Ref Range Status   SARS Coronavirus 2 by RT PCR  NEGATIVE NEGATIVE Final    Comment: (NOTE) SARS-CoV-2 target nucleic acids are NOT DETECTED.  The SARS-CoV-2 RNA is generally detectable in upper respiratory specimens during the acute phase of infection. The lowest concentration of SARS-CoV-2 viral copies this assay can detect is 138 copies/mL. A negative result does not preclude SARS-Cov-2 infection and should not be used as the sole basis for treatment or other patient management decisions. A negative result may occur with  improper specimen collection/handling, submission of specimen other than nasopharyngeal swab, presence of viral mutation(s) within the areas targeted by this assay, and inadequate number of viral copies(<138 copies/mL). A negative result must be combined with clinical observations, patient history, and epidemiological information. The expected result is Negative.  Fact Sheet for Patients:  EntrepreneurPulse.com.au  Fact Sheet for Healthcare Providers:  IncredibleEmployment.be  This test is no t yet approved or cleared by the [place] FDA and  has been authorized for detection and/or diagnosis of SARS-CoV-2 by FDA under an Emergency Use Authorization (EUA). This EUA will remain  in effect (meaning this test can be used) for the duration of the COVID-19 declaration under Section 564(b)(1) of the Act, 21 U.S.C.section 360bbb-3(b)(1), unless the authorization is terminated  or revoked sooner.       Influenza A by PCR NEGATIVE NEGATIVE Final   Influenza B by PCR NEGATIVE NEGATIVE Final    Comment: (NOTE) The Xpert Xpress SARS-CoV-2/FLU/RSV plus assay is intended as an aid in the diagnosis of influenza from Nasopharyngeal swab specimens and should not be used as a sole basis for treatment. Nasal washings and aspirates are unacceptable for Xpert Xpress SARS-CoV-2/FLU/RSV testing.  Fact Sheet for Patients: EntrepreneurPulse.com.au  Fact Sheet for  Healthcare Providers: IncredibleEmployment.be  This test is not yet approved or cleared by the [place] FDA and has been authorized for detection and/or diagnosis of SARS-CoV-2 by FDA under an Emergency Use Authorization (EUA). This EUA will remain in effect (meaning this test can be used) for the duration of the COVID-19 declaration under Section 564(b)(1) of the Act, 21 U.S.C. section 360bbb-3(b)(1), unless the authorization is terminated or revoked.  Performed at [facility], [address] [place], [place] [address]          Impression:  Patient is 81 year old Caucasian male with a past medical history of diabetes mellitus, proteinuria, CKD stage IV, morbid obesity, hypertension, obstructive sleep apnea, nephrolithiasis, chronic diastolic CHF who is admitted with Acute on chronic diastolic CHF  1)Renal   CKD stage IV. Patient has CKD stage IV secondary to diabetes mellitus. Patient has CKD stage V going back to 2014 Patient has had elevated microalbumin creatinine ratio of 504 going back to 2015 Creatinine trend 2023 2.9--3.2  Patient had a peak creatinine of 5.9 during August admission 2022 2.4--3.0 2021 2.4--2.8 2015 2.1  Patient did require renal placement therapy during his admission in August 2023.  Patient creatinine is currently at his baseline. Patient responding well to diuretics No need for renal placement therapy today   2)HTN    Blood pressure is stable    3)Anemia of chronic disease     Latest Ref Rng & Units 06/13/2022    2:40 AM 06/12/2022   12:59 PM 03/09/2022    4:17 AM  CBC  WBC 4.0 - 10.5 K/uL 10.8  11.8  9.2   Hemoglobin 13.0 - 17.0 g/dL 10.7  11.7  10.1   Hematocrit 39.0 - 52.0 % 33.4  36.7  32.3   Platelets 150 - 400 K/uL 265  307  247        HGb at goal (9--11)   4) Secondary hyperparathyroidism -CKD Mineral-Bone Disorder    Lab Results  Component Value Date   CALCIUM 8.5 (L)  06/13/2022   CAION 1.19 09/11/2020   PHOS 4.3 (H) 04/10/2022    Secondary Hyperparathyroidism present . Patient intact PTH as an outpatient was 148 Patient is on calcitriol Phosphorus at goal.   5) nephrotic range proteinuria  Patient has had nephrotic range proteinuria going back to December 2022 Patient currently not on any RAAS blockade secondary to his low GFR   6) Electrolytes      Latest Ref Rng & Units 06/13/2022    2:40 AM 06/12/2022   12:59 PM 04/10/2022   12:00 AM  BMP  Glucose 70 - 99 mg/dL 178  184    BUN 8 - 23 mg/dL 57  56    Creatinine 0.61 - 1.24 mg/dL 3.02  2.90  2.99   Sodium 135 - 145 mmol/L 140  140  141   Potassium 3.5 - 5.1 mmol/L 3.9  4.3  4.7   Chloride 98 - 111 mmol/L 107  108  104   CO2 22 - 32 mmol/L '27  25  24   '$ Calcium 8.9 - 10.3 mg/dL 8.5  8.9  9.1      Sodium Normonatremic   Potassium Normokalemic    7)Acid base   Co2 at goal  8) NephroLithiasis Patient has history of nephrolithiasis Patient continues to be on potassium citrate  9)Acute on chronic diastolic CHF/acute Pulmonary edema Patient was admitted with fluid overload Patient is on Lasix 60 mg IV twice daily Patient is currently 2.6 L negative Will increase the dose of Lasix Will follow    Plan:   Will increase the dose of diuretics. I had extensive discussion the patient regarding need/risk/benefit of renal placement therapy. I discussed with the patient different modalities of renal placement therapy.  I discussed the patient multiple modalities of access. I answered patient's queries to the best of my ability     Dandrea Medders s [facility] 06/13/2022, 7:29 AM

## 2022-06-14 ENCOUNTER — Inpatient Hospital Stay: Payer: PPO

## 2022-06-14 DIAGNOSIS — I5033 Acute on chronic diastolic (congestive) heart failure: Secondary | ICD-10-CM | POA: Diagnosis not present

## 2022-06-14 DIAGNOSIS — N184 Chronic kidney disease, stage 4 (severe): Secondary | ICD-10-CM | POA: Diagnosis not present

## 2022-06-14 LAB — BASIC METABOLIC PANEL
Anion gap: 9 (ref 5–15)
BUN: 67 mg/dL — ABNORMAL HIGH (ref 8–23)
CO2: 28 mmol/L (ref 22–32)
Calcium: 8.3 mg/dL — ABNORMAL LOW (ref 8.9–10.3)
Chloride: 105 mmol/L (ref 98–111)
Creatinine, Ser: 3.41 mg/dL — ABNORMAL HIGH (ref 0.61–1.24)
GFR, Estimated: 17 mL/min — ABNORMAL LOW (ref >60)
Glucose, Bld: 193 mg/dL — ABNORMAL HIGH (ref 70–99)
Potassium: 3.8 mmol/L (ref 3.5–5.1)
Sodium: 142 mmol/L (ref 135–145)

## 2022-06-14 LAB — GLUCOSE, CAPILLARY
Glucose-Capillary: 212 mg/dL — ABNORMAL HIGH (ref 70–99)
Glucose-Capillary: 260 mg/dL — ABNORMAL HIGH (ref 70–99)
Glucose-Capillary: 168 mg/dL — ABNORMAL HIGH (ref 70–99)
Glucose-Capillary: 269 mg/dL — ABNORMAL HIGH (ref 70–99)

## 2022-06-14 LAB — CBC
HCT: 32.1 % — ABNORMAL LOW (ref 39.0–52.0)
Hemoglobin: 10.2 g/dL — ABNORMAL LOW (ref 13.0–17.0)
MCH: 28.5 pg (ref 26.0–34.0)
MCHC: 31.8 g/dL (ref 30.0–36.0)
MCV: 89.7 fL (ref 80.0–100.0)
Platelets: 269 10*3/uL (ref 150–400)
RBC: 3.58 MIL/uL — ABNORMAL LOW (ref 4.22–5.81)
RDW: 13.7 % (ref 11.5–15.5)
WBC: 10.9 10*3/uL — ABNORMAL HIGH (ref 4.0–10.5)
nRBC: 0 % (ref 0.0–0.2)

## 2022-06-14 NOTE — Consult Note (Signed)
   Heart Failure Nurse Navigator Note  HFpEF 55 to 60%.  Grade 2 diastolic dysfunction.  Patient presented to the ED with complaints of worsening shortness of breath, PND, orthopnea and lower extremity edema.  Comorbidities:  Diabetes Chronic kidney disease stage IV Hypertension Obstructive sleep apnea with CPAP use Morbid obesity  Medications:  Amlodipine 10 mg daily Aspirin 81 mg daily Furosemide 80 mg IV every 12 hours Hydralazine 50 mg 3 times a day Isosorbide mononitrate 30 mg daily Metoprolol succinate 50 mg daily Pravachol 40 mg daily Tamsulosin 0.4 mg daily  Labs:  Sodium 142, potassium 3.8, chloride 105, CO2 28, BUN 67, creatinine 3.41, estimated GFR 17. Weight is 144.3 kg Intake 480 mL Output 2500 mL Blood pressure 147/65   Initial meeting with patient and wife who was at the bedside.  Discussed what heart failure is.  Discussed the signs and symptoms that brought him into the hospital.  Stressed the importance of low-sodium diet and removing the saltshaker from the table as patient presents and says he does salt some foods.  Wife states that due to a condition she has that they do try and follow a 2000 mg sodium restriction diet as he eats what she eats.  Also discussed fluid restriction of no more than 64 ounces in a days time and all that constitutes a liquid.  Went over the importance of daily weights and reporting 2 pound weight gain overnight or 5 pounds total within the week.  They voiced understanding.  Given the living with heart failure teaching booklet, zone magnet, info on low-sodium and heart failure along with weight chart.  It aware that he has appointment with the outpatient heart failure clinic on December 4 at 1030.  Both wife and patient states that he is going to have a permanent catheter placed for dialysis.  I explained to them once he starts dialysis that the heart failure team would back away as he will fluids would be managed by  nephrology.  They voiced understanding.  Pricilla Riffle RN [facility]

## 2022-06-14 NOTE — Care Management Important Message (Signed)
Important Message  Patient Details  Name: Clayburn Weekly MRN: [id number] Date of Birth: [birth year]   Medicare Important Message Given:  N/A - LOS <3 / Initial given by admissions     [name] 06/14/2022, 9:54 AM

## 2022-06-14 NOTE — Progress Notes (Signed)
James Clarke  MRN: [id number]  DOB/AGE: 1940-08-10 81 y.o.  Primary Care Physician:Feldpausch, Chrissie Noa, MD  Admit date: 06/12/2022  Chief Complaint:  Chief Complaint  Patient presents with   Shortness of Breath    S-Pt presented on  06/12/2022 with  Chief Complaint  Patient presents with   Shortness of Breath  .  Patient is 81 year old Caucasian male with a past medical history diabetes mellitus type 2, CKD stage IV, morbid obesity, hypertension, obstructive sleep apnea, nephrolithiasis, chronic diastolic CHF who came to the ER with chief complaint of shortness of breath. Upon evaluation in the ER patient was found to be in fluid overload and was admitted. Patient is well-known to nephrology practice. Patient follows up with Dr. Holley Raring as an outpatient.  Patient last visit was on April 23, 2022 with a plan to follow-up in a month.  Patient was seen today on second floor.Patient wife and daughter-in-law are present in the room.  Patient informed today that he was feeling better than yesterday Patient wife and daughter asked me relevant question about his kidney disease, renal placement therapy.  I again had extensive discussion with the patient and the family regarding renal placement therapy.  Educated patient about different modalities of renal placement therapy.  Educated patient on different modalities of access.  Patient and the family was understanding    Medications  amLODipine  10 mg Oral Daily   aspirin EC  81 mg Oral Daily   calcitRIOL  0.25 mcg Oral Daily   furosemide  80 mg Intravenous Q12H   heparin  5,000 Units Subcutaneous Q8H   hydrALAZINE  50 mg Oral TID   insulin aspart  0-20 Units Subcutaneous TID WC   insulin detemir  50 Units Subcutaneous QHS   isosorbide mononitrate  30 mg Oral Daily   metoprolol succinate  50 mg Oral Daily   potassium citrate  10 mEq Oral BID   pravastatin  40 mg Oral q1800   sodium chloride flush  3 mL Intravenous Q12H    tamsulosin  0.4 mg Oral Daily         CZY:SAYTK from the symptoms mentioned above,there are no other symptoms referable to all systems reviewed.  Physical Exam: Vital signs in last 24 hours: Temp:  [97.7 F (36.5 C)-98.4 F (36.9 C)] 97.7 F (36.5 C) (11/24 0901) Pulse Rate:  [65-79] 68 (11/24 0424) Resp:  [20-23] 22 (11/24 0901) BP: (120-147)/(57-71) 147/65 (11/24 0901) SpO2:  [92 %-100 %] 98 % (11/24 0901) Weight:  [144.3 kg] 144.3 kg (11/24 0400) Weight change: -2.665 kg Last BM Date : 06/13/22  Intake/Output from previous day: 11/23 0701 - 11/24 0700 In: 480 [P.O.:480] Out: 2500 [Urine:2500] No intake/output data recorded.   Physical Exam:  General- pt is awake,alert, oriented to time place and person  HEENT-head is atraumatic normocephalic sclera is anicteric  Resp- No acute REsp distress, Decreased breath sounds at bases, Minimal crackles  CVS- S1S2 regular in rate and rhythm  GIT- BS+, soft, Non tender , Non distended  EXT- trace LE Edema,  No Cyanosis    Lab Results:  CBC  Recent Labs    06/13/22 0240 06/14/22 0447  WBC 10.8* 10.9*  HGB 10.7* 10.2*  HCT 33.4* 32.1*  PLT 265 269    BMET  Recent Labs    06/13/22 0240 06/14/22 0447  NA 140 142  K 3.9 3.8  CL 107 105  CO2 27 28  GLUCOSE 178* 193*  BUN 57* 67*  CREATININE 3.02* 3.41*  CALCIUM 8.5* 8.3*      Most recent Creatinine trend  Lab Results  Component Value Date   CREATININE 3.41 (H) 06/14/2022   CREATININE 3.02 (H) 06/13/2022   CREATININE 2.90 (H) 06/12/2022      MICRO   Recent Results (from the past 240 hour(s))  Resp Panel by RT-PCR (Flu A&B, Covid) Anterior Nasal Swab     Status: None   Collection Time: 06/12/22  2:20 PM   Specimen: Anterior Nasal Swab  Result Value Ref Range Status   SARS Coronavirus 2 by RT PCR NEGATIVE NEGATIVE Final    Comment: (NOTE) SARS-CoV-2 target nucleic acids are NOT DETECTED.  The SARS-CoV-2 RNA is generally detectable in  upper respiratory specimens during the acute phase of infection. The lowest concentration of SARS-CoV-2 viral copies this assay can detect is 138 copies/mL. A negative result does not preclude SARS-Cov-2 infection and should not be used as the sole basis for treatment or other patient management decisions. A negative result may occur with  improper specimen collection/handling, submission of specimen other than nasopharyngeal swab, presence of viral mutation(s) within the areas targeted by this assay, and inadequate number of viral copies(<138 copies/mL). A negative result must be combined with clinical observations, patient history, and epidemiological information. The expected result is Negative.  Fact Sheet for Patients:  EntrepreneurPulse.com.au  Fact Sheet for Healthcare Providers:  IncredibleEmployment.be  This test is no t yet approved or cleared by the [place] FDA and  has been authorized for detection and/or diagnosis of SARS-CoV-2 by FDA under an Emergency Use Authorization (EUA). This EUA will remain  in effect (meaning this test can be used) for the duration of the COVID-19 declaration under Section 564(b)(1) of the Act, 21 U.S.C.section 360bbb-3(b)(1), unless the authorization is terminated  or revoked sooner.       Influenza A by PCR NEGATIVE NEGATIVE Final   Influenza B by PCR NEGATIVE NEGATIVE Final    Comment: (NOTE) The Xpert Xpress SARS-CoV-2/FLU/RSV plus assay is intended as an aid in the diagnosis of influenza from Nasopharyngeal swab specimens and should not be used as a sole basis for treatment. Nasal washings and aspirates are unacceptable for Xpert Xpress SARS-CoV-2/FLU/RSV testing.  Fact Sheet for Patients: EntrepreneurPulse.com.au  Fact Sheet for Healthcare Providers: IncredibleEmployment.be  This test is not yet approved or cleared by the [place] FDA and has been  authorized for detection and/or diagnosis of SARS-CoV-2 by FDA under an Emergency Use Authorization (EUA). This EUA will remain in effect (meaning this test can be used) for the duration of the COVID-19 declaration under Section 564(b)(1) of the Act, 21 U.S.C. section 360bbb-3(b)(1), unless the authorization is terminated or revoked.  Performed at [facility], [address] [place], [place] [address]          Impression:  Patient is 81 year old Caucasian male with a past medical history of diabetes mellitus, proteinuria, CKD stage IV, morbid obesity, hypertension, obstructive sleep apnea, nephrolithiasis, chronic diastolic CHF who is admitted with Acute on chronic diastolic CHF  1)Renal   AKI Patient has AKI secondary to ATN Patient has AKI secondary to diuretics  CKD stage IV. Patient has CKD stage IV secondary to diabetes mellitus. Patient has CKD stage V going back to 2014 Patient has had elevated microalbumin creatinine ratio of 504 going back to 2015 Creatinine trend 2023 2.9--3.2==>3.4 Patient had a peak creatinine of 5.9 during August admission 2022 2.4--3.0 2021 2.4--2.8 2015 2.1  Patient did require  renal placement therapy during his admission in August 2023.  Patient creatinine is currently at his baseline. Patient responding well to diuretics No need for renal placement therapy today   2)HTN    Blood pressure is stable    3)Anemia of chronic disease     Latest Ref Rng & Units 06/14/2022    4:47 AM 06/13/2022    2:40 AM 06/12/2022   12:59 PM  CBC  WBC 4.0 - 10.5 K/uL 10.9  10.8  11.8   Hemoglobin 13.0 - 17.0 g/dL 10.2  10.7  11.7   Hematocrit 39.0 - 52.0 % 32.1  33.4  36.7   Platelets 150 - 400 K/uL 269  265  307        HGb at goal (9--11)   4) Secondary hyperparathyroidism -CKD Mineral-Bone Disorder    Lab Results  Component Value Date   CALCIUM 8.3 (L) 06/14/2022   CAION 1.19 09/11/2020   PHOS 4.3 (H) 04/10/2022     Secondary Hyperparathyroidism present . Patient intact PTH as an outpatient was 148 Patient is on calcitriol Phosphorus at goal.   5) nephrotic range proteinuria  Patient has had nephrotic range proteinuria going back to December 2022 Patient currently not on any RAAS blockade secondary to his low GFR   6) Electrolytes      Latest Ref Rng & Units 06/14/2022    4:47 AM 06/13/2022    2:40 AM 06/12/2022   12:59 PM  BMP  Glucose 70 - 99 mg/dL 193  178  184   BUN 8 - 23 mg/dL 67  57  56   Creatinine 0.61 - 1.24 mg/dL 3.41  3.02  2.90   Sodium 135 - 145 mmol/L 142  140  140   Potassium 3.5 - 5.1 mmol/L 3.8  3.9  4.3   Chloride 98 - 111 mmol/L 105  107  108   CO2 22 - 32 mmol/L '28  27  25   '$ Calcium 8.9 - 10.3 mg/dL 8.3  8.5  8.9      Sodium Normonatremic   Potassium Normokalemic    7)Acid base   Co2 at goal  8) NephroLithiasis Patient has history of nephrolithiasis Patient continues to be on potassium citrate  9)Acute on chronic diastolic CHF/acute Pulmonary edema Patient was admitted with fluid overload Patient is on Lasix 60 mg IV twice daily Patient is currently 4.0L negative Will continue patient on current dose of diuretics-Lasix 80 mg IV twice daily Will follow    Plan:   I discussed the patient about worsening of his GFR and possible need for renal placement therapy I again had extensive discussion the patient regarding need/risk/benefit of renal placement therapy. I discussed with the patient different modalities of renal placement therapy.  I discussed the patient multiple modalities of access. I answered patient's queries to the best of my ability     Amisha Pospisil s Theador Hawthorne 06/14/2022, 9:39 AM

## 2022-06-14 NOTE — Progress Notes (Signed)
PROGRESS NOTE    James Clarke  GNF:[id number] DOB: 21-Dec-1940 DOA: 06/12/2022 PCP: Sofie Hartigan, MD   Assessment & Plan:   Principal Problem:   Acute on chronic diastolic CHF (congestive heart failure) ([place]) Active Problems:   Type 2 diabetes mellitus with renal manifestations ([place])   Hypertension   Sleep apnea   Morbid obesity due to excess calories ([facility])   Chest pain at rest  Assessment and Plan: Acute on chronic diastolic CHF: continue on IV lasix. Monitor I/Os. Neg approx 1.0 L today so far. Worsening shortness of breath associated with orthopnea and bilateral lower extremity swelling on admission. Echo from 08/23 showed an EF of 55 to 60% with grade 2 diastolic dysfunction. Continue on imdur, metoprolol, aspirin, statin as per cardio. Cardio following and recs apprec    DM2: likely poorly controlled. Continue on levemir, SSI w/ accuchecks   CKDIV: Cr is trending up likely secondary to lasix use. Nephro following and recs apprec    HTN: continue on imdur, metoprolol, hydralazine, amlodipine  OSA: CPAP qhs    Morbid obesity: BMI 45.3. Complicates overall care & prognosis    Chest pain: at rest. W/ minimally elevated troponins, likely secondary to demand ischemia. Continue on imdur as per cardio       DVT prophylaxis:  heparin  Code Status: full Family Communication: discussed pt's care w/ pt's family at bedside and answered their questions  Disposition Plan: unclear  Level of care: Progressive  Status is: Inpatient Remains inpatient appropriate because: severity of illness   Consultants:  Nephro   Procedures:   Antimicrobials:    Subjective: Pt c/o malaise    Objective: Vitals:   06/13/22 2308 06/14/22 0400 06/14/22 0424 06/14/22 0901  BP: 123/62  138/60 (!) 147/65  Pulse: 65  68   Resp: 20  20 (!) 22  Temp: 98.4 F (36.9 C)  98 F (36.7 C) 97.7 F (36.5 C)  TempSrc:    Oral  SpO2: 92%  93% 98%  Weight:  (!) 144.3 kg    Height:         Intake/Output Summary (Last 24 hours) at 06/14/2022 1457 Last data filed at 06/14/2022 1406 Gross per 24 hour  Intake 420 ml  Output 1500 ml  Net -1080 ml   Filed Weights   06/12/22 1248 06/12/22 2004 06/14/22 0400  Weight: (!) 147 kg (!) 143.3 kg (!) 144.3 kg    Examination:  General exam: Appears comfortable  Respiratory system: diminished breath sounds b/l  Cardiovascular system: S1/S2+. No rubs or clicks   Gastrointestinal system: Abd is soft, NT, obese & normal bowel sounds  Central nervous system: Alert and oriented. Moves all extremities  Psychiatry: judgement and insight appears normal. Flat mood and affect    Data Reviewed: I have personally reviewed following labs and imaging studies  CBC: Recent Labs  Lab 06/12/22 1259 06/13/22 0240 06/14/22 0447  WBC 11.8* 10.8* 10.9*  HGB 11.7* 10.7* 10.2*  HCT 36.7* 33.4* 32.1*  MCV 89.7 89.1 89.7  PLT 307 265 846   Basic Metabolic Panel: Recent Labs  Lab 06/12/22 1259 06/13/22 0025 06/13/22 0240 06/14/22 0447  NA 140  --  140 142  K 4.3  --  3.9 3.8  CL 108  --  107 105  CO2 25  --  27 28  GLUCOSE 184*  --  178* 193*  BUN 56*  --  57* 67*  CREATININE 2.90*  --  3.02* 3.41*  CALCIUM 8.9  --  8.5* 8.3*  MG  --  2.1  --   --    GFR: Estimated Creatinine Clearance: 24.4 mL/min (A) (by C-G formula based on SCr of 3.41 mg/dL (H)). Liver Function Tests: No results for input(s): "AST", "ALT", "ALKPHOS", "BILITOT", "PROT", "ALBUMIN" in the last 168 hours. No results for input(s): "LIPASE", "AMYLASE" in the last 168 hours. No results for input(s): "AMMONIA" in the last 168 hours. Coagulation Profile: No results for input(s): "INR", "PROTIME" in the last 168 hours. Cardiac Enzymes: No results for input(s): "CKTOTAL", "CKMB", "CKMBINDEX", "TROPONINI" in the last 168 hours. BNP (last 3 results) No results for input(s): "PROBNP" in the last 8760 hours. HbA1C: No results for input(s): "HGBA1C" in the last 72  hours. CBG: Recent Labs  Lab 06/13/22 1138 06/13/22 1643 06/13/22 2004 06/14/22 0816 06/14/22 1203  GLUCAP 289* 263* 287* 212* 260*   Lipid Profile: No results for input(s): "CHOL", "HDL", "LDLCALC", "TRIG", "CHOLHDL", "LDLDIRECT" in the last 72 hours. Thyroid Function Tests: No results for input(s): "TSH", "T4TOTAL", "FREET4", "T3FREE", "THYROIDAB" in the last 72 hours. Anemia Panel: No results for input(s): "VITAMINB12", "FOLATE", "FERRITIN", "TIBC", "IRON", "RETICCTPCT" in the last 72 hours. Sepsis Labs: No results for input(s): "PROCALCITON", "LATICACIDVEN" in the last 168 hours.  Recent Results (from the past 240 hour(s))  Resp Panel by RT-PCR (Flu A&B, Covid) Anterior Nasal Swab     Status: None   Collection Time: 06/12/22  2:20 PM   Specimen: Anterior Nasal Swab  Result Value Ref Range Status   SARS Coronavirus 2 by RT PCR NEGATIVE NEGATIVE Final    Comment: (NOTE) SARS-CoV-2 target nucleic acids are NOT DETECTED.  The SARS-CoV-2 RNA is generally detectable in upper respiratory specimens during the acute phase of infection. The lowest concentration of SARS-CoV-2 viral copies this assay can detect is 138 copies/mL. A negative result does not preclude SARS-Cov-2 infection and should not be used as the sole basis for treatment or other patient management decisions. A negative result may occur with  improper specimen collection/handling, submission of specimen other than nasopharyngeal swab, presence of viral mutation(s) within the areas targeted by this assay, and inadequate number of viral copies(<138 copies/mL). A negative result must be combined with clinical observations, patient history, and epidemiological information. The expected result is Negative.  Fact Sheet for Patients:  EntrepreneurPulse.com.au  Fact Sheet for Healthcare Providers:  IncredibleEmployment.be  This test is no t yet approved or cleared by the [place] FDA and  has been authorized for detection and/or diagnosis of SARS-CoV-2 by FDA under an Emergency Use Authorization (EUA). This EUA will remain  in effect (meaning this test can be used) for the duration of the COVID-19 declaration under Section 564(b)(1) of the Act, 21 U.S.C.section 360bbb-3(b)(1), unless the authorization is terminated  or revoked sooner.       Influenza A by PCR NEGATIVE NEGATIVE Final   Influenza B by PCR NEGATIVE NEGATIVE Final    Comment: (NOTE) The Xpert Xpress SARS-CoV-2/FLU/RSV plus assay is intended as an aid in the diagnosis of influenza from Nasopharyngeal swab specimens and should not be used as a sole basis for treatment. Nasal washings and aspirates are unacceptable for Xpert Xpress SARS-CoV-2/FLU/RSV testing.  Fact Sheet for Patients: EntrepreneurPulse.com.au  Fact Sheet for Healthcare Providers: IncredibleEmployment.be  This test is not yet approved or cleared by the [place] FDA and has been authorized for detection and/or diagnosis of SARS-CoV-2 by FDA under an Emergency Use Authorization (EUA). This EUA will remain in effect (meaning  this test can be used) for the duration of the COVID-19 declaration under Section 564(b)(1) of the Act, 21 U.S.C. section 360bbb-3(b)(1), unless the authorization is terminated or revoked.  Performed at [facility], [address] [place], [place] [address]          Radiology Studies: DG Chest 1 View  Result Date: 06/14/2022 CLINICAL DATA:  Shortness of breath EXAM: CHEST  1 VIEW COMPARISON:  06/12/2022 and prior studies FINDINGS: The cardiomediastinal silhouette is unchanged. Mild interstitial opacities are again noted as well as mild bibasilar opacities/atelectasis. There is no evidence of pneumothorax or acute bony abnormality. There has been little interval change since prior study. IMPRESSION: Unchanged appearance of the chest with mild  interstitial opacities and mild bibasilar opacities/atelectasis. Electronically Signed   By: Margarette Canada M.D.   On: 06/14/2022 08:46   CT CHEST WO CONTRAST  Result Date: 06/12/2022 CLINICAL DATA:  Chest pain and shortness of breath. EXAM: CT CHEST WITHOUT CONTRAST TECHNIQUE: Multidetector CT imaging of the chest was performed following the standard protocol without IV contrast. RADIATION DOSE REDUCTION: This exam was performed according to the departmental dose-optimization program which includes automated exposure control, adjustment of the mA and/or kV according to patient size and/or use of iterative reconstruction technique. COMPARISON:  CT scan from 2015. FINDINGS: Cardiovascular: The heart is normal in size for age. Small pericardial effusion noted. The aorta is normal in caliber. Scattered atherosclerotic calcifications. Extensive three-vessel coronary artery calcifications. Mediastinum/Nodes: Scattered borderline mediastinal and hilar lymph nodes, likely reactive. The esophagus is grossly normal. Lungs/Pleura: Small bilateral pleural effusions with overlying atelectasis. Changes of interstitial lung disease with peripheral subpleural reticulations, thickening of the interlobular septi, faint nodularity and extensive lower lobe predominant parenchymal calcifications. This could be due to chronic inflammation, previous infection, aspiration and less likely pulmonary alveolar microlithiasis. Patchy nodular ground-glass opacities in the right lower lobe may suggest a more acute inflammatory process and aspiration or bronchopneumonia are possibilities. Upper Abdomen: No significant upper abdominal findings. No hepatic or adrenal gland lesions. No upper abdominal adenopathy. Scattered vascular calcifications. Musculoskeletal: No chest wall mass, supraclavicular or axillary adenopathy. The bony thorax is intact. IMPRESSION: 1. Changes of interstitial lung disease as detailed above. 2. Stable lower lobe  predominant parenchymal lung calcifications as above. 3. Patchy nodular ground-glass opacities in the right lower lobe may suggest a more acute inflammatory process and aspiration or bronchopneumonia are possibilities. 4. Small bilateral pleural effusions with overlying atelectasis. 5. Small pericardial effusion. 6. Extensive three-vessel coronary artery calcifications. Electronically Signed   By: Marijo Sanes M.D.   On: 06/12/2022 16:42        Scheduled Meds:  amLODipine  10 mg Oral Daily   aspirin EC  81 mg Oral Daily   calcitRIOL  0.25 mcg Oral Daily   furosemide  80 mg Intravenous Q12H   heparin  5,000 Units Subcutaneous Q8H   hydrALAZINE  50 mg Oral TID   insulin aspart  0-20 Units Subcutaneous TID WC   insulin detemir  50 Units Subcutaneous QHS   isosorbide mononitrate  30 mg Oral Daily   metoprolol succinate  50 mg Oral Daily   potassium citrate  10 mEq Oral BID   pravastatin  40 mg Oral q1800   sodium chloride flush  3 mL Intravenous Q12H   tamsulosin  0.4 mg Oral Daily   Continuous Infusions:  sodium chloride       LOS: 2 days    Time spent: 30 mins  Wyvonnia Dusky, MD [facility] Pager [phone]  If 7PM-7AM, please contact night-coverage [email or web address] 06/14/2022, 2:57 PM

## 2022-06-14 NOTE — Evaluation (Signed)
Occupational Therapy Evaluation Patient Details Name: James Clarke MRN: [id number] DOB: 1941/02/05 Today's Date: 06/14/2022   History of Present Illness presented to ER secondary to progressive SOB; admitted for management of acute/chronic CHF   Clinical Impression   Pt was seen for OT evaluation this date. Prior to hospital admission, pt was generally mod indep with ADL aside from assist for washing his back in the shower that his spouse assisted with. In the past 2 weeks or so, pt/spouse report pt with increased fatigue and SOB. Pt ambulated around nurses station with PT prior to initiation of OT evaluation. Pt seated EOB Pt presents to acute OT demonstrating impaired ADL performance and functional mobility 2/2 decreased activity tolerance, balance, and cardiopulmonary status (See OT problem list). Pt currently requires MIN A for LB ADL tasks and CGA for ADL transfers with RW. Pt desats to 87-88% on room air with exertion. Pt/family educated in home/routines modifications and facilitated problem solving for bathing and grooming routines to improve safety, indep, and minimize SOB and over exertion. Pt shares that he typically stands at the sink to shave while the water warms up and then stands in the shower to quickly rinse off and wash his hair while his spouse washes his back. He will desats to 84-85% with this routine requiring time for recovery afterwards. Encouraged pt/family to consider sitting for shaving prior to shower, possibly sit for portion of shower (has drop down bench but pt/spouse report this is challenging to use given limited space), and sitting for drying off to decrease desaturation in SpO2. Pt/family verbalized understanding. Pt would benefit from skilled OT services to address noted impairments and functional limitations (see below for any additional details) in order to maximize safety and independence while minimizing falls risk and caregiver burden. Upon hospital discharge,  recommend HHOT to maximize pt safety and return to functional independence during meaningful occupations of daily life.    Recommendations for follow up therapy are one component of a multi-disciplinary discharge planning process, led by the attending physician.  Recommendations may be updated based on patient status, additional functional criteria and insurance authorization.   Follow Up Recommendations  Home health OT     Assistance Recommended at Discharge Intermittent Supervision/Assistance  Patient can return home with the following A little help with walking and/or transfers;A little help with bathing/dressing/bathroom;Assistance with cooking/housework;Assist for transportation;Help with stairs or ramp for entrance    Functional Status Assessment  Patient has had a recent decline in their functional status and demonstrates the ability to make significant improvements in function in a reasonable and predictable amount of time.  Equipment Recommendations  None recommended by OT    Recommendations for Other Services       Precautions / Restrictions Precautions Precautions: Fall      Mobility Bed Mobility Overal bed mobility: Modified Independent                  Transfers Overall transfer level: Needs assistance Equipment used: Rolling walker (2 wheels) Transfers: Sit to/from Stand Sit to Stand: Min guard, Supervision                  Balance Overall balance assessment: Needs assistance (good sitting balance, fair standing balance)                                         ADL either performed or assessed  with clinical judgement   ADL                                         General ADL Comments: Pt requires MIN A for LB ADL Tasks (max a for socks which spouse assists with), MIN A for bathing (baseline), and Supv- CGA for ADL transfers with RW     Vision         Perception     Praxis      Pertinent Vitals/Pain  Pain Assessment Pain Assessment: No/denies pain     Hand Dominance     Extremity/Trunk Assessment Upper Extremity Assessment Upper Extremity Assessment: Overall WFL for tasks assessed   Lower Extremity Assessment Lower Extremity Assessment: Overall WFL for tasks assessed (grossly 4 to 4+/5 throughout; generalized edema mid-calf distally)       Communication Communication Communication: No difficulties   Cognition Arousal/Alertness: Awake/alert Behavior During Therapy: WFL for tasks assessed/performed Overall Cognitive Status: Within Functional Limits for tasks assessed                                       General Comments       Exercises Other Exercises Other Exercises: Pt/family educated in home/routines modifications and facilitated problem solving for bathing and grooming routines to improve safety, indep, and minimize SOB and over exertion. Pt shares that he typically stands at the sink to shave while the water warms up and then stands in the shower to quickly rinse off and wash his hair while his spouse washes his back. He will desats to 84-85% with this routine requiring time for recovery afterwards. Encouraged pt/family to consider sitting for shaving prior to shower, possibly sit for portion of shower (has drop down bench but pt/spouse report this is challenging to use given limited space), and sitting for drying off to decrease desaturation in SpO2. Pt/family verbalized understanding.   Shoulder Instructions      Home Living Family/patient expects to be discharged to:: Private residence (Simultaneous filing. User may not have seen previous data.) Living Arrangements: Spouse/significant other (Simultaneous filing. User may not have seen previous data.) Available Help at Discharge: Family;Available 24 hours/day (Simultaneous filing. User may not have seen previous data.) Type of Home: House (Simultaneous filing. User may not have seen previous data.) Home  Access: Stairs to enter (Simultaneous filing. User may not have seen previous data.) Entrance Stairs-Number of Steps: 3-4 (Simultaneous filing. User may not have seen previous data.) Entrance Stairs-Rails: Right (Simultaneous filing. User may not have seen previous data.) Home Layout: One level (Simultaneous filing. User may not have seen previous data.)     Bathroom Shower/Tub: Occupational psychologist: Handicapped height     Home Equipment: Cane - single point;Grab bars - tub/shower;Shower seat - built in (lift chair, adjustable bed  Simultaneous filing. User may not have seen previous data.)          Prior Functioning/Environment Prior Level of Function : Independent/Modified Independent             Mobility Comments: IND household mobility with no AD; intermittent use of SPC for community mobilization. Denies fall history within previous six months ADLs Comments: Pt reports spouse assists with bathing, typically able to complete ADL tasks without modification however pt does endorse significant fatigue and  desats to 84-85% after showering standing up and shaving at sink standing (Simultaneous filing. User may not have seen previous data.)        OT Problem List: Decreased activity tolerance;Decreased knowledge of use of DME or AE;Cardiopulmonary status limiting activity      OT Treatment/Interventions: Self-care/ADL training;Therapeutic exercise;Therapeutic activities;Energy conservation;DME and/or AE instruction;Patient/family education;Balance training    OT Goals(Current goals can be found in the care plan section) Acute Rehab OT Goals Patient Stated Goal: go home OT Goal Formulation: With patient/family Time For Goal Achievement: 06/28/22 Potential to Achieve Goals: Good ADL Goals Pt Will Perform Lower Body Dressing: with modified independence Pt Will Transfer to Toilet: with modified independence Pt Will Perform Toileting - Clothing Manipulation and  hygiene: with modified independence Additional ADL Goal #1: Pt will identify at least 2 learned ECS to incorporate into daily ADL/IADL routines to maximize safety/indep.  OT Frequency: Min 2X/week    Co-evaluation              AM-PAC OT "6 Clicks" Daily Activity     Outcome Measure Help from another person eating meals?: None Help from another person taking care of personal grooming?: A Little Help from another person toileting, which includes using toliet, bedpan, or urinal?: A Little Help from another person bathing (including washing, rinsing, drying)?: A Little Help from another person to put on and taking off regular upper body clothing?: None Help from another person to put on and taking off regular lower body clothing?: A Little 6 Click Score: 20   End of Session Equipment Utilized During Treatment: Rolling walker (2 wheels)  Activity Tolerance: Patient tolerated treatment well Patient left: in bed;with call bell/phone within reach;with bed alarm set;with family/visitor present;with nursing/sitter in room  OT Visit Diagnosis: Other abnormalities of gait and mobility (R26.89)                Time: 0258-5277 OT Time Calculation (min): 20 min Charges:  OT General Charges $OT Visit: 1 Visit OT Evaluation $OT Eval Moderate Complexity: 1 Mod OT Treatments $Self Care/Home Management : 8-22 mins  Ardeth Perfect., MPH, MS, OTR/L ascom [phone] 06/14/22, 4:50 PM

## 2022-06-14 NOTE — Evaluation (Signed)
Physical Therapy Evaluation Patient Details Name: James Clarke MRN: [id number] DOB: 1941/05/10 Today's Date: 06/14/2022  History of Present Illness  presented to ER secondary to progressive SOB; admitted for management of acute/chronic CHF  Clinical Impression  Patient resting in bed upon arrival to room; wife and son at beside.  Patient alert and oriented, follows commands and agreeable to participation with treatment session.  Denies pain, but does endorse generally feeling "not well" due to acute medical issue.  Bilat UE/LE generally weak and deconditioned, but functional for basic transfers and gait; LEs generally edematous.  Able to complete bed mobility with mod indep; sit/stand, basic transfers and gait (200') with RW, cga/close sup.  Demonstrates reciprocal stepping pattern with fair cadence; no buckling or LOB. Mod SOB with exertion (poor awareness of activity pacing, need for rest periods) with desat to 87%, quickly rebounding >90% within 10-15 seconds of seated rest  Would benefit from skilled PT to address above deficits and promote optimal return to PLOF.; Recommend transition to HHPT upon discharge from acute hospitalization.       Recommendations for follow up therapy are one component of a multi-disciplinary discharge planning process, led by the attending physician.  Recommendations may be updated based on patient status, additional functional criteria and insurance authorization.  Follow Up Recommendations Home health PT      Assistance Recommended at Discharge Frequent or constant Supervision/Assistance  Patient can return home with the following  A little help with walking and/or transfers;A little help with bathing/dressing/bathroom    Equipment Recommendations    Recommendations for Other Services       Functional Status Assessment Patient has had a recent decline in their functional status and demonstrates the ability to make significant improvements in function  in a reasonable and predictable amount of time.     Precautions / Restrictions Precautions Precautions: Fall      Mobility  Bed Mobility Overal bed mobility: Modified Independent                  Transfers Overall transfer level: Needs assistance Equipment used: Rolling walker (2 wheels) Transfers: Sit to/from Stand Sit to Stand: Min guard, Supervision                Ambulation/Gait Ambulation/Gait assistance: Min guard, Supervision Gait Distance (Feet): 200 Feet Assistive device: Rolling walker (2 wheels)         General Gait Details: reciprocal stepping pattern with fair cadence; no buckling or LOB. Mod SOB with exertion (poor awareness of activity pacing, need for rest periods) with desat to 87%, quickly rebounding >90% within 10-15 seconds of seated rest  Stairs            Wheelchair Mobility    Modified Rankin (Stroke Patients Only)       Balance Overall balance assessment: Needs assistance Sitting-balance support: No upper extremity supported, Feet supported Sitting balance-Leahy Scale: Good     Standing balance support: Bilateral upper extremity supported Standing balance-Leahy Scale: Fair                               Pertinent Vitals/Pain Pain Assessment Pain Assessment: No/denies pain    Home Living Family/patient expects to be discharged to:: Private residence (Simultaneous filing. User may not have seen previous data.) Living Arrangements: Spouse/significant other (Simultaneous filing. User may not have seen previous data.) Available Help at Discharge: Family;Available 24 hours/day (Simultaneous filing. User may not have  seen previous data.) Type of Home: House (Simultaneous filing. User may not have seen previous data.) Home Access: Stairs to enter (Simultaneous filing. User may not have seen previous data.) Entrance Stairs-Rails: Right (Simultaneous filing. User may not have seen previous data.) Entrance  Stairs-Number of Steps: 3-4 (Simultaneous filing. User may not have seen previous data.)   Home Layout: One level (Simultaneous filing. User may not have seen previous data.) Home Equipment: Cane - single point;Grab bars - tub/shower;Shower seat - built in (lift chair, adjustable bed  Simultaneous filing. User may not have seen previous data.)      Prior Function Prior Level of Function : Independent/Modified Independent             Mobility Comments: IND household mobility with no AD; intermittent use of SPC for community mobilization. Denies fall history within previous six months ADLs Comments: Pt reports spouse assists with bathing, typically able to complete ADL tasks without modification however pt does endorse significant fatigue and desats to 84-85% after showering standing up and shaving at sink standing (Simultaneous filing. User may not have seen previous data.)     Hand Dominance        Extremity/Trunk Assessment   Upper Extremity Assessment Upper Extremity Assessment: Overall WFL for tasks assessed    Lower Extremity Assessment Lower Extremity Assessment: Overall WFL for tasks assessed (grossly 4 to 4+/5 throughout; generalized edema mid-calf distally)       Communication   Communication: No difficulties  Cognition Arousal/Alertness: Awake/alert Behavior During Therapy: WFL for tasks assessed/performed Overall Cognitive Status: Within Functional Limits for tasks assessed                                          General Comments      Exercises     Assessment/Plan    PT Assessment Patient needs continued PT services  PT Problem List Decreased strength;Decreased activity tolerance;Decreased balance;Decreased mobility;Decreased knowledge of use of DME;Decreased safety awareness;Decreased knowledge of precautions;Cardiopulmonary status limiting activity;Obesity       PT Treatment Interventions DME instruction;Gait training;Stair  training;Functional mobility training;Therapeutic activities;Therapeutic exercise;Balance training;Patient/family education    PT Goals (Current goals can be found in the Care Plan section)  Acute Rehab PT Goals Patient Stated Goal: to return home PT Goal Formulation: With patient/family Time For Goal Achievement: 06/28/22 Potential to Achieve Goals: Good    Frequency Min 2X/week     Co-evaluation               AM-PAC PT "6 Clicks" Mobility  Outcome Measure Help needed turning from your back to your side while in a flat bed without using bedrails?: None Help needed moving from lying on your back to sitting on the side of a flat bed without using bedrails?: None Help needed moving to and from a bed to a chair (including a wheelchair)?: A Little Help needed standing up from a chair using your arms (e.g., wheelchair or bedside chair)?: A Little Help needed to walk in hospital room?: A Little Help needed climbing 3-5 steps with a railing? : A Little 6 Click Score: 20    End of Session Equipment Utilized During Treatment: Gait belt Activity Tolerance: Patient tolerated treatment well Patient left: with call bell/phone within reach (seated edge of bed, OT at bedside for assessment) Nurse Communication: Mobility status PT Visit Diagnosis: Muscle weakness (generalized) (M62.81);Difficulty in walking, not elsewhere  classified (R26.2)    Time: 8677-3736 PT Time Calculation (min) (ACUTE ONLY): 23 min   Charges:   PT Evaluation $PT Eval Moderate Complexity: 1 Mod         Vollie Aaron H. Owens Shark, PT, DPT, NCS 06/14/22, 4:44 PM [phone]

## 2022-06-14 NOTE — Progress Notes (Signed)
[facility] Encounter Note  Patient: James Clarke / Admit Date: 06/12/2022 / Date of Encounter: 06/14/2022, 4:56 PM   Subjective: 11/23.  Patient had episode of chest discomfort last night not particularly relieved by nitroglycerin.  Patient's oxygenation may have helped with improvements of symptoms.  Troponin levels have been 61/78/82 more consistent with volume overload and pulmonary edema rather than acute coronary syndrome.  EKG was unchanged from admission with nonspecific ST changes.  The patient has had 2 L of urine output which has improved his current condition although primary issue appears to be chronic kidney disease with stage IV.  11/24.  Patient has had significant amount of urine output with significant improvements of symptoms.  He has much less lower extremity edema and improved pulmonary edema with Rales.  The patient is able to ambulate more and with no evidence of chest discomfort or severe shortness of breath.  Echocardiogram earlier this year showed normal LV systolic function and no evidence of significant valvular heart disease  Cardiac catheterization showed moderate atherosclerosis of 3 vessels without evidence of need for intervention  Review of Systems: Positive for: Shortness of breath and no chest pain Negative for: Vision change, hearing change, syncope, dizziness, nausea, vomiting,diarrhea, bloody stool, stomach pain, cough, congestion, diaphoresis, urinary frequency, urinary pain,skin lesions, skin rashes Others previously listed  Objective: Telemetry: Normal sinus rhythm Physical Exam: Blood pressure 133/73, pulse 68, temperature 97.8 F (36.6 C), temperature source Oral, resp. rate (!) 22, height '5\' 10"'$  (1.778 m), weight (!) 144.3 kg, SpO2 98 %. Body mass index is 45.65 kg/m. General: Well developed, well nourished, in no acute distress. Head: Normocephalic, atraumatic, sclera non-icteric, no xanthomas, nares are without  discharge. Neck: No apparent masses Lungs: Normal respirations with no wheezes, no rhonchi, basilar rales , no crackles   Heart: Regular rate and rhythm, normal S1 S2, no murmur, no rub, no gallop, PMI is normal size and placement, carotid upstroke normal without bruit, jugular venous pressure normal Abdomen: Soft, non-tender, non-distended with normoactive bowel sounds. No hepatosplenomegaly. Abdominal aorta is normal size without bruit Extremities: 1+'s edema, no clubbing, no cyanosis, no ulcers,  Peripheral: 2+ radial, 2+ femoral, 2+ dorsal pedal pulses Neuro: Alert and oriented. Moves all extremities spontaneously. Psych:  Responds to questions appropriately with a normal affect.   Intake/Output Summary (Last 24 hours) at 06/14/2022 1656 Last data filed at 06/14/2022 1410 Gross per 24 hour  Intake 420 ml  Output 2300 ml  Net -1880 ml     Inpatient Medications:   amLODipine  10 mg Oral Daily   aspirin EC  81 mg Oral Daily   calcitRIOL  0.25 mcg Oral Daily   furosemide  80 mg Intravenous Q12H   heparin  5,000 Units Subcutaneous Q8H   hydrALAZINE  50 mg Oral TID   insulin aspart  0-20 Units Subcutaneous TID WC   insulin detemir  50 Units Subcutaneous QHS   isosorbide mononitrate  30 mg Oral Daily   metoprolol succinate  50 mg Oral Daily   potassium citrate  10 mEq Oral BID   pravastatin  40 mg Oral q1800   sodium chloride flush  3 mL Intravenous Q12H   tamsulosin  0.4 mg Oral Daily   Infusions:   sodium chloride      Labs: Recent Labs    06/13/22 0025 06/13/22 0240 06/14/22 0447  NA  --  140 142  K  --  3.9 3.8  CL  --  107 105  CO2  --  27 28  GLUCOSE  --  178* 193*  BUN  --  57* 67*  CREATININE  --  3.02* 3.41*  CALCIUM  --  8.5* 8.3*  MG 2.1  --   --     No results for input(s): "AST", "ALT", "ALKPHOS", "BILITOT", "PROT", "ALBUMIN" in the last 72 hours. Recent Labs    06/13/22 0240 06/14/22 0447  WBC 10.8* 10.9*  HGB 10.7* 10.2*  HCT 33.4* 32.1*   MCV 89.1 89.7  PLT 265 269    No results for input(s): "CKTOTAL", "CKMB", "TROPONINI" in the last 72 hours. Invalid input(s): "POCBNP" No results for input(s): "HGBA1C" in the last 72 hours.   Weights: Filed Weights   06/12/22 1248 06/12/22 2004 06/14/22 0400  Weight: (!) 147 kg (!) 143.3 kg (!) 144.3 kg     Radiology/Studies:  DG Chest 1 View  Result Date: 06/14/2022 CLINICAL DATA:  Shortness of breath EXAM: CHEST  1 VIEW COMPARISON:  06/12/2022 and prior studies FINDINGS: The cardiomediastinal silhouette is unchanged. Mild interstitial opacities are again noted as well as mild bibasilar opacities/atelectasis. There is no evidence of pneumothorax or acute bony abnormality. There has been little interval change since prior study. IMPRESSION: Unchanged appearance of the chest with mild interstitial opacities and mild bibasilar opacities/atelectasis. Electronically Signed   By: Margarette Canada M.D.   On: 06/14/2022 08:46   CT CHEST WO CONTRAST  Result Date: 06/12/2022 CLINICAL DATA:  Chest pain and shortness of breath. EXAM: CT CHEST WITHOUT CONTRAST TECHNIQUE: Multidetector CT imaging of the chest was performed following the standard protocol without IV contrast. RADIATION DOSE REDUCTION: This exam was performed according to the departmental dose-optimization program which includes automated exposure control, adjustment of the mA and/or kV according to patient size and/or use of iterative reconstruction technique. COMPARISON:  CT scan from 2015. FINDINGS: Cardiovascular: The heart is normal in size for age. Small pericardial effusion noted. The aorta is normal in caliber. Scattered atherosclerotic calcifications. Extensive three-vessel coronary artery calcifications. Mediastinum/Nodes: Scattered borderline mediastinal and hilar lymph nodes, likely reactive. The esophagus is grossly normal. Lungs/Pleura: Small bilateral pleural effusions with overlying atelectasis. Changes of interstitial lung  disease with peripheral subpleural reticulations, thickening of the interlobular septi, faint nodularity and extensive lower lobe predominant parenchymal calcifications. This could be due to chronic inflammation, previous infection, aspiration and less likely pulmonary alveolar microlithiasis. Patchy nodular ground-glass opacities in the right lower lobe may suggest a more acute inflammatory process and aspiration or bronchopneumonia are possibilities. Upper Abdomen: No significant upper abdominal findings. No hepatic or adrenal gland lesions. No upper abdominal adenopathy. Scattered vascular calcifications. Musculoskeletal: No chest wall mass, supraclavicular or axillary adenopathy. The bony thorax is intact. IMPRESSION: 1. Changes of interstitial lung disease as detailed above. 2. Stable lower lobe predominant parenchymal lung calcifications as above. 3. Patchy nodular ground-glass opacities in the right lower lobe may suggest a more acute inflammatory process and aspiration or bronchopneumonia are possibilities. 4. Small bilateral pleural effusions with overlying atelectasis. 5. Small pericardial effusion. 6. Extensive three-vessel coronary artery calcifications. Electronically Signed   By: Marijo Sanes M.D.   On: 06/12/2022 16:42   DG Chest 2 View  Result Date: 06/12/2022 CLINICAL DATA:  Chest pain and shortness of breath since last night, slept in recliner with CPAP all night EXAM: CHEST - 2 VIEW COMPARISON:  09/19/2009 FINDINGS: Enlargement of cardiac silhouette. Mediastinal contours and pulmonary vascularity normal. Interstitial infiltrates at the mid to lower lungs question edema. Small  LEFT pleural effusion. No upper lobe infiltrate or pneumothorax. Osseous structures demineralized. IMPRESSION: Enlargement of cardiac silhouette with bibasilar infiltrates question pulmonary edema. Small associated LEFT pleural effusion. Electronically Signed   By: Lavonia Dana M.D.   On: 06/12/2022 13:21   CT RENAL  STONE STUDY  Result Date: 05/23/2022 CLINICAL DATA:  Flank pain. EXAM: CT ABDOMEN AND PELVIS WITHOUT CONTRAST TECHNIQUE: Multidetector CT imaging of the abdomen and pelvis was performed following the standard protocol without IV contrast. RADIATION DOSE REDUCTION: This exam was performed according to the departmental dose-optimization program which includes automated exposure control, adjustment of the mA and/or kV according to patient size and/or use of iterative reconstruction technique. COMPARISON:  CT abdomen and pelvis 02/26/2022 FINDINGS: Lower chest: There is some chronic interstitial opacities in both lung bases similar to prior. There is a small pericardial effusion, similar to prior. There is also trace left pleural effusion. Hepatobiliary: No focal liver abnormality is seen. Status post cholecystectomy. No biliary dilatation. Pancreas: Unremarkable. No pancreatic ductal dilatation or surrounding inflammatory changes. Spleen: Normal in size without focal abnormality. Adrenals/Urinary Tract: There are 3 punctate calculi in the superior pole the right kidney measuring 3 mm each. There is a cyst in the superior pole the right kidney measuring 4 cm. There is no hydronephrosis or perinephric fluid collection. No ureteral or bladder calculi are seen. The bladder and adrenal glands are within normal limits. Stomach/Bowel: Stomach is within normal limits. No evidence of bowel wall thickening, distention, or inflammatory changes. There is diffuse colonic diverticulosis. The appendix is not visualized. Vascular/Lymphatic: Aortic atherosclerosis. No enlarged abdominal or pelvic lymph nodes. Reproductive: Prostate is unremarkable. Other: There is a small fat containing umbilical hernia. No ascites. There is some subcutaneous density just beneath the skin surface in the upper left anterior abdominal wall which is unchanged, possibly scarring. Musculoskeletal: Degenerative changes affect the spine. IMPRESSION: 1.  Nonobstructing right renal calculi. No hydronephrosis or ureteral calculus. 2. Colonic diverticulosis without evidence for diverticulitis. 3. Stable small pericardial effusion. 4. Trace left pleural effusion. 5. Right Bosniak II benign renal cyst measuring 4 cm. No follow-up imaging is recommended. JACR 2018 Feb; 264-273, Management of the Incidental Renal Mass on CT, [employer] 2021; 814-848, Bosniak Classification of Cystic Renal Masses, Version 2019. Aortic Atherosclerosis (ICD10-I70.0). Electronically Signed   By: Ronney Asters M.D.   On: 05/23/2022 15:40     Assessment and Recommendation  81 y.o. male with known sleep apnea moderate atherosclerosis of coronary arteries and chronic kidney disease stage IV with progression volume overload causing pulmonary edema hypoxia shortness of breath and chest pain without evidence of acute coronary syndrome 1.  Continue intravenous Lasix today for further urine output and improvements of pulmonary edema and lower extremity edema likely for 1 more day watching closely for worsening glomerular filtration rate 2.  Continue treatment options as per nephrology for further evaluation and treatment options kidney dysfunction for   short and long-term treatment of volume overload 3.  No further treatment for chest pain likely secondary to pulmonary edema and hypoxia rather than acute coronary syndrome.   4.  No further cardiac diagnostic center intervention at this time until further evaluation from nephrology 5.  Continuation of antihypertensive medication management and high intensity cholesterol therapy as before  Signed, Serafina Royals M.D. FACC

## 2022-06-15 DIAGNOSIS — N184 Chronic kidney disease, stage 4 (severe): Secondary | ICD-10-CM | POA: Diagnosis not present

## 2022-06-15 DIAGNOSIS — I5033 Acute on chronic diastolic (congestive) heart failure: Secondary | ICD-10-CM | POA: Diagnosis not present

## 2022-06-15 LAB — BASIC METABOLIC PANEL
Anion gap: 11 (ref 5–15)
BUN: 70 mg/dL — ABNORMAL HIGH (ref 8–23)
CO2: 26 mmol/L (ref 22–32)
Calcium: 8.3 mg/dL — ABNORMAL LOW (ref 8.9–10.3)
Chloride: 102 mmol/L (ref 98–111)
Creatinine, Ser: 3.55 mg/dL — ABNORMAL HIGH (ref 0.61–1.24)
GFR, Estimated: 17 mL/min — ABNORMAL LOW (ref >60)
Glucose, Bld: 208 mg/dL — ABNORMAL HIGH (ref 70–99)
Potassium: 3.4 mmol/L — ABNORMAL LOW (ref 3.5–5.1)
Sodium: 139 mmol/L (ref 135–145)

## 2022-06-15 LAB — CBC
HCT: 30.6 % — ABNORMAL LOW (ref 39.0–52.0)
Hemoglobin: 9.9 g/dL — ABNORMAL LOW (ref 13.0–17.0)
MCH: 28.8 pg (ref 26.0–34.0)
MCHC: 32.4 g/dL (ref 30.0–36.0)
MCV: 89 fL (ref 80.0–100.0)
Platelets: 271 10*3/uL (ref 150–400)
RBC: 3.44 MIL/uL — ABNORMAL LOW (ref 4.22–5.81)
RDW: 13.6 % (ref 11.5–15.5)
WBC: 9.5 10*3/uL (ref 4.0–10.5)
nRBC: 0 % (ref 0.0–0.2)

## 2022-06-15 LAB — GLUCOSE, CAPILLARY
Glucose-Capillary: 134 mg/dL — ABNORMAL HIGH (ref 70–99)
Glucose-Capillary: 287 mg/dL — ABNORMAL HIGH (ref 70–99)
Glucose-Capillary: 227 mg/dL — ABNORMAL HIGH (ref 70–99)
Glucose-Capillary: 153 mg/dL — ABNORMAL HIGH (ref 70–99)

## 2022-06-15 MED ORDER — FUROSEMIDE 40 MG PO TABS
80.0000 mg | ORAL_TABLET | Freq: Two times a day (BID) | ORAL | Status: DC
  Administered 2022-06-15 – 2022-06-17 (×5): 80 mg via ORAL
  Filled 2022-06-15 (×5): qty 2

## 2022-06-15 MED ORDER — POTASSIUM CHLORIDE 20 MEQ PO PACK
20.0000 meq | PACK | Freq: Every day | ORAL | Status: AC
  Administered 2022-06-15 – 2022-06-17 (×3): 20 meq via ORAL
  Filled 2022-06-15 (×3): qty 1

## 2022-06-15 NOTE — Progress Notes (Signed)
Mobility Specialist - Progress Note   06/15/22 1453  Mobility  Activity Ambulated with assistance in hallway;Stood at bedside;Dangled on edge of bed  Level of Assistance Standby assist, set-up cues, supervision of patient - no hands on  Assistive Device Front wheel walker  Distance Ambulated (ft) 160 ft  Activity Response Tolerated well  Mobility Referral Yes  $Mobility charge 1 Mobility   PT supine in bed on RA upon arrival. Pt STS and ambulates 1 lap around NS SBA. Pt complains of SOB at end of ambulation (165f). RN notified. Pt returns to bed with needs in reach and RN entering room.   BGretchen Short Mobility Specialist  06/15/22 2:54 PM

## 2022-06-15 NOTE — Plan of Care (Signed)

## 2022-06-15 NOTE — TOC Initial Note (Addendum)
Transition of Care [facility]) - Initial/Assessment Note    Patient Details  Name: James Clarke MRN: [id number] Date of Birth: [birth year]  Transition of Care [facility]) CM/SW Contact:    Magnus Ivan, LCSW Phone Number: 06/15/2022, 10:45 AM  Clinical Narrative:                 CSW spoke with patient's spouse Delores via phone. Patient lives with his wife. Patient drives himself when able, wife is also able to provide transport when needed. PCP is Dr. Ellison Hughs. Pharmacy is [facility].  Patient has a RW, cane, BSC, grab bars, lift chair, and adjustable bed at home.  No SNF history.  CSW explained PT/OT recs for [facility]. Delores stated they are agreeable to [facility] and would like to use [facility] if possible as they have used them in the past. CSW left secure VM for Meg with [facility] to inquire if they can take patient again, waiting on response.   1:40- Received message from [facility] with [facility] who confirmed they can take patient for [facility]. Asked MD for orders.   Expected Discharge Plan: [place] Barriers to Discharge: Continued Medical Work up   Patient Goals and CMS Choice Patient states their goals for this hospitalization and ongoing recovery are:: home with home health CMS Medicare.gov Compare Post Acute Care list provided to:: Patient Represenative (must comment) Choice offered to / list presented to : Spouse  Expected Discharge Plan and Services Expected Discharge Plan: [place]       Living arrangements for the past 2 months: Single Family Home                           HH Arranged: PT, OT       Representative spoke with at [place]: left VM for Meg with [facility], waiting on confirmation  Prior Living Arrangements/Services Living arrangements for the past 2 months: Single Family Home Lives with:: Spouse Patient language and need for interpreter reviewed:: Yes Do you feel safe going back to the place where you live?: Yes       Need for Family Participation in Patient Care: Yes (Comment) Care giver support system in place?: Yes (comment) Current home services: DME Criminal Activity/Legal Involvement Pertinent to Current Situation/Hospitalization: No - Comment as needed  Activities of Daily Living Home Assistive Devices/Equipment: CPAP, Eyeglasses ADL Screening (condition at time of admission) Patient's cognitive ability adequate to safely complete daily activities?: Yes Is the patient deaf or have difficulty hearing?: No Does the patient have difficulty seeing, even when wearing glasses/contacts?: No Does the patient have difficulty concentrating, remembering, or making decisions?: No Patient able to express need for assistance with ADLs?: Yes Does the patient have difficulty dressing or bathing?: No Independently performs ADLs?: Yes (appropriate for developmental age) Does the patient have difficulty walking or climbing stairs?: Yes Weakness of Legs: Both Weakness of Arms/Hands: None  Permission Sought/Granted Permission sought to share information with : Chartered certified accountant granted to share information with : Yes, Verbal Permission Granted (by spouse)     Permission granted to share info w AGENCY: [facility]        Emotional Assessment         Alcohol / Substance Use: Not Applicable Psych Involvement: No (comment)  Admission diagnosis:  Acute on chronic diastolic CHF (congestive heart failure) (HCC) [I50.33] Dyspnea, unspecified type [R06.00] Acute on chronic congestive heart failure,  unspecified heart failure type [facility]) [I50.9] Patient Active Problem List   Diagnosis Date Noted   Acute on chronic diastolic CHF (congestive heart failure) ([place]) 06/12/2022   Chest pain at rest 06/12/2022   Swelling of limb 04/30/2022   Obstructive uropathy 03/10/2022   Diarrhea 03/04/2022   Skin irritation 03/01/2022   UTI (urinary tract infection) 02/28/2022   Hydronephrosis of left  kidney    Flank pain 02/25/2022   Chronic diastolic CHF (congestive heart failure) ([place]) 02/25/2022   Acute renal failure superimposed on chronic kidney disease ([place]) 02/25/2022   Right rotator cuff tear arthropathy 06/25/2021   Chronic right shoulder pain 06/25/2021   Localized osteoarthritis of right shoulder 06/25/2021   Chronic pain syndrome 22/29/7989   Acute systolic CHF (congestive heart failure) ([place]) 10/03/2020   Kidney stones 02/16/2020   Anemia in chronic kidney disease 07/26/2019   Chronic kidney disease, stage III (moderate) ([place]) 07/26/2019   Edema of lower extremity 07/26/2019   Malignant essential hypertension 07/26/2019   Secondary hyperparathyroidism of renal origin ([place]) 07/26/2019   Left ureteral calculus 07/26/2019   Old complex tear of medial meniscus of right knee 10/07/2018   Primary osteoarthritis of right knee 10/07/2018   Fatty liver 04/16/2018   Aortic atherosclerosis ([place]) 04/16/2018   Morbid obesity due to excess calories ([place]) 11/11/2014   Microalbuminuria 11/11/2014   Long-term insulin use ([place]) 11/11/2014   Perennial allergic rhinitis 09/12/2014   Type 2 diabetes mellitus with renal manifestations ([place]) 03/19/2014   Sleep apnea 03/19/2014   Hypertension 03/19/2014   Hyperlipemia 03/19/2014   PCP:  Sofie Hartigan, MD Pharmacy:   [place], [place] [address] [address] Phone: [phone] Fax: [place], [address] [place] [address] Phone: [phone] Fax: [place], [place] [facility] [address] [place] [facility] [place] [address] Phone: [phone] Fax: [phone]     Social Determinants of Health (SDOH) Interventions    Readmission Risk Interventions    02/27/2022    4:22 PM  Readmission Risk Prevention Plan  Transportation Screening  Complete  HRI or Home Care Consult Complete  Palliative Care Screening Not Applicable  Medication Review (RN Care Manager) Complete

## 2022-06-15 NOTE — Progress Notes (Signed)
[facility] Cardiology [facility] Encounter Note  Patient: Oralia Manis / Admit Date: 06/12/2022 / Date of Encounter: 06/15/2022, 5:39 AM   Subjective: 11/23.  Patient had episode of chest discomfort last night not particularly relieved by nitroglycerin.  Patient's oxygenation may have helped with improvements of symptoms.  Troponin levels have been 61/78/82 more consistent with volume overload and pulmonary edema rather than acute coronary syndrome.  EKG was unchanged from admission with nonspecific ST changes.  The patient has had 2 L of urine output which has improved his current condition although primary issue appears to be chronic kidney disease with stage IV.  11/24.  Patient has had significant amount of urine output with significant improvements of symptoms.  He has much less lower extremity edema and improved pulmonary edema with Rales.  The patient is able to ambulate more and with no evidence of chest discomfort or severe shortness of breath.  11/25.  Patient continues to have reasonable urine output now over 6-1/2 L with intravenous Lasix which is improving his overall status.  Glomerular filtration rate still reasonable for him.  No evidence of cardiovascular symptoms at this time  Echocardiogram earlier this year showed normal LV systolic function and no evidence of significant valvular heart disease  Cardiac catheterization showed moderate atherosclerosis of 3 vessels without evidence of need for intervention  Review of Systems: Positive for: Shortness of breath and no chest pain Negative for: Vision change, hearing change, syncope, dizziness, nausea, vomiting,diarrhea, bloody stool, stomach pain, cough, congestion, diaphoresis, urinary frequency, urinary pain,skin lesions, skin rashes Others previously listed  Objective: Telemetry: Normal sinus rhythm Physical Exam: Blood pressure 136/77, pulse 73, temperature 98 F (36.7 C), temperature source Oral, resp. rate 16,  height '5\' 10"'$  (1.778 m), weight (!) 144.3 kg, SpO2 93 %. Body mass index is 45.65 kg/m. General: Well developed, well nourished, in no acute distress. Head: Normocephalic, atraumatic, sclera non-icteric, no xanthomas, nares are without discharge. Neck: No apparent masses Lungs: Normal respirations with no wheezes, no rhonchi, basilar rales , no crackles   Heart: Regular rate and rhythm, normal S1 S2, no murmur, no rub, no gallop, PMI is normal size and placement, carotid upstroke normal without bruit, jugular venous pressure normal Abdomen: Soft, non-tender, non-distended with normoactive bowel sounds. No hepatosplenomegaly. Abdominal aorta is normal size without bruit Extremities: 1+'s edema, no clubbing, no cyanosis, no ulcers,  Peripheral: 2+ radial, 2+ femoral, 2+ dorsal pedal pulses Neuro: Alert and oriented. Moves all extremities spontaneously. Psych:  Responds to questions appropriately with a normal affect.   Intake/Output Summary (Last 24 hours) at 06/15/2022 0539 Last data filed at 06/15/2022 0030 Gross per 24 hour  Intake 420 ml  Output 3250 ml  Net -2830 ml     Inpatient Medications:   amLODipine  10 mg Oral Daily   aspirin EC  81 mg Oral Daily   calcitRIOL  0.25 mcg Oral Daily   furosemide  80 mg Oral BID   heparin  5,000 Units Subcutaneous Q8H   hydrALAZINE  50 mg Oral TID   insulin aspart  0-20 Units Subcutaneous TID WC   insulin detemir  50 Units Subcutaneous QHS   isosorbide mononitrate  30 mg Oral Daily   metoprolol succinate  50 mg Oral Daily   potassium citrate  10 mEq Oral BID   pravastatin  40 mg Oral q1800   sodium chloride flush  3 mL Intravenous Q12H   tamsulosin  0.4 mg Oral Daily   Infusions:   sodium  chloride      Labs: Recent Labs    06/13/22 0025 06/13/22 0240 06/14/22 0447 06/15/22 0445  NA  --    < > 142 139  K  --    < > 3.8 3.4*  CL  --    < > 105 102  CO2  --    < > 28 26  GLUCOSE  --    < > 193* 208*  BUN  --    < > 67* 70*   CREATININE  --    < > 3.41* 3.55*  CALCIUM  --    < > 8.3* 8.3*  MG 2.1  --   --   --    < > = values in this interval not displayed.    No results for input(s): "AST", "ALT", "ALKPHOS", "BILITOT", "PROT", "ALBUMIN" in the last 72 hours. Recent Labs    06/14/22 0447 06/15/22 0445  WBC 10.9* 9.5  HGB 10.2* 9.9*  HCT 32.1* 30.6*  MCV 89.7 89.0  PLT 269 271    No results for input(s): "CKTOTAL", "CKMB", "TROPONINI" in the last 72 hours. Invalid input(s): "POCBNP" No results for input(s): "HGBA1C" in the last 72 hours.   Weights: Filed Weights   06/12/22 1248 06/12/22 2004 06/14/22 0400  Weight: (!) 147 kg (!) 143.3 kg (!) 144.3 kg     Radiology/Studies:  DG Chest 1 View  Result Date: 06/14/2022 CLINICAL DATA:  Shortness of breath EXAM: CHEST  1 VIEW COMPARISON:  06/12/2022 and prior studies FINDINGS: The cardiomediastinal silhouette is unchanged. Mild interstitial opacities are again noted as well as mild bibasilar opacities/atelectasis. There is no evidence of pneumothorax or acute bony abnormality. There has been little interval change since prior study. IMPRESSION: Unchanged appearance of the chest with mild interstitial opacities and mild bibasilar opacities/atelectasis. Electronically Signed   By: Margarette Canada M.D.   On: 06/14/2022 08:46   CT CHEST WO CONTRAST  Result Date: 06/12/2022 CLINICAL DATA:  Chest pain and shortness of breath. EXAM: CT CHEST WITHOUT CONTRAST TECHNIQUE: Multidetector CT imaging of the chest was performed following the standard protocol without IV contrast. RADIATION DOSE REDUCTION: This exam was performed according to the departmental dose-optimization program which includes automated exposure control, adjustment of the mA and/or kV according to patient size and/or use of iterative reconstruction technique. COMPARISON:  CT scan from 2015. FINDINGS: Cardiovascular: The heart is normal in size for age. Small pericardial effusion noted. The aorta is  normal in caliber. Scattered atherosclerotic calcifications. Extensive three-vessel coronary artery calcifications. Mediastinum/Nodes: Scattered borderline mediastinal and hilar lymph nodes, likely reactive. The esophagus is grossly normal. Lungs/Pleura: Small bilateral pleural effusions with overlying atelectasis. Changes of interstitial lung disease with peripheral subpleural reticulations, thickening of the interlobular septi, faint nodularity and extensive lower lobe predominant parenchymal calcifications. This could be due to chronic inflammation, previous infection, aspiration and less likely pulmonary alveolar microlithiasis. Patchy nodular ground-glass opacities in the right lower lobe may suggest a more acute inflammatory process and aspiration or bronchopneumonia are possibilities. Upper Abdomen: No significant upper abdominal findings. No hepatic or adrenal gland lesions. No upper abdominal adenopathy. Scattered vascular calcifications. Musculoskeletal: No chest wall mass, supraclavicular or axillary adenopathy. The bony thorax is intact. IMPRESSION: 1. Changes of interstitial lung disease as detailed above. 2. Stable lower lobe predominant parenchymal lung calcifications as above. 3. Patchy nodular ground-glass opacities in the right lower lobe may suggest a more acute inflammatory process and aspiration or bronchopneumonia are possibilities. 4. Small bilateral  pleural effusions with overlying atelectasis. 5. Small pericardial effusion. 6. Extensive three-vessel coronary artery calcifications. Electronically Signed   By: Marijo Sanes M.D.   On: 06/12/2022 16:42   DG Chest 2 View  Result Date: 06/12/2022 CLINICAL DATA:  Chest pain and shortness of breath since last night, slept in recliner with CPAP all night EXAM: CHEST - 2 VIEW COMPARISON:  09/19/2009 FINDINGS: Enlargement of cardiac silhouette. Mediastinal contours and pulmonary vascularity normal. Interstitial infiltrates at the mid to lower  lungs question edema. Small LEFT pleural effusion. No upper lobe infiltrate or pneumothorax. Osseous structures demineralized. IMPRESSION: Enlargement of cardiac silhouette with bibasilar infiltrates question pulmonary edema. Small associated LEFT pleural effusion. Electronically Signed   By: Lavonia Dana M.D.   On: 06/12/2022 13:21   CT RENAL STONE STUDY  Result Date: 05/23/2022 CLINICAL DATA:  Flank pain. EXAM: CT ABDOMEN AND PELVIS WITHOUT CONTRAST TECHNIQUE: Multidetector CT imaging of the abdomen and pelvis was performed following the standard protocol without IV contrast. RADIATION DOSE REDUCTION: This exam was performed according to the departmental dose-optimization program which includes automated exposure control, adjustment of the mA and/or kV according to patient size and/or use of iterative reconstruction technique. COMPARISON:  CT abdomen and pelvis 02/26/2022 FINDINGS: Lower chest: There is some chronic interstitial opacities in both lung bases similar to prior. There is a small pericardial effusion, similar to prior. There is also trace left pleural effusion. Hepatobiliary: No focal liver abnormality is seen. Status post cholecystectomy. No biliary dilatation. Pancreas: Unremarkable. No pancreatic ductal dilatation or surrounding inflammatory changes. Spleen: Normal in size without focal abnormality. Adrenals/Urinary Tract: There are 3 punctate calculi in the superior pole the right kidney measuring 3 mm each. There is a cyst in the superior pole the right kidney measuring 4 cm. There is no hydronephrosis or perinephric fluid collection. No ureteral or bladder calculi are seen. The bladder and adrenal glands are within normal limits. Stomach/Bowel: Stomach is within normal limits. No evidence of bowel wall thickening, distention, or inflammatory changes. There is diffuse colonic diverticulosis. The appendix is not visualized. Vascular/Lymphatic: Aortic atherosclerosis. No enlarged abdominal or  pelvic lymph nodes. Reproductive: Prostate is unremarkable. Other: There is a small fat containing umbilical hernia. No ascites. There is some subcutaneous density just beneath the skin surface in the upper left anterior abdominal wall which is unchanged, possibly scarring. Musculoskeletal: Degenerative changes affect the spine. IMPRESSION: 1. Nonobstructing right renal calculi. No hydronephrosis or ureteral calculus. 2. Colonic diverticulosis without evidence for diverticulitis. 3. Stable small pericardial effusion. 4. Trace left pleural effusion. 5. Right Bosniak II benign renal cyst measuring 4 cm. No follow-up imaging is recommended. JACR 2018 Feb; 264-273, Management of the Incidental Renal Mass on CT, [employer] 2021; [id number], Bosniak Classification of Cystic Renal Masses, Version 2019. Aortic Atherosclerosis (ICD10-I70.0). Electronically Signed   By: Ronney Asters M.D.   On: 05/23/2022 15:40     Assessment and Recommendation  81 y.o. male with known sleep apnea moderate atherosclerosis of coronary arteries and chronic kidney disease stage IV with progression volume overload causing pulmonary edema hypoxia shortness of breath and chest pain without evidence of acute coronary syndrome 1.  Continue intravenous Lasix today for further urine output and improvements of pulmonary edema and lower extremity edema likely for 1 more day watching closely for worsening glomerular filtration rate but as per nephrology 2.  Continue treatment options as per nephrology for further evaluation and treatment options kidney dysfunction for   short and long-term treatment of volume overload  3.  No further treatment for chest pain likely secondary to pulmonary edema and hypoxia rather than acute coronary syndrome.   4.  No further cardiac diagnostic center intervention at this time until further evaluation from nephrology 5.  Continuation of antihypertensive medication management and high intensity cholesterol therapy as  before  Signed, Serafina Royals M.D. FACC

## 2022-06-15 NOTE — Progress Notes (Signed)
PROGRESS NOTE   HPI was taken from Dr. Francine Graven: James Clarke is a 81 y.o. male with medical history significant for diabetes mellitus with complications of stage IV chronic kidney disease, morbid obesity (BMI 46.49 kg/m2), hypertension, obstructive sleep apnea, history of nephrolithiasis, chronic diastolic dysfunction CHF with last known LVEF of 55 to 60% from 08/23 who presents to the ER via EMS for evaluation of worsening shortness of breath.  Patient states that he has had shortness of breath for several days but worse 1 day prior to his admission.  He states that he slept in his recliner with his CPAP because he could not breathe laying flat.  He also complains of midsternal chest pain which he describes as feeling like someone is sitting on his chest.  He rates it a 7 x 10 in intensity at its worst.  It is nonradiating and he denies having any nausea, no vomiting, no palpitations or diaphoresis.  He has mild lower extremity swelling.  He has been taking his Lasix as prescribed every other day but admits to drinking too much fluids.  His wife notes that his urine output has reduced despite taking the diuretics. He denies having any cough, no fever, no chills, no headache, no abdominal pain, no changes in his bowel habits, no dizziness, no lightheadedness or blurred vision. Labs show elevated BNP level Chest x-ray reviewed by me shows enlargement of cardiac silhouette with bibasilar infiltrates question pulmonary edema. Small associated LEFT pleural effusion Twelve-lead EKG reviewed by me shows normal sinus rhythm with sinus arrhythmia.  Nonspecific ST and T wave changes Patient received Lasix 60 mg IV in the ER and will be admitted to the hospital for further evaluation     James Clarke  HEN:[id number] DOB: 1941/06/28 DOA: 06/12/2022 PCP: Sofie Hartigan, MD   Assessment & Plan:   Principal Problem:   Acute on chronic diastolic CHF (congestive heart failure) ([place]) Active  Problems:   Type 2 diabetes mellitus with renal manifestations ([place])   Hypertension   Sleep apnea   Morbid obesity due to excess calories (HCC)   Chest pain at rest  Assessment and Plan: Acute on chronic diastolic CHF: continue on lasix. Monitor I/Os. Neg approx 2.7L today so far. Worsening shortness of breath associated with orthopnea and bilateral lower extremity swelling on admission. Echo from 08/23 showed an EF of 55 to 60% with grade 2 diastolic dysfunction. Continue on metoprolol, amlodipine, imdur, statin as per cardio. Cardio following and recs apprec   DM2: likely poorly controlled. Continue on levemir, SSI w/ accuchecks   CKDIV: Cr is trending up likely secondary to lasix use. Nephro following and recs apprec   HTN: continue on metoprolol, amlodipine, hydralazine & imdur   OSA: CPAP qhs     Morbid obesity: BMI 46.7. Complicates overall care & prognosis    Chest pain: at rest. W/ minimally elevated troponins, likely secondary to demand ischemia. Continue on imdur as per cardio       DVT prophylaxis:  heparin  Code Status: full Family Communication: discussed pt's care w/ pt's family at bedside and answered their questions  Disposition Plan: unclear  Level of care: Progressive  Status is: Inpatient Remains inpatient appropriate because: severity of illness   Consultants:  Nephro   Procedures:   Antimicrobials:    Subjective: Pt c/o shortness of breath w/ exertion    Objective: Vitals:   06/14/22 2346 06/15/22 0402 06/15/22 0600 06/15/22 0803  BP: 133/62 136/77  137/69  Pulse: 75 73  72  Resp: '15 16  16  '$ Temp: (!) 97.3 F (36.3 C) 98 F (36.7 C)  (!) 97.5 F (36.4 C)  TempSrc:  Oral    SpO2: 96% 93%  99%  Weight:   (!) 147.7 kg   Height:        Intake/Output Summary (Last 24 hours) at 06/15/2022 1345 Last data filed at 06/15/2022 9242 Gross per 24 hour  Intake 660 ml  Output 3400 ml  Net -2740 ml   Filed Weights   06/12/22 2004 06/14/22  0400 06/15/22 0600  Weight: (!) 143.3 kg (!) 144.3 kg (!) 147.7 kg    Examination:  General exam: Appears calm & comfortable   Respiratory system: decreased breath sounds b/l  Cardiovascular system: S1 & S2+. No rubs or clicks  Gastrointestinal system: Abd is soft, NT, obese & hypoactive bowel sounds  Central nervous system: alert and oriented. Moves all extremities   Psychiatry: judgement and insight appears normal. Appropriate mood and affect    Data Reviewed: I have personally reviewed following labs and imaging studies  CBC: Recent Labs  Lab 06/12/22 1259 06/13/22 0240 06/14/22 0447 06/15/22 0445  WBC 11.8* 10.8* 10.9* 9.5  HGB 11.7* 10.7* 10.2* 9.9*  HCT 36.7* 33.4* 32.1* 30.6*  MCV 89.7 89.1 89.7 89.0  PLT 307 265 269 683   Basic Metabolic Panel: Recent Labs  Lab 06/12/22 1259 06/13/22 0025 06/13/22 0240 06/14/22 0447 06/15/22 0445  NA 140  --  140 142 139  K 4.3  --  3.9 3.8 3.4*  CL 108  --  107 105 102  CO2 25  --  '27 28 26  '$ GLUCOSE 184*  --  178* 193* 208*  BUN 56*  --  57* 67* 70*  CREATININE 2.90*  --  3.02* 3.41* 3.55*  CALCIUM 8.9  --  8.5* 8.3* 8.3*  MG  --  2.1  --   --   --    GFR: Estimated Creatinine Clearance: 23.8 mL/min (A) (by C-G formula based on SCr of 3.55 mg/dL (H)). Liver Function Tests: No results for input(s): "AST", "ALT", "ALKPHOS", "BILITOT", "PROT", "ALBUMIN" in the last 168 hours. No results for input(s): "LIPASE", "AMYLASE" in the last 168 hours. No results for input(s): "AMMONIA" in the last 168 hours. Coagulation Profile: No results for input(s): "INR", "PROTIME" in the last 168 hours. Cardiac Enzymes: No results for input(s): "CKTOTAL", "CKMB", "CKMBINDEX", "TROPONINI" in the last 168 hours. BNP (last 3 results) No results for input(s): "PROBNP" in the last 8760 hours. HbA1C: No results for input(s): "HGBA1C" in the last 72 hours. CBG: Recent Labs  Lab 06/14/22 1203 06/14/22 1640 06/14/22 2040 06/15/22 0803  06/15/22 1316  GLUCAP 260* 168* 269* 134* 287*   Lipid Profile: No results for input(s): "CHOL", "HDL", "LDLCALC", "TRIG", "CHOLHDL", "LDLDIRECT" in the last 72 hours. Thyroid Function Tests: No results for input(s): "TSH", "T4TOTAL", "FREET4", "T3FREE", "THYROIDAB" in the last 72 hours. Anemia Panel: No results for input(s): "VITAMINB12", "FOLATE", "FERRITIN", "TIBC", "IRON", "RETICCTPCT" in the last 72 hours. Sepsis Labs: No results for input(s): "PROCALCITON", "LATICACIDVEN" in the last 168 hours.  Recent Results (from the past 240 hour(s))  Resp Panel by RT-PCR (Flu A&B, Covid) Anterior Nasal Swab     Status: None   Collection Time: 06/12/22  2:20 PM   Specimen: Anterior Nasal Swab  Result Value Ref Range Status   SARS Coronavirus 2 by RT PCR NEGATIVE NEGATIVE Final    Comment: (NOTE) SARS-CoV-2  target nucleic acids are NOT DETECTED.  The SARS-CoV-2 RNA is generally detectable in upper respiratory specimens during the acute phase of infection. The lowest concentration of SARS-CoV-2 viral copies this assay can detect is 138 copies/mL. A negative result does not preclude SARS-Cov-2 infection and should not be used as the sole basis for treatment or other patient management decisions. A negative result may occur with  improper specimen collection/handling, submission of specimen other than nasopharyngeal swab, presence of viral mutation(s) within the areas targeted by this assay, and inadequate number of viral copies(<138 copies/mL). A negative result must be combined with clinical observations, patient history, and epidemiological information. The expected result is Negative.  Fact Sheet for Patients:  EntrepreneurPulse.com.au  Fact Sheet for Healthcare Providers:  IncredibleEmployment.be  This test is no t yet approved or cleared by the [place] FDA and  has been authorized for detection and/or diagnosis of SARS-CoV-2 by FDA under  an Emergency Use Authorization (EUA). This EUA will remain  in effect (meaning this test can be used) for the duration of the COVID-19 declaration under Section 564(b)(1) of the Act, 21 U.S.C.section 360bbb-3(b)(1), unless the authorization is terminated  or revoked sooner.       Influenza A by PCR NEGATIVE NEGATIVE Final   Influenza B by PCR NEGATIVE NEGATIVE Final    Comment: (NOTE) The Xpert Xpress SARS-CoV-2/FLU/RSV plus assay is intended as an aid in the diagnosis of influenza from Nasopharyngeal swab specimens and should not be used as a sole basis for treatment. Nasal washings and aspirates are unacceptable for Xpert Xpress SARS-CoV-2/FLU/RSV testing.  Fact Sheet for Patients: EntrepreneurPulse.com.au  Fact Sheet for Healthcare Providers: IncredibleEmployment.be  This test is not yet approved or cleared by the [place] FDA and has been authorized for detection and/or diagnosis of SARS-CoV-2 by FDA under an Emergency Use Authorization (EUA). This EUA will remain in effect (meaning this test can be used) for the duration of the COVID-19 declaration under Section 564(b)(1) of the Act, 21 U.S.C. section 360bbb-3(b)(1), unless the authorization is terminated or revoked.  Performed at [facility], [address], [place], [place] [address]          Radiology Studies: DG Chest 1 View  Result Date: 06/14/2022 CLINICAL DATA:  Shortness of breath EXAM: CHEST  1 VIEW COMPARISON:  06/12/2022 and prior studies FINDINGS: The cardiomediastinal silhouette is unchanged. Mild interstitial opacities are again noted as well as mild bibasilar opacities/atelectasis. There is no evidence of pneumothorax or acute bony abnormality. There has been little interval change since prior study. IMPRESSION: Unchanged appearance of the chest with mild interstitial opacities and mild bibasilar opacities/atelectasis. Electronically Signed   By:  Margarette Canada M.D.   On: 06/14/2022 08:46        Scheduled Meds:  amLODipine  10 mg Oral Daily   aspirin EC  81 mg Oral Daily   calcitRIOL  0.25 mcg Oral Daily   furosemide  80 mg Oral BID   heparin  5,000 Units Subcutaneous Q8H   hydrALAZINE  50 mg Oral TID   insulin aspart  0-20 Units Subcutaneous TID WC   insulin detemir  50 Units Subcutaneous QHS   isosorbide mononitrate  30 mg Oral Daily   metoprolol succinate  50 mg Oral Daily   potassium chloride  20 mEq Oral Daily   pravastatin  40 mg Oral q1800   sodium chloride flush  3 mL Intravenous Q12H   tamsulosin  0.4 mg Oral Daily   Continuous  Infusions:  sodium chloride       LOS: 3 days    Time spent: 25 mins     Wyvonnia Dusky, MD [facility] Pager [phone]  If 7PM-7AM, please contact night-coverage [email or web address] 06/15/2022, 1:45 PM

## 2022-06-15 NOTE — Progress Notes (Signed)
James Clarke  MRN: [id number]  DOB/AGE: September 20, 1940 81 y.o.  Primary Care Physician:Feldpausch, Chrissie Noa, MD  Admit date: 06/12/2022  Chief Complaint:  Chief Complaint  Patient presents with   Shortness of Breath    S-Pt presented on  06/12/2022 with  Chief Complaint  Patient presents with   Shortness of Breath  .  Patient is 81 year old Caucasian male with a past medical history diabetes mellitus type 2, CKD stage IV, morbid obesity, hypertension, obstructive sleep apnea, nephrolithiasis, chronic diastolic CHF who came to the ER with chief complaint of shortness of breath. Upon evaluation in the ER patient was found to be in fluid overload and was admitted. Patient is well-known to nephrology practice. Patient follows up with Dr. Holley Raring as an outpatient.  Patient last visit was on April 23, 2022 with a plan to follow-up in a month.  Patient was seen today on second floor. Patient wife was present in the room.  Patient wife's main complaint was about how good patient urine output has been.  Patient main complaint was that he was feeling better than before   Medications  amLODipine  10 mg Oral Daily   aspirin EC  81 mg Oral Daily   calcitRIOL  0.25 mcg Oral Daily   furosemide  80 mg Oral BID   heparin  5,000 Units Subcutaneous Q8H   hydrALAZINE  50 mg Oral TID   insulin aspart  0-20 Units Subcutaneous TID WC   insulin detemir  50 Units Subcutaneous QHS   isosorbide mononitrate  30 mg Oral Daily   metoprolol succinate  50 mg Oral Daily   potassium chloride  20 mEq Oral Daily   pravastatin  40 mg Oral q1800   sodium chloride flush  3 mL Intravenous Q12H   tamsulosin  0.4 mg Oral Daily         DPO:EUMPN from the symptoms mentioned above,there are no other symptoms referable to all systems reviewed.  Physical Exam: Vital signs in last 24 hours: Temp:  [97.3 F (36.3 C)-98 F (36.7 C)] 97.5 F (36.4 C) (11/25 0803) Pulse Rate:  [72-77] 72 (11/25 0803) Resp:   [15-20] 16 (11/25 0803) BP: (133-145)/(61-77) 137/69 (11/25 0803) SpO2:  [93 %-99 %] 99 % (11/25 0803) Weight:  [147.7 kg] 147.7 kg (11/25 0600) Weight change: 3.4 kg Last BM Date : 06/13/22  Intake/Output from previous day: 11/24 0701 - 11/25 0700 In: 660 [P.O.:660] Out: 3750 [Urine:3750] Total I/O In: 240 [P.O.:240] Out: -    Physical Exam:  General- pt is awake,alert, oriented to time place and person  HEENT-head is atraumatic normocephalic sclera is anicteric  Resp- No acute REsp distress, Decreased breath sounds at bases, Minimal crackles  CVS- S1S2 regular in rate and rhythm  GIT- BS+, soft, Non tender , Non distended  EXT- trace LE Edema,  No Cyanosis    Lab Results:  CBC  Recent Labs    06/14/22 0447 06/15/22 0445  WBC 10.9* 9.5  HGB 10.2* 9.9*  HCT 32.1* 30.6*  PLT 269 271    BMET  Recent Labs    06/14/22 0447 06/15/22 0445  NA 142 139  K 3.8 3.4*  CL 105 102  CO2 28 26  GLUCOSE 193* 208*  BUN 67* 70*  CREATININE 3.41* 3.55*  CALCIUM 8.3* 8.3*      Most recent Creatinine trend  Lab Results  Component Value Date   CREATININE 3.55 (H) 06/15/2022   CREATININE 3.41 (H) 06/14/2022   CREATININE 3.02 (  H) 06/13/2022      MICRO   Recent Results (from the past 240 hour(s))  Resp Panel by RT-PCR (Flu A&B, Covid) Anterior Nasal Swab     Status: None   Collection Time: 06/12/22  2:20 PM   Specimen: Anterior Nasal Swab  Result Value Ref Range Status   SARS Coronavirus 2 by RT PCR NEGATIVE NEGATIVE Final    Comment: (NOTE) SARS-CoV-2 target nucleic acids are NOT DETECTED.  The SARS-CoV-2 RNA is generally detectable in upper respiratory specimens during the acute phase of infection. The lowest concentration of SARS-CoV-2 viral copies this assay can detect is 138 copies/mL. A negative result does not preclude SARS-Cov-2 infection and should not be used as the sole basis for treatment or other patient management decisions. A negative  result may occur with  improper specimen collection/handling, submission of specimen other than nasopharyngeal swab, presence of viral mutation(s) within the areas targeted by this assay, and inadequate number of viral copies(<138 copies/mL). A negative result must be combined with clinical observations, patient history, and epidemiological information. The expected result is Negative.  Fact Sheet for Patients:  EntrepreneurPulse.com.au  Fact Sheet for Healthcare Providers:  IncredibleEmployment.be  This test is no t yet approved or cleared by the [place] FDA and  has been authorized for detection and/or diagnosis of SARS-CoV-2 by FDA under an Emergency Use Authorization (EUA). This EUA will remain  in effect (meaning this test can be used) for the duration of the COVID-19 declaration under Section 564(b)(1) of the Act, 21 U.S.C.section 360bbb-3(b)(1), unless the authorization is terminated  or revoked sooner.       Influenza A by PCR NEGATIVE NEGATIVE Final   Influenza B by PCR NEGATIVE NEGATIVE Final    Comment: (NOTE) The Xpert Xpress SARS-CoV-2/FLU/RSV plus assay is intended as an aid in the diagnosis of influenza from Nasopharyngeal swab specimens and should not be used as a sole basis for treatment. Nasal washings and aspirates are unacceptable for Xpert Xpress SARS-CoV-2/FLU/RSV testing.  Fact Sheet for Patients: EntrepreneurPulse.com.au  Fact Sheet for Healthcare Providers: IncredibleEmployment.be  This test is not yet approved or cleared by the [place] FDA and has been authorized for detection and/or diagnosis of SARS-CoV-2 by FDA under an Emergency Use Authorization (EUA). This EUA will remain in effect (meaning this test can be used) for the duration of the COVID-19 declaration under Section 564(b)(1) of the Act, 21 U.S.C. section 360bbb-3(b)(1), unless the authorization is  terminated or revoked.  Performed at [facility], [address] [place], [place] [address]          Impression:  Patient is 81 year old Caucasian male with a past medical history of diabetes mellitus, proteinuria, CKD stage IV, morbid obesity, hypertension, obstructive sleep apnea, nephrolithiasis, chronic diastolic CHF who is admitted with Acute on chronic diastolic CHF  1)Renal   AKI Patient has AKI secondary to ATN Patient has AKI secondary to diuretics  CKD stage IV. Patient has CKD stage IV secondary to diabetes mellitus. Patient has CKD stage V going back to 2014 Patient has had elevated microalbumin creatinine ratio of 504 going back to 2015 Creatinine trend 2023 2.9--3.2==>3.4=>3.6 Patient had a peak creatinine of 5.9 during August admission 2022 2.4--3.0 2021 2.4--2.8 2015 2.1  Patient did require renal placement therapy during his admission in August 2023.  Patient creatinine is currently at his baseline. Patient responding well to diuretics No need for renal placement therapy today   2)HTN    Blood pressure is stable  3)Anemia of chronic disease     Latest Ref Rng & Units 06/15/2022    4:45 AM 06/14/2022    4:47 AM 06/13/2022    2:40 AM  CBC  WBC 4.0 - 10.5 K/uL 9.5  10.9  10.8   Hemoglobin 13.0 - 17.0 g/dL 9.9  10.2  10.7   Hematocrit 39.0 - 52.0 % 30.6  32.1  33.4   Platelets 150 - 400 K/uL 271  269  265        HGb at goal (9--11)   4) Secondary hyperparathyroidism -CKD Mineral-Bone Disorder    Lab Results  Component Value Date   CALCIUM 8.3 (L) 06/15/2022   CAION 1.19 09/11/2020   PHOS 4.3 (H) 04/10/2022    Secondary Hyperparathyroidism present . Patient intact PTH as an outpatient was 148 Patient is on calcitriol Phosphorus at goal.   5) nephrotic range proteinuria  Patient has had nephrotic range proteinuria going back to December 2022 Patient currently not on any RAAS blockade secondary to his low  GFR   6) Electrolytes      Latest Ref Rng & Units 06/15/2022    4:45 AM 06/14/2022    4:47 AM 06/13/2022    2:40 AM  BMP  Glucose 70 - 99 mg/dL 208  193  178   BUN 8 - 23 mg/dL 70  67  57   Creatinine 0.61 - 1.24 mg/dL 3.55  3.41  3.02   Sodium 135 - 145 mmol/L 139  142  140   Potassium 3.5 - 5.1 mmol/L 3.4  3.8  3.9   Chloride 98 - 111 mmol/L 102  105  107   CO2 22 - 32 mmol/L '26  28  27   '$ Calcium 8.9 - 10.3 mg/dL 8.3  8.3  8.5      Sodium Normonatremic   Potassium Hypokalemia Patient potassium is low secondary to diuretics Will give patient KCl    7)Acid base   Co2 at goal  8) NephroLithiasis Patient has history of nephrolithiasis Patient continues to be on potassium citrate  9)Acute on chronic diastolic CHF/acute Pulmonary edema Patient was admitted with fluid overload Patient is on Lasix 80 mg IV twice daily Patient is currently 7.0L negative Will discontinue patient on current dose of diuretics-Lasix 80 mg IV twice daily, will start PO lasix '80mg'$  po bid  Will follow    Plan:  Patient is hypokalemic secondary to diuretics.  Will replete patient potassium Patient is now 7 L negative.  Will change patient's diuretics from IV to p.o.     Jemimah Cressy s Theador Hawthorne 06/15/2022, 4:01 PM

## 2022-06-15 NOTE — Progress Notes (Signed)
Mobility Specialist - Progress Note   06/15/22 1029  Mobility  Activity Ambulated with assistance in hallway;Stood at bedside;Dangled on edge of bed  Level of Assistance Standby assist, set-up cues, supervision of patient - no hands on  Assistive Device Front wheel walker  Distance Ambulated (ft) 240 ft  Activity Response Tolerated well  Mobility Referral Yes  $Mobility charge 1 Mobility   Pt supine in bed on RA upon arrival. Pt STS and ambulates in hallway SBA with no LOB. Pt returns to bed with needs in reach and visitors in room.   Gretchen Short  Mobility Specialist  06/15/22 10:30 AM

## 2022-06-16 DIAGNOSIS — E1122 Type 2 diabetes mellitus with diabetic chronic kidney disease: Secondary | ICD-10-CM | POA: Diagnosis not present

## 2022-06-16 DIAGNOSIS — I5033 Acute on chronic diastolic (congestive) heart failure: Secondary | ICD-10-CM | POA: Diagnosis not present

## 2022-06-16 DIAGNOSIS — N179 Acute kidney failure, unspecified: Secondary | ICD-10-CM | POA: Diagnosis not present

## 2022-06-16 DIAGNOSIS — G4733 Obstructive sleep apnea (adult) (pediatric): Secondary | ICD-10-CM

## 2022-06-16 DIAGNOSIS — I10 Essential (primary) hypertension: Secondary | ICD-10-CM

## 2022-06-16 LAB — CBC
HCT: 30.7 % — ABNORMAL LOW (ref 39.0–52.0)
Hemoglobin: 10 g/dL — ABNORMAL LOW (ref 13.0–17.0)
MCH: 28.8 pg (ref 26.0–34.0)
MCHC: 32.6 g/dL (ref 30.0–36.0)
MCV: 88.5 fL (ref 80.0–100.0)
Platelets: 274 10*3/uL (ref 150–400)
RBC: 3.47 MIL/uL — ABNORMAL LOW (ref 4.22–5.81)
RDW: 13.6 % (ref 11.5–15.5)
WBC: 9.7 10*3/uL (ref 4.0–10.5)
nRBC: 0 % (ref 0.0–0.2)

## 2022-06-16 LAB — GLUCOSE, CAPILLARY
Glucose-Capillary: 154 mg/dL — ABNORMAL HIGH (ref 70–99)
Glucose-Capillary: 238 mg/dL — ABNORMAL HIGH (ref 70–99)
Glucose-Capillary: 218 mg/dL — ABNORMAL HIGH (ref 70–99)
Glucose-Capillary: 210 mg/dL — ABNORMAL HIGH (ref 70–99)
Glucose-Capillary: 243 mg/dL — ABNORMAL HIGH (ref 70–99)

## 2022-06-16 LAB — BASIC METABOLIC PANEL
Anion gap: 9 (ref 5–15)
BUN: 73 mg/dL — ABNORMAL HIGH (ref 8–23)
CO2: 27 mmol/L (ref 22–32)
Calcium: 8.3 mg/dL — ABNORMAL LOW (ref 8.9–10.3)
Chloride: 105 mmol/L (ref 98–111)
Creatinine, Ser: 3.58 mg/dL — ABNORMAL HIGH (ref 0.61–1.24)
GFR, Estimated: 16 mL/min — ABNORMAL LOW (ref >60)
Glucose, Bld: 197 mg/dL — ABNORMAL HIGH (ref 70–99)
Potassium: 3.4 mmol/L — ABNORMAL LOW (ref 3.5–5.1)
Sodium: 141 mmol/L (ref 135–145)

## 2022-06-16 MED ORDER — FUROSEMIDE 10 MG/ML IJ SOLN
40.0000 mg | Freq: Once | INTRAMUSCULAR | Status: AC
  Administered 2022-06-16: 40 mg via INTRAVENOUS
  Filled 2022-06-16: qty 4

## 2022-06-16 MED ORDER — IPRATROPIUM-ALBUTEROL 0.5-2.5 (3) MG/3ML IN SOLN: 3.0000 mL | Freq: Four times a day (QID) | RESPIRATORY_TRACT | Status: DC | PRN

## 2022-06-16 MED ORDER — INSULIN DETEMIR 100 UNIT/ML ~~LOC~~ SOLN
40.0000 [IU] | Freq: Every day | SUBCUTANEOUS | Status: DC
  Administered 2022-06-16 – 2022-06-19 (×4): 40 [IU] via SUBCUTANEOUS
  Filled 2022-06-16 (×5): qty 0.4

## 2022-06-16 MED ORDER — ALPRAZOLAM 0.25 MG PO TABS
0.2500 mg | ORAL_TABLET | Freq: Three times a day (TID) | ORAL | Status: DC | PRN
  Administered 2022-06-16: 0.25 mg via ORAL
  Filled 2022-06-16: qty 1

## 2022-06-16 NOTE — Progress Notes (Signed)
James Clarke  MRN: [id number]  DOB/AGE: [birth year] 82 y.o.  Primary Care Physician:Feldpausch, Chrissie Noa, MD  Admit date: 06/12/2022  Chief Complaint:  Chief Complaint  Patient presents with   Shortness of Breath    S-Pt presented on  06/12/2022 with  Chief Complaint  Patient presents with   Shortness of Breath  .  Patient is 81 year old Caucasian male with a past medical history diabetes mellitus type 2, CKD stage IV, morbid obesity, hypertension, obstructive sleep apnea, nephrolithiasis, chronic diastolic CHF who came to the ER with chief complaint of shortness of breath. Upon evaluation in the ER patient was found to be in fluid overload and was admitted. Patient is well-known to nephrology practice. Patient follows up with Dr. Holley Raring as an outpatient.  Patient last visit was on April 23, 2022 with a plan to follow-up in a month.  Patient was seen today on second floor. Patient family was present in the room as always. Patient major concern in today visit was that he was feeling a little worse than yesterday.    Medications  amLODipine  10 mg Oral Daily   aspirin EC  81 mg Oral Daily   calcitRIOL  0.25 mcg Oral Daily   furosemide  80 mg Oral BID   heparin  5,000 Units Subcutaneous Q8H   hydrALAZINE  50 mg Oral TID   insulin aspart  0-20 Units Subcutaneous TID WC   insulin detemir  50 Units Subcutaneous QHS   isosorbide mononitrate  30 mg Oral Daily   metoprolol succinate  50 mg Oral Daily   potassium chloride  20 mEq Oral Daily   pravastatin  40 mg Oral q1800   sodium chloride flush  3 mL Intravenous Q12H   tamsulosin  0.4 mg Oral Daily         GEX:BMWUX from the symptoms mentioned above,there are no other symptoms referable to all systems reviewed.  Physical Exam: Vital signs in last 24 hours: Temp:  [97.8 F (36.6 C)-98.4 F (36.9 C)] 98 F (36.7 C) (11/26 0348) Pulse Rate:  [69-85] 69 (11/26 0348) Resp:  [16-20] 20 (11/26 0348) BP:  (124-144)/(51-64) 137/62 (11/26 0348) SpO2:  [95 %-99 %] 97 % (11/26 0348) Weight:  [140.6 kg] 140.6 kg (11/26 0500) Weight change: -7.085 kg Last BM Date : 06/13/22  Intake/Output from previous day: 11/25 0701 - 11/26 0700 In: 240 [P.O.:240] Out: 1600 [Urine:1600] No intake/output data recorded.   Physical Exam:  General- pt is awake,alert, oriented to time place and person  HEENT-head is atraumatic normocephalic sclera is anicteric  Resp- No acute REsp distress, Decreased breath sounds at bases, Minimal crackles  CVS- S1S2 regular in rate and rhythm  GIT- BS+, soft, Non tender , Non distended  EXT- 1+ LE Edema,  No Cyanosis    Lab Results:  CBC  Recent Labs    06/15/22 0445 06/16/22 0515  WBC 9.5 9.7  HGB 9.9* 10.0*  HCT 30.6* 30.7*  PLT 271 274    BMET  Recent Labs    06/15/22 0445 06/16/22 0515  NA 139 141  K 3.4* 3.4*  CL 102 105  CO2 26 27  GLUCOSE 208* 197*  BUN 70* 73*  CREATININE 3.55* 3.58*  CALCIUM 8.3* 8.3*      Most recent Creatinine trend  Lab Results  Component Value Date   CREATININE 3.58 (H) 06/16/2022   CREATININE 3.55 (H) 06/15/2022   CREATININE 3.41 (H) 06/14/2022      MICRO   Recent  Results (from the past 240 hour(s))  Resp Panel by RT-PCR (Flu A&B, Covid) Anterior Nasal Swab     Status: None   Collection Time: 06/12/22  2:20 PM   Specimen: Anterior Nasal Swab  Result Value Ref Range Status   SARS Coronavirus 2 by RT PCR NEGATIVE NEGATIVE Final    Comment: (NOTE) SARS-CoV-2 target nucleic acids are NOT DETECTED.  The SARS-CoV-2 RNA is generally detectable in upper respiratory specimens during the acute phase of infection. The lowest concentration of SARS-CoV-2 viral copies this assay can detect is 138 copies/mL. A negative result does not preclude SARS-Cov-2 infection and should not be used as the sole basis for treatment or other patient management decisions. A negative result may occur with  improper  specimen collection/handling, submission of specimen other than nasopharyngeal swab, presence of viral mutation(s) within the areas targeted by this assay, and inadequate number of viral copies(<138 copies/mL). A negative result must be combined with clinical observations, patient history, and epidemiological information. The expected result is Negative.  Fact Sheet for Patients:  EntrepreneurPulse.com.au  Fact Sheet for Healthcare Providers:  IncredibleEmployment.be  This test is no t yet approved or cleared by the [place] FDA and  has been authorized for detection and/or diagnosis of SARS-CoV-2 by FDA under an Emergency Use Authorization (EUA). This EUA will remain  in effect (meaning this test can be used) for the duration of the COVID-19 declaration under Section 564(b)(1) of the Act, 21 U.S.C.section 360bbb-3(b)(1), unless the authorization is terminated  or revoked sooner.       Influenza A by PCR NEGATIVE NEGATIVE Final   Influenza B by PCR NEGATIVE NEGATIVE Final    Comment: (NOTE) The Xpert Xpress SARS-CoV-2/FLU/RSV plus assay is intended as an aid in the diagnosis of influenza from Nasopharyngeal swab specimens and should not be used as a sole basis for treatment. Nasal washings and aspirates are unacceptable for Xpert Xpress SARS-CoV-2/FLU/RSV testing.  Fact Sheet for Patients: EntrepreneurPulse.com.au  Fact Sheet for Healthcare Providers: IncredibleEmployment.be  This test is not yet approved or cleared by the [place] FDA and has been authorized for detection and/or diagnosis of SARS-CoV-2 by FDA under an Emergency Use Authorization (EUA). This EUA will remain in effect (meaning this test can be used) for the duration of the COVID-19 declaration under Section 564(b)(1) of the Act, 21 U.S.C. section 360bbb-3(b)(1), unless the authorization is terminated or revoked.  Performed at  [facility], [address], [place], [place] [address]          Impression:  Patient is 81 year old Caucasian male with a past medical history of diabetes mellitus, proteinuria, CKD stage IV, morbid obesity, hypertension, obstructive sleep apnea, nephrolithiasis, chronic diastolic CHF who is admitted with Acute on chronic diastolic CHF  1)Renal   AKI Patient has AKI secondary to ATN Patient has AKI secondary to diuretics  CKD stage IV. Patient has CKD stage IV secondary to diabetes mellitus. Patient has CKD stage V going back to 2014 Patient has had elevated microalbumin creatinine ratio of 504 going back to 2015 Creatinine trend 2023 2.9--3.2==>3.4=>3.6 Patient had a peak creatinine of 5.9 during August admission 2022 2.4--3.0 2021 2.4--2.8 2015 2.1  Patient did require renal placement therapy during his admission in August 2023.  I have had multiple discussion with the patient and his family about possible need for renal placement therapy  2)HTN    Blood pressure is stable    3)Anemia of chronic disease     Latest Ref  Rng & Units 06/16/2022    5:15 AM 06/15/2022    4:45 AM 06/14/2022    4:47 AM  CBC  WBC 4.0 - 10.5 K/uL 9.7  9.5  10.9   Hemoglobin 13.0 - 17.0 g/dL 10.0  9.9  10.2   Hematocrit 39.0 - 52.0 % 30.7  30.6  32.1   Platelets 150 - 400 K/uL 274  271  269        HGb at goal (9--11)   4) Secondary hyperparathyroidism -CKD Mineral-Bone Disorder    Lab Results  Component Value Date   CALCIUM 8.3 (L) 06/16/2022   CAION 1.19 09/11/2020   PHOS 4.3 (H) 04/10/2022    Secondary Hyperparathyroidism present . Patient intact PTH as an outpatient was 148 Patient is on calcitriol Phosphorus at goal.   5) nephrotic range proteinuria  Patient has had nephrotic range proteinuria going back to December 2022 Patient currently not on any RAAS blockade secondary to his low GFR   6) Electrolytes      Latest Ref Rng & Units 06/16/2022     5:15 AM 06/15/2022    4:45 AM 06/14/2022    4:47 AM  BMP  Glucose 70 - 99 mg/dL 197  208  193   BUN 8 - 23 mg/dL 73  70  67   Creatinine 0.61 - 1.24 mg/dL 3.58  3.55  3.41   Sodium 135 - 145 mmol/L 141  139  142   Potassium 3.5 - 5.1 mmol/L 3.4  3.4  3.8   Chloride 98 - 111 mmol/L 105  102  105   CO2 22 - 32 mmol/L '27  26  28   '$ Calcium 8.9 - 10.3 mg/dL 8.3  8.3  8.3      Sodium Normonatremic   Potassium Hypokalemia Patient potassium is low secondary to diuretics Will give patient KCl    7)Acid base   Co2 at goal  8) NephroLithiasis Patient has history of nephrolithiasis Patient continues to be on potassium citrate  9)Acute on chronic diastolic CHF/acute Pulmonary edema Patient was admitted with fluid overload. I have changed patient's Lasix from IV to p.o. yesterday Patient symptoms have worsened since yesterday Will continue patient on Lasix p.o. twice daily Will give an extra dose of 40 mg IV Lasix in the interim Will follow patient's urine output Patient is currently 8.5L negative I did discuss the patient again about possible need for renal placement therapy Will follow    Plan:  Patient is hypokalemic secondary to diuretics.  Will replete patient potassium Will increase the dose of diuretics-given extra dose of Lasix IV . Depending upon the patient's response/clinical features/creatinine we will decide about changing patient's dose-if patient needs Lasix drip versus transition to p.o dose.     Sharunda Salmon s Theador Hawthorne 06/16/2022, 9:35 AM

## 2022-06-16 NOTE — Progress Notes (Signed)
Progress Note   Patient: James Clarke ASN:[id number] DOB: [birth year] DOA: 06/12/2022     4 DOS: the patient was seen and examined on 06/16/2022   Brief hospital course: James Clarke is a 81 y.o. male with medical history significant for diabetes mellitus with complications of stage IV chronic kidney disease, morbid obesity (BMI 46.49 kg/m2), hypertension, obstructive sleep apnea, history of nephrolithiasis, chronic diastolic dysfunction CHF with last known LVEF of 55 to 60% from 08/23 who presents to the ER via EMS for evaluation of worsening shortness of breath.  Patient states that he has had shortness of breath for several days but worse 1 day prior to his admission.  He states that he slept in his recliner with his CPAP because he could not breathe laying flat.  He also complains of midsternal chest pain which he describes as feeling like someone is sitting on his chest.  He rates it a 7 x 10 in intensity at its worst.  It is nonradiating and he denies having any nausea, no vomiting, no palpitations or diaphoresis.  He has mild lower extremity swelling.  He has been taking his Lasix as prescribed every other day but admits to drinking too much fluids.  His wife notes that his urine output has reduced despite taking the diuretics. He denies having any cough, no fever, no chills, no headache, no abdominal pain, no changes in his bowel habits, no dizziness, no lightheadedness or blurred vision. Labs show elevated BNP level Chest x-ray reviewed by me shows enlargement of cardiac silhouette with bibasilar infiltrates question pulmonary edema. Small associated LEFT pleural effusion Twelve-lead EKG reviewed by me shows normal sinus rhythm with sinus arrhythmia.  Nonspecific ST and T wave changes Patient received Lasix 60 mg IV in the ER and will be admitted to the hospital for further evaluation  Patient was diuresed with IV Lasix past few days.  Switch to oral Lasix on 06/16/2022.  Will end up  giving another IV dose of Lasix on 06/16/2022.  Assessment and Plan: * Acute on chronic diastolic CHF (congestive heart failure) (HCC) EF 55 to 60%.  Changed to oral Lasix for this morning but needs a dose of IV Lasix midday.  Continue Toprol  Acute renal failure superimposed on chronic kidney disease ([place]) Creatinine as low as 2.90 on 06/12/2022.  Creatinine up to 3.58 today.  Type 2 diabetes mellitus with renal manifestations (HCC) Chronic kidney disease stage IV.  Decreased dose of long-acting insulin with elevation of creatinine to 3.58.  Hypertension Continue amlodipine, metoprolol and hydralazine.  Sleep apnea Secondary to morbid obesity Continue CPAP at bedtime  Morbid obesity due to excess calories (HCC) Last BMI 44.48        Subjective: Patient always feels short of breath.  Patient switched to oral Lasix today and states he is not urinating as much as previous.  Still feels swollen.  Admitted with CHF exacerbation.  Physical Exam: Vitals:   06/16/22 0348 06/16/22 0500 06/16/22 0800 06/16/22 1540  BP: 137/62  (!) 140/67 (!) 141/68  Pulse: 69  67 74  Resp: 20  17   Temp: 98 F (36.7 C)  98.1 F (36.7 C) 98.4 F (36.9 C)  TempSrc:      SpO2: 97%  98% 97%  Weight:  (!) 140.6 kg    Height:       Physical Exam HENT:     Head: Normocephalic.     Mouth/Throat:     Pharynx: No oropharyngeal exudate.  Eyes:     General: Lids are normal.     Conjunctiva/sclera: Conjunctivae normal.  Cardiovascular:     Rate and Rhythm: Normal rate and regular rhythm.     Heart sounds: Normal heart sounds, S1 normal and S2 normal.  Pulmonary:     Breath sounds: Examination of the right-lower field reveals decreased breath sounds. Examination of the left-lower field reveals decreased breath sounds. Decreased breath sounds present. No wheezing, rhonchi or rales.     Comments: Transmitted upper airway wheeze heard after sitting up. Abdominal:     Palpations: Abdomen is soft.      Tenderness: There is no abdominal tenderness.  Musculoskeletal:     Right lower leg: Swelling present.     Left lower leg: Swelling present.  Skin:    General: Skin is warm.     Findings: No rash.  Neurological:     Mental Status: He is alert and oriented to person, place, and time.     Data Reviewed: Potassium 3.4, creatinine 3.58 with a BUN of 73, hemoglobin 10.0  Family Communication: Spoke with wife at the bedside  Disposition: Status is: Inpatient Remains inpatient appropriate because: Giving an extra dose of IV Lasix today.  Planned Discharge Destination: Home    Time spent: 29 minutes Case discussed with nephrology  Author: Loletha Grayer, MD 06/16/2022 3:45 PM  For on call review [email or web address].

## 2022-06-16 NOTE — Progress Notes (Signed)
[facility] Encounter Note  Patient: James Clarke / Admit Date: 06/12/2022 / Date of Encounter: 06/16/2022, 5:24 PM   Subjective: 11/23.  Patient had episode of chest discomfort last night not particularly relieved by nitroglycerin.  Patient's oxygenation may have helped with improvements of symptoms.  Troponin levels have been 61/78/82 more consistent with volume overload and pulmonary edema rather than acute coronary syndrome.  EKG was unchanged from admission with nonspecific ST changes.  The patient has had 2 L of urine output which has improved his current condition although primary issue appears to be chronic kidney disease with stage IV.  11/24.  Patient has had significant amount of urine output with significant improvements of symptoms.  He has much less lower extremity edema and improved pulmonary edema with Rales.  The patient is able to ambulate more and with no evidence of chest discomfort or severe shortness of breath.  11/25.  Patient continues to have reasonable urine output now over 6-1/2 L with intravenous Lasix which is improving his overall status.  Glomerular filtration rate still reasonable for him.  No evidence of cardiovascular symptoms at this time  11/26.  Patient has had more episodes chest pressure and discomfort as well as more potential pulmonary edema and weight gain.  This is most likely secondary to kidney dysfunction which has not been responding to oral medication management.  There is an outside chance that the patient may have progression coronary artery disease of left anterior descending artery but will need further treatment from nephrology prior to further intervention from the cardiovascular standpoint.  Echocardiogram earlier this year showed normal LV systolic function and no evidence of significant valvular heart disease  Cardiac catheterization showed moderate atherosclerosis of 3 vessels without evidence of need for  intervention  Review of Systems: Positive for: Shortness of breath and no chest pain Negative for: Vision change, hearing change, syncope, dizziness, nausea, vomiting,diarrhea, bloody stool, stomach pain, cough, congestion, diaphoresis, urinary frequency, urinary pain,skin lesions, skin rashes Others previously listed  Objective: Telemetry: Normal sinus rhythm Physical Exam: Blood pressure (!) 141/68, pulse 74, temperature 98.4 F (36.9 C), resp. rate 17, height '5\' 10"'$  (1.778 m), weight (!) 140.6 kg, SpO2 97 %. Body mass index is 44.48 kg/m. General: Well developed, well nourished, in no acute distress. Head: Normocephalic, atraumatic, sclera non-icteric, no xanthomas, nares are without discharge. Neck: No apparent masses Lungs: Normal respirations with no wheezes, no rhonchi, basilar rales , no crackles   Heart: Regular rate and rhythm, normal S1 S2, no murmur, no rub, no gallop, PMI is normal size and placement, carotid upstroke normal without bruit, jugular venous pressure normal Abdomen: Soft, non-tender, non-distended with normoactive bowel sounds. No hepatosplenomegaly. Abdominal aorta is normal size without bruit Extremities: 1+'s edema, no clubbing, no cyanosis, no ulcers,  Peripheral: 2+ radial, 2+ femoral, 2+ dorsal pedal pulses Neuro: Alert and oriented. Moves all extremities spontaneously. Psych:  Responds to questions appropriately with a normal affect.   Intake/Output Summary (Last 24 hours) at 06/16/2022 1724 Last data filed at 06/16/2022 0600 Gross per 24 hour  Intake --  Output 800 ml  Net -800 ml     Inpatient Medications:   amLODipine  10 mg Oral Daily   aspirin EC  81 mg Oral Daily   calcitRIOL  0.25 mcg Oral Daily   furosemide  80 mg Oral BID   heparin  5,000 Units Subcutaneous Q8H   hydrALAZINE  50 mg Oral TID   insulin aspart  0-20  Units Subcutaneous TID WC   insulin detemir  40 Units Subcutaneous QHS   isosorbide mononitrate  30 mg Oral Daily    metoprolol succinate  50 mg Oral Daily   potassium chloride  20 mEq Oral Daily   pravastatin  40 mg Oral q1800   sodium chloride flush  3 mL Intravenous Q12H   tamsulosin  0.4 mg Oral Daily   Infusions:   sodium chloride      Labs: Recent Labs    06/15/22 0445 06/16/22 0515  NA 139 141  K 3.4* 3.4*  CL 102 105  CO2 26 27  GLUCOSE 208* 197*  BUN 70* 73*  CREATININE 3.55* 3.58*  CALCIUM 8.3* 8.3*    No results for input(s): "AST", "ALT", "ALKPHOS", "BILITOT", "PROT", "ALBUMIN" in the last 72 hours. Recent Labs    06/15/22 0445 06/16/22 0515  WBC 9.5 9.7  HGB 9.9* 10.0*  HCT 30.6* 30.7*  MCV 89.0 88.5  PLT 271 274    No results for input(s): "CKTOTAL", "CKMB", "TROPONINI" in the last 72 hours. Invalid input(s): "POCBNP" No results for input(s): "HGBA1C" in the last 72 hours.   Weights: Filed Weights   06/14/22 0400 06/15/22 0600 06/16/22 0500  Weight: (!) 144.3 kg (!) 147.7 kg (!) 140.6 kg     Radiology/Studies:  DG Chest 1 View  Result Date: 06/14/2022 CLINICAL DATA:  Shortness of breath EXAM: CHEST  1 VIEW COMPARISON:  06/12/2022 and prior studies FINDINGS: The cardiomediastinal silhouette is unchanged. Mild interstitial opacities are again noted as well as mild bibasilar opacities/atelectasis. There is no evidence of pneumothorax or acute bony abnormality. There has been little interval change since prior study. IMPRESSION: Unchanged appearance of the chest with mild interstitial opacities and mild bibasilar opacities/atelectasis. Electronically Signed   By: Margarette Canada M.D.   On: 06/14/2022 08:46   CT CHEST WO CONTRAST  Result Date: 06/12/2022 CLINICAL DATA:  Chest pain and shortness of breath. EXAM: CT CHEST WITHOUT CONTRAST TECHNIQUE: Multidetector CT imaging of the chest was performed following the standard protocol without IV contrast. RADIATION DOSE REDUCTION: This exam was performed according to the departmental dose-optimization program which includes  automated exposure control, adjustment of the mA and/or kV according to patient size and/or use of iterative reconstruction technique. COMPARISON:  CT scan from 2015. FINDINGS: Cardiovascular: The heart is normal in size for age. Small pericardial effusion noted. The aorta is normal in caliber. Scattered atherosclerotic calcifications. Extensive three-vessel coronary artery calcifications. Mediastinum/Nodes: Scattered borderline mediastinal and hilar lymph nodes, likely reactive. The esophagus is grossly normal. Lungs/Pleura: Small bilateral pleural effusions with overlying atelectasis. Changes of interstitial lung disease with peripheral subpleural reticulations, thickening of the interlobular septi, faint nodularity and extensive lower lobe predominant parenchymal calcifications. This could be due to chronic inflammation, previous infection, aspiration and less likely pulmonary alveolar microlithiasis. Patchy nodular ground-glass opacities in the right lower lobe may suggest a more acute inflammatory process and aspiration or bronchopneumonia are possibilities. Upper Abdomen: No significant upper abdominal findings. No hepatic or adrenal gland lesions. No upper abdominal adenopathy. Scattered vascular calcifications. Musculoskeletal: No chest wall mass, supraclavicular or axillary adenopathy. The bony thorax is intact. IMPRESSION: 1. Changes of interstitial lung disease as detailed above. 2. Stable lower lobe predominant parenchymal lung calcifications as above. 3. Patchy nodular ground-glass opacities in the right lower lobe may suggest a more acute inflammatory process and aspiration or bronchopneumonia are possibilities. 4. Small bilateral pleural effusions with overlying atelectasis. 5. Small pericardial effusion. 6. Extensive  three-vessel coronary artery calcifications. Electronically Signed   By: Marijo Sanes M.D.   On: 06/12/2022 16:42   DG Chest 2 View  Result Date: 06/12/2022 CLINICAL DATA:  Chest  pain and shortness of breath since last night, slept in recliner with CPAP all night EXAM: CHEST - 2 VIEW COMPARISON:  09/19/2009 FINDINGS: Enlargement of cardiac silhouette. Mediastinal contours and pulmonary vascularity normal. Interstitial infiltrates at the mid to lower lungs question edema. Small LEFT pleural effusion. No upper lobe infiltrate or pneumothorax. Osseous structures demineralized. IMPRESSION: Enlargement of cardiac silhouette with bibasilar infiltrates question pulmonary edema. Small associated LEFT pleural effusion. Electronically Signed   By: Lavonia Dana M.D.   On: 06/12/2022 13:21   CT RENAL STONE STUDY  Result Date: 05/23/2022 CLINICAL DATA:  Flank pain. EXAM: CT ABDOMEN AND PELVIS WITHOUT CONTRAST TECHNIQUE: Multidetector CT imaging of the abdomen and pelvis was performed following the standard protocol without IV contrast. RADIATION DOSE REDUCTION: This exam was performed according to the departmental dose-optimization program which includes automated exposure control, adjustment of the mA and/or kV according to patient size and/or use of iterative reconstruction technique. COMPARISON:  CT abdomen and pelvis 02/26/2022 FINDINGS: Lower chest: There is some chronic interstitial opacities in both lung bases similar to prior. There is a small pericardial effusion, similar to prior. There is also trace left pleural effusion. Hepatobiliary: No focal liver abnormality is seen. Status post cholecystectomy. No biliary dilatation. Pancreas: Unremarkable. No pancreatic ductal dilatation or surrounding inflammatory changes. Spleen: Normal in size without focal abnormality. Adrenals/Urinary Tract: There are 3 punctate calculi in the superior pole the right kidney measuring 3 mm each. There is a cyst in the superior pole the right kidney measuring 4 cm. There is no hydronephrosis or perinephric fluid collection. No ureteral or bladder calculi are seen. The bladder and adrenal glands are within normal  limits. Stomach/Bowel: Stomach is within normal limits. No evidence of bowel wall thickening, distention, or inflammatory changes. There is diffuse colonic diverticulosis. The appendix is not visualized. Vascular/Lymphatic: Aortic atherosclerosis. No enlarged abdominal or pelvic lymph nodes. Reproductive: Prostate is unremarkable. Other: There is a small fat containing umbilical hernia. No ascites. There is some subcutaneous density just beneath the skin surface in the upper left anterior abdominal wall which is unchanged, possibly scarring. Musculoskeletal: Degenerative changes affect the spine. IMPRESSION: 1. Nonobstructing right renal calculi. No hydronephrosis or ureteral calculus. 2. Colonic diverticulosis without evidence for diverticulitis. 3. Stable small pericardial effusion. 4. Trace left pleural effusion. 5. Right Bosniak II benign renal cyst measuring 4 cm. No follow-up imaging is recommended. JACR 2018 Feb; 264-273, Management of the Incidental Renal Mass on CT, [employer] 2021; 814-848, Bosniak Classification of Cystic Renal Masses, Version 2019. Aortic Atherosclerosis (ICD10-I70.0). Electronically Signed   By: Ronney Asters M.D.   On: 05/23/2022 15:40     Assessment and Recommendation  81 y.o. male with known sleep apnea moderate atherosclerosis of coronary arteries and chronic kidney disease stage IV with progression volume overload causing pulmonary edema hypoxia shortness of breath and chest pain without evidence of acute coronary syndrome 1.  Continue intravenous Lasix today for further urine output and improvements of pulmonary edema and lower extremity edema likely for 1 more day watching closely for worsening glomerular filtration rate but as per nephrology 2.  Continue treatment options as per nephrology for further evaluation and treatment options kidney dysfunction for   short and long-term treatment of volume overload 3.  No further treatment for chest pain likely secondary to  pulmonary edema and hypoxia rather than acute coronary syndrome..  If patient has chest discomfort despite medical management as well as dialysis and/or treatment with nephrology and chronic kidney disease then would consider the possibility of cardiac catheterization thereafter 4.  No further cardiac diagnostic center intervention at this time until further evaluation from nephrology 5.  Continuation of antihypertensive medication management and high intensity cholesterol therapy as before  Signed, Serafina Royals M.D. FACC

## 2022-06-16 NOTE — Assessment & Plan Note (Addendum)
Creatinine as low as 2.90 on 06/12/2022.  Creatinine 3.55 today.

## 2022-06-16 NOTE — Hospital Course (Addendum)
James Clarke is a 81 y.o. male with medical history significant for diabetes mellitus with complications of stage IV chronic kidney disease, morbid obesity (BMI 46.49 kg/m2), hypertension, obstructive sleep apnea, history of nephrolithiasis, chronic diastolic dysfunction CHF with last known LVEF of 55 to 60% from 08/23 who presents to the ER via EMS for evaluation of worsening shortness of breath.  Patient states that he has had shortness of breath for several days but worse 1 day prior to his admission.  He states that he slept in his recliner with his CPAP because he could not breathe laying flat.  He also complains of midsternal chest pain which he describes as feeling like someone is sitting on his chest.  He rates it a 7 x 10 in intensity at its worst.  It is nonradiating and he denies having any nausea, no vomiting, no palpitations or diaphoresis.  He has mild lower extremity swelling.  He has been taking his Lasix as prescribed every other day but admits to drinking too much fluids.  His wife notes that his urine output has reduced despite taking the diuretics. He denies having any cough, no fever, no chills, no headache, no abdominal pain, no changes in his bowel habits, no dizziness, no lightheadedness or blurred vision. Labs show elevated BNP level Chest x-ray reviewed by me shows enlargement of cardiac silhouette with bibasilar infiltrates question pulmonary edema. Small associated LEFT pleural effusion Twelve-lead EKG reviewed by me shows normal sinus rhythm with sinus arrhythmia.  Nonspecific ST and T wave changes Patient received Lasix 60 mg IV in the ER and will be admitted to the hospital for further evaluation  Patient was diuresed with IV Lasix during hospital course.  Switched to oral Lasix on 06/16/2022.  Will end up giving another IV dose of Lasix on 06/16/2022.  Patient switched back to IV Lasix 80 mg IV twice daily on 06/17/2022.

## 2022-06-16 NOTE — Progress Notes (Signed)
Mobility Specialist - Progress Note   Pre-mobility: SpO2 (95) During mobility: SpO2 (92, 94) Post-mobility: SPO2 (95)     06/16/22 1151  Mobility  Activity Ambulated with assistance in hallway;Stood at bedside  Level of Assistance Standby assist, set-up cues, supervision of patient - no hands on  Assistive Device Front wheel walker  Distance Ambulated (ft) 160 ft  Activity Response Tolerated well  Mobility Referral Yes  $Mobility charge 1 Mobility   Pt resting supine in bed upon entry on RA. PT STS MinA and ambulates SBA in hallway around NS. Pt took 1x standing rest break with RW; author offered a chair but, pt declined. Pt expressed SOB at the end of lap but, said he felt better after returning to bed. Pt left with needs in reach and family present.   Loma Sender Mobility Specialist 06/16/22, 11:56 AM

## 2022-06-17 DIAGNOSIS — E1122 Type 2 diabetes mellitus with diabetic chronic kidney disease: Secondary | ICD-10-CM | POA: Diagnosis not present

## 2022-06-17 DIAGNOSIS — I10 Essential (primary) hypertension: Secondary | ICD-10-CM | POA: Diagnosis not present

## 2022-06-17 DIAGNOSIS — D631 Anemia in chronic kidney disease: Secondary | ICD-10-CM

## 2022-06-17 DIAGNOSIS — I5033 Acute on chronic diastolic (congestive) heart failure: Secondary | ICD-10-CM | POA: Diagnosis not present

## 2022-06-17 DIAGNOSIS — N179 Acute kidney failure, unspecified: Secondary | ICD-10-CM | POA: Diagnosis not present

## 2022-06-17 LAB — BASIC METABOLIC PANEL
Anion gap: 11 (ref 5–15)
BUN: 74 mg/dL — ABNORMAL HIGH (ref 8–23)
CO2: 26 mmol/L (ref 22–32)
Calcium: 8.4 mg/dL — ABNORMAL LOW (ref 8.9–10.3)
Chloride: 104 mmol/L (ref 98–111)
Creatinine, Ser: 3.55 mg/dL — ABNORMAL HIGH (ref 0.61–1.24)
GFR, Estimated: 17 mL/min — ABNORMAL LOW (ref >60)
Glucose, Bld: 207 mg/dL — ABNORMAL HIGH (ref 70–99)
Potassium: 3.4 mmol/L — ABNORMAL LOW (ref 3.5–5.1)
Sodium: 141 mmol/L (ref 135–145)

## 2022-06-17 LAB — GLUCOSE, CAPILLARY
Glucose-Capillary: 208 mg/dL — ABNORMAL HIGH (ref 70–99)
Glucose-Capillary: 256 mg/dL — ABNORMAL HIGH (ref 70–99)
Glucose-Capillary: 267 mg/dL — ABNORMAL HIGH (ref 70–99)
Glucose-Capillary: 143 mg/dL — ABNORMAL HIGH (ref 70–99)

## 2022-06-17 MED ORDER — INSULIN ASPART 100 UNIT/ML IJ SOLN
5.0000 [IU] | Freq: Three times a day (TID) | INTRAMUSCULAR | Status: DC
  Administered 2022-06-17 – 2022-06-20 (×8): 5 [IU] via SUBCUTANEOUS
  Filled 2022-06-17 (×8): qty 1

## 2022-06-17 MED ORDER — FUROSEMIDE 10 MG/ML IJ SOLN
80.0000 mg | Freq: Two times a day (BID) | INTRAMUSCULAR | Status: DC
  Administered 2022-06-17 – 2022-06-19 (×4): 80 mg via INTRAVENOUS
  Filled 2022-06-17 (×4): qty 8

## 2022-06-17 NOTE — Progress Notes (Signed)
Occupational Therapy Treatment Patient Details Name: James Clarke MRN: [id number] DOB: [birth year] Today's Date: 06/17/2022   History of present illness presented to ER secondary to progressive SOB; admitted for management of acute/chronic CHF   OT comments  Pt seen for OT tx. OT offered active listening and emotional support for pt as he expressed his frustration with his health and concerns over fluid restrictions. Pt appreciative for space to vent. Pt set up for grooming tasks and OT facilitated some problem solving for implementing routines changes at home to improve self mgt of health and ADL/mobility performance while minimizing risk of falls and over exertion. Pt verbalized understanding. Progressing towards goals and continues to benefit from skilled OT services.    Recommendations for follow up therapy are one component of a multi-disciplinary discharge planning process, led by the attending physician.  Recommendations may be updated based on patient status, additional functional criteria and insurance authorization.    Follow Up Recommendations  Home health OT     Assistance Recommended at Discharge Intermittent Supervision/Assistance  Patient can return home with the following  A little help with walking and/or transfers;A little help with bathing/dressing/bathroom;Assistance with cooking/housework;Assist for transportation;Help with stairs or ramp for entrance   Equipment Recommendations  None recommended by OT    Recommendations for Other Services      Precautions / Restrictions Precautions Precautions: Fall Restrictions Weight Bearing Restrictions: No       Mobility Bed Mobility               General bed mobility comments: NT, up in recliner    Transfers Overall transfer level: Modified independent Equipment used: Rolling walker (2 wheels)                     Balance                                           ADL either  performed or assessed with clinical judgement   ADL                                              Extremity/Trunk Assessment              Vision       Perception     Praxis      Cognition Arousal/Alertness: Awake/alert Behavior During Therapy: WFL for tasks assessed/performed Overall Cognitive Status: Within Functional Limits for tasks assessed                                 General Comments: Pt endorses frustration, tearful with situation        Exercises Other Exercises Other Exercises: OT offered active listening and emotional support for pt as he expressed his frustration with his health and concerns over fluid restrictions. Pt set up for grooming tasks and OT facilitated some problem solving for implementing routines changes at home to improve self mgt of health and ADL/mobility performance while minimizing risk of falls and over exertion.    Shoulder Instructions       General Comments      Pertinent Vitals/ Pain       Pain Assessment Pain Assessment: No/denies  pain  Home Living                                          Prior Functioning/Environment              Frequency  Min 2X/week        Progress Toward Goals  OT Goals(current goals can now be found in the care plan section)  Progress towards OT goals: Progressing toward goals  Acute Rehab OT Goals Patient Stated Goal: go home OT Goal Formulation: With patient/family Time For Goal Achievement: 06/28/22 Potential to Achieve Goals: Good  Plan Discharge plan remains appropriate;Frequency remains appropriate    Co-evaluation                 AM-PAC OT "6 Clicks" Daily Activity     Outcome Measure   Help from another person eating meals?: None Help from another person taking care of personal grooming?: A Little Help from another person toileting, which includes using toliet, bedpan, or urinal?: A Little Help from another  person bathing (including washing, rinsing, drying)?: A Little Help from another person to put on and taking off regular upper body clothing?: None Help from another person to put on and taking off regular lower body clothing?: A Little 6 Click Score: 20    End of Session    OT Visit Diagnosis: Other abnormalities of gait and mobility (R26.89)   Activity Tolerance Patient tolerated treatment well   Patient Left in chair;with call bell/phone within reach   Nurse Communication Other (comment) (Checked with RN that pt could have 1 cup of ice water)        Time: 2080-2233 OT Time Calculation (min): 23 min  Charges: OT General Charges $OT Visit: 1 Visit OT Treatments $Self Care/Home Management : 8-22 mins  Ardeth Perfect., MPH, MS, OTR/L ascom [phone] 06/17/22, 3:05 PM

## 2022-06-17 NOTE — Progress Notes (Signed)
James Clarke  MRN: [id number]  DOB/AGE: 09/06/1940 81 y.o.  Primary Care Physician:Feldpausch, Chrissie Noa, MD  Admit date: 06/12/2022  Chief Complaint:  Chief Complaint  Patient presents with   Shortness of Breath    S-Pt presented on  06/12/2022 with  Chief Complaint  Patient presents with   Shortness of Breath  .  Patient is 81 year old Caucasian male with a past medical history diabetes mellitus type 2, CKD stage IV, morbid obesity, hypertension, obstructive sleep apnea, nephrolithiasis, chronic diastolic CHF who came to the ER with chief complaint of shortness of breath. Upon evaluation in the ER patient was found to be in fluid overload and was admitted. Patient is well-known to nephrology practice. Patient follows up with Dr. Holley Raring as an outpatient.  Patient last visit was on April 23, 2022 with a plan to follow-up in a month.  Patient seen sitting at side of bed, wife at bedside Patient on room air Tolerating meals without nausea and vomiting Patient states lower extremity edema improved since admission  Medications  amLODipine  10 mg Oral Daily   aspirin EC  81 mg Oral Daily   calcitRIOL  0.25 mcg Oral Daily   furosemide  80 mg Intravenous BID   heparin  5,000 Units Subcutaneous Q8H   hydrALAZINE  50 mg Oral TID   insulin aspart  0-20 Units Subcutaneous TID WC   insulin aspart  5 Units Subcutaneous TID WC   insulin detemir  40 Units Subcutaneous QHS   isosorbide mononitrate  30 mg Oral Daily   metoprolol succinate  50 mg Oral Daily   pravastatin  40 mg Oral q1800   sodium chloride flush  3 mL Intravenous Q12H   tamsulosin  0.4 mg Oral Daily    MLY:YTKPT from the symptoms mentioned above,there are no other symptoms referable to all systems reviewed.  Physical Exam: Vital signs in last 24 hours: Temp:  [97.6 F (36.4 C)-98.2 F (36.8 C)] 98.2 F (36.8 C) (11/27 1515) Pulse Rate:  [66-77] 73 (11/27 1515) Resp:  [18-24] 18 (11/27 1515) BP:  (127-137)/(59-70) 127/65 (11/27 1515) SpO2:  [94 %-100 %] 99 % (11/27 1515) Weight:  [141.4 kg] 141.4 kg (11/27 1306) Weight change:  Last BM Date : 06/17/22  Intake/Output from previous day: 11/26 0701 - 11/27 0700 In: -  Out: 300 [Urine:300] Total I/O In: 240 [P.O.:240] Out: 1000 [Urine:1000]   Physical Exam:  General- pt is awake,alert, oriented to time place and person  HEENT-head is atraumatic normocephalic sclera is anicteric  Resp- No acute REsp distress, Decreased breath sounds at bases  CVS- S1S2 regular in rate and rhythm  GIT- BS+, soft, Non tender , Non distended  EXT- 1+ LE Edema,  No Cyanosis    Lab Results:  CBC  Recent Labs    06/15/22 0445 06/16/22 0515  WBC 9.5 9.7  HGB 9.9* 10.0*  HCT 30.6* 30.7*  PLT 271 274     BMET  Recent Labs    06/16/22 0515 06/17/22 0441  NA 141 141  K 3.4* 3.4*  CL 105 104  CO2 27 26  GLUCOSE 197* 207*  BUN 73* 74*  CREATININE 3.58* 3.55*  CALCIUM 8.3* 8.4*       Most recent Creatinine trend  Lab Results  Component Value Date   CREATININE 3.55 (H) 06/17/2022   CREATININE 3.58 (H) 06/16/2022   CREATININE 3.55 (H) 06/15/2022      MICRO   Recent Results (from the past 240 hour(s))  Resp Panel by RT-PCR (Flu A&B, Covid) Anterior Nasal Swab     Status: None   Collection Time: 06/12/22  2:20 PM   Specimen: Anterior Nasal Swab  Result Value Ref Range Status   SARS Coronavirus 2 by RT PCR NEGATIVE NEGATIVE Final    Comment: (NOTE) SARS-CoV-2 target nucleic acids are NOT DETECTED.  The SARS-CoV-2 RNA is generally detectable in upper respiratory specimens during the acute phase of infection. The lowest concentration of SARS-CoV-2 viral copies this assay can detect is 138 copies/mL. A negative result does not preclude SARS-Cov-2 infection and should not be used as the sole basis for treatment or other patient management decisions. A negative result may occur with  improper specimen  collection/handling, submission of specimen other than nasopharyngeal swab, presence of viral mutation(s) within the areas targeted by this assay, and inadequate number of viral copies(<138 copies/mL). A negative result must be combined with clinical observations, patient history, and epidemiological information. The expected result is Negative.  Fact Sheet for Patients:  EntrepreneurPulse.com.au  Fact Sheet for Healthcare Providers:  IncredibleEmployment.be  This test is no t yet approved or cleared by the [place] FDA and  has been authorized for detection and/or diagnosis of SARS-CoV-2 by FDA under an Emergency Use Authorization (EUA). This EUA will remain  in effect (meaning this test can be used) for the duration of the COVID-19 declaration under Section 564(b)(1) of the Act, 21 U.S.C.section 360bbb-3(b)(1), unless the authorization is terminated  or revoked sooner.       Influenza A by PCR NEGATIVE NEGATIVE Final   Influenza B by PCR NEGATIVE NEGATIVE Final    Comment: (NOTE) The Xpert Xpress SARS-CoV-2/FLU/RSV plus assay is intended as an aid in the diagnosis of influenza from Nasopharyngeal swab specimens and should not be used as a sole basis for treatment. Nasal washings and aspirates are unacceptable for Xpert Xpress SARS-CoV-2/FLU/RSV testing.  Fact Sheet for Patients: EntrepreneurPulse.com.au  Fact Sheet for Healthcare Providers: IncredibleEmployment.be  This test is not yet approved or cleared by the [place] FDA and has been authorized for detection and/or diagnosis of SARS-CoV-2 by FDA under an Emergency Use Authorization (EUA). This EUA will remain in effect (meaning this test can be used) for the duration of the COVID-19 declaration under Section 564(b)(1) of the Act, 21 U.S.C. section 360bbb-3(b)(1), unless the authorization is terminated or revoked.  Performed at [facility], [address] [place], [place] [address]      Impression:  Patient is 81 year old Caucasian male with a past medical history of diabetes mellitus, proteinuria, CKD stage IV, morbid obesity, hypertension, obstructive sleep apnea, nephrolithiasis, chronic diastolic CHF who is admitted with Acute on chronic diastolic CHF  1)Renal   Acute kidney injury on chronic kidney disease stage IV. Patient has AKI secondary to ATN and diuretics Patient has CKD stage IV secondary to diabetes mellitus.  Patient did require renal placement therapy during his admission in August 2023.  Patient experienced renal recovery and dialysis was discontinued.  Discussed with patient and wife the possible need to continue dialysis to manage fluid volume.  We will continue to monitor labs.  Patient and wife agreeable to proceed, if needed.  Patient has had multiple admissions for fluid volume overload despite multiple oral diuretic outpatient treatments.  2) hypertension with chronic kidney disease.  Home regimen includes amlodipine, furosemide, hydralazine and metoprolol.  Patient currently prescribed these medications along with isosorbide.  Attempt to transition patient to oral furosemide failed and patient replaced  on IV furosemide 80 mg twice daily.  We will continue this for 1 to 2 days.  We will attempt at that time to transition to oral diuretics.   3)Anemia of chronic kidney disease     Latest Ref Rng & Units 06/16/2022    5:15 AM 06/15/2022    4:45 AM 06/14/2022    4:47 AM  CBC  WBC 4.0 - 10.5 K/uL 9.7  9.5  10.9   Hemoglobin 13.0 - 17.0 g/dL 10.0  9.9  10.2   Hematocrit 39.0 - 52.0 % 30.7  30.6  32.1   Platelets 150 - 400 K/uL 274  271  269        HGb at goal (9--11).  We will continue to monitor   4) Secondary hyperparathyroidism -CKD Mineral-Bone Disorder    Lab Results  Component Value Date   CALCIUM 8.4 (L) 06/17/2022   CAION 1.19 09/11/2020   PHOS 4.3 (H) 04/10/2022     Secondary Hyperparathyroidism present . Patient intact PTH as an outpatient was 148 Calcium and phosphorus within acceptable range.  Continue calcitriol at this time.  [place] Emalina Dubreuil 06/17/2022, 4:39 PM

## 2022-06-17 NOTE — Assessment & Plan Note (Addendum)
Hemoglobin 10.  We will send off a ferritin.

## 2022-06-17 NOTE — Inpatient Diabetes Management (Signed)
Inpatient Diabetes Program Recommendations  AACE/ADA: New Consensus Statement on Inpatient Glycemic Control   Target Ranges:  Prepandial:   less than 140 mg/dL      Peak postprandial:   less than 180 mg/dL (1-2 hours)      Critically ill patients:  140 - 180 mg/dL    Latest Reference Range & Units 06/16/22 08:45 06/16/22 12:25 06/16/22 15:29 06/16/22 16:23 06/16/22 20:54 06/17/22 08:04  Glucose-Capillary 70 - 99 mg/dL 154 (H) 238 (H) 218 (H) 210 (H) 243 (H) 208 (H)   Review of Glycemic Control  Diabetes history: DM2 Outpatient Diabetes medications: Levemir 50 units QHS, Novolog 30 units with breakfast, Novolog 30 units with lunch, Novolog 55 units with supper Current orders for Inpatient glycemic control: Levemir 40 units QHS, Novolog 0-20 units TID with meals  Inpatient Diabetes Program Recommendations:    Insulin: Please consider ordering Novolog 5 units TID with meals for meal coverage if patient eats at least 50% of meals.  Thanks, Barnie Alderman, RN, MSN, [place] [facility] Coordinator Inpatient Diabetes Program [phone] (Team Pager from 8am to [place])

## 2022-06-17 NOTE — Progress Notes (Signed)
Progress Note   Patient: James Clarke JJK:[id number] DOB: 1941/03/06 DOA: 06/12/2022     5 DOS: the patient was seen and examined on 06/17/2022   Brief hospital course: James Clarke is a 81 y.o. male with medical history significant for diabetes mellitus with complications of stage IV chronic kidney disease, morbid obesity (BMI 46.49 kg/m2), hypertension, obstructive sleep apnea, history of nephrolithiasis, chronic diastolic dysfunction CHF with last known LVEF of 55 to 60% from 08/23 who presents to the ER via EMS for evaluation of worsening shortness of breath.  Patient states that he has had shortness of breath for several days but worse 1 day prior to his admission.  He states that he slept in his recliner with his CPAP because he could not breathe laying flat.  He also complains of midsternal chest pain which he describes as feeling like someone is sitting on his chest.  He rates it a 7 x 10 in intensity at its worst.  It is nonradiating and he denies having any nausea, no vomiting, no palpitations or diaphoresis.  He has mild lower extremity swelling.  He has been taking his Lasix as prescribed every other day but admits to drinking too much fluids.  His wife notes that his urine output has reduced despite taking the diuretics. He denies having any cough, no fever, no chills, no headache, no abdominal pain, no changes in his bowel habits, no dizziness, no lightheadedness or blurred vision. Labs show elevated BNP level Chest x-ray reviewed by me shows enlargement of cardiac silhouette with bibasilar infiltrates question pulmonary edema. Small associated LEFT pleural effusion Twelve-lead EKG reviewed by me shows normal sinus rhythm with sinus arrhythmia.  Nonspecific ST and T wave changes Patient received Lasix 60 mg IV in the ER and will be admitted to the hospital for further evaluation  Patient was diuresed with IV Lasix past few days.  Switch to oral Lasix on 06/16/2022.  Will end up  giving another IV dose of Lasix on 06/16/2022.  Assessment and Plan: * Acute on chronic diastolic CHF (congestive heart failure) (HCC) EF 55 to 60%.  Case discussed with nephrology will go back to Lasix 80 mg IV twice daily to try to diurese more fluid.  The patient with increased weight today with oral Lasix and 1 dose of IV Lasix yesterday.  Fluid restrict. Continue Toprol  Acute renal failure superimposed on chronic kidney disease ([place]) Creatinine as low as 2.90 on 06/12/2022.  Creatinine 3.55 today.  Type 2 diabetes mellitus with renal manifestations (HCC) Chronic kidney disease stage IV.  Decreased dose of long-acting insulin with elevation of creatinine to 3.58.  Add 5 units of short acting insulin plus sliding scale.  Hypertension Continue amlodipine, metoprolol and hydralazine.  Sleep apnea Secondary to morbid obesity Continue CPAP at bedtime  Anemia in chronic kidney disease Hemoglobin 10.  We will send off a ferritin.  Morbid obesity due to excess calories (HCC) Last BMI 44.72        Subjective: Patient had chest pain yesterday afternoon.  He states he gets this when he has fluid on him.  Still feels short of breath.  Still very limited with activity  Physical Exam: Vitals:   06/17/22 0808 06/17/22 1234 06/17/22 1306 06/17/22 1515  BP: (!) 137/59 132/70  127/65  Pulse: 76 77  73  Resp: 19 (!) 24  18  Temp: 97.7 F (36.5 C) 98 F (36.7 C)  98.2 F (36.8 C)  TempSrc: Oral  SpO2: 94% 94%  99%  Weight:   (!) 141.4 kg   Height:       Physical Exam HENT:     Head: Normocephalic.     Mouth/Throat:     Pharynx: No oropharyngeal exudate.  Eyes:     General: Lids are normal.     Conjunctiva/sclera: Conjunctivae normal.  Cardiovascular:     Rate and Rhythm: Normal rate and regular rhythm.     Heart sounds: Normal heart sounds, S1 normal and S2 normal.  Pulmonary:     Breath sounds: Examination of the right-lower field reveals decreased breath sounds.  Examination of the left-lower field reveals decreased breath sounds. Decreased breath sounds present. No wheezing, rhonchi or rales.  Abdominal:     Palpations: Abdomen is soft.     Tenderness: There is no abdominal tenderness.  Musculoskeletal:     Right lower leg: Swelling present.     Left lower leg: Swelling present.  Skin:    General: Skin is warm.     Findings: No rash.  Neurological:     Mental Status: He is alert and oriented to person, place, and time.     Data Reviewed: Creatinine 3.55 with a GFR of 17, hemoglobin 10  Family Communication: Spoke with wife at the bedside  Disposition: Status is: Inpatient Remains inpatient appropriate because: Switching back to IV Lasix for further diuresis  Planned Discharge Destination: Home    Time spent: 28 minutes  Author: Loletha Grayer, MD 06/17/2022 4:17 PM  For on call review [email or web address].

## 2022-06-17 NOTE — Progress Notes (Signed)
[facility] Cardiology    SUBJECTIVE: Patient still has some dyspnea shortness of breath worsening renal insufficiency persistent dyspnea no chest pain mild leg edema   Vitals:   06/16/22 1540 06/16/22 2051 06/17/22 0447 06/17/22 0808  BP: (!) 141/68 (!) 132/59 (!) 135/59 (!) 137/59  Pulse: 74 72 66 76  Resp:    19  Temp: 98.4 F (36.9 C) 98 F (36.7 C) 97.6 F (36.4 C) 97.7 F (36.5 C)  TempSrc:  Oral Axillary Oral  SpO2: 97% 97% 100% 94%  Weight:      Height:         Intake/Output Summary (Last 24 hours) at 06/17/2022 0930 Last data filed at 06/17/2022 0757 Gross per 24 hour  Intake --  Output 950 ml  Net -950 ml      PHYSICAL EXAM  General: Well developed, well nourished, in no acute distress HEENT:  Normocephalic and atramatic Neck:  No JVD.  Lungs: Clear bilaterally to auscultation and percussion. Heart: HRRR . Normal S1 and S2 without gallops or murmurs.  Abdomen: Bowel sounds are positive, abdomen soft and non-tender  Msk:  Back normal, normal gait. Normal strength and tone for age. Extremities: No clubbing, cyanosis or edema.   Neuro: Alert and oriented X 3. Psych:  Good affect, responds appropriately   LABS: Basic Metabolic Panel: Recent Labs    06/16/22 0515 06/17/22 0441  NA 141 141  K 3.4* 3.4*  CL 105 104  CO2 27 26  GLUCOSE 197* 207*  BUN 73* 74*  CREATININE 3.58* 3.55*  CALCIUM 8.3* 8.4*   Liver Function Tests: No results for input(s): "AST", "ALT", "ALKPHOS", "BILITOT", "PROT", "ALBUMIN" in the last 72 hours. No results for input(s): "LIPASE", "AMYLASE" in the last 72 hours. CBC: Recent Labs    06/15/22 0445 06/16/22 0515  WBC 9.5 9.7  HGB 9.9* 10.0*  HCT 30.6* 30.7*  MCV 89.0 88.5  PLT 271 274   Cardiac Enzymes: No results for input(s): "CKTOTAL", "CKMB", "CKMBINDEX", "TROPONINI" in the last 72 hours. BNP: Invalid input(s): "POCBNP" D-Dimer: No results for input(s): "DDIMER" in the last 72 hours. Hemoglobin A1C: No results  for input(s): "HGBA1C" in the last 72 hours. Fasting Lipid Panel: No results for input(s): "CHOL", "HDL", "LDLCALC", "TRIG", "CHOLHDL", "LDLDIRECT" in the last 72 hours. Thyroid Function Tests: No results for input(s): "TSH", "T4TOTAL", "T3FREE", "THYROIDAB" in the last 72 hours.  Invalid input(s): "FREET3" Anemia Panel: No results for input(s): "VITAMINB12", "FOLATE", "FERRITIN", "TIBC", "IRON", "RETICCTPCT" in the last 72 hours.  No results found.   Echo-ventricular function EF around 60%  TELEMETRY: Normal sinus rhythm rate around 75 nonspecific ST-T changes:  ASSESSMENT AND PLAN:  Principal Problem:   Acute on chronic diastolic CHF (congestive heart failure) (HCC) Active Problems:   Type 2 diabetes mellitus with renal manifestations (HCC)   Sleep apnea   Morbid obesity due to excess calories (HCC)   Hypertension   Acute renal failure superimposed on chronic kidney disease (HCC)   Chest pain at rest    Plan Acute on chronic diastolic congestive heart failure preserved left ventricular function around 60% continue IV diuresis with Lasix we will switch to p.o. continue beta-blockers Renal insufficiency acute on chronic we will consider nephrology input to help with management Diabetes type 2 with renal involvement continue diabetes medications patient is stage IV we will try to avoid nephrotoxic drugs Obstructive sleep apnea recommend sleep study CPAP significant weight loss Hypertension reasonable control maintain on amlodipine metoprolol hydralazine therapy poor candidate  for ACE ARB or Arni because of renal insufficiency Obesity recommend weight loss exercise portion control     Yolonda Kida, MD, 06/17/2022 9:30 AM

## 2022-06-17 NOTE — Progress Notes (Signed)
Physical Therapy Treatment Patient Details Name: James Clarke MRN: [id number] DOB: [birth year] Today's Date: 06/17/2022   History of Present Illness presented to ER secondary to progressive SOB; admitted for management of acute/chronic CHF    PT Comments    Pt excited for gait this pm.  Stood and is able to complete x 1 lap with RW and supervision.  Generally steady with gait but does take self initiated rest breaks due to SOB.  Sats 95 % on room air after 160' gait.  Pt is left in bathroom with wife and RN aware at end of session.  Pt has RW at home for discharge.   Recommendations for follow up therapy are one component of a multi-disciplinary discharge planning process, led by the attending physician.  Recommendations may be updated based on patient status, additional functional criteria and insurance authorization.  Follow Up Recommendations  Home health PT     Assistance Recommended at Discharge Intermittent Supervision/Assistance  Patient can return home with the following A little help with walking and/or transfers;A little help with bathing/dressing/bathroom;Help with stairs or ramp for entrance   Equipment Recommendations       Recommendations for Other Services       Precautions / Restrictions Precautions Precautions: Fall Restrictions Weight Bearing Restrictions: No     Mobility  Bed Mobility Overal bed mobility: Modified Independent                  Transfers Overall transfer level: Modified independent Equipment used: Rolling walker (2 wheels) Transfers: Sit to/from Stand Sit to Stand: Supervision                Ambulation/Gait Ambulation/Gait assistance: Min guard, Supervision Gait Distance (Feet): 200 Feet Assistive device: Rolling walker (2 wheels) Gait Pattern/deviations: Step-through pattern Gait velocity: decreased with self initiated rest breaks as needed.     General Gait Details: some SOB but sats 95% on room air after  160'   Stairs             Wheelchair Mobility    Modified Rankin (Stroke Patients Only)       Balance Overall balance assessment: Needs assistance Sitting-balance support: No upper extremity supported, Feet supported Sitting balance-Leahy Scale: Good     Standing balance support: Bilateral upper extremity supported Standing balance-Leahy Scale: Good Standing balance comment: walksin bathroom with no AD with wall finger tip support for balance                            Cognition Arousal/Alertness: Awake/alert   Overall Cognitive Status: Within Functional Limits for tasks assessed                                          Exercises      General Comments        Pertinent Vitals/Pain Pain Assessment Pain Assessment: No/denies pain    Home Living                          Prior Function            PT Goals (current goals can now be found in the care plan section) Progress towards PT goals: Progressing toward goals    Frequency    Min 2X/week      PT Plan Current plan  remains appropriate    Co-evaluation              AM-PAC PT "6 Clicks" Mobility   Outcome Measure  Help needed turning from your back to your side while in a flat bed without using bedrails?: None Help needed moving from lying on your back to sitting on the side of a flat bed without using bedrails?: None Help needed moving to and from a bed to a chair (including a wheelchair)?: None Help needed standing up from a chair using your arms (e.g., wheelchair or bedside chair)?: None Help needed to walk in hospital room?: None Help needed climbing 3-5 steps with a railing? : A Little 6 Click Score: 23    End of Session Equipment Utilized During Treatment: Gait belt Activity Tolerance: Patient tolerated treatment well Patient left: Other (comment) (bathroom with wife.  RN aware.) Nurse Communication: Mobility status PT Visit Diagnosis:  Muscle weakness (generalized) (M62.81);Difficulty in walking, not elsewhere classified (R26.2)     Time: 8616-8372 PT Time Calculation (min) (ACUTE ONLY): 9 min  Charges:  $Gait Training: 8-22 mins                   Chesley Noon, PTA 06/17/22, 1:32 PM

## 2022-06-18 DIAGNOSIS — N179 Acute kidney failure, unspecified: Secondary | ICD-10-CM | POA: Diagnosis not present

## 2022-06-18 DIAGNOSIS — N184 Chronic kidney disease, stage 4 (severe): Secondary | ICD-10-CM | POA: Diagnosis not present

## 2022-06-18 DIAGNOSIS — E1122 Type 2 diabetes mellitus with diabetic chronic kidney disease: Secondary | ICD-10-CM | POA: Diagnosis not present

## 2022-06-18 DIAGNOSIS — I5033 Acute on chronic diastolic (congestive) heart failure: Secondary | ICD-10-CM | POA: Diagnosis not present

## 2022-06-18 LAB — BASIC METABOLIC PANEL
Anion gap: 10 (ref 5–15)
BUN: 72 mg/dL — ABNORMAL HIGH (ref 8–23)
CO2: 26 mmol/L (ref 22–32)
Calcium: 7.4 mg/dL — ABNORMAL LOW (ref 8.9–10.3)
Chloride: 105 mmol/L (ref 98–111)
Creatinine, Ser: 3.71 mg/dL — ABNORMAL HIGH (ref 0.61–1.24)
GFR, Estimated: 16 mL/min — ABNORMAL LOW (ref >60)
Glucose, Bld: 167 mg/dL — ABNORMAL HIGH (ref 70–99)
Potassium: 4.4 mmol/L (ref 3.5–5.1)
Sodium: 141 mmol/L (ref 135–145)

## 2022-06-18 LAB — GLUCOSE, CAPILLARY
Glucose-Capillary: 150 mg/dL — ABNORMAL HIGH (ref 70–99)
Glucose-Capillary: 269 mg/dL — ABNORMAL HIGH (ref 70–99)
Glucose-Capillary: 174 mg/dL — ABNORMAL HIGH (ref 70–99)
Glucose-Capillary: 167 mg/dL — ABNORMAL HIGH (ref 70–99)

## 2022-06-18 LAB — HEMOGLOBIN: Hemoglobin: 10 g/dL — ABNORMAL LOW (ref 13.0–17.0)

## 2022-06-18 LAB — FERRITIN: Ferritin: 149 ng/mL (ref 24–336)

## 2022-06-18 NOTE — Care Management Important Message (Signed)
Important Message  Patient Details  Name: James Clarke MRN: [id number] Date of Birth: [birth year]   Medicare Important Message Given:  Yes     [name] 06/18/2022, 12:20 PM

## 2022-06-18 NOTE — Progress Notes (Signed)
Physical Therapy Treatment Patient Details Name: James Clarke MRN: [id number] DOB: Mar 11, 1941 Today's Date: 06/18/2022   History of Present Illness presented to ER secondary to progressive SOB; admitted for management of acute/chronic CHF    PT Comments    Completes x 2 laps on unit with RW with ease.  Vitals stable.    Recommendations for follow up therapy are one component of a multi-disciplinary discharge planning process, led by the attending physician.  Recommendations may be updated based on patient status, additional functional criteria and insurance authorization.  Follow Up Recommendations  Home health PT     Assistance Recommended at Discharge Intermittent Supervision/Assistance  Patient can return home with the following A little help with walking and/or transfers;A little help with bathing/dressing/bathroom;Help with stairs or ramp for entrance   Equipment Recommendations       Recommendations for Other Services       Precautions / Restrictions Precautions Precautions: Fall Restrictions Weight Bearing Restrictions: No     Mobility  Bed Mobility Overal bed mobility: Modified Independent                  Transfers Overall transfer level: Modified independent Equipment used: Rolling walker (2 wheels) Transfers: Sit to/from Stand Sit to Stand: Supervision                Ambulation/Gait Ambulation/Gait assistance: Supervision Gait Distance (Feet): 380 Feet Assistive device: Rolling walker (2 wheels) Gait Pattern/deviations: Step-through pattern       General Gait Details: sats 96% on room air after gait   Stairs             Wheelchair Mobility    Modified Rankin (Stroke Patients Only)       Balance Overall balance assessment: Needs assistance Sitting-balance support: No upper extremity supported, Feet supported Sitting balance-Leahy Scale: Good     Standing balance support: Bilateral upper extremity  supported Standing balance-Leahy Scale: Good                              Cognition Arousal/Alertness: Awake/alert Behavior During Therapy: WFL for tasks assessed/performed Overall Cognitive Status: Within Functional Limits for tasks assessed                                          Exercises      General Comments        Pertinent Vitals/Pain Pain Assessment Pain Assessment: No/denies pain    Home Living                          Prior Function            PT Goals (current goals can now be found in the care plan section) Progress towards PT goals: Progressing toward goals    Frequency    Min 2X/week      PT Plan Current plan remains appropriate    Co-evaluation              AM-PAC PT "6 Clicks" Mobility   Outcome Measure  Help needed turning from your back to your side while in a flat bed without using bedrails?: None Help needed moving from lying on your back to sitting on the side of a flat bed without using bedrails?: None Help needed moving to and from a  bed to a chair (including a wheelchair)?: None Help needed standing up from a chair using your arms (e.g., wheelchair or bedside chair)?: None Help needed to walk in hospital room?: None Help needed climbing 3-5 steps with a railing? : A Little 6 Click Score: 23    End of Session Equipment Utilized During Treatment: Gait belt Activity Tolerance: Patient tolerated treatment well Patient left: Other (comment) (bathroom with wife.  RN aware.) Nurse Communication: Mobility status PT Visit Diagnosis: Muscle weakness (generalized) (M62.81);Difficulty in walking, not elsewhere classified (R26.2)     Time: 4514-6047 PT Time Calculation (min) (ACUTE ONLY): 12 min  Charges:  $Gait Training: 8-22 mins                   Chesley Noon, PTA 06/18/22, 12:43 PM

## 2022-06-18 NOTE — Progress Notes (Signed)
Progress Note   Patient: James Clarke VOZ:[id number] DOB: [birth year] DOA: 06/12/2022     6 DOS: the patient was seen and examined on 06/18/2022   Brief hospital course: James Clarke is a 81 y.o. male with medical history significant for diabetes mellitus with complications of stage IV chronic kidney disease, morbid obesity (BMI 46.49 kg/m2), hypertension, obstructive sleep apnea, history of nephrolithiasis, chronic diastolic dysfunction CHF with last known LVEF of 55 to 60% from 08/23 who presents to the ER via EMS for evaluation of worsening shortness of breath.  Patient states that he has had shortness of breath for several days but worse 1 day prior to his admission.  He states that he slept in his recliner with his CPAP because he could not breathe laying flat.  He also complains of midsternal chest pain which he describes as feeling like someone is sitting on his chest.  He rates it a 7 x 10 in intensity at its worst.  It is nonradiating and he denies having any nausea, no vomiting, no palpitations or diaphoresis.  He has mild lower extremity swelling.  He has been taking his Lasix as prescribed every other day but admits to drinking too much fluids.  His wife notes that his urine output has reduced despite taking the diuretics. He denies having any cough, no fever, no chills, no headache, no abdominal pain, no changes in his bowel habits, no dizziness, no lightheadedness or blurred vision. Labs show elevated BNP level Chest x-ray reviewed by me shows enlargement of cardiac silhouette with bibasilar infiltrates question pulmonary edema. Small associated LEFT pleural effusion Twelve-lead EKG reviewed by me shows normal sinus rhythm with sinus arrhythmia.  Nonspecific ST and T wave changes Patient received Lasix 60 mg IV in the ER and will be admitted to the hospital for further evaluation  Patient was diuresed with IV Lasix during hospital course.  Switched to oral Lasix on 06/16/2022.   Will end up giving another IV dose of Lasix on 06/16/2022.  Patient switched back to IV Lasix 80 mg IV twice daily on 06/17/2022.  Assessment and Plan: * Acute on chronic diastolic CHF (congestive heart failure) (HCC) EF 55 to 60%.  Case discussed with nephrology and will continue Lasix 80 mg IV twice daily today and may consider oral Lasix tomorrow.  Fluid restrict. Continue Toprol.  The patient had a nice decrease in weight from yesterday to today down to 305.58 pounds today.  Acute renal failure superimposed on chronic kidney disease ([place]) AKI on CKD stage IV.  Creatinine as low as 2.90 on 06/12/2022.  Creatinine 3.71 today.  Type 2 diabetes mellitus with renal manifestations (HCC) Chronic kidney disease stage IV.  Continue decreased dose of long-acting insulin with elevation of creatinine to.  Add 5 units of short acting insulin plus sliding scale.  Hypertension Continue amlodipine, metoprolol and hydralazine.  Sleep apnea Secondary to morbid obesity Continue CPAP at bedtime  Anemia in chronic kidney disease Hemoglobin 10.  Ferritin 149  Morbid obesity due to excess calories (HCC) Last BMI 43.94        Subjective: Patient stated he urinated well with the increased dose of IV Lasix yesterday.  Down on his weight this morning.  Breathing is better.  Still limited with his activity.  Let me know that he was frustrated about his medical condition.  Physical Exam: Vitals:   06/18/22 0359 06/18/22 0500 06/18/22 0813 06/18/22 1136  BP: 132/60  136/64 (!) 150/60  Pulse: 67  66 77  Resp: '15  16 18  '$ Temp: 98.8 F (37.1 C)  97.7 F (36.5 C) (!) 97.5 F (36.4 C)  TempSrc: Oral     SpO2: 96%  100% 98%  Weight:  (!) 138.9 kg    Height:       Physical Exam HENT:     Head: Normocephalic.     Mouth/Throat:     Pharynx: No oropharyngeal exudate.  Eyes:     General: Lids are normal.     Conjunctiva/sclera: Conjunctivae normal.  Cardiovascular:     Rate and Rhythm: Normal rate  and regular rhythm.     Heart sounds: Normal heart sounds, S1 normal and S2 normal.  Pulmonary:     Breath sounds: Examination of the right-lower field reveals decreased breath sounds. Examination of the left-lower field reveals decreased breath sounds. Decreased breath sounds present. No wheezing, rhonchi or rales.  Abdominal:     Palpations: Abdomen is soft.     Tenderness: There is no abdominal tenderness.  Musculoskeletal:     Right lower leg: Swelling present.     Left lower leg: Swelling present.  Skin:    General: Skin is warm.     Findings: No rash.  Neurological:     Mental Status: He is alert and oriented to person, place, and time.     Data Reviewed: Creatinine 3.71 with a BUN of 72, GFR 16  Family Communication: Spoke with wife at the bedside  Disposition: Status is: Inpatient Remains inpatient appropriate because: Remains on IV Lasix today may be able to switch over to oral tomorrow  Planned Discharge Destination: Home    Time spent: 27 minutes  Author: Loletha Grayer, MD 06/18/2022 3:37 PM  For on call review [email or web address].

## 2022-06-18 NOTE — Progress Notes (Signed)
James Clarke  MRN: [id number]  DOB/AGE: 1941/01/31 81 y.o.  Primary Care Physician:Feldpausch, Chrissie Noa, MD  Admit date: 06/12/2022  Chief Complaint:  Chief Complaint  Patient presents with   Shortness of Breath    S-Pt presented on  06/12/2022 with  Chief Complaint  Patient presents with   Shortness of Breath  .  Patient is 81 year old Caucasian male with a past medical history diabetes mellitus type 2, CKD stage IV, morbid obesity, hypertension, obstructive sleep apnea, nephrolithiasis, chronic diastolic CHF who came to the ER with chief complaint of shortness of breath. Upon evaluation in the ER patient was found to be in fluid overload and was admitted. Patient is well-known to nephrology practice. Patient follows up with Dr. Holley Raring as an outpatient.  Patient last visit was on April 23, 2022 with a plan to follow-up in a month.  Update: Patient seen sitting up in chair Wife at bedside Voices frustration about fluid restriction and conflicting information.   Medications  amLODipine  10 mg Oral Daily   aspirin EC  81 mg Oral Daily   calcitRIOL  0.25 mcg Oral Daily   furosemide  80 mg Intravenous BID   heparin  5,000 Units Subcutaneous Q8H   hydrALAZINE  50 mg Oral TID   insulin aspart  0-20 Units Subcutaneous TID WC   insulin aspart  5 Units Subcutaneous TID WC   insulin detemir  40 Units Subcutaneous QHS   isosorbide mononitrate  30 mg Oral Daily   metoprolol succinate  50 mg Oral Daily   pravastatin  40 mg Oral q1800   sodium chloride flush  3 mL Intravenous Q12H   tamsulosin  0.4 mg Oral Daily    MGQ:QPYPP from the symptoms mentioned above,there are no other symptoms referable to all systems reviewed.  Physical Exam: Vital signs in last 24 hours: Temp:  [97.5 F (36.4 C)-98.8 F (37.1 C)] 97.5 F (36.4 C) (11/28 1136) Pulse Rate:  [66-77] 77 (11/28 1136) Resp:  [14-20] 18 (11/28 1136) BP: (125-150)/(52-64) 150/60 (11/28 1136) SpO2:  [92 %-100 %] 98 %  (11/28 1136) Weight:  [138.9 kg] 138.9 kg (11/28 0500) Weight change:  Last BM Date : 06/17/22  Intake/Output from previous day: 11/27 0701 - 11/28 0700 In: 480 [P.O.:480] Out: 2850 [Urine:2850] No intake/output data recorded.   Physical Exam:  General- pt is awake,alert, oriented to time place and person  HEENT-head is atraumatic normocephalic sclera is anicteric  Resp- No acute REsp distress, Decreased breath sounds at bases  CVS- S1S2 regular in rate and rhythm  GIT- BS+, soft, Non tender , Non distended  EXT- 1+ LE Edema,  No Cyanosis    Lab Results:  CBC  Recent Labs    06/16/22 0515 06/18/22 0554  WBC 9.7  --   HGB 10.0* 10.0*  HCT 30.7*  --   PLT 274  --      BMET  Recent Labs    06/17/22 0441 06/18/22 0554  NA 141 141  K 3.4* 4.4  CL 104 105  CO2 26 26  GLUCOSE 207* 167*  BUN 74* 72*  CREATININE 3.55* 3.71*  CALCIUM 8.4* 7.4*       Most recent Creatinine trend  Lab Results  Component Value Date   CREATININE 3.71 (H) 06/18/2022   CREATININE 3.55 (H) 06/17/2022   CREATININE 3.58 (H) 06/16/2022      MICRO   Recent Results (from the past 240 hour(s))  Resp Panel by RT-PCR (Flu A&B, Covid)  Anterior Nasal Swab     Status: None   Collection Time: 06/12/22  2:20 PM   Specimen: Anterior Nasal Swab  Result Value Ref Range Status   SARS Coronavirus 2 by RT PCR NEGATIVE NEGATIVE Final    Comment: (NOTE) SARS-CoV-2 target nucleic acids are NOT DETECTED.  The SARS-CoV-2 RNA is generally detectable in upper respiratory specimens during the acute phase of infection. The lowest concentration of SARS-CoV-2 viral copies this assay can detect is 138 copies/mL. A negative result does not preclude SARS-Cov-2 infection and should not be used as the sole basis for treatment or other patient management decisions. A negative result may occur with  improper specimen collection/handling, submission of specimen other than nasopharyngeal swab,  presence of viral mutation(s) within the areas targeted by this assay, and inadequate number of viral copies(<138 copies/mL). A negative result must be combined with clinical observations, patient history, and epidemiological information. The expected result is Negative.  Fact Sheet for Patients:  EntrepreneurPulse.com.au  Fact Sheet for Healthcare Providers:  IncredibleEmployment.be  This test is no t yet approved or cleared by the [place] FDA and  has been authorized for detection and/or diagnosis of SARS-CoV-2 by FDA under an Emergency Use Authorization (EUA). This EUA will remain  in effect (meaning this test can be used) for the duration of the COVID-19 declaration under Section 564(b)(1) of the Act, 21 U.S.C.section 360bbb-3(b)(1), unless the authorization is terminated  or revoked sooner.       Influenza A by PCR NEGATIVE NEGATIVE Final   Influenza B by PCR NEGATIVE NEGATIVE Final    Comment: (NOTE) The Xpert Xpress SARS-CoV-2/FLU/RSV plus assay is intended as an aid in the diagnosis of influenza from Nasopharyngeal swab specimens and should not be used as a sole basis for treatment. Nasal washings and aspirates are unacceptable for Xpert Xpress SARS-CoV-2/FLU/RSV testing.  Fact Sheet for Patients: EntrepreneurPulse.com.au  Fact Sheet for Healthcare Providers: IncredibleEmployment.be  This test is not yet approved or cleared by the [place] FDA and has been authorized for detection and/or diagnosis of SARS-CoV-2 by FDA under an Emergency Use Authorization (EUA). This EUA will remain in effect (meaning this test can be used) for the duration of the COVID-19 declaration under Section 564(b)(1) of the Act, 21 U.S.C. section 360bbb-3(b)(1), unless the authorization is terminated or revoked.  Performed at [facility], [address] [place], [place] [address]       Impression:  Patient is 70 year old Caucasian male with a past medical history of diabetes mellitus, proteinuria, CKD stage IV, morbid obesity, hypertension, obstructive sleep apnea, nephrolithiasis, chronic diastolic CHF who is admitted with Acute on chronic diastolic CHF  1)Renal   Acute kidney injury on chronic kidney disease stage IV. Patient has AKI secondary to ATN and diuretics Patient has CKD stage IV secondary to diabetes mellitus.  Creatinine stable, adequate urine output noted. Will continue IV diuretics today and transition to oral tomorrow. Discussed  with wife and patient the need for fluid restriction.   2) hypertension with chronic kidney disease.  Home regimen includes amlodipine, furosemide, hydralazine and metoprolol.  Patient currently prescribed these medications along with isosorbide.  Will transition to Furosemide '40mg'$  twice daily tomorrow.    3)Anemia of chronic kidney disease     Latest Ref Rng & Units 06/18/2022    5:54 AM 06/16/2022    5:15 AM 06/15/2022    4:45 AM  CBC  WBC 4.0 - 10.5 K/uL  9.7  9.5   Hemoglobin 13.0 -  17.0 g/dL 10.0  10.0  9.9   Hematocrit 39.0 - 52.0 %  30.7  30.6   Platelets 150 - 400 K/uL  274  271    Hemoglobin at goal   4) Secondary hyperparathyroidism -CKD Mineral-Bone Disorder  Lab Results  Component Value Date   CALCIUM 7.4 (L) 06/18/2022   CAION 1.19 09/11/2020   PHOS 4.3 (H) 04/10/2022   Patient intact PTH - 148 Calcium decreased, continue calcitriol at this time.  [place] Audie Stayer 06/18/2022, 3:25 PM

## 2022-06-18 NOTE — Progress Notes (Signed)
OT Cancellation Note  Patient Details Name: James Clarke MRN: [id number] DOB: 09-06-1940   Cancelled Treatment:    Reason Eval/Treat Not Completed: Patient declined, no reason specified. Upon attempt, pt resting, expresses ongoing frustration with situation and wishing to rest. Active listening and emotional support provided. Will re-attempt OT tx at later date/time as pt is agreeable.   Ardeth Perfect., MPH, MS, OTR/L ascom [phone] 06/18/22, 3:15 PM

## 2022-06-18 NOTE — Progress Notes (Addendum)
Patient was seen evaluated and examined by me and the PA on 06/18/22.  Course of action, evaluation, and management decisions were developed solely by me, but detailed below in the PA's note. [place] NOTE       Patient ID: James Clarke MRN: [id number] DOB/AGE: Dec 22, 1940 81 y.o.  Admit date: 06/12/2022 Referring Physician Dr. Francine Graven Primary Physician Dr. Ellison Hughs Primary Cardiologist Dr. Nehemiah Massed Reason for Consultation AoCHF  HPI: James Clarke is an 60yoM with a PMH of DM2, CKD 4, HFpEF (55-60% g2dd 02/2022), nonobstructive CAD by [facility] 12/2021, morbid obesity, OSA (CPAP), hx nephrolithiasis who presented to [facility] ED 06/12/22 with shortness of breath x 3 days that acutely worsened the night before admission and associated orthopnea. Cardiology is consulted for assistance with his heart failure.  Interval History:  -diuresed a net neg of 11L since admission, on '80mg'$  IV lasix BID per nephrology -he is frustrated with seeing multiple physicians during this hospitalization (as the weekly shifts change)  -no further chest pain. Walked two laps with PT and did well.   Review of systems complete and found to be negative unless listed above     Past Medical History:  Diagnosis Date   Asthma    Bell's palsy    SEVERAL TIMES   Bell's palsy    Cancer ([place])    SKIN   Diabetes mellitus without complication ([place])    Dyspnea    DOE   History of kidney stones    Hyperlipemia    Hypertension    Kidney stone    Neuropathy    Obesity    Sleep apnea    C PAP    Past Surgical History:  Procedure Laterality Date   APPENDECTOMY     CATARACT EXTRACTION W/PHACO Left 05/20/2017   Procedure: CATARACT EXTRACTION PHACO AND INTRAOCULAR LENS PLACEMENT ([place]);  Surgeon: Birder Robson, MD;  Location: [facility] ORS;  Service: Ophthalmology;  Laterality: Left;  [place] 00:32.0 AP% 15.5 CDE 4.97 Fluid Pack Lot # [id number]   CATARACT EXTRACTION W/PHACO Right 06/10/2017    Procedure: CATARACT EXTRACTION PHACO AND INTRAOCULAR LENS PLACEMENT (I[facility]);  Surgeon: Birder Robson, MD;  Location: [facility] ORS;  Service: Ophthalmology;  Laterality: Right;  [place]  00:51 AP% 15.4 CDE 7.95 Fluid pack lot # [id number]   CHOLECYSTECTOMY     CYSTOSCOPY W/ RETROGRADES Right 08/13/2019   Procedure: CYSTOSCOPY WITH RETROGRADE PYELOGRAM;  Surgeon: Billey Co, MD;  Location: [facility] ORS;  Service: Urology;  Laterality: Right;   CYSTOSCOPY WITH STENT PLACEMENT Right 07/26/2019   Procedure: CYSTOSCOPY WITH STENT PLACEMENT;  Surgeon: Lucas Mallow, MD;  Location: [facility] OR[facility];  Service: Urology;  Laterality: Right;   CYSTOSCOPY/URETEROSCOPY/HOLMIUM LASER/STENT PLACEMENT Right 08/13/2019   Procedure: CYSTOSCOPY/URETEROSCOPY/HOLMIUM LASER/STENT EXCHANGE;  Surgeon: Billey Co, MD;  Location: [facility] OR[facility];  Service: Urology;  Laterality: Right;   CYSTOSCOPY/URETEROSCOPY/HOLMIUM LASER/STENT PLACEMENT Left 02/27/2022   Procedure: CYSTOSCOPY/URETEROSCOPY/HOLMIUM LASER/STENT PLACEMENT;  Surgeon: Abbie Sons, MD;  Location: [facility] OR[facility];  Service: Urology;  Laterality: Left;   FRACTURE SURGERY     WRIST   HAND SURGERY     HERNIA REPAIR     LEFT HEART CATH AND CORONARY ANGIOGRAPHY N/A 12/31/2021   Procedure: LEFT HEART CATH AND CORONARY ANGIOGRAPHY;  Surgeon: Corey Skains, MD;  Location: [place] [facility];  Service: Cardiovascular;  Laterality: N/A;   SHOULDER ARTHROSCOPY WITH SUBACROMIAL DECOMPRESSION AND OPEN ROTATOR C Right 09/11/2020   Procedure: Right shoulder arthroscopic rotator cuff repair vs Regeneten  patch application, subacromial decompression, and biceps tenodesis - Reche Dixon to Assist;  Surgeon: Leim Fabry, MD;  Location: [facility];  Service: Orthopedics;  Laterality: Right;   TEMPORARY DIALYSIS CATHETER N/A 03/05/2022   Procedure: TEMPORARY DIALYSIS CATHETER;  Surgeon: Katha Cabal, MD;  Location: [place] [facility];  Service: Cardiovascular;  Laterality: N/A;    TONSILLECTOMY     URETEROSCOPY WITH HOLMIUM LASER LITHOTRIPSY Left 02/27/2022   Procedure: URETEROSCOPY WITH HOLMIUM LASER LITHOTRIPSY;  Surgeon: Abbie Sons, MD;  Location: [facility];  Service: Urology;  Laterality: Left;    Medications Prior to Admission  Medication Sig Dispense Refill Last Dose   acetaminophen (TYLENOL) 500 MG tablet Take by mouth.   06/10/2022   albuterol (PROVENTIL HFA;VENTOLIN HFA) 108 (90 Base) MCG/ACT inhaler Inhale 2 puffs into the lungs every 6 (six) hours as needed for wheezing or shortness of breath.   06/11/2022   amLODipine (NORVASC) 10 MG tablet Take 1 tablet (10 mg total) by mouth daily. 30 tablet 1 06/12/2022   aspirin EC 81 MG tablet Take by mouth.   06/12/2022   calcitRIOL (ROCALTROL) 0.25 MCG capsule Take 0.25 mcg by mouth daily.   06/12/2022   fluticasone (FLONASE) 50 MCG/ACT nasal spray as needed.   06/11/2022   furosemide (LASIX) 40 MG tablet Take 1 tablet (40 mg total) by mouth every other day. 30 tablet 1 06/12/2022   hydrALAZINE (APRESOLINE) 50 MG tablet Take 50 mg by mouth 3 (three) times daily.   06/10/2022   insulin aspart (NOVOLOG) 100 UNIT/ML injection Inject 30 units before breakfast and lunch. Inject 55 units before supper.   06/12/2022 at am   insulin detemir (LEVEMIR) 100 UNIT/ML injection Inject 0.3 mLs (30 Units total) into the skin daily. (Patient taking differently: Inject 50 Units into the skin at bedtime.) 10 mL 11 06/11/2022   lovastatin (MEVACOR) 40 MG tablet Take 40 mg by mouth daily with supper.   06/11/2022   metoprolol succinate (TOPROL-XL) 50 MG 24 hr tablet Take 50 mg by mouth daily.   06/12/2022   potassium citrate (UROCIT-K) 10 MEQ (1080 MG) SR tablet Take 10 mEq by mouth in the morning and at bedtime.   06/12/2022   tamsulosin (FLOMAX) 0.4 MG CAPS capsule TAKE (1) CAPSULE BY MOUTH EVERY DAY 30 capsule 1 06/12/2022   traMADol (ULTRAM) 50 MG tablet Take 50 mg by mouth every 12 (twelve) hours as needed.   Past Week   cetirizine  (ZYRTEC) 10 MG chewable tablet Chew by mouth.   pr   GLOBAL INJECT EASE INSULIN SYR 31G X 5/16" 0.5 ML MISC Inject into the skin.      glucose blood (ONETOUCH ULTRA) test strip TEST BLOOD SUGAR 4 TIMES DAILY      Lancets (ONETOUCH DELICA PLUS [id number]) MISC USE 1 LANCET 4 TIMES DAILY      Social History   Socioeconomic History   Marital status: Married    Spouse name: Not on file   Number of children: Not on file   Years of education: Not on file   Highest education level: Not on file  Occupational History   Not on file  Tobacco Use   Smoking status: Never    Passive exposure: Never   Smokeless tobacco: Never  Vaping Use   Vaping Use: Never used  Substance and Sexual Activity   Alcohol use: No   Drug use: Not Currently   Sexual activity: Yes    Birth control/protection: None  Other Topics  Concern   Not on file  Social History Narrative   Not on file   Social Determinants of Health   Financial Resource Strain: Not on file  Food Insecurity: No Food Insecurity (06/12/2022)   Hunger Vital Sign    Worried About Running Out of Food in the Last Year: Never true    Ran Out of Food in the Last Year: Never true  Transportation Needs: No Transportation Needs (06/12/2022)   PRAPARE - Hydrologist (Medical): No    Lack of Transportation (Non-Medical): No  Physical Activity: Not on file  Stress: Not on file  Social Connections: Not on file  Intimate Partner Violence: Not At Risk (06/12/2022)   Humiliation, Afraid, Rape, and Kick questionnaire    Fear of Current or Ex-Partner: No    Emotionally Abused: No    Physically Abused: No    Sexually Abused: No    Family History  Problem Relation Age of Onset   Emphysema Mother    COPD Mother    Heart disease Mother    Brain cancer Father      Vitals:   06/17/22 2322 06/18/22 0359 06/18/22 0500 06/18/22 0813  BP: (!) 125/52 132/60  136/64  Pulse: 66 67  66  Resp: '14 15  16  '$ Temp: 98.4 F (36.9 C)  98.8 F (37.1 C)  97.7 F (36.5 C)  TempSrc:  Oral    SpO2: 92% 96%  100%  Weight:   (!) 138.9 kg   Height:        PHYSICAL EXAM General: Elderly Caucasian male, well nourished, in no acute distress.  Sitting upright with legs off hospital bed, wife at bedside. HEENT:  Normocephalic and atraumatic. Neck:  No JVD.  Lungs: Normal respiratory effort on room air.  Trace bibasilar crackles without appreciable wheezes.   Heart: HRRR . Normal S1 and S2 without gallops or murmurs.  Abdomen: Non-distended appearing with excess adiposity. Msk: Normal strength and tone for age. Extremities: Warm and well perfused. No clubbing, cyanosis.  No peripheral edema.  Neuro: Alert and oriented X 3. Psych: Dysphoric mood.  Answers questions appropriately.   Labs: Basic Metabolic Panel: Recent Labs    06/17/22 0441 06/18/22 0554  NA 141 141  K 3.4* 4.4  CL 104 105  CO2 26 26  GLUCOSE 207* 167*  BUN 74* 72*  CREATININE 3.55* 3.71*  CALCIUM 8.4* 7.4*   Liver Function Tests: No results for input(s): "AST", "ALT", "ALKPHOS", "BILITOT", "PROT", "ALBUMIN" in the last 72 hours. No results for input(s): "LIPASE", "AMYLASE" in the last 72 hours. CBC: Recent Labs    06/16/22 0515 06/18/22 0554  WBC 9.7  --   HGB 10.0* 10.0*  HCT 30.7*  --   MCV 88.5  --   PLT 274  --    Cardiac Enzymes: No results for input(s): "CKTOTAL", "CKMB", "CKMBINDEX", "TROPONINIHS" in the last 72 hours. BNP: No results for input(s): "BNP" in the last 72 hours. D-Dimer: No results for input(s): "DDIMER" in the last 72 hours. Hemoglobin A1C: No results for input(s): "HGBA1C" in the last 72 hours. Fasting Lipid Panel: No results for input(s): "CHOL", "HDL", "LDLCALC", "TRIG", "CHOLHDL", "LDLDIRECT" in the last 72 hours. Thyroid Function Tests: No results for input(s): "TSH", "T4TOTAL", "T3FREE", "THYROIDAB" in the last 72 hours.  Invalid input(s): "FREET3" Anemia Panel: Recent Labs    06/18/22 0554  FERRITIN  149     Radiology: DG Chest 1 View  Result Date: 06/14/2022  CLINICAL DATA:  Shortness of breath EXAM: CHEST  1 VIEW COMPARISON:  06/12/2022 and prior studies FINDINGS: The cardiomediastinal silhouette is unchanged. Mild interstitial opacities are again noted as well as mild bibasilar opacities/atelectasis. There is no evidence of pneumothorax or acute bony abnormality. There has been little interval change since prior study. IMPRESSION: Unchanged appearance of the chest with mild interstitial opacities and mild bibasilar opacities/atelectasis. Electronically Signed   By: Margarette Canada M.D.   On: 06/14/2022 08:46   CT CHEST WO CONTRAST  Result Date: 06/12/2022 CLINICAL DATA:  Chest pain and shortness of breath. EXAM: CT CHEST WITHOUT CONTRAST TECHNIQUE: Multidetector CT imaging of the chest was performed following the standard protocol without IV contrast. RADIATION DOSE REDUCTION: This exam was performed according to the departmental dose-optimization program which includes automated exposure control, adjustment of the mA and/or kV according to patient size and/or use of iterative reconstruction technique. COMPARISON:  CT scan from 2015. FINDINGS: Cardiovascular: The heart is normal in size for age. Small pericardial effusion noted. The aorta is normal in caliber. Scattered atherosclerotic calcifications. Extensive three-vessel coronary artery calcifications. Mediastinum/Nodes: Scattered borderline mediastinal and hilar lymph nodes, likely reactive. The esophagus is grossly normal. Lungs/Pleura: Small bilateral pleural effusions with overlying atelectasis. Changes of interstitial lung disease with peripheral subpleural reticulations, thickening of the interlobular septi, faint nodularity and extensive lower lobe predominant parenchymal calcifications. This could be due to chronic inflammation, previous infection, aspiration and less likely pulmonary alveolar microlithiasis. Patchy nodular ground-glass  opacities in the right lower lobe may suggest a more acute inflammatory process and aspiration or bronchopneumonia are possibilities. Upper Abdomen: No significant upper abdominal findings. No hepatic or adrenal gland lesions. No upper abdominal adenopathy. Scattered vascular calcifications. Musculoskeletal: No chest wall mass, supraclavicular or axillary adenopathy. The bony thorax is intact. IMPRESSION: 1. Changes of interstitial lung disease as detailed above. 2. Stable lower lobe predominant parenchymal lung calcifications as above. 3. Patchy nodular ground-glass opacities in the right lower lobe may suggest a more acute inflammatory process and aspiration or bronchopneumonia are possibilities. 4. Small bilateral pleural effusions with overlying atelectasis. 5. Small pericardial effusion. 6. Extensive three-vessel coronary artery calcifications. Electronically Signed   By: Marijo Sanes M.D.   On: 06/12/2022 16:42   DG Chest 2 View  Result Date: 06/12/2022 CLINICAL DATA:  Chest pain and shortness of breath since last night, slept in recliner with CPAP all night EXAM: CHEST - 2 VIEW COMPARISON:  09/19/2009 FINDINGS: Enlargement of cardiac silhouette. Mediastinal contours and pulmonary vascularity normal. Interstitial infiltrates at the mid to lower lungs question edema. Small LEFT pleural effusion. No upper lobe infiltrate or pneumothorax. Osseous structures demineralized. IMPRESSION: Enlargement of cardiac silhouette with bibasilar infiltrates question pulmonary edema. Small associated LEFT pleural effusion. Electronically Signed   By: Lavonia Dana M.D.   On: 06/12/2022 13:21   CT RENAL STONE STUDY  Result Date: 05/23/2022 CLINICAL DATA:  Flank pain. EXAM: CT ABDOMEN AND PELVIS WITHOUT CONTRAST TECHNIQUE: Multidetector CT imaging of the abdomen and pelvis was performed following the standard protocol without IV contrast. RADIATION DOSE REDUCTION: This exam was performed according to the departmental  dose-optimization program which includes automated exposure control, adjustment of the mA and/or kV according to patient size and/or use of iterative reconstruction technique. COMPARISON:  CT abdomen and pelvis 02/26/2022 FINDINGS: Lower chest: There is some chronic interstitial opacities in both lung bases similar to prior. There is a small pericardial effusion, similar to prior. There is also trace left pleural effusion. Hepatobiliary:  No focal liver abnormality is seen. Status post cholecystectomy. No biliary dilatation. Pancreas: Unremarkable. No pancreatic ductal dilatation or surrounding inflammatory changes. Spleen: Normal in size without focal abnormality. Adrenals/Urinary Tract: There are 3 punctate calculi in the superior pole the right kidney measuring 3 mm each. There is a cyst in the superior pole the right kidney measuring 4 cm. There is no hydronephrosis or perinephric fluid collection. No ureteral or bladder calculi are seen. The bladder and adrenal glands are within normal limits. Stomach/Bowel: Stomach is within normal limits. No evidence of bowel wall thickening, distention, or inflammatory changes. There is diffuse colonic diverticulosis. The appendix is not visualized. Vascular/Lymphatic: Aortic atherosclerosis. No enlarged abdominal or pelvic lymph nodes. Reproductive: Prostate is unremarkable. Other: There is a small fat containing umbilical hernia. No ascites. There is some subcutaneous density just beneath the skin surface in the upper left anterior abdominal wall which is unchanged, possibly scarring. Musculoskeletal: Degenerative changes affect the spine. IMPRESSION: 1. Nonobstructing right renal calculi. No hydronephrosis or ureteral calculus. 2. Colonic diverticulosis without evidence for diverticulitis. 3. Stable small pericardial effusion. 4. Trace left pleural effusion. 5. Right Bosniak II benign renal cyst measuring 4 cm. No follow-up imaging is recommended. JACR 2018 Feb; 264-273,  Management of the Incidental Renal Mass on CT, [employer] 2021; [id number], Bosniak Classification of Cystic Renal Masses, Version 2019. Aortic Atherosclerosis (ICD10-I70.0). Electronically Signed   By: Ronney Asters M.D.   On: 05/23/2022 15:40    ECHO 02/26/2022  1. Left ventricular ejection fraction, by estimation, is 55 to 60%. The  left ventricle has normal function. The left ventricle has no regional  wall motion abnormalities. The left ventricular internal cavity size was  mildly dilated. Left ventricular  diastolic parameters are consistent with Grade II diastolic dysfunction  (pseudonormalization).   2. Right ventricular systolic function is normal. The right ventricular  size is normal.   3. Left atrial size was mildly dilated.   4. Right atrial size was mildly dilated.   5. The mitral valve is normal in structure. Trivial mitral valve  regurgitation.   6. The aortic valve is grossly normal. Aortic valve regurgitation is not  visualized.   TELEMETRY reviewed by me (LT) 06/18/2022 : Sinus rhythm 70s to 80s  EKG reviewed by me: SR nonspecific TW abnormality  Data reviewed by me (LT) 06/18/2022: Nephrology note, hospitalist progress note, CBC BMP vitals telemetry I/Os  Principal Problem:   Acute on chronic diastolic CHF (congestive heart failure) (HCC) Active Problems:   Type 2 diabetes mellitus with renal manifestations ([place])   Sleep apnea   Morbid obesity due to excess calories ([place])   Hypertension   Anemia in chronic kidney disease   Acute renal failure superimposed on chronic kidney disease ([place])   Chest pain at rest    ASSESSMENT AND PLAN:  James Lent. Reinheimer is an 76yoM with a PMH of DM2, CKD 4, HFpEF (55-60% g2dd 02/2022), nonobstructive CAD by [facility] 12/2021, morbid obesity, OSA (CPAP), hx nephrolithiasis who presented to [facility] ED 06/12/22 with shortness of breath x 3 days that acutely worsened the night before admission and associated orthopnea. Cardiology is consulted for  assistance with his heart failure.  # Acute on chronic HFpEF Presented with several days of worsening shortness of breath and orthopnea.  Reported "chest pressure" associated with his dyspnea that is gotten better as he has diuresed.  Suspect this is related to pulmonary edema and not ACS.  BNP elevated at 415 on admission, diuresed a net negative  of 11 L so far.  He is frustrated with his salt and fluid restriction, he felt as though he was already compliant with this prior to admission. -Continue GDMT with metoprolol XL 50 mg once daily, imdur '30mg'$  daily, hydral 50 TID. Defer diuretics, ACE/ARB/ARNI, MRA to nephrology. -no further cardiac diagnostics necessary   # AKI on CKD 4 Required temporary dialysis during admission in August 2023.  Current BUN/creatinine 72/3.71 and GFR of 16.  Was 56/2.90 and 21 on day of admission 11/22 -Nephrology following, will defer diuretic dosing to them.  Currently on 80 mg IV Lasix twice daily.  # Demand ischemia Minimally elevated in the absence of ischemic EKG changes this is most consistent with demand/supply mismatch and not ACS   This patient's plan of care was discussed and created with Dr. Clayborn Bigness and he is in agreement.  Signed: Tristan Schroeder , PA-C 06/18/2022, 11:19 AM [facility] Cardiology

## 2022-06-19 DIAGNOSIS — I5033 Acute on chronic diastolic (congestive) heart failure: Secondary | ICD-10-CM | POA: Diagnosis not present

## 2022-06-19 LAB — GLUCOSE, CAPILLARY
Glucose-Capillary: 222 mg/dL — ABNORMAL HIGH (ref 70–99)
Glucose-Capillary: 238 mg/dL — ABNORMAL HIGH (ref 70–99)
Glucose-Capillary: 240 mg/dL — ABNORMAL HIGH (ref 70–99)
Glucose-Capillary: 213 mg/dL — ABNORMAL HIGH (ref 70–99)

## 2022-06-19 LAB — BASIC METABOLIC PANEL
Anion gap: 9 (ref 5–15)
BUN: 74 mg/dL — ABNORMAL HIGH (ref 8–23)
CO2: 28 mmol/L (ref 22–32)
Calcium: 8.8 mg/dL — ABNORMAL LOW (ref 8.9–10.3)
Chloride: 105 mmol/L (ref 98–111)
Creatinine, Ser: 3.8 mg/dL — ABNORMAL HIGH (ref 0.61–1.24)
GFR, Estimated: 15 mL/min — ABNORMAL LOW (ref >60)
Glucose, Bld: 207 mg/dL — ABNORMAL HIGH (ref 70–99)
Potassium: 3.5 mmol/L (ref 3.5–5.1)
Sodium: 142 mmol/L (ref 135–145)

## 2022-06-19 MED ORDER — FUROSEMIDE 40 MG PO TABS
80.0000 mg | ORAL_TABLET | Freq: Every day | ORAL | Status: DC
  Administered 2022-06-20: 80 mg via ORAL
  Filled 2022-06-19: qty 2

## 2022-06-19 MED ORDER — FUROSEMIDE 40 MG PO TABS
40.0000 mg | ORAL_TABLET | Freq: Every day | ORAL | Status: DC
  Administered 2022-06-19: 40 mg via ORAL
  Filled 2022-06-19: qty 1

## 2022-06-19 NOTE — Progress Notes (Signed)
James Clarke  MRN: [id number]  DOB/AGE: 09/21/1940 81 y.o.  Primary Care Physician:Feldpausch, Chrissie Noa, MD  Admit date: 06/12/2022  Chief Complaint:  Chief Complaint  Patient presents with   Shortness of Breath    S-Pt presented on  06/12/2022 with  Chief Complaint  Patient presents with   Shortness of Breath  .  Patient is 81 year old Caucasian male with a past medical history diabetes mellitus type 2, CKD stage IV, morbid obesity, hypertension, obstructive sleep apnea, nephrolithiasis, chronic diastolic CHF who came to the ER with chief complaint of shortness of breath. Upon evaluation in the ER patient was found to be in fluid overload and was admitted. Patient is well-known to nephrology practice. Patient follows up with Dr. Holley Raring as an outpatient.  Patient last visit was on April 23, 2022 with a plan to follow-up in a month.  Update: Patient seen sitting at side of bed, alert and oriented Wife at bedside Patient states he was able to ambulate 2 laps around nurses station Reports usual amount of shortness of breath after completing activity Lower extremity greatly improved from admission  Medications  amLODipine  10 mg Oral Daily   aspirin EC  81 mg Oral Daily   calcitRIOL  0.25 mcg Oral Daily   furosemide  40 mg Oral Daily   [START ON 06/20/2022] furosemide  80 mg Oral Daily   heparin  5,000 Units Subcutaneous Q8H   hydrALAZINE  50 mg Oral TID   insulin aspart  0-20 Units Subcutaneous TID WC   insulin aspart  5 Units Subcutaneous TID WC   insulin detemir  40 Units Subcutaneous QHS   isosorbide mononitrate  30 mg Oral Daily   metoprolol succinate  50 mg Oral Daily   pravastatin  40 mg Oral q1800   sodium chloride flush  3 mL Intravenous Q12H   tamsulosin  0.4 mg Oral Daily    NWG:NFAOZ from the symptoms mentioned above,there are no other symptoms referable to all systems reviewed.  Physical Exam: Vital signs in last 24 hours: Temp:  [97.7 F (36.5  C)-98.6 F (37 C)] 98.6 F (37 C) (11/29 1136) Pulse Rate:  [65-75] 73 (11/29 1136) Resp:  [14-24] 18 (11/29 1136) BP: (134-142)/(56-67) 142/67 (11/29 1136) SpO2:  [93 %-98 %] 98 % (11/29 1136) Weight:  [139.4 kg] 139.4 kg (11/29 0500) Weight change: -1.986 kg Last BM Date : 06/17/22  Intake/Output from previous day: 11/28 0701 - 11/29 0700 In: -  Out: 2350 [Urine:2350] Total I/O In: -  Out: 1100 [Urine:1100]   Physical Exam:  General- pt is awake,alert, oriented to time place and person  HEENT-head is atraumatic normocephalic sclera is anicteric  Resp- No acute REsp distress, Decreased breath sounds at bases  CVS- S1S2 regular in rate and rhythm  GIT- BS+, soft, Non tender , Non distended  EXT- 1+ LE Edema,  No Cyanosis    Lab Results:  CBC  Recent Labs    06/18/22 0554  HGB 10.0*     BMET  Recent Labs    06/18/22 0554 06/19/22 0714  NA 141 142  K 4.4 3.5  CL 105 105  CO2 26 28  GLUCOSE 167* 207*  BUN 72* 74*  CREATININE 3.71* 3.80*  CALCIUM 7.4* 8.8*       Most recent Creatinine trend  Lab Results  Component Value Date   CREATININE 3.80 (H) 06/19/2022   CREATININE 3.71 (H) 06/18/2022   CREATININE 3.55 (H) 06/17/2022  MICRO   Recent Results (from the past 240 hour(s))  Resp Panel by RT-PCR (Flu A&B, Covid) Anterior Nasal Swab     Status: None   Collection Time: 06/12/22  2:20 PM   Specimen: Anterior Nasal Swab  Result Value Ref Range Status   SARS Coronavirus 2 by RT PCR NEGATIVE NEGATIVE Final    Comment: (NOTE) SARS-CoV-2 target nucleic acids are NOT DETECTED.  The SARS-CoV-2 RNA is generally detectable in upper respiratory specimens during the acute phase of infection. The lowest concentration of SARS-CoV-2 viral copies this assay can detect is 138 copies/mL. A negative result does not preclude SARS-Cov-2 infection and should not be used as the sole basis for treatment or other patient management decisions. A  negative result may occur with  improper specimen collection/handling, submission of specimen other than nasopharyngeal swab, presence of viral mutation(s) within the areas targeted by this assay, and inadequate number of viral copies(<138 copies/mL). A negative result must be combined with clinical observations, patient history, and epidemiological information. The expected result is Negative.  Fact Sheet for Patients:  EntrepreneurPulse.com.au  Fact Sheet for Healthcare Providers:  IncredibleEmployment.be  This test is no t yet approved or cleared by the [place] FDA and  has been authorized for detection and/or diagnosis of SARS-CoV-2 by FDA under an Emergency Use Authorization (EUA). This EUA will remain  in effect (meaning this test can be used) for the duration of the COVID-19 declaration under Section 564(b)(1) of the Act, 21 U.S.C.section 360bbb-3(b)(1), unless the authorization is terminated  or revoked sooner.       Influenza A by PCR NEGATIVE NEGATIVE Final   Influenza B by PCR NEGATIVE NEGATIVE Final    Comment: (NOTE) The Xpert Xpress SARS-CoV-2/FLU/RSV plus assay is intended as an aid in the diagnosis of influenza from Nasopharyngeal swab specimens and should not be used as a sole basis for treatment. Nasal washings and aspirates are unacceptable for Xpert Xpress SARS-CoV-2/FLU/RSV testing.  Fact Sheet for Patients: EntrepreneurPulse.com.au  Fact Sheet for Healthcare Providers: IncredibleEmployment.be  This test is not yet approved or cleared by the [place] FDA and has been authorized for detection and/or diagnosis of SARS-CoV-2 by FDA under an Emergency Use Authorization (EUA). This EUA will remain in effect (meaning this test can be used) for the duration of the COVID-19 declaration under Section 564(b)(1) of the Act, 21 U.S.C. section 360bbb-3(b)(1), unless the authorization  is terminated or revoked.  Performed at [facility], [address] [place], [place] [address]      Impression:  Patient is 81 year old Caucasian male with a past medical history of diabetes mellitus, proteinuria, CKD stage IV, morbid obesity, hypertension, obstructive sleep apnea, nephrolithiasis, chronic diastolic CHF who is admitted with Acute on chronic diastolic CHF  1)Renal   Acute kidney injury on chronic kidney disease stage IV. Patient has AKI secondary to ATN and diuretics Patient has CKD stage IV secondary to diabetes mellitus.  Renal function remains stable considering IV diuresis.  Urine output remains adequate, 2.3 L in previous 24 hours.  No acute need for dialysis at this time.  Will transition patient to oral diuretics today.  Will increase home regimen, oral furosemide 80 mg in the a.m. and furosemide 40 mg in p.m.  Recommend monitoring 1 additional night inpatient to ensure adequate dosing.  Will continue to monitor patient closely outpatient at discharge.  2) hypertension with chronic kidney disease.  Home regimen includes amlodipine, furosemide, hydralazine and metoprolol.  Patient currently prescribed these  medications along with isosorbide.  Transition to oral furosemide as above.   3)Anemia of chronic kidney disease     Latest Ref Rng & Units 06/18/2022    5:54 AM 06/16/2022    5:15 AM 06/15/2022    4:45 AM  CBC  WBC 4.0 - 10.5 K/uL  9.7  9.5   Hemoglobin 13.0 - 17.0 g/dL 10.0  10.0  9.9   Hematocrit 39.0 - 52.0 %  30.7  30.6   Platelets 150 - 400 K/uL  274  271    Hemoglobin remains within target.   4) Secondary hyperparathyroidism -CKD Mineral-Bone Disorder  Lab Results  Component Value Date   CALCIUM 8.8 (L) 06/19/2022   CAION 1.19 09/11/2020   PHOS 4.3 (H) 04/10/2022   Patient intact PTH - 148 Calcium has improved this admission.  Continue calcitriol.  [place] Yoko Mcgahee 06/19/2022, 12:01 PM

## 2022-06-19 NOTE — Progress Notes (Signed)
Mobility Specialist - Progress Note   06/19/22 1319  Mobility  Activity Ambulated with assistance in hallway;Stood at bedside;Dangled on edge of bed  Level of Assistance Standby assist, set-up cues, supervision of patient - no hands on  Assistive Device Front wheel walker  Distance Ambulated (ft) 160 ft  Activity Response Tolerated well  Mobility Referral Yes  $Mobility charge 1 Mobility   Pt EOB on RA upon arrival. Pt STS and ambulates 1 lap around NS Supervision with no LOB. Pt has slight SOB at the end of ambulation that resolves with rest. Pt left on EOB with needs in reach and family in room.   Gretchen Short  Mobility Specialist  06/19/22 1:21 PM

## 2022-06-19 NOTE — Progress Notes (Signed)
   Heart Failure Nurse Navigator Note  Met with patient and wife today.  Went over daily weights and fluid restriction, stressed the importance of the fluid restrictions and taking his water pill.Marland Kitchen  He states he has problems with being thirsty.  Listed examples of ways to treat thirst without drinking liquids.  Explained to them the ventricle health program and given brochure, they are interested in  enrolling.  Application sent.  Allowed them time to vent frustrations with the hospitalization, feel that they are getting conflicting instructions. They had no further questions.   Pricilla Riffle RN CHFN

## 2022-06-19 NOTE — Progress Notes (Signed)
Physical Therapy Treatment Patient Details Name: James Clarke MRN: [id number] DOB: 29-Mar-1941 Today's Date: 06/19/2022   History of Present Illness presented to ER secondary to progressive SOB; admitted for management of acute/chronic CHF    PT Comments    Patient tolerated session well today. His BLE edema is improving. Patient reports he ambulated "2 laps" this morning with mobility aide, using rolling walker. Patient was able to perform supine to sit with modified independence, sit to stand with supervision without assistive device use, and ambulated 100 feet x 3 without assistive device in hallway (patient required 2 brief standing rests for dyspnea on exertion), wide based gait due to LE edema. SPO2 stable on room air throughout. Patient hopeful for D/C home soon, voices stress regarding medical plan of care. Recommending D/C home with HHPT for additional endurance training. Patient and spouse voice agreement with PT plan at this time.   Recommendations for follow up therapy are one component of a multi-disciplinary discharge planning process, led by the attending physician.  Recommendations may be updated based on patient status, additional functional criteria and insurance authorization.  Follow Up Recommendations  Home health PT     Assistance Recommended at Discharge Set up Supervision/Assistance  Patient can return home with the following A little help with walking and/or transfers;Help with stairs or ramp for entrance   Equipment Recommendations  None recommended by PT (patient has needed equipment at home)    Recommendations for Other Services       Precautions / Restrictions Precautions Precautions: Fall;Other (comment) (obesity) Restrictions Weight Bearing Restrictions: No     Mobility  Bed Mobility Overal bed mobility: Modified Independent             General bed mobility comments: increased time to complete    Transfers Overall transfer level:  Modified independent Equipment used: None Transfers: Sit to/from Stand Sit to Stand: Supervision                Ambulation/Gait Ambulation/Gait assistance: Supervision Gait Distance (Feet): 300 Feet Assistive device: None Gait Pattern/deviations: Step-through pattern, Wide base of support Gait velocity:  (2 brief standing rest breaks)     General Gait Details: ambulation on room air   Stairs             Wheelchair Mobility    Modified Rankin (Stroke Patients Only)       Balance Overall balance assessment: Needs assistance Sitting-balance support: No upper extremity supported, Feet supported Sitting balance-Leahy Scale: Good Sitting balance - Comments: sat EOB with distant supervision   Standing balance support:  (none) Standing balance-Leahy Scale: Fair Standing balance comment: walked without AD, mild postural sway                            Cognition Arousal/Alertness: Awake/alert Behavior During Therapy: WFL for tasks assessed/performed Overall Cognitive Status: Within Functional Limits for tasks assessed                                          Exercises      General Comments General comments (skin integrity, edema, etc.): 1+ BLE pitting edema      Pertinent Vitals/Pain Pain Assessment Pain Assessment: No/denies pain    Home Living  Prior Function            PT Goals (current goals can now be found in the care plan section) Acute Rehab PT Goals Patient Stated Goal: Go home PT Goal Formulation: With patient/family Time For Goal Achievement: 06/28/22 Potential to Achieve Goals: Good Progress towards PT goals: Progressing toward goals    Frequency    Min 2X/week      PT Plan Current plan remains appropriate    Co-evaluation              AM-PAC PT "6 Clicks" Mobility   Outcome Measure  Help needed turning from your back to your side while in a flat bed  without using bedrails?: None Help needed moving from lying on your back to sitting on the side of a flat bed without using bedrails?: None Help needed moving to and from a bed to a chair (including a wheelchair)?: None Help needed standing up from a chair using your arms (e.g., wheelchair or bedside chair)?: None Help needed to walk in hospital room?: None Help needed climbing 3-5 steps with a railing? : A Little 6 Click Score: 23    End of Session   Activity Tolerance: Patient tolerated treatment well Patient left:  (seated at EOB, spouse present) Nurse Communication: Mobility status PT Visit Diagnosis: Muscle weakness (generalized) (M62.81);Difficulty in walking, not elsewhere classified (R26.2)     Time: 3143-8887 PT Time Calculation (min) (ACUTE ONLY): 26 min  Charges:  $Gait Training: 23-37 mins                     Clemetine Marker, PT, DPT, CCS, GCS    Derrell Lolling 06/19/2022, 1:06 PM

## 2022-06-19 NOTE — Plan of Care (Signed)

## 2022-06-19 NOTE — Progress Notes (Signed)
Mobility Specialist - Progress Note   06/19/22 0933  Mobility  Activity Ambulated with assistance in hallway;Stood at bedside;Dangled on edge of bed  Level of Assistance Standby assist, set-up cues, supervision of patient - no hands on  Assistive Device Front wheel walker  Distance Ambulated (ft) 320 ft  Activity Response Tolerated well  $Mobility charge 1 Mobility   Pt EOB on RA upon arrival. Pt STS and ambulates 2 laps around NS SBA. Pt returns to EOB with needs in reach.   Gretchen Short  Mobility Specialist  06/19/22 9:34 AM

## 2022-06-19 NOTE — Progress Notes (Signed)
[place] [facility] [place] NAME: James Clarke    MR#:  [id number]  DATE OF BIRTH:  29-Feb-1941  SUBJECTIVE:  wife at bedside. Patient is currently on room air. Life expressed frustration with IV Lasix and order Lasix switches back to [place] again. Discussed at length overall the reason why this could have been done. Tried my level best to answer to the satisfaction. Denies chest pain. Able to ambulate to the bathroom.    VITALS:  Blood pressure (!) 134/59, pulse 74, temperature 98.1 F (36.7 C), resp. rate 16, height '5\' 10"'$  (1.778 m), weight (!) 139.4 kg, SpO2 97 %.  PHYSICAL EXAMINATION:   GENERAL:  81 y.o.-year-old patient lying in the bed with no acute distress. Morbid obesity LUNGS: decreased breath sounds bilaterally, no wheezing CARDIOVASCULAR: S1, S2 normal. No murmurs,   ABDOMEN: Soft, nontender, nondistended. Bowel sounds present.  EXTREMITIES: 1+edema b/l.    NEUROLOGIC: nonfocal  patient is alert and awake SKIN: No obvious rash, lesion, or ulcer.   LABORATORY PANEL:  CBC Recent Labs  Lab 06/16/22 0515 06/18/22 0554  WBC 9.7  --   HGB 10.0* 10.0*  HCT 30.7*  --   PLT 274  --     Chemistries  Recent Labs  Lab 06/13/22 0025 06/13/22 0240 06/19/22 0714  NA  --    < > 142  K  --    < > 3.5  CL  --    < > 105  CO2  --    < > 28  GLUCOSE  --    < > 207*  BUN  --    < > 74*  CREATININE  --    < > 3.80*  CALCIUM  --    < > 8.8*  MG 2.1  --   --    < > = values in this interval not displayed.    Assessment and Plan James Clarke is a 81 y.o. male with medical history significant for diabetes mellitus with complications of stage IV chronic kidney disease, morbid obesity (BMI 46.49 kg/m2), hypertension, obstructive sleep apnea, history of nephrolithiasis, chronic diastolic dysfunction CHF with last known LVEF of 55 to 60% from 08/23 who presents to the ER via EMS for evaluation of worsening shortness of breath.  Patient  states that he has had shortness of breath for several days but worse 1 day prior to his admission.  He states that he slept in his recliner with his CPAP because he could not breathe laying flat.   Acute on chronic diastolic CHF (congestive heart failure) (HCC) EF 55 to 60%.   Case discussed with nephrology and will  change to  Lasix 80 mg am and 40 mg pm from tomorrow  Fluid restrict.  Continue Toprol.   Overall weight trending down   Acute renal failure superimposed on chronic kidney disease ([place]) AKI on CKD stage IV.   Creatinine as low as 2.90 on 06/12/2022.   Creatinine 3.71 today.   Type 2 diabetes mellitus with renal manifestations (HCC) Chronic kidney disease stage IV.   Continue decreased dose of long-acting insulin with elevation of creatinine to.   Add 5 units of short acting insulin plus sliding scale.   Hypertension Continue amlodipine, metoprolol and hydralazine.   Sleep apnea Secondary to morbid obesity Continue CPAP at bedtime   Anemia in chronic kidney disease Hemoglobin 10.  Ferritin 149   Morbid obesity due to excess calories (HCC) Last  BMI 43.94   Procedures:none Family communication :wife at bedside Consults : nephrology, [facility] cardiology CODE STATUS: full DVT Prophylaxis :heparin Level of care: Progressive Status is: Inpatient Remains inpatient appropriate because: continue to monitor urine output on PO Lasix. Discharge in 1 to 2 days.    TOTAL TIME TAKING CARE OF THIS PATIENT: 35 minutes.  >50% time spent on counselling and coordination of care  Note: This dictation was prepared with Dragon dictation along with smaller phrase technology. Any transcriptional errors that result from this process are unintentional.  Fritzi Mandes M.D    [facility]   CC: Primary care physician; Sofie Hartigan, MD

## 2022-06-19 NOTE — Inpatient Diabetes Management (Signed)
Inpatient Diabetes Program Recommendations  AACE/ADA: New Consensus Statement on Inpatient Glycemic Control (2015)  Target Ranges:  Prepandial:   less than 140 mg/dL      Peak postprandial:   less than 180 mg/dL (1-2 hours)      Critically ill patients:  140 - 180 mg/dL    Latest Reference Range & Units 06/18/22 08:13 06/18/22 11:35 06/18/22 16:39 06/18/22 20:37  Glucose-Capillary 70 - 99 mg/dL 150 (H)  8 units Novolog  269 (H)  16 units Novolog  174 (H)  9 units Novolog  167 (H)    40 units Levemir '@2100'$   (H): Data is abnormally high  Latest Reference Range & Units 06/19/22 07:59 06/19/22 11:58  Glucose-Capillary 70 - 99 mg/dL 222 (H)  12 units Novolog  238 (H)  12 units Novolog   (H): Data is abnormally high    Home DM Meds: Levemir 50 units QHS     Novolog 30 units with breakfast, Novolog 30 units with lunch, Novolog 55 units with supper    Current Orders: Levemir 40 units QHS      Novolog Resistant Correction Scale/ SSI (0-20 units) TID AC      Novolog 5 units TID with meals   MD- Please consider:  1. Increase Levemir slightly to 45 units QHS  2. Increase Novolog Meal Coverage to 8 units TID with meals    --Will follow patient during hospitalization--  Wyn Quaker RN, MSN, [place] Diabetes Coordinator Inpatient Glycemic Control Team Team Pager: [phone] (8a-5p)

## 2022-06-19 NOTE — Progress Notes (Signed)
[facility] Cardiology    SUBJECTIVE: 81 year old male states shortness of breath is somewhat better leg edema somewhat better still having difficulty with renal insufficiency no pain no weakness   Vitals:   06/18/22 2347 06/19/22 0434 06/19/22 0500 06/19/22 0758  BP: 136/61 (!) 141/62  (!) 134/56  Pulse: 71 65  68  Resp: '16 16  14  '$ Temp: 98.6 F (37 C) 97.7 F (36.5 C)  97.9 F (36.6 C)  TempSrc: Oral Oral    SpO2: 95% 95%  96%  Weight:   (!) 139.4 kg   Height:         Intake/Output Summary (Last 24 hours) at 06/19/2022 5277 Last data filed at 06/19/2022 8242 Gross per 24 hour  Intake --  Output 3450 ml  Net -3450 ml      PHYSICAL EXAM  General: Well developed, well nourished, in no acute distress HEENT:  Normocephalic and atramatic Neck:  No JVD.  Lungs: Clear bilaterally to auscultation and percussion. Heart: HRRR . Normal S1 and S2 without gallops or murmurs.  Abdomen: Bowel sounds are positive, abdomen soft and non-tender  Msk:  Back normal, normal gait. Normal strength and tone for age. Extremities: No clubbing, cyanosis or edema.   Neuro: Alert and oriented X 3. Psych:  Good affect, responds appropriately   LABS: Basic Metabolic Panel: Recent Labs    06/18/22 0554 06/19/22 0714  NA 141 142  K 4.4 3.5  CL 105 105  CO2 26 28  GLUCOSE 167* 207*  BUN 72* 74*  CREATININE 3.71* 3.80*  CALCIUM 7.4* 8.8*   Liver Function Tests: No results for input(s): "AST", "ALT", "ALKPHOS", "BILITOT", "PROT", "ALBUMIN" in the last 72 hours. No results for input(s): "LIPASE", "AMYLASE" in the last 72 hours. CBC: Recent Labs    06/18/22 0554  HGB 10.0*   Cardiac Enzymes: No results for input(s): "CKTOTAL", "CKMB", "CKMBINDEX", "TROPONINI" in the last 72 hours. BNP: Invalid input(s): "POCBNP" D-Dimer: No results for input(s): "DDIMER" in the last 72 hours. Hemoglobin A1C: No results for input(s): "HGBA1C" in the last 72 hours. Fasting Lipid Panel: No results for  input(s): "CHOL", "HDL", "LDLCALC", "TRIG", "CHOLHDL", "LDLDIRECT" in the last 72 hours. Thyroid Function Tests: No results for input(s): "TSH", "T4TOTAL", "T3FREE", "THYROIDAB" in the last 72 hours.  Invalid input(s): "FREET3" Anemia Panel: Recent Labs    06/18/22 0554  FERRITIN 149    No results found.   Echo preserved left ventricular function EF around 55 to 60%  TELEMETRY: Normal sinus rhythm rate of 70 nonspecific ST-T wave changes:  ASSESSMENT AND PLAN:  Principal Problem:   Acute on chronic diastolic CHF (congestive heart failure) (HCC) Active Problems:   Type 2 diabetes mellitus with renal manifestations (HCC)   Sleep apnea   Morbid obesity due to excess calories (HCC)   Hypertension   Anemia in chronic kidney disease   Acute renal failure superimposed on chronic kidney disease (HCC)   Chest pain at rest    Plan Acute on chronic diastolic congestive heart failure continue diuretic therapy but renal function has worsened and will defer further diuresis to nephrology  Acute on chronic renal insufficiency with worsening creatinine and GFR further diuresis as per nephrology  Diabetes type 2 uncomplicated continue insulin therapy adjusting dose based on renal function  Obstructive sleep apnea recommend continue CPAP recommend weight loss  Reasonably controlled continue amlodipine metoprolol hydralazine  Obesity recommend modest weight loss exercise portion control  Mild anemia continue current therapy consider supplemental iron possibly  related to chronic disease   Yolonda Kida, MD 06/19/2022 8:21 AM

## 2022-06-20 DIAGNOSIS — I5033 Acute on chronic diastolic (congestive) heart failure: Secondary | ICD-10-CM | POA: Diagnosis not present

## 2022-06-20 LAB — HEMOGLOBIN A1C
Hgb A1c MFr Bld: 6.6 % — ABNORMAL HIGH (ref 4.8–5.6)
Mean Plasma Glucose: 143 mg/dL

## 2022-06-20 LAB — GLUCOSE, CAPILLARY
Glucose-Capillary: 236 mg/dL — ABNORMAL HIGH (ref 70–99)
Glucose-Capillary: 430 mg/dL — ABNORMAL HIGH (ref 70–99)

## 2022-06-20 MED ORDER — FUROSEMIDE 40 MG PO TABS: ORAL_TABLET | ORAL | 2 refills | Status: DC

## 2022-06-20 MED ORDER — INSULIN DETEMIR 100 UNIT/ML ~~LOC~~ SOLN: 50.0000 [IU] | Freq: Every day | SUBCUTANEOUS | 1 refills | Status: DC

## 2022-06-20 MED ORDER — ISOSORBIDE MONONITRATE ER 30 MG PO TB24: 30.0000 mg | ORAL_TABLET | Freq: Every day | ORAL | 2 refills | Status: AC

## 2022-06-20 MED ORDER — INSULIN ASPART 100 UNIT/ML IJ SOLN
20.0000 [IU] | Freq: Once | INTRAMUSCULAR | Status: AC
  Administered 2022-06-20: 20 [IU] via SUBCUTANEOUS

## 2022-06-20 MED ORDER — NITROGLYCERIN 0.4 MG SL SUBL: 0.4000 mg | SUBLINGUAL_TABLET | SUBLINGUAL | 1 refills | Status: AC | PRN

## 2022-06-20 NOTE — Discharge Summary (Signed)
Physician Discharge Summary   Patient: James Clarke MRN: [id number] DOB: [birth year]  Admit date:     06/12/2022  Discharge date: 06/20/22  Discharge Physician: Fritzi Mandes   PCP: Sofie Hartigan, MD   Recommendations at discharge:    F/u cardiology PA Estill Bamberg in 1-2 weeks Fu Dr Holley Raring next week  Discharge Diagnoses: Principal Problem:   Acute on chronic diastolic CHF (congestive heart failure) ([place]) Active Problems:   Acute renal failure superimposed on chronic kidney disease ([place])   Type 2 diabetes mellitus with renal manifestations ([place])   Hypertension   Sleep apnea   Morbid obesity due to excess calories (HCC)   Anemia in chronic kidney disease   Chest pain at rest   Hospital Course:  James Clarke is a 81 y.o. male with medical history significant for diabetes mellitus with complications of stage IV chronic kidney disease, morbid obesity (BMI 46.49 kg/m2), hypertension, obstructive sleep apnea, history of nephrolithiasis, chronic diastolic dysfunction CHF with last known LVEF of 55 to 60% from 08/23 who presents to the ER via EMS for evaluation of worsening shortness of breath.  Patient states that he has had shortness of breath for several days but worse 1 day prior to his admission.  He states that he slept in his recliner with his CPAP because he could not breathe laying flat.    Acute on chronic diastolic CHF (congestive heart failure) (HCC) EF 55 to 60%.   Case discussed with nephrology and will  change to  Lasix 80 mg am and 40 mg pm at discharge  Fluid restriction Continue Toprol.   Overall weight trending down and good UOP   Acute renal failure superimposed on chronic kidney disease ([place]) AKI on CKD stage IV.   Creatinine as low as 2.90 on 06/12/2022.   Creatinine 3.80.   Type 2 diabetes mellitus with renal manifestations (HCC) Chronic kidney disease stage IV.   pt will resume his Insulin regime at home and adjust dosing according to his  sugars  Hypertension Continue amlodipine, metoprolol and hydralazine.   Sleep apnea Secondary to morbid obesity Continue CPAP at bedtime   Anemia in chronic kidney disease Hemoglobin 10.  Ferritin 149   Morbid obesity due to excess calories (HCC) Last BMI 43.94   Ok to discharge home per Dr Candiss Norse. Pt and wife agreeable   Procedures:none Family communication :wife at bedside Consults : nephrology, [facility] cardiology CODE STATUS: full DVT Prophylaxis :heparin     Disposition: HH Diet recommendation:  Discharge Diet Orders (From admission, onward)     Start     Ordered   06/20/22 0000  Diet - low sodium heart healthy        06/20/22 1237           Cardiac and Carb modified diet DISCHARGE MEDICATION: Allergies as of 06/20/2022       Reactions   Codeine Nausea And Vomiting   Doxycycline    Erythromycin Rash        Medication List     TAKE these medications    acetaminophen 500 MG tablet Commonly known as: TYLENOL Take by mouth.   albuterol 108 (90 Base) MCG/ACT inhaler Commonly known as: VENTOLIN HFA Inhale 2 puffs into the lungs every 6 (six) hours as needed for wheezing or shortness of breath.   amLODipine 10 MG tablet Commonly known as: NORVASC Take 1 tablet (10 mg total) by mouth daily.   aspirin EC 81 MG tablet Take by mouth.  calcitRIOL 0.25 MCG capsule Commonly known as: ROCALTROL Take 0.25 mcg by mouth daily.   cetirizine 10 MG chewable tablet Commonly known as: ZYRTEC Chew by mouth.   fluticasone 50 MCG/ACT nasal spray Commonly known as: FLONASE as needed.   furosemide 40 MG tablet Commonly known as: LASIX Take 80 mg q am and 40 mg qpm Start taking on: June 21, 2022 What changed:  how much to take how to take this when to take this additional instructions   Global Inject Ease Insulin Syr 31G X 5/16" 0.5 ML Misc Generic drug: Insulin Syringe-Needle U-100 Inject into the skin.   hydrALAZINE 50 MG tablet Commonly known  as: APRESOLINE Take 50 mg by mouth 3 (three) times daily.   insulin aspart 100 UNIT/ML injection Commonly known as: novoLOG Inject 30 units before breakfast and lunch. Inject 55 units before supper.   insulin detemir 100 UNIT/ML injection Commonly known as: LEVEMIR Inject 0.5 mLs (50 Units total) into the skin at bedtime.   isosorbide mononitrate 30 MG 24 hr tablet Commonly known as: IMDUR Take 1 tablet (30 mg total) by mouth daily. Start taking on: June 21, 2022   lovastatin 40 MG tablet Commonly known as: MEVACOR Take 40 mg by mouth daily with supper.   metoprolol succinate 50 MG 24 hr tablet Commonly known as: TOPROL-XL Take 50 mg by mouth daily.   nitroGLYCERIN 0.4 MG SL tablet Commonly known as: NITROSTAT Place 1 tablet (0.4 mg total) under the tongue every 5 (five) minutes as needed for chest pain.   OneTouch Delica Plus [id number] Misc USE 1 LANCET 4 TIMES DAILY   OneTouch Ultra test strip Generic drug: glucose blood TEST BLOOD SUGAR 4 TIMES DAILY   potassium citrate 10 MEQ (1080 MG) SR tablet Commonly known as: UROCIT-K Take 10 mEq by mouth in the morning and at bedtime.   tamsulosin 0.4 MG Caps capsule Commonly known as: FLOMAX TAKE (1) CAPSULE BY MOUTH EVERY DAY   traMADol 50 MG tablet Commonly known as: ULTRAM Take 50 mg by mouth every 12 (twelve) hours as needed.        Follow-up Information     Scheryl Marten, PA-C. Go in 1 week(s).   Specialty: Cardiology Why: Appointment on Tuesday, 07/03/2022 at 11:00am. This appointment will be at the [facility] location.  [address], [place], [place] [address] Contact information: [place] [address] [phone]         Anthonette Legato, MD. Schedule an appointment as soon as possible for a visit in 1 week(s).   Specialty: Nephrology Why: next week pt will need BMP to be done as outpt Contact information: [place] [address] [phone]          Sofie Hartigan, MD. Schedule an appointment as soon as possible for a visit in 1 week(s).   Specialty: Family Medicine Contact information: [place] [address] [phone]                Discharge Exam: Filed Weights   06/18/22 0500 06/19/22 0500 06/20/22 0438  Weight: (!) 138.9 kg (!) 139.4 kg (!) 139.3 kg     Condition at discharge: fair  The results of significant diagnostics from this hospitalization (including imaging, microbiology, ancillary and laboratory) are listed below for reference.   Imaging Studies: DG Chest 1 View  Result Date: 06/14/2022 CLINICAL DATA:  Shortness of breath EXAM: CHEST  1 VIEW COMPARISON:  06/12/2022 and prior studies FINDINGS: The cardiomediastinal silhouette is unchanged. Mild interstitial opacities are again noted as well as mild bibasilar opacities/atelectasis. There is no evidence of pneumothorax or acute bony abnormality. There has been little interval change since prior study. IMPRESSION: Unchanged appearance of the chest with mild interstitial opacities and mild bibasilar opacities/atelectasis. Electronically Signed   By: Margarette Canada M.D.   On: 06/14/2022 08:46   CT CHEST WO CONTRAST  Result Date: 06/12/2022 CLINICAL DATA:  Chest pain and shortness of breath. EXAM: CT CHEST WITHOUT CONTRAST TECHNIQUE: Multidetector CT imaging of the chest was performed following the standard protocol without IV contrast. RADIATION DOSE REDUCTION: This exam was performed according to the departmental dose-optimization program which includes automated exposure control, adjustment of the mA and/or kV according to patient size and/or use of iterative reconstruction technique. COMPARISON:  CT scan from 2015. FINDINGS: Cardiovascular: The heart is normal in size for age. Small pericardial effusion noted. The aorta is normal in caliber. Scattered atherosclerotic calcifications. Extensive three-vessel  coronary artery calcifications. Mediastinum/Nodes: Scattered borderline mediastinal and hilar lymph nodes, likely reactive. The esophagus is grossly normal. Lungs/Pleura: Small bilateral pleural effusions with overlying atelectasis. Changes of interstitial lung disease with peripheral subpleural reticulations, thickening of the interlobular septi, faint nodularity and extensive lower lobe predominant parenchymal calcifications. This could be due to chronic inflammation, previous infection, aspiration and less likely pulmonary alveolar microlithiasis. Patchy nodular ground-glass opacities in the right lower lobe may suggest a more acute inflammatory process and aspiration or bronchopneumonia are possibilities. Upper Abdomen: No significant upper abdominal findings. No hepatic or adrenal gland lesions. No upper abdominal adenopathy. Scattered vascular calcifications. Musculoskeletal: No chest wall mass, supraclavicular or axillary adenopathy. The bony thorax is intact. IMPRESSION: 1. Changes of interstitial lung disease as detailed above. 2. Stable lower lobe predominant parenchymal lung calcifications as above. 3. Patchy nodular ground-glass opacities in the right lower lobe may suggest a more acute inflammatory process and aspiration or bronchopneumonia are possibilities. 4. Small bilateral pleural effusions with overlying atelectasis. 5. Small pericardial effusion. 6. Extensive three-vessel coronary artery calcifications. Electronically Signed   By: Marijo Sanes M.D.   On: 06/12/2022 16:42   DG Chest 2 View  Result Date: 06/12/2022 CLINICAL DATA:  Chest pain and shortness of breath since last night, slept in recliner with CPAP all night EXAM: CHEST - 2 VIEW COMPARISON:  09/19/2009 FINDINGS: Enlargement of cardiac silhouette. Mediastinal contours and pulmonary vascularity normal. Interstitial infiltrates at the mid to lower lungs question edema. Small LEFT pleural effusion. No upper lobe infiltrate or  pneumothorax. Osseous structures demineralized. IMPRESSION: Enlargement of cardiac silhouette with bibasilar infiltrates question pulmonary edema. Small associated LEFT pleural effusion. Electronically Signed   By: Lavonia Dana M.D.   On: 06/12/2022 13:21   CT RENAL STONE STUDY  Result Date: 05/23/2022 CLINICAL DATA:  Flank pain. EXAM: CT ABDOMEN AND PELVIS WITHOUT CONTRAST TECHNIQUE: Multidetector CT imaging of the abdomen and pelvis was performed following the standard protocol without IV contrast. RADIATION DOSE REDUCTION: This exam was performed according to the departmental dose-optimization program which includes automated exposure control, adjustment of the mA and/or kV according to patient size and/or use of iterative reconstruction technique. COMPARISON:  CT abdomen and pelvis 02/26/2022 FINDINGS: Lower chest: There is some chronic interstitial opacities in both lung bases similar to prior. There is a small pericardial effusion, similar to prior. There is also trace left pleural effusion. Hepatobiliary: No focal liver abnormality is seen. Status post cholecystectomy.  No biliary dilatation. Pancreas: Unremarkable. No pancreatic ductal dilatation or surrounding inflammatory changes. Spleen: Normal in size without focal abnormality. Adrenals/Urinary Tract: There are 3 punctate calculi in the superior pole the right kidney measuring 3 mm each. There is a cyst in the superior pole the right kidney measuring 4 cm. There is no hydronephrosis or perinephric fluid collection. No ureteral or bladder calculi are seen. The bladder and adrenal glands are within normal limits. Stomach/Bowel: Stomach is within normal limits. No evidence of bowel wall thickening, distention, or inflammatory changes. There is diffuse colonic diverticulosis. The appendix is not visualized. Vascular/Lymphatic: Aortic atherosclerosis. No enlarged abdominal or pelvic lymph nodes. Reproductive: Prostate is unremarkable. Other: There is a  small fat containing umbilical hernia. No ascites. There is some subcutaneous density just beneath the skin surface in the upper left anterior abdominal wall which is unchanged, possibly scarring. Musculoskeletal: Degenerative changes affect the spine. IMPRESSION: 1. Nonobstructing right renal calculi. No hydronephrosis or ureteral calculus. 2. Colonic diverticulosis without evidence for diverticulitis. 3. Stable small pericardial effusion. 4. Trace left pleural effusion. 5. Right Bosniak II benign renal cyst measuring 4 cm. No follow-up imaging is recommended. JACR 2018 Feb; 264-273, Management of the Incidental Renal Mass on CT, [employer] 2021; 814-848, Bosniak Classification of Cystic Renal Masses, Version 2019. Aortic Atherosclerosis (ICD10-I70.0). Electronically Signed   By: Ronney Asters M.D.   On: 05/23/2022 15:40    Microbiology: Results for orders placed or performed during the hospital encounter of 06/12/22  Resp Panel by RT-PCR (Flu A&B, Covid) Anterior Nasal Swab     Status: None   Collection Time: 06/12/22  2:20 PM   Specimen: Anterior Nasal Swab  Result Value Ref Range Status   SARS Coronavirus 2 by RT PCR NEGATIVE NEGATIVE Final    Comment: (NOTE) SARS-CoV-2 target nucleic acids are NOT DETECTED.  The SARS-CoV-2 RNA is generally detectable in upper respiratory specimens during the acute phase of infection. The lowest concentration of SARS-CoV-2 viral copies this assay can detect is 138 copies/mL. A negative result does not preclude SARS-Cov-2 infection and should not be used as the sole basis for treatment or other patient management decisions. A negative result may occur with  improper specimen collection/handling, submission of specimen other than nasopharyngeal swab, presence of viral mutation(s) within the areas targeted by this assay, and inadequate number of viral copies(<138 copies/mL). A negative result must be combined with clinical observations, patient history, and  epidemiological information. The expected result is Negative.  Fact Sheet for Patients:  EntrepreneurPulse.com.au  Fact Sheet for Healthcare Providers:  IncredibleEmployment.be  This test is no t yet approved or cleared by the [place] FDA and  has been authorized for detection and/or diagnosis of SARS-CoV-2 by FDA under an Emergency Use Authorization (EUA). This EUA will remain  in effect (meaning this test can be used) for the duration of the COVID-19 declaration under Section 564(b)(1) of the Act, 21 U.S.C.section 360bbb-3(b)(1), unless the authorization is terminated  or revoked sooner.       Influenza A by PCR NEGATIVE NEGATIVE Final   Influenza B by PCR NEGATIVE NEGATIVE Final    Comment: (NOTE) The Xpert Xpress SARS-CoV-2/FLU/RSV plus assay is intended as an aid in the diagnosis of influenza from Nasopharyngeal swab specimens and should not be used as a sole basis for treatment. Nasal washings and aspirates are unacceptable for Xpert Xpress SARS-CoV-2/FLU/RSV testing.  Fact Sheet for Patients: EntrepreneurPulse.com.au  Fact Sheet for Healthcare Providers: IncredibleEmployment.be  This test is not yet  approved or cleared by the [place] and has been authorized for detection and/or diagnosis of SARS-CoV-2 by FDA under an Emergency Use Authorization (EUA). This EUA will remain in effect (meaning this test can be used) for the duration of the COVID-19 declaration under Section 564(b)(1) of the Act, 21 U.S.C. section 360bbb-3(b)(1), unless the authorization is terminated or revoked.  Performed at [facility], [address] [place], [place] [address]     Labs: CBC: Recent Labs  Lab 06/14/22 0447 06/15/22 0445 06/16/22 0515 06/18/22 0554  WBC 10.9* 9.5 9.7  --   HGB 10.2* 9.9* 10.0* 10.0*  HCT 32.1* 30.6* 30.7*  --   MCV 89.7 89.0 88.5  --   PLT 269 271 274  --     Basic Metabolic Panel: Recent Labs  Lab 06/15/22 0445 06/16/22 0515 06/17/22 0441 06/18/22 0554 06/19/22 0714  NA 139 141 141 141 142  K 3.4* 3.4* 3.4* 4.4 3.5  CL 102 105 104 105 105  CO2 '26 27 26 26 28  '$ GLUCOSE 208* 197* 207* 167* 207*  BUN 70* 73* 74* 72* 74*  CREATININE 3.55* 3.58* 3.55* 3.71* 3.80*  CALCIUM 8.3* 8.3* 8.4* 7.4* 8.8*   Liver Function Tests: No results for input(s): "AST", "ALT", "ALKPHOS", "BILITOT", "PROT", "ALBUMIN" in the last 168 hours. CBG: Recent Labs  Lab 06/19/22 1158 06/19/22 1556 06/19/22 2106 06/20/22 0756 06/20/22 1220  GLUCAP 238* 240* 213* 236* 430*    Discharge time spent: greater than 30 minutes.  Signed: Fritzi Mandes, MD [facility] 06/20/2022

## 2022-06-20 NOTE — Progress Notes (Signed)
Occupational Therapy Treatment Patient Details Name: James Clarke MRN: [id number] DOB: 1940-08-15 Today's Date: 06/20/2022   History of present illness presented to ER secondary to progressive SOB; admitted for management of acute/chronic CHF   OT comments  Upon entering the room, pt seated in recliner chair with RN present in the room finishing medication. Pt is agreeable to OT intervention. RN requesting assistance getting standing weight on machine. Pt ambulates with close supervision without use of AD and stands for weight and then returning to room completing ambulating of 200'. Pt seated on EOB and dons pull over shirt with set up A. Pt needing min A to thread pants over B feet and he stood with close supervision to pull pants over B hips. Pt does endorse having LH reacher and OT discussed how he can utilize at home to increase independence with LB dressing. Pt returning to sit in recliner chair at end of session. Wife present in room during session for observation. OT also educating pt on energy conservation techniques for home related to self care. All needs within reach. Pt continues to benefit from OT intervention.    Recommendations for follow up therapy are one component of a multi-disciplinary discharge planning process, led by the attending physician.  Recommendations may be updated based on patient status, additional functional criteria and insurance authorization.    Follow Up Recommendations  Home health OT     Assistance Recommended at Discharge Intermittent Supervision/Assistance  Patient can return home with the following  A little help with walking and/or transfers;A little help with bathing/dressing/bathroom;Assistance with cooking/housework;Assist for transportation;Help with stairs or ramp for entrance   Equipment Recommendations  None recommended by OT       Precautions / Restrictions Precautions Precautions: Fall       Mobility Bed Mobility Overal bed  mobility: Modified Independent                  Transfers Overall transfer level: Needs assistance Equipment used: None Transfers: Sit to/from Stand Sit to Stand: Supervision                 Balance Overall balance assessment: Needs assistance Sitting-balance support: No upper extremity supported, Feet supported Sitting balance-Leahy Scale: Good Sitting balance - Comments: sat EOB for dressing tasks   Standing balance support: During functional activity, No upper extremity supported Standing balance-Leahy Scale: Fair                             ADL either performed or assessed with clinical judgement   ADL Overall ADL's : Needs assistance/impaired                 Upper Body Dressing : Set up;Sitting   Lower Body Dressing: Minimal assistance;Sit to/from stand               Functional mobility during ADLs: Supervision/safety      Extremity/Trunk Assessment Upper Extremity Assessment Upper Extremity Assessment: Overall WFL for tasks assessed   Lower Extremity Assessment Lower Extremity Assessment: Overall WFL for tasks assessed        Vision Patient Visual Report: No change from baseline            Cognition Arousal/Alertness: Awake/alert Behavior During Therapy: WFL for tasks assessed/performed Overall Cognitive Status: Within Functional Limits for tasks assessed  Pertinent Vitals/ Pain       Pain Assessment Pain Assessment: No/denies pain         Frequency  Min 2X/week        Progress Toward Goals  OT Goals(current goals can now be found in the care plan section)  Progress towards OT goals: Progressing toward goals  Acute Rehab OT Goals Patient Stated Goal: go home OT Goal Formulation: With patient/family Time For Goal Achievement: 06/28/22 Potential to Achieve Goals: Good  Plan Discharge plan remains appropriate;Frequency remains  appropriate       AM-PAC OT "6 Clicks" Daily Activity     Outcome Measure   Help from another person eating meals?: None Help from another person taking care of personal grooming?: None Help from another person toileting, which includes using toliet, bedpan, or urinal?: A Little Help from another person bathing (including washing, rinsing, drying)?: A Little Help from another person to put on and taking off regular upper body clothing?: None Help from another person to put on and taking off regular lower body clothing?: A Little 6 Click Score: 21    End of Session Equipment Utilized During Treatment: Rolling walker (2 wheels)  OT Visit Diagnosis: Other abnormalities of gait and mobility (R26.89)   Activity Tolerance Patient tolerated treatment well   Patient Left in chair;with call bell/phone within reach;with family/visitor present   Nurse Communication Mobility status        Time: 5697-9480 OT Time Calculation (min): 24 min  Charges: OT General Charges $OT Visit: 1 Visit OT Treatments $Self Care/Home Management : 8-22 mins $Therapeutic Activity: 8-22 mins  Darleen Crocker, MS, OTR/L , CBIS ascom [phone]  06/20/22, 11:42 AM

## 2022-06-20 NOTE — Progress Notes (Signed)
James Clarke  MRN: [id number]  DOB/AGE: [birth year] 82 y.o.  Primary Care Physician:Feldpausch, Chrissie Noa, MD  Admit date: 06/12/2022  Chief Complaint:  Chief Complaint  Patient presents with   Shortness of Breath    S-Pt presented on  06/12/2022 with  Chief Complaint  Patient presents with   Shortness of Breath  .  Patient is 81 year old Caucasian male with a past medical history diabetes mellitus type 2, CKD stage IV, morbid obesity, hypertension, obstructive sleep apnea, nephrolithiasis, chronic diastolic CHF who came to the ER with chief complaint of shortness of breath. Upon evaluation in the ER patient was found to be in fluid overload and was admitted. Patient is well-known to nephrology practice. Patient follows up with Dr. Holley Raring as an outpatient.  Patient last visit was on April 23, 2022 with a plan to follow-up in a month.  Update: Patient seen sitting up in chair, wife at bedside Patient has been weaned to room air, tolerating well Has not ambulated in hallway today Patient and wife appear anxious about diuretic therapy and myself and attending have answered all questions to the best of our ability.  Medications  amLODipine  10 mg Oral Daily   aspirin EC  81 mg Oral Daily   calcitRIOL  0.25 mcg Oral Daily   furosemide  40 mg Oral Daily   furosemide  80 mg Oral Daily   heparin  5,000 Units Subcutaneous Q8H   hydrALAZINE  50 mg Oral TID   insulin aspart  0-20 Units Subcutaneous TID WC   insulin aspart  5 Units Subcutaneous TID WC   insulin detemir  40 Units Subcutaneous QHS   isosorbide mononitrate  30 mg Oral Daily   metoprolol succinate  50 mg Oral Daily   pravastatin  40 mg Oral q1800   sodium chloride flush  3 mL Intravenous Q12H   tamsulosin  0.4 mg Oral Daily    WUJ:WJXBJ from the symptoms mentioned above,there are no other symptoms referable to all systems reviewed.  Physical Exam: Vital signs in last 24 hours: Temp:  [97.3 F (36.3 C)-98.6 F  (37 C)] 97.3 F (36.3 C) (11/30 1202) Pulse Rate:  [66-78] 72 (11/30 1202) Resp:  [16-20] 20 (11/30 1202) BP: (130-137)/(53-65) 133/58 (11/30 1202) SpO2:  [96 %-100 %] 100 % (11/30 1202) Weight:  [139.3 kg] 139.3 kg (11/30 0438) Weight change: -0.1 kg Last BM Date : 06/20/22  Intake/Output from previous day: 11/29 0701 - 11/30 0700 In: 360 [P.O.:360] Out: 2650 [Urine:2650] Total I/O In: 4782 [P.O.:1170] Out: 800 [Urine:800]   Physical Exam:  General- pt is awake,alert, oriented to time place and person  HEENT-head is atraumatic normocephalic sclera is anicteric  Resp- No acute REsp distress, Decreased breath sounds at bases  CVS- S1S2 regular in rate and rhythm  GIT- BS+, soft, Non tender , Non distended  EXT-trace LE Edema,  No Cyanosis    Lab Results:  CBC  Recent Labs    06/18/22 0554  HGB 10.0*     BMET  Recent Labs    06/18/22 0554 06/19/22 0714  NA 141 142  K 4.4 3.5  CL 105 105  CO2 26 28  GLUCOSE 167* 207*  BUN 72* 74*  CREATININE 3.71* 3.80*  CALCIUM 7.4* 8.8*       Most recent Creatinine trend  Lab Results  Component Value Date   CREATININE 3.80 (H) 06/19/2022   CREATININE 3.71 (H) 06/18/2022   CREATININE 3.55 (H) 06/17/2022  MICRO   Recent Results (from the past 240 hour(s))  Resp Panel by RT-PCR (Flu A&B, Covid) Anterior Nasal Swab     Status: None   Collection Time: 06/12/22  2:20 PM   Specimen: Anterior Nasal Swab  Result Value Ref Range Status   SARS Coronavirus 2 by RT PCR NEGATIVE NEGATIVE Final    Comment: (NOTE) SARS-CoV-2 target nucleic acids are NOT DETECTED.  The SARS-CoV-2 RNA is generally detectable in upper respiratory specimens during the acute phase of infection. The lowest concentration of SARS-CoV-2 viral copies this assay can detect is 138 copies/mL. A negative result does not preclude SARS-Cov-2 infection and should not be used as the sole basis for treatment or other patient management  decisions. A negative result may occur with  improper specimen collection/handling, submission of specimen other than nasopharyngeal swab, presence of viral mutation(s) within the areas targeted by this assay, and inadequate number of viral copies(<138 copies/mL). A negative result must be combined with clinical observations, patient history, and epidemiological information. The expected result is Negative.  Fact Sheet for Patients:  EntrepreneurPulse.com.au  Fact Sheet for Healthcare Providers:  IncredibleEmployment.be  This test is no t yet approved or cleared by the [place] FDA and  has been authorized for detection and/or diagnosis of SARS-CoV-2 by FDA under an Emergency Use Authorization (EUA). This EUA will remain  in effect (meaning this test can be used) for the duration of the COVID-19 declaration under Section 564(b)(1) of the Act, 21 U.S.C.section 360bbb-3(b)(1), unless the authorization is terminated  or revoked sooner.       Influenza A by PCR NEGATIVE NEGATIVE Final   Influenza B by PCR NEGATIVE NEGATIVE Final    Comment: (NOTE) The Xpert Xpress SARS-CoV-2/FLU/RSV plus assay is intended as an aid in the diagnosis of influenza from Nasopharyngeal swab specimens and should not be used as a sole basis for treatment. Nasal washings and aspirates are unacceptable for Xpert Xpress SARS-CoV-2/FLU/RSV testing.  Fact Sheet for Patients: EntrepreneurPulse.com.au  Fact Sheet for Healthcare Providers: IncredibleEmployment.be  This test is not yet approved or cleared by the [place] FDA and has been authorized for detection and/or diagnosis of SARS-CoV-2 by FDA under an Emergency Use Authorization (EUA). This EUA will remain in effect (meaning this test can be used) for the duration of the COVID-19 declaration under Section 564(b)(1) of the Act, 21 U.S.C. section 360bbb-3(b)(1), unless the  authorization is terminated or revoked.  Performed at [facility], [address], [place], [place] [address]      Impression:  Patient is 81 year old Caucasian male with a past medical history of diabetes mellitus, proteinuria, CKD stage IV, morbid obesity, hypertension, obstructive sleep apnea, nephrolithiasis, chronic diastolic CHF who is admitted with Acute on chronic diastolic CHF  1)Renal   Acute kidney injury on chronic kidney disease stage IV. Patient has AKI secondary to ATN and diuretics Patient has CKD stage IV secondary to diabetes mellitus.  Renal function remains elevated from baseline however patient has received aggressive IV diuresis during this admission.  Patient is cleared to discharge from renal standpoint with close outpatient follow-up.  Recommend patient discharging on oral furosemide 80 mg in a.m. and 40 mg in p.m.  2) hypertension with chronic kidney disease.  Home regimen includes amlodipine, furosemide, hydralazine and metoprolol.  Patient currently prescribed these medications along with isosorbide.  Will continue diuresis as above and follow-up outpatient.   3)Anemia of chronic kidney disease     Latest Ref Rng & Units  06/18/2022    5:54 AM 06/16/2022    5:15 AM 06/15/2022    4:45 AM  CBC  WBC 4.0 - 10.5 K/uL  9.7  9.5   Hemoglobin 13.0 - 17.0 g/dL 10.0  10.0  9.9   Hematocrit 39.0 - 52.0 %  30.7  30.6   Platelets 150 - 400 K/uL  274  271    Hemoglobin stable   4) Secondary hyperparathyroidism -CKD Mineral-Bone Disorder  Lab Results  Component Value Date   CALCIUM 8.8 (L) 06/19/2022   CAION 1.19 09/11/2020   PHOS 4.3 (H) 04/10/2022   Patient intact PTH - 148 Continue calcitriol.  James Clarke 06/20/2022, 12:33 PM

## 2022-06-20 NOTE — TOC Transition Note (Signed)
Transition of Care [facility]) - CM/SW Discharge Note   Patient Details  Name: James Clarke MRN: [id number] Date of Birth: 12-12-1940  Transition of Care [facility]) CM/SW Contact:  Quin Hoop, LCSW Phone Number: 06/20/2022, 2:52 PM   Clinical Narrative:    Pt discharging home with [facility] services being provided by [facility].  James Clarke is aware.  James Clarke, [place]    Final next level of care: [place] Barriers to Discharge: No Barriers Identified   Patient Goals and CMS Choice Patient states their goals for this hospitalization and ongoing recovery are:: To return home CMS Medicare.gov Compare Post Acute Care list provided to:: Patient Represenative (must comment) Choice offered to / list presented to : NA  Discharge Placement                       Discharge Plan and Services                DME Arranged: N/A         HH Arranged: PT, OT       Representative spoke with at [place]: left VM for James with [facility], waiting on confirmation  Social Determinants of Health (SDOH) Interventions     Readmission Risk Interventions    02/27/2022    4:22 PM  Readmission Risk Prevention Plan  Transportation Screening Complete  HRI or Home Care Consult Complete  Palliative Care Screening Not Applicable  Medication Review (RN Care Manager) Complete

## 2022-06-20 NOTE — Progress Notes (Signed)
Discussed discharge with patient and wife including medications and follow visits.  Encouraged patient to continue to monitor blood pressure and weight at home.    Patient Blood sugar was 430 at lunch prior to discharge.   MD made aware.  Will give 20 units before discharge.  Encouraged patient to recheck his blood sugar at home and monitor it closely until he can get back on his normal regiment.

## 2022-06-21 DIAGNOSIS — I5030 Unspecified diastolic (congestive) heart failure: Secondary | ICD-10-CM | POA: Diagnosis not present

## 2022-06-21 DIAGNOSIS — N185 Chronic kidney disease, stage 5: Secondary | ICD-10-CM | POA: Diagnosis not present

## 2022-06-21 DIAGNOSIS — E1142 Type 2 diabetes mellitus with diabetic polyneuropathy: Secondary | ICD-10-CM | POA: Diagnosis not present

## 2022-06-23 NOTE — Progress Notes (Unsigned)
   Patient ID: James Clarke, male    DOB: 1940/08/17, 81 y.o.   MRN: [id number]  HPI  Mr Safley is a 81 y/o male with a history of  Echo report from 02/26/22 showed an EF of 55-60% along with mild LVH, LAE and trivial MR.   LHC done 12/31/21 and showed:   Mid LAD lesion is 60% stenosed.   Prox RCA lesion is 20% stenosed.   Dist RCA lesion is 20% stenosed.   Ost Cx to Prox Cx lesion is 20% stenosed.   The left ventricular systolic function is normal.   LV end diastolic pressure is moderately elevated.   The left ventricular ejection fraction is greater than 65% by visual estimate.  Patient has significant elevated end-diastolic pressure  Admitted 06/12/22 due to worsening shortness of breath. Patient states that he has had shortness of breath for several days but worse 1 day prior to his admission. He states that he slept in his recliner with his CPAP because he could not breathe laying flat. Cardiology & nephrology consults obtained.  Diuretic changed. PT/OT evaluations done. Discharged after 8 days.   He presents today for his initial visit with a chief complaint of   Review of Systems    Physical Exam    Assessment & Plan:  1: Chronic heart failure with preserved ejection fraction with structural changes (LVH/LAE)- - NYHA class - on GDMT of - saw cardiology Nehemiah Massed) 01/10/22 - BNP 06/12/22 was 415.8   2: HTN- - BP - saw PCP (Feldpausch) 05/08/22 - BMP 06/19/22 reviewed and showed sodium 142, potassium 3.5, creatinine 3.8 & GFR 15  3: DM with CKD- - A1c 06/19/22 was 6.6% - saw nephrology Holley Raring) 04/23/22 - saw endocrinology (Solum) 02/19/22  4: Sleep apnea-

## 2022-06-24 ENCOUNTER — Encounter: Payer: Self-pay | Admitting: Family

## 2022-06-24 ENCOUNTER — Other Ambulatory Visit
Admission: RE | Admit: 2022-06-24 | Discharge: 2022-06-24 | Disposition: A | Discharge status: Home / Self Care | Payer: PPO | Source: Ambulatory Visit | Attending: Family | Admitting: Family

## 2022-06-24 ENCOUNTER — Ambulatory Visit (HOSPITAL_BASED_OUTPATIENT_CLINIC_OR_DEPARTMENT_OTHER): Payer: PPO | Admitting: Family

## 2022-06-24 VITALS — BP 145/69 | HR 75 | Resp 18 | Wt 318.0 lb

## 2022-06-24 DIAGNOSIS — Z794 Long term (current) use of insulin: Secondary | ICD-10-CM | POA: Diagnosis not present

## 2022-06-24 DIAGNOSIS — Z6841 Body Mass Index (BMI) 40.0 and over, adult: Secondary | ICD-10-CM | POA: Diagnosis not present

## 2022-06-24 DIAGNOSIS — E1122 Type 2 diabetes mellitus with diabetic chronic kidney disease: Secondary | ICD-10-CM | POA: Diagnosis not present

## 2022-06-24 DIAGNOSIS — R531 Weakness: Secondary | ICD-10-CM | POA: Diagnosis not present

## 2022-06-24 DIAGNOSIS — I10 Essential (primary) hypertension: Secondary | ICD-10-CM

## 2022-06-24 DIAGNOSIS — I5033 Acute on chronic diastolic (congestive) heart failure: Secondary | ICD-10-CM | POA: Diagnosis not present

## 2022-06-24 DIAGNOSIS — N184 Chronic kidney disease, stage 4 (severe): Secondary | ICD-10-CM

## 2022-06-24 DIAGNOSIS — I5032 Chronic diastolic (congestive) heart failure: Secondary | ICD-10-CM | POA: Insufficient documentation

## 2022-06-24 LAB — BASIC METABOLIC PANEL
Anion gap: 8 (ref 5–15)
BUN: 83 mg/dL — ABNORMAL HIGH (ref 8–23)
CO2: 29 mmol/L (ref 22–32)
Calcium: 8.8 mg/dL — ABNORMAL LOW (ref 8.9–10.3)
Chloride: 103 mmol/L (ref 98–111)
Creatinine, Ser: 3.47 mg/dL — ABNORMAL HIGH (ref 0.61–1.24)
GFR, Estimated: 17 mL/min — ABNORMAL LOW (ref >60)
Glucose, Bld: 180 mg/dL — ABNORMAL HIGH (ref 70–99)
Potassium: 3.5 mmol/L (ref 3.5–5.1)
Sodium: 140 mmol/L (ref 135–145)

## 2022-06-24 NOTE — Patient Instructions (Signed)
Continue weighing daily and call for an overnight weight gain of 3 pounds or more or a weekly weight gain of more than 5 pounds.   If you have voicemail, please make sure your mailbox is cleaned out so that we may leave a message and please make sure to listen to any voicemails.     

## 2022-06-25 DIAGNOSIS — E1121 Type 2 diabetes mellitus with diabetic nephropathy: Secondary | ICD-10-CM | POA: Diagnosis not present

## 2022-06-25 DIAGNOSIS — E1169 Type 2 diabetes mellitus with other specified complication: Secondary | ICD-10-CM | POA: Diagnosis not present

## 2022-06-25 DIAGNOSIS — I10 Essential (primary) hypertension: Secondary | ICD-10-CM | POA: Diagnosis not present

## 2022-06-25 DIAGNOSIS — E669 Obesity, unspecified: Secondary | ICD-10-CM | POA: Diagnosis not present

## 2022-06-25 DIAGNOSIS — D631 Anemia in chronic kidney disease: Secondary | ICD-10-CM | POA: Diagnosis not present

## 2022-06-25 DIAGNOSIS — E1122 Type 2 diabetes mellitus with diabetic chronic kidney disease: Secondary | ICD-10-CM | POA: Diagnosis not present

## 2022-06-25 DIAGNOSIS — Z9289 Personal history of other medical treatment: Secondary | ICD-10-CM | POA: Diagnosis not present

## 2022-06-25 DIAGNOSIS — R809 Proteinuria, unspecified: Secondary | ICD-10-CM | POA: Diagnosis not present

## 2022-06-25 DIAGNOSIS — E1142 Type 2 diabetes mellitus with diabetic polyneuropathy: Secondary | ICD-10-CM | POA: Diagnosis not present

## 2022-06-25 DIAGNOSIS — Z794 Long term (current) use of insulin: Secondary | ICD-10-CM | POA: Diagnosis not present

## 2022-06-25 DIAGNOSIS — N2581 Secondary hyperparathyroidism of renal origin: Secondary | ICD-10-CM | POA: Diagnosis not present

## 2022-06-25 DIAGNOSIS — N184 Chronic kidney disease, stage 4 (severe): Secondary | ICD-10-CM | POA: Diagnosis not present

## 2022-06-26 DIAGNOSIS — Z9989 Dependence on other enabling machines and devices: Secondary | ICD-10-CM | POA: Diagnosis not present

## 2022-06-26 DIAGNOSIS — E1122 Type 2 diabetes mellitus with diabetic chronic kidney disease: Secondary | ICD-10-CM | POA: Diagnosis not present

## 2022-06-26 DIAGNOSIS — I5033 Acute on chronic diastolic (congestive) heart failure: Secondary | ICD-10-CM | POA: Diagnosis not present

## 2022-06-26 DIAGNOSIS — I130 Hypertensive heart and chronic kidney disease with heart failure and stage 1 through stage 4 chronic kidney disease, or unspecified chronic kidney disease: Secondary | ICD-10-CM | POA: Diagnosis not present

## 2022-06-26 DIAGNOSIS — E1140 Type 2 diabetes mellitus with diabetic neuropathy, unspecified: Secondary | ICD-10-CM | POA: Diagnosis not present

## 2022-06-26 DIAGNOSIS — R079 Chest pain, unspecified: Secondary | ICD-10-CM | POA: Diagnosis not present

## 2022-06-26 DIAGNOSIS — N184 Chronic kidney disease, stage 4 (severe): Secondary | ICD-10-CM | POA: Diagnosis not present

## 2022-06-26 DIAGNOSIS — Z794 Long term (current) use of insulin: Secondary | ICD-10-CM | POA: Diagnosis not present

## 2022-06-26 DIAGNOSIS — G4733 Obstructive sleep apnea (adult) (pediatric): Secondary | ICD-10-CM | POA: Diagnosis not present

## 2022-07-03 DIAGNOSIS — G4733 Obstructive sleep apnea (adult) (pediatric): Secondary | ICD-10-CM | POA: Diagnosis not present

## 2022-07-03 DIAGNOSIS — I2510 Atherosclerotic heart disease of native coronary artery without angina pectoris: Secondary | ICD-10-CM | POA: Diagnosis not present

## 2022-07-03 DIAGNOSIS — I700 Atherosclerosis of aorta: Secondary | ICD-10-CM | POA: Diagnosis not present

## 2022-07-03 DIAGNOSIS — I5032 Chronic diastolic (congestive) heart failure: Secondary | ICD-10-CM | POA: Diagnosis not present

## 2022-07-03 DIAGNOSIS — R0602 Shortness of breath: Secondary | ICD-10-CM | POA: Diagnosis not present

## 2022-07-03 DIAGNOSIS — E7800 Pure hypercholesterolemia, unspecified: Secondary | ICD-10-CM | POA: Diagnosis not present

## 2022-07-09 DIAGNOSIS — I5032 Chronic diastolic (congestive) heart failure: Secondary | ICD-10-CM | POA: Diagnosis not present

## 2022-07-10 DIAGNOSIS — N184 Chronic kidney disease, stage 4 (severe): Secondary | ICD-10-CM | POA: Diagnosis not present

## 2022-07-10 DIAGNOSIS — E1121 Type 2 diabetes mellitus with diabetic nephropathy: Secondary | ICD-10-CM | POA: Diagnosis not present

## 2022-07-18 DIAGNOSIS — N184 Chronic kidney disease, stage 4 (severe): Secondary | ICD-10-CM | POA: Diagnosis not present

## 2022-07-18 DIAGNOSIS — Z794 Long term (current) use of insulin: Secondary | ICD-10-CM | POA: Diagnosis not present

## 2022-07-18 DIAGNOSIS — I5021 Acute systolic (congestive) heart failure: Secondary | ICD-10-CM | POA: Diagnosis not present

## 2022-07-18 DIAGNOSIS — E1122 Type 2 diabetes mellitus with diabetic chronic kidney disease: Secondary | ICD-10-CM | POA: Diagnosis not present

## 2022-07-20 DIAGNOSIS — G4733 Obstructive sleep apnea (adult) (pediatric): Secondary | ICD-10-CM | POA: Diagnosis not present

## 2022-07-22 DIAGNOSIS — G4733 Obstructive sleep apnea (adult) (pediatric): Secondary | ICD-10-CM | POA: Diagnosis not present

## 2022-07-23 DIAGNOSIS — G4733 Obstructive sleep apnea (adult) (pediatric): Secondary | ICD-10-CM | POA: Diagnosis not present

## 2022-07-23 DIAGNOSIS — E1122 Type 2 diabetes mellitus with diabetic chronic kidney disease: Secondary | ICD-10-CM | POA: Diagnosis not present

## 2022-07-23 DIAGNOSIS — E1140 Type 2 diabetes mellitus with diabetic neuropathy, unspecified: Secondary | ICD-10-CM | POA: Diagnosis not present

## 2022-07-23 DIAGNOSIS — I130 Hypertensive heart and chronic kidney disease with heart failure and stage 1 through stage 4 chronic kidney disease, or unspecified chronic kidney disease: Secondary | ICD-10-CM | POA: Diagnosis not present

## 2022-07-23 DIAGNOSIS — Z9989 Dependence on other enabling machines and devices: Secondary | ICD-10-CM | POA: Diagnosis not present

## 2022-07-23 DIAGNOSIS — Z794 Long term (current) use of insulin: Secondary | ICD-10-CM | POA: Diagnosis not present

## 2022-07-23 DIAGNOSIS — I5033 Acute on chronic diastolic (congestive) heart failure: Secondary | ICD-10-CM | POA: Diagnosis not present

## 2022-07-23 DIAGNOSIS — R079 Chest pain, unspecified: Secondary | ICD-10-CM | POA: Diagnosis not present

## 2022-07-23 DIAGNOSIS — N184 Chronic kidney disease, stage 4 (severe): Secondary | ICD-10-CM | POA: Diagnosis not present

## 2022-07-29 DIAGNOSIS — D631 Anemia in chronic kidney disease: Secondary | ICD-10-CM | POA: Diagnosis not present

## 2022-07-29 DIAGNOSIS — N2581 Secondary hyperparathyroidism of renal origin: Secondary | ICD-10-CM | POA: Diagnosis not present

## 2022-07-29 DIAGNOSIS — N184 Chronic kidney disease, stage 4 (severe): Secondary | ICD-10-CM | POA: Diagnosis not present

## 2022-07-29 DIAGNOSIS — I10 Essential (primary) hypertension: Secondary | ICD-10-CM | POA: Diagnosis not present

## 2022-07-29 DIAGNOSIS — R809 Proteinuria, unspecified: Secondary | ICD-10-CM | POA: Diagnosis not present

## 2022-07-29 DIAGNOSIS — N390 Urinary tract infection, site not specified: Secondary | ICD-10-CM | POA: Diagnosis not present

## 2022-07-29 DIAGNOSIS — E1122 Type 2 diabetes mellitus with diabetic chronic kidney disease: Secondary | ICD-10-CM | POA: Diagnosis not present

## 2022-08-03 NOTE — Progress Notes (Unsigned)
Patient ID: James Clarke, male    DOB: 09/10/1940, 82 y.o.   MRN: [id number]  HPI  Mr Abernethy is a 82 y/o male with a history of asthma, DM, hyperlipidemia, HTN, CKD, bell's palsy, sleep apnea and chronic heart failure.   Echo report from 02/26/22 showed an EF of 55-60% along with mild LVH, LAE and trivial MR.   LHC done 12/31/21 and showed:   Mid LAD lesion is 60% stenosed.   Prox RCA lesion is 20% stenosed.   Dist RCA lesion is 20% stenosed.   Ost Cx to Prox Cx lesion is 20% stenosed.   The left ventricular systolic function is normal.   LV end diastolic pressure is moderately elevated.   The left ventricular ejection fraction is greater than 65% by visual estimate.  Patient has significant elevated end-diastolic pressure  Admitted 06/12/22 due to worsening shortness of breath. Patient states that he has had shortness of breath for several days but worse 1 day prior to his admission. He states that he slept in his recliner with his CPAP because he could not breathe laying flat. Cardiology & nephrology consults obtained.  Diuretic changed. PT/OT evaluations done. Discharged after 8 days.   He presents today for a follow-up visit with a chief complaint of moderate shortness of breath with minimal exertion. Describes this as chronic in nature. Has associated fatigue, pedal edema (improving) & morning dizziness along with this. Denies any difficulty sleeping, abdominal distention, palpitations, chest pain, cough or weight gain.   Notices that his morning BP's have been running low for several weeks. Says that he eats breakfast, takes his morning meds, gets in a shower and upon finishing, he feels more SOB and fatigued. Denies taking a very hot shower. Rarely gets these symptoms later in the day.   Past Medical History:  Diagnosis Date   Asthma    Bell's palsy    SEVERAL TIMES   Bell's palsy    Cancer (HCC)    SKIN   CHF (congestive heart failure) (HCC)    Diabetes mellitus without  complication ([place])    Dyspnea    DOE   History of kidney stones    Hyperlipemia    Hypertension    Kidney stone    Neuropathy    Obesity    Sleep apnea    C PAP   Past Surgical History:  Procedure Laterality Date   APPENDECTOMY     CATARACT EXTRACTION W/PHACO Left 05/20/2017   Procedure: CATARACT EXTRACTION PHACO AND INTRAOCULAR LENS PLACEMENT ([place]);  Surgeon: Birder Robson, MD;  Location: [facility];  Service: Ophthalmology;  Laterality: Left;  [place] 00:32.0 AP% 15.5 CDE 4.97 Fluid Pack Lot # [id number]   CATARACT EXTRACTION W/PHACO Right 06/10/2017   Procedure: CATARACT EXTRACTION PHACO AND INTRAOCULAR LENS PLACEMENT ([facility]);  Surgeon: Birder Robson, MD;  Location: [facility];  Service: Ophthalmology;  Laterality: Right;  [place]  00:51 AP% 15.4 CDE 7.95 Fluid pack lot # [id number]   CHOLECYSTECTOMY     CYSTOSCOPY W/ RETROGRADES Right 08/13/2019   Procedure: CYSTOSCOPY WITH RETROGRADE PYELOGRAM;  Surgeon: Billey Co, MD;  Location: [facility];  Service: Urology;  Laterality: Right;   CYSTOSCOPY WITH STENT PLACEMENT Right 07/26/2019   Procedure: CYSTOSCOPY WITH STENT PLACEMENT;  Surgeon: Lucas Mallow, MD;  Location: [facility];  Service: Urology;  Laterality: Right;   CYSTOSCOPY/URETEROSCOPY/HOLMIUM LASER/STENT PLACEMENT Right 08/13/2019   Procedure: CYSTOSCOPY/URETEROSCOPY/HOLMIUM LASER/STENT EXCHANGE;  Surgeon: Billey Co, MD;  Location: [facility];  Service: Urology;  Laterality: Right;   CYSTOSCOPY/URETEROSCOPY/HOLMIUM LASER/STENT PLACEMENT Left 02/27/2022   Procedure: CYSTOSCOPY/URETEROSCOPY/HOLMIUM LASER/STENT PLACEMENT;  Surgeon: Abbie Sons, MD;  Location: [facility];  Service: Urology;  Laterality: Left;   FRACTURE SURGERY     WRIST   HAND SURGERY     HERNIA REPAIR     LEFT HEART CATH AND CORONARY ANGIOGRAPHY N/A 12/31/2021   Procedure: LEFT HEART CATH AND CORONARY ANGIOGRAPHY;  Surgeon: Corey Skains, MD;  Location: [place] [facility];  Service: Cardiovascular;   Laterality: N/A;   SHOULDER ARTHROSCOPY WITH SUBACROMIAL DECOMPRESSION AND OPEN ROTATOR C Right 09/11/2020   Procedure: Right shoulder arthroscopic rotator cuff repair vs Regeneten patch application, subacromial decompression, and biceps tenodesis - Reche Dixon to Assist;  Surgeon: Leim Fabry, MD;  Location: [facility];  Service: Orthopedics;  Laterality: Right;   TEMPORARY DIALYSIS CATHETER N/A 03/05/2022   Procedure: TEMPORARY DIALYSIS CATHETER;  Surgeon: Katha Cabal, MD;  Location: [place] [facility];  Service: Cardiovascular;  Laterality: N/A;   TONSILLECTOMY     URETEROSCOPY WITH HOLMIUM LASER LITHOTRIPSY Left 02/27/2022   Procedure: URETEROSCOPY WITH HOLMIUM LASER LITHOTRIPSY;  Surgeon: Abbie Sons, MD;  Location: [facility];  Service: Urology;  Laterality: Left;   Family History  Problem Relation Age of Onset   Emphysema Mother    COPD Mother    Heart disease Mother    Brain cancer Father    Social History   Tobacco Use   Smoking status: Never    Passive exposure: Never   Smokeless tobacco: Never  Substance Use Topics   Alcohol use: No   Allergies  Allergen Reactions   Codeine Nausea And Vomiting   Doxycycline    Erythromycin Rash   Prior to Admission medications   Medication Sig Start Date End Date Taking? Authorizing Provider  acetaminophen (TYLENOL) 500 MG tablet Take 1,000 mg by mouth every 6 (six) hours as needed (shoulder pain).   Yes [provider]  albuterol (PROVENTIL HFA;VENTOLIN HFA) 108 (90 Base) MCG/ACT inhaler Inhale 2 puffs into the lungs every 6 (six) hours as needed for wheezing or shortness of breath.   Yes [provider]  amLODipine (NORVASC) 10 MG tablet Take 1 tablet (10 mg total) by mouth daily. Patient taking differently: Take 5 mg by mouth daily. 03/12/22  Yes Lorella Nimrod, MD  aspirin EC 81 MG tablet Take by mouth.   Yes [provider]  calcitRIOL (ROCALTROL) 0.25 MCG capsule Take 0.25 mcg by mouth daily.  07/13/19  Yes [provider]  cetirizine (ZYRTEC) 10 MG chewable tablet Chew 10 mg by mouth daily as needed for allergies.   Yes [provider]  fluticasone (FLONASE) 50 MCG/ACT nasal spray as needed. 03/17/17  Yes [provider]  furosemide (LASIX) 40 MG tablet Take 80 mg q am and 40 mg qpm 06/21/22  Yes Fritzi Mandes, MD  GLOBAL INJECT EASE INSULIN SYR 31G X 5/16" 0.5 ML MISC Inject into the skin. 08/16/21  Yes [provider]  glucose blood (ONETOUCH ULTRA) test strip TEST BLOOD SUGAR 4 TIMES DAILY 10/26/18  Yes [provider]  hydrALAZINE (APRESOLINE) 50 MG tablet Take 50 mg by mouth 3 (three) times daily.   Yes [provider]  insulin aspart (NOVOLOG) 100 UNIT/ML injection Inject 30 units before breakfast and lunch. Inject 55 units before supper. 08/15/21  Yes [provider]  insulin detemir (LEVEMIR) 100 UNIT/ML injection Inject 0.5 mLs (50 Units total) into the skin  at bedtime. 06/20/22  Yes Fritzi Mandes, MD  isosorbide mononitrate (IMDUR) 30 MG 24 hr tablet Take 1 tablet (30 mg total) by mouth daily. 06/21/22  Yes Fritzi Mandes, MD  Lancets [facility] DELICA PLUS [id number]) [place] USE 1 LANCET 4 TIMES DAILY 05/04/19  Yes [provider]  lovastatin (MEVACOR) 40 MG tablet Take 40 mg by mouth daily with supper. 04/28/17  Yes [provider]  metoprolol succinate (TOPROL-XL) 50 MG 24 hr tablet Take 50 mg by mouth daily. 09/01/21  Yes [provider]  nitroGLYCERIN (NITROSTAT) 0.4 MG SL tablet Place 1 tablet (0.4 mg total) under the tongue every 5 (five) minutes as needed for chest pain. 06/20/22  Yes Fritzi Mandes, MD  potassium citrate (UROCIT-K) 10 MEQ (1080 MG) SR tablet Take 10 mEq by mouth in the morning and at bedtime.   Yes [provider]  tamsulosin (FLOMAX) 0.4 MG CAPS capsule TAKE (1) CAPSULE BY MOUTH EVERY DAY 06/03/22  Yes Stoioff, Ronda Fairly, MD  traMADol (ULTRAM) 50 MG tablet Take 50 mg by mouth  every 12 (twelve) hours as needed. 04/05/21  Yes [provider]    Review of Systems  Constitutional:  Positive for fatigue. Negative for appetite change.  HENT:  Negative for congestion, nosebleeds and sore throat.   Eyes: Negative.   Respiratory:  Positive for shortness of breath (easily). Negative for cough and chest tightness.   Cardiovascular:  Positive for leg swelling (improving). Negative for chest pain and palpitations.  Gastrointestinal:  Negative for abdominal distention and abdominal pain.  Endocrine: Negative.   Genitourinary: Negative.   Musculoskeletal:  Negative for back pain and neck pain.  Skin: Negative.   Allergic/Immunologic: Negative.   Neurological:  Positive for dizziness. Negative for light-headedness and headaches.  Hematological:  Negative for adenopathy. Does not bruise/bleed easily.  Psychiatric/Behavioral:  Negative for dysphoric mood and sleep disturbance (wearing CPAP). The patient is not nervous/anxious.    Vitals:   08/05/22 1249  BP: (!) 143/60  Pulse: 77  Resp: 18  SpO2: 97%  Weight: (!) 315 lb (142.9 kg)   Wt Readings from Last 3 Encounters:  08/05/22 (!) 315 lb (142.9 kg)  06/24/22 (!) 318 lb (144.2 kg)  06/20/22 (!) 307 lb 1.6 oz (139.3 kg)   Lab Results  Component Value Date   CREATININE 3.47 (H) 06/24/2022   CREATININE 3.80 (H) 06/19/2022   CREATININE 3.71 (H) 06/18/2022   Physical Exam Vitals and nursing note reviewed. Exam conducted with a chaperone present (wife).  Constitutional:      Appearance: Normal appearance.  HENT:     Head: Normocephalic and atraumatic.  Cardiovascular:     Rate and Rhythm: Normal rate and regular rhythm.  Pulmonary:     Effort: Pulmonary effort is normal. No respiratory distress.     Breath sounds: No wheezing or rales.  Abdominal:     General: There is no distension.     Palpations: Abdomen is soft.     Tenderness: There is no abdominal tenderness.  Musculoskeletal:        General:  No tenderness.     Cervical back: Normal range of motion and neck supple.     Right lower leg: No tenderness. Edema (2+ pitting) present.     Left lower leg: No tenderness. Edema (1+ pitting) present.  Skin:    General: Skin is warm and dry.  Neurological:     General: No focal deficit present.     Mental Status: He is  alert and oriented to person, place, and time.  Psychiatric:        Mood and Affect: Mood normal.        Behavior: Behavior normal.   Assessment & Plan:  1: Chronic heart failure with preserved ejection fraction with structural changes (LVH/LAE)- - NYHA class III - euvolemic today - weighing daily & home weight ranges from 308-309 pounds; reminded to call for an overnight weight gain of > 2 pounds or a weekly weight gain of >5 pounds - weight down 3 pounds from last visit here 6 weeks ago - renal function limits GDMT therapy - not adding salt and wife reads food labels for sodium content and understands to keep daily intake to '2000mg'$  - drinking 36 ounces of water, 1 glass of milk and a diet sprite daily - saw cardiology Albertine Patricia) 07/03/22; returns 08/15/22 - wearing compression socks daily with removal at bedtime - BNP 06/12/22 was 415.8  - has received his flu & covid vaccines for this season - PharmD reconciled meds w/ patient and his wife  2: HTN- - BP 143/60 - having low BP readings at home in the mornings 85/45, 101/59 - decrease amlodipine to '5mg'$  daily - saw PCP (Feldpausch) 06/24/22 - BMP 06/24/22 reviewed and showed sodium 140, potassium 3.5, creatinine 3.47 & GFR 17  3: DM with CKD- - A1c 06/19/22 was 6.6% - saw nephrology Holley Raring) 06/25/22; returns later this week - saw endocrinology (Solum) 06/25/22   Medication list reviewed.   Return in 3-4 weeks, sooner if needed.

## 2022-08-05 ENCOUNTER — Encounter: Payer: Self-pay | Admitting: Family

## 2022-08-05 ENCOUNTER — Ambulatory Visit: Payer: PPO | Attending: Family | Admitting: Family

## 2022-08-05 ENCOUNTER — Encounter: Payer: Self-pay | Admitting: Pharmacy Technician

## 2022-08-05 VITALS — BP 143/60 | HR 77 | Resp 18 | Wt 315.0 lb

## 2022-08-05 DIAGNOSIS — Z79899 Other long term (current) drug therapy: Secondary | ICD-10-CM | POA: Diagnosis not present

## 2022-08-05 DIAGNOSIS — Z794 Long term (current) use of insulin: Secondary | ICD-10-CM | POA: Diagnosis not present

## 2022-08-05 DIAGNOSIS — J45909 Unspecified asthma, uncomplicated: Secondary | ICD-10-CM | POA: Diagnosis not present

## 2022-08-05 DIAGNOSIS — I5032 Chronic diastolic (congestive) heart failure: Secondary | ICD-10-CM

## 2022-08-05 DIAGNOSIS — E785 Hyperlipidemia, unspecified: Secondary | ICD-10-CM | POA: Insufficient documentation

## 2022-08-05 DIAGNOSIS — I10 Essential (primary) hypertension: Secondary | ICD-10-CM

## 2022-08-05 DIAGNOSIS — Z8669 Personal history of other diseases of the nervous system and sense organs: Secondary | ICD-10-CM | POA: Diagnosis not present

## 2022-08-05 DIAGNOSIS — G4730 Sleep apnea, unspecified: Secondary | ICD-10-CM | POA: Diagnosis not present

## 2022-08-05 DIAGNOSIS — I130 Hypertensive heart and chronic kidney disease with heart failure and stage 1 through stage 4 chronic kidney disease, or unspecified chronic kidney disease: Secondary | ICD-10-CM | POA: Diagnosis not present

## 2022-08-05 DIAGNOSIS — N184 Chronic kidney disease, stage 4 (severe): Secondary | ICD-10-CM

## 2022-08-05 DIAGNOSIS — E1122 Type 2 diabetes mellitus with diabetic chronic kidney disease: Secondary | ICD-10-CM | POA: Insufficient documentation

## 2022-08-05 DIAGNOSIS — N189 Chronic kidney disease, unspecified: Secondary | ICD-10-CM | POA: Diagnosis not present

## 2022-08-05 NOTE — Patient Instructions (Addendum)
Continue weighing daily and call for an overnight weight gain of 3 pounds or more or a weekly weight gain of more than 5 pounds.   If you have voicemail, please make sure your mailbox is cleaned out so that we may leave a message and please make sure to listen to any voicemails.    Decrease amlodipine to 1/2 tablet ('5mg'$ ) every day. Continue to check your blood pressures at home.

## 2022-08-05 NOTE — Progress Notes (Signed)
[place] - PHARMACIST COUNSELING NOTE  Guideline-Directed Medical Therapy/Evidence Based Medicine  ACE/ARB/ARNI:  None Beta Blocker: Metoprolol succinate 50 mg daily Aldosterone Antagonist:  None Diuretic: Furosemide 80 mg every morning and 40 mg every evening SGLT2i:  None  Adherence Assessment  Do you ever forget to take your medication? '[]'$ Yes '[x]'$ No  Do you ever skip doses due to side effects? '[]'$ Yes '[x]'$ No  Do you have trouble affording your medicines? '[]'$ Yes '[x]'$ No  Are you ever unable to pick up your medication due to transportation difficulties? '[]'$ Yes '[x]'$ No  Do you ever stop taking your medications because you don't believe they are helping? '[]'$ Yes '[x]'$ No  Do you check your weight daily? '[x]'$ Yes '[]'$ No   Adherence strategy: Pill box and wife helps, as well  Barriers to obtaining medications: None  Vital signs: HR 77, BP 143/60, weight (pounds) 315 ECHO: Date 02/2022, EF 55-60%     Latest Ref Rng & Units 06/24/2022   11:45 AM 06/19/2022    7:14 AM 06/18/2022    5:54 AM  BMP  Glucose 70 - 99 mg/dL 180  207  167   BUN 8 - 23 mg/dL 83  74  72   Creatinine 0.61 - 1.24 mg/dL 3.47  3.80  3.71   Sodium 135 - 145 mmol/L 140  142  141   Potassium 3.5 - 5.1 mmol/L 3.5  3.5  4.4   Chloride 98 - 111 mmol/L 103  105  105   CO2 22 - 32 mmol/L '29  28  26   '$ Calcium 8.9 - 10.3 mg/dL 8.8  8.8  7.4     Past Medical History:  Diagnosis Date   Asthma    Bell's palsy    SEVERAL TIMES   Bell's palsy    Cancer (HCC)    SKIN   CHF (congestive heart failure) (HCC)    Diabetes mellitus without complication (HCC)    Dyspnea    DOE   History of kidney stones    Hyperlipemia    Hypertension    Kidney stone    Neuropathy    Obesity    Sleep apnea    C PAP    ASSESSMENT 82 year old male with PMH HTN, T2DM, CKD3 (BL Scr at least 3.0; eGFR 15), h/o obstructive uropathy, HLD who presents to the HF clinic for follow-up. Most recent ECHO in  02/2022 shows EF 55-60%. Regarding GDMT, patient takes Toprol XL 50 mg daily. Patient also takes furosemide 80 mg qAM and 40 mg qPM, along with potassium citrate 10 mEq BID. Patient is not a candidate for SGLT2i due to h/o obstructive uropathy as well as eGFR 15, and patient is not a candidate for MRA due to eGFR 15. Patient endorses that recently he has been hypotensive in the morning. The daily timeline of the hypotension is : eats breakfast >> takes medicine >> takes shower >> hypotensive. Thus, hypotension is occurring after taking medication. Patient assures that water temperature during shower is not hot. He has not been needing to take Hydral 50 mg TID PRN recently due to morning hypotension.  Recent ED Visit (past 6 months): Date - 06/12/2022, CC - Dyspnea Date - 02/25/2022, CC - Abdominal pain / AKI on CKD  PLAN CHF/HTN Recommend decreasing amlodipine dose from 10 mg daily to 5 mg daily due to morning hypotension occurring after medication administration Continue Toprol XL 50 mg daily, Furosemide 80 mg qAM and 40 mg qPM (as well as  potassium citrate 10 mEq twice daily) Continue Imdur 30 mg daily and Hydral 50 TID PRN SBP >140  T2DM 05/2022 A1c 6.6% Continue Levemir 50 units daily, and Novolog 30 units at breakfast, 30 units at lunch, and 45 units at dinner  HLD 04/2022 LDL 75 Continue lovastatin 40 mg daily at night   Time spent: 20 minutes  Will M. Ouida Sills, PharmD PGY-1 Pharmacy Resident 08/05/2022 7:57 PM  Current Outpatient Medications:    acetaminophen (TYLENOL) 500 MG tablet, Take 1,000 mg by mouth every 6 (six) hours as needed (shoulder pain)., Disp: , Rfl:    albuterol (PROVENTIL HFA;VENTOLIN HFA) 108 (90 Base) MCG/ACT inhaler, Inhale 2 puffs into the lungs every 6 (six) hours as needed for wheezing or shortness of breath., Disp: , Rfl:    amLODipine (NORVASC) 10 MG tablet, Take 1 tablet (10 mg total) by mouth daily. (Patient taking differently: Take 5 mg by mouth  daily.), Disp: 30 tablet, Rfl: 1   aspirin EC 81 MG tablet, Take by mouth., Disp: , Rfl:    calcitRIOL (ROCALTROL) 0.25 MCG capsule, Take 0.25 mcg by mouth daily., Disp: , Rfl:    cetirizine (ZYRTEC) 10 MG chewable tablet, Chew 10 mg by mouth daily as needed for allergies., Disp: , Rfl:    fluticasone (FLONASE) 50 MCG/ACT nasal spray, as needed., Disp: , Rfl:    furosemide (LASIX) 40 MG tablet, Take 80 mg q am and 40 mg qpm, Disp: 60 tablet, Rfl: 2   GLOBAL INJECT EASE INSULIN SYR 31G X 5/16" 0.5 ML MISC, Inject into the skin., Disp: , Rfl:    glucose blood (ONETOUCH ULTRA) test strip, TEST BLOOD SUGAR 4 TIMES DAILY, Disp: , Rfl:    hydrALAZINE (APRESOLINE) 50 MG tablet, Take 50 mg by mouth 3 (three) times daily., Disp: , Rfl:    insulin aspart (NOVOLOG) 100 UNIT/ML injection, Inject 30 units before breakfast and lunch. Inject 55 units before supper., Disp: , Rfl:    insulin detemir (LEVEMIR) 100 UNIT/ML injection, Inject 0.5 mLs (50 Units total) into the skin at bedtime., Disp: 10 mL, Rfl: 1   isosorbide mononitrate (IMDUR) 30 MG 24 hr tablet, Take 1 tablet (30 mg total) by mouth daily., Disp: 30 tablet, Rfl: 2   Lancets (ONETOUCH DELICA PLUS [id number]) MISC, USE 1 LANCET 4 TIMES DAILY, Disp: , Rfl:    lovastatin (MEVACOR) 40 MG tablet, Take 40 mg by mouth daily with supper., Disp: , Rfl:    metoprolol succinate (TOPROL-XL) 50 MG 24 hr tablet, Take 50 mg by mouth daily., Disp: , Rfl:    nitroGLYCERIN (NITROSTAT) 0.4 MG SL tablet, Place 1 tablet (0.4 mg total) under the tongue every 5 (five) minutes as needed for chest pain., Disp: 20 tablet, Rfl: 1   potassium citrate (UROCIT-K) 10 MEQ (1080 MG) SR tablet, Take 10 mEq by mouth in the morning and at bedtime., Disp: , Rfl:    tamsulosin (FLOMAX) 0.4 MG CAPS capsule, TAKE (1) CAPSULE BY MOUTH EVERY DAY, Disp: 30 capsule, Rfl: 1   traMADol (ULTRAM) 50 MG tablet, Take 50 mg by mouth every 12 (twelve) hours as needed., Disp: , Rfl:   DRUGS TO CAUTION  IN HEART FAILURE  Drug or Class Mechanism  Analgesics NSAIDs COX-2 inhibitors Glucocorticoids  Sodium and water retention, increased systemic vascular resistance, decreased response to diuretics   Diabetes Medications Metformin Thiazolidinediones Rosiglitazone (Avandia) Pioglitazone (Actos) DPP4 Inhibitors Saxagliptin (Onglyza) Sitagliptin (Januvia)   Lactic acidosis Possible calcium channel blockade   Unknown  Antiarrhythmics Class I  Flecainide Disopyramide Class III Sotalol Other Dronedarone  Negative inotrope, proarrhythmic   Proarrhythmic, beta blockade  Negative inotrope  Antihypertensives Alpha Blockers Doxazosin Calcium Channel Blockers Diltiazem Verapamil Nifedipine Central Alpha Adrenergics Moxonidine Peripheral Vasodilators Minoxidil  Increases renin and aldosterone  Negative inotrope    Possible sympathetic withdrawal  Unknown  Anti-infective Itraconazole Amphotericin B  Negative inotrope Unknown  Hematologic Anagrelide Cilostazol   Possible inhibition of PD IV Inhibition of PD III causing arrhythmias  Neurologic/Psychiatric Stimulants Anti-Seizure Drugs Carbamazepine Pregabalin Antidepressants Tricyclics Citalopram Parkinsons Bromocriptine Pergolide Pramipexole Antipsychotics Clozapine Antimigraine Ergotamine Methysergide Appetite suppressants Bipolar Lithium  Peripheral alpha and beta agonist activity  Negative inotrope and chronotrope Calcium channel blockade  Negative inotrope, proarrhythmic Dose-dependent QT prolongation  Excessive serotonin activity/valvular damage Excessive serotonin activity/valvular damage Unknown  IgE mediated hypersensitivy, calcium channel blockade  Excessive serotonin activity/valvular damage Excessive serotonin activity/valvular damage Valvular damage  Direct myofibrillar degeneration, adrenergic stimulation  Antimalarials Chloroquine Hydroxychloroquine Intracellular  inhibition of lysosomal enzymes  Urologic Agents Alpha Blockers Doxazosin Prazosin Tamsulosin Terazosin  Increased renin and aldosterone  Adapted from Page Carleene Overlie, et al. "Drugs That May Cause or Exacerbate Heart Failure: A Scientific Statement from the American Heart  Association." Circulation 2016; 134:e32-e69. DOI: 10.1161/CIR.0000000000000426   MEDICATION ADHERENCES TIPS AND STRATEGIES Taking medication as prescribed improves patient outcomes in heart failure (reduces hospitalizations, improves symptoms, increases survival) Side effects of medications can be managed by decreasing doses, switching agents, stopping drugs, or adding additional therapy. Please let someone in the [facility] know if you have having bothersome side effects so we can modify your regimen. Do not alter your medication regimen without talking to [place].  Medication reminders can help patients remember to take drugs on time. If you are missing or forgetting doses you can try linking behaviors, using pill boxes, or an electronic reminder like an alarm on your phone or an app. Some people can also get automated phone calls as medication reminders.

## 2022-08-08 DIAGNOSIS — D631 Anemia in chronic kidney disease: Secondary | ICD-10-CM | POA: Diagnosis not present

## 2022-08-08 DIAGNOSIS — I10 Essential (primary) hypertension: Secondary | ICD-10-CM | POA: Diagnosis not present

## 2022-08-08 DIAGNOSIS — R809 Proteinuria, unspecified: Secondary | ICD-10-CM | POA: Diagnosis not present

## 2022-08-08 DIAGNOSIS — N2581 Secondary hyperparathyroidism of renal origin: Secondary | ICD-10-CM | POA: Diagnosis not present

## 2022-08-08 DIAGNOSIS — E1122 Type 2 diabetes mellitus with diabetic chronic kidney disease: Secondary | ICD-10-CM | POA: Diagnosis not present

## 2022-08-08 DIAGNOSIS — N184 Chronic kidney disease, stage 4 (severe): Secondary | ICD-10-CM | POA: Diagnosis not present

## 2022-08-10 DIAGNOSIS — E1121 Type 2 diabetes mellitus with diabetic nephropathy: Secondary | ICD-10-CM | POA: Diagnosis not present

## 2022-08-12 DIAGNOSIS — Z794 Long term (current) use of insulin: Secondary | ICD-10-CM | POA: Diagnosis not present

## 2022-08-12 DIAGNOSIS — I5033 Acute on chronic diastolic (congestive) heart failure: Secondary | ICD-10-CM | POA: Diagnosis not present

## 2022-08-12 DIAGNOSIS — E1122 Type 2 diabetes mellitus with diabetic chronic kidney disease: Secondary | ICD-10-CM | POA: Diagnosis not present

## 2022-08-12 DIAGNOSIS — I10 Essential (primary) hypertension: Secondary | ICD-10-CM | POA: Diagnosis not present

## 2022-08-12 DIAGNOSIS — N184 Chronic kidney disease, stage 4 (severe): Secondary | ICD-10-CM | POA: Diagnosis not present

## 2022-08-14 DIAGNOSIS — I700 Atherosclerosis of aorta: Secondary | ICD-10-CM | POA: Diagnosis not present

## 2022-08-14 DIAGNOSIS — N184 Chronic kidney disease, stage 4 (severe): Secondary | ICD-10-CM | POA: Diagnosis not present

## 2022-08-14 DIAGNOSIS — I2510 Atherosclerotic heart disease of native coronary artery without angina pectoris: Secondary | ICD-10-CM | POA: Diagnosis not present

## 2022-08-14 DIAGNOSIS — I5032 Chronic diastolic (congestive) heart failure: Secondary | ICD-10-CM | POA: Diagnosis not present

## 2022-08-14 DIAGNOSIS — G4733 Obstructive sleep apnea (adult) (pediatric): Secondary | ICD-10-CM | POA: Diagnosis not present

## 2022-08-14 DIAGNOSIS — I10 Essential (primary) hypertension: Secondary | ICD-10-CM | POA: Diagnosis not present

## 2022-08-14 DIAGNOSIS — R600 Localized edema: Secondary | ICD-10-CM | POA: Diagnosis not present

## 2022-08-14 DIAGNOSIS — E7800 Pure hypercholesterolemia, unspecified: Secondary | ICD-10-CM | POA: Diagnosis not present

## 2022-08-16 ENCOUNTER — Other Ambulatory Visit (INDEPENDENT_AMBULATORY_CARE_PROVIDER_SITE_OTHER): Payer: Self-pay | Admitting: Vascular Surgery

## 2022-08-16 DIAGNOSIS — N184 Chronic kidney disease, stage 4 (severe): Secondary | ICD-10-CM

## 2022-08-19 ENCOUNTER — Ambulatory Visit (INDEPENDENT_AMBULATORY_CARE_PROVIDER_SITE_OTHER): Payer: PPO

## 2022-08-19 DIAGNOSIS — N184 Chronic kidney disease, stage 4 (severe): Secondary | ICD-10-CM

## 2022-08-20 DIAGNOSIS — E1122 Type 2 diabetes mellitus with diabetic chronic kidney disease: Secondary | ICD-10-CM | POA: Diagnosis not present

## 2022-08-20 DIAGNOSIS — N184 Chronic kidney disease, stage 4 (severe): Secondary | ICD-10-CM | POA: Diagnosis not present

## 2022-08-20 DIAGNOSIS — G4733 Obstructive sleep apnea (adult) (pediatric): Secondary | ICD-10-CM | POA: Diagnosis not present

## 2022-08-20 DIAGNOSIS — R809 Proteinuria, unspecified: Secondary | ICD-10-CM | POA: Diagnosis not present

## 2022-08-23 ENCOUNTER — Ambulatory Visit (INDEPENDENT_AMBULATORY_CARE_PROVIDER_SITE_OTHER): Payer: PPO | Admitting: Vascular Surgery

## 2022-08-23 ENCOUNTER — Encounter (INDEPENDENT_AMBULATORY_CARE_PROVIDER_SITE_OTHER): Payer: Self-pay | Admitting: Vascular Surgery

## 2022-08-23 VITALS — BP 148/71 | HR 69 | Ht 70.0 in | Wt 309.0 lb

## 2022-08-23 DIAGNOSIS — Z794 Long term (current) use of insulin: Secondary | ICD-10-CM

## 2022-08-23 DIAGNOSIS — E785 Hyperlipidemia, unspecified: Secondary | ICD-10-CM

## 2022-08-23 DIAGNOSIS — E1122 Type 2 diabetes mellitus with diabetic chronic kidney disease: Secondary | ICD-10-CM

## 2022-08-23 DIAGNOSIS — D631 Anemia in chronic kidney disease: Secondary | ICD-10-CM

## 2022-08-23 DIAGNOSIS — N184 Chronic kidney disease, stage 4 (severe): Secondary | ICD-10-CM

## 2022-08-23 DIAGNOSIS — M7989 Other specified soft tissue disorders: Secondary | ICD-10-CM | POA: Diagnosis not present

## 2022-08-23 DIAGNOSIS — I10 Essential (primary) hypertension: Secondary | ICD-10-CM

## 2022-08-23 NOTE — H&P (View-Only) (Signed)
Patient ID: James Clarke, male   DOB: 25-Jul-1940, 82 y.o.   MRN: [id number]  Chief Complaint  Patient presents with   Follow-up    HPI James Clarke is a 82 y.o. male.  I am asked to see the patient by Dr. Holley Raring for evaluation of dialysis access placement.  His most recent GFR was 19.  He did have an episode last year where he had acute renal failure on top of his chronic kidney disease but he was able to come off dialysis during that hospitalization.  He has never had a fistula or graft placed.  He is right-hand dominant.  A vein mapping and arterial study were done this week for evaluation.  His arterial studies were relatively normal in the right and left upper extremities.  He had adequate cephalic and basilic vein throughout in both upper extremities.   We also follow him for leg swelling.  This has been well-controlled with compression socks and elevation as well as increasing activity.  He has mild swelling today but nothing severe.   Past Medical History:  Diagnosis Date   Asthma    Bell's palsy    SEVERAL TIMES   Bell's palsy    Cancer (HCC)    SKIN   CHF (congestive heart failure) (HCC)    Diabetes mellitus without complication ([place])    Dyspnea    DOE   History of kidney stones    Hyperlipemia    Hypertension    Kidney stone    Neuropathy    Obesity    Sleep apnea    C PAP    Past Surgical History:  Procedure Laterality Date   APPENDECTOMY     CATARACT EXTRACTION W/PHACO Left 05/20/2017   Procedure: CATARACT EXTRACTION PHACO AND INTRAOCULAR LENS PLACEMENT ([place]);  Surgeon: Birder Robson, MD;  Location: [facility];  Service: Ophthalmology;  Laterality: Left;  [place] 00:32.0 AP% 15.5 CDE 4.97 Fluid Pack Lot # [id number]   CATARACT EXTRACTION W/PHACO Right 06/10/2017   Procedure: CATARACT EXTRACTION PHACO AND INTRAOCULAR LENS PLACEMENT ([facility]);  Surgeon: Birder Robson, MD;  Location: [facility];  Service: Ophthalmology;  Laterality: Right;  [place]  00:51 AP%  15.4 CDE 7.95 Fluid pack lot # [id number]   CHOLECYSTECTOMY     CYSTOSCOPY W/ RETROGRADES Right 08/13/2019   Procedure: CYSTOSCOPY WITH RETROGRADE PYELOGRAM;  Surgeon: Billey Co, MD;  Location: [facility];  Service: Urology;  Laterality: Right;   CYSTOSCOPY WITH STENT PLACEMENT Right 07/26/2019   Procedure: CYSTOSCOPY WITH STENT PLACEMENT;  Surgeon: Lucas Mallow, MD;  Location: [facility];  Service: Urology;  Laterality: Right;   CYSTOSCOPY/URETEROSCOPY/HOLMIUM LASER/STENT PLACEMENT Right 08/13/2019   Procedure: CYSTOSCOPY/URETEROSCOPY/HOLMIUM LASER/STENT EXCHANGE;  Surgeon: Billey Co, MD;  Location: [facility];  Service: Urology;  Laterality: Right;   CYSTOSCOPY/URETEROSCOPY/HOLMIUM LASER/STENT PLACEMENT Left 02/27/2022   Procedure: CYSTOSCOPY/URETEROSCOPY/HOLMIUM LASER/STENT PLACEMENT;  Surgeon: Abbie Sons, MD;  Location: [facility];  Service: Urology;  Laterality: Left;   FRACTURE SURGERY     WRIST   HAND SURGERY     HERNIA REPAIR     LEFT HEART CATH AND CORONARY ANGIOGRAPHY N/A 12/31/2021   Procedure: LEFT HEART CATH AND CORONARY ANGIOGRAPHY;  Surgeon: Corey Skains, MD;  Location: [place] [facility];  Service: Cardiovascular;  Laterality: N/A;   SHOULDER ARTHROSCOPY WITH SUBACROMIAL DECOMPRESSION AND OPEN ROTATOR C Right 09/11/2020   Procedure: Right shoulder arthroscopic rotator cuff repair vs Regeneten patch application, subacromial decompression, and biceps tenodesis -  Reche Dixon to Assist;  Surgeon: Leim Fabry, MD;  Location: [facility];  Service: Orthopedics;  Laterality: Right;   TEMPORARY DIALYSIS CATHETER N/A 03/05/2022   Procedure: TEMPORARY DIALYSIS CATHETER;  Surgeon: Katha Cabal, MD;  Location: [facility];  Service: Cardiovascular;  Laterality: N/A;   TONSILLECTOMY     URETEROSCOPY WITH HOLMIUM LASER LITHOTRIPSY Left 02/27/2022   Procedure: URETEROSCOPY WITH HOLMIUM LASER LITHOTRIPSY;  Surgeon: Abbie Sons, MD;  Location: [facility];  Service:  Urology;  Laterality: Left;     Family History  Problem Relation Age of Onset   Emphysema Mother    COPD Mother    Heart disease Mother    Brain cancer Father      Social History   Tobacco Use   Smoking status: Never    Passive exposure: Never   Smokeless tobacco: Never  Vaping Use   Vaping Use: Never used  Substance Use Topics   Alcohol use: No   Drug use: Not Currently     Allergies  Allergen Reactions   Codeine Nausea And Vomiting   Doxycycline    Erythromycin Rash    Current Outpatient Medications  Medication Sig Dispense Refill   AMLODIPINE BESYLATE PO Take 5 mg by mouth daily at 6 (six) AM.     aspirin EC 81 MG tablet Take by mouth.     calcitRIOL (ROCALTROL) 0.25 MCG capsule Take 0.25 mcg by mouth daily.     Continuous Blood Gluc Sensor (FREESTYLE LIBRE 14 DAY SENSOR) MISC by Does not apply route.     furosemide (LASIX) 40 MG tablet Take 80 mg q am and 40 mg qpm 60 tablet 2   hydrALAZINE (APRESOLINE) 50 MG tablet Take 50 mg by mouth 3 (three) times daily.     insulin aspart (NOVOLOG) 100 UNIT/ML injection Inject 30 units before breakfast and lunch. Inject 55 units before supper.     insulin detemir (LEVEMIR) 100 UNIT/ML injection Inject 0.5 mLs (50 Units total) into the skin at bedtime. 10 mL 1   isosorbide mononitrate (IMDUR) 30 MG 24 hr tablet Take 1 tablet (30 mg total) by mouth daily. 30 tablet 2   lovastatin (MEVACOR) 40 MG tablet Take 40 mg by mouth daily with supper.     metoprolol succinate (TOPROL-XL) 50 MG 24 hr tablet Take 50 mg by mouth daily.     potassium citrate (UROCIT-K) 10 MEQ (1080 MG) SR tablet Take 10 mEq by mouth in the morning and at bedtime.     tamsulosin (FLOMAX) 0.4 MG CAPS capsule TAKE (1) CAPSULE BY MOUTH EVERY DAY 30 capsule 1   acetaminophen (TYLENOL) 500 MG tablet Take 1,000 mg by mouth every 6 (six) hours as needed (shoulder pain).     albuterol (PROVENTIL HFA;VENTOLIN HFA) 108 (90 Base) MCG/ACT inhaler Inhale 2 puffs into the  lungs every 6 (six) hours as needed for wheezing or shortness of breath.     cetirizine (ZYRTEC) 10 MG chewable tablet Chew 10 mg by mouth daily as needed for allergies.     fluticasone (FLONASE) 50 MCG/ACT nasal spray as needed.     GLOBAL INJECT EASE INSULIN SYR 31G X 5/16" 0.5 ML MISC Inject into the skin.     glucose blood (ONETOUCH ULTRA) test strip TEST BLOOD SUGAR 4 TIMES DAILY     Lancets (ONETOUCH DELICA PLUS Q000111Q) MISC USE 1 LANCET 4 TIMES DAILY     nitroGLYCERIN (NITROSTAT) 0.4 MG SL tablet Place 1 tablet (0.4 mg total)  under the tongue every 5 (five) minutes as needed for chest pain. 20 tablet 1   traMADol (ULTRAM) 50 MG tablet Take 50 mg by mouth every 12 (twelve) hours as needed.     No current facility-administered medications for this visit.      REVIEW OF SYSTEMS (Negative unless checked)  Constitutional: '[]'$ Weight loss  '[]'$ Fever  '[]'$ Chills Cardiac: '[]'$ Chest pain   '[]'$ Chest pressure   '[]'$ Palpitations   '[]'$ Shortness of breath when laying flat   '[]'$ Shortness of breath at rest   '[x]'$ Shortness of breath with exertion. Vascular:  '[]'$ Pain in legs with walking   '[]'$ Pain in legs at rest   '[]'$ Pain in legs when laying flat   '[]'$ Claudication   '[]'$ Pain in feet when walking  '[]'$ Pain in feet at rest  '[]'$ Pain in feet when laying flat   '[]'$ History of DVT   '[]'$ Phlebitis   '[x]'$ Swelling in legs   '[]'$ Varicose veins   '[]'$ Non-healing ulcers Pulmonary:   '[]'$ Uses home oxygen   '[]'$ Productive cough   '[]'$ Hemoptysis   '[]'$ Wheeze  '[]'$ COPD   '[]'$ Asthma Neurologic:  '[]'$ Dizziness  '[]'$ Blackouts   '[]'$ Seizures   '[]'$ History of stroke   '[]'$ History of TIA  '[]'$ Aphasia   '[]'$ Temporary blindness   '[]'$ Dysphagia   '[]'$ Weakness or numbness in arms   '[]'$ Weakness or numbness in legs Musculoskeletal:  '[x]'$ Arthritis   '[]'$ Joint swelling   '[x]'$ Joint pain   '[]'$ Low back pain Hematologic:  '[]'$ Easy bruising  '[]'$ Easy bleeding   '[]'$ Hypercoagulable state   '[x]'$ Anemic  '[]'$ Hepatitis Gastrointestinal:  '[]'$ Blood in stool   '[]'$ Vomiting blood  '[]'$ Gastroesophageal reflux/heartburn    '[]'$ Abdominal pain Genitourinary:  '[x]'$ Chronic kidney disease   '[]'$ Difficult urination  '[]'$ Frequent urination  '[]'$ Burning with urination   '[]'$ Hematuria Skin:  '[]'$ Rashes   '[]'$ Ulcers   '[]'$ Wounds Psychological:  '[]'$ History of anxiety   '[]'$  History of major depression.    Physical Exam BP (!) 148/71   Pulse 69   Ht '5\' 10"'$  (1.778 m)   Wt (!) 309 lb (140.2 kg)   BMI 44.34 kg/m  Gen:  WD/WN, NAD Head: [place]/AT, No temporalis wasting.  Ear/Nose/Throat: Hearing grossly intact, nares w/o erythema or drainage, oropharynx w/o Erythema/Exudate Eyes: Conjunctiva clear, sclera non-icteric  Neck: trachea midline.  No JVD.  Pulmonary:  Good air movement, respirations not labored, no use of accessory muscles  Cardiac: RRR, no JVD Vascular:  Vessel Right Left  Radial Palpable Palpable                          DP 1+ 2+  PT 1+ 1+   Gastrointestinal:. No masses, surgical incisions, or scars. Musculoskeletal: M/S 5/5 throughout.  Extremities without ischemic changes.  No deformity or atrophy. 1+ RLE edema, trace LLE edema. Neurologic: Sensation grossly intact in extremities.  Symmetrical.  Speech is fluent. Motor exam as listed above. Psychiatric: Judgment intact, Mood & affect appropriate for pt's clinical situation. Dermatologic: No rashes or ulcers noted.  No cellulitis or open wounds.    Radiology VAS [place] UPPER EXT VEIN MAPPING (PRE-OP AVF)  Result Date: 08/23/2022 UPPER EXTREMITY VEIN MAPPING Patient Name:  James Clarke  Date of Exam:   08/19/2022 Medical Rec #: [id number]        Accession #:    [id number] Date of Birth: 09-17-1940       Patient Gender: M Patient Age:   9 years Exam Location:  [facility] Procedure:      VAS [place] UPPER EXT VEIN MAPPING (PRE-OP AVF) Referring Phys:  GREGORY SCHNIER --------------------------------------------------------------------------------  Indications: Pre-access. History: CKD4.  Performing Technologist: Concha Norway RVT  Examination Guidelines: A complete  evaluation includes B-mode imaging, spectral Doppler, color Doppler, and power Doppler as needed of all accessible portions of each vessel. Bilateral testing is considered an integral part of a complete examination. Limited examinations for reoccurring indications may be performed as noted. +-----------------+-------------+----------+--------+ Right Cephalic   Diameter (cm)Depth (cm)Findings +-----------------+-------------+----------+--------+ Prox upper arm       0.44                        +-----------------+-------------+----------+--------+ Mid upper arm        0.57                        +-----------------+-------------+----------+--------+ Dist upper arm       0.54                        +-----------------+-------------+----------+--------+ Antecubital fossa    0.49                        +-----------------+-------------+----------+--------+ Prox forearm         0.55                        +-----------------+-------------+----------+--------+ Mid forearm          0.30                        +-----------------+-------------+----------+--------+ Dist forearm         0.23                        +-----------------+-------------+----------+--------+ +-----------------+-------------+----------+--------+ Right Basilic    Diameter (cm)Depth (cm)Findings +-----------------+-------------+----------+--------+ Prox upper arm       0.32                        +-----------------+-------------+----------+--------+ Mid upper arm        0.52                        +-----------------+-------------+----------+--------+ Dist upper arm       0.48                        +-----------------+-------------+----------+--------+ Antecubital fossa    0.44                        +-----------------+-------------+----------+--------+ Prox forearm         0.40                        +-----------------+-------------+----------+--------+  +-----------------+-------------+----------+---------+ Left Cephalic    Diameter (cm)Depth (cm)Findings  +-----------------+-------------+----------+---------+ Prox upper arm       0.34                         +-----------------+-------------+----------+---------+ Mid upper arm        0.28                         +-----------------+-------------+----------+---------+ Dist upper arm       0.34                         +-----------------+-------------+----------+---------+  Antecubital fossa    0.34                         +-----------------+-------------+----------+---------+ Prox forearm         0.42               branching +-----------------+-------------+----------+---------+ Mid forearm          0.25               branching +-----------------+-------------+----------+---------+ Dist forearm         0.34                         +-----------------+-------------+----------+---------+ +-----------------+-------------+----------+--------+ Left Basilic     Diameter (cm)Depth (cm)Findings +-----------------+-------------+----------+--------+ Prox upper arm       0.45                        +-----------------+-------------+----------+--------+ Mid upper arm        0.44                        +-----------------+-------------+----------+--------+ Dist upper arm       0.34                        +-----------------+-------------+----------+--------+ Antecubital fossa    0.53                        +-----------------+-------------+----------+--------+ Prox forearm         0.42                        +-----------------+-------------+----------+--------+ Summary: Right: NL compressible veins with appropriate diameters. Left: NL compressible veins with appropriate diameters. *See table(s) above for measurements and observations.  Diagnosing physician: Leotis Pain MD Electronically signed by Leotis Pain MD on 08/23/2022 at 9:50:45 AM.    Final    VAS [place] UPPER  EXTREMITY ARTERIAL DUPLEX  Result Date: 08/23/2022  UPPER EXTREMITY DUPLEX STUDY Patient Name:  James Clarke  Date of Exam:   08/19/2022 Medical Rec #: [id number]        Accession #:    [id number] Date of Birth: 04/12/1941       Patient Gender: M Patient Age:   45 years Exam Location:  [facility] Procedure:      VAS [place] UPPER EXTREMITY ARTERIAL DUPLEX Referring Phys: Hortencia Pilar --------------------------------------------------------------------------------  Limitations: CKD4 Performing Technologist: Concha Norway RVT  Examination Guidelines: A complete evaluation includes B-mode imaging, spectral Doppler, color Doppler, and power Doppler as needed of all accessible portions of each vessel. Bilateral testing is considered an integral part of a complete examination. Limited examinations for reoccurring indications may be performed as noted.  Right Pre-Dialysis Findings: +-----------------------+----------+--------------------+---------+--------+ Location               PSV (cm/s)Intralum. Diam. (cm)Waveform Comments +-----------------------+----------+--------------------+---------+--------+ Brachial Antecub. fossa82        0.52                triphasic         +-----------------------+----------+--------------------+---------+--------+ Radial Art at Wrist    89        0.32                triphasic         +-----------------------+----------+--------------------+---------+--------+ Ulnar Art at Wrist  84        0.31                triphasic         +-----------------------+----------+--------------------+---------+--------+  Left Pre-Dialysis Findings: +-----------------------+----------+--------------------+---------+--------+ Location               PSV (cm/s)Intralum. Diam. (cm)Waveform Comments +-----------------------+----------+--------------------+---------+--------+ Brachial Antecub. fossa103       0.52                triphasic          +-----------------------+----------+--------------------+---------+--------+ Radial Art at Wrist    88        0.32                biphasic          +-----------------------+----------+--------------------+---------+--------+ Ulnar Art at Wrist     79        0.28                biphasic          +-----------------------+----------+--------------------+---------+--------+  Summary:  Right: No obstruction visualized in the right upper extremity        Minimal atherosclerosis. Left: No obstruction visualized in the left upper extremity Mild       atherosclerosis. *See table(s) above for measurements and observations. Electronically signed by Leotis Pain MD on 08/23/2022 at 9:50:27 AM.    Final     Labs Recent Results (from the past 2160 hour(s))  Basic metabolic panel     Status: Abnormal   Collection Time: 06/12/22 12:59 PM  Result Value Ref Range   Sodium 140 135 - 145 mmol/L   Potassium 4.3 3.5 - 5.1 mmol/L   Chloride 108 98 - 111 mmol/L   CO2 25 22 - 32 mmol/L   Glucose, Bld 184 (H) 70 - 99 mg/dL    Comment: Glucose reference range applies only to samples taken after fasting for at least 8 hours.   BUN 56 (H) 8 - 23 mg/dL   Creatinine, Ser 2.90 (H) 0.61 - 1.24 mg/dL   Calcium 8.9 8.9 - 10.3 mg/dL   GFR, Estimated 21 (L) >60 mL/min    Comment: (NOTE) Calculated using the CKD-EPI Creatinine Equation (2021)    Anion gap 7 5 - 15    Comment: Performed at [facility], [address] [place], [place] [address]  CBC     Status: Abnormal   Collection Time: 06/12/22 12:59 PM  Result Value Ref Range   WBC 11.8 (H) 4.0 - 10.5 K/uL   RBC 4.09 (L) 4.22 - 5.81 MIL/uL   Hemoglobin 11.7 (L) 13.0 - 17.0 g/dL   HCT 36.7 (L) 39.0 - 52.0 %   MCV 89.7 80.0 - 100.0 fL   MCH 28.6 26.0 - 34.0 pg   MCHC 31.9 30.0 - 36.0 g/dL   RDW 13.8 11.5 - 15.5 %   Platelets 307 150 - 400 K/uL   nRBC 0.0 0.0 - 0.2 %    Comment: Performed at [facility], [address] [place], [place]  [address]  Troponin I (High Sensitivity)     Status: Abnormal   Collection Time: 06/12/22 12:59 PM  Result Value Ref Range   Troponin I (High Sensitivity) 61 (H) <18 ng/L    Comment: (NOTE) Elevated high sensitivity troponin I (hsTnI) values and significant  changes across serial measurements may suggest ACS but many other  chronic and acute conditions are known to elevate hsTnI results.  Refer  to the "Links" section for chest pain algorithms and additional  guidance. Performed at [facility], [address] [place], [place] [address]   Brain natriuretic peptide     Status: Abnormal   Collection Time: 06/12/22 12:59 PM  Result Value Ref Range   B Natriuretic Peptide 415.8 (H) 0.0 - 100.0 pg/mL    Comment: Performed at [facility], [address] [place], [place] [address]  Resp Panel by RT-PCR (Flu A&B, Covid) Anterior Nasal Swab     Status: None   Collection Time: 06/12/22  2:20 PM   Specimen: Anterior Nasal Swab  Result Value Ref Range   SARS Coronavirus 2 by RT PCR NEGATIVE NEGATIVE    Comment: (NOTE) SARS-CoV-2 target nucleic acids are NOT DETECTED.  The SARS-CoV-2 RNA is generally detectable in upper respiratory specimens during the acute phase of infection. The lowest concentration of SARS-CoV-2 viral copies this assay can detect is 138 copies/mL. A negative result does not preclude SARS-Cov-2 infection and should not be used as the sole basis for treatment or other patient management decisions. A negative result may occur with  improper specimen collection/handling, submission of specimen other than nasopharyngeal swab, presence of viral mutation(s) within the areas targeted by this assay, and inadequate number of viral copies(<138 copies/mL). A negative result must be combined with clinical observations, patient history, and epidemiological information. The expected result is Negative.  Fact Sheet for Patients:   EntrepreneurPulse.com.au  Fact Sheet for Healthcare Providers:  IncredibleEmployment.be  This test is no t yet approved or cleared by the [place] FDA and  has been authorized for detection and/or diagnosis of SARS-CoV-2 by FDA under an Emergency Use Authorization (EUA). This EUA will remain  in effect (meaning this test can be used) for the duration of the COVID-19 declaration under Section 564(b)(1) of the Act, 21 U.S.C.section 360bbb-3(b)(1), unless the authorization is terminated  or revoked sooner.       Influenza A by PCR NEGATIVE NEGATIVE   Influenza B by PCR NEGATIVE NEGATIVE    Comment: (NOTE) The Xpert Xpress SARS-CoV-2/FLU/RSV plus assay is intended as an aid in the diagnosis of influenza from Nasopharyngeal swab specimens and should not be used as a sole basis for treatment. Nasal washings and aspirates are unacceptable for Xpert Xpress SARS-CoV-2/FLU/RSV testing.  Fact Sheet for Patients: EntrepreneurPulse.com.au  Fact Sheet for Healthcare Providers: IncredibleEmployment.be  This test is not yet approved or cleared by the [place] FDA and has been authorized for detection and/or diagnosis of SARS-CoV-2 by FDA under an Emergency Use Authorization (EUA). This EUA will remain in effect (meaning this test can be used) for the duration of the COVID-19 declaration under Section 564(b)(1) of the Act, 21 U.S.C. section 360bbb-3(b)(1), unless the authorization is terminated or revoked.  Performed at [facility], [place], [place] [address]   Troponin I (High Sensitivity)     Status: Abnormal   Collection Time: 06/12/22  5:06 PM  Result Value Ref Range   Troponin I (High Sensitivity) 62 (H) <18 ng/L    Comment: (NOTE) Elevated high sensitivity troponin I (hsTnI) values and significant  changes across serial measurements may suggest ACS but many other  chronic  and acute conditions are known to elevate hsTnI results.  Refer to the "Links" section for chest pain algorithms and additional  guidance. Performed at [facility], [address], [place], [place] [address]   CBG monitoring, ED     Status: Abnormal  Collection Time: 06/12/22  5:18 PM  Result Value Ref Range   Glucose-Capillary 194 (H) 70 - 99 mg/dL    Comment: Glucose reference range applies only to samples taken after fasting for at least 8 hours.  Glucose, capillary     Status: Abnormal   Collection Time: 06/12/22  8:18 PM  Result Value Ref Range   Glucose-Capillary 167 (H) 70 - 99 mg/dL    Comment: Glucose reference range applies only to samples taken after fasting for at least 8 hours.  Troponin I (High Sensitivity)     Status: Abnormal   Collection Time: 06/12/22  8:19 PM  Result Value Ref Range   Troponin I (High Sensitivity) 75 (H) <18 ng/L    Comment: (NOTE) Elevated high sensitivity troponin I (hsTnI) values and significant  changes across serial measurements may suggest ACS but many other  chronic and acute conditions are known to elevate hsTnI results.  Refer to the "Links" section for chest pain algorithms and additional  guidance. Performed at [facility], [address] [place], [place] [address]   Glucose, capillary     Status: Abnormal   Collection Time: 06/13/22 12:21 AM  Result Value Ref Range   Glucose-Capillary 148 (H) 70 - 99 mg/dL    Comment: Glucose reference range applies only to samples taken after fasting for at least 8 hours.  Troponin I (High Sensitivity)     Status: Abnormal   Collection Time: 06/13/22 12:25 AM  Result Value Ref Range   Troponin I (High Sensitivity) 82 (H) <18 ng/L    Comment: (NOTE) Elevated high sensitivity troponin I (hsTnI) values and significant  changes across serial measurements may suggest ACS but many other  chronic and acute conditions are known to elevate hsTnI results.  Refer to the "Links"  section for chest pain algorithms and additional  guidance. Performed at [facility], [address] [place], [place] [address]   Magnesium     Status: None   Collection Time: 06/13/22 12:25 AM  Result Value Ref Range   Magnesium 2.1 1.7 - 2.4 mg/dL    Comment: Performed at [facility], [address] [place], [place] [address]  CBC     Status: Abnormal   Collection Time: 06/13/22  2:40 AM  Result Value Ref Range   WBC 10.8 (H) 4.0 - 10.5 K/uL   RBC 3.75 (L) 4.22 - 5.81 MIL/uL   Hemoglobin 10.7 (L) 13.0 - 17.0 g/dL   HCT 33.4 (L) 39.0 - 52.0 %   MCV 89.1 80.0 - 100.0 fL   MCH 28.5 26.0 - 34.0 pg   MCHC 32.0 30.0 - 36.0 g/dL   RDW 13.8 11.5 - 15.5 %   Platelets 265 150 - 400 K/uL   nRBC 0.0 0.0 - 0.2 %    Comment: Performed at [facility], [address] [place], [place] [id number]  Basic metabolic panel     Status: Abnormal   Collection Time: 06/13/22  2:40 AM  Result Value Ref Range   Sodium 140 135 - 145 mmol/L   Potassium 3.9 3.5 - 5.1 mmol/L   Chloride 107 98 - 111 mmol/L   CO2 27 22 - 32 mmol/L   Glucose, Bld 178 (H) 70 - 99 mg/dL    Comment: Glucose reference range applies only to samples taken after fasting for at least 8 hours.   BUN 57 (H) 8 - 23 mg/dL   Creatinine, Ser 3.02 (H) 0.61 - 1.24 mg/dL  Calcium 8.5 (L) 8.9 - 10.3 mg/dL   GFR, Estimated 20 (L) >60 mL/min    Comment: (NOTE) Calculated using the CKD-EPI Creatinine Equation (2021)    Anion gap 6 5 - 15    Comment: Performed at [facility], [place], [place] [address]  Troponin I (High Sensitivity)     Status: Abnormal   Collection Time: 06/13/22  2:40 AM  Result Value Ref Range   Troponin I (High Sensitivity) 75 (H) <18 ng/L    Comment: (NOTE) Elevated high sensitivity troponin I (hsTnI) values and significant  changes across serial measurements may suggest ACS but many other  chronic and acute conditions are known to elevate hsTnI results.   Refer to the "Links" section for chest pain algorithms and additional  guidance. Performed at [facility], [address] [place], [place] [address]   Glucose, capillary     Status: Abnormal   Collection Time: 06/13/22  7:18 AM  Result Value Ref Range   Glucose-Capillary 232 (H) 70 - 99 mg/dL    Comment: Glucose reference range applies only to samples taken after fasting for at least 8 hours.  Glucose, capillary     Status: Abnormal   Collection Time: 06/13/22 11:38 AM  Result Value Ref Range   Glucose-Capillary 289 (H) 70 - 99 mg/dL    Comment: Glucose reference range applies only to samples taken after fasting for at least 8 hours.  Glucose, capillary     Status: Abnormal   Collection Time: 06/13/22  4:43 PM  Result Value Ref Range   Glucose-Capillary 263 (H) 70 - 99 mg/dL    Comment: Glucose reference range applies only to samples taken after fasting for at least 8 hours.  Glucose, capillary     Status: Abnormal   Collection Time: 06/13/22  8:04 PM  Result Value Ref Range   Glucose-Capillary 287 (H) 70 - 99 mg/dL    Comment: Glucose reference range applies only to samples taken after fasting for at least 8 hours.  CBC     Status: Abnormal   Collection Time: 06/14/22  4:47 AM  Result Value Ref Range   WBC 10.9 (H) 4.0 - 10.5 K/uL   RBC 3.58 (L) 4.22 - 5.81 MIL/uL   Hemoglobin 10.2 (L) 13.0 - 17.0 g/dL   HCT 32.1 (L) 39.0 - 52.0 %   MCV 89.7 80.0 - 100.0 fL   MCH 28.5 26.0 - 34.0 pg   MCHC 31.8 30.0 - 36.0 g/dL   RDW 13.7 11.5 - 15.5 %   Platelets 269 150 - 400 K/uL   nRBC 0.0 0.0 - 0.2 %    Comment: Performed at [facility], [address] [place],  [id number]  Basic metabolic panel     Status: Abnormal   Collection Time: 06/14/22  4:47 AM  Result Value Ref Range   Sodium 142 135 - 145 mmol/L   Potassium 3.8 3.5 - 5.1 mmol/L   Chloride 105 98 - 111 mmol/L   CO2 28 22 - 32 mmol/L   Glucose, Bld 193 (H) 70 - 99 mg/dL    Comment: Glucose  reference range applies only to samples taken after fasting for at least 8 hours.   BUN 67 (H) 8 - 23 mg/dL   Creatinine, Ser 3.41 (H) 0.61 - 1.24 mg/dL   Calcium 8.3 (L) 8.9 - 10.3 mg/dL   GFR, Estimated 17 (L) >60 mL/min    Comment: (NOTE) Calculated using the CKD-EPI  Creatinine Equation (2021)    Anion gap 9 5 - 15    Comment: Performed at [facility], [address] [place], [place] [address]  Glucose, capillary     Status: Abnormal   Collection Time: 06/14/22  8:16 AM  Result Value Ref Range   Glucose-Capillary 212 (H) 70 - 99 mg/dL    Comment: Glucose reference range applies only to samples taken after fasting for at least 8 hours.  Glucose, capillary     Status: Abnormal   Collection Time: 06/14/22 12:03 PM  Result Value Ref Range   Glucose-Capillary 260 (H) 70 - 99 mg/dL    Comment: Glucose reference range applies only to samples taken after fasting for at least 8 hours.  Glucose, capillary     Status: Abnormal   Collection Time: 06/14/22  4:40 PM  Result Value Ref Range   Glucose-Capillary 168 (H) 70 - 99 mg/dL    Comment: Glucose reference range applies only to samples taken after fasting for at least 8 hours.  Glucose, capillary     Status: Abnormal   Collection Time: 06/14/22  8:40 PM  Result Value Ref Range   Glucose-Capillary 269 (H) 70 - 99 mg/dL    Comment: Glucose reference range applies only to samples taken after fasting for at least 8 hours.  CBC     Status: Abnormal   Collection Time: 06/15/22  4:45 AM  Result Value Ref Range   WBC 9.5 4.0 - 10.5 K/uL   RBC 3.44 (L) 4.22 - 5.81 MIL/uL   Hemoglobin 9.9 (L) 13.0 - 17.0 g/dL   HCT 30.6 (L) 39.0 - 52.0 %   MCV 89.0 80.0 - 100.0 fL   MCH 28.8 26.0 - 34.0 pg   MCHC 32.4 30.0 - 36.0 g/dL   RDW 13.6 11.5 - 15.5 %   Platelets 271 150 - 400 K/uL   nRBC 0.0 0.0 - 0.2 %    Comment: Performed at [facility], [address], [place], [place] [id number]  Basic metabolic panel     Status:  Abnormal   Collection Time: 06/15/22  4:45 AM  Result Value Ref Range   Sodium 139 135 - 145 mmol/L   Potassium 3.4 (L) 3.5 - 5.1 mmol/L   Chloride 102 98 - 111 mmol/L   CO2 26 22 - 32 mmol/L   Glucose, Bld 208 (H) 70 - 99 mg/dL    Comment: Glucose reference range applies only to samples taken after fasting for at least 8 hours.   BUN 70 (H) 8 - 23 mg/dL   Creatinine, Ser 3.55 (H) 0.61 - 1.24 mg/dL   Calcium 8.3 (L) 8.9 - 10.3 mg/dL   GFR, Estimated 17 (L) >60 mL/min    Comment: (NOTE) Calculated using the CKD-EPI Creatinine Equation (2021)    Anion gap 11 5 - 15    Comment: Performed at [facility], [address] [place],  [address]  Glucose, capillary     Status: Abnormal   Collection Time: 06/15/22  8:03 AM  Result Value Ref Range   Glucose-Capillary 134 (H) 70 - 99 mg/dL    Comment: Glucose reference range applies only to samples taken after fasting for at least 8 hours.  Glucose, capillary     Status: Abnormal   Collection Time: 06/15/22  1:16 PM  Result Value Ref Range   Glucose-Capillary 287 (H) 70 - 99 mg/dL    Comment: Glucose reference range applies only to samples taken after  fasting for at least 8 hours.  Glucose, capillary     Status: Abnormal   Collection Time: 06/15/22  4:43 PM  Result Value Ref Range   Glucose-Capillary 227 (H) 70 - 99 mg/dL    Comment: Glucose reference range applies only to samples taken after fasting for at least 8 hours.  Glucose, capillary     Status: Abnormal   Collection Time: 06/15/22  9:25 PM  Result Value Ref Range   Glucose-Capillary 153 (H) 70 - 99 mg/dL    Comment: Glucose reference range applies only to samples taken after fasting for at least 8 hours.  CBC     Status: Abnormal   Collection Time: 06/16/22  5:15 AM  Result Value Ref Range   WBC 9.7 4.0 - 10.5 K/uL   RBC 3.47 (L) 4.22 - 5.81 MIL/uL   Hemoglobin 10.0 (L) 13.0 - 17.0 g/dL   HCT 30.7 (L) 39.0 - 52.0 %   MCV 88.5 80.0 - 100.0 fL   MCH 28.8 26.0  - 34.0 pg   MCHC 32.6 30.0 - 36.0 g/dL   RDW 13.6 11.5 - 15.5 %   Platelets 274 150 - 400 K/uL   nRBC 0.0 0.0 - 0.2 %    Comment: Performed at [facility], [address], [place], [place] [id number]  Basic metabolic panel     Status: Abnormal   Collection Time: 06/16/22  5:15 AM  Result Value Ref Range   Sodium 141 135 - 145 mmol/L   Potassium 3.4 (L) 3.5 - 5.1 mmol/L   Chloride 105 98 - 111 mmol/L   CO2 27 22 - 32 mmol/L   Glucose, Bld 197 (H) 70 - 99 mg/dL    Comment: Glucose reference range applies only to samples taken after fasting for at least 8 hours.   BUN 73 (H) 8 - 23 mg/dL   Creatinine, Ser 3.58 (H) 0.61 - 1.24 mg/dL   Calcium 8.3 (L) 8.9 - 10.3 mg/dL   GFR, Estimated 16 (L) >60 mL/min    Comment: (NOTE) Calculated using the CKD-EPI Creatinine Equation (2021)    Anion gap 9 5 - 15    Comment: Performed at [facility], [address] [place], [place] [address]  Glucose, capillary     Status: Abnormal   Collection Time: 06/16/22  8:45 AM  Result Value Ref Range   Glucose-Capillary 154 (H) 70 - 99 mg/dL    Comment: Glucose reference range applies only to samples taken after fasting for at least 8 hours.  Glucose, capillary     Status: Abnormal   Collection Time: 06/16/22 12:25 PM  Result Value Ref Range   Glucose-Capillary 238 (H) 70 - 99 mg/dL    Comment: Glucose reference range applies only to samples taken after fasting for at least 8 hours.  Glucose, capillary     Status: Abnormal   Collection Time: 06/16/22  3:29 PM  Result Value Ref Range   Glucose-Capillary 218 (H) 70 - 99 mg/dL    Comment: Glucose reference range applies only to samples taken after fasting for at least 8 hours.  Glucose, capillary     Status: Abnormal   Collection Time: 06/16/22  4:23 PM  Result Value Ref Range   Glucose-Capillary 210 (H) 70 - 99 mg/dL    Comment: Glucose reference range applies only to samples taken after fasting for at least 8 hours.  Glucose,  capillary     Status: Abnormal   Collection Time: 06/16/22  8:54 PM  Result Value Ref Range   Glucose-Capillary 243 (H) 70 - 99 mg/dL    Comment: Glucose reference range applies only to samples taken after fasting for at least 8 hours.  Basic metabolic panel     Status: Abnormal   Collection Time: 06/17/22  4:41 AM  Result Value Ref Range   Sodium 141 135 - 145 mmol/L   Potassium 3.4 (L) 3.5 - 5.1 mmol/L   Chloride 104 98 - 111 mmol/L   CO2 26 22 - 32 mmol/L   Glucose, Bld 207 (H) 70 - 99 mg/dL    Comment: Glucose reference range applies only to samples taken after fasting for at least 8 hours.   BUN 74 (H) 8 - 23 mg/dL   Creatinine, Ser 3.55 (H) 0.61 - 1.24 mg/dL   Calcium 8.4 (L) 8.9 - 10.3 mg/dL   GFR, Estimated 17 (L) >60 mL/min    Comment: (NOTE) Calculated using the CKD-EPI Creatinine Equation (2021)    Anion gap 11 5 - 15    Comment: Performed at [facility], [address] [place], [place] [address]  Glucose, capillary     Status: Abnormal   Collection Time: 06/17/22  8:04 AM  Result Value Ref Range   Glucose-Capillary 208 (H) 70 - 99 mg/dL    Comment: Glucose reference range applies only to samples taken after fasting for at least 8 hours.  Glucose, capillary     Status: Abnormal   Collection Time: 06/17/22 12:30 PM  Result Value Ref Range   Glucose-Capillary 256 (H) 70 - 99 mg/dL    Comment: Glucose reference range applies only to samples taken after fasting for at least 8 hours.  Glucose, capillary     Status: Abnormal   Collection Time: 06/17/22  3:12 PM  Result Value Ref Range   Glucose-Capillary 267 (H) 70 - 99 mg/dL    Comment: Glucose reference range applies only to samples taken after fasting for at least 8 hours.  Glucose, capillary     Status: Abnormal   Collection Time: 06/17/22 10:45 PM  Result Value Ref Range   Glucose-Capillary 143 (H) 70 - 99 mg/dL    Comment: Glucose reference range applies only to samples taken after fasting for at least  8 hours.  Ferritin     Status: None   Collection Time: 06/18/22  5:54 AM  Result Value Ref Range   Ferritin 149 24 - 336 ng/mL    Comment: Performed at [facility], [address] [place], [place] [address]  Hemoglobin     Status: Abnormal   Collection Time: 06/18/22  5:54 AM  Result Value Ref Range   Hemoglobin 10.0 (L) 13.0 - 17.0 g/dL    Comment: Performed at [facility], [address] [place], [place] [id number]  Basic metabolic panel     Status: Abnormal   Collection Time: 06/18/22  5:54 AM  Result Value Ref Range   Sodium 141 135 - 145 mmol/L   Potassium 4.4 3.5 - 5.1 mmol/L   Chloride 105 98 - 111 mmol/L   CO2 26 22 - 32 mmol/L   Glucose, Bld 167 (H) 70 - 99 mg/dL    Comment: Glucose reference range applies only to samples taken after fasting for at least 8 hours.   BUN 72 (H) 8 - 23 mg/dL   Creatinine, Ser 3.71 (H) 0.61 - 1.24 mg/dL   Calcium 7.4 (L) 8.9 - 10.3 mg/dL   GFR, Estimated 16 (  L) >60 mL/min    Comment: (NOTE) Calculated using the CKD-EPI Creatinine Equation (2021)    Anion gap 10 5 - 15    Comment: Performed at [facility], [address] [place], [place] [address]  Glucose, capillary     Status: Abnormal   Collection Time: 06/18/22  8:13 AM  Result Value Ref Range   Glucose-Capillary 150 (H) 70 - 99 mg/dL    Comment: Glucose reference range applies only to samples taken after fasting for at least 8 hours.  Glucose, capillary     Status: Abnormal   Collection Time: 06/18/22 11:35 AM  Result Value Ref Range   Glucose-Capillary 269 (H) 70 - 99 mg/dL    Comment: Glucose reference range applies only to samples taken after fasting for at least 8 hours.  Glucose, capillary     Status: Abnormal   Collection Time: 06/18/22  4:39 PM  Result Value Ref Range   Glucose-Capillary 174 (H) 70 - 99 mg/dL    Comment: Glucose reference range applies only to samples taken after fasting for at least 8 hours.  Glucose, capillary      Status: Abnormal   Collection Time: 06/18/22  8:37 PM  Result Value Ref Range   Glucose-Capillary 167 (H) 70 - 99 mg/dL    Comment: Glucose reference range applies only to samples taken after fasting for at least 8 hours.  Hemoglobin A1c     Status: Abnormal   Collection Time: 06/19/22  7:08 AM  Result Value Ref Range   Hgb A1c MFr Bld 6.6 (H) 4.8 - 5.6 %    Comment: (NOTE)         Prediabetes: 5.7 - 6.4         Diabetes: >6.4         Glycemic control for adults with diabetes: <7.0    Mean Plasma Glucose 143 mg/dL    Comment: (NOTE) Performed At: [facility] [place], [place] [id number] Rush Farmer MD [id number]   Basic metabolic panel     Status: Abnormal   Collection Time: 06/19/22  7:14 AM  Result Value Ref Range   Sodium 142 135 - 145 mmol/L   Potassium 3.5 3.5 - 5.1 mmol/L   Chloride 105 98 - 111 mmol/L   CO2 28 22 - 32 mmol/L   Glucose, Bld 207 (H) 70 - 99 mg/dL    Comment: Glucose reference range applies only to samples taken after fasting for at least 8 hours.   BUN 74 (H) 8 - 23 mg/dL   Creatinine, Ser 3.80 (H) 0.61 - 1.24 mg/dL   Calcium 8.8 (L) 8.9 - 10.3 mg/dL   GFR, Estimated 15 (L) >60 mL/min    Comment: (NOTE) Calculated using the CKD-EPI Creatinine Equation (2021)    Anion gap 9 5 - 15    Comment: Performed at [facility], [address] [place], [place] [address]  Glucose, capillary     Status: Abnormal   Collection Time: 06/19/22  7:59 AM  Result Value Ref Range   Glucose-Capillary 222 (H) 70 - 99 mg/dL    Comment: Glucose reference range applies only to samples taken after fasting for at least 8 hours.  Glucose, capillary     Status: Abnormal   Collection Time: 06/19/22 11:58 AM  Result Value Ref Range   Glucose-Capillary 238 (H) 70 - 99 mg/dL    Comment: Glucose reference range applies only to samples taken after fasting for at least 8  hours.  Glucose, capillary     Status: Abnormal   Collection Time:  06/19/22  3:56 PM  Result Value Ref Range   Glucose-Capillary 240 (H) 70 - 99 mg/dL    Comment: Glucose reference range applies only to samples taken after fasting for at least 8 hours.  Glucose, capillary     Status: Abnormal   Collection Time: 06/19/22  9:06 PM  Result Value Ref Range   Glucose-Capillary 213 (H) 70 - 99 mg/dL    Comment: Glucose reference range applies only to samples taken after fasting for at least 8 hours.  Glucose, capillary     Status: Abnormal   Collection Time: 06/20/22  7:56 AM  Result Value Ref Range   Glucose-Capillary 236 (H) 70 - 99 mg/dL    Comment: Glucose reference range applies only to samples taken after fasting for at least 8 hours.  Glucose, capillary     Status: Abnormal   Collection Time: 06/20/22 12:20 PM  Result Value Ref Range   Glucose-Capillary 430 (H) 70 - 99 mg/dL    Comment: Glucose reference range applies only to samples taken after fasting for at least 8 hours.  Basic metabolic panel     Status: Abnormal   Collection Time: 06/24/22 11:45 AM  Result Value Ref Range   Sodium 140 135 - 145 mmol/L   Potassium 3.5 3.5 - 5.1 mmol/L   Chloride 103 98 - 111 mmol/L   CO2 29 22 - 32 mmol/L   Glucose, Bld 180 (H) 70 - 99 mg/dL    Comment: Glucose reference range applies only to samples taken after fasting for at least 8 hours.   BUN 83 (H) 8 - 23 mg/dL   Creatinine, Ser 3.47 (H) 0.61 - 1.24 mg/dL   Calcium 8.8 (L) 8.9 - 10.3 mg/dL   GFR, Estimated 17 (L) >60 mL/min    Comment: (NOTE) Calculated using the CKD-EPI Creatinine Equation (2021)    Anion gap 8 5 - 15    Comment: Performed at [facility], [address] [place], [place] [address]    Assessment/Plan: Type 2 diabetes mellitus with renal manifestations ([place]) An underlying cause of his renal failure and blood glucose control important in reducing the progression of atherosclerotic disease. Also, involved in wound healing. On appropriate medications.   Hypertension An  underlying cause of his renal failure and blood pressure control important in reducing the progression of atherosclerotic disease. On appropriate oral medications.  Hyperlipemia lipid control important in reducing the progression of atherosclerotic disease. Continue statin therapy  Swelling of limb Symptoms are relatively well-controlled with compression socks and elevation.  Continue this regimen.  Chronic kidney disease (CKD), stage IV (severe) (HCC) A vein mapping and arterial study were done this week for evaluation.  His arterial studies were relatively normal in the right and left upper extremities.  He had adequate cephalic and basilic vein throughout in both upper extremities.  As he is nearing dialysis dependence with worsening renal function, placing a fistula preemptively is a reasonable option.  This will be planned in the near future at his convenience.  Given the findings on his noninvasive studies, a left radiocephalic AV fistula will be planned.  Risks and benefits were discussed.      Leotis Pain 08/23/2022, 10:38 AM   This note was created with Dragon medical transcription system.  Any errors from dictation are unintentional.

## 2022-08-23 NOTE — Progress Notes (Signed)
  Patient ID: James Clarke, male   DOB: 10/09/1940, 82 y.o.   MRN: [id number]  Chief Complaint  Patient presents with   Follow-up    HPI James Clarke is a 82 y.o. male.  I am asked to see the patient by Dr. Lateef for evaluation of dialysis access placement.  His most recent GFR was 19.  He did have an episode last year where he had acute renal failure on top of his chronic kidney disease but he was able to come off dialysis during that hospitalization.  He has never had a fistula or graft placed.  He is right-hand dominant.  A vein mapping and arterial study were done this week for evaluation.  His arterial studies were relatively normal in the right and left upper extremities.  He had adequate cephalic and basilic vein throughout in both upper extremities.   We also follow him for leg swelling.  This has been well-controlled with compression socks and elevation as well as increasing activity.  He has mild swelling today but nothing severe.   Past Medical History:  Diagnosis Date   Asthma    Bell's palsy    SEVERAL TIMES   Bell's palsy    Cancer (HCC)    SKIN   CHF (congestive heart failure) (HCC)    Diabetes mellitus without complication (HCC)    Dyspnea    DOE   History of kidney stones    Hyperlipemia    Hypertension    Kidney stone    Neuropathy    Obesity    Sleep apnea    C PAP    Past Surgical History:  Procedure Laterality Date   APPENDECTOMY     CATARACT EXTRACTION W/PHACO Left 05/20/2017   Procedure: CATARACT EXTRACTION PHACO AND INTRAOCULAR LENS PLACEMENT (IOC);  Surgeon: Porfilio, William, MD;  Location: [facility];  Service: Ophthalmology;  Laterality: Left;  US 00:32.0 AP% 15.5 CDE 4.97 Fluid Pack Lot # [id number]   CATARACT EXTRACTION W/PHACO Right 06/10/2017   Procedure: CATARACT EXTRACTION PHACO AND INTRAOCULAR LENS PLACEMENT (IOC);  Surgeon: Porfilio, William, MD;  Location: [facility];  Service: Ophthalmology;  Laterality: Right;  US  00:51 AP%  15.4 CDE 7.95 Fluid pack lot # [id number]   CHOLECYSTECTOMY     CYSTOSCOPY W/ RETROGRADES Right 08/13/2019   Procedure: CYSTOSCOPY WITH RETROGRADE PYELOGRAM;  Surgeon: Sninsky, Brian C, MD;  Location: [facility];  Service: Urology;  Laterality: Right;   CYSTOSCOPY WITH STENT PLACEMENT Right 07/26/2019   Procedure: CYSTOSCOPY WITH STENT PLACEMENT;  Surgeon: Bell, Eugene D III, MD;  Location: [facility];  Service: Urology;  Laterality: Right;   CYSTOSCOPY/URETEROSCOPY/HOLMIUM LASER/STENT PLACEMENT Right 08/13/2019   Procedure: CYSTOSCOPY/URETEROSCOPY/HOLMIUM LASER/STENT EXCHANGE;  Surgeon: Sninsky, Brian C, MD;  Location: [facility];  Service: Urology;  Laterality: Right;   CYSTOSCOPY/URETEROSCOPY/HOLMIUM LASER/STENT PLACEMENT Left 02/27/2022   Procedure: CYSTOSCOPY/URETEROSCOPY/HOLMIUM LASER/STENT PLACEMENT;  Surgeon: Stoioff, Scott C, MD;  Location: [facility];  Service: Urology;  Laterality: Left;   FRACTURE SURGERY     WRIST   HAND SURGERY     HERNIA REPAIR     LEFT HEART CATH AND CORONARY ANGIOGRAPHY N/A 12/31/2021   Procedure: LEFT HEART CATH AND CORONARY ANGIOGRAPHY;  Surgeon: Kowalski, Bruce J, MD;  Location: [facility];  Service: Cardiovascular;  Laterality: N/A;   SHOULDER ARTHROSCOPY WITH SUBACROMIAL DECOMPRESSION AND OPEN ROTATOR C Right 09/11/2020   Procedure: Right shoulder arthroscopic rotator cuff repair vs Regeneten patch application, subacromial decompression, and biceps tenodesis -   Todd Mundy to Assist;  Surgeon: Patel, Sunny, MD;  Location: [facility];  Service: Orthopedics;  Laterality: Right;   TEMPORARY DIALYSIS CATHETER N/A 03/05/2022   Procedure: TEMPORARY DIALYSIS CATHETER;  Surgeon: Schnier, Gregory G, MD;  Location: [facility];  Service: Cardiovascular;  Laterality: N/A;   TONSILLECTOMY     URETEROSCOPY WITH HOLMIUM LASER LITHOTRIPSY Left 02/27/2022   Procedure: URETEROSCOPY WITH HOLMIUM LASER LITHOTRIPSY;  Surgeon: Stoioff, Scott C, MD;  Location: [facility];  Service:  Urology;  Laterality: Left;     Family History  Problem Relation Age of Onset   Emphysema Mother    COPD Mother    Heart disease Mother    Brain cancer Father      Social History   Tobacco Use   Smoking status: Never    Passive exposure: Never   Smokeless tobacco: Never  Vaping Use   Vaping Use: Never used  Substance Use Topics   Alcohol use: No   Drug use: Not Currently     Allergies  Allergen Reactions   Codeine Nausea And Vomiting   Doxycycline    Erythromycin Rash    Current Outpatient Medications  Medication Sig Dispense Refill   AMLODIPINE BESYLATE PO Take 5 mg by mouth daily at 6 (six) AM.     aspirin EC 81 MG tablet Take by mouth.     calcitRIOL (ROCALTROL) 0.25 MCG capsule Take 0.25 mcg by mouth daily.     Continuous Blood Gluc Sensor (FREESTYLE LIBRE 14 DAY SENSOR) MISC by Does not apply route.     furosemide (LASIX) 40 MG tablet Take 80 mg q am and 40 mg qpm 60 tablet 2   hydrALAZINE (APRESOLINE) 50 MG tablet Take 50 mg by mouth 3 (three) times daily.     insulin aspart (NOVOLOG) 100 UNIT/ML injection Inject 30 units before breakfast and lunch. Inject 55 units before supper.     insulin detemir (LEVEMIR) 100 UNIT/ML injection Inject 0.5 mLs (50 Units total) into the skin at bedtime. 10 mL 1   isosorbide mononitrate (IMDUR) 30 MG 24 hr tablet Take 1 tablet (30 mg total) by mouth daily. 30 tablet 2   lovastatin (MEVACOR) 40 MG tablet Take 40 mg by mouth daily with supper.     metoprolol succinate (TOPROL-XL) 50 MG 24 hr tablet Take 50 mg by mouth daily.     potassium citrate (UROCIT-K) 10 MEQ (1080 MG) SR tablet Take 10 mEq by mouth in the morning and at bedtime.     tamsulosin (FLOMAX) 0.4 MG CAPS capsule TAKE (1) CAPSULE BY MOUTH EVERY DAY 30 capsule 1   acetaminophen (TYLENOL) 500 MG tablet Take 1,000 mg by mouth every 6 (six) hours as needed (shoulder pain).     albuterol (PROVENTIL HFA;VENTOLIN HFA) 108 (90 Base) MCG/ACT inhaler Inhale 2 puffs into the  lungs every 6 (six) hours as needed for wheezing or shortness of breath.     cetirizine (ZYRTEC) 10 MG chewable tablet Chew 10 mg by mouth daily as needed for allergies.     fluticasone (FLONASE) 50 MCG/ACT nasal spray as needed.     GLOBAL INJECT EASE INSULIN SYR 31G X 5/16" 0.5 ML MISC Inject into the skin.     glucose blood (ONETOUCH ULTRA) test strip TEST BLOOD SUGAR 4 TIMES DAILY     Lancets (ONETOUCH DELICA PLUS LANCET30G) MISC USE 1 LANCET 4 TIMES DAILY     nitroGLYCERIN (NITROSTAT) 0.4 MG SL tablet Place 1 tablet (0.4 mg total)   under the tongue every 5 (five) minutes as needed for chest pain. 20 tablet 1   traMADol (ULTRAM) 50 MG tablet Take 50 mg by mouth every 12 (twelve) hours as needed.     No current facility-administered medications for this visit.      REVIEW OF SYSTEMS (Negative unless checked)  Constitutional: []Weight loss  []Fever  []Chills Cardiac: []Chest pain   []Chest pressure   []Palpitations   []Shortness of breath when laying flat   []Shortness of breath at rest   [x]Shortness of breath with exertion. Vascular:  []Pain in legs with walking   []Pain in legs at rest   []Pain in legs when laying flat   []Claudication   []Pain in feet when walking  []Pain in feet at rest  []Pain in feet when laying flat   []History of DVT   []Phlebitis   [x]Swelling in legs   []Varicose veins   []Non-healing ulcers Pulmonary:   []Uses home oxygen   []Productive cough   []Hemoptysis   []Wheeze  []COPD   []Asthma Neurologic:  []Dizziness  []Blackouts   []Seizures   []History of stroke   []History of TIA  []Aphasia   []Temporary blindness   []Dysphagia   []Weakness or numbness in arms   []Weakness or numbness in legs Musculoskeletal:  [x]Arthritis   []Joint swelling   [x]Joint pain   []Low back pain Hematologic:  []Easy bruising  []Easy bleeding   []Hypercoagulable state   [x]Anemic  []Hepatitis Gastrointestinal:  []Blood in stool   []Vomiting blood  []Gastroesophageal reflux/heartburn    []Abdominal pain Genitourinary:  [x]Chronic kidney disease   []Difficult urination  []Frequent urination  []Burning with urination   []Hematuria Skin:  []Rashes   []Ulcers   []Wounds Psychological:  []History of anxiety   [] History of major depression.    Physical Exam BP (!) 148/71   Pulse 69   Ht 5' 10" (1.778 m)   Wt (!) 309 lb (140.2 kg)   BMI 44.34 kg/m  Gen:  WD/WN, NAD Head: /AT, No temporalis wasting.  Ear/Nose/Throat: Hearing grossly intact, nares w/o erythema or drainage, oropharynx w/o Erythema/Exudate Eyes: Conjunctiva clear, sclera non-icteric  Neck: trachea midline.  No JVD.  Pulmonary:  Good air movement, respirations not labored, no use of accessory muscles  Cardiac: RRR, no JVD Vascular:  Vessel Right Left  Radial Palpable Palpable                          DP 1+ 2+  PT 1+ 1+   Gastrointestinal:. No masses, surgical incisions, or scars. Musculoskeletal: M/S 5/5 throughout.  Extremities without ischemic changes.  No deformity or atrophy. 1+ RLE edema, trace LLE edema. Neurologic: Sensation grossly intact in extremities.  Symmetrical.  Speech is fluent. Motor exam as listed above. Psychiatric: Judgment intact, Mood & affect appropriate for pt's clinical situation. Dermatologic: No rashes or ulcers noted.  No cellulitis or open wounds.    Radiology VAS US UPPER EXT VEIN MAPPING (PRE-OP AVF)  Result Date: 08/23/2022 UPPER EXTREMITY VEIN MAPPING Patient Name:  Antavious GENE Lyles  Date of Exam:   08/19/2022 Medical Rec #: [id number]        Accession #:    [id number] Date of Birth: 10/14/1940       Patient Gender: M Patient Age:   81 years Exam Location:  [facility] Procedure:      VAS US UPPER EXT VEIN MAPPING (PRE-OP AVF) Referring Phys:   GREGORY SCHNIER --------------------------------------------------------------------------------  Indications: Pre-access. History: CKD4.  Performing Technologist: Terry Knight RVT  Examination Guidelines: A complete  evaluation includes B-mode imaging, spectral Doppler, color Doppler, and power Doppler as needed of all accessible portions of each vessel. Bilateral testing is considered an integral part of a complete examination. Limited examinations for reoccurring indications may be performed as noted. +-----------------+-------------+----------+--------+ Right Cephalic   Diameter (cm)Depth (cm)Findings +-----------------+-------------+----------+--------+ Prox upper arm       0.44                        +-----------------+-------------+----------+--------+ Mid upper arm        0.57                        +-----------------+-------------+----------+--------+ Dist upper arm       0.54                        +-----------------+-------------+----------+--------+ Antecubital fossa    0.49                        +-----------------+-------------+----------+--------+ Prox forearm         0.55                        +-----------------+-------------+----------+--------+ Mid forearm          0.30                        +-----------------+-------------+----------+--------+ Dist forearm         0.23                        +-----------------+-------------+----------+--------+ +-----------------+-------------+----------+--------+ Right Basilic    Diameter (cm)Depth (cm)Findings +-----------------+-------------+----------+--------+ Prox upper arm       0.32                        +-----------------+-------------+----------+--------+ Mid upper arm        0.52                        +-----------------+-------------+----------+--------+ Dist upper arm       0.48                        +-----------------+-------------+----------+--------+ Antecubital fossa    0.44                        +-----------------+-------------+----------+--------+ Prox forearm         0.40                        +-----------------+-------------+----------+--------+  +-----------------+-------------+----------+---------+ Left Cephalic    Diameter (cm)Depth (cm)Findings  +-----------------+-------------+----------+---------+ Prox upper arm       0.34                         +-----------------+-------------+----------+---------+ Mid upper arm        0.28                         +-----------------+-------------+----------+---------+ Dist upper arm       0.34                         +-----------------+-------------+----------+---------+   Antecubital fossa    0.34                         +-----------------+-------------+----------+---------+ Prox forearm         0.42               branching +-----------------+-------------+----------+---------+ Mid forearm          0.25               branching +-----------------+-------------+----------+---------+ Dist forearm         0.34                         +-----------------+-------------+----------+---------+ +-----------------+-------------+----------+--------+ Left Basilic     Diameter (cm)Depth (cm)Findings +-----------------+-------------+----------+--------+ Prox upper arm       0.45                        +-----------------+-------------+----------+--------+ Mid upper arm        0.44                        +-----------------+-------------+----------+--------+ Dist upper arm       0.34                        +-----------------+-------------+----------+--------+ Antecubital fossa    0.53                        +-----------------+-------------+----------+--------+ Prox forearm         0.42                        +-----------------+-------------+----------+--------+ Summary: Right: NL compressible veins with appropriate diameters. Left: NL compressible veins with appropriate diameters. *See table(s) above for measurements and observations.  Diagnosing physician: Maxemiliano Riel MD Electronically signed by Connar Keating MD on 08/23/2022 at 9:50:45 AM.    Final    VAS US UPPER  EXTREMITY ARTERIAL DUPLEX  Result Date: 08/23/2022  UPPER EXTREMITY DUPLEX STUDY Patient Name:  Lomax GENE Touchette  Date of Exam:   08/19/2022 Medical Rec #: [id number]        Accession #:    [id number] Date of Birth: 04/02/1941       Patient Gender: M Patient Age:   81 years Exam Location:  [facility] Procedure:      VAS US UPPER EXTREMITY ARTERIAL DUPLEX Referring Phys: GREGORY SCHNIER --------------------------------------------------------------------------------  Limitations: CKD4 Performing Technologist: Terry Knight RVT  Examination Guidelines: A complete evaluation includes B-mode imaging, spectral Doppler, color Doppler, and power Doppler as needed of all accessible portions of each vessel. Bilateral testing is considered an integral part of a complete examination. Limited examinations for reoccurring indications may be performed as noted.  Right Pre-Dialysis Findings: +-----------------------+----------+--------------------+---------+--------+ Location               PSV (cm/s)Intralum. Diam. (cm)Waveform Comments +-----------------------+----------+--------------------+---------+--------+ Brachial Antecub. fossa82        0.52                triphasic         +-----------------------+----------+--------------------+---------+--------+ Radial Art at Wrist    89        0.32                triphasic         +-----------------------+----------+--------------------+---------+--------+ Ulnar Art at Wrist       84        0.31                triphasic         +-----------------------+----------+--------------------+---------+--------+  Left Pre-Dialysis Findings: +-----------------------+----------+--------------------+---------+--------+ Location               PSV (cm/s)Intralum. Diam. (cm)Waveform Comments +-----------------------+----------+--------------------+---------+--------+ Brachial Antecub. fossa103       0.52                triphasic          +-----------------------+----------+--------------------+---------+--------+ Radial Art at Wrist    88        0.32                biphasic          +-----------------------+----------+--------------------+---------+--------+ Ulnar Art at Wrist     79        0.28                biphasic          +-----------------------+----------+--------------------+---------+--------+  Summary:  Right: No obstruction visualized in the right upper extremity        Minimal atherosclerosis. Left: No obstruction visualized in the left upper extremity Mild       atherosclerosis. *See table(s) above for measurements and observations. Electronically signed by Nike Southers MD on 08/23/2022 at 9:50:27 AM.    Final     Labs Recent Results (from the past 2160 hour(s))  Basic metabolic panel     Status: Abnormal   Collection Time: 06/12/22 12:59 PM  Result Value Ref Range   Sodium 140 135 - 145 mmol/L   Potassium 4.3 3.5 - 5.1 mmol/L   Chloride 108 98 - 111 mmol/L   CO2 25 22 - 32 mmol/L   Glucose, Bld 184 (H) 70 - 99 mg/dL    Comment: Glucose reference range applies only to samples taken after fasting for at least 8 hours.   BUN 56 (H) 8 - 23 mg/dL   Creatinine, Ser 2.90 (H) 0.61 - 1.24 mg/dL   Calcium 8.9 8.9 - 10.3 mg/dL   GFR, Estimated 21 (L) >60 mL/min    Comment: (NOTE) Calculated using the CKD-EPI Creatinine Equation (2021)    Anion gap 7 5 - 15    Comment: Performed at [facility], [address] [place], [place] [address]  CBC     Status: Abnormal   Collection Time: 06/12/22 12:59 PM  Result Value Ref Range   WBC 11.8 (H) 4.0 - 10.5 K/uL   RBC 4.09 (L) 4.22 - 5.81 MIL/uL   Hemoglobin 11.7 (L) 13.0 - 17.0 g/dL   HCT 36.7 (L) 39.0 - 52.0 %   MCV 89.7 80.0 - 100.0 fL   MCH 28.6 26.0 - 34.0 pg   MCHC 31.9 30.0 - 36.0 g/dL   RDW 13.8 11.5 - 15.5 %   Platelets 307 150 - 400 K/uL   nRBC 0.0 0.0 - 0.2 %    Comment: Performed at [facility], [address] [place], [place]  [address]  Troponin I (High Sensitivity)     Status: Abnormal   Collection Time: 06/12/22 12:59 PM  Result Value Ref Range   Troponin I (High Sensitivity) 61 (H) <18 ng/L    Comment: (NOTE) Elevated high sensitivity troponin I (hsTnI) values and significant  changes across serial measurements may suggest ACS but many other  chronic and acute conditions are known to elevate hsTnI results.  Refer   to the "Links" section for chest pain algorithms and additional  guidance. Performed at [facility], [address] [place], [place] [address]   Brain natriuretic peptide     Status: Abnormal   Collection Time: 06/12/22 12:59 PM  Result Value Ref Range   B Natriuretic Peptide 415.8 (H) 0.0 - 100.0 pg/mL    Comment: Performed at [facility], [address] [place], [place] [address]  Resp Panel by RT-PCR (Flu A&B, Covid) Anterior Nasal Swab     Status: None   Collection Time: 06/12/22  2:20 PM   Specimen: Anterior Nasal Swab  Result Value Ref Range   SARS Coronavirus 2 by RT PCR NEGATIVE NEGATIVE    Comment: (NOTE) SARS-CoV-2 target nucleic acids are NOT DETECTED.  The SARS-CoV-2 RNA is generally detectable in upper respiratory specimens during the acute phase of infection. The lowest concentration of SARS-CoV-2 viral copies this assay can detect is 138 copies/mL. A negative result does not preclude SARS-Cov-2 infection and should not be used as the sole basis for treatment or other patient management decisions. A negative result may occur with  improper specimen collection/handling, submission of specimen other than nasopharyngeal swab, presence of viral mutation(s) within the areas targeted by this assay, and inadequate number of viral copies(<138 copies/mL). A negative result must be combined with clinical observations, patient history, and epidemiological information. The expected result is Negative.  Fact Sheet for Patients:   https://www.fda.gov/media/152166/download  Fact Sheet for Healthcare Providers:  https://www.fda.gov/media/152162/download  This test is no t yet approved or cleared by the [place] FDA and  has been authorized for detection and/or diagnosis of SARS-CoV-2 by FDA under an Emergency Use Authorization (EUA). This EUA will remain  in effect (meaning this test can be used) for the duration of the COVID-19 declaration under Section 564(b)(1) of the Act, 21 U.S.C.section 360bbb-3(b)(1), unless the authorization is terminated  or revoked sooner.       Influenza A by PCR NEGATIVE NEGATIVE   Influenza B by PCR NEGATIVE NEGATIVE    Comment: (NOTE) The Xpert Xpress SARS-CoV-2/FLU/RSV plus assay is intended as an aid in the diagnosis of influenza from Nasopharyngeal swab specimens and should not be used as a sole basis for treatment. Nasal washings and aspirates are unacceptable for Xpert Xpress SARS-CoV-2/FLU/RSV testing.  Fact Sheet for Patients: https://www.fda.gov/media/152166/download  Fact Sheet for Healthcare Providers: https://www.fda.gov/media/152162/download  This test is not yet approved or cleared by the [place] FDA and has been authorized for detection and/or diagnosis of SARS-CoV-2 by FDA under an Emergency Use Authorization (EUA). This EUA will remain in effect (meaning this test can be used) for the duration of the COVID-19 declaration under Section 564(b)(1) of the Act, 21 U.S.C. section 360bbb-3(b)(1), unless the authorization is terminated or revoked.  Performed at [facility], [address] [place], [place] [address]   Troponin I (High Sensitivity)     Status: Abnormal   Collection Time: 06/12/22  5:06 PM  Result Value Ref Range   Troponin I (High Sensitivity) 62 (H) <18 ng/L    Comment: (NOTE) Elevated high sensitivity troponin I (hsTnI) values and significant  changes across serial measurements may suggest ACS but many other  chronic  and acute conditions are known to elevate hsTnI results.  Refer to the "Links" section for chest pain algorithms and additional  guidance. Performed at [facility], [address] [place], [place] [address]   CBG monitoring, ED     Status: Abnormal     Collection Time: 06/12/22  5:18 PM  Result Value Ref Range   Glucose-Capillary 194 (H) 70 - 99 mg/dL    Comment: Glucose reference range applies only to samples taken after fasting for at least 8 hours.  Glucose, capillary     Status: Abnormal   Collection Time: 06/12/22  8:18 PM  Result Value Ref Range   Glucose-Capillary 167 (H) 70 - 99 mg/dL    Comment: Glucose reference range applies only to samples taken after fasting for at least 8 hours.  Troponin I (High Sensitivity)     Status: Abnormal   Collection Time: 06/12/22  8:19 PM  Result Value Ref Range   Troponin I (High Sensitivity) 75 (H) <18 ng/L    Comment: (NOTE) Elevated high sensitivity troponin I (hsTnI) values and significant  changes across serial measurements may suggest ACS but many other  chronic and acute conditions are known to elevate hsTnI results.  Refer to the "Links" section for chest pain algorithms and additional  guidance. Performed at [facility], [address] [place], [place] [address]   Glucose, capillary     Status: Abnormal   Collection Time: 06/13/22 12:21 AM  Result Value Ref Range   Glucose-Capillary 148 (H) 70 - 99 mg/dL    Comment: Glucose reference range applies only to samples taken after fasting for at least 8 hours.  Troponin I (High Sensitivity)     Status: Abnormal   Collection Time: 06/13/22 12:25 AM  Result Value Ref Range   Troponin I (High Sensitivity) 82 (H) <18 ng/L    Comment: (NOTE) Elevated high sensitivity troponin I (hsTnI) values and significant  changes across serial measurements may suggest ACS but many other  chronic and acute conditions are known to elevate hsTnI results.  Refer to the "Links"  section for chest pain algorithms and additional  guidance. Performed at [facility], [address] [place], [place] [address]   Magnesium     Status: None   Collection Time: 06/13/22 12:25 AM  Result Value Ref Range   Magnesium 2.1 1.7 - 2.4 mg/dL    Comment: Performed at [facility], [address] [place], [place] [address]  CBC     Status: Abnormal   Collection Time: 06/13/22  2:40 AM  Result Value Ref Range   WBC 10.8 (H) 4.0 - 10.5 K/uL   RBC 3.75 (L) 4.22 - 5.81 MIL/uL   Hemoglobin 10.7 (L) 13.0 - 17.0 g/dL   HCT 33.4 (L) 39.0 - 52.0 %   MCV 89.1 80.0 - 100.0 fL   MCH 28.5 26.0 - 34.0 pg   MCHC 32.0 30.0 - 36.0 g/dL   RDW 13.8 11.5 - 15.5 %   Platelets 265 150 - 400 K/uL   nRBC 0.0 0.0 - 0.2 %    Comment: Performed at [facility], [address] , [place] [address]  Basic metabolic panel     Status: Abnormal   Collection Time: 06/13/22  2:40 AM  Result Value Ref Range   Sodium 140 135 - 145 mmol/L   Potassium 3.9 3.5 - 5.1 mmol/L   Chloride 107 98 - 111 mmol/L   CO2 27 22 - 32 mmol/L   Glucose, Bld 178 (H) 70 - 99 mg/dL    Comment: Glucose reference range applies only to samples taken after fasting for at least 8 hours.   BUN 57 (H) 8 - 23 mg/dL   Creatinine, Ser 3.02 (H) 0.61 - 1.24 mg/dL     Calcium 8.5 (L) 8.9 - 10.3 mg/dL   GFR, Estimated 20 (L) >60 mL/min    Comment: (NOTE) Calculated using the CKD-EPI Creatinine Equation (2021)    Anion gap 6 5 - 15    Comment: Performed at [facility], [address] [place], [place] [address]  Troponin I (High Sensitivity)     Status: Abnormal   Collection Time: 06/13/22  2:40 AM  Result Value Ref Range   Troponin I (High Sensitivity) 75 (H) <18 ng/L    Comment: (NOTE) Elevated high sensitivity troponin I (hsTnI) values and significant  changes across serial measurements may suggest ACS but many other  chronic and acute conditions are known to elevate hsTnI results.   Refer to the "Links" section for chest pain algorithms and additional  guidance. Performed at [facility], [address] [place], [place] [address]   Glucose, capillary     Status: Abnormal   Collection Time: 06/13/22  7:18 AM  Result Value Ref Range   Glucose-Capillary 232 (H) 70 - 99 mg/dL    Comment: Glucose reference range applies only to samples taken after fasting for at least 8 hours.  Glucose, capillary     Status: Abnormal   Collection Time: 06/13/22 11:38 AM  Result Value Ref Range   Glucose-Capillary 289 (H) 70 - 99 mg/dL    Comment: Glucose reference range applies only to samples taken after fasting for at least 8 hours.  Glucose, capillary     Status: Abnormal   Collection Time: 06/13/22  4:43 PM  Result Value Ref Range   Glucose-Capillary 263 (H) 70 - 99 mg/dL    Comment: Glucose reference range applies only to samples taken after fasting for at least 8 hours.  Glucose, capillary     Status: Abnormal   Collection Time: 06/13/22  8:04 PM  Result Value Ref Range   Glucose-Capillary 287 (H) 70 - 99 mg/dL    Comment: Glucose reference range applies only to samples taken after fasting for at least 8 hours.  CBC     Status: Abnormal   Collection Time: 06/14/22  4:47 AM  Result Value Ref Range   WBC 10.9 (H) 4.0 - 10.5 K/uL   RBC 3.58 (L) 4.22 - 5.81 MIL/uL   Hemoglobin 10.2 (L) 13.0 - 17.0 g/dL   HCT 32.1 (L) 39.0 - 52.0 %   MCV 89.7 80.0 - 100.0 fL   MCH 28.5 26.0 - 34.0 pg   MCHC 31.8 30.0 - 36.0 g/dL   RDW 13.7 11.5 - 15.5 %   Platelets 269 150 - 400 K/uL   nRBC 0.0 0.0 - 0.2 %    Comment: Performed at [facility], [address] [place], [place] [address]  Basic metabolic panel     Status: Abnormal   Collection Time: 06/14/22  4:47 AM  Result Value Ref Range   Sodium 142 135 - 145 mmol/L   Potassium 3.8 3.5 - 5.1 mmol/L   Chloride 105 98 - 111 mmol/L   CO2 28 22 - 32 mmol/L   Glucose, Bld 193 (H) 70 - 99 mg/dL    Comment: Glucose  reference range applies only to samples taken after fasting for at least 8 hours.   BUN 67 (H) 8 - 23 mg/dL   Creatinine, Ser 3.41 (H) 0.61 - 1.24 mg/dL   Calcium 8.3 (L) 8.9 - 10.3 mg/dL   GFR, Estimated 17 (L) >60 mL/min    Comment: (NOTE) Calculated using the CKD-EPI   Creatinine Equation (2021)    Anion gap 9 5 - 15    Comment: Performed at [facility], [address] [place], [place] [address]  Glucose, capillary     Status: Abnormal   Collection Time: 06/14/22  8:16 AM  Result Value Ref Range   Glucose-Capillary 212 (H) 70 - 99 mg/dL    Comment: Glucose reference range applies only to samples taken after fasting for at least 8 hours.  Glucose, capillary     Status: Abnormal   Collection Time: 06/14/22 12:03 PM  Result Value Ref Range   Glucose-Capillary 260 (H) 70 - 99 mg/dL    Comment: Glucose reference range applies only to samples taken after fasting for at least 8 hours.  Glucose, capillary     Status: Abnormal   Collection Time: 06/14/22  4:40 PM  Result Value Ref Range   Glucose-Capillary 168 (H) 70 - 99 mg/dL    Comment: Glucose reference range applies only to samples taken after fasting for at least 8 hours.  Glucose, capillary     Status: Abnormal   Collection Time: 06/14/22  8:40 PM  Result Value Ref Range   Glucose-Capillary 269 (H) 70 - 99 mg/dL    Comment: Glucose reference range applies only to samples taken after fasting for at least 8 hours.  CBC     Status: Abnormal   Collection Time: 06/15/22  4:45 AM  Result Value Ref Range   WBC 9.5 4.0 - 10.5 K/uL   RBC 3.44 (L) 4.22 - 5.81 MIL/uL   Hemoglobin 9.9 (L) 13.0 - 17.0 g/dL   HCT 30.6 (L) 39.0 - 52.0 %   MCV 89.0 80.0 - 100.0 fL   MCH 28.8 26.0 - 34.0 pg   MCHC 32.4 30.0 - 36.0 g/dL   RDW 13.6 11.5 - 15.5 %   Platelets 271 150 - 400 K/uL   nRBC 0.0 0.0 - 0.2 %    Comment: Performed at [facility], [address] [place], [place] [address]  Basic metabolic panel     Status:  Abnormal   Collection Time: 06/15/22  4:45 AM  Result Value Ref Range   Sodium 139 135 - 145 mmol/L   Potassium 3.4 (L) 3.5 - 5.1 mmol/L   Chloride 102 98 - 111 mmol/L   CO2 26 22 - 32 mmol/L   Glucose, Bld 208 (H) 70 - 99 mg/dL    Comment: Glucose reference range applies only to samples taken after fasting for at least 8 hours.   BUN 70 (H) 8 - 23 mg/dL   Creatinine, Ser 3.55 (H) 0.61 - 1.24 mg/dL   Calcium 8.3 (L) 8.9 - 10.3 mg/dL   GFR, Estimated 17 (L) >60 mL/min    Comment: (NOTE) Calculated using the CKD-EPI Creatinine Equation (2021)    Anion gap 11 5 - 15    Comment: Performed at [facility], [address] [place], [place] [address]  Glucose, capillary     Status: Abnormal   Collection Time: 06/15/22  8:03 AM  Result Value Ref Range   Glucose-Capillary 134 (H) 70 - 99 mg/dL    Comment: Glucose reference range applies only to samples taken after fasting for at least 8 hours.  Glucose, capillary     Status: Abnormal   Collection Time: 06/15/22  1:16 PM  Result Value Ref Range   Glucose-Capillary 287 (H) 70 - 99 mg/dL    Comment: Glucose reference range applies only to samples taken after   fasting for at least 8 hours.  Glucose, capillary     Status: Abnormal   Collection Time: 06/15/22  4:43 PM  Result Value Ref Range   Glucose-Capillary 227 (H) 70 - 99 mg/dL    Comment: Glucose reference range applies only to samples taken after fasting for at least 8 hours.  Glucose, capillary     Status: Abnormal   Collection Time: 06/15/22  9:25 PM  Result Value Ref Range   Glucose-Capillary 153 (H) 70 - 99 mg/dL    Comment: Glucose reference range applies only to samples taken after fasting for at least 8 hours.  CBC     Status: Abnormal   Collection Time: 06/16/22  5:15 AM  Result Value Ref Range   WBC 9.7 4.0 - 10.5 K/uL   RBC 3.47 (L) 4.22 - 5.81 MIL/uL   Hemoglobin 10.0 (L) 13.0 - 17.0 g/dL   HCT 30.7 (L) 39.0 - 52.0 %   MCV 88.5 80.0 - 100.0 fL   MCH 28.8 26.0  - 34.0 pg   MCHC 32.6 30.0 - 36.0 g/dL   RDW 13.6 11.5 - 15.5 %   Platelets 274 150 - 400 K/uL   nRBC 0.0 0.0 - 0.2 %    Comment: Performed at [facility], [address] [place], [place] [address]  Basic metabolic panel     Status: Abnormal   Collection Time: 06/16/22  5:15 AM  Result Value Ref Range   Sodium 141 135 - 145 mmol/L   Potassium 3.4 (L) 3.5 - 5.1 mmol/L   Chloride 105 98 - 111 mmol/L   CO2 27 22 - 32 mmol/L   Glucose, Bld 197 (H) 70 - 99 mg/dL    Comment: Glucose reference range applies only to samples taken after fasting for at least 8 hours.   BUN 73 (H) 8 - 23 mg/dL   Creatinine, Ser 3.58 (H) 0.61 - 1.24 mg/dL   Calcium 8.3 (L) 8.9 - 10.3 mg/dL   GFR, Estimated 16 (L) >60 mL/min    Comment: (NOTE) Calculated using the CKD-EPI Creatinine Equation (2021)    Anion gap 9 5 - 15    Comment: Performed at [facility], [address] , [place] [address]  Glucose, capillary     Status: Abnormal   Collection Time: 06/16/22  8:45 AM  Result Value Ref Range   Glucose-Capillary 154 (H) 70 - 99 mg/dL    Comment: Glucose reference range applies only to samples taken after fasting for at least 8 hours.  Glucose, capillary     Status: Abnormal   Collection Time: 06/16/22 12:25 PM  Result Value Ref Range   Glucose-Capillary 238 (H) 70 - 99 mg/dL    Comment: Glucose reference range applies only to samples taken after fasting for at least 8 hours.  Glucose, capillary     Status: Abnormal   Collection Time: 06/16/22  3:29 PM  Result Value Ref Range   Glucose-Capillary 218 (H) 70 - 99 mg/dL    Comment: Glucose reference range applies only to samples taken after fasting for at least 8 hours.  Glucose, capillary     Status: Abnormal   Collection Time: 06/16/22  4:23 PM  Result Value Ref Range   Glucose-Capillary 210 (H) 70 - 99 mg/dL    Comment: Glucose reference range applies only to samples taken after fasting for at least 8 hours.  Glucose,  capillary     Status: Abnormal   Collection Time: 06/16/22    8:54 PM  Result Value Ref Range   Glucose-Capillary 243 (H) 70 - 99 mg/dL    Comment: Glucose reference range applies only to samples taken after fasting for at least 8 hours.  Basic metabolic panel     Status: Abnormal   Collection Time: 06/17/22  4:41 AM  Result Value Ref Range   Sodium 141 135 - 145 mmol/L   Potassium 3.4 (L) 3.5 - 5.1 mmol/L   Chloride 104 98 - 111 mmol/L   CO2 26 22 - 32 mmol/L   Glucose, Bld 207 (H) 70 - 99 mg/dL    Comment: Glucose reference range applies only to samples taken after fasting for at least 8 hours.   BUN 74 (H) 8 - 23 mg/dL   Creatinine, Ser 3.55 (H) 0.61 - 1.24 mg/dL   Calcium 8.4 (L) 8.9 - 10.3 mg/dL   GFR, Estimated 17 (L) >60 mL/min    Comment: (NOTE) Calculated using the CKD-EPI Creatinine Equation (2021)    Anion gap 11 5 - 15    Comment: Performed at [facility], [address] [place], [place] [address]  Glucose, capillary     Status: Abnormal   Collection Time: 06/17/22  8:04 AM  Result Value Ref Range   Glucose-Capillary 208 (H) 70 - 99 mg/dL    Comment: Glucose reference range applies only to samples taken after fasting for at least 8 hours.  Glucose, capillary     Status: Abnormal   Collection Time: 06/17/22 12:30 PM  Result Value Ref Range   Glucose-Capillary 256 (H) 70 - 99 mg/dL    Comment: Glucose reference range applies only to samples taken after fasting for at least 8 hours.  Glucose, capillary     Status: Abnormal   Collection Time: 06/17/22  3:12 PM  Result Value Ref Range   Glucose-Capillary 267 (H) 70 - 99 mg/dL    Comment: Glucose reference range applies only to samples taken after fasting for at least 8 hours.  Glucose, capillary     Status: Abnormal   Collection Time: 06/17/22 10:45 PM  Result Value Ref Range   Glucose-Capillary 143 (H) 70 - 99 mg/dL    Comment: Glucose reference range applies only to samples taken after fasting for at least  8 hours.  Ferritin     Status: None   Collection Time: 06/18/22  5:54 AM  Result Value Ref Range   Ferritin 149 24 - 336 ng/mL    Comment: Performed at [facility], [address] [place], [place] [address]  Hemoglobin     Status: Abnormal   Collection Time: 06/18/22  5:54 AM  Result Value Ref Range   Hemoglobin 10.0 (L) 13.0 - 17.0 g/dL    Comment: Performed at [facility], [address] [place], [place] [address]  Basic metabolic panel     Status: Abnormal   Collection Time: 06/18/22  5:54 AM  Result Value Ref Range   Sodium 141 135 - 145 mmol/L   Potassium 4.4 3.5 - 5.1 mmol/L   Chloride 105 98 - 111 mmol/L   CO2 26 22 - 32 mmol/L   Glucose, Bld 167 (H) 70 - 99 mg/dL    Comment: Glucose reference range applies only to samples taken after fasting for at least 8 hours.   BUN 72 (H) 8 - 23 mg/dL   Creatinine, Ser 3.71 (H) 0.61 - 1.24 mg/dL   Calcium 7.4 (L) 8.9 - 10.3 mg/dL   GFR, Estimated 16 (  L) >60 mL/min    Comment: (NOTE) Calculated using the CKD-EPI Creatinine Equation (2021)    Anion gap 10 5 - 15    Comment: Performed at [facility], [address] [place], [place] [address]  Glucose, capillary     Status: Abnormal   Collection Time: 06/18/22  8:13 AM  Result Value Ref Range   Glucose-Capillary 150 (H) 70 - 99 mg/dL    Comment: Glucose reference range applies only to samples taken after fasting for at least 8 hours.  Glucose, capillary     Status: Abnormal   Collection Time: 06/18/22 11:35 AM  Result Value Ref Range   Glucose-Capillary 269 (H) 70 - 99 mg/dL    Comment: Glucose reference range applies only to samples taken after fasting for at least 8 hours.  Glucose, capillary     Status: Abnormal   Collection Time: 06/18/22  4:39 PM  Result Value Ref Range   Glucose-Capillary 174 (H) 70 - 99 mg/dL    Comment: Glucose reference range applies only to samples taken after fasting for at least 8 hours.  Glucose, capillary      Status: Abnormal   Collection Time: 06/18/22  8:37 PM  Result Value Ref Range   Glucose-Capillary 167 (H) 70 - 99 mg/dL    Comment: Glucose reference range applies only to samples taken after fasting for at least 8 hours.  Hemoglobin A1c     Status: Abnormal   Collection Time: 06/19/22  7:08 AM  Result Value Ref Range   Hgb A1c MFr Bld 6.6 (H) 4.8 - 5.6 %    Comment: (NOTE)         Prediabetes: 5.7 - 6.4         Diabetes: >6.4         Glycemic control for adults with diabetes: <7.0    Mean Plasma Glucose 143 mg/dL    Comment: (NOTE) Performed At: [facility] [address] [place], [place] [id number] Nagendra Sanjai MD Ph:[phone]   Basic metabolic panel     Status: Abnormal   Collection Time: 06/19/22  7:14 AM  Result Value Ref Range   Sodium 142 135 - 145 mmol/L   Potassium 3.5 3.5 - 5.1 mmol/L   Chloride 105 98 - 111 mmol/L   CO2 28 22 - 32 mmol/L   Glucose, Bld 207 (H) 70 - 99 mg/dL    Comment: Glucose reference range applies only to samples taken after fasting for at least 8 hours.   BUN 74 (H) 8 - 23 mg/dL   Creatinine, Ser 3.80 (H) 0.61 - 1.24 mg/dL   Calcium 8.8 (L) 8.9 - 10.3 mg/dL   GFR, Estimated 15 (L) >60 mL/min    Comment: (NOTE) Calculated using the CKD-EPI Creatinine Equation (2021)    Anion gap 9 5 - 15    Comment: Performed at [facility], [address] [place],  [address]  Glucose, capillary     Status: Abnormal   Collection Time: 06/19/22  7:59 AM  Result Value Ref Range   Glucose-Capillary 222 (H) 70 - 99 mg/dL    Comment: Glucose reference range applies only to samples taken after fasting for at least 8 hours.  Glucose, capillary     Status: Abnormal   Collection Time: 06/19/22 11:58 AM  Result Value Ref Range   Glucose-Capillary 238 (H) 70 - 99 mg/dL    Comment: Glucose reference range applies only to samples taken after fasting for at least 8   hours.  Glucose, capillary     Status: Abnormal   Collection Time:  06/19/22  3:56 PM  Result Value Ref Range   Glucose-Capillary 240 (H) 70 - 99 mg/dL    Comment: Glucose reference range applies only to samples taken after fasting for at least 8 hours.  Glucose, capillary     Status: Abnormal   Collection Time: 06/19/22  9:06 PM  Result Value Ref Range   Glucose-Capillary 213 (H) 70 - 99 mg/dL    Comment: Glucose reference range applies only to samples taken after fasting for at least 8 hours.  Glucose, capillary     Status: Abnormal   Collection Time: 06/20/22  7:56 AM  Result Value Ref Range   Glucose-Capillary 236 (H) 70 - 99 mg/dL    Comment: Glucose reference range applies only to samples taken after fasting for at least 8 hours.  Glucose, capillary     Status: Abnormal   Collection Time: 06/20/22 12:20 PM  Result Value Ref Range   Glucose-Capillary 430 (H) 70 - 99 mg/dL    Comment: Glucose reference range applies only to samples taken after fasting for at least 8 hours.  Basic metabolic panel     Status: Abnormal   Collection Time: 06/24/22 11:45 AM  Result Value Ref Range   Sodium 140 135 - 145 mmol/L   Potassium 3.5 3.5 - 5.1 mmol/L   Chloride 103 98 - 111 mmol/L   CO2 29 22 - 32 mmol/L   Glucose, Bld 180 (H) 70 - 99 mg/dL    Comment: Glucose reference range applies only to samples taken after fasting for at least 8 hours.   BUN 83 (H) 8 - 23 mg/dL   Creatinine, Ser 3.47 (H) 0.61 - 1.24 mg/dL   Calcium 8.8 (L) 8.9 - 10.3 mg/dL   GFR, Estimated 17 (L) >60 mL/min    Comment: (NOTE) Calculated using the CKD-EPI Creatinine Equation (2021)    Anion gap 8 5 - 15    Comment: Performed at [facility], [address] [place], [place] [address]    Assessment/Plan: Type 2 diabetes mellitus with renal manifestations (HCC) An underlying cause of his renal failure and blood glucose control important in reducing the progression of atherosclerotic disease. Also, involved in wound healing. On appropriate medications.   Hypertension An  underlying cause of his renal failure and blood pressure control important in reducing the progression of atherosclerotic disease. On appropriate oral medications.  Hyperlipemia lipid control important in reducing the progression of atherosclerotic disease. Continue statin therapy  Swelling of limb Symptoms are relatively well-controlled with compression socks and elevation.  Continue this regimen.  Chronic kidney disease (CKD), stage IV (severe) (HCC) A vein mapping and arterial study were done this week for evaluation.  His arterial studies were relatively normal in the right and left upper extremities.  He had adequate cephalic and basilic vein throughout in both upper extremities.  As he is nearing dialysis dependence with worsening renal function, placing a fistula preemptively is a reasonable option.  This will be planned in the near future at his convenience.  Given the findings on his noninvasive studies, a left radiocephalic AV fistula will be planned.  Risks and benefits were discussed.      Statia Burdick 08/23/2022, 10:38 AM   This note was created with Dragon medical transcription system.  Any errors from dictation are unintentional.   

## 2022-08-23 NOTE — Assessment & Plan Note (Signed)
A vein mapping and arterial study were done this week for evaluation.  His arterial studies were relatively normal in the right and left upper extremities.  He had adequate cephalic and basilic vein throughout in both upper extremities.  As he is nearing dialysis dependence with worsening renal function, placing a fistula preemptively is a reasonable option.  This will be planned in the near future at his convenience.  Given the findings on his noninvasive studies, a left radiocephalic AV fistula will be planned.  Risks and benefits were discussed.

## 2022-08-23 NOTE — Assessment & Plan Note (Signed)
Symptoms are relatively well-controlled with compression socks and elevation.  Continue this regimen.

## 2022-08-23 NOTE — Patient Instructions (Signed)
AV Fistula Placement, Care After The following information offers guidance on how to care for yourself after your procedure. Your health care provider may also give you more specific instructions. If you have problems or questions, contact your health care provider. What can I expect after the procedure? After the procedure, it is common to have: Soreness at the fistula site. Vibration (thrill) over the fistula. Follow these instructions at home: Medicines Take over-the-counter and prescription medicines only as told by your health care provider. Ask your health care provider if the medicine prescribed to you can cause constipation. You may need to take these actions to prevent or treat constipation: Drink enough fluid to keep your urine pale yellow. Take over-the-counter or prescription medicines. Eat foods that are high in fiber, such as beans, whole grains, and fresh fruits and vegetables. Limit foods that are high in fat and processed sugars, such as fried or sweet foods. Incision care Follow instructions from your health care provider about how to take care of your incision. Make sure you: Wash your hands with soap and water for at least 20 seconds before and after you change your bandage (dressing). If soap and water are not available, use hand sanitizer. Change your dressing as told by your health care provider. Leave stitches (sutures), skin glue, or adhesive strips in place. These skin closures may need to stay in place for 2 weeks or longer. If adhesive strip edges start to loosen and curl up, you may trim the loose edges. Do not remove adhesive strips completely unless your health care provider tells you to do that.  Fistula care  Check your fistula site every day to make sure the thrill feels the same. Check your fistula site every day for signs of infection. Check for: More redness, swelling, or pain. Fluid or blood. Warmth. Pus or a bad smell. Raise (elevate) the affected  area above the level of your heart while you are sitting or lying down. Do not lift anything that is heavier than 10 lb (4.5 kg), or the limit that you are told, until your health care provider says that it is safe. Do not lie down on your fistula arm. Do not let anyone draw blood or take a blood pressure reading on your fistula arm. This is important. Do not wear tight jewelry or clothing over your fistula arm. Bathing Do not take baths, swim, or use a hot tub until your health care provider approves. Ask your health care provider if you may take showers. You may only be allowed to take sponge baths. Keep the area around your incision clean and dry. General instructions Rest at home for a day or two. If you were given a sedative during the procedure, it can affect you for several hours. Do not drive or operate machinery until your health care provider says that it is safe. Return to your normal activities as told. Ask your health care provider what activities are safe for you. Keep all follow-up visits. This is important. Contact a health care provider if: You have more redness, swelling, or pain around your fistula site. Your fistula site feels warm to the touch. You have pus or a bad smell coming from your fistula site. You have a fever or chills. You feel numb or cold in your arm or your fistula site. You feel a decrease or a change in the thrill over the fistula. Get help right away if: You have bleeding from your fistula site that will not stop.  You have chest pain. You have trouble breathing. These symptoms may represent a serious problem that is an emergency. Do not wait to see if the symptoms will go away. Get medical help right away. Call your local emergency services (911 in the [place] Do not drive yourself to the hospital. Summary Follow instructions from your health care provider about how to take care of your incision. Do not let anyone draw blood or take a blood pressure  reading on your fistula arm. This is important. Contact a health care provider if you have a change in the thrill or have any signs of infection at your fistula site. Keep all follow-up visits. This is important. This information is not intended to replace advice given to you by your health care provider. Make sure you discuss any questions you have with your health care provider. Document Revised: 02/16/2020 Document Reviewed: 02/16/2020 Elsevier Patient Education  [place].

## 2022-08-28 ENCOUNTER — Encounter: Payer: Self-pay | Admitting: Urology

## 2022-09-02 DIAGNOSIS — B351 Tinea unguium: Secondary | ICD-10-CM | POA: Diagnosis not present

## 2022-09-02 DIAGNOSIS — E1140 Type 2 diabetes mellitus with diabetic neuropathy, unspecified: Secondary | ICD-10-CM | POA: Diagnosis not present

## 2022-09-02 DIAGNOSIS — Z794 Long term (current) use of insulin: Secondary | ICD-10-CM | POA: Diagnosis not present

## 2022-09-05 ENCOUNTER — Encounter: Payer: Self-pay | Admitting: Family

## 2022-09-05 ENCOUNTER — Ambulatory Visit: Payer: PPO | Attending: Family | Admitting: Family

## 2022-09-05 VITALS — BP 167/72 | HR 73 | Resp 18 | Wt 314.0 lb

## 2022-09-05 DIAGNOSIS — I10 Essential (primary) hypertension: Secondary | ICD-10-CM | POA: Diagnosis not present

## 2022-09-05 DIAGNOSIS — E1122 Type 2 diabetes mellitus with diabetic chronic kidney disease: Secondary | ICD-10-CM | POA: Insufficient documentation

## 2022-09-05 DIAGNOSIS — I509 Heart failure, unspecified: Secondary | ICD-10-CM | POA: Diagnosis not present

## 2022-09-05 DIAGNOSIS — N189 Chronic kidney disease, unspecified: Secondary | ICD-10-CM | POA: Insufficient documentation

## 2022-09-05 DIAGNOSIS — G4730 Sleep apnea, unspecified: Secondary | ICD-10-CM | POA: Insufficient documentation

## 2022-09-05 DIAGNOSIS — J45909 Unspecified asthma, uncomplicated: Secondary | ICD-10-CM | POA: Insufficient documentation

## 2022-09-05 DIAGNOSIS — I5022 Chronic systolic (congestive) heart failure: Secondary | ICD-10-CM

## 2022-09-05 DIAGNOSIS — N184 Chronic kidney disease, stage 4 (severe): Secondary | ICD-10-CM

## 2022-09-05 DIAGNOSIS — Z794 Long term (current) use of insulin: Secondary | ICD-10-CM

## 2022-09-05 DIAGNOSIS — Z79899 Other long term (current) drug therapy: Secondary | ICD-10-CM | POA: Diagnosis not present

## 2022-09-05 DIAGNOSIS — E1140 Type 2 diabetes mellitus with diabetic neuropathy, unspecified: Secondary | ICD-10-CM | POA: Insufficient documentation

## 2022-09-05 DIAGNOSIS — I130 Hypertensive heart and chronic kidney disease with heart failure and stage 1 through stage 4 chronic kidney disease, or unspecified chronic kidney disease: Secondary | ICD-10-CM | POA: Insufficient documentation

## 2022-09-05 DIAGNOSIS — E785 Hyperlipidemia, unspecified: Secondary | ICD-10-CM | POA: Insufficient documentation

## 2022-09-05 NOTE — Progress Notes (Signed)
Patient ID: James Clarke, male    DOB: [birth year], 82 y.o.   MRN: [id number]  HPI  James Clarke is a 82 y/o male with a history of asthma, DM, hyperlipidemia, HTN, CKD, bell's palsy, sleep apnea and chronic heart failure.   Echo done 07/09/22 showed an EF of 45% along with moderate LVH. Echo report from 02/26/22 showed an EF of 55-60% along with mild LVH, LAE and trivial James.   LHC done 12/31/21 and showed:   Mid LAD lesion is 60% stenosed.   Prox RCA lesion is 20% stenosed.   Dist RCA lesion is 20% stenosed.   Ost Cx to Prox Cx lesion is 20% stenosed.   The left ventricular systolic function is normal.   LV end diastolic pressure is moderately elevated.   The left ventricular ejection fraction is greater than 65% by visual estimate.  Patient has significant elevated end-diastolic pressure  Admitted 06/12/22 due to worsening shortness of breath. Patient states that he has had shortness of breath for several days but worse 1 day prior to his admission. He states that he slept in his recliner with his CPAP because he could not breathe laying flat. Cardiology & nephrology consults obtained. Diuretic changed. PT/OT evaluations done. Discharged after 8 days.   He presents today for a follow-up visit with a chief complaint of moderate SOB with minimal exertion. Describes this as chronic in nature. Has associated fatigue, head congestion, pedal edema (improving) & dizziness (improving) along with this. Denies any difficulty sleeping, abdominal distention, palpitations, chest pain, cough or weight gain.   Amlodipine was decreased at last visit and he says that his dizziness is better. He notes that he tends to get dizzy upon getting out of the shower.   Past Medical History:  Diagnosis Date   Asthma    Bell's palsy    SEVERAL TIMES   Bell's palsy    Cancer (HCC)    SKIN   CHF (congestive heart failure) (HCC)    Diabetes mellitus without complication ([place])    Dyspnea    DOE   History of kidney  stones    Hyperlipemia    Hypertension    Kidney stone    Neuropathy    Obesity    Sleep apnea    C PAP   Past Surgical History:  Procedure Laterality Date   APPENDECTOMY     CATARACT EXTRACTION W/PHACO Left 05/20/2017   Procedure: CATARACT EXTRACTION PHACO AND INTRAOCULAR LENS PLACEMENT ([place]);  Surgeon: James Robson, MD;  Location: [facility];  Service: Ophthalmology;  Laterality: Left;  [place] 00:32.0 AP% 15.5 CDE 4.97 Fluid Pack Lot # [id number]   CATARACT EXTRACTION W/PHACO Right 06/10/2017   Procedure: CATARACT EXTRACTION PHACO AND INTRAOCULAR LENS PLACEMENT ([facility]);  Surgeon: James Robson, MD;  Location: [facility];  Service: Ophthalmology;  Laterality: Right;  [place]  00:51 AP% 15.4 CDE 7.95 Fluid pack lot # [id number]   CHOLECYSTECTOMY     CYSTOSCOPY W/ RETROGRADES Right 08/13/2019   Procedure: CYSTOSCOPY WITH RETROGRADE PYELOGRAM;  Surgeon: James Co, MD;  Location: [facility];  Service: Urology;  Laterality: Right;   CYSTOSCOPY WITH STENT PLACEMENT Right 07/26/2019   Procedure: CYSTOSCOPY WITH STENT PLACEMENT;  Surgeon: James Mallow, MD;  Location: [facility];  Service: Urology;  Laterality: Right;   CYSTOSCOPY/URETEROSCOPY/HOLMIUM LASER/STENT PLACEMENT Right 08/13/2019   Procedure: CYSTOSCOPY/URETEROSCOPY/HOLMIUM LASER/STENT EXCHANGE;  Surgeon: James Co, MD;  Location: [facility];  Service: Urology;  Laterality: Right;   CYSTOSCOPY/URETEROSCOPY/HOLMIUM LASER/STENT  PLACEMENT Left 02/27/2022   Procedure: CYSTOSCOPY/URETEROSCOPY/HOLMIUM LASER/STENT PLACEMENT;  Surgeon: James Sons, MD;  Location: [facility];  Service: Urology;  Laterality: Left;   FRACTURE SURGERY     WRIST   HAND SURGERY     HERNIA REPAIR     LEFT HEART CATH AND CORONARY ANGIOGRAPHY N/A 12/31/2021   Procedure: LEFT HEART CATH AND CORONARY ANGIOGRAPHY;  Surgeon: James Skains, MD;  Location: [facility];  Service: Cardiovascular;  Laterality: N/A;   SHOULDER ARTHROSCOPY WITH SUBACROMIAL  DECOMPRESSION AND OPEN ROTATOR C Right 09/11/2020   Procedure: Right shoulder arthroscopic rotator cuff repair vs Regeneten patch application, subacromial decompression, and biceps tenodesis - James Clarke to Assist;  Surgeon: James Fabry, MD;  Location: [facility];  Service: Orthopedics;  Laterality: Right;   TEMPORARY DIALYSIS CATHETER N/A 03/05/2022   Procedure: TEMPORARY DIALYSIS CATHETER;  Surgeon: James Cabal, MD;  Location: [facility];  Service: Cardiovascular;  Laterality: N/A;   TONSILLECTOMY     URETEROSCOPY WITH HOLMIUM LASER LITHOTRIPSY Left 02/27/2022   Procedure: URETEROSCOPY WITH HOLMIUM LASER LITHOTRIPSY;  Surgeon: James Sons, MD;  Location: [facility];  Service: Urology;  Laterality: Left;   Family History  Problem Relation Age of Onset   Emphysema Mother    COPD Mother    Heart disease Mother    Brain cancer Father    Social History   Tobacco Use   Smoking status: Never    Passive exposure: Never   Smokeless tobacco: Never  Substance Use Topics   Alcohol use: No   Allergies  Allergen Reactions   Codeine Nausea And Vomiting   Doxycycline    Erythromycin Rash   Prior to Admission medications   Medication Sig Start Date End Date Taking? Authorizing Provider  acetaminophen (TYLENOL) 500 MG tablet Take 1,000 mg by mouth every 6 (six) hours as needed (shoulder pain).   Yes [provider]  albuterol (PROVENTIL HFA;VENTOLIN HFA) 108 (90 Base) MCG/ACT inhaler Inhale 2 puffs into the lungs every 6 (six) hours as needed for wheezing or shortness of breath.   Yes [provider]  AMLODIPINE BESYLATE PO Take 5 mg by mouth daily at 6 (six) AM.   Yes [provider]  aspirin EC 81 MG tablet Take by mouth.   Yes [provider]  calcitRIOL (ROCALTROL) 0.25 MCG capsule Take 0.25 mcg by mouth daily. 07/13/19  Yes [provider]  cetirizine (ZYRTEC) 10 MG chewable tablet Chew 10 mg by mouth daily as needed for allergies.    Yes [provider]  fluticasone (FLONASE) 50 MCG/ACT nasal spray as needed. 03/17/17  Yes [provider]  furosemide (LASIX) 40 MG tablet Take 80 mg q am and 40 mg qpm 06/21/22  Yes James Mandes, MD  GLOBAL INJECT EASE INSULIN SYR 31G X 5/16" 0.5 ML MISC Inject into the skin. 08/16/21  Yes [provider]  glucose blood (ONETOUCH ULTRA) test strip TEST BLOOD SUGAR 4 TIMES DAILY 10/26/18  Yes [provider]  insulin aspart (NOVOLOG) 100 UNIT/ML injection Inject 30 units before breakfast and lunch. Inject 55 units before supper. 08/15/21  Yes [provider]  insulin detemir (LEVEMIR) 100 UNIT/ML injection Inject 0.5 mLs (50 Units total) into the skin at bedtime. 06/20/22  Yes James Mandes, MD  isosorbide mononitrate (IMDUR) 30 MG 24 hr tablet Take 1 tablet (30 mg total) by mouth daily. 06/21/22  Yes James Mandes, MD  Lancets (ONETOUCH DELICA PLUS Q000111Q) [place] USE  1 LANCET 4 TIMES DAILY 05/04/19  Yes [provider]  lovastatin (MEVACOR) 40 MG tablet Take 40 mg by mouth daily with supper. 04/28/17  Yes [provider]  metoprolol succinate (TOPROL-XL) 50 MG 24 hr tablet Take 50 mg by mouth daily. 09/01/21  Yes [provider]  potassium citrate (UROCIT-K) 10 MEQ (1080 MG) SR tablet Take 10 mEq by mouth in the morning and at bedtime.   Yes [provider]  tamsulosin (FLOMAX) 0.4 MG CAPS capsule TAKE (1) CAPSULE BY MOUTH EVERY DAY 06/03/22  Yes Stoioff, Ronda Fairly, MD  traMADol (ULTRAM) 50 MG tablet Take 50 mg by mouth every 12 (twelve) hours as needed. 04/05/21  Yes [provider]  Continuous Blood Gluc Sensor (FREESTYLE LIBRE 14 DAY SENSOR) MISC by Does not apply route.    [provider]  hydrALAZINE (APRESOLINE) 50 MG tablet Take 50 mg by mouth 3 (three) times daily. Patient not taking: Reported on 09/05/2022    [provider]  nitroGLYCERIN (NITROSTAT) 0.4 MG SL tablet Place 1 tablet (0.4 mg total)  under the tongue every 5 (five) minutes as needed for chest pain. Patient not taking: Reported on 09/05/2022 06/20/22   James Mandes, MD   Review of Systems  Constitutional:  Positive for fatigue. Negative for appetite change.  HENT:  Positive for congestion. Negative for nosebleeds and sore throat.   Eyes: Negative.   Respiratory:  Positive for shortness of breath (easily). Negative for cough and chest tightness.   Cardiovascular:  Positive for leg swelling. Negative for chest pain and palpitations.  Gastrointestinal:  Negative for abdominal distention and abdominal pain.  Endocrine: Negative.   Genitourinary: Negative.   Musculoskeletal:  Negative for back pain and neck pain.  Skin: Negative.   Allergic/Immunologic: Negative.   Neurological:  Positive for dizziness. Negative for light-headedness and headaches.  Hematological:  Negative for adenopathy. Does not bruise/bleed easily.  Psychiatric/Behavioral:  Negative for dysphoric mood and sleep disturbance (wearing CPAP). The patient is not nervous/anxious.    Vitals:   09/05/22 1307  BP: (!) 167/72  Pulse: 73  Resp: 18  SpO2: 97%  Weight: (!) 314 lb (142.4 kg)   Wt Readings from Last 3 Encounters:  09/05/22 (!) 314 lb (142.4 kg)  08/23/22 (!) 309 lb (140.2 kg)  08/05/22 (!) 315 lb (142.9 kg)   Lab Results  Component Value Date   CREATININE 3.47 (H) 06/24/2022   CREATININE 3.80 (H) 06/19/2022   CREATININE 3.71 (H) 06/18/2022   Physical Exam Vitals and nursing note reviewed. Exam conducted with a chaperone present (wife).  Constitutional:      Appearance: Normal appearance.  HENT:     Head: Normocephalic and atraumatic.  Cardiovascular:     Rate and Rhythm: Normal rate and regular rhythm.  Pulmonary:     Effort: Pulmonary effort is normal. No respiratory distress.     Breath sounds: No wheezing or rales.  Abdominal:     General: There is no distension.     Palpations: Abdomen is soft.     Tenderness: There is no  abdominal tenderness.  Musculoskeletal:        General: No tenderness.     Cervical back: Normal range of motion and neck supple.     Right lower leg: No tenderness. Edema (2+ pitting) present.     Left lower leg: No tenderness. Edema (1+ pitting) present.  Skin:    General: Skin is warm and dry.  Neurological:  General: No focal deficit present.     Mental Status: He is alert and oriented to person, place, and time.  Psychiatric:        Mood and Affect: Mood normal.        Behavior: Behavior normal.   Assessment & Plan:  1: Chronic heart failure with mildly reduced ejection fraction- - NYHA class III - euvolemic today - weighing daily & home weight ranges from 308-309 pounds; reminded to call for an overnight weight gain of > 2 pounds or a weekly weight gain of >5 pounds - weight stable from last visit here 1 month ago - furosemide 50m AM/ 424mPM - metoprolol succinate 5049maily - renal function limits other GDMT therapy - not adding salt and wife reads food labels for sodium content and understands to keep daily intake to <2000m68mdrinking 36 ounces of water, 1 glass of milk and a diet sprite daily - saw cardiology (SteAlbertine Patricia24/24 - wearing compression socks daily with removal at bedtime - BNP 06/12/22 was 415.8  - has received his flu & covid vaccines for this season  2: HTN- - BP 167/72; home BP log reviewed and SBP ranges from upper 90's-120's - amlodipine 5mg 41m- saw PCP (Feldpausch) 06/24/22 - BMP 08/20/22 reviewed and showed sodium 140, potassium 4.0, creatinine 3.35 & GFR 18  3: DM with CKD- - A1c 06/21/22 was 6.8% - saw nephrology (LateHolley Raring8/24 - saw endocrinology (Solum) 06/25/22 - saw vascular (Dew) 08/23/22 for evaluation of dialysis access placement.    Medication list reviewed.   Return in 6 months, sooner if needed.

## 2022-09-05 NOTE — Patient Instructions (Signed)
Continue weighing daily and call for an overnight weight gain of 3 pounds or more or a weekly weight gain of more than 5 pounds.   If you have voicemail, please make sure your mailbox is cleaned out so that we may leave a message and please make sure to listen to any voicemails.    If you receive a satisfaction survey regarding the [facility], please take the time to fill it out. This way we can continue to provide excellent care and make any changes that need to be made.   

## 2022-09-06 ENCOUNTER — Telehealth (INDEPENDENT_AMBULATORY_CARE_PROVIDER_SITE_OTHER): Payer: Self-pay

## 2022-09-06 ENCOUNTER — Other Ambulatory Visit: Payer: Self-pay | Admitting: Urology

## 2022-09-06 NOTE — Telephone Encounter (Signed)
Spoke with the patient and he is scheduled with Dr. Lucky Cowboy on 09/12/22 for a left radial cephalic AV fistula at the [facility]. Pre-op phone call is on 09/09/22 between 1-5 pm. Pre-surgical instructions were discussed and will be mailed.

## 2022-09-07 ENCOUNTER — Other Ambulatory Visit (INDEPENDENT_AMBULATORY_CARE_PROVIDER_SITE_OTHER): Payer: Self-pay | Admitting: Nurse Practitioner

## 2022-09-07 DIAGNOSIS — N184 Chronic kidney disease, stage 4 (severe): Secondary | ICD-10-CM

## 2022-09-09 ENCOUNTER — Other Ambulatory Visit: Payer: Self-pay

## 2022-09-09 ENCOUNTER — Encounter
Admission: RE | Admit: 2022-09-09 | Discharge: 2022-09-09 | Disposition: A | Discharge status: Home / Self Care | Payer: PPO | Source: Ambulatory Visit | Attending: Vascular Surgery | Admitting: Vascular Surgery

## 2022-09-09 VITALS — Ht 70.0 in | Wt 307.0 lb

## 2022-09-09 DIAGNOSIS — G4733 Obstructive sleep apnea (adult) (pediatric): Secondary | ICD-10-CM | POA: Diagnosis not present

## 2022-09-09 DIAGNOSIS — N184 Chronic kidney disease, stage 4 (severe): Secondary | ICD-10-CM

## 2022-09-09 DIAGNOSIS — J019 Acute sinusitis, unspecified: Secondary | ICD-10-CM | POA: Diagnosis not present

## 2022-09-09 NOTE — Patient Instructions (Addendum)
Your procedure is scheduled on: Thursday September 12, 2022. Report to the Registration Desk on the 1st floor of the [facility]. To find out your arrival time, please call [phone] between 1PM - 3PM on: Wednesday September 11, 2022. If your arrival time is 6:00 am, do not arrive before that time as the [place] entrance doors do not open until 6:00 am.  REMEMBER: Instructions that are not followed completely may result in serious medical risk, up to and including death; or upon the discretion of your surgeon and anesthesiologist your surgery may need to be rescheduled.  Do not eat food or drink fluids after midnight the night before surgery.  No gum chewing or hard candies.  One week prior to surgery: Stop Anti-inflammatories (NSAIDS) such as Advil, Aleve, Ibuprofen, Motrin, Naproxen, Naprosyn and Aspirin based products such as Excedrin, Goody's Powder, BC Powder. Stop ANY OVER THE COUNTER supplements until after surgery. You may however, continue to take Tylenol if needed for pain up until the day of surgery.  Continue taking all prescribed medications with the exception of the following:  Take 1/2 of your normal insulin dose insulin detemir (LEVEMIR) 100 UNIT/ML injection the night before and no insulin the morning of surgery.    TAKE ONLY THESE MEDICATIONS THE MORNING OF SURGERY WITH A SIP OF WATER:  AMLODIPINE BESYLATE  isosorbide mononitrate (IMDUR) 30 MG  metoprolol succinate (TOPROL-XL) 50 MG  tamsulosin (FLOMAX) 0.4 MG    Use inhalers on the day of surgery and bring to the hospital. albuterol (PROVENTIL HFA;VENTOLIN HFA) 108 (90 Base) MCG/ACT inhaler  fluticasone (FLONASE) 50 MCG/ACT nasal spray   No Alcohol for 24 hours before or after surgery.  No Smoking including e-cigarettes for 24 hours before surgery.  No chewable tobacco products for at least 6 hours before surgery.  No nicotine patches on the day of surgery.  Do not use any "recreational" drugs for at  least a week (preferably 2 weeks) before your surgery.  Please be advised that the combination of cocaine and anesthesia may have negative outcomes, up to and including death. If you test positive for cocaine, your surgery will be cancelled.  On the morning of surgery brush your teeth with toothpaste and water, you may rinse your mouth with mouthwash if you wish. Do not swallow any toothpaste or mouthwash.  Use CHG Soap or wipes as directed on instruction sheet.  Do not wear jewelry, make-up, hairpins, clips or nail polish.  Do not wear lotions, powders, or perfumes.   Do not shave body hair from the neck down 48 hours before surgery.  Contact lenses, hearing aids and dentures may not be worn into surgery.  Do not bring valuables to the hospital. [facility] is not responsible for any missing/lost belongings or valuables.   Total Shoulder Arthroplasty:  use Benzoyl Peroxide 5% Gel as directed on instruction sheet.  Bring your C-PAP to the hospital in case you may have to spend the night.   Notify your doctor if there is any change in your medical condition (cold, fever, infection).  Wear comfortable clothing (specific to your surgery type) to the hospital.  After surgery, you can help prevent lung complications by doing breathing exercises.  Take deep breaths and cough every 1-2 hours. Your doctor may order a device called an Incentive Spirometer to help you take deep breaths. When coughing or sneezing, hold a pillow firmly against your incision with both hands. This is called "splinting." Doing this helps protect your  incision. It also decreases belly discomfort.  If you are being admitted to the hospital overnight, leave your suitcase in the car. After surgery it may be brought to your room.  In case of increased patient census, it may be necessary for you, the patient, to continue your postoperative care in the Same Day Surgery department.  If you are being discharged the day of  surgery, you will not be allowed to drive home. You will need a responsible individual to drive you home and stay with you for 24 hours after surgery.   If you are taking public transportation, you will need to have a responsible individual with you.  Please call the [place] Dept. at [phone] if you have any questions about these instructions.  Surgery Visitation Policy:  Patients undergoing a surgery or procedure may have two family members or support persons with them as long as the person is not COVID-19 positive or experiencing its symptoms.   Inpatient Visitation:    Visiting hours are 7 a.m. to 8 p.m. Up to four visitors are allowed at one time in a patient room. The visitors may rotate out with other people during the day. One designated support person (adult) may remain overnight.  Due to an increase in RSV and influenza rates and associated hospitalizations, children ages 81 and under will not be able to visit patients in [facility]. Masks continue to be strongly recommended.    Preparing for Surgery with CHLORHEXIDINE GLUCONATE (CHG) Soap  Chlorhexidine Gluconate (CHG) Soap  o An antiseptic cleaner that kills germs and bonds with the skin to continue killing germs even after washing  o Used for showering the night before surgery and morning of surgery  Before surgery, you can play an important role by reducing the number of germs on your skin.  CHG (Chlorhexidine gluconate) soap is an antiseptic cleanser which kills germs and bonds with the skin to continue killing germs even after washing.  Please do not use if you have an allergy to CHG or antibacterial soaps. If your skin becomes reddened/irritated stop using the CHG.  1. Shower the NIGHT BEFORE SURGERY and the MORNING OF SURGERY with CHG soap.  2. If you choose to wash your hair, wash your hair first as usual with your normal shampoo.  3. After shampooing, rinse your hair and body  thoroughly to remove the shampoo.  4. Use CHG as you would any other liquid soap. You can apply CHG directly to the skin and wash gently with a scrungie or a clean washcloth.  5. Apply the CHG soap to your body only from the neck down. Do not use on open wounds or open sores. Avoid contact with your eyes, ears, mouth, and genitals (private parts). Wash face and genitals (private parts) with your normal soap.  6. Wash thoroughly, paying special attention to the area where your surgery will be performed.  7. Thoroughly rinse your body with warm water.  8. Do not shower/wash with your normal soap after using and rinsing off the CHG soap.  9. Pat yourself dry with a clean towel.  10. Wear clean pajamas to bed the night before surgery.  12. Place clean sheets on your bed the night of your first shower and do not sleep with pets.  13. Shower again with the CHG soap on the day of surgery prior to arriving at the hospital.  14. Do not apply any deodorants/lotions/powders.  15. Please wear clean clothes to the  hospital.

## 2022-09-10 ENCOUNTER — Encounter: Payer: Self-pay | Admitting: Vascular Surgery

## 2022-09-10 ENCOUNTER — Encounter
Admission: RE | Admit: 2022-09-10 | Discharge: 2022-09-10 | Disposition: A | Discharge status: Home / Self Care | Payer: PPO | Source: Ambulatory Visit | Attending: Vascular Surgery | Admitting: Vascular Surgery

## 2022-09-10 ENCOUNTER — Telehealth (INDEPENDENT_AMBULATORY_CARE_PROVIDER_SITE_OTHER): Payer: Self-pay

## 2022-09-10 DIAGNOSIS — N184 Chronic kidney disease, stage 4 (severe): Secondary | ICD-10-CM | POA: Diagnosis not present

## 2022-09-10 DIAGNOSIS — E1121 Type 2 diabetes mellitus with diabetic nephropathy: Secondary | ICD-10-CM | POA: Diagnosis not present

## 2022-09-10 DIAGNOSIS — Z01812 Encounter for preprocedural laboratory examination: Secondary | ICD-10-CM | POA: Diagnosis not present

## 2022-09-10 LAB — TYPE AND SCREEN
ABO/RH(D): O POS
Antibody Screen: NEGATIVE

## 2022-09-10 LAB — BASIC METABOLIC PANEL
Anion gap: 9 (ref 5–15)
BUN: 74 mg/dL — ABNORMAL HIGH (ref 8–23)
CO2: 30 mmol/L (ref 22–32)
Calcium: 8.8 mg/dL — ABNORMAL LOW (ref 8.9–10.3)
Chloride: 98 mmol/L (ref 98–111)
Creatinine, Ser: 3.26 mg/dL — ABNORMAL HIGH (ref 0.61–1.24)
GFR, Estimated: 18 mL/min — ABNORMAL LOW (ref >60)
Glucose, Bld: 277 mg/dL — ABNORMAL HIGH (ref 70–99)
Potassium: 3.8 mmol/L (ref 3.5–5.1)
Sodium: 137 mmol/L (ref 135–145)

## 2022-09-10 LAB — CBC WITH DIFFERENTIAL/PLATELET
Abs Immature Granulocytes: 0.05 10*3/uL (ref 0.00–0.07)
Basophils Absolute: 0.1 10*3/uL (ref 0.0–0.1)
Basophils Relative: 1 %
Eosinophils Absolute: 0.3 10*3/uL (ref 0.0–0.5)
Eosinophils Relative: 2 %
HCT: 37 % — ABNORMAL LOW (ref 39.0–52.0)
Hemoglobin: 11.5 g/dL — ABNORMAL LOW (ref 13.0–17.0)
Immature Granulocytes: 1 %
Lymphocytes Relative: 15 %
Lymphs Abs: 1.6 10*3/uL (ref 0.7–4.0)
MCH: 28.3 pg (ref 26.0–34.0)
MCHC: 31.1 g/dL (ref 30.0–36.0)
MCV: 90.9 fL (ref 80.0–100.0)
Monocytes Absolute: 0.8 10*3/uL (ref 0.1–1.0)
Monocytes Relative: 7 %
Neutro Abs: 7.9 10*3/uL — ABNORMAL HIGH (ref 1.7–7.7)
Neutrophils Relative %: 74 %
Platelets: 313 10*3/uL (ref 150–400)
RBC: 4.07 MIL/uL — ABNORMAL LOW (ref 4.22–5.81)
RDW: 14.1 % (ref 11.5–15.5)
WBC: 10.6 10*3/uL — ABNORMAL HIGH (ref 4.0–10.5)
nRBC: 0 % (ref 0.0–0.2)

## 2022-09-10 NOTE — Telephone Encounter (Signed)
I contacted the patient due to an unforseen situation at the hospital and he has been moved from 09/12/22 with Dr. Lucky Cowboy for surgery to 09/19/22 for his AV fistula. Patient understood.

## 2022-09-10 NOTE — Progress Notes (Signed)
Perioperative / Anesthesia Services  Pre-Admission Testing Clinical Review / Preoperative Anesthesia Consult  Date: 09/11/22  Patient Demographics:  Name: James Clarke DOB:   [birth year] MRN:   [id number]  Planned Surgical Procedure(s):    Case: [id number] Date/Time: 09/12/22 0715   Procedure: ARTERIOVENOUS (AV) FISTULA CREATION (RADIOCEPHALIC) (Left)   Anesthesia type: General   Pre-op diagnosis: CHRONIC KIDNEY DISEASE STAGE IV   Location: [facility] OR ROOM 08 / [place] [facility]S FOR ANESTHESIA GROUP   Surgeons: Algernon Huxley, MD     NOTE: Available PAT nursing documentation and vital signs have been reviewed. Clinical nursing staff has updated patient's PMH/PSHx, current medication list, and drug allergies/intolerances to ensure comprehensive history available to assist in medical decision making as it pertains to the aforementioned surgical procedure and anticipated anesthetic course. Extensive review of available clinical information personally performed. [place] PMH and PSHx updated with any diagnoses/procedures that  may have been inadvertently omitted during his intake with the pre-admission testing department's nursing staff.  Clinical Discussion:  James Clarke is a 82 y.o. male who is submitted for pre-surgical anesthesia review and clearance prior to him undergoing the above procedure. Patient has never been a smoker. Pertinent PMH includes: CAD, HFpEF HTN, HLD, T2DM, CKD IV, DOE, OSAH (requires nocturnal BiPAP therapy), asthma, peripheral edema, hyperparathyroidism, anemia of chronic renal disease, OA, nephrolithiasis.  Patient is followed by cardiology Nehemiah Massed, MD). He was last seen in the cardiology clinic on 08/14/2022; notes reviewed. At the time of his clinic visit, patient doing well overall from a cardiovascular perspective. Patient denied any chest pain, shortness of breath, PND, orthopnea, palpitations, significant peripheral edema, weakness, fatigue, vertiginous symptoms, or  presyncope/syncope. Patient with a past medical history significant for cardiovascular diagnoses. Documented physical exam was grossly benign, providing no evidence of acute exacerbation and/or decompensation of the patient's known cardiovascular conditions.  Diagnostic LEFT heart catheterization was performed on 12/31/2021 revealing multivessel CAD; 60% mid LAD, 20% proximal RCA, 20% distal RCA, and 20% ostial proximal LCx.  Interventional cardiology was consulted.  Lesions felt to be nonobstructive, therefore intervention was deferred opting for medical management of observed coronary artery disease.  Most recent TTE was performed on 07/09/2022 revealing a mildly reduced left ventricular systolic function with an EF of 45%.  There was posterior hypokinesis and mild LVH noted. Left ventricular diastolic Doppler parameters consistent with abnormal relaxation (G1DD). Moderate left atrial enlargement observed.  There was trivial tricuspid and pulmonary, in addition to mild mitral valve regurgitation.  Blood pressure uncontrolled at 150/84 mmHg on currently prescribed CCB (amlodipine), vasodilator (hydralazine), nitrate (isosorbide mononitrate), diuretic (furosemide), and beta-blocker (metoprolol succinate) therapies.  Patient is on lovastatin for his HLD diagnosis and ASCVD prevention. T2DM well controlled on currently prescribed regimen; last HgbA1c was 6.8% when checked on 06/21/2022. He does have an OSAH diagnosis and requires the use of nocturnal BiPAP therapy; reported to be compliant with prescribed therapy. Functional capacity limited by age and multiple medical comorbidities, however with that being said, patient still felt to be able to achieve at least 4 METS of physical activity without experiencing any degree of angina/anginal equivalent symptoms. No changes were made to his medication regimen during his visit with cardiology.  Patient scheduled to follow-up with outpatient cardiology in 3 months or  sooner if needed.  James Clarke is scheduled for an LEFT ARTERIOVENOUS (AV) FISTULA CREATION (RADIOCEPHALIC) on [id number] with Dr. Leotis Pain, MD.  Given patient's past medical history significant for cardiovascular diagnoses, presurgical cardiac clearance  was sought by the PAT team. Per cardiology, "this patient is optimized for surgery and may proceed with the planned procedural course with a LOW risk of significant perioperative cardiovascular complications".    In review of his medication reconciliation, it is noted that patient is currently on prescribed daily antiplatelet therapy.  Per standing orders from Dr. Lucky Cowboy for this procedure, patient was advised on recommendations for continuing his daily low-dose ASA throughout his perioperative course.  Patient denies previous perioperative complications with anesthesia in the past. In review of the available records, it is noted that patient underwent a general anesthetic course here at [facility] (ASA III) in 02/2022 without documented complications.      09/09/2022    1:19 PM 09/05/2022    1:07 PM 08/23/2022    9:21 AM  Vitals with BMI  Height 5' 10"$   5' 10"$   Weight 307 lbs 314 lbs 309 lbs  BMI 44.05 [id number]  Systolic  [id number]  Diastolic  72 71  Pulse  73 69    Providers/Specialists:   NOTE: Primary physician provider listed below. Patient may have been seen by APP or partner within same practice.   PROVIDER ROLE / SPECIALTY LAST Imelda Pillow, MD Vascular Surgery (Surgeon) 08/23/2022  Sofie Hartigan, MD Primary Care Provider 06/24/2022  Serafina Royals, MD Cardiology 08/14/2022  Lavone Orn, MD Endocrinology 06/25/2022  Anthonette Legato, MD Nephrology 08/08/2022  Darylene Price, FNP-C Advanced Heart Failure 09/05/2022   Allergies:  Codeine, Doxycycline, and Erythromycin  Current Home Medications:   No current facility-administered medications for this encounter.    acetaminophen  (TYLENOL) 500 MG tablet   albuterol (PROVENTIL HFA;VENTOLIN HFA) 108 (90 Base) MCG/ACT inhaler   AMLODIPINE BESYLATE PO   aspirin EC 81 MG tablet   azithromycin (ZITHROMAX) 250 MG tablet   calcitRIOL (ROCALTROL) 0.25 MCG capsule   cetirizine (ZYRTEC) 10 MG chewable tablet   Continuous Blood Gluc Sensor (FREESTYLE LIBRE 14 DAY SENSOR) MISC   fluticasone (FLONASE) 50 MCG/ACT nasal spray   furosemide (LASIX) 40 MG tablet   GLOBAL INJECT EASE INSULIN SYR 31G X 5/16" 0.5 ML MISC   glucose blood (ONETOUCH ULTRA) test strip   hydrALAZINE (APRESOLINE) 50 MG tablet   insulin aspart (NOVOLOG) 100 UNIT/ML injection   insulin detemir (LEVEMIR) 100 UNIT/ML injection   isosorbide mononitrate (IMDUR) 30 MG 24 hr tablet   Lancets (ONETOUCH DELICA PLUS Q000111Q) MISC   lovastatin (MEVACOR) 40 MG tablet   metoprolol succinate (TOPROL-XL) 50 MG 24 hr tablet   nitroGLYCERIN (NITROSTAT) 0.4 MG SL tablet   potassium citrate (UROCIT-K) 10 MEQ (1080 MG) SR tablet   tamsulosin (FLOMAX) 0.4 MG CAPS capsule   traMADol (ULTRAM) 50 MG tablet   History:   Past Medical History:  Diagnosis Date   (HFpEF) heart failure with preserved ejection fraction (HCC)    a.) TTE 11/26/2021: EF 45%, mod LVH, post HK, mod LAE, mild MR/TR, G2DD; b.) TTE 02/26/2022: EF 55-60%, BAE, triv MR, G2DD; c.) TTE 07/09/2022: EF 45%, post HK, LVH, mod LAE, triv TR/PR, mild MR, G1DD   Anemia of chronic renal failure    Aortic atherosclerosis (HCC)    Asthma    Bell's palsy    BPH (benign prostatic hyperplasia)    CAD (coronary artery disease)    a.) LHC 12/31/2021: 60% mLAD, 20% pRCA, 20% dRCA, 20% o-pLCx --> med mgmt.   CKD (chronic kidney disease), stage IV (HCC)    Diabetic  neuropathy ([place])    Dyspnea on exertion    Ganglion cyst of wrist, right    a.) s/p excision 04/2011   Hepatic steatosis    History of 2019 novel coronavirus disease (COVID-19)    History of bilateral cataract extraction 2018   Hyperlipidemia     Hyperparathyroidism due to renal insufficiency ([place])    Hypertension    Low testosterone in male    Nephrolithiasis    Obesity    OSA treated with BiPAP    Osteoarthritis    Peripheral edema    Right inguinal hernia    a.) s/p repair   Skin cancer    Type 2 diabetes mellitus with renal manifestations [facility])    Past Surgical History:  Procedure Laterality Date   APPENDECTOMY     CATARACT EXTRACTION W/PHACO Left 05/20/2017   Procedure: CATARACT EXTRACTION PHACO AND INTRAOCULAR LENS PLACEMENT ([place]);  Surgeon: Birder Robson, MD;  Location: [facility] ORS;  Service: Ophthalmology;  Laterality: Left;  [place] 00:32.0 AP% 15.5 CDE 4.97 Fluid Pack Lot # [id number]   CATARACT EXTRACTION W/PHACO Right 06/10/2017   Procedure: CATARACT EXTRACTION PHACO AND INTRAOCULAR LENS PLACEMENT (I[facility]);  Surgeon: Birder Robson, MD;  Location: [facility] ORS;  Service: Ophthalmology;  Laterality: Right;  [place]  00:51 AP% 15.4 CDE 7.95 Fluid pack lot # [id number]   CHOLECYSTECTOMY     COLONOSCOPY     x3   CYSTOSCOPY W/ RETROGRADES Right 08/13/2019   Procedure: CYSTOSCOPY WITH RETROGRADE PYELOGRAM;  Surgeon: Billey Co, MD;  Location: [facility] ORS;  Service: Urology;  Laterality: Right;   CYSTOSCOPY WITH STENT PLACEMENT Right 07/26/2019   Procedure: CYSTOSCOPY WITH STENT PLACEMENT;  Surgeon: Lucas Mallow, MD;  Location: [facility] ORS;  Service: Urology;  Laterality: Right;   CYSTOSCOPY/URETEROSCOPY/HOLMIUM LASER/STENT PLACEMENT Right 08/13/2019   Procedure: CYSTOSCOPY/URETEROSCOPY/HOLMIUM LASER/STENT EXCHANGE;  Surgeon: Billey Co, MD;  Location: [facility] ORS;  Service: Urology;  Laterality: Right;   CYSTOSCOPY/URETEROSCOPY/HOLMIUM LASER/STENT PLACEMENT Left 02/27/2022   Procedure: CYSTOSCOPY/URETEROSCOPY/HOLMIUM LASER/STENT PLACEMENT;  Surgeon: Abbie Sons, MD;  Location: [facility] ORS;  Service: Urology;  Laterality: Left;   GANGLION CYST EXCISION Right    INGUINAL HERNIA REPAIR Right    LEFT HEART CATH AND CORONARY  ANGIOGRAPHY N/A 12/31/2021   Procedure: LEFT HEART CATH AND CORONARY ANGIOGRAPHY;  Surgeon: Corey Skains, MD;  Location: [place] CV LAB;  Service: Cardiovascular;  Laterality: N/A;   SHOULDER ARTHROSCOPY WITH SUBACROMIAL DECOMPRESSION AND OPEN ROTATOR C Right 09/11/2020   Procedure: Right shoulder arthroscopic rotator cuff repair vs Regeneten patch application, subacromial decompression, and biceps tenodesis - Reche Dixon to Assist;  Surgeon: Leim Fabry, MD;  Location: [facility] ORS;  Service: Orthopedics;  Laterality: Right;   TEMPORARY DIALYSIS CATHETER N/A 03/05/2022   Procedure: TEMPORARY DIALYSIS CATHETER;  Surgeon: Katha Cabal, MD;  Location: [place] CV LAB;  Service: Cardiovascular;  Laterality: N/A;   TONSILLECTOMY     URETEROSCOPY WITH HOLMIUM LASER LITHOTRIPSY Left 02/27/2022   Procedure: URETEROSCOPY WITH HOLMIUM LASER LITHOTRIPSY;  Surgeon: Abbie Sons, MD;  Location: [facility] ORS;  Service: Urology;  Laterality: Left;   WRIST FRACTURE SURGERY Right    Family History  Problem Relation Age of Onset   Emphysema Mother    COPD Mother    Heart disease Mother    Brain cancer Father    Social History   Tobacco Use   Smoking status: Never    Passive exposure: Never   Smokeless tobacco: Never  Vaping Use  Vaping Use: Never used  Substance Use Topics   Alcohol use: No   Drug use: Not Currently    Pertinent Clinical Results:  LABS:   Hospital Outpatient Visit on 09/10/2022  Component Date Value Ref Range Status   ABO/RH(D) 09/10/2022 O POS   Final   Antibody Screen 09/10/2022 NEG   Final   Sample Expiration 09/10/2022 09/24/2022,2359   Final   Extend sample reason 09/10/2022    Final                   Value:NO TRANSFUSIONS OR PREGNANCY IN THE PAST 3 MONTHS Performed at [facility], [address] [place], [place] [address]    WBC 09/10/2022 10.6 (H)  4.0 - 10.5 K/uL Final   RBC 09/10/2022 4.07 (L)  4.22 - 5.81 MIL/uL Final   Hemoglobin  09/10/2022 11.5 (L)  13.0 - 17.0 g/dL Final   HCT 09/10/2022 37.0 (L)  39.0 - 52.0 % Final   MCV 09/10/2022 90.9  80.0 - 100.0 fL Final   MCH 09/10/2022 28.3  26.0 - 34.0 pg Final   MCHC 09/10/2022 31.1  30.0 - 36.0 g/dL Final   RDW 09/10/2022 14.1  11.5 - 15.5 % Final   Platelets 09/10/2022 313  150 - 400 K/uL Final   nRBC 09/10/2022 0.0  0.0 - 0.2 % Final   Neutrophils Relative % 09/10/2022 74  % Final   Neutro Abs 09/10/2022 7.9 (H)  1.7 - 7.7 K/uL Final   Lymphocytes Relative 09/10/2022 15  % Final   Lymphs Abs 09/10/2022 1.6  0.7 - 4.0 K/uL Final   Monocytes Relative 09/10/2022 7  % Final   Monocytes Absolute 09/10/2022 0.8  0.1 - 1.0 K/uL Final   Eosinophils Relative 09/10/2022 2  % Final   Eosinophils Absolute 09/10/2022 0.3  0.0 - 0.5 K/uL Final   Basophils Relative 09/10/2022 1  % Final   Basophils Absolute 09/10/2022 0.1  0.0 - 0.1 K/uL Final   Immature Granulocytes 09/10/2022 1  % Final   Abs Immature Granulocytes 09/10/2022 0.05  0.00 - 0.07 K/uL Final   Performed at [facility], [address] [place], [place] [address]   Sodium 09/10/2022 137  135 - 145 mmol/L Final   Potassium 09/10/2022 3.8  3.5 - 5.1 mmol/L Final   Chloride 09/10/2022 98  98 - 111 mmol/L Final   CO2 09/10/2022 30  22 - 32 mmol/L Final   Glucose, Bld 09/10/2022 277 (H)  70 - 99 mg/dL Final   Glucose reference range applies only to samples taken after fasting for at least 8 hours.   BUN 09/10/2022 74 (H)  8 - 23 mg/dL Final   Creatinine, Ser 09/10/2022 3.26 (H)  0.61 - 1.24 mg/dL Final   Calcium 09/10/2022 8.8 (L)  8.9 - 10.3 mg/dL Final   GFR, Estimated 09/10/2022 18 (L)  >60 mL/min Final   Comment: (NOTE) Calculated using the CKD-EPI Creatinine Equation (2021)    Anion gap 09/10/2022 9  5 - 15 Final   Performed at [facility], [address] [place], [place] [address]    ECG: Date: 06/13/2022 Time ECG obtained: 0007 AM Rate: 74 bpm Rhythm: normal sinus Axis (leads I  and aVF): Normal Intervals: PR 184 ms. QRS 112 ms. QTc 463 ms. ST segment and T wave changes: Nonspecific ST and T wave abnormalities  Comparison: Similar to previous tracing obtained on 06/12/2022   IMAGING / PROCEDURES: TRANSTHORACIC ECHOCARDIOGRAM performed on 07/09/2022  Mildly reduced left ventricular systolic function with an EF of 45% Moderate concentric LVH Posterior hypokinesis Left ventricular diastolic Doppler parameters consistent with abnormal relaxation (G1DD). Moderate left atrial enlargement Trivial TR/PR Mild MR Trivial posterior pericardial effusion  CT CHEST WO CONTRAST performed on 06/12/2022 Changes of interstitial lung disease as detailed above. Stable lower lobe predominant parenchymal lung calcifications. Patchy nodular ground-glass opacities in the right lower lobe may suggest a more acute inflammatory process and aspiration or bronchopneumonia are possibilities. Small bilateral pleural effusions with overlying atelectasis. Small pericardial effusion. Extensive three-vessel coronary artery calcifications. Aortic atherosclerosis   LEFT HEART CATHETERIZATION AND CORONARY ANGIOGRAPHY performed on 12/31/2021 Normal left ventricular systolic function with an EF of >65% Moderately elevated LVEDP = 24 mmHg Multivessel CAD 60% mid LAD 20% proximal RCA 20% distal RCA 20% ostial to proximal LCx Recommendations Continue medication management and risk factors including hypertension hyperlipidemia diabetes No further cardiac intervention at this time Further treatment probable sleep apnea and overweight Continue medication management for possible diastolic dysfunction heart failure   MYOCARDIAL PERFUSION IMAGING STUDY (LEXISCAN) performed on 12/06/2021 Normal left ventricular systolic function with an EF of [id number] End-diastolic cavity size mildly enlarged No ST or T wave changes concerning for ischemia Large defect with moderate reduction in uptake present in the  apical inferior, inferolateral, lateral, and apex locations that is partially reversible.  Viability present.  Abnormal wall motion in the defect area.  Findings consistent with infarction and ischemia Study determined to be high risk  Impression and Plan:  James Clarke has been referred for pre-anesthesia review and clearance prior to him undergoing the planned anesthetic and procedural courses. Available labs, pertinent testing, and imaging results were personally reviewed by me in preparation for upcoming operative/procedural course. [facility] medical record has been updated following extensive record review and patient interview with PAT staff.   This patient has been appropriately cleared by cardiology with an overall LOW risk of significant perioperative cardiovascular complications. Based on clinical review performed today (09/11/22), barring any significant acute changes in the patient's overall condition, it is anticipated that he will be able to proceed with the planned surgical intervention. Any acute changes in clinical condition may necessitate his procedure being postponed and/or cancelled. Patient will meet with anesthesia team (MD and/or CRNA) on the day of his procedure for preoperative evaluation/assessment. Questions regarding anesthetic course will be fielded at that time.   Pre-surgical instructions were reviewed with the patient during his PAT appointment, and questions were fielded to satisfaction by PAT clinical staff. He has been instructed on which medications that he will need to hold prior to surgery, as well as the ones that have been deemed safe/appropriate to take of the day of his procedure. As part of the general education provided by PAT, patient made aware both verbally and in writing, that he would need to abstain from the use of any illegal substances during his perioperative course.  He was advised that failure to follow the provided instructions could necessitate  case cancellation or result serious perioperative complications up to and including death. Patient encouraged to contact PAT and/or his surgeon's office to discuss any questions or concerns that may arise prior to surgery; verbalized understanding.   Honor Loh, MSN, APRN, FNP-C, CEN [facility]  Peri-operative Services Nurse Practitioner Phone: [phone] Fax: [phone] 09/11/22 9:58 AM  NOTE: This note has been prepared using Dragon dictation software. Despite my best ability to proofread, there is always the potential  that unintentional transcriptional errors may still occur from this process.

## 2022-09-11 DIAGNOSIS — N184 Chronic kidney disease, stage 4 (severe): Secondary | ICD-10-CM | POA: Diagnosis not present

## 2022-09-11 DIAGNOSIS — N2581 Secondary hyperparathyroidism of renal origin: Secondary | ICD-10-CM | POA: Diagnosis not present

## 2022-09-11 DIAGNOSIS — D631 Anemia in chronic kidney disease: Secondary | ICD-10-CM | POA: Diagnosis not present

## 2022-09-11 DIAGNOSIS — R809 Proteinuria, unspecified: Secondary | ICD-10-CM | POA: Diagnosis not present

## 2022-09-11 DIAGNOSIS — E1122 Type 2 diabetes mellitus with diabetic chronic kidney disease: Secondary | ICD-10-CM | POA: Diagnosis not present

## 2022-09-11 DIAGNOSIS — I10 Essential (primary) hypertension: Secondary | ICD-10-CM | POA: Diagnosis not present

## 2022-09-18 DIAGNOSIS — I10 Essential (primary) hypertension: Secondary | ICD-10-CM | POA: Diagnosis not present

## 2022-09-18 DIAGNOSIS — N184 Chronic kidney disease, stage 4 (severe): Secondary | ICD-10-CM | POA: Diagnosis not present

## 2022-09-18 DIAGNOSIS — R809 Proteinuria, unspecified: Secondary | ICD-10-CM | POA: Diagnosis not present

## 2022-09-18 DIAGNOSIS — N2581 Secondary hyperparathyroidism of renal origin: Secondary | ICD-10-CM | POA: Diagnosis not present

## 2022-09-18 DIAGNOSIS — D631 Anemia in chronic kidney disease: Secondary | ICD-10-CM | POA: Diagnosis not present

## 2022-09-18 DIAGNOSIS — E1122 Type 2 diabetes mellitus with diabetic chronic kidney disease: Secondary | ICD-10-CM | POA: Diagnosis not present

## 2022-09-18 MED ORDER — CEFAZOLIN SODIUM-DEXTROSE 2-4 GM/100ML-% IV SOLN
2.0000 g | INTRAVENOUS | Status: AC
  Administered 2022-09-19: 2 g via INTRAVENOUS

## 2022-09-18 MED ORDER — FAMOTIDINE 20 MG PO TABS: 20.0000 mg | ORAL_TABLET | Freq: Once | ORAL | Status: AC

## 2022-09-18 MED ORDER — SODIUM CHLORIDE 0.9 % IV SOLN: INTRAVENOUS | Status: DC

## 2022-09-18 MED ORDER — CHLORHEXIDINE GLUCONATE 0.12 % MT SOLN: 15.0000 mL | Freq: Once | OROMUCOSAL | Status: AC

## 2022-09-18 MED ORDER — CHLORHEXIDINE GLUCONATE CLOTH 2 % EX PADS: 6.0000 | MEDICATED_PAD | Freq: Once | CUTANEOUS | Status: DC

## 2022-09-18 MED ORDER — ORAL CARE MOUTH RINSE: 15.0000 mL | Freq: Once | OROMUCOSAL | Status: AC

## 2022-09-19 ENCOUNTER — Ambulatory Visit: Payer: PPO | Admitting: Urgent Care

## 2022-09-19 ENCOUNTER — Encounter: Payer: Self-pay | Admitting: Vascular Surgery

## 2022-09-19 ENCOUNTER — Other Ambulatory Visit: Payer: Self-pay

## 2022-09-19 ENCOUNTER — Ambulatory Visit: Payer: PPO | Admitting: Urology

## 2022-09-19 ENCOUNTER — Encounter
Admission: RE | Disposition: A | Discharge status: Home / Self Care | Payer: Self-pay | Source: Ambulatory Visit | Attending: Vascular Surgery

## 2022-09-19 ENCOUNTER — Ambulatory Visit
Admission: RE | Admit: 2022-09-19 | Discharge: 2022-09-19 | Disposition: A | Discharge status: Home / Self Care | Payer: PPO | Source: Ambulatory Visit | Attending: Vascular Surgery | Admitting: Vascular Surgery

## 2022-09-19 DIAGNOSIS — E1122 Type 2 diabetes mellitus with diabetic chronic kidney disease: Secondary | ICD-10-CM | POA: Diagnosis not present

## 2022-09-19 DIAGNOSIS — Z79899 Other long term (current) drug therapy: Secondary | ICD-10-CM | POA: Diagnosis not present

## 2022-09-19 DIAGNOSIS — I509 Heart failure, unspecified: Secondary | ICD-10-CM | POA: Diagnosis not present

## 2022-09-19 DIAGNOSIS — I129 Hypertensive chronic kidney disease with stage 1 through stage 4 chronic kidney disease, or unspecified chronic kidney disease: Secondary | ICD-10-CM | POA: Diagnosis not present

## 2022-09-19 DIAGNOSIS — Z01818 Encounter for other preprocedural examination: Secondary | ICD-10-CM

## 2022-09-19 DIAGNOSIS — N184 Chronic kidney disease, stage 4 (severe): Secondary | ICD-10-CM | POA: Insufficient documentation

## 2022-09-19 DIAGNOSIS — G4730 Sleep apnea, unspecified: Secondary | ICD-10-CM | POA: Diagnosis not present

## 2022-09-19 DIAGNOSIS — Z794 Long term (current) use of insulin: Secondary | ICD-10-CM | POA: Diagnosis not present

## 2022-09-19 DIAGNOSIS — E785 Hyperlipidemia, unspecified: Secondary | ICD-10-CM | POA: Insufficient documentation

## 2022-09-19 DIAGNOSIS — G4733 Obstructive sleep apnea (adult) (pediatric): Secondary | ICD-10-CM | POA: Diagnosis not present

## 2022-09-19 DIAGNOSIS — I130 Hypertensive heart and chronic kidney disease with heart failure and stage 1 through stage 4 chronic kidney disease, or unspecified chronic kidney disease: Secondary | ICD-10-CM | POA: Diagnosis not present

## 2022-09-19 HISTORY — DX: Hyperlipidemia, unspecified: E78.5

## 2022-09-19 HISTORY — DX: Type 2 diabetes mellitus with other diabetic kidney complication: E11.29

## 2022-09-19 HISTORY — DX: Anemia in chronic kidney disease: N18.9

## 2022-09-19 HISTORY — DX: Obstructive sleep apnea (adult) (pediatric): G47.33

## 2022-09-19 HISTORY — DX: Other forms of dyspnea: R06.09

## 2022-09-19 HISTORY — DX: Fatty (change of) liver, not elsewhere classified: K76.0

## 2022-09-19 HISTORY — DX: Type 2 diabetes mellitus with diabetic neuropathy, unspecified: E11.40

## 2022-09-19 HISTORY — DX: Localized edema: R60.0

## 2022-09-19 HISTORY — DX: Atherosclerotic heart disease of native coronary artery without angina pectoris: I25.10

## 2022-09-19 HISTORY — DX: Anemia in chronic kidney disease: D63.1

## 2022-09-19 HISTORY — DX: Unspecified osteoarthritis, unspecified site: M19.90

## 2022-09-19 HISTORY — DX: Personal history of COVID-19: Z86.16

## 2022-09-19 HISTORY — DX: Calculus of kidney: N20.0

## 2022-09-19 HISTORY — DX: Ganglion, right wrist: M67.431

## 2022-09-19 HISTORY — DX: Unspecified diastolic (congestive) heart failure: I50.30

## 2022-09-19 HISTORY — DX: Benign prostatic hyperplasia without lower urinary tract symptoms: N40.0

## 2022-09-19 HISTORY — DX: Chronic kidney disease, stage 4 (severe): N18.4

## 2022-09-19 HISTORY — PX: AV FISTULA PLACEMENT: SHX1204

## 2022-09-19 HISTORY — DX: Unilateral inguinal hernia, without obstruction or gangrene, not specified as recurrent: K40.90

## 2022-09-19 HISTORY — DX: Secondary hyperparathyroidism of renal origin: N25.81

## 2022-09-19 HISTORY — DX: Atherosclerosis of aorta: I70.0

## 2022-09-19 HISTORY — DX: Unspecified malignant neoplasm of skin, unspecified: C44.90

## 2022-09-19 HISTORY — DX: Other specified abnormal findings of blood chemistry: R79.89

## 2022-09-19 HISTORY — DX: Edema, unspecified: R60.9

## 2022-09-19 LAB — POCT I-STAT, CHEM 8
BUN: 71 mg/dL — ABNORMAL HIGH (ref 8–23)
Calcium, Ion: 1.08 mmol/L — ABNORMAL LOW (ref 1.15–1.40)
Chloride: 102 mmol/L (ref 98–111)
Creatinine, Ser: 3.6 mg/dL — ABNORMAL HIGH (ref 0.61–1.24)
Glucose, Bld: 289 mg/dL — ABNORMAL HIGH (ref 70–99)
HCT: 32 % — ABNORMAL LOW (ref 39.0–52.0)
Hemoglobin: 10.9 g/dL — ABNORMAL LOW (ref 13.0–17.0)
Potassium: 3.8 mmol/L (ref 3.5–5.1)
Sodium: 140 mmol/L (ref 135–145)
TCO2: 26 mmol/L (ref 22–32)

## 2022-09-19 LAB — ABO/RH: ABO/RH(D): O POS

## 2022-09-19 LAB — GLUCOSE, CAPILLARY
Glucose-Capillary: 269 mg/dL — ABNORMAL HIGH (ref 70–99)
Glucose-Capillary: 304 mg/dL — ABNORMAL HIGH (ref 70–99)
Glucose-Capillary: 298 mg/dL — ABNORMAL HIGH (ref 70–99)

## 2022-09-19 SURGERY — ARTERIOVENOUS (AV) FISTULA CREATION
Anesthesia: General | Laterality: Left

## 2022-09-19 MED ORDER — FENTANYL CITRATE (PF) 100 MCG/2ML IJ SOLN
INTRAMUSCULAR | Status: AC
  Filled 2022-09-19: qty 2

## 2022-09-19 MED ORDER — HEPARIN SODIUM (PORCINE) 1000 UNIT/ML IJ SOLN
INTRAMUSCULAR | Status: DC | PRN
  Administered 2022-09-19: 4000 [IU] via INTRAVENOUS

## 2022-09-19 MED ORDER — CHLORHEXIDINE GLUCONATE 0.12 % MT SOLN
OROMUCOSAL | Status: AC
  Administered 2022-09-19: 15 mL via OROMUCOSAL
  Filled 2022-09-19: qty 15

## 2022-09-19 MED ORDER — DEXMEDETOMIDINE HCL IN NACL 80 MCG/20ML IV SOLN
INTRAVENOUS | Status: DC | PRN
  Administered 2022-09-19 (×2): 4 ug via BUCCAL

## 2022-09-19 MED ORDER — SODIUM CHLORIDE 0.9 % IV SOLN
INTRAVENOUS | Status: DC | PRN
  Administered 2022-09-19: 501 mL via SURGICAL_CAVITY

## 2022-09-19 MED ORDER — LIDOCAINE HCL (PF) 2 % IJ SOLN
INTRAMUSCULAR | Status: AC
  Filled 2022-09-19: qty 5

## 2022-09-19 MED ORDER — PHENYLEPHRINE HCL-NACL 20-0.9 MG/250ML-% IV SOLN
INTRAVENOUS | Status: DC | PRN
  Administered 2022-09-19: 20 ug/min via INTRAVENOUS

## 2022-09-19 MED ORDER — EPHEDRINE SULFATE (PRESSORS) 50 MG/ML IJ SOLN
INTRAMUSCULAR | Status: DC | PRN
  Administered 2022-09-19 (×3): 5 mg via INTRAVENOUS

## 2022-09-19 MED ORDER — HEPARIN SODIUM (PORCINE) 5000 UNIT/ML IJ SOLN
INTRAMUSCULAR | Status: AC
  Filled 2022-09-19: qty 1

## 2022-09-19 MED ORDER — OXYCODONE HCL 5 MG PO TABS: 5.0000 mg | ORAL_TABLET | Freq: Once | ORAL | Status: DC | PRN

## 2022-09-19 MED ORDER — FAMOTIDINE 20 MG PO TABS
ORAL_TABLET | ORAL | Status: AC
  Administered 2022-09-19: 20 mg via ORAL
  Filled 2022-09-19: qty 1

## 2022-09-19 MED ORDER — SUGAMMADEX SODIUM 200 MG/2ML IV SOLN
INTRAVENOUS | Status: DC | PRN
  Administered 2022-09-19: 200 mg via INTRAVENOUS

## 2022-09-19 MED ORDER — INSULIN ASPART 100 UNIT/ML IJ SOLN
INTRAMUSCULAR | Status: AC
  Filled 2022-09-19: qty 1

## 2022-09-19 MED ORDER — INSULIN ASPART 100 UNIT/ML IJ SOLN
10.0000 [IU] | Freq: Once | INTRAMUSCULAR | Status: AC
  Administered 2022-09-19: 10 [IU] via SUBCUTANEOUS

## 2022-09-19 MED ORDER — CEFAZOLIN SODIUM-DEXTROSE 2-4 GM/100ML-% IV SOLN
INTRAVENOUS | Status: AC
  Filled 2022-09-19: qty 100

## 2022-09-19 MED ORDER — TRAMADOL HCL 50 MG PO TABS: 50.0000 mg | ORAL_TABLET | Freq: Two times a day (BID) | ORAL | 1 refills | Status: DC | PRN

## 2022-09-19 MED ORDER — FENTANYL CITRATE (PF) 100 MCG/2ML IJ SOLN: 25.0000 ug | INTRAMUSCULAR | Status: DC | PRN

## 2022-09-19 MED ORDER — LIDOCAINE HCL (CARDIAC) PF 100 MG/5ML IV SOSY
PREFILLED_SYRINGE | INTRAVENOUS | Status: DC | PRN
  Administered 2022-09-19: 100 mg via INTRAVENOUS

## 2022-09-19 MED ORDER — INSULIN ASPART 100 UNIT/ML IJ SOLN
5.0000 [IU] | Freq: Once | INTRAMUSCULAR | Status: AC
  Administered 2022-09-19: 5 [IU] via SUBCUTANEOUS

## 2022-09-19 MED ORDER — FENTANYL CITRATE (PF) 100 MCG/2ML IJ SOLN
INTRAMUSCULAR | Status: DC | PRN
  Administered 2022-09-19: 50 ug via INTRAVENOUS
  Administered 2022-09-19: 25 ug via INTRAVENOUS

## 2022-09-19 MED ORDER — PROPOFOL 1000 MG/100ML IV EMUL
INTRAVENOUS | Status: AC
  Filled 2022-09-19: qty 100

## 2022-09-19 MED ORDER — PROPOFOL 10 MG/ML IV BOLUS
INTRAVENOUS | Status: DC | PRN
  Administered 2022-09-19: 140 mg via INTRAVENOUS

## 2022-09-19 MED ORDER — ONDANSETRON HCL 4 MG/2ML IJ SOLN
INTRAMUSCULAR | Status: DC | PRN
  Administered 2022-09-19: 4 mg via INTRAVENOUS

## 2022-09-19 MED ORDER — ROCURONIUM BROMIDE 100 MG/10ML IV SOLN
INTRAVENOUS | Status: DC | PRN
  Administered 2022-09-19: 60 mg via INTRAVENOUS

## 2022-09-19 MED ORDER — DEXMEDETOMIDINE HCL IN NACL 80 MCG/20ML IV SOLN
INTRAVENOUS | Status: AC
  Filled 2022-09-19: qty 20

## 2022-09-19 MED ORDER — OXYCODONE HCL 5 MG/5ML PO SOLN: 5.0000 mg | Freq: Once | ORAL | Status: DC | PRN

## 2022-09-19 SURGICAL SUPPLY — 50 items
ADH SKN CLS APL DERMABOND .7 (GAUZE/BANDAGES/DRESSINGS) ×1
APL PRP STRL LF DISP 70% ISPRP (MISCELLANEOUS) ×1
BAG DECANTER FOR FLEXI CONT (MISCELLANEOUS) ×1 IMPLANT
BLADE SURG SZ11 CARB STEEL (BLADE) ×1 IMPLANT
BOOT SUTURE VASCULAR YLW (MISCELLANEOUS) ×1
BRUSH SCRUB EZ  4% CHG (MISCELLANEOUS) ×1
BRUSH SCRUB EZ 4% CHG (MISCELLANEOUS) ×1 IMPLANT
CHLORAPREP W/TINT 26 (MISCELLANEOUS) ×1 IMPLANT
CLAMP SUTURE YELLOW 5 PAIRS (MISCELLANEOUS) ×1 IMPLANT
CLIP SPRNG 6 S-JAW DBL (CLIP) ×1 IMPLANT
CLIP SPRNG 6MM S-JAW DBL (CLIP) ×1
DERMABOND ADVANCED .7 DNX12 (GAUZE/BANDAGES/DRESSINGS) IMPLANT
ELECT CAUTERY BLADE 6.4 (BLADE) ×1 IMPLANT
ELECT REM PT RETURN 9FT ADLT (ELECTROSURGICAL) ×1
ELECTRODE REM PT RTRN 9FT ADLT (ELECTROSURGICAL) ×1 IMPLANT
GLOVE BIO SURGEON STRL SZ7 (GLOVE) ×2 IMPLANT
GOWN STRL REUS W/ TWL LRG LVL3 (GOWN DISPOSABLE) ×2 IMPLANT
GOWN STRL REUS W/ TWL XL LVL3 (GOWN DISPOSABLE) ×2 IMPLANT
GOWN STRL REUS W/TWL LRG LVL3 (GOWN DISPOSABLE) ×2
GOWN STRL REUS W/TWL XL LVL3 (GOWN DISPOSABLE) ×2
HEMOSTAT SURGICEL 2X3 (HEMOSTASIS) ×1 IMPLANT
IV NS 500ML (IV SOLUTION) ×1
IV NS 500ML BAXH (IV SOLUTION) ×1 IMPLANT
KIT TURNOVER KIT A (KITS) ×1 IMPLANT
LABEL OR SOLS (LABEL) ×1 IMPLANT
LOOP VESSEL MAXI  1X406 RED (MISCELLANEOUS) ×1
LOOP VESSEL MAXI 1X406 RED (MISCELLANEOUS) ×1 IMPLANT
LOOP VESSEL MINI 0.8X406 BLUE (MISCELLANEOUS) ×1 IMPLANT
MANIFOLD NEPTUNE II (INSTRUMENTS) ×1 IMPLANT
NS IRRIG 500ML POUR BTL (IV SOLUTION) ×1 IMPLANT
PACK EXTREMITY ARMC (MISCELLANEOUS) ×1 IMPLANT
PAD PREP 24X41 OB/GYN DISP (PERSONAL CARE ITEMS) ×1 IMPLANT
SOLUTION CELL SAVER (CLIP) ×1 IMPLANT
STOCKINETTE 48X4 2 PLY STRL (GAUZE/BANDAGES/DRESSINGS) ×1 IMPLANT
STOCKINETTE STRL 4IN 9604848 (GAUZE/BANDAGES/DRESSINGS) ×1 IMPLANT
SUT MNCRL AB 4-0 PS2 18 (SUTURE) ×1 IMPLANT
SUT PROLENE 6 0 BV (SUTURE) ×4 IMPLANT
SUT SILK 2 0 (SUTURE) ×1
SUT SILK 2-0 18XBRD TIE 12 (SUTURE) ×1 IMPLANT
SUT SILK 3 0 (SUTURE) ×1
SUT SILK 3-0 18XBRD TIE 12 (SUTURE) ×1 IMPLANT
SUT SILK 4 0 (SUTURE) ×1
SUT SILK 4-0 18XBRD TIE 12 (SUTURE) ×1 IMPLANT
SUT VIC AB 3-0 SH 27 (SUTURE) ×1
SUT VIC AB 3-0 SH 27X BRD (SUTURE) ×1 IMPLANT
SYR 20ML LL LF (SYRINGE) ×1 IMPLANT
SYR 3ML 18GX1 1/2 (SYRINGE) IMPLANT
TAG SUTURE CLAMP YLW 5PR (MISCELLANEOUS) ×1
TRAP FLUID SMOKE EVACUATOR (MISCELLANEOUS) ×1 IMPLANT
WATER STERILE IRR 500ML POUR (IV SOLUTION) ×1 IMPLANT

## 2022-09-19 NOTE — Op Note (Signed)
[facility]   OPERATIVE NOTE   PROCEDURE: Left radiocephaic arteriovenous fistula placement  PRE-OPERATIVE DIAGNOSIS: 1. Stage IV CKD  POST-OPERATIVE DIAGNOSIS: Same  SURGEON: Leotis Pain, MD  ASSISTANT(S): none  ANESTHESIA: general  ESTIMATED BLOOD LOSS: 5 cc  FINDING(S): none  SPECIMEN(S):  None  INDICATIONS:   James Clarke is a 82 y.o. male who presents with advanced CKD nearing dialysis dependence.  The patient is scheduled for left radiocephalic arteriovenous fistula placement when non-invasive studies suggested adequate anatomy for fistula creation at this location.  The patient is aware the risks include but are not limited to: bleeding, infection, steal syndrome, nerve damage, ischemic monomelic neuropathy, failure to mature, and need for additional procedures.  The patient is aware of the risks of the procedure and elects to proceed forward.  DESCRIPTION: After full informed written consent was obtained from the patient, the patient was brought back to the operating room and placed supine upon the operating table.  Prior to induction, the patient received IV antibiotics.   After obtaining adequate anesthesia, the patient was then prepped and draped in the standard fashion for a left arm access procedure.  I turned my attention first to identifying the patient's distal cephalic vein and radial artery.  I made an incision at the level of the distal forearm and wrist and dissected through the subcutaneous tissue and fascia to gain exposure of the radial artery.  This was noted to be useable for fistula creation.  This was dissected out proximally and distally and controlled with vessel loops.  I then dissected out the cephalic vein.  This was noted to be patent and adequate size for fistula creation. I then gave the patient 4000 units of intravenous heparin.  The distal segment of the vein was ligated with a  2-0 silk, and the vein was transected.  The  proximal segment was interrogated with serial dilators.  The vein accepted up to a 3 mm dilator without any difficulty.  I then instilled the heparinized saline into the vein and clamped it.  At this point, I reset my exposure of the radial artery and placed the artery under tension proximally and distally.  I made an arteriotomy with a #11 blade, and then I extended the arteriotomy with a Potts scissor.  I injected heparinized saline proximal and distal to this arteriotomy.  The vein was then sewn to the artery in an end-to-side configuration with a running stitch of 6-0 Prolene.  Prior to completing this anastomosis, I allowed the vein and artery to backbleed.  There was no evidence of clot from any vessels.  I completed the anastomosis in the usual fashion and then released all vessel loops and clamps.  There was a palpable  thrill in the venous outflow, and there was a palpable radial pulse beyond the anastomosis.  At this point, I irrigated out the surgical wound. Surgicel was placed. There was no further active bleeding.  The subcutaneous tissue was reapproximated with a running stitch of 3-0 Vicryl.  The skin was then reapproximated with a running subcuticular stitch of 4-0 Monocryl.  The skin was then cleaned, dried, and reinforced with Dermabond.  The patient tolerated this procedure well and was taken to the recovery room in stable condition  COMPLICATIONS: None  CONDITION: Stable   Leotis Pain, MD 09/19/2022 9:11 AM   This note was created with Dragon Medical transcription system. Any errors in dictation are purely unintentional.

## 2022-09-19 NOTE — Discharge Instructions (Signed)

## 2022-09-19 NOTE — Anesthesia Postprocedure Evaluation (Signed)
Anesthesia Post Note  Patient: James Clarke  Procedure(s) Performed: ARTERIOVENOUS (AV) FISTULA CREATION (RADIO CEPHALIC) (Left)  Patient location during evaluation: PACU Anesthesia Type: General Level of consciousness: awake and alert Pain management: pain level controlled Vital Signs Assessment: post-procedure vital signs reviewed and stable Respiratory status: spontaneous breathing, nonlabored ventilation, respiratory function stable and patient connected to nasal cannula oxygen Cardiovascular status: blood pressure returned to baseline and stable Postop Assessment: no apparent nausea or vomiting Anesthetic complications: no  No notable events documented.   Last Vitals:  Vitals:   09/19/22 0628 09/19/22 0900  BP: 139/65 119/65  Pulse: 68 73  Resp: 16 (!) 23  Temp: 36.6 C (!) 36.4 C  SpO2: 99% 100%    Last Pain:  Vitals:   09/19/22 0900  TempSrc:   PainSc: 0-No pain                 Dimas Millin

## 2022-09-19 NOTE — Anesthesia Procedure Notes (Addendum)
Procedure Name: Intubation Date/Time: 09/19/2022 7:37 AM  Performed by: Jerrye Noble, CRNAPre-anesthesia Checklist: Patient identified, Emergency Drugs available, Suction available and Patient being monitored Patient Re-evaluated:Patient Re-evaluated prior to induction Oxygen Delivery Method: Circle system utilized Preoxygenation: Pre-oxygenation with 100% oxygen Induction Type: IV induction Ventilation: Mask ventilation without difficulty Laryngoscope Size: Mac and 4 Grade View: Grade II Tube type: Oral Tube size: 7.0 mm Number of attempts: 1 Airway Equipment and Method: Stylet, Oral airway and Video-laryngoscopy Placement Confirmation: ETT inserted through vocal cords under direct vision, positive ETCO2 and breath sounds checked- equal and bilateral Secured at: 22 cm Tube secured with: Tape Dental Injury: Teeth and Oropharynx as per pre-operative assessment

## 2022-09-19 NOTE — Progress Notes (Signed)
Gave 10 U Insulin per Dr. Emilio Aspen oder at 0913, CBG 304.  Rechecked CBG 298 at 0947.  Dr. Barbra Sarks stated ok to d/c from PACU for home.  NAD, VSS.

## 2022-09-19 NOTE — Interval H&P Note (Signed)
History and Physical Interval Note:  09/19/2022 7:16 AM  James Clarke  has presented today for surgery, with the diagnosis of CHRONIC KIDNEY DISEASE STAGE IV.  The various methods of treatment have been discussed with the patient and family. After consideration of risks, benefits and other options for treatment, the patient has consented to  Procedure(s): ARTERIOVENOUS (AV) FISTULA CREATION (RADIO CEPHALIC) (Left) as a surgical intervention.  The patient's history has been reviewed, patient examined, no change in status, stable for surgery.  I have reviewed the patient's chart and labs.  Questions were answered to the patient's satisfaction.     Leotis Pain

## 2022-09-19 NOTE — Transfer of Care (Signed)
Immediate Anesthesia Transfer of Care Note  Patient: James Clarke  Procedure(s) Performed: ARTERIOVENOUS (AV) FISTULA CREATION (RADIO CEPHALIC) (Left)  Patient Location: PACU  Anesthesia Type:General  Level of Consciousness: awake, drowsy, and patient cooperative  Airway & Oxygen Therapy: Patient Spontanous Breathing and Patient connected to face mask oxygen  Post-op Assessment: Report given to RN and Post -op Vital signs reviewed and stable  Post vital signs: Reviewed and stable  Last Vitals:  Vitals Value Taken Time  BP 119/98 09/19/22 0900  Temp    Pulse 75 09/19/22 0904  Resp 19 09/19/22 0904  SpO2 100 % 09/19/22 0904  Vitals shown include unvalidated device data.  Last Pain:  Vitals:   09/19/22 0628  TempSrc: Oral  PainSc: 0-No pain         Complications: No notable events documented.

## 2022-09-19 NOTE — Anesthesia Preprocedure Evaluation (Signed)
Anesthesia Evaluation  Patient identified by MRN, date of birth, ID band Patient awake    Reviewed: Allergy & Precautions, NPO status , Patient's Chart, lab work & pertinent test results  History of Anesthesia Complications Negative for: history of anesthetic complications  Airway Mallampati: IV  TM Distance: >3 FB Neck ROM: Full    Dental  (+) Missing, Caps, Dental Advidsory Given   Pulmonary asthma , sleep apnea and Continuous Positive Airway Pressure Ventilation    Pulmonary exam normal breath sounds clear to auscultation       Cardiovascular hypertension, + CAD and +CHF  (-) Past MI and (-) CABG Normal cardiovascular exam Rhythm:Regular Rate:Normal  Myocardial perfusion 12/05/21:  .  Findings are consistent with ischemia and prior myocardial infarction. The study is high risk. .  No ST deviation was noted. .  LV perfusion is abnormal. Defect 1: There is a large defect with moderate reduction in uptake present in the apical inferior, inferolateral, lateral and apex location(s) that is partially reversible. Viability is present. There is abnormal wall motion in the defect area.  Inferior apical lateral overall normal left ventricular function Consistent with infarction and ischemia. .  Left ventricular function is normal. Nuclear stress EF: 57 %. The left ventricular ejection fraction is normal (55-65%). End diastolic cavity size is mildly enlarged. End systolic cavity size is normal.  Echo [id number]:  MILD LV SYSTOLIC DYSFUNCTION WITH MODERATE LVH  NORMAL RIGHT VENTRICULAR SYSTOLIC FUNCTION  MILD VALVULAR REGURGITATION   TRIVIAL PERICARDIAL EFFUSION  NO VALVULAR STENOSIS  MILD MR, TR  EF 45%   ECG 12/31/21:  Sinus rhythm Ventricular premature complex Borderline intraventricular conduction delay Borderline repol abnormality, lateral leads Borderline prolonged QT interval Baseline wander in lead(s) II III   Neuro/Psych   Neuromuscular disease (neuropathy)    GI/Hepatic negative GI ROS,,,  Endo/Other  diabetes, Type 2, Insulin Dependent  Class 3 obesity  Renal/GU Renal disease (nephrolithiasis; AKI on stage IV CKD)     Musculoskeletal  (+) Arthritis ,    Abdominal   Peds  Hematology  (+) Blood dyscrasia, anemia   Anesthesia Other Findings   Reproductive/Obstetrics                             Anesthesia Physical Anesthesia Plan  ASA: 3  Anesthesia Plan: General   Post-op Pain Management: Ofirmev IV (intra-op)*   Induction: Intravenous  PONV Risk Score and Plan: 2 and Ondansetron, Dexamethasone and Treatment may vary due to age or medical condition  Airway Management Planned: Oral ETT  Additional Equipment:   Intra-op Plan:   Post-operative Plan: Extubation in OR  Informed Consent: I have reviewed the patients History and Physical, chart, labs and discussed the procedure including the risks, benefits and alternatives for the proposed anesthesia with the patient or authorized representative who has indicated his/her understanding and acceptance.     Dental advisory given  Plan Discussed with: CRNA  Anesthesia Plan Comments: (Patient consented for risks of anesthesia including but not limited to:  - adverse reactions to medications - damage to eyes, teeth, lips or other oral mucosa - nerve damage due to positioning  - sore throat or hoarseness - damage to heart, brain, nerves, lungs, other parts of body or loss of life  Informed patient about role of CRNA in peri- and intra-operative care.  Patient voiced understanding.)        Anesthesia Quick Evaluation

## 2022-09-23 ENCOUNTER — Telehealth: Payer: Self-pay | Admitting: Family

## 2022-09-23 NOTE — Telephone Encounter (Signed)
Advised pt and wife to measure intake and output. Continue to log weights and monitor intake. Pt stated that he is not having changes in his output and drinking about 36oz. Pt has TED hose in place.  Pt stated holding Lasix the morning of his Fistula placement and resumed his evening dose that night. Pt stated taking Lasix '80mg'$  in the morning and '40mg'$  at night.  Pt stated that he is not having SOB, or feeling symptomatic with weight gain at this time.

## 2022-09-23 NOTE — Telephone Encounter (Signed)
Patient wife called in wanting to send a message to let Darylene Price know that patient had a fistula places on Thursday 09/18/2022. Patients breathing has been ok since then nothing to out of the normal. Patient's weight has been an issue since holding the lasix due to surgery    Before surgery weight was 307 Thursday  Friday weight was 308  Saturday weight was 310  Sunday and Today's weight was 312      Please call and advise. Patient just wanted to let Otila Kluver know what was going on to be on the safe side

## 2022-09-24 NOTE — Telephone Encounter (Signed)
Pt stated feeling pretty good today. Pt reports current weight is back to baseline of 308lb. Had an output of 2,200 cc since yesterday evening and is watching his intake. No further concerns or questions from pt at this time.

## 2022-09-27 ENCOUNTER — Telehealth (INDEPENDENT_AMBULATORY_CARE_PROVIDER_SITE_OTHER): Payer: Self-pay

## 2022-09-27 NOTE — Telephone Encounter (Signed)
If it is just the back of his hand, that is not uncommon.  However, if his entire hand is tingly/cold and or painful we should see him next week with HDA

## 2022-09-27 NOTE — Telephone Encounter (Signed)
Patient called about Fistula he had done 1 week ago and the back of his hand feels tingly like its half way asleep. He wants to know is that normal.  Please advise

## 2022-09-27 NOTE — Telephone Encounter (Signed)
Called patient, He said its no pain right now. Its just his hand and his thumb. I told him if anything changes over the week call Monday morning to make an Appt.

## 2022-09-30 NOTE — Telephone Encounter (Signed)
Patient called stating the back of his hand, 1st finger and thumb are now numb and sometimes feels like needles are sticking him. He was calling to set an appt as instructed by Arna Medici on Friday for an ultrasound.

## 2022-09-30 NOTE — Telephone Encounter (Signed)
He can come in with steal study see me or dew

## 2022-10-01 ENCOUNTER — Other Ambulatory Visit (INDEPENDENT_AMBULATORY_CARE_PROVIDER_SITE_OTHER): Payer: Self-pay | Admitting: Vascular Surgery

## 2022-10-01 DIAGNOSIS — N184 Chronic kidney disease, stage 4 (severe): Secondary | ICD-10-CM | POA: Diagnosis not present

## 2022-10-01 DIAGNOSIS — E1122 Type 2 diabetes mellitus with diabetic chronic kidney disease: Secondary | ICD-10-CM | POA: Diagnosis not present

## 2022-10-01 DIAGNOSIS — E1121 Type 2 diabetes mellitus with diabetic nephropathy: Secondary | ICD-10-CM | POA: Diagnosis not present

## 2022-10-01 DIAGNOSIS — Z794 Long term (current) use of insulin: Secondary | ICD-10-CM | POA: Diagnosis not present

## 2022-10-01 DIAGNOSIS — Z9889 Other specified postprocedural states: Secondary | ICD-10-CM

## 2022-10-01 DIAGNOSIS — N186 End stage renal disease: Secondary | ICD-10-CM

## 2022-10-02 ENCOUNTER — Ambulatory Visit (INDEPENDENT_AMBULATORY_CARE_PROVIDER_SITE_OTHER): Payer: PPO | Admitting: Nurse Practitioner

## 2022-10-02 ENCOUNTER — Encounter (INDEPENDENT_AMBULATORY_CARE_PROVIDER_SITE_OTHER): Payer: Self-pay | Admitting: Nurse Practitioner

## 2022-10-02 ENCOUNTER — Other Ambulatory Visit: Payer: Self-pay | Admitting: *Deleted

## 2022-10-02 ENCOUNTER — Ambulatory Visit (INDEPENDENT_AMBULATORY_CARE_PROVIDER_SITE_OTHER): Payer: PPO

## 2022-10-02 DIAGNOSIS — N186 End stage renal disease: Secondary | ICD-10-CM

## 2022-10-02 DIAGNOSIS — Z9889 Other specified postprocedural states: Secondary | ICD-10-CM

## 2022-10-02 DIAGNOSIS — N200 Calculus of kidney: Secondary | ICD-10-CM

## 2022-10-07 ENCOUNTER — Ambulatory Visit
Admission: RE | Admit: 2022-10-07 | Discharge: 2022-10-07 | Disposition: A | Discharge status: Home / Self Care | Payer: PPO | Attending: Urology | Admitting: Urology

## 2022-10-07 ENCOUNTER — Ambulatory Visit
Admission: RE | Admit: 2022-10-07 | Discharge: 2022-10-07 | Disposition: A | Discharge status: Home / Self Care | Payer: PPO | Source: Ambulatory Visit | Attending: Urology | Admitting: Urology

## 2022-10-07 DIAGNOSIS — N200 Calculus of kidney: Secondary | ICD-10-CM | POA: Insufficient documentation

## 2022-10-08 DIAGNOSIS — E1142 Type 2 diabetes mellitus with diabetic polyneuropathy: Secondary | ICD-10-CM | POA: Diagnosis not present

## 2022-10-08 DIAGNOSIS — Z794 Long term (current) use of insulin: Secondary | ICD-10-CM | POA: Diagnosis not present

## 2022-10-08 DIAGNOSIS — E1169 Type 2 diabetes mellitus with other specified complication: Secondary | ICD-10-CM | POA: Diagnosis not present

## 2022-10-08 DIAGNOSIS — E669 Obesity, unspecified: Secondary | ICD-10-CM | POA: Diagnosis not present

## 2022-10-08 DIAGNOSIS — E1122 Type 2 diabetes mellitus with diabetic chronic kidney disease: Secondary | ICD-10-CM | POA: Diagnosis not present

## 2022-10-08 DIAGNOSIS — E1165 Type 2 diabetes mellitus with hyperglycemia: Secondary | ICD-10-CM | POA: Diagnosis not present

## 2022-10-08 DIAGNOSIS — N184 Chronic kidney disease, stage 4 (severe): Secondary | ICD-10-CM | POA: Diagnosis not present

## 2022-10-08 DIAGNOSIS — E1121 Type 2 diabetes mellitus with diabetic nephropathy: Secondary | ICD-10-CM | POA: Diagnosis not present

## 2022-10-08 DIAGNOSIS — I10 Essential (primary) hypertension: Secondary | ICD-10-CM | POA: Diagnosis not present

## 2022-10-09 DIAGNOSIS — E1121 Type 2 diabetes mellitus with diabetic nephropathy: Secondary | ICD-10-CM | POA: Diagnosis not present

## 2022-10-11 ENCOUNTER — Encounter: Payer: Self-pay | Admitting: Urology

## 2022-10-11 ENCOUNTER — Ambulatory Visit: Payer: PPO | Admitting: Urology

## 2022-10-11 VITALS — BP 156/71 | HR 78 | Ht 70.0 in | Wt 310.0 lb

## 2022-10-11 DIAGNOSIS — N200 Calculus of kidney: Secondary | ICD-10-CM

## 2022-10-11 DIAGNOSIS — Z87442 Personal history of urinary calculi: Secondary | ICD-10-CM | POA: Diagnosis not present

## 2022-10-11 NOTE — Progress Notes (Signed)
I, DeAsia L Maxie,acting as a scribe for Abbie Sons, MD.,have documented all relevant documentation on the behalf of Abbie Sons, MD,as directed by  Abbie Sons, MD while in the presence of Abbie Sons, MD.   10/11/22 12:43 PM   James Clarke [birth year] [id number]  Referring provider: Sofie Hartigan, MD [place],  [place] [address]  Chief Complaint  Patient presents with   Nephrolithiasis   Urologic History: Recurrent stone disease -  s/p ureteroscopic removal of distal ureteral calculi x3 on 02/27/2022   HPI: James Clarke is a 82 y.o. male who returns for follow-up of nephrolithiasis.  PA visit 05/23/22 for bilateral flank pain x one week. A STAT CT was obtained which showed punctate right upper pole renal calculi, a right renal cyst, and no ureteral calculi or hydronephrosis. Doing well since last visit No bothersome LUTS Denies dysuria, gross hematuria Denies flank, abdominal or pelvic pain On Potassium Citrate KUB 10/07/22 personally reviewed and no calcifications identified overlying the renal outlines or expected course of the ureters.   PMH: Past Medical History:  Diagnosis Date   (HFpEF) heart failure with preserved ejection fraction ([place])    a.) TTE 11/26/2021: EF 45%, mod LVH, post HK, mod LAE, mild MR/TR, G2DD; b.) TTE 02/26/2022: EF 55-60%, BAE, triv MR, G2DD; c.) TTE 07/09/2022: EF 45%, post HK, LVH, mod LAE, triv TR/PR, mild MR, G1DD   Anemia of chronic renal failure    Aortic atherosclerosis (HCC)    Asthma    Bell's palsy    BPH (benign prostatic hyperplasia)    CAD (coronary artery disease)    a.) LHC 12/31/2021: 60% mLAD, 20% pRCA, 20% dRCA, 20% o-pLCx --> med mgmt.   CKD (chronic kidney disease), stage IV (HCC)    Diabetic neuropathy (HCC)    Dyspnea on exertion    Ganglion cyst of wrist, right    a.) s/p excision 04/2011   Hepatic steatosis    History of 2019 novel coronavirus disease (COVID-19)    History of  bilateral cataract extraction 2018   Hyperlipidemia    Hyperparathyroidism due to renal insufficiency ([place])    Hypertension    Low testosterone in male    Nephrolithiasis    Obesity    OSA treated with BiPAP    Osteoarthritis    Peripheral edema    Right inguinal hernia    a.) s/p repair   Skin cancer    Type 2 diabetes mellitus with renal manifestations [facility])     Surgical History: Past Surgical History:  Procedure Laterality Date   APPENDECTOMY     AV FISTULA PLACEMENT Left 09/19/2022   Procedure: ARTERIOVENOUS (AV) FISTULA CREATION (RADIO CEPHALIC);  Surgeon: Algernon Huxley, MD;  Location: [facility];  Service: Vascular;  Laterality: Left;   CATARACT EXTRACTION W/PHACO Left 05/20/2017   Procedure: CATARACT EXTRACTION PHACO AND INTRAOCULAR LENS PLACEMENT ([place]);  Surgeon: Birder Robson, MD;  Location: [facility];  Service: Ophthalmology;  Laterality: Left;  [place] 00:32.0 AP% 15.5 CDE 4.97 Fluid Pack Lot # [id number]   CATARACT EXTRACTION W/PHACO Right 06/10/2017   Procedure: CATARACT EXTRACTION PHACO AND INTRAOCULAR LENS PLACEMENT ([facility]);  Surgeon: Birder Robson, MD;  Location: [facility];  Service: Ophthalmology;  Laterality: Right;  [place]  00:51 AP% 15.4 CDE 7.95 Fluid pack lot # [id number]   CHOLECYSTECTOMY     COLONOSCOPY     x3   CYSTOSCOPY W/ RETROGRADES Right 08/13/2019   Procedure: CYSTOSCOPY WITH  RETROGRADE PYELOGRAM;  Surgeon: Billey Co, MD;  Location: [facility];  Service: Urology;  Laterality: Right;   CYSTOSCOPY WITH STENT PLACEMENT Right 07/26/2019   Procedure: CYSTOSCOPY WITH STENT PLACEMENT;  Surgeon: Lucas Mallow, MD;  Location: [facility];  Service: Urology;  Laterality: Right;   CYSTOSCOPY/URETEROSCOPY/HOLMIUM LASER/STENT PLACEMENT Right 08/13/2019   Procedure: CYSTOSCOPY/URETEROSCOPY/HOLMIUM LASER/STENT EXCHANGE;  Surgeon: Billey Co, MD;  Location: [facility];  Service: Urology;  Laterality: Right;   CYSTOSCOPY/URETEROSCOPY/HOLMIUM LASER/STENT PLACEMENT  Left 02/27/2022   Procedure: CYSTOSCOPY/URETEROSCOPY/HOLMIUM LASER/STENT PLACEMENT;  Surgeon: Abbie Sons, MD;  Location: [facility];  Service: Urology;  Laterality: Left;   GANGLION CYST EXCISION Right    INGUINAL HERNIA REPAIR Right    LEFT HEART CATH AND CORONARY ANGIOGRAPHY N/A 12/31/2021   Procedure: LEFT HEART CATH AND CORONARY ANGIOGRAPHY;  Surgeon: Corey Skains, MD;  Location: [facility];  Service: Cardiovascular;  Laterality: N/A;   SHOULDER ARTHROSCOPY WITH SUBACROMIAL DECOMPRESSION AND OPEN ROTATOR C Right 09/11/2020   Procedure: Right shoulder arthroscopic rotator cuff repair vs Regeneten patch application, subacromial decompression, and biceps tenodesis - Reche Dixon to Assist;  Surgeon: Leim Fabry, MD;  Location: [facility];  Service: Orthopedics;  Laterality: Right;   TEMPORARY DIALYSIS CATHETER N/A 03/05/2022   Procedure: TEMPORARY DIALYSIS CATHETER;  Surgeon: Katha Cabal, MD;  Location: [facility];  Service: Cardiovascular;  Laterality: N/A;   TONSILLECTOMY     URETEROSCOPY WITH HOLMIUM LASER LITHOTRIPSY Left 02/27/2022   Procedure: URETEROSCOPY WITH HOLMIUM LASER LITHOTRIPSY;  Surgeon: Abbie Sons, MD;  Location: [facility];  Service: Urology;  Laterality: Left;   WRIST FRACTURE SURGERY Right     Home Medications:  Allergies as of 10/11/2022       Reactions   Codeine Nausea And Vomiting   Doxycycline    Erythromycin Rash        Medication List        Accurate as of October 11, 2022 12:43 PM. If you have any questions, ask your nurse or doctor.          acetaminophen 500 MG tablet Commonly known as: TYLENOL Take 1,000 mg by mouth every 6 (six) hours as needed (shoulder pain).   albuterol 108 (90 Base) MCG/ACT inhaler Commonly known as: VENTOLIN HFA Inhale 2 puffs into the lungs every 6 (six) hours as needed for wheezing or shortness of breath.   AMLODIPINE BESYLATE PO Take 5 mg by mouth daily at 6 (six) AM.   aspirin  EC 81 MG tablet Take by mouth.   calcitRIOL 0.25 MCG capsule Commonly known as: ROCALTROL Take 0.25 mcg by mouth daily.   cetirizine 10 MG chewable tablet Commonly known as: ZYRTEC Chew 10 mg by mouth daily as needed for allergies.   fluticasone 50 MCG/ACT nasal spray Commonly known as: FLONASE as needed.   FreeStyle Libre 14 Day Sensor Misc by Does not apply route.   furosemide 40 MG tablet Commonly known as: LASIX Take 80 mg q am and 40 mg qpm   Global Inject Ease Insulin Syr 31G X 5/16" 0.5 ML Misc Generic drug: Insulin Syringe-Needle U-100 Inject into the skin.   hydrALAZINE 50 MG tablet Commonly known as: APRESOLINE Take 50 mg by mouth 3 (three) times daily.   insulin aspart 100 UNIT/ML injection Commonly known as: novoLOG Inject 30 units before breakfast and lunch. Inject 55 units before supper.   insulin detemir 100 UNIT/ML injection Commonly known as: LEVEMIR Inject 0.5 mLs (50  Units total) into the skin at bedtime.   isosorbide mononitrate 30 MG 24 hr tablet Commonly known as: IMDUR Take 1 tablet (30 mg total) by mouth daily.   lovastatin 40 MG tablet Commonly known as: MEVACOR Take 40 mg by mouth daily with supper.   metoprolol succinate 50 MG 24 hr tablet Commonly known as: TOPROL-XL Take 50 mg by mouth daily.   nitroGLYCERIN 0.4 MG SL tablet Commonly known as: NITROSTAT Place 1 tablet (0.4 mg total) under the tongue every 5 (five) minutes as needed for chest pain.   OneTouch Delica Plus [id number] Misc USE 1 LANCET 4 TIMES DAILY   OneTouch Ultra test strip Generic drug: glucose blood TEST BLOOD SUGAR 4 TIMES DAILY   potassium citrate 10 MEQ (1080 MG) SR tablet Commonly known as: UROCIT-K Take 10 mEq by mouth in the morning and at bedtime.   tamsulosin 0.4 MG Caps capsule Commonly known as: FLOMAX TAKE (1) CAPSULE BY MOUTH EVERY DAY   traMADol 50 MG tablet Commonly known as: ULTRAM Take 1 tablet (50 mg total) by mouth every 12 (twelve)  hours as needed.        Allergies:  Allergies  Allergen Reactions   Codeine Nausea And Vomiting   Doxycycline    Erythromycin Rash    Family History: Family History  Problem Relation Age of Onset   Emphysema Mother    COPD Mother    Heart disease Mother    Brain cancer Father     Social History:  reports that he has never smoked. He has never been exposed to tobacco smoke. He has never used smokeless tobacco. He reports that he does not currently use drugs. He reports that he does not drink alcohol.   Physical Exam: BP (!) 156/71   Pulse 78   Ht 5\' 10"  (1.778 m)   Wt (!) 310 lb (140.6 kg)   BMI 44.48 kg/m   Constitutional:  Alert, No acute distress. HEENT: [place] AT Respiratory: Normal respiratory effort, no increased work of breathing. GI: Abdomen is soft, nontender, nondistended, no abdominal masses Skin: No rashes, bruises or suspicious lesions. Neurologic: Grossly intact, no focal deficits, moving all 4 extremities. Psychiatric: Normal mood and affect.   Pertinent Imaging: KUB personally reviewed and interpreted.  Abdomen 1 view (KUB)  Narrative CLINICAL DATA:  History of kidney stones.  EXAM: ABDOMEN - 1 VIEW  COMPARISON:  Renal CT 05/23/2022.  FINDINGS: Large amount of stool noted throughout colon make evaluation for renal stone disease difficult. Previously noted small right renal stones are difficult to visualized by plain film exam. Pelvic calcifications again noted consistent phleboliths. Degenerative changes lumbar spine and both hips. Bibasilar pulmonary interstitial disease again noted.  IMPRESSION: Large amount of stool noted throughout colon making evaluation for renal stone disease difficult. Previously noted small right renal stones are difficult to visualized by plain film exam.   Electronically Signed By: Marcello Moores  Register M.D. On: 10/08/2022 09:32   Assessment & Plan:    Recurrent nephrolithiasis Asymptomatic KUB today  negative Continue Potassium Citrate   I have reviewed the above documentation for accuracy and completeness, and I agree with the above.   Abbie Sons, [place] [address], [place], [place] [address] [phone]

## 2022-10-13 ENCOUNTER — Encounter: Payer: Self-pay | Admitting: Urology

## 2022-10-15 ENCOUNTER — Encounter (INDEPENDENT_AMBULATORY_CARE_PROVIDER_SITE_OTHER): Payer: Self-pay | Admitting: Nurse Practitioner

## 2022-10-15 ENCOUNTER — Telehealth (INDEPENDENT_AMBULATORY_CARE_PROVIDER_SITE_OTHER): Payer: Self-pay

## 2022-10-15 NOTE — Progress Notes (Signed)
Subjective:    Patient ID: James Clarke, male    DOB: [birth year], 82 y.o.   MRN: [id number] Chief Complaint  Patient presents with   Follow-up    Follow up tingling in left  hand eval for still     James Clarke is an 82 year old male who recently underwent radiocephalic AV fistula placement of his left upper extremity on 09/19/2022.  He notes that he has been having some numbness and tingling in the back portion of his hand.  He is also concerned due to what appears to be some swelling around the wound itself.  Otherwise he does not have any open wounds or ulcerations.  Denies any oozing or bleeding.  Today noninvasive studies show flow volume of 1477 in the left radiocephalic AV fistula.  There is no evidence of steal noted today.  Ephriam Jenkins or clotting noted underneath the access.    Review of Systems  Neurological:  Positive for numbness.  All other systems reviewed and are negative.      Objective:   Physical Exam Vitals reviewed.  HENT:     Head: Normocephalic.  Cardiovascular:     Rate and Rhythm: Normal rate.     Pulses:          Radial pulses are 2+ on the left side.  Pulmonary:     Effort: Pulmonary effort is normal.  Skin:    General: Skin is warm and dry.  Neurological:     Mental Status: He is alert and oriented to person, place, and time.  Psychiatric:        Mood and Affect: Mood normal.        Behavior: Behavior normal.        Thought Content: Thought content normal.        Judgment: Judgment normal.     BP (!) 149/75 (BP Location: Right Arm)   Pulse 67   Resp 18   Ht 5\' 10"  (1.778 m)   Wt (!) 311 lb (141.1 kg)   BMI 44.62 kg/m   Past Medical History:  Diagnosis Date   (HFpEF) heart failure with preserved ejection fraction (HCC)    a.) TTE 11/26/2021: EF 45%, mod LVH, post HK, mod LAE, mild MR/TR, G2DD; b.) TTE 02/26/2022: EF 55-60%, BAE, triv MR, G2DD; c.) TTE 07/09/2022: EF 45%, post HK, LVH, mod LAE, triv TR/PR, mild MR, G1DD   Anemia of chronic  renal failure    Aortic atherosclerosis (HCC)    Asthma    Bell's palsy    BPH (benign prostatic hyperplasia)    CAD (coronary artery disease)    a.) LHC 12/31/2021: 60% mLAD, 20% pRCA, 20% dRCA, 20% o-pLCx --> med mgmt.   CKD (chronic kidney disease), stage IV (HCC)    Diabetic neuropathy (HCC)    Dyspnea on exertion    Ganglion cyst of wrist, right    a.) s/p excision 04/2011   Hepatic steatosis    History of 2019 novel coronavirus disease (COVID-19)    History of bilateral cataract extraction 2018   Hyperlipidemia    Hyperparathyroidism due to renal insufficiency ([place])    Hypertension    Low testosterone in male    Nephrolithiasis    Obesity    OSA treated with BiPAP    Osteoarthritis    Peripheral edema    Right inguinal hernia    a.) s/p repair   Skin cancer    Type 2 diabetes mellitus with renal manifestations ([place])  Social History   Socioeconomic History   Marital status: Married    Spouse name: Not on file   Number of children: Not on file   Years of education: Not on file   Highest education level: Not on file  Occupational History   Not on file  Tobacco Use   Smoking status: Never    Passive exposure: Never   Smokeless tobacco: Never  Vaping Use   Vaping Use: Never used  Substance and Sexual Activity   Alcohol use: No   Drug use: Not Currently   Sexual activity: Yes    Birth control/protection: None  Other Topics Concern   Not on file  Social History Narrative   Not on file   Social Determinants of Health   Financial Resource Strain: Not on file  Food Insecurity: No Food Insecurity (06/12/2022)   Hunger Vital Sign    Worried About Running Out of Food in the Last Year: Never true    Ran Out of Food in the Last Year: Never true  Transportation Needs: No Transportation Needs (06/12/2022)   PRAPARE - Hydrologist (Medical): No    Lack of Transportation (Non-Medical): No  Physical Activity: Not on file  Stress:  Not on file  Social Connections: Not on file  Intimate Partner Violence: Not At Risk (06/12/2022)   Humiliation, Afraid, Rape, and Kick questionnaire    Fear of Current or Ex-Partner: No    Emotionally Abused: No    Physically Abused: No    Sexually Abused: No    Past Surgical History:  Procedure Laterality Date   APPENDECTOMY     AV FISTULA PLACEMENT Left 09/19/2022   Procedure: ARTERIOVENOUS (AV) FISTULA CREATION (RADIO CEPHALIC);  Surgeon: Algernon Huxley, MD;  Location: [facility];  Service: Vascular;  Laterality: Left;   CATARACT EXTRACTION W/PHACO Left 05/20/2017   Procedure: CATARACT EXTRACTION PHACO AND INTRAOCULAR LENS PLACEMENT ([place]);  Surgeon: Birder Robson, MD;  Location: [facility];  Service: Ophthalmology;  Laterality: Left;  [place] 00:32.0 AP% 15.5 CDE 4.97 Fluid Pack Lot # [id number]   CATARACT EXTRACTION W/PHACO Right 06/10/2017   Procedure: CATARACT EXTRACTION PHACO AND INTRAOCULAR LENS PLACEMENT (IOC);  Surgeon: Birder Robson, MD;  Location: [facility];  Service: Ophthalmology;  Laterality: Right;  [place]  00:51 AP% 15.4 CDE 7.95 Fluid pack lot # [id number]   CHOLECYSTECTOMY     COLONOSCOPY     x3   CYSTOSCOPY W/ RETROGRADES Right 08/13/2019   Procedure: CYSTOSCOPY WITH RETROGRADE PYELOGRAM;  Surgeon: Billey Co, MD;  Location: [facility];  Service: Urology;  Laterality: Right;   CYSTOSCOPY WITH STENT PLACEMENT Right 07/26/2019   Procedure: CYSTOSCOPY WITH STENT PLACEMENT;  Surgeon: Lucas Mallow, MD;  Location: [facility];  Service: Urology;  Laterality: Right;   CYSTOSCOPY/URETEROSCOPY/HOLMIUM LASER/STENT PLACEMENT Right 08/13/2019   Procedure: CYSTOSCOPY/URETEROSCOPY/HOLMIUM LASER/STENT EXCHANGE;  Surgeon: Billey Co, MD;  Location: [facility];  Service: Urology;  Laterality: Right;   CYSTOSCOPY/URETEROSCOPY/HOLMIUM LASER/STENT PLACEMENT Left 02/27/2022   Procedure: CYSTOSCOPY/URETEROSCOPY/HOLMIUM LASER/STENT PLACEMENT;  Surgeon: Abbie Sons, MD;  Location:  [facility];  Service: Urology;  Laterality: Left;   GANGLION CYST EXCISION Right    INGUINAL HERNIA REPAIR Right    LEFT HEART CATH AND CORONARY ANGIOGRAPHY N/A 12/31/2021   Procedure: LEFT HEART CATH AND CORONARY ANGIOGRAPHY;  Surgeon: Corey Skains, MD;  Location: [facility];  Service: Cardiovascular;  Laterality: N/A;   SHOULDER ARTHROSCOPY WITH SUBACROMIAL DECOMPRESSION AND OPEN ROTATOR  C Right 09/11/2020   Procedure: Right shoulder arthroscopic rotator cuff repair vs Regeneten patch application, subacromial decompression, and biceps tenodesis - Reche Dixon to Assist;  Surgeon: Leim Fabry, MD;  Location: [facility];  Service: Orthopedics;  Laterality: Right;   TEMPORARY DIALYSIS CATHETER N/A 03/05/2022   Procedure: TEMPORARY DIALYSIS CATHETER;  Surgeon: Katha Cabal, MD;  Location: [place] [facility];  Service: Cardiovascular;  Laterality: N/A;   TONSILLECTOMY     URETEROSCOPY WITH HOLMIUM LASER LITHOTRIPSY Left 02/27/2022   Procedure: URETEROSCOPY WITH HOLMIUM LASER LITHOTRIPSY;  Surgeon: Abbie Sons, MD;  Location: [facility];  Service: Urology;  Laterality: Left;   WRIST FRACTURE SURGERY Right     Family History  Problem Relation Age of Onset   Emphysema Mother    COPD Mother    Heart disease Mother    Brain cancer Father     Allergies  Allergen Reactions   Codeine Nausea And Vomiting   Doxycycline    Erythromycin Rash       Latest Ref Rng & Units 09/19/2022    6:36 AM 09/10/2022   10:22 AM 06/18/2022    5:54 AM  CBC  WBC 4.0 - 10.5 K/uL  10.6    Hemoglobin 13.0 - 17.0 g/dL 10.9  11.5  10.0   Hematocrit 39.0 - 52.0 % 32.0  37.0    Platelets 150 - 400 K/uL  313        CMP     Component Value Date/Time   NA 140 09/19/2022 0636   NA 141 04/10/2022 0000   NA 137 11/27/2013 0412   K 3.8 09/19/2022 0636   K 3.5 11/27/2013 0412   CL 102 09/19/2022 0636   CL 105 11/27/2013 0412   CO2 30 09/10/2022 1022   CO2 25 11/27/2013 0412   GLUCOSE 289  (H) 09/19/2022 0636   GLUCOSE 98 11/27/2013 0412   BUN 71 (H) 09/19/2022 0636   BUN 42 (H) 11/27/2013 0412   CREATININE 3.60 (H) 09/19/2022 0636   CREATININE 2.14 (H) 11/27/2013 0412   CALCIUM 8.8 (L) 09/10/2022 1022   CALCIUM 7.6 (L) 11/27/2013 0412   PROT 7.0 02/26/2022 0948   PROT 7.4 11/27/2013 0412   ALBUMIN 2.8 (L) 03/11/2022 0335   ALBUMIN 3.1 (L) 11/27/2013 0412   AST 21 02/26/2022 0948   AST 47 (H) 11/27/2013 0412   ALT 15 02/26/2022 0948   ALT 41 11/27/2013 0412   ALKPHOS 69 02/26/2022 0948   ALKPHOS 82 11/27/2013 0412   BILITOT 0.7 02/26/2022 0948   BILITOT 0.4 11/27/2013 0412   GFRNONAA 18 (L) 09/10/2022 1022   GFRNONAA 30 (L) 11/27/2013 0412   GFRAA 30 (L) 03/13/2020 1531   GFRAA 34 (L) 11/27/2013 0412     No results found.     Assessment & Plan:   1. ESRD (end stage renal disease) ([place]) Today the patient's numbness and tingling is likely a standard change following the radiocephalic AV fistula placement.  We discussed that in some cases nerves were dissected during the surgery which can result in some numbness and tingling in the fingertips.  There are some swelling underneath the incision line but this is also normal and not a cause for concern at this time.  He has a strong thrill and at this time that has a good flow volume.  No evidence of steal was noted today.  Will have the patient return in approximately 5 weeks or so to reevaluate access prior to use. -  VAS US DUPLEX DIALYSIS ACCESS (AVF, AVG)   Current Outpatient Medications on File Prior to Visit  Medication Sig Dispense Refill   acetaminophen (TYLENOL) 500 MG tablet Take 1,000 mg by mouth every 6 (six) hours as needed (shoulder pain).     albuterol (PROVENTIL HFA;VENTOLIN HFA) 108 (90 Base) MCG/ACT inhaler Inhale 2 puffs into the lungs every 6 (six) hours as needed for wheezing or shortness of breath.     AMLODIPINE BESYLATE PO Take 5 mg by mouth daily at 6 (six) AM.     aspirin EC 81 MG tablet Take  by mouth.     calcitRIOL (ROCALTROL) 0.25 MCG capsule Take 0.25 mcg by mouth daily.     cetirizine (ZYRTEC) 10 MG chewable tablet Chew 10 mg by mouth daily as needed for allergies.     Continuous Blood Gluc Sensor (FREESTYLE LIBRE 14 DAY SENSOR) MISC by Does not apply route.     fluticasone (FLONASE) 50 MCG/ACT nasal spray as needed.     furosemide (LASIX) 40 MG tablet Take 80 mg q am and 40 mg qpm 60 tablet 2   GLOBAL INJECT EASE INSULIN SYR 31G X 5/16" 0.5 ML MISC Inject into the skin.     glucose blood (ONETOUCH ULTRA) test strip TEST BLOOD SUGAR 4 TIMES DAILY     hydrALAZINE (APRESOLINE) 50 MG tablet Take 50 mg by mouth 3 (three) times daily.     insulin aspart (NOVOLOG) 100 UNIT/ML injection Inject 30 units before breakfast and lunch. Inject 55 units before supper.     insulin detemir (LEVEMIR) 100 UNIT/ML injection Inject 0.5 mLs (50 Units total) into the skin at bedtime. 10 mL 1   isosorbide mononitrate (IMDUR) 30 MG 24 hr tablet Take 1 tablet (30 mg total) by mouth daily. 30 tablet 2   Lancets (ONETOUCH DELICA PLUS Q000111Q) MISC USE 1 LANCET 4 TIMES DAILY     lovastatin (MEVACOR) 40 MG tablet Take 40 mg by mouth daily with supper.     metoprolol succinate (TOPROL-XL) 50 MG 24 hr tablet Take 50 mg by mouth daily.     nitroGLYCERIN (NITROSTAT) 0.4 MG SL tablet Place 1 tablet (0.4 mg total) under the tongue every 5 (five) minutes as needed for chest pain. 20 tablet 1   potassium citrate (UROCIT-K) 10 MEQ (1080 MG) SR tablet Take 10 mEq by mouth in the morning and at bedtime.     tamsulosin (FLOMAX) 0.4 MG CAPS capsule TAKE (1) CAPSULE BY MOUTH EVERY DAY 30 capsule 1   traMADol (ULTRAM) 50 MG tablet Take 1 tablet (50 mg total) by mouth every 12 (twelve) hours as needed. 30 tablet 1   No current facility-administered medications on file prior to visit.    There are no Patient Instructions on file for this visit. No follow-ups on file.   Kris Hartmann, NP

## 2022-10-15 NOTE — Telephone Encounter (Signed)
Patient called to ask if he could start back wearing his watch after having his fistula done on 09/19/22. Dr. Lucky Cowboy said it was fine. Patient was informed he could wear his watch.

## 2022-10-19 ENCOUNTER — Other Ambulatory Visit: Payer: Self-pay | Admitting: Urology

## 2022-10-22 DIAGNOSIS — N184 Chronic kidney disease, stage 4 (severe): Secondary | ICD-10-CM | POA: Diagnosis not present

## 2022-10-22 DIAGNOSIS — E1122 Type 2 diabetes mellitus with diabetic chronic kidney disease: Secondary | ICD-10-CM | POA: Diagnosis not present

## 2022-10-24 DIAGNOSIS — G4733 Obstructive sleep apnea (adult) (pediatric): Secondary | ICD-10-CM | POA: Diagnosis not present

## 2022-10-30 DIAGNOSIS — I10 Essential (primary) hypertension: Secondary | ICD-10-CM | POA: Diagnosis not present

## 2022-10-30 DIAGNOSIS — E1122 Type 2 diabetes mellitus with diabetic chronic kidney disease: Secondary | ICD-10-CM | POA: Diagnosis not present

## 2022-10-30 DIAGNOSIS — N2581 Secondary hyperparathyroidism of renal origin: Secondary | ICD-10-CM | POA: Diagnosis not present

## 2022-10-30 DIAGNOSIS — D631 Anemia in chronic kidney disease: Secondary | ICD-10-CM | POA: Diagnosis not present

## 2022-10-30 DIAGNOSIS — N184 Chronic kidney disease, stage 4 (severe): Secondary | ICD-10-CM | POA: Diagnosis not present

## 2022-10-30 DIAGNOSIS — R600 Localized edema: Secondary | ICD-10-CM | POA: Diagnosis not present

## 2022-10-30 DIAGNOSIS — R809 Proteinuria, unspecified: Secondary | ICD-10-CM | POA: Diagnosis not present

## 2022-10-31 ENCOUNTER — Encounter (INDEPENDENT_AMBULATORY_CARE_PROVIDER_SITE_OTHER): Payer: PPO

## 2022-10-31 ENCOUNTER — Ambulatory Visit (INDEPENDENT_AMBULATORY_CARE_PROVIDER_SITE_OTHER): Payer: PPO | Admitting: Nurse Practitioner

## 2022-11-09 DIAGNOSIS — E1121 Type 2 diabetes mellitus with diabetic nephropathy: Secondary | ICD-10-CM | POA: Diagnosis not present

## 2022-11-11 DIAGNOSIS — R6889 Other general symptoms and signs: Secondary | ICD-10-CM | POA: Diagnosis not present

## 2022-11-11 DIAGNOSIS — E7800 Pure hypercholesterolemia, unspecified: Secondary | ICD-10-CM | POA: Diagnosis not present

## 2022-11-11 DIAGNOSIS — I10 Essential (primary) hypertension: Secondary | ICD-10-CM | POA: Diagnosis not present

## 2022-11-11 DIAGNOSIS — N2581 Secondary hyperparathyroidism of renal origin: Secondary | ICD-10-CM | POA: Diagnosis not present

## 2022-11-11 DIAGNOSIS — I509 Heart failure, unspecified: Secondary | ICD-10-CM | POA: Diagnosis not present

## 2022-11-11 DIAGNOSIS — E1122 Type 2 diabetes mellitus with diabetic chronic kidney disease: Secondary | ICD-10-CM | POA: Diagnosis not present

## 2022-11-11 DIAGNOSIS — I700 Atherosclerosis of aorta: Secondary | ICD-10-CM | POA: Diagnosis not present

## 2022-11-11 DIAGNOSIS — J309 Allergic rhinitis, unspecified: Secondary | ICD-10-CM | POA: Diagnosis not present

## 2022-11-11 DIAGNOSIS — M25511 Pain in right shoulder: Secondary | ICD-10-CM | POA: Diagnosis not present

## 2022-11-11 DIAGNOSIS — Z794 Long term (current) use of insulin: Secondary | ICD-10-CM | POA: Diagnosis not present

## 2022-11-11 DIAGNOSIS — G4733 Obstructive sleep apnea (adult) (pediatric): Secondary | ICD-10-CM | POA: Diagnosis not present

## 2022-11-11 DIAGNOSIS — E1140 Type 2 diabetes mellitus with diabetic neuropathy, unspecified: Secondary | ICD-10-CM | POA: Diagnosis not present

## 2022-11-11 DIAGNOSIS — Z0000 Encounter for general adult medical examination without abnormal findings: Secondary | ICD-10-CM | POA: Diagnosis not present

## 2022-11-11 DIAGNOSIS — N184 Chronic kidney disease, stage 4 (severe): Secondary | ICD-10-CM | POA: Diagnosis not present

## 2022-11-12 DIAGNOSIS — R809 Proteinuria, unspecified: Secondary | ICD-10-CM | POA: Diagnosis not present

## 2022-11-12 DIAGNOSIS — N184 Chronic kidney disease, stage 4 (severe): Secondary | ICD-10-CM | POA: Diagnosis not present

## 2022-11-18 ENCOUNTER — Other Ambulatory Visit (INDEPENDENT_AMBULATORY_CARE_PROVIDER_SITE_OTHER): Payer: Self-pay | Admitting: Nurse Practitioner

## 2022-11-18 DIAGNOSIS — N186 End stage renal disease: Secondary | ICD-10-CM

## 2022-11-19 DIAGNOSIS — I2510 Atherosclerotic heart disease of native coronary artery without angina pectoris: Secondary | ICD-10-CM | POA: Diagnosis not present

## 2022-11-19 DIAGNOSIS — N184 Chronic kidney disease, stage 4 (severe): Secondary | ICD-10-CM | POA: Diagnosis not present

## 2022-11-19 DIAGNOSIS — E1122 Type 2 diabetes mellitus with diabetic chronic kidney disease: Secondary | ICD-10-CM | POA: Diagnosis not present

## 2022-11-19 DIAGNOSIS — E7800 Pure hypercholesterolemia, unspecified: Secondary | ICD-10-CM | POA: Diagnosis not present

## 2022-11-19 DIAGNOSIS — G4733 Obstructive sleep apnea (adult) (pediatric): Secondary | ICD-10-CM | POA: Diagnosis not present

## 2022-11-19 DIAGNOSIS — Z794 Long term (current) use of insulin: Secondary | ICD-10-CM | POA: Diagnosis not present

## 2022-11-19 DIAGNOSIS — I10 Essential (primary) hypertension: Secondary | ICD-10-CM | POA: Diagnosis not present

## 2022-11-19 DIAGNOSIS — I700 Atherosclerosis of aorta: Secondary | ICD-10-CM | POA: Diagnosis not present

## 2022-11-19 DIAGNOSIS — I5032 Chronic diastolic (congestive) heart failure: Secondary | ICD-10-CM | POA: Diagnosis not present

## 2022-11-19 DIAGNOSIS — R600 Localized edema: Secondary | ICD-10-CM | POA: Diagnosis not present

## 2022-11-21 ENCOUNTER — Ambulatory Visit (INDEPENDENT_AMBULATORY_CARE_PROVIDER_SITE_OTHER): Payer: PPO | Admitting: Nurse Practitioner

## 2022-11-21 ENCOUNTER — Ambulatory Visit (INDEPENDENT_AMBULATORY_CARE_PROVIDER_SITE_OTHER): Payer: PPO

## 2022-11-21 ENCOUNTER — Encounter (INDEPENDENT_AMBULATORY_CARE_PROVIDER_SITE_OTHER): Payer: Self-pay | Admitting: Nurse Practitioner

## 2022-11-21 VITALS — BP 154/72 | HR 77 | Resp 16 | Wt 317.0 lb

## 2022-11-21 DIAGNOSIS — E785 Hyperlipidemia, unspecified: Secondary | ICD-10-CM

## 2022-11-21 DIAGNOSIS — N184 Chronic kidney disease, stage 4 (severe): Secondary | ICD-10-CM

## 2022-11-21 DIAGNOSIS — N186 End stage renal disease: Secondary | ICD-10-CM | POA: Diagnosis not present

## 2022-11-21 DIAGNOSIS — I10 Essential (primary) hypertension: Secondary | ICD-10-CM

## 2022-11-21 NOTE — Progress Notes (Incomplete)
Subjective:    Patient ID: James Clarke, male    DOB: 01/12/1941, 82 y.o.   MRN: [id number] Chief Complaint  Patient presents with  . Follow-up    6 week HDA    HPI  Review of Systems  Constitutional:  Positive for fatigue.  Cardiovascular:  Negative for leg swelling.  All other systems reviewed and are negative.      Objective:   Physical Exam Vitals reviewed.  HENT:     Head: Normocephalic.  Cardiovascular:     Rate and Rhythm: Normal rate.     Pulses:          Radial pulses are 1+ on the left side.     Arteriovenous access: Left arteriovenous access is present.    Comments: Good thrill and bruit but not easily detected Pulmonary:     Effort: Pulmonary effort is normal.  Skin:    General: Skin is warm and dry.  Neurological:     Mental Status: He is alert and oriented to person, place, and time.  Psychiatric:        Mood and Affect: Mood normal.        Behavior: Behavior normal.        Thought Content: Thought content normal.        Judgment: Judgment normal.     BP (!) 154/72 (BP Location: Right Arm)   Pulse 77   Resp 16   Wt (!) 317 lb (143.8 kg)   BMI 45.48 kg/m   Past Medical History:  Diagnosis Date  . (HFpEF) heart failure with preserved ejection fraction (HCC)    a.) TTE 11/26/2021: EF 45%, mod LVH, post HK, mod LAE, mild MR/TR, G2DD; b.) TTE 02/26/2022: EF 55-60%, BAE, triv MR, G2DD; c.) TTE 07/09/2022: EF 45%, post HK, LVH, mod LAE, triv TR/PR, mild MR, G1DD  . Anemia of chronic renal failure   . Aortic atherosclerosis (HCC)   . Asthma   . Bell's palsy   . BPH (benign prostatic hyperplasia)   . CAD (coronary artery disease)    a.) LHC 12/31/2021: 60% mLAD, 20% pRCA, 20% dRCA, 20% o-pLCx --> med mgmt.  . CKD (chronic kidney disease), stage IV (HCC)   . Diabetic neuropathy (HCC)   . Dyspnea on exertion   . Ganglion cyst of wrist, right    a.) s/p excision 04/2011  . Hepatic steatosis   . History of 2019 novel coronavirus disease  (COVID-19)   . History of bilateral cataract extraction 2018  . Hyperlipidemia   . Hyperparathyroidism due to renal insufficiency (HCC)   . Hypertension   . Low testosterone in male   . Nephrolithiasis   . Obesity   . OSA treated with BiPAP   . Osteoarthritis   . Peripheral edema   . Right inguinal hernia    a.) s/p repair  . Skin cancer   . Type 2 diabetes mellitus with renal manifestations [facility])     Social History   Socioeconomic History  . Marital status: Married    Spouse name: Not on file  . Number of children: Not on file  . Years of education: Not on file  . Highest education level: Not on file  Occupational History  . Not on file  Tobacco Use  . Smoking status: Never    Passive exposure: Never  . Smokeless tobacco: Never  Vaping Use  . Vaping Use: Never used  Substance and Sexual Activity  . Alcohol use: No  .  Drug use: Not Currently  . Sexual activity: Yes    Birth control/protection: None  Other Topics Concern  . Not on file  Social History Narrative  . Not on file   Social Determinants of Health   Financial Resource Strain: Not on file  Food Insecurity: No Food Insecurity (06/12/2022)   Hunger Vital Sign   . Worried About Programme researcher, broadcasting/film/video in the Last Year: Never true   . Ran Out of Food in the Last Year: Never true  Transportation Needs: No Transportation Needs (06/12/2022)   PRAPARE - Transportation   . Lack of Transportation (Medical): No   . Lack of Transportation (Non-Medical): No  Physical Activity: Not on file  Stress: Not on file  Social Connections: Not on file  Intimate Partner Violence: Not At Risk (06/12/2022)   Humiliation, Afraid, Rape, and Kick questionnaire   . Fear of Current or Ex-Partner: No   . Emotionally Abused: No   . Physically Abused: No   . Sexually Abused: No    Past Surgical History:  Procedure Laterality Date  . APPENDECTOMY    . AV FISTULA PLACEMENT Left 09/19/2022   Procedure: ARTERIOVENOUS (AV) FISTULA  CREATION (RADIO CEPHALIC);  Surgeon: Annice Needy, MD;  Location: [facility];  Service: Vascular;  Laterality: Left;  . CATARACT EXTRACTION W/PHACO Left 05/20/2017   Procedure: CATARACT EXTRACTION PHACO AND INTRAOCULAR LENS PLACEMENT (IOC);  Surgeon: Galen Manila, MD;  Location: [facility];  Service: Ophthalmology;  Laterality: Left;  [place] 00:32.0 AP% 15.5 CDE 4.97 Fluid Pack Lot # [id number]  . CATARACT EXTRACTION W/PHACO Right 06/10/2017   Procedure: CATARACT EXTRACTION PHACO AND INTRAOCULAR LENS PLACEMENT (IOC);  Surgeon: Galen Manila, MD;  Location: [facility];  Service: Ophthalmology;  Laterality: Right;  [place]  00:51 AP% 15.4 CDE 7.95 Fluid pack lot # [id number]  . CHOLECYSTECTOMY    . COLONOSCOPY     x3  . CYSTOSCOPY W/ RETROGRADES Right 08/13/2019   Procedure: CYSTOSCOPY WITH RETROGRADE PYELOGRAM;  Surgeon: Sondra Come, MD;  Location: [facility];  Service: Urology;  Laterality: Right;  . CYSTOSCOPY WITH STENT PLACEMENT Right 07/26/2019   Procedure: CYSTOSCOPY WITH STENT PLACEMENT;  Surgeon: Crista Elliot, MD;  Location: [facility];  Service: Urology;  Laterality: Right;  . CYSTOSCOPY/URETEROSCOPY/HOLMIUM LASER/STENT PLACEMENT Right 08/13/2019   Procedure: CYSTOSCOPY/URETEROSCOPY/HOLMIUM LASER/STENT EXCHANGE;  Surgeon: Sondra Come, MD;  Location: [facility];  Service: Urology;  Laterality: Right;  . CYSTOSCOPY/URETEROSCOPY/HOLMIUM LASER/STENT PLACEMENT Left 02/27/2022   Procedure: CYSTOSCOPY/URETEROSCOPY/HOLMIUM LASER/STENT PLACEMENT;  Surgeon: Riki Altes, MD;  Location: [facility];  Service: Urology;  Laterality: Left;  . GANGLION CYST EXCISION Right   . INGUINAL HERNIA REPAIR Right   . LEFT HEART CATH AND CORONARY ANGIOGRAPHY N/A 12/31/2021   Procedure: LEFT HEART CATH AND CORONARY ANGIOGRAPHY;  Surgeon: Lamar Blinks, MD;  Location: [facility];  Service: Cardiovascular;  Laterality: N/A;  . SHOULDER ARTHROSCOPY WITH SUBACROMIAL DECOMPRESSION AND OPEN ROTATOR  C Right 09/11/2020   Procedure: Right shoulder arthroscopic rotator cuff repair vs Regeneten patch application, subacromial decompression, and biceps tenodesis - Dedra Skeens to Assist;  Surgeon: Signa Kell, MD;  Location: [facility];  Service: Orthopedics;  Laterality: Right;  . TEMPORARY DIALYSIS CATHETER N/A 03/05/2022   Procedure: TEMPORARY DIALYSIS CATHETER;  Surgeon: Renford Dills, MD;  Location: [facility];  Service: Cardiovascular;  Laterality: N/A;  . TONSILLECTOMY    . URETEROSCOPY WITH HOLMIUM LASER LITHOTRIPSY Left 02/27/2022   Procedure: URETEROSCOPY WITH  HOLMIUM LASER LITHOTRIPSY;  Surgeon: Riki Altes, MD;  Location: [facility];  Service: Urology;  Laterality: Left;  . WRIST FRACTURE SURGERY Right     Family History  Problem Relation Age of Onset  . Emphysema Mother   . COPD Mother   . Heart disease Mother   . Brain cancer Father     Allergies  Allergen Reactions  . Codeine Nausea And Vomiting  . Doxycycline   . Erythromycin Rash       Latest Ref Rng & Units 09/19/2022    6:36 AM 09/10/2022   10:22 AM 06/18/2022    5:54 AM  CBC  WBC 4.0 - 10.5 K/uL  10.6    Hemoglobin 13.0 - 17.0 g/dL 69.6  29.5  28.4   Hematocrit 39.0 - 52.0 % 32.0  37.0    Platelets 150 - 400 K/uL  313        CMP     Component Value Date/Time   NA 140 09/19/2022 0636   NA 141 04/10/2022 0000   NA 137 11/27/2013 0412   K 3.8 09/19/2022 0636   K 3.5 11/27/2013 0412   CL 102 09/19/2022 0636   CL 105 11/27/2013 0412   CO2 30 09/10/2022 1022   CO2 25 11/27/2013 0412   GLUCOSE 289 (H) 09/19/2022 0636   GLUCOSE 98 11/27/2013 0412   BUN 71 (H) 09/19/2022 0636   BUN 42 (H) 11/27/2013 0412   CREATININE 3.60 (H) 09/19/2022 0636   CREATININE 2.14 (H) 11/27/2013 0412   CALCIUM 8.8 (L) 09/10/2022 1022   CALCIUM 7.6 (L) 11/27/2013 0412   PROT 7.0 02/26/2022 0948   PROT 7.4 11/27/2013 0412   ALBUMIN 2.8 (L) 03/11/2022 0335   ALBUMIN 3.1 (L) 11/27/2013 0412   AST 21  02/26/2022 0948   AST 47 (H) 11/27/2013 0412   ALT 15 02/26/2022 0948   ALT 41 11/27/2013 0412   ALKPHOS 69 02/26/2022 0948   ALKPHOS 82 11/27/2013 0412   BILITOT 0.7 02/26/2022 0948   BILITOT 0.4 11/27/2013 0412   GFRNONAA 18 (L) 09/10/2022 1022   GFRNONAA 30 (L) 11/27/2013 0412   GFRAA 30 (L) 03/13/2020 1531   GFRAA 34 (L) 11/27/2013 0412     No results found.     Assessment & Plan:   1. Chronic kidney disease (CKD), stage IV (severe) (HCC) The patient does have some numbness in his thumb and first finger but it does not extend to the fingertips.  Based on his description I do not believe this is still syndrome but rather numbness due to nerve dissection from the surgery.  As far as the fistula itself has a good thrill and bruit and today the studies indicate that there is no significant stenosis and a good flow volume however it is not easily palpable with this activity.  I am fearful that when he begins dialysis they will be able to locate this.  Will try to give the patient several more weeks to see if this matures naturally if not we may need to help with maturation via fistulogram.  2. Hyperlipidemia, unspecified hyperlipidemia type ***  3. Primary hypertension ***   Current Outpatient Medications on File Prior to Visit  Medication Sig Dispense Refill  . acetaminophen (TYLENOL) 500 MG tablet Take 1,000 mg by mouth every 6 (six) hours as needed (shoulder pain).    Marland Kitchen albuterol (PROVENTIL HFA;VENTOLIN HFA) 108 (90 Base) MCG/ACT inhaler Inhale 2 puffs into the lungs every 6 (six) hours as needed  for wheezing or shortness of breath.    . AMLODIPINE BESYLATE PO Take 5 mg by mouth daily at 6 (six) AM.    . aspirin EC 81 MG tablet Take by mouth.    . calcitRIOL (ROCALTROL) 0.25 MCG capsule Take 0.25 mcg by mouth daily.    . cetirizine (ZYRTEC) 10 MG chewable tablet Chew 10 mg by mouth daily as needed for allergies.    . Continuous Blood Gluc Sensor (FREESTYLE LIBRE 14 DAY SENSOR)  MISC by Does not apply route.    . fluticasone (FLONASE) 50 MCG/ACT nasal spray as needed.    . furosemide (LASIX) 40 MG tablet Take 80 mg q am and 40 mg qpm 60 tablet 2  . GLOBAL INJECT EASE INSULIN SYR 31G X 5/16" 0.5 ML MISC Inject into the skin.    Marland Kitchen glucose blood (ONETOUCH ULTRA) test strip TEST BLOOD SUGAR 4 TIMES DAILY    . hydrALAZINE (APRESOLINE) 50 MG tablet Take 50 mg by mouth 3 (three) times daily.    . insulin aspart (NOVOLOG) 100 UNIT/ML injection Inject into the skin 3 (three) times daily with meals. Inject 45 units before breakfast, 30 units ant lunch,45 units before supper    . insulin detemir (LEVEMIR) 100 UNIT/ML injection Inject 0.5 mLs (50 Units total) into the skin at bedtime. 10 mL 1  . isosorbide mononitrate (IMDUR) 30 MG 24 hr tablet Take 1 tablet (30 mg total) by mouth daily. 30 tablet 2  . Lancets (ONETOUCH DELICA PLUS LANCET30G) MISC USE 1 LANCET 4 TIMES DAILY    . lovastatin (MEVACOR) 40 MG tablet Take 40 mg by mouth daily with supper.    . metoprolol succinate (TOPROL-XL) 50 MG 24 hr tablet Take 50 mg by mouth daily.    . nitroGLYCERIN (NITROSTAT) 0.4 MG SL tablet Place 1 tablet (0.4 mg total) under the tongue every 5 (five) minutes as needed for chest pain. 20 tablet 1  . potassium citrate (UROCIT-K) 10 MEQ (1080 MG) SR tablet TAKE (1) TABLET BY MOUTH TWICE DAILY WITH A MEAL. 60 tablet 11  . tamsulosin (FLOMAX) 0.4 MG CAPS capsule TAKE (1) CAPSULE BY MOUTH EVERY DAY 30 capsule 1  . traMADol (ULTRAM) 50 MG tablet Take 1 tablet (50 mg total) by mouth every 12 (twelve) hours as needed. 30 tablet 1   No current facility-administered medications on file prior to visit.    There are no Patient Instructions on file for this visit. No follow-ups on file.   Georgiana Spinner, NP

## 2022-11-21 NOTE — Progress Notes (Signed)
Subjective:    Patient ID: James Clarke, male    DOB: 06/25/1941, 82 y.o.   MRN: [id number] Chief Complaint  Patient presents with   Follow-up    6 week HDA    James Clarke is an 82 year old male who recently underwent radiocephalic AV fistula placement of his left upper extremity on 09/19/2022.  He notes that he has been having some numbness and tingling in the back portion of his hand.  This is improved somewhat from previous but he still has some numbness and tingling in the first second and third fingers.  The swelling around the wound itself has resolved.  Today noninvasive studies show flow volume of 1329.  No areas of significant stenosis noted.    Review of Systems  Constitutional:  Positive for fatigue.  Cardiovascular:  Negative for leg swelling.  All other systems reviewed and are negative.      Objective:   Physical Exam Vitals reviewed.  HENT:     Head: Normocephalic.  Cardiovascular:     Rate and Rhythm: Normal rate.     Pulses:          Radial pulses are 1+ on the left side.     Arteriovenous access: Left arteriovenous access is present.    Comments: Good thrill and bruit but not easily detected Pulmonary:     Effort: Pulmonary effort is normal.  Skin:    General: Skin is warm and dry.  Neurological:     Mental Status: He is alert and oriented to person, place, and time.  Psychiatric:        Mood and Affect: Mood normal.        Behavior: Behavior normal.        Thought Content: Thought content normal.        Judgment: Judgment normal.     BP (!) 154/72 (BP Location: Right Arm)   Pulse 77   Resp 16   Wt (!) 317 lb (143.8 kg)   BMI 45.48 kg/m   Past Medical History:  Diagnosis Date   (HFpEF) heart failure with preserved ejection fraction (HCC)    a.) TTE 11/26/2021: EF 45%, mod LVH, post HK, mod LAE, mild MR/TR, G2DD; b.) TTE 02/26/2022: EF 55-60%, BAE, triv MR, G2DD; c.) TTE 07/09/2022: EF 45%, post HK, LVH, mod LAE, triv TR/PR, mild MR, G1DD    Anemia of chronic renal failure    Aortic atherosclerosis (HCC)    Asthma    Bell's palsy    BPH (benign prostatic hyperplasia)    CAD (coronary artery disease)    a.) LHC 12/31/2021: 60% mLAD, 20% pRCA, 20% dRCA, 20% o-pLCx --> med mgmt.   CKD (chronic kidney disease), stage IV (HCC)    Diabetic neuropathy (HCC)    Dyspnea on exertion    Ganglion cyst of wrist, right    a.) s/p excision 04/2011   Hepatic steatosis    History of 2019 novel coronavirus disease (COVID-19)    History of bilateral cataract extraction 2018   Hyperlipidemia    Hyperparathyroidism due to renal insufficiency (HCC)    Hypertension    Low testosterone in male    Nephrolithiasis    Obesity    OSA treated with BiPAP    Osteoarthritis    Peripheral edema    Right inguinal hernia    a.) s/p repair   Skin cancer    Type 2 diabetes mellitus with renal manifestations [facility])     Social History  Socioeconomic History   Marital status: Married    Spouse name: Not on file   Number of children: Not on file   Years of education: Not on file   Highest education level: Not on file  Occupational History   Not on file  Tobacco Use   Smoking status: Never    Passive exposure: Never   Smokeless tobacco: Never  Vaping Use   Vaping Use: Never used  Substance and Sexual Activity   Alcohol use: No   Drug use: Not Currently   Sexual activity: Yes    Birth control/protection: None  Other Topics Concern   Not on file  Social History Narrative   Not on file   Social Determinants of Health   Financial Resource Strain: Not on file  Food Insecurity: No Food Insecurity (06/12/2022)   Hunger Vital Sign    Worried About Running Out of Food in the Last Year: Never true    Ran Out of Food in the Last Year: Never true  Transportation Needs: No Transportation Needs (06/12/2022)   PRAPARE - Administrator, Civil Service (Medical): No    Lack of Transportation (Non-Medical): No  Physical Activity: Not on  file  Stress: Not on file  Social Connections: Not on file  Intimate Partner Violence: Not At Risk (06/12/2022)   Humiliation, Afraid, Rape, and Kick questionnaire    Fear of Current or Ex-Partner: No    Emotionally Abused: No    Physically Abused: No    Sexually Abused: No    Past Surgical History:  Procedure Laterality Date   APPENDECTOMY     AV FISTULA PLACEMENT Left 09/19/2022   Procedure: ARTERIOVENOUS (AV) FISTULA CREATION (RADIO CEPHALIC);  Surgeon: Annice Needy, MD;  Location: [facility];  Service: Vascular;  Laterality: Left;   CATARACT EXTRACTION W/PHACO Left 05/20/2017   Procedure: CATARACT EXTRACTION PHACO AND INTRAOCULAR LENS PLACEMENT (IOC);  Surgeon: Galen Manila, MD;  Location: [facility];  Service: Ophthalmology;  Laterality: Left;  [place] 00:32.0 AP% 15.5 CDE 4.97 Fluid Pack Lot # [id number]   CATARACT EXTRACTION W/PHACO Right 06/10/2017   Procedure: CATARACT EXTRACTION PHACO AND INTRAOCULAR LENS PLACEMENT (IOC);  Surgeon: Galen Manila, MD;  Location: [facility];  Service: Ophthalmology;  Laterality: Right;  [place]  00:51 AP% 15.4 CDE 7.95 Fluid pack lot # [id number]   CHOLECYSTECTOMY     COLONOSCOPY     x3   CYSTOSCOPY W/ RETROGRADES Right 08/13/2019   Procedure: CYSTOSCOPY WITH RETROGRADE PYELOGRAM;  Surgeon: Sondra Come, MD;  Location: [facility];  Service: Urology;  Laterality: Right;   CYSTOSCOPY WITH STENT PLACEMENT Right 07/26/2019   Procedure: CYSTOSCOPY WITH STENT PLACEMENT;  Surgeon: Crista Elliot, MD;  Location: [facility];  Service: Urology;  Laterality: Right;   CYSTOSCOPY/URETEROSCOPY/HOLMIUM LASER/STENT PLACEMENT Right 08/13/2019   Procedure: CYSTOSCOPY/URETEROSCOPY/HOLMIUM LASER/STENT EXCHANGE;  Surgeon: Sondra Come, MD;  Location: [facility];  Service: Urology;  Laterality: Right;   CYSTOSCOPY/URETEROSCOPY/HOLMIUM LASER/STENT PLACEMENT Left 02/27/2022   Procedure: CYSTOSCOPY/URETEROSCOPY/HOLMIUM LASER/STENT PLACEMENT;  Surgeon: Riki Altes,  MD;  Location: [facility];  Service: Urology;  Laterality: Left;   GANGLION CYST EXCISION Right    INGUINAL HERNIA REPAIR Right    LEFT HEART CATH AND CORONARY ANGIOGRAPHY N/A 12/31/2021   Procedure: LEFT HEART CATH AND CORONARY ANGIOGRAPHY;  Surgeon: Lamar Blinks, MD;  Location: [facility];  Service: Cardiovascular;  Laterality: N/A;   SHOULDER ARTHROSCOPY WITH SUBACROMIAL DECOMPRESSION AND OPEN ROTATOR C Right 09/11/2020  Procedure: Right shoulder arthroscopic rotator cuff repair vs Regeneten patch application, subacromial decompression, and biceps tenodesis - Dedra Skeens to Assist;  Surgeon: Signa Kell, MD;  Location: [facility];  Service: Orthopedics;  Laterality: Right;   TEMPORARY DIALYSIS CATHETER N/A 03/05/2022   Procedure: TEMPORARY DIALYSIS CATHETER;  Surgeon: Renford Dills, MD;  Location: [facility];  Service: Cardiovascular;  Laterality: N/A;   TONSILLECTOMY     URETEROSCOPY WITH HOLMIUM LASER LITHOTRIPSY Left 02/27/2022   Procedure: URETEROSCOPY WITH HOLMIUM LASER LITHOTRIPSY;  Surgeon: Riki Altes, MD;  Location: [facility];  Service: Urology;  Laterality: Left;   WRIST FRACTURE SURGERY Right     Family History  Problem Relation Age of Onset   Emphysema Mother    COPD Mother    Heart disease Mother    Brain cancer Father     Allergies  Allergen Reactions   Codeine Nausea And Vomiting   Doxycycline    Erythromycin Rash       Latest Ref Rng & Units 09/19/2022    6:36 AM 09/10/2022   10:22 AM 06/18/2022    5:54 AM  CBC  WBC 4.0 - 10.5 K/uL  10.6    Hemoglobin 13.0 - 17.0 g/dL 16.1  09.6  04.5   Hematocrit 39.0 - 52.0 % 32.0  37.0    Platelets 150 - 400 K/uL  313        CMP     Component Value Date/Time   NA 140 09/19/2022 0636   NA 141 04/10/2022 0000   NA 137 11/27/2013 0412   K 3.8 09/19/2022 0636   K 3.5 11/27/2013 0412   CL 102 09/19/2022 0636   CL 105 11/27/2013 0412   CO2 30 09/10/2022 1022   CO2 25 11/27/2013 0412    GLUCOSE 289 (H) 09/19/2022 0636   GLUCOSE 98 11/27/2013 0412   BUN 71 (H) 09/19/2022 0636   BUN 42 (H) 11/27/2013 0412   CREATININE 3.60 (H) 09/19/2022 0636   CREATININE 2.14 (H) 11/27/2013 0412   CALCIUM 8.8 (L) 09/10/2022 1022   CALCIUM 7.6 (L) 11/27/2013 0412   PROT 7.0 02/26/2022 0948   PROT 7.4 11/27/2013 0412   ALBUMIN 2.8 (L) 03/11/2022 0335   ALBUMIN 3.1 (L) 11/27/2013 0412   AST 21 02/26/2022 0948   AST 47 (H) 11/27/2013 0412   ALT 15 02/26/2022 0948   ALT 41 11/27/2013 0412   ALKPHOS 69 02/26/2022 0948   ALKPHOS 82 11/27/2013 0412   BILITOT 0.7 02/26/2022 0948   BILITOT 0.4 11/27/2013 0412   GFRNONAA 18 (L) 09/10/2022 1022   GFRNONAA 30 (L) 11/27/2013 0412   GFRAA 30 (L) 03/13/2020 1531   GFRAA 34 (L) 11/27/2013 0412     No results found.     Assessment & Plan:   1. Chronic kidney disease (CKD), stage IV (severe) (HCC) The patient does have some numbness in his thumb and first finger but it does not extend to the fingertips.  Based on his description I do not believe this is still syndrome but rather numbness due to nerve dissection from the surgery.  As far as the fistula itself has a good thrill and bruit and today the studies indicate that there is no significant stenosis and a good flow volume however it is not easily palpable with this activity.  I am fearful that when he begins dialysis they will be able to locate this.  Will try to give the patient several more weeks to see if  this matures naturally if not we may need to help with maturation via fistulogram.  2. Hyperlipidemia, unspecified hyperlipidemia type Continue statin as ordered and reviewed, no changes at this time  3. Primary hypertension Continue antihypertensive medications as already ordered, these medications have been reviewed and there are no changes at this time.   Current Outpatient Medications on File Prior to Visit  Medication Sig Dispense Refill   acetaminophen (TYLENOL) 500 MG  tablet Take 1,000 mg by mouth every 6 (six) hours as needed (shoulder pain).     albuterol (PROVENTIL HFA;VENTOLIN HFA) 108 (90 Base) MCG/ACT inhaler Inhale 2 puffs into the lungs every 6 (six) hours as needed for wheezing or shortness of breath.     AMLODIPINE BESYLATE PO Take 5 mg by mouth daily at 6 (six) AM.     aspirin EC 81 MG tablet Take by mouth.     calcitRIOL (ROCALTROL) 0.25 MCG capsule Take 0.25 mcg by mouth daily.     cetirizine (ZYRTEC) 10 MG chewable tablet Chew 10 mg by mouth daily as needed for allergies.     Continuous Blood Gluc Sensor (FREESTYLE LIBRE 14 DAY SENSOR) MISC by Does not apply route.     fluticasone (FLONASE) 50 MCG/ACT nasal spray as needed.     furosemide (LASIX) 40 MG tablet Take 80 mg q am and 40 mg qpm 60 tablet 2   GLOBAL INJECT EASE INSULIN SYR 31G X 5/16" 0.5 ML MISC Inject into the skin.     glucose blood (ONETOUCH ULTRA) test strip TEST BLOOD SUGAR 4 TIMES DAILY     hydrALAZINE (APRESOLINE) 50 MG tablet Take 50 mg by mouth 3 (three) times daily.     insulin aspart (NOVOLOG) 100 UNIT/ML injection Inject into the skin 3 (three) times daily with meals. Inject 45 units before breakfast, 30 units ant lunch,45 units before supper     insulin detemir (LEVEMIR) 100 UNIT/ML injection Inject 0.5 mLs (50 Units total) into the skin at bedtime. 10 mL 1   isosorbide mononitrate (IMDUR) 30 MG 24 hr tablet Take 1 tablet (30 mg total) by mouth daily. 30 tablet 2   Lancets (ONETOUCH DELICA PLUS LANCET30G) MISC USE 1 LANCET 4 TIMES DAILY     lovastatin (MEVACOR) 40 MG tablet Take 40 mg by mouth daily with supper.     metoprolol succinate (TOPROL-XL) 50 MG 24 hr tablet Take 50 mg by mouth daily.     nitroGLYCERIN (NITROSTAT) 0.4 MG SL tablet Place 1 tablet (0.4 mg total) under the tongue every 5 (five) minutes as needed for chest pain. 20 tablet 1   potassium citrate (UROCIT-K) 10 MEQ (1080 MG) SR tablet TAKE (1) TABLET BY MOUTH TWICE DAILY WITH A MEAL. 60 tablet 11    tamsulosin (FLOMAX) 0.4 MG CAPS capsule TAKE (1) CAPSULE BY MOUTH EVERY DAY 30 capsule 1   traMADol (ULTRAM) 50 MG tablet Take 1 tablet (50 mg total) by mouth every 12 (twelve) hours as needed. 30 tablet 1   No current facility-administered medications on file prior to visit.    There are no Patient Instructions on file for this visit. No follow-ups on file.   Georgiana Spinner, NP

## 2022-12-02 DIAGNOSIS — N184 Chronic kidney disease, stage 4 (severe): Secondary | ICD-10-CM | POA: Diagnosis not present

## 2022-12-02 DIAGNOSIS — E1122 Type 2 diabetes mellitus with diabetic chronic kidney disease: Secondary | ICD-10-CM | POA: Diagnosis not present

## 2022-12-03 DIAGNOSIS — G4733 Obstructive sleep apnea (adult) (pediatric): Secondary | ICD-10-CM | POA: Diagnosis not present

## 2022-12-04 DIAGNOSIS — Z794 Long term (current) use of insulin: Secondary | ICD-10-CM | POA: Diagnosis not present

## 2022-12-04 DIAGNOSIS — B351 Tinea unguium: Secondary | ICD-10-CM | POA: Diagnosis not present

## 2022-12-04 DIAGNOSIS — E1140 Type 2 diabetes mellitus with diabetic neuropathy, unspecified: Secondary | ICD-10-CM | POA: Diagnosis not present

## 2022-12-07 ENCOUNTER — Other Ambulatory Visit: Payer: Self-pay | Admitting: Urology

## 2022-12-09 DIAGNOSIS — R809 Proteinuria, unspecified: Secondary | ICD-10-CM | POA: Diagnosis not present

## 2022-12-09 DIAGNOSIS — E1122 Type 2 diabetes mellitus with diabetic chronic kidney disease: Secondary | ICD-10-CM | POA: Diagnosis not present

## 2022-12-09 DIAGNOSIS — N184 Chronic kidney disease, stage 4 (severe): Secondary | ICD-10-CM | POA: Diagnosis not present

## 2022-12-09 DIAGNOSIS — E1121 Type 2 diabetes mellitus with diabetic nephropathy: Secondary | ICD-10-CM | POA: Diagnosis not present

## 2022-12-09 DIAGNOSIS — N2581 Secondary hyperparathyroidism of renal origin: Secondary | ICD-10-CM | POA: Diagnosis not present

## 2022-12-09 DIAGNOSIS — R600 Localized edema: Secondary | ICD-10-CM | POA: Diagnosis not present

## 2022-12-09 DIAGNOSIS — I10 Essential (primary) hypertension: Secondary | ICD-10-CM | POA: Diagnosis not present

## 2022-12-09 DIAGNOSIS — D631 Anemia in chronic kidney disease: Secondary | ICD-10-CM | POA: Diagnosis not present

## 2022-12-10 ENCOUNTER — Encounter (INDEPENDENT_AMBULATORY_CARE_PROVIDER_SITE_OTHER): Payer: Self-pay | Admitting: *Deleted

## 2022-12-10 ENCOUNTER — Ambulatory Visit
Admission: RE | Admit: 2022-12-10 | Discharge: 2022-12-10 | Disposition: A | Discharge status: Home / Self Care | Payer: PPO | Source: Ambulatory Visit | Attending: Nephrology | Admitting: Nephrology

## 2022-12-10 DIAGNOSIS — I130 Hypertensive heart and chronic kidney disease with heart failure and stage 1 through stage 4 chronic kidney disease, or unspecified chronic kidney disease: Secondary | ICD-10-CM | POA: Diagnosis not present

## 2022-12-10 DIAGNOSIS — I509 Heart failure, unspecified: Secondary | ICD-10-CM | POA: Insufficient documentation

## 2022-12-10 DIAGNOSIS — I5033 Acute on chronic diastolic (congestive) heart failure: Secondary | ICD-10-CM | POA: Insufficient documentation

## 2022-12-10 DIAGNOSIS — R079 Chest pain, unspecified: Secondary | ICD-10-CM | POA: Insufficient documentation

## 2022-12-10 MED ORDER — FUROSEMIDE 10 MG/ML IJ SOLN
INTRAMUSCULAR | Status: AC
  Filled 2022-12-10: qty 8

## 2022-12-10 MED ORDER — FUROSEMIDE 10 MG/ML IJ SOLN
80.0000 mg | Freq: Once | INTRAMUSCULAR | Status: AC
  Administered 2022-12-10: 80 mg via INTRAVENOUS

## 2022-12-10 MED ORDER — FUROSEMIDE 10 MG/ML IJ SOLN: 80.0000 mg | INTRAMUSCULAR | Status: DC

## 2022-12-13 ENCOUNTER — Encounter: Payer: Self-pay | Admitting: Cardiology

## 2022-12-13 ENCOUNTER — Other Ambulatory Visit: Payer: Self-pay

## 2022-12-13 ENCOUNTER — Inpatient Hospital Stay
Admission: EM | Admit: 2022-12-13 | Discharge: 2022-12-17 | DRG: 291 | Disposition: A | Discharge status: Home Health | Payer: PPO | Source: Ambulatory Visit | Attending: Internal Medicine | Admitting: Internal Medicine

## 2022-12-13 ENCOUNTER — Ambulatory Visit
Admission: RE | Admit: 2022-12-13 | Discharge: 2022-12-13 | Disposition: A | Discharge status: Home / Self Care | Payer: PPO | Source: Ambulatory Visit | Attending: Nephrology | Admitting: Nephrology

## 2022-12-13 ENCOUNTER — Emergency Department: Payer: PPO

## 2022-12-13 ENCOUNTER — Ambulatory Visit
Admission: RE | Admit: 2022-12-13 | Discharge: 2022-12-13 | Disposition: A | Discharge status: Home / Self Care | Payer: PPO | Source: Ambulatory Visit | Attending: Internal Medicine | Admitting: Internal Medicine

## 2022-12-13 VITALS — BP 170/80 | HR 72 | Resp 26

## 2022-12-13 DIAGNOSIS — Z1152 Encounter for screening for COVID-19: Secondary | ICD-10-CM

## 2022-12-13 DIAGNOSIS — R071 Chest pain on breathing: Secondary | ICD-10-CM | POA: Diagnosis not present

## 2022-12-13 DIAGNOSIS — R0789 Other chest pain: Secondary | ICD-10-CM

## 2022-12-13 DIAGNOSIS — I700 Atherosclerosis of aorta: Secondary | ICD-10-CM | POA: Diagnosis present

## 2022-12-13 DIAGNOSIS — R0600 Dyspnea, unspecified: Principal | ICD-10-CM

## 2022-12-13 DIAGNOSIS — I10 Essential (primary) hypertension: Secondary | ICD-10-CM | POA: Diagnosis present

## 2022-12-13 DIAGNOSIS — J45909 Unspecified asthma, uncomplicated: Secondary | ICD-10-CM | POA: Diagnosis not present

## 2022-12-13 DIAGNOSIS — E1122 Type 2 diabetes mellitus with diabetic chronic kidney disease: Secondary | ICD-10-CM | POA: Diagnosis not present

## 2022-12-13 DIAGNOSIS — N184 Chronic kidney disease, stage 4 (severe): Secondary | ICD-10-CM | POA: Diagnosis not present

## 2022-12-13 DIAGNOSIS — N2581 Secondary hyperparathyroidism of renal origin: Secondary | ICD-10-CM | POA: Diagnosis not present

## 2022-12-13 DIAGNOSIS — Z881 Allergy status to other antibiotic agents status: Secondary | ICD-10-CM

## 2022-12-13 DIAGNOSIS — E876 Hypokalemia: Secondary | ICD-10-CM | POA: Diagnosis not present

## 2022-12-13 DIAGNOSIS — I209 Angina pectoris, unspecified: Secondary | ICD-10-CM | POA: Diagnosis not present

## 2022-12-13 DIAGNOSIS — Z794 Long term (current) use of insulin: Secondary | ICD-10-CM | POA: Diagnosis not present

## 2022-12-13 DIAGNOSIS — I509 Heart failure, unspecified: Secondary | ICD-10-CM | POA: Insufficient documentation

## 2022-12-13 DIAGNOSIS — E1129 Type 2 diabetes mellitus with other diabetic kidney complication: Secondary | ICD-10-CM | POA: Diagnosis present

## 2022-12-13 DIAGNOSIS — Z6841 Body Mass Index (BMI) 40.0 and over, adult: Secondary | ICD-10-CM

## 2022-12-13 DIAGNOSIS — I2489 Other forms of acute ischemic heart disease: Secondary | ICD-10-CM | POA: Diagnosis present

## 2022-12-13 DIAGNOSIS — R0602 Shortness of breath: Secondary | ICD-10-CM | POA: Diagnosis not present

## 2022-12-13 DIAGNOSIS — I5031 Acute diastolic (congestive) heart failure: Secondary | ICD-10-CM | POA: Diagnosis not present

## 2022-12-13 DIAGNOSIS — Z8616 Personal history of COVID-19: Secondary | ICD-10-CM

## 2022-12-13 DIAGNOSIS — Z66 Do not resuscitate: Secondary | ICD-10-CM | POA: Diagnosis present

## 2022-12-13 DIAGNOSIS — I2510 Atherosclerotic heart disease of native coronary artery without angina pectoris: Secondary | ICD-10-CM | POA: Diagnosis present

## 2022-12-13 DIAGNOSIS — D631 Anemia in chronic kidney disease: Secondary | ICD-10-CM | POA: Diagnosis present

## 2022-12-13 DIAGNOSIS — R809 Proteinuria, unspecified: Secondary | ICD-10-CM | POA: Diagnosis not present

## 2022-12-13 DIAGNOSIS — Z8249 Family history of ischemic heart disease and other diseases of the circulatory system: Secondary | ICD-10-CM

## 2022-12-13 DIAGNOSIS — K760 Fatty (change of) liver, not elsewhere classified: Secondary | ICD-10-CM | POA: Diagnosis present

## 2022-12-13 DIAGNOSIS — I5033 Acute on chronic diastolic (congestive) heart failure: Secondary | ICD-10-CM

## 2022-12-13 DIAGNOSIS — Z79899 Other long term (current) drug therapy: Secondary | ICD-10-CM

## 2022-12-13 DIAGNOSIS — I5A Non-ischemic myocardial injury (non-traumatic): Secondary | ICD-10-CM | POA: Diagnosis not present

## 2022-12-13 DIAGNOSIS — G4733 Obstructive sleep apnea (adult) (pediatric): Secondary | ICD-10-CM

## 2022-12-13 DIAGNOSIS — I2089 Other forms of angina pectoris: Secondary | ICD-10-CM | POA: Diagnosis not present

## 2022-12-13 DIAGNOSIS — N400 Enlarged prostate without lower urinary tract symptoms: Secondary | ICD-10-CM | POA: Diagnosis not present

## 2022-12-13 DIAGNOSIS — Z7189 Other specified counseling: Secondary | ICD-10-CM | POA: Diagnosis not present

## 2022-12-13 DIAGNOSIS — E785 Hyperlipidemia, unspecified: Secondary | ICD-10-CM | POA: Diagnosis present

## 2022-12-13 DIAGNOSIS — R079 Chest pain, unspecified: Secondary | ICD-10-CM | POA: Insufficient documentation

## 2022-12-13 DIAGNOSIS — G4730 Sleep apnea, unspecified: Secondary | ICD-10-CM | POA: Diagnosis not present

## 2022-12-13 DIAGNOSIS — E66813 Obesity, class 3: Secondary | ICD-10-CM | POA: Diagnosis present

## 2022-12-13 DIAGNOSIS — Z885 Allergy status to narcotic agent status: Secondary | ICD-10-CM

## 2022-12-13 DIAGNOSIS — Z85828 Personal history of other malignant neoplasm of skin: Secondary | ICD-10-CM

## 2022-12-13 DIAGNOSIS — E1140 Type 2 diabetes mellitus with diabetic neuropathy, unspecified: Secondary | ICD-10-CM | POA: Diagnosis present

## 2022-12-13 DIAGNOSIS — Z808 Family history of malignant neoplasm of other organs or systems: Secondary | ICD-10-CM

## 2022-12-13 DIAGNOSIS — I493 Ventricular premature depolarization: Secondary | ICD-10-CM | POA: Insufficient documentation

## 2022-12-13 DIAGNOSIS — Z87442 Personal history of urinary calculi: Secondary | ICD-10-CM

## 2022-12-13 DIAGNOSIS — Z825 Family history of asthma and other chronic lower respiratory diseases: Secondary | ICD-10-CM

## 2022-12-13 DIAGNOSIS — I130 Hypertensive heart and chronic kidney disease with heart failure and stage 1 through stage 4 chronic kidney disease, or unspecified chronic kidney disease: Principal | ICD-10-CM | POA: Diagnosis present

## 2022-12-13 LAB — RENAL FUNCTION PANEL
Albumin: 2.8 g/dL — ABNORMAL LOW (ref 3.5–5.0)
Anion gap: 10 (ref 5–15)
BUN: 74 mg/dL — ABNORMAL HIGH (ref 8–23)
CO2: 26 mmol/L (ref 22–32)
Calcium: 9.2 mg/dL (ref 8.9–10.3)
Chloride: 103 mmol/L (ref 98–111)
Creatinine, Ser: 3.67 mg/dL — ABNORMAL HIGH (ref 0.61–1.24)
GFR, Estimated: 16 mL/min — ABNORMAL LOW (ref >60)
Glucose, Bld: 193 mg/dL — ABNORMAL HIGH (ref 70–99)
Phosphorus: 4.4 mg/dL (ref 2.5–4.6)
Potassium: 3.7 mmol/L (ref 3.5–5.1)
Sodium: 139 mmol/L (ref 135–145)

## 2022-12-13 LAB — TROPONIN I (HIGH SENSITIVITY)
Troponin I (High Sensitivity): 74 ng/L — ABNORMAL HIGH (ref <18)
Troponin I (High Sensitivity): 66 ng/L — ABNORMAL HIGH (ref <18)
Troponin I (High Sensitivity): 78 ng/L — ABNORMAL HIGH (ref <18)

## 2022-12-13 LAB — GLUCOSE, CAPILLARY
Glucose-Capillary: 291 mg/dL — ABNORMAL HIGH (ref 70–99)
Glucose-Capillary: 273 mg/dL — ABNORMAL HIGH (ref 70–99)

## 2022-12-13 LAB — CBC
HCT: 32.4 % — ABNORMAL LOW (ref 39.0–52.0)
Hemoglobin: 9.9 g/dL — ABNORMAL LOW (ref 13.0–17.0)
MCH: 28.1 pg (ref 26.0–34.0)
MCHC: 30.6 g/dL (ref 30.0–36.0)
MCV: 92 fL (ref 80.0–100.0)
Platelets: 337 10*3/uL (ref 150–400)
RBC: 3.52 MIL/uL — ABNORMAL LOW (ref 4.22–5.81)
RDW: 14 % (ref 11.5–15.5)
WBC: 14.3 10*3/uL — ABNORMAL HIGH (ref 4.0–10.5)
nRBC: 0 % (ref 0.0–0.2)

## 2022-12-13 LAB — BRAIN NATRIURETIC PEPTIDE: B Natriuretic Peptide: 361.8 pg/mL — ABNORMAL HIGH (ref 0.0–100.0)

## 2022-12-13 MED ORDER — FUROSEMIDE 10 MG/ML IJ SOLN
INTRAMUSCULAR | Status: AC
  Filled 2022-12-13: qty 8

## 2022-12-13 MED ORDER — INSULIN ASPART 100 UNIT/ML IJ SOLN
0.0000 [IU] | Freq: Three times a day (TID) | INTRAMUSCULAR | Status: DC
  Administered 2022-12-13 – 2022-12-14 (×2): 11 [IU] via SUBCUTANEOUS
  Administered 2022-12-14 – 2022-12-15 (×3): 15 [IU] via SUBCUTANEOUS
  Administered 2022-12-15: 7 [IU] via SUBCUTANEOUS
  Filled 2022-12-13 (×6): qty 1

## 2022-12-13 MED ORDER — ASPIRIN 81 MG PO TBEC
81.0000 mg | DELAYED_RELEASE_TABLET | Freq: Every day | ORAL | Status: DC
  Administered 2022-12-14 – 2022-12-17 (×4): 81 mg via ORAL
  Filled 2022-12-13 (×4): qty 1

## 2022-12-13 MED ORDER — CALCITRIOL 0.25 MCG PO CAPS
0.2500 ug | ORAL_CAPSULE | Freq: Every day | ORAL | Status: DC
  Administered 2022-12-14 – 2022-12-17 (×4): 0.25 ug via ORAL
  Filled 2022-12-13 (×4): qty 1

## 2022-12-13 MED ORDER — SODIUM CHLORIDE 0.9 % IV SOLN: 250.0000 mL | INTRAVENOUS | Status: DC | PRN

## 2022-12-13 MED ORDER — ASPIRIN 81 MG PO CHEW
324.0000 mg | CHEWABLE_TABLET | Freq: Once | ORAL | Status: AC
  Administered 2022-12-13: 324 mg via ORAL

## 2022-12-13 MED ORDER — METOPROLOL SUCCINATE ER 50 MG PO TB24
50.0000 mg | ORAL_TABLET | Freq: Every day | ORAL | Status: DC
  Administered 2022-12-14 – 2022-12-17 (×4): 50 mg via ORAL
  Filled 2022-12-13 (×4): qty 1

## 2022-12-13 MED ORDER — ISOSORBIDE MONONITRATE ER 30 MG PO TB24
30.0000 mg | ORAL_TABLET | Freq: Every day | ORAL | Status: DC
  Administered 2022-12-14 – 2022-12-17 (×4): 30 mg via ORAL
  Filled 2022-12-13 (×4): qty 1

## 2022-12-13 MED ORDER — ALBUTEROL SULFATE (2.5 MG/3ML) 0.083% IN NEBU
2.5000 mg | INHALATION_SOLUTION | Freq: Four times a day (QID) | RESPIRATORY_TRACT | Status: DC | PRN
  Administered 2022-12-15: 2.5 mg via RESPIRATORY_TRACT
  Filled 2022-12-13: qty 3

## 2022-12-13 MED ORDER — NITROGLYCERIN 0.4 MG SL SUBL
0.4000 mg | SUBLINGUAL_TABLET | SUBLINGUAL | Status: DC
  Filled 2022-12-13: qty 1

## 2022-12-13 MED ORDER — NITROGLYCERIN 0.4 MG SL SUBL
0.4000 mg | SUBLINGUAL_TABLET | SUBLINGUAL | Status: DC | PRN
  Administered 2022-12-13: 0.4 mg via SUBLINGUAL
  Filled 2022-12-13: qty 1

## 2022-12-13 MED ORDER — ASPIRIN 81 MG PO CHEW
CHEWABLE_TABLET | ORAL | Status: AC
  Filled 2022-12-13: qty 4

## 2022-12-13 MED ORDER — AMLODIPINE BESYLATE 5 MG PO TABS
5.0000 mg | ORAL_TABLET | Freq: Every day | ORAL | Status: DC
  Administered 2022-12-14 – 2022-12-17 (×4): 5 mg via ORAL
  Filled 2022-12-13 (×4): qty 1

## 2022-12-13 MED ORDER — INSULIN GLARGINE-YFGN 100 UNIT/ML ~~LOC~~ SOLN
25.0000 [IU] | Freq: Every day | SUBCUTANEOUS | Status: DC
  Administered 2022-12-13 – 2022-12-14 (×2): 25 [IU] via SUBCUTANEOUS
  Filled 2022-12-13 (×3): qty 0.25

## 2022-12-13 MED ORDER — PRAVASTATIN SODIUM 40 MG PO TABS
40.0000 mg | ORAL_TABLET | Freq: Every day | ORAL | Status: DC
  Administered 2022-12-14 – 2022-12-16 (×3): 40 mg via ORAL
  Filled 2022-12-13 (×3): qty 1

## 2022-12-13 MED ORDER — NITROGLYCERIN 0.4 MG SL SUBL
SUBLINGUAL_TABLET | SUBLINGUAL | Status: AC
  Filled 2022-12-13: qty 1

## 2022-12-13 MED ORDER — FUROSEMIDE 10 MG/ML IJ SOLN
6.0000 mg/h | INTRAVENOUS | Status: DC
  Administered 2022-12-13 – 2022-12-16 (×3): 6 mg/h via INTRAVENOUS
  Filled 2022-12-13 (×3): qty 20

## 2022-12-13 MED ORDER — HEPARIN SODIUM (PORCINE) 5000 UNIT/ML IJ SOLN
5000.0000 [IU] | Freq: Three times a day (TID) | INTRAMUSCULAR | Status: DC
  Administered 2022-12-13 – 2022-12-17 (×12): 5000 [IU] via SUBCUTANEOUS
  Filled 2022-12-13 (×13): qty 1

## 2022-12-13 MED ORDER — NITROGLYCERIN 2 % TD OINT
1.0000 [in_us] | TOPICAL_OINTMENT | TRANSDERMAL | Status: AC
  Administered 2022-12-13: 1 [in_us] via TOPICAL
  Filled 2022-12-13: qty 1

## 2022-12-13 MED ORDER — SODIUM CHLORIDE 0.9% FLUSH
3.0000 mL | INTRAVENOUS | Status: DC | PRN
  Administered 2022-12-17: 3 mL via INTRAVENOUS

## 2022-12-13 MED ORDER — FUROSEMIDE 10 MG/ML IJ SOLN
80.0000 mg | Freq: Once | INTRAMUSCULAR | Status: AC
  Administered 2022-12-13: 80 mg via INTRAVENOUS

## 2022-12-13 MED ORDER — SODIUM CHLORIDE 0.9% FLUSH
3.0000 mL | Freq: Two times a day (BID) | INTRAVENOUS | Status: DC
  Administered 2022-12-13 – 2022-12-15 (×4): 3 mL via INTRAVENOUS

## 2022-12-13 MED ORDER — FUROSEMIDE 10 MG/ML IJ SOLN: 4.0000 mg/h | INTRAMUSCULAR | Status: DC

## 2022-12-13 MED ORDER — TAMSULOSIN HCL 0.4 MG PO CAPS
0.4000 mg | ORAL_CAPSULE | Freq: Every day | ORAL | Status: DC
  Administered 2022-12-14 – 2022-12-17 (×4): 0.4 mg via ORAL
  Filled 2022-12-13 (×4): qty 1

## 2022-12-13 MED ORDER — ACETAMINOPHEN 325 MG PO TABS: 650.0000 mg | ORAL_TABLET | ORAL | Status: DC | PRN

## 2022-12-13 MED ORDER — NITROGLYCERIN 0.4 MG SL SUBL
0.4000 mg | SUBLINGUAL_TABLET | SUBLINGUAL | Status: DC | PRN
  Administered 2022-12-13: 0.4 mg via SUBLINGUAL

## 2022-12-13 MED ORDER — ONDANSETRON HCL 4 MG/2ML IJ SOLN: 4.0000 mg | Freq: Four times a day (QID) | INTRAMUSCULAR | Status: DC | PRN

## 2022-12-13 NOTE — Assessment & Plan Note (Signed)
-   BiPAP at bedtime 

## 2022-12-13 NOTE — Progress Notes (Signed)
Patient here for Lasix injections at order of Dr. Mady Haagensen. This is his second injection, states his weight is down 3 pounds from Wednesday when he had injection but he did not have a lot of urine output. Breathing is very labored, has mid sternal chest pressure rated at 8/10, and is teary eyed. He doesn't want to go to the ED, has not and does not really want to take his NTG. Sat is 95% on RA he is in a wheelchair because he states any movement and he can't breathe. He states he doesn't really feel worse than the last time he saw Dr. Cherylann Ratel or Katheren Shams.  Patient and wife state that Dr. Garnett Farm office is closed today.  Discussed events and current state with Clarisa [facility] from [facility].  She has ordered an EKG, would like to encourage patient to take his NTG, SL and also encourage's patient to go to the ED for evaluation.

## 2022-12-13 NOTE — Assessment & Plan Note (Signed)
Patient reports chest pain when feeling dyspneic.  No chest pain at rest.  Troponin is elevated, however appears to be within range patient's baseline.  No new EKG changes.  Low suspicion at this time for ACS.  - Cardiology following; appreciate their recommendations - Repeat troponin pending - Stat EKG for any new onset chest pain

## 2022-12-13 NOTE — ED Triage Notes (Signed)
Pt to ED from same day surgery for chest pain and shob. Sent from same day surgery where pt had IV lasix. Pt had EKG and initial troponin and labs with same day. Pt with difficulty speaking in complete sentences.  Pt received 324 asa, nitro x1 PTA.

## 2022-12-13 NOTE — H&P (Signed)
History and Physical    Patient: James Clarke UJW:[id number] DOB: 04/20/1941 DOA: 12/13/2022 DOS: the patient was seen and examined on 12/13/2022 PCP: Marina Goodell, MD  Patient coming from: Home  Chief Complaint:  Chief Complaint  Patient presents with   Chest Pain   Shortness of Breath   HPI: James Clarke is a 82 y.o. male with medical history significant of HFpEF, CAD, CKD stage IV, type 2 diabetes, hypertension, hyperlipidemia, anemia of chronic kidney disease, BPH, who presents to the ED due to increased shortness of breath and chest pain  Mr. Chery states that after his hospitalization in November 2023, he had been doing well with his diuretics and volume status.  However 2 weeks ago, he had a rapid worsening in his dyspnea on exertion and shortness of breath at rest.  He has not had any increased urine output since increasing his diuretic regimen as instructed by his nephrologist.  In addition, his weight has stayed stable around 307 pounds.  He endorses chest pain, that particularly occurs when he feels very dyspneic but does not occur without dyspnea.  He denies any palpitations.  He denies any lower extremity swelling that is worse than usual.  Per chart review, patient has had relatively stable CKD stage IV, however worsening hypervolemia that has been difficult to manage.  Patient was seen on 12/09/2022 by nephrology at which time he was started on IV Lasix 80 mg twice a week with oral Lasix on the rest of the days.  He had an injection on 8/21.  He presented today for his second injection.  At the time, RN noted patient's breathing was labored and patient was reporting midsternal chest pain.  He was given one-time dose of sublingual nitro and felt slightly better.  Cardiology was consulted and evaluated patient in shorts today.  Patient was recommended to go to the ED.  ED course: On arrival to the ED, patient was hypertensive at 149/61 with heart rate of 71.  He was  saturating at 96% on room air.  He was afebrile at 97.7.  Workup obtained at short stay surgery center demonstrates WBC of 14.3, hemoglobin 9.9, creatinine 3.67, and GFR of 16.  Troponin elevated at 74 and.  BNP elevated at 361. Chest x-ray was obtained that demonstrated bilateral diffuse opacities.  Bilateral lower extremity Dopplers were obtained and pending.  Cardiology consulted.  TRH contacted for admission.  Review of Systems: As mentioned in the history of present illness. All other systems reviewed and are negative.  Past Medical History:  Diagnosis Date   (HFpEF) heart failure with preserved ejection fraction (HCC)    a.) TTE 11/26/2021: EF 45%, mod LVH, post HK, mod LAE, mild MR/TR, G2DD; b.) TTE 02/26/2022: EF 55-60%, BAE, triv MR, G2DD; c.) TTE 07/09/2022: EF 45%, post HK, LVH, mod LAE, triv TR/PR, mild MR, G1DD   Anemia of chronic renal failure    Aortic atherosclerosis (HCC)    Asthma    Bell's palsy    BPH (benign prostatic hyperplasia)    CAD (coronary artery disease)    a.) LHC 12/31/2021: 60% mLAD, 20% pRCA, 20% dRCA, 20% o-pLCx --> med mgmt.   CKD (chronic kidney disease), stage IV (HCC)    Diabetic neuropathy (HCC)    Dyspnea on exertion    Ganglion cyst of wrist, right    a.) s/p excision 04/2011   Hepatic steatosis    History of 2019 novel coronavirus disease (COVID-19)    History of  bilateral cataract extraction 2018   Hyperlipidemia    Hyperparathyroidism due to renal insufficiency (HCC)    Hypertension    Low testosterone in male    Nephrolithiasis    Obesity    OSA treated with BiPAP    Osteoarthritis    Peripheral edema    Right inguinal hernia    a.) s/p repair   Skin cancer    Type 2 diabetes mellitus with renal manifestations [facility])    Past Surgical History:  Procedure Laterality Date   APPENDECTOMY     AV FISTULA PLACEMENT Left 09/19/2022   Procedure: ARTERIOVENOUS (AV) FISTULA CREATION (RADIO CEPHALIC);  Surgeon: Annice Needy, MD;  Location: [facility]  ORS;  Service: Vascular;  Laterality: Left;   CATARACT EXTRACTION W/PHACO Left 05/20/2017   Procedure: CATARACT EXTRACTION PHACO AND INTRAOCULAR LENS PLACEMENT (IOC);  Surgeon: Galen Manila, MD;  Location: [facility] ORS;  Service: Ophthalmology;  Laterality: Left;  [place] 00:32.0 AP% 15.5 CDE 4.97 Fluid Pack Lot # [id number]   CATARACT EXTRACTION W/PHACO Right 06/10/2017   Procedure: CATARACT EXTRACTION PHACO AND INTRAOCULAR LENS PLACEMENT (IOC);  Surgeon: Galen Manila, MD;  Location: [facility] ORS;  Service: Ophthalmology;  Laterality: Right;  [place]  00:51 AP% 15.4 CDE 7.95 Fluid pack lot # [id number]   CHOLECYSTECTOMY     COLONOSCOPY     x3   CYSTOSCOPY W/ RETROGRADES Right 08/13/2019   Procedure: CYSTOSCOPY WITH RETROGRADE PYELOGRAM;  Surgeon: Sondra Come, MD;  Location: [facility] ORS;  Service: Urology;  Laterality: Right;   CYSTOSCOPY WITH STENT PLACEMENT Right 07/26/2019   Procedure: CYSTOSCOPY WITH STENT PLACEMENT;  Surgeon: Crista Elliot, MD;  Location: [facility] ORS;  Service: Urology;  Laterality: Right;   CYSTOSCOPY/URETEROSCOPY/HOLMIUM LASER/STENT PLACEMENT Right 08/13/2019   Procedure: CYSTOSCOPY/URETEROSCOPY/HOLMIUM LASER/STENT EXCHANGE;  Surgeon: Sondra Come, MD;  Location: [facility] ORS;  Service: Urology;  Laterality: Right;   CYSTOSCOPY/URETEROSCOPY/HOLMIUM LASER/STENT PLACEMENT Left 02/27/2022   Procedure: CYSTOSCOPY/URETEROSCOPY/HOLMIUM LASER/STENT PLACEMENT;  Surgeon: Riki Altes, MD;  Location: [facility] ORS;  Service: Urology;  Laterality: Left;   GANGLION CYST EXCISION Right    INGUINAL HERNIA REPAIR Right    LEFT HEART CATH AND CORONARY ANGIOGRAPHY N/A 12/31/2021   Procedure: LEFT HEART CATH AND CORONARY ANGIOGRAPHY;  Surgeon: Lamar Blinks, MD;  Location: [facility] INVASIVE CV LAB;  Service: Cardiovascular;  Laterality: N/A;   SHOULDER ARTHROSCOPY WITH SUBACROMIAL DECOMPRESSION AND OPEN ROTATOR C Right 09/11/2020   Procedure: Right shoulder arthroscopic rotator cuff repair vs  Regeneten patch application, subacromial decompression, and biceps tenodesis - Dedra Skeens to Assist;  Surgeon: Signa Kell, MD;  Location: [facility] ORS;  Service: Orthopedics;  Laterality: Right;   TEMPORARY DIALYSIS CATHETER N/A 03/05/2022   Procedure: TEMPORARY DIALYSIS CATHETER;  Surgeon: Renford Dills, MD;  Location: [facility] INVASIVE CV LAB;  Service: Cardiovascular;  Laterality: N/A;   TONSILLECTOMY     URETEROSCOPY WITH HOLMIUM LASER LITHOTRIPSY Left 02/27/2022   Procedure: URETEROSCOPY WITH HOLMIUM LASER LITHOTRIPSY;  Surgeon: Riki Altes, MD;  Location: [facility] ORS;  Service: Urology;  Laterality: Left;   WRIST FRACTURE SURGERY Right    Social History:  reports that he has never smoked. He has never been exposed to tobacco smoke. He has never used smokeless tobacco. He reports that he does not currently use drugs. He reports that he does not drink alcohol.  Allergies  Allergen Reactions   Codeine Nausea And Vomiting   Doxycycline    Erythromycin Rash    Family History  Problem Relation Age of  Onset   Emphysema Mother    COPD Mother    Heart disease Mother    Brain cancer Father     Prior to Admission medications   Medication Sig Start Date End Date Taking? Authorizing Provider  amLODipine (NORVASC) 5 MG tablet Take 5 mg by mouth daily. 12/07/22  Yes [provider]  acetaminophen (TYLENOL) 500 MG tablet Take 1,000 mg by mouth every 6 (six) hours as needed (shoulder pain).    [provider]  albuterol (PROVENTIL HFA;VENTOLIN HFA) 108 (90 Base) MCG/ACT inhaler Inhale 2 puffs into the lungs every 6 (six) hours as needed for wheezing or shortness of breath.    [provider]  AMLODIPINE BESYLATE PO Take 5 mg by mouth daily at 6 (six) AM.    [provider]  aspirin EC 81 MG tablet Take by mouth.    [provider]  calcitRIOL (ROCALTROL) 0.25 MCG capsule Take 0.25 mcg by mouth daily. 07/13/19   [provider]  cetirizine  (ZYRTEC) 10 MG chewable tablet Chew 10 mg by mouth daily as needed for allergies.    [provider]  Continuous Blood Gluc Sensor (FREESTYLE LIBRE 14 DAY SENSOR) MISC by Does not apply route.    [provider]  fluticasone (FLONASE) 50 MCG/ACT nasal spray as needed. 03/17/17   [provider]  furosemide (LASIX) 40 MG tablet Take 80 mg q am and 40 mg qpm 06/21/22   Enedina Finner, MD  GLOBAL INJECT EASE INSULIN SYR 31G X 5/16" 0.5 ML MISC Inject into the skin. 08/16/21   [provider]  glucose blood (ONETOUCH ULTRA) test strip TEST BLOOD SUGAR 4 TIMES DAILY 10/26/18   [provider]  hydrALAZINE (APRESOLINE) 50 MG tablet Take 50 mg by mouth 3 (three) times daily.    [provider]  insulin aspart (NOVOLOG) 100 UNIT/ML injection Inject into the skin 3 (three) times daily with meals. Inject 45 units before breakfast, 30 units ant lunch,45 units before supper 08/15/21   [provider]  insulin detemir (LEVEMIR) 100 UNIT/ML injection Inject 0.5 mLs (50 Units total) into the skin at bedtime. 06/20/22   Enedina Finner, MD  isosorbide mononitrate (IMDUR) 30 MG 24 hr tablet Take 1 tablet (30 mg total) by mouth daily. 06/21/22   Enedina Finner, MD  Lancets [facility] DELICA PLUS LANCET30G) MISC USE 1 LANCET 4 TIMES DAILY 05/04/19   [provider]  lovastatin (MEVACOR) 40 MG tablet Take 40 mg by mouth daily with supper. 04/28/17   [provider]  metoprolol succinate (TOPROL-XL) 50 MG 24 hr tablet Take 50 mg by mouth daily. 09/01/21   [provider]  nitroGLYCERIN (NITROSTAT) 0.4 MG SL tablet Place 1 tablet (0.4 mg total) under the tongue every 5 (five) minutes as needed for chest pain. 06/20/22   Enedina Finner, MD  potassium citrate (UROCIT-K) 10 MEQ (1080 MG) SR tablet TAKE (1) TABLET BY MOUTH TWICE DAILY WITH A MEAL. 10/21/22   Stoioff, Verna Czech, MD  tamsulosin (FLOMAX) 0.4 MG CAPS capsule TAKE (1) CAPSULE BY MOUTH EVERY DAY 12/09/22    Stoioff, Verna Czech, MD  traMADol (ULTRAM) 50 MG tablet Take 1 tablet (50 mg total) by mouth every 12 (twelve) hours as needed. 09/19/22   Annice Needy, MD    Physical Exam: Vitals:   12/13/22 1319 12/13/22 1320 12/13/22 1430  BP: (!) 149/61  (!) 165/74  Pulse: 71  70  Resp: (!) 24    Temp: 97.7  F (36.5 C)    SpO2: 96%  100%  Weight:  (!) 139.3 kg   Height:  5\' 10"  (1.778 m)    Physical Exam Vitals and nursing note reviewed.  Constitutional:      Appearance: He is obese. He is ill-appearing and diaphoretic.  HENT:     Head: Normocephalic and atraumatic.  Eyes:     Extraocular Movements: Extraocular movements intact.     Pupils: Pupils are equal, round, and reactive to light.  Cardiovascular:     Rate and Rhythm: Normal rate. Rhythm irregular.     Heart sounds: No murmur heard.    Comments: Irregular rhythm due to frequent PVCs as seen on telemetry during examination.  Otherwise regular. Pulmonary:     Effort: Tachypnea and respiratory distress (Minimal) present.     Breath sounds: Rales present.  Abdominal:     General: Bowel sounds are normal. There is distension.     Palpations: Abdomen is soft.     Tenderness: There is no abdominal tenderness.  Musculoskeletal:     Right lower leg: 1+ Pitting Edema present.     Left lower leg: 1+ Pitting Edema present.  Skin:    General: Skin is warm.  Neurological:     General: No focal deficit present.     Mental Status: He is alert and oriented to person, place, and time.  Psychiatric:        Mood and Affect: Mood is anxious.        Behavior: Behavior normal.    Data Reviewed: CBC with WBC of 14.3, hemoglobin 9.9, MCV 92 and platelets of 337 BMP with sodium of 139, potassium 3.7, bicarb 26, glucose 193, BUN 74, creatinine 3.67, albumin 2.8 and GFR 16.   BNP elevated at 361 Troponin elevated at 74  EKG per my review.  Sinus rhythm with rate of 72.  Borderline first-degree AV block.  No concerning ST or T wave changes.  US  Venous Img Lower Bilateral  Result Date: 12/13/2022 CLINICAL DATA:  Dyspnea EXAM: BILATERAL LOWER EXTREMITY VENOUS DOPPLER ULTRASOUND TECHNIQUE: Gray-scale sonography with graded compression, as well as color Doppler and duplex ultrasound were performed to evaluate the lower extremity deep venous systems from the level of the common femoral vein and including the common femoral, femoral, profunda femoral, popliteal and calf veins including the posterior tibial, peroneal and gastrocnemius veins when visible. The superficial great saphenous vein was also interrogated. Spectral Doppler was utilized to evaluate flow at rest and with distal augmentation maneuvers in the common femoral, femoral and popliteal veins. COMPARISON:  None Available. FINDINGS: RIGHT LOWER EXTREMITY Common Femoral Vein: No evidence of thrombus. Normal compressibility, respiratory phasicity and response to augmentation. Saphenofemoral Junction: No evidence of thrombus. Normal compressibility and flow on color Doppler imaging. Profunda Femoral Vein: No evidence of thrombus. Normal compressibility and flow on color Doppler imaging. Femoral Vein: No evidence of thrombus. Normal compressibility, respiratory phasicity and response to augmentation. Popliteal Vein: No evidence of thrombus. Normal compressibility, respiratory phasicity and response to augmentation. Calf Veins: No evidence of thrombus. Normal compressibility and flow on color Doppler imaging. Superficial Great Saphenous Vein: No evidence of thrombus. Normal compressibility. Venous Reflux:  None. Other Findings:  None. LEFT LOWER EXTREMITY Common Femoral Vein: No evidence of thrombus. Normal compressibility, respiratory phasicity and response to augmentation. Saphenofemoral Junction: No evidence of thrombus. Normal compressibility and flow on color Doppler imaging. Profunda Femoral Vein: No evidence of thrombus. Normal compressibility and flow on color Doppler imaging.  Femoral Vein: No  evidence of thrombus. Normal compressibility, respiratory phasicity and response to augmentation. Popliteal Vein: No evidence of thrombus. Normal compressibility, respiratory phasicity and response to augmentation. Calf Veins: No evidence of thrombus. Normal compressibility and flow on color Doppler imaging. Superficial Great Saphenous Vein: No evidence of thrombus. Normal compressibility. Venous Reflux:  None. Other Findings:  None. IMPRESSION: No evidence of deep venous thrombosis in either lower extremity. Electronically Signed   By: Malachy Moan M.D.   On: 12/13/2022 14:57   X-ray chest PA or AP  Result Date: 12/13/2022 CLINICAL DATA:  Digestive heart failure. EXAM: CHEST  1 VIEW COMPARISON:  June 14, 2022. FINDINGS: Stable cardiomediastinal silhouette. Bilateral diffuse reticular opacities are noted, but acute superimposed edema or atypical inflammation can not be excluded. Bony thorax is unremarkable. IMPRESSION: Bilateral diffuse reticular opacities are again noted as described above. Electronically Signed   By: Lupita Raider M.D.   On: 12/13/2022 11:26    Results are pending, will review when available.  Assessment and Plan:  * Acute on chronic heart failure (HCC) Patient is presenting with 2 weeks of dyspnea on exertion and shortness of breath that is worse than previous consistent with acute on chronic heart failure exacerbation.  Despite receiving IV Lasix in the outpatient setting, patient has not lost any significant weight or had increased urine output or symptom relief.  I suspect this is largely due to patient's nephrotic range proteinuria and CKD stage IV.  - Cardiology and nephrology consulted; appreciate their recommendations - S/p one-time dose of IV Lasix 80 mg earlier this a.m. - Start Lasix infusion at 6 mg/h - Daily weights - Strict in and out - Given rapid change in symptoms, will obtain echocardiogram to evaluate for new onset reduced EF.  Chronic kidney  disease (CKD), stage IV (severe) (HCC) History of CKD with nephrotic range proteinuria with most recent UPCR of 9 g.  Follows with Dr. Cherylann Ratel.  May ultimately require dialysis as he has done so in the past during exacerbations.  - Nephrology consulted; appreciate their recommendations - Daily BMP - Strict in and out  Chest pain Patient reports chest pain when feeling dyspneic.  No chest pain at rest.  Troponin is elevated, however appears to be within range patient's baseline.  No new EKG changes.  Low suspicion at this time for ACS.  - Cardiology following; appreciate their recommendations - Repeat troponin pending - Stat EKG for any new onset chest pain  Type 2 diabetes mellitus with renal manifestations [facility]) Patient reports the he has brittle diabetes.  Currently on Levemir 50 units at bedtime, NovoLog 3 times daily with meals (45 units, 30 units, 45 units).  - SSI, resistant - Levemir 25 units at bedtime  Hypertension - Continue home metoprolol, amlodipine, and hydralazine - Restart home Imdur tomorrow given administration of Nitropaste today  Sleep apnea - BiPAP at bedtime  Goals of care, counseling/discussion Mr. Pennick, Mrs. Saiki and I had a frank discussion today regarding goals of care.  Patient states that under no circumstances would he want to be on a ventilator at any point, even if short-term.  He reported that he would want CPR though.  We discussed that CPR without intubation is generally not successful, and patients usually need intubation even if for short period.  He stated that he would not want intubation even in that scenario.  Due to this, Mr. and Mrs. Porada decided to transition to DNR/DNI.  - CODE STATUS DNR/DNI  Advance Care Planning:   Code Status: DNR/DNI.  Please see goals of care discussion as noted above.  Consults: Cardiology, nephrology  Family Communication: Patient's wife updated at bedside  Severity of Illness: The appropriate patient status  for this patient is INPATIENT. Inpatient status is judged to be reasonable and necessary in order to provide the required intensity of service to ensure the patient's safety. The patient's presenting symptoms, physical exam findings, and initial radiographic and laboratory data in the context of their chronic comorbidities is felt to place them at high risk for further clinical deterioration. Furthermore, it is not anticipated that the patient will be medically stable for discharge from the hospital within 2 midnights of admission.   * I certify that at the point of admission it is my clinical judgment that the patient will require inpatient hospital care spanning beyond 2 midnights from the point of admission due to high intensity of service, high risk for further deterioration and high frequency of surveillance required.*  Author: Verdene Lennert, MD 12/13/2022 3:17 PM  For on call review [email or web address].

## 2022-12-13 NOTE — Assessment & Plan Note (Signed)
Watch creatinine with diuresis.  Had fistula placed left wrist previously.

## 2022-12-13 NOTE — Assessment & Plan Note (Signed)
Patient is presenting with 2 weeks of dyspnea on exertion and shortness of breath that is worse than previous consistent with acute on chronic heart failure exacerbation.  Despite receiving IV Lasix in the outpatient setting, patient has not lost any significant weight or had increased urine output or symptom relief.  I suspect this is largely due to patient's nephrotic range proteinuria and CKD stage IV.  - Cardiology and nephrology consulted; appreciate their recommendations - S/p one-time dose of IV Lasix 80 mg earlier this a.m. - Start Lasix infusion at 6 mg/h - Daily weights - Strict in and out - Given rapid change in symptoms, will obtain echocardiogram to evaluate for new onset reduced EF.

## 2022-12-13 NOTE — Assessment & Plan Note (Signed)
Patient reports the he has brittle diabetes.  Currently on Levemir 50 units at bedtime, NovoLog 3 times daily with meals (45 units, 30 units, 45 units).  - SSI, resistant - Levemir 25 units at bedtime

## 2022-12-13 NOTE — Assessment & Plan Note (Signed)
On metoprolol and Norvasc.

## 2022-12-13 NOTE — Assessment & Plan Note (Deleted)
James Clarke, James Clarke and I had a frank discussion today regarding goals of care.  Patient states that under no circumstances would he want to be on a ventilator at any point, even if short-term.  He reported that he would want CPR though.  We discussed that CPR without intubation is generally not successful, and patients usually need intubation even if for short period.  He stated that he would not want intubation even in that scenario.  Due to this, [name] decided to transition to DNR/DNI.  - CODE STATUS DNR/DNI

## 2022-12-13 NOTE — Progress Notes (Cosign Needed Addendum)
Adriana Mccallum Fontaine is an 82 year old male with a past medical history of CKD 4-5, chronic HFmrEF (EF 45% 06/2022), nonobstructive CAD by [facility] 12/2021, DM2 who presented to [facility] same-day surgery for IV Lasix ordered by his nephrologist, Dr. Cherylann Ratel. Dr. Juliann Pares was contacted by nursing staff at [facility] same day surgery as the patient was experienced chest pressure and shortness of breath.   HPI: I evaluated the patient at the bedside with his wife present.  He states he has been experiencing worsening dyspnea on exertion for the past 2 weeks causing significant limitations to his activity.  Walking between rooms in his home causes him significant shortness of breath, then he experiences chest pressure without radiation, nausea, or vomiting that resolves when he sits down to rest after a couple minutes.  He has not needed to take nitroglycerin at home for this chest pressure.  He saw his nephrologist on 5/20 and the patient was experiencing similar symptoms, so Dr. Cherylann Ratel ordered IV Lasix at same-day surgery twice weekly for 4 doses I believe.  He received the first dose on Tuesday, 5/22 and the patient did not feel any differently or notice any improvement after this.  The patient also has an AV fistula present on his left wrist placed in February that has not reached maturity to start using for dialysis. When he presented to same-day surgery today for his second infusion, he was experiencing significant shortness of breath, then chest pressure he describes as "something sitting on his chest" without radiation, nausea, vomiting. Chest pressure relieved with SL nitro x1.  He states he feels worse than he did prior to needing admission in November of last year for a heart failure exacerbation.  He has chronic orthopnea and sleeps at an incline with 1 pillow.  Peripheral edema appears to be at baseline. Denies increased salt or fluid intake.  Urine output at baseline.  EKG obtained and reviewed by myself and Dr. Juliann Pares  demonstrates sinus rhythm with rate of 71 bpm with a PVC, without acute ischemic changes, overall without significant changes from prior study in November 2023.    Hypertensive to 170/80 and Spo2 95% RA. HR 80s. BNP elevated to 360, K+ 3.7, BUN/Cr  74/3.67 and GFR 16. CXR with blunting of L costophrenic angle with at least small L pleural effusion.   Exam: General: Ill-appearing elderly Caucasian male, sitting up in a wheelchair with his wife and nurse present.  Lung: Conversational dyspnea on room air, decreased breath sounds bilaterally with trace left-sided crackles.  No appreciable wheezes. Heart: Regular rate and rhythm with S1-S2 present without appreciable murmurs. Abdomen: Soft, slightly distended with excess adiposity. Extremities: Warm to touch.  Both legs with compression stockings up to the knees with trace nonpitting edema bilaterally  MDM: The patient's worsening dyspnea with minimal exertion , chest pressure resolving with SL nitro, and minimal improvement in dyspnea with IV lasix dose on Tuesday I was first concerned about ACS. Fortunately initial EKG is without acute ST changes compared to prior study from 05/2022. BMP elevation and chronic but worsening CKD 4 + pleural effusions on CXR I am c/f acute decompensated CHF that I do not think can safely be managed in the outpatient setting with IV lasix alone.   Plan: -ok to give IV lasix 80mg  x 1 -Give 325 mg aspirin now -trend troponins until peak  -repeat EKG with recurrent chest pressure -recommend patient present directly to the emergency department for further evaluation and management and possible hospital admission  This patient's plan of care was discussed and created with Dr. Juliann Pares and he is in agreement.    Rebeca Allegra, PA-C

## 2022-12-13 NOTE — Progress Notes (Signed)
External urinary catheter placed.  Patient was up to void x1 and was very short of breath following.  After giving lasix IV as ordered the patient requested that we place the external catheter as he feels that it will be too taxing to get up and down.

## 2022-12-13 NOTE — ED Provider Notes (Signed)
[facility] Provider Note    Event Date/Time   First MD Initiated Contact with Patient 12/13/22 1331     (approximate)   History   Chest Pain and Shortness of Breath   HPI  James Clarke is a 82 y.o. male history of renal insufficiency, congestive heart failure presents today for symptoms of chest tightness.  He was seen and evaluated note reviewed from cardiology who saw the patient today.  He was treated with IV Lasix and referred to the ER due to ongoing symptoms of intermittent chest pressure and increasing dyspnea despite outpatient treatment  At the present time he reports a very light sensation of chest pressure.  It is somewhat improved but he also reports he has not made any additional urine after getting Lasix which was also the case when he received Lasix few days ago as well  No heavy chest pressure no radiating pain in the neck.  Reports symptoms slowly and gradually worsening over 2 weeks.  Swelling.  No fevers or chills.  Slight cough nonproductive      Physical Exam   Triage Vital Signs: ED Triage Vitals  Enc Vitals Group     BP 12/13/22 1319 (!) 149/61     Pulse Rate 12/13/22 1319 71     Resp 12/13/22 1319 (!) 24     Temp 12/13/22 1319 97.7 F (36.5 C)     Temp src --      SpO2 12/13/22 1319 96 %     Weight 12/13/22 1320 (!) 307 lb (139.3 kg)     Height 12/13/22 1320 5\' 10"  (1.778 m)     Head Circumference --      Peak Flow --      Pain Score 12/13/22 1320 0     Pain Loc --      Pain Edu? --      Excl. in GC? --     Most recent vital signs: Vitals:   12/13/22 1319 12/13/22 1430  BP: (!) 149/61 (!) 165/74  Pulse: 71 70  Resp: (!) 24   Temp: 97.7 F (36.5 C)   SpO2: 96% 100%     General: Awake, no distress.  Normal oxygen saturation room air.  Does not appear in acute distress but does appear just slightly tachypneic CV:  Good peripheral perfusion.  Normal tones and rate Resp:  Normal effort.  Patient has mild  rales noted in the lower lung fields bilaterally.  No cough.  No obvious dyspnea.  Slight increase in respiratory rate but otherwise normal effort Abd:  No distention.  Other:  No lower extremity edema venous cords or congestion.   ED Results / Procedures / Treatments   Labs (all labs ordered are listed, but only abnormal results are displayed) Labs Reviewed  CBC - Abnormal; Notable for the following components:      Result Value   WBC 14.3 (*)    RBC 3.52 (*)    Hemoglobin 9.9 (*)    HCT 32.4 (*)    All other components within normal limits  TROPONIN I (HIGH SENSITIVITY) - Abnormal; Notable for the following components:   Troponin I (High Sensitivity) 66 (*)    All other components within normal limits     EKG  Interpreted by me at 1330 heart rate 70 QRS 110 QTc 440 Normal sinus rhythm.  No evidence of acute ischemia probable old anteroseptal infarct   RADIOLOGY  Chest x-ray interpreted by me as diffuse  opacities   X-ray chest PA or AP  Result Date: 12/13/2022 CLINICAL DATA:  Digestive heart failure. EXAM: CHEST  1 VIEW COMPARISON:  June 14, 2022. FINDINGS: Stable cardiomediastinal silhouette. Bilateral diffuse reticular opacities are noted, but acute superimposed edema or atypical inflammation can not be excluded. Bony thorax is unremarkable. IMPRESSION: Bilateral diffuse reticular opacities are again noted as described above. Electronically Signed   By: Lupita Raider M.D.   On: 12/13/2022 11:26      PROCEDURES:  Critical Care performed: No  Procedures   MEDICATIONS ORDERED IN ED: Medications  nitroGLYCERIN (NITROGLYN) 2 % ointment 1 inch (1 inch Topical Given 12/13/22 1425)     IMPRESSION / MDM / ASSESSMENT AND PLAN / ED COURSE  I reviewed the triage vital signs and the nursing notes.                              Differential diagnosis includes, but is not limited to, CHF exacerbation, demand ischemia angina, CHF ACS, less likely causation such as DVT  PE no pleuritic pain, will obtain ultrasound to evaluate exclude venous thrombosis of the lower extremities.  However chronic renal disease precludes CT angio for further assessment but I think this is low likelihood especially in the setting of not having a lower extremity DVT and his symptoms seem rather subacute with the progressive increasing shortness of breath.  He denies any obvious infectious symptoms or fever but does have leukocytosis and some diffuse opacities.  However his labs seem to and his clinical history also seem be indicative of likely volume overload.  He reports relief after receiving nitroglycerin tablet, nitroglycerin paste placed.  Have consulted our hospitalist and patient accepted to the service of Dr. Huel Cote for further work-up.  Cardiology and nephrology both already involved in patient's care.  He was seen by cardiology today and evidently given aspirin and Lasix prior to ED arrival as well  Patient's presentation is most consistent with acute complicated illness / injury requiring diagnostic workup.   The patient is on the cardiac monitor to evaluate for evidence of arrhythmia and/or significant heart rate changes.       FINAL CLINICAL IMPRESSION(S) / ED DIAGNOSES   Final diagnoses:  Dyspnea, unspecified type  Chest pain, unspecified type     Rx / DC Orders   ED Discharge Orders     None        Note:  This document was prepared using Dragon voice recognition software and may include unintentional dictation errors.   Sharyn Creamer, MD 12/13/22 [phone]

## 2022-12-14 DIAGNOSIS — I10 Essential (primary) hypertension: Secondary | ICD-10-CM | POA: Diagnosis not present

## 2022-12-14 DIAGNOSIS — I5A Non-ischemic myocardial injury (non-traumatic): Secondary | ICD-10-CM

## 2022-12-14 DIAGNOSIS — E1122 Type 2 diabetes mellitus with diabetic chronic kidney disease: Secondary | ICD-10-CM | POA: Diagnosis not present

## 2022-12-14 DIAGNOSIS — N184 Chronic kidney disease, stage 4 (severe): Secondary | ICD-10-CM | POA: Diagnosis not present

## 2022-12-14 DIAGNOSIS — I5033 Acute on chronic diastolic (congestive) heart failure: Secondary | ICD-10-CM | POA: Diagnosis not present

## 2022-12-14 LAB — BASIC METABOLIC PANEL
Anion gap: 10 (ref 5–15)
BUN: 71 mg/dL — ABNORMAL HIGH (ref 8–23)
CO2: 27 mmol/L (ref 22–32)
Calcium: 8.6 mg/dL — ABNORMAL LOW (ref 8.9–10.3)
Chloride: 105 mmol/L (ref 98–111)
Creatinine, Ser: 3.56 mg/dL — ABNORMAL HIGH (ref 0.61–1.24)
GFR, Estimated: 16 mL/min — ABNORMAL LOW (ref >60)
Glucose, Bld: 212 mg/dL — ABNORMAL HIGH (ref 70–99)
Potassium: 3.5 mmol/L (ref 3.5–5.1)
Sodium: 142 mmol/L (ref 135–145)

## 2022-12-14 LAB — MAGNESIUM: Magnesium: 2.3 mg/dL (ref 1.7–2.4)

## 2022-12-14 NOTE — Progress Notes (Signed)
Progress Note   Patient: James Clarke ZOX:[id number] DOB: 22-Aug-1940 DOA: 12/13/2022     1 DOS: the patient was seen and examined on 12/14/2022   Brief hospital course: 82 y.o. male with medical history significant of HFpEF, CAD, CKD stage IV, type 2 diabetes, hypertension, hyperlipidemia, anemia of chronic kidney disease, BPH, who presents to the ED due to increased shortness of breath and chest pain   James Clarke states that after his hospitalization in November 2023, he had been doing well with his diuretics and volume status.  However 2 weeks ago, he had a rapid worsening in his dyspnea on exertion and shortness of breath at rest.  He has not had any increased urine output since increasing his diuretic regimen as instructed by his nephrologist.  In addition, his weight has stayed stable around 307 pounds.  He endorses chest pain, that particularly occurs when he feels very dyspneic but does not occur without dyspnea.  He denies any palpitations.  He denies any lower extremity swelling that is worse than usual.   Per chart review, patient has had relatively stable CKD stage IV, however worsening hypervolemia that has been difficult to manage.  Patient was seen on 12/09/2022 by nephrology at which time he was started on IV Lasix 80 mg twice a week with oral Lasix on the rest of the days.  He had an injection on 8/21.  He presented today for his second injection.  At the time, RN noted patient's breathing was labored and patient was reporting midsternal chest pain.  He was given one-time dose of sublingual nitro and felt slightly better.  Cardiology was consulted and evaluated patient in shorts today.  Patient was recommended to go to the ED.  5/25.  Patient on Lasix drip.  Still not feeling better with regards to his shortness of breath.  Assessment and Plan: * Acute on chronic diastolic CHF (congestive heart failure) (HCC) Continue IV Lasix drip.  Patient also on Toprol.  Chronic kidney disease  (CKD), stage IV (severe) (HCC) Watch creatinine with diuresis.  Had fistula placed left wrist previously.  Type 2 diabetes mellitus with renal manifestations (HCC) - SSI, resistant - Semglee 25 units at bedtime  Hypertension On metoprolol and Norvasc.  Sleep apnea BiPAP at bedtime  Obesity, Class III, BMI 40-49.9 (morbid obesity) (HCC) With current height and weight in computer BMI 44.05  Myocardial injury Suspect demand ischemia from heart failure and chronic kidney disease.        Subjective: Patient not feeling better from when he came in.  Still short of breath.  Still limited on what he can do.  Physical Exam: Vitals:   12/13/22 2115 12/13/22 2307 12/14/22 0445 12/14/22 0857  BP:  (!) 141/64 (!) 141/63 (!) 160/66  Pulse:  70 67 78  Resp:  15 16 18   Temp:  98 F (36.7 C) 97.7 F (36.5 C) 97.9 F (36.6 C)  SpO2: 98% 93% 94% 94%  Weight:      Height:       Physical Exam HENT:     Head: Normocephalic.     Mouth/Throat:     Pharynx: No oropharyngeal exudate.  Eyes:     General: Lids are normal.     Conjunctiva/sclera: Conjunctivae normal.  Cardiovascular:     Rate and Rhythm: Normal rate and regular rhythm.     Heart sounds: Normal heart sounds, S1 normal and S2 normal.  Pulmonary:     Breath sounds: Examination of the right-lower  field reveals decreased breath sounds and rales. Examination of the left-lower field reveals decreased breath sounds and rales. Decreased breath sounds and rales present. No wheezing or rhonchi.  Abdominal:     General: There is distension.     Palpations: Abdomen is soft.     Tenderness: There is no abdominal tenderness.  Musculoskeletal:     Right lower leg: Swelling present.     Left lower leg: Swelling present.  Skin:    General: Skin is warm.     Findings: No rash.  Neurological:     Mental Status: He is alert and oriented to person, place, and time.     Data Reviewed:  BUN 71 creatinine 3.56, hemoglobin 9.9, last  ferritin 149 Family Communication: Spoke with wife at the bedside  Disposition: Status is: Inpatient Remains inpatient appropriate because: New IV Lasix drip since patient not feeling better  Planned Discharge Destination: Home with home health    Time spent: 28 minutes  Author: Alford Highland, MD 12/14/2022 1:28 PM  For on call review [email or web address].

## 2022-12-14 NOTE — Evaluation (Signed)
Physical Therapy Evaluation Patient Details Name: James Clarke MRN: [id number] DOB: Jun 21, 1941 Today's Date: 12/14/2022  History of Present Illness  James Clarke is a 82 y.o. male with medical history significant of HFpEF, CAD, CKD stage IV, type 2 diabetes, hypertension, hyperlipidemia, anemia of chronic kidney disease, BPH, who presents to the ED due to increased shortness of breath and chest pain.   Clinical Impression  Pt admitted with above diagnosis. Pt currently with functional limitations due to the deficits listed below (see PT Problem List). Pt received upright in bed with spouse at bedside agreeable to PT. Reports at baseline he is indep with household gait and mod-I using SPC in community. Spouse assists PRN with ADL's due to chronic R shoulder impairments.   To date pt exiting L side of bed at mod-I with bed features, standing and ambulating in room at supervision level. Pt with mild DOE with gait leading to slowed gait cadence due to baseline per subjective reports but good stability with gait in room ~20. Pt in recliner with all needs in reach. Educated pt and spouse on PLB and energy conservation techniques in household and during acute admission. Mild desat on RA to 87% but improve s> 90% with seated rest and PLB. Pt will benefit from f/u PT recs at discharge to address acute endurance deficits due to CHF exacerbation to return to PLOF.          Recommendations for follow up therapy are one component of a multi-disciplinary discharge planning process, led by the attending physician.  Recommendations may be updated based on patient status, additional functional criteria and insurance authorization.     Assistance Recommended at Discharge Intermittent Supervision/Assistance  Patient can return home with the following  A little help with walking and/or transfers;A little help with bathing/dressing/bathroom;Assistance with cooking/housework;Assist for transportation;Help with  stairs or ramp for entrance    Equipment Recommendations None recommended by PT  Recommendations for Other Services       Functional Status Assessment Patient has had a recent decline in their functional status and demonstrates the ability to make significant improvements in function in a reasonable and predictable amount of time.     Precautions / Restrictions Precautions Precautions: Fall Restrictions Weight Bearing Restrictions: No      Mobility  Bed Mobility Overal bed mobility: Modified Independent             General bed mobility comments: with bed features Patient Response: Cooperative  Transfers Overall transfer level: Needs assistance Equipment used: None Transfers: Sit to/from Stand Sit to Stand: Supervision           General transfer comment: bouts of momentum    Ambulation/Gait Ambulation/Gait assistance: Supervision Gait Distance (Feet): 20 Feet Assistive device: None Gait Pattern/deviations: Step-through pattern, Decreased step length - right, Decreased step length - left       General Gait Details: generally slowed gait due to mild  DOE but completes without UE support and step through pattern  Stairs            Wheelchair Mobility    Modified Rankin (Stroke Patients Only)       Balance Overall balance assessment: Mild deficits observed, not formally tested                                           Pertinent Vitals/Pain Pain Assessment Pain Assessment: Faces  Faces Pain Scale: Hurts a little bit Pain Location: lower back, chest tightness Pain Descriptors / Indicators: Aching, Discomfort, Tightness Pain Intervention(s): Limited activity within patient's tolerance, Repositioned    Home Living Family/patient expects to be discharged to:: Private residence Living Arrangements: Spouse/significant other Available Help at Discharge: Family;Available 24 hours/day Type of Home: House Home Access: Stairs to  enter Entrance Stairs-Rails: Left (from garage) Entrance Stairs-Number of Steps: 3-4   Home Layout: One level Home Equipment: Cane - single point;Grab bars - tub/shower;Shower seat - built in;Other (comment) (lift chair and adjustable bed)      Prior Function Prior Level of Function : Independent/Modified Independent             Mobility Comments: Indep in home no AD, uses SPC in community and outside ADLs Comments: Wife assists with some dressing and bathing due to chronic R shoulder impairment     Hand Dominance   Dominant Hand: Right    Extremity/Trunk Assessment   Upper Extremity Assessment Upper Extremity Assessment: Overall WFL for tasks assessed    Lower Extremity Assessment Lower Extremity Assessment: Overall WFL for tasks assessed    Cervical / Trunk Assessment Cervical / Trunk Assessment: Normal  Communication   Communication: No difficulties  Cognition Arousal/Alertness: Awake/alert Behavior During Therapy: WFL for tasks assessed/performed Overall Cognitive Status: Within Functional Limits for tasks assessed                                          General Comments General comments (skin integrity, edema, etc.): SPO2 desat to 87-88% post gait in room. Quick return > 90% with seated rest and PLB    Exercises Other Exercises Other Exercises: Role of PT in acute setting, d/c recs, energy conservation techniques.   Assessment/Plan    PT Assessment Patient needs continued PT services  PT Problem List Decreased strength;Decreased activity tolerance       PT Treatment Interventions DME instruction;Balance training;Gait training;Neuromuscular re-education;Stair training;Patient/family education;Functional mobility training;Therapeutic activities;Therapeutic exercise    PT Goals (Current goals can be found in the Care Plan section)  Acute Rehab PT Goals Patient Stated Goal: Improve breathing, return home PT Goal Formulation: With  patient/family Time For Goal Achievement: 12/28/22 Potential to Achieve Goals: Good    Frequency Min 3X/week     Co-evaluation               AM-PAC PT "6 Clicks" Mobility  Outcome Measure Help needed turning from your back to your side while in a flat bed without using bedrails?: A Little Help needed moving from lying on your back to sitting on the side of a flat bed without using bedrails?: A Little Help needed moving to and from a bed to a chair (including a wheelchair)?: A Little Help needed standing up from a chair using your arms (e.g., wheelchair or bedside chair)?: A Little Help needed to walk in hospital room?: A Little Help needed climbing 3-5 steps with a railing? : A Little 6 Click Score: 18    End of Session Equipment Utilized During Treatment: Gait belt Activity Tolerance: Patient tolerated treatment well Patient left: in chair;with call bell/phone within reach;with chair alarm set Nurse Communication: Mobility status PT Visit Diagnosis: Other abnormalities of gait and mobility (R26.89);Muscle weakness (generalized) (M62.81)    Time: 1610-9604 PT Time Calculation (min) (ACUTE ONLY): 15 min   Charges:   PT Evaluation $PT  Eval Low Complexity: 1 Low         Lil Lepage M. Fairly IV, PT, DPT Physical Therapist- [facility]  [facility]  12/14/2022, 11:00 AM

## 2022-12-14 NOTE — Progress Notes (Signed)
[facility]  ROUNDING NOTE   Subjective:  Patient well-known to [place] as we follow him for diabetes mellitus type 2 with chronic kidney disease, chronic kidney disease stage IV. He has been troubled with volume overload recently. We set him up for outpatient Lasix injection 80 mg IV times twice a week for the next 2 weeks.  When he presented for his second injection he unfortunately became significantly short of breath. He also had chest pain. He was referred over to the emergency department. Patient started on Lasix drip and is currently on 6 mg/h. Still having some shortness of breath but slightly improved as compared to admission.   Objective:  Vital signs in last 24 hours:  Temp:  [97.6 F (36.4 C)-98 F (36.7 C)] 97.9 F (36.6 C) (05/25 0857) Pulse Rate:  [67-78] 78 (05/25 0857) Resp:  [15-24] 18 (05/25 0857) BP: (141-165)/(61-89) 160/66 (05/25 0857) SpO2:  [93 %-100 %] 94 % (05/25 0857) FiO2 (%):  [21 %] 21 % (05/24 2115) Weight:  [139.3 kg] 139.3 kg (05/24 1320)  Weight change:  Filed Weights   12/13/22 1320  Weight: (!) 139.3 kg    Intake/Output: I/O last 3 completed shifts: In: 120 [P.O.:120] Out: 2075 [Urine:2075]   Intake/Output this shift:  No intake/output data recorded.  Physical Exam: General: No acute distress  Head: Normocephalic, atraumatic. Moist oral mucosal membranes  Neck: Supple  Lungs:  Basilar rales, normal effort  Heart: S1S2 no rubs  Abdomen:  Soft, nontender, bowel sounds present  Extremities: Trace peripheral edema.  Neurologic: Awake, alert, following commands  Skin: No acute rash  Access: Left upper extremity AV fistula    Basic Metabolic Panel: Recent Labs  Lab 12/13/22 1025 12/14/22 0427  NA 139 142  K 3.7 3.5  CL 103 105  CO2 26 27  GLUCOSE 193* 212*  BUN 74* 71*  CREATININE 3.67* 3.56*  CALCIUM 9.2 8.6*  MG  --  2.3  PHOS 4.4  --     Liver Function Tests: Recent Labs  Lab 12/13/22 1025  ALBUMIN  2.8*   No results for input(s): "LIPASE", "AMYLASE" in the last 168 hours. No results for input(s): "AMMONIA" in the last 168 hours.  CBC: Recent Labs  Lab 12/13/22 1400  WBC 14.3*  HGB 9.9*  HCT 32.4*  MCV 92.0  PLT 337    Cardiac Enzymes: No results for input(s): "CKTOTAL", "CKMB", "CKMBINDEX", "TROPONINI" in the last 168 hours.  BNP: Invalid input(s): "POCBNP"  CBG: Recent Labs  Lab 12/13/22 1802 12/13/22 2106  GLUCAP 291* 273*    Microbiology: Results for orders placed or performed during the hospital encounter of 06/12/22  Resp Panel by RT-PCR (Flu A&B, Covid) Anterior Nasal Swab     Status: None   Collection Time: 06/12/22  2:20 PM   Specimen: Anterior Nasal Swab  Result Value Ref Range Status   SARS Coronavirus 2 by RT PCR NEGATIVE NEGATIVE Final    Comment: (NOTE) SARS-CoV-2 target nucleic acids are NOT DETECTED.  The SARS-CoV-2 RNA is generally detectable in upper respiratory specimens during the acute phase of infection. The lowest concentration of SARS-CoV-2 viral copies this assay can detect is 138 copies/mL. A negative result does not preclude SARS-Cov-2 infection and should not be used as the sole basis for treatment or other patient management decisions. A negative result may occur with  improper specimen collection/handling, submission of specimen other than nasopharyngeal swab, presence of viral mutation(s) within the areas targeted by this assay, and  inadequate number of viral copies(<138 copies/mL). A negative result must be combined with clinical observations, patient history, and epidemiological information. The expected result is Negative.  Fact Sheet for Patients:  BloggerCourse.com  Fact Sheet for Healthcare Providers:  SeriousBroker.it  This test is no t yet approved or cleared by the [place] FDA and  has been authorized for detection and/or diagnosis of SARS-CoV-2 by FDA under  an Emergency Use Authorization (EUA). This EUA will remain  in effect (meaning this test can be used) for the duration of the COVID-19 declaration under Section 564(b)(1) of the Act, 21 U.S.C.section 360bbb-3(b)(1), unless the authorization is terminated  or revoked sooner.       Influenza A by PCR NEGATIVE NEGATIVE Final   Influenza B by PCR NEGATIVE NEGATIVE Final    Comment: (NOTE) The Xpert Xpress SARS-CoV-2/FLU/RSV plus assay is intended as an aid in the diagnosis of influenza from Nasopharyngeal swab specimens and should not be used as a sole basis for treatment. Nasal washings and aspirates are unacceptable for Xpert Xpress SARS-CoV-2/FLU/RSV testing.  Fact Sheet for Patients: BloggerCourse.com  Fact Sheet for Healthcare Providers: SeriousBroker.it  This test is not yet approved or cleared by the [place] FDA and has been authorized for detection and/or diagnosis of SARS-CoV-2 by FDA under an Emergency Use Authorization (EUA). This EUA will remain in effect (meaning this test can be used) for the duration of the COVID-19 declaration under Section 564(b)(1) of the Act, 21 U.S.C. section 360bbb-3(b)(1), unless the authorization is terminated or revoked.  Performed at [facility], [address] [place], [place] [address]     Coagulation Studies: No results for input(s): "LABPROT", "INR" in the last 72 hours.  Urinalysis: No results for input(s): "COLORURINE", "LABSPEC", "PHURINE", "GLUCOSEU", "HGBUR", "BILIRUBINUR", "KETONESUR", "PROTEINUR", "UROBILINOGEN", "NITRITE", "LEUKOCYTESUR" in the last 72 hours.  Invalid input(s): "APPERANCEUR"    Imaging: US Venous Img Lower Bilateral  Result Date: 12/13/2022 CLINICAL DATA:  Dyspnea EXAM: BILATERAL LOWER EXTREMITY VENOUS DOPPLER ULTRASOUND TECHNIQUE: Gray-scale sonography with graded compression, as well as color Doppler and duplex ultrasound were  performed to evaluate the lower extremity deep venous systems from the level of the common femoral vein and including the common femoral, femoral, profunda femoral, popliteal and calf veins including the posterior tibial, peroneal and gastrocnemius veins when visible. The superficial great saphenous vein was also interrogated. Spectral Doppler was utilized to evaluate flow at rest and with distal augmentation maneuvers in the common femoral, femoral and popliteal veins. COMPARISON:  None Available. FINDINGS: RIGHT LOWER EXTREMITY Common Femoral Vein: No evidence of thrombus. Normal compressibility, respiratory phasicity and response to augmentation. Saphenofemoral Junction: No evidence of thrombus. Normal compressibility and flow on color Doppler imaging. Profunda Femoral Vein: No evidence of thrombus. Normal compressibility and flow on color Doppler imaging. Femoral Vein: No evidence of thrombus. Normal compressibility, respiratory phasicity and response to augmentation. Popliteal Vein: No evidence of thrombus. Normal compressibility, respiratory phasicity and response to augmentation. Calf Veins: No evidence of thrombus. Normal compressibility and flow on color Doppler imaging. Superficial Great Saphenous Vein: No evidence of thrombus. Normal compressibility. Venous Reflux:  None. Other Findings:  None. LEFT LOWER EXTREMITY Common Femoral Vein: No evidence of thrombus. Normal compressibility, respiratory phasicity and response to augmentation. Saphenofemoral Junction: No evidence of thrombus. Normal compressibility and flow on color Doppler imaging. Profunda Femoral Vein: No evidence of thrombus. Normal compressibility and flow on color Doppler imaging. Femoral Vein: No evidence of thrombus. Normal compressibility, respiratory phasicity and response to  augmentation. Popliteal Vein: No evidence of thrombus. Normal compressibility, respiratory phasicity and response to augmentation. Calf Veins: No evidence of  thrombus. Normal compressibility and flow on color Doppler imaging. Superficial Great Saphenous Vein: No evidence of thrombus. Normal compressibility. Venous Reflux:  None. Other Findings:  None. IMPRESSION: No evidence of deep venous thrombosis in either lower extremity. Electronically Signed   By: Malachy Moan M.D.   On: 12/13/2022 14:57   X-ray chest PA or AP  Result Date: 12/13/2022 CLINICAL DATA:  Digestive heart failure. EXAM: CHEST  1 VIEW COMPARISON:  June 14, 2022. FINDINGS: Stable cardiomediastinal silhouette. Bilateral diffuse reticular opacities are noted, but acute superimposed edema or atypical inflammation can not be excluded. Bony thorax is unremarkable. IMPRESSION: Bilateral diffuse reticular opacities are again noted as described above. Electronically Signed   By: Lupita Raider M.D.   On: 12/13/2022 11:26     Medications:    sodium chloride     furosemide (LASIX) 200 mg in dextrose 5 % 100 mL (2 mg/mL) infusion 6 mg/hr (12/13/22 1721)    amLODipine  5 mg Oral Daily   aspirin EC  81 mg Oral Daily   calcitRIOL  0.25 mcg Oral Daily   heparin  5,000 Units Subcutaneous Q8H   insulin aspart  0-20 Units Subcutaneous TID WC   insulin glargine-yfgn  25 Units Subcutaneous QHS   isosorbide mononitrate  30 mg Oral Daily   metoprolol succinate  50 mg Oral Daily   pravastatin  40 mg Oral q1800   sodium chloride flush  3 mL Intravenous Q12H   tamsulosin  0.4 mg Oral QPC breakfast   sodium chloride, acetaminophen, albuterol, nitroGLYCERIN, ondansetron (ZOFRAN) IV, sodium chloride flush  Assessment/ Plan:  82 y.o. male with past medical history of long-standing diabetes mellitus type 2, hypertension, hyperlipidemia, allergic rhinitis, history of recurrent Bell's palsy, low testosterone levels, nephrolithiasis with right UPJ obstruction status post laser lithotripsy, left UVJ obstruction status post ureteroscopy with stone removal and stent placement, left upper extremity AV  fistula 09/19/2022, and obstructive sleep apnea who was admitted with chest pain and acute on chronic diastolic heart failure.  1.  Diabetes mellitus type 2 with chronic kidney disease/chronic kidney disease stage IV/proteinuria.  Patient's renal function is similar to what it was in the office recently.  Continue Lasix drip at 6 mg/h.  No urgent indication for dialysis at the moment.  2.  Acute on chronic diastolic heart failure.  Appreciate cardiology input.  Has been started on Lasix 6 mg/h.  We will continue this for now.  3.  Hypertension.  Maintain the patient on amlodipine metoprolol, and Imdur.  4.  Anemia of chronic kidney disease.  Lab Results  Component Value Date   HGB 9.9 (L) 12/13/2022   Hemoglobin 9.9.  Hold off on Retacrit for now.     LOS: 1 Addie Alonge 5/25/20249:46 AM

## 2022-12-14 NOTE — Evaluation (Addendum)
Occupational Therapy Evaluation Patient Details Name: James Clarke MRN: [id number] DOB: 1941/01/15 Today's Date: 12/14/2022   History of Present Illness James Clarke Iwanicki is a 82 y.o. male with medical history significant of HFpEF, CAD, CKD stage IV, type 2 diabetes, hypertension, hyperlipidemia, anemia of chronic kidney disease, BPH, who presents to the ED due to increased shortness of breath and chest pain.   Clinical Impression   Patient agreeable to OT evaluation. Spouse present. Pt presenting with decreased independence in self care, balance, functional mobility/transfers, and endurance. PTA pt lived with spouse, received assistance for bathing/dressing, and was Mod I-I for functional mobility with/without an AD. Pt currently functioning at Mod I for bed mobility, CGA-supervision for STS from EOB, and supervision for functional mobility at room level without an AD. Pt engaged in ~5 min of sinkside grooming tasks with set up-supervision. Pt endorsed DOE with minimal activity (SpO2 mid 80s on RA), however, improved quickly with a seated rest break and PLB. Pt will benefit from skilled acute OT services to address deficits noted below. OT recommends ongoing therapy upon discharge to maximize safety and independence with ADLs, decrease fall risk, decrease caregiver burden, and promote return to PLOF.      Recommendations for follow up therapy are one component of a multi-disciplinary discharge planning process, led by the attending physician.  Recommendations may be updated based on patient status, additional functional criteria and insurance authorization.   Assistance Recommended at Discharge Intermittent Supervision/Assistance  Patient can return home with the following A little help with walking and/or transfers;A little help with bathing/dressing/bathroom;Assistance with cooking/housework;Assist for transportation;Help with stairs or ramp for entrance    Functional Status Assessment   Patient has had a recent decline in their functional status and demonstrates the ability to make significant improvements in function in a reasonable and predictable amount of time.  Equipment Recommendations  None recommended by OT    Recommendations for Other Services       Precautions / Restrictions Precautions Precautions: Fall Restrictions Weight Bearing Restrictions: No      Mobility Bed Mobility Overal bed mobility: Modified Independent             General bed mobility comments: utilizing bed features    Transfers Overall transfer level: Needs assistance Equipment used: None Transfers: Sit to/from Stand Sit to Stand: Supervision, Min guard (from EOB)           General transfer comment: increased time/effort, multiple attempts before successful but no physical assistance required      Balance Overall balance assessment: Mild deficits observed, not formally tested         ADL either performed or assessed with clinical judgement   ADL Overall ADL's : Needs assistance/impaired     Grooming: Set up;Supervision/safety;Standing;Wash/dry face;Oral care    Grooming Details (indicate cue type and reason): tolerated sinkside grooming for ~5 min     Toilet Transfer: Personnel officer Details (indicate cue type and reason): simulated with STS from EOB, no AD         Functional mobility during ADLs: Supervision/safety (to take several steps to/from bedside sink, no AD)       Vision Patient Visual Report: No change from baseline       Perception     Praxis      Pertinent Vitals/Pain Pain Assessment Pain Assessment: Faces Faces Pain Scale: Hurts a little bit Pain Location: lower back, chest tightness Pain Descriptors / Indicators: Aching, Discomfort, Tightness Pain Intervention(s): Limited activity  within patient's tolerance, Monitored during session, Repositioned     Hand Dominance Right   Extremity/Trunk  Assessment Upper Extremity Assessment Upper Extremity Assessment: Overall WFL for tasks assessed (pt/wife endorse chronic R shoulder impairment, shoulder flexion AROM limited to <90 deg)   Lower Extremity Assessment Lower Extremity Assessment: Overall WFL for tasks assessed   Cervical / Trunk Assessment Cervical / Trunk Assessment: Normal   Communication Communication Communication: No difficulties   Cognition Arousal/Alertness: Awake/alert Behavior During Therapy: WFL for tasks assessed/performed Overall Cognitive Status: Within Functional Limits for tasks assessed         General Comments  Pt received on RA, SpO2 desatting to mid 80s with activity. Pt endorsed DOE with minimal activity. SpO2 improved quickly (>90%) with seated rest break and PLB.    Exercises Other Exercises Other Exercises: OT provided education re: role of OT, OT POC, post acute recs, sitting up for all meals, EOB/OOB mobility with assistance, home/fall safety, energy conservation techniques (rest breaks, PLB)    Shoulder Instructions      Home Living Family/patient expects to be discharged to:: Private residence Living Arrangements: Spouse/significant other Available Help at Discharge: Family;Available 24 hours/day Type of Home: House Home Access: Stairs to enter [employer] of Steps: 3-4 Entrance Stairs-Rails: Left (from garage) Home Layout: One level     Bathroom Shower/Tub: Producer, television/film/video: Handicapped height Bathroom Accessibility: Yes   Home Equipment: Cane - single point;Grab bars - tub/shower;Shower seat - built in;Other (comment) (lift chair and adjustable bed)          Prior Functioning/Environment Prior Level of Function : Independent/Modified Independent;Driving             Mobility Comments: Indep in home no AD, uses SPC in community and outside ADLs Comments: Wife assists with some dressing and bathing due to chronic R shoulder impairment         OT Problem List: Decreased strength;Decreased range of motion;Decreased activity tolerance;Impaired balance (sitting and/or standing);Cardiopulmonary status limiting activity;Pain      OT Treatment/Interventions: Self-care/ADL training;Therapeutic exercise;Energy conservation;DME and/or AE instruction;Therapeutic activities;Patient/family education;Balance training    OT Goals(Current goals can be found in the care plan section) Acute Rehab OT Goals Patient Stated Goal: return home OT Goal Formulation: With patient/family Time For Goal Achievement: 12/28/22 Potential to Achieve Goals: Good   OT Frequency: Min 2X/week    Co-evaluation              AM-PAC OT "6 Clicks" Daily Activity     Outcome Measure Help from another person eating meals?: None Help from another person taking care of personal grooming?: A Little Help from another person toileting, which includes using toliet, bedpan, or urinal?: A Little Help from another person bathing (including washing, rinsing, drying)?: A Little Help from another person to put on and taking off regular upper body clothing?: A Little Help from another person to put on and taking off regular lower body clothing?: A Little 6 Click Score: 19   End of Session Equipment Utilized During Treatment: Gait belt Nurse Communication: Mobility status;Other (comment) (O2)  Activity Tolerance: Patient tolerated treatment well;Other (comment) (limited by SOB) Patient left: in bed;with call bell/phone within reach;with bed alarm set;with family/visitor present  OT Visit Diagnosis: Other abnormalities of gait and mobility (R26.89);Muscle weakness (generalized) (M62.81);Pain                Time: 1610-9604 OT Time Calculation (min): 16 min Charges:  OT General Charges $OT Visit: 1  Visit OT Evaluation $OT Eval Low Complexity: 1 Low  [facility] MS, OTR/L ascom [phone]  12/14/22, 12:16 PM

## 2022-12-14 NOTE — Assessment & Plan Note (Signed)
Suspect demand ischemia from heart failure and chronic kidney disease.

## 2022-12-14 NOTE — Assessment & Plan Note (Signed)
Continue IV Lasix drip.  Patient also on Toprol.

## 2022-12-14 NOTE — Hospital Course (Signed)
82 y.o. male with medical history significant of HFpEF, CAD, CKD stage IV, type 2 diabetes, hypertension, hyperlipidemia, anemia of chronic kidney disease, BPH, who presents to the ED due to increased shortness of breath and chest pain   James Clarke states that after his hospitalization in November 2023, he had been doing well with his diuretics and volume status.  However 2 weeks ago, he had a rapid worsening in his dyspnea on exertion and shortness of breath at rest.  He has not had any increased urine output since increasing his diuretic regimen as instructed by his nephrologist.  In addition, his weight has stayed stable around 307 pounds.  He endorses chest pain, that particularly occurs when he feels very dyspneic but does not occur without dyspnea.  He denies any palpitations.  He denies any lower extremity swelling that is worse than usual.   Per chart review, patient has had relatively stable CKD stage IV, however worsening hypervolemia that has been difficult to manage.  Patient was seen on 12/09/2022 by nephrology at which time he was started on IV Lasix 80 mg twice a week with oral Lasix on the rest of the days.  He had an injection on 8/21.  He presented today for his second injection.  At the time, RN noted patient's breathing was labored and patient was reporting midsternal chest pain.  He was given one-time dose of sublingual nitro and felt slightly better.  Cardiology was consulted and evaluated patient in shorts today.  Patient was recommended to go to the ED.  5/25.  Patient on Lasix drip.  Still not feeling better with regards to his shortness of breath. 5/26.  Patient feeling a little bit better today.  Continue Lasix drip. 5/27.  Continue Lasix drip. 5/28.  Patient feeling better and wanting to go home.  Patient's weight upon discharge 298.9 pounds or 135.6 kg.  I advised him to weigh on a scale at home and keep track from those weights.  If you gain 5 pounds in a week or 3 pounds in a day  call the CHF clinic.  Nephrology wanted you back on Lasix 80 mg in the morning and 40 mg in the evening.

## 2022-12-14 NOTE — TOC Initial Note (Signed)
Transition of Care [facility]) - Initial/Assessment Note    Patient Details  Name: James Clarke MRN: [id number] Date of Birth: 19-Mar-1941  Transition of Care [facility]) CM/SW Contact:    Kemper Durie, RN Phone Number: 12/14/2022, 4:22 PM  Clinical Narrative:                  Met with patient at bedside, lives at home with wife.  PCP is Dr. Jolayne Haines, uses [facility] for medications.  Denies need for transportation resources.  Has CPAP and walker at home.  Agrees to recommendation for HHPT/OT, state he has used [facility] in the past and would like to use them again.  Referral placed to [facility], awaiting approval or denial of services.    Expected Discharge Plan: Home w Home Health Services Barriers to Discharge: Continued Medical Work up   Patient Goals and CMS Choice            Expected Discharge Plan and Services     Post Acute Care Choice: Home Health Living arrangements for the past 2 months: Single Family Home                                      Prior Living Arrangements/Services Living arrangements for the past 2 months: Single Family Home Lives with:: Spouse Patient language and need for interpreter reviewed:: Yes Do you feel safe going back to the place where you live?: Yes      Need for Family Participation in Patient Care: Yes (Comment) Care giver support system in place?: Yes (comment) Current home services: DME Criminal Activity/Legal Involvement Pertinent to Current Situation/Hospitalization: No - Comment as needed  Activities of Daily Living Home Assistive Devices/Equipment: Cane (specify quad or straight) ADL Screening (condition at time of admission) Patient's cognitive ability adequate to safely complete daily activities?: Yes Is the patient deaf or have difficulty hearing?: No Does the patient have difficulty seeing, even when wearing glasses/contacts?: No Does the patient have difficulty concentrating, remembering, or making decisions?:  No Patient able to express need for assistance with ADLs?: Yes Does the patient have difficulty dressing or bathing?: Yes Independently performs ADLs?: No Does the patient have difficulty walking or climbing stairs?: Yes Weakness of Legs: Both Weakness of Arms/Hands: None  Permission Sought/Granted                  Emotional Assessment Appearance:: Appears stated age Attitude/Demeanor/Rapport: Engaged Affect (typically observed): Appropriate Orientation: : Oriented to Self, Oriented to Place, Oriented to  Time, Oriented to Situation   Psych Involvement: No (comment)  Admission diagnosis:  Acute on chronic heart failure (HCC) [I50.9] Dyspnea, unspecified type [R06.00] Chest pain, unspecified type [R07.9] Patient Active Problem List   Diagnosis Date Noted   Myocardial injury 12/14/2022   Goals of care, counseling/discussion 12/13/2022   Chronic kidney disease (CKD), stage IV (severe) (HCC) 08/23/2022   Acute on chronic diastolic CHF (congestive heart failure) (HCC) 06/12/2022   Swelling of limb 04/30/2022   Obstructive uropathy 03/10/2022   Diarrhea 03/04/2022   Skin irritation 03/01/2022   UTI (urinary tract infection) 02/28/2022   Hydronephrosis of left kidney    Flank pain 02/25/2022   Chronic diastolic CHF (congestive heart failure) (HCC) 02/25/2022   Acute renal failure superimposed on chronic kidney disease (HCC) 02/25/2022   Coronary artery disease involving native coronary artery of native heart 01/10/2022   Right rotator cuff tear arthropathy  06/25/2021   Chronic right shoulder pain 06/25/2021   Localized osteoarthritis of right shoulder 06/25/2021   Chronic pain syndrome 06/25/2021   Acute systolic CHF (congestive heart failure) (HCC) 10/03/2020   Kidney stones 02/16/2020   Anemia in chronic kidney disease 07/26/2019   Chronic kidney disease, stage III (moderate) (HCC) 07/26/2019   Edema of lower extremity 07/26/2019   Malignant essential hypertension  07/26/2019   Secondary hyperparathyroidism of renal origin (HCC) 07/26/2019   Left ureteral calculus 07/26/2019   Old complex tear of medial meniscus of right knee 10/07/2018   Primary osteoarthritis of right knee 10/07/2018   Fatty liver 04/16/2018   Aortic atherosclerosis (HCC) 04/16/2018   Obesity, Class III, BMI 40-49.9 (morbid obesity) (HCC) 11/11/2014   Microalbuminuria 11/11/2014   Long-term insulin use (HCC) 11/11/2014   Perennial allergic rhinitis 09/12/2014   Type 2 diabetes mellitus with renal manifestations (HCC) 03/19/2014   Sleep apnea 03/19/2014   Hypertension 03/19/2014   Hyperlipemia 03/19/2014   PCP:  Marina Goodell, MD Pharmacy:   [facility] [phone] [place], [place] - [address] [place] [address] Phone: [phone] Fax: [phone]  [facility] [place], [place] - [address] [place] [address] Phone: [phone] Fax: [phone]  [id number], [place] - [address] AT [facility] [address] [facility] [place] [address] Phone: [phone] Fax: [phone]     Social Determinants of Health (SDOH) Social History: SDOH Screenings   Food Insecurity: No Food Insecurity (12/13/2022)  Housing: Patient Unable To Answer (12/13/2022)  Transportation Needs: No Transportation Needs (12/13/2022)  Utilities: Not At Risk (12/13/2022)  Depression (PHQ2-9): Low Risk  (10/30/2021)  Tobacco Use: Low Risk  (11/21/2022)   SDOH Interventions:     Readmission Risk Interventions    02/27/2022    4:22 PM  Readmission Risk Prevention Plan  Transportation Screening Complete  HRI or Home Care Consult Complete  Palliative Care Screening Not Applicable  Medication Review (RN Care Manager) Complete

## 2022-12-14 NOTE — Assessment & Plan Note (Signed)
With current height and weight in computer BMI 44.05.  Asking nursing staff to get a current weight.

## 2022-12-14 NOTE — Progress Notes (Signed)
[facility]    SUBJECTIVE: Patient states she still feels short of breath some mid chest tightness with shortness of breath no palpitations or tachycardia   Vitals:   12/13/22 2115 12/13/22 2307 12/14/22 0445 12/14/22 0857  BP:  (!) 141/64 (!) 141/63 (!) 160/66  Pulse:  70 67 78  Resp:  15 16 18   Temp:  98 F (36.7 C) 97.7 F (36.5 C) 97.9 F (36.6 C)  SpO2: 98% 93% 94% 94%  Weight:      Height:         Intake/Output Summary (Last 24 hours) at 12/14/2022 1191 Last data filed at 12/14/2022 0445 Gross per 24 hour  Intake 120 ml  Output 2075 ml  Net -1955 ml      PHYSICAL EXAM  General: Well developed, well nourished, in no acute distress HEENT:  Normocephalic and atramatic Neck:  No JVD.  Lungs: Clear bilaterally to auscultation and percussion. Heart: HRRR . Normal S1 and S2 without gallops or murmurs.  Abdomen: Bowel sounds are positive, abdomen soft and non-tender  Msk:  Back normal, normal gait. Normal strength and tone for age. Extremities: No clubbing, cyanosis or edema.   Neuro: Alert and oriented X 3. Psych:  Good affect, responds appropriately   LABS: Basic Metabolic Panel: Recent Labs    12/13/22 1025 12/14/22 0427  NA 139 142  K 3.7 3.5  CL 103 105  CO2 26 27  GLUCOSE 193* 212*  BUN 74* 71*  CREATININE 3.67* 3.56*  CALCIUM 9.2 8.6*  MG  --  2.3  PHOS 4.4  --    Liver Function Tests: Recent Labs    12/13/22 1025  ALBUMIN 2.8*   No results for input(s): "LIPASE", "AMYLASE" in the last 72 hours. CBC: Recent Labs    12/13/22 1400  WBC 14.3*  HGB 9.9*  HCT 32.4*  MCV 92.0  PLT 337   Cardiac Enzymes: No results for input(s): "CKTOTAL", "CKMB", "CKMBINDEX", "TROPONINI" in the last 72 hours. BNP: Invalid input(s): "POCBNP" D-Dimer: No results for input(s): "DDIMER" in the last 72 hours. Hemoglobin A1C: No results for input(s): "HGBA1C" in the last 72 hours. Fasting Lipid Panel: No results for input(s): "CHOL", "HDL", "LDLCALC",  "TRIG", "CHOLHDL", "LDLDIRECT" in the last 72 hours. Thyroid Function Tests: No results for input(s): "TSH", "T4TOTAL", "T3FREE", "THYROIDAB" in the last 72 hours.  Invalid input(s): "FREET3" Anemia Panel: No results for input(s): "VITAMINB12", "FOLATE", "FERRITIN", "TIBC", "IRON", "RETICCTPCT" in the last 72 hours.  US Venous Img Lower Bilateral  Result Date: 12/13/2022 CLINICAL DATA:  Dyspnea EXAM: BILATERAL LOWER EXTREMITY VENOUS DOPPLER ULTRASOUND TECHNIQUE: Gray-scale sonography with graded compression, as well as color Doppler and duplex ultrasound were performed to evaluate the lower extremity deep venous systems from the level of the common femoral vein and including the common femoral, femoral, profunda femoral, popliteal and calf veins including the posterior tibial, peroneal and gastrocnemius veins when visible. The superficial great saphenous vein was also interrogated. Spectral Doppler was utilized to evaluate flow at rest and with distal augmentation maneuvers in the common femoral, femoral and popliteal veins. COMPARISON:  None Available. FINDINGS: RIGHT LOWER EXTREMITY Common Femoral Vein: No evidence of thrombus. Normal compressibility, respiratory phasicity and response to augmentation. Saphenofemoral Junction: No evidence of thrombus. Normal compressibility and flow on color Doppler imaging. Profunda Femoral Vein: No evidence of thrombus. Normal compressibility and flow on color Doppler imaging. Femoral Vein: No evidence of thrombus. Normal compressibility, respiratory phasicity and response to augmentation. Popliteal Vein: No evidence  of thrombus. Normal compressibility, respiratory phasicity and response to augmentation. Calf Veins: No evidence of thrombus. Normal compressibility and flow on color Doppler imaging. Superficial Great Saphenous Vein: No evidence of thrombus. Normal compressibility. Venous Reflux:  None. Other Findings:  None. LEFT LOWER EXTREMITY Common Femoral Vein: No  evidence of thrombus. Normal compressibility, respiratory phasicity and response to augmentation. Saphenofemoral Junction: No evidence of thrombus. Normal compressibility and flow on color Doppler imaging. Profunda Femoral Vein: No evidence of thrombus. Normal compressibility and flow on color Doppler imaging. Femoral Vein: No evidence of thrombus. Normal compressibility, respiratory phasicity and response to augmentation. Popliteal Vein: No evidence of thrombus. Normal compressibility, respiratory phasicity and response to augmentation. Calf Veins: No evidence of thrombus. Normal compressibility and flow on color Doppler imaging. Superficial Great Saphenous Vein: No evidence of thrombus. Normal compressibility. Venous Reflux:  None. Other Findings:  None. IMPRESSION: No evidence of deep venous thrombosis in either lower extremity. Electronically Signed   By: Malachy Moan M.D.   On: 12/13/2022 14:57   X-ray chest PA or AP  Result Date: 12/13/2022 CLINICAL DATA:  Digestive heart failure. EXAM: CHEST  1 VIEW COMPARISON:  June 14, 2022. FINDINGS: Stable cardiomediastinal silhouette. Bilateral diffuse reticular opacities are noted, but acute superimposed edema or atypical inflammation can not be excluded. Bony thorax is unremarkable. IMPRESSION: Bilateral diffuse reticular opacities are again noted as described above. Electronically Signed   By: Lupita Raider M.D.   On: 12/13/2022 11:26     Echo previously preserved left ventricular function  TELEMETRY: Normal sinus rhythm rate of 70 nonspecific EKG changes:  ASSESSMENT AND PLAN:  Principal Problem:   Acute on chronic heart failure (HCC) Active Problems:   Type 2 diabetes mellitus with renal manifestations (HCC)   Sleep apnea   Hypertension   Chest pain   Chronic kidney disease (CKD), stage IV (severe) (HCC)   Goals of care, counseling/discussion    Plan Continue to follow failure diastolic dysfunction continue diuresis with Lasix  drip Chronic renal insufficiency stage IV followed by nephrology Diabetes type 2 uncomplicated continue current therapy Obstructive sleep apnea  CPAP weight loss Hypertension reasonably controlled continue aggressive blood pressure management to control Continue aggressive medical therapy no indication for invasive procedures   [name], MD, 12/14/2022 9:56 AM

## 2022-12-15 DIAGNOSIS — I5033 Acute on chronic diastolic (congestive) heart failure: Secondary | ICD-10-CM | POA: Diagnosis not present

## 2022-12-15 DIAGNOSIS — G4733 Obstructive sleep apnea (adult) (pediatric): Secondary | ICD-10-CM | POA: Diagnosis not present

## 2022-12-15 DIAGNOSIS — E1122 Type 2 diabetes mellitus with diabetic chronic kidney disease: Secondary | ICD-10-CM | POA: Diagnosis not present

## 2022-12-15 DIAGNOSIS — N184 Chronic kidney disease, stage 4 (severe): Secondary | ICD-10-CM | POA: Diagnosis not present

## 2022-12-15 LAB — BASIC METABOLIC PANEL
Anion gap: 10 (ref 5–15)
BUN: 80 mg/dL — ABNORMAL HIGH (ref 8–23)
CO2: 26 mmol/L (ref 22–32)
Calcium: 8.2 mg/dL — ABNORMAL LOW (ref 8.9–10.3)
Chloride: 104 mmol/L (ref 98–111)
Creatinine, Ser: 3.85 mg/dL — ABNORMAL HIGH (ref 0.61–1.24)
GFR, Estimated: 15 mL/min — ABNORMAL LOW (ref >60)
Glucose, Bld: 216 mg/dL — ABNORMAL HIGH (ref 70–99)
Potassium: 3.1 mmol/L — ABNORMAL LOW (ref 3.5–5.1)
Sodium: 140 mmol/L (ref 135–145)

## 2022-12-15 MED ORDER — INSULIN GLARGINE-YFGN 100 UNIT/ML ~~LOC~~ SOLN
30.0000 [IU] | Freq: Every day | SUBCUTANEOUS | Status: DC
  Administered 2022-12-15: 30 [IU] via SUBCUTANEOUS
  Filled 2022-12-15: qty 0.3

## 2022-12-15 MED ORDER — POTASSIUM CHLORIDE CRYS ER 20 MEQ PO TBCR
40.0000 meq | EXTENDED_RELEASE_TABLET | Freq: Once | ORAL | Status: AC
  Administered 2022-12-15: 40 meq via ORAL
  Filled 2022-12-15: qty 2

## 2022-12-15 MED ORDER — INSULIN ASPART 100 UNIT/ML IJ SOLN
2.0000 [IU] | Freq: Three times a day (TID) | INTRAMUSCULAR | Status: DC
  Administered 2022-12-15: 2 [IU] via SUBCUTANEOUS
  Filled 2022-12-15: qty 1

## 2022-12-15 MED ORDER — INSULIN ASPART 100 UNIT/ML IJ SOLN
0.0000 [IU] | Freq: Every day | INTRAMUSCULAR | Status: DC
  Administered 2022-12-15 – 2022-12-16 (×2): 2 [IU] via SUBCUTANEOUS
  Filled 2022-12-15 (×2): qty 1

## 2022-12-15 MED ORDER — INSULIN ASPART 100 UNIT/ML IJ SOLN
0.0000 [IU] | Freq: Three times a day (TID) | INTRAMUSCULAR | Status: DC
  Administered 2022-12-15: 11 [IU] via SUBCUTANEOUS
  Administered 2022-12-16: 8 [IU] via SUBCUTANEOUS
  Administered 2022-12-16: 5 [IU] via SUBCUTANEOUS
  Administered 2022-12-16: 11 [IU] via SUBCUTANEOUS
  Administered 2022-12-17: 5 [IU] via SUBCUTANEOUS
  Administered 2022-12-17: 8 [IU] via SUBCUTANEOUS
  Filled 2022-12-15 (×5): qty 1

## 2022-12-15 NOTE — Progress Notes (Signed)
[facility]  ROUNDING NOTE   Subjective:  Patient well-known to James Clarke as we follow him for diabetes mellitus type 2 with chronic kidney disease, chronic kidney disease stage IV. He has been troubled with volume overload recently. We set him up for outpatient Lasix injection 80 mg IV times twice a week for the next 2 weeks.  When he presented for his second injection he unfortunately became significantly short of breath. He also had chest pain. He was referred over to the emergency department.  Update: Patient appears to be doing well on Lasix drip.  Respiratory status slowly improving.  Good urine output noted.   Objective:  Vital signs in last 24 hours:  Temp:  [97.6 F (36.4 C)-98.1 F (36.7 C)] 97.6 F (36.4 C) (05/26 0748) Pulse Rate:  [65-73] 70 (05/26 0748) Resp:  [14-19] 18 (05/26 0748) BP: (138-173)/(66-78) 173/78 (05/26 0748) SpO2:  [93 %-96 %] 94 % (05/26 0748)  Weight change:  Filed Weights   12/13/22 1320  Weight: (!) 139.3 kg    Intake/Output: I/O last 3 completed shifts: In: 720 [P.O.:720] Out: 2875 [Urine:2875]   Intake/Output this shift:  No intake/output data recorded.  Physical Exam: General: No acute distress  Head: Normocephalic, atraumatic. Moist oral mucosal membranes  Neck: Supple  Lungs:  Basilar rales, normal effort  Heart: S1S2 no rubs  Abdomen:  Soft, nontender, bowel sounds present  Extremities: Trace peripheral edema.  Neurologic: Awake, alert, following commands  Skin: No acute rash  Access: Left upper extremity AV fistula    Basic Metabolic Panel: Recent Labs  Lab 12/13/22 1025 12/14/22 0427 12/15/22 0430  NA 139 142 140  K 3.7 3.5 3.1*  CL 103 105 104  CO2 26 27 26   GLUCOSE 193* 212* 216*  BUN 74* 71* 80*  CREATININE 3.67* 3.56* 3.85*  CALCIUM 9.2 8.6* 8.2*  MG  --  2.3  --   PHOS 4.4  --   --      Liver Function Tests: Recent Labs  Lab 12/13/22 1025  ALBUMIN 2.8*    No results for input(s):  "LIPASE", "AMYLASE" in the last 168 hours. No results for input(s): "AMMONIA" in the last 168 hours.  CBC: Recent Labs  Lab 12/13/22 1400  WBC 14.3*  HGB 9.9*  HCT 32.4*  MCV 92.0  PLT 337     Cardiac Enzymes: No results for input(s): "CKTOTAL", "CKMB", "CKMBINDEX", "TROPONINI" in the last 168 hours.  BNP: Invalid input(s): "POCBNP"  CBG: Recent Labs  Lab 12/13/22 1802 12/13/22 2106  GLUCAP 291* 273*     Microbiology: Results for orders placed or performed during the hospital encounter of 06/12/22  Resp Panel by RT-PCR (Flu A&B, Covid) Anterior Nasal Swab     Status: None   Collection Time: 06/12/22  2:20 PM   Specimen: Anterior Nasal Swab  Result Value Ref Range Status   SARS Coronavirus 2 by RT PCR NEGATIVE NEGATIVE Final    Comment: (NOTE) SARS-CoV-2 target nucleic acids are NOT DETECTED.  The SARS-CoV-2 RNA is generally detectable in upper respiratory specimens during the acute phase of infection. The lowest concentration of SARS-CoV-2 viral copies this assay can detect is 138 copies/mL. A negative result does not preclude SARS-Cov-2 infection and should not be used as the sole basis for treatment or other patient management decisions. A negative result may occur with  improper specimen collection/handling, submission of specimen other than nasopharyngeal swab, presence of viral mutation(s) within the areas targeted by this assay, and  inadequate number of viral copies(<138 copies/mL). A negative result must be combined with clinical observations, patient history, and epidemiological information. The expected result is Negative.  Fact Sheet for Patients:  BloggerCourse.com  Fact Sheet for Healthcare Providers:  SeriousBroker.it  This test is no t yet approved or cleared by the [place] FDA and  has been authorized for detection and/or diagnosis of SARS-CoV-2 by FDA under an Emergency Use  Authorization (EUA). This EUA will remain  in effect (meaning this test can be used) for the duration of the COVID-19 declaration under Section 564(b)(1) of the Act, 21 U.S.C.section 360bbb-3(b)(1), unless the authorization is terminated  or revoked sooner.       Influenza A by PCR NEGATIVE NEGATIVE Final   Influenza B by PCR NEGATIVE NEGATIVE Final    Comment: (NOTE) The Xpert Xpress SARS-CoV-2/FLU/RSV plus assay is intended as an aid in the diagnosis of influenza from Nasopharyngeal swab specimens and should not be used as a sole basis for treatment. Nasal washings and aspirates are unacceptable for Xpert Xpress SARS-CoV-2/FLU/RSV testing.  Fact Sheet for Patients: BloggerCourse.com  Fact Sheet for Healthcare Providers: SeriousBroker.it  This test is not yet approved or cleared by the [place] FDA and has been authorized for detection and/or diagnosis of SARS-CoV-2 by FDA under an Emergency Use Authorization (EUA). This EUA will remain in effect (meaning this test can be used) for the duration of the COVID-19 declaration under Section 564(b)(1) of the Act, 21 U.S.C. section 360bbb-3(b)(1), unless the authorization is terminated or revoked.  Performed at [facility], [address] [place], [place] [address]     Coagulation Studies: No results for input(s): "LABPROT", "INR" in the last 72 hours.  Urinalysis: No results for input(s): "COLORURINE", "LABSPEC", "PHURINE", "GLUCOSEU", "HGBUR", "BILIRUBINUR", "KETONESUR", "PROTEINUR", "UROBILINOGEN", "NITRITE", "LEUKOCYTESUR" in the last 72 hours.  Invalid input(s): "APPERANCEUR"    Imaging: James Clarke Venous Img Lower Bilateral  Result Date: 12/13/2022 CLINICAL DATA:  Dyspnea EXAM: BILATERAL LOWER EXTREMITY VENOUS DOPPLER ULTRASOUND TECHNIQUE: Gray-scale sonography with graded compression, as well as color Doppler and duplex ultrasound were performed to evaluate the  lower extremity deep venous systems from the level of the common femoral vein and including the common femoral, femoral, profunda femoral, popliteal and calf veins including the posterior tibial, peroneal and gastrocnemius veins when visible. The superficial great saphenous vein was also interrogated. Spectral Doppler was utilized to evaluate flow at rest and with distal augmentation maneuvers in the common femoral, femoral and popliteal veins. COMPARISON:  None Available. FINDINGS: RIGHT LOWER EXTREMITY Common Femoral Vein: No evidence of thrombus. Normal compressibility, respiratory phasicity and response to augmentation. Saphenofemoral Junction: No evidence of thrombus. Normal compressibility and flow on color Doppler imaging. Profunda Femoral Vein: No evidence of thrombus. Normal compressibility and flow on color Doppler imaging. Femoral Vein: No evidence of thrombus. Normal compressibility, respiratory phasicity and response to augmentation. Popliteal Vein: No evidence of thrombus. Normal compressibility, respiratory phasicity and response to augmentation. Calf Veins: No evidence of thrombus. Normal compressibility and flow on color Doppler imaging. Superficial Great Saphenous Vein: No evidence of thrombus. Normal compressibility. Venous Reflux:  None. Other Findings:  None. LEFT LOWER EXTREMITY Common Femoral Vein: No evidence of thrombus. Normal compressibility, respiratory phasicity and response to augmentation. Saphenofemoral Junction: No evidence of thrombus. Normal compressibility and flow on color Doppler imaging. Profunda Femoral Vein: No evidence of thrombus. Normal compressibility and flow on color Doppler imaging. Femoral Vein: No evidence of thrombus. Normal compressibility, respiratory phasicity and response to  augmentation. Popliteal Vein: No evidence of thrombus. Normal compressibility, respiratory phasicity and response to augmentation. Calf Veins: No evidence of thrombus. Normal compressibility  and flow on color Doppler imaging. Superficial Great Saphenous Vein: No evidence of thrombus. Normal compressibility. Venous Reflux:  None. Other Findings:  None. IMPRESSION: No evidence of deep venous thrombosis in either lower extremity. Electronically Signed   By: James Moan M.D.   On: 12/13/2022 14:57   X-ray chest PA or AP  Result Date: 12/13/2022 CLINICAL DATA:  Digestive heart failure. EXAM: CHEST  1 VIEW COMPARISON:  June 14, 2022. FINDINGS: Stable cardiomediastinal silhouette. Bilateral diffuse reticular opacities are noted, but acute superimposed edema or atypical inflammation can not be excluded. Bony thorax is unremarkable. IMPRESSION: Bilateral diffuse reticular opacities are again noted as described above. Electronically Signed   By: Lupita Raider M.D.   On: 12/13/2022 11:26     Medications:    sodium chloride     furosemide (LASIX) 200 mg in dextrose 5 % 100 mL (2 mg/mL) infusion 6 mg/hr (12/14/22 2137)    amLODipine  5 mg Oral Daily   aspirin EC  81 mg Oral Daily   calcitRIOL  0.25 mcg Oral Daily   heparin  5,000 Units Subcutaneous Q8H   insulin aspart  0-20 Units Subcutaneous TID WC   insulin glargine-yfgn  25 Units Subcutaneous QHS   isosorbide mononitrate  30 mg Oral Daily   metoprolol succinate  50 mg Oral Daily   pravastatin  40 mg Oral q1800   sodium chloride flush  3 mL Intravenous Q12H   tamsulosin  0.4 mg Oral QPC breakfast   sodium chloride, acetaminophen, albuterol, nitroGLYCERIN, ondansetron (ZOFRAN) IV, sodium chloride flush  Assessment/ Plan:  82 y.o. male with past medical history of long-standing diabetes mellitus type 2, hypertension, hyperlipidemia, allergic rhinitis, history of recurrent Bell's palsy, low testosterone levels, nephrolithiasis with right UPJ obstruction status post laser lithotripsy, left UVJ obstruction status post ureteroscopy with stone removal and stent placement, left upper extremity AV fistula 09/19/2022, and obstructive  sleep apnea who was admitted with chest pain and acute on chronic diastolic heart failure.  1.  Diabetes mellitus type 2 with chronic kidney disease/chronic kidney disease stage IV/proteinuria.  Continue Lasix drip at 6 mg/h for now.  No immediate need for dialysis.  2.  Acute on chronic diastolic heart failure.  Clinically improved as compared to admission.  Continue Lasix drip at 6 mg/h for now.  3.  Hypertension.  Maintain the patient on amlodipine metoprolol, and Imdur.  4.  Anemia of chronic kidney disease.  Lab Results  Component Value Date   HGB 9.9 (L) 12/13/2022   Hemoglobin 9.9.  Hold off on Retacrit for now.     LOS: 2 Jaecob Lowden 5/26/20249:49 AM

## 2022-12-15 NOTE — Progress Notes (Signed)
Pt glucose 316. Patient glucose lab values have not been transferring to [facility] charting system. Have changed ID bracelet, clinical performer and clinical machine. Will have follow up on issue and pass along problem

## 2022-12-15 NOTE — Progress Notes (Signed)
[facility] Cardiology    SUBJECTIVE: Patient states he feels somewhat better less short of breath with activity has ambulated with physical therapy sitting at the edge of the bed eating lunch with no significant symptoms   Vitals:   12/14/22 2337 12/15/22 0335 12/15/22 0748 12/15/22 1112  BP: 138/66 138/69 (!) 173/78 (!) 147/65  Pulse: 66 65 70   Resp: 19 19 18    Temp: 97.6 F (36.4 C) 97.6 F (36.4 C) 97.6 F (36.4 C)   SpO2: 93% 96% 94% 96%  Weight:      Height:         Intake/Output Summary (Last 24 hours) at 12/15/2022 1147 Last data filed at 12/15/2022 0335 Gross per 24 hour  Intake 480 ml  Output 1175 ml  Net -695 ml      PHYSICAL EXAM  General: Well developed, well nourished, in no acute distress HEENT:  Normocephalic and atramatic Neck:  No JVD.  Lungs: Clear bilaterally to auscultation and percussion. Heart: HRRR . Normal S1 and S2 without gallops or murmurs.  Abdomen: Bowel sounds are positive, abdomen soft and non-tender  Msk:  Back normal, normal gait. Normal strength and tone for age. Extremities: No clubbing, cyanosis or edema.   Neuro: Alert and oriented X 3. Psych:  Good affect, responds appropriately   LABS: Basic Metabolic Panel: Recent Labs    12/13/22 1025 12/14/22 0427 12/15/22 0430  NA 139 142 140  K 3.7 3.5 3.1*  CL 103 105 104  CO2 26 27 26   GLUCOSE 193* 212* 216*  BUN 74* 71* 80*  CREATININE 3.67* 3.56* 3.85*  CALCIUM 9.2 8.6* 8.2*  MG  --  2.3  --   PHOS 4.4  --   --    Liver Function Tests: Recent Labs    12/13/22 1025  ALBUMIN 2.8*   No results for input(s): "LIPASE", "AMYLASE" in the last 72 hours. CBC: Recent Labs    12/13/22 1400  WBC 14.3*  HGB 9.9*  HCT 32.4*  MCV 92.0  PLT 337   Cardiac Enzymes: No results for input(s): "CKTOTAL", "CKMB", "CKMBINDEX", "TROPONINI" in the last 72 hours. BNP: Invalid input(s): "POCBNP" D-Dimer: No results for input(s): "DDIMER" in the last 72 hours. Hemoglobin A1C: No results  for input(s): "HGBA1C" in the last 72 hours. Fasting Lipid Panel: No results for input(s): "CHOL", "HDL", "LDLCALC", "TRIG", "CHOLHDL", "LDLDIRECT" in the last 72 hours. Thyroid Function Tests: No results for input(s): "TSH", "T4TOTAL", "T3FREE", "THYROIDAB" in the last 72 hours.  Invalid input(s): "FREET3" Anemia Panel: No results for input(s): "VITAMINB12", "FOLATE", "FERRITIN", "TIBC", "IRON", "RETICCTPCT" in the last 72 hours.  US Venous Img Lower Bilateral  Result Date: 12/13/2022 CLINICAL DATA:  Dyspnea EXAM: BILATERAL LOWER EXTREMITY VENOUS DOPPLER ULTRASOUND TECHNIQUE: Gray-scale sonography with graded compression, as well as color Doppler and duplex ultrasound were performed to evaluate the lower extremity deep venous systems from the level of the common femoral vein and including the common femoral, femoral, profunda femoral, popliteal and calf veins including the posterior tibial, peroneal and gastrocnemius veins when visible. The superficial great saphenous vein was also interrogated. Spectral Doppler was utilized to evaluate flow at rest and with distal augmentation maneuvers in the common femoral, femoral and popliteal veins. COMPARISON:  None Available. FINDINGS: RIGHT LOWER EXTREMITY Common Femoral Vein: No evidence of thrombus. Normal compressibility, respiratory phasicity and response to augmentation. Saphenofemoral Junction: No evidence of thrombus. Normal compressibility and flow on color Doppler imaging. Profunda Femoral Vein: No evidence of thrombus. Normal  compressibility and flow on color Doppler imaging. Femoral Vein: No evidence of thrombus. Normal compressibility, respiratory phasicity and response to augmentation. Popliteal Vein: No evidence of thrombus. Normal compressibility, respiratory phasicity and response to augmentation. Calf Veins: No evidence of thrombus. Normal compressibility and flow on color Doppler imaging. Superficial Great Saphenous Vein: No evidence of  thrombus. Normal compressibility. Venous Reflux:  None. Other Findings:  None. LEFT LOWER EXTREMITY Common Femoral Vein: No evidence of thrombus. Normal compressibility, respiratory phasicity and response to augmentation. Saphenofemoral Junction: No evidence of thrombus. Normal compressibility and flow on color Doppler imaging. Profunda Femoral Vein: No evidence of thrombus. Normal compressibility and flow on color Doppler imaging. Femoral Vein: No evidence of thrombus. Normal compressibility, respiratory phasicity and response to augmentation. Popliteal Vein: No evidence of thrombus. Normal compressibility, respiratory phasicity and response to augmentation. Calf Veins: No evidence of thrombus. Normal compressibility and flow on color Doppler imaging. Superficial Great Saphenous Vein: No evidence of thrombus. Normal compressibility. Venous Reflux:  None. Other Findings:  None. IMPRESSION: No evidence of deep venous thrombosis in either lower extremity. Electronically Signed   By: Malachy Moan M.D.   On: 12/13/2022 14:57     Echo   TELEMETRY: Normal sinus rhythm nonspecific ST-T changes rate of 65:  ASSESSMENT AND PLAN:  Principal Problem:   Acute on chronic diastolic CHF (congestive heart failure) (HCC) Active Problems:   Type 2 diabetes mellitus with renal manifestations (HCC)   Sleep apnea   Obesity, Class III, BMI 40-49.9 (morbid obesity) (HCC)   Hypertension   Chronic kidney disease (CKD), stage IV (severe) (HCC)   Goals of care, counseling/discussion   Myocardial injury    Plan Continue telemetry EKGs troponin Maintain Lasix drip to help with diuresis Appreciate nephrology input for renal insufficiency and assisting in diuresis Recommend significant weight loss exercise portion control Agree with blood pressure management and control Maintain diabetes management Obstructive sleep apnea sleep study CPAP weight loss Do not recommend any invasive procedures at this  stage Continue physical therapy and increase activity  James Pea, MD 12/15/2022 11:47 AM

## 2022-12-15 NOTE — Progress Notes (Signed)
Progress Note   Patient: James Clarke RUE:[id number] DOB: [birth year] DOA: 12/13/2022     2 DOS: the patient was seen and examined on 12/15/2022   Brief hospital course: 82 y.o. male with medical history significant of HFpEF, CAD, CKD stage IV, type 2 diabetes, hypertension, hyperlipidemia, anemia of chronic kidney disease, BPH, who presents to the ED due to increased shortness of breath and chest pain   Mr. Brunty states that after his hospitalization in November 2023, he had been doing well with his diuretics and volume status.  However 2 weeks ago, he had a rapid worsening in his dyspnea on exertion and shortness of breath at rest.  He has not had any increased urine output since increasing his diuretic regimen as instructed by his nephrologist.  In addition, his weight has stayed stable around 307 pounds.  He endorses chest pain, that particularly occurs when he feels very dyspneic but does not occur without dyspnea.  He denies any palpitations.  He denies any lower extremity swelling that is worse than usual.   Per chart review, patient has had relatively stable CKD stage IV, however worsening hypervolemia that has been difficult to manage.  Patient was seen on 12/09/2022 by nephrology at which time he was started on IV Lasix 80 mg twice a week with oral Lasix on the rest of the days.  He had an injection on 8/21.  He presented today for his second injection.  At the time, RN noted patient's breathing was labored and patient was reporting midsternal chest pain.  He was given one-time dose of sublingual nitro and felt slightly better.  Cardiology was consulted and evaluated patient in shorts today.  Patient was recommended to go to the ED.  5/25.  Patient on Lasix drip.  Still not feeling better with regards to his shortness of breath. 5/26.  Patient feeling a little bit better today.  Continue Lasix drip.  Assessment and Plan: * Acute on chronic diastolic CHF (congestive heart failure)  (HCC) Continue IV Lasix drip.  Patient also on Toprol.  Chronic kidney disease (CKD), stage IV (severe) (HCC) Watch creatinine with diuresis.  Creatinine up to 3.85 with a GFR of 15.  Had fistula placed left wrist previously.  Type 2 diabetes mellitus with renal manifestations (HCC) - SSI,  - Increase Semglee 30 units at bedtime  Hypertension On metoprolol and Norvasc.  Sleep apnea BiPAP at bedtime  Obesity, Class III, BMI 40-49.9 (morbid obesity) (HCC) With current height and weight in computer BMI 44.05.  Asking nursing staff to get a current weight.  Myocardial injury Suspect demand ischemia from heart failure and chronic kidney disease.        Subjective: Patient seen sitting in the bed.  He is feeling better today than yesterday.  Breathing a little bit better.  Urinating well.  Physical Exam: Vitals:   12/15/22 0335 12/15/22 0748 12/15/22 1112 12/15/22 1244  BP: 138/69 (!) 173/78 (!) 147/65 136/63  Pulse: 65 70  80  Resp: 19 18  18   Temp: 97.6 F (36.4 C) 97.6 F (36.4 C)  97.7 F (36.5 C)  SpO2: 96% 94% 96% 98%  Weight:      Height:       Physical Exam HENT:     Head: Normocephalic.     Mouth/Throat:     Pharynx: No oropharyngeal exudate.  Eyes:     General: Lids are normal.     Conjunctiva/sclera: Conjunctivae normal.  Cardiovascular:     Rate  and Rhythm: Normal rate and regular rhythm.     Heart sounds: Normal heart sounds, S1 normal and S2 normal.  Pulmonary:     Breath sounds: Examination of the right-lower field reveals decreased breath sounds and rales. Examination of the left-lower field reveals decreased breath sounds and rales. Decreased breath sounds and rales present. No wheezing or rhonchi.  Abdominal:     General: There is distension.     Palpations: Abdomen is soft.     Tenderness: There is no abdominal tenderness.  Musculoskeletal:     Right lower leg: Swelling present.     Left lower leg: Swelling present.  Skin:    General: Skin  is warm.     Findings: No rash.  Neurological:     Mental Status: He is alert and oriented to person, place, and time.     Data Reviewed: Creatinine 3.85 with a BUN of 80.  Potassium 3.1 Family Communication: Spoke with wife at the bedside  Disposition: Status is: Inpatient Remains inpatient appropriate because: Continue IV Lasix  Planned Discharge Destination: Home with home health    Time spent: 28 minutes  Author: Alford Highland, MD 12/15/2022 1:00 PM  For on call review [email or web address].

## 2022-12-15 NOTE — TOC Progression Note (Signed)
Transition of Care [facility]) - Progression Note    Patient Details  Name: James Clarke MRN: [id number] Date of Birth: 1940/08/22  Transition of [facility] CM/SW Contact  Kemper Durie, RN Phone Number: 12/15/2022, 10:58 AM  Clinical Narrative:     HHPT/OT referral accepted by Alyssa with [facility].   Expected Discharge Plan: Home w Home Health Services Barriers to Discharge: Continued Medical Work up  Expected Discharge Plan and Services     Post Acute Care Choice: Home Health Living arrangements for the past 2 months: Single Family Home                           HH Arranged: PT, OT HH Agency: [facility] Date [facility] Agency Contacted: 12/15/22 Time HH Agency Contacted: 1058 Representative spoke with at [facility] Agency: Alyssa   Social Determinants of Health (SDOH) Interventions SDOH Screenings   Food Insecurity: No Food Insecurity (12/13/2022)  Housing: Patient Unable To Answer (12/13/2022)  Transportation Needs: No Transportation Needs (12/13/2022)  Utilities: Not At Risk (12/13/2022)  Depression (PHQ2-9): Low Risk  (10/30/2021)  Tobacco Use: Low Risk  (11/21/2022)    Readmission Risk Interventions    02/27/2022    4:22 PM  Readmission Risk Prevention Plan  Transportation Screening Complete  HRI or Home Care Consult Complete  Palliative Care Screening Not Applicable  Medication Review (RN Care Manager) Complete

## 2022-12-15 NOTE — Progress Notes (Signed)
Mobility Specialist - Progress Note    12/15/22 1112  Therapy Vitals  BP (!) 147/65  Oxygen Therapy  SpO2 96 %  Mobility  Activity Ambulated with assistance in hallway  Level of Assistance Standby assist, set-up cues, supervision of patient - no hands on  Assistive Device Front wheel walker  Distance Ambulated (ft) 160 ft  Range of Motion/Exercises Active  Activity Response Tolerated well  Mobility Referral Yes  $Mobility charge 1 Mobility  Mobility Specialist Start Time (ACUTE ONLY) 1039  Mobility Specialist Stop Time (ACUTE ONLY) 1111  Mobility Specialist Time Calculation (min) (ACUTE ONLY) 32 min   Pt resting in recliner on RA upon entry. Pt STS and ambulates to hallway around NS for 1 lap SBA with RW. Pt very motivated to participate. Pt returned to recliner to rest for 3-4 minutes (pt was a little winded). Pt ambulates to bed and left with needs in reach and bed alarm activated.   Johnathan Hausen Mobility Specialist 12/15/22, 11:16 AM

## 2022-12-16 DIAGNOSIS — E876 Hypokalemia: Secondary | ICD-10-CM

## 2022-12-16 DIAGNOSIS — I5033 Acute on chronic diastolic (congestive) heart failure: Secondary | ICD-10-CM | POA: Diagnosis not present

## 2022-12-16 DIAGNOSIS — E1122 Type 2 diabetes mellitus with diabetic chronic kidney disease: Secondary | ICD-10-CM | POA: Diagnosis not present

## 2022-12-16 DIAGNOSIS — N184 Chronic kidney disease, stage 4 (severe): Secondary | ICD-10-CM | POA: Diagnosis not present

## 2022-12-16 LAB — BASIC METABOLIC PANEL
Anion gap: 8 (ref 5–15)
BUN: 80 mg/dL — ABNORMAL HIGH (ref 8–23)
CO2: 24 mmol/L (ref 22–32)
Calcium: 8.1 mg/dL — ABNORMAL LOW (ref 8.9–10.3)
Chloride: 106 mmol/L (ref 98–111)
Creatinine, Ser: 3.71 mg/dL — ABNORMAL HIGH (ref 0.61–1.24)
GFR, Estimated: 16 mL/min — ABNORMAL LOW (ref >60)
Glucose, Bld: 220 mg/dL — ABNORMAL HIGH (ref 70–99)
Potassium: 2.9 mmol/L — ABNORMAL LOW (ref 3.5–5.1)
Sodium: 138 mmol/L (ref 135–145)

## 2022-12-16 LAB — MAGNESIUM: Magnesium: 2.3 mg/dL (ref 1.7–2.4)

## 2022-12-16 MED ORDER — POTASSIUM CHLORIDE 10 MEQ/100ML IV SOLN
10.0000 meq | INTRAVENOUS | Status: DC
  Administered 2022-12-16: 10 meq via INTRAVENOUS
  Filled 2022-12-16 (×2): qty 100

## 2022-12-16 MED ORDER — INSULIN ASPART 100 UNIT/ML IJ SOLN
10.0000 [IU] | Freq: Three times a day (TID) | INTRAMUSCULAR | Status: DC
  Administered 2022-12-16 – 2022-12-17 (×5): 10 [IU] via SUBCUTANEOUS
  Filled 2022-12-16 (×5): qty 1

## 2022-12-16 MED ORDER — INSULIN ASPART 100 UNIT/ML IJ SOLN: 6.0000 [IU] | Freq: Three times a day (TID) | INTRAMUSCULAR | Status: DC

## 2022-12-16 MED ORDER — INSULIN GLARGINE-YFGN 100 UNIT/ML ~~LOC~~ SOLN
50.0000 [IU] | Freq: Every day | SUBCUTANEOUS | Status: DC
  Administered 2022-12-16: 50 [IU] via SUBCUTANEOUS
  Filled 2022-12-16 (×2): qty 0.5

## 2022-12-16 MED ORDER — POTASSIUM CHLORIDE CRYS ER 20 MEQ PO TBCR
40.0000 meq | EXTENDED_RELEASE_TABLET | Freq: Three times a day (TID) | ORAL | Status: DC
  Administered 2022-12-16 (×3): 40 meq via ORAL
  Filled 2022-12-16 (×3): qty 2

## 2022-12-16 NOTE — Assessment & Plan Note (Signed)
Replaced aggressively while on Lasix drip.  Potassium normal range.  Can go back on potassium citrate as outpatient.

## 2022-12-16 NOTE — Progress Notes (Signed)
CBG at bedtime resulted to 243 mg/dL. CBG results still not transferring to [facility] charting system.

## 2022-12-16 NOTE — Progress Notes (Signed)
Progress Note   Patient: James Clarke WUJ:[id number] DOB: 1940-09-10 DOA: 12/13/2022     3 DOS: the patient was seen and examined on 12/16/2022   Brief hospital course: 82 y.o. male with medical history significant of HFpEF, CAD, CKD stage IV, type 2 diabetes, hypertension, hyperlipidemia, anemia of chronic kidney disease, BPH, who presents to the ED due to increased shortness of breath and chest pain   Mr. Polyakov states that after his hospitalization in November 2023, he had been doing well with his diuretics and volume status.  However 2 weeks ago, he had a rapid worsening in his dyspnea on exertion and shortness of breath at rest.  He has not had any increased urine output since increasing his diuretic regimen as instructed by his nephrologist.  In addition, his weight has stayed stable around 307 pounds.  He endorses chest pain, that particularly occurs when he feels very dyspneic but does not occur without dyspnea.  He denies any palpitations.  He denies any lower extremity swelling that is worse than usual.   Per chart review, patient has had relatively stable CKD stage IV, however worsening hypervolemia that has been difficult to manage.  Patient was seen on 12/09/2022 by nephrology at which time he was started on IV Lasix 80 mg twice a week with oral Lasix on the rest of the days.  He had an injection on 8/21.  He presented today for his second injection.  At the time, RN noted patient's breathing was labored and patient was reporting midsternal chest pain.  He was given one-time dose of sublingual nitro and felt slightly better.  Cardiology was consulted and evaluated patient in shorts today.  Patient was recommended to go to the ED.  5/25.  Patient on Lasix drip.  Still not feeling better with regards to his shortness of breath. 5/26.  Patient feeling a little bit better today.  Continue Lasix drip.  Assessment and Plan: * Acute on chronic diastolic CHF (congestive heart failure)  (HCC) Continue IV Lasix drip.  Patient also on Toprol.  Weight down from 139.3 kg to 137.3 kg as of yesterday.  Awaiting today's weight.  Hypokalemia Potassium down to 2.9 today.  Unable to tolerate IV potassium.  3 times potassium oral replacement today  Chronic kidney disease (CKD), stage IV (severe) (HCC) Watch creatinine with diuresis.  Creatinine down slightly to 3.71 with a GFR of 15.  Had fistula placed left wrist previously.  Type 2 diabetes mellitus with renal manifestations (HCC) - SSI +10 units of insulin prior to meals - Increase Semglee 50 units at bedtime  Hypertension On metoprolol and Norvasc.  Sleep apnea BiPAP at bedtime  Obesity, Class III, BMI 40-49.9 (morbid obesity) (HCC) With current height and weight in computer BMI 43.43.  Asking nursing staff to get a current weight.  Myocardial injury Suspect demand ischemia from heart failure and chronic kidney disease.        Subjective: Patient feeling a little bit better than yesterday.  Urinating very well.  Potassium low today.  Unable to tolerate IV potassium  Physical Exam: Vitals:   12/16/22 0011 12/16/22 0323 12/16/22 0853 12/16/22 1252  BP: (!) 131/57 (!) 128/52 135/73 127/61  Pulse: 69 63 72 77  Resp: 17 20 20 18   Temp: 98.4 F (36.9 C) 97.9 F (36.6 C) (!) 97.5 F (36.4 C) 98.8 F (37.1 C)  TempSrc: Oral Oral  Oral  SpO2: 97% 95% 99% 98%  Weight:      Height:  Physical Exam HENT:     Head: Normocephalic.     Mouth/Throat:     Pharynx: No oropharyngeal exudate.  Eyes:     General: Lids are normal.     Conjunctiva/sclera: Conjunctivae normal.  Cardiovascular:     Rate and Rhythm: Normal rate and regular rhythm.     Heart sounds: Normal heart sounds, S1 normal and S2 normal.  Pulmonary:     Breath sounds: Examination of the right-lower field reveals decreased breath sounds. Examination of the left-lower field reveals decreased breath sounds. Decreased breath sounds present. No  wheezing, rhonchi or rales.  Abdominal:     General: There is distension.     Palpations: Abdomen is soft.     Tenderness: There is no abdominal tenderness.  Musculoskeletal:     Right lower leg: Swelling present.     Left lower leg: Swelling present.  Skin:    General: Skin is warm.     Findings: No rash.  Neurological:     Mental Status: He is alert and oriented to person, place, and time.     Data Reviewed: Potassium 2.9, creatinine 3.71  Family Communication: Spoke with son at the bedside  Disposition: Status is: Inpatient Remains inpatient appropriate because: Continue IV diuresis today with Lasix drip.  Planned Discharge Destination: Home with home health    Time spent: 28 minutes  Author: Alford Highland, MD 12/16/2022 1:23 PM  For on call review [email or web address].

## 2022-12-16 NOTE — Progress Notes (Signed)
[facility]  ROUNDING NOTE   Subjective:  Patient well-known to [place] as we follow him for diabetes mellitus type 2 with chronic kidney disease, chronic kidney disease stage IV. He has been troubled with volume overload recently. We set him up for outpatient Lasix injection 80 mg IV times twice a week for the next 2 weeks.  When he presented for his second injection he unfortunately became significantly short of breath. He also had chest pain. He was referred over to the emergency department.  Update: Patient resting comfortably in bed.  He did state that he went to walk around the ward yesterday. Last recorded weight was 137.3.   Objective:  Vital signs in last 24 hours:  Temp:  [97.5 F (36.4 C)-99.2 F (37.3 C)] 97.5 F (36.4 C) (05/27 0853) Pulse Rate:  [63-80] 72 (05/27 0853) Resp:  [17-20] 20 (05/27 0853) BP: (128-162)/(52-73) 135/73 (05/27 0853) SpO2:  [94 %-99 %] 99 % (05/27 0853) Weight:  [137.3 kg] 137.3 kg (05/26 1400)  Weight change:  Filed Weights   12/13/22 1320 12/15/22 1400  Weight: (!) 139.3 kg (!) 137.3 kg    Intake/Output: I/O last 3 completed shifts: In: 720 [P.O.:720] Out: 2375 [Urine:2375]   Intake/Output this shift:  Total I/O In: 480 [P.O.:480] Out: 550 [Urine:550]  Physical Exam: General: No acute distress  Head: Normocephalic, atraumatic. Moist oral mucosal membranes  Neck: Supple  Lungs:  Basilar rales, normal effort  Heart: S1S2 no rubs  Abdomen:  Soft, nontender, bowel sounds present  Extremities: Trace peripheral edema.  Neurologic: Awake, alert, following commands  Skin: No acute rash  Access: Left upper extremity AV fistula    Basic Metabolic Panel: Recent Labs  Lab 12/13/22 1025 12/14/22 0427 12/15/22 0430 12/16/22 0417  NA 139 142 140 138  K 3.7 3.5 3.1* 2.9*  CL 103 105 104 106  CO2 26 27 26 24   GLUCOSE 193* 212* 216* 220*  BUN 74* 71* 80* 80*  CREATININE 3.67* 3.56* 3.85* 3.71*  CALCIUM 9.2 8.6* 8.2*  8.1*  MG  --  2.3  --  2.3  PHOS 4.4  --   --   --      Liver Function Tests: Recent Labs  Lab 12/13/22 1025  ALBUMIN 2.8*    No results for input(s): "LIPASE", "AMYLASE" in the last 168 hours. No results for input(s): "AMMONIA" in the last 168 hours.  CBC: Recent Labs  Lab 12/13/22 1400  WBC 14.3*  HGB 9.9*  HCT 32.4*  MCV 92.0  PLT 337     Cardiac Enzymes: No results for input(s): "CKTOTAL", "CKMB", "CKMBINDEX", "TROPONINI" in the last 168 hours.  BNP: Invalid input(s): "POCBNP"  CBG: Recent Labs  Lab 12/13/22 1802 12/13/22 2106  GLUCAP 291* 273*     Microbiology: Results for orders placed or performed during the hospital encounter of 06/12/22  Resp Panel by RT-PCR (Flu A&B, Covid) Anterior Nasal Swab     Status: None   Collection Time: 06/12/22  2:20 PM   Specimen: Anterior Nasal Swab  Result Value Ref Range Status   SARS Coronavirus 2 by RT PCR NEGATIVE NEGATIVE Final    Comment: (NOTE) SARS-CoV-2 target nucleic acids are NOT DETECTED.  The SARS-CoV-2 RNA is generally detectable in upper respiratory specimens during the acute phase of infection. The lowest concentration of SARS-CoV-2 viral copies this assay can detect is 138 copies/mL. A negative result does not preclude SARS-Cov-2 infection and should not be used as the sole basis for  treatment or other patient management decisions. A negative result may occur with  improper specimen collection/handling, submission of specimen other than nasopharyngeal swab, presence of viral mutation(s) within the areas targeted by this assay, and inadequate number of viral copies(<138 copies/mL). A negative result must be combined with clinical observations, patient history, and epidemiological information. The expected result is Negative.  Fact Sheet for Patients:  BloggerCourse.com  Fact Sheet for Healthcare Providers:  SeriousBroker.it  This test is no t  yet approved or cleared by the [place] FDA and  has been authorized for detection and/or diagnosis of SARS-CoV-2 by FDA under an Emergency Use Authorization (EUA). This EUA will remain  in effect (meaning this test can be used) for the duration of the COVID-19 declaration under Section 564(b)(1) of the Act, 21 U.S.C.section 360bbb-3(b)(1), unless the authorization is terminated  or revoked sooner.       Influenza A by PCR NEGATIVE NEGATIVE Final   Influenza B by PCR NEGATIVE NEGATIVE Final    Comment: (NOTE) The Xpert Xpress SARS-CoV-2/FLU/RSV plus assay is intended as an aid in the diagnosis of influenza from Nasopharyngeal swab specimens and should not be used as a sole basis for treatment. Nasal washings and aspirates are unacceptable for Xpert Xpress SARS-CoV-2/FLU/RSV testing.  Fact Sheet for Patients: BloggerCourse.com  Fact Sheet for Healthcare Providers: SeriousBroker.it  This test is not yet approved or cleared by the [place] FDA and has been authorized for detection and/or diagnosis of SARS-CoV-2 by FDA under an Emergency Use Authorization (EUA). This EUA will remain in effect (meaning this test can be used) for the duration of the COVID-19 declaration under Section 564(b)(1) of the Act, 21 U.S.C. section 360bbb-3(b)(1), unless the authorization is terminated or revoked.  Performed at [facility], [address] [place], [place] [address]     Coagulation Studies: No results for input(s): "LABPROT", "INR" in the last 72 hours.  Urinalysis: No results for input(s): "COLORURINE", "LABSPEC", "PHURINE", "GLUCOSEU", "HGBUR", "BILIRUBINUR", "KETONESUR", "PROTEINUR", "UROBILINOGEN", "NITRITE", "LEUKOCYTESUR" in the last 72 hours.  Invalid input(s): "APPERANCEUR"    Imaging: No results found.   Medications:    sodium chloride     furosemide (LASIX) 200 mg in dextrose 5 % 100 mL (2 mg/mL)  infusion 6 mg/hr (12/16/22 0621)    amLODipine  5 mg Oral Daily   aspirin EC  81 mg Oral Daily   calcitRIOL  0.25 mcg Oral Daily   heparin  5,000 Units Subcutaneous Q8H   insulin aspart  0-15 Units Subcutaneous TID WC   insulin aspart  0-5 Units Subcutaneous QHS   insulin aspart  10 Units Subcutaneous TID WC   insulin glargine-yfgn  50 Units Subcutaneous QHS   isosorbide mononitrate  30 mg Oral Daily   metoprolol succinate  50 mg Oral Daily   potassium chloride  40 mEq Oral TID   pravastatin  40 mg Oral q1800   sodium chloride flush  3 mL Intravenous Q12H   tamsulosin  0.4 mg Oral QPC breakfast   sodium chloride, acetaminophen, albuterol, nitroGLYCERIN, ondansetron (ZOFRAN) IV, sodium chloride flush  Assessment/ Plan:  82 y.o. male with past medical history of long-standing diabetes mellitus type 2, hypertension, hyperlipidemia, allergic rhinitis, history of recurrent Bell's palsy, low testosterone levels, nephrolithiasis with right UPJ obstruction status post laser lithotripsy, left UVJ obstruction status post ureteroscopy with stone removal and stent placement, left upper extremity AV fistula 09/19/2022, and obstructive sleep apnea who was admitted with chest pain and acute on chronic  diastolic heart failure.  1.  Diabetes mellitus type 2 with chronic kidney disease/chronic kidney disease stage IV/proteinuria.  Clinically continues to improve.  Continue Lasix drip 6 mg/h for another day.  We will reassess for additional need of Lasix drip tomorrow.  2.  Acute on chronic diastolic heart failure.  As above he continues to improve.  Continue Lasix drip at 6 mg/h.  3.  Hypertension.  Continue amlodipine, metoprolol, and Imdur.  4.  Anemia of chronic kidney disease.  Lab Results  Component Value Date   HGB 9.9 (L) 12/13/2022   Hemoglobin 9.9.  Hold off on Retacrit for now.     LOS: 3 [place] 5/27/202411:55 AM

## 2022-12-16 NOTE — Care Management Important Message (Signed)
Important Message  Patient Details  Name: James Clarke MRN: [id number] Date of Birth: 09/28/1940   Medicare Important Message Given:  Yes     Johnell Comings 12/16/2022, 1:04 PM

## 2022-12-16 NOTE — Progress Notes (Signed)
[facility] Cardiology    SUBJECTIVE: Patient states to be doing reasonably well today complaining of arm discomfort from potassium infusion lying in bed resting comfortably son at bedside no chest pain shortness of breath improved has not been ambulating as often without physical therapy input of advised to have family and nursing staff ambulate the patient and have him out of bed to chair as often as possible   Vitals:   12/15/22 1942 12/16/22 0011 12/16/22 0323 12/16/22 0853  BP: (!) 141/57 (!) 131/57 (!) 128/52 135/73  Pulse: 74 69 63 72  Resp: 20 17 20 20   Temp: 99.2 F (37.3 C) 98.4 F (36.9 C) 97.9 F (36.6 C) (!) 97.5 F (36.4 C)  TempSrc: Oral Oral Oral   SpO2: 94% 97% 95% 99%  Weight:      Height:         Intake/Output Summary (Last 24 hours) at 12/16/2022 1005 Last data filed at 12/16/2022 1191 Gross per 24 hour  Intake 960 ml  Output 1200 ml  Net -240 ml      PHYSICAL EXAM  General: Well developed, well nourished, in no acute distress HEENT:  Normocephalic and atramatic Neck:  No JVD.  Lungs: Clear bilaterally to auscultation and percussion. Heart: HRRR . Normal S1 and S2 without gallops or murmurs.  Abdomen: Bowel sounds are positive, abdomen soft and non-tender  Msk:  Back normal, normal gait. Normal strength and tone for age. Extremities: No clubbing, cyanosis or edema.   Neuro: Alert and oriented X 3. Psych:  Good affect, responds appropriately   LABS: Basic Metabolic Panel: Recent Labs    12/13/22 1025 12/14/22 0427 12/15/22 0430 12/16/22 0417  NA 139 142 140 138  K 3.7 3.5 3.1* 2.9*  CL 103 105 104 106  CO2 26 27 26 24   GLUCOSE 193* 212* 216* 220*  BUN 74* 71* 80* 80*  CREATININE 3.67* 3.56* 3.85* 3.71*  CALCIUM 9.2 8.6* 8.2* 8.1*  MG  --  2.3  --  2.3  PHOS 4.4  --   --   --    Liver Function Tests: Recent Labs    12/13/22 1025  ALBUMIN 2.8*   No results for input(s): "LIPASE", "AMYLASE" in the last 72 hours. CBC: Recent Labs     12/13/22 1400  WBC 14.3*  HGB 9.9*  HCT 32.4*  MCV 92.0  PLT 337   Cardiac Enzymes: No results for input(s): "CKTOTAL", "CKMB", "CKMBINDEX", "TROPONINI" in the last 72 hours. BNP: Invalid input(s): "POCBNP" D-Dimer: No results for input(s): "DDIMER" in the last 72 hours. Hemoglobin A1C: No results for input(s): "HGBA1C" in the last 72 hours. Fasting Lipid Panel: No results for input(s): "CHOL", "HDL", "LDLCALC", "TRIG", "CHOLHDL", "LDLDIRECT" in the last 72 hours. Thyroid Function Tests: No results for input(s): "TSH", "T4TOTAL", "T3FREE", "THYROIDAB" in the last 72 hours.  Invalid input(s): "FREET3" Anemia Panel: No results for input(s): "VITAMINB12", "FOLATE", "FERRITIN", "TIBC", "IRON", "RETICCTPCT" in the last 72 hours.  No results found.   Echo of left ventricular function EF of 60%  TELEMETRY: Normal sinus rhythm rate of 65 nonspecific ST-T wave changes:  ASSESSMENT AND PLAN:  Principal Problem:   Acute on chronic diastolic CHF (congestive heart failure) (HCC) Active Problems:   Type 2 diabetes mellitus with renal manifestations (HCC)   Sleep apnea   Obesity, Class III, BMI 40-49.9 (morbid obesity) (HCC)   Hypertension   Chronic kidney disease (CKD), stage IV (severe) (HCC)   Goals of care, counseling/discussion   Myocardial  injury Hypokalemia  Plan Congestive heart failure diastolic dysfunction volume overload continue Lasix drip Chronic renal insufficiency stage IV on Lasix drip appreciate nephrology input and management Diabetes continue current management Obstructive sleep apnea recommend CPAP weight loss follow-up with pulmonary Obesity recommend weight loss exercise portion control Hypertension reasonably stable continue current management blood pressure control assess Increase activity have the patient ambulate up out of bed to chair Continues with conservative cardiac management including Continue to correct electrolytes patient unable to tolerate  IV potassium infusion will switch to p.o. Recommend evaluate for magnesium level as well and replace accordingly Do not recommend any invasive procedures at this stage   [name], MD 12/16/2022 10:05 AM

## 2022-12-17 ENCOUNTER — Inpatient Hospital Stay
Admit: 2022-12-17 | Discharge: 2022-12-17 | Disposition: A | Discharge status: Home / Self Care | Payer: PPO | Attending: Cardiology | Admitting: Cardiology

## 2022-12-17 DIAGNOSIS — E876 Hypokalemia: Secondary | ICD-10-CM | POA: Diagnosis not present

## 2022-12-17 DIAGNOSIS — I5033 Acute on chronic diastolic (congestive) heart failure: Secondary | ICD-10-CM | POA: Diagnosis not present

## 2022-12-17 DIAGNOSIS — E1122 Type 2 diabetes mellitus with diabetic chronic kidney disease: Secondary | ICD-10-CM | POA: Diagnosis not present

## 2022-12-17 DIAGNOSIS — N184 Chronic kidney disease, stage 4 (severe): Secondary | ICD-10-CM | POA: Diagnosis not present

## 2022-12-17 LAB — GLUCOSE, CAPILLARY
Glucose-Capillary: 336 mg/dL — ABNORMAL HIGH (ref 70–99)
Glucose-Capillary: 340 mg/dL — ABNORMAL HIGH (ref 70–99)
Glucose-Capillary: 281 mg/dL — ABNORMAL HIGH (ref 70–99)
Glucose-Capillary: 260 mg/dL — ABNORMAL HIGH (ref 70–99)
Glucose-Capillary: 206 mg/dL — ABNORMAL HIGH (ref 70–99)
Glucose-Capillary: 336 mg/dL — ABNORMAL HIGH (ref 70–99)
Glucose-Capillary: 313 mg/dL — ABNORMAL HIGH (ref 70–99)
Glucose-Capillary: 232 mg/dL — ABNORMAL HIGH (ref 70–99)
Glucose-Capillary: 207 mg/dL — ABNORMAL HIGH (ref 70–99)
Glucose-Capillary: 314 mg/dL — ABNORMAL HIGH (ref 70–99)
Glucose-Capillary: 263 mg/dL — ABNORMAL HIGH (ref 70–99)
Glucose-Capillary: 243 mg/dL — ABNORMAL HIGH (ref 70–99)
Glucose-Capillary: 119 mg/dL — ABNORMAL HIGH (ref 70–99)
Glucose-Capillary: 140 mg/dL — ABNORMAL HIGH (ref 70–99)
Glucose-Capillary: 226 mg/dL — ABNORMAL HIGH (ref 70–99)
Glucose-Capillary: 295 mg/dL — ABNORMAL HIGH (ref 70–99)

## 2022-12-17 LAB — CBC
HCT: 32.2 % — ABNORMAL LOW (ref 39.0–52.0)
Hemoglobin: 9.9 g/dL — ABNORMAL LOW (ref 13.0–17.0)
MCH: 28.1 pg (ref 26.0–34.0)
MCHC: 30.7 g/dL (ref 30.0–36.0)
MCV: 91.5 fL (ref 80.0–100.0)
Platelets: 377 10*3/uL (ref 150–400)
RBC: 3.52 MIL/uL — ABNORMAL LOW (ref 4.22–5.81)
RDW: 14.2 % (ref 11.5–15.5)
WBC: 12.3 10*3/uL — ABNORMAL HIGH (ref 4.0–10.5)
nRBC: 0 % (ref 0.0–0.2)

## 2022-12-17 LAB — BASIC METABOLIC PANEL
Anion gap: 10 (ref 5–15)
BUN: 80 mg/dL — ABNORMAL HIGH (ref 8–23)
CO2: 25 mmol/L (ref 22–32)
Calcium: 8.5 mg/dL — ABNORMAL LOW (ref 8.9–10.3)
Chloride: 108 mmol/L (ref 98–111)
Creatinine, Ser: 3.57 mg/dL — ABNORMAL HIGH (ref 0.61–1.24)
GFR, Estimated: 16 mL/min — ABNORMAL LOW (ref >60)
Glucose, Bld: 207 mg/dL — ABNORMAL HIGH (ref 70–99)
Potassium: 4.5 mmol/L (ref 3.5–5.1)
Sodium: 143 mmol/L (ref 135–145)

## 2022-12-17 LAB — ECHOCARDIOGRAM COMPLETE: Weight: 4782.4 oz

## 2022-12-17 MED ORDER — INSULIN ASPART 100 UNIT/ML IJ SOLN: 14.0000 [IU] | Freq: Three times a day (TID) | INTRAMUSCULAR | 11 refills | Status: DC

## 2022-12-17 MED ORDER — POTASSIUM CHLORIDE CRYS ER 20 MEQ PO TBCR
40.0000 meq | EXTENDED_RELEASE_TABLET | Freq: Two times a day (BID) | ORAL | Status: DC
  Administered 2022-12-17: 40 meq via ORAL
  Filled 2022-12-17: qty 2

## 2022-12-17 MED ORDER — FUROSEMIDE 40 MG PO TABS: 40.0000 mg | ORAL_TABLET | Freq: Every day | ORAL | Status: DC

## 2022-12-17 MED ORDER — AMLODIPINE BESYLATE 5 MG PO TABS: 5.0000 mg | ORAL_TABLET | Freq: Every day | ORAL | 0 refills | Status: DC

## 2022-12-17 MED ORDER — FUROSEMIDE 40 MG PO TABS: 80.0000 mg | ORAL_TABLET | Freq: Every day | ORAL | Status: DC

## 2022-12-17 MED ORDER — POTASSIUM CHLORIDE CRYS ER 20 MEQ PO TBCR: 40.0000 meq | EXTENDED_RELEASE_TABLET | Freq: Every day | ORAL | Status: DC

## 2022-12-17 MED ORDER — POTASSIUM CHLORIDE CRYS ER 20 MEQ PO TBCR: 20.0000 meq | EXTENDED_RELEASE_TABLET | Freq: Every day | ORAL | Status: DC

## 2022-12-17 NOTE — Discharge Summary (Signed)
Physician Discharge Summary   Patient: James Clarke MRN: [id number] DOB: 13-Apr-1941  Admit date:     12/13/2022  Discharge date: 12/17/22  Discharge Physician: Alford Highland   PCP: Marina Goodell, MD   Recommendations at discharge:   Follow-up PCP 5 days Keep appointment with vascular surgery team Follow-up CHF clinic Nephrology will call you to schedule an appointment next week Follow-up cardiology  Discharge Diagnoses: Principal Problem:   Acute on chronic diastolic CHF (congestive heart failure) (HCC) Active Problems:   Hypokalemia   Chronic kidney disease (CKD), stage IV (severe) (HCC)   Type 2 diabetes mellitus with renal manifestations (HCC)   Hypertension   Sleep apnea   Obesity, Class III, BMI 40-49.9 (morbid obesity) (HCC)   Goals of care, counseling/discussion   Myocardial injury    Hospital Course: 82 y.o. male with medical history significant of HFpEF, CAD, CKD stage IV, type 2 diabetes, hypertension, hyperlipidemia, anemia of chronic kidney disease, BPH, who presents to the ED due to increased shortness of breath and chest pain   James Clarke states that after his hospitalization in November 2023, he had been doing well with his diuretics and volume status.  However 2 weeks ago, he had a rapid worsening in his dyspnea on exertion and shortness of breath at rest.  He has not had any increased urine output since increasing his diuretic regimen as instructed by his nephrologist.  In addition, his weight has stayed stable around 307 pounds.  He endorses chest pain, that particularly occurs when he feels very dyspneic but does not occur without dyspnea.  He denies any palpitations.  He denies any lower extremity swelling that is worse than usual.   Per chart review, patient has had relatively stable CKD stage IV, however worsening hypervolemia that has been difficult to manage.  Patient was seen on 12/09/2022 by nephrology at which time he was started on IV Lasix 80  mg twice a week with oral Lasix on the rest of the days.  He had an injection on 8/21.  He presented today for his second injection.  At the time, RN noted patient's breathing was labored and patient was reporting midsternal chest pain.  He was given one-time dose of sublingual nitro and felt slightly better.  Cardiology was consulted and evaluated patient in shorts today.  Patient was recommended to go to the ED.  5/25.  Patient on Lasix drip.  Still not feeling better with regards to his shortness of breath. 5/26.  Patient feeling a little bit better today.  Continue Lasix drip. 5/27.  Continue Lasix drip. 5/28.  Patient feeling better and wanting to go home.  Patient's weight upon discharge 298.9 pounds or 135.6 kg.  I advised him to weigh on a scale at home and keep track from those weights.  If you gain 5 pounds in a week or 3 pounds in a day call the CHF clinic.  Nephrology wanted you back on Lasix 80 mg in the morning and 40 mg in the evening.  Assessment and Plan: * Acute on chronic diastolic CHF (congestive heart failure) (HCC) As per nephrology okay to come off Lasix drip and go back on Lasix 80 mg in the morning and 40 mg in the evening.  Patient feeling well and wanted to go home.  Patient also on Toprol.  Weight down from 139.3 kg to 135.6 kg or 298.9 pounds.  Fluid restriction.  Follow-up at the CHF clinic.  Hypokalemia Replaced aggressively while on Lasix  drip.  Potassium normal range.  Can go back on potassium citrate as outpatient.  Chronic kidney disease (CKD), stage IV (severe) (HCC) Watch creatinine with diuresis.  Creatinine down slightly to 3.57 with a GFR of 16.  Had fistula placed left wrist previously and can follow-up with vascular surgery as outpatient.  Type 2 diabetes mellitus with renal manifestations (HCC) Continue long-acting insulin 50 units at night and can go up to 14 units prior to meals  Hypertension On metoprolol and Norvasc.  Sleep apnea BiPAP at  bedtime  Obesity, Class III, BMI 40-49.9 (morbid obesity) (HCC) With current height and weight in computer BMI 42.89.    Myocardial injury Suspect demand ischemia from heart failure and chronic kidney disease.         Consultants: Cardiology, nephrology Procedures performed: None Disposition: Home with home health Diet recommendation:  Cardiac and Carb modified diet, 1500 mL fluid restriction. DISCHARGE MEDICATION: Allergies as of 12/17/2022       Reactions   Codeine Nausea And Vomiting   Doxycycline    Erythromycin Rash        Medication List     STOP taking these medications    hydrALAZINE 50 MG tablet Commonly known as: APRESOLINE   traMADol 50 MG tablet Commonly known as: ULTRAM       TAKE these medications    acetaminophen 500 MG tablet Commonly known as: TYLENOL Take 1,000 mg by mouth every 6 (six) hours as needed (shoulder pain).   albuterol 108 (90 Base) MCG/ACT inhaler Commonly known as: VENTOLIN HFA Inhale 2 puffs into the lungs every 6 (six) hours as needed for wheezing or shortness of breath.   amLODipine 5 MG tablet Commonly known as: NORVASC Take 1 tablet (5 mg total) by mouth daily. What changed: Another medication with the same name was removed. Continue taking this medication, and follow the directions you see here.   aspirin EC 81 MG tablet Take by mouth.   calcitRIOL 0.25 MCG capsule Commonly known as: ROCALTROL Take 0.25 mcg by mouth daily.   cetirizine 10 MG chewable tablet Commonly known as: ZYRTEC Chew 10 mg by mouth daily as needed for allergies.   fluticasone 50 MCG/ACT nasal spray Commonly known as: FLONASE as needed.   FreeStyle Libre 14 Day Sensor Misc by Does not apply route.   furosemide 40 MG tablet Commonly known as: LASIX Take 80 mg q am and 40 mg qpm   Global Inject Ease Insulin Syr 31G X 5/16" 0.5 ML Misc Generic drug: Insulin Syringe-Needle U-100 Inject into the skin.   insulin aspart 100 UNIT/ML  injection Commonly known as: novoLOG Inject 14 Units into the skin 3 (three) times daily with meals. What changed:  how much to take additional instructions   insulin detemir 100 UNIT/ML injection Commonly known as: LEVEMIR Inject 0.5 mLs (50 Units total) into the skin at bedtime.   isosorbide mononitrate 30 MG 24 hr tablet Commonly known as: IMDUR Take 1 tablet (30 mg total) by mouth daily.   lovastatin 40 MG tablet Commonly known as: MEVACOR Take 40 mg by mouth daily with supper.   metoprolol succinate 50 MG 24 hr tablet Commonly known as: TOPROL-XL Take 50 mg by mouth daily.   nitroGLYCERIN 0.4 MG SL tablet Commonly known as: NITROSTAT Place 1 tablet (0.4 mg total) under the tongue every 5 (five) minutes as needed for chest pain.   OneTouch Delica Plus Lancet30G Misc USE 1 LANCET 4 TIMES DAILY   OneTouch Ultra test  strip Generic drug: glucose blood TEST BLOOD SUGAR 4 TIMES DAILY   potassium citrate 10 MEQ (1080 MG) SR tablet Commonly known as: UROCIT-K TAKE (1) TABLET BY MOUTH TWICE DAILY WITH A MEAL.   tamsulosin 0.4 MG Caps capsule Commonly known as: FLOMAX TAKE (1) CAPSULE BY MOUTH EVERY DAY        Follow-up Information     Marina Goodell, MD Follow up in 5 day(s).   Specialty: Family Medicine Contact information: [address] [facility] [place] [address] [phone]         [facility] Follow up.   Why: They will follow up with you for your home health needs.        Callwood, Dwayne D, MD. Go in 1 week(s).   Specialties: Cardiology, Internal Medicine Why: Appointment on Wednesday, 12/25/2022 at 11:15am. Contact information: [address] [place] [address] [phone]                Discharge Exam: Ceasar Mons Weights   12/15/22 1400 12/16/22 1400 12/17/22 1100  Weight: (!) 137.3 kg (!) 137.3 kg 135.6 kg   Physical Exam HENT:     Head: Normocephalic.     Mouth/Throat:      Pharynx: No oropharyngeal exudate.  Eyes:     General: Lids are normal.     Conjunctiva/sclera: Conjunctivae normal.  Cardiovascular:     Rate and Rhythm: Normal rate and regular rhythm.     Heart sounds: Normal heart sounds, S1 normal and S2 normal.  Pulmonary:     Breath sounds: Examination of the right-lower field reveals decreased breath sounds. Examination of the left-lower field reveals decreased breath sounds. Decreased breath sounds present. No wheezing, rhonchi or rales.  Abdominal:     General: There is distension.     Palpations: Abdomen is soft.     Tenderness: There is no abdominal tenderness.  Musculoskeletal:     Right lower leg: Swelling present.     Left lower leg: Swelling present.  Skin:    General: Skin is warm.     Findings: No rash.  Neurological:     Mental Status: He is alert and oriented to person, place, and time.      Condition at discharge: stable  The results of significant diagnostics from this hospitalization (including imaging, microbiology, ancillary and laboratory) are listed below for reference.   Imaging Studies: US Venous Img Lower Bilateral  Result Date: 12/13/2022 CLINICAL DATA:  Dyspnea EXAM: BILATERAL LOWER EXTREMITY VENOUS DOPPLER ULTRASOUND TECHNIQUE: Gray-scale sonography with graded compression, as well as color Doppler and duplex ultrasound were performed to evaluate the lower extremity deep venous systems from the level of the common femoral vein and including the common femoral, femoral, profunda femoral, popliteal and calf veins including the posterior tibial, peroneal and gastrocnemius veins when visible. The superficial great saphenous vein was also interrogated. Spectral Doppler was utilized to evaluate flow at rest and with distal augmentation maneuvers in the common femoral, femoral and popliteal veins. COMPARISON:  None Available. FINDINGS: RIGHT LOWER EXTREMITY Common Femoral Vein: No evidence of thrombus. Normal compressibility,  respiratory phasicity and response to augmentation. Saphenofemoral Junction: No evidence of thrombus. Normal compressibility and flow on color Doppler imaging. Profunda Femoral Vein: No evidence of thrombus. Normal compressibility and flow on color Doppler imaging. Femoral Vein: No evidence of thrombus. Normal compressibility, respiratory phasicity and response to augmentation. Popliteal Vein: No evidence of thrombus. Normal compressibility, respiratory phasicity and  response to augmentation. Calf Veins: No evidence of thrombus. Normal compressibility and flow on color Doppler imaging. Superficial Great Saphenous Vein: No evidence of thrombus. Normal compressibility. Venous Reflux:  None. Other Findings:  None. LEFT LOWER EXTREMITY Common Femoral Vein: No evidence of thrombus. Normal compressibility, respiratory phasicity and response to augmentation. Saphenofemoral Junction: No evidence of thrombus. Normal compressibility and flow on color Doppler imaging. Profunda Femoral Vein: No evidence of thrombus. Normal compressibility and flow on color Doppler imaging. Femoral Vein: No evidence of thrombus. Normal compressibility, respiratory phasicity and response to augmentation. Popliteal Vein: No evidence of thrombus. Normal compressibility, respiratory phasicity and response to augmentation. Calf Veins: No evidence of thrombus. Normal compressibility and flow on color Doppler imaging. Superficial Great Saphenous Vein: No evidence of thrombus. Normal compressibility. Venous Reflux:  None. Other Findings:  None. IMPRESSION: No evidence of deep venous thrombosis in either lower extremity. Electronically Signed   By: Malachy Moan M.D.   On: 12/13/2022 14:57   X-ray chest PA or AP  Result Date: 12/13/2022 CLINICAL DATA:  Digestive heart failure. EXAM: CHEST  1 VIEW COMPARISON:  June 14, 2022. FINDINGS: Stable cardiomediastinal silhouette. Bilateral diffuse reticular opacities are noted, but acute superimposed  edema or atypical inflammation can not be excluded. Bony thorax is unremarkable. IMPRESSION: Bilateral diffuse reticular opacities are again noted as described above. Electronically Signed   By: Lupita Raider M.D.   On: 12/13/2022 11:26   VAS US DUPLEX DIALYSIS ACCESS (AVF, AVG)  Result Date: 11/21/2022 DIALYSIS ACCESS Patient Name:  Jailyn Ishii  Date of Exam:   11/21/2022 Medical Rec #: [id number]        Accession #:    [id number] Date of Birth: [birth year]       Patient Gender: M Patient Age:   82 years Exam Location:  [facility] Procedure:      VAS US DUPLEX DIALYSIS ACCESS (AVF, AVG) Referring Phys: Sheppard Plumber --------------------------------------------------------------------------------  Reason for Exam: Routine follow up. Access Site: Left Upper Extremity. Access Type: Radial-cephalic AVF. History: Patient complains of left hand numbness. Performing Technologist: Hardie Lora RVT  Examination Guidelines: A complete evaluation includes B-mode imaging, spectral Doppler, color Doppler, and power Doppler as needed of all accessible portions of each vessel. Unilateral testing is considered an integral part of a complete examination. Limited examinations for reoccurring indications may be performed as noted.  Findings: +--------------------+----------+-----------------+--------+ AVF                 PSV (cm/s)Flow Vol (mL/min)Comments +--------------------+----------+-----------------+--------+ Native artery inflow   144          1329                +--------------------+----------+-----------------+--------+ AVF Anastomosis        665                              +--------------------+----------+-----------------+--------+  +------------+----------+-------------+----------+--------+ OUTFLOW VEINPSV (cm/s)Diameter (cm)Depth (cm)Describe +------------+----------+-------------+----------+--------+ Confluence     198        0.67                         +------------+----------+-------------+----------+--------+ Clavicle       125        0.56                        +------------+----------+-------------+----------+--------+ Shoulder  136        0.56                        +------------+----------+-------------+----------+--------+ Prox UA        133        0.59                        +------------+----------+-------------+----------+--------+ Mid UA         111        0.66                        +------------+----------+-------------+----------+--------+ Dist UA        108        0.72                        +------------+----------+-------------+----------+--------+ AC Fossa       125        0.66                        +------------+----------+-------------+----------+--------+ Prox Forearm   155        0.55                        +------------+----------+-------------+----------+--------+ Mid Forearm    183        0.51                        +------------+----------+-------------+----------+--------+ Dist Forearm   285        0.39                        +------------+----------+-------------+----------+--------+ Right second digit pressure 179 mm Hg. Left second digit pressure 118 mm Hg, 193 mm Hg with manual compression of AVF   Summary: Patent arteriovenous fistula.  Left second digit pressure increased 118 mm Hg to 193 mm Hg with manual compression of AVF.  *See table(s) above for measurements and observations.  Diagnosing physician: Levora Dredge MD Electronically signed by Levora Dredge MD on 11/21/2022 at 3:23:57 PM.   --------------------------------------------------------------------------------   Final     Microbiology: Results for orders placed or performed during the hospital encounter of 06/12/22  Resp Panel by RT-PCR (Flu A&B, Covid) Anterior Nasal Swab     Status: None   Collection Time: 06/12/22  2:20 PM   Specimen: Anterior Nasal Swab  Result Value Ref Range Status   SARS  Coronavirus 2 by RT PCR NEGATIVE NEGATIVE Final    Comment: (NOTE) SARS-CoV-2 target nucleic acids are NOT DETECTED.  The SARS-CoV-2 RNA is generally detectable in upper respiratory specimens during the acute phase of infection. The lowest concentration of SARS-CoV-2 viral copies this assay can detect is 138 copies/mL. A negative result does not preclude SARS-Cov-2 infection and should not be used as the sole basis for treatment or other patient management decisions. A negative result may occur with  improper specimen collection/handling, submission of specimen other than nasopharyngeal swab, presence of viral mutation(s) within the areas targeted by this assay, and inadequate number of viral copies(<138 copies/mL). A negative result must be combined with clinical observations, patient history, and epidemiological information. The expected result is Negative.  Fact Sheet for Patients:  BloggerCourse.com  Fact Sheet for Healthcare Providers:  SeriousBroker.it  This test is no t yet approved  or cleared by the [place] and  has been authorized for detection and/or diagnosis of SARS-CoV-2 by FDA under an Emergency Use Authorization (EUA). This EUA will remain  in effect (meaning this test can be used) for the duration of the COVID-19 declaration under Section 564(b)(1) of the Act, 21 U.S.C.section 360bbb-3(b)(1), unless the authorization is terminated  or revoked sooner.       Influenza A by PCR NEGATIVE NEGATIVE Final   Influenza B by PCR NEGATIVE NEGATIVE Final    Comment: (NOTE) The Xpert Xpress SARS-CoV-2/FLU/RSV plus assay is intended as an aid in the diagnosis of influenza from Nasopharyngeal swab specimens and should not be used as a sole basis for treatment. Nasal washings and aspirates are unacceptable for Xpert Xpress SARS-CoV-2/FLU/RSV testing.  Fact Sheet for  Patients: BloggerCourse.com  Fact Sheet for Healthcare Providers: SeriousBroker.it  This test is not yet approved or cleared by the [place] FDA and has been authorized for detection and/or diagnosis of SARS-CoV-2 by FDA under an Emergency Use Authorization (EUA). This EUA will remain in effect (meaning this test can be used) for the duration of the COVID-19 declaration under Section 564(b)(1) of the Act, 21 U.S.C. section 360bbb-3(b)(1), unless the authorization is terminated or revoked.  Performed at [facility], [address] [place], [place] [address]     Labs: CBC: Recent Labs  Lab 12/13/22 1400 12/17/22 0422  WBC 14.3* 12.3*  HGB 9.9* 9.9*  HCT 32.4* 32.2*  MCV 92.0 91.5  PLT 337 377   Basic Metabolic Panel: Recent Labs  Lab 12/13/22 1025 12/14/22 0427 12/15/22 0430 12/16/22 0417 12/17/22 0422  NA 139 142 140 138 143  K 3.7 3.5 3.1* 2.9* 4.5  CL 103 105 104 106 108  CO2 26 27 26 24 25   GLUCOSE 193* 212* 216* 220* 207*  BUN 74* 71* 80* 80* 80*  CREATININE 3.67* 3.56* 3.85* 3.71* 3.57*  CALCIUM 9.2 8.6* 8.2* 8.1* 8.5*  MG  --  2.3  --  2.3  --   PHOS 4.4  --   --   --   --    Liver Function Tests: Recent Labs  Lab 12/13/22 1025  ALBUMIN 2.8*   CBG: Recent Labs  Lab 12/16/22 2006 12/16/22 2255 12/17/22 0137 12/17/22 0810 12/17/22 1312  GLUCAP 243* 119* 140* 226* 295*    Discharge time spent: greater than 30 minutes.  Signed: Alford Highland, MD [facility] 12/17/2022

## 2022-12-17 NOTE — Progress Notes (Signed)
[facility]  ROUNDING NOTE   Subjective:  Patient well-known to [place] as we follow him for diabetes mellitus type 2 with chronic kidney disease, chronic kidney disease stage IV. He has been troubled with volume overload recently. We set him up for outpatient Lasix injection 80 mg IV times twice a week for the next 2 weeks.  When he presented for his second injection he unfortunately became significantly short of breath. He also had chest pain. He was referred over to the emergency department.  Update: Overall patient feeling much better. No lower extremity edema now. Weight also down to 135.6.   Objective:  Vital signs in last 24 hours:  Temp:  [97.6 F (36.4 C)-98.4 F (36.9 C)] 98.4 F (36.9 C) (05/28 0800) Pulse Rate:  [63-73] 69 (05/28 0800) Resp:  [16-20] 18 (05/28 1400) BP: (122-151)/(52-76) 151/76 (05/28 0800) SpO2:  [96 %-98 %] 98 % (05/28 0800) FiO2 (%):  [21 %] 21 % (05/27 2100) Weight:  [135.6 kg] 135.6 kg (05/28 1100)  Weight change: -0.045 kg Filed Weights   12/15/22 1400 12/16/22 1400 12/17/22 1100  Weight: (!) 137.3 kg (!) 137.3 kg 135.6 kg    Intake/Output: I/O last 3 completed shifts: In: 960 [P.O.:960] Out: 2950 [Urine:2950]   Intake/Output this shift:  Total I/O In: 927.2 [P.O.:720; I.V.:207.2] Out: 700 [Urine:700]  Physical Exam: General: No acute distress  Head: Normocephalic, atraumatic. Moist oral mucosal membranes  Neck: Supple  Lungs:  Basilar rales, normal effort  Heart: S1S2 no rubs  Abdomen:  Soft, nontender, bowel sounds present  Extremities: No peripheral edema.  Neurologic: Awake, alert, following commands  Skin: No acute rash  Access: Left upper extremity AV fistula    Basic Metabolic Panel: Recent Labs  Lab 12/13/22 1025 12/14/22 0427 12/15/22 0430 12/16/22 0417 12/17/22 0422  NA 139 142 140 138 143  K 3.7 3.5 3.1* 2.9* 4.5  CL 103 105 104 106 108  CO2 26 27 26 24 25   GLUCOSE 193* 212* 216* 220* 207*   BUN 74* 71* 80* 80* 80*  CREATININE 3.67* 3.56* 3.85* 3.71* 3.57*  CALCIUM 9.2 8.6* 8.2* 8.1* 8.5*  MG  --  2.3  --  2.3  --   PHOS 4.4  --   --   --   --      Liver Function Tests: Recent Labs  Lab 12/13/22 1025  ALBUMIN 2.8*    No results for input(s): "LIPASE", "AMYLASE" in the last 168 hours. No results for input(s): "AMMONIA" in the last 168 hours.  CBC: Recent Labs  Lab 12/13/22 1400 12/17/22 0422  WBC 14.3* 12.3*  HGB 9.9* 9.9*  HCT 32.4* 32.2*  MCV 92.0 91.5  PLT 337 377     Cardiac Enzymes: No results for input(s): "CKTOTAL", "CKMB", "CKMBINDEX", "TROPONINI" in the last 168 hours.  BNP: Invalid input(s): "POCBNP"  CBG: Recent Labs  Lab 12/16/22 2006 12/16/22 2255 12/17/22 0137 12/17/22 0810 12/17/22 1312  GLUCAP 243* 119* 140* 226* 295*     Microbiology: Results for orders placed or performed during the hospital encounter of 06/12/22  Resp Panel by RT-PCR (Flu A&B, Covid) Anterior Nasal Swab     Status: None   Collection Time: 06/12/22  2:20 PM   Specimen: Anterior Nasal Swab  Result Value Ref Range Status   SARS Coronavirus 2 by RT PCR NEGATIVE NEGATIVE Final    Comment: (NOTE) SARS-CoV-2 target nucleic acids are NOT DETECTED.  The SARS-CoV-2 RNA is generally detectable in upper respiratory  specimens during the acute phase of infection. The lowest concentration of SARS-CoV-2 viral copies this assay can detect is 138 copies/mL. A negative result does not preclude SARS-Cov-2 infection and should not be used as the sole basis for treatment or other patient management decisions. A negative result may occur with  improper specimen collection/handling, submission of specimen other than nasopharyngeal swab, presence of viral mutation(s) within the areas targeted by this assay, and inadequate number of viral copies(<138 copies/mL). A negative result must be combined with clinical observations, patient history, and epidemiological information.  The expected result is Negative.  Fact Sheet for Patients:  BloggerCourse.com  Fact Sheet for Healthcare Providers:  SeriousBroker.it  This test is no t yet approved or cleared by the [place] FDA and  has been authorized for detection and/or diagnosis of SARS-CoV-2 by FDA under an Emergency Use Authorization (EUA). This EUA will remain  in effect (meaning this test can be used) for the duration of the COVID-19 declaration under Section 564(b)(1) of the Act, 21 U.S.C.section 360bbb-3(b)(1), unless the authorization is terminated  or revoked sooner.       Influenza A by PCR NEGATIVE NEGATIVE Final   Influenza B by PCR NEGATIVE NEGATIVE Final    Comment: (NOTE) The Xpert Xpress SARS-CoV-2/FLU/RSV plus assay is intended as an aid in the diagnosis of influenza from Nasopharyngeal swab specimens and should not be used as a sole basis for treatment. Nasal washings and aspirates are unacceptable for Xpert Xpress SARS-CoV-2/FLU/RSV testing.  Fact Sheet for Patients: BloggerCourse.com  Fact Sheet for Healthcare Providers: SeriousBroker.it  This test is not yet approved or cleared by the [place] FDA and has been authorized for detection and/or diagnosis of SARS-CoV-2 by FDA under an Emergency Use Authorization (EUA). This EUA will remain in effect (meaning this test can be used) for the duration of the COVID-19 declaration under Section 564(b)(1) of the Act, 21 U.S.C. section 360bbb-3(b)(1), unless the authorization is terminated or revoked.  Performed at [facility], [address] [place], [place] [address]     Coagulation Studies: No results for input(s): "LABPROT", "INR" in the last 72 hours.  Urinalysis: No results for input(s): "COLORURINE", "LABSPEC", "PHURINE", "GLUCOSEU", "HGBUR", "BILIRUBINUR", "KETONESUR", "PROTEINUR", "UROBILINOGEN", "NITRITE",  "LEUKOCYTESUR" in the last 72 hours.  Invalid input(s): "APPERANCEUR"    Imaging: No results found.   Medications:    sodium chloride      amLODipine  5 mg Oral Daily   aspirin EC  81 mg Oral Daily   calcitRIOL  0.25 mcg Oral Daily   furosemide  40 mg Oral Daily   [START ON 12/18/2022] furosemide  80 mg Oral Daily   heparin  5,000 Units Subcutaneous Q8H   insulin aspart  0-15 Units Subcutaneous TID WC   insulin aspart  0-5 Units Subcutaneous QHS   insulin aspart  10 Units Subcutaneous TID WC   insulin glargine-yfgn  50 Units Subcutaneous QHS   isosorbide mononitrate  30 mg Oral Daily   metoprolol succinate  50 mg Oral Daily   [START ON 12/18/2022] potassium chloride  20 mEq Oral Daily   pravastatin  40 mg Oral q1800   sodium chloride flush  3 mL Intravenous Q12H   tamsulosin  0.4 mg Oral QPC breakfast   sodium chloride, acetaminophen, albuterol, nitroGLYCERIN, ondansetron (ZOFRAN) IV, sodium chloride flush  Assessment/ Plan:  82 y.o. male with past medical history of long-standing diabetes mellitus type 2, hypertension, hyperlipidemia, allergic rhinitis, history of recurrent Bell's palsy, low testosterone  levels, nephrolithiasis with right UPJ obstruction status post laser lithotripsy, left UVJ obstruction status post ureteroscopy with stone removal and stent placement, left upper extremity AV fistula 09/19/2022, and obstructive sleep apnea who was admitted with chest pain and acute on chronic diastolic heart failure.  1.  Diabetes mellitus type 2 with chronic kidney disease/chronic kidney disease stage IV/proteinuria.  Clinically the patient is much improved.  Patient to be transitioned off of Lasix drip and restarted on Lasix 80 mg in a.m. and 40 mg in the p.m.  Continue potassium citrate at home as well.  2.  Acute on chronic diastolic heart failure.  Much improved.  Stop Lasix drip as above..  3.  Hypertension.  Continue amlodipine, metoprolol, and Imdur.  4.  Anemia of  chronic kidney disease.  Lab Results  Component Value Date   HGB 9.9 (L) 12/17/2022  Continue to monitor CBC as an outpatient.     LOS: 4 Raquel Sayres 5/28/20242:55 PM

## 2022-12-17 NOTE — Consult Note (Signed)
   Heart Failure Nurse Navigator Note  HFmrEF 45%.  Moderate LVH.  Mild mitral regurgitation.  Presented for his second IV infusion and nurse noted him to be labored breathing and midsternal chest tightness.  Recommended that he proceed to the emergency room.  BNP 361.  BMI 43.4.  Chest x-ray revealed diffuse opacities.  Comorbidities:  Anemia Asthma BPH Chronic kidney disease stage IV Diabetes Hyperlipidemia Hypertension Morbid obesity Obstructive sleep apnea treated with CPAP Osteo arthritis  Medications:  Norvasc 5 mg daily Aspirin 81 mg daily Furosemide infusion at 6 mg an hour Isosorbide mononitrate 30 mg daily Metoprolol succinate 50 mg daily Pravastatin 40 mg daily   Labs:  Sodium 138, potassium 2.9, chloride 106, CO2 24, BUN 80, creatinine 3.71, estimated GFR 16. Weight not documented Intake 720 mL Output 1750 mL  Meeting with patient on this admission.  He is lying quietly in bed in no acute distress.  States that he is feeling much better.  He states that he lives at home with his wife.  They do eat at restaurants twice a week.  He tries to get meals without gravies and etc. to be lower in sodium.  Discussed his fluid restriction dates that he has a 12 ounce cup that sits by his chair that he will drink water from all day long.  He says rather than guzzling that he will just take sips.  He also drinks 10 ounces of milk with breakfast and half can of Sprite.  He states that he does get conflicting instructions from physicians as mom will tell him he needs to be drinking as much as he can and then the other physician will tell him that he needs to be limiting.  Went over the reasoning for limiting fluids, he voices understanding.  He weighs himself daily rate had been running 307 to 309.  He is compliant with his medications.  Discussed follow up in the heart failure clinic  June 3 at 3:30 pm  He is a [facility] patient but does not qualify for the ventricle health  program due to his eGFR of 16.  He had no further questions.  Tresa Endo RN

## 2022-12-17 NOTE — TOC Transition Note (Signed)
Transition of Care [facility]) - CM/SW Discharge Note   Patient Details  Name: Markeece Largent MRN: [id number] Date of Birth: February 26, 1941  [facility]) CM/SW Contact:  Margarito Liner, LCSW Phone Number: 12/17/2022, 1:57 PM   Clinical Narrative:  Patient has orders to discharge home today. Left voicemail for [facility] liaison to notify. No further concerns. CSW signing off.    Final next level of care: Home w Home Health Services Barriers to Discharge: Barriers Resolved   Patient Goals and CMS Choice      Discharge Placement                      Patient and family notified of of transfer: 12/17/22  Discharge Plan and Services Additional resources added to the After Visit Summary for       Post Acute Care Choice: Home Health                    HH Arranged: RN, PT, OT [facility] Agency: [facility] Date [facility] Agency Contacted: 12/17/22 Time HH Agency Contacted: 1058 Representative spoke with at [facility] Agency: Amado Nash  Social Determinants of Health (SDOH) Interventions SDOH Screenings   Food Insecurity: No Food Insecurity (12/13/2022)  Housing: Patient Unable To Answer (12/13/2022)  Transportation Needs: No Transportation Needs (12/13/2022)  Utilities: Not At Risk (12/13/2022)  Depression (PHQ2-9): Low Risk  (10/30/2021)  Tobacco Use: Low Risk  (11/21/2022)     Readmission Risk Interventions    02/27/2022    4:22 PM  Readmission Risk Prevention Plan  Transportation Screening Complete  HRI or Home Care Consult Complete  Palliative Care Screening Not Applicable  Medication Review (RN Care Manager) Complete

## 2022-12-17 NOTE — Progress Notes (Signed)
*  PRELIMINARY RESULTS* Echocardiogram 2D Echocardiogram has been performed.  Carolyne Fiscal 12/17/2022, 3:43 PM

## 2022-12-17 NOTE — TOC Progression Note (Deleted)
Transition of Care [facility]) - Progression Note    Patient Details  Name: James Clarke MRN: [id number] Date of Birth: 01/18/1941  [facility]) CM/SW Contact  Margarito Liner, LCSW Phone Number: 12/17/2022, 1:55 PM  Clinical Narrative:  Patient has orders to discharge home today. Left voicemail for [facility] liaison to notify. No further concerns. CSW signing off.   Expected Discharge Plan: Home w Home Health Services Barriers to Discharge: Continued Medical Work up  Expected Discharge Plan and Services     Post Acute Care Choice: Home Health Living arrangements for the past 2 months: Single Family Home Expected Discharge Date: 12/17/22                         HH Arranged: PT, OT HH Agency: [facility] Date HH Agency Contacted: 12/15/22 Time HH Agency Contacted: 1058 Representative spoke with at [facility] Agency: Alyssa   Social Determinants of Health (SDOH) Interventions SDOH Screenings   Food Insecurity: No Food Insecurity (12/13/2022)  Housing: Patient Unable To Answer (12/13/2022)  Transportation Needs: No Transportation Needs (12/13/2022)  Utilities: Not At Risk (12/13/2022)  Depression (PHQ2-9): Low Risk  (10/30/2021)  Tobacco Use: Low Risk  (11/21/2022)    Readmission Risk Interventions    02/27/2022    4:22 PM  Readmission Risk Prevention Plan  Transportation Screening Complete  HRI or Home Care Consult Complete  Palliative Care Screening Not Applicable  Medication Review (RN Care Manager) Complete

## 2022-12-17 NOTE — Discharge Instructions (Signed)
Fluid restrict  Your last weight was 198.9lbs  Weight your self daily, if you gain 3 lbs in a day opr 5 lbs in a week, call the chf clinic or cardiology team.

## 2022-12-17 NOTE — Inpatient Diabetes Management (Signed)
Inpatient Diabetes Program Recommendations  AACE/ADA: New Consensus Statement on Inpatient Glycemic Control   Target Ranges:  Prepandial:   less than 140 mg/dL      Peak postprandial:   less than 180 mg/dL (1-2 hours)      Critically ill patients:  140 - 180 mg/dL    Latest Reference Range & Units 12/16/22 08:25 12/16/22 12:27 12/16/22 16:16 12/16/22 20:06 12/16/22 22:55 12/17/22 01:37 12/17/22 08:10  Glucose-Capillary 70 - 99 mg/dL 161 (H) 096 (H) 045 (H) 243 (H) 119 (H) 140 (H) 226 (H)   Review of Glycemic Control  Diabetes history: DM2 Outpatient Diabetes medications: Levemir 50 units QHS, Novolog 45 units with breakfast, 30 units with lunch, 45 units with supper Current orders for Inpatient glycemic control: Semglee 50 units QHS, Novolog 10 units TID with meals, Novolog 0-15 units TID with meals, Novolog 0-5 units QHS  Inpatient Diabetes Program Recommendations:    Insulin: Please consider increasing Semglee to 55 units QHS and meal coverage to Novolog 15 units TID with meals.  Thanks, Orlando Penner, RN, MSN, CDCES Diabetes Coordinator Inpatient Diabetes Program [phone] (Team Pager from 8am to 5pm)

## 2022-12-17 NOTE — Progress Notes (Signed)
[facility] CLINIC CARDIOLOGY CONSULT NOTE       Patient ID: James Clarke MRN: [id number] DOB/AGE: 01-23-1941 82 y.o.  Admit date: 12/13/2022 Referring Physician Dr. Huel Cote  Primary Physician Dr. Maryjane Hurter Primary Cardiologist [name], PA-C  Reason for Consultation AoCHF  HPI: James Clarke. Tung is an 82 year old male with a past medical history of CKD 4, chronic HFmrEF (EF 45% 06/2022), nonobstructive CAD by [facility] 12/2021, DM2 who presented to [facility] same-day surgery on 12/13/2022 for IV Lasix ordered by his nephrologist, Dr. Cherylann Ratel. Dr. Juliann Pares was contacted by nursing staff at [facility] same day surgery as the patient was experienced chest pressure and shortness of breath.  He was referred directly to [facility] ED and subsequently admitted.  He is diuresing well on a Lasix infusion.  Interval history: -Continues to feel better with less dyspnea on exertion, able to ambulate in the halls easier than he could prior to admission. -Renal function stable, diuresing well on a Lasix drip -No chest pain, shortness of breath at rest, heart racing or palpitations.  No peripheral edema.  Review of systems complete and found to be negative unless listed above     Past Medical History:  Diagnosis Date   (HFpEF) heart failure with preserved ejection fraction (HCC)    a.) TTE 11/26/2021: EF 45%, mod LVH, post HK, mod LAE, mild MR/TR, G2DD; b.) TTE 02/26/2022: EF 55-60%, BAE, triv MR, G2DD; c.) TTE 07/09/2022: EF 45%, post HK, LVH, mod LAE, triv TR/PR, mild MR, G1DD   Anemia of chronic renal failure    Aortic atherosclerosis (HCC)    Asthma    Bell's palsy    BPH (benign prostatic hyperplasia)    CAD (coronary artery disease)    a.) LHC 12/31/2021: 60% mLAD, 20% pRCA, 20% dRCA, 20% o-pLCx --> med mgmt.   CKD (chronic kidney disease), stage IV (HCC)    Diabetic neuropathy (HCC)    Dyspnea on exertion    Ganglion cyst of wrist, right    a.) s/p excision 04/2011   Hepatic steatosis    History of 2019  novel coronavirus disease (COVID-19)    History of bilateral cataract extraction 2018   Hyperlipidemia    Hyperparathyroidism due to renal insufficiency (HCC)    Hypertension    Low testosterone in male    Nephrolithiasis    Obesity    OSA treated with BiPAP    Osteoarthritis    Peripheral edema    Right inguinal hernia    a.) s/p repair   Skin cancer    Type 2 diabetes mellitus with renal manifestations [facility])     Past Surgical History:  Procedure Laterality Date   APPENDECTOMY     AV FISTULA PLACEMENT Left 09/19/2022   Procedure: ARTERIOVENOUS (AV) FISTULA CREATION (RADIO CEPHALIC);  Surgeon: Annice Needy, MD;  Location: [facility] ORS;  Service: Vascular;  Laterality: Left;   CATARACT EXTRACTION W/PHACO Left 05/20/2017   Procedure: CATARACT EXTRACTION PHACO AND INTRAOCULAR LENS PLACEMENT (IOC);  Surgeon: Galen Manila, MD;  Location: [facility] ORS;  Service: Ophthalmology;  Laterality: Left;  [place] 00:32.0 AP% 15.5 CDE 4.97 Fluid Pack Lot # [id number]   CATARACT EXTRACTION W/PHACO Right 06/10/2017   Procedure: CATARACT EXTRACTION PHACO AND INTRAOCULAR LENS PLACEMENT (IOC);  Surgeon: Galen Manila, MD;  Location: [facility] ORS;  Service: Ophthalmology;  Laterality: Right;  [place]  00:51 AP% 15.4 CDE 7.95 Fluid pack lot # [id number]   CHOLECYSTECTOMY     COLONOSCOPY     x3   CYSTOSCOPY W/  RETROGRADES Right 08/13/2019   Procedure: CYSTOSCOPY WITH RETROGRADE PYELOGRAM;  Surgeon: Sondra Come, MD;  Location: [facility] ORS;  Service: Urology;  Laterality: Right;   CYSTOSCOPY WITH STENT PLACEMENT Right 07/26/2019   Procedure: CYSTOSCOPY WITH STENT PLACEMENT;  Surgeon: Crista Elliot, MD;  Location: [facility] ORS;  Service: Urology;  Laterality: Right;   CYSTOSCOPY/URETEROSCOPY/HOLMIUM LASER/STENT PLACEMENT Right 08/13/2019   Procedure: CYSTOSCOPY/URETEROSCOPY/HOLMIUM LASER/STENT EXCHANGE;  Surgeon: Sondra Come, MD;  Location: [facility] ORS;  Service: Urology;  Laterality: Right;    CYSTOSCOPY/URETEROSCOPY/HOLMIUM LASER/STENT PLACEMENT Left 02/27/2022   Procedure: CYSTOSCOPY/URETEROSCOPY/HOLMIUM LASER/STENT PLACEMENT;  Surgeon: Riki Altes, MD;  Location: [facility] ORS;  Service: Urology;  Laterality: Left;   GANGLION CYST EXCISION Right    INGUINAL HERNIA REPAIR Right    LEFT HEART CATH AND CORONARY ANGIOGRAPHY N/A 12/31/2021   Procedure: LEFT HEART CATH AND CORONARY ANGIOGRAPHY;  Surgeon: Lamar Blinks, MD;  Location: [facility] INVASIVE CV LAB;  Service: Cardiovascular;  Laterality: N/A;   SHOULDER ARTHROSCOPY WITH SUBACROMIAL DECOMPRESSION AND OPEN ROTATOR C Right 09/11/2020   Procedure: Right shoulder arthroscopic rotator cuff repair vs Regeneten patch application, subacromial decompression, and biceps tenodesis - Dedra Skeens to Assist;  Surgeon: Signa Kell, MD;  Location: [facility] ORS;  Service: Orthopedics;  Laterality: Right;   TEMPORARY DIALYSIS CATHETER N/A 03/05/2022   Procedure: TEMPORARY DIALYSIS CATHETER;  Surgeon: Renford Dills, MD;  Location: [facility] INVASIVE CV LAB;  Service: Cardiovascular;  Laterality: N/A;   TONSILLECTOMY     URETEROSCOPY WITH HOLMIUM LASER LITHOTRIPSY Left 02/27/2022   Procedure: URETEROSCOPY WITH HOLMIUM LASER LITHOTRIPSY;  Surgeon: Riki Altes, MD;  Location: [facility] ORS;  Service: Urology;  Laterality: Left;   WRIST FRACTURE SURGERY Right     Medications Prior to Admission  Medication Sig Dispense Refill Last Dose   acetaminophen (TYLENOL) 500 MG tablet Take 1,000 mg by mouth every 6 (six) hours as needed (shoulder pain).   12/12/2022   albuterol (PROVENTIL HFA;VENTOLIN HFA) 108 (90 Base) MCG/ACT inhaler Inhale 2 puffs into the lungs every 6 (six) hours as needed for wheezing or shortness of breath.   12/13/2022   amLODipine (NORVASC) 5 MG tablet Take 5 mg by mouth daily.   12/13/2022   aspirin EC 81 MG tablet Take by mouth.   12/13/2022   calcitRIOL (ROCALTROL) 0.25 MCG capsule Take 0.25 mcg by mouth daily.   12/13/2022   cetirizine  (ZYRTEC) 10 MG chewable tablet Chew 10 mg by mouth daily as needed for allergies.   12/13/2022   fluticasone (FLONASE) 50 MCG/ACT nasal spray as needed.   12/12/2022   furosemide (LASIX) 40 MG tablet Take 80 mg q am and 40 mg qpm 60 tablet 2 12/12/2022   hydrALAZINE (APRESOLINE) 50 MG tablet Take 50 mg by mouth 3 (three) times daily.   12/13/2022   insulin aspart (NOVOLOG) 100 UNIT/ML injection Inject into the skin 3 (three) times daily with meals. Inject 45 units before breakfast, 30 units ant lunch,45 units before supper   12/13/2022   insulin detemir (LEVEMIR) 100 UNIT/ML injection Inject 0.5 mLs (50 Units total) into the skin at bedtime. 10 mL 1 12/12/2022   isosorbide mononitrate (IMDUR) 30 MG 24 hr tablet Take 1 tablet (30 mg total) by mouth daily. 30 tablet 2 12/13/2022   lovastatin (MEVACOR) 40 MG tablet Take 40 mg by mouth daily with supper.   12/12/2022   metoprolol succinate (TOPROL-XL) 50 MG 24 hr tablet Take 50 mg by mouth daily.   12/13/2022  potassium citrate (UROCIT-K) 10 MEQ (1080 MG) SR tablet TAKE (1) TABLET BY MOUTH TWICE DAILY WITH A MEAL. 60 tablet 11 12/13/2022   tamsulosin (FLOMAX) 0.4 MG CAPS capsule TAKE (1) CAPSULE BY MOUTH EVERY DAY 30 capsule 1 12/13/2022   traMADol (ULTRAM) 50 MG tablet Take 1 tablet (50 mg total) by mouth every 12 (twelve) hours as needed. 30 tablet 1 Past Month   AMLODIPINE BESYLATE PO Take 5 mg by mouth daily at 6 (six) AM. (Patient not taking: Reported on 12/13/2022)   Not Taking   Continuous Blood Gluc Sensor (FREESTYLE LIBRE 14 DAY SENSOR) MISC by Does not apply route.      GLOBAL INJECT EASE INSULIN SYR 31G X 5/16" 0.5 ML MISC Inject into the skin.      glucose blood (ONETOUCH ULTRA) test strip TEST BLOOD SUGAR 4 TIMES DAILY      Lancets (ONETOUCH DELICA PLUS LANCET30G) MISC USE 1 LANCET 4 TIMES DAILY      nitroGLYCERIN (NITROSTAT) 0.4 MG SL tablet Place 1 tablet (0.4 mg total) under the tongue every 5 (five) minutes as needed for chest pain. 20 tablet 1  prn at unk   Social History   Socioeconomic History   Marital status: Married    Spouse name: Not on file   Number of children: Not on file   Years of education: Not on file   Highest education level: Not on file  Occupational History   Not on file  Tobacco Use   Smoking status: Never    Passive exposure: Never   Smokeless tobacco: Never  Vaping Use   Vaping Use: Never used  Substance and Sexual Activity   Alcohol use: No   Drug use: Not Currently   Sexual activity: Yes    Birth control/protection: None  Other Topics Concern   Not on file  Social History Narrative   Not on file   Social Determinants of Health   Financial Resource Strain: Not on file  Food Insecurity: No Food Insecurity (12/13/2022)   Hunger Vital Sign    Worried About Running Out of Food in the Last Year: Never true    Ran Out of Food in the Last Year: Never true  Transportation Needs: No Transportation Needs (12/13/2022)   PRAPARE - Administrator, Civil Service (Medical): No    Lack of Transportation (Non-Medical): No  Physical Activity: Not on file  Stress: Not on file  Social Connections: Not on file  Intimate Partner Violence: Not At Risk (12/13/2022)   Humiliation, Afraid, Rape, and Kick questionnaire    Fear of Current or Ex-Partner: No    Emotionally Abused: No    Physically Abused: No    Sexually Abused: No    Family History  Problem Relation Age of Onset   Emphysema Mother    COPD Mother    Heart disease Mother    Brain cancer Father       Intake/Output Summary (Last 24 hours) at 12/17/2022 0928 Last data filed at 12/17/2022 1610 Gross per 24 hour  Intake 720 ml  Output 2050 ml  Net -1330 ml    Vitals:   12/16/22 2335 12/17/22 0547 12/17/22 0700 12/17/22 0800  BP: (!) 122/56 (!) 146/67  (!) 151/76  Pulse: 63 63  69  Resp: 17 19 18 20   Temp: 97.9 F (36.6 C) 98 F (36.7 C)  98.4 F (36.9 C)  TempSrc:    Oral  SpO2: 97% 97%  98%  Weight:  Height:         PHYSICAL EXAM General: elderly male , well nourished, in no acute distress. Laying at low incline in bed, wife prsent HEENT:  Normocephalic and atraumatic. Neck:  No JVD.  Lungs: Normal respiratory effort on room air. Clear bilaterally to auscultation. No wheezes, crackles, rhonchi.  Heart: HRRR . Normal S1 and S2 without gallops or murmurs.  Abdomen: Non-distended appearing with excess adiposity.  Msk: Normal strength and tone for age. Extremities: Warm and well perfused. No clubbing, cyanosis. No peripheral edema.  Neuro: Alert and oriented X 3. Psych:  Answers questions appropriately.   Labs: Basic Metabolic Panel: Recent Labs    12/16/22 0417 12/17/22 0422  NA 138 143  K 2.9* 4.5  CL 106 108  CO2 24 25  GLUCOSE 220* 207*  BUN 80* 80*  CREATININE 3.71* 3.57*  CALCIUM 8.1* 8.5*  MG 2.3  --    Liver Function Tests: No results for input(s): "AST", "ALT", "ALKPHOS", "BILITOT", "PROT", "ALBUMIN" in the last 72 hours. No results for input(s): "LIPASE", "AMYLASE" in the last 72 hours. CBC: No results for input(s): "WBC", "NEUTROABS", "HGB", "HCT", "MCV", "PLT" in the last 72 hours. Cardiac Enzymes: No results for input(s): "CKTOTAL", "CKMB", "CKMBINDEX", "TROPONINIHS" in the last 72 hours. BNP: No results for input(s): "BNP" in the last 72 hours. D-Dimer: No results for input(s): "DDIMER" in the last 72 hours. Hemoglobin A1C: No results for input(s): "HGBA1C" in the last 72 hours. Fasting Lipid Panel: No results for input(s): "CHOL", "HDL", "LDLCALC", "TRIG", "CHOLHDL", "LDLDIRECT" in the last 72 hours. Thyroid Function Tests: No results for input(s): "TSH", "T4TOTAL", "T3FREE", "THYROIDAB" in the last 72 hours.  Invalid input(s): "FREET3" Anemia Panel: No results for input(s): "VITAMINB12", "FOLATE", "FERRITIN", "TIBC", "IRON", "RETICCTPCT" in the last 72 hours.   Radiology: US Venous Img Lower Bilateral  Result Date: 12/13/2022 CLINICAL DATA:  Dyspnea EXAM:  BILATERAL LOWER EXTREMITY VENOUS DOPPLER ULTRASOUND TECHNIQUE: Gray-scale sonography with graded compression, as well as color Doppler and duplex ultrasound were performed to evaluate the lower extremity deep venous systems from the level of the common femoral vein and including the common femoral, femoral, profunda femoral, popliteal and calf veins including the posterior tibial, peroneal and gastrocnemius veins when visible. The superficial great saphenous vein was also interrogated. Spectral Doppler was utilized to evaluate flow at rest and with distal augmentation maneuvers in the common femoral, femoral and popliteal veins. COMPARISON:  None Available. FINDINGS: RIGHT LOWER EXTREMITY Common Femoral Vein: No evidence of thrombus. Normal compressibility, respiratory phasicity and response to augmentation. Saphenofemoral Junction: No evidence of thrombus. Normal compressibility and flow on color Doppler imaging. Profunda Femoral Vein: No evidence of thrombus. Normal compressibility and flow on color Doppler imaging. Femoral Vein: No evidence of thrombus. Normal compressibility, respiratory phasicity and response to augmentation. Popliteal Vein: No evidence of thrombus. Normal compressibility, respiratory phasicity and response to augmentation. Calf Veins: No evidence of thrombus. Normal compressibility and flow on color Doppler imaging. Superficial Great Saphenous Vein: No evidence of thrombus. Normal compressibility. Venous Reflux:  None. Other Findings:  None. LEFT LOWER EXTREMITY Common Femoral Vein: No evidence of thrombus. Normal compressibility, respiratory phasicity and response to augmentation. Saphenofemoral Junction: No evidence of thrombus. Normal compressibility and flow on color Doppler imaging. Profunda Femoral Vein: No evidence of thrombus. Normal compressibility and flow on color Doppler imaging. Femoral Vein: No evidence of thrombus. Normal compressibility, respiratory phasicity and response to  augmentation. Popliteal Vein: No evidence of thrombus. Normal compressibility, respiratory phasicity  and response to augmentation. Calf Veins: No evidence of thrombus. Normal compressibility and flow on color Doppler imaging. Superficial Great Saphenous Vein: No evidence of thrombus. Normal compressibility. Venous Reflux:  None. Other Findings:  None. IMPRESSION: No evidence of deep venous thrombosis in either lower extremity. Electronically Signed   By: Malachy Moan M.D.   On: 12/13/2022 14:57   X-ray chest PA or AP  Result Date: 12/13/2022 CLINICAL DATA:  Digestive heart failure. EXAM: CHEST  1 VIEW COMPARISON:  June 14, 2022. FINDINGS: Stable cardiomediastinal silhouette. Bilateral diffuse reticular opacities are noted, but acute superimposed edema or atypical inflammation can not be excluded. Bony thorax is unremarkable. IMPRESSION: Bilateral diffuse reticular opacities are again noted as described above. Electronically Signed   By: Lupita Raider M.D.   On: 12/13/2022 11:26   VAS US DUPLEX DIALYSIS ACCESS (AVF, AVG)  Result Date: 11/21/2022 DIALYSIS ACCESS Patient Name:  Marquiz Garman  Date of Exam:   11/21/2022 Medical Rec #: [id number]        Accession #:    [id number] Date of Birth: 03/05/1941       Patient Gender: M Patient Age:   66 years Exam Location:  [facility] Procedure:      VAS US DUPLEX DIALYSIS ACCESS (AVF, AVG) Referring Phys: Sheppard Plumber --------------------------------------------------------------------------------  Reason for Exam: Routine follow up. Access Site: Left Upper Extremity. Access Type: Radial-cephalic AVF. History: Patient complains of left hand numbness. Performing Technologist: Hardie Lora RVT  Examination Guidelines: A complete evaluation includes B-mode imaging, spectral Doppler, color Doppler, and power Doppler as needed of all accessible portions of each vessel. Unilateral testing is considered an integral part of a complete examination.  Limited examinations for reoccurring indications may be performed as noted.  Findings: +--------------------+----------+-----------------+--------+ AVF                 PSV (cm/s)Flow Vol (mL/min)Comments +--------------------+----------+-----------------+--------+ Native artery inflow   144          1329                +--------------------+----------+-----------------+--------+ AVF Anastomosis        665                              +--------------------+----------+-----------------+--------+  +------------+----------+-------------+----------+--------+ OUTFLOW VEINPSV (cm/s)Diameter (cm)Depth (cm)Describe +------------+----------+-------------+----------+--------+ Confluence     198        0.67                        +------------+----------+-------------+----------+--------+ Clavicle       125        0.56                        +------------+----------+-------------+----------+--------+ Shoulder       136        0.56                        +------------+----------+-------------+----------+--------+ Prox UA        133        0.59                        +------------+----------+-------------+----------+--------+ Mid UA         111        0.66                        +------------+----------+-------------+----------+--------+  Dist UA        108        0.72                        +------------+----------+-------------+----------+--------+ AC Fossa       125        0.66                        +------------+----------+-------------+----------+--------+ Prox Forearm   155        0.55                        +------------+----------+-------------+----------+--------+ Mid Forearm    183        0.51                        +------------+----------+-------------+----------+--------+ Dist Forearm   285        0.39                        +------------+----------+-------------+----------+--------+ Right second digit pressure 179 mm Hg. Left second  digit pressure 118 mm Hg, 193 mm Hg with manual compression of AVF   Summary: Patent arteriovenous fistula.  Left second digit pressure increased 118 mm Hg to 193 mm Hg with manual compression of AVF.  *See table(s) above for measurements and observations.  Diagnosing physician: Levora Dredge MD Electronically signed by Levora Dredge MD on 11/21/2022 at 3:23:57 PM.   --------------------------------------------------------------------------------   Final     ECHO 06/2022 MILD LV SYSTOLIC DYSFUNCTION (See above)   WITH MODERATE LVH  NORMAL RIGHT VENTRICULAR SYSTOLIC FUNCTION  MILD VALVULAR REGURGITATION (See above)  NO VALVULAR STENOSIS  IRREGULAR HEART RHYTHM CAPTURED THROUGHOUT EXAM  ESTIMATED LVEF 45%  TRIVIAL EFFUSION POSTERIORLY  NO SIGNIFICANT CHANGE FROM PREVIOUS ECHO  _________________________________________________________________________________________  Electronically signed by      MD Jamse Mead on 07/11/2022 04: 32 PM           Performed By: Verdis Prime     Ordering Physician: [name]    TELEMETRY reviewed by me (LT) 12/17/2022 : sinus brady to NSR rate 58-78  EKG reviewed by me: NSR PACs rate 67 bpm  Data reviewed by me (LT) 12/17/2022: nephrology note, hospitalist progress note, last 24h vitals tele labs imaging I/O    Principal Problem:   Acute on chronic diastolic CHF (congestive heart failure) (HCC) Active Problems:   Type 2 diabetes mellitus with renal manifestations (HCC)   Sleep apnea   Obesity, Class III, BMI 40-49.9 (morbid obesity) (HCC)   Hypertension   Chronic kidney disease (CKD), stage IV (severe) (HCC)   Goals of care, counseling/discussion   Myocardial injury   Hypokalemia    ASSESSMENT AND PLAN:  Keanon Vetrano. Gloe is an 82 year old male with a past medical history of CKD 4, chronic HFmrEF (EF 45% 06/2022), nonobstructive CAD by [facility] 12/2021, DM2 who presented to [facility] same-day surgery on 12/13/2022 for IV Lasix ordered by his nephrologist, Dr.  Cherylann Ratel. Dr. Juliann Pares was contacted by nursing staff at [facility] same day surgery as the patient was experienced chest pressure and shortness of breath.  He was referred directly to [facility] ED and subsequently admitted.  He is diuresing well on a Lasix infusion.  # Acute on chronic HFmrEF (45%, 06/2022) Diuresing well on a Lasix infusion, relatively euvolemic on exam without hypoxia or peripheral edema. -Weight  is down from 307 pounds on admission to 298.9 pounds today. -Nephrology managing diuretics, switching Lasix drip back to home p.o. dosing of Lasix 80 mg in the morning and 40 mg at night.  With a 20 mEq potassium supplement daily -Continue metoprolol for GDMT, further adjustments with ARB, SGLT2i, and MRA limited by baseline renal insufficiency. -Defer additional cardiac diagnostics at this time  # CKD 4  Stable despite diuresis with BUN/creatinine 80/3.57 and current GFR of 16. -Continue to monitor closely.  # demand ischemia  Borderline elevated and flat trending and EKG without acute ischemic changes most consistent with demand/supply mismatch and not ACS   This patient's plan of care was discussed and created with Dr. Darrold Junker and he is in agreement.  Signed: Rebeca Allegra , PA-C 12/17/2022, 9:28 AM [facility] Cardiology

## 2022-12-17 NOTE — Plan of Care (Signed)
  Problem: Education: Goal: Knowledge of General Education information will improve Description: Including pain rating scale, medication(s)/side effects and non-pharmacologic comfort measures Outcome: Progressing   Problem: Health Behavior/Discharge Planning: Goal: Ability to manage health-related needs will improve Outcome: Progressing   Problem: Clinical Measurements: Goal: Ability to maintain clinical measurements within normal limits will improve Outcome: Progressing Goal: Will remain free from infection Outcome: Progressing Goal: Diagnostic test results will improve Outcome: Progressing Goal: Respiratory complications will improve Outcome: Progressing Goal: Cardiovascular complication will be avoided Outcome: Progressing   Problem: Activity: Goal: Risk for activity intolerance will decrease Outcome: Progressing   Problem: Nutrition: Goal: Adequate nutrition will be maintained Outcome: Progressing   Problem: Coping: Goal: Level of anxiety will decrease Outcome: Progressing   Problem: Elimination: Goal: Will not experience complications related to bowel motility Outcome: Progressing Goal: Will not experience complications related to urinary retention Outcome: Progressing   Problem: Pain Managment: Goal: General experience of comfort will improve Outcome: Progressing   Problem: Safety: Goal: Ability to remain free from injury will improve Outcome: Progressing   Problem: Education: Goal: Ability to describe self-care measures that may prevent or decrease complications (Diabetes Survival Skills Education) will improve Outcome: Progressing Goal: Individualized Educational Video(s) Outcome: Progressing   Problem: Coping: Goal: Ability to adjust to condition or change in health will improve Outcome: Progressing   Problem: Metabolic: Goal: Ability to maintain appropriate glucose levels will improve Outcome: Progressing   Problem: Nutritional: Goal:  Maintenance of adequate nutrition will improve Outcome: Progressing Goal: Progress toward achieving an optimal weight will improve Outcome: Progressing   Problem: Skin Integrity: Goal: Risk for impaired skin integrity will decrease Outcome: Progressing   Problem: Tissue Perfusion: Goal: Adequacy of tissue perfusion will improve Outcome: Progressing

## 2022-12-18 LAB — ECHOCARDIOGRAM COMPLETE
AR max vel: 2.17 cm2
AV Area VTI: 2.23 cm2
AV Area mean vel: 2.15 cm2
AV Mean grad: 6 mmHg
AV Peak grad: 11.4 mmHg
Ao pk vel: 1.69 m/s
Area-P 1/2: 4.39 cm2
Height: 70 in
MV VTI: 2.73 cm2
S' Lateral: 4.4 cm

## 2022-12-19 ENCOUNTER — Ambulatory Visit (INDEPENDENT_AMBULATORY_CARE_PROVIDER_SITE_OTHER): Payer: PPO | Admitting: Nurse Practitioner

## 2022-12-19 VITALS — BP 140/69 | HR 78 | Resp 18 | Ht 70.0 in | Wt 307.4 lb

## 2022-12-19 DIAGNOSIS — G4733 Obstructive sleep apnea (adult) (pediatric): Secondary | ICD-10-CM | POA: Diagnosis not present

## 2022-12-19 DIAGNOSIS — N184 Chronic kidney disease, stage 4 (severe): Secondary | ICD-10-CM | POA: Diagnosis not present

## 2022-12-19 DIAGNOSIS — I10 Essential (primary) hypertension: Secondary | ICD-10-CM

## 2022-12-19 DIAGNOSIS — E785 Hyperlipidemia, unspecified: Secondary | ICD-10-CM

## 2022-12-20 ENCOUNTER — Encounter (INDEPENDENT_AMBULATORY_CARE_PROVIDER_SITE_OTHER): Payer: Self-pay | Admitting: Nurse Practitioner

## 2022-12-20 DIAGNOSIS — Z794 Long term (current) use of insulin: Secondary | ICD-10-CM | POA: Diagnosis not present

## 2022-12-20 DIAGNOSIS — E1122 Type 2 diabetes mellitus with diabetic chronic kidney disease: Secondary | ICD-10-CM | POA: Diagnosis not present

## 2022-12-20 DIAGNOSIS — I130 Hypertensive heart and chronic kidney disease with heart failure and stage 1 through stage 4 chronic kidney disease, or unspecified chronic kidney disease: Secondary | ICD-10-CM | POA: Diagnosis not present

## 2022-12-20 DIAGNOSIS — D631 Anemia in chronic kidney disease: Secondary | ICD-10-CM | POA: Diagnosis not present

## 2022-12-20 DIAGNOSIS — N184 Chronic kidney disease, stage 4 (severe): Secondary | ICD-10-CM | POA: Diagnosis not present

## 2022-12-20 DIAGNOSIS — I2510 Atherosclerotic heart disease of native coronary artery without angina pectoris: Secondary | ICD-10-CM | POA: Diagnosis not present

## 2022-12-20 DIAGNOSIS — I5A Non-ischemic myocardial injury (non-traumatic): Secondary | ICD-10-CM | POA: Diagnosis not present

## 2022-12-20 DIAGNOSIS — I5033 Acute on chronic diastolic (congestive) heart failure: Secondary | ICD-10-CM | POA: Diagnosis not present

## 2022-12-20 DIAGNOSIS — G4730 Sleep apnea, unspecified: Secondary | ICD-10-CM | POA: Diagnosis not present

## 2022-12-20 NOTE — H&P (View-Only) (Signed)
 Subjective:    Patient ID: James Clarke, male    DOB: 01/28/1941, 82 y.o.   MRN: 2904224 Chief Complaint  Patient presents with  . Follow-up          HPI  Review of Systems  All other systems reviewed and are negative.      Objective:   Physical Exam Vitals reviewed.  HENT:     Head: Normocephalic.  Cardiovascular:     Rate and Rhythm: Normal rate.     Arteriovenous access: Left arteriovenous access is present.    Comments: Good bruit and proximal  iiiiiiiiiiiiiiiiiiiiiiiiiiiiiiiiiiiiiiiiiiiiiiiiiiiiiiiiiiiiiiiiiiiiiiiiiiiiiiiiiiiiiiiiiiiiiiiiiiiiiiiiiiiiiiiiiiiiiiiiiiiiiiiiiiiiiiiiiiiiiiiiiiiiiiiiiiiiiiiiiiiiiiiiiiiiiiiiiiiiiiiiiiiiiiiiiiiiiiiiiiiiiiiiiiiiiiiiiiiiiiiiiiiiiiiiiiiiiiiiiiiiiiiiiiiiiiiiiiiiiiiiiiiiiiiiiiiiiiiiiiiiiiiiiiiiiiiiiiiiiiiiiiiiiiiiiiiiiiiiiiiiiiiiiiiiiiiiiiiiiiiiiiiiiiiiiiiiiiiiiiiiiiiiiiiiiiiiiiiiiiiiiiiiiiiiiiiiiiiiiiiiiiiiiiiiiiiiiiiiiiiiiiiiiiiiiiiiiiiiiiiiiiiiiiiiiiiiiiiiiiiiiiiiiiiiiiiiiiiiiiiiiiiiiiiiiiiiiiiiiiiiiiiiiiiiiiiiiiiiiiiiiiiiiiiiiiiiiiiiiiiiiiiiiiiiiiiiiiiiiiiiiiiiiiiiiiiiiiiiiiiiiiiiiiiiiiiiiiiiiiiiiiiiiiiiiiiiiiiiiiiiiiiiiiiiiiiiiiiiiiiiiiiiiiiiiiiiiiiiiiiiiiiiiiiiiiiiiiiiiiiiiiiiiiiiiiiiiiiiiiiiiiiiiiiiiiiiiiiiiiiiiiiiiiiiiiiiiiiiiiiiiiiiiiiiiiiiiiiiiiiiiiiiiiiiiiiiiiiiiiiiiiiiiiiiiiiiiiiiiiiiiiiiiiiiiiiiiiiiiiiiiiiiiiiiiiiiiiiiiiiiiiiiiiiiiiiiiiiiiiiiiiiiiiiiiiiiiiiiiiiiiiiiiiiiiiiiiiiiiiiiiiiiiiiiiiiiiiiiiiiiiiiiiiiiiiiiiiiiiiiiiiiiiiiiiiiiiiiiiiiiiiiiiiiiiiiiiiiiiiiiiiiiiiiiiiiiiiiiiiiiiiiiiiiiiiiiiiiiiiiiiiiiiiiiiiiiiiiiiiiiiiiiiiiiiiiiiiiiiiiiiiiiiiiiiiiiiiiiiiiiiiiiiiiiiiiiiiiiiiiiiiiiiiiiiiiiiiiiiiiiiiiiiiiiiiiiiiiiiiiiiiiiiiiiiiiiiiiiiiiiiiiiiiiiiiiiiiiiiiiiiiiiiiiiiiiiiiiiiiiiiiiiiiiiiiiiiiiiiiiiiiiiiiiiiiiiiiiiiiiiiiiiiiiiiiiiiiiiiiiiiiiiiiiiiiiiiiiiiiiiiiiiiiiiiiiiiiiiiiiiiiiiiiiiiiiiiiiiiiiiiiiiiiiiiiiiiiiiiiiiiiiiiiiiiiiiiiiiiiiiiiiiiiiiiiiiiiiiiiiiiiiiiiiiiiiiiiiiiiiiiiiiiiiiiiiiiiiiiiiiiiiiiiiiiiiiiiiiiiiiiiiiiiiiiiiiiiiiiiiiiiiiiiiiiiiiiiiiiiiiiiiiiiiiiiiiiiiiiiiiiiiiiiiiiiiiiiiiiiiiiiiiiiiiiiiiiiiiiiiiiiiiiiiiiiiiiiiiiiiiiiiiiiiiiiiiiiiiiiiiiiiiiiiiiiiiiiiiiiiiiiiiiiiiiiiiiiiiiiiiiiiiiiiiiiiiiiiiiiiiiiiiiiiiiiiiiiiiiiiiiiiiiiiiiiiiiiiiiiiiiiiiiiiiiiiiiiiiiiiiiiiiiiiiiiiiiiiiiiiiiiiiiiiiiiiiiiiiiiiiiiiiiiiiiiiiiiiiiiiiiiiiiiiiiiiiiiiiiiiiiiiiiiiiiiiiiiiiiiiiiiiiiiiiiiiiiiiiiiiiiiiiiiiiiiiiiiiiiiiiiiiiiiiiiiiiiiiiiiiiiiiiiiiiiiiiiiiiiiiiiiiiiiiiiiiiiiiiiiiiiiiiiiiiiiiiiiiiiiiiiiiiiiiiiiiiiiiiiiiiiiiiii iiiiiiiiiiiiiiiiiiiiiiiiiiiiiiiiiiiiiiiiiiiiiiiiiiiiiiiiiiiiiiiiiiiiiiiiiiiiiiiiiiiiiiiiiiiiiiiiiiiiiiiiiiiiiiiiiiiiiiiiiiiiiiiiiiiiiiiiiiiiiiiiiiiiiiiiiiiiiiiiiiiiiiiiiiiiiiiiiiiiiiiiiiiiiiiiiiiiiiiiiiiiiiiiiiiiiiiiiiiiiiiiiiiiiiiiiiiiiiiiiiiiiiiiiiiiiiiiiiiiiiiiiiiiiiiiiiiiiiiiiiiiiiiiiiiiiiiiiiiiiiiiiiiiiiiiiiiiiiiiiiiiiiiiiiiiiiiiiiiiiiiiiiiiiiiiiiiiiiiiiiiiiiiiiiiiiiiiiiiiiiiiiiiiiiiiiiiiiiiiiiiiiiiiiiiiiiiiiiiiiiiiiiiiiiiiiiiiiiiiiiiiiiiiiiiiiiiiiiiiiiiiiiiiiiiiiiiiiiiiiiiiiiiiiiiiiiiiiiiiiiiiiiiiiiiiiiiiiiiiiiiiiiiiiiiiiiiiiiiiiiiiiiiiiiiiiiiiiiiiiiiiiiiiiiiiiiiiiiiiiiiiiiiiiiiiiiiiiiiiiiiiiiiiiiiiiiiiiiiiiiiiiiiiiiiiiiiiiiiiiiiiiiiiiiiiiiiiiiiiiiiiiiiiiiiiiiiiiiiiiiiiiiiiiiiiiiiiiiiiiiiiiiiiiiiiiiiiiiiiiiiiiiiiiiiiiiiiiiiiiiiiiiiiiiiiiiiiiiiiiiiiiiiiiiiiiiiiiiiiiiiiiiiiiiiiiiiiiiiiiiiiiiiiiiiiiiiiiiiiiiiiiiiiiiiiiiiiiiiiiiiiiiiiiiiiiiiiiiiiiiiiiiiiiiiiiiiiiiiiiiiiiiiiiiiiiiiiiiiiiiiiiiiiiiiiiiiiiiiiiiiiiiiiiiiiiiiiiiiiiiiiiiiiiiiiiiiiiiiiiiiiiiiiiiiiiiiiiiiiiiiiiiiiiiiiiiiiiiiiiiiiiiiiiiiiiiiiiiiiiiiiiiiiiiiiiiiiiiiiiiiiiiiiiiiiiiiiiiiiiiiiiiiiiiiiiiiiiiiiiiiiiiiiiiiiiiiiiiiiiiiiiiiiiiiiiiiiiiiiiiiiiiiiiiiiiiiiiiiiiiiiiiiiiiiiiiiiiiiiiiiiiiiiiiiiiiiiiiiiiiiiiiiiiiiiiiiiiiiiiiiiiiiiiiiiiiiiiiiiiiiiiiiiiiiiiiiiiiiiiiiiiiiiiiiiiiiiiiiiiiiiiiiiiiiiiiiiiiiiiiiiiiiiiiiiiiiiiiiiiiiiiiiiiiiiiiiiiiiiiiiiiiiiiiiiiiiiiiiiiiiiiiiiiiiiiiiiiiiiiiiiiiiiiiiiiiiiiiiiiiiiiiiiiiiiiiiiiiiiiiiiiiiiiiiiiiiiiiiiiiiiiiiiiiiiiiiiiiiiiiiiiiiiiiiiiiiiiiiiiiiiiiiiiiiiiiiiiiiiiiiiiiiiiiiiiiiiiiiiiiiiiiiiiiiiiiiiiiiiiiiiiiiiiiiiiiiiiiiiiiiiiiiiiiiiiiiiiiiiiiiiiiiiiiiiiiiiiiiiiiiiiiiiiiiiiiiiiiiiiiiiiiiiiiiiiiiiiiiiiiiiiiiiiiiiiiiiiiiiiiiiiiiiiiiiiiiiiiiiiiiiiiiiiiiiiiiiiiiiiiiiiiiiiiiiiiiiiiiiiiiiiiiiiiiiiiiiiiiiiiiiiiiiiiiiiiiiiiiiiiiiiiiiiiiiiiiiiiiiiiiiiiiiiiiiiiiiiiiiiiiiiiiiiiiiiiiiiiiiiiiiiiiiiiiiiiiiiiiiiiiiiiiiiiiiiiiiiiiiiiiiiiiiiiiiiiiiiiiiiiiiiiiiiiiiiiiiiiiiiiiiiiiiiiiiiiiiiiiiiiiiiiiiiiiiiiiiiiiiiiiiiiiiiiiiiiiiiiiiiiiiiiiiii iiiiiiiiiiiiiiiiiiiiiiiiiiiiiiiiiiiiiiiiiiiiiiiiiiiiiiiiiiiiiiiiiiiiiiiiiiiiiiiiiiiiiiiiiiiiiiiiiiiiiiiiiiiiiiiiiiiiiiiiiiiiiiiiiiiiiiiiiiiiiiiiiiiiiiiiiiiiiiiiiiiiiiiiiiiiiiiiiiiiiiiiiiiiiiiiiiiiiiiiiiiiiiiiiiiiiiiiiiiiiiiiiiiiiiiiiiiiiiiiiiiiiiiiiiiiiiiiiiiiiiiiiiiiiiiiiiiiiiiiiiiiiiiiiiiiiiiiiiiiiiiiiiiiiiiiiiiiiiiiiiiiiiiiiiiiiiiiiiiiiiiiiiiiiiiiiiiiiiiiiiiiiiiiiiiiiiiiiiiiiiiiiiiiiiiiiiiiiiiiiiiiiiiiiiiiiiiiiiiiiiiiiiiiiiiiiiiiiiiiiiiiiiiiiiiiiiiiiiiiiiiiiiiiiiiiiiiiiiiiiiiiiiiiiiiiiiiiiiiiiiiiiiiiiiiiiiiiiiiiiiiiiiiiiiiiiiiiiiiiiiiiiiiiiiiiiiiiiiiiiiiiiiiiiiiiiiiiiiiiiiiiiiiiiiiiiiiiiiiiiiiiiiiiiiiiiiiiiiiiiiiiiiiiiiiiiiiiiiiiiiiiiiiiiiiiiiiiiiiiiiiiiiiiiiiiiiiiiiiiiiiiiiiiiiiiiiiiiiiiiiiiiiiiiiiiiiiiiiiiiiiiiiiiiiiiiiiiiiiiiiiiiiiiiiiiiiiiiiiiiiiiiiiiiiiiiiiiiiiiiiiiiiiiiiiiiiiiiiiiiiiiiiiiiiiiiiiiiiiiiiiiiiiiiiiiiiiiiiiiiiiiiiiiiiiiiiiiiiiiiiiiiiiiiiiiiiiiiiiiiiiiiiiiiiiiiiiiiiiiiiiiiiiiiiiiiiiiiiiiiiiiiiiiiiiiiiiiiiiiiiiiiiiiiiiiiiiiiiiiiiiiiiiiiiiiiiiiiiiiiiiiiiiiiiiiiiiiiiiiiiiiiiiiiiiiiiiiiiiiiiiiiiiiiiiiiiiiiiiiiiiiiiiiiiiiiiiiiiiiiiiiiiiiiiiiiiiiiiiiiiiiiiiiiiiiiiiiiiiiiiiiiiiiiiiiiiiiiiiiiiiiiiiiiiiiiiiiiiiiiiiiiiiiiiiiiiiiiiiiiiiiiiiiiiiiiiiiiiiiiiiiiiiiiiiiiiiiiiiiiiiiiiiiiiiiiiiiiiiiiiiiiiiiiiiiiiiiiiiiiiiiiiiiiiiiiiiiiiiiiiiiiiiiiiiiiiiiiiiiiiiiiiiiiiiiiiiiiiiiiiiiiiiiiiiiiiiiiiiiiiiiiiiiiiiiiiiiiiiiiiiiiiiiiiiiiiiiiiiiiiiiiiiiiiiiiiiiiiiiiiiiiiiiiiiiiiiiiiiiiiiiiiiiiiiiiiiiiiiiiiiiiiiiiiiiiiiiiiiiiiiiiiiiiiiiiiiiiiiiiiiiiiiiiiiiiiiiiiiiiiiiiiiiiiiiiiiiiiiiiiiiiiiiiiiiiiiiiiiiiiiiiiiiiiiiiiiiiiiiiiiiiiiiiiiiiiiiiiiiiiiiiiiiiiiiiiiiiiiiiiiiiiiiiiiiiiiiiiiiiiiiiiiiiiiiiiiiiiiiiiiiiiiiiiiiiiiiiiiiiiiiiiiiiiiiiiiiiiiiiiiiiiiiiiiiiiiiiiiiiiiiiiiiiiiiiiiiiiiiiiiiiiiiiiiiiiiiiiiiiiiiiiiiiiiiiiiiiiiiiiiiiiiiiiiiiiiiiiiiiiiiiiiiiiiiiiiiiiiiiiiiiiiiiiiiiiiiiiiiiiiiiiiiiiiiiiiiiiiiiiiiiiiiiiiiiiiiiiiiiiiiiiiiiiiiiiiiiiiiiiiiiiiiiiiiiiiiiiiiiiiiiiiiiiiiiiiiiiiiiiiiiiiiiiiiiiiiiiiiiiiiiiiiiiiiiiiiiiiiiiiiiiiiii iiiiiiiiiiiiiiiiiiiiiiiiiiiiiiiiiiiiiiiiiiiiiiiiiiiiiiiiiiiiiiiiiiiiiiiiiiiiiiiiiiiiiiiiiiiiiiiiiiiiiiiiiiiiiiiiiiiiiiiiiiiiiiiiiiiiiiiiiiiiiiiiiiiiiiiiiiiiiiiiiiiiiiiiiiiiiiiiiiiiiiiiiiiiiiiiiiiiiiiiiiiiiiiiiiiiiiiiiiiiiiiiiiiiiiiiiiiiiiiiiiiiiiiiiiiiiiiiiiiiiiiiiiiiiiiiiiiiiiiiiiiiiiiiiiiiiiiiiiiiiiiiiiiiiiiiiiiiiiiiiiiiiiiiiiiiiiiiiiiiiiiiiiiiiiiiiiiiiiiiiiiiiiiiiiiiiiiiiiiiiiiiiiiiiiiiiiiiiiiiiiiiiiiiiiiiiiiiiiiiiiiiiiiiiiiiiiiiiiiiiiiiiiiiiiiiiiiiiiiiiiiiiiiiiiiiiiiiiiiiiiiiiiiiiiiiiiiiiiiiiiiiiiiiiiiiiiiiiiiiiiiiiiiiiiiiiiiiiiiiiiiiiiiiiiiiiiiiiiiiiiiiiiiiiiiiiiiiiiiiiiiiiiiiiiiiiiiiiiiiiiiiiiiiiiiiiiiiiiiiiiiiiiiiiiiiiiiiiiiiiiiiiiiiiiiiiiiiiiiiiiiiiiiiiiiiiiiiiiiiiiiiiiiiiiiiiiiiiiiiiiiiiiiiiiiiiiiiiiiiiiiiiiiiiiiiiiiiiiiiiiiiiiiiiiiiiiiiiiiiiiiiiiiiiiiiiiiiiiiiiiiiiiiiiiiiiiiiiiiiiiiiiiiiiiiiiiiiiiiiiiiiiiiiiiiiiiiiiiiiiiiiiiiiiiiiiiiiiiiiiiiiiiiiiiiiiiiiiiiiiiiiiiiiiiiiiiiiiiiiiiiiiiiiiiiiiiiiiiiiiiiiiiiiiiiiiiiiiiiiiiiiiiiiiiiiiiiiiiiiiiiiiiiiiiiiiiiiiiiiiiiiiiiiiiiiiiiiiiiiiiiiiiiiiiiiiiiiiiiiiiiiiiiiiiiiiiiiiiiiiiiiiiiiiiiiiiiiiiiiiiiiiiiiiiiiiiiiiiiiiiiiiiiiiiiiiiiiiiiiiiiiiiiiiiiiiiiiiiiiiiiiiiiiiiiiiiiiiiiiiiiiiiiiiiiiiiiiiiiiiiiiiiiiiiiiiiiiiiiiiiiiiiiiiiiiiiiiiiiiiiiiiiiiiiiiiiiiiiiiiiiiiiiiiiiiiiiiiiiiiiiiiiiiiiiiiiiiiiiiiiiiiiiiiiiiiiiiiiiiiiiiiiiiiiiiiiiiiiiiiiiiiiiiiiiiiiiiiiiiiiiiiiiiiiiiiiiiiiiiiiiiiiiiiiiiiiiiiiiiiiiiiiiiiiiiiiiiiiiiiiiiiiiiiiiiiiiiiiiiiiiiiiiiiiiiiiiiiiiiiiiiiiiiiiiiiiiiiiiiiiiiiiiiiiiiiiiiiiiiiiiiiiiiiiiiiiiiiiiiiiiiiiiiiiiiiiiiiiiiiiiiiiiiiiiiiiiiiiiiiiiiiiiiiiiiiiiiiiiiiiiiiiiiiiiiiiiiiiiiiiiiiiiiiiiiiiiiiiiiiiiiiiiiiiiiiiiiiiiiiiiiiiiiiiiiiiiiiiiiiiiiiiiiiiiiiiiiiiiiiiiiiiiiiiiiiiiiiiiiiiiiiiiiiiiiiiiiiiiiiiiiiiiiiiiiiiiiiiiiiiiiiiiiiiiiiiiiiiiiiiiiiiiiiiiiiiiiiiiiiiiiiiiiiiiiiiiiiiiiiiiiiiiiiiiiiiiiiiiiiiiiiiiiiiiiiiiiiiiiiiiiiiiiiiiiiiiiiiiiiiiiiiiiiiiiiiiiiiiiiiiiiiiiiiiiiiiiiiiiiiiiiiiiiiiiiiiiiiiiiiiiiiiiiiiiiiiiiiiiiiiiiiiiiiiiiiiiiiiiiiiiiiiiiiiiiiiiiiiiiiiiiiiiii iiiiiiiiiiiiiiiiiiiiiiiiiiiiiiiiiiiiiiiiiiiiiiiiiiiiiiiiiiiiiiiiiiiiiiiiiiiiiiiiiiiiiiiiiiiiiiiiiiiiiiiiiiiiiiiiiiiiiiiiiiiiiiiiiiiiiiiiiiiiiiiiiiiiiiiiiiiiiiiiiiiiiiiiiiiiiiiiiiiiiiiiiiiiiiiiiiiiiiiiiiiiiiiiiiiiiiiiiiiiiiiiiiiiiiiiiiiiiiiiiiiiiiiiiiiiiiiiiiiiiiiiiiiiiiiiiiiiiiiiiiiiiiiiiiiiiiiiiiiiiiiiiiiiiiiiiiiiiiiiiiiiiiiiiiiiiiiiiiiiiiiiiiiiiiiiiiiiiiiiiiiiiiiiiiiiiiiiiiiiiiiiiiiiiiiiiiiiiiiiiiiiiiiiiiiiiiiiiiiiiiiiiiiiiiiiiiiiiiiiiiiiiiiiiiiiiiiiiiiiiiiiiiiiiiiiiiiiiiiiiiiiiiiiiiiiiiiiiiiiiiiiiiiiiiiiiiiiiiiiiiiiiiiiiiiiiiiiiiiiiiiiiiiiiiiiiiiiiiiiiiiiiiiiiiiiiiiiiiiiiiiiiiiiiiiiiiiiiiiiiiiiiiiiiiiiiiiiiiiiiiiiiiiiiiiiiiiiiiiiiiiiiiiiiiiiiiiiiiiiiiiiiiiiiiiiiiiiiiiiiiiiiiiiiiiiiiiiiiiiiiiiiiiiiiiiiiiiiiiiiiiiiiiiiiiiiiiiiiiiiiiiiiiiiiiiiiiiiiiiiiiiiiiiiiiiiiiiiiiiiiiiiiiiiiiiiiiiiiiiiiiiiiiiiiiiiiiiiiiiiiiiiiiiiiiiiiiiiiiiiiiiiiiiiiiiiiiiiiiiiiiiiiiiiiiiiiiiiiiiiiiiiiiiiiiiiiiiiiiiiiiiiiiiiiiiiiiiiiiiiiiiiiiiiiiiiiiiiiiiiiiiiiiiiiiiiiiiiiiiiiiiiiiiiiiiiiiiiiiiiiiiiiiiiiiiiiiiiiiiiiiiiiiiiiiiiiiiiiiiiiiiiiiiiiiiiiiiiiiiiiiiiiiiiiiiiiiiiiiiiiiiiiiiiiiiiiiiiiiiiiiiiiiiiiiiiiiiiiiiiiiiiiiiiiiiiiiiiiiiiiiiiiiiiiiiiiiiiiiiiiiiiiiiiiiiiiiiiiiiiiiiiiiiiiiiiiiiiiiiiiiiiiiiiiiiiiiiiiiiiiiiiiiiiiiiiiiiiiiiiiiiiiiiiiiiiiiiiiiiiiiiiiiiiiiiiiiiiiiiiiiiiiiiiiiiiiiiiiiiiiiiiiiiiiiiiiiiiiiiiiiiiiiiiiiiiiiiiiiiiiiiiiiiiiiiiiiiiiiiiiiiiiiiiiiiiiiiiiiiiiiiiiiiiiiiiiiiiiiiiiiiiiiiiiiiiiiiiiiiiiiiiiiiiiiiiiiiiiiiiiiiiiiiiiiiiiiiiiiiiiiiiiiiiiiiiiiiiiiiiiiiiiiiiiiiiiiiiiiiiiiiiiiiiiiiiiiiiiiiiiiiiiiiiiiiiiiiiiiiiiiiiiiiiiiiiiiiiiiiiiiiiiiiiiiiiiiiiiiiiiiiiiiiiiiiiiiiiiiiiiiiiiiiiiiiiiiiiiiiiiiiiiiiiiiiiiiiiiiiiiiiiiiiiiiiiiiiiiiiiiiiiiiiiiiiiiiiiiiiiiiiiiiiiiiiiiiiiiiiiiiiiiiiiiiiiiiiiiiiiiiiiiiiiiiiiiiiiiiiiiiiiiiiiiiiiiiiiiiiiiiiiiiiiiiiiiiiiiiiiiiiiiiiiiiiiiiiiiiiiiiiiiiiiiiiiiiiiiiiiiiiiiiiiiiiiiiiiiiiiiiiiiiiiiiiiiiiiiiiiiiiiiiiiiiiiiiiiiiiiiiiiiiiiiiiiiiiiiiiiiiiiiiiiiiiiiiiiiiiiiiiiiiiiiiiiiiiiiiiiiiiiiiiiiiiiiiiiiiiiiiiiiiiii iiiiiiiiiiiiiiiiiiiiiiiiiiiiiiiiiiiiiiiiiiiiiiiiiiiiiiiiiiiiiiiiiiiiiiiiiiiiiiiiiiiiiiiiiiiiiiiiiiiiiiiiiiiiiiiiiiiiiiiiiiiiiiiiiiiiiiiiiiiiiiiiiiiiiiiiiiiiiiiiiiiiiiiiiiiiiiiiiiiiiiiiiiiiiiiiiiiiiiiiiiiiiiiiiiiiiiiiiiiiiiiiiiiiiiiiiiiiiiiiiiiiiiiiiiiiiiiiiiiiiiiiiiiiiiiiiiiiiiiiiiiiiiiiiiiiiiiiiiiiiiiiiiiiiiiiiiiiiiiiiiiiiiiiiiiiiiiiiiiiiiiiiiiiiiiiiiiiiiiiiiiiiiiiiiiiiiiiiiiiiiiiiiiiiiiiiiiiiiiiiiiiiiiiiiiiiiiiiiiiiiiiiiiiiiiiiiiiiiiiiiiiiiiiiiiiiiiiiiiiiiiiiiiiiiiiiiiiiiiiiiiiiiiiiiiiiiiiiiiiiiiiiiiiiiiiiiiiiiiiiiiiiiiiiiiiiiiiiiiiiiiiiiiiiiiiiiiiiiiiiiiiiiiiiiiiiiiiiiiiiiiiiiiiiiiiiiiiiiiiiiiiiiiiiiiiiiiiiiiiiiiiiiiiiiiiiiiiiiiiiiiiiiiiiiiiiiiiiiiiiiiiiiiiiiiiiiiiiiiiiiiiiiiiiiiiiiiiiiiiiiiiiiiiiiiiiiiiiiiiiiiiiiiiiiiiiiiiiiiiiiiiiiiiiiiiiiiiiiiiiiiiiiiiiiiiiiiiiiiiiiiiiiiiiiiiiiiiiiiiiiiiiiiiiiiiiiiiiiiiiiiiiiiiiiiiiiiiiiiiiiiiiiiiiiiiiiiiiiiiiiiiiiiiiiiiiiiiiiiiiiiiiiiiiiiiiiiiiiiiiiiiiiiiiiiiiiiiiiiiiiiiiiiiiiiiiiiiiiiiiiiiiiiiiiiiiiiiiiiiiiiiiiiiiiiiiiiiiiiiiiiiiiiiiiiiiiiiiiiiiiiiiiiiiiiiiiiiiiiiiiiiiiiiiiiiiiiiiiiiiiiiiiiiiiiiiiiiiiiiiiiiiiiiiiiiiiiiiiiiiiiiiiiiiiiiiiiiiiiiiiiiiiiiiiiiiiiiiiiiiiiiiiiiiiiiiiiiiiiiiiiiiiiiiiiiiiiiiiiiiiiiiiiiiiiiiiiiiiiiiiiiiiiiiiiiiiiiiiiiiiiiiiiiiiiiiiiiiiiiiiiiiiiiiiiiiiiiiiiiiiiiiiiiiiiiiiiiiiiiiiiiiiiiiiiiiiiiiiiiiiiiiiiiiiiiiiiiiiiiiiiiiiiiiiiiiiiiiiiiiiiiiiiiiiiiiiiiiiiiiiiiiiiiiiiiiiiiiiiiiiiiiiiiiiiiiiiiiiiiiiiiiiiiiiiiiiiiiiiiiiiiiiiiiiiiiiiiiiiiiiiiiiiiiiiiiiiiiiiiiiiiiiiiiiiiiiiiiiiiiiiiiiiiiiiiiiiiiiiiiiiiiiiiiiiiiiiiiiiiiiiiiiiiiiiiiiiiiiiiiiiiiiiiiiiiiiiiiiiiiiiiiiiiiiiiiiiiiiiiiiiiiiiiiiiiiiiiiiiiiiiiiiiiiiiiiiiiiiiiiiiiiiiiiiiiiiiiiiiiiiiiiiiiiiiiiiiiiiiiiiiiiiiiiiiiiiiiiiiiiiiiiiiiiiiiiiiiiiiiiiiiiiiiiiiiiiiiiiiiiiiiiiiiiiiiiiiiiiiiiiiiiiiiiiiiiiiiiiiiiiiiiiiiiiiiiiiiiiiiiiiiiiiiiiiiiiiiiiiiiiiiiiiiiiiiiiiiiiiiiiiiiiiiiiiiiiiiiiiiiiiiiiiiiiiiiiiiiiiiiiiiiiiiiiiiiiiiiiiiiiiiiiiiiiiiiiiiiiiiiiiiiiiiiiiiiiiiiiiiiiiiiiiiiiiiiiiiiiiiiiiiiiiiiiiiiiiiiiiiiiiiiiiiii iiiiiiiiiiiiiiiiiiiiiiiiiiiiiiiiiiiiiiiiiiiiiiiiiiiiiiiiiiiiiiiiiiiiiiiiiiiiiiiiiiiiiiiiiiiiiiiiiiiiiiiiiiiiiiiiiiiiiiiiiiiiiiiiiiiiiiiiiiiiiiiiiiiiiiiiiiiiiiiiiiiiiiiiiiiiiiiiiiiiiiiiiiiiiiiiiiiiiiiiiiiiiiiiiiiiiiiiiiiiiiiiiiiiiiiiiiiiiiiiiiiiiiiiiiiiiiiiiiiiiiiiiiiiiiiiiiiiiiiiiiiiiiiiiiiiiiiiiiiiiiiiiiiiiiiiiiiiiiiiiiiiiiiiiiiiiiiiiiiiiiiiiiiiiiiiiiiiiiiiiiiiiiiiiiiiiiiiiiiiiiiiiiiiiiiiiiiiiiiiiiiiiiiiiiiiiiiiiiiiiiiiiiiiiiiiiiiiiiiiiiiiiiiiiiiiiiiiiiiiiiiiiiiiiiiiiiiiiiiiiiiiiiiiiiiiiiiiiiiiiiiiiiiiiiiiiiiiiiiiiiiiiiiiiiiiiiiiiiiiiiiiiiiiiiiiiiiiiiiiiiiiiiiiiiiiiiiiiiiiiiiiiiiiiiiiiiiiiiiiiiiiiiiiiiiiiiiiiiiiiiiiiiiiiiiiiiiiiiiiiiiiiiiiiiiiiiiiiiiiiiiiiiiiiiiiiiiiiiiiiiiiiiiiiiiiiiiiiiiiiiiiiiiiiiiiiiiiiiiiiiiiiiiiiiiiiiiiiiiiiiiiiiiiiiiiiiiiiiiiiiiiiiiiiiiiiiiiiiiiiiiiiiiiiiiiiiiiiiiiiiiiiiiiiiiiiiiiiiiiiiiiiiiiiiiiiiiiiiiiiiiiiiiiiiiiiiiiiiiiiiiiiiiiiiiiiiiiiiiiiiiiiiiiiiiiiiiiiiiiiiiiiiiiiiiiiiiiiiiiiiiiiiiiiiiiiiiiiiiiiiiiiiiiiiiiiiiiiiiiiiiiiiiiiiiiiiiiiiiiiiiiiiiiiiiiiiiiiiiiiiiiiiiiiiiiiiiiiiiiiiiiiiiiiiiiiiiiiiiiiiiiiiiiiiiiiiiiiiiiiiiiiiiiiiiiiiiiiiiiiiiiiiiiiiiiiiiiiiiiiiiiiiiiiiiiiiiiiiiiiiiiiiiiiiiiiiiiiiiiiiiiiiiiiiiiiiiiiiiiiiiiiiiiiiiiiiiiiiiiiiiiiiiiiiiiiiiiiiiiiiiiiiiiiiiiiiiiiiiiiiiiiiiiiiiiiiiiiiiiiiiiiiiiiiiiiiiiiiiiiiiiiiiiiiiiiiiiiiiiiiiiiiiiiiiiiiiiiiiiiiiiiiiiiiiiiiiiiiiiiiiiiiiiiiiiiiiiiiiiiiiiiiiiiiiiiiiiiiiiiiiiiiiiiiiiiiiiiiiiiiiiiiiiiiiiiiiiiiiiiiiiiiiiiiiiiiiiiiiiiiiiiiiiiiiiiiiiiiiiiiiiiiiiiiiiiiiiiiiiiiiiiiiiiiiiiiiiiiiiiiiiiiiiiiiiiiiiiiiiiiiiiiiiiiiiiiiiiiiiiiiiiiiiiiiiiiiiiiiiiiiiiiiiiiiiiiiiiiiiiiiiiiiiiiiiiiiiiiiiiiiiiiiiiiiiiiiiiiiiiiiiiiiiiiiiiiiiiiiiiiiiiiiiiiiiiiiiiiiiiiiiiiiiiiiiiiiiiiiiiiiiiiiiiiiiiiiiiiiiiiiiiiiiiiiiiiiiiiiiiiiiiiiiiiiiiiiiiiiiiiiiiiiiiiiiiiiiiiiiiiiiiiiiiiiiiiiiiiiiiiiiiiiiiiiiiiiiiiiiiiiiiiiiiiiiiiiiiiiiiiiiiiiiiiiiiiiiiiiiiiiiiiiiiiiiiiiiiiiiiiiiiiiiiiiiiiiiiiiiiiiiiiiiiiiiiiiiiiiiiiiiiiiiiiiiiiiiiiiiiiiiiiiiiiiiiiiiiiiiiiiiiiiiiiiiiiiiiiiiiiiiiiiiii iiiiiiiiiiiiiiiiiiiiiiiiiiiiiiiiiiiiiiiiiiiiiiiiiiiiiiiiiiiiiiiiiiiiiiiiiiiiiiiiiiiiiiiiiiiiiiiiiiiiiiiiiiiiiiiiiiiiiiiiiiiiiiiiiiiiiiiiiiiiiiiiiiiiiiiiiiiiiiiiiiiiiiiiiiiiiiiiiiiiiiiiiiiiiiiiiiiiiiiiiiiiiiiiiiiiiiiiiiiiiiiiiiiiiiiiiiiiiiiiiiiiiiiiiiiiiiiiiiiiiiiiiiiiiiiiiiiiiiiiiiiiiiiiiiiiiiiiiiiiiiiiiiiiiiiiiiiiiiiiiiiiiiiiiiiiiiiiiiiiiiiiiiiiiiiiiiiiiiiiiiiiiiiiiiiiiiiiiiiiiiiiiiiiiiiiiiiiiiiiiiiiiiiiiiiiiiiiiiiiiiiiiiiiiiiiiiiiiiiiiiiiiiiiiiiiiiiiiiiiiiiiiiiiiiiiiiiiiiiiiiiiiiiiiiiiiiiiiiiiiiiiiiiiiiiiiiiiiiiiiiiiiiiiiiiiiiiiiiiiiiiiiiiiiiiiiiiiiiiiiiiiiiiiiiiiiiiiiiiiiiiiiiiiiiiiiiiiiiiiiiiiiiiiiiiiiiiiiiiiiiiiiiiiiiiiiiiiiiiiiiiiiiiiiiiiiiiiiiiiiiiiiiiiiiiiiiiiiiiiiiiiiiiiiiiiiiiiiiiiiiiiiiiiiiiiiiiiiiiiiiiiiiiiiiiiiiiiiiiiiiiiiiiiiiiiiiiiiiiiiiiiiiiiiiiiiiiiiiiiiiiiiiiiiiiiiiiiiiiiiiiiiiiiiiiiiiiiiiiiiiiiiiiiiiiiiiiiiiiiiiiiiiiiiiiiiiiiiiiiiiiiiiiiiiiiiiiiiiiiiiiiiiiiiiiiiiiiiiiiiiiiiiiiiiiiiiiiiiiiiiiiiiiiiiiiiiiiiiiiiiiiiiiiiiiiiiiiiiiiiiiiiiiiiiiiiiiiiiiiiiiiiiiiiiiiiiiiiiiiiiiiiiiiiiiiiiiiiiiiiiiiiiiiiiiiiiiiiiiiiiiiiiiiiiiiiiiiiiiiiiiiiiiiiiiiiiiiiiiiiiiiiiiiiiiiiiiiiiiiiiiiiiiiiiiiiiiiiiiiiiiiiiiiiiiiiiiiiiiiiiiiiiiiiiiiiiiiiiiiiiiiiiiiiiiiiiiiiiiiiiiiiiiiiiiiiiiiiiiiiiiiiiiiiiiiiiiiiiiiiiiiiiiiiiiiiiiiiiiiiiiiiiiiiiiiiiiiiiiiiiiiiiiiiiiiiiiiiiiiiiiiiiiiiiiiiiiiiiiiiiiiiiiiiiiiiiiiiiiiiiiiiiiiiiiiiiiiiiiiiiiiiiiiiiiiiiiiiiiiiiiiiiiiiiiiiiiiiiiiiiiiiiiiiiiiiiiiiiiiiiiiiiiiiiiiiiiiiiiiiiiiiiiiiiiiiiiiiiiiiiiiiiiiiiiiiiiiiiiiiiiiiiiiiiiiiiiiiiiiiiiiiiiiiiiiiiiiiiiiiiiiiiiiiiiiiiiiiiiiiiiiiiiiiiiiiiiiiiiiiiiiiiiiiiiiiiiiiiiiiiiiiiiiiiiiiiiiiiiiiiiiiiiiiiiiiiiiiiiiiiiiiiiiiiiiiiiiiiiiiiiiiiiiiiiiiiiiiiiiiiiiiiiiiiiiiiiiiiiiiiiiiiiiiiiiiiiiiiiiiiiiiiiiiiiiiiiiiiiiiiiiiiiiiiiiiiiiiiiiiiiiiiiiiiiiiiiiiiiiiiiiiiiiiiiiiiiiiiiiiiiiiiiiiiiiiiiiiiiiiiiiiiiiiiiiiiiiiiiiiiiiiiiiiiiiiiiiiiiiiiiiiiiiiiiiiiiiiiiiiiiiiiiiiiiiiiiiiiiiiiiiiiiiiiiiiiiiiiiiiiiiiiiiiiiiiiiiiiiiiiiiiiiiiiiiiiiiiiiiiiiiiiiiiiiiiiiiiiiiiiiiiiiiii iiiiiiiiiiiiiiiiiiiiiiiiiiiiiiiiiiiiiiiiiiiiiiiiiiiiiiiiiiiiiiiiiiiiiiiiiiiiiiiiiiiiiiiiiiiiiiiiiiiiiiiiiiiiiiiiiiiiiiiiiiiiiiiiiiiiiiiiiiiiiiiiiiiiiiiiiiiiiiiiiiiiiiiiiiiiiiiiiiiiiiiiiiiiiiiiiiiiiiiiiiiiiiiiiiiiiiiiiiiiiiiiiiiiiiiiiiiiiiiiiiiiiiiiiiiiiiiiiiiiiiiiiiiiiiiiiiiiiiiiiiiiiiiiiiiiiiiiiiiiiiiiiiiiiiiiiiiiiiiiiiiiiiiiiiiiiiiiiiiiiiiiiiiiiiiiiiiiiiiiiiiiiiiiiiiiiiiiiiiiiiiiiiiiiiiiiiiiiiiiiiiiiiiiiiiiiiiiiiiiiiiiiiiiiiiiiiiiiiiiiiiiiiiiiiiiiiiiiiiiiiiiiiiiiiiiiiiiiiiiiiiiiiiiiiiiiiiiiiiiiiiiiiiiiiiiiiiiiiiiiiiiiiiiiiiiiiiiiiiiiiiiiiiiiiiiiiiiiiiiiiiiiiiiiiiiiiiiiiiiiiiiiiiiiiiiiiiiiiiiiiiiiiiiiiiiiiiiiiiiiiiiiiiiiiiiiiiiiiiiiiiiiiiiiiiiiiiiiiiiiiiiiiiiiiiiiiiiiiiiiiiiiiiiiiiiiiiiiiiiiiiiiiiiiiiiiiiiiiiiiiiiiiiiiiiiiiiiiiiiiiiiiiiiiiiiiiiiiiiiiiiiiiiiiiiiiiiiiiiiiiiiiiiiiiiiiiiiiiiiiiiiiiiiiiiiiiiiiiiiiiiiiiiiiiiiiiiiiiiiiiiiiiiiiiiiiiiiiiiiiiiiiiiiiiiiiiiiiiiiiiiiiiiiiiiiiiiiiiiiiiiiiiiiiiiiiiiiiiiiiiiiiiiiiiiiiiiiiiiiiiiiiiiiiiiiiiiiiiiiiiiiiiiiiiiiiiiiiiiiiiiiiiiiiiiiiiiiiiiiiiiiiiiiiiiiiiiiiiiiiiiiiiiiiiiiiiiiiiiiiiiiiiiiiiiiiiiiiiiiiiiiiiiiiiiiiiiiiiiiiiiiiiiiiiiiiiiiiiiiiiiiiiiiiiiiiiiiiiiiiiiiiiiiiiiiiiiiiiiiiiiiiiiiiiiiiiiiiiiiiiiiiiiiiiiiiiiiiiiiiiiiiiiiiiiiiiiiiiiiiiiiiiiiiiiiiiiiiiiiiiiiiiiiiiiiiiiiiiiiiiiiiiiiiiiiiiiiiiiiiiiiiiiiiiiiiiiiiiiiiiiiiiiiiiiiiiiiiiiiiiiiiiiiiiiiiiiiiiiiiiiiiiiiiiiiiiiiiiiiiiiiiiiiiiiiiiiiiiiiiiiiiiiiiiiiiiiiiiiiiiiiiiiiiiiiiiiiiiiiiiiiiiiiiiiiiiiiiiiiiiiiiiiiiiiiiiiiiiiiiiiiiiiiiiiiiiiiiiiiiiiiiiiiiiiiiiiiiiiiiiiiiiiiiiiiiiiiiiiiiiiiiiiiiiiiiiiiiiiiiiiiiiiiiiiiiiiiiiiiiiiiiiiiiiiiiiiiiiiiiiiiiiiiiiiiiiiiiiiiiiiiiiiiiiiiiiiiiiiiiiiiiiiiiiiiiiiiiiiiiiiiiiiiiiiiiiiiiiiiiiiiiiiiiiiiiiiiiiiiiiiiiiiiiiiiiiiiiiiiiiiiiiiiiiiiiiiiiiiiiiiiiiiiiiiiiiiiiiiiiiiiiiiiiiiiiiiiiiiiiiiiiiiiiiiiiiiiiiiiiiiiiiiiiiiiiiiiiiiiiiiiiiiiiiiiiiiiiiiiiiiiiiiiiiiiiiiiiiiiiiiiiiiiiiiiiiiiiiiiiiiiiiiiiiiiiiiiiiiiiiiiiiiiiiiiiiiiiiiiiiiiiiiiiiiiiiiiiiiiiiiiiiiiiiiiiiiiiiiiiiiiiiiiiiiiiiiiiiiiiiiiiiiiii iiiiiiiiiiiiiiiiiiiiiiiiiiiiiiiiiiiiiiiiiiiiiiiiiiiiiiiiiiiiiiiiiiiiiiiiiiiiiiiiiiiiiiiiiiiiiiiiiiiiiiiiiiiiiiiiiiiiiiiiiiiiiiiiiiiiiiiiiiiiiiiiiiiiiiiiiiiiiiiiiiiiiiiiiiiiiiiiiiiiiiiiiiiiiiiiiiiiiiiiiiiiiiiiiiiiiiiiiiiiiiiiiiiiiiiiiiiiiiiiiiiiiiiiiiiiiiiiiiiiiiiiiiiiiiiiiiiiiiiiiiiiiiiiiiiiiiiiiiiiiiiiiiiiiiiiiiiiiiiiiiiiiiiiiiiiiiiiiiiiiiiiiiiiiiiiiiiiiiiiiiiiiiiiiiiiiiiiiiiiiiiiiiiiiiiiiiiiiiiiiiiiiiiiiiiiiiiiiiiiiiiiiiiiiiiiiiiiiiiiiiiiiiiiiiiiiiiiiiiiiiiiiiiiiiiiiiiiiiiiiiiiiiiiiiiiiiiiiiiiiiiiiiiiiiiiiiiiiiiiiiiiiiiiiiiiiiiiiiiiiiiiiiiiiiiiiiiiiiiiiiiiiiiiiiiiiiiiiiiiiiiiiiiiiiiiiiiiiiiiiiiiiiiiiiiiiiiiiiiiiiiiiiiiiiiiiiiiiiiiiiiiiiiiiiiiiiiiiiiiiiiiiiiiiiiiiiiiiiiiiiiiiiiiiiiiiiiiiiiiiiiiiiiiiiiiiiiiiiiiiiiiiiiiiiiiiiiiiiiiiiiiiiiiiiiiiiiiiiiiiiiiiiiiiiiiiiiiiiiiiiiiiiiiiiiiiiiiiiiiiiiiiiiiiiiiiiiiiiiiiiiiiiiiiiiiiiiiiiiiiiiiiiiiiiiiiiiiiiiiiiiiiiiiiiiiiiiiiiiiiiiiiiiiiiiiiiiiiiiiiiiiiiiiiiiiiiiiiiiiiiiiiiiiiiiiiiiiiiiiiiiiiiiiiiiiiiiiiiiiiiiiiiiiiiiiiiiiiiiiiiiiiiiiiiiiiiiiiiiiiiiiiiiiiiiiiiiiiiiiiiiiiiiiiiiiiiiiiiiiiiiiiiiiiiiiiiiiiiiiiiiiiiiiiiiiiiiiiiiiiiiiiiiiiiiiiiiiiiiiiiiiiiiiiiiiiiiiiiiiiiiiiiiiiiiiiiiiiiiiiiiiiiiiiiiiiiiiiiiiiiiiiiiiiiiiiiiiiiiiiiiiiiiiiiiiiiiiiiiiiiiiiiiiiiiiiiiiiiiiiiiiiiiiiiiiiiiiiiiiiiiiiiiiiiiiiiiiiiiiiiiiiiiiiiiiiiiiiiiiiiiiiiiiiiiiiiiiiiiiiiiiiiiiiiiiiiiiiiiiiiiiiiiiiiiiiiiiiiiiiiiiiiiiiiiiiiiiiiiiiiiiiiiiiiiiiiiiiiiiiiiiiiiiiiiiiiiiiiiiiiiiiiiiiiiiiiiiiiiiiiiiiiiiiiiiiiiiiiiiiiiiiiiiiiiiiiiiiiiiiiiiiiiiiiiiiiiiiiiiiiiiiiiiiiiiiiiiiiiiiiiiiiiiiiiiiiiiiiiiiiiiiiiiiiiiiiiiiiiiiiiiiiiiiiiiiiiiiiiiiiiiiiiiiiiiiiiiiiiiiiiiiiiiiiiiiiiiiiiiiiiiiiiiiiiiiiiiiiiiiiiiiiiiiiiiiiiiiiiiiiiiiiiiiiiiiiiiiiiiiiiiiiiiiiiiiiiiiiiiiiiiiiiiiiiiiiiiiiiiiiiiiiiiiiiiiiiiiiiiiiiiiiiiiiiiiiiiiiiiiiiiiiiiiiiiiiiiiiiiiiiiiiiiiiiiiiiiiiiiiiiiiiiiiiiiiiiiiiiiiiiiiiiiiiiiiiiiiiiiiiiiiiiiiiiiiiiiiiiiiiiiiiiiiiiiiiiiiiiiiiiiiiiiiiiiiiiiiiiiiiiiiiiiiiiiiiiiiiiiiiiiiiiiiiiiiiiiiiiiiiiiiiiiiiiiiiiiiiiiiiiiiiiiii iiiiiiiiiiiiiiiiiiiiiiiiiiiiiiiiiiiiiiiiiiiiiiiiiiiiiiiiiiiiiiiiiiiiiiiiiiiiiiiiiiiiiiiiiiiiiiiiiiiiiiiiiiiiiiiiiiiiiiiiiiiiiiiiiiiiiiiiiiiiiiiiiiiiiiiiiiiiiiiiiiiiiiiiiiiiiiiiiiiiiiiiiiiiiiiiiiiiiiiiiiiiiiiiiiiiiiiiiiiiiiiiiiiiiiiiiiiiiiiiiiiiiiiiiiiiiiiiiiiiiiiiiiiiiiiiiiiiiiiiiiiiiiiiiiiiiiiiiiiiiiiiiiiiiiiiiiiiiiiiiiiiiiiiiiiiiiiiiiiiiiiiiiiiiiiiiiiiiiiiiiiiiiiiiiiiiiiiiiiiiiiiiiiiiiiiiiiiiiiiiiiiiiiiiiiiiiiiiiiiiiiiiiiiiiiiiiiiiiiiiiiiiiiiiiiiiiiiiiiiiiiiiiiiiiiiiiiiiiiiiiiiiiiiiiiiiiiiiiiiiiiiiiiiiiiiiiiiiiiiiiiiiiiiiiiiiiiiiiiiiiiiiiiiiiiiiiiiiiiiiiiiiiiiiiiiiiiiiiiiiiiiiiiiiiiiiiiiiiiiiiiiiiiiiiiiiiiiiiiiiiiiiiiiiiiiiiiiiiiiiiiiiiiiiiiiiiiiiiiiiiiiiiiiiiiiiiiiiiiiiiiiiiiiiiiiiiiiiiiiiiiiiiiiiiiiiiiiiiiiiiiiiiiiiiiiiiiiiiiiiiiiiiiiiiiiiiiiiiiiiiiiiiiiiiiiiiiiiiiiiiiiiiiiiiiiiiiiiiiiiiiiiiiiiiiiiiiiiiiiiiiiiiiiiiiiiiiiiiiiiiiiiiiiiiiiiiiiiiiiiiiiiiiiiiiiiiiiiiiiiiiiiiiiiiiiiiiiiiiiiiiiiiiiiiiiiiiiiiiiiiiiiiiiiiiiiiiiiiiiiiiiiiiiiiiiiiiiiiiiiiiiiiiiiiiiiiiiiiiiiiiiiiiiiiiiiiiiiiiiiiiiiiiiiiiiiiiiiiiiiiiiiiiiiiiiiiiiiiiiiiiiiiiiiiiiiiiiiiiiiiiiiiiiiiiiiiiiiiiiiiiiiiiiiiiiiiiiiiiiiiiiiiiiiiiiiiiiiiiiiiiiiiiiiiiiiiiiiiiiiiiiiiiiiiiiiiiiiiiiiiiiiiiiiiiiiiiiiiiiiiiiiiiiiiiiiiiiiiiiiiiiiiiiiiiiiiiiiiiiiiiiiiiiiiiiiiiiiiiiiiiiiiiiiiiiiiiiiiiiiiiiiiiiiiiiiiiiiiiiiiiiiiiiiiiiiiiiiiiiiiiiiiiiiiiiiiiiiiiiiiiiiiiiiiiiiiiiiiiiiiiiiiiiiiiiiiiiiiiiiiiiiiiiiiiiiiiiiiiiiiiiiiiiiiiiiiiiiiiiiiiiiiiiiiiiiiiiiiiiiiiiiiiiiiiiiiiiiiiiiiiiiiiiiiiiiiiiiiiiiiiiiiiiiiiiiiiiiiiiiiiiiiiiiiiiiiiiiiiiiiiiiiiiiiiiiiiiiiiiiiiiiiiiiiiiiiiiiiiiiiiiiiiiiiiiiiiiiiiiiiiiiiiiiiiiiiiiiiiiiiiiiiiiiiiiiiiiiiiiiiiiiiiiiiiiiiiiiiiiiiiiiiiiiiiiiiiiiiiiiiiiiiiiiiiiiiiiiiiiiiiiiiiiiiiiiiiiiiiiiiiiiiiiiiiiiiiiiiiiiiiiiiiiiiiiiiiiiiiiiiiiiiiiiiiiiiiiiiiiiiiiiiiiiiiiiiiiiiiiiiiiiiiiiiiiiiiiiiiiiiiiiiiiiiiiiiiiiiiiiiiiiiiiiiiiiiiiiiiiiiiiiiiiiiiiiiiiiiiiiiiiiiiiiiiiiiiiiiiiiiiiiiiiiiiiiiiiiiiiiiiiiiiiiiiiiiiiiiiiiiiiiiiiiiiiiiiiiiiiiiiiiiiiiiiiiiiiiiiiiiiiiiiiii iiiiiiiiiiiiiiiiiiiiiiiiiiiiiiiiiiiiiiiiiiiiiiiiiiiiiiiiiiiiiiiiiiiiiiiiiiiiiiiiiiiiiiiiiiiiiiiiiiiiiiiiiiiiiiiiiiiiiiiiiiiiiiiiiiiiiiiiiiiiiiiiiiiiiiiiiiiiiiiiiiiiiiiiiiiiiiiiiiiiiiiiiiiiiiiiiiiiiiiiiiiiiiiiiiiiiiiiiiiiiiiiiiiiiiiiiiiiiiiiiiiiiiiiiiiiiiiiiiiiiiiiiiiiiiiiiiiiiiiiiiiiiiiiiiiiiiiiiiiiiiiiiiiiiiiiiiiiiiiiiiiiiiiiiiiiiiiiiiiiiiiiiiiiiiiiiiiiiiiiiiiiiiiiiiiiiiiiiiiiiiiiiiiiiiiiiiiiiiiiiiiiiiiiiiiiiiiiiiiiiiiiiiiiiiiiiiiiiiiiiiiiiiiiiiiiiiiiiiiiiiiiiiiiiiiiiiiiiiiiiiiiiiiiiiiiiiiiiiiiiiiiiiiiiiiiiiiiiiiiiiiiiiiiiiiiiiiiiiiiiiiiiiiiiiiiiiiiiiiiiiiiiiiiiiiiiiiiiiiiiiiiiiiiiiiiiiiiiiiiiiiiiiiiiiiiiiiiiiiiiiiiiiiiiiiiiiiiiiiiiiiiiiiiiiiiiiiiiiiiiiiiiiiiiiiiiiiiiiiiiiiiiiiiiiiiiiiiiiiiiiiiiiiiiiiiiiiiiiiiiiiiiiiiiiiiiiiiiiiiiiiiiiiiiiiiiiiiiiiiiiiiiiiiiiiiiiiiiiiiiiiiiiiiiiiiiiiiiiiiiiiiiiiiiiiiiiiiiiiiiiiiiiiiiiiiiiiiiiiiiiiiiiiiiiiiiiiiiiiiiiiiiiiiiiiiiiiiiiiiiiiiiiiiiiiiiiiiiiiiiiiiiiiiiiiiiiiiiiiiiiiiiiiiiiiiiiiiiiiiiiiiiiiiiiiiiiiiiiiiiiiiiiiiiiiiiiiiiiiiiiiiiiiiiiiiiiiiiiiiiiiiiiiiiiiiiiiiiiiiiiiiiiiiiiiiiiiiiiiiiiiiiiiiiiiiiiiiiiiiiiiiiiiiiiiiiiiiiiiiiiiiiiiiiiiiiiiiiiiiiiiiiiiiiiiiiiiiiiiiiiiiiiiiiiiiiiiiiiiiiiiiiiiiiiiiiiiiiiiiiiiiiiiiiiiiiiiiiiiiiiiiiiiiiiiiiiiiiiiiiiiiiiiiiiiiiiiiiiiiiiiiiiiiiiiiiiiiiiiiiiiiiiiiiiiiiiiiiiiiiiiiiiiiiiiiiiiiiiiiiiiiiiiiiiiiiiiiiiiiiiiiiiiiiiiiiiiiiiiiiiiiiiiiiiiiiiiiiiiiiiiiiiiiiiiiiiiiiiiiiiiiiiiiiiiiiiiiiiiiiiiiiiiiiiiiiiiiiiiiiiiiiiiiiiiiiiiiiiiiiiiiiiiiiiiiiiiiiiiiiiiiiiiiiiiiiiiiiiiiiiiiiiiiiiiiiiiiiiiiiiiiiiiiiiiiiiiiiiiiiiiiiiiiiiiiiiiiiiiiiiiiiiiiiiiiiiiiiiiiiiiiiiiiiiiiiiiiiiiiiiiiiiiiiiiiiiiiiiiiiiiiiiiiiiiiiiiiiiiiiiiiiiiiiiiiiiiiiiiiiiiiiiiiiiiiiiiiiiiiiiiiiiiiiiiiiiiiiiiiiiiiiiiiiiiiiiiiiiiiiiiiiiiiiiiiiiiiiiiiiiiiiiiiiiiiiiiiiiiiiiiiiiiiiiiiiiiiiiiiiiiiiiiiiiiiiiiiiiiiiiiiiiiiiiiiiiiiiiiiiiiiiiiiiiiiiiiiiiiiiiiiiiiiiiiiiiiiiiiiiiiiiiiiiiiiiiiiiiiiiiiiiiiiiiiiiiiiiiiiiiiiiiiiiiiiiiiiiiiiiiiiiiiiiiiiiiiiiiiiiiiiiiiiiiiiiiiiiiiiiiiiiiiiiiiiiiiiiiiiiiiiiiiiii iiiiiiiiiiiiiiiiiiiiiiiiiiiiiiiiiiiiiiiiiiiiiiiiiiiiiiiiiiiiiiiiiiiiiiiiiiiiiiiiiiiiiiiiiiiiiiiiiiiiiiiiiiiiiiiiiiiiiiiiiiiiiiiiiiiiiiiiiiiiiiiiiiiiiiiiiiiiiiiiiiiiiiiiiiiiiiiiiiiiiiiiiiiiiiiiiiiiiiiiiiiiiiiiiiiiiiiiiiiiiiiiiiiiiiiiiiiiiiiiiiiiiiiiiiiiiiiiiiiiiiiiiiiiiiiiiiiiiiiiiiiiiiiiiiiiiiiiiiiiiiiiiiiiiiiiiiiiiiiiiiiiiiiiiiiiiiiiiiiiiiiiiiiiiiiiiiiiiiiiiiiiiiiiiiiiiiiiiiiiiiiiiiiiiiiiiiiiiiiiiiiiiiiiiiiiiiiiiiiiiiiiiiiiiiiiiiiiiiiiiiiiiiiiiiiiiiiiiiiiiiiiiiiiiiiiiiiiiiiiiiiiiiiiiiiiiiiiiiiiiiiiiiiiiiiiiiiiiiiiiiiiiiiiiiiiiiiiiiiiiiiiiiiiiiiiiiiiiiiiiiiiiiiiiiiiiiiiiiiiiiiiiiiiiiiiiiiiiiiiiiiiiiiiiiiiiiiiiiiiiiiiiiiiiiiiiiiiiiiiiiiiiiiiiiiiiiiiiiiiiiiiiiiiiiiiiiiiiiiiiiiiiiiiiiiiiiiiiiiiiiiiiiiiiiiiiiiiiiiiiiiiiiiiiiiiiiiiiiiiiiiiiiiiiiiiiiiiiiiiiiiiiiiiiiiiiiiiiiiiiiiiiiiiiiiiiiiiiiiiiiiiiiiiiiiiiiiiiiiiiiiiiiiiiiiiiiiiiiiiiiiiiiiiiiiiiiiiiiiiiiiiiiiiiiiiiiiiiiiiiiiiiiiiiiiiiiiiiiiiiiiiiiiiiiiiiiiiiiiiiiiiiiiiiiiiiiiiiiiiiiiiiiiiiiiiiiiiiiiiiiiiiiiiiiiiiiiiiiiiiiiiiiiiiiiiiiiiiiiiiiiiiiiiiiiiiiiiiiiiiiiiiiiiiiiiiiiiiiiiiiiiiiiiiiiiiiiiiiiiiiiiiiiiiiiiiiiiiiiiiiiiiiiiiiiiiiiiiiiiiiiiiiiiiiiiiiiiiiiiiiiiiiiiiiiiiiiiiiiiiiiiiiiiiiiiiiiiiiiiiiiiiiiiiiiiiiiiiiiiiiiiiiiiiiiiiiiiiiiiiiiiiiiiiiiiiiiiiiiiiiiiiiiiiiiiiiiiiiiiiiiiiiiiiiiiiiiiiiiiiiiiiiiiiiiiiiiiiiiiiiiiiiiiiiiiiiiiiiiiiiiiiiiiiiiiiiiiiiiiiiiiiiiiiiiiiiiiiiiiiiiiiiiiiiiiiiiiiiiiiiiiiiiiiiiiiiiiiiiiiiiiiiiiiiiiiiiiiiiiiiiiiiiiiiiiiiiiiiiiiiiiiiiiiiiiiiiiiiiiiiiiiiiiiiiiiiiiiiiiiiiiiiiiiiiiiiiiiiiiiiiiiiiiiiiiiiiiiiiiiiiiiiiiiiiiiiiiiiiiiiiiiiiiiiiiiiiiiiiiiiiiiiiiiiiiiiiiiiiiiiiiiiiiiiiiiiiiiiiiiiiiiiiiiiiiiiiiiiiiiiiiiiiiiiiiiiiiiiiiiiiiiiiiiiiiiiiiiiiiiiiiiiiiiiiiiiiiiiiiiiiiiiiiiiiiiiiiiiiiiiiiiiiiiiiiiiiiiiiiiiiiiiiiiiiiiiiiiiiiiiiiiiiiiiiiiiiiiiiiiiiiiiiiiiiiiiiiiiiiiiiiiiiiiiiiiiiiiiiiiiiiiiiiiiiiiiiiiiiiiiiiiiiiiiiiiiiiiiiiiiiiiiiiiiiiiiiiiiiiiiiiiiiiiiiiiiiiiiiiiiiiiiiiiiiiiiiiiiiiiiiiiiiiiiiiiiiiiiiiiiiiiiiiiiiiiiiiiiiiiiiiiiiiiiiiiiiiiiiiiiiiiiiiii iiiiiiiiiiiiiiiiiiiiiiiiiiiiiiiiiiiiiiiiiiiiiiiiiiiiiiiiiiiiiiiiiiiiiiiiiiiiiiiiiiiiiiiiiiiiiiiiiiiiiiiiiiiiiiiiiiiiiiiiiiiiiiiiiiiiiiiiiiiiiiiiiiiiiiiiiiiiiiiiiiiiiiiiiiiiiiiiiiiiiiiiiiiiiiiiiiiiiiiiiiiiiiiiiiiiiiiiiiiiiiiiiiiiiiiiiiiiiiiiiiiiiiiiiiiiiiiiiiiiiiiiiiiiiiiiiiiiiiiiiiiiiiiiiiiiiiiiiiiiiiiiiiiiiiiiiiiiiiiiiiiiiiiiiiiiiiiiiiiiiiiiiiiiiiiiiiiiiiiiiiiiiiiiiiiiiiiiiiiiiiiiiiiiiiiiiiiiiiiiiiiiiiiiiiiiiiiiiiiiiiiiiiiiiiiiiiiiiiiiiiiiiiiiiiiiiiiiiiiiiiiiiiiiiiiiiiiiiiiiiiiiiiiiiiiiiiiiiiiiiiiiiiiiiiiiiiiiiiiiiiiiiiiiiiiiiiiiiiiiiiiiiiiiiiiiiiiiiiiiiiiiiiiiiiiiiiiiiiiiiiiiiiiiiiiiiiiiiiiiiiiiiiiiiiiiiiiiiiiiiiiiiiiiiiiiiiiiiiiiiiiiiiiiiiiiiiiiiiiiiiiiiiiiiiiiiiiiiiiiiiiiiiiiiiiiiiiiiiiiiiiiiiiiiiiiiiiiiiiiiiiiiiiiiiiiiiiiiiiiiiiiiiiiiiiiiiiiiiiiiiiiiiiiiiiiiiiiiiiiiiiiiiiiiiiiiiiiiiiiiiiiiiiiiiiiiiiiiiiiiiiiiiiiiiiiiiiiiiiiiiiiiiiiiiiiiiiiiiiiiiiiiiiiiiiiiiiiiiiiiiiiiiiiiiiiiiiiiiiiiiiiiiiiiiiiiiiiiiiiiiiiiiiiiiiiiiiiiiiiiiiiiiiiiiiiiiiiiiiiiiiiiiiiiiiiiiiiiiiiiiiiiiiiiiiiiiiiiiiiiiiiiiiiiiiiiiiiiiiiiiiiiiiiiiiiiiiiiiiiiiiiiiiiiiiiiiiiiiiiiiiiiiiiiiiiiiiiiiiiiiiiiiiiiiiiiiiiiiiiiiiiiiiiiiiiiiiiiiiiiiiiiiiiiiiiiiiiiiiiiiiiiiiiiiiiiiiiiiiiiiiiiiiiiiiiiiiiiiiiiiiiiiiiiiiiiiiiiiiiiiiiiiiiiiiiiiiiiiiiiiiiiiiiiiiiiiiiiiiiiiiiiiiiiiiiiiiiiiiiiiiiiiiiiiiiiiiiiiiiiiiiiiiiiiiiiiiiiiiiiiiiiiiiiiiiiiiiiiiiiiiiiiiiiiiiiiiiiiiiiiiiiiiiiiiiiiiiiiiiiiiiiiiiiiiiiiiiiiiiiiiiiiiiiiiiiiiiiiiiiiiiiiiiiiiiiiiiiiiiiiiiiiiiiiiiiiiiiiiiiiiiiiiiiiiiiiiiiiiiiiiiiiiiiiiiiiiiiiiiiiiiiiiiiiiiiiiiiiiiiiiiiiiiiiiiiiiiiiiiiiiiiiiiiiiiiiiiiiiiiiiiiiiiiiiiiiiiiiiiiiiiiiiiiiiiiiiiiiiiiiiiiiiiiiiiiiiiiiiiiiiiiiiiiiiiiiiiiiiiiiiiiiiiiiiiiiiiiiiiiiiiiiiiiiiiiiiiiiiiiiiiiiiiiiiiiiiiiiiiiiiiiiiiiiiiiiiiiiiiiiiiiiiiiiiiiiiiiiiiiiiiiiiiiiiiiiiiiiiiiiiiiiiiiiiiiiiiiiiiiiiiiiiiiiiiiiiiiiiiiiiiiiiiiiiiiiiiiiiiiiiiiiiiiiiiiiiiiiiiiiiiiiiiiiiiiiiiiiiiiiiiiiiiiiiiiiiiiiiiiiiiiiiiiiiiiiiiiiiiiiiiiiiiiiiiiiiiiiiiiiiiiiiiiiiiiiiiiiiiiiiiiiiiiiiiiiiiiiiiiiiiiiiiii iiiiiiiiiiiiiiiiiiiiiiiiiiiiiiiiiiiiiiiiiiiiiiiiiiiiiiiiiiiiiiiiiiiiiiiiiiiiiiiiiiiiiiiiiiiiiiiiiiiiiiiiiiiiiiiiiiiiiiiiiiiiiiiiiiiiiiiiiiiiiiiiiiiiiiiiiiiiiiiiiiiiiiiiiiiiiiiiiiiiiiiiiiiiiiiiiiiiiiiiiiiiiiiiiiiiiiiiiiiiiiiiiiiiiiiiiiiiiiiiiiiiiiiiiiiiiiiiiiiiiiiiiiiiiiiiiiiiiiiiiiiiiiiiiiiiiiiiiiiiiiiiiiiiiiiiiiiiiiiiiiiiiiiiiiiiiiiiiiiiiiiiiiiiiiiiiiiiiiiiiiiiiiiiiiiiiiiiiiiiiiiiiiiiiiiiiiiiiiiiiiiiiiiiiiiiiiiiiiiiiiiiiiiiiiiiiiiiiiiiiiiiiiiiiiiiiiiiiiiiiiiiiiiiiiiiiiiiiiiiiiiiiiiiiiiiiiiiiiiiiiiiiiiiiiiiiiiiiiiiiiiiiiiiiiiiiiiiiiiiiiiiiiiiiiiiiiiiiiiiiiiiiiiiiiiiiiiiiiiiiiiiiiiiiiiiiiiiiiiiiiiiiiiiiiiiiiiiiiiiiiiiiiiiiiiiiiiiiiiiiiiiiiiiiiiiiiiiiiiiiiiiiiiiiiiiiiiiiiiiiiiiiiiiiiiiiiiiiiiiiiiiiiiiiiiiiiiiiiiiiiiiiiiiiiiiiiiiiiiiiiiiiiiiiiiiiiiiiiiiiiiiiiiiiiiiiiiiiiiiiiiiiiiiiiiiiiiiiiiiiiiiiiiiiiiiiiiiiiiiiiiiiiiiiiiiiiiiiiiiiiiiiiiiiiiiiiiiiiiiiiiiiiiiiiiiiiiiiiiiiiiiiiiiiiiiiiiiiiiiiiiiiiiiiiiiiiiiiiiiiiiiiiiiiiiiiiiiiiiiiiiiiiiiiiiiiiiiiiiiiiiiiiiiiiiiiiiiiiiiiiiiiiiiiiiiiiiiiiiiiiiiiiiiiiiiiiiiiiiiiiiiiiiiiiiiiiiiiiiiiiiiiiiiiiiiiiiiiiiiiiiiiiiiiiiiiiiiiiiiiiiiiiiiiiiiiiiiiiiiiiiiiiiiiiiiiiiiiiiiiiiiiiiiiiiiiiiiiiiiiiiiiiiiiiiiiiiiiiiiiiiiiiiiiiiiiiiiiiiiiiiiiiiiiiiiiiiiiiiiiiiiiiiiiiiiiiiiiiiiiiiiiiiiiiiiiiiiiiiiiiiiiiiiiiiiiiiiiiiiiiiiiiiiiiiiiiiiiiiiiiiiiiiiiiiiiiiiiiiiiiiiiiiiiiiiiiiiiiiiiiiiiiiiiiiiiiiiiiiiiiiiiiiiiiiiiiiiiiiiiiiiiiiiiiiiiiiiiiiiiiiiiiiiiiiiiiiiiiiiiiiiiiiiiiiiiiiiiiiiiiiiiiiiiiiiiiiiiiiiiiiiiiiiiiiiiiiiiiiiiiiiiiiiiiiiiiiiiiiiiiiiiiiiiiiiiiiiiiiiiiiiiiiiiiiiiiiiiiiiiiiiiiiiiiiiiiiiiiiiiiiiiiiiiiiiiiiiiiiiiiiiiiiiiiiiiiiiiiiiiiiiiiiiiiiiiiiiiiiiiiiiiiiiiiiiiiiiiiiiiiiiiiiiiiiiiiiiiiiiiiiiiiiiiiiiiiiiiiiiiiiiiiiiiiiiiiiiiiiiiiiiiiiiiiiiiiiiiiiiiiiiiiiiiiiiiiiiiiiiiiiiiiiiiiiiiiiiiiiiiiiiiiiiiiiiiiiiiiiiiiiiiiiiiiiiiiiiiiiiiiiiiiiiiiiiiiiiiiiiiiiiiiiiiiiiiiiiiiiiiiiiiiiiiiiiiiiiiiiiiiiiiiiiiiiiiiiiiiiiiiiiiiiiiiiiiiiiiiiiiiiiiiiiiiiiiiiiiiiiiiiiiiiiiiiiiiiiiiiiiiiiiiiiiiiiiiiiiiiiiiiiiiiiii iiiiiiiiiiiiiiiiiiiiiiiiiiiiiiiiiiiiiiiiiiiiiiiiiiiiiiiiiiiiiiiiiiiiiiiiiiiiiiiiiiiiiiiiiiiiiiiiiiiiiiiiiiiiiiiiiiiiiiiiiiiiiiiiiiiiiiiiiiiiiiiiiiiiiiiiiiiiiiiiiiiiiiiiiiiiiiiiiiiiiiiiiiiiiiiiiiiiiiiiiiiiiiiiiiiiiiiiiiiiiiiiiiiiiiiiiiiiiiiiiiiiiiiiiiiiiiiiiiiiiiiiiiiiiiiiiiiiiiiiiiiiiiiiiiiiiiiiiiiiiiiiiiiiiiiiiiiiiiiiiiiiiiiiiiiiiiiiiiiiiiiiiiiiiiiiiiiiiiiiiiiiiiiiiiiiiiiiiiiiiiiiiiiiiiiiiiiiiiiiiiiiiiiiiiiiiiiiiiiiiiiiiiiiiiiiiiiiiiiiiiiiiiiiiiiiiiiiiiiiiiiiiiiiiiiiiiiiiiiiiiiiiiiiiiiiiiiiiiiiiiiiiiiiiiiiiiiiiiiiiiiiiiiiiiiiiiiiiiiiiiiiiiiiiiiiiiiiiiiiiiiiiiiiiiiiiiiiiiiiiiiiiiiiiiiiiiiiiiiiiiiiiiiiiiiiiiiiiiiiiiiiiiiiiiiiiiiiiiiiiiiiiiiiiiiiiiiiiiiiiiiiiiiiiiiiiiiiiiiiiiiiiiiiiiiiiiiiiiiiiiiiiiiiiiiiiiiiiiiiiiiiiiiiiiiiiiiiiiiiiiiiiiiiiiiiiiiiiiiiiiiiiiiiiiiiiiiiiiiiiiiiiiiiiiiiiiiiiiiiiiiiiiiiiiiiiiiiiiiiiiiiiiiiiiiiiiiiiiiiiiiiiiiiiiiiiiiiiiiiiiiiiiiiiiiiiiiiiiiiiiiiiiiiiiiiiiiiiiiiiiiiiiiiiiiiiiiiiiiiiiiiiiiiiiiiiiiiiiiiiiiiiiiiiiiiiiiiiiiiiiiiiiiiiiiiiiiiiiiiiiiiiiiiiiiiiiiiiiiiiiiiiiiiiiiiiiiiiiiiiiiiiiiiiiiiiiiiiiiiiiiiiiiiiiiiiiiiiiiiiiiiiiiiiiiiiiiiiiiiiiiiiiiiiiiiiiiiiiiiiiiiiiiiiiiiiiiiiiiiiiiiiiiiiiiiiiiiiiiiiiiiiiiiiiiiiiiiiiiiiiiiiiiiiiiiiiiiiiiiiiiiiiiiiiiiiiiiiiiiiiiiiiiiiiiiiiiiiiiiiiiiiiiiiiiiiiiiiiiiiiiiiiiiiiiiiiiiiiiiiiiiiiiiiiiiiiiiiiiiiiiiiiiiiiiiiiiiiiiiiiiiiiiiiiiiiiiiiiiiiiiiiiiiiiiiiiiiiiiiiiiiiiiiiiiiiiiiiiiiiiiiiiiiiiiiiiiiiiiiiiiiiiiiiiiiiiiiiiiiiiiiiiiiiiiiiiiiiiiiiiiiiiiiiiiiiiiiiiiiiiiiiiiiiiiiiiiiiiiiiiiiiiiiiiiiiiiiiiiiiiiiiiiiiiiiiiiiiiiiiiiiiiiiiiiiiiiiiiiiiiiiiiiiiiiiiiiiiiiiiiiiiiiiiiiiiiiiiiiiiiiiiiiiiiiiiiiiiiiiiiiiiiiiiiiiiiiiiiiiiiiiiiiiiiiiiiiiiiiiiiiiiiiiiiiiiiiiiiiiiiiiiiiiiiiiiiiiiiiiiiiiiiiiiiiiiiiiiiiiiiiiiiiiiiiiiiiiiiiiiiiiiiiiiiiiiiiiiiiiiiiiiiiiiiiiiiiiiiiiiiiiiiiiiiiiiiiiiiiiiiiiiiiiiiiiiiiiiiiiiiiiiiiiiiiiiiiiiiiiiiiiiiiiiiiiiiiiiiiiiiiiiiiiiiiiiiiiiiiiiiiiiiiiiiiiiiiiiiiiiiiiiiiiiiiiiiiiiiiiiiiiiiiiiiiiiiiiiiiiiiiiiiiiiiiiiiiiiiiiiiiiiiiiiiiiiiiiiiiiiiiiiiiiii iiiiiiiiiiiiiiiiiiiiiiiiiiiiiiiiiiiiiiiiiiiiiiiiiiiiiiiiiiiiiiiiiiiiiiiiiiiiiiiiiiiiiiiiiiiiiiiiiiiiiiiiiiiiiiiiiiiiiiiiiiiiiiiiiiiiiiiiiiiiiiiiiiiiiiiiiiiiiiiiiiiiiiiiiiiiiiiiiiiiiiiiiiiiiiiiiiiiiiiiiiiiiiiiiiiiiiiiiiiiiiiiiiiiiiiiiiiiiiiiiiiiiiiiiiiiiiiiiiiiiiiiiiiiiiiiiiiiiiiiiiiiiiiiiiiiiiiiiiiiiiiiiiiiiiiiiiiiiiiiiiiiiiiiiiiiiiiiiiiiiiiiiiiiiiiiiiiiiiiiiiiiiiiiiiiiiiiiiiiiiiiiiiiiiiiiiiiiiiiiiiiiiiiiiiiiiiiiiiiiiiiiiiiiiiiiiiiiiiiiiiiiiiiiiiiiiiiiiiiiiiiiiiiiiiiiiiiiiiiiiiiiiiiiiiiiiiiiiiiiiiiiiiiiiiiiiiiiiiiiiiiiiiiiiiiiiiiiiiiiiiiiiiiiiiiiiiiiiiiiiiiiiiiiiiiiiiiiiiiiiiiiiiiiiiiiiiiiiiiiiiiiiiiiiiiiiiiiiiiiiiiiiiiiiiiiiiiiiiiiiiiiiiiiiiiiiiiiiiiiiiiiiiiiiiiiiiiiiiiiiiiiiiiiiiiiiiiiiiiiiiiiiiiiiiiiiiiiiiiiiiiiiiiiiiiiiiiiiiiiiiiiiiiiiiiiiiiiiiiiiiiiiiiiiiiiiiiiiiiiiiiiiiiiiiiiiiiiiiiiiiiiiiiiiiiiiiiiiiiiiiiiiiiiiiiiiiiiiiiiiiiiiiiiiiiiiiiiiiiiiiiiiiiiiiiiiiiiiiiiiiiiiiiiiiiiiiiiiiiiiiiiiiiiiiiiiiiiiiiiiiiiiiiiiiiiiiiiiiiiiiiiiiiiiiiiiiiiiiiiiiiiiiiiiiiiiiiiiiiiiiiiiiiiiiiiiiiiiiiiiiiiiiiiiiiiiiiiiiiiiiiiiiiiiiiiiiiiiiiiiiiiiiiiiiiiiiiiiiiiiiiiiiiiiiiiiiiiiiiiiiiiiiiiiiiiiiiiiiiiiiiiiiiiiiiiiiiiiiiiiiiiiiiiiiiiiiiiiiiiiiiiiiiiiiiiiiiiiiiiiiiiiiiiiiiiiiiiiiiiiiiiiiiiiiiiiiiiiiiiiiiiiiiiiiiiiiiiiiiiiiiiiiiiiiiiiiiiiiiiiiiiiiiiiiiiiiiiiiiiiiiiiiiiiiiiiiiiiiiiiiiiiiiiiiiiiiiiiiiiiiiiiiiiiiiiiiiiiiiiiiiiiiiiiiiiiiiiiiiiiiiiiiiiiiiiiiiiiiiiiiiiiiiiiiiiiiiiiiiiiiiiiiiiiiiiiiiiiiiiiiiiiiiiiiiiiiiiiiiiiiiiiiiiiiiiiiiiiiiiiiiiiiiiiiiiiiiiiiiiiiiiiiiiiiiiiiiiiiiiiiiiiiiiiiiiiiiiiiiiiiiiiiiiiiiiiiiiiiiiiiiiiiiiiiiiiiiiiiiiiiiiiiiiiiiiiiiiiiiiiiiiiiiiiiiiiiiiiiiiiiiiiiiiiiiiiiiiiiiiiiiiiiiiiiiiiiiiiiiiiiiiiiiiiiiiiiiiiiiiiiiiiiiiiiiiiiiiiiiiiiiiiiiiiiiiiiiiiiiiiiiiiiiiiiiiiiiiiiiiiiiiiiiiiiiiiiiiiiiiiiiiiiiiiiiiiiiiiiiiiiiiiiiiiiiiiiiiiiiiiiiiiiiiiiiiiiiiiiiiiiiiiiiiiiiiiiiiiiiiiiiiiiiiiiiiiiiiiiiiiiiiiiiiiiiiiiiiiiiiiiiiiiiiiiiiiiiiiiiiiiiiiiiiiiiiiiiiiiiiiiiiiiiiiiiiiiiiiiiiiiiiiiiiiiiiiiiiiiiiiiiiiiiiiiiiiiiiiiiiiiiiiiiiii iiiiiiiiiiiiiiiiiiiiiiiiiiiiiiiiiiiiiiiiiiiiiiiiiiiiiiiiiiiiiiiiiiiiiiiiiiiiiiiiiiiiiiiiiiiiiiiiiiiiiiiiiiiiiiiiiiiiiiiiiiiiiiiiiiiiiiiiiiiiiiiiiiiiiiiiiiiiiiiiiiiiiiiiiiiiiiiiiiiiiiiiiiiiiiiiiiiiiiiiiiiiiiiiiiiiiiiiiiiiiiiiiiiiiiiiiiiiiiiiiiiiiiiiiiiiiiiiiiiiiiiiiiiiiiiiiiiiiiiiiiiiiiiiiiiiiiiiiiiiiiiiiiiiiiiiiiiiiiiiiiiiiiiiiiiiiiiiiiiiiiiiiiiiiiiiiiiiiiiiiiiiiiiiiiiiiiiiiiiiiiiiiiiiiiiiiiiiiiiiiiiiiiiiiiiiiiiiiiiiiiiiiiiiiiiiiiiiiiiiiiiiiiiiiiiiiiiiiiiiiiiiiiiiiiiiiiiiiiiiiiiiiiiiiiiiiiiiiiiiiiiiiiiiiiiiiiiiiiiiiiiiiiiiiiiiiiiiiiiiiiiiiiiiiiiiiiiiiiiiiiiiiiiiiiiiiiiiiiiiiiiiiiiiiiiiiiiiiiiiiiiiiiiiiiiiiiiiiiiiiiiiiiiiiiiiiiiiiiiiiiiiiiiiiiiiiiiiiiiiiiiiiiiiiiiiiiiiiiiiiiiiiiiiiiiiiiiiiiiiiiiiiiiiiiiiiiiiiiiiiiiiiiiiiiiiiiiiiiiiiiiiiiiiiiiiiiiiiiiiiiiiiiiiiiiiiiiiiiiiiiiiiiiiiiiiiiiiiiiiiiiiiiiiiiiiiiiiiiiiiiiiiiiiiiiiiiiiiiiiiiiiiiiiiiiiiiiiiiiiiiiiiiiiiiiiiiiiiiiiiiiiiiiiiiiiiiiiiiiiiiiiiiiiiiiiiiiiiiiiiiiiiiiiiiiiiiiiiiiiiiiiiiiiiiiiiiiiiiiiiiiiiiiiiiiiiiiiiiiiiiiiiiiiiiiiiiiiiiiiiiiiiiiiiiiiiiiiiiiiiiiiiiiiiiiiiiiiiiiiiiiiiiiiiiiiiiiiiiiiiiiiiiiiiiiiiiiiiiiiiiiiiiiiiiiiiiiiiiiiiiiiiiiiiiiiiiiiiiiiiiiiiiiiiiiiiiiiiiiiiiiiiiiiiiiiiiiiiiiiiiiiiiiiiiiiiiiiiiiiiiiiiiiiiiiiiiiiiiiiiiiiiiiiiiiiiiiiiiiiiiiiiiiiiiiiiiiiiiiiiiiiiiiiiiiiiiiiiiiiiiiiiiiiiiiiiiiiiiiiiiiiiiiiiiiiiiiiiiiiiiiiiiiiiiiiiiiiiiiiiiiiiiiiiiiiiiiiiiiiiiiiiiiiiiiiiiiiiiiiiiiiiiiiiiiiiiiiiiiiiiiiiiiiiiiiiiiiiiiiiiiiiiiiiiiiiiiiiiiiiiiiiiiiiiiiiiiiiiiiiiiiiiiiiiiiiiiiiiiiiiiiiiiiiiiiiiiiiiiiiiiiiiiiiiiiiiiiiiiiiiiiiiiiiiiiiiiiiiiiiiiiiiiiiiiiiiiiiiiiiiiiiiiiiiiiiiiiiiiiiiiiiiiiiiiiiiiiiiiiiiiiiiiiiiiiiiiiiiiiiiiiiiiiiiiiiiiiiiiiiiiiiiiiiiiiiiiiiiiiiiiiiiiiiiiiiiiiiiiiiiiiiiiiiiiiiiiiiiiiiiiiiiiiiiiiiiiiiiiiiiiiiiiiiiiiiiiiiiiiiiiiiiiiiiiiiiiiiiiiiiiiiiiiiiiiiiiiiiiiiiiiiiiiiiiiiiiiiiiiiiiiiiiiiiiiiiiiiiiiiiiiiiiiiiiiiiiiiiiiiiiiiiiiiiiiiiiiiiiiiiiiiiiiiiiiiiiiiiiiiiiiiiiiiiiiiiiiiiiiiiiiiiiiiiiiiiiiiiiiiiiiiiiiiiiiiiiiiiiiiiiiiiiiiiiiiiiiiiiiiiiiiiiiii iiiiiiiiiiiiiiiiiiiiiiiiiiiiiiiiiiiiiiiiiiiiiiiiiiiiiiiiiiiiiiiiiiiiiiiiiiiiiiiiiiiiiiiiiiiiiiiiiiiiiiiiiiiiiiiiiiiiiiiiiiiiiiiiiiiiiiiiiiiiiiiiiiiiiiiiiiiiiiiiiiiiiiiiiiiiiiiiiiiiiiiiiiiiiiiiiiiiiiiiiiiiiiiiiiiiiiiiiiiiiiiiiiiiiiiiiiiiiiiiiiiiiiiiiiiiiiiiiiiiiiiiiiiiiiiiiiiiiiiiiiiiiiiiiiiiiiiiiiiiiiiiiiiiiiiiiiiiiiiiiiiiiiiiiiiiiiiiiiiiiiiiiiiiiiiiiiiiiiiiiiiiiiiiiiiiiiiiiiiiiiiiiiiiiiiiiiiiiiiiiiiiiiiiiiiiiiiiiiiiiiiiiiiiiiiiiiiiiiiiiiiiiiiiiiiiiiiiiiiiiiiiiiiiiiiiiiiiiiiiiiiiiiiiiiiiiiiiiiiiiiiiiiiiiiiiiiiiiiiiiiiiiiiiiiiiiiiiiiiiiiiiiiiiiiiiiiiiiiiiiiiiiiiiiiiiiiiiiiiiiiiiiiiiiiiiiiiiiiiiiiiiiiiiiiiiiiiiiiiiiiiiiiiiiiiiiiiiiiiiiiiiiiiiiiiiiiiiiiiiiiiiiiiiiiiiiiiiiiiiiiiiiiiiiiiiiiiiiiiiiiiiiiiiiiiiiiiiiiiiiiiiiiiiiiiiiiiiiiiiiiiiiiiiiiiiiiiiiiiiiiiiiiiiiiiiiiiiiiiiiiiiiiiiiiiiiiiiiiiiiiiiiiiiiiiiiiiiiiiiiiiiiiiiiiiiiiiiiiiiiiiiiiiiiiiiiiiiiiiiiiiiiiiiiiiiiiiiiiiiiiiiiiiiiiiiiiiiiiiiiiiiiiiiiiiiiiiiiiiiiiiiiiiiiiiiiiiiiiiiiiiiiiiiiiiiiiiiiiiiiiiiiiiiiiiiiiiiiiiiiiiiiiiiiiiiiiiiiiiiiiiiiiiiiiiiiiiiiiiiiiiiiiiiiiiiiiiiiiiiiiiiiiiiiiiiiiiiiiiiiiiiiiiiiiiiiiiiiiiiiiiiiiiiiiiiiiiiiiiiiiiiiiiiiiiiiiiiiiiiiiiiiiiiiiiiiiiiiiiiiiiiiiiiiiiiiiiiiiiiiiiiiiiiiiiiiiiiiiiiiiiiiiiiiiiiiiiiiiiiiiiiiiiiiiiiiiiiiiiiiiiiiiiiiiiiiiiiiiiiiiiiiiiiiiiiiiiiiiiiiiiiiiiiiiiiiiiiiiiiiiiiiiiiiiiiiiiiiiiiiiiiiiiiiiiiiiiiiiiiiiiiiiiiiiiiiiiiiiiiiiiiiiiiiiiiiiiiiiiiiiiiiiiiiiiiiiiiiiiiiiiiiiiiiiiiiiiiiiiiiiiiiiiiiiiiiiiiiiiiiiiiiiiiiiiiiiiiiiiiiiiiiiiiiiiiiiiiiiiiiiiiiiiiiiiiiiiiiiiiiiiiiiiiiiiiiiiiiiiiiiiiiiiiiiiiiiiiiiiiiiiiiiiiiiiiiiiiiiiiiiiiiiiiiiiiiiiiiiiiiiiiiiiiiiiiiiiiiiiiiiiiiiiiiiiiiiiiiiiiiiiiiiiiiiiiiiiiiiiiiiiiiiiiiiiiiiiiiiiiiiiiiiiiiiiiiiiiiiiiiiiiiiiiiiiiiiiiiiiiiiiiiiiiiiiiiiiiiiiiiiiiiiiiiiiiiiiiiiiiiiiiiiiiiiiiiiiiiiiiiiiiiiiiiiiiiiiiiiiiiiiiiiiiiiiiiiiiiiiiiiiiiiiiiiiiiiiiiiiiiiiiiiiiiiiiiiiiiiiiiiiiiiiiiiiiiiiiiiiiiiiiiiiiiiiiiiiiiiiiiiiiiiiiiiiiiiiiiiiiiiiiiiiiiiiiiiiiiiiiiiiiiiiiiiiiiiiiiiiiiiiiiiiiiiiiiiiiiiiiiiiiiiiiii iiiiiiiiiiiiiiiiiiiiiiiiiiiiiiiiiiiiiiiiiiiiiiiiiiiiiiiiiiiiiiiiiiiiiiiiiiiiiiiiiiiiiiiiiiiiiiiiiiiiiiiiiiiiiiiiiiiiiiiiiiiiiiiiiiiiiiiiiiiiiiiiiiiiiiiiiiiiiiiiiiiiiiiiiiiiiiiiiiiiiiiiiiiiiiiiiiiiiiiiiiiiiiiiiiiiiiiiiiiiiiiiiiiiiiiiiiiiiiiiiiiiiiiiiiiiiiiiiiiiiiiiiiiiiiiiiiiiiiiiiiiiiiiiiiiiiiiiiiiiiiiiiiiiiiiiiiiiiiiiiiiiiiiiiiiiiiiiiiiiiiiiiiiiiiiiiiiiiiiiiiiiiiiiiiiiiiiiiiiiiiiiiiiiiiiiiiiiiiiiiiiiiiiiiiiiiiiiiiiiiiiiiiiiiiiiiiiiiiiiiiiiiiiiiiiiiiiiiiiiiiiiiiiiiiiiiiiiiiiiiiiiiiiiiiiiiiiiiiiiiiiiiiiiiiiiiiiiiiiiiiiiiiiiiiiiiiiiiiiiiiiiiiiiiiiiiiiiiiiiiiiiiiiiiiiiiiiiiiiiiiiiiiiiiiiiiiiiiiiiiiiiiiiiiiiiiiiiiiiiiiiiiiiiiiiiiiiiiiiiiiiiiiiiiiiiiiiiiiiiiiiiiiiiiiiiiiiiiiiiiiiiiiiiiiiiiiiiiiiiiiiiiiiiiiiiiiiiiiiiiiiiiiiiiiiiiiiiiiiiiiiiiiiiiiiiiiiiiiiiiiiiiiiiiiiiiiiiiiiiiiiiiiiiiiiiiiiiiiiiiiiiiiiiiiiiiiiiiiiiiiiiiiiiiiiiiiiiiiiiiiiiiiiiiiiiiiiiiiiiiiiiiiiiiiiiiiiiiiiiiiiiiiiiiiiiiiiiiiiiiiiiiiiiiiiiiiiiiiiiiiiiiiiiiiiiiiiiiiiiiiiiiiiiiiiiiiiiiiiiiiiiiiiiiiiiiiiiiiiiiiiiiiiiiiiiiiiiiiiiiiiiiiiiiiiiiiiiiiiiiiiiiiiiiiiiiiiiiiiiiiiiiiiiiiiiiiiiiiiiiiiiiiiiiiiiiiiiiiiiiiiiiiiiiiiiiiiiiiiiiiiiiiiiiiiiiiiiiiiiiiiiiiiiiiiiiiiiiiiiiiiiiiiiiiiiiiiiiiiiiiiiiiiiiiiiiiiiiiiiiiiiiiiiiiiiiiiiiiiiiiiiiiiiiiiiiiiiiiiiiiiiiiiiiiiiiiiiiiiiiiiiiiiiiiiiiiiiiiiiiiiiiiiiiiiiiiiiiiiiiiiiiiiiiiiiiiiiiiiiiiiiiiiiiiiiiiiiiiiiiiiiiiiiiiiiiiiiiiiiiiiiiiiiiiiiiiiiiiiiiiiiiiiiiiiiiiiiiiiiiiiiiiiiiiiiiiiiiiiiiiiiiiiiiiiiiiiiiiiiiiiiiiiiiiiiiiiiiiiiiiiiiiiiiiiiiiiiiiiiiiiiiiiiiiiiiiiiiiiiiiiiiiiiiiiiiiiiiiiiiiiiiiiiiiiiiiiiiiiiiiiiiiiiiiiiiiiiiiiiiiiiiiiiiiiiiiiiiiiiiiiiiiiiiiiiiiiiiiiiiiiiiiiiiiiiiiiiiiiiiiiiiiiiiiiiiiiiiiiiiiiiiiiiiiiiiiiiiiiiiiiiiiiiiiiiiiiiiiiiiiiiiiiiiiiiiiiiiiiiiiiiiiiiiiiiiiiiiiiiiiiiiiiiiiiiiiiiiiiiiiiiiiiiiiiiiiiiiiiiiiiiiiiiiiiiiiiiiiiiiiiiiiiiiiiiiiiiiiiiiiiiiiiiiiiiiiiiiiiiiiiiiiiiiiiiiiiiiiiiiiiiiiiiiiiiiiiiiiiiiiiiiiiiiiiiiiiiiiiiiiiiiiiiiiiiiiiiiiiiiiiiiiiiiiiiiiiiiiiiiiiiiiiiiiiiiiiiiiiiiiiiiiiiiiiiiiiiiiiiiiiiiiiiii iiiiiiiiiiiiiiiiiiiiiiiiiiiiiiiiiiiiiiiiiiiiiiiiiiiiiiiiiiiiiiiiiiiiiiiiiiiiiiiiiiiiiiiiiiiiiiiiiiiiiiiiiiiiiiiiiiiiiiiiiiiiiiiiiiiiiiiiiiiiiiiiiiiiiiiiiiiiiiiiiiiiiiiiiiiiiiiiiiiiiiiiiiiiiiiiiiiiiiiiiiiiiiiiiiiiiiiiiiiiiiiiiiiiiiiiiiiiiiiiiiiiiiiiiiiiiiiiiiiiiiiiiiiiiiiiiiiiiiiiiiiiiiiiiiiiiiiiiiiiiiiiiiiiiiiiiiiiiiiiiiiiiiiiiiiiiiiiiiiiiiiiiiiiiiiiiiiiiiiiiiiiiiiiiiiiiiiiiiiiiiiiiiiiiiiiiiiiiiiiiiiiiiiiiiiiiiiiiiiiiiiiiiiiiiiiiiiiiiiiiiiiiiiiiiiiiiiiiiiiiiiiiiiiiiiiiiiiiiiiiiiiiiiiiiiiiiiiiiiiiiiiiiiiiiiiiiiiiiiiiiiiiiiiiiiiiiiiiiiiiiiiiiiiiiiiiiiiiiiiiiiiiiiiiiiiiiiiiiiiiiiiiiiiiiiiiiiiiiiiiiiiiiiiiiiiiiiiiiiiiiiiiiiiiiiiiiiiiiiiiiiiiiiiiiiiiiiiiiiiiiiiiiiiiiiiiiiiiiiiiiiiiiiiiiiiiiiiiiiiiiiiiiiiiiiiiiiiiiiiiiiiiiiiiiiiiiiiiiiiiiiiiiiiiiiiiiiiiiiiiiiiiiiiiiiiiiiiiiiiiiiiiiiiiiiiiiiiiiiiiiiiiiiiiiiiiiiiiiiiiiiiiiiiiiiiiiiiiiiiiiiiiiiiiiiiiiiiiiiiiiiiiiiiiiiiiiiiiiiiiiiiiiiiiiiiiiiiiiiiiiiiiiiiiiiiiiiiiiiiiiiiiiiiiiiiiiiiiiiiiiiiiiiiiiiiiiiiiiiiiiiiiiiiiiiiiiiiiiiiiiiiiiiiiiiiiiiiiiiiiiiiiiiiiiiiiiiiiiiiiiiiiiiiiiiiiiiiiiiiiiiiiiiiiiiiiiiiiiiiiiiiiiiiiiiiiiiiiiiiiiiiiiiiiiiiiiiiiiiiiiiiiiiiiiiiiiiiiiiiiiiiiiiiiiiiiiiiiiiiiiiiiiiiiiiiiiiiiiiiiiiiiiiiiiiiiiiiiiiiiiiiiiiiiiiiiiiiiiiiiiiiiiiiiiiiiiiiiiiiiiiiiiiiiiiiiiiiiiiiiiiiiiiiiiiiiiiiiiiiiiiiiiiiiiiiiiiiiiiiiiiiiiiiiiiiiiiiiiiiiiiiiiiiiiiiiiiiiiiiiiiiiiiiiiiiiiiiiiiiiiiiiiiiiiiiiiiiiiiiiiiiiiiiiiiiiiiiiiiiiiiiiiiiiiiiiiiiiiiiiiiiiiiiiiiiiiiiiiiiiiiiiiiiiiiiiiiiiiiiiiiiiiiiiiiiiiiiiiiiiiiiiiiiiiiiiiiiiiiiiiiiiiiiiiiiiiiiiiiiiiiiiiiiiiiiiiiiiiiiiiiiiiiiiiiiiiiiiiiiiiiiiiiiiiiiiiiiiiiiiiiiiiiiiiiiiiiiiiiiiiiiiiiiiiiiiiiiiiiiiiiiiiiiiiiiiiiiiiiiiiiiiiiiiiiiiiiiiiiiiiiiiiiiiiiiiiiiiiiiiiiiiiiiiiiiiiiiiiiiiiiiiiiiiiiiiiiiiiiiiiiiiiiiiiiiiiiiiiiiiiiiiiiiiiiiiiiiiiiiiiiiiiiiiiiiiiiiiiiiiiiiiiiiiiiiiiiiiiiiiiiiiiiiiiiiiiiiiiiiiiiiiiiiiiiiiiiiiiiiiiiiiiiiiiiiiiiiiiiiiiiiiiiiiiiiiiiiiiiiiiiiiiiiiiiiiiiiiiiiiiiiiiiiiiiiiiiiiiiiiiiiiiiiiiiiiiiiiiiiiiiiiiiiiiiiiiiiiiiiiiiiiiiiii iiiiiiiiiiiiiiiiiiiiiiiiiiiiiiiiiiiiiiiiiiiiiiiiiiiiiiiiiiiiiiiiiiiiiiiiiiiiiiiiiiiiiiiiiiiiiiiiiiiiiiiiiiiiiiiiiiiiiiiiiiiiiiiiiiiiiiiiiiiiiiiiiiiiiiiiiiiiiiiiiiiiiiiiiiiiiiiiiiiiiiiiiiiiiiiiiiiiiiiiiiiiiiiiiiiiiiiiiiiiiiiiiiiiiiiiiiiiiiiiiiiiiiiiiiiiiiiiiiiiiiiiiiiiiiiiiiiiiiiiiiiiiiiiiiiiiiiiiiiiiiiiiiiiiiiiiiiiiiiiiiiiiiiiiiiiiiiiiiiiiiiiiiiiiiiiiiiiiiiiiiiiiiiiiiiiiiiiiiiiiiiiiiiiiiiiiiiiiiiiiiiiiiiiiiiiiiiiiiiiiiiiiiiiiiiiiiiiiiiiiiiiiiiiiiiiiiiiiiiiiiiiiiiiiiiiiiiiiiiiiiiiiiiiiiiiiiiiiiiiiiiiiiiiiiiiiiiiiiiiiiiiiiiiiiiiiiiiiiiiiiiiiiiiiiiiiiiiiiiiiiiiiiiiiiiiiiiiiiiiiiiiiiiiiiiiiiiiiiiiiiiiiiiiiiiiiiiiiiiiiiiiiiiiiiiiiiiiiiiiiiiiiiiiiiiiiiiiiiiiiiiiiiiiiiiiiiiiiiiiiiiiiiiiiiiiiiiiiiiiiiiiiiiiiiiiiiiiiiiiiiiiiiiiiiiiiiiiiiiiiiiiiiiiiiiiiiiiiiiiiiiiiiiiiiiiiiiiiiiiiiiiiiiiiiiiiiiiiiiiiiiiiiiiiiiiiiiiiiiiiiiiiiiiiiiiiiiiiiiiiiiiiiiiiiiiiiiiiiiiiiiiiiiiiiiiiiiiiiiiiiiiiiiiiiiiiiiiiiiiiiiiiiiiiiiiiiiiiiiiiiiiiiiiiiiiiiiiiiiiiiiiiiiiiiiiiiiiiiiiiiiiiiiiiiiiiiiiiiiiiiiiiiiiiiiiiiiiiiiiiiiiiiiiiiiiiiiiiiiiiiiiiiiiiiiiiiiiiiiiiiiiiiiiiiiiiiiiiiiiiiiiiiiiiiiiiiiiiiiiiiiiiiiiiiiiiiiiiiiiiiiiiiiiiiiiiiiiiiiiiiiiiiiiiiiiiiiiiiiiiiiiiiiiiiiiiiiiiiiiiiiiiiiiiiiiiiiiiiiiiiiiiiiiiiiiiiiiiiiiiiiiiiiiiiiiiiiiiiiiiiiiiiiiiiiiiiiiiiiiiiiiiiiiiiiiiiiiiiiiiiiiiiiiiiiiiiiiiiiiiiiiiiiiiiiiiiiiiiiiiiiiiiiiiiiiiiiiiiiiiiiiiiiiiiiiiiiiiiiiiiiiiiiiiiiiiiiiiiiiiiiiiiiiiiiiiiiiiiiiiiiiiiiiiiiiiiiiiiiiiiiiiiiiiiiiiiiiiiiiiiiiiiiiiiiiiiiiiiiiiiiiiiiiiiiiiiiiiiiiiiiiiiiiiiiiiiiiiiiiiiiiiiiiiiiiiiiiiiiiiiiiiiiiiiiiiiiiiiiiiiiiiiiiiiiiiiiiiiiiiiiiiiiiiiiiiiiiiiiiiiiiiiiiiiiiiiiiiiiiiiiiiiiiiiiiiiiiiiiiiiiiiiiiiiiiiiiiiiiiiiiiiiiiiiiiiiiiiiiiiiiiiiiiiiiiiiiiiiiiiiiiiiiiiiiiiiiiiiiiiiiiiiiiiiiiiiiiiiiiiiiiiiiiiiiiiiiiiiiiiiiiiiiiiiiiiiiiiiiiiiiiiiiiiiiiiiiiiiiiiiiiiiiiiiiiiiiiiiiiiiiiiiiiiiiiiiiiiiiiiiiiiiiiiiiiiiiiiiiiiiiiiiiiiiiiiiiiiiiiiiiiiiiiiiiiiiiiiiiiiiiiiiiiiiiiiiiiiiiiiiiiiiiiiiiiiiiiiiiiiiiiiiiiiiiiiiiiiiiiiiiiiiiiiiiiiiiiiiiiiiiiiiiiii iiiiiiiiiiiiiiiiiiiiiiiiiiiiiiiiiiiiiiiiiiiiiiiiiiiiiiiiiiiiiiiiiiiiiiiiiiiiiiiiiiiiiiiiiiiiiiiiiiiiiiiiiiiiiiiiiiiiiiiiiiiiiiiiiiiiiiiiiiiiiiiiiiiiiiiiiiiiiiiiiiiiiiiiiiiiiiiiiiiiiiiiiiiiiiiiiiiiiiiiiiiiiiiiiiiiiiiiiiiiiiiiiiiiiiiiiiiiiiiiiiiiiiiiiiiiiiiiiiiiiiiiiiiiiiiiiiiiiiiiiiiiiiiiiiiiiiiiiiiiiiiiiiiiiiiiiiiiiiiiiiiiiiiiiiiiiiiiiiiiiiiiiiiiiiiiiiiiiiiiiiiiiiiiiiiiiiiiiiiiiiiiiiiiiiiiiiiiiiiiiiiiiiiiiiiiiiiiiiiiiiiiiiiiiiiiiiiiiiiiiiiiiiiiiiiiiiiiiiiiiiiiiiiiiiiiiiiiiiiiiiiiiiiiiiiiiiiiiiiiiiiiiiiiiiiiiiiiiiiiiiiiiiiiiiiiiiiiiiiiiiiiiiiiiiiiiiiiiiiiiiiiiiiiiiiiiiiiiiiiiiiiiiiiiiiiiiiiiiiiiiiiiiiiiiiiiiiiiiiiiiiiiiiiiiiiiiiiiiiiiiiiiiiiiiiiiiiiiiiiiiiiiiiiiiiiiiiiiiiiiiiiiiiiiiiiiiiiiiiiiiiiiiiiiiiiiiiiiiiiiiiiiiiiiiiiiiiiiiiiiiiiiiiiiiiiiiiiiiiiiiiiiiiiiiiiiiiiiiiiiiiiiiiiiiiiiiiiiiiiiiiiiiiiiiiiiiiiiiiiiiiiiiiiiiiiiiiiiiiiiiiiiiiiiiiiiiiiiiiiiiiiiiiiiiiiiiiiiiiiiiiiiiiiiiiiiiiiiiiiiiiiiiiiiiiiiiiiiiiiiiiiiiiiiiiiiiiiiiiiiiiiiiiiiiiiiiiiiiiiiiiiiiiiiiiiiiiiiiiiiiiiiiiiiiiiiiiiiiiiiiiiiiiiiiiiiiiiiiiiiiiiiiiiiiiiiiiiiiiiiiiiiiiiiiiiiiiiiiiiiiiiiiiiiiiiiiiiiiiiiiiiiiiiiiiiiiiiiiiiiiiiiiiiiiiiiiiiiiiiiiiiiiiiiiiiiiiiiiiiiiiiiiiiiiiiiiiiiiiiiiiiiiiiiiiiiiiiiiiiiiiiiiiiiiiiiiiiiiiiiiiiiiiiiiiiiiiiiiiiiiiiiiiiiiiiiiiiiiiiiiiiiiiiiiiiiiiiiiiiiiiiiiiiiiiiiiiiiiiiiiiiiiiiiiiiiiiiiiiiiiiiiiiiiiiiiiiiiiiiiiiiiiiiiiiiiiiiiiiiiiiiiiiiiiiiiiiiiiiiiiiiiiiiiiiiiiiiiiiiiiiiiiiiiiiiiiiiiiiiiiiiiiiiiiiiiiiiiiiiiiiiiiiiiiiiiiiiiiiiiiiiiiiiiiiiiiiiiiiiiiiiiiiiiiiiiiiiiiiiiiiiiiiiiiiiiiiiiiiiiiiiiiiiiiiiiiiiiiiiiiiiiiiiiiiiiiiiiiiiiiiiiiiiiiiiiiiiiiiiiiiiiiiiiiiiiiiiiiiiiiiiiiiiiiiiiiiiiiiiiiiiiiiiiiiiiiiiiiiiiiiiiiiiiiiiiiiiiiiiiiiiiiiiiiiiiiiiiiiiiiiiiiiiiiiiiiiiiiiiiiiiiiiiiiiiiiiiiiiiiiiiiiiiiiiiiiiiiiiiiiiiiiiiiiiiiiiiiiiiiiiiiiiiiiiiiiiiiiiiiiiiiiiiiiiiiiiiiiiiiiiiiiiiiiiiiiiiiiiiiiiiiiiiiiiiiiiiiiiiiiiiiiiiiiiiiiiiiiiiiiiiiiiiiiiiiiiiiiiiiiiiiiiiiiiiiiiiiiiiiiiiiiiiiiiiiiiiiiiiiiiiiiiiiiiiiiiiiiiiiiiiiiiiiiiiiiiiiiiiiiiiiiiiii iiiiiiiiiiiiiiiiiiiiiiiiiiiiiiiiiiiiiiiiiiiiiiiiiiiiiiiiiiiiiiiiiiiiiiiiiiiiiiiiiiiiiiiiiiiiiiiiiiiiiiiiiiiiiiiiiiiiiiiiiiiiiiiiiiiiiiiiiiiiiiiiiiiiiiiiiiiiiiiiiiiiiiiiiiiiiiiiiiiiiiiiiiiiiiiiiiiiiiiiiiiiiiiiiiiiiiiiiiiiiiiiiiiiiiiiiiiiiiiiiiiiiiiiiiiiiiiiiiiiiiiiiiiiiiiiiiiiiiiiiiiiiiiiiiiiiiiiiiiiiiiiiiiiiiiiiiiiiiiiiiiiiiiiiiiiiiiiiiiiiiiiiiiiiiiiiiiiiiiiiiiiiiiiiiiiiiiiiiiiiiiiiiiiiiiiiiiiiiiiiiiiiiiiiiiiiiiiiiiiiiiiiiiiiiiiiiiiiiiiiiiiiiiiiiiiiiiiiiiiiiiiiiiiiiiiiiiiiiiiiiiiiiiiiiiiiiiiiiiiiiiiiiiiiiiiiiiiiiiiiiiiiiiiiiiiiiiiiiiiiiiiiiiiiiiiiiiiiiiiiiiiiiiiiiiiiiiiiiiiiiiiiiiiiiiiiiiiiiiiiiiiiiiiiiiiiiiiiiiiiiiiiiiiiiiiiiiiiiiiiiiiiiiiiiiiiiiiiiiiiiiiiiiiiiiiiiiiiiiiiiiiiiiiiiiiiiiiiiiiiiiiiiiiiiiiiiiiiiiiiiiiiiiiiiiiiiiiiiiiiiiiiiiiiiiiiiiiiiiiiiiiiiiiiiiiiiiiiiiiiiiiiiiiiiiiiiiiiiiiiiiiiiiiiiiiiiiiiiiiiiiiiiiiiiiiiiiiiiiiiiiiiiiiiiiiiiiiiiiiiiiiiiiiiiiiiiiiiiiiiiiiiiiiiiiiiiiiiiiiiiiiiiiiiiiiiiiiiiiiiiiiiiiiiiiiiiiiiiiiiiiiiiiiiiiiiiiiiiiiiiiiiiiiiiiiiiiiiiiiiiiiiiiiiiiiiiiiiiiiiiiiiiiiiiiiiiiiiiiiiiiiiiiiiiiiiiiiiiiiiiiiiiiiiiiiiiiiiiiiiiiiiiiiiiiiiiiiiiiiiiiiiiiiiiiiiiiiiiiiiiiiiiiiiiiiiiiiiiiiiiiiiiiiiiiiiiiiiiiiiiiiiiiiiiiiiiiiiiiiiiiiiiiiiiiiiiiiiiiiiiiiiiiiiiiiiiiiiiiiiiiiiiiiiiiiiiiiiiiiiiiiiiiiiiiiiiiiiiiiiiiiiiiiiiiiiiiiiiiiiiiiiiiiiiiiiiiiiiiiiiiiiiiiiiiiiiiiiiiiiiiiiiiiiiiiiiiiiiiiiiiiiiiiiiiiiiiiiiiiiiiiiiiiiiiiiiiiiiiiiiiiiiiiiiiiiiiiiiiiiiiiiiiiiiiiiiiiiiiiiiiiiiiiiiiiiiiiiiiiiiiiiiiiiiiiiiiiiiiiiiiiiiiiiiiiiiiiiiiiiiiiiiiiiiiiiiiiiiiiiiiiiiiiiiiiiiiiiiiiiiiiiiiiiiiiiiiiiiiiiiiiiiiiiiiiiiiiiiiiiiiiiiiiiiiiiiiiiiiiiiiiiiiiiiiiiiiiiiiiiiiiiiiiiiiiiiiiiiiiiiiiiiiiiiiiiiiiiiiiiiiiiiiiiiiiiiiiiiiiiiiiiiiiiiiiiiiiiiiiiiiiiiiiiiiiiiiiiiiiiiiiiiiiiiiiiiiiiiiiiiiiiiiiiiiiiiiiiiiiiiiiiiiiiiiiiiiiiiiiiiiiiiiiiiiiiiiiiiiiiiiiiiiiiiiiiiiiiiiiiiiiiiiiiiiiiiiiiiiiiiiiiiiiiiiiiiiiiiiiiiiiiiiiiiiiiiiiiiiiiiiiiiiiiiiiiiiiiiiiiiiiiiiiiiiiiiiiiiiiiiiiiiiiiiiiiiiiiiiiiiiiiiiiiiiiiiiiiiiiiiiiiiiiiiiiiiiiiiiiiiiiiiiiii iiiiiiiiiiiiiiiiiiiiiiiiiiiiiiiiiiiiiiiiiiiiiiiiiiiiiiiiiiiiiiiiiiiiiiiiiiiiiiiiiiiiiiiiiiiiiiiiiiiiiiiiiiiiiiiiiiiiiiiiiiiiiiiiiiiiiiiiiiiiiiiiiiiiiiiiiiiiiiiiiiiiiiiiiiiiiiiiiiiiiiiiiiiiiiiiiiiiiiiiiiiiiiiiiiiiiiiiiiiiiiiiiiiiiiiiiiiiiiiiiiiiiiiiiiiiiiiiiiiiiiiiiiiiiiiiiiiiiiiiiiiiiiiiiiiiiiiiiiiiiiiiiiiiiiiiiiiiiiiiiiiiiiiiiiiiiiiiiiiiiiiiiiiiiiiiiiiiiiiiiiiiiiiiiiiiiiiiiiiiiiiiiiiiiiiiiiiiiiiiiiiiiiiiiiiiiiiiiiiiiiiiiiiiiiiiiiiiiiiiiiiiiiiiiiiiiiiiiiiiiiiiiiiiiiiiiiiiiiiiiiiiiiiiiiiiiiiiiiiiiiiiiiiiiiiiiiiiiiiiiiiiiiiiiiiiiiiiiiiiiiiiiiiiiiiiiiiiiiiiiiiiiiiiiiiiiiiiiiiiiiiiiiiiiiiiiiiiiiiiiiiiiiiiiiiiiiiiiiiiiiiiiiiiiiiiiiiiiiiiiiiiiiiiiiiiiiiiiiiiiiiiiiiiiiiiiiiiiiiiiiiiiiiiiiiiiiiiiiiiiiiiiiiiiiiiiiiiiiiiiiiiiiiiiiiiiiiiiiiiiiiiiiiiiiiiiiiiiiiiiiiiiiiiiiiiiiiiiiiiiiiiiiiiiiiiiiiiiiiiiiiiiiiiiiiiiiiiiiiiiiiiiiiiiiiiiiiiiiiiiiiiiiiiiiiiiiiiiiiiiiiiiiiiiiiiiiiiiiiiiiiiiiiiiiiiiiiiiiiiiiiiiiiiiiiiiiiiiiiiiiiiiiiiiiiiiiiiiiiiiiiiiiiiiiiiiiiiiiiiiiiiiiiiiiiiiiiiiiiiiiiiiiiiiiiiiiiiiiiiiiiiiiiiiiiiiiiiiiiiiiiiiiiiiiiiiiiiiiiiiiiiiiiiiiiiiiiiiiiiiiiiiiiiiiiiiiiiiiiiiiiiiiiiiiiiiiiiiiiiiiiiiiiiiiiiiiiiiiiiiiiiiiiiiiiiiiiiiiiiiiiiiiiiiiiiiiiiiiiiiiiiiiiiiiiiiiiiiiiiiiiiiiiiiiiiiiiiiiiiiiiiiiiiiiiiiiiiiiiiiiiiiiiiiiiiiiiiiiiiiiiiiiiiiiiiiiiiiiiiiiiiiiiiiiiiiiiiiiiiiiiiiiiiiiiiiiiiiiiiiiiiiiiiiiiiiiiiiiiiiiiiiiiiiiiiiiiiiiiiiiiiiiiiiiiiiiiiiiiiiiiiiiiiiiiiiiiiiiiiiiiiiiiiiiiiiiiiiiiiiiiiiiiiiiiiiiiiiiiiiiiiiiiiiiiiiiiiiiiiiiiiiiiiiiiiiiiiiiiiiiiiiiiiiiiiiiiiiiiiiiiiiiiiiiiiiiiiiiiiiiiiiiiiiiiiiiiiiiiiiiiiiiiiiiiiiiiiiiiiiiiiiiiiiiiiiiiiiiiiiiiiiiiiiiiiiiiiiiiiiiiiiiiiiiiiiiiiiiiiiiiiiiiiiiiiiiiiiiiiiiiiiiiiiiiiiiiiiiiiiiiiiiiiiiiiiiiiiiiiiiiiiiiiiiiiiiiiiiiiiiiiiiiiiiiiiiiiiiiiiiiiiiiiiiiiiiiiiiiiiiiiiiiiiiiiiiiiiiiiiiiiiiiiiiiiiiiiiiiiiiiiiiiiiiiiiiiiiiiiiiiiiiiiiiiiiiiiiiiiiiiiiiiiiiiiiiiiiiiiiiiiiiiiiiiiiiiiiiiiiiiiiiiiiiiiiiiiiiiiiiiiiiiiiiiiiiiiiiiiiiiiiiiiiiiiiiiiiiiiiiiiiiiiiiiiiiiiiiiiiiiiiiiiiiiiiiiiiiiiiiiiiiiiii iiiiiiiiiiiiiiiiiiiiiiiiiiiiiiiiiiiiiiiiiiiiiiiiiiiiiiiiiiiiiiiiiiiiiiiiiiiiiiiiiiiiiiiiiiiiiiiiiiiiiiiiiiiiiiiiiiiiiiiiiiiiiiiiiiiiiiiiiiiiiiiiiiiiiiiiiiiiiiiiiiiiiiiiiiiiiiiiiiiiiiiiiiiiiiiiiiiiiiiiiiiiiiiiiiiiiiiiiiiiiiiiiiiiiiiiiiiiiiiiiiiiiiiiiiiiiiiiiiiiiiiiiiiiiiiiiiiiiiiiiiiiiiiiiiiiiiiiiiiiiiiiiiiiiiiiiiiiiiiiiiiiiiiiiiiiiiiiiiiiiiiiiiiiiiiiiiiiiiiiiiiiiiiiiiiiiiiiiiiiiiiiiiiiiiiiiiiiiiiiiiiiiiiiiiiiiiiiiiiiiiiiiiiiiiiiiiiiiiiiiiiiiiiiiiiiiiiiiiiiiiiiiiiiiiiiiiiiiiiiiiiiiiiiiiiiiiiiiiiiiiiiiiiiiiiiiiiiiiiiiiiiiiiiiiiiiiiiiiiiiiiiiiiiiiiiiiiiiiiiiiiiiiiiiiiiiiiiiiiiiiiiiiiiiiiiiiiiiiiiiiiiiiiiiiiiiiiiiiiiiiiiiiiiiiiiiiiiiiiiiiiiiiiiiiiiiiiiiiiiiiiiiiiiiiiiiiiiiiiiiiiiiiiiiiiiiiiiiiiiiiiiiiiiiiiiiiiiiiiiiiiiiiiiiiiiiiiiiiiiiiiiiiiiiiiiiiiiiiiiiiiiiiiiiiiiiiiiiiiiiiiiiiiiiiiiiiiiiiiiiiiiiiiiiiiiiiiiiiiiiiiiiiiiiiiiiiiiiiiiiiiiiiiiiiiiiiiiiiiiiiiiiiiiiiiiiiiiiiiiiiiiiiiiiiiiiiiiiiiiiiiiiiiiiiiiiiiiiiiiiiiiiiiiiiiiiiiiiiiiiiiiiiiiiiiiiiiiiiiiiiiiiiiiiiiiiiiiiiiiiiiiiiiiiiiiiiiiiiiiiiiiiiiiiiiiiiiiiiiiiiiiiiiiiiiiiiiiiiiiiiiiiiiiiiiiiiiiiiiiiiiiiiiiiiiiiiiiiiiiiiiiiiiiiiiiiiiiiiiiiiiiiiiiiiiiiiiiiiiiiiiiiiiiiiiiiiiiiiiiiiiiiiiiiiiiiiiiiiiiiiiiiiiiiiiiiiiiiiiiiiiiiiiiiiiiiiiiiiiiiiiiiiiiiiiiiiiiiiiiiiiiiiiiiiiiiiiiiiiiiiiiiiiiiiiiiiiiiiiiiiiiiiiiiiiiiiiiiiiiiiiiiiiiiiiiiiiiiiiiiiiiiiiiiiiiiiiiiiiiiiiiiiiiiiiiiiiiiiiiiiiiiiiiiiiiiiiiiiiiiiiiiiiiiiiiiiiiiiiiiiiiiiiiiiiiiiiiiiiiiiiiiiiiiiiiiiiiiiiiiiiiiiiiiiiiiiiiiiiiiiiiiiiiiiiiiiiiiiiiiiiiiiiiiiiiiiiiiiiiiiiiiiiiiiiiiiiiiiiiiiiiiiiiiiiiiiiiiiiiiiiiiiiiiiiiiiiiiiiiiiiiiiiiiiiiiiiiiiiiiiiiiiiiiiiiiiiiiiiiiiiiiiiiiiiiiiiiiiiiiiiiiiiiiiiiiiiiiiiiiiiiiiiiiiiiiiiiiiiiiiiiiiiiiiiiiiiiiiiiiiiiiiiiiiiiiiiiiiiiiiiiiiiiiiiiiiiiiiiiiiiiiiiiiiiiiiiiiiiiiiiiiiiiiiiiiiiiiiiiiiiiiiiiiiiiiiiiiiiiiiiiiiiiiiiiiiiiiiiiiiiiiiiiiiiiiiiiiiiiiiiiiiiiiiiiiiiiiiiiiiiiiiiiiiiiiiiiiiiiiiiiiiiiiiiiiiiiiiiiiiiiiiiiiiiiiiiiiiiiiiiiiiiiiiiiiiiiiiiiiiiiiiiiiiiiiiiiiiiiiiiiiiiiiiiiiiiiiiiiiiiiiiiiii iiiiiiiiiiiiiiiiiiiiiiiiiiiiiiiiiiiiiiiiiiiiiiiiiiiiiiiiiiiiiiiiiiiiiiiiiiiiiiiiiiiiiiiiiiiiiiiiiiiiiiiiiiiiiiiiiiiiiiiiiiiiiiiiiiiiiiiiiiiiiiiiiiiiiiiiiiiiiiiiiiiiiiiiiiiiiiiiiiiiiiiiiiiiiiiiiiiiiiiiiiiiiiiiiiiiiiiiiiiiiiiiiiiiiiiiiiiiiiiiiiiiiiiiiiiiiiiiiiiiiiiiiiiiiiiiiiiiiiiiiiiiiiiiiiiiiiiiiiiiiiiiiiiiiiiiiiiiiiiiiiiiiiiiiiiiiiiiiiiiiiiiiiiiiiiiiiiiiiiiiiiiiiiiiiiiiiiiiiiiiiiiiiiiiiiiiiiiiiiiiiiiiiiiiiiiiiiiiiiiiiiiiiiiiiiiiiiiiiiiiiiiiiiiiiiiiiiiiiiiiiiiiiiiiiiiiiiiiiiiiiiiiiiiiiiiiiiiiiiiiiiiiiiiiiiiiiiiiiiiiiiiiiiiiiiiiiiiiiiiiiiiiiiiiiiiiiiiiiiiiiiiiiiiiiiiiiiiiiiiiiiiiiiiiiiiiiiiiiiiiiiiiiiiiiiiiiiiiiiiiiiiiiiiiiiiiiiiiiiiiiiiiiiiiiiiiiiiiiiiiiiiiiiiiiiiiiiiiiiiiiiiiiiiiiiiiiiiiiiiiiiiiiiiiiiiiiiiiiiiiiiiiiiiiiiiiiiiiiiiiiiiiiiiiiiiiiiiiiiiiiiiiiiiiiiiiiiiiiiiiiiiiiiiiiiiiiiiiiiiiiiiiiiiiiiiiiiiiiiiiiiiiiiiiiiiiiiiiiiiiiiiiiiiiiiiiiiiiiiiiiiiiiiiiiiiiiiiiiiiiiiiiiiiiiiiiiiiiiiiiiiiiiiiiiiiiiiiiiiiiiiiiiiiiiiiiiiiiiiiiiiiiiiiiiiiiiiiiiiiiiiiiiiiiiiiiiiiiiiiiiiiiiiiiiiiiiiiiiiiiiiiiiiiiiiiiiiiiiiiiiiiiiiiiiiiiiiiiiiiiiiiiiiiiiiiiiiiiiiiiiiiiiiiiiiiiiiiiiiiiiiiiiiiiiiiiiiiiiiiiiiiiiiiiiiiiiiiiiiiiiiiiiiiiiiiiiiiiiiiiiiiiiiiiiiiiiiiiiiiiiiiiiiiiiiiiiiiiiiiiiiiiiiiiiiiiiiiiiiiiiiiiiiiiiiiiiiiiiiiiiiiiiiiiiiiiiiiiiiiiiiiiiiiiiiiiiiiiiiiiiiiiiiiiiiiiiiiiiiiiiiiiiiiiiiiiiiiiiiiiiiiiiiiiiiiiiiiiiiiiiiiiiiiiiiiiiiiiiiiiiiiiiiiiiiiiiiiiiiiiiiiiiiiiiiiiiiiiiiiiiiiiiiiiiiiiiiiiiiiiiiiiiiiiiiiiiiiiiiiiiiiiiiiiiiiiiiiiiiiiiiiiiiiiiiiiiiiiiiiiiiiiiiiiiiiiiiiiiiiiiiiiiiiiiiiiiiiiiiiiiiiiiiiiiiiiiiiiiiiiiiiiiiiiiiiiiiiiiiiiiiiiiiiiiiiiiiiiiiiiiiiiiiiiiiiiiiiiiiiiiiiiiiiiiiiiiiiiiiiiiiiiiiiiiiiiiiiiiiiiiiiiiiiiiiiiiiiiiiiiiiiiiiiiiiiiiiiiiiiiiiiiiiiiiiiiiiiiiiiiiiiiiiiiiiiiiiiiiiiiiiiiiiiiiiiiiiiiiiiiiiiiiiiiiiiiiiiiiiiiiiiiiiiiiiiiiiiiiiiiiiiiiiiiiiiiiiiiiiiiiiiiiiiiiiiiiiiiiiiiiiiiiiiiiiiiiiiiiiiiiiiiiiiiiiiiiiiiiiiiiiiiiiiiiiiiiiiiiiiiiiiiiiiiiiiiiiiiiiiiiiiiiiiiiiiiiiiiiiiiiiiiiiiiiiiiiiiiiiiiiiiiiiiiiiiiiiiiiiiiiiiiiiiii iiiiiiiiiiiiiiiiiiiiiiiiiiiiiiiiiiiiiiiiiiiiiiiiiiiiiiiiiiiiiiiiiiiiiiiiiiiiiiiiiiiiiiiiiiiiiiiiiiiiiiiiiiiiiiiiiiiiiiiiiiiiiiiiiiiiiiiiiiiiiiiiiiiiiiiiiiiiiiiiiiiiiiiiiiiiiiiiiiiiiiiiiiiiiiiiiiiiiiiiiiiiiiiiiiiiiiiiiiiiiiiiiiiiiiiiiiiiiiiiiiiiiiiiiiiiiiiiiiiiiiiiiiiiiiiiiiiiiiiiiiiiiiiiiiiiiiiiiiiiiiiiiiiiiiiiiiiiiiiiiiiiiiiiiiiiiiiiiiiiiiiiiiiiiiiiiiiiiiiiiiiiiiiiiiiiiiiiiiiiiiiiiiiiiiiiiiiiiiiiiiiiiiiiiiiiiiiiiiiiiiiiiiiiiiiiiiiiiiiiiiiiiiiiiiiiiiiiiiiiiiiiiiiiiiiiiiiiiiiiiiiiiiiiiiiiiiiiiiiiiiiiiiiiiiiiiiiiiiiiiiiiiiiiiiiiiiiiiiiiiiiiiiiiiiiiiiiiiiiiiiiiiiiiiiiiiiiiiiiiiiiiiiiiiiiiiiiiiiiiiiiiiiiiiiiiiiiiiiiiiiiiiiiiiiiiiiiiiiiiiiiiiiiiiiiiiiiiiiiiiiiiiiiiiiiiiiiiiiiiiiiiiiiiiiiiiiiiiiiiiiiiiiiiiiiiiiiiiiiiiiiiiiiiiiiiiiiiiiiiiiiiiiiiiiiiiiiiiiiiiiiiiiiiiiiiiiiiiiiiiiiiiiiiiiiiiiiiiiiiiiiiiiiiiiiiiiiiiiiiiiiiiiiiiiiiiiiiiiiiiiiiiiiiiiiiiiiiiiiiiiiiiiiiiiiiiiiiiiiiiiiiiiiiiiiiiiiiiiiiiiiiiiiiiiiiiiiiiiiiiiiiiiiiiiiiiiiiiiiiiiiiiiiiiiiiiiiiiiiiiiiiiiiiiiiiiiiiiiiiiiiiiiiiiiiiiiiiiiiiiiiiiiiiiiiiiiiiiiiiiiiiiiiiiiiiiiiiiiiiiiiiiiiiiiiiiiiiiiiiiiiiiiiiiiiiiiiiiiiiiiiiiiiiiiiiiiiiiiiiiiiiiiiiiiiiiiiiiiiiiiiiiiiiiiiiiiiiiiiiiiiiiiiiiiiiiiiiiiiiiiiiiiiiiiiiiiiiiiiiiiiiiiiiiiiiiiiiiiiiiiiiiiiiiiiiiiiiiiiiiiiiiiiiiiiiiiiiiiiiiiiiiiiiiiiiiiiiiiiiiiiiiiiiiiiiiiiiiiiiiiiiiiiiiiiiiiiiiiiiiiiiiiiiiiiiiiiiiiiiiiiiiiiiiiiiiiiiiiiiiiiiiiiiiiiiiiiiiiiiiiiiiiiiiiiiiiiiiiiiiiiiiiiiiiiiiiiiiiiiiiiiiiiiiiiiiiiiiiiiiiiiiiiiiiiiiiiiiiiiiiiiiiiiiiiiiiiiiiiiiiiiiiiiiiiiiiiiiiiiiiiiiiiiiiiiiiiiiiiiiiiiiiiiiiiiiiiiiiiiiiiiiiiiiiiiiiiiiiiiiiiiiiiiiiiiiiiiiiiiiiiiiiiiiiiiiiiiiiiiiiiiiiiiiiiiiiiiiiiiiiiiiiiiiiiiiiiiiiiiiiiiiiiiiiiiiiiiiiiiiiiiiiiiiiiiiiiiiiiiiiiiiiiiiiiiiiiiiiiiiiiiiiiiiiiiiiiiiiiiiiiiiiiiiiiiiiiiiiiiiiiiiiiiiiiiiiiiiiiiiiiiiiiiiiiiiiiiiiiiiiiiiiiiiiiiiiiiiiiiiiiiiiiiiiiiiiiiiiiiiiiiiiiiiiiiiiiiiiiiiiiiiiiiiiiiiiiiiiiiiiiiiiiiiiiiiiiiiiiiiiiiiiiiiiiiiiiiiiiiiiiiiiiiiiiiiiiiiiiiiiiiiiiiiiiiiiiiiiiiiiiiiiiiiiiiiiiiiiiiiiiiiiiiii iiiiiiiiiiiiiiiiiiiiiiiiiiiiiiiiiiiiiiiiiiiiiiiiiiiiiiiiiiiiiiiiiiiiiiiiiiiiiiiiiiiiiiiiiiiiiiiiiiiiiiiiiiiiiiiiiiiiiiiiiiiiiiiiiiiiiiiiiiiiiiiiiiiiiiiiiiiiiiiiiiiiiiiiiiiiiiiiiiiiiiiiiiiiiiiiiiiiiiiiiiiiiiiiiiiiiiiiiiiiiiiiiiiiiiiiiiiiiiiiiiiiiiiiiiiiiiiiiiiiiiiiiiiiiiiiiiiiiiiiiiiiiiiiiiiiiiiiiiiiiiiiiiiiiiiiiiiiiiiiiiiiiiiiiiiiiiiiiiiiiiiiiiiiiiiiiiiiiiiiiiiiiiiiiiiiiiiiiiiiiiiiiiiiiiiiiiiiiiiiiiiiiiiiiiiiiiiiiiiiiiiiiiiiiiiiiiiiiiiiiiiiiiiiiiiiiiiiiiiiiiiiiiiiiiiiiiiiiiiiiiiiiiiiiiiiiiiiiiiiiiiiiiiiiiiiiiiiiiiiiiiiiiiiiiiiiiiiiiiiiiiiiiiiiiiiiiiiiiiiiiiiiiiiiiiiiiiiiiiiiiiiiiiiiiiiiiiiiiiiiiiiiiiiiiiiiiiiiiiiiiiiiiiiiiiiiiiiiiiiiiiiiiiiiiiiiiiiiiiiiiiiiiiiiiiiiiiiiiiiiiiiiiiiiiiiiiiiiiiiiiiiiiiiiiiiiiiiiiiiiiiiiiiiiiiiiiiiiiiiiiiiiiiiiiiiiiiiiiiiiiiiiiiiiiiiiiiiiiiiiiiiiiiiiiiiiiiiiiiiiiiiiiiiiiiiiiiiiiiiiiiiiiiiiiiiiiiiiiiiiiiiiiiiiiiiiiiiiiiiiiiiiiiiiiiiiiiiiiiiiiiiiiiiiiiiiiiiiiiiiiiiiiiiiiiiiiiiiiiiiiiiiiiiiiiiiiiiiiiiiiiiiiiiiiiiiiiiiiiiiiiiiiiiiiiiiiiiiiiiiiiiiiiiiiiiiiiiiiiiiiiiiiiiiiiiiiiiiiiiiiiiiiiiiiiiiiiiiiiiiiiiiiiiiiiiiiiiiiiiiiiiiiiiiiiiiiiiiiiiiiiiiiiiiiiiiiiiiiiiiiiiiiiiiiiiiiiiiiiiiiiiiiiiiiiiiiiiiiiiiiiiiiiiiiiiiiiiiiiiiiiiiiiiiiiiiiiiiiiiiiiiiiiiiiiiiiiiiiiiiiiiiiiiiiiiiiiiiiiiiiiiiiiiiiiiiiiiiiiiiiiiiiiiiiiiiiiiiiiiiiiiiiiiiiiiiiiiiiiiiiiiiiiiiiiiiiiiiiiiiiiiiiiiiiiiiiiiiiiiiiiiiiiiiiiiiiiiiiiiiiiiiiiiiiiiiiiiiiiiiiiiiiiiiiiiiiiiiiiiiiiiiiiiiiiiiiiiiiiiiiiiiiiiiiiiiiiiiiiiiiiiiiiiiiiiiiiiiiiiiiiiiiiiiiiiiiiiiiiiiiiiiiiiiiiiiiiiiiiiiiiiiiiiiiiiiiiiiiiiiiiiiiiiiiiiiiiiiiiiiiiiiiiiiiiiiiiiiiiiiiiiiiiiiiiiiiiiiiiiiiiiiiiiiiiiiiiiiiiiiiiiiiiiiiiiiiiiiiiiiiiiiiiiiiiiiiiiiiiiiiiiiiiiiiiiiiiiiiiiiiiiiiiiiiiiiiiiiiiiiiiiiiiiiiiiiiiiiiiiiiiiiiiiiiiiiiiiiiiiiiiiiiiiiiiiiiiiiiiiiiiiiiiiiiiiiiiiiiiiiiiiiiiiiiiiiiiiiiiiiiiiiiiiiiiiiiiiiiiiiiiiiiiiiiiiiiiiiiiiiiiiiiiiiiiiiiiiiiiiiiiiiiiiiiiiiiiiiiiiiiiiiiiiiiiiiiiiiiiiiiiiiiiiiiiiiiiiiiiiiiiiiiiiiiiiiiiiiiiiiiiiiiiiiiiiiiiiiiiiiiiiiiiiiiiiiiiiiiiiiiiiiiiiii iiiiiiiiiiiiiiiiiiiiiiiiiiiiiiiiiiiiiiiiiiiiiiiiiiiiiiiiiiiiiiiiiiiiiiiiiiiiiiiiiiiiiiiiiiiiiiiiiiiiiiiiiiiiiiiiiiiiiiiiiiiiiiiiiiiiiiiiiiiiiiiiiiiiiiiiiiiiiiiiiiiiiiiiiiiiiiiiiiiiiiiiiiiiiiiiiiiiiiiiiiiiiiiiiiiiiiiiiiiiiiiiiiiiiiiiiiiiiiiiiiiiiiiiiiiiiiiiiiiiiiiiiiiiiiiiiiiiiiiiiiiiiiiiiiiiiiiiiiiiiiiiiiiiiiiiiiiiiiiiiiiiiiiiiiiiiiiiiiiiiiiiiiiiiiiiiiiiiiiiiiiiiiiiiiiiiiiiiiiiiiiiiiiiiiiiiiiiiiiiiiiiiiiiiiiiiiiiiiiiiiiiiiiiiiiiiiiiiiiiiiiiiiiiiiiiiiiiiiiiiiiiiiiiiiiiiiiiiiiiiiiiiiiiiiiiiiiiiiiiiiiiiiiiiiiiiiiiiiiiiiiiiiiiiiiiiiiiiiiiiiiiiiiiiiiiiiiiiiiiiiiiiiiiiiiiiiiiiiiiiiiiiiiiiiiiiiiiiiiiiiiiiiiiiiiiiiiiiiiiiiiiiiiiiiiiiiiiiiiiiiiiiiiiiiiiiiiiiiiiiiiiiiiiiiiiiiiiiiiiiiiiiiiiiiiiiiiiiiiiiiiiiiiiiiiiiiiiiiiiiiiiiiiiiiiiiiiiiiiiiiiiiiiiiiiiiiiiiiiiiiiiiiiiiiiiiiiiiiiiiiiiiiiiiiiiiiiiiiiiiiiiiiiiiiiiiiiiiiiiiiiiiiiiiiiiiiiiiiiiiiiiiiiiiiiiiiiiiiiiiiiiiiiiiiiiiiiiiiiiiiiiiiiiiiiiiiiiiiiiiiiiiiiiiiiiiiiiiiiiiiiiiiiiiiiiiiiiiiiiiiiiiiiiiiiiiiiiiiiiiiiiiiiiiiiiiiiiiiiiiiiiiiiiiiiiiiiiiiiiiiiiiiiiiiiiiiiiiiiiiiiiiiiiiiiiiiiiiiiiiiiiiiiiiiiiiiiiiiiiiiiiiiiiiiiiiiiiiiiiiiiiiiiiiiiiiiiiiiiiiiiiiiiiiiiiiiiiiiiiiiiiiiiiiiiiiiiiiiiiiiiiiiiiiiiiiiiiiiiiiiiiiiiiiiiiiiiiiiiiiiiiiiiiiiiiiiiiiiiiiiiiiiiiiiiiiiiiiiiiiiiiiiiiiiiiiiiiiiiiiiiiiiiiiiiiiiiiiiiiiiiiiiiiiiiiiiiiiiiiiiiiiiiiiiiiiiiiiiiiiiiiiiiiiiiiiiiiiiiiiiiiiiiiiiiiiiiiiiiiiiiiiiiiiiiiiiiiiiiiiiiiiiiiiiiiiiiiiiiiiiiiiiiiiiiiiiiiiiiiiiiiiiiiiiiiiiiiiiiiiiiiiiiiiiiiiiiiiiiiiiiiiiiiiiiiiiiiiiiiiiiiiiiiiiiiiiiiiiiiiiiiiiiiiiiiiiiiiiiiiiiiiiiiiiiiiiiiiiiiiiiiiiiiiiiiiiiiiiiiiiiiiiiiiiiiiiiiiiiiiiiiiiiiiiiiiiiiiiiiiiiiiiiiiiiiiiiiiiiiiiiiiiiiiiiiiiiiiiiiiiiiiiiiiiiiiiiiiiiiiiiiiiiiiiiiiiiiiiiiiiiiiiiiiiiiiiiiiiiiiiiiiiiiiiiiiiiiiiiiiiiiiiiiiiiiiiiiiiiiiiiiiiiiiiiiiiiiiiiiiiiiiiiiiiiiiiiiiiiiiiiiiiiiiiiiiiiiiiiiiiiiiiiiiiiiiiiiiiiiiiiiiiiiiiiiiiiiiiiiiiiiiiiiiiiiiiiiiiiiiiiiiiiiiiiiiiiiiiiiiiiiiiiiiiiiiiiiiiiiiiiiiiiiiiiiiiiiiiiiiiiiiiiiiiiiiiiiiiiiiiiiiiiiiiiiiiiiiiiiiiiiiiiiii iiiiiiiiiiiiiiiiiiiiiiiiiiiiiiiiiiiiiiiiiiiiiiiiiiiiiiiiiiiiiiiiiiiiiiiiiiiiiiiiiiiiiiiiiiiiiiiiiiiiiiiiiiiiiiiiiiiiiiiiiiiiiiiiiiiiiiiiiiiiiiiiiiiiiiiiiiiiiiiiiiiiiii  Pulmonary:     Effort: Pulmonary effort is normal.  Skin:    General: Skin is warm and dry.  Neurological:     Mental Status: He is alert and oriented to person, place, and time.     Motor: Weakness present.     Gait: Gait abnormal.  Psychiatric:        Mood and Affect: Mood normal.        Behavior: Behavior normal.        Thought Content: Thought content normal.        Judgment: Judgment normal.    BP (!) 140/69 (BP Location: Right Arm)   Pulse 78   Resp 18   Ht 5' 10" (1.778 m)   Wt (!) 307 lb 6.4 oz (139.4 kg)   BMI 44.11 kg/m   Past Medical History:  Diagnosis Date  . (HFpEF) heart failure with preserved ejection fraction (HCC)    a.) TTE 11/26/2021: EF 45%, mod LVH, post HK, mod LAE, mild MR/TR, G2DD; b.) TTE 02/26/2022: EF 55-60%, BAE, triv MR, G2DD; c.) TTE 07/09/2022: EF 45%, post HK, LVH, mod LAE, triv TR/PR, mild MR, G1DD  . Anemia of chronic renal failure   . Aortic atherosclerosis (HCC)   . Asthma   . Bell's palsy   . BPH (benign prostatic hyperplasia)   . CAD (coronary artery disease)    a.) LHC 12/31/2021: 60% mLAD, 20% pRCA, 20% dRCA, 20% o-pLCx --> med mgmt.  . CKD (chronic kidney disease), stage IV (HCC)   . Diabetic neuropathy (HCC)   . Dyspnea on exertion   . Ganglion cyst of wrist, right    a.) s/p excision 04/2011  . Hepatic steatosis   . History of 2019 novel coronavirus disease (COVID-19)   . History of bilateral cataract extraction 2018  . Hyperlipidemia   . Hyperparathyroidism due to renal insufficiency (HCC)   . Hypertension   . Low testosterone in male   . Nephrolithiasis   . Obesity   . OSA treated with BiPAP   . Osteoarthritis   . Peripheral edema   . Right inguinal hernia    a.) s/p repair  . Skin cancer   . Type 2 diabetes mellitus with renal manifestations (HCC)      Social History   Socioeconomic History  . Marital status: Married    Spouse name: Not on file  . Number of children: Not on file  . Years of education: Not on file  . Highest education level: Not on file  Occupational History  . Not on file  Tobacco Use  . Smoking status: Never    Passive exposure: Never  . Smokeless tobacco: Never  Vaping Use  .   Vaping Use: Never used  Substance and Sexual Activity  . Alcohol use: No  . Drug use: Not Currently  . Sexual activity: Yes    Birth control/protection: None  Other Topics Concern  . Not on file  Social History Narrative  . Not on file   Social Determinants of Health   Financial Resource Strain: Not on file  Food Insecurity: No Food Insecurity (12/13/2022)   Hunger Vital Sign   . Worried About Running Out of Food in the Last Year: Never true   . Ran Out of Food in the Last Year: Never true  Transportation Needs: No Transportation Needs (12/13/2022)   PRAPARE - Transportation   . Lack of Transportation (Medical): No   . Lack of Transportation (Non-Medical): No  Physical Activity: Not on file  Stress: Not on file  Social Connections: Not on file  Intimate Partner Violence: Not At Risk (12/13/2022)   Humiliation, Afraid, Rape, and Kick questionnaire   . Fear of Current or Ex-Partner: No   . Emotionally Abused: No   . Physically Abused: No   . Sexually Abused: No    Past Surgical History:  Procedure Laterality Date  . APPENDECTOMY    . AV FISTULA PLACEMENT Left 09/19/2022   Procedure: ARTERIOVENOUS (AV) FISTULA CREATION (RADIO CEPHALIC);  Surgeon: Dew, Jason S, MD;  Location: [facility];  Service: Vascular;  Laterality: Left;  . CATARACT EXTRACTION W/PHACO Left 05/20/2017   Procedure: CATARACT EXTRACTION PHACO AND INTRAOCULAR LENS PLACEMENT (IOC);  Surgeon: Porfilio, William, MD;  Location: [facility];  Service: Ophthalmology;  Laterality: Left;  US 00:32.0 AP% 15.5 CDE 4.97 Fluid Pack Lot # [id number]  . CATARACT EXTRACTION  W/PHACO Right 06/10/2017   Procedure: CATARACT EXTRACTION PHACO AND INTRAOCULAR LENS PLACEMENT (IOC);  Surgeon: Porfilio, William, MD;  Location: [facility];  Service: Ophthalmology;  Laterality: Right;  US  00:51 AP% 15.4 CDE 7.95 Fluid pack lot # [id number]  . CHOLECYSTECTOMY    . COLONOSCOPY     x3  . CYSTOSCOPY W/ RETROGRADES Right 08/13/2019   Procedure: CYSTOSCOPY WITH RETROGRADE PYELOGRAM;  Surgeon: Sninsky, Brian C, MD;  Location: [facility];  Service: Urology;  Laterality: Right;  . CYSTOSCOPY WITH STENT PLACEMENT Right 07/26/2019   Procedure: CYSTOSCOPY WITH STENT PLACEMENT;  Surgeon: Bell, Eugene D III, MD;  Location: [facility];  Service: Urology;  Laterality: Right;  . CYSTOSCOPY/URETEROSCOPY/HOLMIUM LASER/STENT PLACEMENT Right 08/13/2019   Procedure: CYSTOSCOPY/URETEROSCOPY/HOLMIUM LASER/STENT EXCHANGE;  Surgeon: Sninsky, Brian C, MD;  Location: [facility];  Service: Urology;  Laterality: Right;  . CYSTOSCOPY/URETEROSCOPY/HOLMIUM LASER/STENT PLACEMENT Left 02/27/2022   Procedure: CYSTOSCOPY/URETEROSCOPY/HOLMIUM LASER/STENT PLACEMENT;  Surgeon: Stoioff, Scott C, MD;  Location: [facility];  Service: Urology;  Laterality: Left;  . GANGLION CYST EXCISION Right   . INGUINAL HERNIA REPAIR Right   . LEFT HEART CATH AND CORONARY ANGIOGRAPHY N/A 12/31/2021   Procedure: LEFT HEART CATH AND CORONARY ANGIOGRAPHY;  Surgeon: Kowalski, Bruce J, MD;  Location: [facility];  Service: Cardiovascular;  Laterality: N/A;  . SHOULDER ARTHROSCOPY WITH SUBACROMIAL DECOMPRESSION AND OPEN ROTATOR C Right 09/11/2020   Procedure: Right shoulder arthroscopic rotator cuff repair vs Regeneten patch application, subacromial decompression, and biceps tenodesis - Todd Mundy to Assist;  Surgeon: Patel, Sunny, MD;  Location: [facility];  Service: Orthopedics;  Laterality: Right;  . TEMPORARY DIALYSIS CATHETER N/A 03/05/2022   Procedure: TEMPORARY DIALYSIS CATHETER;  Surgeon: Schnier, Gregory G, MD;  Location: [facility];  Service: Cardiovascular;  Laterality: N/A;  . TONSILLECTOMY    .   URETEROSCOPY WITH HOLMIUM LASER LITHOTRIPSY Left 02/27/2022   Procedure: URETEROSCOPY WITH HOLMIUM LASER LITHOTRIPSY;  Surgeon: Stoioff, Scott C, MD;  Location: [facility];  Service: Urology;  Laterality: Left;  . WRIST FRACTURE SURGERY Right     Family History  Problem Relation Age of Onset  . Emphysema Mother   . COPD Mother   . Heart disease Mother   . Brain cancer Father     Allergies  Allergen Reactions  . Codeine Nausea And Vomiting  . Doxycycline   . Erythromycin Rash       Latest Ref Rng & Units 12/17/2022    4:22 AM 12/13/2022    2:00 PM 09/19/2022    6:36 AM  CBC  WBC 4.0 - 10.5 K/uL 12.3  14.3    Hemoglobin 13.0 - 17.0 g/dL 9.9  9.9  10.9   Hematocrit 39.0 - 52.0 % 32.2  32.4  32.0   Platelets 150 - 400 K/uL 377  337        CMP     Component Value Date/Time   NA 143 12/17/2022 0422   NA 141 04/10/2022 0000   NA 137 11/27/2013 0412   K 4.5 12/17/2022 0422   K 3.5 11/27/2013 0412   CL 108 12/17/2022 0422   CL 105 11/27/2013 0412   CO2 25 12/17/2022 0422   CO2 25 11/27/2013 0412   GLUCOSE 207 (H) 12/17/2022 0422   GLUCOSE 98 11/27/2013 0412   BUN 80 (H) 12/17/2022 0422   BUN 42 (H) 11/27/2013 0412   CREATININE 3.57 (H) 12/17/2022 0422   CREATININE 2.14 (H) 11/27/2013 0412   CALCIUM 8.5 (L) 12/17/2022 0422   CALCIUM 7.6 (L) 11/27/2013 0412   PROT 7.0 02/26/2022 0948   PROT 7.4 11/27/2013 0412   ALBUMIN 2.8 (L) 12/13/2022 1025   ALBUMIN 3.1 (L) 11/27/2013 0412   AST 21 02/26/2022 0948   AST 47 (H) 11/27/2013 0412   ALT 15 02/26/2022 0948   ALT 41 11/27/2013 0412   ALKPHOS 69 02/26/2022 0948   ALKPHOS 82 11/27/2013 0412   BILITOT 0.7 02/26/2022 0948   BILITOT 0.4 11/27/2013 0412   GFRNONAA 16 (L) 12/17/2022 0422   GFRNONAA 30 (L) 11/27/2013 0412   GFRAA 30 (L) 03/13/2020 1531   GFRAA 34 (L) 11/27/2013 0412     No results found.     Assessment & Plan:   1.  Chronic kidney disease (CKD), stage IV (severe) (HCC) Today the patient has a good thrill.  Previous study showed an acceptable flow volume.  However the more proximal the fistula is palpated it becomes very difficult to palpate.  It is evident that this still needs some assistance with maturation.  Will plan on having the patient undergo fistulogram in order to assist with maturation.  We will also reach out to the patient's nephrologist in order to determine if he will need a PermCath while he is having his procedure done  2. Hyperlipidemia, unspecified hyperlipidemia type Continue statin as ordered and reviewed, no changes at this time  3. Primary hypertension Continue antihypertensive medications as already ordered, these medications have been reviewed and there are no changes at this time.   Current Outpatient Medications on File Prior to Visit  Medication Sig Dispense Refill  . acetaminophen (TYLENOL) 500 MG tablet Take 1,000 mg by mouth every 6 (six) hours as needed (shoulder pain).    . albuterol (PROVENTIL HFA;VENTOLIN HFA) 108 (90 Base) MCG/ACT inhaler Inhale 2 puffs into the lungs every 6 (  six) hours as needed for wheezing or shortness of breath.    . amLODipine (NORVASC) 5 MG tablet Take 1 tablet (5 mg total) by mouth daily. 30 tablet 0  . aspirin EC 81 MG tablet Take by mouth.    . calcitRIOL (ROCALTROL) 0.25 MCG capsule Take 0.25 mcg by mouth daily.    . cetirizine (ZYRTEC) 10 MG chewable tablet Chew 10 mg by mouth daily as needed for allergies.    . Continuous Blood Gluc Sensor (FREESTYLE LIBRE 14 DAY SENSOR) MISC by Does not apply route.    . fluticasone (FLONASE) 50 MCG/ACT nasal spray as needed.    . furosemide (LASIX) 40 MG tablet Take 80 mg q am and 40 mg qpm 60 tablet 2  . GLOBAL INJECT EASE INSULIN SYR 31G X 5/16" 0.5 ML MISC Inject into the skin.    . glucose blood (ONETOUCH ULTRA) test strip TEST BLOOD SUGAR 4 TIMES DAILY    . insulin aspart (NOVOLOG) 100 UNIT/ML  injection Inject 14 Units into the skin 3 (three) times daily with meals. (Patient taking differently: Inject 45 Units into the skin 3 (three) times daily with meals. Pt states he takes 45units in am 30 units at lunch and 45 units at pm) 10 mL 11  . insulin detemir (LEVEMIR) 100 UNIT/ML injection Inject 0.5 mLs (50 Units total) into the skin at bedtime. 10 mL 1  . isosorbide mononitrate (IMDUR) 30 MG 24 hr tablet Take 1 tablet (30 mg total) by mouth daily. 30 tablet 2  . Lancets (ONETOUCH DELICA PLUS LANCET30G) MISC USE 1 LANCET 4 TIMES DAILY    . lovastatin (MEVACOR) 40 MG tablet Take 40 mg by mouth daily with supper.    . metoprolol succinate (TOPROL-XL) 50 MG 24 hr tablet Take 50 mg by mouth daily.    . nitroGLYCERIN (NITROSTAT) 0.4 MG SL tablet Place 1 tablet (0.4 mg total) under the tongue every 5 (five) minutes as needed for chest pain. 20 tablet 1  . potassium citrate (UROCIT-K) 10 MEQ (1080 MG) SR tablet TAKE (1) TABLET BY MOUTH TWICE DAILY WITH A MEAL. 60 tablet 11  . tamsulosin (FLOMAX) 0.4 MG CAPS capsule TAKE (1) CAPSULE BY MOUTH EVERY DAY 30 capsule 1   No current facility-administered medications on file prior to visit.    There are no Patient Instructions on file for this visit. No follow-ups on file.   Dan Scearce E Doniqua Saxby, NP   

## 2022-12-20 NOTE — Progress Notes (Signed)
Subjective:    Patient ID: James Clarke, male    DOB: June 12, 1941, 82 y.o.   MRN: [id number] Chief Complaint  Patient presents with  . Follow-up          HPI  Review of Systems  All other systems reviewed and are negative.      Objective:   Physical Exam Vitals reviewed.  HENT:     Head: Normocephalic.  Cardiovascular:     Rate and Rhythm: Normal rate.     Arteriovenous access: Left arteriovenous access is present.    Comments: Good bruit and proximal  iiiiiiiiiiiiiiiiiiiiiiiiiiiiiiiiiiiiiiiiiiiiiiiiiiiiiiiiiiiiiiiiiiiiiiiiiiiiiiiiiiiiiiiiiiiiiiiiiiiiiiiiiiiiiiiiiiiiiiiiiiiiiiiiiiiiiiiiiiiiiiiiiiiiiiiiiiiiiiiiiiiiiiiiiiiiiiiiiiiiiiiiiiiiiiiiiiiiiiiiiiiiiiiiiiiiiiiiiiiiiiiiiiiiiiiiiiiiiiiiiiiiiiiiiiiiiiiiiiiiiiiiiiiiiiiiiiiiiiiiiiiiiiiiiiiiiiiiiiiiiiiiiiiiiiiiiiiiiiiiiiiiiiiiiiiiiiiiiiiiiiiiiiiiiiiiiiiiiiiiiiiiiiiiiiiiiiiiiiiiiiiiiiiiiiiiiiiiiiiiiiiiiiiiiiiiiiiiiiiiiiiiiiiiiiiiiiiiiiiiiiiiiiiiiiiiiiiiiiiiiiiiiiiiiiiiiiiiiiiiiiiiiiiiiiiiiiiiiiiiiiiiiiiiiiiiiiiiiiiiiiiiiiiiiiiiiiiiiiiiiiiiiiiiiiiiiiiiiiiiiiiiiiiiiiiiiiiiiiiiiiiiiiiiiiiiiiiiiiiiiiiiiiiiiiiiiiiiiiiiiiiiiiiiiiiiiiiiiiiiiiiiiiiiiiiiiiiiiiiiiiiiiiiiiiiiiiiiiiiiiiiiiiiiiiiiiiiiiiiiiiiiiiiiiiiiiiiiiiiiiiiiiiiiiiiiiiiiiiiiiiiiiiiiiiiiiiiiiiiiiiiiiiiiiiiiiiiiiiiiiiiiiiiiiiiiiiiiiiiiiiiiiiiiiiiiiiiiiiiiiiiiiiiiiiiiiiiiiiiiiiiiiiiiiiiiiiiiiiiiiiiiiiiiiiiiiiiiiiiiiiiiiiiiiiiiiiiiiiiiiiiiiiiiiiiiiiiiiiiiiiiiiiiiiiiiiiiiiiiiiiiiiiiiiiiiiiiiiiiiiiiiiiiiiiiiiiiiiiiiiiiiiiiiiiiiiiiiiiiiiiiiiiiiiiiiiiiiiiiiiiiiiiiiiiiiiiiiiiiiiiiiiiiiiiiiiiiiiiiiiiiiiiiiiiiiiiiiiiiiiiiiiiiiiiiiiiiiiiiiiiiiiiiiiiiiiiiiiiiiiiiiiiiiiiiiiiiiiiiiiiiiiiiiiiiiiiiiiiiiiiiiiiiiiiiiiiiiiiiiiiiiiiiiiiiiiiiiiiiiiiiiiiiiiiiiiiiiiiiiiiiiiiiiiiiiiiiiiiiiiiiiiiiiiiiiiiiiiiiiiiiiiiiiiiiiiiiiiiiiiiiiiiiiiiiiiiiiiiiiiiiiiiiiiiiiiiiiiiiiiiiiiiiiiiiiiiiiiiiiiiiiiiiiiiiiiiiiiiiiiiiiiiiiiiiiiiiiiiiiiiiiiiiiiiiiiiiiiiiiiiiiiiiiiiiiiiiiiiiiiiiiiiiiiiiiiiiiiiiiiiiiiiiiiiiiiiiiiiiiiiiiiiiiiiiiiiiiiiiiiiiiiiiiiiiiiiiiiiiiiiiiiiiiiiiiiiiiiiiiiiiiiiiiiiiiiiiiiiiiiiiiiiiiiiiiiiiiiiiiiiiiiiiiiiiiiiiiiiiiiiiiiiiiiiiiiiiiiiiiiiiiiiiiiiiiiiiiiiiiiiiiiiiiiiiiiiiiiiiiiiiiiiiiiiiiiiiiiiiiiiiiiiiiiiiiiiiiiiiiiiiiiiiiiiiiiiiiiiiiiiiiiiiiiiiiiiiiiiiiiiiiiiiiiiiiiiiiiiiiiiiiiiiiiiiiiiiiiiiiiiiiiiiiiiiiiiiiiiiiiiiiiiiiiiiiiiiiiiiiiiiiiiiiiiiiiiiiiiiiiiiiiiiiiiiiiiiiiiiiiiiiiiiiiiiiiiiiiiiiiiiiiiiiiiiiiiiiiiiiiiiiiiiiiiiiiiiiiiiiiiiiiiiiiiiiiiiiiiiiiiiiiiiiiiiiiiiiiiiiiiiiiiiiii iiiiiiiiiiiiiiiiiiiiiiiiiiiiiiiiiiiiiiiiiiiiiiiiiiiiiiiiiiiiiiiiiiiiiiiiiiiiiiiiiiiiiiiiiiiiiiiiiiiiiiiiiiiiiiiiiiiiiiiiiiiiiiiiiiiiiiiiiiiiiiiiiiiiiiiiiiiiiiiiiiiiiiiiiiiiiiiiiiiiiiiiiiiiiiiiiiiiiiiiiiiiiiiiiiiiiiiiiiiiiiiiiiiiiiiiiiiiiiiiiiiiiiiiiiiiiiiiiiiiiiiiiiiiiiiiiiiiiiiiiiiiiiiiiiiiiiiiiiiiiiiiiiiiiiiiiiiiiiiiiiiiiiiiiiiiiiiiiiiiiiiiiiiiiiiiiiiiiiiiiiiiiiiiiiiiiiiiiiiiiiiiiiiiiiiiiiiiiiiiiiiiiiiiiiiiiiiiiiiiiiiiiiiiiiiiiiiiiiiiiiiiiiiiiiiiiiiiiiiiiiiiiiiiiiiiiiiiiiiiiiiiiiiiiiiiiiiiiiiiiiiiiiiiiiiiiiiiiiiiiiiiiiiiiiiiiiiiiiiiiiiiiiiiiiiiiiiiiiiiiiiiiiiiiiiiiiiiiiiiiiiiiiiiiiiiiiiiiiiiiiiiiiiiiiiiiiiiiiiiiiiiiiiiiiiiiiiiiiiiiiiiiiiiiiiiiiiiiiiiiiiiiiiiiiiiiiiiiiiiiiiiiiiiiiiiiiiiiiiiiiiiiiiiiiiiiiiiiiiiiiiiiiiiiiiiiiiiiiiiiiiiiiiiiiiiiiiiiiiiiiiiiiiiiiiiiiiiiiiiiiiiiiiiiiiiiiiiiiiiiiiiiiiiiiiiiiiiiiiiiiiiiiiiiiiiiiiiiiiiiiiiiiiiiiiiiiiiiiiiiiiiiiiiiiiiiiiiiiiiiiiiiiiiiiiiiiiiiiiiiiiiiiiiiiiiiiiiiiiiiiiiiiiiiiiiiiiiiiiiiiiiiiiiiiiiiiiiiiiiiiiiiiiiiiiiiiiiiiiiiiiiiiiiiiiiiiiiiiiiiiiiiiiiiiiiiiiiiiiiiiiiiiiiiiiiiiiiiiiiiiiiiiiiiiiiiiiiiiiiiiiiiiiiiiiiiiiiiiiiiiiiiiiiiiiiiiiiiiiiiiiiiiiiiiiiiiiiiiiiiiiiiiiiiiiiiiiiiiiiiiiiiiiiiiiiiiiiiiiiiiiiiiiiiiiiiiiiiiiiiiiiiiiiiiiiiiiiiiiiiiiiiiiiiiiiiiiiiiiiiiiiiiiiiiiiiiiiiiiiiiiiiiiiiiiiiiiiiiiiiiiiiiiiiiiiiiiiiiiiiiiiiiiiiiiiiiiiiiiiiiiiiiiiiiiiiiiiiiiiiiiiiiiiiiiiiiiiiiiiiiiiiiiiiiiiiiiiiiiiiiiiiiiiiiiiiiiiiiiiiiiiiiiiiiiiiiiiiiiiiiiiiiiiiiiiiiiiiiiiiiiiiiiiiiiiiiiiiiiiiiiiiiiiiiiiiiiiiiiiiiiiiiiiiiiiiiiiiiiiiiiiiiiiiiiiiiiiiiiiiiiiiiiiiiiiiiiiiiiiiiiiiiiiiiiiiiiiiiiiiiiiiiiiiiiiiiiiiiiiiiiiiiiiiiiiiiiiiiiiiiiiiiiiiiiiiiiiiiiiiiiiiiiiiiiiiiiiiiiiiiiiiiiiiiiiiiiiiiiiiiiiiiiiiiiiiiiiiiiiiiiiiiiiiiiiiiiiiiiiiiiiiiiiiiiiiiiiiiiiiiiiiiiiiiiiiiiiiiiiiiiiiiiiiiiiiiiiiiiiiiiiiiiiiiiiiiiiiiiiiiiiiiiiiiiiiiiiiiiiiiiiiiiiiiiiiiiiiiiiiiiiiiiiiiiiiiiiiiiiiiiiiiiiiiiiiiiiiiiiiiiiiiiiiiiiiiiiiiiiiiiiiiiiiiiiiiiiiiiiiiiiiiiiiiiiiiiiiiiiiiiiiiiiiiiiiiiiiiiiiiiiiiiiiiiiiiiiiiiiiiiiiiiiii iiiiiiiiiiiiiiiiiiiiiiiiiiiiiiiiiiiiiiiiiiiiiiiiiiiiiiiiiiiiiiiiiiiiiiiiiiiiiiiiiiiiiiiiiiiiiiiiiiiiiiiiiiiiiiiiiiiiiiiiiiiiiiiiiiiiiiiiiiiiiiiiiiiiiiiiiiiiiiiiiiiiiiiiiiiiiiiiiiiiiiiiiiiiiiiiiiiiiiiiiiiiiiiiiiiiiiiiiiiiiiiiiiiiiiiiiiiiiiiiiiiiiiiiiiiiiiiiiiiiiiiiiiiiiiiiiiiiiiiiiiiiiiiiiiiiiiiiiiiiiiiiiiiiiiiiiiiiiiiiiiiiiiiiiiiiiiiiiiiiiiiiiiiiiiiiiiiiiiiiiiiiiiiiiiiiiiiiiiiiiiiiiiiiiiiiiiiiiiiiiiiiiiiiiiiiiiiiiiiiiiiiiiiiiiiiiiiiiiiiiiiiiiiiiiiiiiiiiiiiiiiiiiiiiiiiiiiiiiiiiiiiiiiiiiiiiiiiiiiiiiiiiiiiiiiiiiiiiiiiiiiiiiiiiiiiiiiiiiiiiiiiiiiiiiiiiiiiiiiiiiiiiiiiiiiiiiiiiiiiiiiiiiiiiiiiiiiiiiiiiiiiiiiiiiiiiiiiiiiiiiiiiiiiiiiiiiiiiiiiiiiiiiiiiiiiiiiiiiiiiiiiiiiiiiiiiiiiiiiiiiiiiiiiiiiiiiiiiiiiiiiiiiiiiiiiiiiiiiiiiiiiiiiiiiiiiiiiiiiiiiiiiiiiiiiiiiiiiiiiiiiiiiiiiiiiiiiiiiiiiiiiiiiiiiiiiiiiiiiiiiiiiiiiiiiiiiiiiiiiiiiiiiiiiiiiiiiiiiiiiiiiiiiiiiiiiiiiiiiiiiiiiiiiiiiiiiiiiiiiiiiiiiiiiiiiiiiiiiiiiiiiiiiiiiiiiiiiiiiiiiiiiiiiiiiiiiiiiiiiiiiiiiiiiiiiiiiiiiiiiiiiiiiiiiiiiiiiiiiiiiiiiiiiiiiiiiiiiiiiiiiiiiiiiiiiiiiiiiiiiiiiiiiiiiiiiiiiiiiiiiiiiiiiiiiiiiiiiiiiiiiiiiiiiiiiiiiiiiiiiiiiiiiiiiiiiiiiiiiiiiiiiiiiiiiiiiiiiiiiiiiiiiiiiiiiiiiiiiiiiiiiiiiiiiiiiiiiiiiiiiiiiiiiiiiiiiiiiiiiiiiiiiiiiiiiiiiiiiiiiiiiiiiiiiiiiiiiiiiiiiiiiiiiiiiiiiiiiiiiiiiiiiiiiiiiiiiiiiiiiiiiiiiiiiiiiiiiiiiiiiiiiiiiiiiiiiiiiiiiiiiiiiiiiiiiiiiiiiiiiiiiiiiiiiiiiiiiiiiiiiiiiiiiiiiiiiiiiiiiiiiiiiiiiiiiiiiiiiiiiiiiiiiiiiiiiiiiiiiiiiiiiiiiiiiiiiiiiiiiiiiiiiiiiiiiiiiiiiiiiiiiiiiiiiiiiiiiiiiiiiiiiiiiiiiiiiiiiiiiiiiiiiiiiiiiiiiiiiiiiiiiiiiiiiiiiiiiiiiiiiiiiiiiiiiiiiiiiiiiiiiiiiiiiiiiiiiiiiiiiiiiiiiiiiiiiiiiiiiiiiiiiiiiiiiiiiiiiiiiiiiiiiiiiiiiiiiiiiiiiiiiiiiiiiiiiiiiiiiiiiiiiiiiiiiiiiiiiiiiiiiiiiiiiiiiiiiiiiiiiiiiiiiiiiiiiiiiiiiiiiiiiiiiiiiiiiiiiiiiiiiiiiiiiiiiiiiiiiiiiiiiiiiiiiiiiiiiiiiiiiiiiiiiiiiiiiiiiiiiiiiiiiiiiiiiiiiiiiiiiiiiiiiiiiiiiiiiiiiiiiiiiiiiiiiiiiiiiiiiiiiiiiiiiiiiiiiiiiiiiiiiiiiiiiiiiiiiiiiiiiiiiiiiiiiiiiiiiiiiiiiiiiiiiiiiiiiiiiiiiiiiiiiiiiiiiiiiiiiiiiiiiiiiii iiiiiiiiiiiiiiiiiiiiiiiiiiiiiiiiiiiiiiiiiiiiiiiiiiiiiiiiiiiiiiiiiiiiiiiiiiiiiiiiiiiiiiiiiiiiiiiiiiiiiiiiiiiiiiiiiiiiiiiiiiiiiiiiiiiiiiiiiiiiiiiiiiiiiiiiiiiiiiiiiiiiiiiiiiiiiiiiiiiiiiiiiiiiiiiiiiiiiiiiiiiiiiiiiiiiiiiiiiiiiiiiiiiiiiiiiiiiiiiiiiiiiiiiiiiiiiiiiiiiiiiiiiiiiiiiiiiiiiiiiiiiiiiiiiiiiiiiiiiiiiiiiiiiiiiiiiiiiiiiiiiiiiiiiiiiiiiiiiiiiiiiiiiiiiiiiiiiiiiiiiiiiiiiiiiiiiiiiiiiiiiiiiiiiiiiiiiiiiiiiiiiiiiiiiiiiiiiiiiiiiiiiiiiiiiiiiiiiiiiiiiiiiiiiiiiiiiiiiiiiiiiiiiiiiiiiiiiiiiiiiiiiiiiiiiiiiiiiiiiiiiiiiiiiiiiiiiiiiiiiiiiiiiiiiiiiiiiiiiiiiiiiiiiiiiiiiiiiiiiiiiiiiiiiiiiiiiiiiiiiiiiiiiiiiiiiiiiiiiiiiiiiiiiiiiiiiiiiiiiiiiiiiiiiiiiiiiiiiiiiiiiiiiiiiiiiiiiiiiiiiiiiiiiiiiiiiiiiiiiiiiiiiiiiiiiiiiiiiiiiiiiiiiiiiiiiiiiiiiiiiiiiiiiiiiiiiiiiiiiiiiiiiiiiiiiiiiiiiiiiiiiiiiiiiiiiiiiiiiiiiiiiiiiiiiiiiiiiiiiiiiiiiiiiiiiiiiiiiiiiiiiiiiiiiiiiiiiiiiiiiiiiiiiiiiiiiiiiiiiiiiiiiiiiiiiiiiiiiiiiiiiiiiiiiiiiiiiiiiiiiiiiiiiiiiiiiiiiiiiiiiiiiiiiiiiiiiiiiiiiiiiiiiiiiiiiiiiiiiiiiiiiiiiiiiiiiiiiiiiiiiiiiiiiiiiiiiiiiiiiiiiiiiiiiiiiiiiiiiiiiiiiiiiiiiiiiiiiiiiiiiiiiiiiiiiiiiiiiiiiiiiiiiiiiiiiiiiiiiiiiiiiiiiiiiiiiiiiiiiiiiiiiiiiiiiiiiiiiiiiiiiiiiiiiiiiiiiiiiiiiiiiiiiiiiiiiiiiiiiiiiiiiiiiiiiiiiiiiiiiiiiiiiiiiiiiiiiiiiiiiiiiiiiiiiiiiiiiiiiiiiiiiiiiiiiiiiiiiiiiiiiiiiiiiiiiiiiiiiiiiiiiiiiiiiiiiiiiiiiiiiiiiiiiiiiiiiiiiiiiiiiiiiiiiiiiiiiiiiiiiiiiiiiiiiiiiiiiiiiiiiiiiiiiiiiiiiiiiiiiiiiiiiiiiiiiiiiiiiiiiiiiiiiiiiiiiiiiiiiiiiiiiiiiiiiiiiiiiiiiiiiiiiiiiiiiiiiiiiiiiiiiiiiiiiiiiiiiiiiiiiiiiiiiiiiiiiiiiiiiiiiiiiiiiiiiiiiiiiiiiiiiiiiiiiiiiiiiiiiiiiiiiiiiiiiiiiiiiiiiiiiiiiiiiiiiiiiiiiiiiiiiiiiiiiiiiiiiiiiiiiiiiiiiiiiiiiiiiiiiiiiiiiiiiiiiiiiiiiiiiiiiiiiiiiiiiiiiiiiiiiiiiiiiiiiiiiiiiiiiiiiiiiiiiiiiiiiiiiiiiiiiiiiiiiiiiiiiiiiiiiiiiiiiiiiiiiiiiiiiiiiiiiiiiiiiiiiiiiiiiiiiiiiiiiiiiiiiiiiiiiiiiiiiiiiiiiiiiiiiiiiiiiiiiiiiiiiiiiiiiiiiiiiiiiiiiiiiiiiiiiiiiiiiiiiiiiiiiiiiiiiiiiiiiiiiiiiiiiiiiiiiiiiiiiiiiiiiiiiiiiiiiiiiiiiiiiiiiiiiiiiiiiiiiiiiiiiiiiiiiiiiiiiiiiiiiiiiiiiiiiiiiiiii iiiiiiiiiiiiiiiiiiiiiiiiiiiiiiiiiiiiiiiiiiiiiiiiiiiiiiiiiiiiiiiiiiiiiiiiiiiiiiiiiiiiiiiiiiiiiiiiiiiiiiiiiiiiiiiiiiiiiiiiiiiiiiiiiiiiiiiiiiiiiiiiiiiiiiiiiiiiiiiiiiiiiiiiiiiiiiiiiiiiiiiiiiiiiiiiiiiiiiiiiiiiiiiiiiiiiiiiiiiiiiiiiiiiiiiiiiiiiiiiiiiiiiiiiiiiiiiiiiiiiiiiiiiiiiiiiiiiiiiiiiiiiiiiiiiiiiiiiiiiiiiiiiiiiiiiiiiiiiiiiiiiiiiiiiiiiiiiiiiiiiiiiiiiiiiiiiiiiiiiiiiiiiiiiiiiiiiiiiiiiiiiiiiiiiiiiiiiiiiiiiiiiiiiiiiiiiiiiiiiiiiiiiiiiiiiiiiiiiiiiiiiiiiiiiiiiiiiiiiiiiiiiiiiiiiiiiiiiiiiiiiiiiiiiiiiiiiiiiiiiiiiiiiiiiiiiiiiiiiiiiiiiiiiiiiiiiiiiiiiiiiiiiiiiiiiiiiiiiiiiiiiiiiiiiiiiiiiiiiiiiiiiiiiiiiiiiiiiiiiiiiiiiiiiiiiiiiiiiiiiiiiiiiiiiiiiiiiiiiiiiiiiiiiiiiiiiiiiiiiiiiiiiiiiiiiiiiiiiiiiiiiiiiiiiiiiiiiiiiiiiiiiiiiiiiiiiiiiiiiiiiiiiiiiiiiiiiiiiiiiiiiiiiiiiiiiiiiiiiiiiiiiiiiiiiiiiiiiiiiiiiiiiiiiiiiiiiiiiiiiiiiiiiiiiiiiiiiiiiiiiiiiiiiiiiiiiiiiiiiiiiiiiiiiiiiiiiiiiiiiiiiiiiiiiiiiiiiiiiiiiiiiiiiiiiiiiiiiiiiiiiiiiiiiiiiiiiiiiiiiiiiiiiiiiiiiiiiiiiiiiiiiiiiiiiiiiiiiiiiiiiiiiiiiiiiiiiiiiiiiiiiiiiiiiiiiiiiiiiiiiiiiiiiiiiiiiiiiiiiiiiiiiiiiiiiiiiiiiiiiiiiiiiiiiiiiiiiiiiiiiiiiiiiiiiiiiiiiiiiiiiiiiiiiiiiiiiiiiiiiiiiiiiiiiiiiiiiiiiiiiiiiiiiiiiiiiiiiiiiiiiiiiiiiiiiiiiiiiiiiiiiiiiiiiiiiiiiiiiiiiiiiiiiiiiiiiiiiiiiiiiiiiiiiiiiiiiiiiiiiiiiiiiiiiiiiiiiiiiiiiiiiiiiiiiiiiiiiiiiiiiiiiiiiiiiiiiiiiiiiiiiiiiiiiiiiiiiiiiiiiiiiiiiiiiiiiiiiiiiiiiiiiiiiiiiiiiiiiiiiiiiiiiiiiiiiiiiiiiiiiiiiiiiiiiiiiiiiiiiiiiiiiiiiiiiiiiiiiiiiiiiiiiiiiiiiiiiiiiiiiiiiiiiiiiiiiiiiiiiiiiiiiiiiiiiiiiiiiiiiiiiiiiiiiiiiiiiiiiiiiiiiiiiiiiiiiiiiiiiiiiiiiiiiiiiiiiiiiiiiiiiiiiiiiiiiiiiiiiiiiiiiiiiiiiiiiiiiiiiiiiiiiiiiiiiiiiiiiiiiiiiiiiiiiiiiiiiiiiiiiiiiiiiiiiiiiiiiiiiiiiiiiiiiiiiiiiiiiiiiiiiiiiiiiiiiiiiiiiiiiiiiiiiiiiiiiiiiiiiiiiiiiiiiiiiiiiiiiiiiiiiiiiiiiiiiiiiiiiiiiiiiiiiiiiiiiiiiiiiiiiiiiiiiiiiiiiiiiiiiiiiiiiiiiiiiiiiiiiiiiiiiiiiiiiiiiiiiiiiiiiiiiiiiiiiiiiiiiiiiiiiiiiiiiiiiiiiiiiiiiiiiiiiiiiiiiiiiiiiiiiiiiiiiiiiiiiiiiiiiiiiiiiiiiiiiiiiiiiiiiiiiiiiiiiiiiiiiiiiiiiiiiiiiiiiiiiiiiiiiiiiiiiiii iiiiiiiiiiiiiiiiiiiiiiiiiiiiiiiiiiiiiiiiiiiiiiiiiiiiiiiiiiiiiiiiiiiiiiiiiiiiiiiiiiiiiiiiiiiiiiiiiiiiiiiiiiiiiiiiiiiiiiiiiiiiiiiiiiiiiiiiiiiiiiiiiiiiiiiiiiiiiiiiiiiiiiiiiiiiiiiiiiiiiiiiiiiiiiiiiiiiiiiiiiiiiiiiiiiiiiiiiiiiiiiiiiiiiiiiiiiiiiiiiiiiiiiiiiiiiiiiiiiiiiiiiiiiiiiiiiiiiiiiiiiiiiiiiiiiiiiiiiiiiiiiiiiiiiiiiiiiiiiiiiiiiiiiiiiiiiiiiiiiiiiiiiiiiiiiiiiiiiiiiiiiiiiiiiiiiiiiiiiiiiiiiiiiiiiiiiiiiiiiiiiiiiiiiiiiiiiiiiiiiiiiiiiiiiiiiiiiiiiiiiiiiiiiiiiiiiiiiiiiiiiiiiiiiiiiiiiiiiiiiiiiiiiiiiiiiiiiiiiiiiiiiiiiiiiiiiiiiiiiiiiiiiiiiiiiiiiiiiiiiiiiiiiiiiiiiiiiiiiiiiiiiiiiiiiiiiiiiiiiiiiiiiiiiiiiiiiiiiiiiiiiiiiiiiiiiiiiiiiiiiiiiiiiiiiiiiiiiiiiiiiiiiiiiiiiiiiiiiiiiiiiiiiiiiiiiiiiiiiiiiiiiiiiiiiiiiiiiiiiiiiiiiiiiiiiiiiiiiiiiiiiiiiiiiiiiiiiiiiiiiiiiiiiiiiiiiiiiiiiiiiiiiiiiiiiiiiiiiiiiiiiiiiiiiiiiiiiiiiiiiiiiiiiiiiiiiiiiiiiiiiiiiiiiiiiiiiiiiiiiiiiiiiiiiiiiiiiiiiiiiiiiiiiiiiiiiiiiiiiiiiiiiiiiiiiiiiiiiiiiiiiiiiiiiiiiiiiiiiiiiiiiiiiiiiiiiiiiiiiiiiiiiiiiiiiiiiiiiiiiiiiiiiiiiiiiiiiiiiiiiiiiiiiiiiiiiiiiiiiiiiiiiiiiiiiiiiiiiiiiiiiiiiiiiiiiiiiiiiiiiiiiiiiiiiiiiiiiiiiiiiiiiiiiiiiiiiiiiiiiiiiiiiiiiiiiiiiiiiiiiiiiiiiiiiiiiiiiiiiiiiiiiiiiiiiiiiiiiiiiiiiiiiiiiiiiiiiiiiiiiiiiiiiiiiiiiiiiiiiiiiiiiiiiiiiiiiiiiiiiiiiiiiiiiiiiiiiiiiiiiiiiiiiiiiiiiiiiiiiiiiiiiiiiiiiiiiiiiiiiiiiiiiiiiiiiiiiiiiiiiiiiiiiiiiiiiiiiiiiiiiiiiiiiiiiiiiiiiiiiiiiiiiiiiiiiiiiiiiiiiiiiiiiiiiiiiiiiiiiiiiiiiiiiiiiiiiiiiiiiiiiiiiiiiiiiiiiiiiiiiiiiiiiiiiiiiiiiiiiiiiiiiiiiiiiiiiiiiiiiiiiiiiiiiiiiiiiiiiiiiiiiiiiiiiiiiiiiiiiiiiiiiiiiiiiiiiiiiiiiiiiiiiiiiiiiiiiiiiiiiiiiiiiiiiiiiiiiiiiiiiiiiiiiiiiiiiiiiiiiiiiiiiiiiiiiiiiiiiiiiiiiiiiiiiiiiiiiiiiiiiiiiiiiiiiiiiiiiiiiiiiiiiiiiiiiiiiiiiiiiiiiiiiiiiiiiiiiiiiiiiiiiiiiiiiiiiiiiiiiiiiiiiiiiiiiiiiiiiiiiiiiiiiiiiiiiiiiiiiiiiiiiiiiiiiiiiiiiiiiiiiiiiiiiiiiiiiiiiiiiiiiiiiiiiiiiiiiiiiiiiiiiiiiiiiiiiiiiiiiiiiiiiiiiiiiiiiiiiiiiiiiiiiiiiiiiiiiiiiiiiiiiiiiiiiiiiiiiiiiiiiiiiiiiiiiiiiiiiiiiiiiiiiiiiiiiiiiiiiiiiiiiiiiiiiiiiiiiiiiiiiiiiiiiiiiiiiiiiiiiiiiiiiii iiiiiiiiiiiiiiiiiiiiiiiiiiiiiiiiiiiiiiiiiiiiiiiiiiiiiiiiiiiiiiiiiiiiiiiiiiiiiiiiiiiiiiiiiiiiiiiiiiiiiiiiiiiiiiiiiiiiiiiiiiiiiiiiiiiiiiiiiiiiiiiiiiiiiiiiiiiiiiiiiiiiiiiiiiiiiiiiiiiiiiiiiiiiiiiiiiiiiiiiiiiiiiiiiiiiiiiiiiiiiiiiiiiiiiiiiiiiiiiiiiiiiiiiiiiiiiiiiiiiiiiiiiiiiiiiiiiiiiiiiiiiiiiiiiiiiiiiiiiiiiiiiiiiiiiiiiiiiiiiiiiiiiiiiiiiiiiiiiiiiiiiiiiiiiiiiiiiiiiiiiiiiiiiiiiiiiiiiiiiiiiiiiiiiiiiiiiiiiiiiiiiiiiiiiiiiiiiiiiiiiiiiiiiiiiiiiiiiiiiiiiiiiiiiiiiiiiiiiiiiiiiiiiiiiiiiiiiiiiiiiiiiiiiiiiiiiiiiiiiiiiiiiiiiiiiiiiiiiiiiiiiiiiiiiiiiiiiiiiiiiiiiiiiiiiiiiiiiiiiiiiiiiiiiiiiiiiiiiiiiiiiiiiiiiiiiiiiiiiiiiiiiiiiiiiiiiiiiiiiiiiiiiiiiiiiiiiiiiiiiiiiiiiiiiiiiiiiiiiiiiiiiiiiiiiiiiiiiiiiiiiiiiiiiiiiiiiiiiiiiiiiiiiiiiiiiiiiiiiiiiiiiiiiiiiiiiiiiiiiiiiiiiiiiiiiiiiiiiiiiiiiiiiiiiiiiiiiiiiiiiiiiiiiiiiiiiiiiiiiiiiiiiiiiiiiiiiiiiiiiiiiiiiiiiiiiiiiiiiiiiiiiiiiiiiiiiiiiiiiiiiiiiiiiiiiiiiiiiiiiiiiiiiiiiiiiiiiiiiiiiiiiiiiiiiiiiiiiiiiiiiiiiiiiiiiiiiiiiiiiiiiiiiiiiiiiiiiiiiiiiiiiiiiiiiiiiiiiiiiiiiiiiiiiiiiiiiiiiiiiiiiiiiiiiiiiiiiiiiiiiiiiiiiiiiiiiiiiiiiiiiiiiiiiiiiiiiiiiiiiiiiiiiiiiiiiiiiiiiiiiiiiiiiiiiiiiiiiiiiiiiiiiiiiiiiiiiiiiiiiiiiiiiiiiiiiiiiiiiiiiiiiiiiiiiiiiiiiiiiiiiiiiiiiiiiiiiiiiiiiiiiiiiiiiiiiiiiiiiiiiiiiiiiiiiiiiiiiiiiiiiiiiiiiiiiiiiiiiiiiiiiiiiiiiiiiiiiiiiiiiiiiiiiiiiiiiiiiiiiiiiiiiiiiiiiiiiiiiiiiiiiiiiiiiiiiiiiiiiiiiiiiiiiiiiiiiiiiiiiiiiiiiiiiiiiiiiiiiiiiiiiiiiiiiiiiiiiiiiiiiiiiiiiiiiiiiiiiiiiiiiiiiiiiiiiiiiiiiiiiiiiiiiiiiiiiiiiiiiiiiiiiiiiiiiiiiiiiiiiiiiiiiiiiiiiiiiiiiiiiiiiiiiiiiiiiiiiiiiiiiiiiiiiiiiiiiiiiiiiiiiiiiiiiiiiiiiiiiiiiiiiiiiiiiiiiiiiiiiiiiiiiiiiiiiiiiiiiiiiiiiiiiiiiiiiiiiiiiiiiiiiiiiiiiiiiiiiiiiiiiiiiiiiiiiiiiiiiiiiiiiiiiiiiiiiiiiiiiiiiiiiiiiiiiiiiiiiiiiiiiiiiiiiiiiiiiiiiiiiiiiiiiiiiiiiiiiiiiiiiiiiiiiiiiiiiiiiiiiiiiiiiiiiiiiiiiiiiiiiiiiiiiiiiiiiiiiiiiiiiiiiiiiiiiiiiiiiiiiiiiiiiiiiiiiiiiiiiiiiiiiiiiiiiiiiiiiiiiiiiiiiiiiiiiiiiiiiiiiiiiiiiiiiiiiiiiiiiiiiiiiiiiiiiiiiiiiiiiiiiiiiiiiiiiiiiiiiiiiiiiiiiiiiiiiiiiiiiiiiiiiiiiiiii iiiiiiiiiiiiiiiiiiiiiiiiiiiiiiiiiiiiiiiiiiiiiiiiiiiiiiiiiiiiiiiiiiiiiiiiiiiiiiiiiiiiiiiiiiiiiiiiiiiiiiiiiiiiiiiiiiiiiiiiiiiiiiiiiiiiiiiiiiiiiiiiiiiiiiiiiiiiiiiiiiiiiiiiiiiiiiiiiiiiiiiiiiiiiiiiiiiiiiiiiiiiiiiiiiiiiiiiiiiiiiiiiiiiiiiiiiiiiiiiiiiiiiiiiiiiiiiiiiiiiiiiiiiiiiiiiiiiiiiiiiiiiiiiiiiiiiiiiiiiiiiiiiiiiiiiiiiiiiiiiiiiiiiiiiiiiiiiiiiiiiiiiiiiiiiiiiiiiiiiiiiiiiiiiiiiiiiiiiiiiiiiiiiiiiiiiiiiiiiiiiiiiiiiiiiiiiiiiiiiiiiiiiiiiiiiiiiiiiiiiiiiiiiiiiiiiiiiiiiiiiiiiiiiiiiiiiiiiiiiiiiiiiiiiiiiiiiiiiiiiiiiiiiiiiiiiiiiiiiiiiiiiiiiiiiiiiiiiiiiiiiiiiiiiiiiiiiiiiiiiiiiiiiiiiiiiiiiiiiiiiiiiiiiiiiiiiiiiiiiiiiiiiiiiiiiiiiiiiiiiiiiiiiiiiiiiiiiiiiiiiiiiiiiiiiiiiiiiiiiiiiiiiiiiiiiiiiiiiiiiiiiiiiiiiiiiiiiiiiiiiiiiiiiiiiiiiiiiiiiiiiiiiiiiiiiiiiiiiiiiiiiiiiiiiiiiiiiiiiiiiiiiiiiiiiiiiiiiiiiiiiiiiiiiiiiiiiiiiiiiiiiiiiiiiiiiiiiiiiiiiiiiiiiiiiiiiiiiiiiiiiiiiiiiiiiiiiiiiiiiiiiiiiiiiiiiiiiiiiiiiiiiiiiiiiiiiiiiiiiiiiiiiiiiiiiiiiiiiiiiiiiiiiiiiiiiiiiiiiiiiiiiiiiiiiiiiiiiiiiiiiiiiiiiiiiiiiiiiiiiiiiiiiiiiiiiiiiiiiiiiiiiiiiiiiiiiiiiiiiiiiiiiiiiiiiiiiiiiiiiiiiiiiiiiiiiiiiiiiiiiiiiiiiiiiiiiiiiiiiiiiiiiiiiiiiiiiiiiiiiiiiiiiiiiiiiiiiiiiiiiiiiiiiiiiiiiiiiiiiiiiiiiiiiiiiiiiiiiiiiiiiiiiiiiiiiiiiiiiiiiiiiiiiiiiiiiiiiiiiiiiiiiiiiiiiiiiiiiiiiiiiiiiiiiiiiiiiiiiiiiiiiiiiiiiiiiiiiiiiiiiiiiiiiiiiiiiiiiiiiiiiiiiiiiiiiiiiiiiiiiiiiiiiiiiiiiiiiiiiiiiiiiiiiiiiiiiiiiiiiiiiiiiiiiiiiiiiiiiiiiiiiiiiiiiiiiiiiiiiiiiiiiiiiiiiiiiiiiiiiiiiiiiiiiiiiiiiiiiiiiiiiiiiiiiiiiiiiiiiiiiiiiiiiiiiiiiiiiiiiiiiiiiiiiiiiiiiiiiiiiiiiiiiiiiiiiiiiiiiiiiiiiiiiiiiiiiiiiiiiiiiiiiiiiiiiiiiiiiiiiiiiiiiiiiiiiiiiiiiiiiiiiiiiiiiiiiiiiiiiiiiiiiiiiiiiiiiiiiiiiiiiiiiiiiiiiiiiiiiiiiiiiiiiiiiiiiiiiiiiiiiiiiiiiiiiiiiiiiiiiiiiiiiiiiiiiiiiiiiiiiiiiiiiiiiiiiiiiiiiiiiiiiiiiiiiiiiiiiiiiiiiiiiiiiiiiiiiiiiiiiiiiiiiiiiiiiiiiiiiiiiiiiiiiiiiiiiiiiiiiiiiiiiiiiiiiiiiiiiiiiiiiiiiiiiiiiiiiiiiiiiiiiiiiiiiiiiiiiiiiiiiiiiiiiiiiiiiiiiiiiiiiiiiiiiiiiiiiiiiiiiiiiiiiiiiiiiiiiiiiiiiiiiiiiiiiiiiiiiiiiiiiiiiiiiiiiiiiiiiiiiiiiiii iiiiiiiiiiiiiiiiiiiiiiiiiiiiiiiiiiiiiiiiiiiiiiiiiiiiiiiiiiiiiiiiiiiiiiiiiiiiiiiiiiiiiiiiiiiiiiiiiiiiiiiiiiiiiiiiiiiiiiiiiiiiiiiiiiiiiiiiiiiiiiiiiiiiiiiiiiiiiiiiiiiiiiiiiiiiiiiiiiiiiiiiiiiiiiiiiiiiiiiiiiiiiiiiiiiiiiiiiiiiiiiiiiiiiiiiiiiiiiiiiiiiiiiiiiiiiiiiiiiiiiiiiiiiiiiiiiiiiiiiiiiiiiiiiiiiiiiiiiiiiiiiiiiiiiiiiiiiiiiiiiiiiiiiiiiiiiiiiiiiiiiiiiiiiiiiiiiiiiiiiiiiiiiiiiiiiiiiiiiiiiiiiiiiiiiiiiiiiiiiiiiiiiiiiiiiiiiiiiiiiiiiiiiiiiiiiiiiiiiiiiiiiiiiiiiiiiiiiiiiiiiiiiiiiiiiiiiiiiiiiiiiiiiiiiiiiiiiiiiiiiiiiiiiiiiiiiiiiiiiiiiiiiiiiiiiiiiiiiiiiiiiiiiiiiiiiiiiiiiiiiiiiiiiiiiiiiiiiiiiiiiiiiiiiiiiiiiiiiiiiiiiiiiiiiiiiiiiiiiiiiiiiiiiiiiiiiiiiiiiiiiiiiiiiiiiiiiiiiiiiiiiiiiiiiiiiiiiiiiiiiiiiiiiiiiiiiiiiiiiiiiiiiiiiiiiiiiiiiiiiiiiiiiiiiiiiiiiiiiiiiiiiiiiiiiiiiiiiiiiiiiiiiiiiiiiiiiiiiiiiiiiiiiiiiiiiiiiiiiiiiiiiiiiiiiiiiiiiiiiiiiiiiiiiiiiiiiiiiiiiiiiiiiiiiiiiiiiiiiiiiiiiiiiiiiiiiiiiiiiiiiiiiiiiiiiiiiiiiiiiiiiiiiiiiiiiiiiiiiiiiiiiiiiiiiiiiiiiiiiiiiiiiiiiiiiiiiiiiiiiiiiiiiiiiiiiiiiiiiiiiiiiiiiiiiiiiiiiiiiiiiiiiiiiiiiiiiiiiiiiiiiiiiiiiiiiiiiiiiiiiiiiiiiiiiiiiiiiiiiiiiiiiiiiiiiiiiiiiiiiiiiiiiiiiiiiiiiiiiiiiiiiiiiiiiiiiiiiiiiiiiiiiiiiiiiiiiiiiiiiiiiiiiiiiiiiiiiiiiiiiiiiiiiiiiiiiiiiiiiiiiiiiiiiiiiiiiiiiiiiiiiiiiiiiiiiiiiiiiiiiiiiiiiiiiiiiiiiiiiiiiiiiiiiiiiiiiiiiiiiiiiiiiiiiiiiiiiiiiiiiiiiiiiiiiiiiiiiiiiiiiiiiiiiiiiiiiiiiiiiiiiiiiiiiiiiiiiiiiiiiiiiiiiiiiiiiiiiiiiiiiiiiiiiiiiiiiiiiiiiiiiiiiiiiiiiiiiiiiiiiiiiiiiiiiiiiiiiiiiiiiiiiiiiiiiiiiiiiiiiiiiiiiiiiiiiiiiiiiiiiiiiiiiiiiiiiiiiiiiiiiiiiiiiiiiiiiiiiiiiiiiiiiiiiiiiiiiiiiiiiiiiiiiiiiiiiiiiiiiiiiiiiiiiiiiiiiiiiiiiiiiiiiiiiiiiiiiiiiiiiiiiiiiiiiiiiiiiiiiiiiiiiiiiiiiiiiiiiiiiiiiiiiiiiiiiiiiiiiiiiiiiiiiiiiiiiiiiiiiiiiiiiiiiiiiiiiiiiiiiiiiiiiiiiiiiiiiiiiiiiiiiiiiiiiiiiiiiiiiiiiiiiiiiiiiiiiiiiiiiiiiiiiiiiiiiiiiiiiiiiiiiiiiiiiiiiiiiiiiiiiiiiiiiiiiiiiiiiiiiiiiiiiiiiiiiiiiiiiiiiiiiiiiiiiiiiiiiiiiiiiiiiiiiiiiiiiiiiiiiiiiiiiiiiiiiiiiiiiiiiiiiiiiiiiiiiiiiiiiiiiiiiiiiiiiiiiiiiiiiiiiiiiiiiiiiiiiiiiiiiiiiiiiii iiiiiiiiiiiiiiiiiiiiiiiiiiiiiiiiiiiiiiiiiiiiiiiiiiiiiiiiiiiiiiiiiiiiiiiiiiiiiiiiiiiiiiiiiiiiiiiiiiiiiiiiiiiiiiiiiiiiiiiiiiiiiiiiiiiiiiiiiiiiiiiiiiiiiiiiiiiiiiiiiiiiiiiiiiiiiiiiiiiiiiiiiiiiiiiiiiiiiiiiiiiiiiiiiiiiiiiiiiiiiiiiiiiiiiiiiiiiiiiiiiiiiiiiiiiiiiiiiiiiiiiiiiiiiiiiiiiiiiiiiiiiiiiiiiiiiiiiiiiiiiiiiiiiiiiiiiiiiiiiiiiiiiiiiiiiiiiiiiiiiiiiiiiiiiiiiiiiiiiiiiiiiiiiiiiiiiiiiiiiiiiiiiiiiiiiiiiiiiiiiiiiiiiiiiiiiiiiiiiiiiiiiiiiiiiiiiiiiiiiiiiiiiiiiiiiiiiiiiiiiiiiiiiiiiiiiiiiiiiiiiiiiiiiiiiiiiiiiiiiiiiiiiiiiiiiiiiiiiiiiiiiiiiiiiiiiiiiiiiiiiiiiiiiiiiiiiiiiiiiiiiiiiiiiiiiiiiiiiiiiiiiiiiiiiiiiiiiiiiiiiiiiiiiiiiiiiiiiiiiiiiiiiiiiiiiiiiiiiiiiiiiiiiiiiiiiiiiiiiiiiiiiiiiiiiiiiiiiiiiiiiiiiiiiiiiiiiiiiiiiiiiiiiiiiiiiiiiiiiiiiiiiiiiiiiiiiiiiiiiiiiiiiiiiiiiiiiiiiiiiiiiiiiiiiiiiiiiiiiiiiiiiiiiiiiiiiiiiiiiiiiiiiiiiiiiiiiiiiiiiiiiiiiiiiiiiiiiiiiiiiiiiiiiiiiiiiiiiiiiiiiiiiiiiiiiiiiiiiiiiiiiiiiiiiiiiiiiiiiiiiiiiiiiiiiiiiiiiiiiiiiiiiiiiiiiiiiiiiiiiiiiiiiiiiiiiiiiiiiiiiiiiiiiiiiiiiiiiiiiiiiiiiiiiiiiiiiiiiiiiiiiiiiiiiiiiiiiiiiiiiiiiiiiiiiiiiiiiiiiiiiiiiiiiiiiiiiiiiiiiiiiiiiiiiiiiiiiiiiiiiiiiiiiiiiiiiiiiiiiiiiiiiiiiiiiiiiiiiiiiiiiiiiiiiiiiiiiiiiiiiiiiiiiiiiiiiiiiiiiiiiiiiiiiiiiiiiiiiiiiiiiiiiiiiiiiiiiiiiiiiiiiiiiiiiiiiiiiiiiiiiiiiiiiiiiiiiiiiiiiiiiiiiiiiiiiiiiiiiiiiiiiiiiiiiiiiiiiiiiiiiiiiiiiiiiiiiiiiiiiiiiiiiiiiiiiiiiiiiiiiiiiiiiiiiiiiiiiiiiiiiiiiiiiiiiiiiiiiiiiiiiiiiiiiiiiiiiiiiiiiiiiiiiiiiiiiiiiiiiiiiiiiiiiiiiiiiiiiiiiiiiiiiiiiiiiiiiiiiiiiiiiiiiiiiiiiiiiiiiiiiiiiiiiiiiiiiiiiiiiiiiiiiiiiiiiiiiiiiiiiiiiiiiiiiiiiiiiiiiiiiiiiiiiiiiiiiiiiiiiiiiiiiiiiiiiiiiiiiiiiiiiiiiiiiiiiiiiiiiiiiiiiiiiiiiiiiiiiiiiiiiiiiiiiiiiiiiiiiiiiiiiiiiiiiiiiiiiiiiiiiiiiiiiiiiiiiiiiiiiiiiiiiiiiiiiiiiiiiiiiiiiiiiiiiiiiiiiiiiiiiiiiiiiiiiiiiiiiiiiiiiiiiiiiiiiiiiiiiiiiiiiiiiiiiiiiiiiiiiiiiiiiiiiiiiiiiiiiiiiiiiiiiiiiiiiiiiiiiiiiiiiiiiiiiiiiiiiiiiiiiiiiiiiiiiiiiiiiiiiiiiiiiiiiiiiiiiiiiiiiiiiiiiiiiiiiiiiiiiiiiiiiiiiiiiiiiiiiiiiiiiiiiiiiiiiiiiiiiiiiiiiiiiiiiiiiiiiiiiiiiiiiiiii iiiiiiiiiiiiiiiiiiiiiiiiiiiiiiiiiiiiiiiiiiiiiiiiiiiiiiiiiiiiiiiiiiiiiiiiiiiiiiiiiiiiiiiiiiiiiiiiiiiiiiiiiiiiiiiiiiiiiiiiiiiiiiiiiiiiiiiiiiiiiiiiiiiiiiiiiiiiiiiiiiiiiiiiiiiiiiiiiiiiiiiiiiiiiiiiiiiiiiiiiiiiiiiiiiiiiiiiiiiiiiiiiiiiiiiiiiiiiiiiiiiiiiiiiiiiiiiiiiiiiiiiiiiiiiiiiiiiiiiiiiiiiiiiiiiiiiiiiiiiiiiiiiiiiiiiiiiiiiiiiiiiiiiiiiiiiiiiiiiiiiiiiiiiiiiiiiiiiiiiiiiiiiiiiiiiiiiiiiiiiiiiiiiiiiiiiiiiiiiiiiiiiiiiiiiiiiiiiiiiiiiiiiiiiiiiiiiiiiiiiiiiiiiiiiiiiiiiiiiiiiiiiiiiiiiiiiiiiiiiiiiiiiiiiiiiiiiiiiiiiiiiiiiiiiiiiiiiiiiiiiiiiiiiiiiiiiiiiiiiiiiiiiiiiiiiiiiiiiiiiiiiiiiiiiiiiiiiiiiiiiiiiiiiiiiiiiiiiiiiiiiiiiiiiiiiiiiiiiiiiiiiiiiiiiiiiiiiiiiiiiiiiiiiiiiiiiiiiiiiiiiiiiiiiiiiiiiiiiiiiiiiiiiiiiiiiiiiiiiiiiiiiiiiiiiiiiiiiiiiiiiiiiiiiiiiiiiiiiiiiiiiiiiiiiiiiiiiiiiiiiiiiiiiiiiiiiiiiiiiiiiiiiiiiiiiiiiiiiiiiiiiiiiiiiiiiiiiiiiiiiiiiiiiiiiiiiiiiiiiiiiiiiiiiiiiiiiiiiiiiiiiiiiiiiiiiiiiiiiiiiiiiiiiiiiiiiiiiiiiiiiiiiiiiiiiiiiiiiiiiiiiiiiiiiiiiiiiiiiiiiiiiiiiiiiiiiiiiiiiiiiiiiiiiiiiiiiiiiiiiiiiiiiiiiiiiiiiiiiiiiiiiiiiiiiiiiiiiiiiiiiiiiiiiiiiiiiiiiiiiiiiiiiiiiiiiiiiiiiiiiiiiiiiiiiiiiiiiiiiiiiiiiiiiiiiiiiiiiiiiiiiiiiiiiiiiiiiiiiiiiiiiiiiiiiiiiiiiiiiiiiiiiiiiiiiiiiiiiiiiiiiiiiiiiiiiiiiiiiiiiiiiiiiiiiiiiiiiiiiiiiiiiiiiiiiiiiiiiiiiiiiiiiiiiiiiiiiiiiiiiiiiiiiiiiiiiiiiiiiiiiiiiiiiiiiiiiiiiiiiiiiiiiiiiiiiiiiiiiiiiiiiiiiiiiiiiiiiiiiiiiiiiiiiiiiiiiiiiiiiiiiiiiiiiiiiiiiiiiiiiiiiiiiiiiiiiiiiiiiiiiiiiiiiiiiiiiiiiiiiiiiiiiiiiiiiiiiiiiiiiiiiiiiiiiiiiiiiiiiiiiiiiiiiiiiiiiiiiiiiiiiiiiiiiiiiiiiiiiiiiiiiiiiiiiiiiiiiiiiiiiiiiiiiiiiiiiiiiiiiiiiiiiiiiiiiiiiiiiiiiiiiiiiiiiiiiiiiiiiiiiiiiiiiiiiiiiiiiiiiiiiiiiiiiiiiiiiiiiiiiiiiiiiiiiiiiiiiiiiiiiiiiiiiiiiiiiiiiiiiiiiiiiiiiiiiiiiiiiiiiiiiiiiiiiiiiiiiiiiiiiiiiiiiiiiiiiiiiiiiiiiiiiiiiiiiiiiiiiiiiiiiiiiiiiiiiiiiiiiiiiiiiiiiiiiiiiiiiiiiiiiiiiiiiiiiiiiiiiiiiiiiiiiiiiiiiiiiiiiiiiiiiiiiiiiiiiiiiiiiiiiiiiiiiiiiiiiiiiiiiiiiiiiiiiiiiiiiiiiiiiiiiiiiiiiiiiiiiiiiiiiiiiiiiiiiiiiiiiiiiiiiiiiiiiiiiiiiiiiiiiiiiiiiiiiiiiiiiiiiiiiiiiiii iiiiiiiiiiiiiiiiiiiiiiiiiiiiiiiiiiiiiiiiiiiiiiiiiiiiiiiiiiiiiiiiiiiiiiiiiiiiiiiiiiiiiiiiiiiiiiiiiiiiiiiiiiiiiiiiiiiiiiiiiiiiiiiiiiiiiiiiiiiiiiiiiiiiiiiiiiiiiiiiiiiiiiiiiiiiiiiiiiiiiiiiiiiiiiiiiiiiiiiiiiiiiiiiiiiiiiiiiiiiiiiiiiiiiiiiiiiiiiiiiiiiiiiiiiiiiiiiiiiiiiiiiiiiiiiiiiiiiiiiiiiiiiiiiiiiiiiiiiiiiiiiiiiiiiiiiiiiiiiiiiiiiiiiiiiiiiiiiiiiiiiiiiiiiiiiiiiiiiiiiiiiiiiiiiiiiiiiiiiiiiiiiiiiiiiiiiiiiiiiiiiiiiiiiiiiiiiiiiiiiiiiiiiiiiiiiiiiiiiiiiiiiiiiiiiiiiiiiiiiiiiiiiiiiiiiiiiiiiiiiiiiiiiiiiiiiiiiiiiiiiiiiiiiiiiiiiiiiiiiiiiiiiiiiiiiiiiiiiiiiiiiiiiiiiiiiiiiiiiiiiiiiiiiiiiiiiiiiiiiiiiiiiiiiiiiiiiiiiiiiiiiiiiiiiiiiiiiiiiiiiiiiiiiiiiiiiiiiiiiiiiiiiiiiiiiiiiiiiiiiiiiiiiiiiiiiiiiiiiiiiiiiiiiiiiiiiiiiiiiiiiiiiiiiiiiiiiiiiiiiiiiiiiiiiiiiiiiiiiiiiiiiiiiiiiiiiiiiiiiiiiiiiiiiiiiiiiiiiiiiiiiiiiiiiiiiiiiiiiiiiiiiiiiiiiiiiiiiiiiiiiiiiiiiiiiiiiiiiiiiiiiiiiiiiiiiiiiiiiiiiiiiiiiiiiiiiiiiiiiiiiiiiiiiiiiiiiiiiiiiiiiiiiiiiiiiiiiiiiiiiiiiiiiiiiiiiiiiiiiiiiiiiiiiiiiiiiiiiiiiiiiiiiiiiiiiiiiiiiiiiiiiiiiiiiiiiiiiiiiiiiiiiiiiiiiiiiiiiiiiiiiiiiiiiiiiiiiiiiiiiiiiiiiiiiiiiiiiiiiiiiiiiiiiiiiiiiiiiiiiiiiiiiiiiiiiiiiiiiiiiiiiiiiiiiiiiiiiiiiiiiiiiiiiiiiiiiiiiiiiiiiiiiiiiiiiiiiiiiiiiiiiiiiiiiiiiiiiiiiiiiiiiiiiiiiiiiiiiiiiiiiiiiiiiiiiiiiiiiiiiiiiiiiiiiiiiiiiiiiiiiiiiiiiiiiiiiiiiiiiiiiiiiiiiiiiiiiiiiiiiiiiiiiiiiiiiiiiiiiiiiiiiiiiiiiiiiiiiiiiiiiiiiiiiiiiiiiiiiiiiiiiiiiiiiiiiiiiiiiiiiiiiiiiiiiiiiiiiiiiiiiiiiiiiiiiiiiiiiiiiiiiiiiiiiiiiiiiiiiiiiiiiiiiiiiiiiiiiiiiiiiiiiiiiiiiiiiiiiiiiiiiiiiiiiiiiiiiiiiiiiiiiiiiiiiiiiiiiiiiiiiiiiiiiiiiiiiiiiiiiiiiiiiiiiiiiiiiiiiiiiiiiiiiiiiiiiiiiiiiiiiiiiiiiiiiiiiiiiiiiiiiiiiiiiiiiiiiiiiiiiiiiiiiiiiiiiiiiiiiiiiiiiiiiiiiiiiiiiiiiiiiiiiiiiiiiiiiiiiiiiiiiiiiiiiiiiiiiiiiiiiiiiiiiiiiiiiiiiiiiiiiiiiiiiiiiiiiiiiiiiiiiiiiiiiiiiiiiiiiiiiiiiiiiiiiiiiiiiiiiiiiiiiiiiiiiiiiiiiiiiiiiiiiiiiiiiiiiiiiiiiiiiiiiiiiiiiiiiiiiiiiiiiiiiiiiiiiiiiiiiiiiiiiiiiiiiiiiiiiiiiiiiiiiiiiiiiiiiiiiiiiiiiiiiiiiiiiiiiiiiiiiiiiiiiiiiiiiiiiiiiiiiiiiiiiiiiiiiiiiiiiiiiii iiiiiiiiiiiiiiiiiiiiiiiiiiiiiiiiiiiiiiiiiiiiiiiiiiiiiiiiiiiiiiiiiiiiiiiiiiiiiiiiiiiiiiiiiiiiiiiiiiiiiiiiiiiiiiiiiiiiiiiiiiiiiiiiiiiiiiiiiiiiiiiiiiiiiiiiiiiiiiiiiiiiiiiiiiiiiiiiiiiiiiiiiiiiiiiiiiiiiiiiiiiiiiiiiiiiiiiiiiiiiiiiiiiiiiiiiiiiiiiiiiiiiiiiiiiiiiiiiiiiiiiiiiiiiiiiiiiiiiiiiiiiiiiiiiiiiiiiiiiiiiiiiiiiiiiiiiiiiiiiiiiiiiiiiiiiiiiiiiiiiiiiiiiiiiiiiiiiiiiiiiiiiiiiiiiiiiiiiiiiiiiiiiiiiiiiiiiiiiiiiiiiiiiiiiiiiiiiiiiiiiiiiiiiiiiiiiiiiiiiiiiiiiiiiiiiiiiiiiiiiiiiiiiiiiiiiiiiiiiiiiiiiiiiiiiiiiiiiiiiiiiiiiiiiiiiiiiiiiiiiiiiiiiiiiiiiiiiiiiiiiiiiiiiiiiiiiiiiiiiiiiiiiiiiiiiiiiiiiiiiiiiiiiiiiiiiiiiiiiiiiiiiiiiiiiiiiiiiiiiiiiiiiiiiiiiiiiiiiiiiiiiiiiiiiiiiiiiiiiiiiiiiiiiiiiiiiiiiiiiiiiiiiiiiiiiiiiiiiiiiiiiiiiiiiiiiiiiiiiiiiiiiiiiiiiiiiiiiiiiiiiiiiiiiiiiiiiiiiiiiiiiiiiiiiiiiiiiiiiiiiiiiiiiiiiiiiiiiiiiiiiiiiiiiiiiiiiiiiiiiiiiiiiiiiiiiiiiiiiiiiiiiiiiiiiiiiiiiiiiiiiiiiiiiiiiiiiiiiiiiiiiiiiiiiiiiiiiiiiiiiiiiiiiiiiiiiiiiiiiiiiiiiiiiiiiiiiiiiiiiiiiiiiiiiiiiiiiiiiiiiiiiiiiiiiiiiiiiiiiiiiiiiiiiiiiiiiiiiiiiiiiiiiiiiiiiiiiiiiiiiiiiiiiiiiiiiiiiiiiiiiiiiiiiiiiiiiiiiiiiiiiiiiiiiiiiiiiiiiiiiiiiiiiiiiiiiiiiiiiiiiiiiiiiiiiiiiiiiiiiiiiiiiiiiiiiiiiiiiiiiiiiiiiiiiiiiiiiiiiiiiiiiiiiiiiiiiiiiiiiiiiiiiiiiiiiiiiiiiiiiiiiiiiiiiiiiiiiiiiiiiiiiiiiiiiiiiiiiiiiiiiiiiiiiiiiiiiiiiiiiiiiiiiiiiiiiiiiiiiiiiiiiiiiiiiiiiiiiiiiiiiiiiiiiiiiiiiiiiiiiiiiiiiiiiiiiiiiiiiiiiiiiiiiiiiiiiiiiiiiiiiiiiiiiiiiiiiiiiiiiiiiiiiiiiiiiiiiiiiiiiiiiiiiiiiiiiiiiiiiiiiiiiiiiiiiiiiiiiiiiiiiiiiiiiiiiiiiiiiiiiiiiiiiiiiiiiiiiiiiiiiiiiiiiiiiiiiiiiiiiiiiiiiiiiiiiiiiiiiiiiiiiiiiiiiiiiiiiiiiiiiiiiiiiiiiiiiiiiiiiiiiiiiiiiiiiiiiiiiiiiiiiiiiiiiiiiiiiiiiiiiiiiiiiiiiiiiiiiiiiiiiiiiiiiiiiiiiiiiiiiiiiiiiiiiiiiiiiiiiiiiiiiiiiiiiiiiiiiiiiiiiiiiiiiiiiiiiiiiiiiiiiiiiiiiiiiiiiiiiiiiiiiiiiiiiiiiiiiiiiiiiiiiiiiiiiiiiiiiiiiiiiiiiiiiiiiiiiiiiiiiiiiiiiiiiiiiiiiiiiiiiiiiiiiiiiiiiiiiiiiiiiiiiiiiiiiiiiiiiiiiiiiiiiiiiiiiiiiiiiiiiiiiiiiiiiiiiiiiiiiiiiiiiiiiiiiiiiiiiiiiiiiiiiiiiiiiiiiiiiiiiiiiiiiiiiiiiiiiiiiiiiiiii iiiiiiiiiiiiiiiiiiiiiiiiiiiiiiiiiiiiiiiiiiiiiiiiiiiiiiiiiiiiiiiiiiiiiiiiiiiiiiiiiiiiiiiiiiiiiiiiiiiiiiiiiiiiiiiiiiiiiiiiiiiiiiiiiiiiiiiiiiiiiiiiiiiiiiiiiiiiiiiiiiiiiiiiiiiiiiiiiiiiiiiiiiiiiiiiiiiiiiiiiiiiiiiiiiiiiiiiiiiiiiiiiiiiiiiiiiiiiiiiiiiiiiiiiiiiiiiiiiiiiiiiiiiiiiiiiiiiiiiiiiiiiiiiiiiiiiiiiiiiiiiiiiiiiiiiiiiiiiiiiiiiiiiiiiiiiiiiiiiiiiiiiiiiiiiiiiiiiiiiiiiiiiiiiiiiiiiiiiiiiiiiiiiiiiiiiiiiiiiiiiiiiiiiiiiiiiiiiiiiiiiiiiiiiiiiiiiiiiiiiiiiiiiiiiiiiiiiiiiiiiiiiiiiiiiiiiiiiiiiiiiiiiiiiiiiiiiiiiiiiiiiiiiiiiiiiiiiiiiiiiiiiiiiiiiiiiiiiiiiiiiiiiiiiiiiiiiiiiiiiiiiiiiiiiiiiiiiiiiiiiiiiiiiiiiiiiiiiiiiiiiiiiiiiiiiiiiiiiiiiiiiiiiiiiiiiiiiiiiiiiiiiiiiiiiiiiiiiiiiiiiiiiiiiiiiiiiiiiiiiiiiiiiiiiiiiiiiiiiiiiiiiiiiiiiiiiiiiiiiiiiiiiiiiiiiiiiiiiiiiiiiiiiiiiiiiiiiiiiiiiiiiiiiiiiiiiiiiiiiiiiiiiiiiiiiiiiiiiiiiiiiiiiiiiiiiiiiiiiiiiiiiiiiiiiiiiiiiiiiiiiiiiiiiiiiiiiiiiiiiiiiiiiiiiiiiiiiiiiiiiiiiiiiiiiiiiiiiiiiiiiiiiiiiiiiiiiiiiiiiiiiiiiiiiiiiiiiiiiiiiiiiiiiiiiiiiiiiiiiiiiiiiiiiiiiiiiiiiiiiiiiiiiiiiiiiiiiiiiiiiiiiiiiiiiiiiiiiiiiiiiiiiiiiiiiiiiiiiiiiiiiiiiiiiiiiiiiiiiiiiiiiiiiiiiiiiiiiiiiiiiiiiiiiiiiiiiiiiiiiiiiiiiiiiiiiiiiiiiiiiiiiiiiiiiiiiiiiiiiiiiiiiiiiiiiiiiiiiiiiiiiiiiiiiiiiiiiiiiiiiiiiiiiiiiiiiiiiiiiiiiiiiiiiiiiiiiiiiiiiiiiiiiiiiiiiiiiiiiiiiiiiiiiiiiiiiiiiiiiiiiiiiiiiiiiiiiiiiiiiiiiiiiiiiiiiiiiiiiiiiiiiiiiiiiiiiiiiiiiiiiiiiiiiiiiiiiiiiiiiiiiiiiiiiiiiiiiiiiiiiiiiiiiiiiiiiiiiiiiiiiiiiiiiiiiiiiiiiiiiiiiiiiiiiiiiiiiiiiiiiiiiiiiiiiiiiiiiiiiiiiiiiiiiiiiiiiiiiiiiiiiiiiiiiiiiiiiiiiiiiiiiiiiiiiiiiiiiiiiiiiiiiiiiiiiiiiiiiiiiiiiiiiiiiiiiiiiiiiiiiiiiiiiiiiiiiiiiiiiiiiiiiiiiiiiiiiiiiiiiiiiiiiiiiiiiiiiiiiiiiiiiiiiiiiiiiiiiiiiiiiiiiiiiiiiiiiiiiiiiiiiiiiiiiiiiiiiiiiiiiiiiiiiiiiiiiiiiiiiiiiiiiiiiiiiiiiiiiiiiiiiiiiiiiiiiiiiiiiiiiiiiiiiiiiiiiiiiiiiiiiiiiiiiiiiiiiiiiiiiiiiiiiiiiiiiiiiiiiiiiiiiiiiiiiiiiiiiiiiiiiiiiiiiiiiiiiiiiiiiiiiiiiiiiiiiiiiiiiiiiiiiiiiiiiiiiiiiiiiiiiiiiiiiiiiiiiiiiiiiiiiiiiiiiiiiiiiiiiiiiiiiiiiiiiiiiiiiiiiiiiiiiiiiiiiiiiiiiiiiiiiiiiiii iiiiiiiiiiiiiiiiiiiiiiiiiiiiiiiiiiiiiiiiiiiiiiiiiiiiiiiiiiiiiiiiiiiiiiiiiiiiiiiiiiiiiiiiiiiiiiiiiiiiiiiiiiiiiiiiiiiiiiiiiiiiiiiiiiiiiiiiiiiiiiiiiiiiiiiiiiiiiiiiiiiiiiiiiiiiiiiiiiiiiiiiiiiiiiiiiiiiiiiiiiiiiiiiiiiiiiiiiiiiiiiiiiiiiiiiiiiiiiiiiiiiiiiiiiiiiiiiiiiiiiiiiiiiiiiiiiiiiiiiiiiiiiiiiiiiiiiiiiiiiiiiiiiiiiiiiiiiiiiiiiiiiiiiiiiiiiiiiiiiiiiiiiiiiiiiiiiiiiiiiiiiiiiiiiiiiiiiiiiiiiiiiiiiiiiiiiiiiiiiiiiiiiiiiiiiiiiiiiiiiiiiiiiiiiiiiiiiiiiiiiiiiiiiiiiiiiiiiiiiiiiiiiiiiiiiiiiiiiiiiiiiiiiiiiiiiiiiiiiiiiiiiiiiiiiiiiiiiiiiiiiiiiiiiiiiiiiiiiiiiiiiiiiiiiiiiiiiiiiiiiiiiiiiiiiiiiiiiiiiiiiiiiiiiiiiiiiiiiiiiiiiiiiiiiiiiiiiiiiiiiiiiiiiiiiiiiiiiiiiiiiiiiiiiiiiiiiiiiiiiiiiiiiiiiiiiiiiiiiiiiiiiiiiiiiiiiiiiiiiiiiiiiiiiiiiiiiiiiiiiiiiiiiiiiiiiiiiiiiiiiiiiiiiiiiiiiiiiiiiiiiiiiiiiiiiiiiiiiiiiiiiiiiiiiiiiiiiiiiiiiiiiiiiiiiiiiiiiiiiiiiiiiiiiiiiiiiiiiiiiiiiiiiiiiiiiiiiiiiiiiiiiiiiiiiiiiiiiiiiiiiiiiiiiiiiiiiiiiiiiiiiiiiiiiiiiiiiiiiiiiiiiiiiiiiiiiiiiiiiiiiiiiiiiiiiiiiiiiiiiiiiiiiiiiiiiiiiiiiiiiiiiiiiiiiiiiiiiiiiiiiiiiiiiiiiiiiiiiiiiiiiiiiiiiiiiiiiiiiiiiiiiiiiiiiiiiiiiiiiiiiiiiiiiiiiiiiiiiiiiiiiiiiiiiiiiiiiiiiiiiiiiiiiiiiiiiiiiiiiiiiiiiiiiiiiiiiiiiiiiiiiiiiiiiiiiiiiiiiiiiiiiiiiiiiiiiiiiiiiiiiiiiiiiiiiiiiiiiiiiiiiiiiiiiiiiiiiiiiiiiiiiiiiiiiiiiiiiiiiiiiiiiiiiiiiiiiiiiiiiiiiiiiiiiiiiiiiiiiiiiiiiiiiiiiiiiiiiiiiiiiiiiiiiiiiiiiiiiiiiiiiiiiiiiiiiiiiiiiiiiiiiiiiiiiiiiiiiiiiiiiiiiiiiiiiiiiiiiiiiiiiiiiiiiiiiiiiiiiiiiiiiiiiiiiiiiiiiiiiiiiiiiiiiiiiiiiiiiiiiiiiiiiiiiiiiiiiiiiiiiiiiiiiiiiiiiiiiiiiiiiiiiiiiiiiiiiiiiiiiiiiiiiiiiiiiiiiiiiiiiiiiiiiiiiiiiiiiiiiiiiiiiiiiiiiiiiiiiiiiiiiiiiiiiiiiiiiiiiiiiiiiiiiiiiiiiiiiiiiiiiiiiiiiiiiiiiiiiiiiiiiiiiiiiiiiiiiiiiiiiiiiiiiiiiiiiiiiiiiiiiiiiiiiiiiiiiiiiiiiiiiiiiiiiiiiiiiiiiiiiiiiiiiiiiiiiiiiiiiiiiiiiiiiiiiiiiiiiiiiiiiiiiiiiiiiiiiiiiiiiiiiiiiiiiiiiiiiiiiiiiiiiiiiiiiiiiiiiiiiiiiiiiiiiiiiiiiiiiiiiiiiiiiiiiiiiiiiiiiiiiiiiiiiiiiiiiiiiiiiiiiiiiiiiiiiiiiiiiiiiiiiiiiiiiiiiiiiiiiiiiiiiiiiiiiiiiiiiiiiiiiiiiiiiiiiiiiiiiiiiiiiiiiii iiiiiiiiiiiiiiiiiiiiiiiiiiiiiiiiiiiiiiiiiiiiiiiiiiiiiiiiiiiiiiiiiiiiiiiiiiiiiiiiiiiiiiiiiiiiiiiiiiiiiiiiiiiiiiiiiiiiiiiiiiiiiiiiiiiiiiiiiiiiiiiiiiiiiiiiiiiiiiiiiiiiiiiiiiiiiiiiiiiiiiiiiiiiiiiiiiiiiiiiiiiiiiiiiiiiiiiiiiiiiiiiiiiiiiiiiiiiiiiiiiiiiiiiiiiiiiiiiiiiiiiiiiiiiiiiiiiiiiiiiiiiiiiiiiiiiiiiiiiiiiiiiiiiiiiiiiiiiiiiiiiiiiiiiiiiiiiiiiiiiiiiiiiiiiiiiiiiiiiiiiiiiiiiiiiiiiiiiiiiiiiiiiiiiiiiiiiiiiiiiiiiiiiiiiiiiiiiiiiiiiiiiiiiiiiiiiiiiiiiiiiiiiiiiiiiiiiiiiiiiiiiiiiiiiiiiiiiiiiiiiiiiiiiiiiiiiiiiiiiiiiiiiiiiiiiiiiiiiiiiiiiiiiiiiiiiiiiiiiiiiiiiiiiiiiiiiiiiiiiiiiiiiiiiiiiiiiiiiiiiiiiiiiiiiiiiiiiiiiiiiiiiiiiiiiiiiiiiiiiiiiiiiiiiiiiiiiiiiiiiiiiiiiiiiiiiiiiiiiiiiiiiiiiiiiiiiiiiiiiiiiiiiiiiiiiiiiiiiiiiiiiiiiiiiiiiiiiiiiiiiiiiiiiiiiiiiiiiiiiiiiiiiiiiiiiiiiiiiiiiiiiiiiiiiiiiiiiiiiiiiiiiiiiiiiiiiiiiiiiiiiiiiiiiiiiiiiiiiiiiiiiiiiiiiiiiiiiiiiiiiiiiiiiiiiiiiiiiiiiiiiiiiiiiiiiiiiiiiiiiiiiiiiiiiiiiiiiiiiiiiiiiiiiiiiiiiiiiiiiiiiiiiiiiiiiiiiiiiiiiiiiiiiiiiiiiiiiiiiiiiiiiiiiiiiiiiiiiiiiiiiiiiiiiiiiiiiiiiiiiiiiiiiiiiiiiiiiiiiiiiiiiiiiiiiiiiiiiiiiiiiiiiiiiiiiiiiiiiiiiiiiiiiiiiiiiiiiiiiiiiiiiiiiiiiiiiiiiiiiiiiiiiiiiiiiiiiiiiiiiiiiiiiiiiiiiiiiiiiiiiiiiiiiiiiiiiiiiiiiiiiiiiiiiiiiiiiiiiiiiiiiiiiiiiiiiiiiiiiiiiiiiiiiiiiiiiiiiiiiiiiiiiiiiiiiiiiiiiiiiiiiiiiiiiiiiiiiiiiiiiiiiiiiiiiiiiiiiiiiiiiiiiiiiiiiiiiiiiiiiiiiiiiiiiiiiiiiiiiiiiiiiiiiiiiiiiiiiiiiiiiiiiiiiiiiiiiiiiiiiiiiiiiiiiiiiiiiiiiiiiiiiiiiiiiiiiiiiiiiiiiiiiiiiiiiiiiiiiiiiiiiiiiiiiiiiiiiiiiiiiiiiiiiiiiiiiiiiiiiiiiiiiiiiiiiiiiiiiiiiiiiiiiiiiiiiiiiiiiiiiiiiiiiiiiiiiiiiiiiiiiiiiiiiiiiiiiiiiiiiiiiiiiiiiiiiiiiiiiiiiiiiiiiiiiiiiiiiiiiiiiiiiiiiiiiiiiiiiiiiiiiiiiiiiiiiiiiiiiiiiiiiiiiiiiiiiiiiiiiiiiiiiiiiiiiiiiiiiiiiiiiiiiiiiiiiiiiiiiiiiiiiiiiiiiiiiiiiiiiiiiiiiiiiiiiiiiiiiiiiiiiiiiiiiiiiiiiiiiiiiiiiiiiiiiiiiiiiiiiiiiiiiiiiiiiiiiiiiiiiiiiiiiiiiiiiiiiiiiiiiiiiiiiiiiiiiiiiiiiiiiiiiiiiiiiiiiiiiiiiiiiiiiiiiiiiiiiiiiiiiiiiiiiiiiiiiiiiiiiiiiiiiiiiiiiiiiiiiiiiiiiiiiiiiiiiiiiiiiiiiiiiiiiiiiiiiiiiiiiiiiiiiiiii iiiiiiiiiiiiiiiiiiiiiiiiiiiiiiiiiiiiiiiiiiiiiiiiiiiiiiiiiiiiiiiiiiiiiiiiiiiiiiiiiiiiiiiiiiiiiiiiiiiiiiiiiiiiiiiiiiiiiiiiiiiiiiiiiiiiiiiiiiiiiiiiiiiiiiiiiiiiiiiiiiiiiiiiiiiiiiiiiiiiiiiiiiiiiiiiiiiiiiiiiiiiiiiiiiiiiiiiiiiiiiiiiiiiiiiiiiiiiiiiiiiiiiiiiiiiiiiiiiiiiiiiiiiiiiiiiiiiiiiiiiiiiiiiiiiiiiiiiiiiiiiiiiiiiiiiiiiiiiiiiiiiiiiiiiiiiiiiiiiiiiiiiiiiiiiiiiiiiiiiiiiiiiiiiiiiiiiiiiiiiiiiiiiiiiiiiiiiiiiiiiiiiiiiiiiiiiiiiiiiiiiiiiiiiiiiiiiiiiiiiiiiiiiiiiiiiiiiiiiiiiiiiiiiiiiiiiiiiiiiiiiiiiiiiiiiiiiiiiiiiiiiiiiiiiiiiiiiiiiiiiiiiiiiiiiiiiiiiiiiiiiiiiiiiiiiiiiiiiiiiiiiiiiiiiiiiiiiiiiiiiiiiiiiiiiiiiiiiiiiiiiiiiiiiiiiiiiiiiiiiiiiiiiiiiiiiiiiiiiiiiiiiiiiiiiiiiiiiiiiiiiiiiiiiiiiiiiiiiiiiiiiiiiiiiiiiiiiiiiiiiiiiiiiiiiiiiiiiiiiiiiiiiiiiiiiiiiiiiiiiiiiiiiiiiiiiiiiiiiiiiiiiiiiiiiiiiiiiiiiiiiiiiiiiiiiiiiiiiiiiiiiiiiiiiiiiiiiiiiiiiiiiiiiiiiiiiiiiiiiiiiiiiiiiiiiiiiiiiiiiiiiiiiiiiiiiiiiiiiiiiiiiiiiiiiiiiiiiiiiiiiiiiiiiiiiiiiiiiiiiiiiiiiiiiiiiiiiiiiiiiiiiiiiiiiiiiiiiiiiiiiiiiiiiiiiiiiiiiiiiiiiiiiiiiiiiiiiiiiiiiiiiiiiiiiiiiiiiiiiiiiiiiiiiiiiiiiiiiiiiiiiiiiiiiiiiiiiiiiiiiiiiiiiiiiiiiiiiiiiiiiiiiiiiiiiiiiiiiiiiiiiiiiiiiiiiiiiiiiiiiiiiiiiiiiiiiiiiiiiiiiiiiiiiiiiiiiiiiiiiiiiiiiiiiiiiiiiiiiiiiiiiiiiiiiiiiiiiiiiiiiiiiiiiiiiiiiiiiiiiiiiiiiiiiiiiiiiiiiiiiiiiiiiiiiiiiiiiiiiiiiiiiiiiiiiiiiiiiiiiiiiiiiiiiiiiiiiiiiiiiiiiiiiiiiiiiiiiiiiiiiiiiiiiiiiiiiiiiiiiiiiiiiiiiiiiiiiiiiiiiiiiiiiiiiiiiiiiiiiiiiiiiiiiiiiiiiiiiiiiiiiiiiiiiiiiiiiiiiiiiiiiiiiiiiiiiiiiiiiiiiiiiiiiiiiiiiiiiiiiiiiiiiiiiiiiiiiiiiiiiiiiiiiiiiiiiiiiiiiiiiiiiiiiiiiiiiiiiiiiiiiiiiiiiiiiiiiiiiiiiiiiiiiiiiiiiiiiiiiiiiiiiiiiiiiiiiiiiiiiiiiiiiiiiiiiiiiiiiiiiiiiiiiiiiiiiiiiiiiiiiiiiiiiiiiiiiiiiiiiiiiiiiiiiiiiiiiiiiiiiiiiiiiiiiiiiiiiiiiiiiiiiiiiiiiiiiiiiiiiiiiiiiiiiiiiiiiiiiiiiiiiiiiiiiiiiiiiiiiiiiiiiiiiiiiiiiiiiiiiiiiiiiiiiiiiiiiiiiiiiiiiiiiiiiiiiiiiiiiiiiiiiiiiiiiiiiiiiiiiiiiiiiiiiiiiiiiiiiiiiiiiiiiiiiiiiiiiiiiiiiiiiiiiiiiiiiiiiiiiiiiiiiiiiiiiiiiiiiiiiiiiiiiiiiiiiiiiiiiiiiiiiiiiiiiiiiiiiiiiiiiiiiiii iiiiiiiiiiiiiiiiiiiiiiiiiiiiiiiiiiiiiiiiiiiiiiiiiiiiiiiiiiiiiiiiiiiiiiiiiiiiiiiiiiiiiiiiiiiiiiiiiiiiiiiiiiiiiiiiiiiiiiiiiiiiiiiiiiiiiiiiiiiiiiiiiiiiiiiiiiiiiiiiiiiiiiiiiiiiiiiiiiiiiiiiiiiiiiiiiiiiiiiiiiiiiiiiiiiiiiiiiiiiiiiiiiiiiiiiiiiiiiiiiiiiiiiiiiiiiiiiiiiiiiiiiiiiiiiiiiiiiiiiiiiiiiiiiiiiiiiiiiiiiiiiiiiiiiiiiiiiiiiiiiiiiiiiiiiiiiiiiiiiiiiiiiiiiiiiiiiiiiiiiiiiiiiiiiiiiiiiiiiiiiiiiiiiiiiiiiiiiiiiiiiiiiiiiiiiiiiiiiiiiiiiiiiiiiiiiiiiiiiiiiiiiiiiiiiiiiiiiiiiiiiiiiiiiiiiiiiiiiiiiiiiiiiiiiiiiiiiiiiiiiiiiiiiiiiiiiiiiiiiiiiiiiiiiiiiiiiiiiiiiiiiiiiiiiiiiiiiiiiiiiiiiiiiiiiiiiiiiiiiiiiiiiiiiiiiiiiiiiiiiiiiiiiiiiiiiiiiiiiiiiiiiiiiiiiiiiiiiiiiiiiiiiiiiiiiiiiiiiiiiiiiiiiiiiiiiiiiiiiiiiiiiiiiiiiiiiiiiiiiiiiiiiiiiiiiiiiiiiiiiiiiiiiiiiiiiiiiiiiiiiiiiiiiiiiiiiiiiiiiiiiiiiiiiiiiiiiiiiiiiiiiiiiiiiiiiiiiiiiiiiiiiiiiiiiiiiiiiiiiiiiiiiiiiiiiiiiiiiiiiiiiiiiiiiiiiiiiiiiiiiiiiiiiiiiiiiiiiiiiiiiiiiiiiiiiiiiiiiiiiiiiiiiiiiiiiiiiiiiiiiiiiiiiiiiiiiiiiiiiiiiiiiiiiiiiiiiiiiiiiiiiiiiiiiiiiiiiiiiiiiiiiiiiiiiiiiiiiiiiiiiiiiiiiiiiiiiiiiiiiiiiiiiiiiiiiiiiiiiiiiiiiiiiiiiiiiiiiiiiiiiiiiiiiiiiiiiiiiiiiiiiiiiiiiiiiiiiiiiiiiiiiiiiiiiiiiiiiiiiiiiiiiiiiiiiiiiiiiiiiiiiiiiiiiiiiiiiiiiiiiiiiiiiiiiiiiiiiiiiiiiiiiiiiiiiiiiiiiiiiiiiiiiiiiiiiiiiiiiiiiiiiiiiiiiiiiiiiiiiiiiiiiiiiiiiiiiiiiiiiiiiiiiiiiiiiiiiiiiiiiiiiiiiiiiiiiiiiiiiiiiiiiiiiiiiiiiiiiiiiiiiiiiiiiiiiiiiiiiiiiiiiiiiiiiiiiiiiiiiiiiiiiiiiiiiiiiiiiiiiiiiiiiiiiiiiiiiiiiiiiiiiiiiiiiiiiiiiiiiiiiiiiiiiiiiiiiiiiiiiiiiiiiiiiiiiiiiiiiiiiiiiiiiiiiiiiiiiiiiiiiiiiiiiiiiiiiiiiiiiiiiiiiiiiiiiiiiiiiiiiiiiiiiiiiiiiiiiiiiiiiiiiiiiiiiiiiiiiiiiiiiiiiiiiiiiiiiiiiiiiiiiiiiiiiiiiiiiiiiiiiiiiiiiiiiiiiiiiiiiiiiiiiiiiiiiiiiiiiiiiiiiiiiiiiiiiiiiiiiiiiiiiiiiiiiiiiiiiiiiiiiiiiiiiiiiiiiiiiiiiiiiiiiiiiiiiiiiiiiiiiiiiiiiiiiiiiiiiiiiiiiiiiiiiiiiiiiiiiiiiiiiiiiiiiiiiiiiiiiiiiiiiiiiiiiiiiiiiiiiiiiiiiiiiiiiiiiiiiiiiiiiiiiiiiiiiiiiiiiiiiiiiiiiiiiiiiiiiiiiiiiiiiiiiiiiiiiiiiiiiiiiiiiiiiiiiiiiiiiiiiiiiiiiiiiiiiiiiiiiiiiiiiiiiiiiiiiiiiiiiiiiiiiii iiiiiiiiiiiiiiiiiiiiiiiiiiiiiiiiiiiiiiiiiiiiiiiiiiiiiiiiiiiiiiiiiiiiiiiiiiiiiiiiiiiiiiiiiiiiiiiiiiiiiiiiiiiiiiiiiiiiiiiiiiiiiiiiiiiiiiiiiiiiiiiiiiiiiiiiiiiiiiiiiiiiiiiiiiiiiiiiiiiiiiiiiiiiiiiiiiiiiiiiiiiiiiiiiiiiiiiiiiiiiiiiiiiiiiiiiiiiiiiiiiiiiiiiiiiiiiiiiiiiiiiiiiiiiiiiiiiiiiiiiiiiiiiiiiiiiiiiiiiiiiiiiiiiiiiiiiiiiiiiiiiiiiiiiiiiiiiiiiiiiiiiiiiiiiiiiiiiiiiiiiiiiiiiiiiiiiiiiiiiiiiiiiiiiiiiiiiiiiiiiiiiiiiiiiiiiiiiiiiiiiiiiiiiiiiiiiiiiiiiiiiiiiiiiiiiiiiiiiiiiiiiiiiiiiiiiiiiiiiiiiiiiiiiiiiiiiiiiiiiiiiiiiiiiiiiiiiiiiiiiiiiiiiiiiiiiiiiiiiiiiiiiiiiiiiiiiiiiiiiiiiiiiiiiiiiiiiiiiiiiiiiiiiiiiiiiiiiiiiiiiiiiiiiiiiiiiiiiiiiiiiiiiiiiiiiiiiiiiiiiiiiiiiiiiiiiiiiiiiiiiiiiiiiiiiiiiiiiiiiiiiiiiiiiiiiiiiiiiiiiiiiiiiiiiiiiiiiiiiiiiiiiiiiiiiiiiiiiiiiiiiiiiiiiiiiiiiiiiiiiiiiiiiiiiiiiiiiiiiiiiiiiiiiiiiiiiiiiiiiiiiiiiiiiiiiiiiiiiiiiiiiiiiiiiiiiiiiiiiiiiiiiiiiiiiiiiiiiiiiiiiiiiiiiiiiiiiiiiiiiiiiiiiiiiiiiiiiiiiiiiiiiiiiiiiiiiiiiiiiiiiiiiiiiiiiiiiiiiiiiiiiiiiiiiiiiiiiiiiiiiiiiiiiiiiiiiiiiiiiiiiiiiiiiiiiiiiiiiiiiiiiiiiiiiiiiiiiiiiiiiiiiiiiiiiiiiiiiiiiiiiiiiiiiiiiiiiiiiiiiiiiiiiiiiiiiiiiiiiiiiiiiiiiiiiiiiiiiiiiiiiiiiiiiiiiiiiiiiiiiiiiiiiiiiiiiiiiiiiiiiiiiiiiiiiiiiiiiiiiiiiiiiiiiiiiiiiiiiiiiiiiiiiiiiiiiiiiiiiiiiiiiiiiiiiiiiiiiiiiiiiiiiiiiiiiiiiiiiiiiiiiiiiiiiiiiiiiiiiiiiiiiiiiiiiiiiiiiiiiiiiiiiiiiiiiiiiiiiiiiiiiiiiiiiiiiiiiiiiiiiiiiiiiiiiiiiiiiiiiiiiiiiiiiiiiiiiiiiiiiiiiiiiiiiiiiiiiiiiiiiiiiiiiiiiiiiiiiiiiiiiiiiiiiiiiiiiiiiiiiiiiiiiiiiiiiiiiiiiiiiiiiiiiiiiiiiiiiiiiiiiiiiiiiiiiiiiiiiiiiiiiiiiiiiiiiiiiiiiiiiiiiiiiiiiiiiiiiiiiiiiiiiiiiiiiiiiiiiiiiiiiiiiiiiiiiiiiiiiiiiiiiiiiiiiiiiiiiiiiiiiiiiiiiiiiiiiiiiiiiiiiiiiiiiiiiiiiiiiiiiiiiiiiiiiiiiiiiiiiiiiiiiiiiiiiiiiiiiiiiiiiiiiiiiiiiiiiiiiiiiiiiiiiiiiiiiiiiiiiiiiiiiiiiiiiiiiiiiiiiiiiiiiiiiiiiiiiiiiiiiiiiiiiiiiiiiiiiiiiiiiiiiiiiiiiiiiiiiiiiiiiiiiiiiiiiiiiiiiiiiiiiiiiiiiiiiiiiiiiiiiiiiiiiiiiiiiiiiiiiiiiiiiiiiiiiiiiiiiiiiiiiiiiiiiiiiiiiiiiiiiiiiiiiiiiiiiiiiiiiiiiiiiiiiiiiiiiiiiiiiiiiiiiiiiiiiiiiiiiiiiiiiiiii iiiiiiiiiiiiiiiiiiiiiiiiiiiiiiiiiiiiiiiiiiiiiiiiiiiiiiiiiiiiiiiiiiiiiiiiiiiiiiiiiiiiiiiiiiiiiiiiiiiiiiiiiiiiiiiiiiiiiiiiiiiiiiiiiiiiiiiiiiiiiiiiiiiiiiiiiiiiiiiiiiiiiiiiiiiiiiiiiiiiiiiiiiiiiiiiiiiiiiiiiiiiiiiiiiiiiiiiiiiiiiiiiiiiiiiiiiiiiiiiiiiiiiiiiiiiiiiiiiiiiiiiiiiiiiiiiiiiiiiiiiiiiiiiiiiiiiiiiiiiiiiiiiiiiiiiiiiiiiiiiiiiiiiiiiiiiiiiiiiiiiiiiiiiiiiiiiiiiiiiiiiiiiiiiiiiiiiiiiiiiiiiiiiiiiiiiiiiiiiiiiiiiiiiiiiiiiiiiiiiiiiiiiiiiiiiiiiiiiiiiiiiiiiiiiiiiiiiiiiiiiiiiiiiiiiiiiiiiiiiiiiiiiiiiiiiiiiiiiiiiiiiiiiiiiiiiiiiiiiiiiiiiiiiiiiiiiiiiiiiiiiiiiiiiiiiiiiiiiiiiiiiiiiiiiiiiiiiiiiiiiiiiiiiiiiiiiiiiiiiiiiiiiiiiiiiiiiiiiiiiiiiiiiiiiiiiiiiiiiiiiiiiiiiiiiiiiiiiiiiiiiiiiiiiiiiiiiiiiiiiiiiiiiiiiiiiiiiiiiiiiiiiiiiiiiiiiiiiiiiiiiiiiiiiiiiiiiiiiiiiiiiiiiiiiiiiiiiiiiiiiiiiiiiiiiiiiiiiiiiiiiiiiiiiiiiiiiiiiiiiiiiiiiiiiiiiiiiiiiiiiiiiiiiiiiiiiiiiiiiiiiiiiiiiiiiiiiiiiiiiiiiiiiiiiiiiiiiiiiiiiiiiiiiiiiiiiiiiiiiiiiiiiiiiiiiiiiiiiiiiiiiiiiiiiiiiiiiiiiiiiiiiiiiiiiiiiiiiiiiiiiiiiiiiiiiiiiiiiiiiiiiiiiiiiiiiiiiiiiiiiiiiiiiiiiiiiiiiiiiiiiiiiiiiiiiiiiiiiiiiiiiiiiiiiiiiiiiiiiiiiiiiiiiiiiiiiiiiiiiiiiiiiiiiiiiiiiiiiiiiiiiiiiiiiiiiiiiiiiiiiiiiiiiiiiiiiiiiiiiiiiiiiiiiiiiiiiiiiiiiiiiiiiiiiiiiiiiiiiiiiiiiiiiiiiiiiiiiiiiiiiiiiiiiiiiiiiiiiiiiiiiiiiiiiiiiiiiiiiiiiiiiiiiiiiiiiiiiiiiiiiiiiiiiiiiiiiiiiiiiiiiiiiiiiiiiiiiiiiiiiiiiiiiiiiiiiiiiiiiiiiiiiiiiiiiiiiiiiiiiiiiiiiiiiiiiiiiiiiiiiiiiiiiiiiiiiiiiiiiiiiiiiiiiiiiiiiiiiiiiiiiiiiiiiiiiiiiiiiiiiiiiiiiiiiiiiiiiiiiiiiiiiiiiiiiiiiiiiiiiiiiiiiiiiiiiiiiiiiiiiiiiiiiiiiiiiiiiiiiiiiiiiiiiiiiiiiiiiiiiiiiiiiiiiiiiiiiiiiiiiiiiiiiiiiiiiiiiiiiiiiiiiiiiiiiiiiiiiiiiiiiiiiiiiiiiiiiiiiiiiiiiiiiiiiiiiiiiiiiiiiiiiiiiiiiiiiiiiiiiiiiiiiiiiiiiiiiiiiiiiiiiiiiiiiiiiiiiiiiiiiiiiiiiiiiiiiiiiiiiiiiiiiiiiiiiiiiiiiiiiiiiiiiiiiiiiiiiiiiiiiiiiiiiiiiiiiiiiiiiiiiiiiiiiiiiiiiiiiiiiiiiiiiiiiiiiiiiiiiiiiiiiiiiiiiiiiiiiiiiiiiiiiiiiiiiiiiiiiiiiiiiiiiiiiiiiiiiiiiiiiiiiiiiiiiiiiiiiiiiiiiiiiiiiiiiiiiiiiiiiiiiiiiiiiiiiiiiiiiiiiiiiiiiiiiiiiiiiiiiiiiiiiiii iiiiiiiiiiiiiiiiiiiiiiiiiiiiiiiiiiiiiiiiiiiiiiiiiiiiiiiiiiiiiiiiiiiiiiiiiiiiiiiiiiiiiiiiiiiiiiiiiiiiiiiiiiiiiiiiiiiiiiiiiiiiiiiiiiiiiiiiiiiiiiiiiiiiiiiiiiiiiiiiiiiiiiiiiiiiiiiiiiiiiiiiiiiiiiiiiiiiiiiiiiiiiiiiiiiiiiiiiiiiiiiiiiiiiiiiiiiiiiiiiiiiiiiiiiiiiiiiiiiiiiiiiiiiiiiiiiiiiiiiiiiiiiiiiiiiiiiiiiiiiiiiiiiiiiiiiiiiiiiiiiiiiiiiiiiiiiiiiiiiiiiiiiiiiiiiiiiiiiiiiiiiiiiiiiiiiiiiiiiiiiiiiiiiiiiiiiiiiiiiiiiiiiiiiiiiiiiiiiiiiiiiiiiiiiiiiiiiiiiiiiiiiiiiiiiiiiiiiiiiiiiiiiiiiiiiiiiiiiiiiiiiiiiiiiiiiiiiiiiiiiiiiiiiiiiiiiiiiiiiiiiiiiiiiiiiiiiiiiiiiiiiiiiiiiiiiiiiiiiiiiiiiiiiiiiiiiiiiiiiiiiiiiiiiiiiiiiiiiiiiiiiiiiiiiiiiiiiiiiiiiiiiiiiiiiiiiiiiiiiiiiiiiiiiiiiiiiiiiiiiiiiiiiiiiiiiiiiiiiiiiiiiiiiiiiiiiiiiiiiiiiiiiiiiiiiiiiiiiiiiiiiiiiiiiiiiiiiiiiiiiiiiiiiiiiiiiiiiiiiiiiiiiiiiiiiiiiiiiiiiiiiiiiiiiiiiiiiiiiiiiiiiiiiiiiiiiiiiiiiiiiiiiiiiiiiiiiiiiiiiiiiiiiiiiiiiiiiiiiiiiiiiiiiiiiiiiiiiiiiiiiiiiiiiiiiiiiiiiiiiiiiiiiiiiiiiiiiiiiiiiiiiiiiiiiiiiiiiiiiiiiiiiiiiiiiiiiiiiiiiiiiiiiiiiiiiiiiiiiiiiiiiiiiiiiiiiiiiiiiiiiiiiiiiiiiiiiiiiiiiiiiiiiiiiiiiiiiiiiiiiiiiiiiiiiiiiiiiiiiiiiiiiiiiiiiiiiiiiiiiiiiiiiiiiiiiiiiiiiiiiiiiiiiiiiiiiiiiiiiiiiiiiiiiiiiiiiiiiiiiiiiiiiiiiiiiiiiiiiiiiiiiiiiiiiiiiiiiiiiiiiiiiiiiiiiiiiiiiiiiiiiiiiiiiiiiiiiiiiiiiiiiiiiiiiiiiiiiiiiiiiiiiiiiiiiiiiiiiiiiiiiiiiiiiiiiiiiiiiiiiiiiiiiiiiiiiiiiiiiiiiiiiiiiiiiiiiiiiiiiiiiiiiiiiiiiiiiiiiiiiiiiiiiiiiiiiiiiiiiiiiiiiiiiiiiiiiiiiiiiiiiiiiiiiiiiiiiiiiiiiiiiiiiiiiiiiiiiiiiiiiiiiiiiiiiiiiiiiiiiiiiiiiiiiiiiiiiiiiiiiiiiiiiiiiiiiiiiiiiiiiiiiiiiiiiiiiiiiiiiiiiiiiiiiiiiiiiiiiiiiiiiiiiiiiiiiiiiiiiiiiiiiiiiiiiiiiiiiiiiiiiiiiiiiiiiiiiiiiiiiiiiiiiiiiiiiiiiiiiiiiiiiiiiiiiiiiiiiiiiiiiiiiiiiiiiiiiiiiiiiiiiiiiiiiiiiiiiiiiiiiiiiiiiiiiiiiiiiiiiiiiiiiiiiiiiiiiiiiiiiiiiiiiiiiiiiiiiiiiiiiiiiiiiiiiiiiiiiiiiiiiiiiiiiiiiiiiiiiiiiiiiiiiiiiiiiiiiiiiiiiiiiiiiiiiiiiiiiiiiiiiiiiiiiiiiiiiiiiiiiiiiiiiiiiiiiiiiiiiiiiiiiiiiiiiiiiiiiiiiiiiiiiiiiiiiiiiiiiiiiiiiiiiiiiiiiiiiiiiiiiiiiiiiiiiiiiiiiiiiiiiiiiiiiiiiiiiiiiiiiiiiiiiii iiiiiiiiiiiiiiiiiiiiiiiiiiiiiiiiiiiiiiiiiiiiiiiiiiiiiiiiiiiiiiiiiiiiiiiiiiiiiiiiiiiiiiiiiiiiiiiiiiiiiiiiiiiiiiiiiiiiiiiiiiiiiiiiiiiiiiiiiiiiiiiiiiiiiiiiiiiiiiiiiiiiiiiiiiiiiiiiiiiiiiiiiiiiiiiiiiiiiiiiiiiiiiiiiiiiiiiiiiiiiiiiiiiiiiiiiiiiiiiiiiiiiiiiiiiiiiiiiiiiiiiiiiiiiiiiiiiiiiiiiiiiiiiiiiiiiiiiiiiiiiiiiiiiiiiiiiiiiiiiiiiiiiiiiiiiiiiiiiiiiiiiiiiiiiiiiiiiiiiiiiiiiiiiiiiiiiiiiiiiiiiiiiiiiiiiiiiiiiiiiiiiiiiiiiiiiiiiiiiiiiiiiiiiiiiiiiiiiiiiiiiiiiiiiiiiiiiiiiiiiiiiiiiiiiiiiiiiiiiiiiiiiiiiiiiiiiiiiiiiiiiiiiiiiiiiiiiiiiiiiiiiiiiiiiiiiiiiiiiiiiiiiiiiiiiiiiiiiiiiiiiiiiiiiiiiiiiiiiiiiiiiiiiiiiiiiiiiiiiiiiiiiiiiiiiiiiiiiiiiiiiiiiiiiiiiiiiiiiiiiiiiiiiiiiiiiiiiiiiiiiiiiiiiiiiiiiiiiiiiiiiiiiiiiiiiiiiiiiiiiiiiiiiiiiiiiiiiiiiiiiiiiiiiiiiiiiiiiiiiiiiiiiiiiiiiiiiiiiiiiiiiiiiiiiiiiiiiiiiiiiiiiiiiiiiiiiiiiiiiiiiiiiiiiiiiiiiiiiiiiiiiiiiiiiiiiiiiiiiiiiiiiiiiiiiiiiiiiiiiiiiiiiiiiiiiiiiiiiiiiiiiiiiiiiiiiiiiiiiiiiiiiiiiiiiiiiiiiiiiiiiiiiiiiiiiiiiiiiiiiiiiiiiiiiiiiiiiiiiiiiiiiiiiiiiiiiiiiiiiiiiiiiiiiiiiiiiiiiiiiiiiiiiiiiiiiiiiiiiiiiiiiiiiiiiiiiiiiiiiiiiiiiiiiiiiiiiiiiiiiiiiiiiiiiiiiiiiiiiiiiiiiiiiiiiiiiiiiiiiiiiiiiiiiiiiiiiiiiiiiiiiiiiiiiiiiiiiiiiiiiiiiiiiiiiiiiiiiiiiiiiiiiiiiiiiiiiiiiiiiiiiiiiiiiiiiiiiiiiiiiiiiiiiiiiiiiiiiiiiiiiiiiiiiiiiiiiiiiiiiiiiiiiiiiiiiiiiiiiiiiiiiiiiiiiiiiiiiiiiiiiiiiiiiiiiiiiiiiiiiiiiiiiiiiiiiiiiiiiiiiiiiiiiiiiiiiiiiiiiiiiiiiiiiiiiiiiiiiiiiiiiiiiiiiiiiiiiiiiiiiiiiiiiiiiiiiiiiiiiiiiiiiiiiiiiiiiiiiiiiiiiiiiiiiiiiiiiiiiiiiiiiiiiiiiiiiiiiiiiiiiiiiiiiiiiiiiiiiiiiiiiiiiiiiiiiiiiiiiiiiiiiiiiiiiiiiiiiiiiiiiiiiiiiiiiiiiiiiiiiiiiiiiiiiiiiiiiiiiiiiiiiiiiiiiiiiiiiiiiiiiiiiiiiiiiiiiiiiiiiiiiiiiiiiiiiiiiiiiiiiiiiiiiiiiiiiiiiiiiiiiiiiiiiiiiiiiiiiiiiiiiiiiiiiiiiiiiiiiiiiiiiiiiiiiiiiiiiiiiiiiiiiiiiiiiiiiiiiiiiiiiiiiiiiiiiiiiiiiiiiiiiiiiiiiiiiiiiiiiiiiiiiiiiiiiiiiiiiiiiiiiiiiiiiiiiiiiiiiiiiiiiiiiiiiiiiiiiiiiiiiiiiiiiiiiiiiiiiiiiiiiiiiiiiiiiiiiiiiiiiiiiiiiiiiiiiiiiiiiiiiiiiiiiiiiiiiiiiiiiiiiiiiiiiiiiiiiiiiiiiiiiiiiiiiiiiiiiiiiiiiiiiiiii iiiiiiiiiiiiiiiiiiiiiiiiiiiiiiiiiiiiiiiiiiiiiiiiiiiiiiiiiiiiiiiiiiiiiiiiiiiiiiiiiiiiiiiiiiiiiiiiiiiiiiiiiiiiiiiiiiiiiiiiiiiiiiiiiiiiiiiiiiiiiiiiiiiiiiiiiiiiiiiiiiiiiiiiiiiiiiiiiiiiiiiiiiiiiiiiiiiiiiiiiiiiiiiiiiiiiiiiiiiiiiiiiiiiiiiiiiiiiiiiiiiiiiiiiiiiiiiiiiiiiiiiiiiiiiiiiiiiiiiiiiiiiiiiiiiiiiiiiiiiiiiiiiiiiiiiiiiiiiiiiiiiiiiiiiiiiiiiiiiiiiiiiiiiiiiiiiiiiiiiiiiiiiiiiiiiiiiiiiiiiiiiiiiiiiiiiiiiiiiiiiiiiiiiiiiiiiiiiiiiiiiiiiiiiiiiiiiiiiiiiiiiiiiiiiiiiiiiiiiiiiiiiiiiiiiiiiiiiiiiiiiiiiiiiiiiiiiiiiiiiiiiiiiiiiiiiiiiiiiiiiiiiiiiiiiiiiiiiiiiiiiiiiiiiiiiiiiiiiiiiiiiiiiiiiiiiiiiiiiiiiiiiiiiiiiiiiiiiiiiiiiiiiiiiiiiiiiiiiiiiiiiiiiiiiiiiiiiiiiiiiiiiiiiiiiiiiiiiiiiiiiiiiiiiiiiiiiiiiiiiiiiiiiiiiiiiiiiiiiiiiiiiiiiiiiiiiiiiiiiiiiiiiiiiiiiiiiiiiiiiiiiiiiiiiiiiiiiiiiiiiiiiiiiiiiiiiiiiiiiiiiiiiiiiiiiiiiiiiiiiiiiiiiiiiiiiiiiiiiiiiiiiiiiiiiiiiiiiiiiiiiiiiiiiiiiiiiiiiiiiiiiiiiiiiiiiiiiiiiiiiiiiiiiiiiiiiiiiiiiiiiiiiiiiiiiiiiiiiiiiiiiiiiiiiiiiiiiiiiiiiiiiiiiiiiiiiiiiiiiiiiiiiiiiiiiiiiiiiiiiiiiiiiiiiiiiiiiiiiiiiiiiiiiiiiiiiiiiiiiiiiiiiiiiiiiiiiiiiiiiiiiiiiiiiiiiiiiiiiiiiiiiiiiiiiiiiiiiiiiiiiiiiiiiiiiiiiiiiiiiiiiiiiiiiiiiiiiiiiiiiiiiiiiiiiiiiiiiiiiiiiiiiiiiiiiiiiiiiiiiiiiiiiiiiiiiiiiiiiiiiiiiiiiiiiiiiiiiiiiiiiiiiiiiiiiiiiiiiiiiiiiiiiiiiiiiiiiiiiiiiiiiiiiiiiiiiiiiiiiiiiiiiiiiiiiiiiiiiiiiiiiiiiiiiiiiiiiiiiiiiiiiiiiiiiiiiiiiiiiiiiiiiiiiiiiiiiiiiiiiiiiiiiiiiiiiiiiiiiiiiiiiiiiiiiiiiiiiiiiiiiiiiiiiiiiiiiiiiiiiiiiiiiiiiiiiiiiiiiiiiiiiiiiiiiiiiiiiiiiiiiiiiiiiiiiiiiiiiiiiiiiiiiiiiiiiiiiiiiiiiiiiiiiiiiiiiiiiiiiiiiiiiiiiiiiiiiiiiiiiiiiiiiiiiiiiiiiiiiiiiiiiiiiiiiiiiiiiiiiiiiiiiiiiiiiiiiiiiiiiiiiiiiiiiiiiiiiiiiiiiiiiiiiiiiiiiiiiiiiiiiiiiiiiiiiiiiiiiiiiiiiiiiiiiiiiiiiiiiiiiiiiiiiiiiiiiiiiiiiiiiiiiiiiiiiiiiiiiiiiiiiiiiiiiiiiiiiiiiiiiiiiiiiiiiiiiiiiiiiiiiiiiiiiiiiiiiiiiiiiiiiiiiiiiiiiiiiiiiiiiiiiiiiiiiiiiiiiiiiiiiiiiiiiiiiiiiiiiiiiiiiiiiiiiiiiiiiiiiiiiiiiiiiiiiiiiiiiiiiiiiiiiiiiiiiiiiiiiiiiiiiiiiiiiiiiiiiiiiiiiiiiiiiiiiiiiiiiiiiiiiiiiiiiiiiiiiiiiiiiiiiiiiiii iiiiiiiiiiiiiiiiiiiiiiiiiiiiiiiiiiiiiiiiiiiiiiiiiiiiiiiiiiiiiiiiiiiiiiiiiiiiiiiiiiiiiiiiiiiiiiiiiiiiiiiiiiiiiiiiiiiiiiiiiiiiiiiiiiiiiiiiiiiiiiiiiiiiiiiiiiiiiiiiiiiiiiiiiiiiiiiiiiiiiiiiiiiiiiiiiiiiiiiiiiiiiiiiiiiiiiiiiiiiiiiiiiiiiiiiiiiiiiiiiiiiiiiiiiiiiiiiiiiiiiiiiiiiiiiiiiiiiiiiiiiiiiiiiiiiiiiiiiiiiiiiiiiiiiiiiiiiiiiiiiiiiiiiiiiiiiiiiiiiiiiiiiiiiiiiiiiiiiiiiiiiiiiiiiiiiiiiiiiiiiiiiiiiiiiiiiiiiiiiiiiiiiiiiiiiiiiiiiiiiiiiiiiiiiiiiiiiiiiiiiiiiiiiiiiiiiiiiiiiiiiiiiiiiiiiiiiiiiiiiiiiiiiiiiiiiiiiiiiiiiiiiiiiiiiiiiiiiiiiiiiiiiiiiiiiiiiiiiiiiiiiiiiiiiiiiiiiiiiiiiiiiiiiiiiiiiiiiiiiiiiiiiiiiiiiiiiiiiiiiiiiiiiiiiiiiiiiiiiiiiiiiiiiiiiiiiiiiiiiiiiiiiiiiiiiiiiiiiiiiiiiiiiiiiiiiiiiiiiiiiiiiiiiiiiiiiiiiiiiiiiiiiiiiiiiiiiiiiiiiiiiiiiiiiiiiiiiiiiiiiiiiiiiiiiiiiiiiiiiiiiiiiiiiiiiiiiiiiiiiiiiiiiiiiiiiiiiiiiiiiiiiiiiiiiiiiiiiiiiiiiiiiiiiiiiiiiiiiiiiiiiiiiiiiiiiiiiiiiiiiiiiiiiiiiiiiiiiiiiiiiiiiiiiiiiiiiiiiiiiiiiiiiiiiiiiiiiiiiiiiiiiiiiiiiiiiiiiiiiiiiiiiiiiiiiiiiiiiiiiiiiiiiiiiiiiiiiiiiiiiiiiiiiiiiiiiiiiiiiiiiiiiiiiiiiiiiiiiiiiiiiiiiiiiiiiiiiiiiiiiiiiiiiiiiiiiiiiiiiiiiiiiiiiiiiiiiiiiiiiiiiiiiiiiiiiiiiiiiiiiiiiiiiiiiiiiiiiiiiiiiiiiiiiiiiiiiiiiiiiiiiiiiiiiiiiiiiiiiiiiiiiiiiiiiiiiiiiiiiiiiiiiiiiiiiiiiiiiiiiiiiiiiiiiiiiiiiiiiiiiiiiiiiiiiiiiiiiiiiiiiiiiiiiiiiiiiiiiiiiiiiiiiiiiiiiiiiiiiiiiiiiiiiiiiiiiiiiiiiiiiiiiiiiiiiiiiiiiiiiiiiiiiiiiiiiiiiiiiiiiiiiiiiiiiiiiiiiiiiiiiiiiiiiiiiiiiiiiiiiiiiiiiiiiiiiiiiiiiiiiiiiiiiiiiiiiiiiiiiiiiiiiiiiiiiiiiiiiiiiiiiiiiiiiiiiiiiiiiiiiiiiiiiiiiiiiiiiiiiiiiiiiiiiiiiiiiiiiiiiiiiiiiiiiiiiiiiiiiiiiiiiiiiiiiiiiiiiiiiiiiiiiiiiiiiiiiiiiiiiiiiiiiiiiiiiiiiiiiiiiiiiiiiiiiiiiiiiiiiiiiiiiiiiiiiiiiiiiiiiiiiiiiiiiiiiiiiiiiiiiiiiiiiiiiiiiiiiiiiiiiiiiiiiiiiiiiiiiiiiiiiiiiiiiiiiiiiiiiiiiiiiiiiiiiiiiiiiiiiiiiiiiiiiiiiiiiiiiiiiiiiiiiiiiiiiiiiiiiiiiiiiiiiiiiiiiiiiiiiiiiiiiiiiiiiiiiiiiiiiiiiiiiiiiiiiiiiiiiiiiiiiiiiiiiiiiiiiiiiiiiiiiiiiiiiiiiiiiiiiiiiiiiiiiiiiiiiiiiiiiiiiiiiiiiiiiiiiiiiiiiiiiiiiiiiiiiiiiiiiiiiiiiiiiiiiiiiiiiiiiiiiiiiii iiiiiiiiiiiiiiiiiiiiiiiiiiiiiiiiiiiiiiiiiiiiiiiiiiiiiiiiiiiiiiiiiiiiiiiiiiiiiiiiiiiiiiiiiiiiiiiiiiiiiiiiiiiiiiiiiiiiiiiiiiiiiiiiiiiiiiiiiiiiiiiiiiiiiiiiiiiiiiiiiiiiiiiiiiiiiiiiiiiiiiiiiiiiiiiiiiiiiiiiiiiiiiiiiiiiiiiiiiiiiiiiiiiiiiiiiiiiiiiiiiiiiiiiiiiiiiiiiiiiiiiiiiiiiiiiiiiiiiiiiiiiiiiiiiiiiiiiiiiiiiiiiiiiiiiiiiiiiiiiiiiiiiiiiiiiiiiiiiiiiiiiiiiiiiiiiiiiiiiiiiiiiiiiiiiiiiiiiiiiiiiiiiiiiiiiiiiiiiiiiiiiiiiiiiiiiiiiiiiiiiiiiiiiiiiiiiiiiiiiiiiiiiiiiiiiiiiiiiiiiiiiiiiiiiiiiiiiiiiiiiiiiiiiiiiiiiiiiiiiiiiiiiiiiiiiiiiiiiiiiiiiiiiiiiiiiiiiiiiiiiiiiiiiiiiiiiiiiiiiiiiiiiiiiiiiiiiiiiiiiiiiiiiiiiiiiiiiiiiiiiiiiiiiiiiiiiiiiiiiiiiiiiiiiiiiiiiiiiiiiiiiiiiiiiiiiiiiiiiiiiiiiiiiiiiiiiiiiiiiiiiiiiiiiiiiiiiiiiiiiiiiiiiiiiiiiiiiiiiiiiiiiiiiiiiiiiiiiiiiiiiiiiiiiiiiiiiiiiiiiiiiiiiiiiiiiiiiiiiiiiiiiiiiiiiiiiiiiiiiiiiiiiiiiiiiiiiiiiiiiiiiiiiiiiiiiiiiiiiiiiiiiiiiiiiiiiiiiiiiiiiiiiiiiiiiiiiiiiiiiiiiiiiiiiiiiiiiiiiiiiiiiiiiiiiiiiiiiiiiiiiiiiiiiiiiiiiiiiiiiiiiiiiiiiiiiiiiiiiiiiiiiiiiiiiiiiiiiiiiiiiiiiiiiiiiiiiiiiiiiiiiiiiiiiiiiiiiiiiiiiiiiiiiiiiiiiiiiiiiiiiiiiiiiiiiiiiiiiiiiiiiiiiiiiiiiiiiiiiiiiiiiiiiiiiiiiiiiiiiiiiiiiiiiiiiiiiiiiiiiiiiiiiiiiiiiiiiiiiiiiiiiiiiiiiiiiiiiiiiiiiiiiiiiiiiiiiiiiiiiiiiiiiiiiiiiiiiiiiiiiiiiiiiiiiiiiiiiiiiiiiiiiiiiiiiiiiiiiiiiiiiiiiiiiiiiiiiiiiiiiiiiiiiiiiiiiiiiiiiiiiiiiiiiiiiiiiiiiiiiiiiiiiiiiiiiiiiiiiiiiiiiiiiiiiiiiiiiiiiiiiiiiiiiiiiiiiiiiiiiiiiiiiiiiiiiiiiiiiiiiiiiiiiiiiiiiiiiiiiiiiiiiiiiiiiiiiiiiiiiiiiiiiiiiiiiiiiiiiiiiiiiiiiiiiiiiiiiiiiiiiiiiiiiiiiiiiiiiiiiiiiiiiiiiiiiiiiiiiiiiiiiiiiiiiiiiiiiiiiiiiiiiiiiiiiiiiiiiiiiiiiiiiiiiiiiiiiiiiiiiiiiiiiiiiiiiiiiiiiiiiiiiiiiiiiiiiiiiiiiiiiiiiiiiiiiiiiiiiiiiiiiiiiiiiiiiiiiiiiiiiiiiiiiiiiiiiiiiiiiiiiiiiiiiiiiiiiiiiiiiiiiiiiiiiiiiiiiiiiiiiiiiiiiiiiiiiiiiiiiiiiiiiiiiiiiiiiiiiiiiiiiiiiiiiiiiiiiiiiiiiiiiiiiiiiiiiiiiiiiiiiiiiiiiiiiiiiiiiiiiiiiiiiiiiiiiiiiiiiiiiiiiiiiiiiiiiiiiiiiiiiiiiiiiiiiiiiiiiiiiiiiiiiiiiiiiiiiiiiiiiiiiiiiiiiiiiiiiiiiiiiiiiiiiiiiiiiiiiiiiiiiiiiiiiiiiiiiiiiiiiiiiiiii iiiiiiiiiiiiiiiiiiiiiiiiiiiiiiiiiiiiiiiiiiiiiiiiiiiiiiiiiiiiiiiiiiiiiiiiiiiiiiiiiiiiiiiiiiiiiiiiiiiiiiiiiiiiiiiiiiiiiiiiiiiiiiiiiiiiiiiiiiiiiiiiiiiiiiiiiiiiiiiiiiiiiiiiiiiiiiiiiiiiiiiiiiiiiiiiiiiiiiiiiiiiiiiiiiiiiiiiiiiiiiiiiiiiiiiiiiiiiiiiiiiiiiiiiiiiiiiiiiiiiiiiiiiiiiiiiiiiiiiiiiiiiiiiiiiiiiiiiiiiiiiiiiiiiiiiiiiiiiiiiiiiiiiiiiiiiiiiiiiiiiiiiiiiiiiiiiiiiiiiiiiiiiiiiiiiiiiiiiiiiiiiiiiiiiiiiiiiiiiiiiiiiiiiiiiiiiiiiiiiiiiiiiiiiiiiiiiiiiiiiiiiiiiiiiiiiiiiiiiiiiiiiiiiiiiiiiiiiiiiiiiiiiiiiiiiiiiiiiiiiiiiiiiiiiiiiiiiiiiiiiiiiiiiiiiiiiiiiiiiiiiiiiiiiiiiiiiiiiiiiiiiiiiiiiiiiiiiiiiiiiiiiiiiiiiiiiiiiiiiiiiiiiiiiiiiiiiiiiiiiiiiiiiiiiiiiiiiiiiiiiiiiiiiiiiiiiiiiiiiiiiiiiiiiiiiiiiiiiiiiiiiiiiiiiiiiiiiiiiiiiiiiiiiiiiiiiiiiiiiiiiiiiiiiiiiiiiiiiiiiiiiiiiiiiiiiiiiiiiiiiiiiiiiiiiiiiiiiiiiiiiiiiiiiiiiiiiiiiiiiiiiiiiiiiiiiiiiiiiiiiiiiiiiiiiiiiiiiiiiiiiiiiiiiiiiiiiiiiiiiiiiiiiiiiiiiiiiiiiiiiiiiiiiiiiiiiiiiiiiiiiiiiiiiiiiiiiiiiiiiiiiiiiiiiiiiiiiiiiiiiiiiiiiiiiiiiiiiiiiiiiiiiiiiiiiiiiiiiiiiiiiiiiiiiiiiiiiiiiiiiiiiiiiiiiiiiiiiiiiiiiiiiiiiiiiiiiiiiiiiiiiiiiiiiiiiiiiiiiiiiiiiiiiiiiiiiiiiiiiiiiiiiiiiiiiiiiiiiiiiiiiiiiiiiiiiiiiiiiiiiiiiiiiiiiiiiiiiiiiiiiiiiiiiiiiiiiiiiiiiiiiiiiiiiiiiiiiiiiiiiiiiiiiiiiiiiiiiiiiiiiiiiiiiiiiiiiiiiiiiiiiiiiiiiiiiiiiiiiiiiiiiiiiiiiiiiiiiiiiiiiiiiiiiiiiiiiiiiiiiiiiiiiiiiiiiiiiiiiiiiiiiiiiiiiiiiiiiiiiiiiiiiiiiiiiiiiiiiiiiiiiiiiiiiiiiiiiiiiiiiiiiiiiiiiiiiiiiiiiiiiiiiiiiiiiiiiiiiiiiiiiiiiiiiiiiiiiiiiiiiiiiiiiiiiiiiiiiiiiiiiiiiiiiiiiiiiiiiiiiiiiiiiiiiiiiiiiiiiiiiiiiiiiiiiiiiiiiiiiiiiiiiiiiiiiiiiiiiiiiiiiiiiiiiiiiiiiiiiiiiiiiiiiiiiiiiiiiiiiiiiiiiiiiiiiiiiiiiiiiiiiiiiiiiiiiiiiiiiiiiiiiiiiiiiiiiiiiiiiiiiiiiiiiiiiiiiiiiiiiiiiiiiiiiiiiiiiiiiiiiiiiiiiiiiiiiiiiiiiiiiiiiiiiiiiiiiiiiiiiiiiiiiiiiiiiiiiiiiiiiiiiiiiiiiiiiiiiiiiiiiiiiiiiiiiiiiiiiiiiiiiiiiiiiiiiiiiiiiiiiiiiiiiiiiiiiiiiiiiiiiiiiiiiiiiiiiiiiiiiiiiiiiiiiiiiiiiiiiiiiiiiiiiiiiiiiiiiiiiiiiiiiiiiiiiiiiiiiiiiiiiiiiiiiiiiiiiiiiiiiiiiiiiiiiiiiiiiiiiiiiiiiiiiiiiiiiiiiiiiiiiiiiiii [employer] [place]  Pulmonary:     Effort: Pulmonary effort is normal.  Skin:    General: Skin is warm and dry.  Neurological:     Mental Status: He is alert and oriented to person, place, and time.     Motor: Weakness present.     Gait: Gait abnormal.  Psychiatric:        Mood and Affect: Mood normal.        Behavior: Behavior normal.        Thought Content: Thought content normal.        Judgment: Judgment normal.    BP (!) 140/69 (BP Location: Right Arm)   Pulse 78   Resp 18   Ht 5\' 10"  (1.778 m)   Wt (!) 307 lb 6.4 oz (139.4 kg)   BMI 44.11 kg/m   Past Medical History:  Diagnosis Date  . (HFpEF) heart failure with preserved ejection fraction (HCC)    a.) TTE 11/26/2021: EF 45%, mod LVH, post HK, mod LAE, mild MR/TR, G2DD; b.) TTE 02/26/2022: EF 55-60%, BAE, triv MR, G2DD; c.) TTE 07/09/2022: EF 45%, post HK, LVH, mod LAE, triv TR/PR, mild MR, G1DD  . Anemia of chronic renal failure   . Aortic atherosclerosis (HCC)   . Asthma   . Bell's palsy   . BPH (benign prostatic hyperplasia)   . CAD (coronary artery disease)    a.) LHC 12/31/2021: 60% mLAD, 20% pRCA, 20% dRCA, 20% o-pLCx --> med mgmt.  . CKD (chronic kidney disease), stage IV (HCC)   . Diabetic neuropathy (HCC)   . Dyspnea on exertion   . Ganglion cyst of wrist, right    a.) s/p excision 04/2011  . Hepatic steatosis   . History of 2019 novel coronavirus disease (COVID-19)   . History of bilateral cataract extraction 2018  . Hyperlipidemia   . Hyperparathyroidism due to renal insufficiency (HCC)   . Hypertension   . Low testosterone in male   . Nephrolithiasis   . Obesity   . OSA treated with BiPAP   . Osteoarthritis   . Peripheral edema   . Right inguinal hernia    a.) s/p repair  . Skin cancer   . Type 2 diabetes mellitus with renal manifestations [facility])      Social History   Socioeconomic History  . Marital status: Married    Spouse name: Not on file  . Number of children: Not on file  . Years of education: Not on file  . Highest education level: Not on file  Occupational History  . Not on file  Tobacco Use  . Smoking status: Never    Passive exposure: Never  . Smokeless tobacco: Never  Vaping Use  .  Vaping Use: Never used  Substance and Sexual Activity  . Alcohol use: No  . Drug use: Not Currently  . Sexual activity: Yes    Birth control/protection: None  Other Topics Concern  . Not on file  Social History Narrative  . Not on file   Social Determinants of Health   Financial Resource Strain: Not on file  Food Insecurity: No Food Insecurity (12/13/2022)   Hunger Vital Sign   . Worried About Programme researcher, broadcasting/film/video in the Last Year: Never true   . Ran Out of Food in the Last Year: Never true  Transportation Needs: No Transportation Needs (12/13/2022)   PRAPARE - Transportation   . Lack of Transportation (Medical): No   . Lack of Transportation (Non-Medical): No  Physical Activity: Not on file  Stress: Not on file  Social Connections: Not on file  Intimate Partner Violence: Not At Risk (12/13/2022)   Humiliation, Afraid, Rape, and Kick questionnaire   . Fear of Current or Ex-Partner: No   . Emotionally Abused: No   . Physically Abused: No   . Sexually Abused: No    Past Surgical History:  Procedure Laterality Date  . APPENDECTOMY    . AV FISTULA PLACEMENT Left 09/19/2022   Procedure: ARTERIOVENOUS (AV) FISTULA CREATION (RADIO CEPHALIC);  Surgeon: Annice Needy, MD;  Location: [facility];  Service: Vascular;  Laterality: Left;  . CATARACT EXTRACTION W/PHACO Left 05/20/2017   Procedure: CATARACT EXTRACTION PHACO AND INTRAOCULAR LENS PLACEMENT (IOC);  Surgeon: Galen Manila, MD;  Location: [facility];  Service: Ophthalmology;  Laterality: Left;  [place] 00:32.0 AP% 15.5 CDE 4.97 Fluid Pack Lot # [id number]  . CATARACT EXTRACTION  W/PHACO Right 06/10/2017   Procedure: CATARACT EXTRACTION PHACO AND INTRAOCULAR LENS PLACEMENT (IOC);  Surgeon: Galen Manila, MD;  Location: [facility];  Service: Ophthalmology;  Laterality: Right;  [place]  00:51 AP% 15.4 CDE 7.95 Fluid pack lot # [id number]  . CHOLECYSTECTOMY    . COLONOSCOPY     x3  . CYSTOSCOPY W/ RETROGRADES Right 08/13/2019   Procedure: CYSTOSCOPY WITH RETROGRADE PYELOGRAM;  Surgeon: Sondra Come, MD;  Location: [facility];  Service: Urology;  Laterality: Right;  . CYSTOSCOPY WITH STENT PLACEMENT Right 07/26/2019   Procedure: CYSTOSCOPY WITH STENT PLACEMENT;  Surgeon: Crista Elliot, MD;  Location: [facility];  Service: Urology;  Laterality: Right;  . CYSTOSCOPY/URETEROSCOPY/HOLMIUM LASER/STENT PLACEMENT Right 08/13/2019   Procedure: CYSTOSCOPY/URETEROSCOPY/HOLMIUM LASER/STENT EXCHANGE;  Surgeon: Sondra Come, MD;  Location: [facility];  Service: Urology;  Laterality: Right;  . CYSTOSCOPY/URETEROSCOPY/HOLMIUM LASER/STENT PLACEMENT Left 02/27/2022   Procedure: CYSTOSCOPY/URETEROSCOPY/HOLMIUM LASER/STENT PLACEMENT;  Surgeon: Riki Altes, MD;  Location: [facility];  Service: Urology;  Laterality: Left;  . GANGLION CYST EXCISION Right   . INGUINAL HERNIA REPAIR Right   . LEFT HEART CATH AND CORONARY ANGIOGRAPHY N/A 12/31/2021   Procedure: LEFT HEART CATH AND CORONARY ANGIOGRAPHY;  Surgeon: Lamar Blinks, MD;  Location: [facility];  Service: Cardiovascular;  Laterality: N/A;  . SHOULDER ARTHROSCOPY WITH SUBACROMIAL DECOMPRESSION AND OPEN ROTATOR C Right 09/11/2020   Procedure: Right shoulder arthroscopic rotator cuff repair vs Regeneten patch application, subacromial decompression, and biceps tenodesis - Dedra Skeens to Assist;  Surgeon: Signa Kell, MD;  Location: [facility];  Service: Orthopedics;  Laterality: Right;  . TEMPORARY DIALYSIS CATHETER N/A 03/05/2022   Procedure: TEMPORARY DIALYSIS CATHETER;  Surgeon: Renford Dills, MD;  Location: [facility]  INV[facility];  Service: Cardiovascular;  Laterality: N/A;  . TONSILLECTOMY    .  URETEROSCOPY WITH HOLMIUM LASER LITHOTRIPSY Left 02/27/2022   Procedure: URETEROSCOPY WITH HOLMIUM LASER LITHOTRIPSY;  Surgeon: Riki Altes, MD;  Location: [facility];  Service: Urology;  Laterality: Left;  . WRIST FRACTURE SURGERY Right     Family History  Problem Relation Age of Onset  . Emphysema Mother   . COPD Mother   . Heart disease Mother   . Brain cancer Father     Allergies  Allergen Reactions  . Codeine Nausea And Vomiting  . Doxycycline   . Erythromycin Rash       Latest Ref Rng & Units 12/17/2022    4:22 AM 12/13/2022    2:00 PM 09/19/2022    6:36 AM  CBC  WBC 4.0 - 10.5 K/uL 12.3  14.3    Hemoglobin 13.0 - 17.0 g/dL 9.9  9.9  16.1   Hematocrit 39.0 - 52.0 % 32.2  32.4  32.0   Platelets 150 - 400 K/uL 377  337        CMP     Component Value Date/Time   NA 143 12/17/2022 0422   NA 141 04/10/2022 0000   NA 137 11/27/2013 0412   K 4.5 12/17/2022 0422   K 3.5 11/27/2013 0412   CL 108 12/17/2022 0422   CL 105 11/27/2013 0412   CO2 25 12/17/2022 0422   CO2 25 11/27/2013 0412   GLUCOSE 207 (H) 12/17/2022 0422   GLUCOSE 98 11/27/2013 0412   BUN 80 (H) 12/17/2022 0422   BUN 42 (H) 11/27/2013 0412   CREATININE 3.57 (H) 12/17/2022 0422   CREATININE 2.14 (H) 11/27/2013 0412   CALCIUM 8.5 (L) 12/17/2022 0422   CALCIUM 7.6 (L) 11/27/2013 0412   PROT 7.0 02/26/2022 0948   PROT 7.4 11/27/2013 0412   ALBUMIN 2.8 (L) 12/13/2022 1025   ALBUMIN 3.1 (L) 11/27/2013 0412   AST 21 02/26/2022 0948   AST 47 (H) 11/27/2013 0412   ALT 15 02/26/2022 0948   ALT 41 11/27/2013 0412   ALKPHOS 69 02/26/2022 0948   ALKPHOS 82 11/27/2013 0412   BILITOT 0.7 02/26/2022 0948   BILITOT 0.4 11/27/2013 0412   GFRNONAA 16 (L) 12/17/2022 0422   GFRNONAA 30 (L) 11/27/2013 0412   GFRAA 30 (L) 03/13/2020 1531   GFRAA 34 (L) 11/27/2013 0412     No results found.     Assessment & Plan:   1.  Chronic kidney disease (CKD), stage IV (severe) (HCC) Today the patient has a good thrill.  Previous study showed an acceptable flow volume.  However the more proximal the fistula is palpated it becomes very difficult to palpate.  It is evident that this still needs some assistance with maturation.  Will plan on having the patient undergo fistulogram in order to assist with maturation.  We will also reach out to the patient's nephrologist in order to determine if he will need a PermCath while he is having his procedure done  2. Hyperlipidemia, unspecified hyperlipidemia type Continue statin as ordered and reviewed, no changes at this time  3. Primary hypertension Continue antihypertensive medications as already ordered, these medications have been reviewed and there are no changes at this time.   Current Outpatient Medications on File Prior to Visit  Medication Sig Dispense Refill  . acetaminophen (TYLENOL) 500 MG tablet Take 1,000 mg by mouth every 6 (six) hours as needed (shoulder pain).    Marland Kitchen albuterol (PROVENTIL HFA;VENTOLIN HFA) 108 (90 Base) MCG/ACT inhaler Inhale 2 puffs into the lungs every 6 (  six) hours as needed for wheezing or shortness of breath.    Marland Kitchen amLODipine (NORVASC) 5 MG tablet Take 1 tablet (5 mg total) by mouth daily. 30 tablet 0  . aspirin EC 81 MG tablet Take by mouth.    . calcitRIOL (ROCALTROL) 0.25 MCG capsule Take 0.25 mcg by mouth daily.    . cetirizine (ZYRTEC) 10 MG chewable tablet Chew 10 mg by mouth daily as needed for allergies.    . Continuous Blood Gluc Sensor (FREESTYLE LIBRE 14 DAY SENSOR) MISC by Does not apply route.    . fluticasone (FLONASE) 50 MCG/ACT nasal spray as needed.    . furosemide (LASIX) 40 MG tablet Take 80 mg q am and 40 mg qpm 60 tablet 2  . GLOBAL INJECT EASE INSULIN SYR 31G X 5/16" 0.5 ML MISC Inject into the skin.    Marland Kitchen glucose blood (ONETOUCH ULTRA) test strip TEST BLOOD SUGAR 4 TIMES DAILY    . insulin aspart (NOVOLOG) 100 UNIT/ML  injection Inject 14 Units into the skin 3 (three) times daily with meals. (Patient taking differently: Inject 45 Units into the skin 3 (three) times daily with meals. Pt states he takes 45units in am 30 units at lunch and 45 units at pm) 10 mL 11  . insulin detemir (LEVEMIR) 100 UNIT/ML injection Inject 0.5 mLs (50 Units total) into the skin at bedtime. 10 mL 1  . isosorbide mononitrate (IMDUR) 30 MG 24 hr tablet Take 1 tablet (30 mg total) by mouth daily. 30 tablet 2  . Lancets (ONETOUCH DELICA PLUS LANCET30G) MISC USE 1 LANCET 4 TIMES DAILY    . lovastatin (MEVACOR) 40 MG tablet Take 40 mg by mouth daily with supper.    . metoprolol succinate (TOPROL-XL) 50 MG 24 hr tablet Take 50 mg by mouth daily.    . nitroGLYCERIN (NITROSTAT) 0.4 MG SL tablet Place 1 tablet (0.4 mg total) under the tongue every 5 (five) minutes as needed for chest pain. 20 tablet 1  . potassium citrate (UROCIT-K) 10 MEQ (1080 MG) SR tablet TAKE (1) TABLET BY MOUTH TWICE DAILY WITH A MEAL. 60 tablet 11  . tamsulosin (FLOMAX) 0.4 MG CAPS capsule TAKE (1) CAPSULE BY MOUTH EVERY DAY 30 capsule 1   No current facility-administered medications on file prior to visit.    There are no Patient Instructions on file for this visit. No follow-ups on file.   Georgiana Spinner, NP

## 2022-12-23 ENCOUNTER — Ambulatory Visit: Payer: PPO | Attending: Family | Admitting: Family

## 2022-12-23 ENCOUNTER — Encounter: Payer: Self-pay | Admitting: Family

## 2022-12-23 VITALS — BP 139/58 | HR 73 | Ht 70.0 in | Wt 310.0 lb

## 2022-12-23 DIAGNOSIS — J45909 Unspecified asthma, uncomplicated: Secondary | ICD-10-CM | POA: Diagnosis not present

## 2022-12-23 DIAGNOSIS — E785 Hyperlipidemia, unspecified: Secondary | ICD-10-CM | POA: Diagnosis not present

## 2022-12-23 DIAGNOSIS — I2510 Atherosclerotic heart disease of native coronary artery without angina pectoris: Secondary | ICD-10-CM | POA: Diagnosis not present

## 2022-12-23 DIAGNOSIS — I130 Hypertensive heart and chronic kidney disease with heart failure and stage 1 through stage 4 chronic kidney disease, or unspecified chronic kidney disease: Secondary | ICD-10-CM | POA: Insufficient documentation

## 2022-12-23 DIAGNOSIS — I5040 Unspecified combined systolic (congestive) and diastolic (congestive) heart failure: Secondary | ICD-10-CM | POA: Diagnosis not present

## 2022-12-23 DIAGNOSIS — E1122 Type 2 diabetes mellitus with diabetic chronic kidney disease: Secondary | ICD-10-CM | POA: Diagnosis not present

## 2022-12-23 DIAGNOSIS — I5022 Chronic systolic (congestive) heart failure: Secondary | ICD-10-CM

## 2022-12-23 DIAGNOSIS — Z794 Long term (current) use of insulin: Secondary | ICD-10-CM | POA: Diagnosis not present

## 2022-12-23 DIAGNOSIS — Z79899 Other long term (current) drug therapy: Secondary | ICD-10-CM | POA: Diagnosis not present

## 2022-12-23 DIAGNOSIS — N184 Chronic kidney disease, stage 4 (severe): Secondary | ICD-10-CM

## 2022-12-23 DIAGNOSIS — I10 Essential (primary) hypertension: Secondary | ICD-10-CM | POA: Diagnosis not present

## 2022-12-23 NOTE — Progress Notes (Signed)
PCP: Maudie Flakes, MD (last seen 04/24) Primary Cardiologist: Marcina Millard, MD (last seen 04/24)  HPI:  Mr Leinberger is a 82 y/o male with a history of asthma, DM, hyperlipidemia, HTN, CKD, bell's palsy, sleep apnea and chronic heart failure.   Echo 12/17/22: EF 50-55% along with mild LAE. Echo 07/09/22: EF of 45% along with moderate LVH. Echo 02/26/22: EF of 55-60% along with mild LVH, LAE and trivial MR.   LHC 12/31/21:    Mid LAD lesion is 60% stenosed.   Prox RCA lesion is 20% stenosed.   Dist RCA lesion is 20% stenosed.   Ost Cx to Prox Cx lesion is 20% stenosed.   The left ventricular systolic function is normal.   LV end diastolic pressure is moderately elevated.   The left ventricular ejection fraction is greater than 65% by visual estimate.  Patient has significant elevated end-diastolic pressure  Admitted 12/13/22 due to due to increased shortness of breath and chest pain. 2 weeks ago, he had a rapid worsening in his dyspnea on exertion and shortness of breath at rest. He has not had any increased urine output since increasing his diuretic regimen as instructed by his nephrologist. Patient was seen on 12/09/2022 by nephrology at which time he was started on IV Lasix 80 mg twice a week with oral Lasix on the rest of the days. Placed on lasix drip with transition to oral diuretics. Admitted 06/12/22 due to worsening shortness of breath. Patient states that he has had shortness of breath for several days but worse 1 day prior to his admission. He states that he slept in his recliner with his CPAP because he could not breathe laying flat. Cardiology & nephrology consults obtained. Diuretic changed. PT/OT evaluations done. Discharged after 8 days.   He presents today for a HF follow-up visit with a chief complaint of moderate SOB with little exertion. Chronic in nature. Has associated fatigue, leg weakness, chest tightness with SOB, edema and dizziness along with this. Denies chest pain,  abdominal distention or difficulty sleeping. Has upcoming appointment with pulmonology as well as cardiology later this week.   Is receiving PT at home. His current AV fistula in his left forearm isn't "sticking up" as much as vascular/ nephrology would like so may have a balloon procedure on it in the future.   ROS: All systems negative except as listed in HPI, PMH and Problem List.  SH:  Social History   Socioeconomic History   Marital status: Married    Spouse name: Not on file   Number of children: Not on file   Years of education: Not on file   Highest education level: Not on file  Occupational History   Not on file  Tobacco Use   Smoking status: Never    Passive exposure: Never   Smokeless tobacco: Never  Vaping Use   Vaping Use: Never used  Substance and Sexual Activity   Alcohol use: No   Drug use: Not Currently   Sexual activity: Yes    Birth control/protection: None  Other Topics Concern   Not on file  Social History Narrative   Not on file   Social Determinants of Health   Financial Resource Strain: Not on file  Food Insecurity: No Food Insecurity (12/13/2022)   Hunger Vital Sign    Worried About Running Out of Food in the Last Year: Never true    Ran Out of Food in the Last Year: Never true  Transportation Needs: No Transportation Needs (  12/13/2022)   PRAPARE - Administrator, Civil Service (Medical): No    Lack of Transportation (Non-Medical): No  Physical Activity: Not on file  Stress: Not on file  Social Connections: Not on file  Intimate Partner Violence: Not At Risk (12/13/2022)   Humiliation, Afraid, Rape, and Kick questionnaire    Fear of Current or Ex-Partner: No    Emotionally Abused: No    Physically Abused: No    Sexually Abused: No    FH:  Family History  Problem Relation Age of Onset   Emphysema Mother    COPD Mother    Heart disease Mother    Brain cancer Father     Past Medical History:  Diagnosis Date   (HFpEF)  heart failure with preserved ejection fraction (HCC)    a.) TTE 11/26/2021: EF 45%, mod LVH, post HK, mod LAE, mild MR/TR, G2DD; b.) TTE 02/26/2022: EF 55-60%, BAE, triv MR, G2DD; c.) TTE 07/09/2022: EF 45%, post HK, LVH, mod LAE, triv TR/PR, mild MR, G1DD   Anemia of chronic renal failure    Aortic atherosclerosis (HCC)    Asthma    Bell's palsy    BPH (benign prostatic hyperplasia)    CAD (coronary artery disease)    a.) LHC 12/31/2021: 60% mLAD, 20% pRCA, 20% dRCA, 20% o-pLCx --> med mgmt.   CKD (chronic kidney disease), stage IV (HCC)    Diabetic neuropathy (HCC)    Dyspnea on exertion    Ganglion cyst of wrist, right    a.) s/p excision 04/2011   Hepatic steatosis    History of 2019 novel coronavirus disease (COVID-19)    History of bilateral cataract extraction 2018   Hyperlipidemia    Hyperparathyroidism due to renal insufficiency (HCC)    Hypertension    Low testosterone in male    Nephrolithiasis    Obesity    OSA treated with BiPAP    Osteoarthritis    Peripheral edema    Right inguinal hernia    a.) s/p repair   Skin cancer    Type 2 diabetes mellitus with renal manifestations (HCC)     Current Outpatient Medications  Medication Sig Dispense Refill   acetaminophen (TYLENOL) 500 MG tablet Take 1,000 mg by mouth every 6 (six) hours as needed (shoulder pain).     albuterol (PROVENTIL HFA;VENTOLIN HFA) 108 (90 Base) MCG/ACT inhaler Inhale 2 puffs into the lungs every 6 (six) hours as needed for wheezing or shortness of breath.     amLODipine (NORVASC) 5 MG tablet Take 1 tablet (5 mg total) by mouth daily. 30 tablet 0   aspirin EC 81 MG tablet Take by mouth.     calcitRIOL (ROCALTROL) 0.25 MCG capsule Take 0.25 mcg by mouth daily.     cetirizine (ZYRTEC) 10 MG chewable tablet Chew 10 mg by mouth daily as needed for allergies.     Continuous Blood Gluc Sensor (FREESTYLE LIBRE 14 DAY SENSOR) MISC by Does not apply route.     fluticasone (FLONASE) 50 MCG/ACT nasal spray as  needed.     furosemide (LASIX) 40 MG tablet Take 80 mg q am and 40 mg qpm 60 tablet 2   GLOBAL INJECT EASE INSULIN SYR 31G X 5/16" 0.5 ML MISC Inject into the skin.     glucose blood (ONETOUCH ULTRA) test strip TEST BLOOD SUGAR 4 TIMES DAILY     insulin aspart (NOVOLOG) 100 UNIT/ML injection Inject 14 Units into the skin 3 (three) times daily with  meals. (Patient taking differently: Inject 45 Units into the skin 3 (three) times daily with meals. Pt states he takes 45units in am 30 units at lunch and 45 units at pm) 10 mL 11   insulin detemir (LEVEMIR) 100 UNIT/ML injection Inject 0.5 mLs (50 Units total) into the skin at bedtime. 10 mL 1   isosorbide mononitrate (IMDUR) 30 MG 24 hr tablet Take 1 tablet (30 mg total) by mouth daily. 30 tablet 2   Lancets (ONETOUCH DELICA PLUS LANCET30G) MISC USE 1 LANCET 4 TIMES DAILY     lovastatin (MEVACOR) 40 MG tablet Take 40 mg by mouth daily with supper.     metoprolol succinate (TOPROL-XL) 50 MG 24 hr tablet Take 50 mg by mouth daily.     nitroGLYCERIN (NITROSTAT) 0.4 MG SL tablet Place 1 tablet (0.4 mg total) under the tongue every 5 (five) minutes as needed for chest pain. 20 tablet 1   potassium citrate (UROCIT-K) 10 MEQ (1080 MG) SR tablet TAKE (1) TABLET BY MOUTH TWICE DAILY WITH A MEAL. 60 tablet 11   tamsulosin (FLOMAX) 0.4 MG CAPS capsule TAKE (1) CAPSULE BY MOUTH EVERY DAY 30 capsule 1   No current facility-administered medications for this visit.   Vitals:   12/23/22 1525  BP: (!) 139/58  Pulse: 73  SpO2: 100%  Weight: (!) 310 lb (140.6 kg)  Height: 5\' 10"  (1.778 m)   Wt Readings from Last 3 Encounters:  12/23/22 (!) 310 lb (140.6 kg)  12/19/22 (!) 307 lb 6.4 oz (139.4 kg)  12/17/22 298 lb 14.4 oz (135.6 kg)   Lab Results  Component Value Date   CREATININE 3.57 (H) 12/17/2022   CREATININE 3.71 (H) 12/16/2022   CREATININE 3.85 (H) 12/15/2022   PHYSICAL EXAM:  General:  Well appearing. No resp difficulty HEENT: normal Neck:  supple. JVP flat. No lymphadenopathy or thryomegaly appreciated. Cor: PMI normal. Regular rate & rhythm. No rubs, gallops or murmurs. Lungs: clear Abdomen: soft, nontender, nondistended. No hepatosplenomegaly. No bruits or masses.  Extremities: no cyanosis, clubbing, rash, 1+ pitting edema bilateral lower legs Neuro: alert & oriented x3, cranial nerves grossly intact. Moves all 4 extremities w/o difficulty. Affect pleasant. Palpable thrill over left FA fistula   ECG: not done   ASSESSMENT & PLAN:  1: Chronic heart failure with mildly reduced ejection fraction- - NYHA class III - euvolemic today - weighing daily & home weight ranges from 302-305 pounds; reminded to call for an overnight weight gain of > 2 pounds or a weekly weight gain of >5 pounds - weight down 4 pounds from last visit here 3.5 months ago - Echo 12/17/22: EF 50-55% along with mild LAE.  - Echo 07/09/22: EF of 45% along with moderate LVH.  - Echo 02/26/22: EF of 55-60% along with mild LVH, LAE and trivial MR.  - LHC 12/31/21:    Mid LAD lesion is 60% stenosed.   Prox RCA lesion is 20% stenosed.   Dist RCA lesion is 20% stenosed.   Ost Cx to Prox Cx lesion is 20% stenosed.   The left ventricular systolic function is normal.   LV end diastolic pressure is moderately elevated.   The left ventricular ejection fraction is greater than 65% by visual estimate.  Patient has significant elevated end-diastolic pressure - continue furosemide 80mg  AM/ 40mg  PM - continue metoprolol succinate 50mg  daily - renal function limits other GDMT therapy - not adding salt and wife reads food labels for sodium content and understands to keep  daily intake to 2000mg  - drinking 36 ounces of water, 1 glass of milk and a diet sprite daily - saw cardiology Madaline Guthrie) 04/24 - wearing compression socks daily with removal at bedtime - BNP 12/13/22 was 415.8   2: HTN- - BP 139/58 - continue amlodipine 5mg  QD - saw PCP (Feldpausch) 04/24 - BMP  12/17/22 reviewed and showed sodium 143, potassium 4.5, creatinine 3.57 & GFR 16  3: DM with CKD- - A1c 10/01/22 was 8.3% - saw nephrology Cherylann Ratel) 05/24 - saw endocrinology (Solum) 12/23 - saw vascular Manson Passey) 05/24 - + palpable thrill over left FA fistula site  Return in 2 months, sooner if needed.

## 2022-12-24 DIAGNOSIS — E1122 Type 2 diabetes mellitus with diabetic chronic kidney disease: Secondary | ICD-10-CM | POA: Diagnosis not present

## 2022-12-24 DIAGNOSIS — N184 Chronic kidney disease, stage 4 (severe): Secondary | ICD-10-CM | POA: Diagnosis not present

## 2022-12-25 DIAGNOSIS — E7800 Pure hypercholesterolemia, unspecified: Secondary | ICD-10-CM | POA: Diagnosis not present

## 2022-12-25 DIAGNOSIS — I5032 Chronic diastolic (congestive) heart failure: Secondary | ICD-10-CM | POA: Diagnosis not present

## 2022-12-25 DIAGNOSIS — J811 Chronic pulmonary edema: Secondary | ICD-10-CM | POA: Diagnosis not present

## 2022-12-25 DIAGNOSIS — I700 Atherosclerosis of aorta: Secondary | ICD-10-CM | POA: Diagnosis not present

## 2022-12-25 DIAGNOSIS — I10 Essential (primary) hypertension: Secondary | ICD-10-CM | POA: Diagnosis not present

## 2022-12-25 DIAGNOSIS — R600 Localized edema: Secondary | ICD-10-CM | POA: Diagnosis not present

## 2022-12-25 DIAGNOSIS — E1142 Type 2 diabetes mellitus with diabetic polyneuropathy: Secondary | ICD-10-CM | POA: Diagnosis not present

## 2022-12-25 DIAGNOSIS — R0602 Shortness of breath: Secondary | ICD-10-CM | POA: Diagnosis not present

## 2022-12-25 DIAGNOSIS — I2510 Atherosclerotic heart disease of native coronary artery without angina pectoris: Secondary | ICD-10-CM | POA: Diagnosis not present

## 2022-12-25 DIAGNOSIS — G4733 Obstructive sleep apnea (adult) (pediatric): Secondary | ICD-10-CM | POA: Diagnosis not present

## 2022-12-26 DIAGNOSIS — D631 Anemia in chronic kidney disease: Secondary | ICD-10-CM | POA: Diagnosis not present

## 2022-12-26 DIAGNOSIS — E1122 Type 2 diabetes mellitus with diabetic chronic kidney disease: Secondary | ICD-10-CM | POA: Diagnosis not present

## 2022-12-26 DIAGNOSIS — R809 Proteinuria, unspecified: Secondary | ICD-10-CM | POA: Diagnosis not present

## 2022-12-26 DIAGNOSIS — R600 Localized edema: Secondary | ICD-10-CM | POA: Diagnosis not present

## 2022-12-26 DIAGNOSIS — N2581 Secondary hyperparathyroidism of renal origin: Secondary | ICD-10-CM | POA: Diagnosis not present

## 2022-12-26 DIAGNOSIS — I10 Essential (primary) hypertension: Secondary | ICD-10-CM | POA: Diagnosis not present

## 2022-12-26 DIAGNOSIS — N184 Chronic kidney disease, stage 4 (severe): Secondary | ICD-10-CM | POA: Diagnosis not present

## 2022-12-27 DIAGNOSIS — E1122 Type 2 diabetes mellitus with diabetic chronic kidney disease: Secondary | ICD-10-CM | POA: Diagnosis not present

## 2022-12-27 DIAGNOSIS — Z794 Long term (current) use of insulin: Secondary | ICD-10-CM | POA: Diagnosis not present

## 2022-12-27 DIAGNOSIS — N184 Chronic kidney disease, stage 4 (severe): Secondary | ICD-10-CM | POA: Diagnosis not present

## 2022-12-27 DIAGNOSIS — I10 Essential (primary) hypertension: Secondary | ICD-10-CM | POA: Diagnosis not present

## 2022-12-27 DIAGNOSIS — R531 Weakness: Secondary | ICD-10-CM | POA: Diagnosis not present

## 2022-12-27 DIAGNOSIS — R6889 Other general symptoms and signs: Secondary | ICD-10-CM | POA: Diagnosis not present

## 2022-12-27 DIAGNOSIS — I509 Heart failure, unspecified: Secondary | ICD-10-CM | POA: Diagnosis not present

## 2022-12-30 DIAGNOSIS — E1121 Type 2 diabetes mellitus with diabetic nephropathy: Secondary | ICD-10-CM | POA: Diagnosis not present

## 2022-12-30 DIAGNOSIS — Z794 Long term (current) use of insulin: Secondary | ICD-10-CM | POA: Diagnosis not present

## 2022-12-30 DIAGNOSIS — E1122 Type 2 diabetes mellitus with diabetic chronic kidney disease: Secondary | ICD-10-CM | POA: Diagnosis not present

## 2022-12-30 DIAGNOSIS — N184 Chronic kidney disease, stage 4 (severe): Secondary | ICD-10-CM | POA: Diagnosis not present

## 2022-12-30 DIAGNOSIS — I10 Essential (primary) hypertension: Secondary | ICD-10-CM | POA: Diagnosis not present

## 2022-12-30 DIAGNOSIS — E1142 Type 2 diabetes mellitus with diabetic polyneuropathy: Secondary | ICD-10-CM | POA: Diagnosis not present

## 2023-01-03 ENCOUNTER — Telehealth (INDEPENDENT_AMBULATORY_CARE_PROVIDER_SITE_OTHER): Payer: Self-pay

## 2023-01-03 NOTE — Telephone Encounter (Signed)
Spoke with the patient and spouse and the patient is scheduled on 01/13/23 with a  6:45 am arrival time to the [facility] for a left arm fistulagram. Pre-procedure instructions were discussed and will be sent to Mychart and mailed.

## 2023-01-07 DIAGNOSIS — J9611 Chronic respiratory failure with hypoxia: Secondary | ICD-10-CM | POA: Diagnosis not present

## 2023-01-09 DIAGNOSIS — E1121 Type 2 diabetes mellitus with diabetic nephropathy: Secondary | ICD-10-CM | POA: Diagnosis not present

## 2023-01-13 ENCOUNTER — Ambulatory Visit
Admission: RE | Admit: 2023-01-13 | Discharge: 2023-01-13 | Disposition: A | Discharge status: Home / Self Care | Payer: PPO | Attending: Vascular Surgery | Admitting: Vascular Surgery

## 2023-01-13 ENCOUNTER — Other Ambulatory Visit: Payer: Self-pay

## 2023-01-13 ENCOUNTER — Encounter
Admission: RE | Disposition: A | Discharge status: Home / Self Care | Payer: Self-pay | Source: Home / Self Care | Attending: Vascular Surgery

## 2023-01-13 ENCOUNTER — Encounter: Payer: Self-pay | Admitting: Vascular Surgery

## 2023-01-13 DIAGNOSIS — T82858A Stenosis of vascular prosthetic devices, implants and grafts, initial encounter: Secondary | ICD-10-CM

## 2023-01-13 DIAGNOSIS — I132 Hypertensive heart and chronic kidney disease with heart failure and with stage 5 chronic kidney disease, or end stage renal disease: Secondary | ICD-10-CM | POA: Insufficient documentation

## 2023-01-13 DIAGNOSIS — Z992 Dependence on renal dialysis: Secondary | ICD-10-CM

## 2023-01-13 DIAGNOSIS — Z794 Long term (current) use of insulin: Secondary | ICD-10-CM | POA: Insufficient documentation

## 2023-01-13 DIAGNOSIS — N186 End stage renal disease: Secondary | ICD-10-CM

## 2023-01-13 DIAGNOSIS — Y841 Kidney dialysis as the cause of abnormal reaction of the patient, or of later complication, without mention of misadventure at the time of the procedure: Secondary | ICD-10-CM | POA: Diagnosis not present

## 2023-01-13 DIAGNOSIS — E1122 Type 2 diabetes mellitus with diabetic chronic kidney disease: Secondary | ICD-10-CM | POA: Diagnosis not present

## 2023-01-13 DIAGNOSIS — I5032 Chronic diastolic (congestive) heart failure: Secondary | ICD-10-CM | POA: Diagnosis not present

## 2023-01-13 HISTORY — PX: A/V FISTULAGRAM: CATH118298

## 2023-01-13 LAB — CREATININE, SERUM
Creatinine, Ser: 3.24 mg/dL — ABNORMAL HIGH (ref 0.61–1.24)
GFR, Estimated: 18 mL/min — ABNORMAL LOW (ref >60)

## 2023-01-13 LAB — GLUCOSE, CAPILLARY
Glucose-Capillary: 166 mg/dL — ABNORMAL HIGH (ref 70–99)
Glucose-Capillary: 188 mg/dL — ABNORMAL HIGH (ref 70–99)

## 2023-01-13 LAB — BUN: BUN: 65 mg/dL — ABNORMAL HIGH (ref 8–23)

## 2023-01-13 SURGERY — A/V FISTULAGRAM
Anesthesia: Moderate Sedation | Laterality: Left

## 2023-01-13 MED ORDER — HEPARIN SODIUM (PORCINE) 1000 UNIT/ML IJ SOLN
INTRAMUSCULAR | Status: AC
  Filled 2023-01-13: qty 10

## 2023-01-13 MED ORDER — MIDAZOLAM HCL 2 MG/ML PO SYRP: 8.0000 mg | ORAL_SOLUTION | Freq: Once | ORAL | Status: DC | PRN

## 2023-01-13 MED ORDER — MIDAZOLAM HCL 2 MG/2ML IJ SOLN
INTRAMUSCULAR | Status: AC
  Filled 2023-01-13: qty 2

## 2023-01-13 MED ORDER — DIPHENHYDRAMINE HCL 50 MG/ML IJ SOLN: 50.0000 mg | Freq: Once | INTRAMUSCULAR | Status: DC | PRN

## 2023-01-13 MED ORDER — HYDROMORPHONE HCL 1 MG/ML IJ SOLN: 1.0000 mg | Freq: Once | INTRAMUSCULAR | Status: DC | PRN

## 2023-01-13 MED ORDER — CEFAZOLIN SODIUM-DEXTROSE 1-4 GM/50ML-% IV SOLN
1.0000 g | INTRAVENOUS | Status: AC
  Administered 2023-01-13: 1 g via INTRAVENOUS

## 2023-01-13 MED ORDER — MIDAZOLAM HCL 2 MG/2ML IJ SOLN
INTRAMUSCULAR | Status: DC | PRN
  Administered 2023-01-13 (×2): 1 mg via INTRAVENOUS

## 2023-01-13 MED ORDER — FENTANYL CITRATE (PF) 100 MCG/2ML IJ SOLN
INTRAMUSCULAR | Status: DC | PRN
  Administered 2023-01-13 (×2): 25 ug via INTRAVENOUS

## 2023-01-13 MED ORDER — SODIUM CHLORIDE 0.9 % IV SOLN: INTRAVENOUS | Status: DC

## 2023-01-13 MED ORDER — FAMOTIDINE 20 MG PO TABS: 40.0000 mg | ORAL_TABLET | Freq: Once | ORAL | Status: DC | PRN

## 2023-01-13 MED ORDER — FENTANYL CITRATE PF 50 MCG/ML IJ SOSY
PREFILLED_SYRINGE | INTRAMUSCULAR | Status: AC
  Filled 2023-01-13: qty 1

## 2023-01-13 MED ORDER — METHYLPREDNISOLONE SODIUM SUCC 125 MG IJ SOLR: 125.0000 mg | Freq: Once | INTRAMUSCULAR | Status: DC | PRN

## 2023-01-13 MED ORDER — ONDANSETRON HCL 4 MG/2ML IJ SOLN: 4.0000 mg | Freq: Four times a day (QID) | INTRAMUSCULAR | Status: DC | PRN

## 2023-01-13 MED ORDER — HEPARIN SODIUM (PORCINE) 1000 UNIT/ML IJ SOLN
INTRAMUSCULAR | Status: DC | PRN
  Administered 2023-01-13: 3000 [IU] via INTRAVENOUS

## 2023-01-13 SURGICAL SUPPLY — 8 items
BALLN LUTONIX DCB 5X60X130 (BALLOONS) ×1
BALLOON LUTONIX DCB 5X60X130 (BALLOONS) IMPLANT
CANNULA 5F STIFF (CANNULA) IMPLANT
GLIDEWIRE ADV .035X180CM (WIRE) IMPLANT
KIT ENCORE 26 ADVANTAGE (KITS) IMPLANT
PACK ANGIOGRAPHY (CUSTOM PROCEDURE TRAY) ×1 IMPLANT
SHEATH BRITE TIP 6FRX5.5 (SHEATH) IMPLANT
SUT MNCRL AB 4-0 PS2 18 (SUTURE) IMPLANT

## 2023-01-13 NOTE — Interval H&P Note (Signed)
History and Physical Interval Note:  01/13/2023 7:51 AM  James Clarke  has presented today for surgery, with the diagnosis of L arm fistulagram   End Stage Renal.  The various methods of treatment have been discussed with the patient and family. After consideration of risks, benefits and other options for treatment, the patient has consented to  Procedure(s): A/V Fistulagram (Left) as a surgical intervention.  The patient's history has been reviewed, patient examined, no change in status, stable for surgery.  I have reviewed the patient's chart and labs.  Questions were answered to the patient's satisfaction.     Festus Barren

## 2023-01-13 NOTE — Op Note (Signed)
[facility]    OPERATIVE NOTE   PROCEDURE: 1.   Left radiocephalic arteriovenous fistula cannulation under ultrasound guidance 2.   Left arm fistulagram  3.   Percutaneous transluminal angioplasty of the distal forearm cephalic vein with 5 mm diameter by 6 cm in length Lutonix drug-coated angioplasty balloon  PRE-OPERATIVE DIAGNOSIS: 1. ESRD 2. Poorly functional left radiocephalic AVF  POST-OPERATIVE DIAGNOSIS: same as above   SURGEON: Festus Barren, MD  ANESTHESIA: local with MCS  ESTIMATED BLOOD LOSS: 5 cc  FINDING(S): Stenosis of the distal forearm cephalic vein 2 to 3 cm beyond the anastomosis in the 70 to 80% range.  The remainder of the forearm cephalic vein was fairly normal.  The anastomosis itself was fairly normal.  There was dual outflow through both the basilic and cephalic veins in the upper arm and no other obvious stenosis   SPECIMEN(S):  None  CONTRAST: 35 cc  FLUORO TIME: 1.8 minutes  MODERATE CONSCIOUS SEDATION TIME: Approximately 21 minutes with 2 mg of Versed and 50 mcg of Fentanyl   INDICATIONS: James Clarke is a 82 y.o. male who presents with malfunctioning left radiocephalic arteriovenous fistula.  The patient is scheduled for left arm fistulagram.  The patient is aware the risks include but are not limited to: bleeding, infection, thrombosis of the cannulated access, and possible anaphylactic reaction to the contrast.  The patient is aware of the risks of the procedure and elects to proceed forward.  DESCRIPTION: After full informed written consent was obtained, the patient was brought back to the angiography suite and placed supine upon the angiography table.  The patient was connected to monitoring equipment. Moderate conscious sedation was administered with a face to face encounter with the patient throughout the procedure with my supervision of the RN administering medicines and monitoring the patient's vital signs and mental  status throughout from the start of the procedure until the patient was taken to the recovery room. The left arm was prepped and draped in the standard fashion for a percutaneous access intervention.  Under ultrasound guidance, the left radiocephalic arteriovenous fistula was cannulated with a micropuncture needle under direct ultrasound guidance where it was patent and a permanent image was performed.  The microwire was advanced into the fistula and the needle was exchanged for the a microsheath.  I then upsized to a 6 Fr Sheath.  I then used an advantage wire and Kumpe catheter to advance to the anastomosis and just into the radial artery and imaging was performed.  Hand injections were completed to image the access including the upper arm to the chest. This demonstrated stenosis of the distal forearm cephalic vein 2 to 3 cm beyond the anastomosis in the 70 to 80% range.  The remainder of the forearm cephalic vein was fairly normal.  The anastomosis itself was fairly normal.  There was dual outflow through both the basilic and cephalic veins in the upper arm and no other obvious stenosis.  Based on the images, this patient will need intervention to the distal forearm cephalic vein stenosis. I then gave the patient 3000 units of intravenous heparin.  I then crossed the stenosis with an Advantage wire.  Based on the imaging, a 5 mm x 6 cm  Lutonix drug coated angioplasty balloon was selected.  The balloon was centered around the distal forearm cephalic vein stenosis and inflated to 10 ATM for 1 minute(s).  On completion imaging, a 15-20% residual stenosis was present.  Based on the completion imaging, no further intervention is necessary.  The wire and balloon were removed from the sheath.  A 4-0 Monocryl purse-string suture was sewn around the sheath.  The sheath was removed while tying down the suture.  A sterile bandage was applied to the puncture site.  COMPLICATIONS: None  CONDITION:  Stable   Festus Barren  01/13/2023 9:21 AM   This note was created with Dragon Medical transcription system. Any errors in dictation are purely unintentional.

## 2023-01-14 DIAGNOSIS — E1142 Type 2 diabetes mellitus with diabetic polyneuropathy: Secondary | ICD-10-CM | POA: Diagnosis not present

## 2023-01-16 DIAGNOSIS — N184 Chronic kidney disease, stage 4 (severe): Secondary | ICD-10-CM | POA: Diagnosis not present

## 2023-01-16 DIAGNOSIS — E1122 Type 2 diabetes mellitus with diabetic chronic kidney disease: Secondary | ICD-10-CM | POA: Diagnosis not present

## 2023-01-17 DIAGNOSIS — E1122 Type 2 diabetes mellitus with diabetic chronic kidney disease: Secondary | ICD-10-CM | POA: Diagnosis not present

## 2023-01-17 DIAGNOSIS — I10 Essential (primary) hypertension: Secondary | ICD-10-CM | POA: Diagnosis not present

## 2023-01-17 DIAGNOSIS — E1159 Type 2 diabetes mellitus with other circulatory complications: Secondary | ICD-10-CM | POA: Diagnosis not present

## 2023-01-17 DIAGNOSIS — E1165 Type 2 diabetes mellitus with hyperglycemia: Secondary | ICD-10-CM | POA: Diagnosis not present

## 2023-01-17 DIAGNOSIS — Z515 Encounter for palliative care: Secondary | ICD-10-CM | POA: Diagnosis not present

## 2023-01-17 DIAGNOSIS — Z6841 Body Mass Index (BMI) 40.0 and over, adult: Secondary | ICD-10-CM | POA: Diagnosis not present

## 2023-01-17 DIAGNOSIS — N185 Chronic kidney disease, stage 5: Secondary | ICD-10-CM | POA: Diagnosis not present

## 2023-01-17 DIAGNOSIS — I5030 Unspecified diastolic (congestive) heart failure: Secondary | ICD-10-CM | POA: Diagnosis not present

## 2023-01-17 DIAGNOSIS — Z794 Long term (current) use of insulin: Secondary | ICD-10-CM | POA: Diagnosis not present

## 2023-01-19 DIAGNOSIS — G4733 Obstructive sleep apnea (adult) (pediatric): Secondary | ICD-10-CM | POA: Diagnosis not present

## 2023-01-21 DIAGNOSIS — E1142 Type 2 diabetes mellitus with diabetic polyneuropathy: Secondary | ICD-10-CM | POA: Diagnosis not present

## 2023-01-21 DIAGNOSIS — Z794 Long term (current) use of insulin: Secondary | ICD-10-CM | POA: Diagnosis not present

## 2023-01-21 DIAGNOSIS — E1122 Type 2 diabetes mellitus with diabetic chronic kidney disease: Secondary | ICD-10-CM | POA: Diagnosis not present

## 2023-01-21 DIAGNOSIS — N184 Chronic kidney disease, stage 4 (severe): Secondary | ICD-10-CM | POA: Diagnosis not present

## 2023-01-21 DIAGNOSIS — E1121 Type 2 diabetes mellitus with diabetic nephropathy: Secondary | ICD-10-CM | POA: Diagnosis not present

## 2023-01-21 DIAGNOSIS — I10 Essential (primary) hypertension: Secondary | ICD-10-CM | POA: Diagnosis not present

## 2023-01-21 DIAGNOSIS — E1165 Type 2 diabetes mellitus with hyperglycemia: Secondary | ICD-10-CM | POA: Diagnosis not present

## 2023-01-22 DIAGNOSIS — R809 Proteinuria, unspecified: Secondary | ICD-10-CM | POA: Diagnosis not present

## 2023-01-22 DIAGNOSIS — D631 Anemia in chronic kidney disease: Secondary | ICD-10-CM | POA: Diagnosis not present

## 2023-01-22 DIAGNOSIS — I10 Essential (primary) hypertension: Secondary | ICD-10-CM | POA: Diagnosis not present

## 2023-01-22 DIAGNOSIS — E1122 Type 2 diabetes mellitus with diabetic chronic kidney disease: Secondary | ICD-10-CM | POA: Diagnosis not present

## 2023-01-22 DIAGNOSIS — R600 Localized edema: Secondary | ICD-10-CM | POA: Diagnosis not present

## 2023-01-22 DIAGNOSIS — N2581 Secondary hyperparathyroidism of renal origin: Secondary | ICD-10-CM | POA: Diagnosis not present

## 2023-01-22 DIAGNOSIS — N184 Chronic kidney disease, stage 4 (severe): Secondary | ICD-10-CM | POA: Diagnosis not present

## 2023-01-27 ENCOUNTER — Other Ambulatory Visit (INDEPENDENT_AMBULATORY_CARE_PROVIDER_SITE_OTHER): Payer: Self-pay | Admitting: Vascular Surgery

## 2023-01-27 DIAGNOSIS — N186 End stage renal disease: Secondary | ICD-10-CM

## 2023-02-05 ENCOUNTER — Encounter (INDEPENDENT_AMBULATORY_CARE_PROVIDER_SITE_OTHER): Payer: Self-pay | Admitting: Nurse Practitioner

## 2023-02-05 ENCOUNTER — Ambulatory Visit (INDEPENDENT_AMBULATORY_CARE_PROVIDER_SITE_OTHER): Payer: PPO

## 2023-02-05 ENCOUNTER — Ambulatory Visit (INDEPENDENT_AMBULATORY_CARE_PROVIDER_SITE_OTHER): Payer: PPO | Admitting: Nurse Practitioner

## 2023-02-05 VITALS — BP 147/72 | HR 67 | Resp 16 | Wt 313.0 lb

## 2023-02-05 DIAGNOSIS — I10 Essential (primary) hypertension: Secondary | ICD-10-CM | POA: Diagnosis not present

## 2023-02-05 DIAGNOSIS — E785 Hyperlipidemia, unspecified: Secondary | ICD-10-CM | POA: Diagnosis not present

## 2023-02-05 DIAGNOSIS — N184 Chronic kidney disease, stage 4 (severe): Secondary | ICD-10-CM | POA: Diagnosis not present

## 2023-02-05 DIAGNOSIS — N186 End stage renal disease: Secondary | ICD-10-CM | POA: Diagnosis not present

## 2023-02-05 NOTE — Progress Notes (Signed)
Subjective:    Patient ID: James Clarke, male    DOB: 1941-03-24, 82 y.o.   MRN: [id number] Chief Complaint  Patient presents with   Follow-up    [facility] 3 week with HDA    The patient returns to the office for followup status post intervention of their left radiocephalic AV fistula Following the intervention the access function has significantly improved, with better flow rates and improved KT/V. The patient has not been experiencing increased bleeding times following decannulation and the patient denies increased recirculation. The patient denies an increase in arm swelling. At the present time the patient denies hand pain.  No recent shortening of the patient's walking distance or new symptoms consistent with claudication.  No history of rest pain symptoms. No new ulcers or wounds of the lower extremities have occurred.  The patient denies amaurosis fugax or recent TIA symptoms. There are no recent neurological changes noted. There is no history of DVT, PE or superficial thrombophlebitis. No recent episodes of angina or shortness of breath documented.   Duplex ultrasound of the AV access shows a patent access.  The previously noted stenosis is improved compared to last study.  Flow volume today is 711 cc/min (previous flow volume was 1329 cc/min)       Review of Systems  All other systems reviewed and are negative.      Objective:   Physical Exam Vitals reviewed.  HENT:     Head: Normocephalic.  Cardiovascular:     Rate and Rhythm: Normal rate.     Pulses:          Radial pulses are 2+ on the left side.     Arteriovenous access: Left arteriovenous access is present.    Comments: Good thrill and bruit Pulmonary:     Effort: Pulmonary effort is normal.  Skin:    General: Skin is warm and dry.  Neurological:     Mental Status: He is alert and oriented to person, place, and time.  Psychiatric:        Mood and Affect: Mood normal.        Behavior: Behavior normal.         Thought Content: Thought content normal.        Judgment: Judgment normal.    BP (!) 147/72 (BP Location: Right Arm)   Pulse 67   Resp 16   Wt (!) 313 lb (142 kg)   BMI 44.91 kg/m   Past Medical History:  Diagnosis Date   (HFpEF) heart failure with preserved ejection fraction (HCC)    a.) TTE 11/26/2021: EF 45%, mod LVH, post HK, mod LAE, mild MR/TR, G2DD; b.) TTE 02/26/2022: EF 55-60%, BAE, triv MR, G2DD; c.) TTE 07/09/2022: EF 45%, post HK, LVH, mod LAE, triv TR/PR, mild MR, G1DD   Anemia of chronic renal failure    Aortic atherosclerosis (HCC)    Asthma    Bell's palsy    BPH (benign prostatic hyperplasia)    CAD (coronary artery disease)    a.) LHC 12/31/2021: 60% mLAD, 20% pRCA, 20% dRCA, 20% o-pLCx --> med mgmt.   CKD (chronic kidney disease), stage IV (HCC)    Diabetic neuropathy (HCC)    Dyspnea on exertion    Ganglion cyst of wrist, right    a.) s/p excision 04/2011   Hepatic steatosis    History of 2019 novel coronavirus disease (COVID-19)    History of bilateral cataract extraction 2018   Hyperlipidemia    Hyperparathyroidism due  to renal insufficiency (HCC)    Hypertension    Low testosterone in male    Nephrolithiasis    Obesity    OSA treated with BiPAP    Osteoarthritis    Peripheral edema    Right inguinal hernia    a.) s/p repair   Skin cancer    Type 2 diabetes mellitus with renal manifestations (HCC)     Social History   Socioeconomic History   Marital status: Married    Spouse name: Not on file   Number of children: Not on file   Years of education: Not on file   Highest education level: Not on file  Occupational History   Not on file  Tobacco Use   Smoking status: Never    Passive exposure: Never   Smokeless tobacco: Never  Vaping Use   Vaping status: Never Used  Substance and Sexual Activity   Alcohol use: No   Drug use: Not Currently   Sexual activity: Yes    Birth control/protection: None  Other Topics Concern   Not on  file  Social History Narrative   Not on file   Social Determinants of Health   Financial Resource Strain: Low Risk  (12/20/2022)   Received from [facility], [facility]   Overall Financial Resource Strain (CARDIA)    Difficulty of Paying Living Expenses: Not hard at all  Food Insecurity: No Food Insecurity (12/20/2022)   Received from [facility], [facility]   Hunger Vital Sign    Worried About Running Out of Food in the Last Year: Never true    Ran Out of Food in the Last Year: Never true  Transportation Needs: No Transportation Needs (12/20/2022)   Received from [facility], [facility]   [facility], [facility], [facility] - Transportation    In the past 12 months, has lack of transportation kept you from medical appointments or from getting medications?: No    Lack of Transportation (Non-Medical): No  Physical Activity: Not on file  Stress: Not on file  Social Connections: Not on file  Intimate Partner Violence: Not At Risk (12/13/2022)   Humiliation, Afraid, Rape, and Kick questionnaire    Fear of Current or Ex-Partner: No    Emotionally Abused: No    Physically Abused: No    Sexually Abused: No    Past Surgical History:  Procedure Laterality Date   A/V FISTULAGRAM Left 01/13/2023   Procedure: A/V Fistulagram;  Surgeon: Annice Needy, MD;  Location: [facility];  Service: Cardiovascular;  Laterality: Left;   APPENDECTOMY     AV FISTULA PLACEMENT Left 09/19/2022   Procedure: ARTERIOVENOUS (AV) FISTULA CREATION (RADIO CEPHALIC);  Surgeon: Annice Needy, MD;  Location: [facility] OR[facility];  Service: Vascular;  Laterality: Left;   CATARACT EXTRACTION W/PHACO Left 05/20/2017   Procedure: CATARACT EXTRACTION PHACO AND INTRAOCULAR LENS PLACEMENT (IOC);  Surgeon: Galen Manila, MD;  Location: [facility];  Service: Ophthalmology;  Laterality: Left;  [place] 00:32.0 AP% 15.5 CDE 4.97 Fluid Pack Lot # [id number]    CATARACT EXTRACTION W/PHACO Right 06/10/2017   Procedure: CATARACT EXTRACTION PHACO AND INTRAOCULAR LENS PLACEMENT (IOC);  Surgeon: Galen Manila, MD;  Location: [facility];  Service: Ophthalmology;  Laterality: Right;  [place]  00:51 AP% 15.4 CDE 7.95 Fluid pack lot # [id number]   CHOLECYSTECTOMY     COLONOSCOPY     x3   CYSTOSCOPY W/ RETROGRADES Right 08/13/2019   Procedure: CYSTOSCOPY WITH  RETROGRADE PYELOGRAM;  Surgeon: Sondra Come, MD;  Location: [facility];  Service: Urology;  Laterality: Right;   CYSTOSCOPY WITH STENT PLACEMENT Right 07/26/2019   Procedure: CYSTOSCOPY WITH STENT PLACEMENT;  Surgeon: Crista Elliot, MD;  Location: [facility];  Service: Urology;  Laterality: Right;   CYSTOSCOPY/URETEROSCOPY/HOLMIUM LASER/STENT PLACEMENT Right 08/13/2019   Procedure: CYSTOSCOPY/URETEROSCOPY/HOLMIUM LASER/STENT EXCHANGE;  Surgeon: Sondra Come, MD;  Location: [facility];  Service: Urology;  Laterality: Right;   CYSTOSCOPY/URETEROSCOPY/HOLMIUM LASER/STENT PLACEMENT Left 02/27/2022   Procedure: CYSTOSCOPY/URETEROSCOPY/HOLMIUM LASER/STENT PLACEMENT;  Surgeon: Riki Altes, MD;  Location: [facility];  Service: Urology;  Laterality: Left;   GANGLION CYST EXCISION Right    INGUINAL HERNIA REPAIR Right    LEFT HEART CATH AND CORONARY ANGIOGRAPHY N/A 12/31/2021   Procedure: LEFT HEART CATH AND CORONARY ANGIOGRAPHY;  Surgeon: Lamar Blinks, MD;  Location: [facility];  Service: Cardiovascular;  Laterality: N/A;   SHOULDER ARTHROSCOPY WITH SUBACROMIAL DECOMPRESSION AND OPEN ROTATOR C Right 09/11/2020   Procedure: Right shoulder arthroscopic rotator cuff repair vs Regeneten patch application, subacromial decompression, and biceps tenodesis - Dedra Skeens to Assist;  Surgeon: Signa Kell, MD;  Location: [facility];  Service: Orthopedics;  Laterality: Right;   TEMPORARY DIALYSIS CATHETER N/A 03/05/2022   Procedure: TEMPORARY DIALYSIS CATHETER;  Surgeon: Renford Dills, MD;  Location:  [facility];  Service: Cardiovascular;  Laterality: N/A;   TONSILLECTOMY     URETEROSCOPY WITH HOLMIUM LASER LITHOTRIPSY Left 02/27/2022   Procedure: URETEROSCOPY WITH HOLMIUM LASER LITHOTRIPSY;  Surgeon: Riki Altes, MD;  Location: [facility];  Service: Urology;  Laterality: Left;   WRIST FRACTURE SURGERY Right     Family History  Problem Relation Age of Onset   Emphysema Mother    COPD Mother    Heart disease Mother    Brain cancer Father     Allergies  Allergen Reactions   Codeine Nausea And Vomiting   Doxycycline    Erythromycin Rash       Latest Ref Rng & Units 12/17/2022    4:22 AM 12/13/2022    2:00 PM 09/19/2022    6:36 AM  CBC  WBC 4.0 - 10.5 K/uL 12.3  14.3    Hemoglobin 13.0 - 17.0 g/dL 9.9  9.9  09.8   Hematocrit 39.0 - 52.0 % 32.2  32.4  32.0   Platelets 150 - 400 K/uL 377  337        CMP     Component Value Date/Time   NA 143 12/17/2022 0422   NA 141 04/10/2022 0000   NA 137 11/27/2013 0412   K 4.5 12/17/2022 0422   K 3.5 11/27/2013 0412   CL 108 12/17/2022 0422   CL 105 11/27/2013 0412   CO2 25 12/17/2022 0422   CO2 25 11/27/2013 0412   GLUCOSE 207 (H) 12/17/2022 0422   GLUCOSE 98 11/27/2013 0412   BUN 65 (H) 01/13/2023 0809   BUN 42 (H) 11/27/2013 0412   CREATININE 3.24 (H) 01/13/2023 0809   CREATININE 2.14 (H) 11/27/2013 0412   CALCIUM 8.5 (L) 12/17/2022 0422   CALCIUM 7.6 (L) 11/27/2013 0412   PROT 7.0 02/26/2022 0948   PROT 7.4 11/27/2013 0412   ALBUMIN 2.8 (L) 12/13/2022 1025   ALBUMIN 3.1 (L) 11/27/2013 0412   AST 21 02/26/2022 0948   AST 47 (H) 11/27/2013 0412   ALT 15 02/26/2022 0948   ALT 41 11/27/2013 0412   ALKPHOS 69 02/26/2022 0948   ALKPHOS  82 11/27/2013 0412   BILITOT 0.7 02/26/2022 0948   BILITOT 0.4 11/27/2013 0412   EGFR 20 (L) 04/10/2022 0000   GFRNONAA 18 (L) 01/13/2023 0809   GFRNONAA 30 (L) 11/27/2013 0412     No results found.     Assessment & Plan:   1. Chronic kidney disease (CKD), stage IV  (severe) (HCC) Recommend:  The patient is doing well and currently has adequate dialysis access. Flow pattern is stable when compared to the prior ultrasound.  The patient has not yet been on dialysis however he can begin to utilize his fistula when nephrology deems it necessary.  The patient should have a duplex ultrasound of the dialysis access in 3 months. The patient will follow-up with me in the office after each ultrasound    2. Primary hypertension Continue antihypertensive medications as already ordered, these medications have been reviewed and there are no changes at this time.  3. Hyperlipidemia, unspecified hyperlipidemia type Continue statin as ordered and reviewed, no changes at this time   Current Outpatient Medications on File Prior to Visit  Medication Sig Dispense Refill   acetaminophen (TYLENOL) 500 MG tablet Take 1,000 mg by mouth every 6 (six) hours as needed (shoulder pain).     albuterol (PROVENTIL HFA;VENTOLIN HFA) 108 (90 Base) MCG/ACT inhaler Inhale 2 puffs into the lungs every 6 (six) hours as needed for wheezing or shortness of breath.     amLODipine (NORVASC) 5 MG tablet Take 1 tablet (5 mg total) by mouth daily. 30 tablet 0   aspirin EC 81 MG tablet Take by mouth.     calcitRIOL (ROCALTROL) 0.25 MCG capsule Take 0.25 mcg by mouth daily.     cetirizine (ZYRTEC) 10 MG chewable tablet Chew 10 mg by mouth daily as needed for allergies.     Continuous Blood Gluc Sensor (FREESTYLE LIBRE 14 DAY SENSOR) MISC by Does not apply route.     fluticasone (FLONASE) 50 MCG/ACT nasal spray as needed.     furosemide (LASIX) 40 MG tablet Take 80 mg q am and 40 mg qpm 60 tablet 2   GLOBAL INJECT EASE INSULIN SYR 31G X 5/16" 0.5 ML MISC Inject into the skin.     glucose blood (ONETOUCH ULTRA) test strip TEST BLOOD SUGAR 4 TIMES DAILY     insulin aspart (NOVOLOG) 100 UNIT/ML injection Inject 14 Units into the skin 3 (three) times daily with meals. (Patient taking differently:  Inject 45 Units into the skin 3 (three) times daily with meals. Pt states he takes 40 units in am 35 units at lunch and 40 units at pm Pt states his insulin dosage was changed today to 20, 20, 20, 18 12/23/22) 10 mL 11   insulin detemir (LEVEMIR) 100 UNIT/ML injection Inject 0.5 mLs (50 Units total) into the skin at bedtime. (Patient taking differently: Inject 42 Units into the skin at bedtime.) 10 mL 1   isosorbide mononitrate (IMDUR) 30 MG 24 hr tablet Take 1 tablet (30 mg total) by mouth daily. 30 tablet 2   Lancets (ONETOUCH DELICA PLUS LANCET30G) MISC USE 1 LANCET 4 TIMES DAILY     lovastatin (MEVACOR) 40 MG tablet Take 40 mg by mouth daily with supper.     metoprolol succinate (TOPROL-XL) 50 MG 24 hr tablet Take 50 mg by mouth daily.     nitroGLYCERIN (NITROSTAT) 0.4 MG SL tablet Place 1 tablet (0.4 mg total) under the tongue every 5 (five) minutes as needed for chest pain. 20 tablet 1  potassium citrate (UROCIT-K) 10 MEQ (1080 MG) SR tablet TAKE (1) TABLET BY MOUTH TWICE DAILY WITH A MEAL. 60 tablet 11   tamsulosin (FLOMAX) 0.4 MG CAPS capsule TAKE (1) CAPSULE BY MOUTH EVERY DAY 30 capsule 1   No current facility-administered medications on file prior to visit.    There are no Patient Instructions on file for this visit. No follow-ups on file.   Georgiana Spinner, NP

## 2023-02-08 ENCOUNTER — Other Ambulatory Visit: Payer: Self-pay | Admitting: Urology

## 2023-02-08 DIAGNOSIS — E1121 Type 2 diabetes mellitus with diabetic nephropathy: Secondary | ICD-10-CM | POA: Diagnosis not present

## 2023-02-17 DIAGNOSIS — N184 Chronic kidney disease, stage 4 (severe): Secondary | ICD-10-CM | POA: Diagnosis not present

## 2023-02-17 DIAGNOSIS — E1122 Type 2 diabetes mellitus with diabetic chronic kidney disease: Secondary | ICD-10-CM | POA: Diagnosis not present

## 2023-02-18 DIAGNOSIS — G4733 Obstructive sleep apnea (adult) (pediatric): Secondary | ICD-10-CM | POA: Diagnosis not present

## 2023-02-24 DIAGNOSIS — I10 Essential (primary) hypertension: Secondary | ICD-10-CM | POA: Diagnosis not present

## 2023-02-24 DIAGNOSIS — R809 Proteinuria, unspecified: Secondary | ICD-10-CM | POA: Diagnosis not present

## 2023-02-24 DIAGNOSIS — D631 Anemia in chronic kidney disease: Secondary | ICD-10-CM | POA: Diagnosis not present

## 2023-02-24 DIAGNOSIS — N184 Chronic kidney disease, stage 4 (severe): Secondary | ICD-10-CM | POA: Diagnosis not present

## 2023-02-24 DIAGNOSIS — N2581 Secondary hyperparathyroidism of renal origin: Secondary | ICD-10-CM | POA: Diagnosis not present

## 2023-02-24 DIAGNOSIS — E1122 Type 2 diabetes mellitus with diabetic chronic kidney disease: Secondary | ICD-10-CM | POA: Diagnosis not present

## 2023-03-03 NOTE — Progress Notes (Unsigned)
PCP: Maudie Flakes, MD (last seen 06/24) Primary Cardiologist: Dorothyann Peng, MD (last seen 06/24)  HPI:  James Clarke is a 82 y/o male with a history of asthma, DM, hyperlipidemia, HTN, CKD, bell's palsy, sleep apnea and chronic heart failure. Had Percutaneous transluminal angioplasty of the distal forearm cephalic vein with 5 mm diameter by 6 cm in length Lutonix drug-coated angioplasty balloon 01/13/23.  Admitted 06/12/22 due to worsening shortness of breath. Patient states that he has had shortness of breath for several days but worse 1 day prior to his admission. He states that he slept in his recliner with his CPAP because he could not breathe laying flat. Cardiology & nephrology consults obtained. Diuretic changed. PT/OT evaluations done. Discharged after 8 days. Admitted 12/13/22 due to due to increased shortness of breath and chest pain. 2 weeks ago, he had a rapid worsening in his dyspnea on exertion and shortness of breath at rest. He has not had any increased urine output since increasing his diuretic regimen as instructed by his nephrologist. Patient was seen on 12/09/2022 by nephrology at which time he was started on IV Lasix 80 mg twice a week with oral Lasix on the rest of the days. Placed on lasix drip with transition to oral diuretics.   Echo 02/26/22: EF of 55-60% along with mild LVH, LAE and trivial James. Echo 07/09/22: EF of 45% along with moderate LVH. Echo 12/17/22: EF 50-55% along with mild LAE.     LHC 12/31/21:    Mid LAD lesion is 60% stenosed.   Prox RCA lesion is 20% stenosed.   Dist RCA lesion is 20% stenosed.   Ost Cx to Prox Cx lesion is 20% stenosed.   The left ventricular systolic function is normal.   LV end diastolic pressure is moderately elevated.   The left ventricular ejection fraction is greater than 65% by visual estimate.  Patient has significant elevated end-diastolic pressure  He presents today for a HF follow-up visit with a chief complaint of moderate SOB  with minimal exertion. Chronic in nature. Has associated fatigue, feet neuropathy, increased pedal edema to mid shin and weight gain along with this. Denies chest pain, palpitations, cough or dizziness. Wearing compression socks daily. Has been having hypoglycemic episodes   Had an increase in his furosemide to 80mg  BID for a few days which did help with the edema/ weight. Since he's been back on 80/40, he notices that the edema is worsening and his weight is slowly rising. No change in sodium/ fluid intake.   He had the angioplasty of his fistula access done 2 months ago and it has healed nicely. No definite start date of dialysis yet.   ROS: All systems negative except as listed in HPI, PMH and Problem List.  SH:  Social History   Socioeconomic History   Marital status: Married    Spouse name: Not on file   Number of children: Not on file   Years of education: Not on file   Highest education level: Not on file  Occupational History   Not on file  Tobacco Use   Smoking status: Never    Passive exposure: Never   Smokeless tobacco: Never  Vaping Use   Vaping status: Never Used  Substance and Sexual Activity   Alcohol use: No   Drug use: Not Currently   Sexual activity: Yes    Birth control/protection: None  Other Topics Concern   Not on file  Social History Narrative   Not on file  Social Determinants of Health   Financial Resource Strain: Low Risk  (12/20/2022)   Received from [facility], [facility]   Overall Financial Resource Strain (CARDIA)    Difficulty of Paying Living Expenses: Not hard at all  Food Insecurity: No Food Insecurity (12/20/2022)   Received from [facility], [facility]   Hunger Vital Sign    Worried About Running Out of Food in the Last Year: Never true    Ran Out of Food in the Last Year: Never true  Transportation Needs: No Transportation Needs (12/20/2022)   Received from [facility], [facility]   [facility] - Transportation    In the past 12 months, has lack of transportation kept you from medical appointments or from getting medications?: No    Lack of Transportation (Non-Medical): No  Physical Activity: Not on file  Stress: Not on file  Social Connections: Not on file  Intimate Partner Violence: Not At Risk (12/13/2022)   Humiliation, Afraid, Rape, and Kick questionnaire    Fear of Current or Ex-Partner: No    Emotionally Abused: No    Physically Abused: No    Sexually Abused: No    FH:  Family History  Problem Relation Age of Onset   Emphysema Mother    COPD Mother    Heart disease Mother    Brain cancer Father     Past Medical History:  Diagnosis Date   (HFpEF) heart failure with preserved ejection fraction (HCC)    a.) TTE 11/26/2021: EF 45%, mod LVH, post HK, mod LAE, mild James/TR, G2DD; b.) TTE 02/26/2022: EF 55-60%, BAE, triv James, G2DD; c.) TTE 07/09/2022: EF 45%, post HK, LVH, mod LAE, triv TR/PR, mild James, G1DD   Anemia of chronic renal failure    Aortic atherosclerosis (HCC)    Asthma    Bell's palsy    BPH (benign prostatic hyperplasia)    CAD (coronary artery disease)    a.) LHC 12/31/2021: 60% mLAD, 20% pRCA, 20% dRCA, 20% o-pLCx --> med mgmt.   CKD (chronic kidney disease), stage IV (HCC)    Diabetic neuropathy (HCC)    Dyspnea on exertion    Ganglion cyst of wrist, right    a.) s/p excision 04/2011   Hepatic steatosis    History of 2019 novel coronavirus disease (COVID-19)    History of bilateral cataract extraction 2018   Hyperlipidemia    Hyperparathyroidism due to renal insufficiency (HCC)    Hypertension    Low testosterone in male    Nephrolithiasis    Obesity    OSA treated with BiPAP    Osteoarthritis    Peripheral edema    Right inguinal hernia    a.) s/p repair   Skin cancer    Type 2 diabetes mellitus with renal manifestations (HCC)     Current Outpatient Medications   Medication Sig Dispense Refill   acetaminophen (TYLENOL) 500 MG tablet Take 1,000 mg by mouth every 6 (six) hours as needed (shoulder pain).     albuterol (PROVENTIL HFA;VENTOLIN HFA) 108 (90 Base) MCG/ACT inhaler Inhale 2 puffs into the lungs every 6 (six) hours as needed for wheezing or shortness of breath.     amLODipine (NORVASC) 5 MG tablet Take 1 tablet (5 mg total) by mouth daily. 30 tablet 0   aspirin EC 81 MG tablet Take by mouth.     calcitRIOL (ROCALTROL) 0.25 MCG capsule Take 0.25 mcg by  mouth daily.     cetirizine (ZYRTEC) 10 MG chewable tablet Chew 10 mg by mouth daily as needed for allergies.     Continuous Blood Gluc Sensor (FREESTYLE LIBRE 14 DAY SENSOR) MISC by Does not apply route.     fluticasone (FLONASE) 50 MCG/ACT nasal spray as needed.     furosemide (LASIX) 40 MG tablet Take 80 mg q am and 40 mg qpm 60 tablet 2   GLOBAL INJECT EASE INSULIN SYR 31G X 5/16" 0.5 ML MISC Inject into the skin.     glucose blood (ONETOUCH ULTRA) test strip TEST BLOOD SUGAR 4 TIMES DAILY     insulin aspart (NOVOLOG) 100 UNIT/ML injection Inject 14 Units into the skin 3 (three) times daily with meals. (Patient taking differently: Inject 45 Units into the skin 3 (three) times daily with meals. Pt states he takes 40 units in am 35 units at lunch and 40 units at pm Pt states his insulin dosage was changed today to 20, 20, 20, 18 12/23/22) 10 mL 11   insulin detemir (LEVEMIR) 100 UNIT/ML injection Inject 0.5 mLs (50 Units total) into the skin at bedtime. (Patient taking differently: Inject 42 Units into the skin at bedtime.) 10 mL 1   isosorbide mononitrate (IMDUR) 30 MG 24 hr tablet Take 1 tablet (30 mg total) by mouth daily. 30 tablet 2   Lancets (ONETOUCH DELICA PLUS LANCET30G) MISC USE 1 LANCET 4 TIMES DAILY     lovastatin (MEVACOR) 40 MG tablet Take 40 mg by mouth daily with supper.     metoprolol succinate (TOPROL-XL) 50 MG 24 hr tablet Take 50 mg by mouth daily.     nitroGLYCERIN (NITROSTAT)  0.4 MG SL tablet Place 1 tablet (0.4 mg total) under the tongue every 5 (five) minutes as needed for chest pain. 20 tablet 1   potassium citrate (UROCIT-K) 10 MEQ (1080 MG) SR tablet TAKE (1) TABLET BY MOUTH TWICE DAILY WITH A MEAL. 60 tablet 11   tamsulosin (FLOMAX) 0.4 MG CAPS capsule TAKE (1) CAPSULE BY MOUTH EVERY DAY 30 capsule 1   No current facility-administered medications for this visit.   Vitals:   03/04/23 1054  BP: (!) 141/57  Pulse: 71  SpO2: 99%  Weight: (!) 315 lb (142.9 kg)  Height: 5\' 10"  (1.778 m)   Wt Readings from Last 3 Encounters:  03/04/23 (!) 315 lb (142.9 kg)  02/05/23 (!) 313 lb (142 kg)  01/13/23 (!) 308 lb (139.7 kg)   Lab Results  Component Value Date   CREATININE 3.24 (H) 01/13/2023   CREATININE 3.57 (H) 12/17/2022   CREATININE 3.71 (H) 12/16/2022   PHYSICAL EXAM:  General:  Well appearing. No resp difficulty HEENT: normal Neck: supple. JVP flat. No lymphadenopathy or thryomegaly appreciated. Cor: PMI normal. Regular rate & rhythm. No rubs, gallops or murmurs. Lungs: clear Abdomen: soft, nontender, nondistended. No hepatosplenomegaly. No bruits or masses.  Extremities: no cyanosis, clubbing, rash, 1+ pitting edema bilateral lower legs Neuro: alert & oriented x3, cranial nerves grossly intact. Moves all 4 extremities w/o difficulty. Affect pleasant. Palpable thrill over left FA fistula   ECG: not done  ReDs: 38%   ASSESSMENT & PLAN:  1: NICM with mildly reduced ejection fraction- - suspect due to HTN/ DM as cath showed nonobstructive CAD - NYHA class III - mildly fluid overloaded today with weight gain, edema, increased symptoms and elevated ReDs reading - weighing daily & home weight ranges from 310-315 pounds which is higher then previously; reminded to  call for an overnight weight gain of > 2 pounds or a weekly weight gain of >5 pounds - weight up 5 pounds from last visit here 2 months ago - ReDs 38% today - Echo 12/17/22: EF 50-55%  along with mild LAE.  - Echo 07/09/22: EF of 45% along with moderate LVH.  - Echo 02/26/22: EF of 55-60% along with mild LVH, LAE and trivial James.  - LHC 12/31/21:    Mid LAD lesion is 60% stenosed.   Prox RCA lesion is 20% stenosed.   Dist RCA lesion is 20% stenosed.   Ost Cx to Prox Cx lesion is 20% stenosed.   The left ventricular systolic function is normal.   LV end diastolic pressure is moderately elevated.   The left ventricular ejection fraction is greater than 65% by visual estimate.  Patient has significant elevated end-diastolic pressure - continue furosemide 80mg  AM/ 40mg  PM; will reach out to nephrology to see about going up to 80mg  BID - has labs scheduled next week w/ nephrology already - continue metoprolol succinate 50mg  daily - renal function limits other GDMT therapy - not adding salt and wife reads food labels for sodium content and understands to keep daily intake to 2000mg  - drinking 36 ounces of water, 1 glass of milk and a diet sprite daily - saw cardiology Juliann Pares) 06/24 - saw pulmonology Karna Christmas) 06/24 - wearing compression socks daily with removal at bedtime - BNP 12/13/22 was 415.8   2: HTN- - BP 141/57 - continue amlodipine 5mg  QD - saw PCP (Feldpausch) 06/24 - BMP 02/17/23 reviewed and showed sodium 144, potassium 4.5, creatinine 3.74 & GFR 15  3: DM with CKD- - A1c 01/14/23 was 8.8% - glucose in office per his meter was 122; has been having hypoglycemic episodes - saw nephrology Cherylann Ratel) 08/24 - saw endocrinology (Solum) 07/24 - saw vascular Manson Passey) 07/24 - had percutaneous transluminal angioplasty of the distal forearm cephalic vein with 5 mm diameter by 6 cm in length Lutonix drug-coated angioplasty balloon 01/13/23 - + palpable thrill over left FA fistula site - nearing ESRD  Return in 3 months, sooner if needed.

## 2023-03-04 ENCOUNTER — Encounter: Payer: Self-pay | Admitting: Family

## 2023-03-04 ENCOUNTER — Ambulatory Visit: Payer: PPO | Attending: Family | Admitting: Family

## 2023-03-04 VITALS — BP 141/57 | HR 71 | Ht 70.0 in | Wt 315.0 lb

## 2023-03-04 DIAGNOSIS — Z95820 Peripheral vascular angioplasty status with implants and grafts: Secondary | ICD-10-CM | POA: Diagnosis not present

## 2023-03-04 DIAGNOSIS — I5032 Chronic diastolic (congestive) heart failure: Secondary | ICD-10-CM | POA: Insufficient documentation

## 2023-03-04 DIAGNOSIS — I5022 Chronic systolic (congestive) heart failure: Secondary | ICD-10-CM

## 2023-03-04 DIAGNOSIS — N184 Chronic kidney disease, stage 4 (severe): Secondary | ICD-10-CM | POA: Diagnosis not present

## 2023-03-04 DIAGNOSIS — I10 Essential (primary) hypertension: Secondary | ICD-10-CM | POA: Diagnosis not present

## 2023-03-04 DIAGNOSIS — E1122 Type 2 diabetes mellitus with diabetic chronic kidney disease: Secondary | ICD-10-CM | POA: Diagnosis not present

## 2023-03-04 DIAGNOSIS — I428 Other cardiomyopathies: Secondary | ICD-10-CM | POA: Insufficient documentation

## 2023-03-04 DIAGNOSIS — Z794 Long term (current) use of insulin: Secondary | ICD-10-CM

## 2023-03-04 DIAGNOSIS — I130 Hypertensive heart and chronic kidney disease with heart failure and stage 1 through stage 4 chronic kidney disease, or unspecified chronic kidney disease: Secondary | ICD-10-CM | POA: Insufficient documentation

## 2023-03-10 DIAGNOSIS — E1140 Type 2 diabetes mellitus with diabetic neuropathy, unspecified: Secondary | ICD-10-CM | POA: Diagnosis not present

## 2023-03-10 DIAGNOSIS — B351 Tinea unguium: Secondary | ICD-10-CM | POA: Diagnosis not present

## 2023-03-10 DIAGNOSIS — Z794 Long term (current) use of insulin: Secondary | ICD-10-CM | POA: Diagnosis not present

## 2023-03-11 DIAGNOSIS — E1121 Type 2 diabetes mellitus with diabetic nephropathy: Secondary | ICD-10-CM | POA: Diagnosis not present

## 2023-03-11 DIAGNOSIS — Z794 Long term (current) use of insulin: Secondary | ICD-10-CM | POA: Diagnosis not present

## 2023-03-11 DIAGNOSIS — E1122 Type 2 diabetes mellitus with diabetic chronic kidney disease: Secondary | ICD-10-CM | POA: Diagnosis not present

## 2023-03-11 DIAGNOSIS — E1142 Type 2 diabetes mellitus with diabetic polyneuropathy: Secondary | ICD-10-CM | POA: Diagnosis not present

## 2023-03-11 DIAGNOSIS — I10 Essential (primary) hypertension: Secondary | ICD-10-CM | POA: Diagnosis not present

## 2023-03-11 DIAGNOSIS — N184 Chronic kidney disease, stage 4 (severe): Secondary | ICD-10-CM | POA: Diagnosis not present

## 2023-03-12 DIAGNOSIS — N184 Chronic kidney disease, stage 4 (severe): Secondary | ICD-10-CM | POA: Diagnosis not present

## 2023-03-12 DIAGNOSIS — E1122 Type 2 diabetes mellitus with diabetic chronic kidney disease: Secondary | ICD-10-CM | POA: Diagnosis not present

## 2023-03-21 DIAGNOSIS — N185 Chronic kidney disease, stage 5: Secondary | ICD-10-CM | POA: Diagnosis not present

## 2023-03-21 DIAGNOSIS — E1165 Type 2 diabetes mellitus with hyperglycemia: Secondary | ICD-10-CM | POA: Diagnosis not present

## 2023-03-21 DIAGNOSIS — I110 Hypertensive heart disease with heart failure: Secondary | ICD-10-CM | POA: Diagnosis not present

## 2023-03-21 DIAGNOSIS — E1122 Type 2 diabetes mellitus with diabetic chronic kidney disease: Secondary | ICD-10-CM | POA: Diagnosis not present

## 2023-03-21 DIAGNOSIS — E1142 Type 2 diabetes mellitus with diabetic polyneuropathy: Secondary | ICD-10-CM | POA: Diagnosis not present

## 2023-03-21 DIAGNOSIS — Z794 Long term (current) use of insulin: Secondary | ICD-10-CM | POA: Diagnosis not present

## 2023-03-21 DIAGNOSIS — E11649 Type 2 diabetes mellitus with hypoglycemia without coma: Secondary | ICD-10-CM | POA: Diagnosis not present

## 2023-03-21 DIAGNOSIS — I5030 Unspecified diastolic (congestive) heart failure: Secondary | ICD-10-CM | POA: Diagnosis not present

## 2023-03-21 DIAGNOSIS — Z6841 Body Mass Index (BMI) 40.0 and over, adult: Secondary | ICD-10-CM | POA: Diagnosis not present

## 2023-03-21 DIAGNOSIS — G4733 Obstructive sleep apnea (adult) (pediatric): Secondary | ICD-10-CM | POA: Diagnosis not present

## 2023-03-21 DIAGNOSIS — Z515 Encounter for palliative care: Secondary | ICD-10-CM | POA: Diagnosis not present

## 2023-03-21 DIAGNOSIS — N2581 Secondary hyperparathyroidism of renal origin: Secondary | ICD-10-CM | POA: Diagnosis not present

## 2023-03-25 DIAGNOSIS — N2581 Secondary hyperparathyroidism of renal origin: Secondary | ICD-10-CM | POA: Diagnosis not present

## 2023-03-25 DIAGNOSIS — N184 Chronic kidney disease, stage 4 (severe): Secondary | ICD-10-CM | POA: Diagnosis not present

## 2023-03-25 DIAGNOSIS — E1122 Type 2 diabetes mellitus with diabetic chronic kidney disease: Secondary | ICD-10-CM | POA: Diagnosis not present

## 2023-03-25 DIAGNOSIS — I10 Essential (primary) hypertension: Secondary | ICD-10-CM | POA: Diagnosis not present

## 2023-03-25 DIAGNOSIS — D631 Anemia in chronic kidney disease: Secondary | ICD-10-CM | POA: Diagnosis not present

## 2023-03-25 DIAGNOSIS — R809 Proteinuria, unspecified: Secondary | ICD-10-CM | POA: Diagnosis not present

## 2023-03-31 DIAGNOSIS — E1122 Type 2 diabetes mellitus with diabetic chronic kidney disease: Secondary | ICD-10-CM | POA: Diagnosis not present

## 2023-03-31 DIAGNOSIS — N185 Chronic kidney disease, stage 5: Secondary | ICD-10-CM | POA: Diagnosis not present

## 2023-03-31 DIAGNOSIS — I10 Essential (primary) hypertension: Secondary | ICD-10-CM | POA: Diagnosis not present

## 2023-03-31 DIAGNOSIS — N2581 Secondary hyperparathyroidism of renal origin: Secondary | ICD-10-CM | POA: Diagnosis not present

## 2023-03-31 DIAGNOSIS — D631 Anemia in chronic kidney disease: Secondary | ICD-10-CM | POA: Diagnosis not present

## 2023-04-01 DIAGNOSIS — I10 Essential (primary) hypertension: Secondary | ICD-10-CM | POA: Diagnosis not present

## 2023-04-01 DIAGNOSIS — I2510 Atherosclerotic heart disease of native coronary artery without angina pectoris: Secondary | ICD-10-CM | POA: Diagnosis not present

## 2023-04-01 DIAGNOSIS — N184 Chronic kidney disease, stage 4 (severe): Secondary | ICD-10-CM | POA: Diagnosis not present

## 2023-04-01 DIAGNOSIS — I700 Atherosclerosis of aorta: Secondary | ICD-10-CM | POA: Diagnosis not present

## 2023-04-01 DIAGNOSIS — G4733 Obstructive sleep apnea (adult) (pediatric): Secondary | ICD-10-CM | POA: Diagnosis not present

## 2023-04-01 DIAGNOSIS — E7800 Pure hypercholesterolemia, unspecified: Secondary | ICD-10-CM | POA: Diagnosis not present

## 2023-04-01 DIAGNOSIS — I5032 Chronic diastolic (congestive) heart failure: Secondary | ICD-10-CM | POA: Diagnosis not present

## 2023-04-05 ENCOUNTER — Other Ambulatory Visit: Payer: Self-pay | Admitting: Urology

## 2023-04-07 DIAGNOSIS — Z992 Dependence on renal dialysis: Secondary | ICD-10-CM | POA: Diagnosis not present

## 2023-04-07 DIAGNOSIS — D509 Iron deficiency anemia, unspecified: Secondary | ICD-10-CM | POA: Diagnosis not present

## 2023-04-07 DIAGNOSIS — N186 End stage renal disease: Secondary | ICD-10-CM | POA: Diagnosis not present

## 2023-04-07 DIAGNOSIS — Z23 Encounter for immunization: Secondary | ICD-10-CM | POA: Diagnosis not present

## 2023-04-10 ENCOUNTER — Telehealth (INDEPENDENT_AMBULATORY_CARE_PROVIDER_SITE_OTHER): Payer: Self-pay

## 2023-04-10 NOTE — Telephone Encounter (Signed)
Patient spouse left a message stating that husband went to dialysis for his first treatment on Friday and was not able to do dialysis. He went Monday and received a short treatment,then he went Wednesday the tech tried to stick three times but small clots came out. Since yesterday the patient left arm is swollen,painful, and warm to touch. Patient left av fistula creation on 09/19/22. Patient spouse is requesting for the patient to be seen.

## 2023-04-10 NOTE — Telephone Encounter (Signed)
Spoke with the patient and he is scheduled on 04/11/23 with a 8:00 am arrival time to the [facility] for a permcath insertion possible thrombectomy with Dr. Wyn Quaker. Pre-procedure instructions were discussed and will be sent to Mychart.

## 2023-04-10 NOTE — Telephone Encounter (Signed)
So he's getting a perm cath tomorrow but we should get him in for an HDA to evaluate.

## 2023-04-11 ENCOUNTER — Telehealth (INDEPENDENT_AMBULATORY_CARE_PROVIDER_SITE_OTHER): Payer: Self-pay

## 2023-04-11 ENCOUNTER — Encounter
Admission: RE | Disposition: A | Discharge status: Home / Self Care | Payer: Self-pay | Source: Home / Self Care | Attending: Vascular Surgery

## 2023-04-11 ENCOUNTER — Encounter: Payer: Self-pay | Admitting: Vascular Surgery

## 2023-04-11 ENCOUNTER — Ambulatory Visit
Admission: RE | Admit: 2023-04-11 | Discharge: 2023-04-11 | Disposition: A | Discharge status: Home / Self Care | Payer: PPO | Attending: Vascular Surgery | Admitting: Vascular Surgery

## 2023-04-11 ENCOUNTER — Other Ambulatory Visit: Payer: Self-pay

## 2023-04-11 DIAGNOSIS — N186 End stage renal disease: Secondary | ICD-10-CM | POA: Insufficient documentation

## 2023-04-11 DIAGNOSIS — G4733 Obstructive sleep apnea (adult) (pediatric): Secondary | ICD-10-CM | POA: Insufficient documentation

## 2023-04-11 DIAGNOSIS — E11649 Type 2 diabetes mellitus with hypoglycemia without coma: Secondary | ICD-10-CM | POA: Insufficient documentation

## 2023-04-11 DIAGNOSIS — Z7984 Long term (current) use of oral hypoglycemic drugs: Secondary | ICD-10-CM | POA: Insufficient documentation

## 2023-04-11 DIAGNOSIS — Z8616 Personal history of COVID-19: Secondary | ICD-10-CM | POA: Diagnosis not present

## 2023-04-11 DIAGNOSIS — I2510 Atherosclerotic heart disease of native coronary artery without angina pectoris: Secondary | ICD-10-CM | POA: Diagnosis not present

## 2023-04-11 DIAGNOSIS — I132 Hypertensive heart and chronic kidney disease with heart failure and with stage 5 chronic kidney disease, or end stage renal disease: Secondary | ICD-10-CM | POA: Diagnosis not present

## 2023-04-11 DIAGNOSIS — Y832 Surgical operation with anastomosis, bypass or graft as the cause of abnormal reaction of the patient, or of later complication, without mention of misadventure at the time of the procedure: Secondary | ICD-10-CM | POA: Insufficient documentation

## 2023-04-11 DIAGNOSIS — E1140 Type 2 diabetes mellitus with diabetic neuropathy, unspecified: Secondary | ICD-10-CM | POA: Insufficient documentation

## 2023-04-11 DIAGNOSIS — I5032 Chronic diastolic (congestive) heart failure: Secondary | ICD-10-CM | POA: Diagnosis not present

## 2023-04-11 DIAGNOSIS — Z8249 Family history of ischemic heart disease and other diseases of the circulatory system: Secondary | ICD-10-CM | POA: Insufficient documentation

## 2023-04-11 DIAGNOSIS — E1121 Type 2 diabetes mellitus with diabetic nephropathy: Secondary | ICD-10-CM | POA: Diagnosis not present

## 2023-04-11 DIAGNOSIS — Z992 Dependence on renal dialysis: Secondary | ICD-10-CM | POA: Insufficient documentation

## 2023-04-11 DIAGNOSIS — E1122 Type 2 diabetes mellitus with diabetic chronic kidney disease: Secondary | ICD-10-CM | POA: Insufficient documentation

## 2023-04-11 DIAGNOSIS — T82868A Thrombosis of vascular prosthetic devices, implants and grafts, initial encounter: Secondary | ICD-10-CM | POA: Insufficient documentation

## 2023-04-11 DIAGNOSIS — D631 Anemia in chronic kidney disease: Secondary | ICD-10-CM | POA: Diagnosis not present

## 2023-04-11 HISTORY — PX: A/V SHUNT INTERVENTION: CATH118220

## 2023-04-11 HISTORY — PX: DIALYSIS/PERMA CATHETER INSERTION: CATH118288

## 2023-04-11 LAB — GLUCOSE, CAPILLARY
Glucose-Capillary: 261 mg/dL — ABNORMAL HIGH (ref 70–99)
Glucose-Capillary: 215 mg/dL — ABNORMAL HIGH (ref 70–99)

## 2023-04-11 LAB — POTASSIUM (ARMC VASCULAR LAB ONLY): Potassium (ARMC vascular lab): 3.9 mmol/L (ref 3.5–5.1)

## 2023-04-11 SURGERY — DIALYSIS/PERMA CATHETER INSERTION
Anesthesia: Moderate Sedation

## 2023-04-11 MED ORDER — MIDAZOLAM HCL 2 MG/2ML IJ SOLN
INTRAMUSCULAR | Status: DC | PRN
  Administered 2023-04-11: 1 mg via INTRAVENOUS

## 2023-04-11 MED ORDER — HEPARIN SODIUM (PORCINE) 1000 UNIT/ML IJ SOLN
INTRAMUSCULAR | Status: AC
  Filled 2023-04-11: qty 10

## 2023-04-11 MED ORDER — FENTANYL CITRATE (PF) 100 MCG/2ML IJ SOLN
INTRAMUSCULAR | Status: DC | PRN
  Administered 2023-04-11: 50 ug via INTRAVENOUS

## 2023-04-11 MED ORDER — MIDAZOLAM HCL 2 MG/ML PO SYRP: 8.0000 mg | ORAL_SOLUTION | Freq: Once | ORAL | Status: DC | PRN

## 2023-04-11 MED ORDER — SODIUM CHLORIDE 0.9 % IV SOLN: INTRAVENOUS | Status: DC

## 2023-04-11 MED ORDER — CEFAZOLIN SODIUM-DEXTROSE 1-4 GM/50ML-% IV SOLN
1.0000 g | INTRAVENOUS | Status: AC
  Administered 2023-04-11: 1 g via INTRAVENOUS

## 2023-04-11 MED ORDER — CEFAZOLIN SODIUM-DEXTROSE 1-4 GM/50ML-% IV SOLN
INTRAVENOUS | Status: AC
  Filled 2023-04-11: qty 50

## 2023-04-11 MED ORDER — MIDAZOLAM HCL 2 MG/2ML IJ SOLN
INTRAMUSCULAR | Status: AC
  Filled 2023-04-11: qty 2

## 2023-04-11 MED ORDER — FAMOTIDINE 20 MG PO TABS: 40.0000 mg | ORAL_TABLET | Freq: Once | ORAL | Status: DC | PRN

## 2023-04-11 MED ORDER — IODIXANOL 320 MG/ML IV SOLN
INTRAVENOUS | Status: DC | PRN
  Administered 2023-04-11: 5 mL via INTRA_ARTERIAL

## 2023-04-11 MED ORDER — METHYLPREDNISOLONE SODIUM SUCC 125 MG IJ SOLR: 125.0000 mg | Freq: Once | INTRAMUSCULAR | Status: DC | PRN

## 2023-04-11 MED ORDER — LIDOCAINE-EPINEPHRINE (PF) 1 %-1:200000 IJ SOLN
INTRAMUSCULAR | Status: DC | PRN
  Administered 2023-04-11: 20 mL via INTRADERMAL

## 2023-04-11 MED ORDER — FENTANYL CITRATE PF 50 MCG/ML IJ SOSY
PREFILLED_SYRINGE | INTRAMUSCULAR | Status: AC
  Filled 2023-04-11: qty 1

## 2023-04-11 MED ORDER — HYDROMORPHONE HCL 1 MG/ML IJ SOLN: 1.0000 mg | Freq: Once | INTRAMUSCULAR | Status: DC | PRN

## 2023-04-11 MED ORDER — DIPHENHYDRAMINE HCL 50 MG/ML IJ SOLN: 50.0000 mg | Freq: Once | INTRAMUSCULAR | Status: DC | PRN

## 2023-04-11 MED ORDER — HEPARIN (PORCINE) IN NACL 2000-0.9 UNIT/L-% IV SOLN
INTRAVENOUS | Status: DC | PRN
  Administered 2023-04-11: 1000 mL

## 2023-04-11 MED ORDER — ONDANSETRON HCL 4 MG/2ML IJ SOLN: 4.0000 mg | Freq: Four times a day (QID) | INTRAMUSCULAR | Status: DC | PRN

## 2023-04-11 MED ORDER — HEPARIN SODIUM (PORCINE) 10000 UNIT/ML IJ SOLN
INTRAMUSCULAR | Status: DC | PRN
  Administered 2023-04-11: 10000 [IU]

## 2023-04-11 SURGICAL SUPPLY — 10 items
ADH SKN CLS APL DERMABOND .7 (GAUZE/BANDAGES/DRESSINGS) ×2
CANNULA 5F STIFF (CANNULA) IMPLANT
CATH CANNON HEMO 15FR 19 (HEMODIALYSIS SUPPLIES) IMPLANT
COVER PROBE ULTRASOUND 5X96 (MISCELLANEOUS) IMPLANT
DERMABOND ADVANCED .7 DNX12 (GAUZE/BANDAGES/DRESSINGS) IMPLANT
DRAPE BRACHIAL (DRAPES) IMPLANT
PACK ANGIOGRAPHY (CUSTOM PROCEDURE TRAY) IMPLANT
SHEATH BRITE TIP 6FRX5.5 (SHEATH) IMPLANT
SUT MNCRL AB 4-0 PS2 18 (SUTURE) IMPLANT
SUT PROLENE 0 CT 1 30 (SUTURE) IMPLANT

## 2023-04-11 NOTE — Telephone Encounter (Signed)
Pt's wife called concerned about a purple area about the size of a quarter on pt's neck with a raise area/knot.  She would like to know if she should clean it or leave it alone.  After speaking with Sheppard Plumber NP she states to leave it alone until Pt goes to dialysis on Monday and let them look at it.  I called and explained this to the pts wife and she states verbal understanding.

## 2023-04-11 NOTE — Op Note (Signed)
OPERATIVE NOTE    PRE-OPERATIVE DIAGNOSIS: 1. ESRD 2.  Nonsalvageable left radiocephalic AV fistula  POST-OPERATIVE DIAGNOSIS: same as above  PROCEDURE: Ultrasound guidance for vascular access to the right internal jugular vein Fluoroscopic guidance for placement of catheter Placement of a 19 cm tip to cuff tunneled hemodialysis catheter via the right internal jugular vein  SURGEON: Festus Barren, MD  ANESTHESIA:  Local with Moderate conscious sedation for approximately 32 minutes using 1 mg of Versed and 50 mcg of Fentanyl (for both procedures)  ESTIMATED BLOOD LOSS: 10 cc  FLUORO TIME: less than one minute  CONTRAST: none  FINDING(S): 1.  Patent right internal jugular vein  SPECIMEN(S):  None  INDICATIONS:   James Clarke is a 82 y.o.male who presents with a nonsalvageable left radiocephalic AV fistula.  The patient needs long term dialysis access for their ESRD, and a Permcath is necessary.  Risks and benefits are discussed and informed consent is obtained.    DESCRIPTION: After obtaining full informed written consent, the patient was brought back to the vascular suited. The patient's right neck and chest were sterilely prepped and draped in a sterile surgical field was created. Moderate conscious sedation was administered during a face to face encounter with the patient throughout the procedure with my supervision of the RN administering medicines and monitoring the patient's vital signs, pulse oximetry, telemetry and mental status throughout from the start of the procedure until the patient was taken to the recovery room.  The right internal jugular vein was visualized with ultrasound and found to be patent. It was then accessed under direct ultrasound guidance and a permanent image was recorded. A wire was placed. After skin nick and dilatation, the peel-away sheath was placed over the wire. I then turned my attention to an area under the clavicle. Approximately 1-2  fingerbreadths below the clavicle a small counterincision was created and tunneled from the subclavicular incision to the access site. Using fluoroscopic guidance, a 19 centimeter tip to cuff tunneled hemodialysis catheter was selected, and tunneled from the subclavicular incision to the access site. It was then placed through the peel-away sheath and the peel-away sheath was removed. Using fluoroscopic guidance the catheter tips were parked in the right atrium. The appropriate distal connectors were placed. It withdrew blood well and flushed easily with heparinized saline and a concentrated heparin solution was then placed. It was secured to the chest wall with 2 Prolene sutures. The access incision was closed single 4-0 Monocryl. A 4-0 Monocryl pursestring suture was placed around the exit site. Sterile dressings were placed. The patient tolerated the procedure well and was taken to the recovery room in stable condition.  COMPLICATIONS: None  CONDITION: Stable  Festus Barren, MD 04/11/2023 10:09 AM   This note was created with Dragon Medical transcription system. Any errors in dictation are purely unintentional.

## 2023-04-11 NOTE — H&P (Signed)
[facility] Admission History & Physical  MRN : [id number]  James Clarke is a 82 y.o. (23-Aug-1940) male who presents with chief complaint of No chief complaint on file. Marland Kitchen  History of Present Illness: I am asked to evaluate the patient by the dialysis center. The patient was sent here because they were unable to cannulate the the fistula this morning. Furthermore the Center states there is no thrill or bruit. The patient states this is the first dialysis run to be missed. This problem is acute in onset and has been present for approximately 2 days. The patient is unaware of any other change.   Patient denies pain or tenderness overlying the access.  There is no pain with dialysis.  The patient denies hand pain or finger pain consistent with steal syndrome.    There have been many past interventions or declots of this access.  The patient is not chronically hypotensive on dialysis.  Current Facility-Administered Medications  Medication Dose Route Frequency Provider Last Rate Last Admin   0.9 %  sodium chloride infusion   Intravenous Continuous Georgiana Spinner, NP       ceFAZolin (ANCEF) IVPB 1 g/50 mL premix  1 g Intravenous 30 min Pre-Op Georgiana Spinner, NP       diphenhydrAMINE (BENADRYL) injection 50 mg  50 mg Intravenous Once PRN Georgiana Spinner, NP       famotidine (PEPCID) tablet 40 mg  40 mg Oral Once PRN Georgiana Spinner, NP       HYDROmorphone (DILAUDID) injection 1 mg  1 mg Intravenous Once PRN Georgiana Spinner, NP       methylPREDNISolone sodium succinate (SOLU-MEDROL) 125 mg/2 mL injection 125 mg  125 mg Intravenous Once PRN Georgiana Spinner, NP       midazolam (VERSED) 2 MG/ML syrup 8 mg  8 mg Oral Once PRN Georgiana Spinner, NP       ondansetron [facility]) injection 4 mg  4 mg Intravenous Q6H PRN Georgiana Spinner, NP        Past Medical History:  Diagnosis Date   (HFpEF) heart failure with preserved ejection fraction (HCC)    a.) TTE 11/26/2021: EF 45%,  mod LVH, post HK, mod LAE, mild MR/TR, G2DD; b.) TTE 02/26/2022: EF 55-60%, BAE, triv MR, G2DD; c.) TTE 07/09/2022: EF 45%, post HK, LVH, mod LAE, triv TR/PR, mild MR, G1DD   Anemia of chronic renal failure    Aortic atherosclerosis (HCC)    Asthma    Bell's palsy    BPH (benign prostatic hyperplasia)    CAD (coronary artery disease)    a.) LHC 12/31/2021: 60% mLAD, 20% pRCA, 20% dRCA, 20% o-pLCx --> med mgmt.   CKD (chronic kidney disease), stage IV (HCC)    Diabetic neuropathy (HCC)    Dyspnea on exertion    Ganglion cyst of wrist, right    a.) s/p excision 04/2011   Hepatic steatosis    History of 2019 novel coronavirus disease (COVID-19)    History of bilateral cataract extraction 2018   Hyperlipidemia    Hyperparathyroidism due to renal insufficiency (HCC)    Hypertension    Low testosterone in male    Nephrolithiasis    Obesity    OSA treated with BiPAP    Osteoarthritis    Peripheral edema    Right inguinal hernia    a.) s/p repair   Skin cancer    Type 2 diabetes mellitus with renal  manifestations [facility])     Past Surgical History:  Procedure Laterality Date   A/V FISTULAGRAM Left 01/13/2023   Procedure: A/V Fistulagram;  Surgeon: Annice Needy, MD;  Location: [facility] INVASIVE CV LAB;  Service: Cardiovascular;  Laterality: Left;   APPENDECTOMY     AV FISTULA PLACEMENT Left 09/19/2022   Procedure: ARTERIOVENOUS (AV) FISTULA CREATION (RADIO CEPHALIC);  Surgeon: Annice Needy, MD;  Location: [facility] ORS;  Service: Vascular;  Laterality: Left;   CATARACT EXTRACTION W/PHACO Left 05/20/2017   Procedure: CATARACT EXTRACTION PHACO AND INTRAOCULAR LENS PLACEMENT (IOC);  Surgeon: Galen Manila, MD;  Location: [facility] ORS;  Service: Ophthalmology;  Laterality: Left;  [place] 00:32.0 AP% 15.5 CDE 4.97 Fluid Pack Lot # [id number]   CATARACT EXTRACTION W/PHACO Right 06/10/2017   Procedure: CATARACT EXTRACTION PHACO AND INTRAOCULAR LENS PLACEMENT (IOC);  Surgeon: Galen Manila, MD;  Location:  [facility] ORS;  Service: Ophthalmology;  Laterality: Right;  [place]  00:51 AP% 15.4 CDE 7.95 Fluid pack lot # [id number]   CHOLECYSTECTOMY     COLONOSCOPY     x3   CYSTOSCOPY W/ RETROGRADES Right 08/13/2019   Procedure: CYSTOSCOPY WITH RETROGRADE PYELOGRAM;  Surgeon: Sondra Come, MD;  Location: [facility] ORS;  Service: Urology;  Laterality: Right;   CYSTOSCOPY WITH STENT PLACEMENT Right 07/26/2019   Procedure: CYSTOSCOPY WITH STENT PLACEMENT;  Surgeon: Crista Elliot, MD;  Location: [facility] ORS;  Service: Urology;  Laterality: Right;   CYSTOSCOPY/URETEROSCOPY/HOLMIUM LASER/STENT PLACEMENT Right 08/13/2019   Procedure: CYSTOSCOPY/URETEROSCOPY/HOLMIUM LASER/STENT EXCHANGE;  Surgeon: Sondra Come, MD;  Location: [facility] ORS;  Service: Urology;  Laterality: Right;   CYSTOSCOPY/URETEROSCOPY/HOLMIUM LASER/STENT PLACEMENT Left 02/27/2022   Procedure: CYSTOSCOPY/URETEROSCOPY/HOLMIUM LASER/STENT PLACEMENT;  Surgeon: Riki Altes, MD;  Location: [facility] ORS;  Service: Urology;  Laterality: Left;   GANGLION CYST EXCISION Right    INGUINAL HERNIA REPAIR Right    LEFT HEART CATH AND CORONARY ANGIOGRAPHY N/A 12/31/2021   Procedure: LEFT HEART CATH AND CORONARY ANGIOGRAPHY;  Surgeon: Lamar Blinks, MD;  Location: [facility] INV[facility] CV [facility];  Service: Cardiovascular;  Laterality: N/A;   SHOULDER ARTHROSCOPY WITH SUBACROMIAL DECOMPRESSION AND OPEN ROTATOR C Right 09/11/2020   Procedure: Right shoulder arthroscopic rotator cuff repair vs Regeneten patch application, subacromial decompression, and biceps tenodesis - Dedra Skeens to Assist;  Surgeon: Signa Kell, MD;  Location: [facility] ORS;  Service: Orthopedics;  Laterality: Right;   TEMPORARY DIALYSIS CATHETER N/A 03/05/2022   Procedure: TEMPORARY DIALYSIS CATHETER;  Surgeon: Renford Dills, MD;  Location: [facility];  Service: Cardiovascular;  Laterality: N/A;   TONSILLECTOMY     URETEROSCOPY WITH HOLMIUM LASER LITHOTRIPSY Left 02/27/2022   Procedure:  URETEROSCOPY WITH HOLMIUM LASER LITHOTRIPSY;  Surgeon: Riki Altes, MD;  Location: [facility] OR[facility];  Service: Urology;  Laterality: Left;   WRIST FRACTURE SURGERY Right      Social History   Tobacco Use   Smoking status: Never    Passive exposure: Never   Smokeless tobacco: Never  Vaping Use   Vaping status: Never Used  Substance Use Topics   Alcohol use: No   Drug use: Not Currently     Family History  Problem Relation Age of Onset   Emphysema Mother    COPD Mother    Heart disease Mother    Brain cancer Father     No family history of bleeding or clotting disorders, autoimmune disease or porphyria  Allergies  Allergen Reactions   Codeine Nausea And Vomiting   Doxycycline    Erythromycin  Rash     REVIEW OF SYSTEMS (Negative unless checked)  Constitutional: [] Weight loss  [] Fever  [] Chills Cardiac: [] Chest pain   [] Chest pressure   [] Palpitations   [] Shortness of breath when laying flat   [] Shortness of breath at rest   [x] Shortness of breath with exertion. Vascular:  [] Pain in legs with walking   [] Pain in legs at rest   [] Pain in legs when laying flat   [] Claudication   [] Pain in feet when walking  [] Pain in feet at rest  [] Pain in feet when laying flat   [] History of DVT   [] Phlebitis   [] Swelling in legs   [] Varicose veins   [] Non-healing ulcers Pulmonary:   [] Uses home oxygen   [] Productive cough   [] Hemoptysis   [] Wheeze  [] COPD   [] Asthma Neurologic:  [] Dizziness  [] Blackouts   [] Seizures   [] History of stroke   [] History of TIA  [] Aphasia   [] Temporary blindness   [] Dysphagia   [] Weakness or numbness in arms   [] Weakness or numbness in legs Musculoskeletal:  [x] Arthritis   [] Joint swelling   [x] Joint pain   [] Low back pain Hematologic:  [] Easy bruising  [] Easy bleeding   [] Hypercoagulable state   [x] Anemic  [] Hepatitis Gastrointestinal:  [] Blood in stool   [] Vomiting blood  [] Gastroesophageal reflux/heartburn   [] Difficulty swallowing. Genitourinary:  [x] Chronic  kidney disease   [] Difficult urination  [] Frequent urination  [] Burning with urination   [] Blood in urine Skin:  [] Rashes   [] Ulcers   [] Wounds Psychological:  [] History of anxiety   []  History of major depression.  Physical Examination  Vitals:   04/11/23 0826  BP: (!) 164/79  Pulse: 69  Resp: 14  Temp: (!) 97.5 F (36.4 C)  TempSrc: Oral  SpO2: 97%  Weight: (!) 140.8 kg  Height: 5\' 10"  (1.778 m)   Body mass index is 44.55 kg/m. Gen: WD/WN, NAD Head: [place]/AT, No temporalis wasting.  Ear/Nose/Throat: Hearing grossly intact, nares w/o erythema or drainage, oropharynx w/o Erythema/Exudate,  Eyes: Conjunctiva clear, sclera non-icteric Neck: Trachea midline.  No JVD.  Pulmonary:  Good air movement, respirations not labored, no use of accessory muscles.  Cardiac: RRR, normal S1, S2. Vascular:  Vessel Right Left  Radial Palpable Palpable  nges.  No deformity or atrophy.  Neurologic: Sensation grossly intact in extremities.  Symmetrical.  Speech is fluent. Motor exam as listed above. Psychiatric: Judgment intact, Mood & affect appropriate for pt's clinical situation. Dermatologic: No rashes or ulcers noted.  No cellulitis or open wounds. Lymph : No Cervical, Axillary, or Inguinal lymphadenopathy.   CBC Lab Results  Component Value Date   WBC 12.3 (H) 12/17/2022   HGB 9.9 (L) 12/17/2022   HCT 32.2 (L) 12/17/2022   MCV 91.5 12/17/2022   PLT 377 12/17/2022    BMET    Component Value Date/Time   NA 143 12/17/2022 0422   NA 141 04/10/2022 0000   NA 137 11/27/2013 0412   K 4.5 12/17/2022 0422   K 3.5 11/27/2013 0412   CL 108 12/17/2022 0422   CL 105 11/27/2013 0412   CO2 25 12/17/2022 0422   CO2 25 11/27/2013 0412   GLUCOSE 207 (H) 12/17/2022 0422   GLUCOSE 98 11/27/2013 0412   BUN 65 (H) 01/13/2023 0809   BUN 42 (H) 11/27/2013 0412   CREATININE 3.24 (H) 01/13/2023 0809   CREATININE 2.14 (H) 11/27/2013 0412   CALCIUM 8.5 (L) 12/17/2022 0422   CALCIUM 7.6 (L)  11/27/2013 0412   GFRNONAA 18 (  L) 01/13/2023 0809   GFRNONAA 30 (L) 11/27/2013 0412   GFRAA 30 (L) 03/13/2020 1531   GFRAA 34 (L) 11/27/2013 0412   CrCl cannot be calculated (Patient's most recent lab result is older than the maximum 21 days allowed.).  COAG Lab Results  Component Value Date   INR 1.0 09/08/2020    Radiology No results found.  Assessment/Plan 1.  Complication dialysis device with thrombosis AV access:  Patient's dialysis access is thrombosed. The patient will undergo thrombectomy using interventional techniques.  The risks and benefits were described to the patient.  All questions were answered.  The patient agrees to proceed with angiography and intervention. Potassium will be drawn to ensure that it is an appropriate level prior to performing thrombectomy. 2.  End-stage renal disease requiring hemodialysis:  Patient will continue dialysis therapy without further interruption if a successful thrombectomy is not achieved then catheter will be placed. Dialysis has already been arranged since the patient missed their previous session 3.  Hypertension:  Patient will continue medical management; nephrology is following no changes in oral medications. 4. Diabetes mellitus:  Glucose will be monitored and oral medications been held this morning once the patient has undergone the patient's procedure po intake will be reinitiated and again Accu-Cheks will be used to assess the blood glucose level and treat as needed. The patient will be restarted on the patient's usual hypoglycemic regime 5.  Coronary artery disease:  EKG will be monitored. Nitrates will be used if needed. The patient's oral cardiac medications will be continued.    Festus Barren, MD  04/11/2023 8:29 AM

## 2023-04-11 NOTE — Op Note (Signed)
 VEIN AND VASCULAR SURGERY    OPERATIVE NOTE   PROCEDURE: 1.   Left radiocephalic arteriovenous fistula cannulation under ultrasound guidance 2.   Left arm fistulagram    PRE-OPERATIVE DIAGNOSIS: 1. ESRD 2. Poorly functional, thrombosed left brachiocephalic AVF  POST-OPERATIVE DIAGNOSIS: same as above with nonsalvageable left radiocephalic AV fistula  SURGEON: Festus Barren, MD  ANESTHESIA: local with MCS  ESTIMATED BLOOD LOSS: 3 cc  FINDING(S): Small thrombosed left radiocephalic AV fistula which was not salvageable  SPECIMEN(S):  None  CONTRAST: 5 cc  FLUORO TIME: Less than 1 minute  MODERATE CONSCIOUS SEDATION TIME: Approximately 32 minutes with 1 mg of Versed and 50 mcg of Fentanyl (for both procedures)  INDICATIONS: James Clarke is a 82 y.o. male who presents with malfunctioning thrombosed left radiocephalic arteriovenous fistula.  The patient is scheduled for left arm fistulagram.  The patient is aware the risks include but are not limited to: bleeding, infection, thrombosis of the cannulated access, and possible anaphylactic reaction to the contrast.  The patient is aware of the risks of the procedure and elects to proceed forward.  DESCRIPTION: After full informed written consent was obtained, the patient was brought back to the angiography suite and placed supine upon the angiography table.  The patient was connected to monitoring equipment. Moderate conscious sedation was administered with a face to face encounter with the patient throughout the procedure with my supervision of the RN administering medicines and monitoring the patient's vital signs and mental status throughout from the start of the procedure until the patient was taken to the recovery room. The left arm was prepped and draped in the standard fashion for a percutaneous access intervention.  Under ultrasound guidance, the left radiocephalic arteriovenous fistula was cannulated with a micropuncture  needle under direct ultrasound guidance where it was patent and a permanent image was performed.  The microwire was advanced into the fistula and the needle was exchanged for the a microsheath.  An image was then performed of the left radiocephalic AV fistula through the micropuncture sheath.  This demonstrated a small thrombosed left radiocephalic AV fistula which was clearly not salvageable.  Of note, the cephalic vein at the antecubital fossa was large and patent and likely could be used for a left brachiocephalic AV fistula. Based on the completion imaging, no further intervention is necessary as the fistula was not salvageable.  A permcath will be place and dictated separately.  A new left brachiocephalic AV fistula will be planned.  A sterile bandage was applied to the puncture site.  COMPLICATIONS: None  CONDITION: Stable   Festus Barren  04/11/2023 10:05 AM   This note was created with Dragon Medical transcription system. Any errors in dictation are purely unintentional.

## 2023-04-11 NOTE — Telephone Encounter (Signed)
That's fine

## 2023-04-11 NOTE — Discharge Instructions (Signed)
Tunneled Catheter Insertion, Care After The following information offers guidance on how to care for yourself after your procedure. Your health care provider may also give you more specific instructions. If you have problems or questions, contact your health care provider. What can I expect after the procedure? After the procedure, it is common to have: Some mild redness, bruising, swelling, and pain around your catheter site. A small amount of blood or clear fluid coming from your incisions. Follow these instructions at home: Medicines Take over-the-counter and prescription medicines only as told by your health care provider. If you were prescribed an antibiotic medicine, take it as told by your health care provider. Do not stop taking the antibiotic even if you start to feel better. Incision care  Follow instructions from your health care provider about how to take care of your incisions. Make sure you: Your dialysis center will perform all needed dressing changes.  Leave stitches (sutures), skin glue, or adhesive strips in place. These skin closures may need to stay in place for 2 weeks or longer. Keep your dressings clean and dry. Check your incision areas every day for signs of infection. Check for: More redness, swelling, or pain. More fluid or blood. Warmth. Pus or a bad smell. Catheter care  Keep your catheter site clean and dry. Do not shower, sponge bath only while catheter in place.  Your dialysis center will Flush your catheter per the dialysis center protocol.  This helps prevent it from becoming clogged. Leave thecaps on the ends of the catheter when not in use. Do not pull on your catheter. Activity Return to your normal activities as told by your health care provider. Ask your health care provider what activities are safe for you. Follow any other activity restrictions as instructed by your health care provider. Do not lift anything that is heavier than 10 lb (4.5 kg), or  the limit that you are told, until your health care provider says that it is safe. Driving Do not drive for 24 hours.  General instructions Follow your health care provider's specific instructions for the type of catheter that you have. Do not take baths, swim, or use a hot tub while your catheter is in place. Keep all follow-up visits. This is important to prevent infection.  Contact a health care provider if: You feel unusually weak or nauseous. You have a fever or chills. You have more redness, swelling, or pain at your incisions or around the area where your catheter has been inserted or where it exits. You have pus or a bad smell coming from your catheter site. Your catheter site feels warm to the touch. Your catheter is not working properly. Fluid is leaking from the catheter, under the dressing, or around the dressing. Your dialysis center is unable to flush your catheter. Get help right away if: Your catheter develops a hole or it breaks. Your catheter comes loose or gets pulled completely out. If this happens, press on your catheter site firmly with a clean cloth until you can get medical help. You develop bleeding from your catheter or your insertion site, and your bleeding does not stop. You have swelling in your shoulder, neck, chest, or face. You have pain or swelling when fluids or medicines are being given through the catheter. You have chest pain or difficulty breathing. These symptoms may represent a serious problem that is an emergency. Do not wait to see if the symptoms will go away. Get medical help right away. Call your  local emergency services (911 in the [place] Do not drive yourself to the hospital. Summary After the procedure, it is common to have mild redness, swelling, and pain around your catheter site. Return to your normal activities as told by your health care provider. Ask your health care provider what activities are safe for you. Follow your health care  provider's specific instructions for the type of catheter that you have. Keep your catheter site and your dressings clean and dry. Contact a health care provider if your catheter is not working properly. Get help right away if you have chest pain, difficulty breathing, or your catheter comes loose or gets pulled completely out. This information is not intended to replace advice given to you by your health care provider. Make sure you discuss any questions you have with your health care provider. Document Revised: 11/13/2020 Document Reviewed: 11/13/2020 Elsevier Patient Education  2023 [employer].

## 2023-04-15 DIAGNOSIS — E1121 Type 2 diabetes mellitus with diabetic nephropathy: Secondary | ICD-10-CM | POA: Diagnosis not present

## 2023-04-15 DIAGNOSIS — E1142 Type 2 diabetes mellitus with diabetic polyneuropathy: Secondary | ICD-10-CM | POA: Diagnosis not present

## 2023-04-15 DIAGNOSIS — N186 End stage renal disease: Secondary | ICD-10-CM | POA: Diagnosis not present

## 2023-04-15 DIAGNOSIS — Z992 Dependence on renal dialysis: Secondary | ICD-10-CM | POA: Diagnosis not present

## 2023-04-15 DIAGNOSIS — E1122 Type 2 diabetes mellitus with diabetic chronic kidney disease: Secondary | ICD-10-CM | POA: Diagnosis not present

## 2023-04-15 DIAGNOSIS — Z794 Long term (current) use of insulin: Secondary | ICD-10-CM | POA: Diagnosis not present

## 2023-04-15 DIAGNOSIS — I10 Essential (primary) hypertension: Secondary | ICD-10-CM | POA: Diagnosis not present

## 2023-04-21 ENCOUNTER — Emergency Department
Admission: EM | Admit: 2023-04-21 | Discharge: 2023-04-21 | Disposition: A | Discharge status: Home / Self Care | Payer: PPO | Attending: Emergency Medicine | Admitting: Emergency Medicine

## 2023-04-21 ENCOUNTER — Other Ambulatory Visit: Payer: Self-pay

## 2023-04-21 DIAGNOSIS — N186 End stage renal disease: Secondary | ICD-10-CM | POA: Diagnosis not present

## 2023-04-21 DIAGNOSIS — I120 Hypertensive chronic kidney disease with stage 5 chronic kidney disease or end stage renal disease: Secondary | ICD-10-CM | POA: Diagnosis not present

## 2023-04-21 DIAGNOSIS — F321 Major depressive disorder, single episode, moderate: Secondary | ICD-10-CM | POA: Diagnosis not present

## 2023-04-21 DIAGNOSIS — Z992 Dependence on renal dialysis: Secondary | ICD-10-CM | POA: Insufficient documentation

## 2023-04-21 DIAGNOSIS — D72829 Elevated white blood cell count, unspecified: Secondary | ICD-10-CM | POA: Diagnosis not present

## 2023-04-21 DIAGNOSIS — R410 Disorientation, unspecified: Secondary | ICD-10-CM | POA: Diagnosis not present

## 2023-04-21 DIAGNOSIS — I953 Hypotension of hemodialysis: Secondary | ICD-10-CM | POA: Diagnosis not present

## 2023-04-21 DIAGNOSIS — F419 Anxiety disorder, unspecified: Secondary | ICD-10-CM | POA: Diagnosis not present

## 2023-04-21 DIAGNOSIS — G4733 Obstructive sleep apnea (adult) (pediatric): Secondary | ICD-10-CM | POA: Diagnosis not present

## 2023-04-21 DIAGNOSIS — E1122 Type 2 diabetes mellitus with diabetic chronic kidney disease: Secondary | ICD-10-CM | POA: Diagnosis not present

## 2023-04-21 DIAGNOSIS — I959 Hypotension, unspecified: Secondary | ICD-10-CM | POA: Diagnosis not present

## 2023-04-21 DIAGNOSIS — R42 Dizziness and giddiness: Secondary | ICD-10-CM | POA: Diagnosis not present

## 2023-04-21 LAB — COMPREHENSIVE METABOLIC PANEL
ALT: 16 U/L (ref 0–44)
AST: 29 U/L (ref 15–41)
Albumin: 3.1 g/dL — ABNORMAL LOW (ref 3.5–5.0)
Alkaline Phosphatase: 76 U/L (ref 38–126)
Anion gap: 14 (ref 5–15)
BUN: 21 mg/dL (ref 8–23)
CO2: 25 mmol/L (ref 22–32)
Calcium: 8.5 mg/dL — ABNORMAL LOW (ref 8.9–10.3)
Chloride: 98 mmol/L (ref 98–111)
Creatinine, Ser: 1.93 mg/dL — ABNORMAL HIGH (ref 0.61–1.24)
GFR, Estimated: 34 mL/min — ABNORMAL LOW (ref >60)
Glucose, Bld: 186 mg/dL — ABNORMAL HIGH (ref 70–99)
Potassium: 3.5 mmol/L (ref 3.5–5.1)
Sodium: 137 mmol/L (ref 135–145)
Total Bilirubin: 0.2 mg/dL — ABNORMAL LOW (ref 0.3–1.2)
Total Protein: 7.7 g/dL (ref 6.5–8.1)

## 2023-04-21 LAB — CBC WITH DIFFERENTIAL/PLATELET
Abs Immature Granulocytes: 0.11 10*3/uL — ABNORMAL HIGH (ref 0.00–0.07)
Basophils Absolute: 0.1 10*3/uL (ref 0.0–0.1)
Basophils Relative: 1 %
Eosinophils Absolute: 0.4 10*3/uL (ref 0.0–0.5)
Eosinophils Relative: 3 %
HCT: 34.6 % — ABNORMAL LOW (ref 39.0–52.0)
Hemoglobin: 11.2 g/dL — ABNORMAL LOW (ref 13.0–17.0)
Immature Granulocytes: 1 %
Lymphocytes Relative: 13 %
Lymphs Abs: 1.7 10*3/uL (ref 0.7–4.0)
MCH: 28.9 pg (ref 26.0–34.0)
MCHC: 32.4 g/dL (ref 30.0–36.0)
MCV: 89.2 fL (ref 80.0–100.0)
Monocytes Absolute: 1 10*3/uL (ref 0.1–1.0)
Monocytes Relative: 7 %
Neutro Abs: 9.6 10*3/uL — ABNORMAL HIGH (ref 1.7–7.7)
Neutrophils Relative %: 75 %
Platelets: 329 10*3/uL (ref 150–400)
RBC: 3.88 MIL/uL — ABNORMAL LOW (ref 4.22–5.81)
RDW: 14.3 % (ref 11.5–15.5)
WBC: 12.8 10*3/uL — ABNORMAL HIGH (ref 4.0–10.5)
nRBC: 0 % (ref 0.0–0.2)

## 2023-04-21 LAB — CBG MONITORING, ED: Glucose-Capillary: 201 mg/dL — ABNORMAL HIGH (ref 70–99)

## 2023-04-21 LAB — MAGNESIUM: Magnesium: 1.9 mg/dL (ref 1.7–2.4)

## 2023-04-21 MED ORDER — LACTATED RINGERS IV BOLUS
1000.0000 mL | Freq: Once | INTRAVENOUS | Status: AC
  Administered 2023-04-21: 1000 mL via INTRAVENOUS

## 2023-04-21 NOTE — ED Provider Notes (Signed)
[facility] Provider Note    Event Date/Time   First MD Initiated Contact with Patient 04/21/23 1706     (approximate)   History   orthostatic hypotension   HPI James Clarke is a 82 y.o. male with HTN, DM2, HLD, ESRD on dialysis, morbid obesity, OSA, CAD, HFpEF presenting today for lightheadedness.  Patient was at his fourth of her dialysis session today when he completed a full 4 hours.  Afterwards, he was leaving and felt lightheaded.  They checked his blood sugar at home with his family and found it to be in the 60s.  They gave him oral repletion which improved his blood sugar but was still feeling lightheaded.  EMS was called and he was found to have low blood pressure at that time.  And route blood pressure was improving along with improvement in symptoms.  At this time, patient denies any lightheaded symptoms but does still feel slightly weak especially in the legs.  He denies any chest pain, shortness of breath, abdominal pain, nausea, vomiting, abdominal pain, or recent illness.  Chart review, patient had echo on 12/18/2022 with EF of 50 to 55%.     Physical Exam   Triage Vital Signs: ED Triage Vitals  Encounter Vitals Group     BP      Systolic BP Percentile      Diastolic BP Percentile      Pulse      Resp      Temp      Temp src      SpO2      Weight      Height      Head Circumference      Peak Flow      Pain Score      Pain Loc      Pain Education      Exclude from Growth Chart     Most recent vital signs: Vitals:   04/21/23 1724 04/21/23 1900  BP: 137/60 (!) 142/93  Pulse: 64 66  Resp: 19 (!) 22  Temp: 97.6 F (36.4 C)   SpO2: 100% 99%    Physical Exam: I have reviewed the vital signs and nursing notes. General: Awake, alert, no acute distress.  Nontoxic appearing.  Obese. Head:  Atraumatic, normocephalic.   ENT:  EOM intact, PERRL. Oral mucosa is pink and moist with no lesions. Neck: Neck is supple with full range  of motion, No meningeal signs. Cardiovascular:  RRR, No murmurs. Peripheral pulses palpable and equal bilaterally. Respiratory:  Symmetrical chest wall expansion.  No rhonchi, rales, or wheezes.  Good air movement throughout.  No use of accessory muscles.   Musculoskeletal:  No cyanosis or edema. Moving extremities with full ROM Abdomen:  Soft, nontender, nondistended. Neuro:  GCS 15, moving all four extremities, interacting appropriately. Speech clear. Psych:  Calm, appropriate.   Skin:  Warm, dry, no rash.    ED Results / Procedures / Treatments   Labs (all labs ordered are listed, but only abnormal results are displayed) Labs Reviewed  COMPREHENSIVE METABOLIC PANEL - Abnormal; Notable for the following components:      Result Value   Glucose, Bld 186 (*)    Creatinine, Ser 1.93 (*)    Calcium 8.5 (*)    Albumin 3.1 (*)    Total Bilirubin 0.2 (*)    GFR, Estimated 34 (*)    All other components within normal limits  CBC WITH DIFFERENTIAL/PLATELET - Abnormal; Notable for  the following components:   WBC 12.8 (*)    RBC 3.88 (*)    Hemoglobin 11.2 (*)    HCT 34.6 (*)    Neutro Abs 9.6 (*)    Abs Immature Granulocytes 0.11 (*)    All other components within normal limits  CBG MONITORING, ED - Abnormal; Notable for the following components:   Glucose-Capillary 201 (*)    All other components within normal limits  MAGNESIUM     EKG Independent interpretation of EKG: Rate of 68, normal sinus rhythm, normal axis, normal intervals.  No acute ST elevations or depressions   RADIOLOGY    PROCEDURES:  Critical Care performed: No  Procedures   MEDICATIONS ORDERED IN ED: Medications  lactated ringers bolus 1,000 mL (1,000 mLs Intravenous New Bag/Given 04/21/23 1815)     IMPRESSION / MDM / ASSESSMENT AND PLAN / ED COURSE  I reviewed the triage vital signs and the nursing notes.                              Differential diagnosis includes, but is not limited to,  orthostatic hypotension, hypovolemia hypotension, dehydration, cardiac arrhythmia, electrolyte abnormality.  Patient's presentation is most consistent with acute illness / injury with system symptoms.  Patient is an 82 year old male presenting today for lightheadedness and hypotension following dialysis session.  This is his fourth dialysis session ever but first time experiencing this.  He also incidentally had a low blood sugar with family at home originally.  Blood glucose normal level here and blood pressure back up.  Patient is feeling better on arrival at this time and denies any lightheaded symptoms.  He notes some weakness in his legs but otherwise feels good.  EKG without signs of abnormality.  Laboratory workup is overall reassuring without electrolyte abnormalities.  Patient was given 1 L fluids here and feels back to baseline.  I suspect at this time this was related to hypovolemia following dialysis.  Patient is safe for discharge at this time and given strict return precautions and instructions to get to the dialysis team going forward.  He is agreeable with plan.  The patient is on the cardiac monitor to evaluate for evidence of arrhythmia and/or significant heart rate changes. Clinical Course as of 04/21/23 2023  Mon Apr 21, 2023  1803 CBC with Differential(!) Mild leukocytosis with history of prior.  No infectious symptoms at this time.  Hemoglobin stable. [DW]  1817 Comprehensive metabolic panel(!) Unremarkable [DW]  1817 Magnesium Unremarkable [DW]  1847 Patient feeling better at this time.  Fluids just started and will reevaluate once they are complete for likely discharge [DW]    Clinical Course User Index [DW] Janith Lima, MD     FINAL CLINICAL IMPRESSION(S) / ED DIAGNOSES   Final diagnoses:  Hemodialysis-associated hypotension     Rx / DC Orders   ED Discharge Orders     None        Note:  This document was prepared using Dragon voice recognition  software and may include unintentional dictation errors.   Janith Lima, MD 04/21/23 2025

## 2023-04-21 NOTE — ED Triage Notes (Signed)
Pt arrived via [facility] from home with c/o orthostatic hypotension and AMS. Pt received 4 hours of dialysis this morning and family noted that the pt was confused when they reached the house, pt was also dizzy and unable to get out of the car unassisted. Pt received their 4th ever dialysis treatment today which lasted 4 hours.

## 2023-04-21 NOTE — Discharge Instructions (Addendum)
You were seen for your hypotension following dialysis.  I suspect this was related to pulling fluid off of your body during dialysis to cause a low blood pressure.  Please discuss this with your dialysis team next time given today's event and coming to the emergency department.  Please maintain hydration in between episodes.  Please return for any worsening symptoms.

## 2023-04-23 DIAGNOSIS — D509 Iron deficiency anemia, unspecified: Secondary | ICD-10-CM | POA: Diagnosis not present

## 2023-04-23 DIAGNOSIS — Z992 Dependence on renal dialysis: Secondary | ICD-10-CM | POA: Diagnosis not present

## 2023-04-23 DIAGNOSIS — N186 End stage renal disease: Secondary | ICD-10-CM | POA: Diagnosis not present

## 2023-04-29 ENCOUNTER — Encounter (INDEPENDENT_AMBULATORY_CARE_PROVIDER_SITE_OTHER): Payer: Self-pay | Admitting: Vascular Surgery

## 2023-04-29 ENCOUNTER — Ambulatory Visit (INDEPENDENT_AMBULATORY_CARE_PROVIDER_SITE_OTHER): Payer: PPO | Admitting: Vascular Surgery

## 2023-04-29 VITALS — BP 160/78 | HR 70 | Resp 18 | Ht 70.0 in | Wt 308.6 lb

## 2023-04-29 DIAGNOSIS — M7989 Other specified soft tissue disorders: Secondary | ICD-10-CM | POA: Diagnosis not present

## 2023-04-29 DIAGNOSIS — Z794 Long term (current) use of insulin: Secondary | ICD-10-CM | POA: Diagnosis not present

## 2023-04-29 DIAGNOSIS — N186 End stage renal disease: Secondary | ICD-10-CM

## 2023-04-29 DIAGNOSIS — E785 Hyperlipidemia, unspecified: Secondary | ICD-10-CM | POA: Diagnosis not present

## 2023-04-29 DIAGNOSIS — E1122 Type 2 diabetes mellitus with diabetic chronic kidney disease: Secondary | ICD-10-CM | POA: Diagnosis not present

## 2023-04-29 DIAGNOSIS — Z992 Dependence on renal dialysis: Secondary | ICD-10-CM | POA: Diagnosis not present

## 2023-04-29 NOTE — Assessment & Plan Note (Signed)
His PermCath placed 2 weeks ago was working well.  I believe a left brachiocephalic AV fistula will be his next permanent option.  However, with his current hypotension I would not recommend placing that currently as it will likely thrombose.  I would prefer to give him a couple of months of getting used to dialysis and hopefully his blood pressure will stabilize so that we can perform this with expectation of durability.  I will just plan to see him back in 6 to 8 weeks and follow-up to see how his blood pressure is doing and discuss left brachiocephalic AV fistula creation at that time.

## 2023-04-29 NOTE — Assessment & Plan Note (Signed)
Some better with dialysis

## 2023-04-29 NOTE — Assessment & Plan Note (Signed)
lipid control important in reducing the progression of atherosclerotic disease. Continue statin therapy  

## 2023-04-29 NOTE — Progress Notes (Signed)
MRN : [id number]  James Clarke is a 82 y.o. (10-06-1940) male who presents with chief complaint of  Chief Complaint  Patient presents with   Follow-up    2 week f/u post perm cath  .  History of Present Illness: Patient returns today in follow up of his dialysis access situation.  He had a PermCath placed a couple of weeks ago for a failed left radiocephalic AV fistula.  This was placed earlier this year and never really matured to be used reliably.  Recently, he has been having a lot more issues with hypotension and hypoglycemia and the hypotension may explain why the fistula thrombosed.  On his evaluation of his left radiocephalic AV fistula, he appeared to have a large and patent cephalic vein at the antecubital fossa and going proximal.  Current Outpatient Medications  Medication Sig Dispense Refill   acetaminophen (TYLENOL) 500 MG tablet Take 1,000 mg by mouth every 6 (six) hours as needed (shoulder pain).     albuterol (PROVENTIL HFA;VENTOLIN HFA) 108 (90 Base) MCG/ACT inhaler Inhale 2 puffs into the lungs every 6 (six) hours as needed for wheezing or shortness of breath.     aspirin EC 81 MG tablet Take by mouth.     calcitRIOL (ROCALTROL) 0.25 MCG capsule Take 0.25 mcg by mouth daily.     cetirizine (ZYRTEC) 10 MG chewable tablet Chew 10 mg by mouth daily as needed for allergies.     Continuous Blood Gluc Sensor (FREESTYLE LIBRE 14 DAY SENSOR) MISC by Does not apply route.     fluticasone (FLONASE) 50 MCG/ACT nasal spray as needed.     furosemide (LASIX) 40 MG tablet Take 80 mg q am and 40 mg qpm 60 tablet 2   GLOBAL INJECT EASE INSULIN SYR 31G X 5/16" 0.5 ML MISC Inject into the skin.     glucose blood (ONETOUCH ULTRA) test strip TEST BLOOD SUGAR 4 TIMES DAILY     insulin aspart (NOVOLOG) 100 UNIT/ML injection Inject 14 Units into the skin 3 (three) times daily with meals. (Patient taking differently: Inject 45 Units into the skin 3 (three) times daily with meals. Pt states he  takes 40 units in am 35 units at lunch and 40 units at pm Pt states his insulin dosage was changed today to 20, 20, 20, 18 12/23/22) 10 mL 11   insulin detemir (LEVEMIR) 100 UNIT/ML injection Inject 0.5 mLs (50 Units total) into the skin at bedtime. (Patient taking differently: Inject 42 Units into the skin at bedtime.) 10 mL 1   isosorbide mononitrate (IMDUR) 30 MG 24 hr tablet Take 1 tablet (30 mg total) by mouth daily. 30 tablet 2   Lancets (ONETOUCH DELICA PLUS LANCET30G) MISC USE 1 LANCET 4 TIMES DAILY     lovastatin (MEVACOR) 40 MG tablet Take 40 mg by mouth daily with supper.     metoprolol succinate (TOPROL-XL) 50 MG 24 hr tablet Take 50 mg by mouth daily.     potassium citrate (UROCIT-K) 10 MEQ (1080 MG) SR tablet TAKE (1) TABLET BY MOUTH TWICE DAILY WITH A MEAL. 60 tablet 11   tamsulosin (FLOMAX) 0.4 MG CAPS capsule TAKE (1) CAPSULE BY MOUTH EVERY DAY 30 capsule 1   nitroGLYCERIN (NITROSTAT) 0.4 MG SL tablet Place 1 tablet (0.4 mg total) under the tongue every 5 (five) minutes as needed for chest pain. (Patient not taking: Reported on 03/04/2023) 20 tablet 1   No current facility-administered medications for this visit.  Past Medical History:  Diagnosis Date   (HFpEF) heart failure with preserved ejection fraction (HCC)    a.) TTE 11/26/2021: EF 45%, mod LVH, post HK, mod LAE, mild MR/TR, G2DD; b.) TTE 02/26/2022: EF 55-60%, BAE, triv MR, G2DD; c.) TTE 07/09/2022: EF 45%, post HK, LVH, mod LAE, triv TR/PR, mild MR, G1DD   Anemia of chronic renal failure    Aortic atherosclerosis (HCC)    Asthma    Bell's palsy    BPH (benign prostatic hyperplasia)    CAD (coronary artery disease)    a.) LHC 12/31/2021: 60% mLAD, 20% pRCA, 20% dRCA, 20% o-pLCx --> med mgmt.   CKD (chronic kidney disease), stage IV (HCC)    Diabetic neuropathy (HCC)    Dyspnea on exertion    Ganglion cyst of wrist, right    a.) s/p excision 04/2011   Hepatic steatosis    History of 2019 novel coronavirus disease  (COVID-19)    History of bilateral cataract extraction 2018   Hyperlipidemia    Hyperparathyroidism due to renal insufficiency (HCC)    Hypertension    Low testosterone in male    Nephrolithiasis    Obesity    OSA treated with BiPAP    Osteoarthritis    Peripheral edema    Right inguinal hernia    a.) s/p repair   Skin cancer    Type 2 diabetes mellitus with renal manifestations [facility])     Past Surgical History:  Procedure Laterality Date   A/V FISTULAGRAM Left 01/13/2023   Procedure: A/V Fistulagram;  Surgeon: Annice Needy, MD;  Location: [facility] INVASIVE CV LAB;  Service: Cardiovascular;  Laterality: Left;   A/V SHUNT INTERVENTION Left 04/11/2023   Procedure: A/V SHUNT INTERVENTION;  Surgeon: Annice Needy, MD;  Location: [facility] INVASIVE CV LAB;  Service: Cardiovascular;  Laterality: Left;   APPENDECTOMY     AV FISTULA PLACEMENT Left 09/19/2022   Procedure: ARTERIOVENOUS (AV) FISTULA CREATION (RADIO CEPHALIC);  Surgeon: Annice Needy, MD;  Location: [facility] ORS;  Service: Vascular;  Laterality: Left;   CATARACT EXTRACTION W/PHACO Left 05/20/2017   Procedure: CATARACT EXTRACTION PHACO AND INTRAOCULAR LENS PLACEMENT (IOC);  Surgeon: Galen Manila, MD;  Location: [facility] ORS;  Service: Ophthalmology;  Laterality: Left;  [place] 00:32.0 AP% 15.5 CDE 4.97 Fluid Pack Lot # [id number]   CATARACT EXTRACTION W/PHACO Right 06/10/2017   Procedure: CATARACT EXTRACTION PHACO AND INTRAOCULAR LENS PLACEMENT (IOC);  Surgeon: Galen Manila, MD;  Location: [facility] ORS;  Service: Ophthalmology;  Laterality: Right;  [place]  00:51 AP% 15.4 CDE 7.95 Fluid pack lot # [id number]   CHOLECYSTECTOMY     COLONOSCOPY     x3   CYSTOSCOPY W/ RETROGRADES Right 08/13/2019   Procedure: CYSTOSCOPY WITH RETROGRADE PYELOGRAM;  Surgeon: Sondra Come, MD;  Location: [facility] ORS;  Service: Urology;  Laterality: Right;   CYSTOSCOPY WITH STENT PLACEMENT Right 07/26/2019   Procedure: CYSTOSCOPY WITH STENT PLACEMENT;  Surgeon: Crista Elliot, MD;  Location: [facility] ORS;  Service: Urology;  Laterality: Right;   CYSTOSCOPY/URETEROSCOPY/HOLMIUM LASER/STENT PLACEMENT Right 08/13/2019   Procedure: CYSTOSCOPY/URETEROSCOPY/HOLMIUM LASER/STENT EXCHANGE;  Surgeon: Sondra Come, MD;  Location: [facility] ORS;  Service: Urology;  Laterality: Right;   CYSTOSCOPY/URETEROSCOPY/HOLMIUM LASER/STENT PLACEMENT Left 02/27/2022   Procedure: CYSTOSCOPY/URETEROSCOPY/HOLMIUM LASER/STENT PLACEMENT;  Surgeon: Riki Altes, MD;  Location: [facility] ORS;  Service: Urology;  Laterality: Left;   DIALYSIS/PERMA CATHETER INSERTION N/A 04/11/2023   Procedure: DIALYSIS/PERMA CATHETER INSERTION;  Surgeon: Annice Needy, MD;  Location: [facility] INVASIVE  CV LAB;  Service: Cardiovascular;  Laterality: N/A;   GANGLION CYST EXCISION Right    INGUINAL HERNIA REPAIR Right    LEFT HEART CATH AND CORONARY ANGIOGRAPHY N/A 12/31/2021   Procedure: LEFT HEART CATH AND CORONARY ANGIOGRAPHY;  Surgeon: Lamar Blinks, MD;  Location: [facility] INVASIVE CV LAB;  Service: Cardiovascular;  Laterality: N/A;   SHOULDER ARTHROSCOPY WITH SUBACROMIAL DECOMPRESSION AND OPEN ROTATOR C Right 09/11/2020   Procedure: Right shoulder arthroscopic rotator cuff repair vs Regeneten patch application, subacromial decompression, and biceps tenodesis - Dedra Skeens to Assist;  Surgeon: Signa Kell, MD;  Location: [facility] ORS;  Service: Orthopedics;  Laterality: Right;   TEMPORARY DIALYSIS CATHETER N/A 03/05/2022   Procedure: TEMPORARY DIALYSIS CATHETER;  Surgeon: Renford Dills, MD;  Location: [facility] INVASIVE CV LAB;  Service: Cardiovascular;  Laterality: N/A;   TONSILLECTOMY     URETEROSCOPY WITH HOLMIUM LASER LITHOTRIPSY Left 02/27/2022   Procedure: URETEROSCOPY WITH HOLMIUM LASER LITHOTRIPSY;  Surgeon: Riki Altes, MD;  Location: [facility] ORS;  Service: Urology;  Laterality: Left;   WRIST FRACTURE SURGERY Right      Social History   Tobacco Use   Smoking status: Never    Passive exposure: Never    Smokeless tobacco: Never  Vaping Use   Vaping status: Never Used  Substance Use Topics   Alcohol use: No   Drug use: Not Currently      Family History  Problem Relation Age of Onset   Emphysema Mother    COPD Mother    Heart disease Mother    Brain cancer Father      Allergies  Allergen Reactions   Codeine Nausea And Vomiting   Doxycycline    Erythromycin Rash     REVIEW OF SYSTEMS (Negative unless checked)  Constitutional: [] Weight loss  [] Fever  [] Chills Cardiac: [] Chest pain   [] Chest pressure   [] Palpitations   [] Shortness of breath when laying flat   [x] Shortness of breath at rest   [x] Shortness of breath with exertion. Vascular:  [] Pain in legs with walking   [] Pain in legs at rest   [] Pain in legs when laying flat   [] Claudication   [] Pain in feet when walking  [] Pain in feet at rest  [] Pain in feet when laying flat   [] History of DVT   [] Phlebitis   [] Swelling in legs   [] Varicose veins   [] Non-healing ulcers Pulmonary:   [] Uses home oxygen   [] Productive cough   [] Hemoptysis   [] Wheeze  [] COPD   [] Asthma Neurologic:  [] Dizziness  [] Blackouts   [] Seizures   [] History of stroke   [] History of TIA  [] Aphasia   [] Temporary blindness   [] Dysphagia   [] Weakness or numbness in arms   [] Weakness or numbness in legs Musculoskeletal:  [] Arthritis   [] Joint swelling   [x] Joint pain   [] Low back pain Hematologic:  [] Easy bruising  [] Easy bleeding   [] Hypercoagulable state   [x] Anemic   Gastrointestinal:  [] Blood in stool   [] Vomiting blood  [] Gastroesophageal reflux/heartburn   [] Abdominal pain Genitourinary:  [x] Chronic kidney disease   [] Difficult urination  [] Frequent urination  [] Burning with urination   [] Hematuria Skin:  [] Rashes   [] Ulcers   [] Wounds Psychological:  [] History of anxiety   []  History of major depression.  Physical Examination  BP (!) 160/78 (BP Location: Right Arm)   Pulse 70   Resp 18   Ht 5\' 10"  (1.778 m)   Wt (!) 308 lb 9.6 oz (140 kg)   BMI  44.28 kg/m  Gen:  WD/WN, NAD.  Obese. Head: [place]/AT, No temporalis wasting. Ear/Nose/Throat: Hearing grossly intact, nares w/o erythema or drainage Eyes: Conjunctiva clear. Sclera non-icteric Neck: Supple.  Trachea midline Pulmonary:  Good air movement, no use of accessory muscles at rest.  Cardiac: RRR, no JVD Vascular:  Vessel Right Left  Radial Palpable Palpable               Musculoskeletal: M/S 5/5 throughout.  No deformity or atrophy.  Left radiocephalic AV fistula without bruit or thrill.  1+ bilateral lower extremity edema. Neurologic: Sensation grossly intact in extremities.  Symmetrical.  Speech is fluent.  Psychiatric: Judgment intact, Mood & affect appropriate for pt's clinical situation. Dermatologic: No rashes or ulcers noted.  No cellulitis or open wounds.      Labs Recent Results (from the past 2160 hour(s))  Potassium [facility] vascular lab only)     Status: None   Collection Time: 04/11/23  8:16 AM  Result Value Ref Range   Potassium [facility] vascular lab) 3.9 3.5 - 5.1 mmol/L    Comment: Performed at [facility], [address] [place], [place] [address]  Glucose, capillary     Status: Abnormal   Collection Time: 04/11/23  8:20 AM  Result Value Ref Range   Glucose-Capillary 261 (H) 70 - 99 mg/dL    Comment: Glucose reference range applies only to samples taken after fasting for at least 8 hours.  Glucose, capillary     Status: Abnormal   Collection Time: 04/11/23 10:25 AM  Result Value Ref Range   Glucose-Capillary 215 (H) 70 - 99 mg/dL    Comment: Glucose reference range applies only to samples taken after fasting for at least 8 hours.  Comprehensive metabolic panel     Status: Abnormal   Collection Time: 04/21/23  5:27 PM  Result Value Ref Range   Sodium 137 135 - 145 mmol/L   Potassium 3.5 3.5 - 5.1 mmol/L   Chloride 98 98 - 111 mmol/L   CO2 25 22 - 32 mmol/L   Glucose, Bld 186 (H) 70 - 99 mg/dL    Comment: Glucose reference range applies  only to samples taken after fasting for at least 8 hours.   BUN 21 8 - 23 mg/dL   Creatinine, Ser 6.04 (H) 0.61 - 1.24 mg/dL   Calcium 8.5 (L) 8.9 - 10.3 mg/dL   Total Protein 7.7 6.5 - 8.1 g/dL   Albumin 3.1 (L) 3.5 - 5.0 g/dL   AST 29 15 - 41 U/L   ALT 16 0 - 44 U/L   Alkaline Phosphatase 76 38 - 126 U/L   Total Bilirubin 0.2 (L) 0.3 - 1.2 mg/dL   GFR, Estimated 34 (L) >60 mL/min    Comment: (NOTE) Calculated using the CKD-EPI Creatinine Equation (2021)    Anion gap 14 5 - 15    Comment: Performed at [facility], [address] [place], [place] [address]  CBC with Differential     Status: Abnormal   Collection Time: 04/21/23  5:27 PM  Result Value Ref Range   WBC 12.8 (H) 4.0 - 10.5 K/uL   RBC 3.88 (L) 4.22 - 5.81 MIL/uL   Hemoglobin 11.2 (L) 13.0 - 17.0 g/dL   HCT 11.9 (L) 14.7 - 82.9 %   MCV 89.2 80.0 - 100.0 fL   MCH 28.9 26.0 - 34.0 pg   MCHC 32.4 30.0 - 36.0 g/dL   RDW 56.2 13.0 - 86.5 %  Platelets 329 150 - 400 K/uL   nRBC 0.0 0.0 - 0.2 %   Neutrophils Relative % 75 %   Neutro Abs 9.6 (H) 1.7 - 7.7 K/uL   Lymphocytes Relative 13 %   Lymphs Abs 1.7 0.7 - 4.0 K/uL   Monocytes Relative 7 %   Monocytes Absolute 1.0 0.1 - 1.0 K/uL   Eosinophils Relative 3 %   Eosinophils Absolute 0.4 0.0 - 0.5 K/uL   Basophils Relative 1 %   Basophils Absolute 0.1 0.0 - 0.1 K/uL   Immature Granulocytes 1 %   Abs Immature Granulocytes 0.11 (H) 0.00 - 0.07 K/uL    Comment: Performed at [facility], [address] [place], [place] [address]  Magnesium     Status: None   Collection Time: 04/21/23  5:27 PM  Result Value Ref Range   Magnesium 1.9 1.7 - 2.4 mg/dL    Comment: Performed at [facility], [address] [place], [place] [address]  CBG monitoring, ED     Status: Abnormal   Collection Time: 04/21/23  5:29 PM  Result Value Ref Range   Glucose-Capillary 201 (H) 70 - 99 mg/dL    Comment: Glucose reference range applies only to samples taken  after fasting for at least 8 hours.    Radiology PERIPHERAL VASCULAR CATHETERIZATION  Result Date: 04/11/2023 See surgical note for result.   Assessment/Plan  ESRD on dialysis [facility]) His PermCath placed 2 weeks ago was working well.  I believe a left brachiocephalic AV fistula will be his next permanent option.  However, with his current hypotension I would not recommend placing that currently as it will likely thrombose.  I would prefer to give him a couple of months of getting used to dialysis and hopefully his blood pressure will stabilize so that we can perform this with expectation of durability.  I will just plan to see him back in 6 to 8 weeks and follow-up to see how his blood pressure is doing and discuss left brachiocephalic AV fistula creation at that time.  Type 2 diabetes mellitus with renal manifestations (HCC) blood glucose control important in reducing the progression of atherosclerotic disease. Also, involved in wound healing. On appropriate medications.   Hyperlipemia lipid control important in reducing the progression of atherosclerotic disease. Continue statin therapy   Swelling of limb Some better with dialysis    Festus Barren, MD  04/29/2023 5:51 PM    This note was created with Dragon medical transcription system.  Any errors from dictation are purely unintentional

## 2023-04-29 NOTE — Assessment & Plan Note (Signed)
blood glucose control important in reducing the progression of atherosclerotic disease. Also, involved in wound healing. On appropriate medications.  

## 2023-05-06 ENCOUNTER — Ambulatory Visit (INDEPENDENT_AMBULATORY_CARE_PROVIDER_SITE_OTHER): Payer: PPO | Admitting: Vascular Surgery

## 2023-05-09 ENCOUNTER — Encounter (INDEPENDENT_AMBULATORY_CARE_PROVIDER_SITE_OTHER): Payer: PPO

## 2023-05-09 ENCOUNTER — Ambulatory Visit (INDEPENDENT_AMBULATORY_CARE_PROVIDER_SITE_OTHER): Payer: PPO | Admitting: Nurse Practitioner

## 2023-05-11 DIAGNOSIS — E1121 Type 2 diabetes mellitus with diabetic nephropathy: Secondary | ICD-10-CM | POA: Diagnosis not present

## 2023-05-13 DIAGNOSIS — E7800 Pure hypercholesterolemia, unspecified: Secondary | ICD-10-CM | POA: Diagnosis not present

## 2023-05-13 DIAGNOSIS — M25511 Pain in right shoulder: Secondary | ICD-10-CM | POA: Diagnosis not present

## 2023-05-13 DIAGNOSIS — E1122 Type 2 diabetes mellitus with diabetic chronic kidney disease: Secondary | ICD-10-CM | POA: Diagnosis not present

## 2023-05-13 DIAGNOSIS — F419 Anxiety disorder, unspecified: Secondary | ICD-10-CM | POA: Diagnosis not present

## 2023-05-13 DIAGNOSIS — J309 Allergic rhinitis, unspecified: Secondary | ICD-10-CM | POA: Diagnosis not present

## 2023-05-13 DIAGNOSIS — F321 Major depressive disorder, single episode, moderate: Secondary | ICD-10-CM | POA: Diagnosis not present

## 2023-05-13 DIAGNOSIS — E1140 Type 2 diabetes mellitus with diabetic neuropathy, unspecified: Secondary | ICD-10-CM | POA: Diagnosis not present

## 2023-05-13 DIAGNOSIS — I10 Essential (primary) hypertension: Secondary | ICD-10-CM | POA: Diagnosis not present

## 2023-05-13 DIAGNOSIS — N2581 Secondary hyperparathyroidism of renal origin: Secondary | ICD-10-CM | POA: Diagnosis not present

## 2023-05-13 DIAGNOSIS — I509 Heart failure, unspecified: Secondary | ICD-10-CM | POA: Diagnosis not present

## 2023-05-13 DIAGNOSIS — I700 Atherosclerosis of aorta: Secondary | ICD-10-CM | POA: Diagnosis not present

## 2023-05-20 DIAGNOSIS — E7800 Pure hypercholesterolemia, unspecified: Secondary | ICD-10-CM | POA: Diagnosis not present

## 2023-05-21 ENCOUNTER — Other Ambulatory Visit: Payer: Self-pay | Admitting: Student

## 2023-05-21 DIAGNOSIS — I2510 Atherosclerotic heart disease of native coronary artery without angina pectoris: Secondary | ICD-10-CM

## 2023-05-21 DIAGNOSIS — G4733 Obstructive sleep apnea (adult) (pediatric): Secondary | ICD-10-CM | POA: Diagnosis not present

## 2023-05-22 DIAGNOSIS — N186 End stage renal disease: Secondary | ICD-10-CM | POA: Diagnosis not present

## 2023-05-22 DIAGNOSIS — Z992 Dependence on renal dialysis: Secondary | ICD-10-CM | POA: Diagnosis not present

## 2023-05-23 DIAGNOSIS — N186 End stage renal disease: Secondary | ICD-10-CM | POA: Diagnosis not present

## 2023-05-23 DIAGNOSIS — D509 Iron deficiency anemia, unspecified: Secondary | ICD-10-CM | POA: Diagnosis not present

## 2023-05-23 DIAGNOSIS — Z992 Dependence on renal dialysis: Secondary | ICD-10-CM | POA: Diagnosis not present

## 2023-05-26 ENCOUNTER — Other Ambulatory Visit: Payer: Self-pay

## 2023-05-26 ENCOUNTER — Inpatient Hospital Stay (HOSPITAL_COMMUNITY)
Admit: 2023-05-26 | Discharge: 2023-05-26 | Disposition: A | Discharge status: Home / Self Care | Payer: PPO | Attending: Internal Medicine | Admitting: Internal Medicine

## 2023-05-26 ENCOUNTER — Emergency Department: Payer: PPO

## 2023-05-26 ENCOUNTER — Inpatient Hospital Stay
Admission: EM | Admit: 2023-05-26 | Discharge: 2023-05-29 | DRG: 291 | Disposition: A | Discharge status: Home / Self Care | Payer: PPO | Attending: Internal Medicine | Admitting: Internal Medicine

## 2023-05-26 ENCOUNTER — Encounter: Payer: Self-pay | Admitting: Internal Medicine

## 2023-05-26 DIAGNOSIS — Z881 Allergy status to other antibiotic agents status: Secondary | ICD-10-CM

## 2023-05-26 DIAGNOSIS — I2510 Atherosclerotic heart disease of native coronary artery without angina pectoris: Secondary | ICD-10-CM | POA: Diagnosis not present

## 2023-05-26 DIAGNOSIS — D631 Anemia in chronic kidney disease: Secondary | ICD-10-CM | POA: Diagnosis present

## 2023-05-26 DIAGNOSIS — I5033 Acute on chronic diastolic (congestive) heart failure: Secondary | ICD-10-CM | POA: Diagnosis present

## 2023-05-26 DIAGNOSIS — Z794 Long term (current) use of insulin: Secondary | ICD-10-CM | POA: Diagnosis not present

## 2023-05-26 DIAGNOSIS — E1140 Type 2 diabetes mellitus with diabetic neuropathy, unspecified: Secondary | ICD-10-CM | POA: Diagnosis present

## 2023-05-26 DIAGNOSIS — Z85828 Personal history of other malignant neoplasm of skin: Secondary | ICD-10-CM

## 2023-05-26 DIAGNOSIS — I953 Hypotension of hemodialysis: Secondary | ICD-10-CM | POA: Diagnosis present

## 2023-05-26 DIAGNOSIS — D72828 Other elevated white blood cell count: Secondary | ICD-10-CM | POA: Diagnosis present

## 2023-05-26 DIAGNOSIS — E1165 Type 2 diabetes mellitus with hyperglycemia: Secondary | ICD-10-CM | POA: Diagnosis present

## 2023-05-26 DIAGNOSIS — E785 Hyperlipidemia, unspecified: Secondary | ICD-10-CM | POA: Diagnosis present

## 2023-05-26 DIAGNOSIS — I161 Hypertensive emergency: Secondary | ICD-10-CM | POA: Diagnosis present

## 2023-05-26 DIAGNOSIS — I509 Heart failure, unspecified: Secondary | ICD-10-CM | POA: Diagnosis not present

## 2023-05-26 DIAGNOSIS — Z7982 Long term (current) use of aspirin: Secondary | ICD-10-CM

## 2023-05-26 DIAGNOSIS — I110 Hypertensive heart disease with heart failure: Secondary | ICD-10-CM | POA: Diagnosis not present

## 2023-05-26 DIAGNOSIS — N184 Chronic kidney disease, stage 4 (severe): Secondary | ICD-10-CM | POA: Diagnosis present

## 2023-05-26 DIAGNOSIS — N186 End stage renal disease: Secondary | ICD-10-CM | POA: Diagnosis present

## 2023-05-26 DIAGNOSIS — R079 Chest pain, unspecified: Secondary | ICD-10-CM | POA: Diagnosis not present

## 2023-05-26 DIAGNOSIS — N2581 Secondary hyperparathyroidism of renal origin: Secondary | ICD-10-CM | POA: Diagnosis present

## 2023-05-26 DIAGNOSIS — I132 Hypertensive heart and chronic kidney disease with heart failure and with stage 5 chronic kidney disease, or end stage renal disease: Secondary | ICD-10-CM | POA: Diagnosis not present

## 2023-05-26 DIAGNOSIS — N400 Enlarged prostate without lower urinary tract symptoms: Secondary | ICD-10-CM | POA: Diagnosis present

## 2023-05-26 DIAGNOSIS — Z6841 Body Mass Index (BMI) 40.0 and over, adult: Secondary | ICD-10-CM | POA: Diagnosis not present

## 2023-05-26 DIAGNOSIS — Z79899 Other long term (current) drug therapy: Secondary | ICD-10-CM

## 2023-05-26 DIAGNOSIS — J9811 Atelectasis: Secondary | ICD-10-CM | POA: Diagnosis not present

## 2023-05-26 DIAGNOSIS — E88819 Insulin resistance, unspecified: Secondary | ICD-10-CM | POA: Diagnosis not present

## 2023-05-26 DIAGNOSIS — I472 Ventricular tachycardia, unspecified: Secondary | ICD-10-CM | POA: Diagnosis not present

## 2023-05-26 DIAGNOSIS — Z992 Dependence on renal dialysis: Secondary | ICD-10-CM | POA: Diagnosis not present

## 2023-05-26 DIAGNOSIS — Z66 Do not resuscitate: Secondary | ICD-10-CM | POA: Diagnosis present

## 2023-05-26 DIAGNOSIS — E1122 Type 2 diabetes mellitus with diabetic chronic kidney disease: Secondary | ICD-10-CM | POA: Diagnosis present

## 2023-05-26 DIAGNOSIS — Z885 Allergy status to narcotic agent status: Secondary | ICD-10-CM

## 2023-05-26 DIAGNOSIS — Z825 Family history of asthma and other chronic lower respiratory diseases: Secondary | ICD-10-CM

## 2023-05-26 DIAGNOSIS — I214 Non-ST elevation (NSTEMI) myocardial infarction: Secondary | ICD-10-CM | POA: Diagnosis not present

## 2023-05-26 DIAGNOSIS — Z8249 Family history of ischemic heart disease and other diseases of the circulatory system: Secondary | ICD-10-CM

## 2023-05-26 DIAGNOSIS — I5023 Acute on chronic systolic (congestive) heart failure: Secondary | ICD-10-CM | POA: Diagnosis not present

## 2023-05-26 DIAGNOSIS — Z452 Encounter for adjustment and management of vascular access device: Secondary | ICD-10-CM | POA: Diagnosis not present

## 2023-05-26 DIAGNOSIS — R0602 Shortness of breath: Secondary | ICD-10-CM | POA: Diagnosis not present

## 2023-05-26 DIAGNOSIS — Z9049 Acquired absence of other specified parts of digestive tract: Secondary | ICD-10-CM

## 2023-05-26 DIAGNOSIS — I2489 Other forms of acute ischemic heart disease: Secondary | ICD-10-CM | POA: Diagnosis present

## 2023-05-26 DIAGNOSIS — I5021 Acute systolic (congestive) heart failure: Secondary | ICD-10-CM | POA: Diagnosis not present

## 2023-05-26 DIAGNOSIS — R918 Other nonspecific abnormal finding of lung field: Secondary | ICD-10-CM | POA: Diagnosis not present

## 2023-05-26 DIAGNOSIS — R0989 Other specified symptoms and signs involving the circulatory and respiratory systems: Secondary | ICD-10-CM | POA: Diagnosis not present

## 2023-05-26 DIAGNOSIS — Z808 Family history of malignant neoplasm of other organs or systems: Secondary | ICD-10-CM

## 2023-05-26 DIAGNOSIS — Z87442 Personal history of urinary calculi: Secondary | ICD-10-CM

## 2023-05-26 DIAGNOSIS — R42 Dizziness and giddiness: Secondary | ICD-10-CM | POA: Diagnosis not present

## 2023-05-26 DIAGNOSIS — Z8616 Personal history of COVID-19: Secondary | ICD-10-CM | POA: Diagnosis not present

## 2023-05-26 DIAGNOSIS — I10 Essential (primary) hypertension: Secondary | ICD-10-CM | POA: Diagnosis not present

## 2023-05-26 LAB — ECHOCARDIOGRAM COMPLETE
AR max vel: 2.4 cm2
AV Area VTI: 1.95 cm2
AV Area mean vel: 2.78 cm2
AV Mean grad: 3 mm[Hg]
AV Peak grad: 8.6 mm[Hg]
Ao pk vel: 1.47 m/s
Area-P 1/2: 2.91 cm2
Height: 70 in
MV VTI: 2.65 cm2
S' Lateral: 5.2 cm
Weight: 4924.8 [oz_av]

## 2023-05-26 LAB — COMPREHENSIVE METABOLIC PANEL
ALT: 18 U/L (ref 0–44)
AST: 20 U/L (ref 15–41)
Albumin: 2.9 g/dL — ABNORMAL LOW (ref 3.5–5.0)
Alkaline Phosphatase: 74 U/L (ref 38–126)
Anion gap: 12 (ref 5–15)
BUN: 47 mg/dL — ABNORMAL HIGH (ref 8–23)
CO2: 25 mmol/L (ref 22–32)
Calcium: 8.3 mg/dL — ABNORMAL LOW (ref 8.9–10.3)
Chloride: 100 mmol/L (ref 98–111)
Creatinine, Ser: 3.35 mg/dL — ABNORMAL HIGH (ref 0.61–1.24)
GFR, Estimated: 18 mL/min — ABNORMAL LOW (ref >60)
Glucose, Bld: 220 mg/dL — ABNORMAL HIGH (ref 70–99)
Potassium: 3.8 mmol/L (ref 3.5–5.1)
Sodium: 137 mmol/L (ref 135–145)
Total Bilirubin: 0.5 mg/dL (ref <1.2)
Total Protein: 6.7 g/dL (ref 6.5–8.1)

## 2023-05-26 LAB — CBC WITH DIFFERENTIAL/PLATELET
Abs Immature Granulocytes: 0.09 10*3/uL — ABNORMAL HIGH (ref 0.00–0.07)
Basophils Absolute: 0.1 10*3/uL (ref 0.0–0.1)
Basophils Relative: 1 %
Eosinophils Absolute: 0.3 10*3/uL (ref 0.0–0.5)
Eosinophils Relative: 2 %
HCT: 33.4 % — ABNORMAL LOW (ref 39.0–52.0)
Hemoglobin: 10.9 g/dL — ABNORMAL LOW (ref 13.0–17.0)
Immature Granulocytes: 1 %
Lymphocytes Relative: 11 %
Lymphs Abs: 1.4 10*3/uL (ref 0.7–4.0)
MCH: 29.9 pg (ref 26.0–34.0)
MCHC: 32.6 g/dL (ref 30.0–36.0)
MCV: 91.8 fL (ref 80.0–100.0)
Monocytes Absolute: 0.9 10*3/uL (ref 0.1–1.0)
Monocytes Relative: 7 %
Neutro Abs: 10.3 10*3/uL — ABNORMAL HIGH (ref 1.7–7.7)
Neutrophils Relative %: 78 %
Platelets: 294 10*3/uL (ref 150–400)
RBC: 3.64 MIL/uL — ABNORMAL LOW (ref 4.22–5.81)
RDW: 14.7 % (ref 11.5–15.5)
WBC: 13.1 10*3/uL — ABNORMAL HIGH (ref 4.0–10.5)
nRBC: 0 % (ref 0.0–0.2)

## 2023-05-26 LAB — TROPONIN I (HIGH SENSITIVITY)
Troponin I (High Sensitivity): 316 ng/L (ref <18)
Troponin I (High Sensitivity): 353 ng/L (ref <18)
Troponin I (High Sensitivity): 462 ng/L (ref <18)

## 2023-05-26 LAB — GLUCOSE, CAPILLARY
Glucose-Capillary: 208 mg/dL — ABNORMAL HIGH (ref 70–99)
Glucose-Capillary: 197 mg/dL — ABNORMAL HIGH (ref 70–99)
Glucose-Capillary: 204 mg/dL — ABNORMAL HIGH (ref 70–99)

## 2023-05-26 LAB — HEMOGLOBIN A1C
Hgb A1c MFr Bld: 7.6 % — ABNORMAL HIGH (ref 4.8–5.6)
Mean Plasma Glucose: 171.42 mg/dL

## 2023-05-26 LAB — APTT: aPTT: 32 s (ref 24–36)

## 2023-05-26 LAB — LIPASE, BLOOD: Lipase: 41 U/L (ref 11–51)

## 2023-05-26 LAB — MRSA NEXT GEN BY PCR, NASAL: MRSA by PCR Next Gen: DETECTED — AB

## 2023-05-26 LAB — PROTIME-INR
INR: 1 (ref 0.8–1.2)
Prothrombin Time: 13.7 s (ref 11.4–15.2)

## 2023-05-26 LAB — HEPARIN LEVEL (UNFRACTIONATED): Heparin Unfractionated: 0.19 [IU]/mL — ABNORMAL LOW (ref 0.30–0.70)

## 2023-05-26 LAB — BRAIN NATRIURETIC PEPTIDE: B Natriuretic Peptide: 1047.2 pg/mL — ABNORMAL HIGH (ref 0.0–100.0)

## 2023-05-26 MED ORDER — CHLORHEXIDINE GLUCONATE CLOTH 2 % EX PADS
6.0000 | MEDICATED_PAD | Freq: Every day | CUTANEOUS | Status: DC
  Administered 2023-05-27 – 2023-05-29 (×3): 6 via TOPICAL

## 2023-05-26 MED ORDER — SODIUM CHLORIDE 0.9 % IV SOLN: 250.0000 mL | INTRAVENOUS | Status: AC | PRN

## 2023-05-26 MED ORDER — HYDRALAZINE HCL 20 MG/ML IJ SOLN: 5.0000 mg | Freq: Four times a day (QID) | INTRAMUSCULAR | Status: DC | PRN

## 2023-05-26 MED ORDER — POTASSIUM CITRATE ER 10 MEQ (1080 MG) PO TBCR
20.0000 meq | EXTENDED_RELEASE_TABLET | Freq: Two times a day (BID) | ORAL | Status: DC
  Administered 2023-05-26 – 2023-05-29 (×7): 20 meq via ORAL
  Filled 2023-05-26 (×9): qty 2

## 2023-05-26 MED ORDER — NITROGLYCERIN 0.4 MG SL SUBL: 0.4000 mg | SUBLINGUAL_TABLET | SUBLINGUAL | Status: DC | PRN

## 2023-05-26 MED ORDER — FUROSEMIDE 40 MG PO TABS
80.0000 mg | ORAL_TABLET | Freq: Two times a day (BID) | ORAL | Status: DC
  Administered 2023-05-27 – 2023-05-29 (×5): 80 mg via ORAL
  Filled 2023-05-26 (×5): qty 2

## 2023-05-26 MED ORDER — ALBUTEROL SULFATE (2.5 MG/3ML) 0.083% IN NEBU: 3.0000 mL | INHALATION_SOLUTION | Freq: Four times a day (QID) | RESPIRATORY_TRACT | Status: DC | PRN

## 2023-05-26 MED ORDER — SODIUM CHLORIDE 0.9% FLUSH
3.0000 mL | Freq: Two times a day (BID) | INTRAVENOUS | Status: DC
Start: 2023-05-26 — End: 2023-05-29
  Administered 2023-05-26 – 2023-05-29 (×6): 3 mL via INTRAVENOUS

## 2023-05-26 MED ORDER — SERTRALINE HCL 50 MG PO TABS
50.0000 mg | ORAL_TABLET | Freq: Every day | ORAL | Status: DC
  Administered 2023-05-26 – 2023-05-29 (×4): 50 mg via ORAL
  Filled 2023-05-26 (×4): qty 1

## 2023-05-26 MED ORDER — ACETAMINOPHEN 325 MG PO TABS
650.0000 mg | ORAL_TABLET | ORAL | Status: DC | PRN
  Administered 2023-05-26 (×2): 650 mg via ORAL
  Filled 2023-05-26 (×2): qty 2

## 2023-05-26 MED ORDER — ASPIRIN 81 MG PO TBEC
81.0000 mg | DELAYED_RELEASE_TABLET | Freq: Every day | ORAL | Status: DC
  Administered 2023-05-27 – 2023-05-29 (×3): 81 mg via ORAL
  Filled 2023-05-26 (×3): qty 1

## 2023-05-26 MED ORDER — INSULIN ASPART 100 UNIT/ML IJ SOLN
0.0000 [IU] | Freq: Three times a day (TID) | INTRAMUSCULAR | Status: DC
Start: 2023-05-26 — End: 2023-05-29
  Administered 2023-05-26: 3 [IU] via SUBCUTANEOUS
  Administered 2023-05-26: 2 [IU] via SUBCUTANEOUS
  Administered 2023-05-27 (×2): 1 [IU] via SUBCUTANEOUS
  Administered 2023-05-28: 2 [IU] via SUBCUTANEOUS
  Administered 2023-05-28: 1 [IU] via SUBCUTANEOUS
  Administered 2023-05-28: 5 [IU] via SUBCUTANEOUS
  Administered 2023-05-29: 9 [IU] via SUBCUTANEOUS
  Administered 2023-05-29: 3 [IU] via SUBCUTANEOUS
  Filled 2023-05-26 (×5): qty 1

## 2023-05-26 MED ORDER — INSULIN DETEMIR 100 UNIT/ML ~~LOC~~ SOLN
42.0000 [IU] | Freq: Every day | SUBCUTANEOUS | Status: DC
  Administered 2023-05-26 – 2023-05-28 (×3): 42 [IU] via SUBCUTANEOUS
  Filled 2023-05-26 (×5): qty 0.42

## 2023-05-26 MED ORDER — SODIUM CHLORIDE 0.9% FLUSH: 3.0000 mL | INTRAVENOUS | Status: DC | PRN

## 2023-05-26 MED ORDER — LINAGLIPTIN 5 MG PO TABS
5.0000 mg | ORAL_TABLET | Freq: Every day | ORAL | Status: DC
  Administered 2023-05-26 – 2023-05-29 (×4): 5 mg via ORAL
  Filled 2023-05-26 (×5): qty 1

## 2023-05-26 MED ORDER — PERFLUTREN LIPID MICROSPHERE
1.0000 mL | INTRAVENOUS | Status: AC | PRN
  Administered 2023-05-26: 6 mL via INTRAVENOUS

## 2023-05-26 MED ORDER — HEPARIN BOLUS VIA INFUSION
4000.0000 [IU] | Freq: Once | INTRAVENOUS | Status: AC
  Administered 2023-05-26: 4000 [IU] via INTRAVENOUS
  Filled 2023-05-26: qty 4000

## 2023-05-26 MED ORDER — ONDANSETRON HCL 4 MG/2ML IJ SOLN: 4.0000 mg | Freq: Four times a day (QID) | INTRAMUSCULAR | Status: DC | PRN

## 2023-05-26 MED ORDER — PRAVASTATIN SODIUM 40 MG PO TABS
40.0000 mg | ORAL_TABLET | Freq: Every day | ORAL | Status: DC
  Administered 2023-05-26 – 2023-05-28 (×3): 40 mg via ORAL
  Filled 2023-05-26 (×3): qty 1

## 2023-05-26 MED ORDER — SPIRONOLACTONE 25 MG PO TABS: 50.0000 mg | ORAL_TABLET | Freq: Every day | ORAL | Status: DC

## 2023-05-26 MED ORDER — LORATADINE 10 MG PO TABS
10.0000 mg | ORAL_TABLET | Freq: Every day | ORAL | Status: DC
  Administered 2023-05-26 – 2023-05-29 (×4): 10 mg via ORAL
  Filled 2023-05-26 (×4): qty 1

## 2023-05-26 MED ORDER — FLUTICASONE PROPIONATE 50 MCG/ACT NA SUSP
1.0000 | Freq: Every day | NASAL | Status: DC
  Administered 2023-05-26 – 2023-05-29 (×4): 1 via NASAL
  Filled 2023-05-26 (×2): qty 16

## 2023-05-26 MED ORDER — ACETAMINOPHEN 500 MG PO TABS: 1000.0000 mg | ORAL_TABLET | Freq: Four times a day (QID) | ORAL | Status: DC | PRN

## 2023-05-26 MED ORDER — HEPARIN (PORCINE) 25000 UT/250ML-% IV SOLN
1750.0000 [IU]/h | INTRAVENOUS | Status: DC
  Administered 2023-05-26: 1750 [IU]/h via INTRAVENOUS
  Administered 2023-05-26: 1400 [IU]/h via INTRAVENOUS
  Filled 2023-05-26 (×2): qty 250

## 2023-05-26 MED ORDER — INSULIN ASPART 100 UNIT/ML IJ SOLN
14.0000 [IU] | Freq: Three times a day (TID) | INTRAMUSCULAR | Status: DC
Start: 2023-05-26 — End: 2023-05-29
  Administered 2023-05-26 – 2023-05-29 (×9): 14 [IU] via SUBCUTANEOUS
  Filled 2023-05-26 (×8): qty 1

## 2023-05-26 MED ORDER — ISOSORBIDE MONONITRATE ER 30 MG PO TB24
30.0000 mg | ORAL_TABLET | Freq: Every day | ORAL | Status: DC
  Administered 2023-05-26 – 2023-05-29 (×4): 30 mg via ORAL
  Filled 2023-05-26 (×4): qty 1

## 2023-05-26 MED ORDER — DIAZEPAM 2 MG PO TABS: 2.0000 mg | ORAL_TABLET | Freq: Two times a day (BID) | ORAL | Status: DC | PRN

## 2023-05-26 MED ORDER — HEPARIN BOLUS VIA INFUSION
3000.0000 [IU] | Freq: Once | INTRAVENOUS | Status: AC
  Administered 2023-05-26: 3000 [IU] via INTRAVENOUS
  Filled 2023-05-26: qty 3000

## 2023-05-26 MED ORDER — TAMSULOSIN HCL 0.4 MG PO CAPS
0.4000 mg | ORAL_CAPSULE | Freq: Every day | ORAL | Status: DC
  Administered 2023-05-26 – 2023-05-28 (×3): 0.4 mg via ORAL
  Filled 2023-05-26 (×3): qty 1

## 2023-05-26 MED ORDER — METOPROLOL SUCCINATE ER 50 MG PO TB24
50.0000 mg | ORAL_TABLET | Freq: Every day | ORAL | Status: DC
  Administered 2023-05-26 – 2023-05-27 (×2): 50 mg via ORAL
  Filled 2023-05-26 (×2): qty 1

## 2023-05-26 MED ORDER — FUROSEMIDE 10 MG/ML IJ SOLN
80.0000 mg | Freq: Once | INTRAMUSCULAR | Status: AC
  Administered 2023-05-26: 80 mg via INTRAVENOUS
  Filled 2023-05-26: qty 8

## 2023-05-26 NOTE — Progress Notes (Signed)
PHARMACY - ANTICOAGULATION CONSULT NOTE  Pharmacy Consult for Heparin Drip Indication: chest pain/ACS  Patient Measurements: Height: 5\' 10"  (177.8 cm) Weight: (!) 139.6 kg (307 lb 12.8 oz) IBW/kg (Calculated) : 73 Heparin Dosing Weight: 105.8 kg  Labs: Recent Labs    05/26/23 0749 05/26/23 0807 05/26/23 0950 05/26/23 1525 05/26/23 1826  HGB 10.9*  --   --   --   --   HCT 33.4*  --   --   --   --   PLT 294  --   --   --   --   APTT  --  32  --   --   --   LABPROT  --  13.7  --   --   --   INR  --  1.0  --   --   --   HEPARINUNFRC  --   --   --   --  0.19*  CREATININE 3.35*  --   --   --   --   TROPONINIHS 316*  --  353* 462*  --     Estimated Creatinine Clearance: 24 mL/min (A) (by C-G formula based on SCr of 3.35 mg/dL (H)).  Medical History: Past Medical History:  Diagnosis Date   (HFpEF) heart failure with preserved ejection fraction (HCC)    a.) TTE 11/26/2021: EF 45%, mod LVH, post HK, mod LAE, mild MR/TR, G2DD; b.) TTE 02/26/2022: EF 55-60%, BAE, triv MR, G2DD; c.) TTE 07/09/2022: EF 45%, post HK, LVH, mod LAE, triv TR/PR, mild MR, G1DD   Anemia of chronic renal failure    Aortic atherosclerosis (HCC)    Asthma    Bell's palsy    BPH (benign prostatic hyperplasia)    CAD (coronary artery disease)    a.) LHC 12/31/2021: 60% mLAD, 20% pRCA, 20% dRCA, 20% o-pLCx --> med mgmt.   CKD (chronic kidney disease), stage IV (HCC)    Diabetic neuropathy (HCC)    Dyspnea on exertion    Ganglion cyst of wrist, right    a.) s/p excision 04/2011   Hepatic steatosis    History of 2019 novel coronavirus disease (COVID-19)    History of bilateral cataract extraction 2018   Hyperlipidemia    Hyperparathyroidism due to renal insufficiency (HCC)    Hypertension    Low testosterone in male    Nephrolithiasis    Obesity    OSA treated with BiPAP    Osteoarthritis    Peripheral edema    Right inguinal hernia    a.) s/p repair   Skin cancer    Type 2 diabetes mellitus with  renal manifestations [facility])     Assessment: 82 yo M to start Heparin drip for chest pain, ACS/STEMI. Patient on ASA PTA per Med Rec Hgb 10.9  Plt 294  aPTT 32   INR 1.0 Hemodialysis patient  1104 1826 HL 0.19, subthera; 1400 un/hr  Goal of Therapy:  Heparin level 0.3-0.7 units/ml Monitor platelets by anticoagulation protocol: Yes   Plan:  --Heparin level is subtherapeutic --Heparin 3000 unit IV bolus and increase heparin infusion to 1750 units/hr --Re-check HL 8 hours from rate change --Daily CBC per protocol  Tressie Ellis 05/26/2023

## 2023-05-26 NOTE — Progress Notes (Signed)
*  PRELIMINARY RESULTS* Echocardiogram 2D Echocardiogram has been performed.  James Clarke 05/26/2023, 2:47 PM

## 2023-05-26 NOTE — H&P (Addendum)
History and Physical    James Clarke NGE:[id number] DOB: 1940-10-08 DOA: 05/26/2023  PCP: Marina Goodell, MD (Confirm with patient/family/NH records and if not entered, this has to be entered at [facility] point of entry) Patient coming from: Home  I have personally briefly reviewed patient's old medical records in [facility] Health Link  Chief Complaint: Chest pain, SOB  HPI: James Clarke is a 82 y.o. male with medical history significant of CKD stage V with recently started HD, chronic HFrEF with LVEF 45% 2023, refractory HTN, nonobstructive CAD, IDDM with diabetic neuropathy, morbid obesity, BPH, presented worsening of shortness of breath and new onset of chest pain.  Patient recently started hemodialysis MWF about 4 weeks ago.  At baseline patient used to have history of poorly controlled hypertension, the initial 2 weeks of dialysis patient experienced much improved blood pressure control.  However started about 2 weeks ago, patient started to have hypotension associated with dialysis and he was symptomatic with lightheadedness at the end of dialysis.  Last week his BP medication were adjusted to address the hypotension associated with HD, metoprolol dosage was cut down to half and amlodipine discontinued but only for dialysis days.  Despite the above-mentioned changes patient started have shortness of breath during the latter half of dialysis and has been recurrent persistently during dialysis for last 3 times.  Last night, patient started to have chest pains pressure-like 6-7/10, centrally located associated with increasing shortness of breath which woke him up around 3 AM.  Took 1 nitroglycerin and aspirin with no significant improvement.  Denied any cough no peripheral swelling.  He weighed himself every day and found his weight has been Invirase within 1 pound day today.  ED Course: Afebrile, blood pressure 160/80, O2 saturation 97% on room air.  Blood work showed creatinine 3.3 above his  baseline glucose 220, WBC 13, hemoglobin 10.9, VBG 7.3 9/49/31.  Chest x-ray showing pulmonary congestion.  Troponin first at 300  EKG showed sinus rhythm, chronic ST changes on multiple leads.  Patient was started on heparin drip and Lasix 80 mg x 1 in the ED.  Review of Systems: As per HPI otherwise 14 point review of systems negative.    Past Medical History:  Diagnosis Date   (HFpEF) heart failure with preserved ejection fraction (HCC)    a.) TTE 11/26/2021: EF 45%, mod LVH, post HK, mod LAE, mild MR/TR, G2DD; b.) TTE 02/26/2022: EF 55-60%, BAE, triv MR, G2DD; c.) TTE 07/09/2022: EF 45%, post HK, LVH, mod LAE, triv TR/PR, mild MR, G1DD   Anemia of chronic renal failure    Aortic atherosclerosis (HCC)    Asthma    Bell's palsy    BPH (benign prostatic hyperplasia)    CAD (coronary artery disease)    a.) LHC 12/31/2021: 60% mLAD, 20% pRCA, 20% dRCA, 20% o-pLCx --> med mgmt.   CKD (chronic kidney disease), stage IV (HCC)    Diabetic neuropathy (HCC)    Dyspnea on exertion    Ganglion cyst of wrist, right    a.) s/p excision 04/2011   Hepatic steatosis    History of 2019 novel coronavirus disease (COVID-19)    History of bilateral cataract extraction 2018   Hyperlipidemia    Hyperparathyroidism due to renal insufficiency (HCC)    Hypertension    Low testosterone in male    Nephrolithiasis    Obesity    OSA treated with BiPAP    Osteoarthritis    Peripheral edema    Right  inguinal hernia    a.) s/p repair   Skin cancer    Type 2 diabetes mellitus with renal manifestations [facility])     Past Surgical History:  Procedure Laterality Date   A/V FISTULAGRAM Left 01/13/2023   Procedure: A/V Fistulagram;  Surgeon: Annice Needy, MD;  Location: [facility] INVASIVE CV LAB;  Service: Cardiovascular;  Laterality: Left;   A/V SHUNT INTERVENTION Left 04/11/2023   Procedure: A/V SHUNT INTERVENTION;  Surgeon: Annice Needy, MD;  Location: [facility] INVASIVE CV LAB;  Service: Cardiovascular;  Laterality:  Left;   APPENDECTOMY     AV FISTULA PLACEMENT Left 09/19/2022   Procedure: ARTERIOVENOUS (AV) FISTULA CREATION (RADIO CEPHALIC);  Surgeon: Annice Needy, MD;  Location: [facility] ORS;  Service: Vascular;  Laterality: Left;   CATARACT EXTRACTION W/PHACO Left 05/20/2017   Procedure: CATARACT EXTRACTION PHACO AND INTRAOCULAR LENS PLACEMENT (IOC);  Surgeon: Galen Manila, MD;  Location: [facility] ORS;  Service: Ophthalmology;  Laterality: Left;  [place] 00:32.0 AP% 15.5 CDE 4.97 Fluid Pack Lot # [id number]   CATARACT EXTRACTION W/PHACO Right 06/10/2017   Procedure: CATARACT EXTRACTION PHACO AND INTRAOCULAR LENS PLACEMENT (IOC);  Surgeon: Galen Manila, MD;  Location: [facility] ORS;  Service: Ophthalmology;  Laterality: Right;  [place]  00:51 AP% 15.4 CDE 7.95 Fluid pack lot # [id number]   CHOLECYSTECTOMY     COLONOSCOPY     x3   CYSTOSCOPY W/ RETROGRADES Right 08/13/2019   Procedure: CYSTOSCOPY WITH RETROGRADE PYELOGRAM;  Surgeon: Sondra Come, MD;  Location: [facility] ORS;  Service: Urology;  Laterality: Right;   CYSTOSCOPY WITH STENT PLACEMENT Right 07/26/2019   Procedure: CYSTOSCOPY WITH STENT PLACEMENT;  Surgeon: Crista Elliot, MD;  Location: [facility] ORS;  Service: Urology;  Laterality: Right;   CYSTOSCOPY/URETEROSCOPY/HOLMIUM LASER/STENT PLACEMENT Right 08/13/2019   Procedure: CYSTOSCOPY/URETEROSCOPY/HOLMIUM LASER/STENT EXCHANGE;  Surgeon: Sondra Come, MD;  Location: [facility] ORS;  Service: Urology;  Laterality: Right;   CYSTOSCOPY/URETEROSCOPY/HOLMIUM LASER/STENT PLACEMENT Left 02/27/2022   Procedure: CYSTOSCOPY/URETEROSCOPY/HOLMIUM LASER/STENT PLACEMENT;  Surgeon: Riki Altes, MD;  Location: [facility] ORS;  Service: Urology;  Laterality: Left;   DIALYSIS/PERMA CATHETER INSERTION N/A 04/11/2023   Procedure: DIALYSIS/PERMA CATHETER INSERTION;  Surgeon: Annice Needy, MD;  Location: [facility] INVASIVE CV LAB;  Service: Cardiovascular;  Laterality: N/A;   GANGLION CYST EXCISION Right    INGUINAL HERNIA REPAIR Right     LEFT HEART CATH AND CORONARY ANGIOGRAPHY N/A 12/31/2021   Procedure: LEFT HEART CATH AND CORONARY ANGIOGRAPHY;  Surgeon: Lamar Blinks, MD;  Location: [facility] INVASIVE CV LAB;  Service: Cardiovascular;  Laterality: N/A;   SHOULDER ARTHROSCOPY WITH SUBACROMIAL DECOMPRESSION AND OPEN ROTATOR C Right 09/11/2020   Procedure: Right shoulder arthroscopic rotator cuff repair vs Regeneten patch application, subacromial decompression, and biceps tenodesis - Dedra Skeens to Assist;  Surgeon: Signa Kell, MD;  Location: [facility] ORS;  Service: Orthopedics;  Laterality: Right;   TEMPORARY DIALYSIS CATHETER N/A 03/05/2022   Procedure: TEMPORARY DIALYSIS CATHETER;  Surgeon: Renford Dills, MD;  Location: [facility] INVASIVE CV LAB;  Service: Cardiovascular;  Laterality: N/A;   TONSILLECTOMY     URETEROSCOPY WITH HOLMIUM LASER LITHOTRIPSY Left 02/27/2022   Procedure: URETEROSCOPY WITH HOLMIUM LASER LITHOTRIPSY;  Surgeon: Riki Altes, MD;  Location: [facility] ORS;  Service: Urology;  Laterality: Left;   WRIST FRACTURE SURGERY Right      reports that he has never smoked. He has never been exposed to tobacco smoke. He has never used smokeless tobacco. He reports that he does not currently use drugs. He  reports that he does not drink alcohol.  Allergies  Allergen Reactions   Codeine Nausea And Vomiting   Doxycycline    Erythromycin Rash    Family History  Problem Relation Age of Onset   Emphysema Mother    COPD Mother    Heart disease Mother    Brain cancer Father      Prior to Admission medications   Medication Sig Start Date End Date Taking? Authorizing Provider  amLODipine (NORVASC) 10 MG tablet Take 10 mg by mouth daily. 04/08/23  Yes [provider]  diazepam (VALIUM) 2 MG tablet Take 2 mg by mouth every 12 (twelve) hours as needed. 04/21/23  Yes [provider]  sertraline (ZOLOFT) 50 MG tablet Take 50 mg by mouth daily. 04/21/23  Yes [provider]  spironolactone  (ALDACTONE) 50 MG tablet Take 50 mg by mouth daily. 12/25/22  Yes [provider]  torsemide (DEMADEX) 100 MG tablet Take 100 mg by mouth daily. 12/25/22  Yes [provider]  acetaminophen (TYLENOL) 500 MG tablet Take 1,000 mg by mouth every 6 (six) hours as needed (shoulder pain).    [provider]  albuterol (PROVENTIL HFA;VENTOLIN HFA) 108 (90 Base) MCG/ACT inhaler Inhale 2 puffs into the lungs every 6 (six) hours as needed for wheezing or shortness of breath.    [provider]  aspirin EC 81 MG tablet Take by mouth.    [provider]  calcitRIOL (ROCALTROL) 0.25 MCG capsule Take 0.25 mcg by mouth daily. 07/13/19   [provider]  cetirizine (ZYRTEC) 10 MG chewable tablet Chew 10 mg by mouth daily as needed for allergies.    [provider]  Continuous Blood Gluc Sensor (FREESTYLE LIBRE 14 DAY SENSOR) MISC by Does not apply route.    [provider]  fluticasone (FLONASE) 50 MCG/ACT nasal spray as needed. 03/17/17   [provider]  furosemide (LASIX) 40 MG tablet Take 80 mg q am and 40 mg qpm 06/21/22   Enedina Finner, MD  GLOBAL INJECT EASE INSULIN SYR 31G X 5/16" 0.5 ML MISC Inject into the skin. 08/16/21   [provider]  glucose blood (ONETOUCH ULTRA) test strip TEST BLOOD SUGAR 4 TIMES DAILY 10/26/18   [provider]  insulin aspart (NOVOLOG) 100 UNIT/ML injection Inject 14 Units into the skin 3 (three) times daily with meals. Patient taking differently: Inject 45 Units into the skin 3 (three) times daily with meals. Pt states he takes 40 units in am 35 units at lunch and 40 units at pm Pt states his insulin dosage was changed today to 20, 20, 20, 18 12/23/22 12/17/22   Wieting, Richard, MD  insulin detemir (LEVEMIR) 100 UNIT/ML injection Inject 0.5 mLs (50 Units total) into the skin at bedtime. Patient taking differently: Inject 42 Units into the skin at bedtime. 06/20/22   Enedina Finner, MD   isosorbide mononitrate (IMDUR) 30 MG 24 hr tablet Take 1 tablet (30 mg total) by mouth daily. 06/21/22   Enedina Finner, MD  Lancets [facility] DELICA PLUS LANCET30G) MISC USE 1 LANCET 4 TIMES DAILY 05/04/19   [provider]  lovastatin (MEVACOR) 40 MG tablet Take 40 mg by mouth daily with supper. 04/28/17   [provider]  metoprolol succinate (TOPROL-XL) 50 MG 24 hr tablet Take 50 mg by mouth daily. 09/01/21   [provider]  nitroGLYCERIN (NITROSTAT) 0.4 MG SL tablet Place 1 tablet (0.4 mg total) under the tongue every 5 (five) minutes as  needed for chest pain. 06/20/22   Enedina Finner, MD  potassium citrate (UROCIT-K) 10 MEQ (1080 MG) SR tablet TAKE (1) TABLET BY MOUTH TWICE DAILY WITH A MEAL. 10/21/22   Stoioff, Verna Czech, MD  tamsulosin (FLOMAX) 0.4 MG CAPS capsule TAKE (1) CAPSULE BY MOUTH EVERY DAY 04/07/23   Riki Altes, MD    Physical Exam: Vitals:   05/26/23 0749 05/26/23 0752 05/26/23 0815 05/26/23 0830  BP: (!) 167/78  (!) 165/88 (!) 165/88  Pulse: 76  69 70  Resp: 11  (!) 26 (!) 27  Temp: 97.8 F (36.6 C)     SpO2: 97%  94% 96%  Weight:  (!) 139.6 kg    Height:  5\' 10"  (1.778 m)      Constitutional: NAD, calm, comfortable Vitals:   05/26/23 0749 05/26/23 0752 05/26/23 0815 05/26/23 0830  BP: (!) 167/78  (!) 165/88 (!) 165/88  Pulse: 76  69 70  Resp: 11  (!) 26 (!) 27  Temp: 97.8 F (36.6 C)     SpO2: 97%  94% 96%  Weight:  (!) 139.6 kg    Height:  5\' 10"  (1.778 m)     Eyes: PERRL, lids and conjunctivae normal ENMT: Mucous membranes are moist. Posterior pharynx clear of any exudate or lesions.Normal dentition.  Neck: normal, supple, no masses, no thyromegaly Respiratory: clear to auscultation bilaterally, no wheezing, fine crackles to the bilateral mid levels, increasing respiratory effort. No accessory muscle use.  Cardiovascular: Regular rate and rhythm, no murmurs / rubs / gallops. No extremity edema. 2+ pedal pulses. No carotid bruits.   Abdomen: no tenderness, no masses palpated. No hepatosplenomegaly. Bowel sounds positive.  Musculoskeletal: no clubbing / cyanosis. No joint deformity upper and lower extremities. Good ROM, no contractures. Normal muscle tone.  Skin: no rashes, lesions, ulcers. No induration Neurologic: CN 2-12 grossly intact. Sensation intact, DTR normal. Strength 5/5 in all 4.  Psychiatric: Normal judgment and insight. Alert and oriented x 3. Normal mood.     Labs on Admission: I have personally reviewed following labs and imaging studies  CBC: Recent Labs  Lab 05/26/23 0749  WBC 13.1*  NEUTROABS 10.3*  HGB 10.9*  HCT 33.4*  MCV 91.8  PLT 294   Basic Metabolic Panel: Recent Labs  Lab 05/26/23 0749  NA 137  K 3.8  CL 100  CO2 25  GLUCOSE 220*  BUN 47*  CREATININE 3.35*  CALCIUM 8.3*   GFR: Estimated Creatinine Clearance: 24 mL/min (A) (by C-G formula based on SCr of 3.35 mg/dL (H)). Liver Function Tests: Recent Labs  Lab 05/26/23 0749  AST 20  ALT 18  ALKPHOS 74  BILITOT 0.5  PROT 6.7  ALBUMIN 2.9*   Recent Labs  Lab 05/26/23 0749  LIPASE 41   No results for input(s): "AMMONIA" in the last 168 hours. Coagulation Profile: No results for input(s): "INR", "PROTIME" in the last 168 hours. Cardiac Enzymes: No results for input(s): "CKTOTAL", "CKMB", "CKMBINDEX", "TROPONINI" in the last 168 hours. BNP (last 3 results) No results for input(s): "PROBNP" in the last 8760 hours. HbA1C: No results for input(s): "HGBA1C" in the last 72 hours. CBG: No results for input(s): "GLUCAP" in the last 168 hours. Lipid Profile: No results for input(s): "CHOL", "HDL", "LDLCALC", "TRIG", "CHOLHDL", "LDLDIRECT" in the last 72 hours. Thyroid Function Tests: No results for input(s): "TSH", "T4TOTAL", "FREET4", "T3FREE", "THYROIDAB" in the last 72 hours. Anemia Panel: No results for input(s): "VITAMINB12", "FOLATE", "FERRITIN", "TIBC", "IRON", "RETICCTPCT" in the  last 72 hours. Urine  analysis:    Component Value Date/Time   COLORURINE YELLOW (A) 02/28/2022 0958   APPEARANCEUR Clear 05/23/2022 1400   LABSPEC 1.013 02/28/2022 0958   LABSPEC 1.008 11/27/2013 0810   PHURINE 5.0 02/28/2022 0958   GLUCOSEU 1+ (A) 05/23/2022 1400   GLUCOSEU Negative 11/27/2013 0810   HGBUR LARGE (A) 02/28/2022 0958   BILIRUBINUR Negative 05/23/2022 1400   BILIRUBINUR Negative 11/27/2013 0810   KETONESUR 5 (A) 02/28/2022 0958   PROTEINUR 3+ (A) 05/23/2022 1400   PROTEINUR >=300 (A) 02/28/2022 0958   NITRITE Negative 05/23/2022 1400   NITRITE NEGATIVE 02/28/2022 0958   LEUKOCYTESUR Negative 05/23/2022 1400   LEUKOCYTESUR MODERATE (A) 02/28/2022 0958   LEUKOCYTESUR Negative 11/27/2013 0810    Radiological Exams on Admission: DG Chest Port 1 View  Result Date: 05/26/2023 CLINICAL DATA:  Chest pain.  Shortness of breath. EXAM: PORTABLE CHEST 1 VIEW COMPARISON:  12/13/2022. FINDINGS: Low lung volume. There is stable mild increased bronchovascular markings without frank pulmonary edema. There are probable atelectatic changes at the lung bases. Bilateral lung fields are otherwise clear. There is blunting of bilateral costophrenic angles, which may represent small bilateral pleural effusions. Stable moderately enlarged cardio-mediastinal silhouette. No acute osseous abnormalities. The soft tissues are within normal limits. There is right IJ hemodialysis catheter with its tip overlying the cavoatrial junction region. IMPRESSION: *Low lung volume. No frank pulmonary edema. Probable bilateral pleural effusions and atelectatic changes at the lung bases. Right IJ hemodialysis catheter noted. Electronically Signed   By: Jules Schick M.D.   On: 05/26/2023 08:54    EKG: Independently reviewed.  Sinus rhythm, chronic ST changes on V1-V3.  Assessment/Plan Principal Problem:   CHF (congestive heart failure) (HCC) Active Problems:   Acute on chronic diastolic CHF (congestive heart failure) (HCC)    Chronic kidney disease (CKD), stage IV (severe) (HCC)   Acute systolic CHF (congestive heart failure) (HCC)  (please populate well all problems here in Problem List. (For example, if patient is on BP meds at home and you resume or decide to hold them, it is a problem that needs to be her. Same for CAD, COPD, HLD and so on)  Anginal-like chest pain -With elevation of troponins, demanding ischemia versus NSTEMI.  Last year showed no significant obstruction, likely demanding ischemia, from acute on chronic HFrEF decompensation most likely. -Agreed with emergency dialysis -Will have to keep an eye on his blood pressure given the recent experience of HD associated hypotension.  But baseline patient does have a refractory HTN and taking multiple BP meds. -Case was discussed with [facility] cardiology who will see the patient this morning. -Continue heparin drip for now -Echo  HTN emergency -Emergency dialysis.  Plan to hold off today's amlodipine and metoprolol as per recommended by cardiology outpatient. -As needed hydralazine  CKD stage V -Fluid overload, HD today  Leukocytosis -No symptoms signs of acute infection, suspect this is reactive to CHF decompensation and new onset of chest pain, monitor off antibiotics.  Nonobstructive CAD -Continue aspirin and statin  Acute on chronic HFrEF -Diuresis, HD as above -Chest x-ray tomorrow -Continue Imdur and spironolactone  IDDM with hyperglycemia -Significant insulin resistance, continue Lantus 42 units daily and NovoLog 14 units 3 times daily AC -SSI -Add Tradjenta  Morbid obesity Deconditioning -Calorie control recommended -PT evaluation   DVT prophylaxis: Heparin drip Code Status: DNR Family Communication: Wife at bedside Disposition Plan: Patient is sick with CHF decompensation requiring emergency HD and inpatient cardiology consultation, and  ACS requiring heparin drip, expect more than 2 midnight hospital stay Consults called:  Cardiology Admission status: PCU admit   Emeline General MD [facility] Pager [phone]  05/26/2023, 9:44 AM

## 2023-05-26 NOTE — ED Triage Notes (Signed)
Pt arrives via [facility] from home for CP and SOB. Pt states he was awoken from sleep at 0300 with midsternal non radiating chest pressure and SOB. Pt states that he took 4 baby aspirin and one SL NTG without relief. Called EMS who administered 1 spray of NTG. Pt is also a MWF dialysis pt with last tx on Friday.

## 2023-05-26 NOTE — Progress Notes (Signed)
Got a call form lab stating patient is positive for MRSA, MD made aware of, no interventions needed as patient is afebrile.

## 2023-05-26 NOTE — ED Provider Notes (Addendum)
[facility] Provider Note    Event Date/Time   First MD Initiated Contact with Patient 05/26/23 0745     (approximate)   History   Chest Pain   HPI  James Clarke is a 82 y.o. male   Past medical history of end-stage renal disease on hemodialysis on Monday, Wednesday, Friday, CAD, CHF, diabetes, hypertension hyperlipidemia presents emerged apartment with chest tightness and shortness of breath as well as lightheadedness that awoke him from sleep around 3 AM this morning.  Currently with just shortness of breath ongoing.  He has chronic shortness of breath he relates there is CHF that has been slowly worsening over 2 years but this acute worsening just happened today.  He denies any symptoms yesterday, and denies any recent illnesses respiratory illnesses.  He has been compliant with his hemodialysis sessions his last session was on Friday and he is due for one later today.  He took 1 nitro and his chest pain resolved.  He denies any obvious swelling to his legs.   External Medical Documents Reviewed: Recent emergency department visit for hypotension during dialysis, resolved with fluids, as well as a vascular surgery office visit regarding his dialysis access sites,      Physical Exam   Triage Vital Signs: ED Triage Vitals  Encounter Vitals Group     BP      Systolic BP Percentile      Diastolic BP Percentile      Pulse      Resp      Temp      Temp src      SpO2      Weight      Height      Head Circumference      Peak Flow      Pain Score      Pain Loc      Pain Education      Exclude from Growth Chart     Most recent vital signs: Vitals:   05/26/23 0749  BP: (!) 167/78  Pulse: 76  Resp: 11  Temp: 97.8 F (36.6 C)  SpO2: 97%    General: Awake, no distress.  CV:  Good peripheral perfusion. Mild bilateral edema to legs/ankles Resp:  Normal effort.  Rales at bases bilaterally. Abd:  No distention.  Nontender to  palpation. Other:  Slightly hypertensive, otherwise vital signs normal, oxygenation is normal.   ED Results / Procedures / Treatments   Labs (all labs ordered are listed, but only abnormal results are displayed) Labs Reviewed  COMPREHENSIVE METABOLIC PANEL - Abnormal; Notable for the following components:      Result Value   Glucose, Bld 220 (*)    BUN 47 (*)    Creatinine, Ser 3.35 (*)    Calcium 8.3 (*)    Albumin 2.9 (*)    GFR, Estimated 18 (*)    All other components within normal limits  CBC WITH DIFFERENTIAL/PLATELET - Abnormal; Notable for the following components:   WBC 13.1 (*)    RBC 3.64 (*)    Hemoglobin 10.9 (*)    HCT 33.4 (*)    Neutro Abs 10.3 (*)    Abs Immature Granulocytes 0.09 (*)    All other components within normal limits  BLOOD GAS, VENOUS - Abnormal; Notable for the following components:   pO2, Ven <31 (*)    Bicarbonate 29.7 (*)    Acid-Base Excess 3.7 (*)    All other components within  normal limits  TROPONIN I (HIGH SENSITIVITY) - Abnormal; Notable for the following components:   Troponin I (High Sensitivity) 316 (*)    All other components within normal limits  LIPASE, BLOOD  BRAIN NATRIURETIC PEPTIDE     I ordered and reviewed the above labs they are notable for white blood cell count slightly elevated 13.1, consistent with chronic elevations noted in past testing.  VBG with normal pH and pCO2.  EKG  ED ECG REPORT I, Pilar Jarvis, the attending physician, personally viewed and interpreted this ECG.   Date: 05/26/2023  EKG Time: 0758  Rate: 74  Rhythm: sinus  Axis: nl  Intervals:none  ST&T Change: no stemi     RADIOLOGY I independently reviewed and interpreted chest x-ray and see bilateral opacities concerning for pulmonary edema I also reviewed radiologist's formal read.   PROCEDURES:  Critical Care performed: Yes, see critical care procedure note(s)  .Critical Care  Performed by: Pilar Jarvis, MD Authorized by: Pilar Jarvis, MD   Critical care provider statement:    Critical care time (minutes):  30   Critical care was time spent personally by me on the following activities:  Development of treatment plan with patient or surrogate, discussions with consultants, evaluation of patient's response to treatment, examination of patient, ordering and review of laboratory studies, ordering and review of radiographic studies, ordering and performing treatments and interventions, pulse oximetry, re-evaluation of patient's condition and review of old charts    MEDICATIONS ORDERED IN ED: Medications  nitroGLYCERIN (NITROSTAT) SL tablet 0.4 mg (has no administration in time range)  furosemide (LASIX) injection 80 mg (80 mg Intravenous Given 05/26/23 0814)    External physician / consultants:  I spoke with hospitalist and nephrologist regarding care plan for this patient.   IMPRESSION / MDM / ASSESSMENT AND PLAN / ED COURSE  I reviewed the triage vital signs and the nursing notes.                                Patient's presentation is most consistent with acute presentation with potential threat to life or bodily function.  Differential diagnosis includes, but is not limited to, ACS, CHF exacerbation, respiratory infection, considered but less likely PE, dissection   The patient is on the cardiac monitor to evaluate for evidence of arrhythmia and/or significant heart rate changes.  MDM:    Most likely CHF exacerbation as he has tightness in the chest and rales as well as shortness of breath.  He has evidence of pulmonary edema on my read of the chest x-ray and appears clinically fluid overloaded to a mild degree.  He is due for dialysis today.  He does make urine and takes Lasix I will give him an IV dose now.  Check for ACS with EKG which fortunately looks nonischemic, check some troponins.  In light of apparent CHF exacerbation history of the same explaining his symptoms, I considered but doubt PE at this  time.  -- Trop is 316. Will start heparin. Admit. Consult to nephrology for dialysis.  --      FINAL CLINICAL IMPRESSION(S) / ED DIAGNOSES   Final diagnoses:  NSTEMI (non-ST elevated myocardial infarction) (HCC)  Acute on chronic congestive heart failure, unspecified heart failure type (HCC)     Rx / DC Orders   ED Discharge Orders     None        Note:  This document was prepared  using Conservation officer, historic buildings and may include unintentional dictation errors.    Pilar Jarvis, MD 05/26/23 1308    Pilar Jarvis, MD 05/26/23 [phone]

## 2023-05-26 NOTE — Progress Notes (Signed)
[facility]  ROUNDING NOTE   Subjective:   James Clarke is a 82 year old male well-known to our practice.  Medical history consistent with hypertension, CHF, nonobstructive CAD, IDDM with diabetic nephropathy, obesity, BPH, and end-stage renal disease on hemodialysis.  Patient presents to the emergency room from home complaining of chest pain and shortness of breath and has been admitted for CHF (congestive heart failure) (HCC) [I50.9] NSTEMI (non-ST elevated myocardial infarction) (HCC) [I21.4] Acute on chronic congestive heart failure, unspecified heart failure type [facility]) [I50.9]  Patient is known to our practice and receives outpatient dialysis treatments at [facility] on a MWF schedule, supervised by Dr. Cherylann Ratel.  Last treatment completed on Friday, no complications.  Patient states he awoke this morning with chest pressure and shortness of breath.  Symptoms not relieved after 4 baby aspirin's and 1 sublingual nitro.  When EMS arrived, he received 1 spray of nitro with relief of symptoms.  Labs on ED arrival significant for BNP greater than 9000, troponin 316 with peak of 462.  Chest x-ray shows probable bilateral pleural effusions and atelectasis.  Echo pending.  We have been consulted to manage dialysis needs.   Objective:  Vital signs in last 24 hours:  Temp:  [97.8 F (36.6 C)-98.2 F (36.8 C)] 98.2 F (36.8 C) (11/04 1559) Pulse Rate:  [67-77] 68 (11/04 1559) Resp:  [11-27] 18 (11/04 1143) BP: (153-186)/(69-104) 158/69 (11/04 1559) SpO2:  [94 %-97 %] 96 % (11/04 1559) Weight:  [139.6 kg] 139.6 kg (11/04 0752)  Weight change:  Filed Weights   05/26/23 0752  Weight: (!) 139.6 kg    Intake/Output: No intake/output data recorded.   Intake/Output this shift:  Total I/O In: -  Out: 700 [Urine:700]  Physical Exam: General: NAD  Head: Normocephalic, atraumatic. Moist oral mucosal membranes  Eyes: Anicteric  Lungs:  Clear to auscultation, normal effort,  room air  Heart: Regular rate and rhythm  Abdomen:  Soft, nontender, obese  Extremities: No peripheral edema.  Neurologic: Alert and oriented, moving all four extremities  Skin: No lesions  Access: Right chest PermCath    Basic Metabolic Panel: Recent Labs  Lab 05/26/23 0749  NA 137  K 3.8  CL 100  CO2 25  GLUCOSE 220*  BUN 47*  CREATININE 3.35*  CALCIUM 8.3*    Liver Function Tests: Recent Labs  Lab 05/26/23 0749  AST 20  ALT 18  ALKPHOS 74  BILITOT 0.5  PROT 6.7  ALBUMIN 2.9*   Recent Labs  Lab 05/26/23 0749  LIPASE 41   No results for input(s): "AMMONIA" in the last 168 hours.  CBC: Recent Labs  Lab 05/26/23 0749  WBC 13.1*  NEUTROABS 10.3*  HGB 10.9*  HCT 33.4*  MCV 91.8  PLT 294    Cardiac Enzymes: No results for input(s): "CKTOTAL", "CKMB", "CKMBINDEX", "TROPONINI" in the last 168 hours.  BNP: Invalid input(s): "POCBNP"  CBG: Recent Labs  Lab 05/26/23 1245  GLUCAP 208*    Microbiology: Results for orders placed or performed during the hospital encounter of 06/12/22  Resp Panel by RT-PCR (Flu A&B, Covid) Anterior Nasal Swab     Status: None   Collection Time: 06/12/22  2:20 PM   Specimen: Anterior Nasal Swab  Result Value Ref Range Status   SARS Coronavirus 2 by RT PCR NEGATIVE NEGATIVE Final    Comment: (NOTE) SARS-CoV-2 target nucleic acids are NOT DETECTED.  The SARS-CoV-2 RNA is generally detectable in upper respiratory specimens during the acute phase  of infection. The lowest concentration of SARS-CoV-2 viral copies this assay can detect is 138 copies/mL. A negative result does not preclude SARS-Cov-2 infection and should not be used as the sole basis for treatment or other patient management decisions. A negative result may occur with  improper specimen collection/handling, submission of specimen other than nasopharyngeal swab, presence of viral mutation(s) within the areas targeted by this assay, and inadequate number of  viral copies(<138 copies/mL). A negative result must be combined with clinical observations, patient history, and epidemiological information. The expected result is Negative.  Fact Sheet for Patients:  BloggerCourse.com  Fact Sheet for Healthcare Providers:  SeriousBroker.it  This test is no t yet approved or cleared by the [place] FDA and  has been authorized for detection and/or diagnosis of SARS-CoV-2 by FDA under an Emergency Use Authorization (EUA). This EUA will remain  in effect (meaning this test can be used) for the duration of the COVID-19 declaration under Section 564(b)(1) of the Act, 21 U.S.C.section 360bbb-3(b)(1), unless the authorization is terminated  or revoked sooner.       Influenza A by PCR NEGATIVE NEGATIVE Final   Influenza B by PCR NEGATIVE NEGATIVE Final    Comment: (NOTE) The Xpert Xpress SARS-CoV-2/FLU/RSV plus assay is intended as an aid in the diagnosis of influenza from Nasopharyngeal swab specimens and should not be used as a sole basis for treatment. Nasal washings and aspirates are unacceptable for Xpert Xpress SARS-CoV-2/FLU/RSV testing.  Fact Sheet for Patients: BloggerCourse.com  Fact Sheet for Healthcare Providers: SeriousBroker.it  This test is not yet approved or cleared by the [place] FDA and has been authorized for detection and/or diagnosis of SARS-CoV-2 by FDA under an Emergency Use Authorization (EUA). This EUA will remain in effect (meaning this test can be used) for the duration of the COVID-19 declaration under Section 564(b)(1) of the Act, 21 U.S.C. section 360bbb-3(b)(1), unless the authorization is terminated or revoked.  Performed at [facility], [address] [place], [place] [address]     Coagulation Studies: Recent Labs    05/26/23 0807  LABPROT 13.7  INR 1.0    Urinalysis: No results  for input(s): "COLORURINE", "LABSPEC", "PHURINE", "GLUCOSEU", "HGBUR", "BILIRUBINUR", "KETONESUR", "PROTEINUR", "UROBILINOGEN", "NITRITE", "LEUKOCYTESUR" in the last 72 hours.  Invalid input(s): "APPERANCEUR"    Imaging: DG Chest Port 1 View  Result Date: 05/26/2023 CLINICAL DATA:  Chest pain.  Shortness of breath. EXAM: PORTABLE CHEST 1 VIEW COMPARISON:  12/13/2022. FINDINGS: Low lung volume. There is stable mild increased bronchovascular markings without frank pulmonary edema. There are probable atelectatic changes at the lung bases. Bilateral lung fields are otherwise clear. There is blunting of bilateral costophrenic angles, which may represent small bilateral pleural effusions. Stable moderately enlarged cardio-mediastinal silhouette. No acute osseous abnormalities. The soft tissues are within normal limits. There is right IJ hemodialysis catheter with its tip overlying the cavoatrial junction region. IMPRESSION: *Low lung volume. No frank pulmonary edema. Probable bilateral pleural effusions and atelectatic changes at the lung bases. Right IJ hemodialysis catheter noted. Electronically Signed   By: Jules Schick M.D.   On: 05/26/2023 08:54     Medications:    sodium chloride     heparin 1,400 Units/hr (05/26/23 0939)    [START ON 05/27/2023] aspirin EC  81 mg Oral Daily   fluticasone  1 spray Each Nare Daily   [START ON 05/27/2023] furosemide  80 mg Oral BID   insulin aspart  0-9 Units Subcutaneous TID WC   insulin  aspart  14 Units Subcutaneous TID WC   insulin detemir  42 Units Subcutaneous QHS   isosorbide mononitrate  30 mg Oral Daily   linagliptin  5 mg Oral Daily   loratadine  10 mg Oral Daily   metoprolol succinate  50 mg Oral Daily   potassium citrate  20 mEq Oral BID AC   pravastatin  40 mg Oral q1800   sertraline  50 mg Oral Daily   sodium chloride flush  3 mL Intravenous Q12H   tamsulosin  0.4 mg Oral QPC supper   sodium chloride, acetaminophen, albuterol, diazepam,  hydrALAZINE, nitroGLYCERIN, ondansetron (ZOFRAN) IV, sodium chloride flush  Assessment/ Plan:  [name] is a 82 y.o.  male well-known to our practice.  Medical history consistent with hypertension, CHF, nonobstructive CAD, IDDM with diabetic nephropathy, obesity, BPH, and end-stage renal disease on hemodialysis.  Patient presents to the emergency room from home complaining of chest pain and shortness of breath and has been admitted for CHF (congestive heart failure) (HCC) [I50.9] NSTEMI (non-ST elevated myocardial infarction) (HCC) [I21.4] Acute on chronic congestive heart failure, unspecified heart failure type (HCC) [I50.9]  [facility]/MWF/right chest PermCath  End-stage renal disease on hemodialysis.  Last treatment received on Friday.  Due to recent cardiac events, will allow patient to rest today and allow for further workup.  Next treatment scheduled for Tuesday.  2.  Chest pain with elevated troponin.  Cardiology consulted, feels this is likely demand ischemia.  Echo pending.  Will continue IV furosemide, heparin infusion, and home regimen of Imdur metoprolol.  3. Anemia of chronic kidney disease Lab Results  Component Value Date   HGB 10.9 (L) 05/26/2023    Patient receives Mircera at outpatient clinic.  Hemoglobin currently within desired goal.  Will continue to monitor  4. Secondary Hyperparathyroidism: with outpatient labs: PTH 310, phosphorus 4.6, calcium 8.7 on 04/11/23.   Lab Results  Component Value Date   CALCIUM 8.3 (L) 05/26/2023   CAION 1.08 (L) 09/19/2022   PHOS 4.4 12/13/2022    Bone minerals acceptable at this time.  Will continue to monitor during this admission.  5.  Hypertension with chronic kidney disease Home regimen includes isosorbide, amlodipine, metoprolol, spironolactone, and torsemide.  Spironolactone and torsemide held.  Currently receiving IV furosemide.   LOS: 0 James Clarke 11/4/20244:23 PM

## 2023-05-26 NOTE — ED Notes (Signed)
ED TO INPATIENT HANDOFF REPORT  ED Nurse Name and Phone #: [name] Name/Age/Gender James Clarke Pocius 82 y.o. male Room/Bed: ED17A/ED17A  Code Status   Code Status: Limited: Do not attempt resuscitation (DNR) -DNR-LIMITED -Do Not Intubate/DNI   Home/SNF/Other Home Patient oriented to: self, place, time, and situation Is this baseline? Yes   Triage Complete: Triage complete  Chief Complaint CHF (congestive heart failure) (HCC) [I50.9]  Triage Note Pt arrives via [facility] from home for CP and SOB. Pt states he was awoken from sleep at 0300 with midsternal non radiating chest pressure and SOB. Pt states that he took 4 baby aspirin and one SL NTG without relief. Called EMS who administered 1 spray of NTG. Pt is also a MWF dialysis pt with last tx on Friday.    Allergies Allergies  Allergen Reactions   Codeine Nausea And Vomiting   Doxycycline    Erythromycin Rash    Level of Care/Admitting Diagnosis ED Disposition     ED Disposition  Admit   Condition  --   Comment  Hospital Area: [facility] [[id number]]  Level of Care: Progressive [102]  Admit to Progressive based on following criteria: CARDIOVASCULAR & THORACIC of moderate stability with acute coronary syndrome symptoms/low risk myocardial infarction/hypertensive urgency/arrhythmias/heart failure potentially compromising stability and stable post cardiovascular intervention patients.  Covid Evaluation: Asymptomatic - no recent exposure (last 10 days) testing not required  Diagnosis: CHF (congestive heart failure) (HCC) [161096]  Admitting Physician: Emeline General [[id number]]  Attending Physician: Emeline General [[id number]]  Certification:: I certify this patient will need inpatient services for at least 2 midnights  Expected Medical Readiness: 05/28/2023          B Medical/Surgery History Past Medical History:  Diagnosis Date   (HFpEF) heart failure with preserved ejection fraction (HCC)    a.)  TTE 11/26/2021: EF 45%, mod LVH, post HK, mod LAE, mild MR/TR, G2DD; b.) TTE 02/26/2022: EF 55-60%, BAE, triv MR, G2DD; c.) TTE 07/09/2022: EF 45%, post HK, LVH, mod LAE, triv TR/PR, mild MR, G1DD   Anemia of chronic renal failure    Aortic atherosclerosis (HCC)    Asthma    Bell's palsy    BPH (benign prostatic hyperplasia)    CAD (coronary artery disease)    a.) LHC 12/31/2021: 60% mLAD, 20% pRCA, 20% dRCA, 20% o-pLCx --> med mgmt.   CKD (chronic kidney disease), stage IV (HCC)    Diabetic neuropathy (HCC)    Dyspnea on exertion    Ganglion cyst of wrist, right    a.) s/p excision 04/2011   Hepatic steatosis    History of 2019 novel coronavirus disease (COVID-19)    History of bilateral cataract extraction 2018   Hyperlipidemia    Hyperparathyroidism due to renal insufficiency (HCC)    Hypertension    Low testosterone in male    Nephrolithiasis    Obesity    OSA treated with BiPAP    Osteoarthritis    Peripheral edema    Right inguinal hernia    a.) s/p repair   Skin cancer    Type 2 diabetes mellitus with renal manifestations [facility])    Past Surgical History:  Procedure Laterality Date   A/V FISTULAGRAM Left 01/13/2023   Procedure: A/V Fistulagram;  Surgeon: Annice Needy, MD;  Location: [facility];  Service: Cardiovascular;  Laterality: Left;   A/V SHUNT INTERVENTION Left 04/11/2023   Procedure: A/V SHUNT INTERVENTION;  Surgeon: Festus Barren  S, MD;  Location: [facility];  Service: Cardiovascular;  Laterality: Left;   APPENDECTOMY     AV FISTULA PLACEMENT Left 09/19/2022   Procedure: ARTERIOVENOUS (AV) FISTULA CREATION (RADIO CEPHALIC);  Surgeon: Annice Needy, MD;  Location: [facility];  Service: Vascular;  Laterality: Left;   CATARACT EXTRACTION W/PHACO Left 05/20/2017   Procedure: CATARACT EXTRACTION PHACO AND INTRAOCULAR LENS PLACEMENT (IOC);  Surgeon: Galen Manila, MD;  Location: [facility];  Service: Ophthalmology;  Laterality: Left;  [place] 00:32.0 AP%  15.5 CDE 4.97 Fluid Pack Lot # [id number]   CATARACT EXTRACTION W/PHACO Right 06/10/2017   Procedure: CATARACT EXTRACTION PHACO AND INTRAOCULAR LENS PLACEMENT (IOC);  Surgeon: Galen Manila, MD;  Location: [facility];  Service: Ophthalmology;  Laterality: Right;  [place]  00:51 AP% 15.4 CDE 7.95 Fluid pack lot # [id number]   CHOLECYSTECTOMY     COLONOSCOPY     x3   CYSTOSCOPY W/ RETROGRADES Right 08/13/2019   Procedure: CYSTOSCOPY WITH RETROGRADE PYELOGRAM;  Surgeon: Sondra Come, MD;  Location: [facility];  Service: Urology;  Laterality: Right;   CYSTOSCOPY WITH STENT PLACEMENT Right 07/26/2019   Procedure: CYSTOSCOPY WITH STENT PLACEMENT;  Surgeon: Crista Elliot, MD;  Location: [facility];  Service: Urology;  Laterality: Right;   CYSTOSCOPY/URETEROSCOPY/HOLMIUM LASER/STENT PLACEMENT Right 08/13/2019   Procedure: CYSTOSCOPY/URETEROSCOPY/HOLMIUM LASER/STENT EXCHANGE;  Surgeon: Sondra Come, MD;  Location: [facility];  Service: Urology;  Laterality: Right;   CYSTOSCOPY/URETEROSCOPY/HOLMIUM LASER/STENT PLACEMENT Left 02/27/2022   Procedure: CYSTOSCOPY/URETEROSCOPY/HOLMIUM LASER/STENT PLACEMENT;  Surgeon: Riki Altes, MD;  Location: [facility];  Service: Urology;  Laterality: Left;   DIALYSIS/PERMA CATHETER INSERTION N/A 04/11/2023   Procedure: DIALYSIS/PERMA CATHETER INSERTION;  Surgeon: Annice Needy, MD;  Location: [facility];  Service: Cardiovascular;  Laterality: N/A;   GANGLION CYST EXCISION Right    INGUINAL HERNIA REPAIR Right    LEFT HEART CATH AND CORONARY ANGIOGRAPHY N/A 12/31/2021   Procedure: LEFT HEART CATH AND CORONARY ANGIOGRAPHY;  Surgeon: Lamar Blinks, MD;  Location: [facility];  Service: Cardiovascular;  Laterality: N/A;   SHOULDER ARTHROSCOPY WITH SUBACROMIAL DECOMPRESSION AND OPEN ROTATOR C Right 09/11/2020   Procedure: Right shoulder arthroscopic rotator cuff repair vs Regeneten patch application, subacromial decompression, and biceps tenodesis -  Dedra Skeens to Assist;  Surgeon: Signa Kell, MD;  Location: [facility];  Service: Orthopedics;  Laterality: Right;   TEMPORARY DIALYSIS CATHETER N/A 03/05/2022   Procedure: TEMPORARY DIALYSIS CATHETER;  Surgeon: Renford Dills, MD;  Location: [facility];  Service: Cardiovascular;  Laterality: N/A;   TONSILLECTOMY     URETEROSCOPY WITH HOLMIUM LASER LITHOTRIPSY Left 02/27/2022   Procedure: URETEROSCOPY WITH HOLMIUM LASER LITHOTRIPSY;  Surgeon: Riki Altes, MD;  Location: [facility];  Service: Urology;  Laterality: Left;   WRIST FRACTURE SURGERY Right      A IV Location/Drains/Wounds Patient Lines/Drains/Airways Status     Active Line/Drains/Airways     Name Placement date Placement time Site Days   Peripheral IV 05/26/23 18 G 2.5" Anterior;Right;Upper Arm 05/26/23  0811  Arm  less than 1   Peripheral IV 05/26/23 20 G 1.88" Anterior;Proximal;Right Forearm 05/26/23  0938  Forearm  less than 1   Fistula / Graft Left Forearm Arteriovenous fistula 09/19/22  0845  Forearm  249   Hemodialysis Catheter Right Internal jugular Double lumen Permanent (Tunneled) 04/11/23  0955  Internal jugular  45   Ureteral Drain/Stent Left ureter 6 Fr. 02/27/22  1046  Left ureter  453            Intake/Output Last 24 hours No intake or output data in the 24 hours ending 05/26/23 1048  Labs/Imaging Results for orders placed or performed during the hospital encounter of 05/26/23 (from the past 48 hour(s))  Brain natriuretic peptide     Status: Abnormal   Collection Time: 05/26/23  7:49 AM  Result Value Ref Range   B Natriuretic Peptide 1,047.2 (H) 0.0 - 100.0 pg/mL    Comment: Performed at [facility], [address] [place], [place] [address]  Comprehensive metabolic panel     Status: Abnormal   Collection Time: 05/26/23  7:49 AM  Result Value Ref Range   Sodium 137 135 - 145 mmol/L   Potassium 3.8 3.5 - 5.1 mmol/L   Chloride 100 98 - 111 mmol/L   CO2 25 22 - 32 mmol/L    Glucose, Bld 220 (H) 70 - 99 mg/dL    Comment: Glucose reference range applies only to samples taken after fasting for at least 8 hours.   BUN 47 (H) 8 - 23 mg/dL   Creatinine, Ser 6.04 (H) 0.61 - 1.24 mg/dL   Calcium 8.3 (L) 8.9 - 10.3 mg/dL   Total Protein 6.7 6.5 - 8.1 g/dL   Albumin 2.9 (L) 3.5 - 5.0 g/dL   AST 20 15 - 41 U/L   ALT 18 0 - 44 U/L   Alkaline Phosphatase 74 38 - 126 U/L   Total Bilirubin 0.5 <1.2 mg/dL   GFR, Estimated 18 (L) >60 mL/min    Comment: (NOTE) Calculated using the CKD-EPI Creatinine Equation (2021)    Anion gap 12 5 - 15    Comment: Performed at [facility], [address] [place], [place] [address]  Lipase, blood     Status: None   Collection Time: 05/26/23  7:49 AM  Result Value Ref Range   Lipase 41 11 - 51 U/L    Comment: Performed at [facility], [address] [place], [place] [address]  Troponin I (High Sensitivity)     Status: Abnormal   Collection Time: 05/26/23  7:49 AM  Result Value Ref Range   Troponin I (High Sensitivity) 316 (HH) <18 ng/L    Comment: CRITICAL RESULT CALLED TO, READ BACK BY AND VERIFIED WITH BILLIE Gohan Collister 05/26/23 0843 MW (NOTE) Elevated high sensitivity troponin I (hsTnI) values and significant  changes across serial measurements may suggest ACS but many other  chronic and acute conditions are known to elevate hsTnI results.  Refer to the "Links" section for chest pain algorithms and additional  guidance. Performed at [facility], [address] [place], [place] [address]   CBC with Differential     Status: Abnormal   Collection Time: 05/26/23  7:49 AM  Result Value Ref Range   WBC 13.1 (H) 4.0 - 10.5 K/uL   RBC 3.64 (L) 4.22 - 5.81 MIL/uL   Hemoglobin 10.9 (L) 13.0 - 17.0 g/dL   HCT 62.1 (L) 30.8 - 65.7 %   MCV 91.8 80.0 - 100.0 fL   MCH 29.9 26.0 - 34.0 pg   MCHC 32.6 30.0 - 36.0 g/dL   RDW 84.6 96.2 - 95.2 %   Platelets 294 150 - 400 K/uL   nRBC 0.0 0.0 - 0.2 %    Neutrophils Relative % 78 %   Neutro Abs 10.3 (H) 1.7 - 7.7 K/uL   Lymphocytes Relative 11 %   Lymphs Abs 1.4 0.7 -  4.0 K/uL   Monocytes Relative 7 %   Monocytes Absolute 0.9 0.1 - 1.0 K/uL   Eosinophils Relative 2 %   Eosinophils Absolute 0.3 0.0 - 0.5 K/uL   Basophils Relative 1 %   Basophils Absolute 0.1 0.0 - 0.1 K/uL   Immature Granulocytes 1 %   Abs Immature Granulocytes 0.09 (H) 0.00 - 0.07 K/uL    Comment: Performed at [facility], [address] [place], [place] [address]  Protime-INR     Status: None   Collection Time: 05/26/23  8:07 AM  Result Value Ref Range   Prothrombin Time 13.7 11.4 - 15.2 seconds   INR 1.0 0.8 - 1.2    Comment: (NOTE) INR goal varies based on device and disease states. Performed at [facility], [address] [place], [place] [address]   APTT     Status: None   Collection Time: 05/26/23  8:07 AM  Result Value Ref Range   aPTT 32 24 - 36 seconds    Comment: Performed at [facility], [address] [place], [place] [address]  Blood gas, venous     Status: Abnormal   Collection Time: 05/26/23  8:30 AM  Result Value Ref Range   pH, Ven 7.39 7.25 - 7.43   pCO2, Ven 49 44 - 60 mmHg   pO2, Ven <31 (LL) 32 - 45 mmHg   Bicarbonate 29.7 (H) 20.0 - 28.0 mmol/L   Acid-Base Excess 3.7 (H) 0.0 - 2.0 mmol/L   O2 Saturation 44.4 %   Patient temperature 37.0    Collection site VEIN     Comment: Performed at [facility], [address] [place], [place] [address]   DG Chest Port 1 View  Result Date: 05/26/2023 CLINICAL DATA:  Chest pain.  Shortness of breath. EXAM: PORTABLE CHEST 1 VIEW COMPARISON:  12/13/2022. FINDINGS: Low lung volume. There is stable mild increased bronchovascular markings without frank pulmonary edema. There are probable atelectatic changes at the lung bases. Bilateral lung fields are otherwise clear. There is blunting of bilateral costophrenic angles, which may represent small bilateral  pleural effusions. Stable moderately enlarged cardio-mediastinal silhouette. No acute osseous abnormalities. The soft tissues are within normal limits. There is right IJ hemodialysis catheter with its tip overlying the cavoatrial junction region. IMPRESSION: *Low lung volume. No frank pulmonary edema. Probable bilateral pleural effusions and atelectatic changes at the lung bases. Right IJ hemodialysis catheter noted. Electronically Signed   By: Jules Schick M.D.   On: 05/26/2023 08:54    Pending Labs Unresulted Labs (From admission, onward)     Start     Ordered   05/27/23 0500  Basic metabolic panel  Daily,   STAT     Comments: As Scheduled for 5 days    05/26/23 0942   05/27/23 0500  CBC  Tomorrow morning,   R        05/26/23 0953   05/26/23 1800  Heparin level (unfractionated)  Once-Timed,   TIMED        05/26/23 0953   05/26/23 0941  Hemoglobin A1c  Once,   R       Comments: To assess prior glycemic control    05/26/23 0942            Vitals/Pain Today's Vitals   05/26/23 0815 05/26/23 0830 05/26/23 0900 05/26/23 1000  BP: (!) 165/88 (!) 165/88 (!) 186/93 (!) 173/104  Pulse: 69 70 68 73  Resp: (!) 26 (!) 27 (!)  25 (!) 21  Temp:      SpO2: 94% 96% 95% 96%  Weight:      Height:      PainSc:        Isolation Precautions No active isolations  Medications Medications  nitroGLYCERIN (NITROSTAT) SL tablet 0.4 mg (has no administration in time range)  heparin ADULT infusion 100 units/mL (25000 units/240mL) (1,400 Units/hr Intravenous New Bag/Given 05/26/23 0939)  sodium chloride flush (NS) 0.9 % injection 3 mL (has no administration in time range)  sodium chloride flush (NS) 0.9 % injection 3 mL (has no administration in time range)  0.9 %  sodium chloride infusion (has no administration in time range)  acetaminophen (TYLENOL) tablet 650 mg (has no administration in time range)  ondansetron (ZOFRAN) injection 4 mg (has no administration in time range)  insulin aspart  (novoLOG) injection 0-9 Units (has no administration in time range)  acetaminophen (TYLENOL) tablet 1,000 mg (has no administration in time range)  aspirin EC tablet 81 mg (has no administration in time range)  isosorbide mononitrate (IMDUR) 24 hr tablet 30 mg (has no administration in time range)  pravastatin (PRAVACHOL) tablet 40 mg (has no administration in time range)  metoprolol succinate (TOPROL-XL) 24 hr tablet 50 mg (has no administration in time range)  spironolactone (ALDACTONE) tablet 50 mg (has no administration in time range)  diazepam (VALIUM) tablet 2 mg (has no administration in time range)  sertraline (ZOLOFT) tablet 50 mg (has no administration in time range)  insulin aspart (novoLOG) injection 14 Units (has no administration in time range)  insulin detemir (LEVEMIR) injection 42 Units (has no administration in time range)  potassium citrate (UROCIT-K) SR tablet 20 mEq (has no administration in time range)  tamsulosin (FLOMAX) capsule 0.4 mg (has no administration in time range)  albuterol (VENTOLIN HFA) 108 (90 Base) MCG/ACT inhaler 2 puff (has no administration in time range)  loratadine (CLARITIN) tablet 10 mg (has no administration in time range)  fluticasone (FLONASE) 50 MCG/ACT nasal spray 1 spray (has no administration in time range)  furosemide (LASIX) tablet 80 mg (has no administration in time range)  hydrALAZINE (APRESOLINE) injection 5 mg (has no administration in time range)  linagliptin (TRADJENTA) tablet 5 mg (has no administration in time range)  furosemide (LASIX) injection 80 mg (80 mg Intravenous Given 05/26/23 0814)  heparin bolus via infusion 4,000 Units (4,000 Units Intravenous Bolus from Bag 05/26/23 0939)    Mobility walks with device     Focused Assessments Cardiac Assessment Handoff:    Lab Results  Component Value Date   TROPONINI 0.03 08/12/2015   No results found for: "DDIMER" Does the Patient currently have chest pain? No     R Recommendations: See Admitting Provider Note  Report given to:   Additional Notes:

## 2023-05-26 NOTE — Consult Note (Signed)
[facility] CLINIC CARDIOLOGY CONSULT NOTE       Patient ID: James Clarke MRN: [id number] DOB/AGE: Aug 12, 1940 82 y.o.  Admit date: 05/26/2023 Referring Physician Mikey College, MD Primary Physician Feldpausch, Madaline Guthrie, MD Primary Cardiologist [name], PA-C Reason for Consultation Heart Failure  HPI: James Clarke is a 82 y.o. male  with a past medical history of HFpEF (50-55%, 12/17/2022) aortic atherosclerosis, CAD with LHC 12/2021, hypertension, sleep apnea on BiPAP, type 2 diabetes, hyperlipidemia, ESRD on HD (M,W,F) who presented to the ED on 05/26/2023 for chest tightness and shortness of breath. Cardiology was consulted for further evaluation.   Patient presented to the ED via EMS with worsening shortness of breath and chest tightness. Patient states he had his first episode of chest pressure at 3:30 AM which woke him up from sleep. States that he just laid in bed and tried to go back to sleep. He had a second episode at 7:00 AM and his wife called EMS. He was given nitroglycerin spray by EMS, states that he never noticed any real improvement with nitroglycerin.   Workup in the ED notable for Na 137, K 3.8, Cr 3.35, albumin 2.9, GFR 18, WBC 13.1, Hgb 10.0. BNP 1,047. Troponin 316 > 353. EKG showing NSR at rate 73 bpm. CXR shows bilateral pleural effusions. He was given a dose of IV lasix 80 mg in the ED and reports good UOP with this.   At the time of my evaluation, patient is laying in his hospital bed at an incline. Patient states he has been having shortness of breath for the past 2 years and states when he started dialysis 1 month ago his SOB improved for the first 2 weeks. He was able to walk with a cane with minimal SOB 2 weeks ago. 2 weeks ago patient developed SOB with exertion and at rest, this has been progressively worsening. Patient reports he can barely talk or eat without getting short of breath. His symptoms got much worse over the weekend. He states he hasn't noticed any  leg swelling that is worse than usually. Discussed his chest pain episode this AM, he describes this as pressure. Denies any recurrence since earlier today. Currently, patient denies any chest discomfort, palpitations, or abdominal fullness. Reports headache.    Of note, his medications have been adjusted recently due to episodes of hypotension and hypoglycemia with dialysis. Wife reports that his BP has been in the 140/80s on non-dialysis days. This is usually lower on days he has dialysis. Did have one episode of dizziness, weakness after dialysis a few weeks ago. BP low in the 80s systolic on this date, was taken to the ED and received IVF and reports he felt much better after this.  Review of systems complete and found to be negative unless listed above    Past Medical History:  Diagnosis Date   (HFpEF) heart failure with preserved ejection fraction (HCC)    a.) TTE 11/26/2021: EF 45%, mod LVH, post HK, mod LAE, mild MR/TR, G2DD; b.) TTE 02/26/2022: EF 55-60%, BAE, triv MR, G2DD; c.) TTE 07/09/2022: EF 45%, post HK, LVH, mod LAE, triv TR/PR, mild MR, G1DD   Anemia of chronic renal failure    Aortic atherosclerosis (HCC)    Asthma    Bell's palsy    BPH (benign prostatic hyperplasia)    CAD (coronary artery disease)    a.) LHC 12/31/2021: 60% mLAD, 20% pRCA, 20% dRCA, 20% o-pLCx --> med mgmt.   CKD (chronic kidney  disease), stage IV (HCC)    Diabetic neuropathy (HCC)    Dyspnea on exertion    Ganglion cyst of wrist, right    a.) s/p excision 04/2011   Hepatic steatosis    History of 2019 novel coronavirus disease (COVID-19)    History of bilateral cataract extraction 2018   Hyperlipidemia    Hyperparathyroidism due to renal insufficiency (HCC)    Hypertension    Low testosterone in male    Nephrolithiasis    Obesity    OSA treated with BiPAP    Osteoarthritis    Peripheral edema    Right inguinal hernia    a.) s/p repair   Skin cancer    Type 2 diabetes mellitus with renal  manifestations [facility])     Past Surgical History:  Procedure Laterality Date   A/V FISTULAGRAM Left 01/13/2023   Procedure: A/V Fistulagram;  Surgeon: Annice Needy, MD;  Location: [facility] INVASIVE CV LAB;  Service: Cardiovascular;  Laterality: Left;   A/V SHUNT INTERVENTION Left 04/11/2023   Procedure: A/V SHUNT INTERVENTION;  Surgeon: Annice Needy, MD;  Location: [facility] INVASIVE CV LAB;  Service: Cardiovascular;  Laterality: Left;   APPENDECTOMY     AV FISTULA PLACEMENT Left 09/19/2022   Procedure: ARTERIOVENOUS (AV) FISTULA CREATION (RADIO CEPHALIC);  Surgeon: Annice Needy, MD;  Location: [facility] ORS;  Service: Vascular;  Laterality: Left;   CATARACT EXTRACTION W/PHACO Left 05/20/2017   Procedure: CATARACT EXTRACTION PHACO AND INTRAOCULAR LENS PLACEMENT (IOC);  Surgeon: Galen Manila, MD;  Location: [facility] ORS;  Service: Ophthalmology;  Laterality: Left;  [place] 00:32.0 AP% 15.5 CDE 4.97 Fluid Pack Lot # [id number]   CATARACT EXTRACTION W/PHACO Right 06/10/2017   Procedure: CATARACT EXTRACTION PHACO AND INTRAOCULAR LENS PLACEMENT (IOC);  Surgeon: Galen Manila, MD;  Location: [facility] ORS;  Service: Ophthalmology;  Laterality: Right;  [place]  00:51 AP% 15.4 CDE 7.95 Fluid pack lot # [id number]   CHOLECYSTECTOMY     COLONOSCOPY     x3   CYSTOSCOPY W/ RETROGRADES Right 08/13/2019   Procedure: CYSTOSCOPY WITH RETROGRADE PYELOGRAM;  Surgeon: Sondra Come, MD;  Location: [facility] ORS;  Service: Urology;  Laterality: Right;   CYSTOSCOPY WITH STENT PLACEMENT Right 07/26/2019   Procedure: CYSTOSCOPY WITH STENT PLACEMENT;  Surgeon: Crista Elliot, MD;  Location: [facility] ORS;  Service: Urology;  Laterality: Right;   CYSTOSCOPY/URETEROSCOPY/HOLMIUM LASER/STENT PLACEMENT Right 08/13/2019   Procedure: CYSTOSCOPY/URETEROSCOPY/HOLMIUM LASER/STENT EXCHANGE;  Surgeon: Sondra Come, MD;  Location: [facility] ORS;  Service: Urology;  Laterality: Right;   CYSTOSCOPY/URETEROSCOPY/HOLMIUM LASER/STENT PLACEMENT Left 02/27/2022    Procedure: CYSTOSCOPY/URETEROSCOPY/HOLMIUM LASER/STENT PLACEMENT;  Surgeon: Riki Altes, MD;  Location: [facility] ORS;  Service: Urology;  Laterality: Left;   DIALYSIS/PERMA CATHETER INSERTION N/A 04/11/2023   Procedure: DIALYSIS/PERMA CATHETER INSERTION;  Surgeon: Annice Needy, MD;  Location: [facility] INVASIVE CV LAB;  Service: Cardiovascular;  Laterality: N/A;   GANGLION CYST EXCISION Right    INGUINAL HERNIA REPAIR Right    LEFT HEART CATH AND CORONARY ANGIOGRAPHY N/A 12/31/2021   Procedure: LEFT HEART CATH AND CORONARY ANGIOGRAPHY;  Surgeon: Lamar Blinks, MD;  Location: [facility] INVASIVE CV LAB;  Service: Cardiovascular;  Laterality: N/A;   SHOULDER ARTHROSCOPY WITH SUBACROMIAL DECOMPRESSION AND OPEN ROTATOR C Right 09/11/2020   Procedure: Right shoulder arthroscopic rotator cuff repair vs Regeneten patch application, subacromial decompression, and biceps tenodesis - Dedra Skeens to Assist;  Surgeon: Signa Kell, MD;  Location: [facility] ORS;  Service: Orthopedics;  Laterality: Right;   TEMPORARY DIALYSIS CATHETER  N/A 03/05/2022   Procedure: TEMPORARY DIALYSIS CATHETER;  Surgeon: Renford Dills, MD;  Location: [facility] INVASIVE CV LAB;  Service: Cardiovascular;  Laterality: N/A;   TONSILLECTOMY     URETEROSCOPY WITH HOLMIUM LASER LITHOTRIPSY Left 02/27/2022   Procedure: URETEROSCOPY WITH HOLMIUM LASER LITHOTRIPSY;  Surgeon: Riki Altes, MD;  Location: [facility] ORS;  Service: Urology;  Laterality: Left;   WRIST FRACTURE SURGERY Right     Medications Prior to Admission  Medication Sig Dispense Refill Last Dose   acetaminophen (TYLENOL) 500 MG tablet Take 1,000 mg by mouth every 6 (six) hours as needed (shoulder pain).   prn at unk   albuterol (PROVENTIL HFA;VENTOLIN HFA) 108 (90 Base) MCG/ACT inhaler Inhale 2 puffs into the lungs every 6 (six) hours as needed for wheezing or shortness of breath.   prn at unk   aspirin EC 81 MG tablet Take by mouth.   05/25/2023   calcitRIOL (ROCALTROL) 0.25 MCG capsule  Take 0.25 mcg by mouth daily.   05/25/2023   cetirizine (ZYRTEC) 10 MG chewable tablet Chew 10 mg by mouth daily as needed for allergies.   05/25/2023   diazepam (VALIUM) 2 MG tablet Take 2 mg by mouth every 12 (twelve) hours as needed.   prn at unk   fluticasone (FLONASE) 50 MCG/ACT nasal spray as needed.   prn at unk   furosemide (LASIX) 40 MG tablet Take 80 mg q am and 40 mg qpm (Patient taking differently: Take 80 mg by mouth 2 (two) times daily. On non dialysis days, Sunday, Tuesday, Thursday, Saturday and Sunday) 60 tablet 2 05/25/2023   insulin aspart (NOVOLOG) 100 UNIT/ML injection Inject 14 Units into the skin 3 (three) times daily with meals. (Patient taking differently: Inject 35 Units into the skin 3 (three) times daily with meals.) 10 mL 11 05/25/2023 at pm   insulin detemir (LEVEMIR) 100 UNIT/ML injection Inject 0.5 mLs (50 Units total) into the skin at bedtime. (Patient taking differently: Inject 35 Units into the skin at bedtime.) 10 mL 1 05/25/2023   isosorbide mononitrate (IMDUR) 30 MG 24 hr tablet Take 1 tablet (30 mg total) by mouth daily. 30 tablet 2 05/25/2023   lovastatin (MEVACOR) 40 MG tablet Take 40 mg by mouth daily with supper.   05/25/2023   metoprolol succinate (TOPROL-XL) 50 MG 24 hr tablet Take 50 mg by mouth daily. Do not take on Monday, Wednesday and Friday on dialysis days   05/25/2023 at am   midodrine (PROAMATINE) 10 MG tablet Take 10 mg by mouth daily. Take on dialysis days, Monday, Wednesday and Friday.   05/23/2023   nitroGLYCERIN (NITROSTAT) 0.4 MG SL tablet Place 1 tablet (0.4 mg total) under the tongue every 5 (five) minutes as needed for chest pain. 20 tablet 1 05/26/2023   potassium citrate (UROCIT-K) 10 MEQ (1080 MG) SR tablet TAKE (1) TABLET BY MOUTH TWICE DAILY WITH A MEAL. 60 tablet 11 05/25/2023   sertraline (ZOLOFT) 50 MG tablet Take 50 mg by mouth at bedtime.   05/25/2023   tamsulosin (FLOMAX) 0.4 MG CAPS capsule TAKE (1) CAPSULE BY MOUTH EVERY DAY 30 capsule 1  05/25/2023   amLODipine (NORVASC) 10 MG tablet Take 10 mg by mouth daily. (Patient not taking: Reported on 05/26/2023)   Not Taking   Continuous Blood Gluc Sensor (FREESTYLE LIBRE 14 DAY SENSOR) MISC by Does not apply route.      GLOBAL INJECT EASE INSULIN SYR 31G X 5/16" 0.5 ML MISC Inject into the skin.  glucose blood (ONETOUCH ULTRA) test strip TEST BLOOD SUGAR 4 TIMES DAILY      Lancets (ONETOUCH DELICA PLUS LANCET30G) MISC USE 1 LANCET 4 TIMES DAILY      spironolactone (ALDACTONE) 50 MG tablet Take 50 mg by mouth daily. (Patient not taking: Reported on 05/26/2023)   Not Taking   torsemide (DEMADEX) 100 MG tablet Take 100 mg by mouth daily. (Patient not taking: Reported on 05/26/2023)   Not Taking   Social History   Socioeconomic History   Marital status: Married    Spouse name: Not on file   Number of children: Not on file   Years of education: Not on file   Highest education level: Not on file  Occupational History   Not on file  Tobacco Use   Smoking status: Never    Passive exposure: Never   Smokeless tobacco: Never  Vaping Use   Vaping status: Never Used  Substance and Sexual Activity   Alcohol use: No   Drug use: Not Currently   Sexual activity: Yes    Birth control/protection: None  Other Topics Concern   Not on file  Social History Narrative   Not on file   Social Determinants of Health   Financial Resource Strain: Low Risk  (12/20/2022)   [facility]   Overall Financial Resource Strain (CARDIA)    Difficulty of Paying Living Expenses: Not hard at all  Food Insecurity: No Food Insecurity (05/26/2023)   Hunger Vital Sign    Worried About Running Out of Food in the Last Year: Never true    Ran Out of Food in the Last Year: Never true  Transportation Needs: No Transportation Needs (05/26/2023)   PRAPARE - Administrator, Civil Service (Medical): No    Lack of Transportation (Non-Medical):  No  Physical Activity: Not on file  Stress: Not on file  Social Connections: Not on file  Intimate Partner Violence: Not At Risk (05/26/2023)   Humiliation, Afraid, Rape, and Kick questionnaire    Fear of Current or Ex-Partner: No    Emotionally Abused: No    Physically Abused: No    Sexually Abused: No    Family History  Problem Relation Age of Onset   Emphysema Mother    COPD Mother    Heart disease Mother    Brain cancer Father      Vitals:   05/26/23 1106 05/26/23 1114 05/26/23 1139 05/26/23 1143  BP:  (!) 179/94 (!) 178/101 (!) 170/93  Pulse:  75 77 74  Resp:  16 18 18   Temp:  98.1 F (36.7 C) 98 F (36.7 C) 98 F (36.7 C)  TempSrc: Oral Oral Oral Oral  SpO2:  96% 94% 95%  Weight:      Height:        PHYSICAL EXAM General: chronically-ill appearing elderly male, well nourished, in acute distress laying in his hospital bed at an incline. HEENT: Normocephalic and atraumatic. Neck: No JVD.   Lungs: Normal respiratory effort on RA. Diminished breath sounds bilaterally and bibasilar crackles.  Heart: HRRR. Normal S1 and S2 without gallops or murmurs.  Abdomen: Non-distended appearing.  Msk: Normal strength and tone for age. Extremities: Warm and well perfused. No clubbing, cyanosis. 2+ pitting edema RLE, trace pitting edema LLE.  Neuro: Alert and oriented X 3. Psych: Answers questions appropriately.   Labs: Basic Metabolic Panel: Recent Labs    05/26/23 0749  NA 137  K  3.8  CL 100  CO2 25  GLUCOSE 220*  BUN 47*  CREATININE 3.35*  CALCIUM 8.3*   Liver Function Tests: Recent Labs    05/26/23 0749  AST 20  ALT 18  ALKPHOS 74  BILITOT 0.5  PROT 6.7  ALBUMIN 2.9*   Recent Labs    05/26/23 0749  LIPASE 41   CBC: Recent Labs    05/26/23 0749  WBC 13.1*  NEUTROABS 10.3*  HGB 10.9*  HCT 33.4*  MCV 91.8  PLT 294   Cardiac Enzymes: Recent Labs    05/26/23 0749 05/26/23 0950  TROPONINIHS 316* 353*   BNP: Recent Labs    05/26/23 0749   BNP 1,047.2*   D-Dimer: No results for input(s): "DDIMER" in the last 72 hours. Hemoglobin A1C: No results for input(s): "HGBA1C" in the last 72 hours. Fasting Lipid Panel: No results for input(s): "CHOL", "HDL", "LDLCALC", "TRIG", "CHOLHDL", "LDLDIRECT" in the last 72 hours. Thyroid Function Tests: No results for input(s): "TSH", "T4TOTAL", "T3FREE", "THYROIDAB" in the last 72 hours.  Invalid input(s): "FREET3" Anemia Panel: No results for input(s): "VITAMINB12", "FOLATE", "FERRITIN", "TIBC", "IRON", "RETICCTPCT" in the last 72 hours.   Radiology: [facility] Chest Port 1 View  Result Date: 05/26/2023 CLINICAL DATA:  Chest pain.  Shortness of breath. EXAM: PORTABLE CHEST 1 VIEW COMPARISON:  12/13/2022. FINDINGS: Low lung volume. There is stable mild increased bronchovascular markings without frank pulmonary edema. There are probable atelectatic changes at the lung bases. Bilateral lung fields are otherwise clear. There is blunting of bilateral costophrenic angles, which may represent small bilateral pleural effusions. Stable moderately enlarged cardio-mediastinal silhouette. No acute osseous abnormalities. The soft tissues are within normal limits. There is right IJ hemodialysis catheter with its tip overlying the cavoatrial junction region. IMPRESSION: *Low lung volume. No frank pulmonary edema. Probable bilateral pleural effusions and atelectatic changes at the lung bases. Right IJ hemodialysis catheter noted. Electronically Signed   By: Jules Schick M.D.   On: 05/26/2023 08:54    ECHO Pending  TELEMETRY reviewed by me 05/26/2023: NSR, rate 70s with some PVCs  EKG reviewed by me: NSR, rate 73 bpm  Data reviewed by me 05/26/2023: last 24h vitals tele labs imaging I/O ED provider note, admission H&P.  Cardiac Cath - 12/31/2021   Mid LAD lesion is 60% stenosed.   Prox RCA lesion is 20% stenosed.   Dist RCA lesion is 20% stenosed.   Ost Cx to Prox Cx lesion is 20% stenosed.   The left  ventricular systolic function is normal.   LV end diastolic pressure is moderately elevated.   The left ventricular ejection fraction is greater than 65% by visual estimate.  Principal Problem:   CHF (congestive heart failure) (HCC) Active Problems:   Acute systolic CHF (congestive heart failure) (HCC)   Acute on chronic diastolic CHF (congestive heart failure) (HCC)   Chronic kidney disease (CKD), stage IV (severe) (HCC)    ASSESSMENT AND PLAN:  James Clarke is a 82 y.o. male  with a past medical history of HFpEF (50-55%, 12/17/2022) aortic atherosclerosis, hypertension, sleep apnea on BiPAP, type 2 diabetes, hyperlipidemia, ESRD on hemodialysis 3x per week (M,W,F) who presented to the ED on 05/26/2023 for chest tightness and shortness of breath. Cardiology was consulted for further evaluation.   # Acute on chronic HFpEF (50-55% - 11/2022) # Demand ischemia # Hypertension Upon admission, patient reports worsening SOB and a few episodes of chest pressure, which was relieved with nitroglycerin. Patient denies worsening LE  edema. Patient states he hasn't had chest discomfort since admission. Troponin trended 316 > 353. Patient shows evidence of fluid overload with LE pitting edema and bilateral pleural effusions on CXR. S/p 1 dose of IV lasix 80 mg in the ED with good UOP. Troponin elevation likely 2/2 acute heart failure exacerbation.  -Echo pending -Continue IV lasix 80 mg BID  -Continue heparin infusion. -Continue home imdur 30 mg and metoprolol succinate 50 mg daily (hold on dialysis days).  # Coronary artery disease # Hyperlipidemia  Patient with non-obstructive CAD by [facility] 12/2021 presenting with episode of CP and mildly elevated and flat troponin. EKG nonacute.  -Continue home pravastatin 40 mg daily and aspirin 81 mg daily.  -No plan for further cardiac diagnostics.   #ESRD on HD (M, W, F)  700 mL UOP today after 1 dose IV lasix 80 mg in the ED. Patient reports no difficulty with  urination. Patient started dialysis 1 month ago, reports having hypotension and hypoglycemia on days he has dialysis. -Monitor BP and BG closely due to recent episodes  -Monitor strict I's & O's, daily weights, and electrolytes -Nephrology following   This patient's plan of care was discussed and created with Dr. Melton Alar and she is in agreement.  Signed: Gale Journey, PA-C  05/26/2023, 3:08 PM [facility]

## 2023-05-26 NOTE — Progress Notes (Addendum)
PHARMACY - ANTICOAGULATION CONSULT NOTE  Pharmacy Consult for Heparin drip Indication: chest pain/ACS  Allergies  Allergen Reactions   Codeine Nausea And Vomiting   Doxycycline    Erythromycin Rash    Patient Measurements: Height: 5\' 10"  (177.8 cm) Weight: (!) 139.6 kg (307 lb 12.8 oz) IBW/kg (Calculated) : 73 Heparin Dosing Weight: 105.8 kg  Vital Signs: Temp: 97.8 F (36.6 C) (11/04 0749) BP: 165/88 (11/04 0830) Pulse Rate: 70 (11/04 0830)  Labs: Recent Labs    05/26/23 0749  HGB 10.9*  HCT 33.4*  PLT 294  CREATININE 3.35*  TROPONINIHS 316*    Estimated Creatinine Clearance: 24 mL/min (A) (by C-G formula based on SCr of 3.35 mg/dL (H)).   Medical History: Past Medical History:  Diagnosis Date   (HFpEF) heart failure with preserved ejection fraction (HCC)    a.) TTE 11/26/2021: EF 45%, mod LVH, post HK, mod LAE, mild MR/TR, G2DD; b.) TTE 02/26/2022: EF 55-60%, BAE, triv MR, G2DD; c.) TTE 07/09/2022: EF 45%, post HK, LVH, mod LAE, triv TR/PR, mild MR, G1DD   Anemia of chronic renal failure    Aortic atherosclerosis (HCC)    Asthma    Bell's palsy    BPH (benign prostatic hyperplasia)    CAD (coronary artery disease)    a.) LHC 12/31/2021: 60% mLAD, 20% pRCA, 20% dRCA, 20% o-pLCx --> med mgmt.   CKD (chronic kidney disease), stage IV (HCC)    Diabetic neuropathy (HCC)    Dyspnea on exertion    Ganglion cyst of wrist, right    a.) s/p excision 04/2011   Hepatic steatosis    History of 2019 novel coronavirus disease (COVID-19)    History of bilateral cataract extraction 2018   Hyperlipidemia    Hyperparathyroidism due to renal insufficiency (HCC)    Hypertension    Low testosterone in male    Nephrolithiasis    Obesity    OSA treated with BiPAP    Osteoarthritis    Peripheral edema    Right inguinal hernia    a.) s/p repair   Skin cancer    Type 2 diabetes mellitus with renal manifestations (HCC)     Medications:  (Not in a hospital admission)   Scheduled:   heparin  4,000 Units Intravenous Once    Assessment: 82 yo M to start Heparin drip for Chest pain, ACS/STEMI. Patient on ASA PTA per Med Rec/review of dispense hx Hgb 10.9  Plt 294  aPTT 32   INR 1.0 Hemodialysis patient   Goal of Therapy:  Heparin level 0.3-0.7 units/ml Monitor platelets by anticoagulation protocol: Yes   Plan:  Give 4000 units bolus x 1 Start heparin infusion at 1400 units/hr Check anti-Xa level in 8 hours and daily while on heparin Continue to monitor H&H and platelets  Bari Mantis PharmD Clinical Pharmacist 05/26/2023

## 2023-05-27 ENCOUNTER — Inpatient Hospital Stay: Payer: PPO

## 2023-05-27 ENCOUNTER — Other Ambulatory Visit (HOSPITAL_COMMUNITY): Payer: Self-pay

## 2023-05-27 DIAGNOSIS — N184 Chronic kidney disease, stage 4 (severe): Secondary | ICD-10-CM | POA: Diagnosis not present

## 2023-05-27 DIAGNOSIS — I5021 Acute systolic (congestive) heart failure: Secondary | ICD-10-CM | POA: Diagnosis not present

## 2023-05-27 LAB — CBC
HCT: 32.2 % — ABNORMAL LOW (ref 39.0–52.0)
Hemoglobin: 10.4 g/dL — ABNORMAL LOW (ref 13.0–17.0)
MCH: 30.1 pg (ref 26.0–34.0)
MCHC: 32.3 g/dL (ref 30.0–36.0)
MCV: 93.1 fL (ref 80.0–100.0)
Platelets: 288 10*3/uL (ref 150–400)
RBC: 3.46 MIL/uL — ABNORMAL LOW (ref 4.22–5.81)
RDW: 14.8 % (ref 11.5–15.5)
WBC: 10.9 10*3/uL — ABNORMAL HIGH (ref 4.0–10.5)
nRBC: 0 % (ref 0.0–0.2)

## 2023-05-27 LAB — GLUCOSE, CAPILLARY
Glucose-Capillary: 135 mg/dL — ABNORMAL HIGH (ref 70–99)
Glucose-Capillary: 145 mg/dL — ABNORMAL HIGH (ref 70–99)
Glucose-Capillary: 116 mg/dL — ABNORMAL HIGH (ref 70–99)
Glucose-Capillary: 269 mg/dL — ABNORMAL HIGH (ref 70–99)

## 2023-05-27 LAB — BASIC METABOLIC PANEL
Anion gap: 11 (ref 5–15)
BUN: 53 mg/dL — ABNORMAL HIGH (ref 8–23)
CO2: 27 mmol/L (ref 22–32)
Calcium: 8.3 mg/dL — ABNORMAL LOW (ref 8.9–10.3)
Chloride: 101 mmol/L (ref 98–111)
Creatinine, Ser: 3.66 mg/dL — ABNORMAL HIGH (ref 0.61–1.24)
GFR, Estimated: 16 mL/min — ABNORMAL LOW (ref >60)
Glucose, Bld: 154 mg/dL — ABNORMAL HIGH (ref 70–99)
Potassium: 3.6 mmol/L (ref 3.5–5.1)
Sodium: 139 mmol/L (ref 135–145)

## 2023-05-27 LAB — BLOOD GAS, VENOUS
Bicarbonate: 29.7 mmol/L — ABNORMAL HIGH (ref 20.0–28.0)
O2 Saturation: 44.4 mmol/L (ref 0.0–2.0)
Patient temperature: 37
Patient temperature: 44.4 %
pCO2, Ven: 49 mm[Hg] (ref 44–60)
pH, Ven: 7.39 (ref 7.25–7.43)
pO2, Ven: <31 mmol/L — ABNORMAL HIGH (ref 32–45)

## 2023-05-27 LAB — HEPATITIS B SURFACE ANTIGEN: Hepatitis B Surface Ag: NONREACTIVE

## 2023-05-27 LAB — HEPARIN LEVEL (UNFRACTIONATED): Heparin Unfractionated: 0.45 [IU]/mL (ref 0.30–0.70)

## 2023-05-27 MED ORDER — ALTEPLASE 2 MG IJ SOLR: 2.0000 mg | Freq: Once | INTRAMUSCULAR | Status: DC | PRN

## 2023-05-27 MED ORDER — HEPARIN SODIUM (PORCINE) 1000 UNIT/ML DIALYSIS: 1000.0000 [IU] | INTRAMUSCULAR | Status: DC | PRN

## 2023-05-27 NOTE — TOC Benefit Eligibility Note (Signed)
Patient Product/process development scientist completed.    The patient is insured through [employer]/ [employer]. Patient has Medicare and is not eligible for a copay card, but may be able to apply for patient assistance, if available.    Ran test claim for Entersto 24-26 mg and the current 30 day co-pay is $47.00.   This test claim was processed through [facility]- copay amounts may vary at other pharmacies due to pharmacy/plan contracts, or as the patient moves through the different stages of their insurance plan.     Roland Earl, CPHT Pharmacy Technician III Certified Patient Advocate [facility] Patient Advocate Team Direct Number: [phone]  Fax: [phone]

## 2023-05-27 NOTE — Progress Notes (Signed)
[facility] CLINIC CARDIOLOGY PROGRESS NOTE       Patient ID: Kyren Knick MRN: [id number] DOB/AGE: 08/24/1940 82 y.o.  Admit date: 05/26/2023 Referring Physician Mikey College, MD Primary Physician Feldpausch, Madaline Guthrie, MD Primary Cardiologist [name], PA-C Reason for Consultation Heart Failure  HPI: Hoke Baer is a 82 y.o. male  with a past medical history of HFpEF (50-55%, 12/17/2022) aortic atherosclerosis, CAD with LHC 12/2021, hypertension, sleep apnea on BiPAP, type 2 diabetes, hyperlipidemia, ESRD on HD (M,W,F) who presented to the ED on 05/26/2023 for chest tightness and shortness of breath. Cardiology was consulted for further evaluation.   Interval history: -Patient reports feeling better overall this AM. States his SOB is mildly improved.  -Walked around unit this AM with PT, did develop SOB with exertion which resolved with rest.  -BP elevated this AM, scheduled for HD later today. Usually holds BP meds on days he has dialysis due to hypotension.   Review of systems complete and found to be negative unless listed above    Past Medical History:  Diagnosis Date   (HFpEF) heart failure with preserved ejection fraction (HCC)    a.) TTE 11/26/2021: EF 45%, mod LVH, post HK, mod LAE, mild MR/TR, G2DD; b.) TTE 02/26/2022: EF 55-60%, BAE, triv MR, G2DD; c.) TTE 07/09/2022: EF 45%, post HK, LVH, mod LAE, triv TR/PR, mild MR, G1DD   Anemia of chronic renal failure    Aortic atherosclerosis (HCC)    Asthma    Bell's palsy    BPH (benign prostatic hyperplasia)    CAD (coronary artery disease)    a.) LHC 12/31/2021: 60% mLAD, 20% pRCA, 20% dRCA, 20% o-pLCx --> med mgmt.   CKD (chronic kidney disease), stage IV (HCC)    Diabetic neuropathy (HCC)    Dyspnea on exertion    Ganglion cyst of wrist, right    a.) s/p excision 04/2011   Hepatic steatosis    History of 2019 novel coronavirus disease (COVID-19)    History of bilateral cataract extraction 2018   Hyperlipidemia     Hyperparathyroidism due to renal insufficiency (HCC)    Hypertension    Low testosterone in male    Nephrolithiasis    Obesity    OSA treated with BiPAP    Osteoarthritis    Peripheral edema    Right inguinal hernia    a.) s/p repair   Skin cancer    Type 2 diabetes mellitus with renal manifestations [facility])     Past Surgical History:  Procedure Laterality Date   A/V FISTULAGRAM Left 01/13/2023   Procedure: A/V Fistulagram;  Surgeon: Annice Needy, MD;  Location: [facility] INVASIVE CV LAB;  Service: Cardiovascular;  Laterality: Left;   A/V SHUNT INTERVENTION Left 04/11/2023   Procedure: A/V SHUNT INTERVENTION;  Surgeon: Annice Needy, MD;  Location: [facility] INVAS[facility] CV [facility];  Service: Cardiovascular;  Laterality: Left;   APPENDECTOMY     AV FISTULA PLACEMENT Left 09/19/2022   Procedure: ARTERIOVENOUS (AV) FISTULA CREATION (RADIO CEPHALIC);  Surgeon: Annice Needy, MD;  Location: [facility] ORS;  Service: Vascular;  Laterality: Left;   CATARACT EXTRACTION W/PHACO Left 05/20/2017   Procedure: CATARACT EXTRACTION PHACO AND INTRAOCULAR LENS PLACEMENT (IOC);  Surgeon: Galen Manila, MD;  Location: [facility] ORS;  Service: Ophthalmology;  Laterality: Left;  [place] 00:32.0 AP% 15.5 CDE 4.97 Fluid Pack Lot # [id number]   CATARACT EXTRACTION W/PHACO Right 06/10/2017   Procedure: CATARACT EXTRACTION PHACO AND INTRAOCULAR LENS PLACEMENT (IOC);  Surgeon: Galen Manila, MD;  Location: [facility] ORS;  Service: Ophthalmology;  Laterality: Right;  [place]  00:51 AP% 15.4 CDE 7.95 Fluid pack lot # [id number]   CHOLECYSTECTOMY     COLONOSCOPY     x3   CYSTOSCOPY W/ RETROGRADES Right 08/13/2019   Procedure: CYSTOSCOPY WITH RETROGRADE PYELOGRAM;  Surgeon: Sondra Come, MD;  Location: [facility] ORS;  Service: Urology;  Laterality: Right;   CYSTOSCOPY WITH STENT PLACEMENT Right 07/26/2019   Procedure: CYSTOSCOPY WITH STENT PLACEMENT;  Surgeon: Crista Elliot, MD;  Location: [facility] ORS;  Service: Urology;  Laterality: Right;    CYSTOSCOPY/URETEROSCOPY/HOLMIUM LASER/STENT PLACEMENT Right 08/13/2019   Procedure: CYSTOSCOPY/URETEROSCOPY/HOLMIUM LASER/STENT EXCHANGE;  Surgeon: Sondra Come, MD;  Location: [facility] OR[facility];  Service: Urology;  Laterality: Right;   CYSTOSCOPY/URETEROSCOPY/HOLMIUM LASER/STENT PLACEMENT Left 02/27/2022   Procedure: CYSTOSCOPY/URETEROSCOPY/HOLMIUM LASER/STENT PLACEMENT;  Surgeon: Riki Altes, MD;  Location: [facility] OR[facility];  Service: Urology;  Laterality: Left;   DIALYSIS/PERMA CATHETER INSERTION N/A 04/11/2023   Procedure: DIALYSIS/PERMA CATHETER INSERTION;  Surgeon: Annice Needy, MD;  Location: [facility];  Service: Cardiovascular;  Laterality: N/A;   GANGLION CYST EXCISION Right    INGUINAL HERNIA REPAIR Right    LEFT HEART CATH AND CORONARY ANGIOGRAPHY N/A 12/31/2021   Procedure: LEFT HEART CATH AND CORONARY ANGIOGRAPHY;  Surgeon: Lamar Blinks, MD;  Location: [facility];  Service: Cardiovascular;  Laterality: N/A;   SHOULDER ARTHROSCOPY WITH SUBACROMIAL DECOMPRESSION AND OPEN ROTATOR C Right 09/11/2020   Procedure: Right shoulder arthroscopic rotator cuff repair vs Regeneten patch application, subacromial decompression, and biceps tenodesis - Dedra Skeens to Assist;  Surgeon: Signa Kell, MD;  Location: [facility] OR[facility];  Service: Orthopedics;  Laterality: Right;   TEMPORARY DIALYSIS CATHETER N/A 03/05/2022   Procedure: TEMPORARY DIALYSIS CATHETER;  Surgeon: Renford Dills, MD;  Location: [facility];  Service: Cardiovascular;  Laterality: N/A;   TONSILLECTOMY     URETEROSCOPY WITH HOLMIUM LASER LITHOTRIPSY Left 02/27/2022   Procedure: URETEROSCOPY WITH HOLMIUM LASER LITHOTRIPSY;  Surgeon: Riki Altes, MD;  Location: [facility] OR[facility];  Service: Urology;  Laterality: Left;   WRIST FRACTURE SURGERY Right     Medications Prior to Admission  Medication Sig Dispense Refill Last Dose   acetaminophen (TYLENOL) 500 MG tablet Take 1,000 mg by mouth every 6 (six) hours as needed  (shoulder pain).   prn at unk   albuterol (PROVENTIL HFA;VENTOLIN HFA) 108 (90 Base) MCG/ACT inhaler Inhale 2 puffs into the lungs every 6 (six) hours as needed for wheezing or shortness of breath.   prn at unk   aspirin EC 81 MG tablet Take by mouth.   05/25/2023   calcitRIOL (ROCALTROL) 0.25 MCG capsule Take 0.25 mcg by mouth daily.   05/25/2023   cetirizine (ZYRTEC) 10 MG chewable tablet Chew 10 mg by mouth daily as needed for allergies.   05/25/2023   diazepam (VALIUM) 2 MG tablet Take 2 mg by mouth every 12 (twelve) hours as needed.   prn at unk   fluticasone (FLONASE) 50 MCG/ACT nasal spray as needed.   prn at unk   furosemide (LASIX) 40 MG tablet Take 80 mg q am and 40 mg qpm (Patient taking differently: Take 80 mg by mouth 2 (two) times daily. On non dialysis days, Sunday, Tuesday, Thursday, Saturday and Sunday) 60 tablet 2 05/25/2023   insulin aspart (NOVOLOG) 100 UNIT/ML injection Inject 14 Units into the skin 3 (three) times daily with meals. (Patient taking differently: Inject 35 Units into the skin 3 (three)  times daily with meals.) 10 mL 11 05/25/2023 at pm   insulin detemir (LEVEMIR) 100 UNIT/ML injection Inject 0.5 mLs (50 Units total) into the skin at bedtime. (Patient taking differently: Inject 35 Units into the skin at bedtime.) 10 mL 1 05/25/2023   isosorbide mononitrate (IMDUR) 30 MG 24 hr tablet Take 1 tablet (30 mg total) by mouth daily. 30 tablet 2 05/25/2023   lovastatin (MEVACOR) 40 MG tablet Take 40 mg by mouth daily with supper.   05/25/2023   metoprolol succinate (TOPROL-XL) 50 MG 24 hr tablet Take 50 mg by mouth daily. Do not take on Monday, Wednesday and Friday on dialysis days   05/25/2023 at am   midodrine (PROAMATINE) 10 MG tablet Take 10 mg by mouth daily. Take on dialysis days, Monday, Wednesday and Friday.   05/23/2023   nitroGLYCERIN (NITROSTAT) 0.4 MG SL tablet Place 1 tablet (0.4 mg total) under the tongue every 5 (five) minutes as needed for chest pain. 20 tablet 1  05/26/2023   potassium citrate (UROCIT-K) 10 MEQ (1080 MG) SR tablet TAKE (1) TABLET BY MOUTH TWICE DAILY WITH A MEAL. 60 tablet 11 05/25/2023   sertraline (ZOLOFT) 50 MG tablet Take 50 mg by mouth at bedtime.   05/25/2023   tamsulosin (FLOMAX) 0.4 MG CAPS capsule TAKE (1) CAPSULE BY MOUTH EVERY DAY 30 capsule 1 05/25/2023   amLODipine (NORVASC) 10 MG tablet Take 10 mg by mouth daily. (Patient not taking: Reported on 05/26/2023)   Not Taking   Continuous Blood Gluc Sensor (FREESTYLE LIBRE 14 DAY SENSOR) MISC by Does not apply route.      GLOBAL INJECT EASE INSULIN SYR 31G X 5/16" 0.5 ML MISC Inject into the skin.      glucose blood (ONETOUCH ULTRA) test strip TEST BLOOD SUGAR 4 TIMES DAILY      Lancets (ONETOUCH DELICA PLUS LANCET30G) MISC USE 1 LANCET 4 TIMES DAILY      spironolactone (ALDACTONE) 50 MG tablet Take 50 mg by mouth daily. (Patient not taking: Reported on 05/26/2023)   Not Taking   torsemide (DEMADEX) 100 MG tablet Take 100 mg by mouth daily. (Patient not taking: Reported on 05/26/2023)   Not Taking   Social History   Socioeconomic History   Marital status: Married    Spouse name: Not on file   Number of children: Not on file   Years of education: Not on file   Highest education level: Not on file  Occupational History   Not on file  Tobacco Use   Smoking status: Never    Passive exposure: Never   Smokeless tobacco: Never  Vaping Use   Vaping status: Never Used  Substance and Sexual Activity   Alcohol use: No   Drug use: Not Currently   Sexual activity: Yes    Birth control/protection: None  Other Topics Concern   Not on file  Social History Narrative   Not on file   Social Determinants of Health   Financial Resource Strain: Low Risk  (12/20/2022)   Received from [facility]   Overall Financial Resource Strain (CARDIA)    Difficulty of Paying Living Expenses: Not hard at all  Food Insecurity: No Food Insecurity  (05/26/2023)   Hunger Vital Sign    Worried About Running Out of Food in the Last Year: Never true    Ran Out of Food in the Last Year: Never true  Transportation Needs: No Transportation Needs (05/26/2023)   PRAPARE -  Administrator, Civil Service (Medical): No    Lack of Transportation (Non-Medical): No  Physical Activity: Not on file  Stress: Not on file  Social Connections: Not on file  Intimate Partner Violence: Not At Risk (05/26/2023)   Humiliation, Afraid, Rape, and Kick questionnaire    Fear of Current or Ex-Partner: No    Emotionally Abused: No    Physically Abused: No    Sexually Abused: No    Family History  Problem Relation Age of Onset   Emphysema Mother    COPD Mother    Heart disease Mother    Brain cancer Father      Vitals:   05/26/23 2313 05/27/23 0340 05/27/23 0500 05/27/23 0809  BP: (!) 142/74 (!) 144/74  (!) 162/73  Pulse: 64 65  65  Resp: 18 19  16   Temp: 98.1 F (36.7 C) 98 F (36.7 C)  98.3 F (36.8 C)  TempSrc:      SpO2: 96% 96%  94%  Weight:   (!) 137.8 kg   Height:        PHYSICAL EXAM General: chronically-ill appearing elderly male, well nourished, in acute distress laying in his hospital bed at an incline. HEENT: Normocephalic and atraumatic. Neck: No JVD.   Lungs: Normal respiratory effort on RA. Diminished breath sounds bilaterally and mild bibasilar crackles.  Heart: HRRR. Normal S1 and S2 without gallops or murmurs.  Abdomen: Non-distended appearing.  Msk: Normal strength and tone for age. Extremities: Warm and well perfused. No clubbing, cyanosis. 1+ pitting edema.  Neuro: Alert and oriented X 3. Psych: Answers questions appropriately.   Labs: Basic Metabolic Panel: Recent Labs    05/26/23 0749 05/27/23 0416  NA 137 139  K 3.8 3.6  CL 100 101  CO2 25 27  GLUCOSE 220* 154*  BUN 47* 53*  CREATININE 3.35* 3.66*  CALCIUM 8.3* 8.3*   Liver Function Tests: Recent Labs    05/26/23 0749  AST 20  ALT 18   ALKPHOS 74  BILITOT 0.5  PROT 6.7  ALBUMIN 2.9*   Recent Labs    05/26/23 0749  LIPASE 41   CBC: Recent Labs    05/26/23 0749 05/27/23 0416  WBC 13.1* 10.9*  NEUTROABS 10.3*  --   HGB 10.9* 10.4*  HCT 33.4* 32.2*  MCV 91.8 93.1  PLT 294 288   Cardiac Enzymes: Recent Labs    05/26/23 0749 05/26/23 0950 05/26/23 1525  TROPONINIHS 316* 353* 462*   BNP: Recent Labs    05/26/23 0749  BNP 1,047.2*   D-Dimer: No results for input(s): "DDIMER" in the last 72 hours. Hemoglobin A1C: Recent Labs    05/26/23 0807  HGBA1C 7.6*   Fasting Lipid Panel: No results for input(s): "CHOL", "HDL", "LDLCALC", "TRIG", "CHOLHDL", "LDLDIRECT" in the last 72 hours. Thyroid Function Tests: No results for input(s): "TSH", "T4TOTAL", "T3FREE", "THYROIDAB" in the last 72 hours.  Invalid input(s): "FREET3" Anemia Panel: No results for input(s): "VITAMINB12", "FOLATE", "FERRITIN", "TIBC", "IRON", "RETICCTPCT" in the last 72 hours.   Radiology: DG Chest 1 View  Result Date: 05/27/2023 CLINICAL DATA:  CHF. EXAM: CHEST  1 VIEW COMPARISON:  05/26/2023 FINDINGS: The cardio pericardial silhouette is enlarged. Stable chronic interstitial changes at the lung bases. Right central line tip overlies the inferior SVC level. No acute bony abnormality. Telemetry leads overlie the chest. IMPRESSION: 1. No acute cardiopulmonary findings. 2. Right central line tip overlies the inferior SVC level. Electronically Signed   By: Minerva Areola  Molli Posey M.D.   On: 05/27/2023 07:48   ECHOCARDIOGRAM COMPLETE  Result Date: 05/26/2023    ECHOCARDIOGRAM REPORT   Patient Name:   HANI GENE Klute Date of Exam: 05/26/2023 Medical Rec #:  [id number]       Height:       70.0 in Accession #:    [id number]      Weight:       307.8 lb Date of Birth:  09-20-1940      BSA:          2.507 m Patient Age:    81 years        BP:           170/93 mmHg Patient Gender: M               HR:           71 bpm. Exam Location:  [facility] Procedure: 2D  Echo, Cardiac Doppler, Color Doppler and Intracardiac            Opacification Agent Indications:     CHF  History:         Patient has prior history of Echocardiogram examinations, most                  recent 12/17/2022. CHF, CAD, Signs/Symptoms:Edema; Risk                  Factors:Hypertension, Diabetes, Dyslipidemia and Sleep Apnea.                  ESRD on Dialysis.  Sonographer:     Mikki Harbor Referring Phys:  [id number] Emeline General Diagnosing Phys: Lorine Bears MD  Sonographer Comments: Technically difficult study due to poor echo windows and patient is obese. IMPRESSIONS  1. Left ventricular ejection fraction, by estimation, is 30 to 35%. The left ventricle has moderately decreased function. Left ventricular endocardial border not optimally defined to evaluate regional wall motion. The left ventricular internal cavity size was mildly dilated. There is moderate left ventricular hypertrophy. Left ventricular diastolic parameters are indeterminate.  2. Right ventricular systolic function is normal. The right ventricular size is normal. Tricuspid regurgitation signal is inadequate for assessing PA pressure.  3. Left atrial size was moderately dilated.  4. Right atrial size was mildly dilated.  5. The mitral valve is normal in structure. Mild mitral valve regurgitation. No evidence of mitral stenosis.  6. The aortic valve is normal in structure. Aortic valve regurgitation is not visualized. Aortic valve sclerosis is present, with no evidence of aortic valve stenosis. Comparison(s): A prior study was performed on 11/17/22. Changes from prior study are noted. The left ventricular function is significantly worse. FINDINGS  Left Ventricle: Left ventricular ejection fraction, by estimation, is 30 to 35%. The left ventricle has moderately decreased function. Left ventricular endocardial border not optimally defined to evaluate regional wall motion. Definity contrast agent was given IV to delineate the left  ventricular endocardial borders. The left ventricular internal cavity size was mildly dilated. There is moderate left ventricular hypertrophy. Left ventricular diastolic parameters are indeterminate. Right Ventricle: The right ventricular size is normal. No increase in right ventricular wall thickness. Right ventricular systolic function is normal. Tricuspid regurgitation signal is inadequate for assessing PA pressure. The tricuspid regurgitant velocity is 2.18 m/s, and with an assumed right atrial pressure of 3 mmHg, the estimated right ventricular systolic pressure is 22.0 mmHg. Left Atrium: Left atrial size was moderately dilated. Right Atrium: Right atrial size was  mildly dilated. Pericardium: Trivial pericardial effusion is present. Mitral Valve: The mitral valve is normal in structure. Mild mitral valve regurgitation. No evidence of mitral valve stenosis. MV peak gradient, 3.2 mmHg. The mean mitral valve gradient is 1.0 mmHg. Tricuspid Valve: The tricuspid valve is normal in structure. Tricuspid valve regurgitation is trivial. No evidence of tricuspid stenosis. Aortic Valve: The aortic valve is normal in structure. Aortic valve regurgitation is not visualized. Aortic valve sclerosis is present, with no evidence of aortic valve stenosis. Aortic valve mean gradient measures 3.0 mmHg. Aortic valve peak gradient measures 8.6 mmHg. Aortic valve area, by VTI measures 1.95 cm. Pulmonic Valve: The pulmonic valve was normal in structure. Pulmonic valve regurgitation is not visualized. No evidence of pulmonic stenosis. Aorta: The aortic root is normal in size and structure. IAS/Shunts: No atrial level shunt detected by color flow Doppler.  LEFT VENTRICLE PLAX 2D LVIDd:         5.70 cm   Diastology LVIDs:         5.20 cm   LV e' medial:    5.22 cm/s LV PW:         1.20 cm   LV E/e' medial:  12.2 LV IVS:        1.40 cm   LV e' lateral:   5.00 cm/s LVOT diam:     2.10 cm   LV E/e' lateral: 12.7 LV SV:         67 LV SV  Index:   27 LVOT Area:     3.46 cm  RIGHT VENTRICLE RV Basal diam:  4.50 cm RV Mid diam:    4.60 cm RV S prime:     16.80 cm/s LEFT ATRIUM              Index        RIGHT ATRIUM           Index LA diam:        4.10 cm  1.64 cm/m   RA Area:     23.70 cm LA Vol (A2C):   117.0 ml 46.67 ml/m  RA Volume:   74.20 ml  29.60 ml/m LA Vol (A4C):   86.7 ml  34.58 ml/m LA Biplane Vol: 110.0 ml 43.88 ml/m  AORTIC VALVE                    PULMONIC VALVE AV Area (Vmax):    2.40 cm     PV Vmax:       1.06 m/s AV Area (Vmean):   2.78 cm     PV Peak grad:  4.5 mmHg AV Area (VTI):     1.95 cm AV Vmax:           147.00 cm/s AV Vmean:          80.600 cm/s AV VTI:            0.342 m AV Peak Grad:      8.6 mmHg AV Mean Grad:      3.0 mmHg LVOT Vmax:         102.00 cm/s LVOT Vmean:        64.800 cm/s LVOT VTI:          0.193 m LVOT/AV VTI ratio: 0.56  AORTA Ao Root diam: 3.80 cm MITRAL VALVE               TRICUSPID VALVE MV Area (PHT): 2.91 cm    TR Peak grad:  19.0 mmHg MV Area VTI:   2.65 cm    TR Vmax:        218.00 cm/s MV Peak grad:  3.2 mmHg MV Mean grad:  1.0 mmHg    SHUNTS MV Vmax:       0.89 m/s    Systemic VTI:  0.19 m MV Vmean:      49.9 cm/s   Systemic Diam: 2.10 cm MV Decel Time: 261 msec MV E velocity: 63.70 cm/s MV A velocity: 44.00 cm/s MV E/A ratio:  1.45 Lorine Bears MD Electronically signed by Lorine Bears MD Signature Date/Time: 05/26/2023/5:43:57 PM    Final    DG Chest Port 1 View  Result Date: 05/26/2023 CLINICAL DATA:  Chest pain.  Shortness of breath. EXAM: PORTABLE CHEST 1 VIEW COMPARISON:  12/13/2022. FINDINGS: Low lung volume. There is stable mild increased bronchovascular markings without frank pulmonary edema. There are probable atelectatic changes at the lung bases. Bilateral lung fields are otherwise clear. There is blunting of bilateral costophrenic angles, which may represent small bilateral pleural effusions. Stable moderately enlarged cardio-mediastinal silhouette. No acute osseous  abnormalities. The soft tissues are within normal limits. There is right IJ hemodialysis catheter with its tip overlying the cavoatrial junction region. IMPRESSION: *Low lung volume. No frank pulmonary edema. Probable bilateral pleural effusions and atelectatic changes at the lung bases. Right IJ hemodialysis catheter noted. Electronically Signed   By: Jules Schick M.D.   On: 05/26/2023 08:54    ECHO Pending  TELEMETRY reviewed by me 05/27/2023: sinus rhythm rate 70s  EKG reviewed by me: NSR, rate 73 bpm  Data reviewed by me 05/27/2023: last 24h vitals tele labs imaging I/O ED provider note, admission H&P.  Cardiac Cath - 12/31/2021   Mid LAD lesion is 60% stenosed.   Prox RCA lesion is 20% stenosed.   Dist RCA lesion is 20% stenosed.   Ost Cx to Prox Cx lesion is 20% stenosed.   The left ventricular systolic function is normal.   LV end diastolic pressure is moderately elevated.   The left ventricular ejection fraction is greater than 65% by visual estimate.  Principal Problem:   CHF (congestive heart failure) (HCC) Active Problems:   Acute systolic CHF (congestive heart failure) (HCC)   Acute on chronic diastolic CHF (congestive heart failure) (HCC)   Chronic kidney disease (CKD), stage IV (severe) (HCC)    ASSESSMENT AND PLAN:  Tayvian Holycross is a 82 y.o. male  with a past medical history of HFpEF (50-55%, 12/17/2022) aortic atherosclerosis, hypertension, sleep apnea on BiPAP, type 2 diabetes, hyperlipidemia, ESRD on hemodialysis 3x per week (M,W,F) who presented to the ED on 05/26/2023 for chest tightness and shortness of breath. Cardiology was consulted for further evaluation.   # Acute on chronic HFpEF (50-55% - 11/2022) # Demand ischemia # Hypertension Upon admission, patient reports worsening SOB and a few episodes of chest pressure associated with SOB. Patient denies worsening LE edema. Troponin trended 316 > 353. Patient with evidence of fluid overload with LE pitting edema  and bilateral pleural effusions on CXR. S/p 1 dose of IV lasix 80 mg in the ED with good UOP. Troponin elevation likely 2/2 acute heart failure exacerbation. Echo this admission with EF 30-35%.  -Continue lasix 80 mg BID. Was changed to po by primary. Can consider additional IV if still volume up after HD. -Discontinue heparin. -Continue home imdur 30 mg and metoprolol succinate 50 mg daily (holds on dialysis days). Will consider further additions to  GDMT pending BP after dialysis.  # Coronary artery disease # Hyperlipidemia  Patient with non-obstructive CAD by [facility] 12/2021 presenting with episode of CP and mildly elevated and flat troponin. EKG nonacute.  -Continue home pravastatin 40 mg daily and aspirin 81 mg daily.  -No plan for further cardiac diagnostics at this time.   #ESRD on HD (M, W, F)  Patient started dialysis 1 month ago, reports having hypotension and hypoglycemia on days he has dialysis. -Monitor BP and BG closely due to recent episodes  -Monitor strict I's & O's, daily weights, and electrolytes -Nephrology following   This patient's plan of care was discussed and created with Dr. Melton Alar and she is in agreement.  Signed: Gale Journey, PA-C  05/27/2023, 11:14 AM [facility]

## 2023-05-27 NOTE — Evaluation (Signed)
Occupational Therapy Evaluation Patient Details Name: James Clarke MRN: [id number] DOB: 10/25/1940 Today's Date: 05/27/2023   History of Present Illness Patient is a 82 year old male with worsening shortness of breath, chest tightness, CHF. History of ESRD on hemodialysis   Clinical Impression   Chart reviewed prior to eval. Pt was seen for OT/PT evaluation this date. Prior to hospital admission, Pt was MOD I - indep for mobility and ADLs/IADLs, using a SPC for out of home only. Pt lives with his wife in one level home, his wife assist him minimally with LB dressing, and bathing due to his port. Pt presents to acute OT demonstrating impaired ADL performance and functional mobility 2/2 (See OT problem list for additional functional deficits). Pt currently requires CGA for bed mobility, standing grooming tasks, and amb with RW. Pt amb 1 lap in hallway with RW + CGA, approx 163ft around the nurses station. Pt completed oral care at sink level, with CGA, reliant on sink for balance in standing. HR reached 100bpm during mobility, decreased when taking standing break. Pt visibally having SOB while returning to supine, stated it was the hardest part for him. Pt would benefit from skilled OT services to address noted impairments and functional limitations (see below for any additional details) in order to maximize safety and independence while minimizing falls risk and caregiver burden.       If plan is discharge home, recommend the following: A little help with walking and/or transfers;A little help with bathing/dressing/bathroom;Help with stairs or ramp for entrance;Assist for transportation;Assistance with cooking/housework    Functional Status Assessment  Patient has had a recent decline in their functional status and demonstrates the ability to make significant improvements in function in a reasonable and predictable amount of time.  Equipment Recommendations  None recommended by OT     Recommendations for Other Services       Precautions / Restrictions Precautions Precautions: Fall Restrictions Weight Bearing Restrictions: No      Mobility Bed Mobility Overal bed mobility: Needs Assistance Bed Mobility: Supine to Sit, Sit to Supine     Supine to sit: Contact guard Sit to supine: Contact guard assist        Transfers Overall transfer level: Needs assistance Equipment used: Rolling walker (2 wheels) Transfers: Sit to/from Stand Sit to Stand: Contact guard assist                  Balance Overall balance assessment: Needs assistance Sitting-balance support: Feet supported, No upper extremity supported Sitting balance-Leahy Scale: Good     Standing balance support: During functional activity, Bilateral upper extremity supported, Reliant on assistive device for balance Standing balance-Leahy Scale: Fair                             ADL either performed or assessed with clinical judgement   ADL Overall ADL's : Needs assistance/impaired     Grooming: Oral care;Wash/dry face;Standing;Contact guard assist Grooming Details (indicate cue type and reason): sink level             Lower Body Dressing: Maximal assistance;Bed level Lower Body Dressing Details (indicate cue type and reason): Donn/doff socks Toilet Transfer: Contact guard assist;Ambulation;Rolling walker (2 wheels) Toilet Transfer Details (indicate cue type and reason): simulated         Functional mobility during ADLs: Contact guard assist;Rolling walker (2 wheels)       Vision Baseline Vision/History: 1 Wears glasses  Perception         Praxis         Pertinent Vitals/Pain Pain Assessment Pain Assessment: Faces Faces Pain Scale: No hurt     Extremity/Trunk Assessment Upper Extremity Assessment Upper Extremity Assessment: Generalized weakness   Lower Extremity Assessment Lower Extremity Assessment: Generalized weakness        Communication Communication Communication: No apparent difficulties Cueing Techniques: Verbal cues   Cognition Arousal: Alert Behavior During Therapy: WFL for tasks assessed/performed Overall Cognitive Status: Within Functional Limits for tasks assessed                                       General Comments  Amb ~180ft in hallway with RW + CGA. Blood sugar dropped slightly, HR reached 100bpm during mobility but resolved at standing rest break.    Exercises Other Exercises Other Exercises: Edu: Role of OT, safe ADL completion, energy conservation techniques, safe DME use during functional mobility   Shoulder Instructions      Home Living Family/patient expects to be discharged to:: Private residence Living Arrangements: Spouse/significant other Available Help at Discharge: Family;Available 24 hours/day Type of Home: House Home Access: Stairs to enter [employer] of Steps: 3-4 Entrance Stairs-Rails: Left Home Layout: One level     Bathroom Shower/Tub: Producer, television/film/video: Handicapped height Bathroom Accessibility: Yes   Home Equipment: Cane - single point;Grab bars - tub/shower;Shower seat - built in;Other (comment);Hand held shower head;Rolling Walker (2 wheels)          Prior Functioning/Environment Prior Level of Function : Independent/Modified Independent             Mobility Comments: Uses SPC in community and outside of home ADLs Comments: Wife min assist for LB dressing and bathing to avoiding getting port wet, no driving        OT Problem List: Decreased activity tolerance;Decreased knowledge of use of DME or AE;Cardiopulmonary status limiting activity;Decreased strength;Decreased safety awareness      OT Treatment/Interventions: Self-care/ADL training;Therapeutic exercise;Therapeutic activities;Energy conservation;DME and/or AE instruction;Patient/family education;Balance training    OT Goals(Current goals can  be found in the care plan section) Acute Rehab OT Goals Patient Stated Goal: to return to PLOF OT Goal Formulation: With patient/family Time For Goal Achievement: 06/10/23 Potential to Achieve Goals: Fair ADL Goals Pt Will Perform Grooming: with modified independence;standing Pt Will Perform Lower Body Dressing: with min assist;sit to/from stand Pt Will Transfer to Toilet: with modified independence;ambulating;regular height toilet Pt Will Perform Toileting - Clothing Manipulation and hygiene: with modified independence;sitting/lateral leans  OT Frequency: Min 1X/week    Co-evaluation PT/OT/SLP Co-Evaluation/Treatment: Yes Reason for Co-Treatment: Complexity of the patient's impairments (multi-system involvement);For patient/therapist safety PT goals addressed during session: Mobility/safety with mobility OT goals addressed during session: ADL's and self-care;Proper use of Adaptive equipment and DME      AM-PAC OT "6 Clicks" Daily Activity     Outcome Measure Help from another person eating meals?: None Help from another person taking care of personal grooming?: A Little Help from another person toileting, which includes using toliet, bedpan, or urinal?: A Little Help from another person bathing (including washing, rinsing, drying)?: A Little Help from another person to put on and taking off regular upper body clothing?: A Little Help from another person to put on and taking off regular lower body clothing?: A Lot 6 Click Score: 18   End of  Session Equipment Utilized During Treatment: Rolling walker (2 wheels) Nurse Communication: Mobility status  Activity Tolerance: Patient tolerated treatment well Patient left: in bed;with call bell/phone within reach;with bed alarm set;with family/visitor present  OT Visit Diagnosis: Other abnormalities of gait and mobility (R26.89);Muscle weakness (generalized) (M62.81)                Time: 1324-4010 OT Time Calculation (min): 23  min Charges:  OT General Charges $OT Visit: 1 Visit OT Evaluation $OT Eval Moderate Complexity: 1 Mod  [employer], OTS

## 2023-05-27 NOTE — Progress Notes (Signed)
[facility]  ROUNDING NOTE   Subjective:   James Clarke is a 82 year old male well-known to our practice.  Medical history consistent with hypertension, CHF, nonobstructive CAD, IDDM with diabetic nephropathy, obesity, BPH, and end-stage renal disease on hemodialysis.  Patient presents to the emergency room from home complaining of chest pain and shortness of breath and has been admitted for CHF (congestive heart failure) (HCC) [I50.9] NSTEMI (non-ST elevated myocardial infarction) (HCC) [I21.4] Acute on chronic congestive heart failure, unspecified heart failure type [facility]) [I50.9]  Patient is known to our practice and receives outpatient dialysis treatments at [facility] on a MWF schedule, supervised by Dr. Cherylann Ratel.    Patient sitting up in bed Denies chest pain or discomfort Remains on room air  Scheduled for dialysis later today   Objective:  Vital signs in last 24 hours:  Temp:  [98 F (36.7 C)-98.3 F (36.8 C)] 98.1 F (36.7 C) (11/05 1158) Pulse Rate:  [64-90] 69 (11/05 1445) Resp:  [14-26] 17 (11/05 1445) BP: (142-174)/(64-88) 150/79 (11/05 1445) SpO2:  [94 %-100 %] 100 % (11/05 1445) FiO2 (%):  [21 %] 21 % (11/04 2058) Weight:  [136.4 kg-137.8 kg] 136.4 kg (11/05 1158)  Weight change:  Filed Weights   05/26/23 0752 05/27/23 0500 05/27/23 1158  Weight: (!) 139.6 kg (!) 137.8 kg (!) 136.4 kg    Intake/Output: I/O last 3 completed shifts: In: 454 [P.O.:360; I.V.:94] Out: 1650 [Urine:1650]   Intake/Output this shift:  Total I/O In: 50 [P.O.:50] Out: -   Physical Exam: General: NAD  Head: Normocephalic, atraumatic. Moist oral mucosal membranes  Eyes: Anicteric  Lungs:  Clear to auscultation, normal effort, room air  Heart: Regular rate and rhythm  Abdomen:  Soft, nontender, obese  Extremities: No peripheral edema.  Neurologic: Alert and oriented, moving all four extremities  Skin: No lesions  Access: Right chest PermCath    Basic  Metabolic Panel: Recent Labs  Lab 05/26/23 0749 05/27/23 0416  NA 137 139  K 3.8 3.6  CL 100 101  CO2 25 27  GLUCOSE 220* 154*  BUN 47* 53*  CREATININE 3.35* 3.66*  CALCIUM 8.3* 8.3*    Liver Function Tests: Recent Labs  Lab 05/26/23 0749  AST 20  ALT 18  ALKPHOS 74  BILITOT 0.5  PROT 6.7  ALBUMIN 2.9*   Recent Labs  Lab 05/26/23 0749  LIPASE 41   No results for input(s): "AMMONIA" in the last 168 hours.  CBC: Recent Labs  Lab 05/26/23 0749 05/27/23 0416  WBC 13.1* 10.9*  NEUTROABS 10.3*  --   HGB 10.9* 10.4*  HCT 33.4* 32.2*  MCV 91.8 93.1  PLT 294 288    Cardiac Enzymes: No results for input(s): "CKTOTAL", "CKMB", "CKMBINDEX", "TROPONINI" in the last 168 hours.  BNP: Invalid input(s): "POCBNP"  CBG: Recent Labs  Lab 05/26/23 1245 05/26/23 1601 05/26/23 2124 05/27/23 0816 05/27/23 1127  GLUCAP 208* 197* 204* 135* 145*    Microbiology: Results for orders placed or performed during the hospital encounter of 05/26/23  MRSA Next Gen by PCR, Nasal     Status: Abnormal   Collection Time: 05/26/23  1:35 PM   Specimen: Nasal Mucosa; Nasal Swab  Result Value Ref Range Status   MRSA by PCR Next Gen DETECTED (A) NOT DETECTED Final    Comment: RESULT CALLED TO, READ BACK BY AND VERIFIED WITH:  PRAWEENA BHUSAL 05/26/2023 1748 CP (NOTE) The GeneXpert MRSA Assay (FDA approved for NASAL specimens only), is one component  of a comprehensive MRSA colonization surveillance program. It is not intended to diagnose MRSA infection nor to guide or monitor treatment for MRSA infections. Test performance is not FDA approved in patients less than 4 years old. Performed at [facility], [address] [place], [place] [address]     Coagulation Studies: Recent Labs    05/26/23 0807  LABPROT 13.7  INR 1.0    Urinalysis: No results for input(s): "COLORURINE", "LABSPEC", "PHURINE", "GLUCOSEU", "HGBUR", "BILIRUBINUR", "KETONESUR", "PROTEINUR",  "UROBILINOGEN", "NITRITE", "LEUKOCYTESUR" in the last 72 hours.  Invalid input(s): "APPERANCEUR"    Imaging: DG Chest 1 View  Result Date: 05/27/2023 CLINICAL DATA:  CHF. EXAM: CHEST  1 VIEW COMPARISON:  05/26/2023 FINDINGS: The cardio pericardial silhouette is enlarged. Stable chronic interstitial changes at the lung bases. Right central line tip overlies the inferior SVC level. No acute bony abnormality. Telemetry leads overlie the chest. IMPRESSION: 1. No acute cardiopulmonary findings. 2. Right central line tip overlies the inferior SVC level. Electronically Signed   By: Kennith Center M.D.   On: 05/27/2023 07:48   ECHOCARDIOGRAM COMPLETE  Result Date: 05/26/2023    ECHOCARDIOGRAM REPORT   Patient Name:   James Clarke Date of Exam: 05/26/2023 Medical Rec #:  [id number]       Height:       70.0 in Accession #:    [id number]      Weight:       307.8 lb Date of Birth:  12/21/1940      BSA:          2.507 m Patient Age:    81 years        BP:           170/93 mmHg Patient Gender: M               HR:           71 bpm. Exam Location:  [facility] Procedure: 2D Echo, Cardiac Doppler, Color Doppler and Intracardiac            Opacification Agent Indications:     CHF  History:         Patient has prior history of Echocardiogram examinations, most                  recent 12/17/2022. CHF, CAD, Signs/Symptoms:Edema; Risk                  Factors:Hypertension, Diabetes, Dyslipidemia and Sleep Apnea.                  ESRD on Dialysis.  Sonographer:     Mikki Harbor Referring Phys:  [id number] Emeline General Diagnosing Phys: Lorine Bears MD  Sonographer Comments: Technically difficult study due to poor echo windows and patient is obese. IMPRESSIONS  1. Left ventricular ejection fraction, by estimation, is 30 to 35%. The left ventricle has moderately decreased function. Left ventricular endocardial border not optimally defined to evaluate regional wall motion. The left ventricular internal cavity size was mildly dilated.  There is moderate left ventricular hypertrophy. Left ventricular diastolic parameters are indeterminate.  2. Right ventricular systolic function is normal. The right ventricular size is normal. Tricuspid regurgitation signal is inadequate for assessing PA pressure.  3. Left atrial size was moderately dilated.  4. Right atrial size was mildly dilated.  5. The mitral valve is normal in structure. Mild mitral valve regurgitation. No evidence of mitral stenosis.  6. The aortic valve is normal in structure.  Aortic valve regurgitation is not visualized. Aortic valve sclerosis is present, with no evidence of aortic valve stenosis. Comparison(s): A prior study was performed on 11/17/22. Changes from prior study are noted. The left ventricular function is significantly worse. FINDINGS  Left Ventricle: Left ventricular ejection fraction, by estimation, is 30 to 35%. The left ventricle has moderately decreased function. Left ventricular endocardial border not optimally defined to evaluate regional wall motion. Definity contrast agent was given IV to delineate the left ventricular endocardial borders. The left ventricular internal cavity size was mildly dilated. There is moderate left ventricular hypertrophy. Left ventricular diastolic parameters are indeterminate. Right Ventricle: The right ventricular size is normal. No increase in right ventricular wall thickness. Right ventricular systolic function is normal. Tricuspid regurgitation signal is inadequate for assessing PA pressure. The tricuspid regurgitant velocity is 2.18 m/s, and with an assumed right atrial pressure of 3 mmHg, the estimated right ventricular systolic pressure is 22.0 mmHg. Left Atrium: Left atrial size was moderately dilated. Right Atrium: Right atrial size was mildly dilated. Pericardium: Trivial pericardial effusion is present. Mitral Valve: The mitral valve is normal in structure. Mild mitral valve regurgitation. No evidence of mitral valve stenosis. MV  peak gradient, 3.2 mmHg. The mean mitral valve gradient is 1.0 mmHg. Tricuspid Valve: The tricuspid valve is normal in structure. Tricuspid valve regurgitation is trivial. No evidence of tricuspid stenosis. Aortic Valve: The aortic valve is normal in structure. Aortic valve regurgitation is not visualized. Aortic valve sclerosis is present, with no evidence of aortic valve stenosis. Aortic valve mean gradient measures 3.0 mmHg. Aortic valve peak gradient measures 8.6 mmHg. Aortic valve area, by VTI measures 1.95 cm. Pulmonic Valve: The pulmonic valve was normal in structure. Pulmonic valve regurgitation is not visualized. No evidence of pulmonic stenosis. Aorta: The aortic root is normal in size and structure. IAS/Shunts: No atrial level shunt detected by color flow Doppler.  LEFT VENTRICLE PLAX 2D LVIDd:         5.70 cm   Diastology LVIDs:         5.20 cm   LV e' medial:    5.22 cm/s LV PW:         1.20 cm   LV E/e' medial:  12.2 LV IVS:        1.40 cm   LV e' lateral:   5.00 cm/s LVOT diam:     2.10 cm   LV E/e' lateral: 12.7 LV SV:         67 LV SV Index:   27 LVOT Area:     3.46 cm  RIGHT VENTRICLE RV Basal diam:  4.50 cm RV Mid diam:    4.60 cm RV S prime:     16.80 cm/s LEFT ATRIUM              Index        RIGHT ATRIUM           Index LA diam:        4.10 cm  1.64 cm/m   RA Area:     23.70 cm LA Vol (A2C):   117.0 ml 46.67 ml/m  RA Volume:   74.20 ml  29.60 ml/m LA Vol (A4C):   86.7 ml  34.58 ml/m LA Biplane Vol: 110.0 ml 43.88 ml/m  AORTIC VALVE                    PULMONIC VALVE AV Area (Vmax):    2.40 cm  PV Vmax:       1.06 m/s AV Area (Vmean):   2.78 cm     PV Peak grad:  4.5 mmHg AV Area (VTI):     1.95 cm AV Vmax:           147.00 cm/s AV Vmean:          80.600 cm/s AV VTI:            0.342 m AV Peak Grad:      8.6 mmHg AV Mean Grad:      3.0 mmHg LVOT Vmax:         102.00 cm/s LVOT Vmean:        64.800 cm/s LVOT VTI:          0.193 m LVOT/AV VTI ratio: 0.56  AORTA Ao Root diam: 3.80 cm  MITRAL VALVE               TRICUSPID VALVE MV Area (PHT): 2.91 cm    TR Peak grad:   19.0 mmHg MV Area VTI:   2.65 cm    TR Vmax:        218.00 cm/s MV Peak grad:  3.2 mmHg MV Mean grad:  1.0 mmHg    SHUNTS MV Vmax:       0.89 m/s    Systemic VTI:  0.19 m MV Vmean:      49.9 cm/s   Systemic Diam: 2.10 cm MV Decel Time: 261 msec MV E velocity: 63.70 cm/s MV A velocity: 44.00 cm/s MV E/A ratio:  1.45 Lorine Bears MD Electronically signed by Lorine Bears MD Signature Date/Time: 05/26/2023/5:43:57 PM    Final    DG Chest Port 1 View  Result Date: 05/26/2023 CLINICAL DATA:  Chest pain.  Shortness of breath. EXAM: PORTABLE CHEST 1 VIEW COMPARISON:  12/13/2022. FINDINGS: Low lung volume. There is stable mild increased bronchovascular markings without frank pulmonary edema. There are probable atelectatic changes at the lung bases. Bilateral lung fields are otherwise clear. There is blunting of bilateral costophrenic angles, which may represent small bilateral pleural effusions. Stable moderately enlarged cardio-mediastinal silhouette. No acute osseous abnormalities. The soft tissues are within normal limits. There is right IJ hemodialysis catheter with its tip overlying the cavoatrial junction region. IMPRESSION: *Low lung volume. No frank pulmonary edema. Probable bilateral pleural effusions and atelectatic changes at the lung bases. Right IJ hemodialysis catheter noted. Electronically Signed   By: Jules Schick M.D.   On: 05/26/2023 08:54     Medications:      aspirin EC  81 mg Oral Daily   Chlorhexidine Gluconate Cloth  6 each Topical Q0600   fluticasone  1 spray Each Nare Daily   furosemide  80 mg Oral BID   insulin aspart  0-9 Units Subcutaneous TID WC   insulin aspart  14 Units Subcutaneous TID WC   insulin detemir  42 Units Subcutaneous QHS   isosorbide mononitrate  30 mg Oral Daily   linagliptin  5 mg Oral Daily   loratadine  10 mg Oral Daily   metoprolol succinate  50 mg Oral Daily    potassium citrate  20 mEq Oral BID AC   pravastatin  40 mg Oral q1800   sertraline  50 mg Oral Daily   sodium chloride flush  3 mL Intravenous Q12H   tamsulosin  0.4 mg Oral QPC supper   acetaminophen, albuterol, alteplase, diazepam, heparin, hydrALAZINE, nitroGLYCERIN, ondansetron (ZOFRAN) IV, sodium chloride flush  Assessment/ Plan:  Mr.  James Clarke is a 82 y.o.  male well-known to our practice.  Medical history consistent with hypertension, CHF, nonobstructive CAD, IDDM with diabetic nephropathy, obesity, BPH, and end-stage renal disease on hemodialysis.  Patient presents to the emergency room from home complaining of chest pain and shortness of breath and has been admitted for CHF (congestive heart failure) (HCC) [I50.9] NSTEMI (non-ST elevated myocardial infarction) (HCC) [I21.4] Acute on chronic congestive heart failure, unspecified heart failure type (HCC) [I50.9]  [facility]/MWF/right chest PermCath  End-stage renal disease on hemodialysis.  Allowed patient to complete cardiac workup yesterday. Dialysis scheduled for this afternoon. Will perform next dialysis treatment on Wednesday to maintain outpatient schedule.   2.  Chest pain with elevated troponin.  Cardiology consulted, feels this is likely demand ischemia.  Echo shows EF 30-35%.  Furosemide transitioned to oral and heparin drip stopped.   3. Anemia of chronic kidney disease Lab Results  Component Value Date   HGB 10.4 (L) 05/27/2023    Patient receives Mircera at outpatient clinic.  Hemoglobin  within desired goal.  Will continue to monitor  4. Secondary Hyperparathyroidism: with outpatient labs: PTH 310, phosphorus 4.6, calcium 8.7 on 04/11/23.   Lab Results  Component Value Date   CALCIUM 8.3 (L) 05/27/2023   CAION 1.08 (L) 09/19/2022   PHOS 4.4 12/13/2022    Will continue to monitor during this admission.  5.  Hypertension with chronic kidney disease Home regimen includes isosorbide, amlodipine,  metoprolol, spironolactone, and torsemide.  Spironolactone and torsemide held.  Remains on isosorbide and metoprolol.    LOS: 1 Armetta Henri 11/5/20243:22 PM

## 2023-05-27 NOTE — Evaluation (Signed)
Physical Therapy Evaluation Patient Details Name: James Clarke MRN: [id number] DOB: 01-19-1941 Today's Date: 05/27/2023  History of Present Illness  Patient is a 82 year old male with worsening shortness of breath, chest tightness, CHF. History of ESRD on hemodialysis  Clinical Impression  Patient agreeable to PT evaluation. Patient was modified independent prior to this hospital stay, using a cane for ambulation outside the home and requiring some assistance from spouse with ADLs.  Today, patient was able to complete bed mobility, transfers, and ambulation with CGA. Hallway ambulation performed using rolling walker which was recommended for improved gait pattern and balance. No dizziness reported with activity and no pain is reported. Mild fatigue with mild shortness of breath noted at end of ambulation bout. The family is hoping for home with no PT needs. PT will continue to follow while in the hospital to maximize independence and decrease caregiver burden.       If plan is discharge home, recommend the following: Assistance with cooking/housework;Help with stairs or ramp for entrance;A little help with bathing/dressing/bathroom   Can travel by private vehicle        Equipment Recommendations None recommended by PT  Recommendations for Other Services       Functional Status Assessment Patient has had a recent decline in their functional status and demonstrates the ability to make significant improvements in function in a reasonable and predictable amount of time.     Precautions / Restrictions Precautions Precautions: Fall Restrictions Weight Bearing Restrictions: No      Mobility  Bed Mobility Overal bed mobility: Needs Assistance Bed Mobility: Supine to Sit, Sit to Supine     Supine to sit: Contact guard, HOB elevated     General bed mobility comments: increased time, no physical lifting assistance required    Transfers Overall transfer level: Needs  assistance Equipment used: Rolling walker (2 wheels) Transfers: Sit to/from Stand Sit to Stand: Contact guard assist                Ambulation/Gait Ambulation/Gait assistance: Contact guard assist Gait Distance (Feet): 175 Feet Assistive device: Rolling walker (2 wheels) Gait Pattern/deviations: Decreased stride length, Trunk flexed, Narrow base of support Gait velocity: decreased     General Gait Details: patient walked a short distance without device with very narrow base of support  and reaching out for furniture for support. recommend to use rolling walker at this time for improved balance and gait pattern  Stairs            Wheelchair Mobility     Tilt Bed    Modified Rankin (Stroke Patients Only)       Balance Overall balance assessment: Needs assistance Sitting-balance support: Feet supported, No upper extremity supported Sitting balance-Leahy Scale: Good     Standing balance support: No upper extremity supported Standing balance-Leahy Scale: Fair Standing balance comment: static standing is fair without UE support                             Pertinent Vitals/Pain Pain Assessment Pain Assessment: No/denies pain    Home Living Family/patient expects to be discharged to:: Private residence Living Arrangements: Spouse/significant other Available Help at Discharge: Family;Available 24 hours/day Type of Home: House Home Access: Stairs to enter Entrance Stairs-Rails: Left Entrance Stairs-Number of Steps: 3-4   Home Layout: One level Home Equipment: Cane - single point;Grab bars - tub/shower;Shower seat - built in;Other (comment);Hand held shower head;Rolling Walker (2  wheels)      Prior Function Prior Level of Function : Independent/Modified Independent             Mobility Comments: Uses SPC in community and outside of home ADLs Comments: Wife min assist for LB dressing and bathing to avoiding getting port wet, no driving      Extremity/Trunk Assessment   Upper Extremity Assessment Upper Extremity Assessment: Generalized weakness    Lower Extremity Assessment Lower Extremity Assessment: Generalized weakness       Communication   Communication Communication: No apparent difficulties Cueing Techniques: Verbal cues  Cognition Arousal: Alert Behavior During Therapy: WFL for tasks assessed/performed Overall Cognitive Status: Within Functional Limits for tasks assessed                                          General Comments General comments (skin integrity, edema, etc.): mild shortness of breath with walking towards the end of the walking bout. heart rate 100bpm . spouse reports blood sugar was 84 with walking.    Exercises     Assessment/Plan    PT Assessment Patient needs continued PT services  PT Problem List Decreased mobility;Decreased balance;Decreased strength;Cardiopulmonary status limiting activity       PT Treatment Interventions DME instruction;Gait training;Stair training;Functional mobility training;Therapeutic activities;Therapeutic exercise;Balance training;Patient/family education    PT Goals (Current goals can be found in the Care Plan section)  Acute Rehab PT Goals Patient Stated Goal: to go home PT Goal Formulation: With patient/family Time For Goal Achievement: 06/10/23 Potential to Achieve Goals: Good    Frequency Min 1X/week     Co-evaluation PT/OT/SLP Co-Evaluation/Treatment: Yes Reason for Co-Treatment: Complexity of the patient's impairments (multi-system involvement);For patient/therapist safety PT goals addressed during session: Mobility/safety with mobility OT goals addressed during session: ADL's and self-care;Proper use of Adaptive equipment and DME       AM-PAC PT "6 Clicks" Mobility  Outcome Measure Help needed turning from your back to your side while in a flat bed without using bedrails?: None Help needed moving from lying on your  back to sitting on the side of a flat bed without using bedrails?: A Little Help needed moving to and from a bed to a chair (including a wheelchair)?: A Little Help needed standing up from a chair using your arms (e.g., wheelchair or bedside chair)?: A Little Help needed to walk in hospital room?: A Little Help needed climbing 3-5 steps with a railing? : A Little 6 Click Score: 19    End of Session   Activity Tolerance: Patient tolerated treatment well Patient left:  (standing at sink with  OT) Nurse Communication: Mobility status PT Visit Diagnosis: Muscle weakness (generalized) (M62.81);Unsteadiness on feet (R26.81)    Time: 7829-5621 PT Time Calculation (min) (ACUTE ONLY): 22 min   Charges:   PT Evaluation $PT Eval Low Complexity: 1 Low   PT General Charges $$ ACUTE PT VISIT: 1 Visit         Donna Bernard, PT, MPT   Ina Homes 05/27/2023, 10:53 AM

## 2023-05-27 NOTE — TOC CM/SW Note (Signed)
CSW has gone by the room several times today to complete readmission prevention screen but he has been working with staff members. Patient is now off the unit for HD. CSW will follow up again later.  Charlynn Court, CSW [phone]

## 2023-05-27 NOTE — Progress Notes (Signed)
Progress Note   Patient: James Clarke ZOX:[id number] DOB: [birth year] DOA: 05/26/2023     1 DOS: the patient was seen and examined on 05/27/2023   Brief hospital course: HPI on admission 05/26/23:  " James Clarke is a 82 y.o. male with medical history significant of CKD stage V with recently started HD, chronic HFrEF with LVEF 45% 2023, refractory HTN, nonobstructive CAD, IDDM with diabetic neuropathy, morbid obesity, BPH, presented worsening of shortness of breath and new onset of chest pain.   Patient recently started hemodialysis MWF about 4 weeks ago.  At baseline patient used to have history of poorly controlled hypertension, the initial 2 weeks of dialysis patient experienced much improved blood pressure control.  However started about 2 weeks ago, patient started to have hypotension associated with dialysis and he was symptomatic with lightheadedness at the end of dialysis.  Last week his BP medication were adjusted to address the hypotension associated with HD, metoprolol dosage was cut down to half and amlodipine discontinued but only for dialysis days.  Despite the above-mentioned changes patient started have shortness of breath during the latter half of dialysis and has been recurrent persistently during dialysis for last 3 times.  Last night, patient started to have chest pains pressure-like 6-7/10, centrally located associated with increasing shortness of breath which woke him up around 3 AM.  Took 1 nitroglycerin and aspirin with no significant improvement.  Denied any cough no peripheral swelling.  He weighed himself every day and found his weight has been Invirase within 1 pound day today.   ED Course: Afebrile, blood pressure 160/80, O2 saturation 97% on room air.  Blood work showed creatinine 3.3 above his baseline glucose 220, WBC 13, hemoglobin 10.9, VBG 7.3 9/49/31.  Chest x-ray showing pulmonary congestion.  Troponin first at 300   EKG showed sinus rhythm, chronic ST changes on  multiple leads.  Patient was started on heparin drip and Lasix 80 mg x 1 in the ED."    Pt was admitted to hospital with Cardiology and Nephrology consulted. Pt underwent emergent dialysis on admission and was continued on further IV Lasix for diuresis. Due to hs-troponin elevation, started on heparin drip empirically.  Further hospital course and management as outlined below.    Assessment and Plan:  Anginal-like chest pain - suspect volume overload and HTN emergency Elevation of troponin due to demand ischemia  --Cardiology following --Heparin drip stopped --Echo pending  Acute on chronic HFrEF --Volume mgmt by Diuresis and HD  --Continue Imdur and spironolactone --Strict I/O's & daily weights --Monitor renal parameters, electrolytes --Cardiology following --Further GDMT per Cardiology based on BP's post-dialysis    HTN emergency --Pt underwent emergent dialysis on admission --On metoprolol, Imdur --Ov IV Lasix >>  PO today per Nephrology --PRN hydralazine   CKD stage V --Fluid overload, HD on admission emergently --HD today --Nephrology following   Leukocytosis No symptoms signs of acute infection, suspect this is reactive  --Monitor CBC --Defer antibiotics unless clear indication   Nonobstructive CAD --Continue aspirin and statin   IDDM with hyperglycemia --Significant insulin resistance, continue Lantus 42 units daily and NovoLog 14 units 3 times daily AC --Sliding scale novolog --Tradjenta started on admission  Deconditioning --PT evaluation --Fall precautions   Morbid obesity Body mass index is 43.15 kg/m. Complicates overall care and prognosis.  Recommend lifestyle modifications including physical activity and diet for weight loss and overall long-term health.      Subjective: Pt seen with wife at bedside today.  He reports feeling better, no chest pressure today. No SOB or other complaints.  Eating breakfast sitting up in bed. States dialysis  has been life changing.   Physical Exam: Vitals:   05/27/23 1158 05/27/23 1212 05/27/23 1245 05/27/23 1315  BP: (!) 153/71 (!) 172/84 (!) 174/81 (!) 156/88  Pulse: 90 77 80 70  Resp: 20 14 19 15   Temp: 98.1 F (36.7 C)     TempSrc:      SpO2: 100%  100% 100%  Weight: (!) 136.4 kg     Height:       General exam: awake, alert, no acute distress, obese HEENT: moist mucus membranes, hearing grossly normal  Respiratory system: CTAB diminished bases, no wheezes, rales or rhonchi, normal respiratory effort. Cardiovascular system: normal S1/S2, RRR, unable to visualize JVD, no pedal edema.  Right upper chest HD cath in place Gastrointestinal system: soft, NT, ND Central nervous system: A&O x3. no gross focal neurologic deficits, normal speech Extremities: moves all, no edema, normal tone Skin: dry, intact, normal temperature Psychiatry: normal mood, congruent affect, judgement and insight appear normal   Data Reviewed:  Notable labs ---  Glucose 154 BUN 53 Cr 3.66, Ca 8.3 WBCX 10.9 Hbg 10.4  ECHO -- EF 30-35%  Family Communication: wife at bedside  Disposition: Status is: Inpatient Remains inpatient appropriate because: ongoing evaluation, needs further clincial improvement.  Discharge pending clearance by consultants    Planned Discharge Destination: Home    Time spent: 45 minutes  Author: Pennie Banter, DO 05/27/2023 1:36 PM  For on call review [email or web address].

## 2023-05-27 NOTE — Progress Notes (Signed)
PHARMACY - ANTICOAGULATION CONSULT NOTE  Pharmacy Consult for Heparin Drip Indication: chest pain/ACS  Patient Measurements: Height: 5\' 10"  (177.8 cm) Weight: (!) 139.6 kg (307 lb 12.8 oz) IBW/kg (Calculated) : 73 Heparin Dosing Weight: 105.8 kg  Labs: Recent Labs    05/26/23 0749 05/26/23 0807 05/26/23 0950 05/26/23 1525 05/26/23 1826 05/27/23 0416  HGB 10.9*  --   --   --   --  10.4*  HCT 33.4*  --   --   --   --  32.2*  PLT 294  --   --   --   --  288  APTT  --  32  --   --   --   --   LABPROT  --  13.7  --   --   --   --   INR  --  1.0  --   --   --   --   HEPARINUNFRC  --   --   --   --  0.19* 0.45  CREATININE 3.35*  --   --   --   --   --   TROPONINIHS 316*  --  353* 462*  --   --     Estimated Creatinine Clearance: 24 mL/min (A) (by C-G formula based on SCr of 3.35 mg/dL (H)).  Medical History: Past Medical History:  Diagnosis Date   (HFpEF) heart failure with preserved ejection fraction (HCC)    a.) TTE 11/26/2021: EF 45%, mod LVH, post HK, mod LAE, mild MR/TR, G2DD; b.) TTE 02/26/2022: EF 55-60%, BAE, triv MR, G2DD; c.) TTE 07/09/2022: EF 45%, post HK, LVH, mod LAE, triv TR/PR, mild MR, G1DD   Anemia of chronic renal failure    Aortic atherosclerosis (HCC)    Asthma    Bell's palsy    BPH (benign prostatic hyperplasia)    CAD (coronary artery disease)    a.) LHC 12/31/2021: 60% mLAD, 20% pRCA, 20% dRCA, 20% o-pLCx --> med mgmt.   CKD (chronic kidney disease), stage IV (HCC)    Diabetic neuropathy (HCC)    Dyspnea on exertion    Ganglion cyst of wrist, right    a.) s/p excision 04/2011   Hepatic steatosis    History of 2019 novel coronavirus disease (COVID-19)    History of bilateral cataract extraction 2018   Hyperlipidemia    Hyperparathyroidism due to renal insufficiency (HCC)    Hypertension    Low testosterone in male    Nephrolithiasis    Obesity    OSA treated with BiPAP    Osteoarthritis    Peripheral edema    Right inguinal hernia    a.)  s/p repair   Skin cancer    Type 2 diabetes mellitus with renal manifestations [facility])     Assessment: 82 yo M to start Heparin drip for chest pain, ACS/STEMI. Patient on ASA PTA per Med Rec Hgb 10.9  Plt 294  aPTT 32   INR 1.0 Hemodialysis patient  1104 1826 HL 0.19, subthera; 1400 un/hr 1105 0416 HL 0.45, therapeutic X 1, 1750 un/hr  Goal of Therapy:  Heparin level 0.3-0.7 units/ml Monitor platelets by anticoagulation protocol: Yes   Plan:  11/05:  HL @ 0416 = 0.45, therapeutic X 1 - Will continue pt on current rate and recheck HL in 8 hrs @ 1200.  --Daily CBC per protocol  James Clarke 05/27/2023

## 2023-05-27 NOTE — Progress Notes (Signed)
Received patient in bed to unit.    Informed consent signed and in chart.    TX duration: 3 hrs     Transported back to floor  Hand-off given to patient's nurse.   Access used:  rt cvc chest  Access issues: n/a Dressing changed  Total UF removed: 1900 mls Post HD weight: 135kg      Maple Hudson, RN Dialysis Unit

## 2023-05-28 DIAGNOSIS — I509 Heart failure, unspecified: Secondary | ICD-10-CM | POA: Diagnosis not present

## 2023-05-28 DIAGNOSIS — I214 Non-ST elevation (NSTEMI) myocardial infarction: Secondary | ICD-10-CM

## 2023-05-28 LAB — BASIC METABOLIC PANEL
Anion gap: 9 (ref 5–15)
BUN: 41 mg/dL — ABNORMAL HIGH (ref 8–23)
CO2: 28 mmol/L (ref 22–32)
Calcium: 7.9 mg/dL — ABNORMAL LOW (ref 8.9–10.3)
Chloride: 99 mmol/L (ref 98–111)
Creatinine, Ser: 2.74 mg/dL — ABNORMAL HIGH (ref 0.61–1.24)
GFR, Estimated: 22 mL/min — ABNORMAL LOW (ref >60)
Glucose, Bld: 219 mg/dL — ABNORMAL HIGH (ref 70–99)
Potassium: 3.5 mmol/L (ref 3.5–5.1)
Sodium: 136 mmol/L (ref 135–145)

## 2023-05-28 LAB — GLUCOSE, CAPILLARY
Glucose-Capillary: 189 mg/dL — ABNORMAL HIGH (ref 70–99)
Glucose-Capillary: 268 mg/dL — ABNORMAL HIGH (ref 70–99)
Glucose-Capillary: 135 mg/dL — ABNORMAL HIGH (ref 70–99)
Glucose-Capillary: 169 mg/dL — ABNORMAL HIGH (ref 70–99)

## 2023-05-28 LAB — HEPATITIS B SURFACE ANTIBODY, QUANTITATIVE: Hep B S AB Quant (Post): <3.5 m[IU]/mL — ABNORMAL LOW

## 2023-05-28 MED ORDER — SACUBITRIL-VALSARTAN 24-26 MG PO TABS
1.0000 | ORAL_TABLET | Freq: Two times a day (BID) | ORAL | Status: DC
  Administered 2023-05-28 – 2023-05-29 (×3): 1 via ORAL
  Filled 2023-05-28 (×3): qty 1

## 2023-05-28 MED ORDER — METOPROLOL SUCCINATE ER 100 MG PO TB24
100.0000 mg | ORAL_TABLET | Freq: Every day | ORAL | Status: DC
  Administered 2023-05-28 – 2023-05-29 (×2): 100 mg via ORAL
  Filled 2023-05-28 (×2): qty 1

## 2023-05-28 NOTE — Discharge Planning (Signed)
ESTABLISHED HEMODIALYSIS Outpatient Facility  [facility] [address] [place], [place] [address] [phone]  Schedule: MWF 11:30am   Patient is active and can resume schedule upon discharge.  Dimas Chyle Dialysis Coordinator II  [facility] Cell: [phone] eFax: [phone] [email or web address]

## 2023-05-28 NOTE — Progress Notes (Signed)
[facility]  ROUNDING NOTE   Subjective:   James Clarke is a 82 year old male well-known to our practice.  Medical history consistent with hypertension, CHF, nonobstructive CAD, IDDM with diabetic nephropathy, obesity, BPH, and end-stage renal disease on hemodialysis.  Patient presents to the emergency room from home complaining of chest pain and shortness of breath and has been admitted for CHF (congestive heart failure) (HCC) [I50.9] NSTEMI (non-ST elevated myocardial infarction) (HCC) [I21.4] Acute on chronic congestive heart failure, unspecified heart failure type [facility]) [I50.9]  Patient is known to our practice and receives outpatient dialysis treatments at [facility] on a MWF schedule, supervised by Dr. Cherylann Ratel.    Patient sitting up in bed, wife at bedside Appears out of breath however wife states they just spoke with cardiology and were overwhelmed with the information States they are suggesting multiple medication changes and seeing a different doctor.   Objective:  Vital signs in last 24 hours:  Temp:  [97.6 F (36.4 C)-98.6 F (37 C)] 97.6 F (36.4 C) (11/06 0447) Pulse Rate:  [58-80] 58 (11/06 0447) Resp:  [14-26] 20 (11/06 0447) BP: (137-174)/(64-88) 141/71 (11/06 0447) SpO2:  [91 %-100 %] 95 % (11/06 0447) Weight:  [134 kg-135 kg] 134 kg (11/06 0500)  Weight change: -3.217 kg Filed Weights   05/27/23 1158 05/27/23 1533 05/28/23 0500  Weight: (!) 136.4 kg 135 kg 134 kg    Intake/Output: I/O last 3 completed shifts: In: 684 [P.O.:590; I.V.:94] Out: 3450 [Urine:1550; Other:1900]   Intake/Output this shift:  Total I/O In: -  Out: 500 [Urine:500]  Physical Exam: Clarke: NAD  Head: Normocephalic, atraumatic. Moist oral mucosal membranes  Eyes: Anicteric  Lungs:  Clear to auscultation, normal effort, room air  Heart: Regular rate and rhythm  Abdomen:  Soft, nontender, obese  Extremities: No peripheral edema.  Neurologic: Alert and oriented,  moving all four extremities  Skin: No lesions  Access: Right chest PermCath    Basic Metabolic Panel: Recent Labs  Lab 05/26/23 0749 05/27/23 0416 05/28/23 0407  NA 137 139 136  K 3.8 3.6 3.5  CL 100 101 99  CO2 25 27 28   GLUCOSE 220* 154* 219*  BUN 47* 53* 41*  CREATININE 3.35* 3.66* 2.74*  CALCIUM 8.3* 8.3* 7.9*    Liver Function Tests: Recent Labs  Lab 05/26/23 0749  AST 20  ALT 18  ALKPHOS 74  BILITOT 0.5  PROT 6.7  ALBUMIN 2.9*   Recent Labs  Lab 05/26/23 0749  LIPASE 41   No results for input(s): "AMMONIA" in the last 168 hours.  CBC: Recent Labs  Lab 05/26/23 0749 05/27/23 0416  WBC 13.1* 10.9*  NEUTROABS 10.3*  --   HGB 10.9* 10.4*  HCT 33.4* 32.2*  MCV 91.8 93.1  PLT 294 288    Cardiac Enzymes: No results for input(s): "CKTOTAL", "CKMB", "CKMBINDEX", "TROPONINI" in the last 168 hours.  BNP: Invalid input(s): "POCBNP"  CBG: Recent Labs  Lab 05/27/23 1127 05/27/23 1651 05/27/23 2036 05/28/23 0808 05/28/23 1159  GLUCAP 145* 116* 269* 189* 268*    Microbiology: Results for orders placed or performed during the hospital encounter of 05/26/23  MRSA Next Gen by PCR, Nasal     Status: Abnormal   Collection Time: 05/26/23  1:35 PM   Specimen: Nasal Mucosa; Nasal Swab  Result Value Ref Range Status   MRSA by PCR Next Gen DETECTED (A) NOT DETECTED Final    Comment: RESULT CALLED TO, READ BACK BY AND VERIFIED WITH:  PRAWEENA BHUSAL 05/26/2023 1748 CP (NOTE) The GeneXpert MRSA Assay (FDA approved for NASAL specimens only), is one component of a comprehensive MRSA colonization surveillance program. It is not intended to diagnose MRSA infection nor to guide or monitor treatment for MRSA infections. Test performance is not FDA approved in patients less than 20 years old. Performed at [facility], [address] [place], [place] [address]     Coagulation Studies: Recent Labs    05/26/23 0807  LABPROT 13.7  INR 1.0     Urinalysis: No results for input(s): "COLORURINE", "LABSPEC", "PHURINE", "GLUCOSEU", "HGBUR", "BILIRUBINUR", "KETONESUR", "PROTEINUR", "UROBILINOGEN", "NITRITE", "LEUKOCYTESUR" in the last 72 hours.  Invalid input(s): "APPERANCEUR"    Imaging: DG Chest 1 View  Result Date: 05/27/2023 CLINICAL DATA:  CHF. EXAM: CHEST  1 VIEW COMPARISON:  05/26/2023 FINDINGS: The cardio pericardial silhouette is enlarged. Stable chronic interstitial changes at the lung bases. Right central line tip overlies the inferior SVC level. No acute bony abnormality. Telemetry leads overlie the chest. IMPRESSION: 1. No acute cardiopulmonary findings. 2. Right central line tip overlies the inferior SVC level. Electronically Signed   By: Kennith Center M.D.   On: 05/27/2023 07:48   ECHOCARDIOGRAM COMPLETE  Result Date: 05/26/2023    ECHOCARDIOGRAM REPORT   Patient Name:   James Clarke Date of Exam: 05/26/2023 Medical Rec #:  [id number]       Height:       70.0 in Accession #:    [id number]      Weight:       307.8 lb Date of Birth:  February 01, 1941      BSA:          2.507 m Patient Age:    81 years        BP:           170/93 mmHg Patient Gender: M               HR:           71 bpm. Exam Location:  [facility] Procedure: 2D Echo, Cardiac Doppler, Color Doppler and Intracardiac            Opacification Agent Indications:     CHF  History:         Patient has prior history of Echocardiogram examinations, most                  recent 12/17/2022. CHF, CAD, Signs/Symptoms:Edema; Risk                  Factors:Hypertension, Diabetes, Dyslipidemia and Sleep Apnea.                  ESRD on Dialysis.  Sonographer:     Mikki Harbor Referring Phys:  [id number] James Clarke Diagnosing Phys: James Bears MD  Sonographer Comments: Technically difficult study due to poor echo windows and patient is obese. IMPRESSIONS  1. Left ventricular ejection fraction, by estimation, is 30 to 35%. The left ventricle has moderately decreased function. Left  ventricular endocardial border not optimally defined to evaluate regional wall motion. The left ventricular internal cavity size was mildly dilated. There is moderate left ventricular hypertrophy. Left ventricular diastolic parameters are indeterminate.  2. Right ventricular systolic function is normal. The right ventricular size is normal. Tricuspid regurgitation signal is inadequate for assessing PA pressure.  3. Left atrial size was moderately dilated.  4. Right atrial size was mildly dilated.  5. The mitral valve is normal in  structure. Mild mitral valve regurgitation. No evidence of mitral stenosis.  6. The aortic valve is normal in structure. Aortic valve regurgitation is not visualized. Aortic valve sclerosis is present, with no evidence of aortic valve stenosis. Comparison(s): A prior study was performed on 11/17/22. Changes from prior study are noted. The left ventricular function is significantly worse. FINDINGS  Left Ventricle: Left ventricular ejection fraction, by estimation, is 30 to 35%. The left ventricle has moderately decreased function. Left ventricular endocardial border not optimally defined to evaluate regional wall motion. Definity contrast agent was given IV to delineate the left ventricular endocardial borders. The left ventricular internal cavity size was mildly dilated. There is moderate left ventricular hypertrophy. Left ventricular diastolic parameters are indeterminate. Right Ventricle: The right ventricular size is normal. No increase in right ventricular wall thickness. Right ventricular systolic function is normal. Tricuspid regurgitation signal is inadequate for assessing PA pressure. The tricuspid regurgitant velocity is 2.18 m/s, and with an assumed right atrial pressure of 3 mmHg, the estimated right ventricular systolic pressure is 22.0 mmHg. Left Atrium: Left atrial size was moderately dilated. Right Atrium: Right atrial size was mildly dilated. Pericardium: Trivial pericardial  effusion is present. Mitral Valve: The mitral valve is normal in structure. Mild mitral valve regurgitation. No evidence of mitral valve stenosis. MV peak gradient, 3.2 mmHg. The mean mitral valve gradient is 1.0 mmHg. Tricuspid Valve: The tricuspid valve is normal in structure. Tricuspid valve regurgitation is trivial. No evidence of tricuspid stenosis. Aortic Valve: The aortic valve is normal in structure. Aortic valve regurgitation is not visualized. Aortic valve sclerosis is present, with no evidence of aortic valve stenosis. Aortic valve mean gradient measures 3.0 mmHg. Aortic valve peak gradient measures 8.6 mmHg. Aortic valve area, by VTI measures 1.95 cm. Pulmonic Valve: The pulmonic valve was normal in structure. Pulmonic valve regurgitation is not visualized. No evidence of pulmonic stenosis. Aorta: The aortic root is normal in size and structure. IAS/Shunts: No atrial level shunt detected by color flow Doppler.  LEFT VENTRICLE PLAX 2D LVIDd:         5.70 cm   Diastology LVIDs:         5.20 cm   LV e' medial:    5.22 cm/s LV PW:         1.20 cm   LV E/e' medial:  12.2 LV IVS:        1.40 cm   LV e' lateral:   5.00 cm/s LVOT diam:     2.10 cm   LV E/e' lateral: 12.7 LV SV:         67 LV SV Index:   27 LVOT Area:     3.46 cm  RIGHT VENTRICLE RV Basal diam:  4.50 cm RV Mid diam:    4.60 cm RV S prime:     16.80 cm/s LEFT ATRIUM              Index        RIGHT ATRIUM           Index LA diam:        4.10 cm  1.64 cm/m   RA Area:     23.70 cm LA Vol (A2C):   117.0 ml 46.67 ml/m  RA Volume:   74.20 ml  29.60 ml/m LA Vol (A4C):   86.7 ml  34.58 ml/m LA Biplane Vol: 110.0 ml 43.88 ml/m  AORTIC VALVE  PULMONIC VALVE AV Area (Vmax):    2.40 cm     PV Vmax:       1.06 m/s AV Area (Vmean):   2.78 cm     PV Peak grad:  4.5 mmHg AV Area (VTI):     1.95 cm AV Vmax:           147.00 cm/s AV Vmean:          80.600 cm/s AV VTI:            0.342 m AV Peak Grad:      8.6 mmHg AV Mean Grad:      3.0  mmHg LVOT Vmax:         102.00 cm/s LVOT Vmean:        64.800 cm/s LVOT VTI:          0.193 m LVOT/AV VTI ratio: 0.56  AORTA Ao Root diam: 3.80 cm MITRAL VALVE               TRICUSPID VALVE MV Area (PHT): 2.91 cm    TR Peak grad:   19.0 mmHg MV Area VTI:   2.65 cm    TR Vmax:        218.00 cm/s MV Peak grad:  3.2 mmHg MV Mean grad:  1.0 mmHg    SHUNTS MV Vmax:       0.89 m/s    Systemic VTI:  0.19 m MV Vmean:      49.9 cm/s   Systemic Diam: 2.10 cm MV Decel Time: 261 msec MV E velocity: 63.70 cm/s MV A velocity: 44.00 cm/s MV E/A ratio:  1.45 James Bears MD Electronically signed by James Bears MD Signature Date/Time: 05/26/2023/5:43:57 PM    Final      Medications:      aspirin EC  81 mg Oral Daily   Chlorhexidine Gluconate Cloth  6 each Topical Q0600   fluticasone  1 spray Each Nare Daily   furosemide  80 mg Oral BID   insulin aspart  0-9 Units Subcutaneous TID WC   insulin aspart  14 Units Subcutaneous TID WC   insulin detemir  42 Units Subcutaneous QHS   isosorbide mononitrate  30 mg Oral Daily   linagliptin  5 mg Oral Daily   loratadine  10 mg Oral Daily   metoprolol succinate  100 mg Oral Daily   potassium citrate  20 mEq Oral BID AC   pravastatin  40 mg Oral q1800   sacubitril-valsartan  1 tablet Oral BID   sertraline  50 mg Oral Daily   sodium chloride flush  3 mL Intravenous Q12H   tamsulosin  0.4 mg Oral QPC supper   acetaminophen, albuterol, diazepam, hydrALAZINE, nitroGLYCERIN, ondansetron (ZOFRAN) IV, sodium chloride flush  Assessment/ Plan:  Mr. Dellas Guard is a 82 y.o.  male well-known to our practice.  Medical history consistent with hypertension, CHF, nonobstructive CAD, IDDM with diabetic nephropathy, obesity, BPH, and end-stage renal disease on hemodialysis.  Patient presents to the emergency room from home complaining of chest pain and shortness of breath and has been admitted for CHF (congestive heart failure) (HCC) [I50.9] NSTEMI (non-ST elevated  myocardial infarction) (HCC) [I21.4] Acute on chronic congestive heart failure, unspecified heart failure type (HCC) [I50.9]  [facility]/MWF/right chest PermCath  End-stage renal disease on hemodialysis.  Patient received dialysis yesterday with 2 L fluid removal.  Patient will receive dialysis again today to maintain outpatient schedule.  2.  Chest pain with  elevated troponin.  Cardiology consulted, feels this is likely demand ischemia.  Echo shows EF 30-35%.  Remains on oral furosemide.  Cardiology performing medication review and considering low-dose Entresto.  3. Anemia of chronic kidney disease Lab Results  Component Value Date   HGB 10.4 (L) 05/27/2023    Patient receives Mircera at outpatient clinic.  Hemoglobin at goal.  Will continue to monitor  4. Secondary Hyperparathyroidism: with outpatient labs: PTH 310, phosphorus 4.6, calcium 8.7 on 04/11/23.   Lab Results  Component Value Date   CALCIUM 7.9 (L) 05/28/2023   CAION 1.08 (L) 09/19/2022   PHOS 4.4 12/13/2022    Calcium and phosphorus within optimal range.  5.  Hypertension with chronic kidney disease Home regimen includes isosorbide, amlodipine, metoprolol, spironolactone, and torsemide.  Spironolactone and torsemide held.  Remains on isosorbide and metoprolol.    LOS: 2 Wynema Garoutte 11/6/202412:19 PM

## 2023-05-28 NOTE — Progress Notes (Signed)
  Received patient in bed to unit.   Informed consent signed and in chart.    TX duration: 3.5hrs     Transported back to floor  Hand-off given to patient's nurse. No c/o and no distress noted    Access used: R hd catheter  Access issues: none   Total UF removed: 1.0L Medication(s) given: none Post HD VS: 130/85 Post HD weight: 134.2kg     Lynann Beaver  [facility]

## 2023-05-28 NOTE — Progress Notes (Signed)
[facility] CLINIC CARDIOLOGY PROGRESS NOTE       Patient ID: Acel Natzke MRN: [id number] DOB/AGE: July 04, 1941 82 y.o.  Admit date: 05/26/2023 Referring Physician Mikey College, MD Primary Physician Feldpausch, Madaline Guthrie, MD Primary Cardiologist [name], PA-C Reason for Consultation Heart Failure  HPI: Taeveon Keesling is a 82 y.o. male  with a past medical history of HFpEF (50-55%, 12/17/2022) aortic atherosclerosis, CAD with LHC 12/2021, hypertension, sleep apnea on BiPAP, type 2 diabetes, hyperlipidemia, ESRD on HD (M,W,F) who presented to the ED on 05/26/2023 for chest tightness and shortness of breath. Cardiology was consulted for further evaluation.   Interval history: -Patient feeling better overall this AM. Some SOB after walking to the bathroom and having a BM.  -1900 mL removed during dialysis yesterday, patient states he felt great after this.  -BP remains elevated. 13 beat NVST run yesterday evening noted on tele.  Review of systems complete and found to be negative unless listed above    Past Medical History:  Diagnosis Date   (HFpEF) heart failure with preserved ejection fraction (HCC)    a.) TTE 11/26/2021: EF 45%, mod LVH, post HK, mod LAE, mild MR/TR, G2DD; b.) TTE 02/26/2022: EF 55-60%, BAE, triv MR, G2DD; c.) TTE 07/09/2022: EF 45%, post HK, LVH, mod LAE, triv TR/PR, mild MR, G1DD   Anemia of chronic renal failure    Aortic atherosclerosis (HCC)    Asthma    Bell's palsy    BPH (benign prostatic hyperplasia)    CAD (coronary artery disease)    a.) LHC 12/31/2021: 60% mLAD, 20% pRCA, 20% dRCA, 20% o-pLCx --> med mgmt.   CKD (chronic kidney disease), stage IV (HCC)    Diabetic neuropathy (HCC)    Dyspnea on exertion    Ganglion cyst of wrist, right    a.) s/p excision 04/2011   Hepatic steatosis    History of 2019 novel coronavirus disease (COVID-19)    History of bilateral cataract extraction 2018   Hyperlipidemia    Hyperparathyroidism due to renal  insufficiency (HCC)    Hypertension    Low testosterone in male    Nephrolithiasis    Obesity    OSA treated with BiPAP    Osteoarthritis    Peripheral edema    Right inguinal hernia    a.) s/p repair   Skin cancer    Type 2 diabetes mellitus with renal manifestations [facility])     Past Surgical History:  Procedure Laterality Date   A/V FISTULAGRAM Left 01/13/2023   Procedure: A/V Fistulagram;  Surgeon: Annice Needy, MD;  Location: [facility] INVASIVE CV LAB;  Service: Cardiovascular;  Laterality: Left;   A/V SHUNT INTERVENTION Left 04/11/2023   Procedure: A/V SHUNT INTERVENTION;  Surgeon: Annice Needy, MD;  Location: [facility] INVASIVE CV LAB;  Service: Cardiovascular;  Laterality: Left;   APPENDECTOMY     AV FISTULA PLACEMENT Left 09/19/2022   Procedure: ARTERIOVENOUS (AV) FISTULA CREATION (RADIO CEPHALIC);  Surgeon: Annice Needy, MD;  Location: [facility] ORS;  Service: Vascular;  Laterality: Left;   CATARACT EXTRACTION W/PHACO Left 05/20/2017   Procedure: CATARACT EXTRACTION PHACO AND INTRAOCULAR LENS PLACEMENT (IOC);  Surgeon: Galen Manila, MD;  Location: [facility] ORS;  Service: Ophthalmology;  Laterality: Left;  [place] 00:32.0 AP% 15.5 CDE 4.97 Fluid Pack Lot # [id number]   CATARACT EXTRACTION W/PHACO Right 06/10/2017   Procedure: CATARACT EXTRACTION PHACO AND INTRAOCULAR LENS PLACEMENT (IOC);  Surgeon: Galen Manila, MD;  Location: [facility] ORS;  Service: Ophthalmology;  Laterality: Right;  [place]  00:51 AP% 15.4 CDE 7.95 Fluid pack lot # [id number]   CHOLECYSTECTOMY     COLONOSCOPY     x3   CYSTOSCOPY W/ RETROGRADES Right 08/13/2019   Procedure: CYSTOSCOPY WITH RETROGRADE PYELOGRAM;  Surgeon: Sondra Come, MD;  Location: [facility] ORS;  Service: Urology;  Laterality: Right;   CYSTOSCOPY WITH STENT PLACEMENT Right 07/26/2019   Procedure: CYSTOSCOPY WITH STENT PLACEMENT;  Surgeon: Crista Elliot, MD;  Location: [facility] ORS;  Service: Urology;  Laterality: Right;   CYSTOSCOPY/URETEROSCOPY/HOLMIUM  LASER/STENT PLACEMENT Right 08/13/2019   Procedure: CYSTOSCOPY/URETEROSCOPY/HOLMIUM LASER/STENT EXCHANGE;  Surgeon: Sondra Come, MD;  Location: [facility] ORS;  Service: Urology;  Laterality: Right;   CYSTOSCOPY/URETEROSCOPY/HOLMIUM LASER/STENT PLACEMENT Left 02/27/2022   Procedure: CYSTOSCOPY/URETEROSCOPY/HOLMIUM LASER/STENT PLACEMENT;  Surgeon: Riki Altes, MD;  Location: [facility] ORS;  Service: Urology;  Laterality: Left;   DIALYSIS/PERMA CATHETER INSERTION N/A 04/11/2023   Procedure: DIALYSIS/PERMA CATHETER INSERTION;  Surgeon: Annice Needy, MD;  Location: [facility] INVASIVE CV LAB;  Service: Cardiovascular;  Laterality: N/A;   GANGLION CYST EXCISION Right    INGUINAL HERNIA REPAIR Right    LEFT HEART CATH AND CORONARY ANGIOGRAPHY N/A 12/31/2021   Procedure: LEFT HEART CATH AND CORONARY ANGIOGRAPHY;  Surgeon: Lamar Blinks, MD;  Location: [facility] INVASIVE CV LAB;  Service: Cardiovascular;  Laterality: N/A;   SHOULDER ARTHROSCOPY WITH SUBACROMIAL DECOMPRESSION AND OPEN ROTATOR C Right 09/11/2020   Procedure: Right shoulder arthroscopic rotator cuff repair vs Regeneten patch application, subacromial decompression, and biceps tenodesis - Dedra Skeens to Assist;  Surgeon: Signa Kell, MD;  Location: [facility] ORS;  Service: Orthopedics;  Laterality: Right;   TEMPORARY DIALYSIS CATHETER N/A 03/05/2022   Procedure: TEMPORARY DIALYSIS CATHETER;  Surgeon: Renford Dills, MD;  Location: [facility] INVASIVE CV LAB;  Service: Cardiovascular;  Laterality: N/A;   TONSILLECTOMY     URETEROSCOPY WITH HOLMIUM LASER LITHOTRIPSY Left 02/27/2022   Procedure: URETEROSCOPY WITH HOLMIUM LASER LITHOTRIPSY;  Surgeon: Riki Altes, MD;  Location: [facility] ORS;  Service: Urology;  Laterality: Left;   WRIST FRACTURE SURGERY Right     Medications Prior to Admission  Medication Sig Dispense Refill Last Dose   acetaminophen (TYLENOL) 500 MG tablet Take 1,000 mg by mouth every 6 (six) hours as needed (shoulder pain).   prn at unk    albuterol (PROVENTIL HFA;VENTOLIN HFA) 108 (90 Base) MCG/ACT inhaler Inhale 2 puffs into the lungs every 6 (six) hours as needed for wheezing or shortness of breath.   prn at unk   aspirin EC 81 MG tablet Take by mouth.   05/25/2023   calcitRIOL (ROCALTROL) 0.25 MCG capsule Take 0.25 mcg by mouth daily.   05/25/2023   cetirizine (ZYRTEC) 10 MG chewable tablet Chew 10 mg by mouth daily as needed for allergies.   05/25/2023   diazepam (VALIUM) 2 MG tablet Take 2 mg by mouth every 12 (twelve) hours as needed.   prn at unk   fluticasone (FLONASE) 50 MCG/ACT nasal spray as needed.   prn at unk   furosemide (LASIX) 40 MG tablet Take 80 mg q am and 40 mg qpm (Patient taking differently: Take 80 mg by mouth 2 (two) times daily. On non dialysis days, Sunday, Tuesday, Thursday, Saturday and Sunday) 60 tablet 2 05/25/2023   insulin aspart (NOVOLOG) 100 UNIT/ML injection Inject 14 Units into the skin 3 (three) times daily with meals. (Patient taking differently: Inject 35 Units into the skin 3 (three) times daily with meals.) 10 mL 11 05/25/2023 at pm  insulin detemir (LEVEMIR) 100 UNIT/ML injection Inject 0.5 mLs (50 Units total) into the skin at bedtime. (Patient taking differently: Inject 35 Units into the skin at bedtime.) 10 mL 1 05/25/2023   isosorbide mononitrate (IMDUR) 30 MG 24 hr tablet Take 1 tablet (30 mg total) by mouth daily. 30 tablet 2 05/25/2023   lovastatin (MEVACOR) 40 MG tablet Take 40 mg by mouth daily with supper.   05/25/2023   metoprolol succinate (TOPROL-XL) 50 MG 24 hr tablet Take 50 mg by mouth daily. Do not take on Monday, Wednesday and Friday on dialysis days   05/25/2023 at am   midodrine (PROAMATINE) 10 MG tablet Take 10 mg by mouth daily. Take on dialysis days, Monday, Wednesday and Friday.   05/23/2023   nitroGLYCERIN (NITROSTAT) 0.4 MG SL tablet Place 1 tablet (0.4 mg total) under the tongue every 5 (five) minutes as needed for chest pain. 20 tablet 1 05/26/2023   potassium citrate  (UROCIT-K) 10 MEQ (1080 MG) SR tablet TAKE (1) TABLET BY MOUTH TWICE DAILY WITH A MEAL. 60 tablet 11 05/25/2023   sertraline (ZOLOFT) 50 MG tablet Take 50 mg by mouth at bedtime.   05/25/2023   tamsulosin (FLOMAX) 0.4 MG CAPS capsule TAKE (1) CAPSULE BY MOUTH EVERY DAY 30 capsule 1 05/25/2023   Continuous Blood Gluc Sensor (FREESTYLE LIBRE 14 DAY SENSOR) MISC by Does not apply route.      GLOBAL INJECT EASE INSULIN SYR 31G X 5/16" 0.5 ML MISC Inject into the skin.      glucose blood (ONETOUCH ULTRA) test strip TEST BLOOD SUGAR 4 TIMES DAILY      Lancets (ONETOUCH DELICA PLUS LANCET30G) MISC USE 1 LANCET 4 TIMES DAILY      spironolactone (ALDACTONE) 50 MG tablet Take 50 mg by mouth daily. (Patient not taking: Reported on 05/26/2023)   Not Taking   Social History   Socioeconomic History   Marital status: Married    Spouse name: Not on file   Number of children: Not on file   Years of education: Not on file   Highest education level: Not on file  Occupational History   Not on file  Tobacco Use   Smoking status: Never    Passive exposure: Never   Smokeless tobacco: Never  Vaping Use   Vaping status: Never Used  Substance and Sexual Activity   Alcohol use: No   Drug use: Not Currently   Sexual activity: Yes    Birth control/protection: None  Other Topics Concern   Not on file  Social History Narrative   Not on file   Social Determinants of Health   Financial Resource Strain: Low Risk  (12/20/2022)   Received from [facility], [facility]   Overall Financial Resource Strain (CARDIA)    Difficulty of Paying Living Expenses: Not hard at all  Food Insecurity: No Food Insecurity (05/26/2023)   Hunger Vital Sign    Worried About Running Out of Food in the Last Year: Never true    Ran Out of Food in the Last Year: Never true  Transportation Needs: No Transportation Needs (05/26/2023)   PRAPARE - Administrator, Civil Service (Medical): No     Lack of Transportation (Non-Medical): No  Physical Activity: Not on file  Stress: Not on file  Social Connections: Not on file  Intimate Partner Violence: Not At Risk (05/26/2023)   Humiliation, Afraid, Rape, and Kick questionnaire    Fear of Current or Ex-Partner:  No    Emotionally Abused: No    Physically Abused: No    Sexually Abused: No    Family History  Problem Relation Age of Onset   Emphysema Mother    COPD Mother    Heart disease Mother    Brain cancer Father      Vitals:   05/28/23 0031 05/28/23 0447 05/28/23 0500 05/28/23 1227  BP: (!) 146/68 (!) 141/71    Pulse: 62 (!) 58    Resp: 20 20  14   Temp: 98.6 F (37 C) 97.6 F (36.4 C)    TempSrc:      SpO2: 95% 95%    Weight:   134 kg   Height:        PHYSICAL EXAM General: chronically-ill appearing elderly male, well nourished, in no acute distress sitting upright on side of hospital bed.  HEENT: Normocephalic and atraumatic. Neck: No JVD.   Lungs: Normal respiratory effort on RA. Diminished breath sounds bilaterally and mild bibasilar crackles.  Heart: HRRR. Normal S1 and S2 without gallops or murmurs.  Abdomen: Non-distended appearing.  Msk: Normal strength and tone for age. Extremities: Warm and well perfused. No clubbing, cyanosis. Trace pitting edema.  Neuro: Alert and oriented X 3. Psych: Answers questions appropriately.   Labs: Basic Metabolic Panel: Recent Labs    05/27/23 0416 05/28/23 0407  NA 139 136  K 3.6 3.5  CL 101 99  CO2 27 28  GLUCOSE 154* 219*  BUN 53* 41*  CREATININE 3.66* 2.74*  CALCIUM 8.3* 7.9*   Liver Function Tests: Recent Labs    05/26/23 0749  AST 20  ALT 18  ALKPHOS 74  BILITOT 0.5  PROT 6.7  ALBUMIN 2.9*   Recent Labs    05/26/23 0749  LIPASE 41   CBC: Recent Labs    05/26/23 0749 05/27/23 0416  WBC 13.1* 10.9*  NEUTROABS 10.3*  --   HGB 10.9* 10.4*  HCT 33.4* 32.2*  MCV 91.8 93.1  PLT 294 288   Cardiac Enzymes: Recent Labs    05/26/23 0749  05/26/23 0950 05/26/23 1525  TROPONINIHS 316* 353* 462*   BNP: Recent Labs    05/26/23 0749  BNP 1,047.2*   D-Dimer: No results for input(s): "DDIMER" in the last 72 hours. Hemoglobin A1C: Recent Labs    05/26/23 0807  HGBA1C 7.6*   Fasting Lipid Panel: No results for input(s): "CHOL", "HDL", "LDLCALC", "TRIG", "CHOLHDL", "LDLDIRECT" in the last 72 hours. Thyroid Function Tests: No results for input(s): "TSH", "T4TOTAL", "T3FREE", "THYROIDAB" in the last 72 hours.  Invalid input(s): "FREET3" Anemia Panel: No results for input(s): "VITAMINB12", "FOLATE", "FERRITIN", "TIBC", "IRON", "RETICCTPCT" in the last 72 hours.   Radiology: DG Chest 1 View  Result Date: 05/27/2023 CLINICAL DATA:  CHF. EXAM: CHEST  1 VIEW COMPARISON:  05/26/2023 FINDINGS: The cardio pericardial silhouette is enlarged. Stable chronic interstitial changes at the lung bases. Right central line tip overlies the inferior SVC level. No acute bony abnormality. Telemetry leads overlie the chest. IMPRESSION: 1. No acute cardiopulmonary findings. 2. Right central line tip overlies the inferior SVC level. Electronically Signed   By: Kennith Center M.D.   On: 05/27/2023 07:48   ECHOCARDIOGRAM COMPLETE  Result Date: 05/26/2023    ECHOCARDIOGRAM REPORT   Patient Name:   JACON GENE Coalson Date of Exam: 05/26/2023 Medical Rec #:  [id number]       Height:       70.0 in Accession #:    [id number]  Weight:       307.8 lb Date of Birth:  05-02-1941      BSA:          2.507 m Patient Age:    81 years        BP:           170/93 mmHg Patient Gender: M               HR:           71 bpm. Exam Location:  [facility] Procedure: 2D Echo, Cardiac Doppler, Color Doppler and Intracardiac            Opacification Agent Indications:     CHF  History:         Patient has prior history of Echocardiogram examinations, most                  recent 12/17/2022. CHF, CAD, Signs/Symptoms:Edema; Risk                  Factors:Hypertension, Diabetes,  Dyslipidemia and Sleep Apnea.                  ESRD on Dialysis.  Sonographer:     Mikki Harbor Referring Phys:  [id number] Emeline General Diagnosing Phys: Lorine Bears MD  Sonographer Comments: Technically difficult study due to poor echo windows and patient is obese. IMPRESSIONS  1. Left ventricular ejection fraction, by estimation, is 30 to 35%. The left ventricle has moderately decreased function. Left ventricular endocardial border not optimally defined to evaluate regional wall motion. The left ventricular internal cavity size was mildly dilated. There is moderate left ventricular hypertrophy. Left ventricular diastolic parameters are indeterminate.  2. Right ventricular systolic function is normal. The right ventricular size is normal. Tricuspid regurgitation signal is inadequate for assessing PA pressure.  3. Left atrial size was moderately dilated.  4. Right atrial size was mildly dilated.  5. The mitral valve is normal in structure. Mild mitral valve regurgitation. No evidence of mitral stenosis.  6. The aortic valve is normal in structure. Aortic valve regurgitation is not visualized. Aortic valve sclerosis is present, with no evidence of aortic valve stenosis. Comparison(s): A prior study was performed on 11/17/22. Changes from prior study are noted. The left ventricular function is significantly worse. FINDINGS  Left Ventricle: Left ventricular ejection fraction, by estimation, is 30 to 35%. The left ventricle has moderately decreased function. Left ventricular endocardial border not optimally defined to evaluate regional wall motion. Definity contrast agent was given IV to delineate the left ventricular endocardial borders. The left ventricular internal cavity size was mildly dilated. There is moderate left ventricular hypertrophy. Left ventricular diastolic parameters are indeterminate. Right Ventricle: The right ventricular size is normal. No increase in right ventricular wall thickness. Right  ventricular systolic function is normal. Tricuspid regurgitation signal is inadequate for assessing PA pressure. The tricuspid regurgitant velocity is 2.18 m/s, and with an assumed right atrial pressure of 3 mmHg, the estimated right ventricular systolic pressure is 22.0 mmHg. Left Atrium: Left atrial size was moderately dilated. Right Atrium: Right atrial size was mildly dilated. Pericardium: Trivial pericardial effusion is present. Mitral Valve: The mitral valve is normal in structure. Mild mitral valve regurgitation. No evidence of mitral valve stenosis. MV peak gradient, 3.2 mmHg. The mean mitral valve gradient is 1.0 mmHg. Tricuspid Valve: The tricuspid valve is normal in structure. Tricuspid valve regurgitation is trivial. No evidence of tricuspid stenosis. Aortic Valve: The aortic valve  is normal in structure. Aortic valve regurgitation is not visualized. Aortic valve sclerosis is present, with no evidence of aortic valve stenosis. Aortic valve mean gradient measures 3.0 mmHg. Aortic valve peak gradient measures 8.6 mmHg. Aortic valve area, by VTI measures 1.95 cm. Pulmonic Valve: The pulmonic valve was normal in structure. Pulmonic valve regurgitation is not visualized. No evidence of pulmonic stenosis. Aorta: The aortic root is normal in size and structure. IAS/Shunts: No atrial level shunt detected by color flow Doppler.  LEFT VENTRICLE PLAX 2D LVIDd:         5.70 cm   Diastology LVIDs:         5.20 cm   LV e' medial:    5.22 cm/s LV PW:         1.20 cm   LV E/e' medial:  12.2 LV IVS:        1.40 cm   LV e' lateral:   5.00 cm/s LVOT diam:     2.10 cm   LV E/e' lateral: 12.7 LV SV:         67 LV SV Index:   27 LVOT Area:     3.46 cm  RIGHT VENTRICLE RV Basal diam:  4.50 cm RV Mid diam:    4.60 cm RV S prime:     16.80 cm/s LEFT ATRIUM              Index        RIGHT ATRIUM           Index LA diam:        4.10 cm  1.64 cm/m   RA Area:     23.70 cm LA Vol (A2C):   117.0 ml 46.67 ml/m  RA Volume:   74.20  ml  29.60 ml/m LA Vol (A4C):   86.7 ml  34.58 ml/m LA Biplane Vol: 110.0 ml 43.88 ml/m  AORTIC VALVE                    PULMONIC VALVE AV Area (Vmax):    2.40 cm     PV Vmax:       1.06 m/s AV Area (Vmean):   2.78 cm     PV Peak grad:  4.5 mmHg AV Area (VTI):     1.95 cm AV Vmax:           147.00 cm/s AV Vmean:          80.600 cm/s AV VTI:            0.342 m AV Peak Grad:      8.6 mmHg AV Mean Grad:      3.0 mmHg LVOT Vmax:         102.00 cm/s LVOT Vmean:        64.800 cm/s LVOT VTI:          0.193 m LVOT/AV VTI ratio: 0.56  AORTA Ao Root diam: 3.80 cm MITRAL VALVE               TRICUSPID VALVE MV Area (PHT): 2.91 cm    TR Peak grad:   19.0 mmHg MV Area VTI:   2.65 cm    TR Vmax:        218.00 cm/s MV Peak grad:  3.2 mmHg MV Mean grad:  1.0 mmHg    SHUNTS MV Vmax:       0.89 m/s    Systemic VTI:  0.19 m MV Vmean:  49.9 cm/s   Systemic Diam: 2.10 cm MV Decel Time: 261 msec MV E velocity: 63.70 cm/s MV A velocity: 44.00 cm/s MV E/A ratio:  1.45 Lorine Bears MD Electronically signed by Lorine Bears MD Signature Date/Time: 05/26/2023/5:43:57 PM    Final    DG Chest Port 1 View  Result Date: 05/26/2023 CLINICAL DATA:  Chest pain.  Shortness of breath. EXAM: PORTABLE CHEST 1 VIEW COMPARISON:  12/13/2022. FINDINGS: Low lung volume. There is stable mild increased bronchovascular markings without frank pulmonary edema. There are probable atelectatic changes at the lung bases. Bilateral lung fields are otherwise clear. There is blunting of bilateral costophrenic angles, which may represent small bilateral pleural effusions. Stable moderately enlarged cardio-mediastinal silhouette. No acute osseous abnormalities. The soft tissues are within normal limits. There is right IJ hemodialysis catheter with its tip overlying the cavoatrial junction region. IMPRESSION: *Low lung volume. No frank pulmonary edema. Probable bilateral pleural effusions and atelectatic changes at the lung bases. Right IJ hemodialysis  catheter noted. Electronically Signed   By: Jules Schick M.D.   On: 05/26/2023 08:54    ECHO as above  TELEMETRY reviewed by me 05/28/2023: sinus rhythm rate 70s, 13 beat NSVT late yesterday evening  EKG reviewed by me: NSR, rate 73 bpm  Data reviewed by me 05/28/2023: last 24h vitals tele labs imaging I/O ED provider note, admission H&P.  Cardiac Cath - 12/31/2021   Mid LAD lesion is 60% stenosed.   Prox RCA lesion is 20% stenosed.   Dist RCA lesion is 20% stenosed.   Ost Cx to Prox Cx lesion is 20% stenosed.   The left ventricular systolic function is normal.   LV end diastolic pressure is moderately elevated.   The left ventricular ejection fraction is greater than 65% by visual estimate.  Principal Problem:   CHF (congestive heart failure) (HCC) Active Problems:   Acute systolic CHF (congestive heart failure) (HCC)   Acute on chronic diastolic CHF (congestive heart failure) (HCC)   Chronic kidney disease (CKD), stage IV (severe) (HCC)    ASSESSMENT AND PLAN:  Fedor Kazmierski is a 82 y.o. male  with a past medical history of HFpEF (50-55%, 12/17/2022) aortic atherosclerosis, hypertension, sleep apnea on BiPAP, type 2 diabetes, hyperlipidemia, ESRD on hemodialysis 3x per week (M,W,F) who presented to the ED on 05/26/2023 for chest tightness and shortness of breath. Cardiology was consulted for further evaluation.   # Acute on chronic HFpEF (50-55% - 11/2022) # Demand ischemia # Hypertension Upon admission, patient reports worsening SOB and a few episodes of chest pressure associated with SOB. Patient denies worsening LE edema. Troponin trended 316 > 353. Patient with evidence of fluid overload with LE pitting edema and bilateral pleural effusions on CXR. S/p 1 dose of IV lasix 80 mg in the ED with good UOP. Troponin elevation likely 2/2 acute heart failure exacerbation. Echo this admission with EF 30-35%. Net negative 3.9 L.  -Continue po lasix 80 mg BID.  -Start entresto 24-26 mg  twice daily. Increase metoprolol succinate to 100 mg daily. Continue imdur 30 mg, can consider discontinuing if becomes hypotensive. Further additions to GDMT limited by BP and renal function at this time.   # Coronary artery disease # Hyperlipidemia  Patient with non-obstructive CAD by [facility] 12/2021 presenting with episode of CP and mildly elevated and flat troponin. EKG nonacute.  -Continue home pravastatin 40 mg daily and aspirin 81 mg daily.  -No plan for further cardiac diagnostics at this time.   #  ESRD on HD (M, W, F)  Patient started dialysis 1 month ago, reports having hypotension and hypoglycemia on days he has dialysis. -Monitor BP and BG closely due to recent episodes  -Monitor strict I's & O's, daily weights, and electrolytes -Nephrology following   This patient's plan of care was discussed and created with Dr. Melton Alar and she is in agreement.  Signed: Gale Journey, PA-C  05/28/2023, 12:28 PM [facility] Card[facility]

## 2023-05-28 NOTE — Progress Notes (Signed)
Progress Note   Patient: James Clarke HKV:[id number] DOB: Jan 13, 1941 DOA: 05/26/2023     2 DOS: the patient was seen and examined on 05/28/2023    Brief hospital course: HPI on admission 05/26/23:  " James Clarke is a 82 y.o. male with medical history significant of CKD stage V with recently started HD, chronic HFrEF with LVEF 45% 2023, refractory HTN, nonobstructive CAD, IDDM with diabetic neuropathy, morbid obesity, BPH, presented worsening of shortness of breath and new onset of chest pain.   Patient recently started hemodialysis MWF about 4 weeks ago.  At baseline patient used to have history of poorly controlled hypertension, the initial 2 weeks of dialysis patient experienced much improved blood pressure control.  However started about 2 weeks ago, patient started to have hypotension associated with dialysis and he was symptomatic with lightheadedness at the end of dialysis.  Last week his BP medication were adjusted to address the hypotension associated with HD, metoprolol dosage was cut down to half and amlodipine discontinued but only for dialysis days.  Despite the above-mentioned changes patient started have shortness of breath during the latter half of dialysis and has been recurrent persistently during dialysis for last 3 times.  Last night, patient started to have chest pains pressure-like 6-7/10, centrally located associated with increasing shortness of breath which woke him up around 3 AM.  Took 1 nitroglycerin and aspirin with no significant improvement.  Denied any cough no peripheral swelling.  He weighed himself every day and found his weight has been Invirase within 1 pound day today.   ED Course: Afebrile, blood pressure 160/80, O2 saturation 97% on room air.  Blood work showed creatinine 3.3 above his baseline glucose 220, WBC 13, hemoglobin 10.9, VBG 7.3 9/49/31.  Chest x-ray showing pulmonary congestion.  Troponin first at 300   EKG showed sinus rhythm, chronic ST changes  on multiple leads.  Patient was started on heparin drip and Lasix 80 mg x 1 in the ED.   Pt was admitted to hospital with Cardiology and Nephrology consulted. Pt underwent emergent dialysis on admission and was continued on further IV Lasix for diuresis. Due to hs-troponin elevation, started on heparin drip empirically.   Further hospital course and management as outlined below.       Assessment and Plan:   Anginal-like chest pain - suspect volume overload and HTN emergency Elevation of troponin due to demand ischemia  --Cardiology following --Heparin drip stopped Follow-up on echo report   Acute on chronic HFrEF --Volume mgmt by Diuresis and HD  Continue Imdur and spironolactone --Strict I/O's & daily weights --Monitor renal parameters, electrolytes --Cardiology following --Further GDMT per Cardiology based on BP's post-dialysis    HTN emergency --Pt underwent emergent dialysis on admission --On metoprolol, Imdur --Ov IV Lasix >>  PO today per Nephrology --PRN hydralazine   CKD stage V --Fluid overload, HD on admission emergently Plan of care discussed with nephrologist and patient is planned for hemodialysis today   Leukocytosis No symptoms signs of acute infection, suspect this is reactive  Monitor CBC --Defer antibiotics unless clear indication   Nonobstructive CAD Continue aspirin and statin   IDDM with hyperglycemia --Significant insulin resistance, continue Lantus 42 units daily and NovoLog 14 units 3 times daily AC --Sliding scale novolog --Tradjenta started on admission   Deconditioning Continue PT OT   Morbid obesity Body mass index is 43.15 kg/m. Lifestyle modification when medically stable     Subjective:  Physical Exam: General exam: awake, alert,  no acute distress, obese HEENT: moist mucus membranes, hearing grossly normal  Respiratory system: CTAB diminished bases, no wheezes, rales or rhonchi, normal respiratory effort. Cardiovascular  system: normal S1/S2, RRR, unable to visualize JVD, no pedal edema.  Right upper chest HD cath in place Gastrointestinal system: soft, NT, ND Central nervous system: A&O x3. no gross focal neurologic deficits, normal speech Extremities: moves all, no edema, normal tone Skin: dry, intact, normal temperature Psychiatry: normal mood, congruent affect, judgement and insight appear normal     Data Reviewed:      Latest Ref Rng & Units 05/27/2023    4:16 AM 05/26/2023    7:49 AM 04/21/2023    5:27 PM  CBC  WBC 4.0 - 10.5 K/uL 10.9  13.1  12.8   Hemoglobin 13.0 - 17.0 g/dL 81.1  91.4  78.2   Hematocrit 39.0 - 52.0 % 32.2  33.4  34.6   Platelets 150 - 400 K/uL 288  294  329          Latest Ref Rng & Units 05/28/2023    4:07 AM 05/27/2023    4:16 AM 05/26/2023    7:49 AM  BMP  Glucose 70 - 99 mg/dL 956  213  086   BUN 8 - 23 mg/dL 41  53  47   Creatinine 0.61 - 1.24 mg/dL 5.78  4.69  6.29   Sodium 135 - 145 mmol/L 136  139  137   Potassium 3.5 - 5.1 mmol/L 3.5  3.6  3.8   Chloride 98 - 111 mmol/L 99  101  100   CO2 22 - 32 mmol/L 28  27  25    Calcium 8.9 - 10.3 mg/dL 7.9  8.3  8.3     Family Communication: wife at bedside   Disposition: Status is: Inpatient Remains inpatient appropriate because: ongoing evaluation, needs further clincial improvement.  Discharge pending clearance by consultants    Planned Discharge Destination: Home       Time spent: 45 minutes   Vitals:   05/28/23 1630 05/28/23 1700 05/28/23 1731 05/28/23 1751  BP: 121/71 121/68 130/85   Pulse: 70 66 67   Resp: (!) 22 20 20    Temp:   98.2 F (36.8 C)   TempSrc:   Oral   SpO2: 98% 97% 96%   Weight:    134.2 kg  Height:         Author: Loyce Dys, MD 05/28/2023 6:07 PM  For on call review [email or web address].

## 2023-05-28 NOTE — TOC Initial Note (Signed)
Transition of Care [facility]) - Initial/Assessment Note    Patient Details  Name: James Clarke MRN: [id number] Date of Birth: 1940/09/10  Transition of Care [facility]) CM/SW Contact:    Margarito Liner, LCSW Phone Number: 05/28/2023, 12:25 PM  Clinical Narrative:   Readmission prevention screen complete. CSW met with patient. Wife at bedside. CSW introduced role and explained that discharge planning would be discussed. PCP is Maudie Flakes, MD. Patient primarily drives himself to appointments but wife drives if needed. He uses Child psychotherapist in [place]. No issues obtaining medications. Patient lives home with his wife. No home health prior to admission. He uses a SPC when outside the home but does not use DME to get around inside the home. He has two walkers, BSC, and crutches if needed. The person that built their house made it handicapped accessible. He has grab bars in the shower and by the toilet. No further concerns. CSW encouraged patient and his wife to contact CSW as needed. CSW will continue to follow patient and his wife for support and facilitate return home once stable. His wife will transport him home at discharge.               Expected Discharge Plan: Home/Self Care Barriers to Discharge: Continued Medical Work up   Patient Goals and CMS Choice            Expected Discharge Plan and Services     Post Acute Care Choice: NA Living arrangements for the past 2 months: Single Family Home                                      Prior Living Arrangements/Services Living arrangements for the past 2 months: Single Family Home Lives with:: Spouse Patient language and need for interpreter reviewed:: Yes Do you feel safe going back to the place where you live?: Yes      Need for Family Participation in Patient Care: Yes (Comment) Care giver support system in place?: Yes (comment) Current home services: DME Criminal Activity/Legal Involvement Pertinent to Current  Situation/Hospitalization: No - Comment as needed  Activities of Daily Living   ADL Screening (condition at time of admission) Independently performs ADLs?: No Does the patient have a NEW difficulty with bathing/dressing/toileting/self-feeding that is expected to last >3 days?: No Does the patient have a NEW difficulty with getting in/out of bed, walking, or climbing stairs that is expected to last >3 days?: No Does the patient have a NEW difficulty with communication that is expected to last >3 days?: No Is the patient deaf or have difficulty hearing?: No Does the patient have difficulty seeing, even when wearing glasses/contacts?: No Does the patient have difficulty concentrating, remembering, or making decisions?: No  Permission Sought/Granted Permission sought to share information with : Family Supports Permission granted to share information with : Yes, Verbal Permission Granted  Share Information with NAME: Delores Kackley     Permission granted to share info w Relationship: Wife  Permission granted to share info w Contact Information: [phone]  Emotional Assessment Appearance:: Appears stated age Attitude/Demeanor/Rapport: Engaged, Gracious Affect (typically observed): Accepting, Appropriate, Calm, Pleasant Orientation: : Oriented to Self, Oriented to Place, Oriented to  Time, Oriented to Situation Alcohol / Substance Use: Not Applicable Psych Involvement: No (comment)  Admission diagnosis:  CHF (congestive heart failure) (HCC) [I50.9] NSTEMI (non-ST elevated myocardial infarction) (HCC) [I21.4] Acute on chronic congestive heart failure,  unspecified heart failure type [facility]) [I50.9] Patient Active Problem List   Diagnosis Date Noted   CHF (congestive heart failure) (HCC) 05/26/2023   ESRD on dialysis (HCC) 04/29/2023   Hypokalemia 12/16/2022   Myocardial injury 12/14/2022   Goals of care, counseling/discussion 12/13/2022   Chronic kidney disease (CKD), stage IV (severe)  (HCC) 08/23/2022   Acute on chronic diastolic CHF (congestive heart failure) (HCC) 06/12/2022   Swelling of limb 04/30/2022   Obstructive uropathy 03/10/2022   Diarrhea 03/04/2022   Skin irritation 03/01/2022   UTI (urinary tract infection) 02/28/2022   Hydronephrosis of left kidney    Flank pain 02/25/2022   Chronic diastolic CHF (congestive heart failure) (HCC) 02/25/2022   Acute renal failure superimposed on chronic kidney disease (HCC) 02/25/2022   Coronary artery disease involving native coronary artery of native heart 01/10/2022   Right rotator cuff tear arthropathy 06/25/2021   Chronic right shoulder pain 06/25/2021   Localized osteoarthritis of right shoulder 06/25/2021   Chronic pain syndrome 06/25/2021   Acute systolic CHF (congestive heart failure) (HCC) 10/03/2020   Kidney stones 02/16/2020   Anemia in chronic kidney disease 07/26/2019   Chronic kidney disease, stage III (moderate) (HCC) 07/26/2019   Edema of lower extremity 07/26/2019   Malignant essential hypertension 07/26/2019   Secondary hyperparathyroidism of renal origin (HCC) 07/26/2019   Left ureteral calculus 07/26/2019   Old complex tear of medial meniscus of right knee 10/07/2018   Primary osteoarthritis of right knee 10/07/2018   Fatty liver 04/16/2018   Aortic atherosclerosis (HCC) 04/16/2018   Obesity, Class III, BMI 40-49.9 (morbid obesity) (HCC) 11/11/2014   Microalbuminuria 11/11/2014   Long-term insulin use (HCC) 11/11/2014   Perennial allergic rhinitis 09/12/2014   Type 2 diabetes mellitus with renal manifestations (HCC) 03/19/2014   Sleep apnea 03/19/2014   Hypertension 03/19/2014   Hyperlipemia 03/19/2014   PCP:  Marina Goodell, MD Pharmacy:   [facility] [phone] [place], [place] - [address] [place] [address] Phone: [phone] Fax: [phone]  [facility] [place], [place] - [address] [place] [address] Phone: [phone] Fax:  [phone]  [id number], [place] - [address] AT [facility] [address] [facility] [place] [address] Phone: [phone] Fax: [phone]     Social Determinants of Health (SDOH) Social History: SDOH Screenings   Food Insecurity: No Food Insecurity (05/26/2023)  Housing: Patient Unable To Answer (05/26/2023)  Transportation Needs: No Transportation Needs (05/26/2023)  Utilities: Not At Risk (05/26/2023)  Depression (PHQ2-9): Low Risk  (10/30/2021)  Financial Resource Strain: Low Risk  (12/20/2022)   Received from [facility]  Tobacco Use: Low Risk  (05/26/2023)   SDOH Interventions:     Readmission Risk Interventions    05/28/2023   12:24 PM 02/27/2022    4:22 PM  Readmission Risk Prevention Plan  Transportation Screening Complete Complete  PCP or Specialist Appt within 3-5 Days Complete   HRI or Home Care Consult  Complete  Social Work Consult for Recovery Care Planning/Counseling Complete   Palliative Care Screening Not Applicable Not Applicable  Medication Review Oceanographer) Complete Complete

## 2023-05-29 DIAGNOSIS — I509 Heart failure, unspecified: Secondary | ICD-10-CM | POA: Diagnosis not present

## 2023-05-29 DIAGNOSIS — I214 Non-ST elevation (NSTEMI) myocardial infarction: Secondary | ICD-10-CM | POA: Diagnosis not present

## 2023-05-29 LAB — CBC WITH DIFFERENTIAL/PLATELET
Abs Immature Granulocytes: 0.07 10*3/uL (ref 0.00–0.07)
Basophils Absolute: 0.1 10*3/uL (ref 0.0–0.1)
Basophils Relative: 1 %
Eosinophils Absolute: 0.6 10*3/uL — ABNORMAL HIGH (ref 0.0–0.5)
Eosinophils Relative: 5 %
HCT: 36.2 % — ABNORMAL LOW (ref 39.0–52.0)
Hemoglobin: 11.7 g/dL — ABNORMAL LOW (ref 13.0–17.0)
Immature Granulocytes: 1 %
Lymphocytes Relative: 20 %
Lymphs Abs: 2.3 10*3/uL (ref 0.7–4.0)
MCH: 29.3 pg (ref 26.0–34.0)
MCHC: 32.3 g/dL (ref 30.0–36.0)
MCV: 90.5 fL (ref 80.0–100.0)
Monocytes Absolute: 1 10*3/uL (ref 0.1–1.0)
Monocytes Relative: 9 %
Neutro Abs: 7.7 10*3/uL (ref 1.7–7.7)
Neutrophils Relative %: 64 %
Platelets: 297 10*3/uL (ref 150–400)
RBC: 4 MIL/uL — ABNORMAL LOW (ref 4.22–5.81)
RDW: 14.5 % (ref 11.5–15.5)
WBC: 11.7 10*3/uL — ABNORMAL HIGH (ref 4.0–10.5)
nRBC: 0 % (ref 0.0–0.2)

## 2023-05-29 LAB — BASIC METABOLIC PANEL
Anion gap: 9 (ref 5–15)
BUN: 34 mg/dL — ABNORMAL HIGH (ref 8–23)
CO2: 25 mmol/L (ref 22–32)
Calcium: 8.1 mg/dL — ABNORMAL LOW (ref 8.9–10.3)
Chloride: 98 mmol/L (ref 98–111)
Creatinine, Ser: 2.52 mg/dL — ABNORMAL HIGH (ref 0.61–1.24)
GFR, Estimated: 25 mL/min — ABNORMAL LOW (ref >60)
Glucose, Bld: 200 mg/dL — ABNORMAL HIGH (ref 70–99)
Potassium: 3.7 mmol/L (ref 3.5–5.1)
Sodium: 132 mmol/L — ABNORMAL LOW (ref 135–145)

## 2023-05-29 LAB — GLUCOSE, CAPILLARY
Glucose-Capillary: 211 mg/dL — ABNORMAL HIGH (ref 70–99)
Glucose-Capillary: 357 mg/dL — ABNORMAL HIGH (ref 70–99)

## 2023-05-29 MED ORDER — METOPROLOL SUCCINATE ER 50 MG PO TB24: 100.0000 mg | ORAL_TABLET | Freq: Every day | ORAL | 0 refills | Status: DC

## 2023-05-29 MED ORDER — SACUBITRIL-VALSARTAN 24-26 MG PO TABS: 1.0000 | ORAL_TABLET | Freq: Two times a day (BID) | ORAL | 2 refills | Status: DC

## 2023-05-29 MED ORDER — LINAGLIPTIN 5 MG PO TABS: 5.0000 mg | ORAL_TABLET | Freq: Every day | ORAL | 0 refills | Status: DC

## 2023-05-29 MED ORDER — FUROSEMIDE 40 MG PO TABS: 80.0000 mg | ORAL_TABLET | Freq: Two times a day (BID) | ORAL | 0 refills | Status: AC

## 2023-05-29 NOTE — Progress Notes (Signed)
[facility] CARDIOLOGY PROGRESS NOTE       Patient ID: James Clarke MRN: [id number] DOB/AGE: [birth year] 82 y.o.  Admit date: 05/26/2023 Referring Physician Mikey College, MD Primary Physician Feldpausch, Madaline Guthrie, MD Primary Cardiologist [name], PA-C Reason for Consultation Heart Failure  HPI: James Clarke is a 82 y.o. male  with a past medical history of HFpEF (50-55%, 12/17/2022) aortic atherosclerosis, CAD with LHC 12/2021, hypertension, sleep apnea on BiPAP, type 2 diabetes, hyperlipidemia, ESRD on HD (M,W,F) who presented to the ED on 05/26/2023 for chest tightness and shortness of breath. Cardiology was consulted for further evaluation.   Interval history: -Patient feeling much better overall. No SOB at rest. Has stable DOE, this feels at baseline for him.  -BP remains controlled, appears to be tolerating additions to meds well.  -Denies any chest pain or palpitations.   Review of systems complete and found to be negative unless listed above    Past Medical History:  Diagnosis Date   (HFpEF) heart failure with preserved ejection fraction (HCC)    a.) TTE 11/26/2021: EF 45%, mod LVH, post HK, mod LAE, mild MR/TR, G2DD; b.) TTE 02/26/2022: EF 55-60%, BAE, triv MR, G2DD; c.) TTE 07/09/2022: EF 45%, post HK, LVH, mod LAE, triv TR/PR, mild MR, G1DD   Anemia of chronic renal failure    Aortic atherosclerosis (HCC)    Asthma    Bell's palsy    BPH (benign prostatic hyperplasia)    CAD (coronary artery disease)    a.) LHC 12/31/2021: 60% mLAD, 20% pRCA, 20% dRCA, 20% o-pLCx --> med mgmt.   CKD (chronic kidney disease), stage IV (HCC)    Diabetic neuropathy (HCC)    Dyspnea on exertion    Ganglion cyst of wrist, right    a.) s/p excision 04/2011   Hepatic steatosis    History of 2019 novel coronavirus disease (COVID-19)    History of bilateral cataract extraction 2018   Hyperlipidemia    Hyperparathyroidism due to renal insufficiency (HCC)    Hypertension    Low  testosterone in male    Nephrolithiasis    Obesity    OSA treated with BiPAP    Osteoarthritis    Peripheral edema    Right inguinal hernia    a.) s/p repair   Skin cancer    Type 2 diabetes mellitus with renal manifestations [facility])     Past Surgical History:  Procedure Laterality Date   A/V FISTULAGRAM Left 01/13/2023   Procedure: A/V Fistulagram;  Surgeon: Annice Needy, MD;  Location: [facility] INVASIVE CV LAB;  Service: Cardiovascular;  Laterality: Left;   A/V SHUNT INTERVENTION Left 04/11/2023   Procedure: A/V SHUNT INTERVENTION;  Surgeon: Annice Needy, MD;  Location: [facility] INVASIVE CV LAB;  Service: Cardiovascular;  Laterality: Left;   APPENDECTOMY     AV FISTULA PLACEMENT Left 09/19/2022   Procedure: ARTERIOVENOUS (AV) FISTULA CREATION (RADIO CEPHALIC);  Surgeon: Annice Needy, MD;  Location: [facility] ORS;  Service: Vascular;  Laterality: Left;   CATARACT EXTRACTION W/PHACO Left 05/20/2017   Procedure: CATARACT EXTRACTION PHACO AND INTRAOCULAR LENS PLACEMENT (IOC);  Surgeon: Galen Manila, MD;  Location: [facility] ORS;  Service: Ophthalmology;  Laterality: Left;  [place] 00:32.0 AP% 15.5 CDE 4.97 Fluid Pack Lot # [id number]   CATARACT EXTRACTION W/PHACO Right 06/10/2017   Procedure: CATARACT EXTRACTION PHACO AND INTRAOCULAR LENS PLACEMENT (IOC);  Surgeon: Galen Manila, MD;  Location: [facility] ORS;  Service: Ophthalmology;  Laterality: Right;  [place]  00:51 AP% 15.4  CDE 7.95 Fluid pack lot # [id number]   CHOLECYSTECTOMY     COLONOSCOPY     x3   CYSTOSCOPY W/ RETROGRADES Right 08/13/2019   Procedure: CYSTOSCOPY WITH RETROGRADE PYELOGRAM;  Surgeon: Sondra Come, MD;  Location: [facility] ORS;  Service: Urology;  Laterality: Right;   CYSTOSCOPY WITH STENT PLACEMENT Right 07/26/2019   Procedure: CYSTOSCOPY WITH STENT PLACEMENT;  Surgeon: Crista Elliot, MD;  Location: [facility] ORS;  Service: Urology;  Laterality: Right;   CYSTOSCOPY/URETEROSCOPY/HOLMIUM LASER/STENT PLACEMENT Right 08/13/2019   Procedure:  CYSTOSCOPY/URETEROSCOPY/HOLMIUM LASER/STENT EXCHANGE;  Surgeon: Sondra Come, MD;  Location: [facility] ORS;  Service: Urology;  Laterality: Right;   CYSTOSCOPY/URETEROSCOPY/HOLMIUM LASER/STENT PLACEMENT Left 02/27/2022   Procedure: CYSTOSCOPY/URETEROSCOPY/HOLMIUM LASER/STENT PLACEMENT;  Surgeon: Riki Altes, MD;  Location: [facility] ORS;  Service: Urology;  Laterality: Left;   DIALYSIS/PERMA CATHETER INSERTION N/A 04/11/2023   Procedure: DIALYSIS/PERMA CATHETER INSERTION;  Surgeon: Annice Needy, MD;  Location: [facility] INVASIVE CV LAB;  Service: Cardiovascular;  Laterality: N/A;   GANGLION CYST EXCISION Right    INGUINAL HERNIA REPAIR Right    LEFT HEART CATH AND CORONARY ANGIOGRAPHY N/A 12/31/2021   Procedure: LEFT HEART CATH AND CORONARY ANGIOGRAPHY;  Surgeon: Lamar Blinks, MD;  Location: [facility] INVASIVE CV LAB;  Service: Cardiovascular;  Laterality: N/A;   SHOULDER ARTHROSCOPY WITH SUBACROMIAL DECOMPRESSION AND OPEN ROTATOR C Right 09/11/2020   Procedure: Right shoulder arthroscopic rotator cuff repair vs Regeneten patch application, subacromial decompression, and biceps tenodesis - Dedra Skeens to Assist;  Surgeon: Signa Kell, MD;  Location: [facility] ORS;  Service: Orthopedics;  Laterality: Right;   TEMPORARY DIALYSIS CATHETER N/A 03/05/2022   Procedure: TEMPORARY DIALYSIS CATHETER;  Surgeon: Renford Dills, MD;  Location: [facility] INVASIVE CV LAB;  Service: Cardiovascular;  Laterality: N/A;   TONSILLECTOMY     URETEROSCOPY WITH HOLMIUM LASER LITHOTRIPSY Left 02/27/2022   Procedure: URETEROSCOPY WITH HOLMIUM LASER LITHOTRIPSY;  Surgeon: Riki Altes, MD;  Location: [facility] ORS;  Service: Urology;  Laterality: Left;   WRIST FRACTURE SURGERY Right     Medications Prior to Admission  Medication Sig Dispense Refill Last Dose   acetaminophen (TYLENOL) 500 MG tablet Take 1,000 mg by mouth every 6 (six) hours as needed (shoulder pain).   prn at unk   albuterol (PROVENTIL HFA;VENTOLIN HFA) 108 (90 Base)  MCG/ACT inhaler Inhale 2 puffs into the lungs every 6 (six) hours as needed for wheezing or shortness of breath.   prn at unk   aspirin EC 81 MG tablet Take by mouth.   05/25/2023   calcitRIOL (ROCALTROL) 0.25 MCG capsule Take 0.25 mcg by mouth daily.   05/25/2023   cetirizine (ZYRTEC) 10 MG chewable tablet Chew 10 mg by mouth daily as needed for allergies.   05/25/2023   diazepam (VALIUM) 2 MG tablet Take 2 mg by mouth every 12 (twelve) hours as needed.   prn at unk   fluticasone (FLONASE) 50 MCG/ACT nasal spray as needed.   prn at unk   furosemide (LASIX) 40 MG tablet Take 80 mg q am and 40 mg qpm (Patient taking differently: Take 80 mg by mouth 2 (two) times daily. On non dialysis days, Sunday, Tuesday, Thursday, Saturday and Sunday) 60 tablet 2 05/25/2023   insulin aspart (NOVOLOG) 100 UNIT/ML injection Inject 14 Units into the skin 3 (three) times daily with meals. (Patient taking differently: Inject 35 Units into the skin 3 (three) times daily with meals.) 10 mL 11 05/25/2023 at pm   insulin detemir (LEVEMIR)  100 UNIT/ML injection Inject 0.5 mLs (50 Units total) into the skin at bedtime. (Patient taking differently: Inject 35 Units into the skin at bedtime.) 10 mL 1 05/25/2023   isosorbide mononitrate (IMDUR) 30 MG 24 hr tablet Take 1 tablet (30 mg total) by mouth daily. 30 tablet 2 05/25/2023   lovastatin (MEVACOR) 40 MG tablet Take 40 mg by mouth daily with supper.   05/25/2023   metoprolol succinate (TOPROL-XL) 50 MG 24 hr tablet Take 50 mg by mouth daily. Do not take on Monday, Wednesday and Friday on dialysis days   05/25/2023 at am   midodrine (PROAMATINE) 10 MG tablet Take 10 mg by mouth daily. Take on dialysis days, Monday, Wednesday and Friday.   05/23/2023   nitroGLYCERIN (NITROSTAT) 0.4 MG SL tablet Place 1 tablet (0.4 mg total) under the tongue every 5 (five) minutes as needed for chest pain. 20 tablet 1 05/26/2023   potassium citrate (UROCIT-K) 10 MEQ (1080 MG) SR tablet TAKE (1) TABLET BY  MOUTH TWICE DAILY WITH A MEAL. 60 tablet 11 05/25/2023   sertraline (ZOLOFT) 50 MG tablet Take 50 mg by mouth at bedtime.   05/25/2023   tamsulosin (FLOMAX) 0.4 MG CAPS capsule TAKE (1) CAPSULE BY MOUTH EVERY DAY 30 capsule 1 05/25/2023   Continuous Blood Gluc Sensor (FREESTYLE LIBRE 14 DAY SENSOR) MISC by Does not apply route.      GLOBAL INJECT EASE INSULIN SYR 31G X 5/16" 0.5 ML MISC Inject into the skin.      glucose blood (ONETOUCH ULTRA) test strip TEST BLOOD SUGAR 4 TIMES DAILY      Lancets (ONETOUCH DELICA PLUS LANCET30G) MISC USE 1 LANCET 4 TIMES DAILY      spironolactone (ALDACTONE) 50 MG tablet Take 50 mg by mouth daily. (Patient not taking: Reported on 05/26/2023)   Not Taking   Social History   Socioeconomic History   Marital status: Married    Spouse name: Not on file   Number of children: Not on file   Years of education: Not on file   Highest education level: Not on file  Occupational History   Not on file  Tobacco Use   Smoking status: Never    Passive exposure: Never   Smokeless tobacco: Never  Vaping Use   Vaping status: Never Used  Substance and Sexual Activity   Alcohol use: No   Drug use: Not Currently   Sexual activity: Yes    Birth control/protection: None  Other Topics Concern   Not on file  Social History Narrative   Not on file   Social Determinants of Health   Financial Resource Strain: Low Risk  (12/20/2022)   [facility], [facility]   Overall Financial Resource Strain (CARDIA)    Difficulty of Paying Living Expenses: Not hard at all  Food Insecurity: No Food Insecurity (05/26/2023)   Hunger Vital Sign    Worried About Running Out of Food in the Last Year: Never true    Ran Out of Food in the Last Year: Never true  Transportation Needs: No Transportation Needs (05/26/2023)   PRAPARE - Administrator, Civil Service (Medical): No    Lack of Transportation (Non-Medical): No  Physical  Activity: Not on file  Stress: Not on file  Social Connections: Not on file  Intimate Partner Violence: Not At Risk (05/26/2023)   Humiliation, Afraid, Rape, and Kick questionnaire    Fear of Current or Ex-Partner: No  Emotionally Abused: No    Physically Abused: No    Sexually Abused: No    Family History  Problem Relation Age of Onset   Emphysema Mother    COPD Mother    Heart disease Mother    Brain cancer Father      Vitals:   05/29/23 0046 05/29/23 0444 05/29/23 0458 05/29/23 0812  BP: 126/62 137/65  133/60  Pulse: (!) 57 (!) 51  60  Resp: 20 18  18   Temp: (!) 97.3 F (36.3 C) 97.6 F (36.4 C)  98.1 F (36.7 C)  TempSrc:    Oral  SpO2: 97% 97%  97%  Weight:   134.7 kg   Height:        PHYSICAL EXAM General: chronically-ill appearing elderly male, well nourished, in no acute distress laying at a slight incline in hospital bed. HEENT: Normocephalic and atraumatic. Neck: No JVD.   Lungs: Normal respiratory effort on RA. Diminished breath sounds bilaterally. Heart: HRRR. Normal S1 and S2 without gallops or murmurs.  Abdomen: Non-distended appearing.  Msk: Normal strength and tone for age. Extremities: Warm and well perfused. No clubbing, cyanosis. No edema.  Neuro: Alert and oriented X 3. Psych: Answers questions appropriately.   Labs: Basic Metabolic Panel: Recent Labs    05/28/23 0407 05/29/23 0333  NA 136 132*  K 3.5 3.7  CL 99 98  CO2 28 25  GLUCOSE 219* 200*  BUN 41* 34*  CREATININE 2.74* 2.52*  CALCIUM 7.9* 8.1*   Liver Function Tests: No results for input(s): "AST", "ALT", "ALKPHOS", "BILITOT", "PROT", "ALBUMIN" in the last 72 hours.  No results for input(s): "LIPASE", "AMYLASE" in the last 72 hours.  CBC: Recent Labs    05/27/23 0416 05/29/23 0452  WBC 10.9* 11.7*  NEUTROABS  --  7.7  HGB 10.4* 11.7*  HCT 32.2* 36.2*  MCV 93.1 90.5  PLT 288 297   Cardiac Enzymes: Recent Labs    05/26/23 1525  TROPONINIHS 462*   BNP: No  results for input(s): "BNP" in the last 72 hours.  D-Dimer: No results for input(s): "DDIMER" in the last 72 hours. Hemoglobin A1C: No results for input(s): "HGBA1C" in the last 72 hours.  Fasting Lipid Panel: No results for input(s): "CHOL", "HDL", "LDLCALC", "TRIG", "CHOLHDL", "LDLDIRECT" in the last 72 hours. Thyroid Function Tests: No results for input(s): "TSH", "T4TOTAL", "T3FREE", "THYROIDAB" in the last 72 hours.  Invalid input(s): "FREET3" Anemia Panel: No results for input(s): "VITAMINB12", "FOLATE", "FERRITIN", "TIBC", "IRON", "RETICCTPCT" in the last 72 hours.   Radiology: DG Chest 1 View  Result Date: 05/27/2023 CLINICAL DATA:  CHF. EXAM: CHEST  1 VIEW COMPARISON:  05/26/2023 FINDINGS: The cardio pericardial silhouette is enlarged. Stable chronic interstitial changes at the lung bases. Right central line tip overlies the inferior SVC level. No acute bony abnormality. Telemetry leads overlie the chest. IMPRESSION: 1. No acute cardiopulmonary findings. 2. Right central line tip overlies the inferior SVC level. Electronically Signed   By: Kennith Center M.D.   On: 05/27/2023 07:48   ECHOCARDIOGRAM COMPLETE  Result Date: 05/26/2023    ECHOCARDIOGRAM REPORT   Patient Name:   James Clarke Date of Exam: 05/26/2023 Medical Rec #:  [id number]       Height:       70.0 in Accession #:    [id number]      Weight:       307.8 lb Date of Birth:  [birth year]      BSA:  2.507 m Patient Age:    81 years        BP:           170/93 mmHg Patient Gender: M               HR:           71 bpm. Exam Location:  [facility] Procedure: 2D Echo, Cardiac Doppler, Color Doppler and Intracardiac            Opacification Agent Indications:     CHF  History:         Patient has prior history of Echocardiogram examinations, most                  recent 12/17/2022. CHF, CAD, Signs/Symptoms:Edema; Risk                  Factors:Hypertension, Diabetes, Dyslipidemia and Sleep Apnea.                  ESRD on Dialysis.   Sonographer:     Mikki Harbor Referring Phys:  [id number] Emeline General Diagnosing Phys: Lorine Bears MD  Sonographer Comments: Technically difficult study due to poor echo windows and patient is obese. IMPRESSIONS  1. Left ventricular ejection fraction, by estimation, is 30 to 35%. The left ventricle has moderately decreased function. Left ventricular endocardial border not optimally defined to evaluate regional wall motion. The left ventricular internal cavity size was mildly dilated. There is moderate left ventricular hypertrophy. Left ventricular diastolic parameters are indeterminate.  2. Right ventricular systolic function is normal. The right ventricular size is normal. Tricuspid regurgitation signal is inadequate for assessing PA pressure.  3. Left atrial size was moderately dilated.  4. Right atrial size was mildly dilated.  5. The mitral valve is normal in structure. Mild mitral valve regurgitation. No evidence of mitral stenosis.  6. The aortic valve is normal in structure. Aortic valve regurgitation is not visualized. Aortic valve sclerosis is present, with no evidence of aortic valve stenosis. Comparison(s): A prior study was performed on 11/17/22. Changes from prior study are noted. The left ventricular function is significantly worse. FINDINGS  Left Ventricle: Left ventricular ejection fraction, by estimation, is 30 to 35%. The left ventricle has moderately decreased function. Left ventricular endocardial border not optimally defined to evaluate regional wall motion. Definity contrast agent was given IV to delineate the left ventricular endocardial borders. The left ventricular internal cavity size was mildly dilated. There is moderate left ventricular hypertrophy. Left ventricular diastolic parameters are indeterminate. Right Ventricle: The right ventricular size is normal. No increase in right ventricular wall thickness. Right ventricular systolic function is normal. Tricuspid regurgitation signal  is inadequate for assessing PA pressure. The tricuspid regurgitant velocity is 2.18 m/s, and with an assumed right atrial pressure of 3 mmHg, the estimated right ventricular systolic pressure is 22.0 mmHg. Left Atrium: Left atrial size was moderately dilated. Right Atrium: Right atrial size was mildly dilated. Pericardium: Trivial pericardial effusion is present. Mitral Valve: The mitral valve is normal in structure. Mild mitral valve regurgitation. No evidence of mitral valve stenosis. MV peak gradient, 3.2 mmHg. The mean mitral valve gradient is 1.0 mmHg. Tricuspid Valve: The tricuspid valve is normal in structure. Tricuspid valve regurgitation is trivial. No evidence of tricuspid stenosis. Aortic Valve: The aortic valve is normal in structure. Aortic valve regurgitation is not visualized. Aortic valve sclerosis is present, with no evidence of aortic valve stenosis. Aortic valve mean gradient measures 3.0 mmHg.  Aortic valve peak gradient measures 8.6 mmHg. Aortic valve area, by VTI measures 1.95 cm. Pulmonic Valve: The pulmonic valve was normal in structure. Pulmonic valve regurgitation is not visualized. No evidence of pulmonic stenosis. Aorta: The aortic root is normal in size and structure. IAS/Shunts: No atrial level shunt detected by color flow Doppler.  LEFT VENTRICLE PLAX 2D LVIDd:         5.70 cm   Diastology LVIDs:         5.20 cm   LV e' medial:    5.22 cm/s LV PW:         1.20 cm   LV E/e' medial:  12.2 LV IVS:        1.40 cm   LV e' lateral:   5.00 cm/s LVOT diam:     2.10 cm   LV E/e' lateral: 12.7 LV SV:         67 LV SV Index:   27 LVOT Area:     3.46 cm  RIGHT VENTRICLE RV Basal diam:  4.50 cm RV Mid diam:    4.60 cm RV S prime:     16.80 cm/s LEFT ATRIUM              Index        RIGHT ATRIUM           Index LA diam:        4.10 cm  1.64 cm/m   RA Area:     23.70 cm LA Vol (A2C):   117.0 ml 46.67 ml/m  RA Volume:   74.20 ml  29.60 ml/m LA Vol (A4C):   86.7 ml  34.58 ml/m LA Biplane Vol:  110.0 ml 43.88 ml/m  AORTIC VALVE                    PULMONIC VALVE AV Area (Vmax):    2.40 cm     PV Vmax:       1.06 m/s AV Area (Vmean):   2.78 cm     PV Peak grad:  4.5 mmHg AV Area (VTI):     1.95 cm AV Vmax:           147.00 cm/s AV Vmean:          80.600 cm/s AV VTI:            0.342 m AV Peak Grad:      8.6 mmHg AV Mean Grad:      3.0 mmHg LVOT Vmax:         102.00 cm/s LVOT Vmean:        64.800 cm/s LVOT VTI:          0.193 m LVOT/AV VTI ratio: 0.56  AORTA Ao Root diam: 3.80 cm MITRAL VALVE               TRICUSPID VALVE MV Area (PHT): 2.91 cm    TR Peak grad:   19.0 mmHg MV Area VTI:   2.65 cm    TR Vmax:        218.00 cm/s MV Peak grad:  3.2 mmHg MV Mean grad:  1.0 mmHg    SHUNTS MV Vmax:       0.89 m/s    Systemic VTI:  0.19 m MV Vmean:      49.9 cm/s   Systemic Diam: 2.10 cm MV Decel Time: 261 msec MV E velocity: 63.70 cm/s MV A velocity: 44.00 cm/s MV E/A ratio:  1.45  Lorine Bears MD Electronically signed by Lorine Bears MD Signature Date/Time: 05/26/2023/5:43:57 PM    Final    DG Chest Port 1 View  Result Date: 05/26/2023 CLINICAL DATA:  Chest pain.  Shortness of breath. EXAM: PORTABLE CHEST 1 VIEW COMPARISON:  12/13/2022. FINDINGS: Low lung volume. There is stable mild increased bronchovascular markings without frank pulmonary edema. There are probable atelectatic changes at the lung bases. Bilateral lung fields are otherwise clear. There is blunting of bilateral costophrenic angles, which may represent small bilateral pleural effusions. Stable moderately enlarged cardio-mediastinal silhouette. No acute osseous abnormalities. The soft tissues are within normal limits. There is right IJ hemodialysis catheter with its tip overlying the cavoatrial junction region. IMPRESSION: *Low lung volume. No frank pulmonary edema. Probable bilateral pleural effusions and atelectatic changes at the lung bases. Right IJ hemodialysis catheter noted. Electronically Signed   By: Jules Schick M.D.   On:  05/26/2023 08:54    ECHO as above  TELEMETRY reviewed by me 05/29/2023: sinus rhythm rate 60s, brady in the upper 40s overnight. Decreased incidence of PVCs  EKG reviewed by me: NSR, rate 73 bpm  Data reviewed by me 05/29/2023: last 24h vitals tele labs imaging I/O ED provider note, admission H&P.  Cardiac Cath - 12/31/2021   Mid LAD lesion is 60% stenosed.   Prox RCA lesion is 20% stenosed.   Dist RCA lesion is 20% stenosed.   Ost Cx to Prox Cx lesion is 20% stenosed.   The left ventricular systolic function is normal.   LV end diastolic pressure is moderately elevated.   The left ventricular ejection fraction is greater than 65% by visual estimate.  Principal Problem:   CHF (congestive heart failure) (HCC) Active Problems:   Acute systolic CHF (congestive heart failure) (HCC)   Acute on chronic diastolic CHF (congestive heart failure) (HCC)   Chronic kidney disease (CKD), stage IV (severe) (HCC)    ASSESSMENT AND PLAN:  James Clarke is a 82 y.o. male  with a past medical history of HFpEF (50-55%, 12/17/2022) aortic atherosclerosis, hypertension, sleep apnea on BiPAP, type 2 diabetes, hyperlipidemia, ESRD on hemodialysis 3x per week (M,W,F) who presented to the ED on 05/26/2023 for chest tightness and shortness of breath. Cardiology was consulted for further evaluation.   # Acute on chronic HFpEF (50-55% - 11/2022) # Demand ischemia # Hypertension Upon admission, patient reports worsening SOB and a few episodes of chest pressure associated with SOB. Patient denies worsening LE edema. Troponin trended 316 > 353. Patient with evidence of fluid overload with LE pitting edema and bilateral pleural effusions on CXR. S/p 1 dose of IV lasix 80 mg in the ED with good UOP. Troponin elevation likely 2/2 acute heart failure exacerbation. Echo this admission with EF 30-35%. Net negative 3.9 L.  -Continue po lasix 80 mg BID.  -Continue entresto 24-26 mg twice daily and metoprolol succinate 100  mg daily. Continue imdur 30 mg, can consider discontinuing if becomes hypotensive. Further additions to GDMT limited by BP and renal function at this time.   # Coronary artery disease # Hyperlipidemia  Patient with non-obstructive CAD by [facility] 12/2021 presenting with episode of CP and mildly elevated and flat troponin. EKG nonacute.  -Continue home pravastatin 40 mg daily and aspirin 81 mg daily.  -No plan for further cardiac diagnostics at this time.   #ESRD on HD (M, W, F)  Patient started dialysis 1 month ago, reports having hypotension and hypoglycemia on days he has dialysis. -Monitor BP  and BG closely due to recent episodes  -Monitor strict I's & O's, daily weights, and electrolytes -Nephrology following  Ok for discharge today from a cardiac perspective. Will arrange for follow up in clinic with [name], PA in 1-2 weeks.    This patient's plan of care was discussed and created with Dr. Melton Alar and she is in agreement.  Signed: Gale Journey, PA-C  05/29/2023, 10:59 AM [facility] Cardiology

## 2023-05-29 NOTE — Progress Notes (Signed)
Occupational Therapy Treatment Patient Details Name: James Clarke MRN: [id number] DOB: Feb 18, 1941 Today's Date: 05/29/2023   History of present illness Patient is a 82 year old male with worsening shortness of breath, chest tightness, CHF. History of ESRD on hemodialysis   OT comments  Chart reviewed prior to tx session. Pt seen for OT treatment on this date. Upon arrival to room pt awake in bed with wife and nurse in room. Tx session targeted improved ADL activity tolerance and independence. Pt was alert and oriented throughout session. Pt MODI for bed mobility with use of bed rails, increased time needed but no physical assistance. Pt utilized RW for lift off only, progressed CGA and no DME during amb to recliner ~38ft. Pt relaxing in recliner at the end of session. Pt making good progress toward goals, will continue to follow POC. Discharge recommendation remains appropriate. OT will follow acutely.           If plan is discharge home, recommend the following:  A little help with walking and/or transfers;A little help with bathing/dressing/bathroom;Help with stairs or ramp for entrance;Assist for transportation;Assistance with cooking/housework   Equipment Recommendations  None recommended by OT    Recommendations for Other Services      Precautions / Restrictions Precautions Precautions: Fall Restrictions Weight Bearing Restrictions: No       Mobility Bed Mobility Overal bed mobility: Modified Independent Bed Mobility: Supine to Sit     Supine to sit: Modified independent (Device/Increase time)     General bed mobility comments: increased time, no physical lifting assistance required    Transfers Overall transfer level: Needs assistance Equipment used: None, Rolling walker (2 wheels) Transfers: Sit to/from Stand Sit to Stand: Contact guard assist           General transfer comment: initally used RW during lift off, progressed CGA and no DME during amb to  recliner ~64ft     Balance Overall balance assessment: Needs assistance Sitting-balance support: Feet supported, No upper extremity supported Sitting balance-Leahy Scale: Good Sitting balance - Comments: Dynamic sitting; crossing midline to reach while on EOB   Standing balance support: No upper extremity supported Standing balance-Leahy Scale: Good Standing balance comment: Short house hold distances                           ADL either performed or assessed with clinical judgement   ADL                           Toilet Transfer: Contact guard assist;Ambulation;Rolling walker (2 wheels) Toilet Transfer Details (indicate cue type and reason): simulated from EOB <> recliner; initally RW but pt stated he doesnt need to use for within room distance. Amb to recliner CGA         Functional mobility during ADLs: Contact guard assist      Extremity/Trunk Assessment              Vision       Perception     Praxis      Cognition Arousal: Alert Behavior During Therapy: WFL for tasks assessed/performed Overall Cognitive Status: Within Functional Limits for tasks assessed                                          Exercises Other Exercises Other  Exercises: Edu: safe ADL completion when d/c, energy conservation techniques    Shoulder Instructions       General Comments      Pertinent Vitals/ Pain       Pain Assessment Pain Assessment: No/denies pain  Home Living                                          Prior Functioning/Environment              Frequency  Min 1X/week        Progress Toward Goals  OT Goals(current goals can now be found in the care plan section)  Progress towards OT goals: Progressing toward goals     Plan      Co-evaluation                 AM-PAC OT "6 Clicks" Daily Activity     Outcome Measure   Help from another person eating meals?: None Help from another  person taking care of personal grooming?: A Little Help from another person toileting, which includes using toliet, bedpan, or urinal?: A Little Help from another person bathing (including washing, rinsing, drying)?: A Little Help from another person to put on and taking off regular upper body clothing?: A Little Help from another person to put on and taking off regular lower body clothing?: A Lot 6 Click Score: 18    End of Session Equipment Utilized During Treatment: Gait belt  OT Visit Diagnosis: Other abnormalities of gait and mobility (R26.89);Muscle weakness (generalized) (M62.81)   Activity Tolerance Patient tolerated treatment well   Patient Left in chair;with chair alarm set;with call bell/phone within reach   Nurse Communication Mobility status        Time: 2956-2130 OT Time Calculation (min): 16 min  Charges: OT General Charges $OT Visit: 1 Visit OT Treatments $Therapeutic Activity: 8-22 mins  [employer], OTS

## 2023-05-29 NOTE — Discharge Summary (Signed)
Physician Discharge Summary   Patient: James Clarke MRN: [id number] DOB: 02/07/1941  Admit date:     05/26/2023  Discharge date: 05/29/23  Discharge Physician: Loyce Dys   PCP: Marina Goodell, MD   Recommendations at discharge:  Follow-up with nephrology as well as cardiology  Discharge Diagnoses: Anginal-like chest pain - suspect volume overload and HTN emergency Elevation of troponin due to demand ischemia  Acute on chronic HFrEF HTN emergency End stage renal disease Nonobstructive CAD IDDM with hyperglycemia Deconditioning Morbid obesity  Hospital Course:  James Clarke is a 82 y.o. male with medical history significant of CKD stage V with recently started HD, chronic HFrEF with LVEF 45% 2023, refractory HTN, nonobstructive CAD, IDDM with diabetic neuropathy, morbid obesity, BPH, presented worsening of shortness of breath and new onset of chest pain.   Patient recently started hemodialysis MWF about 4 weeks ago.  At baseline patient used to have history of poorly controlled hypertension, the initial 2 weeks of dialysis patient experienced much improved blood pressure control.  However started about 2 weeks ago, patient started to have hypotension associated with dialysis and he was symptomatic with lightheadedness at the end of dialysis.  Last week his BP medication were adjusted to address the hypotension associated with HD, metoprolol dosage was cut down to half and amlodipine discontinued but only for dialysis days.  Despite the above-mentioned changes patient started have shortness of breath during the latter half of dialysis and has been recurrent persistently during dialysis for last 3 times.  Last night, patient started to have chest pains pressure-like 6-7/10, centrally located associated with increasing shortness of breath which woke him up around 3 AM.  Took 1 nitroglycerin and aspirin with no significant improvement.  Denied any cough no peripheral swelling.  He  weighed himself every day and found his weight has been Invirase within 1 pound day today.   ED Course: Afebrile, blood pressure 160/80, O2 saturation 97% on room air.  Blood work showed creatinine 3.3 above his baseline glucose 220, WBC 13, hemoglobin 10.9, VBG 7.3 9/49/31.  Chest x-ray showing pulmonary congestion.  Troponin first at 300   EKG showed sinus rhythm, chronic ST changes on multiple leads.  Patient was started on heparin drip and Lasix 80 mg x 1 in the ED.  During admission patient echo showed EF 30 to 35%. Underwent IV diuresis which was later changed to oral. Was seen by cardiologist with GDMT as blood pressure allowed. Also seen by nephrologist and underwent hemodialysis. Patient currently stable and has been cleared by nephrologist and cardiologist for discharge today and to follow-up as an outpatient  Consultants: Cardiology, nephrology  Procedures performed: Hemodialysis Disposition: Home Diet recommendation: Home Cardiac diet DISCHARGE MEDICATION: Allergies as of 05/29/2023       Reactions   Codeine Nausea And Vomiting   Doxycycline    Erythromycin Rash        Medication List     STOP taking these medications    midodrine 10 MG tablet Commonly known as: PROAMATINE   spironolactone 50 MG tablet Commonly known as: ALDACTONE       TAKE these medications    acetaminophen 500 MG tablet Commonly known as: TYLENOL Take 1,000 mg by mouth every 6 (six) hours as needed (shoulder pain).   albuterol 108 (90 Base) MCG/ACT inhaler Commonly known as: VENTOLIN HFA Inhale 2 puffs into the lungs every 6 (six) hours as needed for wheezing or shortness of breath.   aspirin EC  81 MG tablet Take by mouth.   calcitRIOL 0.25 MCG capsule Commonly known as: ROCALTROL Take 0.25 mcg by mouth daily.   cetirizine 10 MG chewable tablet Commonly known as: ZYRTEC Chew 10 mg by mouth daily as needed for allergies.   diazepam 2 MG tablet Commonly known as:  VALIUM Take 2 mg by mouth every 12 (twelve) hours as needed.   fluticasone 50 MCG/ACT nasal spray Commonly known as: FLONASE as needed.   FreeStyle Libre 14 Day Sensor Misc by Does not apply route.   furosemide 40 MG tablet Commonly known as: LASIX Take 2 tablets (80 mg total) by mouth 2 (two) times daily. On non dialysis days, Sunday, Tuesday, Thursday, Saturday and Sunday   Global Inject Ease Insulin Syr 31G X 5/16" 0.5 ML Misc Generic drug: Insulin Syringe-Needle U-100 Inject into the skin.   insulin aspart 100 UNIT/ML injection Commonly known as: novoLOG Inject 14 Units into the skin 3 (three) times daily with meals. What changed: how much to take   insulin detemir 100 UNIT/ML injection Commonly known as: LEVEMIR Inject 0.5 mLs (50 Units total) into the skin at bedtime. What changed: how much to take   isosorbide mononitrate 30 MG 24 hr tablet Commonly known as: IMDUR Take 1 tablet (30 mg total) by mouth daily.   linagliptin 5 MG Tabs tablet Commonly known as: TRADJENTA Take 1 tablet (5 mg total) by mouth daily. Start taking on: May 30, 2023   lovastatin 40 MG tablet Commonly known as: MEVACOR Take 40 mg by mouth daily with supper.   metoprolol succinate 50 MG 24 hr tablet Commonly known as: TOPROL-XL Take 2 tablets (100 mg total) by mouth daily. Do not take on Monday, Wednesday and Friday on dialysis days What changed: how much to take   nitroGLYCERIN 0.4 MG SL tablet Commonly known as: NITROSTAT Place 1 tablet (0.4 mg total) under the tongue every 5 (five) minutes as needed for chest pain.   OneTouch Delica Plus Lancet30G Misc USE 1 LANCET 4 TIMES DAILY   OneTouch Ultra test strip Generic drug: glucose blood TEST BLOOD SUGAR 4 TIMES DAILY   potassium citrate 10 MEQ (1080 MG) SR tablet Commonly known as: UROCIT-K TAKE (1) TABLET BY MOUTH TWICE DAILY WITH A MEAL.   sacubitril-valsartan 24-26 MG Commonly known as: ENTRESTO Take 1 tablet by mouth 2  (two) times daily.   sertraline 50 MG tablet Commonly known as: ZOLOFT Take 50 mg by mouth at bedtime.   tamsulosin 0.4 MG Caps capsule Commonly known as: FLOMAX TAKE (1) CAPSULE BY MOUTH EVERY DAY        Follow-up Information     Gerlene Fee, PA-C. Go in 1 week(s).   Specialty: Cardiology Why: Patient scheduled for Heart Failure clinic with [facility] on 11/12 at 1 PM. Contact information: [address] [place] [address] [phone]                Discharge Exam: Ceasar Mons Weights   05/28/23 1349 05/28/23 1751 05/29/23 0458  Weight: 135.1 kg 134.2 kg 134.7 kg   General exam: awake, alert, no acute distress, obese HEENT: moist mucus membranes, hearing grossly normal  Respiratory system: CTAB diminished bases, no wheezes, rales or rhonchi, normal respiratory effort. Cardiovascular system: normal S1/S2, RRR, unable to visualize JVD, no pedal edema.  Right upper chest HD cath in place Gastrointestinal system: soft, NT, ND Central nervous system: A&O x3. no gross focal neurologic deficits, normal speech Extremities: moves all, no  edema, normal tone Skin: dry, intact, normal temperature Psychiatry: normal mood, congruent affect, judgement and insight appear normal    Condition at discharge: good  The results of significant diagnostics from this hospitalization (including imaging, microbiology, ancillary and laboratory) are listed below for reference.   Imaging Studies: DG Chest 1 View  Result Date: 05/27/2023 CLINICAL DATA:  CHF. EXAM: CHEST  1 VIEW COMPARISON:  05/26/2023 FINDINGS: The cardio pericardial silhouette is enlarged. Stable chronic interstitial changes at the lung bases. Right central line tip overlies the inferior SVC level. No acute bony abnormality. Telemetry leads overlie the chest. IMPRESSION: 1. No acute cardiopulmonary findings. 2. Right central line tip overlies the inferior SVC level. Electronically Signed   By: Kennith Center  M.D.   On: 05/27/2023 07:48   ECHOCARDIOGRAM COMPLETE  Result Date: 05/26/2023    ECHOCARDIOGRAM REPORT   Patient Name:   HIEP GENE Piechota Date of Exam: 05/26/2023 Medical Rec #:  [id number]       Height:       70.0 in Accession #:    [id number]      Weight:       307.8 lb Date of Birth:  1940/10/30      BSA:          2.507 m Patient Age:    81 years        BP:           170/93 mmHg Patient Gender: M               HR:           71 bpm. Exam Location:  [facility] Procedure: 2D Echo, Cardiac Doppler, Color Doppler and Intracardiac            Opacification Agent Indications:     CHF  History:         Patient has prior history of Echocardiogram examinations, most                  recent 12/17/2022. CHF, CAD, Signs/Symptoms:Edema; Risk                  Factors:Hypertension, Diabetes, Dyslipidemia and Sleep Apnea.                  ESRD on Dialysis.  Sonographer:     Mikki Harbor Referring Phys:  [id number] Emeline General Diagnosing Phys: Lorine Bears MD  Sonographer Comments: Technically difficult study due to poor echo windows and patient is obese. IMPRESSIONS  1. Left ventricular ejection fraction, by estimation, is 30 to 35%. The left ventricle has moderately decreased function. Left ventricular endocardial border not optimally defined to evaluate regional wall motion. The left ventricular internal cavity size was mildly dilated. There is moderate left ventricular hypertrophy. Left ventricular diastolic parameters are indeterminate.  2. Right ventricular systolic function is normal. The right ventricular size is normal. Tricuspid regurgitation signal is inadequate for assessing PA pressure.  3. Left atrial size was moderately dilated.  4. Right atrial size was mildly dilated.  5. The mitral valve is normal in structure. Mild mitral valve regurgitation. No evidence of mitral stenosis.  6. The aortic valve is normal in structure. Aortic valve regurgitation is not visualized. Aortic valve sclerosis is present, with no  evidence of aortic valve stenosis. Comparison(s): A prior study was performed on 11/17/22. Changes from prior study are noted. The left ventricular function is significantly worse. FINDINGS  Left Ventricle: Left ventricular ejection fraction, by estimation, is 30  to 35%. The left ventricle has moderately decreased function. Left ventricular endocardial border not optimally defined to evaluate regional wall motion. Definity contrast agent was given IV to delineate the left ventricular endocardial borders. The left ventricular internal cavity size was mildly dilated. There is moderate left ventricular hypertrophy. Left ventricular diastolic parameters are indeterminate. Right Ventricle: The right ventricular size is normal. No increase in right ventricular wall thickness. Right ventricular systolic function is normal. Tricuspid regurgitation signal is inadequate for assessing PA pressure. The tricuspid regurgitant velocity is 2.18 m/s, and with an assumed right atrial pressure of 3 mmHg, the estimated right ventricular systolic pressure is 22.0 mmHg. Left Atrium: Left atrial size was moderately dilated. Right Atrium: Right atrial size was mildly dilated. Pericardium: Trivial pericardial effusion is present. Mitral Valve: The mitral valve is normal in structure. Mild mitral valve regurgitation. No evidence of mitral valve stenosis. MV peak gradient, 3.2 mmHg. The mean mitral valve gradient is 1.0 mmHg. Tricuspid Valve: The tricuspid valve is normal in structure. Tricuspid valve regurgitation is trivial. No evidence of tricuspid stenosis. Aortic Valve: The aortic valve is normal in structure. Aortic valve regurgitation is not visualized. Aortic valve sclerosis is present, with no evidence of aortic valve stenosis. Aortic valve mean gradient measures 3.0 mmHg. Aortic valve peak gradient measures 8.6 mmHg. Aortic valve area, by VTI measures 1.95 cm. Pulmonic Valve: The pulmonic valve was normal in structure. Pulmonic  valve regurgitation is not visualized. No evidence of pulmonic stenosis. Aorta: The aortic root is normal in size and structure. IAS/Shunts: No atrial level shunt detected by color flow Doppler.  LEFT VENTRICLE PLAX 2D LVIDd:         5.70 cm   Diastology LVIDs:         5.20 cm   LV e' medial:    5.22 cm/s LV PW:         1.20 cm   LV E/e' medial:  12.2 LV IVS:        1.40 cm   LV e' lateral:   5.00 cm/s LVOT diam:     2.10 cm   LV E/e' lateral: 12.7 LV SV:         67 LV SV Index:   27 LVOT Area:     3.46 cm  RIGHT VENTRICLE RV Basal diam:  4.50 cm RV Mid diam:    4.60 cm RV S prime:     16.80 cm/s LEFT ATRIUM              Index        RIGHT ATRIUM           Index LA diam:        4.10 cm  1.64 cm/m   RA Area:     23.70 cm LA Vol (A2C):   117.0 ml 46.67 ml/m  RA Volume:   74.20 ml  29.60 ml/m LA Vol (A4C):   86.7 ml  34.58 ml/m LA Biplane Vol: 110.0 ml 43.88 ml/m  AORTIC VALVE                    PULMONIC VALVE AV Area (Vmax):    2.40 cm     PV Vmax:       1.06 m/s AV Area (Vmean):   2.78 cm     PV Peak grad:  4.5 mmHg AV Area (VTI):     1.95 cm AV Vmax:           147.00 cm/s  AV Vmean:          80.600 cm/s AV VTI:            0.342 m AV Peak Grad:      8.6 mmHg AV Mean Grad:      3.0 mmHg LVOT Vmax:         102.00 cm/s LVOT Vmean:        64.800 cm/s LVOT VTI:          0.193 m LVOT/AV VTI ratio: 0.56  AORTA Ao Root diam: 3.80 cm MITRAL VALVE               TRICUSPID VALVE MV Area (PHT): 2.91 cm    TR Peak grad:   19.0 mmHg MV Area VTI:   2.65 cm    TR Vmax:        218.00 cm/s MV Peak grad:  3.2 mmHg MV Mean grad:  1.0 mmHg    SHUNTS MV Vmax:       0.89 m/s    Systemic VTI:  0.19 m MV Vmean:      49.9 cm/s   Systemic Diam: 2.10 cm MV Decel Time: 261 msec MV E velocity: 63.70 cm/s MV A velocity: 44.00 cm/s MV E/A ratio:  1.45 Lorine Bears MD Electronically signed by Lorine Bears MD Signature Date/Time: 05/26/2023/5:43:57 PM    Final    DG Chest Port 1 View  Result Date: 05/26/2023 CLINICAL DATA:  Chest  pain.  Shortness of breath. EXAM: PORTABLE CHEST 1 VIEW COMPARISON:  12/13/2022. FINDINGS: Low lung volume. There is stable mild increased bronchovascular markings without frank pulmonary edema. There are probable atelectatic changes at the lung bases. Bilateral lung fields are otherwise clear. There is blunting of bilateral costophrenic angles, which may represent small bilateral pleural effusions. Stable moderately enlarged cardio-mediastinal silhouette. No acute osseous abnormalities. The soft tissues are within normal limits. There is right IJ hemodialysis catheter with its tip overlying the cavoatrial junction region. IMPRESSION: *Low lung volume. No frank pulmonary edema. Probable bilateral pleural effusions and atelectatic changes at the lung bases. Right IJ hemodialysis catheter noted. Electronically Signed   By: Jules Schick M.D.   On: 05/26/2023 08:54    Microbiology: Results for orders placed or performed during the hospital encounter of 05/26/23  MRSA Next Gen by PCR, Nasal     Status: Abnormal   Collection Time: 05/26/23  1:35 PM   Specimen: Nasal Mucosa; Nasal Swab  Result Value Ref Range Status   MRSA by PCR Next Gen DETECTED (A) NOT DETECTED Final    Comment: RESULT CALLED TO, READ BACK BY AND VERIFIED WITH:  PRAWEENA BHUSAL 05/26/2023 1748 CP (NOTE) The GeneXpert MRSA Assay (FDA approved for NASAL specimens only), is one component of a comprehensive MRSA colonization surveillance program. It is not intended to diagnose MRSA infection nor to guide or monitor treatment for MRSA infections. Test performance is not FDA approved in patients less than 36 years old. Performed at [facility], [address] [place], [place] [address]     Labs: CBC: Recent Labs  Lab 05/26/23 0749 05/27/23 0416 05/29/23 0452  WBC 13.1* 10.9* 11.7*  NEUTROABS 10.3*  --  7.7  HGB 10.9* 10.4* 11.7*  HCT 33.4* 32.2* 36.2*  MCV 91.8 93.1 90.5  PLT 294 288 297   Basic Metabolic  Panel: Recent Labs  Lab 05/26/23 0749 05/27/23 0416 05/28/23 0407 05/29/23 0333  NA 137 139 136 132*  K 3.8 3.6 3.5 3.7  CL 100 101 99 98  CO2 25 27 28 25   GLUCOSE 220* 154* 219* 200*  BUN 47* 53* 41* 34*  CREATININE 3.35* 3.66* 2.74* 2.52*  CALCIUM 8.3* 8.3* 7.9* 8.1*   Liver Function Tests: Recent Labs  Lab 05/26/23 0749  AST 20  ALT 18  ALKPHOS 74  BILITOT 0.5  PROT 6.7  ALBUMIN 2.9*   CBG: Recent Labs  Lab 05/28/23 1159 05/28/23 1816 05/28/23 2110 05/29/23 0809 05/29/23 1207  GLUCAP 268* 135* 169* 211* 357*    Discharge time spent:  38 minutes.  Signed: Loyce Dys, MD [facility] 05/29/2023

## 2023-05-29 NOTE — Progress Notes (Signed)
Patient is ready for discharge. I have reviewed all discharge instructions with patient and his wife.I have answered all questions regarding discharge. Patient's IV's have been removed without complication. He has been taken off telemetry. Patient has all of his belongings with him. He will be taken home by his wife who is with him.

## 2023-05-29 NOTE — TOC Transition Note (Signed)
Transition of Care [facility]) - CM/SW Discharge Note   Patient Details  Name: James Clarke MRN: [id number] Date of Birth: 1940-12-03  [facility] of [facility]) CM/SW Contact:  Margarito Liner, LCSW Phone Number: 05/29/2023, 11:06 AM   Clinical Narrative:  Patient has orders to discharge home today. No further concerns. CSW signing off.   Final next level of care: Home/Self Care Barriers to Discharge: Barriers Resolved   Patient Goals and CMS Choice      Discharge Placement                  Patient to be transferred to facility by: Wife   Patient and family notified of of transfer: 05/29/23  Discharge Plan and Services Additional resources added to the After Visit Summary for       Post Acute Care Choice: NA                               Social Determinants of Health (SDOH) Interventions SDOH Screenings   Food Insecurity: No Food Insecurity (05/26/2023)  Housing: Patient Unable To Answer (05/26/2023)  Transportation Needs: No Transportation Needs (05/26/2023)  Utilities: Not At Risk (05/26/2023)  Depression (PHQ2-9): Low Risk  (10/30/2021)  Financial Resource Strain: Low Risk  (12/20/2022)   Received from [facility], [facility]  Tobacco Use: Low Risk  (05/26/2023)     Readmission Risk Interventions    05/28/2023   12:24 PM 02/27/2022    4:22 PM  Readmission Risk Prevention Plan  Transportation Screening Complete Complete  PCP or Specialist Appt within 3-5 Days Complete   HRI or Home Care Consult  Complete  Social Work Consult for Recovery Care Planning/Counseling Complete   Palliative Care Screening Not Applicable Not Applicable  Medication Review Oceanographer) Complete Complete

## 2023-05-29 NOTE — Care Management Important Message (Signed)
Important Message  Patient Details  Name: James Clarke MRN: [id number] Date of Birth: 30-Sep-1940   Important Message Given:  Yes - Medicare IM     Olegario Messier A Mariadelaluz [place] 05/29/2023, 12:06 PM

## 2023-05-29 NOTE — Progress Notes (Signed)
[facility]  ROUNDING NOTE   Subjective:   James Clarke is a 82 year old male well-known to our practice.  Medical history consistent with hypertension, CHF, nonobstructive CAD, IDDM with diabetic nephropathy, obesity, BPH, and end-stage renal disease on hemodialysis.  Patient presents to the emergency room from home complaining of chest pain and shortness of breath and has been admitted for CHF (congestive heart failure) (HCC) [I50.9] NSTEMI (non-ST elevated myocardial infarction) (HCC) [I21.4] Acute on chronic congestive heart failure, unspecified heart failure type [facility]) [I50.9]  Patient is known to our practice and receives outpatient dialysis treatments at [facility] on a MWF schedule, supervised by Dr. Cherylann Ratel.    Patient currently resting in bed, wife at bedside Voices frustrations about restrictive diet due to cardiac and renal restrictions. Remains on room air Lower extremity edema improved   Objective:  Vital signs in last 24 hours:  Temp:  [97.3 F (36.3 C)-98.2 F (36.8 C)] 98.1 F (36.7 C) (11/07 1211) Pulse Rate:  [51-73] 73 (11/07 1211) Resp:  [18-20] 20 (11/07 1211) BP: (114-137)/(60-85) 114/60 (11/07 1211) SpO2:  [92 %-97 %] 96 % (11/07 1211) FiO2 (%):  [21 %] 21 % (11/07 0000) Weight:  [134.2 kg-134.7 kg] 134.7 kg (11/07 0458)  Weight change: -1.3 kg Filed Weights   05/28/23 1349 05/28/23 1751 05/29/23 0458  Weight: 135.1 kg 134.2 kg 134.7 kg    Intake/Output: I/O last 3 completed shifts: In: 180 [P.O.:180] Out: 2600 [Urine:1600; Other:1000]   Intake/Output this shift:  Total I/O In: -  Out: 250 [Urine:250]  Physical Exam: General: NAD  Head: Normocephalic, atraumatic. Moist oral mucosal membranes  Eyes: Anicteric  Lungs:  Clear to auscultation, normal effort, room air  Heart: Regular rate and rhythm  Abdomen:  Soft, nontender, obese  Extremities: No peripheral edema.  Neurologic: Alert and oriented, moving all four extremities   Skin: No lesions  Access: Right chest PermCath    Basic Metabolic Panel: Recent Labs  Lab 05/26/23 0749 05/27/23 0416 05/28/23 0407 05/29/23 0333  NA 137 139 136 132*  K 3.8 3.6 3.5 3.7  CL 100 101 99 98  CO2 25 27 28 25   GLUCOSE 220* 154* 219* 200*  BUN 47* 53* 41* 34*  CREATININE 3.35* 3.66* 2.74* 2.52*  CALCIUM 8.3* 8.3* 7.9* 8.1*    Liver Function Tests: Recent Labs  Lab 05/26/23 0749  AST 20  ALT 18  ALKPHOS 74  BILITOT 0.5  PROT 6.7  ALBUMIN 2.9*   Recent Labs  Lab 05/26/23 0749  LIPASE 41   No results for input(s): "AMMONIA" in the last 168 hours.  CBC: Recent Labs  Lab 05/26/23 0749 05/27/23 0416 05/29/23 0452  WBC 13.1* 10.9* 11.7*  NEUTROABS 10.3*  --  7.7  HGB 10.9* 10.4* 11.7*  HCT 33.4* 32.2* 36.2*  MCV 91.8 93.1 90.5  PLT 294 288 297    Cardiac Enzymes: No results for input(s): "CKTOTAL", "CKMB", "CKMBINDEX", "TROPONINI" in the last 168 hours.  BNP: Invalid input(s): "POCBNP"  CBG: Recent Labs  Lab 05/28/23 1159 05/28/23 1816 05/28/23 2110 05/29/23 0809 05/29/23 1207  GLUCAP 268* 135* 169* 211* 357*    Microbiology: Results for orders placed or performed during the hospital encounter of 05/26/23  MRSA Next Gen by PCR, Nasal     Status: Abnormal   Collection Time: 05/26/23  1:35 PM   Specimen: Nasal Mucosa; Nasal Swab  Result Value Ref Range Status   MRSA by PCR Next Gen DETECTED (A) NOT DETECTED Final  Comment: RESULT CALLED TO, READ BACK BY AND VERIFIED WITH:  PRAWEENA BHUSAL 05/26/2023 1748 CP (NOTE) The GeneXpert MRSA Assay (FDA approved for NASAL specimens only), is one component of a comprehensive MRSA colonization surveillance program. It is not intended to diagnose MRSA infection nor to guide or monitor treatment for MRSA infections. Test performance is not FDA approved in patients less than 74 years old. Performed at [facility], [address] [place], [place] [address]     Coagulation  Studies: No results for input(s): "LABPROT", "INR" in the last 72 hours.   Urinalysis: No results for input(s): "COLORURINE", "LABSPEC", "PHURINE", "GLUCOSEU", "HGBUR", "BILIRUBINUR", "KETONESUR", "PROTEINUR", "UROBILINOGEN", "NITRITE", "LEUKOCYTESUR" in the last 72 hours.  Invalid input(s): "APPERANCEUR"    Imaging: No results found.   Medications:      aspirin EC  81 mg Oral Daily   Chlorhexidine Gluconate Cloth  6 each Topical Q0600   fluticasone  1 spray Each Nare Daily   furosemide  80 mg Oral BID   insulin aspart  0-9 Units Subcutaneous TID WC   insulin aspart  14 Units Subcutaneous TID WC   insulin detemir  42 Units Subcutaneous QHS   isosorbide mononitrate  30 mg Oral Daily   linagliptin  5 mg Oral Daily   loratadine  10 mg Oral Daily   metoprolol succinate  100 mg Oral Daily   potassium citrate  20 mEq Oral BID AC   pravastatin  40 mg Oral q1800   sacubitril-valsartan  1 tablet Oral BID   sertraline  50 mg Oral Daily   sodium chloride flush  3 mL Intravenous Q12H   tamsulosin  0.4 mg Oral QPC supper   acetaminophen, albuterol, diazepam, hydrALAZINE, nitroGLYCERIN, ondansetron (ZOFRAN) IV, sodium chloride flush  Assessment/ Plan:  [name] is a 82 y.o.  male well-known to our practice.  Medical history consistent with hypertension, CHF, nonobstructive CAD, IDDM with diabetic nephropathy, obesity, BPH, and end-stage renal disease on hemodialysis.  Patient presents to the emergency room from home complaining of chest pain and shortness of breath and has been admitted for CHF (congestive heart failure) (HCC) [I50.9] NSTEMI (non-ST elevated myocardial infarction) (HCC) [I21.4] Acute on chronic congestive heart failure, unspecified heart failure type (HCC) [I50.9]  [facility]/MWF/right chest PermCath  End-stage renal disease on hemodialysis.  Patient received dialysis yesterday to maintain outpatient schedule.  UF 1 L achieved.  Next treatment  scheduled for Friday.  2.  Chest pain with elevated troponin.  Cardiology consulted, feels this is likely demand ischemia.  Echo shows EF 30-35%.  Cardiology forming medication changes.  Patient began Sherryll Burger during this admission and increased dose of metoprolol.  Will continue Imdur unless hypotensive.  Will continue to follow patient outpatient.  3. Anemia of chronic kidney disease Lab Results  Component Value Date   HGB 11.7 (L) 05/29/2023    Patient receives Mircera at outpatient clinic.    4. Secondary Hyperparathyroidism: with outpatient labs: PTH 310, phosphorus 4.6, calcium 8.7 on 04/11/23.   Lab Results  Component Value Date   CALCIUM 8.1 (L) 05/29/2023   CAION 1.08 (L) 09/19/2022   PHOS 4.4 12/13/2022    Will continue to monitor bone minerals.  5.  Hypertension with chronic kidney disease Home regimen includes isosorbide, amlodipine, metoprolol, spironolactone, and torsemide.  Spironolactone and torsemide held.  Remains on Entresto, furosemide, isosorbide and metoprolol.    LOS: 3 Axzel [place] 11/7/20244:39 PM

## 2023-05-31 DIAGNOSIS — I214 Non-ST elevation (NSTEMI) myocardial infarction: Secondary | ICD-10-CM | POA: Diagnosis not present

## 2023-06-03 ENCOUNTER — Encounter (INDEPENDENT_AMBULATORY_CARE_PROVIDER_SITE_OTHER): Payer: Self-pay | Admitting: Vascular Surgery

## 2023-06-03 ENCOUNTER — Ambulatory Visit (INDEPENDENT_AMBULATORY_CARE_PROVIDER_SITE_OTHER): Payer: PPO | Admitting: Vascular Surgery

## 2023-06-03 ENCOUNTER — Other Ambulatory Visit: Payer: PPO

## 2023-06-03 VITALS — BP 155/79 | HR 66 | Resp 16 | Wt 302.6 lb

## 2023-06-03 DIAGNOSIS — N186 End stage renal disease: Secondary | ICD-10-CM

## 2023-06-03 DIAGNOSIS — E1122 Type 2 diabetes mellitus with diabetic chronic kidney disease: Secondary | ICD-10-CM

## 2023-06-03 DIAGNOSIS — E785 Hyperlipidemia, unspecified: Secondary | ICD-10-CM | POA: Diagnosis not present

## 2023-06-03 DIAGNOSIS — Z992 Dependence on renal dialysis: Secondary | ICD-10-CM | POA: Diagnosis not present

## 2023-06-03 DIAGNOSIS — Z794 Long term (current) use of insulin: Secondary | ICD-10-CM | POA: Diagnosis not present

## 2023-06-03 DIAGNOSIS — I5032 Chronic diastolic (congestive) heart failure: Secondary | ICD-10-CM | POA: Diagnosis not present

## 2023-06-03 DIAGNOSIS — M7989 Other specified soft tissue disorders: Secondary | ICD-10-CM

## 2023-06-03 NOTE — Progress Notes (Signed)
MRN : [id number]  James Clarke is a 82 y.o. (16-Jul-1941) male who presents with chief complaint of  Chief Complaint  Patient presents with   Follow-up    4-6 week follow up  .  History of Present Illness: Patient returns today in follow up of his dialysis access.  He has been having a lot of issues with hypotension that have persisted.  He was in the hospital for 4 days last week.  He had a reduced ejection fraction on echocardiogram.  He scheduled for a stress test later this week.  His PermCath is working well.  We are planning a left brachiocephalic AV fistula once his medical issues are stabilized.  Current Outpatient Medications  Medication Sig Dispense Refill   acetaminophen (TYLENOL) 500 MG tablet Take 1,000 mg by mouth every 6 (six) hours as needed (shoulder pain).     albuterol (PROVENTIL HFA;VENTOLIN HFA) 108 (90 Base) MCG/ACT inhaler Inhale 2 puffs into the lungs every 6 (six) hours as needed for wheezing or shortness of breath.     aspirin EC 81 MG tablet Take by mouth.     calcitRIOL (ROCALTROL) 0.25 MCG capsule Take 0.25 mcg by mouth daily.     cetirizine (ZYRTEC) 10 MG chewable tablet Chew 10 mg by mouth daily as needed for allergies.     Continuous Blood Gluc Sensor (FREESTYLE LIBRE 14 DAY SENSOR) MISC by Does not apply route.     diazepam (VALIUM) 2 MG tablet Take 2 mg by mouth every 12 (twelve) hours as needed.     fluticasone (FLONASE) 50 MCG/ACT nasal spray as needed.     furosemide (LASIX) 40 MG tablet Take 2 tablets (80 mg total) by mouth 2 (two) times daily. On non dialysis days, Sunday, Tuesday, Thursday, Saturday and Sunday 120 tablet 0   GLOBAL INJECT EASE INSULIN SYR 31G X 5/16" 0.5 ML MISC Inject into the skin.     glucose blood (ONETOUCH ULTRA) test strip TEST BLOOD SUGAR 4 TIMES DAILY     insulin aspart (NOVOLOG) 100 UNIT/ML injection Inject 14 Units into the skin 3 (three) times daily with meals. (Patient taking differently: Inject 35 Units into the skin  3 (three) times daily with meals.) 10 mL 11   insulin detemir (LEVEMIR) 100 UNIT/ML injection Inject 0.5 mLs (50 Units total) into the skin at bedtime. (Patient taking differently: Inject 35 Units into the skin at bedtime.) 10 mL 1   isosorbide mononitrate (IMDUR) 30 MG 24 hr tablet Take 1 tablet (30 mg total) by mouth daily. 30 tablet 2   Lancets (ONETOUCH DELICA PLUS LANCET30G) MISC USE 1 LANCET 4 TIMES DAILY     linagliptin (TRADJENTA) 5 MG TABS tablet Take 1 tablet (5 mg total) by mouth daily. 30 tablet 0   lovastatin (MEVACOR) 40 MG tablet Take 40 mg by mouth daily with supper.     metoprolol succinate (TOPROL-XL) 50 MG 24 hr tablet Take 2 tablets (100 mg total) by mouth daily. Do not take on Monday, Wednesday and Friday on dialysis days 120 tablet 0   nitroGLYCERIN (NITROSTAT) 0.4 MG SL tablet Place 1 tablet (0.4 mg total) under the tongue every 5 (five) minutes as needed for chest pain. 20 tablet 1   potassium citrate (UROCIT-K) 10 MEQ (1080 MG) SR tablet TAKE (1) TABLET BY MOUTH TWICE DAILY WITH A MEAL. 60 tablet 11   sacubitril-valsartan (ENTRESTO) 24-26 MG Take 1 tablet by mouth 2 (two) times daily. 60 tablet 2  sertraline (ZOLOFT) 50 MG tablet Take 50 mg by mouth at bedtime.     tamsulosin (FLOMAX) 0.4 MG CAPS capsule TAKE (1) CAPSULE BY MOUTH EVERY DAY 30 capsule 1   No current facility-administered medications for this visit.    Past Medical History:  Diagnosis Date   (HFpEF) heart failure with preserved ejection fraction (HCC)    a.) TTE 11/26/2021: EF 45%, mod LVH, post HK, mod LAE, mild MR/TR, G2DD; b.) TTE 02/26/2022: EF 55-60%, BAE, triv MR, G2DD; c.) TTE 07/09/2022: EF 45%, post HK, LVH, mod LAE, triv TR/PR, mild MR, G1DD   Anemia of chronic renal failure    Aortic atherosclerosis (HCC)    Asthma    Bell's palsy    BPH (benign prostatic hyperplasia)    CAD (coronary artery disease)    a.) LHC 12/31/2021: 60% mLAD, 20% pRCA, 20% dRCA, 20% o-pLCx --> med mgmt.   CKD  (chronic kidney disease), stage IV (HCC)    Diabetic neuropathy (HCC)    Dyspnea on exertion    Ganglion cyst of wrist, right    a.) s/p excision 04/2011   Hepatic steatosis    History of 2019 novel coronavirus disease (COVID-19)    History of bilateral cataract extraction 2018   Hyperlipidemia    Hyperparathyroidism due to renal insufficiency (HCC)    Hypertension    Low testosterone in male    Nephrolithiasis    Obesity    OSA treated with BiPAP    Osteoarthritis    Peripheral edema    Right inguinal hernia    a.) s/p repair   Skin cancer    Type 2 diabetes mellitus with renal manifestations [facility])     Past Surgical History:  Procedure Laterality Date   A/V FISTULAGRAM Left 01/13/2023   Procedure: A/V Fistulagram;  Surgeon: Annice Needy, MD;  Location: [facility];  Service: Cardiovascular;  Laterality: Left;   A/V SHUNT INTERVENTION Left 04/11/2023   Procedure: A/V SHUNT INTERVENTION;  Surgeon: Annice Needy, MD;  Location: [facility];  Service: Cardiovascular;  Laterality: Left;   APPENDECTOMY     AV FISTULA PLACEMENT Left 09/19/2022   Procedure: ARTERIOVENOUS (AV) FISTULA CREATION (RADIO CEPHALIC);  Surgeon: Annice Needy, MD;  Location: [facility];  Service: Vascular;  Laterality: Left;   CATARACT EXTRACTION W/PHACO Left 05/20/2017   Procedure: CATARACT EXTRACTION PHACO AND INTRAOCULAR LENS PLACEMENT (IOC);  Surgeon: Galen Manila, MD;  Location: [facility];  Service: Ophthalmology;  Laterality: Left;  [place] 00:32.0 AP% 15.5 CDE 4.97 Fluid Pack Lot # [id number]   CATARACT EXTRACTION W/PHACO Right 06/10/2017   Procedure: CATARACT EXTRACTION PHACO AND INTRAOCULAR LENS PLACEMENT (IOC);  Surgeon: Galen Manila, MD;  Location: [facility];  Service: Ophthalmology;  Laterality: Right;  [place]  00:51 AP% 15.4 CDE 7.95 Fluid pack lot # [id number]   CHOLECYSTECTOMY     COLONOSCOPY     x3   CYSTOSCOPY W/ RETROGRADES Right 08/13/2019   Procedure: CYSTOSCOPY WITH RETROGRADE  PYELOGRAM;  Surgeon: Sondra Come, MD;  Location: [facility];  Service: Urology;  Laterality: Right;   CYSTOSCOPY WITH STENT PLACEMENT Right 07/26/2019   Procedure: CYSTOSCOPY WITH STENT PLACEMENT;  Surgeon: Crista Elliot, MD;  Location: [facility];  Service: Urology;  Laterality: Right;   CYSTOSCOPY/URETEROSCOPY/HOLMIUM LASER/STENT PLACEMENT Right 08/13/2019   Procedure: CYSTOSCOPY/URETEROSCOPY/HOLMIUM LASER/STENT EXCHANGE;  Surgeon: Sondra Come, MD;  Location: [facility];  Service: Urology;  Laterality: Right;   CYSTOSCOPY/URETEROSCOPY/HOLMIUM LASER/STENT PLACEMENT Left 02/27/2022  Procedure: CYSTOSCOPY/URETEROSCOPY/HOLMIUM LASER/STENT PLACEMENT;  Surgeon: Riki Altes, MD;  Location: [facility];  Service: Urology;  Laterality: Left;   DIALYSIS/PERMA CATHETER INSERTION N/A 04/11/2023   Procedure: DIALYSIS/PERMA CATHETER INSERTION;  Surgeon: Annice Needy, MD;  Location: [facility];  Service: Cardiovascular;  Laterality: N/A;   GANGLION CYST EXCISION Right    INGUINAL HERNIA REPAIR Right    LEFT HEART CATH AND CORONARY ANGIOGRAPHY N/A 12/31/2021   Procedure: LEFT HEART CATH AND CORONARY ANGIOGRAPHY;  Surgeon: Lamar Blinks, MD;  Location: [facility];  Service: Cardiovascular;  Laterality: N/A;   SHOULDER ARTHROSCOPY WITH SUBACROMIAL DECOMPRESSION AND OPEN ROTATOR C Right 09/11/2020   Procedure: Right shoulder arthroscopic rotator cuff repair vs Regeneten patch application, subacromial decompression, and biceps tenodesis - Dedra Skeens to Assist;  Surgeon: Signa Kell, MD;  Location: [facility];  Service: Orthopedics;  Laterality: Right;   TEMPORARY DIALYSIS CATHETER N/A 03/05/2022   Procedure: TEMPORARY DIALYSIS CATHETER;  Surgeon: Renford Dills, MD;  Location: [facility];  Service: Cardiovascular;  Laterality: N/A;   TONSILLECTOMY     URETEROSCOPY WITH HOLMIUM LASER LITHOTRIPSY Left 02/27/2022   Procedure: URETEROSCOPY WITH HOLMIUM LASER LITHOTRIPSY;   Surgeon: Riki Altes, MD;  Location: [facility];  Service: Urology;  Laterality: Left;   WRIST FRACTURE SURGERY Right      Social History   Tobacco Use   Smoking status: Never    Passive exposure: Never   Smokeless tobacco: Never  Vaping Use   Vaping status: Never Used  Substance Use Topics   Alcohol use: No   Drug use: Not Currently      Family History  Problem Relation Age of Onset   Emphysema Mother    COPD Mother    Heart disease Mother    Brain cancer Father      Allergies  Allergen Reactions   Codeine Nausea And Vomiting   Doxycycline    Erythromycin Rash    REVIEW OF SYSTEMS (Negative unless checked)   Constitutional: [] Weight loss  [] Fever  [] Chills Cardiac: [] Chest pain   [] Chest pressure   [] Palpitations   [] Shortness of breath when laying flat   [x] Shortness of breath at rest   [x] Shortness of breath with exertion. Vascular:  [] Pain in legs with walking   [] Pain in legs at rest   [] Pain in legs when laying flat   [] Claudication   [] Pain in feet when walking  [] Pain in feet at rest  [] Pain in feet when laying flat   [] History of DVT   [] Phlebitis   [] Swelling in legs   [] Varicose veins   [] Non-healing ulcers Pulmonary:   [] Uses home oxygen   [] Productive cough   [] Hemoptysis   [] Wheeze  [] COPD   [] Asthma Neurologic:  [x] Dizziness  [x] Blackouts   [] Seizures   [] History of stroke   [] History of TIA  [] Aphasia   [] Temporary blindness   [] Dysphagia   [] Weakness or numbness in arms   [] Weakness or numbness in legs Musculoskeletal:  [] Arthritis   [] Joint swelling   [x] Joint pain   [] Low back pain Hematologic:  [] Easy bruising  [] Easy bleeding   [] Hypercoagulable state   [x] Anemic   Gastrointestinal:  [] Blood in stool   [] Vomiting blood  [] Gastroesophageal reflux/heartburn   [] Abdominal pain Genitourinary:  [x] Chronic kidney disease   [] Difficult urination  [] Frequent urination  [] Burning with urination   [] Hematuria Skin:  [] Rashes   [] Ulcers    [] Wounds Psychological:  [] History of anxiety   []   History of major depression.  Physical Examination  BP (!) 155/79 (BP Location: Right Arm)   Pulse 66   Resp 16   Wt (!) 302 lb 9.6 oz (137.3 kg)   BMI 43.42 kg/m  Gen:  WD/WN, NAD. Obese  Head: [place]/AT, No temporalis wasting. Ear/Nose/Throat: Hearing grossly intact, nares w/o erythema or drainage Eyes: Conjunctiva clear. Sclera non-icteric Neck: Supple.  Trachea midline Pulmonary:  Good air movement, no use of accessory muscles.  Cardiac: RRR, no JVD Vascular: no thrill in left radiocephalic AVF. Cephalic vein at the [facility] fossa visible.  Vessel Right Left  Radial Palpable Palpable               Musculoskeletal: M/S 5/5 throughout.  No deformity or atrophy. Mild LE edema. Neurologic: Sensation grossly intact in extremities.  Symmetrical.  Speech is fluent.  Psychiatric: Judgment intact, Mood & affect appropriate for pt's clinical situation. Dermatologic: No rashes or ulcers noted.  No cellulitis or open wounds.      Labs Recent Results (from the past 2160 hour(s))  Potassium [facility] vascular lab only)     Status: None   Collection Time: 04/11/23  8:16 AM  Result Value Ref Range   Potassium [facility] vascular lab) 3.9 3.5 - 5.1 mmol/L    Comment: Performed at [facility], [address] [place], [place] [address]  Glucose, capillary     Status: Abnormal   Collection Time: 04/11/23  8:20 AM  Result Value Ref Range   Glucose-Capillary 261 (H) 70 - 99 mg/dL    Comment: Glucose reference range applies only to samples taken after fasting for at least 8 hours.  Glucose, capillary     Status: Abnormal   Collection Time: 04/11/23 10:25 AM  Result Value Ref Range   Glucose-Capillary 215 (H) 70 - 99 mg/dL    Comment: Glucose reference range applies only to samples taken after fasting for at least 8 hours.  Comprehensive metabolic panel     Status: Abnormal   Collection Time: 04/21/23  5:27 PM  Result Value Ref Range    Sodium 137 135 - 145 mmol/L   Potassium 3.5 3.5 - 5.1 mmol/L   Chloride 98 98 - 111 mmol/L   CO2 25 22 - 32 mmol/L   Glucose, Bld 186 (H) 70 - 99 mg/dL    Comment: Glucose reference range applies only to samples taken after fasting for at least 8 hours.   BUN 21 8 - 23 mg/dL   Creatinine, Ser 6.44 (H) 0.61 - 1.24 mg/dL   Calcium 8.5 (L) 8.9 - 10.3 mg/dL   Total Protein 7.7 6.5 - 8.1 g/dL   Albumin 3.1 (L) 3.5 - 5.0 g/dL   AST 29 15 - 41 U/L   ALT 16 0 - 44 U/L   Alkaline Phosphatase 76 38 - 126 U/L   Total Bilirubin 0.2 (L) 0.3 - 1.2 mg/dL   GFR, Estimated 34 (L) >60 mL/min    Comment: (NOTE) Calculated using the CKD-EPI Creatinine Equation (2021)    Anion gap 14 5 - 15    Comment: Performed at [facility], [address] [place], [place] [address]  CBC with Differential     Status: Abnormal   Collection Time: 04/21/23  5:27 PM  Result Value Ref Range   WBC 12.8 (H) 4.0 - 10.5 K/uL   RBC 3.88 (L) 4.22 - 5.81 MIL/uL   Hemoglobin 11.2 (L) 13.0 - 17.0 g/dL   HCT 25.9 (L) 56.3 - 87.5 %  MCV 89.2 80.0 - 100.0 fL   MCH 28.9 26.0 - 34.0 pg   MCHC 32.4 30.0 - 36.0 g/dL   RDW 82.9 56.2 - 13.0 %   Platelets 329 150 - 400 K/uL   nRBC 0.0 0.0 - 0.2 %   Neutrophils Relative % 75 %   Neutro Abs 9.6 (H) 1.7 - 7.7 K/uL   Lymphocytes Relative 13 %   Lymphs Abs 1.7 0.7 - 4.0 K/uL   Monocytes Relative 7 %   Monocytes Absolute 1.0 0.1 - 1.0 K/uL   Eosinophils Relative 3 %   Eosinophils Absolute 0.4 0.0 - 0.5 K/uL   Basophils Relative 1 %   Basophils Absolute 0.1 0.0 - 0.1 K/uL   Immature Granulocytes 1 %   Abs Immature Granulocytes 0.11 (H) 0.00 - 0.07 K/uL    Comment: Performed at [facility], [address], [place], [place] [address]  Magnesium     Status: None   Collection Time: 04/21/23  5:27 PM  Result Value Ref Range   Magnesium 1.9 1.7 - 2.4 mg/dL    Comment: Performed at [facility], [address] [place], [place] [address]  CBG  monitoring, ED     Status: Abnormal   Collection Time: 04/21/23  5:29 PM  Result Value Ref Range   Glucose-Capillary 201 (H) 70 - 99 mg/dL    Comment: Glucose reference range applies only to samples taken after fasting for at least 8 hours.  Brain natriuretic peptide     Status: Abnormal   Collection Time: 05/26/23  7:49 AM  Result Value Ref Range   B Natriuretic Peptide 1,047.2 (H) 0.0 - 100.0 pg/mL    Comment: Performed at [facility], [address] [place], [place] [address]  Comprehensive metabolic panel     Status: Abnormal   Collection Time: 05/26/23  7:49 AM  Result Value Ref Range   Sodium 137 135 - 145 mmol/L   Potassium 3.8 3.5 - 5.1 mmol/L   Chloride 100 98 - 111 mmol/L   CO2 25 22 - 32 mmol/L   Glucose, Bld 220 (H) 70 - 99 mg/dL    Comment: Glucose reference range applies only to samples taken after fasting for at least 8 hours.   BUN 47 (H) 8 - 23 mg/dL   Creatinine, Ser 1.32 (H) 0.61 - 1.24 mg/dL   Calcium 8.3 (L) 8.9 - 10.3 mg/dL   Total Protein 6.7 6.5 - 8.1 g/dL   Albumin 2.9 (L) 3.5 - 5.0 g/dL   AST 20 15 - 41 U/L   ALT 18 0 - 44 U/L   Alkaline Phosphatase 74 38 - 126 U/L   Total Bilirubin 0.5 <1.2 mg/dL   GFR, Estimated 18 (L) >60 mL/min    Comment: (NOTE) Calculated using the CKD-EPI Creatinine Equation (2021)    Anion gap 12 5 - 15    Comment: Performed at [facility], [address] [place], [place] [address]  Lipase, blood     Status: None   Collection Time: 05/26/23  7:49 AM  Result Value Ref Range   Lipase 41 11 - 51 U/L    Comment: Performed at [facility], [address] [place], [place] [address]  Troponin I (High Sensitivity)     Status: Abnormal   Collection Time: 05/26/23  7:49 AM  Result Value Ref Range   Troponin I (High Sensitivity) 316 (HH) <18 ng/L    Comment: CRITICAL RESULT CALLED TO, READ BACK  BY AND VERIFIED WITH BILLIE MONTEE 05/26/23 0843 MW (NOTE) Elevated high sensitivity troponin I (hsTnI)  values and significant  changes across serial measurements may suggest ACS but many other  chronic and acute conditions are known to elevate hsTnI results.  Refer to the "Links" section for chest pain algorithms and additional  guidance. Performed at [facility], [address] [place], [place] [address]   CBC with Differential     Status: Abnormal   Collection Time: 05/26/23  7:49 AM  Result Value Ref Range   WBC 13.1 (H) 4.0 - 10.5 K/uL   RBC 3.64 (L) 4.22 - 5.81 MIL/uL   Hemoglobin 10.9 (L) 13.0 - 17.0 g/dL   HCT 87.5 (L) 64.3 - 32.9 %   MCV 91.8 80.0 - 100.0 fL   MCH 29.9 26.0 - 34.0 pg   MCHC 32.6 30.0 - 36.0 g/dL   RDW 51.8 84.1 - 66.0 %   Platelets 294 150 - 400 K/uL   nRBC 0.0 0.0 - 0.2 %   Neutrophils Relative % 78 %   Neutro Abs 10.3 (H) 1.7 - 7.7 K/uL   Lymphocytes Relative 11 %   Lymphs Abs 1.4 0.7 - 4.0 K/uL   Monocytes Relative 7 %   Monocytes Absolute 0.9 0.1 - 1.0 K/uL   Eosinophils Relative 2 %   Eosinophils Absolute 0.3 0.0 - 0.5 K/uL   Basophils Relative 1 %   Basophils Absolute 0.1 0.0 - 0.1 K/uL   Immature Granulocytes 1 %   Abs Immature Granulocytes 0.09 (H) 0.00 - 0.07 K/uL    Comment: Performed at [facility], [address] [place], [place] [address]  Protime-INR     Status: None   Collection Time: 05/26/23  8:07 AM  Result Value Ref Range   Prothrombin Time 13.7 11.4 - 15.2 seconds   INR 1.0 0.8 - 1.2    Comment: (NOTE) INR goal varies based on device and disease states. Performed at [facility], [address] [place], [place] [address]   APTT     Status: None   Collection Time: 05/26/23  8:07 AM  Result Value Ref Range   aPTT 32 24 - 36 seconds    Comment: Performed at [facility], [address] [place], [place] [address]  Hemoglobin A1c     Status: Abnormal   Collection Time: 05/26/23  8:07 AM  Result Value Ref Range   Hgb A1c MFr Bld 7.6 (H) 4.8 - 5.6 %    Comment: (NOTE) Pre diabetes:           5.7%-6.4%  Diabetes:              >6.4%  Glycemic control for   <7.0% adults with diabetes    Mean Plasma Glucose 171.42 mg/dL    Comment: Performed at [facility], [address], [place], [place] [address]  Blood gas, venous     Status: Abnormal   Collection Time: 05/26/23  8:30 AM  Result Value Ref Range   pH, Ven 7.39 7.25 - 7.43   pCO2, Ven 49 44 - 60 mmHg   Bicarbonate 29.7 (H) 20.0 - 28.0 mmol/L   Acid-Base Excess 3.7 (H) 0.0 - 2.0 mmol/L   O2 Saturation 44.4 %   Patient temperature 37.0    Collection site VEIN     Comment: Performed at [facility], [address] [place], [place] [address]  Troponin I (High Sensitivity)  Status: Abnormal   Collection Time: 05/26/23  9:50 AM  Result Value Ref Range   Troponin I (High Sensitivity) 353 (HH) <18 ng/L    Comment: CRITICAL VALUE NOTED. VALUE IS CONSISTENT WITH PREVIOUSLY REPORTED/CALLED VALUE MW (NOTE) Elevated high sensitivity troponin I (hsTnI) values and significant  changes across serial measurements may suggest ACS but many other  chronic and acute conditions are known to elevate hsTnI results.  Refer to the "Links" section for chest pain algorithms and additional  guidance. Performed at [facility], [address] [place], [place] [address]   Glucose, capillary     Status: Abnormal   Collection Time: 05/26/23 12:45 PM  Result Value Ref Range   Glucose-Capillary 208 (H) 70 - 99 mg/dL    Comment: Glucose reference range applies only to samples taken after fasting for at least 8 hours.  MRSA Next Gen by PCR, Nasal     Status: Abnormal   Collection Time: 05/26/23  1:35 PM   Specimen: Nasal Mucosa; Nasal Swab  Result Value Ref Range   MRSA by PCR Next Gen DETECTED (A) NOT DETECTED    Comment: RESULT CALLED TO, READ BACK BY AND VERIFIED WITH:  PRAWEENA BHUSAL 05/26/2023 1748 CP (NOTE) The GeneXpert MRSA Assay (FDA approved for NASAL specimens only), is one component of a  comprehensive MRSA colonization surveillance program. It is not intended to diagnose MRSA infection nor to guide or monitor treatment for MRSA infections. Test performance is not FDA approved in patients less than 20 years old. Performed at [facility], [address] [place], [place] [address]   ECHOCARDIOGRAM COMPLETE     Status: None   Collection Time: 05/26/23  2:45 PM  Result Value Ref Range   Weight 4,924.8 oz   Height 70 in   BP 170/93 mmHg   Ao pk vel 1.47 m/s   AV Area VTI 1.95 cm2   AR max vel 2.40 cm2   AV Mean grad 3.0 mmHg   AV Peak grad 8.6 mmHg   S' Lateral 5.20 cm   AV Area mean vel 2.78 cm2   Area-P 1/2 2.91 cm2   MV VTI 2.65 cm2   Est EF 30 - 35%   Troponin I (High Sensitivity)     Status: Abnormal   Collection Time: 05/26/23  3:25 PM  Result Value Ref Range   Troponin I (High Sensitivity) 462 (HH) <18 ng/L    Comment: CRITICAL VALUE NOTED. VALUE IS CONSISTENT WITH PREVIOUSLY REPORTED/CALLED VALUE MU (NOTE) Elevated high sensitivity troponin I (hsTnI) values and significant  changes across serial measurements may suggest ACS but many other  chronic and acute conditions are known to elevate hsTnI results.  Refer to the "Links" section for chest pain algorithms and additional  guidance. Performed at [facility], [address] [place], [place] [address]   Glucose, capillary     Status: Abnormal   Collection Time: 05/26/23  4:01 PM  Result Value Ref Range   Glucose-Capillary 197 (H) 70 - 99 mg/dL    Comment: Glucose reference range applies only to samples taken after fasting for at least 8 hours.  Heparin level (unfractionated)     Status: Abnormal   Collection Time: 05/26/23  6:26 PM  Result Value Ref Range   Heparin Unfractionated 0.19 (L) 0.30 - 0.70 IU/mL    Comment: (NOTE) The clinical reportable range upper limit is being lowered to >1.10 to align with the FDA approved guidance for the current laboratory  assay.  If  heparin results are below expected values, and patient dosage has  been confirmed, suggest follow up testing of antithrombin III levels. Performed at [facility], [address] [place], [place] [address]   Glucose, capillary     Status: Abnormal   Collection Time: 05/26/23  9:24 PM  Result Value Ref Range   Glucose-Capillary 204 (H) 70 - 99 mg/dL    Comment: Glucose reference range applies only to samples taken after fasting for at least 8 hours.  Basic metabolic panel     Status: Abnormal   Collection Time: 05/27/23  4:16 AM  Result Value Ref Range   Sodium 139 135 - 145 mmol/L   Potassium 3.6 3.5 - 5.1 mmol/L   Chloride 101 98 - 111 mmol/L   CO2 27 22 - 32 mmol/L   Glucose, Bld 154 (H) 70 - 99 mg/dL    Comment: Glucose reference range applies only to samples taken after fasting for at least 8 hours.   BUN 53 (H) 8 - 23 mg/dL   Creatinine, Ser 5.78 (H) 0.61 - 1.24 mg/dL   Calcium 8.3 (L) 8.9 - 10.3 mg/dL   GFR, Estimated 16 (L) >60 mL/min    Comment: (NOTE) Calculated using the CKD-EPI Creatinine Equation (2021)    Anion gap 11 5 - 15    Comment: Performed at [facility], [address] [place], [place] [address]  CBC     Status: Abnormal   Collection Time: 05/27/23  4:16 AM  Result Value Ref Range   WBC 10.9 (H) 4.0 - 10.5 K/uL   RBC 3.46 (L) 4.22 - 5.81 MIL/uL   Hemoglobin 10.4 (L) 13.0 - 17.0 g/dL   HCT 95.2 (L) 84.1 - 32.4 %   MCV 93.1 80.0 - 100.0 fL   MCH 30.1 26.0 - 34.0 pg   MCHC 32.3 30.0 - 36.0 g/dL   RDW 40.1 02.7 - 25.3 %   Platelets 288 150 - 400 K/uL   nRBC 0.0 0.0 - 0.2 %    Comment: Performed at [facility], [address] [place], [place] [address]  Heparin level (unfractionated)     Status: None   Collection Time: 05/27/23  4:16 AM  Result Value Ref Range   Heparin Unfractionated 0.45 0.30 - 0.70 IU/mL    Comment: (NOTE) The clinical reportable range upper limit is being lowered to >1.10 to align with the FDA  approved guidance for the current laboratory assay.  If heparin results are below expected values, and patient dosage has  been confirmed, suggest follow up testing of antithrombin III levels. Performed at [facility], [address] [place], [place] [address]   Glucose, capillary     Status: Abnormal   Collection Time: 05/27/23  8:16 AM  Result Value Ref Range   Glucose-Capillary 135 (H) 70 - 99 mg/dL    Comment: Glucose reference range applies only to samples taken after fasting for at least 8 hours.  Hepatitis B surface antigen     Status: None   Collection Time: 05/27/23 11:15 AM  Result Value Ref Range   Hepatitis B Surface Ag NON REACTIVE NON REACTIVE    Comment: Performed at [facility], [address] [place], [place] [address]  Hepatitis B surface antibody,quantitative     Status: Abnormal   Collection Time: 05/27/23 11:15 AM  Result Value Ref Range   Hep B S AB Quant (Post) <3.5 (L) Immunity>10 mIU/mL  Comment: (NOTE)  Status of Immunity                     Anti-HBs Level  ------------------                     -------------- Inconsistent with Immunity                  0.0 - 10.0 Consistent with Immunity                         >10.0 Performed At: [facility] [address] [place], [place] [id number] Jolene Schimke MD WU:[id number]   Glucose, capillary     Status: Abnormal   Collection Time: 05/27/23 11:27 AM  Result Value Ref Range   Glucose-Capillary 145 (H) 70 - 99 mg/dL    Comment: Glucose reference range applies only to samples taken after fasting for at least 8 hours.  Glucose, capillary     Status: Abnormal   Collection Time: 05/27/23  4:51 PM  Result Value Ref Range   Glucose-Capillary 116 (H) 70 - 99 mg/dL    Comment: Glucose reference range applies only to samples taken after fasting for at least 8 hours.  Glucose, capillary     Status: Abnormal   Collection Time: 05/27/23  8:36 PM  Result Value Ref Range    Glucose-Capillary 269 (H) 70 - 99 mg/dL    Comment: Glucose reference range applies only to samples taken after fasting for at least 8 hours.  Basic metabolic panel     Status: Abnormal   Collection Time: 05/28/23  4:07 AM  Result Value Ref Range   Sodium 136 135 - 145 mmol/L   Potassium 3.5 3.5 - 5.1 mmol/L   Chloride 99 98 - 111 mmol/L   CO2 28 22 - 32 mmol/L   Glucose, Bld 219 (H) 70 - 99 mg/dL    Comment: Glucose reference range applies only to samples taken after fasting for at least 8 hours.   BUN 41 (H) 8 - 23 mg/dL   Creatinine, Ser 9.56 (H) 0.61 - 1.24 mg/dL   Calcium 7.9 (L) 8.9 - 10.3 mg/dL   GFR, Estimated 22 (L) >60 mL/min    Comment: (NOTE) Calculated using the CKD-EPI Creatinine Equation (2021)    Anion gap 9 5 - 15    Comment: Performed at [facility], [address] [place], [place] [address]  Glucose, capillary     Status: Abnormal   Collection Time: 05/28/23  8:08 AM  Result Value Ref Range   Glucose-Capillary 189 (H) 70 - 99 mg/dL    Comment: Glucose reference range applies only to samples taken after fasting for at least 8 hours.  Glucose, capillary     Status: Abnormal   Collection Time: 05/28/23 11:59 AM  Result Value Ref Range   Glucose-Capillary 268 (H) 70 - 99 mg/dL    Comment: Glucose reference range applies only to samples taken after fasting for at least 8 hours.  Glucose, capillary     Status: Abnormal   Collection Time: 05/28/23  6:16 PM  Result Value Ref Range   Glucose-Capillary 135 (H) 70 - 99 mg/dL    Comment: Glucose reference range applies only to samples taken after fasting for at least 8 hours.  Glucose, capillary     Status: Abnormal   Collection Time: 05/28/23  9:10 PM  Result Value Ref Range   Glucose-Capillary 169 (  H) 70 - 99 mg/dL    Comment: Glucose reference range applies only to samples taken after fasting for at least 8 hours.  Basic metabolic panel     Status: Abnormal   Collection Time: 05/29/23  3:33 AM  Result  Value Ref Range   Sodium 132 (L) 135 - 145 mmol/L   Potassium 3.7 3.5 - 5.1 mmol/L   Chloride 98 98 - 111 mmol/L   CO2 25 22 - 32 mmol/L   Glucose, Bld 200 (H) 70 - 99 mg/dL    Comment: Glucose reference range applies only to samples taken after fasting for at least 8 hours.   BUN 34 (H) 8 - 23 mg/dL   Creatinine, Ser 7.82 (H) 0.61 - 1.24 mg/dL   Calcium 8.1 (L) 8.9 - 10.3 mg/dL   GFR, Estimated 25 (L) >60 mL/min    Comment: (NOTE) Calculated using the CKD-EPI Creatinine Equation (2021)    Anion gap 9 5 - 15    Comment: Performed at [facility], [address] [place], [place] [address]  CBC with Differential/Platelet     Status: Abnormal   Collection Time: 05/29/23  4:52 AM  Result Value Ref Range   WBC 11.7 (H) 4.0 - 10.5 K/uL   RBC 4.00 (L) 4.22 - 5.81 MIL/uL   Hemoglobin 11.7 (L) 13.0 - 17.0 g/dL   HCT 30.8 (L) 65.7 - 84.6 %   MCV 90.5 80.0 - 100.0 fL   MCH 29.3 26.0 - 34.0 pg   MCHC 32.3 30.0 - 36.0 g/dL   RDW 96.2 95.2 - 84.1 %   Platelets 297 150 - 400 K/uL   nRBC 0.0 0.0 - 0.2 %   Neutrophils Relative % 64 %   Neutro Abs 7.7 1.7 - 7.7 K/uL   Lymphocytes Relative 20 %   Lymphs Abs 2.3 0.7 - 4.0 K/uL   Monocytes Relative 9 %   Monocytes Absolute 1.0 0.1 - 1.0 K/uL   Eosinophils Relative 5 %   Eosinophils Absolute 0.6 (H) 0.0 - 0.5 K/uL   Basophils Relative 1 %   Basophils Absolute 0.1 0.0 - 0.1 K/uL   Immature Granulocytes 1 %   Abs Immature Granulocytes 0.07 0.00 - 0.07 K/uL    Comment: Performed at [facility], [address] [place], [place] [address]  Glucose, capillary     Status: Abnormal   Collection Time: 05/29/23  8:09 AM  Result Value Ref Range   Glucose-Capillary 211 (H) 70 - 99 mg/dL    Comment: Glucose reference range applies only to samples taken after fasting for at least 8 hours.  Glucose, capillary     Status: Abnormal   Collection Time: 05/29/23 12:07 PM  Result Value Ref Range   Glucose-Capillary 357 (H) 70 - 99 mg/dL     Comment: Glucose reference range applies only to samples taken after fasting for at least 8 hours.    Radiology DG Chest 1 View  Result Date: 05/27/2023 CLINICAL DATA:  CHF. EXAM: CHEST  1 VIEW COMPARISON:  05/26/2023 FINDINGS: The cardio pericardial silhouette is enlarged. Stable chronic interstitial changes at the lung bases. Right central line tip overlies the inferior SVC level. No acute bony abnormality. Telemetry leads overlie the chest. IMPRESSION: 1. No acute cardiopulmonary findings. 2. Right central line tip overlies the inferior SVC level. Electronically Signed   By: Kennith Center M.D.   On: 05/27/2023 07:48   ECHOCARDIOGRAM COMPLETE  Result Date: 05/26/2023    ECHOCARDIOGRAM REPORT  Patient Name:   James Clarke Asselin Date of Exam: 05/26/2023 Medical Rec #:  [id number]       Height:       70.0 in Accession #:    [id number]      Weight:       307.8 lb Date of Birth:  10-Dec-1940      BSA:          2.507 m Patient Age:    81 years        BP:           170/93 mmHg Patient Gender: M               HR:           71 bpm. Exam Location:  [facility] Procedure: 2D Echo, Cardiac Doppler, Color Doppler and Intracardiac            Opacification Agent Indications:     CHF  History:         Patient has prior history of Echocardiogram examinations, most                  recent 12/17/2022. CHF, CAD, Signs/Symptoms:Edema; Risk                  Factors:Hypertension, Diabetes, Dyslipidemia and Sleep Apnea.                  ESRD on Dialysis.  Sonographer:     Mikki Harbor Referring Phys:  [id number] Emeline General Diagnosing Phys: Lorine Bears MD  Sonographer Comments: Technically difficult study due to poor echo windows and patient is obese. IMPRESSIONS  1. Left ventricular ejection fraction, by estimation, is 30 to 35%. The left ventricle has moderately decreased function. Left ventricular endocardial border not optimally defined to evaluate regional wall motion. The left ventricular internal cavity size was mildly  dilated. There is moderate left ventricular hypertrophy. Left ventricular diastolic parameters are indeterminate.  2. Right ventricular systolic function is normal. The right ventricular size is normal. Tricuspid regurgitation signal is inadequate for assessing PA pressure.  3. Left atrial size was moderately dilated.  4. Right atrial size was mildly dilated.  5. The mitral valve is normal in structure. Mild mitral valve regurgitation. No evidence of mitral stenosis.  6. The aortic valve is normal in structure. Aortic valve regurgitation is not visualized. Aortic valve sclerosis is present, with no evidence of aortic valve stenosis. Comparison(s): A prior study was performed on 11/17/22. Changes from prior study are noted. The left ventricular function is significantly worse. FINDINGS  Left Ventricle: Left ventricular ejection fraction, by estimation, is 30 to 35%. The left ventricle has moderately decreased function. Left ventricular endocardial border not optimally defined to evaluate regional wall motion. Definity contrast agent was given IV to delineate the left ventricular endocardial borders. The left ventricular internal cavity size was mildly dilated. There is moderate left ventricular hypertrophy. Left ventricular diastolic parameters are indeterminate. Right Ventricle: The right ventricular size is normal. No increase in right ventricular wall thickness. Right ventricular systolic function is normal. Tricuspid regurgitation signal is inadequate for assessing PA pressure. The tricuspid regurgitant velocity is 2.18 m/s, and with an assumed right atrial pressure of 3 mmHg, the estimated right ventricular systolic pressure is 22.0 mmHg. Left Atrium: Left atrial size was moderately dilated. Right Atrium: Right atrial size was mildly dilated. Pericardium: Trivial pericardial effusion is present. Mitral Valve: The mitral valve is normal in structure. Mild mitral valve regurgitation. No evidence  of mitral valve  stenosis. MV peak gradient, 3.2 mmHg. The mean mitral valve gradient is 1.0 mmHg. Tricuspid Valve: The tricuspid valve is normal in structure. Tricuspid valve regurgitation is trivial. No evidence of tricuspid stenosis. Aortic Valve: The aortic valve is normal in structure. Aortic valve regurgitation is not visualized. Aortic valve sclerosis is present, with no evidence of aortic valve stenosis. Aortic valve mean gradient measures 3.0 mmHg. Aortic valve peak gradient measures 8.6 mmHg. Aortic valve area, by VTI measures 1.95 cm. Pulmonic Valve: The pulmonic valve was normal in structure. Pulmonic valve regurgitation is not visualized. No evidence of pulmonic stenosis. Aorta: The aortic root is normal in size and structure. IAS/Shunts: No atrial level shunt detected by color flow Doppler.  LEFT VENTRICLE PLAX 2D LVIDd:         5.70 cm   Diastology LVIDs:         5.20 cm   LV e' medial:    5.22 cm/s LV PW:         1.20 cm   LV E/e' medial:  12.2 LV IVS:        1.40 cm   LV e' lateral:   5.00 cm/s LVOT diam:     2.10 cm   LV E/e' lateral: 12.7 LV SV:         67 LV SV Index:   27 LVOT Area:     3.46 cm  RIGHT VENTRICLE RV Basal diam:  4.50 cm RV Mid diam:    4.60 cm RV S prime:     16.80 cm/s LEFT ATRIUM              Index        RIGHT ATRIUM           Index LA diam:        4.10 cm  1.64 cm/m   RA Area:     23.70 cm LA Vol (A2C):   117.0 ml 46.67 ml/m  RA Volume:   74.20 ml  29.60 ml/m LA Vol (A4C):   86.7 ml  34.58 ml/m LA Biplane Vol: 110.0 ml 43.88 ml/m  AORTIC VALVE                    PULMONIC VALVE AV Area (Vmax):    2.40 cm     PV Vmax:       1.06 m/s AV Area (Vmean):   2.78 cm     PV Peak grad:  4.5 mmHg AV Area (VTI):     1.95 cm AV Vmax:           147.00 cm/s AV Vmean:          80.600 cm/s AV VTI:            0.342 m AV Peak Grad:      8.6 mmHg AV Mean Grad:      3.0 mmHg LVOT Vmax:         102.00 cm/s LVOT Vmean:        64.800 cm/s LVOT VTI:          0.193 m LVOT/AV VTI ratio: 0.56  AORTA Ao Root  diam: 3.80 cm MITRAL VALVE               TRICUSPID VALVE MV Area (PHT): 2.91 cm    TR Peak grad:   19.0 mmHg MV Area VTI:   2.65 cm    TR Vmax:  218.00 cm/s MV Peak grad:  3.2 mmHg MV Mean grad:  1.0 mmHg    SHUNTS MV Vmax:       0.89 m/s    Systemic VTI:  0.19 m MV Vmean:      49.9 cm/s   Systemic Diam: 2.10 cm MV Decel Time: 261 msec MV E velocity: 63.70 cm/s MV A velocity: 44.00 cm/s MV E/A ratio:  1.45 Lorine Bears MD Electronically signed by Lorine Bears MD Signature Date/Time: 05/26/2023/5:43:57 PM    Final    DG Chest Port 1 View  Result Date: 05/26/2023 CLINICAL DATA:  Chest pain.  Shortness of breath. EXAM: PORTABLE CHEST 1 VIEW COMPARISON:  12/13/2022. FINDINGS: Low lung volume. There is stable mild increased bronchovascular markings without frank pulmonary edema. There are probable atelectatic changes at the lung bases. Bilateral lung fields are otherwise clear. There is blunting of bilateral costophrenic angles, which may represent small bilateral pleural effusions. Stable moderately enlarged cardio-mediastinal silhouette. No acute osseous abnormalities. The soft tissues are within normal limits. There is right IJ hemodialysis catheter with its tip overlying the cavoatrial junction region. IMPRESSION: *Low lung volume. No frank pulmonary edema. Probable bilateral pleural effusions and atelectatic changes at the lung bases. Right IJ hemodialysis catheter noted. Electronically Signed   By: Jules Schick M.D.   On: 05/26/2023 08:54    Assessment/Plan  ESRD on dialysis (HCC) At this time, I think it would be prudent to complete his cardiac workup and try to get his blood pressure more stabilized before we proceed with any surgery.  With his recurrent hypotension, I am just not sure that he is going to support any access placed in the arm, and I hate to put a high risk patient through the surgery with a high risk of failure due to hypotension.  I am going to give him at least 6 to 8 weeks  to try to stabilize and see if he can get his blood pressure up and more stable before considering surgery.  Will see him back to discuss options after the first of the year.  He does have good anatomy for a left brachiocephalic AV fistula once he is medically stable.  Type 2 diabetes mellitus with renal manifestations (HCC) blood glucose control important in reducing the progression of atherosclerotic disease. Also, involved in wound healing. On appropriate medications.     Hyperlipemia lipid control important in reducing the progression of atherosclerotic disease. Continue statin therapy     Swelling of limb Some better with dialysis  Festus Barren, MD  06/03/2023 6:28 PM    This note was created with Dragon medical transcription system.  Any errors from dictation are purely unintentional

## 2023-06-03 NOTE — Assessment & Plan Note (Signed)
At this time, I think it would be prudent to complete his cardiac workup and try to get his blood pressure more stabilized before we proceed with any surgery.  With his recurrent hypotension, I am just not sure that he is going to support any access placed in the arm, and I hate to put a high risk patient through the surgery with a high risk of failure due to hypotension.  I am going to give him at least 6 to 8 weeks to try to stabilize and see if he can get his blood pressure up and more stable before considering surgery.  Will see him back to discuss options after the first of the year.  He does have good anatomy for a left brachiocephalic AV fistula once he is medically stable.

## 2023-06-04 ENCOUNTER — Other Ambulatory Visit: Payer: PPO

## 2023-06-05 ENCOUNTER — Encounter: Payer: PPO | Admitting: Family

## 2023-06-05 ENCOUNTER — Other Ambulatory Visit: Payer: PPO

## 2023-06-06 DIAGNOSIS — G4733 Obstructive sleep apnea (adult) (pediatric): Secondary | ICD-10-CM | POA: Diagnosis not present

## 2023-06-10 ENCOUNTER — Encounter
Admission: RE | Admit: 2023-06-10 | Discharge: 2023-06-10 | Disposition: A | Discharge status: Home / Self Care | Payer: PPO | Source: Ambulatory Visit | Attending: Student | Admitting: Student

## 2023-06-10 DIAGNOSIS — I2510 Atherosclerotic heart disease of native coronary artery without angina pectoris: Secondary | ICD-10-CM | POA: Insufficient documentation

## 2023-06-10 MED ORDER — TECHNETIUM TC 99M TETROFOSMIN IV KIT
30.7900 | PACK | Freq: Once | INTRAVENOUS | Status: AC | PRN
Start: 2023-06-10 — End: 2023-06-10
  Administered 2023-06-10: 30.79 via INTRAVENOUS

## 2023-06-10 MED ORDER — REGADENOSON 0.4 MG/5ML IV SOLN
0.4000 mg | Freq: Once | INTRAVENOUS | Status: AC
  Administered 2023-06-10: 0.4 mg via INTRAVENOUS

## 2023-06-11 ENCOUNTER — Encounter
Admission: RE | Admit: 2023-06-11 | Discharge: 2023-06-11 | Disposition: A | Discharge status: Home / Self Care | Payer: PPO | Source: Ambulatory Visit | Attending: Student | Admitting: Student

## 2023-06-11 DIAGNOSIS — E1121 Type 2 diabetes mellitus with diabetic nephropathy: Secondary | ICD-10-CM | POA: Diagnosis not present

## 2023-06-12 ENCOUNTER — Encounter: Payer: PPO | Admitting: Family

## 2023-06-12 MED ORDER — TECHNETIUM TC 99M TETROFOSMIN IV KIT
30.0000 | PACK | Freq: Once | INTRAVENOUS | Status: AC | PRN
Start: 2023-06-12 — End: 2023-06-12
  Administered 2023-06-12: 31.49 via INTRAVENOUS

## 2023-06-16 ENCOUNTER — Other Ambulatory Visit: Payer: Self-pay | Admitting: Urology

## 2023-06-17 DIAGNOSIS — B351 Tinea unguium: Secondary | ICD-10-CM | POA: Diagnosis not present

## 2023-06-17 DIAGNOSIS — E1140 Type 2 diabetes mellitus with diabetic neuropathy, unspecified: Secondary | ICD-10-CM | POA: Diagnosis not present

## 2023-06-17 DIAGNOSIS — Z794 Long term (current) use of insulin: Secondary | ICD-10-CM | POA: Diagnosis not present

## 2023-06-21 DIAGNOSIS — G4733 Obstructive sleep apnea (adult) (pediatric): Secondary | ICD-10-CM | POA: Diagnosis not present

## 2023-06-21 DIAGNOSIS — N186 End stage renal disease: Secondary | ICD-10-CM | POA: Diagnosis not present

## 2023-06-21 DIAGNOSIS — Z992 Dependence on renal dialysis: Secondary | ICD-10-CM | POA: Diagnosis not present

## 2023-06-23 DIAGNOSIS — D509 Iron deficiency anemia, unspecified: Secondary | ICD-10-CM | POA: Diagnosis not present

## 2023-06-23 DIAGNOSIS — Z992 Dependence on renal dialysis: Secondary | ICD-10-CM | POA: Diagnosis not present

## 2023-06-23 DIAGNOSIS — N186 End stage renal disease: Secondary | ICD-10-CM | POA: Diagnosis not present

## 2023-06-23 LAB — NM MYOCAR MULTI W/SPECT W/WALL MOTION / EF
Base ST Depression (mm): 0 mm
Estimated workload: 1
Exercise duration (min): 1 min
Exercise duration (sec): 0 s
LV dias vol: 120 mL (ref 62–150)
LV sys vol: 61 mL
MPHR: 138 {beats}/min
Nuc Stress EF: 49 %
Peak HR: 76 {beats}/min
Percent HR: 55 %
Rest HR: 58 {beats}/min
Rest Nuclear Isotope Dose: 31.5 mCi
SDS: 0
SRS: 15
SSS: 6
ST Depression (mm): 0 mm
Stress Nuclear Isotope Dose: 30.8 mCi
TID: 0.89

## 2023-06-24 ENCOUNTER — Encounter: Payer: PPO | Admitting: Family

## 2023-06-24 DIAGNOSIS — Z992 Dependence on renal dialysis: Secondary | ICD-10-CM | POA: Diagnosis not present

## 2023-06-24 DIAGNOSIS — N186 End stage renal disease: Secondary | ICD-10-CM | POA: Diagnosis not present

## 2023-06-24 DIAGNOSIS — W19XXXA Unspecified fall, initial encounter: Secondary | ICD-10-CM | POA: Diagnosis not present

## 2023-06-24 DIAGNOSIS — E1122 Type 2 diabetes mellitus with diabetic chronic kidney disease: Secondary | ICD-10-CM | POA: Diagnosis not present

## 2023-06-24 DIAGNOSIS — Z794 Long term (current) use of insulin: Secondary | ICD-10-CM | POA: Diagnosis not present

## 2023-06-24 DIAGNOSIS — Y92009 Unspecified place in unspecified non-institutional (private) residence as the place of occurrence of the external cause: Secondary | ICD-10-CM | POA: Diagnosis not present

## 2023-06-24 DIAGNOSIS — S83411A Sprain of medial collateral ligament of right knee, initial encounter: Secondary | ICD-10-CM | POA: Diagnosis not present

## 2023-06-24 DIAGNOSIS — I10 Essential (primary) hypertension: Secondary | ICD-10-CM | POA: Diagnosis not present

## 2023-06-24 DIAGNOSIS — I509 Heart failure, unspecified: Secondary | ICD-10-CM | POA: Diagnosis not present

## 2023-07-01 ENCOUNTER — Ambulatory Visit: Payer: PPO | Attending: Family | Admitting: Family

## 2023-07-01 ENCOUNTER — Encounter: Payer: Self-pay | Admitting: Family

## 2023-07-01 VITALS — BP 146/63 | HR 74 | Wt 304.0 lb

## 2023-07-01 DIAGNOSIS — E1140 Type 2 diabetes mellitus with diabetic neuropathy, unspecified: Secondary | ICD-10-CM | POA: Diagnosis not present

## 2023-07-01 DIAGNOSIS — Z7984 Long term (current) use of oral hypoglycemic drugs: Secondary | ICD-10-CM | POA: Insufficient documentation

## 2023-07-01 DIAGNOSIS — Z79899 Other long term (current) drug therapy: Secondary | ICD-10-CM | POA: Diagnosis not present

## 2023-07-01 DIAGNOSIS — I2510 Atherosclerotic heart disease of native coronary artery without angina pectoris: Secondary | ICD-10-CM | POA: Diagnosis not present

## 2023-07-01 DIAGNOSIS — E1122 Type 2 diabetes mellitus with diabetic chronic kidney disease: Secondary | ICD-10-CM | POA: Insufficient documentation

## 2023-07-01 DIAGNOSIS — R531 Weakness: Secondary | ICD-10-CM | POA: Diagnosis not present

## 2023-07-01 DIAGNOSIS — R55 Syncope and collapse: Secondary | ICD-10-CM | POA: Insufficient documentation

## 2023-07-01 DIAGNOSIS — R0989 Other specified symptoms and signs involving the circulatory and respiratory systems: Secondary | ICD-10-CM | POA: Insufficient documentation

## 2023-07-01 DIAGNOSIS — Z992 Dependence on renal dialysis: Secondary | ICD-10-CM | POA: Insufficient documentation

## 2023-07-01 DIAGNOSIS — Z794 Long term (current) use of insulin: Secondary | ICD-10-CM | POA: Insufficient documentation

## 2023-07-01 DIAGNOSIS — J45909 Unspecified asthma, uncomplicated: Secondary | ICD-10-CM | POA: Insufficient documentation

## 2023-07-01 DIAGNOSIS — I10 Essential (primary) hypertension: Secondary | ICD-10-CM

## 2023-07-01 DIAGNOSIS — I132 Hypertensive heart and chronic kidney disease with heart failure and with stage 5 chronic kidney disease, or end stage renal disease: Secondary | ICD-10-CM | POA: Diagnosis not present

## 2023-07-01 DIAGNOSIS — I5022 Chronic systolic (congestive) heart failure: Secondary | ICD-10-CM

## 2023-07-01 DIAGNOSIS — E785 Hyperlipidemia, unspecified: Secondary | ICD-10-CM | POA: Diagnosis not present

## 2023-07-01 DIAGNOSIS — N186 End stage renal disease: Secondary | ICD-10-CM | POA: Insufficient documentation

## 2023-07-01 DIAGNOSIS — M1990 Unspecified osteoarthritis, unspecified site: Secondary | ICD-10-CM | POA: Insufficient documentation

## 2023-07-01 DIAGNOSIS — F32A Depression, unspecified: Secondary | ICD-10-CM | POA: Insufficient documentation

## 2023-07-01 DIAGNOSIS — R079 Chest pain, unspecified: Secondary | ICD-10-CM | POA: Diagnosis not present

## 2023-07-01 DIAGNOSIS — I428 Other cardiomyopathies: Secondary | ICD-10-CM | POA: Diagnosis not present

## 2023-07-01 DIAGNOSIS — Z8616 Personal history of COVID-19: Secondary | ICD-10-CM | POA: Insufficient documentation

## 2023-07-01 DIAGNOSIS — I5032 Chronic diastolic (congestive) heart failure: Secondary | ICD-10-CM | POA: Insufficient documentation

## 2023-07-01 DIAGNOSIS — D631 Anemia in chronic kidney disease: Secondary | ICD-10-CM | POA: Diagnosis not present

## 2023-07-01 NOTE — Progress Notes (Signed)
PCP: James Flakes, MD (last seen 09/24) Primary Cardiologist: James Peng, MD (last seen 11/24) HF provider: Macario Golds, MD (last seen 11/24)  HPI:  James Clarke is a 82 y/o male with a history of asthma, IDDM, hyperlipidemia, HTN, nonobstructive CAD, CKD, bell's palsy, sleep apnea and chronic heart failure. Had Percutaneous transluminal angioplasty of the distal forearm cephalic vein with 5 mm diameter by 6 cm in length Lutonix drug-coated angioplasty balloon 01/13/23. Started dialysis 09/24.  Admitted 06/12/22 due to worsening shortness of breath. Patient states that he has had shortness of breath for several days but worse 1 day prior to his admission. He states that he slept in his recliner with his CPAP because he could not breathe laying flat. Cardiology & nephrology consults obtained. Diuretic changed. PT/OT evaluations done. Discharged after 8 days. Admitted 12/13/22 due to due to increased shortness of breath and chest pain. 2 weeks ago, he had a rapid worsening in his dyspnea on exertion and shortness of breath at rest. He has not had any increased urine output since increasing his diuretic regimen as instructed by his nephrologist. Patient was seen on 12/09/2022 by nephrology at which time he was started on IV Lasix 80 mg twice a week with oral Lasix on the rest of the days. Placed on lasix drip with transition to oral diuretics.   Admitted 05/26/23 due to worsening SOB and new onset chest pain. Developed hypotension after being on dialysis for [redacted] weeks along with symptomatic dizziness at the end of dialysis. Dialysis day meds adjusted but symptoms persisted. Night prior to admission, chest pain developed that woke him up. No improvement with SL NTG. No change in weight. CXR showing pulmonary congestion. First troponin was 300. Heparin and IV lasix given. Echo 05/26/23: EF 30-35% with moderate LVH, mild James. Nephrology/ cardiology consulted and dialysis done.   Echo 02/26/22: EF of 55-60% along  with mild LVH, LAE and trivial James. Echo 07/09/22: EF of 45% along with moderate LVH. Echo 12/17/22: EF 50-55% along with mild LAE.    Echo 05/26/23: EF 30-35% with moderate LVH, mild James  He presents today for a HF follow-up visit with a chief complaint of moderate shortness of breath with minimal exertion. Chronic in nature. Has associated fatigue, dizziness & weakness along with this. Denies chest pain, palpitations, cough, abdominal distention. Gets up a couple of times during the night to urinate and has difficulty falling back asleep. Is continuing to have issues with his BP getting low on dialysis days (M, W, F) and then high on non-dialysis days. 2 weeks ago, he felt dizzy and had syncopal event at home. Denies hitting his head. Says that these syncopal events just happen but, again, they seem to be occurring on dialysis days. He says that after he started dialysis, he became depressed and suicidal where his kids removed all guns from his home. Is now being treated with sertraline and is feeling much better without any suicidal thoughts.   On dialysis days, he doesn't take entresto, metoprolol or lasix. He also takes 10mg  midodrine on dialysis days. Thinks that he feels so bad because of the roller coaster that his BP does. His fistula in his arm collapsed (they think due to hypotension) & had to have permcath placed.   Saw the [facility] HF provider and says that he does not plan to return.   Previous cardiac studies:  LHC 12/31/21:    Mid LAD lesion is 60% stenosed.   Prox RCA lesion is  20% stenosed.   Dist RCA lesion is 20% stenosed.   Ost Cx to Prox Cx lesion is 20% stenosed.   The left ventricular systolic function is normal.   LV end diastolic pressure is moderately elevated.   The left ventricular ejection fraction is greater than 65% by visual estimate.  Patient has significant elevated end-diastolic pressure  Stress test 06/10/23: no ischemia, low risk study  ROS: All systems negative  except as listed in HPI, PMH and Problem List.  SH:  Social History   Socioeconomic History   Marital status: Married    Spouse name: Not on file   Number of children: Not on file   Years of education: Not on file   Highest education level: Not on file  Occupational History   Not on file  Tobacco Use   Smoking status: Never    Passive exposure: Never   Smokeless tobacco: Never  Vaping Use   Vaping status: Never Used  Substance and Sexual Activity   Alcohol use: No   Drug use: Not Currently   Sexual activity: Yes    Birth control/protection: None  Other Topics Concern   Not on file  Social History Narrative   Not on file   Social Determinants of Health   Financial Resource Strain: Low Risk  (06/24/2023)   Received from [facility]   Overall Financial Resource Strain (CARDIA)    Difficulty of Paying Living Expenses: Not hard at all  Food Insecurity: No Food Insecurity (06/24/2023)   Received from [facility]   Hunger Vital Sign    Worried About Running Out of Food in the Last Year: Never true    Ran Out of Food in the Last Year: Never true  Transportation Needs: No Transportation Needs (06/24/2023)   Received from [facility] - Transportation    In the past 12 months, has lack of transportation kept you from medical appointments or from getting medications?: No    Lack of Transportation (Non-Medical): No  Physical Activity: Not on file  Stress: Not on file  Social Connections: Not on file  Intimate Partner Violence: Not At Risk (05/26/2023)   Humiliation, Afraid, Rape, and Kick questionnaire    Fear of Current or Ex-Partner: No    Emotionally Abused: No    Physically Abused: No    Sexually Abused: No    FH:  Family History  Problem Relation Age of Onset   Emphysema Mother    COPD Mother    Heart disease Mother    Brain cancer Father     Past Medical History:  Diagnosis Date   (HFpEF) heart  failure with preserved ejection fraction (HCC)    a.) TTE 11/26/2021: EF 45%, mod LVH, post HK, mod LAE, mild James/TR, G2DD; b.) TTE 02/26/2022: EF 55-60%, BAE, triv James, G2DD; c.) TTE 07/09/2022: EF 45%, post HK, LVH, mod LAE, triv TR/PR, mild James, G1DD   Anemia of chronic renal failure    Aortic atherosclerosis (HCC)    Asthma    Bell's palsy    BPH (benign prostatic hyperplasia)    CAD (coronary artery disease)    a.) LHC 12/31/2021: 60% mLAD, 20% pRCA, 20% dRCA, 20% o-pLCx --> med mgmt.   CKD (chronic kidney disease), stage IV (HCC)    Diabetic neuropathy (HCC)    Dyspnea on exertion    Ganglion cyst of wrist, right    a.) s/p excision 04/2011   Hepatic steatosis  History of 2019 novel coronavirus disease (COVID-19)    History of bilateral cataract extraction 2018   Hyperlipidemia    Hyperparathyroidism due to renal insufficiency (HCC)    Hypertension    Low testosterone in male    Nephrolithiasis    Obesity    OSA treated with BiPAP    Osteoarthritis    Peripheral edema    Right inguinal hernia    a.) s/p repair   Skin cancer    Type 2 diabetes mellitus with renal manifestations (HCC)     Current Outpatient Medications  Medication Sig Dispense Refill   acetaminophen (TYLENOL) 500 MG tablet Take 1,000 mg by mouth every 6 (six) hours as needed (shoulder pain).     albuterol (PROVENTIL HFA;VENTOLIN HFA) 108 (90 Base) MCG/ACT inhaler Inhale 2 puffs into the lungs every 6 (six) hours as needed for wheezing or shortness of breath.     aspirin EC 81 MG tablet Take by mouth.     calcitRIOL (ROCALTROL) 0.25 MCG capsule Take 0.25 mcg by mouth daily.     cetirizine (ZYRTEC) 10 MG chewable tablet Chew 10 mg by mouth daily as needed for allergies.     Continuous Blood Gluc Sensor (FREESTYLE LIBRE 14 DAY SENSOR) MISC by Does not apply route.     diazepam (VALIUM) 2 MG tablet Take 2 mg by mouth every 12 (twelve) hours as needed.     fluticasone (FLONASE) 50 MCG/ACT nasal spray as needed.      furosemide (LASIX) 40 MG tablet Take 2 tablets (80 mg total) by mouth 2 (two) times daily. On non dialysis days, Sunday, Tuesday, Thursday, Saturday and Sunday 120 tablet 0   GLOBAL INJECT EASE INSULIN SYR 31G X 5/16" 0.5 ML MISC Inject into the skin.     glucose blood (ONETOUCH ULTRA) test strip TEST BLOOD SUGAR 4 TIMES DAILY     insulin aspart (NOVOLOG) 100 UNIT/ML injection Inject 14 Units into the skin 3 (three) times daily with meals. (Patient taking differently: Inject 35 Units into the skin 3 (three) times daily with meals.) 10 mL 11   insulin detemir (LEVEMIR) 100 UNIT/ML injection Inject 0.5 mLs (50 Units total) into the skin at bedtime. (Patient taking differently: Inject 35 Units into the skin at bedtime.) 10 mL 1   isosorbide mononitrate (IMDUR) 30 MG 24 hr tablet Take 1 tablet (30 mg total) by mouth daily. 30 tablet 2   Lancets (ONETOUCH DELICA PLUS LANCET30G) MISC USE 1 LANCET 4 TIMES DAILY     linagliptin (TRADJENTA) 5 MG TABS tablet Take 1 tablet (5 mg total) by mouth daily. 30 tablet 0   lovastatin (MEVACOR) 40 MG tablet Take 40 mg by mouth daily with supper.     metoprolol succinate (TOPROL-XL) 50 MG 24 hr tablet Take 2 tablets (100 mg total) by mouth daily. Do not take on Monday, Wednesday and Friday on dialysis days 120 tablet 0   nitroGLYCERIN (NITROSTAT) 0.4 MG SL tablet Place 1 tablet (0.4 mg total) under the tongue every 5 (five) minutes as needed for chest pain. 20 tablet 1   potassium citrate (UROCIT-K) 10 MEQ (1080 MG) SR tablet TAKE (1) TABLET BY MOUTH TWICE DAILY WITH A MEAL. 60 tablet 11   sacubitril-valsartan (ENTRESTO) 24-26 MG Take 1 tablet by mouth 2 (two) times daily. 60 tablet 2   sertraline (ZOLOFT) 50 MG tablet Take 50 mg by mouth at bedtime.     tamsulosin (FLOMAX) 0.4 MG CAPS capsule TAKE (1) CAPSULE BY MOUTH  EVERY DAY 30 capsule 11   No current facility-administered medications for this visit.   Vitals:   07/01/23 1317  BP: (!) 146/63  Pulse: 74   SpO2: 97%  Weight: (!) 304 lb (137.9 kg)   Wt Readings from Last 3 Encounters:  07/01/23 (!) 304 lb (137.9 kg)  06/03/23 (!) 302 lb 9.6 oz (137.3 kg)  05/29/23 296 lb 15.4 oz (134.7 kg)   Lab Results  Component Value Date   CREATININE 2.52 (H) 05/29/2023   CREATININE 2.74 (H) 05/28/2023   CREATININE 3.66 (H) 05/27/2023    PHYSICAL EXAM:  General:  Well appearing. No resp difficulty HEENT: normal Neck: supple. JVP flat. No lymphadenopathy or thryomegaly appreciated. Cor: PMI normal. Regular rate & rhythm. No rubs, gallops or murmurs. Lungs: clear Abdomen: soft, nontender, nondistended. No hepatosplenomegaly. No bruits or masses.  Extremities: no cyanosis, clubbing, rash, trace pitting edema bilateral lower legs Neuro: alert & oriented x3, cranial nerves grossly intact. Moves all 4 extremities w/o difficulty. Affect pleasant.    ECG: not done   ASSESSMENT & PLAN:  1: NICM with mildly reduced ejection fraction- - suspect due to HTN/ DM as cath showed nonobstructive CAD - NYHA class III - euvolemic - weighing daily; reminded to call for an overnight weight gain of > 2 pounds or a weekly weight gain of >5 pounds - weight down 11 pounds from last visit here 4 months ago; now on dialysis M, W, F - Echo 12/17/22: EF 50-55% along with mild LAE.  - Echo 07/09/22: EF of 45% along with moderate LVH.  - Echo 02/26/22: EF of 55-60% along with mild LVH, LAE and trivial James.  - Echo 05/26/23: EF 30-35% with moderate LVH, mild James - continue furosemide 80mg  BID; can take evening dose earlier in the day; currently being held on dialysis days - continue metoprolol succinate 50mg  BID except currently being held on dialysis days - continue entresto 24/26mg  BID except currently being held on dialysis days - renal function limits other GDMT therapy - not adding salt and wife reads food labels for sodium content and understands to keep daily intake to 2000mg  - tries to keep daily fluid intake to  40 ounces - saw pulmonology Karna Christmas) 06/24 - saw HF provider (Fudim) 11/24; has no intention of returning - wearing compression socks daily with removal at bedtime - BNP 05/26/23 was 1047.2   2: HTN- - BP 146/63 - has midodrine 10mg  that he takes on dialysis days - saw PCP (Feldpausch) 12/24 - BMP 05/29/23 reviewed and showed sodium 132, potassium 3.7, creatinine 2.52 & GFR 25  3: DM with CKD/ ESRD- - A1c 05/26/23 was 7.6% - saw nephrology Cherylann Ratel) 08/24 - saw endocrinology (Solum) 07/24 - saw vascular (Dew) 11/24 - had percutaneous transluminal angioplasty of the distal forearm cephalic vein with 5 mm diameter by 6 cm in length Lutonix drug-coated angioplasty balloon 01/13/23  4: Non-obstructive CAD- - saw cardiology (cardiology) 09/24 - LHC 12/31/21:    Mid LAD lesion is 60% stenosed.   Prox RCA lesion is 20% stenosed.   Dist RCA lesion is 20% stenosed.   Ost Cx to Prox Cx lesion is 20% stenosed.   The left ventricular systolic function is normal.   LV end diastolic pressure is moderately elevated.   The left ventricular ejection fraction is greater than 65% by visual estimate.  Patient has significant elevated end-diastolic pressure   Have asked our HF pharmacist N Wise to reach out to patient to discuss  medications to see if there's a better solution to this meds with patient being on dialysis to limit the highs and lows of his blood pressure.   Offered to make our appointments PRN and he continue with the [facility] clinic but he declines and says that he plans to continue coming here. Will make next appointment for 3 months, sooner if needed.

## 2023-07-01 NOTE — Patient Instructions (Signed)
It was good to see you today!  If you receive a satisfaction survey regarding the [facility], please take the time to fill it out. This way we can continue to provide excellent care and make any changes that need to be made.

## 2023-07-03 DIAGNOSIS — S83411A Sprain of medial collateral ligament of right knee, initial encounter: Secondary | ICD-10-CM | POA: Diagnosis not present

## 2023-07-03 DIAGNOSIS — M1711 Unilateral primary osteoarthritis, right knee: Secondary | ICD-10-CM | POA: Diagnosis not present

## 2023-07-08 DIAGNOSIS — I10 Essential (primary) hypertension: Secondary | ICD-10-CM | POA: Diagnosis not present

## 2023-07-08 DIAGNOSIS — E1121 Type 2 diabetes mellitus with diabetic nephropathy: Secondary | ICD-10-CM | POA: Diagnosis not present

## 2023-07-08 DIAGNOSIS — N186 End stage renal disease: Secondary | ICD-10-CM | POA: Diagnosis not present

## 2023-07-08 DIAGNOSIS — E1122 Type 2 diabetes mellitus with diabetic chronic kidney disease: Secondary | ICD-10-CM | POA: Diagnosis not present

## 2023-07-08 DIAGNOSIS — E1142 Type 2 diabetes mellitus with diabetic polyneuropathy: Secondary | ICD-10-CM | POA: Diagnosis not present

## 2023-07-08 DIAGNOSIS — Z794 Long term (current) use of insulin: Secondary | ICD-10-CM | POA: Diagnosis not present

## 2023-07-08 DIAGNOSIS — Z992 Dependence on renal dialysis: Secondary | ICD-10-CM | POA: Diagnosis not present

## 2023-07-10 DIAGNOSIS — E7800 Pure hypercholesterolemia, unspecified: Secondary | ICD-10-CM | POA: Diagnosis not present

## 2023-07-10 DIAGNOSIS — G4733 Obstructive sleep apnea (adult) (pediatric): Secondary | ICD-10-CM | POA: Diagnosis not present

## 2023-07-10 DIAGNOSIS — I700 Atherosclerosis of aorta: Secondary | ICD-10-CM | POA: Diagnosis not present

## 2023-07-10 DIAGNOSIS — I10 Essential (primary) hypertension: Secondary | ICD-10-CM | POA: Diagnosis not present

## 2023-07-10 DIAGNOSIS — R600 Localized edema: Secondary | ICD-10-CM | POA: Diagnosis not present

## 2023-07-10 DIAGNOSIS — Z794 Long term (current) use of insulin: Secondary | ICD-10-CM | POA: Diagnosis not present

## 2023-07-10 DIAGNOSIS — I5032 Chronic diastolic (congestive) heart failure: Secondary | ICD-10-CM | POA: Diagnosis not present

## 2023-07-10 DIAGNOSIS — I2510 Atherosclerotic heart disease of native coronary artery without angina pectoris: Secondary | ICD-10-CM | POA: Diagnosis not present

## 2023-07-10 DIAGNOSIS — E1122 Type 2 diabetes mellitus with diabetic chronic kidney disease: Secondary | ICD-10-CM | POA: Diagnosis not present

## 2023-07-10 DIAGNOSIS — Z992 Dependence on renal dialysis: Secondary | ICD-10-CM | POA: Diagnosis not present

## 2023-07-10 DIAGNOSIS — N186 End stage renal disease: Secondary | ICD-10-CM | POA: Diagnosis not present

## 2023-07-10 DIAGNOSIS — N184 Chronic kidney disease, stage 4 (severe): Secondary | ICD-10-CM | POA: Diagnosis not present

## 2023-07-11 DIAGNOSIS — E1121 Type 2 diabetes mellitus with diabetic nephropathy: Secondary | ICD-10-CM | POA: Diagnosis not present

## 2023-07-22 DIAGNOSIS — N186 End stage renal disease: Secondary | ICD-10-CM | POA: Diagnosis not present

## 2023-07-22 DIAGNOSIS — Z992 Dependence on renal dialysis: Secondary | ICD-10-CM | POA: Diagnosis not present

## 2023-07-23 DIAGNOSIS — Z992 Dependence on renal dialysis: Secondary | ICD-10-CM | POA: Diagnosis not present

## 2023-07-23 DIAGNOSIS — N186 End stage renal disease: Secondary | ICD-10-CM | POA: Diagnosis not present

## 2023-07-23 DIAGNOSIS — D509 Iron deficiency anemia, unspecified: Secondary | ICD-10-CM | POA: Diagnosis not present

## 2023-07-24 DIAGNOSIS — M3501 Sicca syndrome with keratoconjunctivitis: Secondary | ICD-10-CM | POA: Diagnosis not present

## 2023-07-24 DIAGNOSIS — Z961 Presence of intraocular lens: Secondary | ICD-10-CM | POA: Diagnosis not present

## 2023-07-24 DIAGNOSIS — E1165 Type 2 diabetes mellitus with hyperglycemia: Secondary | ICD-10-CM | POA: Diagnosis not present

## 2023-07-24 DIAGNOSIS — H43813 Vitreous degeneration, bilateral: Secondary | ICD-10-CM | POA: Diagnosis not present

## 2023-08-05 ENCOUNTER — Encounter (INDEPENDENT_AMBULATORY_CARE_PROVIDER_SITE_OTHER): Payer: Self-pay | Admitting: Vascular Surgery

## 2023-08-05 ENCOUNTER — Ambulatory Visit (INDEPENDENT_AMBULATORY_CARE_PROVIDER_SITE_OTHER): Payer: PPO | Admitting: Vascular Surgery

## 2023-08-05 VITALS — BP 132/70 | HR 68 | Resp 18 | Wt 303.4 lb

## 2023-08-05 DIAGNOSIS — Z794 Long term (current) use of insulin: Secondary | ICD-10-CM

## 2023-08-05 DIAGNOSIS — N186 End stage renal disease: Secondary | ICD-10-CM | POA: Diagnosis not present

## 2023-08-05 DIAGNOSIS — M7989 Other specified soft tissue disorders: Secondary | ICD-10-CM

## 2023-08-05 DIAGNOSIS — E785 Hyperlipidemia, unspecified: Secondary | ICD-10-CM | POA: Diagnosis not present

## 2023-08-05 DIAGNOSIS — E1122 Type 2 diabetes mellitus with diabetic chronic kidney disease: Secondary | ICD-10-CM

## 2023-08-05 DIAGNOSIS — Z992 Dependence on renal dialysis: Secondary | ICD-10-CM

## 2023-08-05 NOTE — Assessment & Plan Note (Signed)
 I had a long discussion with he and his wife again today.  We have followed him for some time and know them fairly well.  I am concerned that if we put in an AV fistula, it would likely not mature or have a high risk of thrombosis due to his hypotension.  He is a relatively high risk for anesthesia and procedure given his extensive comorbidities, and I am hesitant to do his fistula due to the concerns for patency.  His anatomy is suitable, but I am not sure his blood pressure will support it.  They are trying to get his blood pressure regulated with midodrine and adjusting his dialysis treatments as needed.  I will send a message to his nephrologist, but for now I plan to hold off on AV fistula creation and reassess him in a few months.

## 2023-08-05 NOTE — Progress Notes (Signed)
 MRN : [id number]  James Clarke is a 83 y.o. (04/16/1941) male who presents with chief complaint of  Chief Complaint  Patient presents with   Follow-up    2 month follow up  .  History of Present Illness: Patient returns today in follow up of his dialysis access.  He had a left radiocephalic AV fistula that ultimately failed and it sounds like in large part this was due to chronic hypotension particularly with dialysis.  He is now taking midodrine prior to dialysis and at the end of dialysis but he is still having issues with low blood pressure and lightheadedness.  It sounds like he had a fall in his bathroom recently due to low blood pressure.  His PermCath is currently working well.  He has known good anatomy for a left brachiocephalic AV fistula but we have held on doing this due to his hypotension.  Current Outpatient Medications  Medication Sig Dispense Refill   acetaminophen  (TYLENOL ) 500 MG tablet Take 1,000 mg by mouth every 6 (six) hours as needed (shoulder pain).     albuterol  (PROVENTIL  HFA;VENTOLIN  HFA) 108 (90 Base) MCG/ACT inhaler Inhale 2 puffs into the lungs every 6 (six) hours as needed for wheezing or shortness of breath.     aspirin  EC 81 MG tablet Take by mouth.     calcitRIOL  (ROCALTROL ) 0.25 MCG capsule Take 0.25 mcg by mouth daily.     cetirizine (ZYRTEC) 10 MG chewable tablet Chew 10 mg by mouth daily as needed for allergies.     Continuous Blood Gluc Sensor (FREESTYLE LIBRE 14 DAY SENSOR) MISC by Does not apply route.     diazepam  (VALIUM ) 2 MG tablet Take 2 mg by mouth every 12 (twelve) hours as needed.     fluticasone  (FLONASE ) 50 MCG/ACT nasal spray as needed.     furosemide  (LASIX ) 40 MG tablet Take 2 tablets (80 mg total) by mouth 2 (two) times daily. On non dialysis days, Sunday, Tuesday, Thursday, Saturday and Sunday 120 tablet 0   GLOBAL INJECT EASE INSULIN  SYR 31G X 5/16 0.5 ML MISC Inject into the skin.     glucose blood (ONETOUCH ULTRA) test strip  TEST BLOOD SUGAR 4 TIMES DAILY     insulin  aspart (NOVOLOG ) 100 UNIT/ML injection Inject 14 Units into the skin 3 (three) times daily with meals. (Patient taking differently: Inject 35-45 Units into the skin 3 (three) times daily with meals. 45 units breakfast 35 units lunch 45 units supper) 10 mL 11   insulin  detemir (LEVEMIR ) 100 UNIT/ML injection Inject 0.5 mLs (50 Units total) into the skin at bedtime. (Patient taking differently: Inject 35 Units into the skin at bedtime.) 10 mL 1   isosorbide  mononitrate (IMDUR ) 30 MG 24 hr tablet Take 1 tablet (30 mg total) by mouth daily. 30 tablet 2   Lancets (ONETOUCH DELICA PLUS LANCET30G) MISC USE 1 LANCET 4 TIMES DAILY     lovastatin (MEVACOR) 40 MG tablet Take 40 mg by mouth daily with supper.     metoprolol  succinate (TOPROL -XL) 50 MG 24 hr tablet Take 2 tablets (100 mg total) by mouth daily. Do not take on Monday, Wednesday and Friday on dialysis days 120 tablet 0   midodrine (PROAMATINE) 10 MG tablet Take 10 mg by mouth 3 (three) times a week. On dialysis days and PRN on non-dialysis days     nitroGLYCERIN  (NITROSTAT ) 0.4 MG SL tablet Place 1 tablet (0.4 mg total) under the tongue every 5 (five) minutes  as needed for chest pain. 20 tablet 1   sacubitril -valsartan  (ENTRESTO ) 24-26 MG Take 1 tablet by mouth 2 (two) times daily. 60 tablet 2   sertraline  (ZOLOFT ) 50 MG tablet Take 50 mg by mouth at bedtime.     tamsulosin  (FLOMAX ) 0.4 MG CAPS capsule TAKE (1) CAPSULE BY MOUTH EVERY DAY 30 capsule 11   valACYclovir (VALTREX) 500 MG tablet Take 500 mg by mouth daily.     No current facility-administered medications for this visit.    Past Medical History:  Diagnosis Date   (HFpEF) heart failure with preserved ejection fraction (HCC)    a.) TTE 11/26/2021: EF 45%, mod LVH, post HK, mod LAE, mild MR/TR, G2DD; b.) TTE 02/26/2022: EF 55-60%, BAE, triv MR, G2DD; c.) TTE 07/09/2022: EF 45%, post HK, LVH, mod LAE, triv TR/PR, mild MR, G1DD   Anemia of chronic  renal failure    Aortic atherosclerosis (HCC)    Asthma    Bell's palsy    BPH (benign prostatic hyperplasia)    CAD (coronary artery disease)    a.) LHC 12/31/2021: 60% mLAD, 20% pRCA, 20% dRCA, 20% o-pLCx --> med mgmt.   CKD (chronic kidney disease), stage IV (HCC)    Diabetic neuropathy (HCC)    Dyspnea on exertion    Ganglion cyst of wrist, right    a.) s/p excision 04/2011   Hepatic steatosis    History of 2019 novel coronavirus disease (COVID-19)    History of bilateral cataract extraction 2018   Hyperlipidemia    Hyperparathyroidism due to renal insufficiency (HCC)    Hypertension    Low testosterone in male    Nephrolithiasis    Obesity    OSA treated with BiPAP    Osteoarthritis    Peripheral edema    Right inguinal hernia    a.) s/p repair   Skin cancer    Type 2 diabetes mellitus with renal manifestations [facility])     Past Surgical History:  Procedure Laterality Date   A/V FISTULAGRAM Left 01/13/2023   Procedure: A/V Fistulagram;  Surgeon: Marea Selinda RAMAN, MD;  Location: [facility];  Service: Cardiovascular;  Laterality: Left;   A/V SHUNT INTERVENTION Left 04/11/2023   Procedure: A/V SHUNT INTERVENTION;  Surgeon: Marea Selinda RAMAN, MD;  Location: [facility];  Service: Cardiovascular;  Laterality: Left;   APPENDECTOMY     AV FISTULA PLACEMENT Left 09/19/2022   Procedure: ARTERIOVENOUS (AV) FISTULA CREATION (RADIO CEPHALIC);  Surgeon: Marea Selinda RAMAN, MD;  Location: [facility];  Service: Vascular;  Laterality: Left;   CATARACT EXTRACTION W/PHACO Left 05/20/2017   Procedure: CATARACT EXTRACTION PHACO AND INTRAOCULAR LENS PLACEMENT (IOC);  Surgeon: Jaye Fallow, MD;  Location: [facility];  Service: Ophthalmology;  Laterality: Left;  US  00:32.0 AP% 15.5 CDE 4.97 Fluid Pack Lot # [id number]   CATARACT EXTRACTION W/PHACO Right 06/10/2017   Procedure: CATARACT EXTRACTION PHACO AND INTRAOCULAR LENS PLACEMENT (IOC);  Surgeon: Jaye Fallow, MD;  Location: [facility];   Service: Ophthalmology;  Laterality: Right;  US   00:51 AP% 15.4 CDE 7.95 Fluid pack lot # [id number]   CHOLECYSTECTOMY     COLONOSCOPY     x3   CYSTOSCOPY W/ RETROGRADES Right 08/13/2019   Procedure: CYSTOSCOPY WITH RETROGRADE PYELOGRAM;  Surgeon: Francisca Redell BROCKS, MD;  Location: [facility];  Service: Urology;  Laterality: Right;   CYSTOSCOPY WITH STENT PLACEMENT Right 07/26/2019   Procedure: CYSTOSCOPY WITH STENT PLACEMENT;  Surgeon: Carolee Sherwood JONETTA DOUGLAS, MD;  Location: [facility];  Service: Urology;  Laterality: Right;   CYSTOSCOPY/URETEROSCOPY/HOLMIUM LASER/STENT PLACEMENT Right 08/13/2019   Procedure: CYSTOSCOPY/URETEROSCOPY/HOLMIUM LASER/STENT EXCHANGE;  Surgeon: Francisca Redell BROCKS, MD;  Location: [facility];  Service: Urology;  Laterality: Right;   CYSTOSCOPY/URETEROSCOPY/HOLMIUM LASER/STENT PLACEMENT Left 02/27/2022   Procedure: CYSTOSCOPY/URETEROSCOPY/HOLMIUM LASER/STENT PLACEMENT;  Surgeon: Twylla Glendia BROCKS, MD;  Location: [facility];  Service: Urology;  Laterality: Left;   DIALYSIS/PERMA CATHETER INSERTION N/A 04/11/2023   Procedure: DIALYSIS/PERMA CATHETER INSERTION;  Surgeon: Marea Selinda RAMAN, MD;  Location: [facility];  Service: Cardiovascular;  Laterality: N/A;   GANGLION CYST EXCISION Right    INGUINAL HERNIA REPAIR Right    LEFT HEART CATH AND CORONARY ANGIOGRAPHY N/A 12/31/2021   Procedure: LEFT HEART CATH AND CORONARY ANGIOGRAPHY;  Surgeon: Hester Wolm PARAS, MD;  Location: [facility];  Service: Cardiovascular;  Laterality: N/A;   SHOULDER ARTHROSCOPY WITH SUBACROMIAL DECOMPRESSION AND OPEN ROTATOR C Right 09/11/2020   Procedure: Right shoulder arthroscopic rotator cuff repair vs Regeneten patch application, subacromial decompression, and biceps tenodesis - Krystal Doyne to Assist;  Surgeon: Tobie Priest, MD;  Location: [facility];  Service: Orthopedics;  Laterality: Right;   TEMPORARY DIALYSIS CATHETER N/A 03/05/2022   Procedure: TEMPORARY DIALYSIS CATHETER;  Surgeon: Jama Cordella MATSU, MD;  Location: [facility];  Service: Cardiovascular;  Laterality: N/A;   TONSILLECTOMY     URETEROSCOPY WITH HOLMIUM LASER LITHOTRIPSY Left 02/27/2022   Procedure: URETEROSCOPY WITH HOLMIUM LASER LITHOTRIPSY;  Surgeon: Twylla Glendia BROCKS, MD;  Location: [facility];  Service: Urology;  Laterality: Left;   WRIST FRACTURE SURGERY Right      Social History   Tobacco Use   Smoking status: Never    Passive exposure: Never   Smokeless tobacco: Never  Vaping Use   Vaping status: Never Used  Substance Use Topics   Alcohol use: No   Drug use: Not Currently      Family History  Problem Relation Age of Onset   Emphysema Mother    COPD Mother    Heart disease Mother    Brain cancer Father      Allergies  Allergen Reactions   Codeine Nausea And Vomiting   Doxycycline    Erythromycin Rash     REVIEW OF SYSTEMS (Negative unless checked)   Constitutional: [] Weight loss  [] Fever  [] Chills Cardiac: [] Chest pain   [] Chest pressure   [] Palpitations   [] Shortness of breath when laying flat   [x] Shortness of breath at rest   [x] Shortness of breath with exertion. Vascular:  [] Pain in legs with walking   [] Pain in legs at rest   [] Pain in legs when laying flat   [] Claudication   [] Pain in feet when walking  [] Pain in feet at rest  [] Pain in feet when laying flat   [] History of DVT   [] Phlebitis   [] Swelling in legs   [] Varicose veins   [] Non-healing ulcers Pulmonary:   [] Uses home oxygen   [] Productive cough   [] Hemoptysis   [] Wheeze  [] COPD   [] Asthma Neurologic:  [x] Dizziness  [x] Blackouts   [] Seizures   [] History of stroke   [] History of TIA  [] Aphasia   [] Temporary blindness   [] Dysphagia   [] Weakness or numbness in arms   [] Weakness or numbness in legs Musculoskeletal:  [] Arthritis   [] Joint swelling   [x] Joint pain   [] Low back pain Hematologic:  [] Easy bruising  [] Easy bleeding   [] Hypercoagulable state   [x] Anemic   Gastrointestinal:  [] Blood in stool   [] Vomiting  blood  [] Gastroesophageal reflux/heartburn   [] Abdominal pain Genitourinary:  [x] Chronic kidney disease   [] Difficult urination  [] Frequent urination  [] Burning with urination   [] Hematuria Skin:  [] Rashes   [] Ulcers   [] Wounds Psychological:  [] History of anxiety   []  History of major depression.  Physical Examination  BP 132/70   Pulse 68   Resp 18   Wt (!) 303 lb 6.4 oz (137.6 kg)   BMI 43.53 kg/m  Gen:  WD/WN, NAD Head: [place]/AT, No temporalis wasting. Ear/Nose/Throat: Hearing grossly intact, nares w/o erythema or drainage Eyes: Conjunctiva clear. Sclera non-icteric Neck: Supple.  Trachea midline Pulmonary:  Good air movement, no use of accessory muscles.  Cardiac: RRR, no JVD Vascular: left radiocephalic fistula with no thrill or bruit. Vessel Right Left  Radial Palpable Palpable           Musculoskeletal: M/S 5/5 throughout.  No deformity or atrophy. Mild LE edema. Neurologic: Sensation grossly intact in extremities.  Symmetrical.  Speech is fluent.  Psychiatric: Judgment intact, Mood & affect appropriate for pt's clinical situation. Dermatologic: No rashes or ulcers noted.  No cellulitis or open wounds.      Labs Recent Results (from the past 2160 hours)  Brain natriuretic peptide     Status: Abnormal   Collection Time: 05/26/23  7:49 AM  Result Value Ref Range   B Natriuretic Peptide 1,047.2 (H) 0.0 - 100.0 pg/mL    Comment: Performed at [facility], [address] [place], [place] [address]  Comprehensive metabolic panel     Status: Abnormal   Collection Time: 05/26/23  7:49 AM  Result Value Ref Range   Sodium 137 135 - 145 mmol/L   Potassium 3.8 3.5 - 5.1 mmol/L   Chloride 100 98 - 111 mmol/L   CO2 25 22 - 32 mmol/L   Glucose, Bld 220 (H) 70 - 99 mg/dL    Comment: Glucose reference range applies only to samples taken after fasting for at least 8 hours.   BUN 47 (H) 8 - 23 mg/dL   Creatinine, Ser 6.64 (H) 0.61 - 1.24 mg/dL   Calcium 8.3 (L) 8.9 -  10.3 mg/dL   Total Protein 6.7 6.5 - 8.1 g/dL   Albumin 2.9 (L) 3.5 - 5.0 g/dL   AST 20 15 - 41 U/L   ALT 18 0 - 44 U/L   Alkaline Phosphatase 74 38 - 126 U/L   Total Bilirubin 0.5 <1.2 mg/dL   GFR, Estimated 18 (L) >60 mL/min    Comment: (NOTE) Calculated using the CKD-EPI Creatinine Equation (2021)    Anion gap 12 5 - 15    Comment: Performed at [facility], [address] [place], [place] [address]  Lipase, blood     Status: None   Collection Time: 05/26/23  7:49 AM  Result Value Ref Range   Lipase 41 11 - 51 U/L    Comment: Performed at [facility], [address] [place], [place] [address]  Troponin I (High Sensitivity)     Status: Abnormal   Collection Time: 05/26/23  7:49 AM  Result Value Ref Range   Troponin I (High Sensitivity) 316 (HH) <18 ng/L    Comment: CRITICAL RESULT CALLED TO, READ BACK BY AND VERIFIED WITH BILLIE MONTEE 05/26/23 0843 MW (NOTE) Elevated high sensitivity troponin I (hsTnI) values and significant  changes across serial measurements may suggest ACS but many other  chronic and acute conditions are known to elevate hsTnI results.  Refer to  the Links section for chest pain algorithms and additional  guidance. Performed at [facility], [address] [place], [place] [address]   CBC with Differential     Status: Abnormal   Collection Time: 05/26/23  7:49 AM  Result Value Ref Range   WBC 13.1 (H) 4.0 - 10.5 K/uL   RBC 3.64 (L) 4.22 - 5.81 MIL/uL   Hemoglobin 10.9 (L) 13.0 - 17.0 g/dL   HCT 66.5 (L) 60.9 - 47.9 %   MCV 91.8 80.0 - 100.0 fL   MCH 29.9 26.0 - 34.0 pg   MCHC 32.6 30.0 - 36.0 g/dL   RDW 85.2 88.4 - 84.4 %   Platelets 294 150 - 400 K/uL   nRBC 0.0 0.0 - 0.2 %   Neutrophils Relative % 78 %   Neutro Abs 10.3 (H) 1.7 - 7.7 K/uL   Lymphocytes Relative 11 %   Lymphs Abs 1.4 0.7 - 4.0 K/uL   Monocytes Relative 7 %   Monocytes Absolute 0.9 0.1 - 1.0 K/uL   Eosinophils Relative 2 %   Eosinophils  Absolute 0.3 0.0 - 0.5 K/uL   Basophils Relative 1 %   Basophils Absolute 0.1 0.0 - 0.1 K/uL   Immature Granulocytes 1 %   Abs Immature Granulocytes 0.09 (H) 0.00 - 0.07 K/uL    Comment: Performed at [facility], [address] [place], [place] [address]  Protime-INR     Status: None   Collection Time: 05/26/23  8:07 AM  Result Value Ref Range   Prothrombin Time 13.7 11.4 - 15.2 seconds   INR 1.0 0.8 - 1.2    Comment: (NOTE) INR goal varies based on device and disease states. Performed at [facility], [address] [place], [place] [address]   APTT     Status: None   Collection Time: 05/26/23  8:07 AM  Result Value Ref Range   aPTT 32 24 - 36 seconds    Comment: Performed at [facility], [address] [place], [place] [address]  Hemoglobin A1c     Status: Abnormal   Collection Time: 05/26/23  8:07 AM  Result Value Ref Range   Hgb A1c MFr Bld 7.6 (H) 4.8 - 5.6 %    Comment: (NOTE) Pre diabetes:          5.7%-6.4%  Diabetes:              >6.4%  Glycemic control for   <7.0% adults with diabetes    Mean Plasma Glucose 171.42 mg/dL    Comment: Performed at [facility], [address], [place], [place] [address]  Blood gas, venous     Status: Abnormal   Collection Time: 05/26/23  8:30 AM  Result Value Ref Range   pH, Ven 7.39 7.25 - 7.43   pCO2, Ven 49 44 - 60 mmHg   Bicarbonate 29.7 (H) 20.0 - 28.0 mmol/L   Acid-Base Excess 3.7 (H) 0.0 - 2.0 mmol/L   O2 Saturation 44.4 %   Patient temperature 37.0    Collection site VEIN     Comment: Performed at [facility], [address] [place], [place] [address]  Troponin I (High Sensitivity)     Status: Abnormal   Collection Time: 05/26/23  9:50 AM  Result Value Ref Range   Troponin I (High Sensitivity) 353 (HH) <18 ng/L    Comment: CRITICAL VALUE NOTED. VALUE IS CONSISTENT WITH PREVIOUSLY REPORTED/CALLED VALUE MW (NOTE) Elevated high sensitivity troponin  I (hsTnI) values  and significant  changes across serial measurements may suggest ACS but many other  chronic and acute conditions are known to elevate hsTnI results.  Refer to the Links section for chest pain algorithms and additional  guidance. Performed at [facility], [address] [place], [place] [address]   Glucose, capillary     Status: Abnormal   Collection Time: 05/26/23 12:45 PM  Result Value Ref Range   Glucose-Capillary 208 (H) 70 - 99 mg/dL    Comment: Glucose reference range applies only to samples taken after fasting for at least 8 hours.  MRSA Next Gen by PCR, Nasal     Status: Abnormal   Collection Time: 05/26/23  1:35 PM   Specimen: Nasal Mucosa; Nasal Swab  Result Value Ref Range   MRSA by PCR Next Gen DETECTED (A) NOT DETECTED    Comment: RESULT CALLED TO, READ BACK BY AND VERIFIED WITH:  PRAWEENA BHUSAL 05/26/2023 1748 CP (NOTE) The GeneXpert MRSA Assay (FDA approved for NASAL specimens only), is one component of a comprehensive MRSA colonization surveillance program. It is not intended to diagnose MRSA infection nor to guide or monitor treatment for MRSA infections. Test performance is not FDA approved in patients less than 84 years old. Performed at [facility], [address] [place], [place] [address]   ECHOCARDIOGRAM COMPLETE     Status: None   Collection Time: 05/26/23  2:45 PM  Result Value Ref Range   Weight 4,924.8 oz   Height 70 in   BP 170/93 mmHg   Ao pk vel 1.47 m/s   AV Area VTI 1.95 cm2   AR max vel 2.40 cm2   AV Mean grad 3.0 mmHg   AV Peak grad 8.6 mmHg   S' Lateral 5.20 cm   AV Area mean vel 2.78 cm2   Area-P 1/2 2.91 cm2   MV VTI 2.65 cm2   Est EF 30 - 35%   Troponin I (High Sensitivity)     Status: Abnormal   Collection Time: 05/26/23  3:25 PM  Result Value Ref Range   Troponin I (High Sensitivity) 462 (HH) <18 ng/L    Comment: CRITICAL VALUE NOTED. VALUE IS CONSISTENT WITH PREVIOUSLY REPORTED/CALLED VALUE  MU (NOTE) Elevated high sensitivity troponin I (hsTnI) values and significant  changes across serial measurements may suggest ACS but many other  chronic and acute conditions are known to elevate hsTnI results.  Refer to the Links section for chest pain algorithms and additional  guidance. Performed at [facility], [address] [place], [place] [address]   Glucose, capillary     Status: Abnormal   Collection Time: 05/26/23  4:01 PM  Result Value Ref Range   Glucose-Capillary 197 (H) 70 - 99 mg/dL    Comment: Glucose reference range applies only to samples taken after fasting for at least 8 hours.  Heparin  level (unfractionated)     Status: Abnormal   Collection Time: 05/26/23  6:26 PM  Result Value Ref Range   Heparin  Unfractionated 0.19 (L) 0.30 - 0.70 IU/mL    Comment: (NOTE) The clinical reportable range upper limit is being lowered to >1.10 to align with the FDA approved guidance for the current laboratory assay.  If heparin  results are below expected values, and patient dosage has  been confirmed, suggest follow up testing of antithrombin III levels. Performed at [facility], [address], [place], [place] [address]   Glucose, capillary  Status: Abnormal   Collection Time: 05/26/23  9:24 PM  Result Value Ref Range   Glucose-Capillary 204 (H) 70 - 99 mg/dL    Comment: Glucose reference range applies only to samples taken after fasting for at least 8 hours.  Basic metabolic panel     Status: Abnormal   Collection Time: 05/27/23  4:16 AM  Result Value Ref Range   Sodium 139 135 - 145 mmol/L   Potassium 3.6 3.5 - 5.1 mmol/L   Chloride 101 98 - 111 mmol/L   CO2 27 22 - 32 mmol/L   Glucose, Bld 154 (H) 70 - 99 mg/dL    Comment: Glucose reference range applies only to samples taken after fasting for at least 8 hours.   BUN 53 (H) 8 - 23 mg/dL   Creatinine, Ser 6.33 (H) 0.61 - 1.24 mg/dL   Calcium 8.3 (L) 8.9 - 10.3 mg/dL   GFR, Estimated 16  (L) >60 mL/min    Comment: (NOTE) Calculated using the CKD-EPI Creatinine Equation (2021)    Anion gap 11 5 - 15    Comment: Performed at [facility], [address] [place], [place] [address]  CBC     Status: Abnormal   Collection Time: 05/27/23  4:16 AM  Result Value Ref Range   WBC 10.9 (H) 4.0 - 10.5 K/uL   RBC 3.46 (L) 4.22 - 5.81 MIL/uL   Hemoglobin 10.4 (L) 13.0 - 17.0 g/dL   HCT 67.7 (L) 60.9 - 47.9 %   MCV 93.1 80.0 - 100.0 fL   MCH 30.1 26.0 - 34.0 pg   MCHC 32.3 30.0 - 36.0 g/dL   RDW 85.1 88.4 - 84.4 %   Platelets 288 150 - 400 K/uL   nRBC 0.0 0.0 - 0.2 %    Comment: Performed at [facility], [address] [place], [place] [address]  Heparin  level (unfractionated)     Status: None   Collection Time: 05/27/23  4:16 AM  Result Value Ref Range   Heparin  Unfractionated 0.45 0.30 - 0.70 IU/mL    Comment: (NOTE) The clinical reportable range upper limit is being lowered to >1.10 to align with the FDA approved guidance for the current laboratory assay.  If heparin  results are below expected values, and patient dosage has  been confirmed, suggest follow up testing of antithrombin III levels. Performed at [facility], [address] [place], [place] [address]   Glucose, capillary     Status: Abnormal   Collection Time: 05/27/23  8:16 AM  Result Value Ref Range   Glucose-Capillary 135 (H) 70 - 99 mg/dL    Comment: Glucose reference range applies only to samples taken after fasting for at least 8 hours.  Hepatitis B surface antigen     Status: None   Collection Time: 05/27/23 11:15 AM  Result Value Ref Range   Hepatitis B Surface Ag NON REACTIVE NON REACTIVE    Comment: Performed at [facility], [address], [place], [place] [address]  Hepatitis B surface antibody,quantitative     Status: Abnormal   Collection Time: 05/27/23 11:15 AM  Result Value Ref Range   Hep B S AB Quant (Post) <3.5 (L) Immunity>10 mIU/mL    Comment:  (NOTE)  Status of Immunity                     Anti-HBs Level  ------------------                     --------------  Inconsistent with Immunity                  0.0 - 10.0 Consistent with Immunity                         >10.0 Performed At: [facility] [address] [place], [place] [id number] Jennette Shorter MD Ey:[id number]   Glucose, capillary     Status: Abnormal   Collection Time: 05/27/23 11:27 AM  Result Value Ref Range   Glucose-Capillary 145 (H) 70 - 99 mg/dL    Comment: Glucose reference range applies only to samples taken after fasting for at least 8 hours.  Glucose, capillary     Status: Abnormal   Collection Time: 05/27/23  4:51 PM  Result Value Ref Range   Glucose-Capillary 116 (H) 70 - 99 mg/dL    Comment: Glucose reference range applies only to samples taken after fasting for at least 8 hours.  Glucose, capillary     Status: Abnormal   Collection Time: 05/27/23  8:36 PM  Result Value Ref Range   Glucose-Capillary 269 (H) 70 - 99 mg/dL    Comment: Glucose reference range applies only to samples taken after fasting for at least 8 hours.  Basic metabolic panel     Status: Abnormal   Collection Time: 05/28/23  4:07 AM  Result Value Ref Range   Sodium 136 135 - 145 mmol/L   Potassium 3.5 3.5 - 5.1 mmol/L   Chloride 99 98 - 111 mmol/L   CO2 28 22 - 32 mmol/L   Glucose, Bld 219 (H) 70 - 99 mg/dL    Comment: Glucose reference range applies only to samples taken after fasting for at least 8 hours.   BUN 41 (H) 8 - 23 mg/dL   Creatinine, Ser 7.25 (H) 0.61 - 1.24 mg/dL   Calcium 7.9 (L) 8.9 - 10.3 mg/dL   GFR, Estimated 22 (L) >60 mL/min    Comment: (NOTE) Calculated using the CKD-EPI Creatinine Equation (2021)    Anion gap 9 5 - 15    Comment: Performed at [facility], [address] [place], [place] [address]  Glucose, capillary     Status: Abnormal   Collection Time: 05/28/23  8:08 AM  Result Value Ref Range   Glucose-Capillary 189 (H) 70 - 99  mg/dL    Comment: Glucose reference range applies only to samples taken after fasting for at least 8 hours.  Glucose, capillary     Status: Abnormal   Collection Time: 05/28/23 11:59 AM  Result Value Ref Range   Glucose-Capillary 268 (H) 70 - 99 mg/dL    Comment: Glucose reference range applies only to samples taken after fasting for at least 8 hours.  Glucose, capillary     Status: Abnormal   Collection Time: 05/28/23  6:16 PM  Result Value Ref Range   Glucose-Capillary 135 (H) 70 - 99 mg/dL    Comment: Glucose reference range applies only to samples taken after fasting for at least 8 hours.  Glucose, capillary     Status: Abnormal   Collection Time: 05/28/23  9:10 PM  Result Value Ref Range   Glucose-Capillary 169 (H) 70 - 99 mg/dL    Comment: Glucose reference range applies only to samples taken after fasting for at least 8 hours.  Basic metabolic panel     Status: Abnormal   Collection Time: 05/29/23  3:33 AM  Result Value Ref Range   Sodium 132 (  L) 135 - 145 mmol/L   Potassium 3.7 3.5 - 5.1 mmol/L   Chloride 98 98 - 111 mmol/L   CO2 25 22 - 32 mmol/L   Glucose, Bld 200 (H) 70 - 99 mg/dL    Comment: Glucose reference range applies only to samples taken after fasting for at least 8 hours.   BUN 34 (H) 8 - 23 mg/dL   Creatinine, Ser 7.47 (H) 0.61 - 1.24 mg/dL   Calcium 8.1 (L) 8.9 - 10.3 mg/dL   GFR, Estimated 25 (L) >60 mL/min    Comment: (NOTE) Calculated using the CKD-EPI Creatinine Equation (2021)    Anion gap 9 5 - 15    Comment: Performed at [facility], [address] [place], [place] [address]  CBC with Differential/Platelet     Status: Abnormal   Collection Time: 05/29/23  4:52 AM  Result Value Ref Range   WBC 11.7 (H) 4.0 - 10.5 K/uL   RBC 4.00 (L) 4.22 - 5.81 MIL/uL   Hemoglobin 11.7 (L) 13.0 - 17.0 g/dL   HCT 63.7 (L) 60.9 - 47.9 %   MCV 90.5 80.0 - 100.0 fL   MCH 29.3 26.0 - 34.0 pg   MCHC 32.3 30.0 - 36.0 g/dL   RDW 85.4 88.4 - 84.4 %    Platelets 297 150 - 400 K/uL   nRBC 0.0 0.0 - 0.2 %   Neutrophils Relative % 64 %   Neutro Abs 7.7 1.7 - 7.7 K/uL   Lymphocytes Relative 20 %   Lymphs Abs 2.3 0.7 - 4.0 K/uL   Monocytes Relative 9 %   Monocytes Absolute 1.0 0.1 - 1.0 K/uL   Eosinophils Relative 5 %   Eosinophils Absolute 0.6 (H) 0.0 - 0.5 K/uL   Basophils Relative 1 %   Basophils Absolute 0.1 0.0 - 0.1 K/uL   Immature Granulocytes 1 %   Abs Immature Granulocytes 0.07 0.00 - 0.07 K/uL    Comment: Performed at [facility], [address] [place], [place] [address]  Glucose, capillary     Status: Abnormal   Collection Time: 05/29/23  8:09 AM  Result Value Ref Range   Glucose-Capillary 211 (H) 70 - 99 mg/dL    Comment: Glucose reference range applies only to samples taken after fasting for at least 8 hours.  Glucose, capillary     Status: Abnormal   Collection Time: 05/29/23 12:07 PM  Result Value Ref Range   Glucose-Capillary 357 (H) 70 - 99 mg/dL    Comment: Glucose reference range applies only to samples taken after fasting for at least 8 hours.  NM Myocar Multi W/Spect W/Wall Motion / EF     Status: None   Collection Time: 06/12/23 12:11 PM  Result Value Ref Range   Rest HR 58.0 bpm   Rest BP 168/75 mmHg   Exercise duration (min) 1 min   Exercise duration (sec) 0 sec   Estimated workload 1.0    Peak HR 76 bpm   Peak BP 168/75 mmHg   MPHR 138 bpm   Percent HR 55.0 %   Rest Nuclear Isotope Dose 31.5 mCi   Stress Nuclear Isotope Dose 30.8 mCi   SSS 6.0    SRS 15.0    SDS 0.0    TID 0.89    LV sys vol 61.0 mL   LV dias vol 120.0 62 - 150 mL   Nuc Stress EF 49 %   Base ST Depression (mm) 0 mm   ST  Depression (mm) 0 mm    Radiology No results found.  Assessment/Plan  ESRD on dialysis [facility]) I had a long discussion with he and his wife again today.  We have followed him for some time and know them fairly well.  I am concerned that if we put in an AV fistula, it would likely not mature or  have a high risk of thrombosis due to his hypotension.  He is a relatively high risk for anesthesia and procedure given his extensive comorbidities, and I am hesitant to do his fistula due to the concerns for patency.  His anatomy is suitable, but I am not sure his blood pressure will support it.  They are trying to get his blood pressure regulated with midodrine and adjusting his dialysis treatments as needed.  I will send a message to his nephrologist, but for now I plan to hold off on AV fistula creation and reassess him in a few months.  Type 2 diabetes mellitus with renal manifestations (HCC) blood glucose control important in reducing the progression of atherosclerotic disease. Also, involved in wound healing. On appropriate medications.     Hyperlipemia lipid control important in reducing the progression of atherosclerotic disease. Continue statin therapy     Swelling of limb Some better with dialysis  Selinda Gu, MD  08/05/2023 3:40 PM    This note was created with Dragon medical transcription system.  Any errors from dictation are purely unintentional

## 2023-08-11 DIAGNOSIS — E1121 Type 2 diabetes mellitus with diabetic nephropathy: Secondary | ICD-10-CM | POA: Diagnosis not present

## 2023-08-19 DIAGNOSIS — M25511 Pain in right shoulder: Secondary | ICD-10-CM | POA: Diagnosis not present

## 2023-08-19 DIAGNOSIS — F419 Anxiety disorder, unspecified: Secondary | ICD-10-CM | POA: Diagnosis not present

## 2023-08-19 DIAGNOSIS — I509 Heart failure, unspecified: Secondary | ICD-10-CM | POA: Diagnosis not present

## 2023-08-19 DIAGNOSIS — I10 Essential (primary) hypertension: Secondary | ICD-10-CM | POA: Diagnosis not present

## 2023-08-19 DIAGNOSIS — N2581 Secondary hyperparathyroidism of renal origin: Secondary | ICD-10-CM | POA: Diagnosis not present

## 2023-08-19 DIAGNOSIS — J309 Allergic rhinitis, unspecified: Secondary | ICD-10-CM | POA: Diagnosis not present

## 2023-08-19 DIAGNOSIS — I700 Atherosclerosis of aorta: Secondary | ICD-10-CM | POA: Diagnosis not present

## 2023-08-19 DIAGNOSIS — F321 Major depressive disorder, single episode, moderate: Secondary | ICD-10-CM | POA: Diagnosis not present

## 2023-08-19 DIAGNOSIS — E1140 Type 2 diabetes mellitus with diabetic neuropathy, unspecified: Secondary | ICD-10-CM | POA: Diagnosis not present

## 2023-08-19 DIAGNOSIS — E1122 Type 2 diabetes mellitus with diabetic chronic kidney disease: Secondary | ICD-10-CM | POA: Diagnosis not present

## 2023-08-19 DIAGNOSIS — E7800 Pure hypercholesterolemia, unspecified: Secondary | ICD-10-CM | POA: Diagnosis not present

## 2023-08-21 DIAGNOSIS — Z86018 Personal history of other benign neoplasm: Secondary | ICD-10-CM | POA: Diagnosis not present

## 2023-08-21 DIAGNOSIS — L821 Other seborrheic keratosis: Secondary | ICD-10-CM | POA: Diagnosis not present

## 2023-08-21 DIAGNOSIS — D1801 Hemangioma of skin and subcutaneous tissue: Secondary | ICD-10-CM | POA: Diagnosis not present

## 2023-08-21 DIAGNOSIS — L578 Other skin changes due to chronic exposure to nonionizing radiation: Secondary | ICD-10-CM | POA: Diagnosis not present

## 2023-08-21 DIAGNOSIS — L570 Actinic keratosis: Secondary | ICD-10-CM | POA: Diagnosis not present

## 2023-08-21 DIAGNOSIS — Z872 Personal history of diseases of the skin and subcutaneous tissue: Secondary | ICD-10-CM | POA: Diagnosis not present

## 2023-08-21 DIAGNOSIS — Q386 Other congenital malformations of mouth: Secondary | ICD-10-CM | POA: Diagnosis not present

## 2023-08-21 DIAGNOSIS — B356 Tinea cruris: Secondary | ICD-10-CM | POA: Diagnosis not present

## 2023-08-22 DIAGNOSIS — N186 End stage renal disease: Secondary | ICD-10-CM | POA: Diagnosis not present

## 2023-08-22 DIAGNOSIS — Z992 Dependence on renal dialysis: Secondary | ICD-10-CM | POA: Diagnosis not present

## 2023-08-25 DIAGNOSIS — N186 End stage renal disease: Secondary | ICD-10-CM | POA: Diagnosis not present

## 2023-08-25 DIAGNOSIS — D509 Iron deficiency anemia, unspecified: Secondary | ICD-10-CM | POA: Diagnosis not present

## 2023-08-25 DIAGNOSIS — Z992 Dependence on renal dialysis: Secondary | ICD-10-CM | POA: Diagnosis not present

## 2023-09-15 DIAGNOSIS — E1121 Type 2 diabetes mellitus with diabetic nephropathy: Secondary | ICD-10-CM | POA: Diagnosis not present

## 2023-09-19 DIAGNOSIS — Z992 Dependence on renal dialysis: Secondary | ICD-10-CM | POA: Diagnosis not present

## 2023-09-19 DIAGNOSIS — N186 End stage renal disease: Secondary | ICD-10-CM | POA: Diagnosis not present

## 2023-09-22 ENCOUNTER — Other Ambulatory Visit: Payer: Self-pay | Admitting: *Deleted

## 2023-09-22 ENCOUNTER — Encounter: Payer: Self-pay | Admitting: Urology

## 2023-09-22 DIAGNOSIS — Z992 Dependence on renal dialysis: Secondary | ICD-10-CM | POA: Diagnosis not present

## 2023-09-22 DIAGNOSIS — N186 End stage renal disease: Secondary | ICD-10-CM | POA: Diagnosis not present

## 2023-09-22 DIAGNOSIS — D509 Iron deficiency anemia, unspecified: Secondary | ICD-10-CM | POA: Diagnosis not present

## 2023-09-22 DIAGNOSIS — Z23 Encounter for immunization: Secondary | ICD-10-CM | POA: Diagnosis not present

## 2023-09-22 DIAGNOSIS — N200 Calculus of kidney: Secondary | ICD-10-CM

## 2023-09-23 DIAGNOSIS — Z794 Long term (current) use of insulin: Secondary | ICD-10-CM | POA: Diagnosis not present

## 2023-09-23 DIAGNOSIS — B351 Tinea unguium: Secondary | ICD-10-CM | POA: Diagnosis not present

## 2023-09-23 DIAGNOSIS — E1140 Type 2 diabetes mellitus with diabetic neuropathy, unspecified: Secondary | ICD-10-CM | POA: Diagnosis not present

## 2023-09-29 ENCOUNTER — Telehealth: Payer: Self-pay | Admitting: Family

## 2023-09-29 NOTE — Progress Notes (Unsigned)
 Advanced Heart Failure Clinic Note     PCP: Maudie Flakes, MD (last seen 09/24) Primary Cardiologist: Dorothyann Peng, MD (last seen 11/24) HF provider: Macario Golds, MD (last seen 11/24)  HPI:  James Clarke is a 83 y/o male with a history of asthma, IDDM, hyperlipidemia, HTN, nonobstructive CAD, CKD, bell's palsy, sleep apnea and chronic heart failure. Had Percutaneous transluminal angioplasty of the distal forearm cephalic vein with 5 mm diameter by 6 cm in length Lutonix drug-coated angioplasty balloon 01/13/23. Started dialysis 09/24.  Admitted 06/12/22 due to worsening shortness of breath. Patient states that he has had shortness of breath for several days but worse 1 day prior to his admission. He states that he slept in his recliner with his CPAP because he could not breathe laying flat. Cardiology & nephrology consults obtained. Diuretic changed. PT/OT evaluations done. Discharged after 8 days. Admitted 12/13/22 due to due to increased shortness of breath and chest pain. 2 weeks ago, he had a rapid worsening in his dyspnea on exertion and shortness of breath at rest. He has not had any increased urine output since increasing his diuretic regimen as instructed by his nephrologist. Patient was seen on 12/09/2022 by nephrology at which time he was started on IV Lasix 80 mg twice a week with oral Lasix on the rest of the days. Placed on lasix drip with transition to oral diuretics.   Admitted 05/26/23 due to worsening SOB and new onset chest pain. Developed hypotension after being on dialysis for [redacted] weeks along with symptomatic dizziness at the end of dialysis. Dialysis day meds adjusted but symptoms persisted. Night prior to admission, chest pain developed that woke him up. No improvement with SL NTG. No change in weight. CXR showing pulmonary congestion. First troponin was 300. Heparin and IV lasix given. Echo 05/26/23: EF 30-35% with moderate LVH, mild James. Nephrology/ cardiology consulted and  dialysis done.   Echo 02/26/22: EF of 55-60% along with mild LVH, LAE and trivial James. Echo 07/09/22: EF of 45% along with moderate LVH. Echo 12/17/22: EF 50-55% along with mild LAE.    Echo 05/26/23: EF 30-35% with moderate LVH, mild James  He presents today for a HF follow-up visit with a chief complaint of moderate shortness of breath with minimal exertion. Chronic in nature. Has associated fatigue, dizziness & weakness along with this. Denies chest pain, palpitations, cough, abdominal distention. Gets up a couple of times during the night to urinate and has difficulty falling back asleep. Is continuing to have issues with his BP getting low on dialysis days (M, W, F) and then high on non-dialysis days. 2 weeks ago, he felt dizzy and had syncopal event at home. Denies hitting his head. Says that these syncopal events just happen but, again, they seem to be occurring on dialysis days. He says that after he started dialysis, he became depressed and suicidal where his kids removed all guns from his home. Is now being treated with sertraline and is feeling much better without any suicidal thoughts.   On dialysis days, he doesn't take entresto, metoprolol or lasix. He also takes 10mg  midodrine on dialysis days. Thinks that he feels so bad because of the roller coaster that his BP does. His fistula in his arm collapsed (they think due to hypotension) & had to have permcath placed.   Saw the [facility] HF provider and says that he does not plan to return.   Previous cardiac studies:  LHC 12/31/21:    Mid LAD lesion  is 60% stenosed.   Prox RCA lesion is 20% stenosed.   Dist RCA lesion is 20% stenosed.   Ost Cx to Prox Cx lesion is 20% stenosed.   The left ventricular systolic function is normal.   LV end diastolic pressure is moderately elevated.   The left ventricular ejection fraction is greater than 65% by visual estimate.  Patient has significant elevated end-diastolic pressure  Stress test 06/10/23: no  ischemia, low risk study  ROS: All systems negative except as listed in HPI, PMH and Problem List.  SH:  Social History   Socioeconomic History   Marital status: Married    Spouse name: Not on file   Number of children: Not on file   Years of education: Not on file   Highest education level: Not on file  Occupational History   Not on file  Tobacco Use   Smoking status: Never    Passive exposure: Never   Smokeless tobacco: Never  Vaping Use   Vaping status: Never Used  Substance and Sexual Activity   Alcohol use: No   Drug use: Not Currently   Sexual activity: Yes    Birth control/protection: None  Other Topics Concern   Not on file  Social History Narrative   Not on file   Social Drivers of Health   Financial Resource Strain: Low Risk  (06/24/2023)   Received from [facility]   Overall Financial Resource Strain (CARDIA)    Difficulty of Paying Living Expenses: Not hard at all  Food Insecurity: No Food Insecurity (06/24/2023)   Received from [facility]   Hunger Vital Sign    Worried About Running Out of Food in the Last Year: Never true    Ran Out of Food in the Last Year: Never true  Transportation Needs: No Transportation Needs (06/24/2023)   Received from [facility] - Transportation    In the past 12 months, has lack of transportation kept you from medical appointments or from getting medications?: No    Lack of Transportation (Non-Medical): No  Physical Activity: Not on file  Stress: Not on file  Social Connections: Not on file  Intimate Partner Violence: Not At Risk (05/26/2023)   Humiliation, Afraid, Rape, and Kick questionnaire    Fear of Current or Ex-Partner: No    Emotionally Abused: No    Physically Abused: No    Sexually Abused: No    FH:  Family History  Problem Relation Age of Onset   Emphysema Mother    COPD Mother    Heart disease Mother    Brain cancer Father     Past  Medical History:  Diagnosis Date   (HFpEF) heart failure with preserved ejection fraction (HCC)    a.) TTE 11/26/2021: EF 45%, mod LVH, post HK, mod LAE, mild James/TR, G2DD; b.) TTE 02/26/2022: EF 55-60%, BAE, triv James, G2DD; c.) TTE 07/09/2022: EF 45%, post HK, LVH, mod LAE, triv TR/PR, mild James, G1DD   Anemia of chronic renal failure    Aortic atherosclerosis (HCC)    Asthma    Bell's palsy    BPH (benign prostatic hyperplasia)    CAD (coronary artery disease)    a.) LHC 12/31/2021: 60% mLAD, 20% pRCA, 20% dRCA, 20% o-pLCx --> med mgmt.   CKD (chronic kidney disease), stage IV (HCC)    Diabetic neuropathy (HCC)    Dyspnea on exertion    Ganglion cyst of wrist, right  a.) s/p excision 04/2011   Hepatic steatosis    History of 2019 novel coronavirus disease (COVID-19)    History of bilateral cataract extraction 2018   Hyperlipidemia    Hyperparathyroidism due to renal insufficiency (HCC)    Hypertension    Low testosterone in male    Nephrolithiasis    Obesity    OSA treated with BiPAP    Osteoarthritis    Peripheral edema    Right inguinal hernia    a.) s/p repair   Skin cancer    Type 2 diabetes mellitus with renal manifestations (HCC)     Current Outpatient Medications  Medication Sig Dispense Refill   acetaminophen (TYLENOL) 500 MG tablet Take 1,000 mg by mouth every 6 (six) hours as needed (shoulder pain).     albuterol (PROVENTIL HFA;VENTOLIN HFA) 108 (90 Base) MCG/ACT inhaler Inhale 2 puffs into the lungs every 6 (six) hours as needed for wheezing or shortness of breath.     aspirin EC 81 MG tablet Take by mouth.     calcitRIOL (ROCALTROL) 0.25 MCG capsule Take 0.25 mcg by mouth daily.     cetirizine (ZYRTEC) 10 MG chewable tablet Chew 10 mg by mouth daily as needed for allergies.     Continuous Blood Gluc Sensor (FREESTYLE LIBRE 14 DAY SENSOR) MISC by Does not apply route.     diazepam (VALIUM) 2 MG tablet Take 2 mg by mouth every 12 (twelve) hours as needed.      fluticasone (FLONASE) 50 MCG/ACT nasal spray as needed.     furosemide (LASIX) 40 MG tablet Take 2 tablets (80 mg total) by mouth 2 (two) times daily. On non dialysis days, Sunday, Tuesday, Thursday, Saturday and Sunday 120 tablet 0   GLOBAL INJECT EASE INSULIN SYR 31G X 5/16" 0.5 ML MISC Inject into the skin.     glucose blood (ONETOUCH ULTRA) test strip TEST BLOOD SUGAR 4 TIMES DAILY     insulin aspart (NOVOLOG) 100 UNIT/ML injection Inject 14 Units into the skin 3 (three) times daily with meals. (Patient taking differently: Inject 35-45 Units into the skin 3 (three) times daily with meals. 45 units breakfast 35 units lunch 45 units supper) 10 mL 11   insulin detemir (LEVEMIR) 100 UNIT/ML injection Inject 0.5 mLs (50 Units total) into the skin at bedtime. (Patient taking differently: Inject 35 Units into the skin at bedtime.) 10 mL 1   isosorbide mononitrate (IMDUR) 30 MG 24 hr tablet Take 1 tablet (30 mg total) by mouth daily. 30 tablet 2   Lancets (ONETOUCH DELICA PLUS LANCET30G) MISC USE 1 LANCET 4 TIMES DAILY     lovastatin (MEVACOR) 40 MG tablet Take 40 mg by mouth daily with supper.     metoprolol succinate (TOPROL-XL) 50 MG 24 hr tablet Take 2 tablets (100 mg total) by mouth daily. Do not take on Monday, Wednesday and Friday on dialysis days 120 tablet 0   midodrine (PROAMATINE) 10 MG tablet Take 10 mg by mouth 3 (three) times a week. On dialysis days and PRN on non-dialysis days     nitroGLYCERIN (NITROSTAT) 0.4 MG SL tablet Place 1 tablet (0.4 mg total) under the tongue every 5 (five) minutes as needed for chest pain. 20 tablet 1   sacubitril-valsartan (ENTRESTO) 24-26 MG Take 1 tablet by mouth 2 (two) times daily. 60 tablet 2   sertraline (ZOLOFT) 50 MG tablet Take 50 mg by mouth at bedtime.     tamsulosin (FLOMAX) 0.4 MG CAPS capsule TAKE (1)  CAPSULE BY MOUTH EVERY DAY 30 capsule 11   valACYclovir (VALTREX) 500 MG tablet Take 500 mg by mouth daily.     No current facility-administered  medications for this visit.   There were no vitals filed for this visit.  Wt Readings from Last 3 Encounters:  08/05/23 (!) 303 lb 6.4 oz (137.6 kg)  07/01/23 (!) 304 lb (137.9 kg)  06/03/23 (!) 302 lb 9.6 oz (137.3 kg)   Lab Results  Component Value Date   CREATININE 2.52 (H) 05/29/2023   CREATININE 2.74 (H) 05/28/2023   CREATININE 3.66 (H) 05/27/2023    PHYSICAL EXAM:  General:  Well appearing. No resp difficulty HEENT: normal Neck: supple. JVP flat. No lymphadenopathy or thryomegaly appreciated. Cor: PMI normal. Regular rate & rhythm. No rubs, gallops or murmurs. Lungs: clear Abdomen: soft, nontender, nondistended. No hepatosplenomegaly. No bruits or masses.  Extremities: no cyanosis, clubbing, rash, trace pitting edema bilateral lower legs Neuro: alert & oriented x3, cranial nerves grossly intact. Moves all 4 extremities w/o difficulty. Affect pleasant.    ECG: not done   ASSESSMENT & PLAN:  1: NICM with mildly reduced ejection fraction- - suspect due to HTN/ DM as cath showed nonobstructive CAD - NYHA class III - euvolemic - weighing daily; reminded to call for an overnight weight gain of > 2 pounds or a weekly weight gain of >5 pounds - weight down 11 pounds from last visit here 4 months ago; now on dialysis M, W, F - Echo 12/17/22: EF 50-55% along with mild LAE.  - Echo 07/09/22: EF of 45% along with moderate LVH.  - Echo 02/26/22: EF of 55-60% along with mild LVH, LAE and trivial James.  - Echo 05/26/23: EF 30-35% with moderate LVH, mild James - continue furosemide 80mg  BID; can take evening dose earlier in the day; currently being held on dialysis days - continue metoprolol succinate 50mg  BID except currently being held on dialysis days - continue entresto 24/26mg  BID except currently being held on dialysis days - renal function limits other GDMT therapy - not adding salt and wife reads food labels for sodium content and understands to keep daily intake to 2000mg  -  tries to keep daily fluid intake to 40 ounces - saw pulmonology James Clarke) 06/24 - saw HF provider ([facility]) 11/24; has no intention of returning - wearing compression socks daily with removal at bedtime - BNP 05/26/23 was 1047.2   2: HTN- - BP 146/63 - has midodrine 10mg  that he takes on dialysis days - saw PCP (James Clarke) 12/24 - BMP 05/29/23 reviewed and showed sodium 132, potassium 3.7, creatinine 2.52 & GFR 25  3: DM with CKD/ ESRD- - A1c 05/26/23 was 7.6% - saw nephrology James Clarke) 08/24 - saw endocrinology (James Clarke) 07/24 - saw vascular (James Clarke) 11/24 - had percutaneous transluminal angioplasty of the distal forearm cephalic vein with 5 mm diameter by 6 cm in length Lutonix drug-coated angioplasty balloon 01/13/23  4: Non-obstructive CAD- - saw cardiology (cardiology) 09/24 - LHC 12/31/21:    Mid LAD lesion is 60% stenosed.   Prox RCA lesion is 20% stenosed.   Dist RCA lesion is 20% stenosed.   Ost Cx to Prox Cx lesion is 20% stenosed.   The left ventricular systolic function is normal.   LV end diastolic pressure is moderately elevated.   The left ventricular ejection fraction is greater than 65% by visual estimate.  Patient has significant elevated end-diastolic pressure   Have asked our HF pharmacist N Wise to reach out  to patient to discuss medications to see if there's a better solution to this meds with patient being on dialysis to limit the highs and lows of his blood pressure.   Offered to make our appointments PRN and he continue with the [facility] HF clinic but he declines and says that he plans to continue coming here. Will make next appointment for 3 months, sooner if needed.      Delma Freeze, FNP 09/29/23

## 2023-09-29 NOTE — Telephone Encounter (Signed)
 Pt confirmed appt on 09/30/23

## 2023-09-30 ENCOUNTER — Encounter: Payer: Self-pay | Admitting: Family

## 2023-09-30 ENCOUNTER — Ambulatory Visit: Payer: PPO | Attending: Family | Admitting: Family

## 2023-09-30 VITALS — BP 152/75 | HR 77 | Wt 306.8 lb

## 2023-09-30 DIAGNOSIS — E1122 Type 2 diabetes mellitus with diabetic chronic kidney disease: Secondary | ICD-10-CM | POA: Diagnosis not present

## 2023-09-30 DIAGNOSIS — I132 Hypertensive heart and chronic kidney disease with heart failure and with stage 5 chronic kidney disease, or end stage renal disease: Secondary | ICD-10-CM | POA: Insufficient documentation

## 2023-09-30 DIAGNOSIS — J45909 Unspecified asthma, uncomplicated: Secondary | ICD-10-CM | POA: Diagnosis not present

## 2023-09-30 DIAGNOSIS — E785 Hyperlipidemia, unspecified: Secondary | ICD-10-CM | POA: Insufficient documentation

## 2023-09-30 DIAGNOSIS — I5032 Chronic diastolic (congestive) heart failure: Secondary | ICD-10-CM | POA: Diagnosis not present

## 2023-09-30 DIAGNOSIS — I2510 Atherosclerotic heart disease of native coronary artery without angina pectoris: Secondary | ICD-10-CM | POA: Diagnosis not present

## 2023-09-30 DIAGNOSIS — Z992 Dependence on renal dialysis: Secondary | ICD-10-CM | POA: Diagnosis not present

## 2023-09-30 DIAGNOSIS — I428 Other cardiomyopathies: Secondary | ICD-10-CM | POA: Insufficient documentation

## 2023-09-30 DIAGNOSIS — I10 Essential (primary) hypertension: Secondary | ICD-10-CM

## 2023-09-30 DIAGNOSIS — I5022 Chronic systolic (congestive) heart failure: Secondary | ICD-10-CM

## 2023-09-30 DIAGNOSIS — Z794 Long term (current) use of insulin: Secondary | ICD-10-CM | POA: Insufficient documentation

## 2023-09-30 DIAGNOSIS — Z79899 Other long term (current) drug therapy: Secondary | ICD-10-CM | POA: Diagnosis not present

## 2023-09-30 DIAGNOSIS — R0602 Shortness of breath: Secondary | ICD-10-CM | POA: Diagnosis present

## 2023-09-30 DIAGNOSIS — N186 End stage renal disease: Secondary | ICD-10-CM | POA: Insufficient documentation

## 2023-09-30 NOTE — Patient Instructions (Signed)
 Great to see you today!!!  Medication Changes:  NO changes, continue current medications!!  Special Instructions // Education:  Do the following things EVERYDAY: Weigh yourself in the morning before breakfast. Write it down and keep it in a log. Take your medicines as prescribed Eat low salt foods--Limit salt (sodium) to 2000 mg per day.  Stay as active as you can everyday Limit all fluids for the day to less than 2 liters   Follow-Up in: 5 months    If you have any questions or concerns before your next appointment please send [place] a message through mychart or call our office at [phone] Monday-Friday 8 am-5 pm.   If you have an urgent need after hours on the weekend please call your Primary Cardiologist or the Advanced [facility] Failure Clinic in [place] at [phone].   At the [facility] Clinic, you and your health needs are our priority. We have a designated team specialized in the treatment of Heart Failure. This Care Team includes your primary Heart Failure Specialized Cardiologist (physician), Advanced Practice Providers (APPs- Physician Assistants and Nurse Practitioners), and Pharmacist who all work together to provide you with the care you need, when you need it.   You may see any of the following providers on your designated Care Team at your next follow up:  Dr. Arvilla Meres Dr. Marca Ancona Dr. Dorthula Nettles Dr. Theresia Bough Tonye Becket, NP Robbie Lis, [place] [address] [place], [place] Brynda Peon, NP [place] Lee, NP Clarisa Kindred, NP Enos Fling, PharmD

## 2023-10-02 DIAGNOSIS — Z794 Long term (current) use of insulin: Secondary | ICD-10-CM | POA: Diagnosis not present

## 2023-10-02 DIAGNOSIS — N186 End stage renal disease: Secondary | ICD-10-CM | POA: Diagnosis not present

## 2023-10-02 DIAGNOSIS — I2510 Atherosclerotic heart disease of native coronary artery without angina pectoris: Secondary | ICD-10-CM | POA: Diagnosis not present

## 2023-10-02 DIAGNOSIS — I5032 Chronic diastolic (congestive) heart failure: Secondary | ICD-10-CM | POA: Diagnosis not present

## 2023-10-02 DIAGNOSIS — G4733 Obstructive sleep apnea (adult) (pediatric): Secondary | ICD-10-CM | POA: Diagnosis not present

## 2023-10-02 DIAGNOSIS — I10 Essential (primary) hypertension: Secondary | ICD-10-CM | POA: Diagnosis not present

## 2023-10-02 DIAGNOSIS — E7800 Pure hypercholesterolemia, unspecified: Secondary | ICD-10-CM | POA: Diagnosis not present

## 2023-10-02 DIAGNOSIS — N184 Chronic kidney disease, stage 4 (severe): Secondary | ICD-10-CM | POA: Diagnosis not present

## 2023-10-02 DIAGNOSIS — R600 Localized edema: Secondary | ICD-10-CM | POA: Diagnosis not present

## 2023-10-02 DIAGNOSIS — I700 Atherosclerosis of aorta: Secondary | ICD-10-CM | POA: Diagnosis not present

## 2023-10-02 DIAGNOSIS — Z992 Dependence on renal dialysis: Secondary | ICD-10-CM | POA: Diagnosis not present

## 2023-10-02 DIAGNOSIS — E1122 Type 2 diabetes mellitus with diabetic chronic kidney disease: Secondary | ICD-10-CM | POA: Diagnosis not present

## 2023-10-04 ENCOUNTER — Ambulatory Visit
Admission: RE | Admit: 2023-10-04 | Discharge: 2023-10-04 | Disposition: A | Discharge status: Home / Self Care | Payer: Self-pay | Source: Ambulatory Visit | Attending: Emergency Medicine | Admitting: Emergency Medicine

## 2023-10-04 VITALS — BP 135/66 | HR 78 | Temp 98.0°F | Ht 70.0 in | Wt 300.0 lb

## 2023-10-04 DIAGNOSIS — L02519 Cutaneous abscess of unspecified hand: Secondary | ICD-10-CM | POA: Diagnosis not present

## 2023-10-04 MED ORDER — SULFAMETHOXAZOLE-TRIMETHOPRIM 400-80 MG PO TABS: 1.0000 | ORAL_TABLET | Freq: Two times a day (BID) | ORAL | 0 refills | Status: DC

## 2023-10-04 MED ORDER — MUPIROCIN 2 % EX OINT: 1.0000 | TOPICAL_OINTMENT | Freq: Two times a day (BID) | CUTANEOUS | 0 refills | Status: DC

## 2023-10-04 MED ORDER — SULFAMETHOXAZOLE-TRIMETHOPRIM 800-160 MG PO TABS
1.0000 | ORAL_TABLET | Freq: Once | ORAL | Status: AC
  Administered 2023-10-04: 1 via ORAL

## 2023-10-04 NOTE — Discharge Instructions (Addendum)
 First dose of bactrim is given in clinic  Tonight, take another dose. Then continue twice daily until finished (5 day course)  The ointment can be applied twice daily

## 2023-10-04 NOTE — ED Provider Notes (Signed)
 [facility]    CSN: [id number] Arrival date & time: 10/04/23  1057     History   Chief Complaint Chief Complaint  Patient presents with   Insect Bite    HPI James Clarke is a 83 y.o. male.  About 4-5 days ago noticed a mosquito bite on his right palm.  He tried to squeeze it but was unable to express anything.  Reports redness and tenderness has started yesterday and worsened today. No fevers  Past Medical History:  Diagnosis Date   (HFpEF) heart failure with preserved ejection fraction (HCC)    a.) TTE 11/26/2021: EF 45%, mod LVH, post HK, mod LAE, mild MR/TR, G2DD; b.) TTE 02/26/2022: EF 55-60%, BAE, triv MR, G2DD; c.) TTE 07/09/2022: EF 45%, post HK, LVH, mod LAE, triv TR/PR, mild MR, G1DD   Anemia of chronic renal failure    Aortic atherosclerosis (HCC)    Asthma    Bell's palsy    BPH (benign prostatic hyperplasia)    CAD (coronary artery disease)    a.) LHC 12/31/2021: 60% mLAD, 20% pRCA, 20% dRCA, 20% o-pLCx --> med mgmt.   CKD (chronic kidney disease), stage IV (HCC)    Diabetic neuropathy (HCC)    Dyspnea on exertion    Ganglion cyst of wrist, right    a.) s/p excision 04/2011   Hepatic steatosis    History of 2019 novel coronavirus disease (COVID-19)    History of bilateral cataract extraction 2018   Hyperlipidemia    Hyperparathyroidism due to renal insufficiency (HCC)    Hypertension    Low testosterone in male    Nephrolithiasis    Obesity    OSA treated with BiPAP    Osteoarthritis    Peripheral edema    Right inguinal hernia    a.) s/p repair   Skin cancer    Type 2 diabetes mellitus with renal manifestations [facility])     Patient Active Problem List   Diagnosis Date Noted   CHF (congestive heart failure) (HCC) 05/26/2023   ESRD on dialysis (HCC) 04/29/2023   Hypokalemia 12/16/2022   Myocardial injury 12/14/2022   Goals of care, counseling/discussion 12/13/2022   Chronic kidney disease (CKD), stage IV (severe) (HCC) 08/23/2022    Acute on chronic diastolic CHF (congestive heart failure) (HCC) 06/12/2022   Swelling of limb 04/30/2022   Obstructive uropathy 03/10/2022   Diarrhea 03/04/2022   Skin irritation 03/01/2022   UTI (urinary tract infection) 02/28/2022   Hydronephrosis of left kidney    Flank pain 02/25/2022   Chronic diastolic CHF (congestive heart failure) (HCC) 02/25/2022   Acute renal failure superimposed on chronic kidney disease (HCC) 02/25/2022   Coronary artery disease involving native coronary artery of native heart 01/10/2022   Right rotator cuff tear arthropathy 06/25/2021   Chronic right shoulder pain 06/25/2021   Localized osteoarthritis of right shoulder 06/25/2021   Chronic pain syndrome 06/25/2021   Acute systolic CHF (congestive heart failure) (HCC) 10/03/2020   Kidney stones 02/16/2020   Anemia in chronic kidney disease 07/26/2019   Chronic kidney disease, stage III (moderate) (HCC) 07/26/2019   Edema of lower extremity 07/26/2019   Malignant essential hypertension 07/26/2019   Secondary hyperparathyroidism of renal origin (HCC) 07/26/2019   Left ureteral calculus 07/26/2019   Old complex tear of medial meniscus of right knee 10/07/2018   Primary osteoarthritis of right knee 10/07/2018   Fatty liver 04/16/2018   Aortic atherosclerosis (HCC) 04/16/2018   Obesity, Class III, BMI 40-49.9 (morbid obesity) (  HCC) 11/11/2014   Microalbuminuria 11/11/2014   Long-term insulin use (HCC) 11/11/2014   Perennial allergic rhinitis 09/12/2014   Type 2 diabetes mellitus with renal manifestations (HCC) 03/19/2014   Sleep apnea 03/19/2014   Hypertension 03/19/2014   Hyperlipemia 03/19/2014    Past Surgical History:  Procedure Laterality Date   A/V FISTULAGRAM Left 01/13/2023   Procedure: A/V Fistulagram;  Surgeon: Annice Needy, MD;  Location: [facility];  Service: Cardiovascular;  Laterality: Left;   A/V SHUNT INTERVENTION Left 04/11/2023   Procedure: A/V SHUNT INTERVENTION;  Surgeon:  Annice Needy, MD;  Location: [facility];  Service: Cardiovascular;  Laterality: Left;   APPENDECTOMY     AV FISTULA PLACEMENT Left 09/19/2022   Procedure: ARTERIOVENOUS (AV) FISTULA CREATION (RADIO CEPHALIC);  Surgeon: Annice Needy, MD;  Location: [facility];  Service: Vascular;  Laterality: Left;   CATARACT EXTRACTION W/PHACO Left 05/20/2017   Procedure: CATARACT EXTRACTION PHACO AND INTRAOCULAR LENS PLACEMENT (IOC);  Surgeon: Galen Manila, MD;  Location: [facility];  Service: Ophthalmology;  Laterality: Left;  [place] 00:32.0 AP% 15.5 CDE 4.97 Fluid Pack Lot # [id number]   CATARACT EXTRACTION W/PHACO Right 06/10/2017   Procedure: CATARACT EXTRACTION PHACO AND INTRAOCULAR LENS PLACEMENT (IOC);  Surgeon: Galen Manila, MD;  Location: [facility];  Service: Ophthalmology;  Laterality: Right;  [place]  00:51 AP% 15.4 CDE 7.95 Fluid pack lot # [id number]   CHOLECYSTECTOMY     COLONOSCOPY     x3   CYSTOSCOPY W/ RETROGRADES Right 08/13/2019   Procedure: CYSTOSCOPY WITH RETROGRADE PYELOGRAM;  Surgeon: Sondra Come, MD;  Location: [facility];  Service: Urology;  Laterality: Right;   CYSTOSCOPY WITH STENT PLACEMENT Right 07/26/2019   Procedure: CYSTOSCOPY WITH STENT PLACEMENT;  Surgeon: Crista Elliot, MD;  Location: [facility];  Service: Urology;  Laterality: Right;   CYSTOSCOPY/URETEROSCOPY/HOLMIUM LASER/STENT PLACEMENT Right 08/13/2019   Procedure: CYSTOSCOPY/URETEROSCOPY/HOLMIUM LASER/STENT EXCHANGE;  Surgeon: Sondra Come, MD;  Location: [facility];  Service: Urology;  Laterality: Right;   CYSTOSCOPY/URETEROSCOPY/HOLMIUM LASER/STENT PLACEMENT Left 02/27/2022   Procedure: CYSTOSCOPY/URETEROSCOPY/HOLMIUM LASER/STENT PLACEMENT;  Surgeon: Riki Altes, MD;  Location: [facility];  Service: Urology;  Laterality: Left;   DIALYSIS/PERMA CATHETER INSERTION N/A 04/11/2023   Procedure: DIALYSIS/PERMA CATHETER INSERTION;  Surgeon: Annice Needy, MD;  Location: [facility];  Service:  Cardiovascular;  Laterality: N/A;   GANGLION CYST EXCISION Right    INGUINAL HERNIA REPAIR Right    LEFT HEART CATH AND CORONARY ANGIOGRAPHY N/A 12/31/2021   Procedure: LEFT HEART CATH AND CORONARY ANGIOGRAPHY;  Surgeon: Lamar Blinks, MD;  Location: [facility];  Service: Cardiovascular;  Laterality: N/A;   SHOULDER ARTHROSCOPY WITH SUBACROMIAL DECOMPRESSION AND OPEN ROTATOR C Right 09/11/2020   Procedure: Right shoulder arthroscopic rotator cuff repair vs Regeneten patch application, subacromial decompression, and biceps tenodesis - Dedra Skeens to Assist;  Surgeon: Signa Kell, MD;  Location: [facility];  Service: Orthopedics;  Laterality: Right;   TEMPORARY DIALYSIS CATHETER N/A 03/05/2022   Procedure: TEMPORARY DIALYSIS CATHETER;  Surgeon: Renford Dills, MD;  Location: [facility];  Service: Cardiovascular;  Laterality: N/A;   TONSILLECTOMY     URETEROSCOPY WITH HOLMIUM LASER LITHOTRIPSY Left 02/27/2022   Procedure: URETEROSCOPY WITH HOLMIUM LASER LITHOTRIPSY;  Surgeon: Riki Altes, MD;  Location: [facility];  Service: Urology;  Laterality: Left;   WRIST FRACTURE SURGERY Right        Home Medications    Prior to Admission medications  Medication Sig Start Date End Date Taking? Authorizing Provider  acetaminophen (TYLENOL) 500 MG tablet Take 1,000 mg by mouth every 6 (six) hours as needed (shoulder pain).   Yes [provider]  albuterol (PROVENTIL HFA;VENTOLIN HFA) 108 (90 Base) MCG/ACT inhaler Inhale 2 puffs into the lungs every 6 (six) hours as needed for wheezing or shortness of breath.   Yes [provider]  aspirin EC 81 MG tablet Take by mouth.   Yes [provider]  calcitRIOL (ROCALTROL) 0.25 MCG capsule Take 0.25 mcg by mouth daily. 07/13/19  Yes [provider]  cetirizine (ZYRTEC) 10 MG chewable tablet Chew 10 mg by mouth daily as needed for allergies.   Yes [provider]  Continuous Blood Gluc  Sensor (FREESTYLE LIBRE 14 DAY SENSOR) MISC by Does not apply route.   Yes [provider]  diazepam (VALIUM) 2 MG tablet Take 2 mg by mouth every 12 (twelve) hours as needed. 04/21/23  Yes [provider]  fluticasone (FLONASE) 50 MCG/ACT nasal spray as needed. 03/17/17  Yes [provider]  furosemide (LASIX) 40 MG tablet Take 2 tablets (80 mg total) by mouth 2 (two) times daily. On non dialysis days, Sunday, Tuesday, Thursday, Saturday and Sunday 05/29/23  Yes Djan, Scarlette Calico, MD  GLOBAL INJECT EASE INSULIN SYR 31G X 5/16" 0.5 ML MISC Inject into the skin. 08/16/21  Yes [provider]  glucose blood (ONETOUCH ULTRA) test strip TEST BLOOD SUGAR 4 TIMES DAILY 10/26/18  Yes [provider]  insulin aspart (NOVOLOG) 100 UNIT/ML injection Inject 14 Units into the skin 3 (three) times daily with meals. Patient taking differently: Inject 35-45 Units into the skin 3 (three) times daily with meals. 45 units breakfast 35 units lunch 45 units supper 12/17/22  Yes Wieting, Richard, MD  insulin detemir (LEVEMIR) 100 UNIT/ML injection Inject 0.5 mLs (50 Units total) into the skin at bedtime. Patient taking differently: Inject 35 Units into the skin at bedtime. 06/20/22  Yes Enedina Finner, MD  isosorbide mononitrate (IMDUR) 30 MG 24 hr tablet Take 1 tablet (30 mg total) by mouth daily. 06/21/22  Yes Enedina Finner, MD  Lancets [facility] DELICA PLUS LANCET30G) MISC USE 1 LANCET 4 TIMES DAILY 05/04/19  Yes [provider]  lovastatin (MEVACOR) 40 MG tablet Take 40 mg by mouth daily with supper. 04/28/17  Yes [provider]  metoprolol succinate (TOPROL-XL) 50 MG 24 hr tablet Take 2 tablets (100 mg total) by mouth daily. Do not take on Monday, Wednesday and Friday on dialysis days 05/29/23  Yes Djan, Scarlette Calico, MD  midodrine (PROAMATINE) 10 MG tablet Take 10 mg by mouth 3 (three) times a week. On dialysis days and PRN on non-dialysis days   Yes [provider]   mupirocin ointment (BACTROBAN) 2 % Apply 1 Application topically 2 (two) times daily. 10/04/23  Yes Laporcha Marchesi, Lurena Joiner, PA-C  nitroGLYCERIN (NITROSTAT) 0.4 MG SL tablet Place 1 tablet (0.4 mg total) under the tongue every 5 (five) minutes as needed for chest pain. 06/20/22  Yes Enedina Finner, MD  sacubitril-valsartan (ENTRESTO) 24-26 MG Take 1 tablet by mouth 2 (two) times daily. 05/29/23  Yes Loyce Dys, MD  sertraline (ZOLOFT) 50 MG tablet Take 50 mg by mouth at bedtime. 04/21/23  Yes [provider]  sulfamethoxazole-trimethoprim (BACTRIM) 400-80 MG tablet Take 1 tablet by mouth 2 (two) times daily for 5 days. 10/04/23 10/09/23 Yes Gillie Crisci, Lurena Joiner, PA-C  tamsulosin (FLOMAX) 0.4 MG CAPS capsule TAKE (1) CAPSULE  BY MOUTH EVERY DAY 06/16/23  Yes Stoioff, Verna Czech, MD    Family History Family History  Problem Relation Age of Onset   Emphysema Mother    COPD Mother    Heart disease Mother    Brain cancer Father     Social History Social History   Tobacco Use   Smoking status: Never    Passive exposure: Never   Smokeless tobacco: Never  Vaping Use   Vaping status: Never Used  Substance Use Topics   Alcohol use: No   Drug use: Not Currently     Allergies   Codeine, Doxycycline, and Erythromycin   Review of Systems Review of Systems Per HPI  Physical Exam Triage Vital Signs ED Triage Vitals  Encounter Vitals Group     BP 10/04/23 1122 135/66     Systolic BP Percentile --      Diastolic BP Percentile --      Pulse Rate 10/04/23 1122 78     Resp --      Temp 10/04/23 1122 98 F (36.7 C)     Temp Source 10/04/23 1122 Oral     SpO2 10/04/23 1122 97 %     Weight 10/04/23 1124 300 lb (136.1 kg)     Height 10/04/23 1124 5\' 10"  (1.778 m)     Head Circumference --      Peak Flow --      Pain Score 10/04/23 1126 0     Pain Loc --      Pain Education --      Exclude from Growth Chart --    No data found.  Updated Vital Signs BP 135/66 (BP Location: Left Arm)    Pulse 78   Temp 98 F (36.7 C) (Oral)   Ht 5\' 10"  (1.778 m)   Wt 300 lb (136.1 kg)   SpO2 97%   BMI 43.05 kg/m   Physical Exam Vitals and nursing note reviewed.  Constitutional:      General: He is not in acute distress.    Appearance: He is not ill-appearing.  Cardiovascular:     Rate and Rhythm: Normal rate and regular rhythm.     Heart sounds: Normal heart sounds.  Pulmonary:     Effort: Pulmonary effort is normal.     Breath sounds: Normal breath sounds.  Skin:    Comments: Small, firm, erythematous and warm spot on the right palm.  Tender to touch  Neurological:     Mental Status: He is alert.     UC Treatments / Results  Labs (all labs ordered are listed, but only abnormal results are displayed) Labs Reviewed - No data to display  EKG  Radiology No results found.  Procedures Procedures   Medications Ordered in UC Medications  sulfamethoxazole-trimethoprim (BACTRIM DS) 800-160 MG per tablet 1 tablet (1 tablet Oral Given 10/04/23 1213)    Initial Impression / Assessment and Plan / UC Course  I have reviewed the triage vital signs and the nursing notes.  Pertinent labs & imaging results that were available during my care of the patient were reviewed by me and considered in my medical decision making (see chart for details).  Forming abscess on the right palm. Small but tender. Not able to be drained today, firm. Treat with Bactrim - renal dosing. Double strength tablet given in clinic. Then single strength BID x 5 days. Can also use mupirocin. Advised monitor, return if needed. Patient agrees to plan, no questions   Final  Clinical Impressions(s) / UC Diagnoses   Final diagnoses:  Abscess, hand     Discharge Instructions      First dose of bactrim is given in clinic  Tonight, take another dose. Then continue twice daily until finished (5 day course)  The ointment can be applied twice daily     ED Prescriptions     Medication Sig Dispense Auth.  Provider   sulfamethoxazole-trimethoprim (BACTRIM) 400-80 MG tablet Take 1 tablet by mouth 2 (two) times daily for 5 days. 10 tablet Tamila Gaulin, PA-C   mupirocin ointment (BACTROBAN) 2 % Apply 1 Application topically 2 (two) times daily. 15 g Azael Ragain, Lurena Joiner, PA-C      PDMP not reviewed this encounter.   Kathrine Haddock 10/04/23 1219

## 2023-10-04 NOTE — ED Triage Notes (Signed)
 Pt is with his wife.  Pt c/o "knot" along right palm below the thumb. Pt is unsure if it is an insect bite  Pt states that he was in bed when he noticed the bump and states that it looked like a mosquito bite.  Pt states that he has tried to pop it and it did not pop, now it is firm and tender to the touch.   Pt asks for the bump to be lanced.

## 2023-10-07 ENCOUNTER — Ambulatory Visit
Admission: RE | Admit: 2023-10-07 | Discharge: 2023-10-07 | Disposition: A | Discharge status: Home / Self Care | Attending: Urology | Admitting: Urology

## 2023-10-07 ENCOUNTER — Ambulatory Visit
Admission: RE | Admit: 2023-10-07 | Discharge: 2023-10-07 | Disposition: A | Discharge status: Home / Self Care | Source: Ambulatory Visit | Attending: Urology | Admitting: Urology

## 2023-10-07 DIAGNOSIS — Z794 Long term (current) use of insulin: Secondary | ICD-10-CM | POA: Diagnosis not present

## 2023-10-07 DIAGNOSIS — E1121 Type 2 diabetes mellitus with diabetic nephropathy: Secondary | ICD-10-CM | POA: Diagnosis not present

## 2023-10-07 DIAGNOSIS — E1142 Type 2 diabetes mellitus with diabetic polyneuropathy: Secondary | ICD-10-CM | POA: Diagnosis not present

## 2023-10-07 DIAGNOSIS — N200 Calculus of kidney: Secondary | ICD-10-CM | POA: Diagnosis not present

## 2023-10-07 DIAGNOSIS — Z992 Dependence on renal dialysis: Secondary | ICD-10-CM | POA: Diagnosis not present

## 2023-10-07 DIAGNOSIS — E1165 Type 2 diabetes mellitus with hyperglycemia: Secondary | ICD-10-CM | POA: Diagnosis not present

## 2023-10-07 DIAGNOSIS — E1122 Type 2 diabetes mellitus with diabetic chronic kidney disease: Secondary | ICD-10-CM | POA: Diagnosis not present

## 2023-10-07 DIAGNOSIS — N186 End stage renal disease: Secondary | ICD-10-CM | POA: Diagnosis not present

## 2023-10-07 DIAGNOSIS — N184 Chronic kidney disease, stage 4 (severe): Secondary | ICD-10-CM | POA: Diagnosis not present

## 2023-10-07 DIAGNOSIS — I10 Essential (primary) hypertension: Secondary | ICD-10-CM | POA: Diagnosis not present

## 2023-10-09 ENCOUNTER — Ambulatory Visit: Payer: PPO | Admitting: Urology

## 2023-10-09 VITALS — BP 133/76 | HR 70 | Ht 70.0 in | Wt 300.0 lb

## 2023-10-09 DIAGNOSIS — N200 Calculus of kidney: Secondary | ICD-10-CM | POA: Diagnosis not present

## 2023-10-09 NOTE — Progress Notes (Signed)
 I, Maysun Anabel Bene, acting as a scribe for Riki Altes, MD., have documented all relevant documentation on the behalf of Riki Altes, MD, as directed by Riki Altes, MD while in the presence of Riki Altes, MD.  10/09/2023 5:30 PM   James Clarke February 15, 1941 [id number]  Referring provider: Marina Goodell, MD [address] [place],  [place] [address]  Chief Complaint  Patient presents with   Nephrolithiasis   Urologic History: Recurrent stone disease s/p ureteroscopic removal of distal ureteral calculi x3 on 02/27/2022   HPI: James Clarke is a 83 y.o. male presents for annual follow-up.   Since last year's visit, he has been placed on hemodialysis. Denies flank, abdominal, or pelvic pain. Imaging KUB performed 10/07/23 was personally reviewed and interpreted. There are no calcifications overlying the renal outlines with respect to the ureter suspicious for urinary tract stones.    PMH: Past Medical History:  Diagnosis Date   (HFpEF) heart failure with preserved ejection fraction (HCC)    a.) TTE 11/26/2021: EF 45%, mod LVH, post HK, mod LAE, mild MR/TR, G2DD; b.) TTE 02/26/2022: EF 55-60%, BAE, triv MR, G2DD; c.) TTE 07/09/2022: EF 45%, post HK, LVH, mod LAE, triv TR/PR, mild MR, G1DD   Anemia of chronic renal failure    Aortic atherosclerosis (HCC)    Asthma    Bell's palsy    BPH (benign prostatic hyperplasia)    CAD (coronary artery disease)    a.) LHC 12/31/2021: 60% mLAD, 20% pRCA, 20% dRCA, 20% o-pLCx --> med mgmt.   CKD (chronic kidney disease), stage IV (HCC)    Diabetic neuropathy (HCC)    Dyspnea on exertion    Ganglion cyst of wrist, right    a.) s/p excision 04/2011   Hepatic steatosis    History of 2019 novel coronavirus disease (COVID-19)    History of bilateral cataract extraction 2018   Hyperlipidemia    Hyperparathyroidism due to renal insufficiency (HCC)    Hypertension    Low testosterone in male    Nephrolithiasis    Obesity     OSA treated with BiPAP    Osteoarthritis    Peripheral edema    Right inguinal hernia    a.) s/p repair   Skin cancer    Type 2 diabetes mellitus with renal manifestations [facility])     Surgical History: Past Surgical History:  Procedure Laterality Date   A/V FISTULAGRAM Left 01/13/2023   Procedure: A/V Fistulagram;  Surgeon: Annice Needy, MD;  Location: [facility];  Service: Cardiovascular;  Laterality: Left;   A/V SHUNT INTERVENTION Left 04/11/2023   Procedure: A/V SHUNT INTERVENTION;  Surgeon: Annice Needy, MD;  Location: [facility];  Service: Cardiovascular;  Laterality: Left;   APPENDECTOMY     AV FISTULA PLACEMENT Left 09/19/2022   Procedure: ARTERIOVENOUS (AV) FISTULA CREATION (RADIO CEPHALIC);  Surgeon: Annice Needy, MD;  Location: [facility];  Service: Vascular;  Laterality: Left;   CATARACT EXTRACTION W/PHACO Left 05/20/2017   Procedure: CATARACT EXTRACTION PHACO AND INTRAOCULAR LENS PLACEMENT (IOC);  Surgeon: Galen Manila, MD;  Location: [facility];  Service: Ophthalmology;  Laterality: Left;  [place] 00:32.0 AP% 15.5 CDE 4.97 Fluid Pack Lot # [id number]   CATARACT EXTRACTION W/PHACO Right 06/10/2017   Procedure: CATARACT EXTRACTION PHACO AND INTRAOCULAR LENS PLACEMENT (IOC);  Surgeon: Galen Manila, MD;  Location: [facility];  Service: Ophthalmology;  Laterality: Right;  [place]  00:51 AP% 15.4 CDE  7.95 Fluid pack lot # [id number]   CHOLECYSTECTOMY     COLONOSCOPY     x3   CYSTOSCOPY W/ RETROGRADES Right 08/13/2019   Procedure: CYSTOSCOPY WITH RETROGRADE PYELOGRAM;  Surgeon: Sondra Come, MD;  Location: [facility];  Service: Urology;  Laterality: Right;   CYSTOSCOPY WITH STENT PLACEMENT Right 07/26/2019   Procedure: CYSTOSCOPY WITH STENT PLACEMENT;  Surgeon: Crista Elliot, MD;  Location: [facility];  Service: Urology;  Laterality: Right;   CYSTOSCOPY/URETEROSCOPY/HOLMIUM LASER/STENT PLACEMENT Right 08/13/2019   Procedure: CYSTOSCOPY/URETEROSCOPY/HOLMIUM  LASER/STENT EXCHANGE;  Surgeon: Sondra Come, MD;  Location: [facility];  Service: Urology;  Laterality: Right;   CYSTOSCOPY/URETEROSCOPY/HOLMIUM LASER/STENT PLACEMENT Left 02/27/2022   Procedure: CYSTOSCOPY/URETEROSCOPY/HOLMIUM LASER/STENT PLACEMENT;  Surgeon: Riki Altes, MD;  Location: [facility];  Service: Urology;  Laterality: Left;   DIALYSIS/PERMA CATHETER INSERTION N/A 04/11/2023   Procedure: DIALYSIS/PERMA CATHETER INSERTION;  Surgeon: Annice Needy, MD;  Location: [facility];  Service: Cardiovascular;  Laterality: N/A;   GANGLION CYST EXCISION Right    INGUINAL HERNIA REPAIR Right    LEFT HEART CATH AND CORONARY ANGIOGRAPHY N/A 12/31/2021   Procedure: LEFT HEART CATH AND CORONARY ANGIOGRAPHY;  Surgeon: Lamar Blinks, MD;  Location: [facility];  Service: Cardiovascular;  Laterality: N/A;   SHOULDER ARTHROSCOPY WITH SUBACROMIAL DECOMPRESSION AND OPEN ROTATOR C Right 09/11/2020   Procedure: Right shoulder arthroscopic rotator cuff repair vs Regeneten patch application, subacromial decompression, and biceps tenodesis - Dedra Skeens to Assist;  Surgeon: Signa Kell, MD;  Location: [facility];  Service: Orthopedics;  Laterality: Right;   TEMPORARY DIALYSIS CATHETER N/A 03/05/2022   Procedure: TEMPORARY DIALYSIS CATHETER;  Surgeon: Renford Dills, MD;  Location: [facility];  Service: Cardiovascular;  Laterality: N/A;   TONSILLECTOMY     URETEROSCOPY WITH HOLMIUM LASER LITHOTRIPSY Left 02/27/2022   Procedure: URETEROSCOPY WITH HOLMIUM LASER LITHOTRIPSY;  Surgeon: Riki Altes, MD;  Location: [facility];  Service: Urology;  Laterality: Left;   WRIST FRACTURE SURGERY Right     Home Medications:  Allergies as of 10/09/2023       Reactions   Codeine Nausea And Vomiting   Doxycycline    Erythromycin Rash        Medication List        Accurate as of October 09, 2023  5:30 PM. If you have any questions, ask your nurse or doctor.          STOP  taking these medications    sulfamethoxazole-trimethoprim 400-80 MG tablet Commonly known as: Bactrim       TAKE these medications    acetaminophen 500 MG tablet Commonly known as: TYLENOL Take 1,000 mg by mouth every 6 (six) hours as needed (shoulder pain).   albuterol 108 (90 Base) MCG/ACT inhaler Commonly known as: VENTOLIN HFA Inhale 2 puffs into the lungs every 6 (six) hours as needed for wheezing or shortness of breath.   aspirin EC 81 MG tablet Take by mouth.   calcitRIOL 0.25 MCG capsule Commonly known as: ROCALTROL Take 0.25 mcg by mouth daily.   cetirizine 10 MG chewable tablet Commonly known as: ZYRTEC Chew 10 mg by mouth daily as needed for allergies.   diazepam 2 MG tablet Commonly known as: VALIUM Take 2 mg by mouth every 12 (twelve) hours as needed.   fluticasone 50 MCG/ACT nasal spray Commonly known as: FLONASE as needed.   FreeStyle Libre 14 Day Sensor Misc by Does not apply route.  furosemide 40 MG tablet Commonly known as: LASIX Take 2 tablets (80 mg total) by mouth 2 (two) times daily. On non dialysis days, Sunday, Tuesday, Thursday, Saturday and Sunday   Global Inject Ease Insulin Syr 31G X 5/16" 0.5 ML Misc Generic drug: Insulin Syringe-Needle U-100 Inject into the skin.   insulin aspart 100 UNIT/ML injection Commonly known as: novoLOG Inject 14 Units into the skin 3 (three) times daily with meals. What changed:  how much to take additional instructions   insulin detemir 100 UNIT/ML injection Commonly known as: LEVEMIR Inject 0.5 mLs (50 Units total) into the skin at bedtime. What changed: how much to take   isosorbide mononitrate 30 MG 24 hr tablet Commonly known as: IMDUR Take 1 tablet (30 mg total) by mouth daily.   lovastatin 40 MG tablet Commonly known as: MEVACOR Take 40 mg by mouth daily with supper.   metoprolol succinate 50 MG 24 hr tablet Commonly known as: TOPROL-XL Take 2 tablets (100 mg total) by mouth daily. Do  not take on Monday, Wednesday and Friday on dialysis days   midodrine 10 MG tablet Commonly known as: PROAMATINE Take 10 mg by mouth 3 (three) times a week. On dialysis days and PRN on non-dialysis days   mupirocin ointment 2 % Commonly known as: BACTROBAN Apply 1 Application topically 2 (two) times daily.   nitroGLYCERIN 0.4 MG SL tablet Commonly known as: NITROSTAT Place 1 tablet (0.4 mg total) under the tongue every 5 (five) minutes as needed for chest pain.   OneTouch Delica Plus Lancet30G Misc USE 1 LANCET 4 TIMES DAILY   OneTouch Ultra test strip Generic drug: glucose blood TEST BLOOD SUGAR 4 TIMES DAILY   sacubitril-valsartan 24-26 MG Commonly known as: ENTRESTO Take 1 tablet by mouth 2 (two) times daily.   sertraline 50 MG tablet Commonly known as: ZOLOFT Take 50 mg by mouth at bedtime.   tamsulosin 0.4 MG Caps capsule Commonly known as: FLOMAX TAKE (1) CAPSULE BY MOUTH EVERY DAY        Allergies:  Allergies  Allergen Reactions   Codeine Nausea And Vomiting   Doxycycline    Erythromycin Rash    Family History: Family History  Problem Relation Age of Onset   Emphysema Mother    COPD Mother    Heart disease Mother    Brain cancer Father     Social History:  reports that he has never smoked. He has never been exposed to tobacco smoke. He has never used smokeless tobacco. He reports that he does not currently use drugs. He reports that he does not drink alcohol.   Physical Exam: BP 133/76   Pulse 70   Ht 5\' 10"  (1.778 m)   Wt 300 lb (136.1 kg)   BMI 43.05 kg/m   Constitutional:  Alert and oriented, No acute distress. HEENT: [place] AT, moist mucus membranes.  Trachea midline, no masses. Cardiovascular: No clubbing, cyanosis, or edema. Respiratory: Normal respiratory effort, no increased work of breathing. GI: Abdomen is soft, nontender, nondistended, no abdominal masses Skin: No rashes, bruises or suspicious lesions. Neurologic: Grossly intact, no  focal deficits, moving all 4 extremities. Psychiatric: Normal mood and affect.   Assessment & Plan:    1. Recurrent nephrolithiasis KUB today negative Currently on hemodialysis Continue annual follow-up with KUB.  [facility] [address] [place], [place] [address] [phone]

## 2023-10-10 ENCOUNTER — Ambulatory Visit: Payer: PPO | Admitting: Urology

## 2023-10-10 ENCOUNTER — Encounter: Payer: Self-pay | Admitting: Urology

## 2023-10-13 DIAGNOSIS — E1121 Type 2 diabetes mellitus with diabetic nephropathy: Secondary | ICD-10-CM | POA: Diagnosis not present

## 2023-10-20 DIAGNOSIS — N186 End stage renal disease: Secondary | ICD-10-CM | POA: Diagnosis not present

## 2023-10-20 DIAGNOSIS — Z992 Dependence on renal dialysis: Secondary | ICD-10-CM | POA: Diagnosis not present

## 2023-10-22 DIAGNOSIS — Z23 Encounter for immunization: Secondary | ICD-10-CM | POA: Diagnosis not present

## 2023-10-22 DIAGNOSIS — Z992 Dependence on renal dialysis: Secondary | ICD-10-CM | POA: Diagnosis not present

## 2023-10-22 DIAGNOSIS — N186 End stage renal disease: Secondary | ICD-10-CM | POA: Diagnosis not present

## 2023-10-22 DIAGNOSIS — D509 Iron deficiency anemia, unspecified: Secondary | ICD-10-CM | POA: Diagnosis not present

## 2023-11-04 ENCOUNTER — Ambulatory Visit (INDEPENDENT_AMBULATORY_CARE_PROVIDER_SITE_OTHER): Payer: PPO | Admitting: Vascular Surgery

## 2023-11-04 ENCOUNTER — Encounter (INDEPENDENT_AMBULATORY_CARE_PROVIDER_SITE_OTHER): Payer: Self-pay | Admitting: Vascular Surgery

## 2023-11-04 VITALS — BP 124/67 | HR 79 | Resp 20 | Wt 305.8 lb

## 2023-11-04 DIAGNOSIS — Z992 Dependence on renal dialysis: Secondary | ICD-10-CM

## 2023-11-04 DIAGNOSIS — Z794 Long term (current) use of insulin: Secondary | ICD-10-CM

## 2023-11-04 DIAGNOSIS — E785 Hyperlipidemia, unspecified: Secondary | ICD-10-CM

## 2023-11-04 DIAGNOSIS — M7989 Other specified soft tissue disorders: Secondary | ICD-10-CM | POA: Diagnosis not present

## 2023-11-04 DIAGNOSIS — E1122 Type 2 diabetes mellitus with diabetic chronic kidney disease: Secondary | ICD-10-CM

## 2023-11-04 DIAGNOSIS — N186 End stage renal disease: Secondary | ICD-10-CM

## 2023-11-04 NOTE — Progress Notes (Signed)
 MRN : [id number]  James Clarke is a 83 y.o. (07-15-1941) male who presents with chief complaint of  Chief Complaint  Patient presents with   Follow-up    3 month follow up  .  History of Present Illness: Patient returns today in follow up of his dialysis access.  He continues to use his PermCath and it is working well.  He also continues to have significant hypotension and dizziness.  He requires midodrine regularly.  He has had to call the rescue squad several times for his faint spells and dizziness.  His blood pressure still tends to run in the 100-110 systolic range which is much lower than his baseline prior to dialysis.  He is quite symptomatic from this.  We have held off on a new fistula and fears that it would not mature and be supported with such a low blood pressure and it was likely to thrombose.  He is a relatively high risk patient, and we have been leery of putting him to sleep in doing a procedure with a high risk of failure.  Current Outpatient Medications  Medication Sig Dispense Refill   acetaminophen (TYLENOL) 500 MG tablet Take 1,000 mg by mouth every 6 (six) hours as needed (shoulder pain).     albuterol (PROVENTIL HFA;VENTOLIN HFA) 108 (90 Base) MCG/ACT inhaler Inhale 2 puffs into the lungs every 6 (six) hours as needed for wheezing or shortness of breath.     aspirin EC 81 MG tablet Take by mouth.     calcitRIOL (ROCALTROL) 0.25 MCG capsule Take 0.25 mcg by mouth daily.     cetirizine (ZYRTEC) 10 MG chewable tablet Chew 10 mg by mouth daily as needed for allergies.     Continuous Blood Gluc Sensor (FREESTYLE LIBRE 14 DAY SENSOR) MISC by Does not apply route.     diazepam (VALIUM) 2 MG tablet Take 2 mg by mouth every 12 (twelve) hours as needed.     fluticasone (FLONASE) 50 MCG/ACT nasal spray as needed.     furosemide (LASIX) 40 MG tablet Take 2 tablets (80 mg total) by mouth 2 (two) times daily. On non dialysis days, Sunday, Tuesday, Thursday, Saturday and Sunday  120 tablet 0   GLOBAL INJECT EASE INSULIN SYR 31G X 5/16" 0.5 ML MISC Inject into the skin.     glucose blood (ONETOUCH ULTRA) test strip TEST BLOOD SUGAR 4 TIMES DAILY     insulin aspart (NOVOLOG) 100 UNIT/ML injection Inject 14 Units into the skin 3 (three) times daily with meals. (Patient taking differently: Inject 35-45 Units into the skin 3 (three) times daily with meals. 45 units breakfast 35 units lunch 45 units supper) 10 mL 11   insulin detemir (LEVEMIR) 100 UNIT/ML injection Inject 0.5 mLs (50 Units total) into the skin at bedtime. (Patient taking differently: Inject 35 Units into the skin at bedtime.) 10 mL 1   isosorbide mononitrate (IMDUR) 30 MG 24 hr tablet Take 1 tablet (30 mg total) by mouth daily. 30 tablet 2   Lancets (ONETOUCH DELICA PLUS LANCET30G) MISC USE 1 LANCET 4 TIMES DAILY     lovastatin (MEVACOR) 40 MG tablet Take 40 mg by mouth daily with supper.     metoprolol succinate (TOPROL-XL) 50 MG 24 hr tablet Take 2 tablets (100 mg total) by mouth daily. Do not take on Monday, Wednesday and Friday on dialysis days 120 tablet 0   midodrine (PROAMATINE) 10 MG tablet Take 10 mg by mouth 3 (three) times a  week. On dialysis days and PRN on non-dialysis days     nitroGLYCERIN (NITROSTAT) 0.4 MG SL tablet Place 1 tablet (0.4 mg total) under the tongue every 5 (five) minutes as needed for chest pain. 20 tablet 1   sacubitril-valsartan (ENTRESTO) 24-26 MG Take 1 tablet by mouth 2 (two) times daily. 60 tablet 2   sertraline (ZOLOFT) 50 MG tablet Take 50 mg by mouth at bedtime.     tamsulosin (FLOMAX) 0.4 MG CAPS capsule TAKE (1) CAPSULE BY MOUTH EVERY DAY 30 capsule 11   mupirocin ointment (BACTROBAN) 2 % Apply 1 Application topically 2 (two) times daily. 15 g 0   No current facility-administered medications for this visit.    Past Medical History:  Diagnosis Date   (HFpEF) heart failure with preserved ejection fraction (HCC)    a.) TTE 11/26/2021: EF 45%, mod LVH, post HK, mod LAE,  mild MR/TR, G2DD; b.) TTE 02/26/2022: EF 55-60%, BAE, triv MR, G2DD; c.) TTE 07/09/2022: EF 45%, post HK, LVH, mod LAE, triv TR/PR, mild MR, G1DD   Anemia of chronic renal failure    Aortic atherosclerosis (HCC)    Asthma    Bell's palsy    BPH (benign prostatic hyperplasia)    CAD (coronary artery disease)    a.) LHC 12/31/2021: 60% mLAD, 20% pRCA, 20% dRCA, 20% o-pLCx --> med mgmt.   CKD (chronic kidney disease), stage IV (HCC)    Diabetic neuropathy (HCC)    Dyspnea on exertion    Ganglion cyst of wrist, right    a.) s/p excision 04/2011   Hepatic steatosis    History of 2019 novel coronavirus disease (COVID-19)    History of bilateral cataract extraction 2018   Hyperlipidemia    Hyperparathyroidism due to renal insufficiency (HCC)    Hypertension    Low testosterone in male    Nephrolithiasis    Obesity    OSA treated with BiPAP    Osteoarthritis    Peripheral edema    Right inguinal hernia    a.) s/p repair   Skin cancer    Type 2 diabetes mellitus with renal manifestations [facility])     Past Surgical History:  Procedure Laterality Date   A/V FISTULAGRAM Left 01/13/2023   Procedure: A/V Fistulagram;  Surgeon: Annice Needy, MD;  Location: [facility];  Service: Cardiovascular;  Laterality: Left;   A/V SHUNT INTERVENTION Left 04/11/2023   Procedure: A/V SHUNT INTERVENTION;  Surgeon: Annice Needy, MD;  Location: [facility];  Service: Cardiovascular;  Laterality: Left;   APPENDECTOMY     AV FISTULA PLACEMENT Left 09/19/2022   Procedure: ARTERIOVENOUS (AV) FISTULA CREATION (RADIO CEPHALIC);  Surgeon: Annice Needy, MD;  Location: [facility];  Service: Vascular;  Laterality: Left;   CATARACT EXTRACTION W/PHACO Left 05/20/2017   Procedure: CATARACT EXTRACTION PHACO AND INTRAOCULAR LENS PLACEMENT (IOC);  Surgeon: Galen Manila, MD;  Location: [facility];  Service: Ophthalmology;  Laterality: Left;  [place] 00:32.0 AP% 15.5 CDE 4.97 Fluid Pack Lot # [id number]   CATARACT  EXTRACTION W/PHACO Right 06/10/2017   Procedure: CATARACT EXTRACTION PHACO AND INTRAOCULAR LENS PLACEMENT (IOC);  Surgeon: Galen Manila, MD;  Location: [facility];  Service: Ophthalmology;  Laterality: Right;  [place]  00:51 AP% 15.4 CDE 7.95 Fluid pack lot # [id number]   CHOLECYSTECTOMY     COLONOSCOPY     x3   CYSTOSCOPY W/ RETROGRADES Right 08/13/2019   Procedure: CYSTOSCOPY WITH RETROGRADE PYELOGRAM;  Surgeon: Sondra Come, MD;  Location: [facility];  Service: Urology;  Laterality: Right;   CYSTOSCOPY WITH STENT PLACEMENT Right 07/26/2019   Procedure: CYSTOSCOPY WITH STENT PLACEMENT;  Surgeon: Crista Elliot, MD;  Location: [facility];  Service: Urology;  Laterality: Right;   CYSTOSCOPY/URETEROSCOPY/HOLMIUM LASER/STENT PLACEMENT Right 08/13/2019   Procedure: CYSTOSCOPY/URETEROSCOPY/HOLMIUM LASER/STENT EXCHANGE;  Surgeon: Sondra Come, MD;  Location: [facility];  Service: Urology;  Laterality: Right;   CYSTOSCOPY/URETEROSCOPY/HOLMIUM LASER/STENT PLACEMENT Left 02/27/2022   Procedure: CYSTOSCOPY/URETEROSCOPY/HOLMIUM LASER/STENT PLACEMENT;  Surgeon: Riki Altes, MD;  Location: [facility];  Service: Urology;  Laterality: Left;   DIALYSIS/PERMA CATHETER INSERTION N/A 04/11/2023   Procedure: DIALYSIS/PERMA CATHETER INSERTION;  Surgeon: Annice Needy, MD;  Location: [facility];  Service: Cardiovascular;  Laterality: N/A;   GANGLION CYST EXCISION Right    INGUINAL HERNIA REPAIR Right    LEFT HEART CATH AND CORONARY ANGIOGRAPHY N/A 12/31/2021   Procedure: LEFT HEART CATH AND CORONARY ANGIOGRAPHY;  Surgeon: Lamar Blinks, MD;  Location: [facility];  Service: Cardiovascular;  Laterality: N/A;   SHOULDER ARTHROSCOPY WITH SUBACROMIAL DECOMPRESSION AND OPEN ROTATOR C Right 09/11/2020   Procedure: Right shoulder arthroscopic rotator cuff repair vs Regeneten patch application, subacromial decompression, and biceps tenodesis - Dedra Skeens to Assist;  Surgeon: Signa Kell, MD;   Location: [facility];  Service: Orthopedics;  Laterality: Right;   TEMPORARY DIALYSIS CATHETER N/A 03/05/2022   Procedure: TEMPORARY DIALYSIS CATHETER;  Surgeon: Renford Dills, MD;  Location: [facility];  Service: Cardiovascular;  Laterality: N/A;   TONSILLECTOMY     URETEROSCOPY WITH HOLMIUM LASER LITHOTRIPSY Left 02/27/2022   Procedure: URETEROSCOPY WITH HOLMIUM LASER LITHOTRIPSY;  Surgeon: Riki Altes, MD;  Location: [facility];  Service: Urology;  Laterality: Left;   WRIST FRACTURE SURGERY Right      Social History   Tobacco Use   Smoking status: Never    Passive exposure: Never   Smokeless tobacco: Never  Vaping Use   Vaping status: Never Used  Substance Use Topics   Alcohol use: No   Drug use: Not Currently      Family History  Problem Relation Age of Onset   Emphysema Mother    COPD Mother    Heart disease Mother    Brain cancer Father      Allergies  Allergen Reactions   Codeine Nausea And Vomiting   Doxycycline    Erythromycin Rash     REVIEW OF SYSTEMS (Negative unless checked)   Constitutional: [] Weight loss  [] Fever  [] Chills Cardiac: [] Chest pain   [] Chest pressure   [] Palpitations   [] Shortness of breath when laying flat   [x] Shortness of breath at rest   [x] Shortness of breath with exertion. Vascular:  [] Pain in legs with walking   [] Pain in legs at rest   [] Pain in legs when laying flat   [] Claudication   [] Pain in feet when walking  [] Pain in feet at rest  [] Pain in feet when laying flat   [] History of DVT   [] Phlebitis   [] Swelling in legs   [] Varicose veins   [] Non-healing ulcers Pulmonary:   [] Uses home oxygen   [] Productive cough   [] Hemoptysis   [] Wheeze  [] COPD   [] Asthma Neurologic:  [x] Dizziness  [x] Blackouts   [] Seizures   [] History of stroke   [] History of TIA  [] Aphasia   [] Temporary blindness   [] Dysphagia   [] Weakness or numbness in arms   [] Weakness or numbness in legs Musculoskeletal:  [] Arthritis   []   Joint swelling    [x] Joint pain   [] Low back pain Hematologic:  [] Easy bruising  [] Easy bleeding   [] Hypercoagulable state   [x] Anemic   Gastrointestinal:  [] Blood in stool   [] Vomiting blood  [] Gastroesophageal reflux/heartburn   [] Abdominal pain Genitourinary:  [x] Chronic kidney disease   [] Difficult urination  [] Frequent urination  [] Burning with urination   [] Hematuria Skin:  [] Rashes   [] Ulcers   [] Wounds Psychological:  [] History of anxiety   []  History of major depression.  Physical Examination  BP 124/67   Pulse 79   Resp 20   Wt (!) 305 lb 12.8 oz (138.7 kg)   BMI 43.88 kg/m  Gen:  WD/WN, NAD Head: [place]/AT, No temporalis wasting. Ear/Nose/Throat: Hearing grossly intact, nares w/o erythema or drainage Eyes: Conjunctiva clear. Sclera non-icteric Neck: Supple.  Trachea midline Pulmonary:  Good air movement, no use of accessory muscles.  Cardiac: irregular Vascular: permcath in right chest without erythema. Vessel Right Left  Radial Palpable Palpable           Musculoskeletal: M/S 5/5 throughout.  No deformity or atrophy. Mild LE edema. Neurologic: Sensation grossly intact in extremities.  Symmetrical.  Speech is fluent.  Psychiatric: Judgment intact, Mood & affect appropriate for pt's clinical situation. Dermatologic: No rashes or ulcers noted.  No cellulitis or open wounds.      Labs No results found for this or any previous visit (from the past 2160 hours).  Radiology Abdomen 1 view (KUB) Result Date: 10/14/2023 CLINICAL DATA:  Kidney stone EXAM: ABDOMEN - 1 VIEW COMPARISON:  October 07, 2022 FINDINGS: The bowel gas pattern is normal. No radio-opaque calculi or other significant radiographic abnormality are seen. IMPRESSION: Negative. Electronically Signed   By: Fredrich Jefferson M.D.   On: 10/14/2023 10:16    Assessment/Plan  ESRD on dialysis [facility]) I continue to be hesitant to place an AV fistula with his low blood pressure and high risk status.  He does not want to have one done  either so it for now, we are going to hold off on any permanent access and continue his PermCath.  Hopefully over the next few months, we can get his blood pressure a little higher and make it safer to place the access as well as increase the likelihood of the access maturing.  Type 2 diabetes mellitus with renal manifestations (HCC) blood glucose control important in reducing the progression of atherosclerotic disease. Also, involved in wound healing. On appropriate medications.     Hyperlipemia lipid control important in reducing the progression of atherosclerotic disease. Continue statin therapy     Swelling of limb Some better with dialysis  Mikki Alexander, MD  11/04/2023 6:00 PM    This note was created with Dragon medical transcription system.  Any errors from dictation are purely unintentional

## 2023-11-04 NOTE — Assessment & Plan Note (Signed)
 I continue to be hesitant to place an AV fistula with his low blood pressure and high risk status.  He does not want to have one done either so it for now, we are going to hold off on any permanent access and continue his PermCath.  Hopefully over the next few months, we can get his blood pressure a little higher and make it safer to place the access as well as increase the likelihood of the access maturing.

## 2023-11-06 DIAGNOSIS — I509 Heart failure, unspecified: Secondary | ICD-10-CM | POA: Diagnosis not present

## 2023-11-06 DIAGNOSIS — I10 Essential (primary) hypertension: Secondary | ICD-10-CM | POA: Diagnosis not present

## 2023-11-06 DIAGNOSIS — Z0000 Encounter for general adult medical examination without abnormal findings: Secondary | ICD-10-CM | POA: Diagnosis not present

## 2023-11-06 DIAGNOSIS — F419 Anxiety disorder, unspecified: Secondary | ICD-10-CM | POA: Diagnosis not present

## 2023-11-06 DIAGNOSIS — N2581 Secondary hyperparathyroidism of renal origin: Secondary | ICD-10-CM | POA: Diagnosis not present

## 2023-11-06 DIAGNOSIS — I700 Atherosclerosis of aorta: Secondary | ICD-10-CM | POA: Diagnosis not present

## 2023-11-06 DIAGNOSIS — M25511 Pain in right shoulder: Secondary | ICD-10-CM | POA: Diagnosis not present

## 2023-11-06 DIAGNOSIS — E1122 Type 2 diabetes mellitus with diabetic chronic kidney disease: Secondary | ICD-10-CM | POA: Diagnosis not present

## 2023-11-06 DIAGNOSIS — E1140 Type 2 diabetes mellitus with diabetic neuropathy, unspecified: Secondary | ICD-10-CM | POA: Diagnosis not present

## 2023-11-06 DIAGNOSIS — J309 Allergic rhinitis, unspecified: Secondary | ICD-10-CM | POA: Diagnosis not present

## 2023-11-06 DIAGNOSIS — F321 Major depressive disorder, single episode, moderate: Secondary | ICD-10-CM | POA: Diagnosis not present

## 2023-11-06 DIAGNOSIS — E7800 Pure hypercholesterolemia, unspecified: Secondary | ICD-10-CM | POA: Diagnosis not present

## 2023-11-11 DIAGNOSIS — I700 Atherosclerosis of aorta: Secondary | ICD-10-CM | POA: Diagnosis not present

## 2023-11-11 DIAGNOSIS — I2510 Atherosclerotic heart disease of native coronary artery without angina pectoris: Secondary | ICD-10-CM | POA: Diagnosis not present

## 2023-11-11 DIAGNOSIS — G4733 Obstructive sleep apnea (adult) (pediatric): Secondary | ICD-10-CM | POA: Diagnosis not present

## 2023-11-11 DIAGNOSIS — I10 Essential (primary) hypertension: Secondary | ICD-10-CM | POA: Diagnosis not present

## 2023-11-11 DIAGNOSIS — E7800 Pure hypercholesterolemia, unspecified: Secondary | ICD-10-CM | POA: Diagnosis not present

## 2023-11-11 DIAGNOSIS — I5032 Chronic diastolic (congestive) heart failure: Secondary | ICD-10-CM | POA: Diagnosis not present

## 2023-11-11 DIAGNOSIS — I4891 Unspecified atrial fibrillation: Secondary | ICD-10-CM | POA: Diagnosis not present

## 2023-11-13 DIAGNOSIS — E1121 Type 2 diabetes mellitus with diabetic nephropathy: Secondary | ICD-10-CM | POA: Diagnosis not present

## 2023-11-19 DIAGNOSIS — E1140 Type 2 diabetes mellitus with diabetic neuropathy, unspecified: Secondary | ICD-10-CM | POA: Diagnosis not present

## 2023-11-19 DIAGNOSIS — Z992 Dependence on renal dialysis: Secondary | ICD-10-CM | POA: Diagnosis not present

## 2023-11-19 DIAGNOSIS — F321 Major depressive disorder, single episode, moderate: Secondary | ICD-10-CM | POA: Diagnosis not present

## 2023-11-19 DIAGNOSIS — E1122 Type 2 diabetes mellitus with diabetic chronic kidney disease: Secondary | ICD-10-CM | POA: Diagnosis not present

## 2023-11-19 DIAGNOSIS — N186 End stage renal disease: Secondary | ICD-10-CM | POA: Diagnosis not present

## 2023-11-19 DIAGNOSIS — I509 Heart failure, unspecified: Secondary | ICD-10-CM | POA: Diagnosis not present

## 2023-11-19 DIAGNOSIS — I700 Atherosclerosis of aorta: Secondary | ICD-10-CM | POA: Diagnosis not present

## 2023-11-21 DIAGNOSIS — N186 End stage renal disease: Secondary | ICD-10-CM | POA: Diagnosis not present

## 2023-11-21 DIAGNOSIS — Z23 Encounter for immunization: Secondary | ICD-10-CM | POA: Diagnosis not present

## 2023-11-21 DIAGNOSIS — D509 Iron deficiency anemia, unspecified: Secondary | ICD-10-CM | POA: Diagnosis not present

## 2023-11-21 DIAGNOSIS — Z992 Dependence on renal dialysis: Secondary | ICD-10-CM | POA: Diagnosis not present

## 2023-12-09 ENCOUNTER — Encounter (INDEPENDENT_AMBULATORY_CARE_PROVIDER_SITE_OTHER): Payer: Self-pay

## 2023-12-13 DIAGNOSIS — E1121 Type 2 diabetes mellitus with diabetic nephropathy: Secondary | ICD-10-CM | POA: Diagnosis not present

## 2023-12-20 DIAGNOSIS — N186 End stage renal disease: Secondary | ICD-10-CM | POA: Diagnosis not present

## 2023-12-20 DIAGNOSIS — Z992 Dependence on renal dialysis: Secondary | ICD-10-CM | POA: Diagnosis not present

## 2023-12-22 DIAGNOSIS — Z23 Encounter for immunization: Secondary | ICD-10-CM | POA: Diagnosis not present

## 2023-12-22 DIAGNOSIS — D509 Iron deficiency anemia, unspecified: Secondary | ICD-10-CM | POA: Diagnosis not present

## 2023-12-22 DIAGNOSIS — N186 End stage renal disease: Secondary | ICD-10-CM | POA: Diagnosis not present

## 2023-12-22 DIAGNOSIS — Z992 Dependence on renal dialysis: Secondary | ICD-10-CM | POA: Diagnosis not present

## 2023-12-29 ENCOUNTER — Other Ambulatory Visit: Payer: Self-pay

## 2023-12-29 ENCOUNTER — Emergency Department

## 2023-12-29 ENCOUNTER — Emergency Department
Admission: EM | Admit: 2023-12-29 | Discharge: 2023-12-29 | Disposition: A | Discharge status: Home / Self Care | Attending: Emergency Medicine | Admitting: Emergency Medicine

## 2023-12-29 DIAGNOSIS — S0990XA Unspecified injury of head, initial encounter: Secondary | ICD-10-CM | POA: Diagnosis not present

## 2023-12-29 DIAGNOSIS — W19XXXA Unspecified fall, initial encounter: Secondary | ICD-10-CM | POA: Diagnosis not present

## 2023-12-29 DIAGNOSIS — R55 Syncope and collapse: Secondary | ICD-10-CM | POA: Insufficient documentation

## 2023-12-29 DIAGNOSIS — I959 Hypotension, unspecified: Secondary | ICD-10-CM | POA: Diagnosis not present

## 2023-12-29 DIAGNOSIS — R739 Hyperglycemia, unspecified: Secondary | ICD-10-CM

## 2023-12-29 DIAGNOSIS — Z992 Dependence on renal dialysis: Secondary | ICD-10-CM | POA: Insufficient documentation

## 2023-12-29 DIAGNOSIS — N186 End stage renal disease: Secondary | ICD-10-CM | POA: Insufficient documentation

## 2023-12-29 DIAGNOSIS — I6782 Cerebral ischemia: Secondary | ICD-10-CM | POA: Diagnosis not present

## 2023-12-29 DIAGNOSIS — E1122 Type 2 diabetes mellitus with diabetic chronic kidney disease: Secondary | ICD-10-CM | POA: Insufficient documentation

## 2023-12-29 DIAGNOSIS — M4802 Spinal stenosis, cervical region: Secondary | ICD-10-CM | POA: Diagnosis not present

## 2023-12-29 DIAGNOSIS — I5030 Unspecified diastolic (congestive) heart failure: Secondary | ICD-10-CM | POA: Diagnosis not present

## 2023-12-29 DIAGNOSIS — I132 Hypertensive heart and chronic kidney disease with heart failure and with stage 5 chronic kidney disease, or end stage renal disease: Secondary | ICD-10-CM | POA: Diagnosis not present

## 2023-12-29 DIAGNOSIS — I6523 Occlusion and stenosis of bilateral carotid arteries: Secondary | ICD-10-CM | POA: Diagnosis not present

## 2023-12-29 DIAGNOSIS — Y92009 Unspecified place in unspecified non-institutional (private) residence as the place of occurrence of the external cause: Secondary | ICD-10-CM | POA: Diagnosis not present

## 2023-12-29 DIAGNOSIS — I10 Essential (primary) hypertension: Secondary | ICD-10-CM | POA: Diagnosis not present

## 2023-12-29 DIAGNOSIS — M542 Cervicalgia: Secondary | ICD-10-CM | POA: Diagnosis not present

## 2023-12-29 DIAGNOSIS — M47812 Spondylosis without myelopathy or radiculopathy, cervical region: Secondary | ICD-10-CM | POA: Diagnosis not present

## 2023-12-29 DIAGNOSIS — W08XXXA Fall from other furniture, initial encounter: Secondary | ICD-10-CM | POA: Insufficient documentation

## 2023-12-29 DIAGNOSIS — E1165 Type 2 diabetes mellitus with hyperglycemia: Secondary | ICD-10-CM | POA: Diagnosis not present

## 2023-12-29 LAB — BASIC METABOLIC PANEL WITH GFR
Anion gap: 13 (ref 5–15)
BUN: 23 mg/dL (ref 8–23)
CO2: 25 mmol/L (ref 22–32)
Calcium: 8.1 mg/dL — ABNORMAL LOW (ref 8.9–10.3)
Chloride: 99 mmol/L (ref 98–111)
Creatinine, Ser: 1.98 mg/dL — ABNORMAL HIGH (ref 0.61–1.24)
GFR, Estimated: 33 mL/min — ABNORMAL LOW (ref >60)
Glucose, Bld: 301 mg/dL — ABNORMAL HIGH (ref 70–99)
Potassium: 3.6 mmol/L (ref 3.5–5.1)
Sodium: 137 mmol/L (ref 135–145)

## 2023-12-29 LAB — CBC WITH DIFFERENTIAL/PLATELET
Abs Immature Granulocytes: 0.06 10*3/uL (ref 0.00–0.07)
Basophils Absolute: 0.1 10*3/uL (ref 0.0–0.1)
Basophils Relative: 1 %
Eosinophils Absolute: 0.4 10*3/uL (ref 0.0–0.5)
Eosinophils Relative: 4 %
HCT: 34.8 % — ABNORMAL LOW (ref 39.0–52.0)
Hemoglobin: 11.2 g/dL — ABNORMAL LOW (ref 13.0–17.0)
Immature Granulocytes: 1 %
Lymphocytes Relative: 16 %
Lymphs Abs: 1.8 10*3/uL (ref 0.7–4.0)
MCH: 30.3 pg (ref 26.0–34.0)
MCHC: 32.2 g/dL (ref 30.0–36.0)
MCV: 94.1 fL (ref 80.0–100.0)
Monocytes Absolute: 0.6 10*3/uL (ref 0.1–1.0)
Monocytes Relative: 6 %
Neutro Abs: 8 10*3/uL — ABNORMAL HIGH (ref 1.7–7.7)
Neutrophils Relative %: 72 %
Platelets: 301 10*3/uL (ref 150–400)
RBC: 3.7 MIL/uL — ABNORMAL LOW (ref 4.22–5.81)
RDW: 13.4 % (ref 11.5–15.5)
WBC: 10.9 10*3/uL — ABNORMAL HIGH (ref 4.0–10.5)
nRBC: 0 % (ref 0.0–0.2)

## 2023-12-29 LAB — CBG MONITORING, ED
Glucose-Capillary: 383 mg/dL — ABNORMAL HIGH (ref 70–99)
Glucose-Capillary: 297 mg/dL — ABNORMAL HIGH (ref 70–99)

## 2023-12-29 MED ORDER — INSULIN ASPART 100 UNIT/ML IJ SOLN
8.0000 [IU] | Freq: Once | INTRAMUSCULAR | Status: AC
  Administered 2023-12-29: 8 [IU] via INTRAVENOUS
  Filled 2023-12-29: qty 1

## 2023-12-29 MED ORDER — OXYCODONE-ACETAMINOPHEN 5-325 MG PO TABS
1.0000 | ORAL_TABLET | Freq: Once | ORAL | Status: AC
  Administered 2023-12-29: 1 via ORAL
  Filled 2023-12-29: qty 1

## 2023-12-29 NOTE — ED Provider Notes (Signed)
 [facility] Provider Note    Event Date/Time   First MD Initiated Contact with Patient 12/29/23 1612     (approximate)   History   Chief Complaint Loss of Consciousness   HPI  James Clarke is a 83 y.o. male with past medical history of hypertension, hyperlipidemia, diabetes, diastolic CHF, and ESRD on HD (MWF) who presents to the ED complaining of syncope.  Patient reports that he went to his usual dialysis appointment today, had been feeling fine this morning and during his treatment.  He states that he began to feel lightheaded and dizzy towards the end of his treatment, typically takes a dose of midodrine before his treatment and 30 minutes prior to the end, but had missed the second dose today.  He ended up taking it about 45 minutes after he typically does, around the time he began to notice the dizziness.  He was able to make it home, but was sitting on a barstool at home when he lost consciousness and fell to the ground, striking his head.  He denies any injuries from the fall, did not have any pain at in his chest or trouble breathing with this episode.  He does state that he has had syncopal episodes in the past after dialysis similar to today.     Physical Exam   Triage Vital Signs: ED Triage Vitals  Encounter Vitals Group     BP 12/29/23 1612 (!) 144/59     Systolic BP Percentile --      Diastolic BP Percentile --      Pulse Rate 12/29/23 1612 67     Resp 12/29/23 1612 17     Temp 12/29/23 1612 97.6 F (36.4 C)     Temp Source 12/29/23 1612 Oral     SpO2 12/29/23 1612 96 %     Weight 12/29/23 1618 300 lb (136.1 kg)     Height 12/29/23 1618 5\' 10"  (1.778 m)     Head Circumference --      Peak Flow --      Pain Score 12/29/23 1614 5     Pain Loc --      Pain Education --      Exclude from Growth Chart --     Most recent vital signs: Vitals:   12/29/23 1715 12/29/23 1834  BP:  (!) 122/51  Pulse:  60  Resp: 17 18  Temp:  98.2 F  (36.8 C)  SpO2:  97%    Constitutional: Alert and oriented. Eyes: Conjunctivae are normal. Head: Atraumatic. Nose: No congestion/rhinnorhea. Mouth/Throat: Mucous membranes are moist.  Neck: Cervical collar in place, no midline tenderness. Cardiovascular: Normal rate, regular rhythm. Grossly normal heart sounds.  2+ radial pulses bilaterally.  Right IJ TDC intact. Respiratory: Normal respiratory effort.  No retractions. Lungs CTAB. Gastrointestinal: Soft and nontender. No distention. Musculoskeletal: No lower extremity tenderness nor edema.  Neurologic:  Normal speech and language. No gross focal neurologic deficits are appreciated.    ED Results / Procedures / Treatments   Labs (all labs ordered are listed, but only abnormal results are displayed) Labs Reviewed  CBC WITH DIFFERENTIAL/PLATELET - Abnormal; Notable for the following components:      Result Value   WBC 10.9 (*)    RBC 3.70 (*)    Hemoglobin 11.2 (*)    HCT 34.8 (*)    Neutro Abs 8.0 (*)    All other components within normal limits  BASIC METABOLIC PANEL WITH  GFR - Abnormal; Notable for the following components:   Glucose, Bld 301 (*)    Creatinine, Ser 1.98 (*)    Calcium 8.1 (*)    GFR, Estimated 33 (*)    All other components within normal limits  CBG MONITORING, ED - Abnormal; Notable for the following components:   Glucose-Capillary 383 (*)    All other components within normal limits  CBG MONITORING, ED - Abnormal; Notable for the following components:   Glucose-Capillary 297 (*)    All other components within normal limits     EKG  ED ECG REPORT I, Twilla Galea, the attending physician, personally viewed and interpreted this ECG.   Date: 12/29/2023  EKG Time: 16:12  Rate: 67  Rhythm: normal sinus rhythm  Axis: LAD  Intervals:nonspecific intraventricular conduction delay  ST&T Change: None  RADIOLOGY CT head reviewed and interpreted by me with no hemorrhage or midline  shift.  PROCEDURES:  Critical Care performed: No  Procedures   MEDICATIONS ORDERED IN ED: Medications  oxyCODONE -acetaminophen  (PERCOCET/ROXICET) 5-325 MG per tablet 1 tablet (1 tablet Oral Given 12/29/23 1651)  insulin  aspart (novoLOG ) injection 8 Units (8 Units Intravenous Given 12/29/23 1832)     IMPRESSION / MDM / ASSESSMENT AND PLAN / ED COURSE  I reviewed the triage vital signs and the nursing notes.                              83 y.o. male with past medical history of hypertension, hyperlipidemia, diabetes, diastolic CHF, and ESRD on HD (MWF) who presents to the ED complaining of syncopal episode after dialysis today.  Patient's presentation is most consistent with acute presentation with potential threat to life or bodily function.  Differential diagnosis includes, but is not limited to, arrhythmia, orthostatic hypotension, vasovagal episode, intracranial injury, cervical spine injury.  Patient nontoxic-appearing and in no acute distress, vital signs are unremarkable.  EKG shows no evidence of arrhythmia or ischemia and overall low suspicion for cardiac etiology.  Patient has a history of similar episodes, suspect commendation of hypovolemia following dialysis as well as orthostasis.  He has now taken both of his typical doses of midodrine and BP in the ED is reassuring.  We will observe and screening labs, check CT head and cervical spine.  CT head and cervical spine are negative for acute process, labs consistent with known ESRD with no significant electrolyte abnormality, anemia, or leukocytosis.  No arrhythmia noted on cardiac monitor and blood pressure remained stable here in the ED.  Patient and family are eager for discharge home, counseled to return to the ED for new or worsening symptoms.  Patient agrees with plan.      FINAL CLINICAL IMPRESSION(S) / ED DIAGNOSES   Final diagnoses:  Syncope, unspecified syncope type  Hyperglycemia     Rx / DC Orders   ED  Discharge Orders     None        Note:  This document was prepared using Dragon voice recognition software and may include unintentional dictation errors.   Twilla Galea, MD 12/29/23 2039

## 2023-12-29 NOTE — ED Triage Notes (Signed)
 Pt to er room number three via ems, per ems pt is from home and got dizzy and fell hitting his head.  Per ems pt has a hx of hypotension post dialysis, states that he didn't take his midrodine.

## 2023-12-29 NOTE — ED Notes (Signed)
 C-collar removed by MD

## 2024-01-01 DIAGNOSIS — I10 Essential (primary) hypertension: Secondary | ICD-10-CM | POA: Diagnosis not present

## 2024-01-01 DIAGNOSIS — E7800 Pure hypercholesterolemia, unspecified: Secondary | ICD-10-CM | POA: Diagnosis not present

## 2024-01-01 DIAGNOSIS — Z992 Dependence on renal dialysis: Secondary | ICD-10-CM | POA: Diagnosis not present

## 2024-01-01 DIAGNOSIS — N186 End stage renal disease: Secondary | ICD-10-CM | POA: Diagnosis not present

## 2024-01-01 DIAGNOSIS — R55 Syncope and collapse: Secondary | ICD-10-CM | POA: Diagnosis not present

## 2024-01-01 DIAGNOSIS — I5020 Unspecified systolic (congestive) heart failure: Secondary | ICD-10-CM | POA: Diagnosis not present

## 2024-01-01 DIAGNOSIS — I700 Atherosclerosis of aorta: Secondary | ICD-10-CM | POA: Diagnosis not present

## 2024-01-06 DIAGNOSIS — Z794 Long term (current) use of insulin: Secondary | ICD-10-CM | POA: Diagnosis not present

## 2024-01-06 DIAGNOSIS — Z992 Dependence on renal dialysis: Secondary | ICD-10-CM | POA: Diagnosis not present

## 2024-01-06 DIAGNOSIS — N184 Chronic kidney disease, stage 4 (severe): Secondary | ICD-10-CM | POA: Diagnosis not present

## 2024-01-06 DIAGNOSIS — E1122 Type 2 diabetes mellitus with diabetic chronic kidney disease: Secondary | ICD-10-CM | POA: Diagnosis not present

## 2024-01-06 DIAGNOSIS — E1142 Type 2 diabetes mellitus with diabetic polyneuropathy: Secondary | ICD-10-CM | POA: Diagnosis not present

## 2024-01-06 DIAGNOSIS — E1165 Type 2 diabetes mellitus with hyperglycemia: Secondary | ICD-10-CM | POA: Diagnosis not present

## 2024-01-06 DIAGNOSIS — I10 Essential (primary) hypertension: Secondary | ICD-10-CM | POA: Diagnosis not present

## 2024-01-06 DIAGNOSIS — E1121 Type 2 diabetes mellitus with diabetic nephropathy: Secondary | ICD-10-CM | POA: Diagnosis not present

## 2024-01-06 DIAGNOSIS — N186 End stage renal disease: Secondary | ICD-10-CM | POA: Diagnosis not present

## 2024-01-08 DIAGNOSIS — B351 Tinea unguium: Secondary | ICD-10-CM | POA: Diagnosis not present

## 2024-01-08 DIAGNOSIS — Z794 Long term (current) use of insulin: Secondary | ICD-10-CM | POA: Diagnosis not present

## 2024-01-08 DIAGNOSIS — E1140 Type 2 diabetes mellitus with diabetic neuropathy, unspecified: Secondary | ICD-10-CM | POA: Diagnosis not present

## 2024-01-13 DIAGNOSIS — E1121 Type 2 diabetes mellitus with diabetic nephropathy: Secondary | ICD-10-CM | POA: Diagnosis not present

## 2024-01-19 DIAGNOSIS — N186 End stage renal disease: Secondary | ICD-10-CM | POA: Diagnosis not present

## 2024-01-19 DIAGNOSIS — Z992 Dependence on renal dialysis: Secondary | ICD-10-CM | POA: Diagnosis not present

## 2024-01-21 DIAGNOSIS — D509 Iron deficiency anemia, unspecified: Secondary | ICD-10-CM | POA: Diagnosis not present

## 2024-01-21 DIAGNOSIS — Z992 Dependence on renal dialysis: Secondary | ICD-10-CM | POA: Diagnosis not present

## 2024-01-21 DIAGNOSIS — N186 End stage renal disease: Secondary | ICD-10-CM | POA: Diagnosis not present

## 2024-02-03 ENCOUNTER — Ambulatory Visit (INDEPENDENT_AMBULATORY_CARE_PROVIDER_SITE_OTHER): Admitting: Vascular Surgery

## 2024-02-03 ENCOUNTER — Encounter (INDEPENDENT_AMBULATORY_CARE_PROVIDER_SITE_OTHER): Payer: Self-pay | Admitting: Vascular Surgery

## 2024-02-03 VITALS — BP 129/74 | HR 76 | Ht 70.0 in | Wt 305.2 lb

## 2024-02-03 DIAGNOSIS — E785 Hyperlipidemia, unspecified: Secondary | ICD-10-CM | POA: Diagnosis not present

## 2024-02-03 DIAGNOSIS — Z794 Long term (current) use of insulin: Secondary | ICD-10-CM | POA: Diagnosis not present

## 2024-02-03 DIAGNOSIS — Z992 Dependence on renal dialysis: Secondary | ICD-10-CM | POA: Diagnosis not present

## 2024-02-03 DIAGNOSIS — N186 End stage renal disease: Secondary | ICD-10-CM

## 2024-02-03 DIAGNOSIS — M7989 Other specified soft tissue disorders: Secondary | ICD-10-CM | POA: Diagnosis not present

## 2024-02-03 DIAGNOSIS — E1122 Type 2 diabetes mellitus with diabetic chronic kidney disease: Secondary | ICD-10-CM

## 2024-02-03 NOTE — Assessment & Plan Note (Signed)
 Patient had a failed left radiocephalic AV fistula largely due to overall hypoperfusion and hypotension.  He has significant heart issues.  He is to see the cardiologist next couple of weeks.  We had a long discussion with he and his wife today regarding his dialysis access.  He does not want to have a fistula or graft placed and wants to continue using his PermCath.  Typically, that is a poor choice but given his hypotension I am not sure how well he would support an AV fistula or graft and whether or not this would be mature and usable for dialysis in the future.  I am okay with him continuing to use his PermCath at this point.  If he decides he would like to have an extremity access, he will contact our office.

## 2024-02-03 NOTE — Progress Notes (Signed)
 MRN : [id number]  James Clarke is a 83 y.o. (May 04, 1941) male who presents with chief complaint of  Chief Complaint  Patient presents with   fu 3-4 months no studies  .  History of Present Illness: Patient returns today in follow up of dialysis access and hypotension.  He had a left radiocephalic AV fistula that failed previously.  This was likely largely due to his chronic hypotension.  He has had multiple episodes where he has had lightheadedness and passing out.  His wife had called the rescue squad once since his last visit.  She has kept several blood pressure readings which are mostly in the 1 teens and 120s but she says it occasionally drops will lower and he gets very symptomatic when that happens.  He has maintained on a PermCath now for about 6 months.  He is very leery of placing a new access due to the problems with his initial access and his low blood pressure and that is very understandable.  Current Outpatient Medications  Medication Sig Dispense Refill   acetaminophen  (TYLENOL ) 500 MG tablet Take 1,000 mg by mouth every 6 (six) hours as needed (shoulder pain).     albuterol  (PROVENTIL  HFA;VENTOLIN  HFA) 108 (90 Base) MCG/ACT inhaler Inhale 2 puffs into the lungs every 6 (six) hours as needed for wheezing or shortness of breath.     aspirin  EC 81 MG tablet Take by mouth.     calcitRIOL  (ROCALTROL ) 0.25 MCG capsule Take 0.25 mcg by mouth daily.     cetirizine (ZYRTEC) 10 MG chewable tablet Chew 10 mg by mouth daily as needed for allergies.     Continuous Blood Gluc Sensor (FREESTYLE LIBRE 14 DAY SENSOR) MISC by Does not apply route.     diazepam  (VALIUM ) 2 MG tablet Take 2 mg by mouth every 12 (twelve) hours as needed.     fluticasone  (FLONASE ) 50 MCG/ACT nasal spray as needed.     furosemide  (LASIX ) 40 MG tablet Take 2 tablets (80 mg total) by mouth 2 (two) times daily. On non dialysis days, Sunday, Tuesday, Thursday, Saturday and Sunday 120 tablet 0   GLOBAL INJECT EASE  INSULIN  SYR 31G X 5/16 0.5 ML MISC Inject into the skin.     glucose blood (ONETOUCH ULTRA) test strip TEST BLOOD SUGAR 4 TIMES DAILY     insulin  aspart (NOVOLOG ) 100 UNIT/ML injection Inject 14 Units into the skin 3 (three) times daily with meals. (Patient taking differently: Inject 35-45 Units into the skin 3 (three) times daily with meals. 45 units breakfast 35 units lunch 45 units supper) 10 mL 11   insulin  detemir (LEVEMIR ) 100 UNIT/ML injection Inject 0.5 mLs (50 Units total) into the skin at bedtime. (Patient taking differently: Inject 35 Units into the skin at bedtime.) 10 mL 1   isosorbide  mononitrate (IMDUR ) 30 MG 24 hr tablet Take 1 tablet (30 mg total) by mouth daily. 30 tablet 2   Lancets (ONETOUCH DELICA PLUS LANCET30G) MISC USE 1 LANCET 4 TIMES DAILY     lovastatin (MEVACOR) 40 MG tablet Take 40 mg by mouth daily with supper.     metoprolol  succinate (TOPROL -XL) 50 MG 24 hr tablet Take 2 tablets (100 mg total) by mouth daily. Do not take on Monday, Wednesday and Friday on dialysis days 120 tablet 0   midodrine (PROAMATINE) 10 MG tablet Take 10 mg by mouth 3 (three) times a week. On dialysis days and PRN on non-dialysis days     mupirocin   ointment (BACTROBAN ) 2 % Apply 1 Application topically 2 (two) times daily. 15 g 0   nitroGLYCERIN  (NITROSTAT ) 0.4 MG SL tablet Place 1 tablet (0.4 mg total) under the tongue every 5 (five) minutes as needed for chest pain. 20 tablet 1   sacubitril -valsartan  (ENTRESTO ) 24-26 MG Take 1 tablet by mouth 2 (two) times daily. 60 tablet 2   sertraline  (ZOLOFT ) 50 MG tablet Take 50 mg by mouth at bedtime.     tamsulosin  (FLOMAX ) 0.4 MG CAPS capsule TAKE (1) CAPSULE BY MOUTH EVERY DAY 30 capsule 11   No current facility-administered medications for this visit.    Past Medical History:  Diagnosis Date   (HFpEF) heart failure with preserved ejection fraction (HCC)    a.) TTE 11/26/2021: EF 45%, mod LVH, post HK, mod LAE, mild MR/TR, G2DD; b.) TTE  02/26/2022: EF 55-60%, BAE, triv MR, G2DD; c.) TTE 07/09/2022: EF 45%, post HK, LVH, mod LAE, triv TR/PR, mild MR, G1DD   Anemia of chronic renal failure    Aortic atherosclerosis (HCC)    Asthma    Bell's palsy    BPH (benign prostatic hyperplasia)    CAD (coronary artery disease)    a.) LHC 12/31/2021: 60% mLAD, 20% pRCA, 20% dRCA, 20% o-pLCx --> med mgmt.   CKD (chronic kidney disease), stage IV (HCC)    Diabetic neuropathy (HCC)    Dyspnea on exertion    Ganglion cyst of wrist, right    a.) s/p excision 04/2011   Hepatic steatosis    History of 2019 novel coronavirus disease (COVID-19)    History of bilateral cataract extraction 2018   Hyperlipidemia    Hyperparathyroidism due to renal insufficiency (HCC)    Hypertension    Low testosterone in male    Nephrolithiasis    Obesity    OSA treated with BiPAP    Osteoarthritis    Peripheral edema    Right inguinal hernia    a.) s/p repair   Skin cancer    Type 2 diabetes mellitus with renal manifestations [facility])     Past Surgical History:  Procedure Laterality Date   A/V FISTULAGRAM Left 01/13/2023   Procedure: A/V Fistulagram;  Surgeon: Marea Selinda RAMAN, MD;  Location: [facility];  Service: Cardiovascular;  Laterality: Left;   A/V SHUNT INTERVENTION Left 04/11/2023   Procedure: A/V SHUNT INTERVENTION;  Surgeon: Marea Selinda RAMAN, MD;  Location: [facility];  Service: Cardiovascular;  Laterality: Left;   APPENDECTOMY     AV FISTULA PLACEMENT Left 09/19/2022   Procedure: ARTERIOVENOUS (AV) FISTULA CREATION (RADIO CEPHALIC);  Surgeon: Marea Selinda RAMAN, MD;  Location: [facility];  Service: Vascular;  Laterality: Left;   CATARACT EXTRACTION W/PHACO Left 05/20/2017   Procedure: CATARACT EXTRACTION PHACO AND INTRAOCULAR LENS PLACEMENT (IOC);  Surgeon: Jaye Fallow, MD;  Location: [facility];  Service: Ophthalmology;  Laterality: Left;  US  00:32.0 AP% 15.5 CDE 4.97 Fluid Pack Lot # [id number]   CATARACT EXTRACTION W/PHACO Right  06/10/2017   Procedure: CATARACT EXTRACTION PHACO AND INTRAOCULAR LENS PLACEMENT (IOC);  Surgeon: Jaye Fallow, MD;  Location: [facility];  Service: Ophthalmology;  Laterality: Right;  US   00:51 AP% 15.4 CDE 7.95 Fluid pack lot # [id number]   CHOLECYSTECTOMY     COLONOSCOPY     x3   CYSTOSCOPY W/ RETROGRADES Right 08/13/2019   Procedure: CYSTOSCOPY WITH RETROGRADE PYELOGRAM;  Surgeon: Francisca Redell BROCKS, MD;  Location: [facility];  Service: Urology;  Laterality: Right;   CYSTOSCOPY WITH STENT  PLACEMENT Right 07/26/2019   Procedure: CYSTOSCOPY WITH STENT PLACEMENT;  Surgeon: Carolee Sherwood JONETTA DOUGLAS, MD;  Location: [facility];  Service: Urology;  Laterality: Right;   CYSTOSCOPY/URETEROSCOPY/HOLMIUM LASER/STENT PLACEMENT Right 08/13/2019   Procedure: CYSTOSCOPY/URETEROSCOPY/HOLMIUM LASER/STENT EXCHANGE;  Surgeon: Francisca Redell BROCKS, MD;  Location: [facility];  Service: Urology;  Laterality: Right;   CYSTOSCOPY/URETEROSCOPY/HOLMIUM LASER/STENT PLACEMENT Left 02/27/2022   Procedure: CYSTOSCOPY/URETEROSCOPY/HOLMIUM LASER/STENT PLACEMENT;  Surgeon: Twylla Glendia BROCKS, MD;  Location: [facility];  Service: Urology;  Laterality: Left;   DIALYSIS/PERMA CATHETER INSERTION N/A 04/11/2023   Procedure: DIALYSIS/PERMA CATHETER INSERTION;  Surgeon: Marea Selinda RAMAN, MD;  Location: [facility];  Service: Cardiovascular;  Laterality: N/A;   GANGLION CYST EXCISION Right    INGUINAL HERNIA REPAIR Right    LEFT HEART CATH AND CORONARY ANGIOGRAPHY N/A 12/31/2021   Procedure: LEFT HEART CATH AND CORONARY ANGIOGRAPHY;  Surgeon: Hester Wolm PARAS, MD;  Location: [facility];  Service: Cardiovascular;  Laterality: N/A;   SHOULDER ARTHROSCOPY WITH SUBACROMIAL DECOMPRESSION AND OPEN ROTATOR C Right 09/11/2020   Procedure: Right shoulder arthroscopic rotator cuff repair vs Regeneten patch application, subacromial decompression, and biceps tenodesis - Krystal Doyne to Assist;  Surgeon: Tobie Priest, MD;  Location: [facility];   Service: Orthopedics;  Laterality: Right;   TEMPORARY DIALYSIS CATHETER N/A 03/05/2022   Procedure: TEMPORARY DIALYSIS CATHETER;  Surgeon: Jama Cordella MATSU, MD;  Location: [facility];  Service: Cardiovascular;  Laterality: N/A;   TONSILLECTOMY     URETEROSCOPY WITH HOLMIUM LASER LITHOTRIPSY Left 02/27/2022   Procedure: URETEROSCOPY WITH HOLMIUM LASER LITHOTRIPSY;  Surgeon: Twylla Glendia BROCKS, MD;  Location: [facility];  Service: Urology;  Laterality: Left;   WRIST FRACTURE SURGERY Right      Social History   Tobacco Use   Smoking status: Never    Passive exposure: Never   Smokeless tobacco: Never  Vaping Use   Vaping status: Never Used  Substance Use Topics   Alcohol use: No   Drug use: Not Currently      Family History  Problem Relation Age of Onset   Emphysema Mother    COPD Mother    Heart disease Mother    Brain cancer Father      Allergies  Allergen Reactions   Codeine Nausea And Vomiting   Doxycycline    Erythromycin Rash     REVIEW OF SYSTEMS (Negative unless checked)   Constitutional: [] Weight loss  [] Fever  [] Chills Cardiac: [] Chest pain   [] Chest pressure   [] Palpitations   [] Shortness of breath when laying flat   [x] Shortness of breath at rest   [x] Shortness of breath with exertion. Vascular:  [] Pain in legs with walking   [] Pain in legs at rest   [] Pain in legs when laying flat   [] Claudication   [] Pain in feet when walking  [] Pain in feet at rest  [] Pain in feet when laying flat   [] History of DVT   [] Phlebitis   [] Swelling in legs   [] Varicose veins   [] Non-healing ulcers Pulmonary:   [] Uses home oxygen   [] Productive cough   [] Hemoptysis   [] Wheeze  [] COPD   [] Asthma Neurologic:  [x] Dizziness  [x] Blackouts   [] Seizures   [] History of stroke   [] History of TIA  [] Aphasia   [] Temporary blindness   [] Dysphagia   [] Weakness or numbness in arms   [] Weakness or numbness in legs Musculoskeletal:  [] Arthritis   [] Joint swelling   [x] Joint pain   [] Low  back pain Hematologic:  []   Easy bruising  [] Easy bleeding   [] Hypercoagulable state   [x] Anemic   Gastrointestinal:  [] Blood in stool   [] Vomiting blood  [] Gastroesophageal reflux/heartburn   [] Abdominal pain Genitourinary:  [x] Chronic kidney disease   [] Difficult urination  [] Frequent urination  [] Burning with urination   [] Hematuria Skin:  [] Rashes   [] Ulcers   [] Wounds Psychological:  [] History of anxiety   []  History of major depression.  Physical Examination  BP 129/74   Pulse 76   Ht 5' 10 (1.778 m)   Wt (!) 305 lb 4 oz (138.5 kg)   BMI 43.80 kg/m  Gen:  WD/WN, NAD. Appears younger than stated age. Obese  Head: [place]/AT, No temporalis wasting. Ear/Nose/Throat: Hearing grossly intact, nares w/o erythema or drainage Eyes: Conjunctiva clear. Sclera non-icteric Neck: Supple.  Trachea midline Pulmonary:  Good air movement, no use of accessory muscles.  Cardiac: RRR, no JVD Vascular: PermCath in right chest with no erythema or drainage. Vessel Right Left  Radial Palpable Palpable           Musculoskeletal: M/S 5/5 throughout.  No deformity or atrophy. Mild LE edema. Neurologic: Sensation grossly intact in extremities.  Symmetrical.  Speech is fluent.  Psychiatric: Judgment intact, Mood & affect appropriate for pt's clinical situation. Dermatologic: No rashes or ulcers noted.  No cellulitis or open wounds.      Labs Recent Results (from the past 2160 hours)  CBC with Differential     Status: Abnormal   Collection Time: 12/29/23  4:15 PM  Result Value Ref Range   WBC 10.9 (H) 4.0 - 10.5 K/uL   RBC 3.70 (L) 4.22 - 5.81 MIL/uL   Hemoglobin 11.2 (L) 13.0 - 17.0 g/dL   HCT 65.1 (L) 60.9 - 47.9 %   MCV 94.1 80.0 - 100.0 fL   MCH 30.3 26.0 - 34.0 pg   MCHC 32.2 30.0 - 36.0 g/dL   RDW 86.5 88.4 - 84.4 %   Platelets 301 150 - 400 K/uL   nRBC 0.0 0.0 - 0.2 %   Neutrophils Relative % 72 %   Neutro Abs 8.0 (H) 1.7 - 7.7 K/uL   Lymphocytes Relative 16 %   Lymphs Abs 1.8 0.7 -  4.0 K/uL   Monocytes Relative 6 %   Monocytes Absolute 0.6 0.1 - 1.0 K/uL   Eosinophils Relative 4 %   Eosinophils Absolute 0.4 0.0 - 0.5 K/uL   Basophils Relative 1 %   Basophils Absolute 0.1 0.0 - 0.1 K/uL   Immature Granulocytes 1 %   Abs Immature Granulocytes 0.06 0.00 - 0.07 K/uL    Comment: Performed at [facility], [address] [place], [place] [address]  Basic metabolic panel     Status: Abnormal   Collection Time: 12/29/23  4:15 PM  Result Value Ref Range   Sodium 137 135 - 145 mmol/L   Potassium 3.6 3.5 - 5.1 mmol/L   Chloride 99 98 - 111 mmol/L   CO2 25 22 - 32 mmol/L   Glucose, Bld 301 (H) 70 - 99 mg/dL    Comment: Glucose reference range applies only to samples taken after fasting for at least 8 hours.   BUN 23 8 - 23 mg/dL   Creatinine, Ser 8.01 (H) 0.61 - 1.24 mg/dL   Calcium 8.1 (L) 8.9 - 10.3 mg/dL   GFR, Estimated 33 (L) >60 mL/min    Comment: (NOTE) Calculated using the CKD-EPI Creatinine Equation (2021)    Anion gap 13 5 - 15  Comment: Performed at [facility], [address] [place], [place] [address]  CBG monitoring, ED     Status: Abnormal   Collection Time: 12/29/23  6:12 PM  Result Value Ref Range   Glucose-Capillary 383 (H) 70 - 99 mg/dL    Comment: Glucose reference range applies only to samples taken after fasting for at least 8 hours.  CBG monitoring, ED     Status: Abnormal   Collection Time: 12/29/23  7:49 PM  Result Value Ref Range   Glucose-Capillary 297 (H) 70 - 99 mg/dL    Comment: Glucose reference range applies only to samples taken after fasting for at least 8 hours.    Radiology No results found.  Assessment/Plan  ESRD on dialysis [facility]) Patient had a failed left radiocephalic AV fistula largely due to overall hypoperfusion and hypotension.  He has significant heart issues.  He is to see the cardiologist next couple of weeks.  We had a long discussion with he and his wife today regarding his dialysis  access.  He does not want to have a fistula or graft placed and wants to continue using his PermCath.  Typically, that is a poor choice but given his hypotension I am not sure how well he would support an AV fistula or graft and whether or not this would be mature and usable for dialysis in the future.  I am okay with him continuing to use his PermCath at this point.  If he decides he would like to have an extremity access, he will contact our office.  Type 2 diabetes mellitus with renal manifestations (HCC) blood glucose control important in reducing the progression of atherosclerotic disease. Also, involved in wound healing. On appropriate medications.     Hyperlipemia lipid control important in reducing the progression of atherosclerotic disease. Continue statin therapy     Swelling of limb Some better with dialysis  Selinda Gu, MD  02/03/2024 1:57 PM    This note was created with Dragon medical transcription system.  Any errors from dictation are purely unintentional

## 2024-02-05 DIAGNOSIS — E1140 Type 2 diabetes mellitus with diabetic neuropathy, unspecified: Secondary | ICD-10-CM | POA: Diagnosis not present

## 2024-02-05 DIAGNOSIS — I700 Atherosclerosis of aorta: Secondary | ICD-10-CM | POA: Diagnosis not present

## 2024-02-05 DIAGNOSIS — F419 Anxiety disorder, unspecified: Secondary | ICD-10-CM | POA: Diagnosis not present

## 2024-02-05 DIAGNOSIS — J309 Allergic rhinitis, unspecified: Secondary | ICD-10-CM | POA: Diagnosis not present

## 2024-02-05 DIAGNOSIS — M25511 Pain in right shoulder: Secondary | ICD-10-CM | POA: Diagnosis not present

## 2024-02-05 DIAGNOSIS — F321 Major depressive disorder, single episode, moderate: Secondary | ICD-10-CM | POA: Diagnosis not present

## 2024-02-05 DIAGNOSIS — I10 Essential (primary) hypertension: Secondary | ICD-10-CM | POA: Diagnosis not present

## 2024-02-05 DIAGNOSIS — E7800 Pure hypercholesterolemia, unspecified: Secondary | ICD-10-CM | POA: Diagnosis not present

## 2024-02-05 DIAGNOSIS — N2581 Secondary hyperparathyroidism of renal origin: Secondary | ICD-10-CM | POA: Diagnosis not present

## 2024-02-05 DIAGNOSIS — E1122 Type 2 diabetes mellitus with diabetic chronic kidney disease: Secondary | ICD-10-CM | POA: Diagnosis not present

## 2024-02-05 DIAGNOSIS — Z1331 Encounter for screening for depression: Secondary | ICD-10-CM | POA: Diagnosis not present

## 2024-02-05 DIAGNOSIS — I509 Heart failure, unspecified: Secondary | ICD-10-CM | POA: Diagnosis not present

## 2024-02-09 ENCOUNTER — Telehealth: Payer: Self-pay | Admitting: Family

## 2024-02-09 NOTE — Telephone Encounter (Signed)
 Called to confirm/remind patient of their appointment [facility] on [facility]/10/[facility].   Appointment:   [x] Confirmed  [] Left mess   [] No answer/No voice mail  [] VM Full/unable to leave message  [] Phone not in service  Patient reminded to bring all medications and/or complete list.  Confirmed patient has transportation. Gave directions, instructed to utilize valet parking.

## 2024-02-10 ENCOUNTER — Ambulatory Visit: Attending: Family | Admitting: Family

## 2024-02-10 ENCOUNTER — Encounter: Payer: Self-pay | Admitting: Family

## 2024-02-10 VITALS — BP 135/60 | HR 65 | Wt 310.0 lb

## 2024-02-10 DIAGNOSIS — I132 Hypertensive heart and chronic kidney disease with heart failure and with stage 5 chronic kidney disease, or end stage renal disease: Secondary | ICD-10-CM | POA: Insufficient documentation

## 2024-02-10 DIAGNOSIS — R55 Syncope and collapse: Secondary | ICD-10-CM | POA: Diagnosis present

## 2024-02-10 DIAGNOSIS — I428 Other cardiomyopathies: Secondary | ICD-10-CM | POA: Insufficient documentation

## 2024-02-10 DIAGNOSIS — I5022 Chronic systolic (congestive) heart failure: Secondary | ICD-10-CM | POA: Diagnosis not present

## 2024-02-10 DIAGNOSIS — Z7901 Long term (current) use of anticoagulants: Secondary | ICD-10-CM | POA: Diagnosis not present

## 2024-02-10 DIAGNOSIS — I2510 Atherosclerotic heart disease of native coronary artery without angina pectoris: Secondary | ICD-10-CM | POA: Diagnosis not present

## 2024-02-10 DIAGNOSIS — E785 Hyperlipidemia, unspecified: Secondary | ICD-10-CM | POA: Diagnosis not present

## 2024-02-10 DIAGNOSIS — I4891 Unspecified atrial fibrillation: Secondary | ICD-10-CM | POA: Diagnosis not present

## 2024-02-10 DIAGNOSIS — E1122 Type 2 diabetes mellitus with diabetic chronic kidney disease: Secondary | ICD-10-CM | POA: Insufficient documentation

## 2024-02-10 DIAGNOSIS — R600 Localized edema: Secondary | ICD-10-CM | POA: Diagnosis present

## 2024-02-10 DIAGNOSIS — I10 Essential (primary) hypertension: Secondary | ICD-10-CM | POA: Diagnosis not present

## 2024-02-10 DIAGNOSIS — R0602 Shortness of breath: Secondary | ICD-10-CM | POA: Diagnosis present

## 2024-02-10 DIAGNOSIS — J45909 Unspecified asthma, uncomplicated: Secondary | ICD-10-CM | POA: Insufficient documentation

## 2024-02-10 DIAGNOSIS — Z992 Dependence on renal dialysis: Secondary | ICD-10-CM | POA: Insufficient documentation

## 2024-02-10 DIAGNOSIS — Z794 Long term (current) use of insulin: Secondary | ICD-10-CM | POA: Insufficient documentation

## 2024-02-10 DIAGNOSIS — N186 End stage renal disease: Secondary | ICD-10-CM | POA: Diagnosis not present

## 2024-02-10 DIAGNOSIS — I480 Paroxysmal atrial fibrillation: Secondary | ICD-10-CM | POA: Diagnosis not present

## 2024-02-10 DIAGNOSIS — G4733 Obstructive sleep apnea (adult) (pediatric): Secondary | ICD-10-CM | POA: Diagnosis not present

## 2024-02-10 NOTE — Progress Notes (Signed)
 Advanced [facility] Failure Clinic Note     PCP: Jeffie Craze, MD Primary Cardiologist: Florencio Kava, MD / Lucyann Palma, PA (last seen 03/25) HF provider: Wilburn Fillers, MD (last seen 06/25; returns 08/25)  Chief Complaint: shortness of breath   HPI:  James Clarke is a 83 y/o male with a history of asthma, IDDM, hyperlipidemia, HTN, nonobstructive CAD, CKD, bell's palsy, sleep apnea and chronic heart failure. Had Percutaneous transluminal angioplasty of the distal forearm cephalic vein with 5 mm diameter by 6 cm in length Lutonix drug-coated angioplasty balloon 01/13/23. Started dialysis 09/24.  Echo 02/26/22: EF of 55-60% along with mild LVH, LAE and trivial James.  Admitted 06/12/22 due to worsening shortness of breath. Patient states that he has had shortness of breath for several days but worse 1 day prior to his admission. He states that he slept in his recliner with his CPAP because he could not breathe laying flat. Cardiology & nephrology consults obtained. Diuretic changed. PT/OT evaluations done. Discharged after 8 days.   Echo 07/09/22: EF of 45% along with moderate LVH.  Patient was seen on 12/09/2022 by nephrology at which time he was started on IV Lasix  80 mg twice a week with oral Lasix  on the rest of the days.   Admitted 12/13/22 due to due to increased shortness of breath and chest pain. 2 weeks ago, he had a rapid worsening in his dyspnea on exertion and shortness of breath at rest. He has not had any increased urine output since increasing his diuretic regimen as instructed by his nephrologist.   Echo 12/17/22: EF 50-55% along with mild LAE.     Admitted 05/26/23 due to worsening SOB and new onset chest pain. Developed hypotension after being on dialysis for [redacted] weeks along with symptomatic dizziness at the end of dialysis. Dialysis day meds adjusted but symptoms persisted. Night prior to admission, chest pain developed that woke him up. No improvement with SL NTG. No change in  weight. CXR showing pulmonary congestion. First troponin was 300. Heparin  and IV lasix  given. Echo 05/26/23: EF 30-35% with moderate LVH, mild James. Nephrology/ cardiology consulted and dialysis done.   Was in the ED 12/29/23 with LOC after a dialysis session. Was delayed in taking his midodrine dose and had syncopal event at home sitting on a barstool. Hit his head. EKG shows no evidence of arrhythmia or ischemia .CT head and cervical spine are negative for acute process.   Saw HF provider 06/25 where entresto  was cut in half due to continued dizziness. Continue holding entresto / metoprolol  on dialysis days.   He presents today for a HF follow-up visit with a chief complaint of worsening shortness of breath. Has associated fatigue, chest tightness, occasional palpitations, dizziness, pedal edema. Continues to have episodes of hypotension which resulted in a syncopal event last month. He takes midodrine before dialysis, again ~ 1 hour before dialysis ends and occasionally a third dose. DBP getting in the 40's at home. SBP low 100's. Holds his entresto  and metoprolol  on dialysis day.   Even with reduction of entresto  to 1/2 tablet, he's still getting low BP's and dizziness along with this.   Previous cardiac studies:  LHC 12/31/21:    Mid LAD lesion is 60% stenosed.   Prox RCA lesion is 20% stenosed.   Dist RCA lesion is 20% stenosed.   Ost Cx to Prox Cx lesion is 20% stenosed.   The left ventricular systolic function is normal.   LV end diastolic pressure is moderately elevated.  The left ventricular ejection fraction is greater than 65% by visual estimate.  Patient has significant elevated end-diastolic pressure  Stress test 06/10/23: no ischemia, low risk study  ROS: All systems negative except as listed in HPI, PMH and Problem List.  SH:  Social History   Socioeconomic History   Marital status: Married    Spouse name: Not on file   Number of children: Not on file   Years of education:  Not on file   Highest education level: Not on file  Occupational History   Not on file  Tobacco Use   Smoking status: Never    Passive exposure: Never   Smokeless tobacco: Never  Vaping Use   Vaping status: Never Used  Substance and Sexual Activity   Alcohol use: No   Drug use: Not Currently   Sexual activity: Yes    Birth control/protection: None  Other Topics Concern   Not on file  Social History Narrative   Not on file   Social Drivers of Health   Financial Resource Strain: Low Risk  (11/06/2023)   Received from [facility]   Overall Financial Resource Strain (CARDIA)    Difficulty of Paying Living Expenses: Not hard at all  Food Insecurity: No Food Insecurity (11/06/2023)   Received from [facility]   Hunger Vital Sign    Within the past 12 months, you worried that your food would run out before you got the money to buy more.: Never true    Within the past 12 months, the food you bought just didn't last and you didn't have money to get more.: Never true  Transportation Needs: No Transportation Needs (11/06/2023)   Received from [facility] - Transportation    In the past 12 months, has lack of transportation kept you from medical appointments or from getting medications?: No    Lack of Transportation (Non-Medical): No  Physical Activity: Not on file  Stress: Not on file  Social Connections: Not on file  Intimate Partner Violence: Not At Risk (05/26/2023)   Humiliation, Afraid, Rape, and Kick questionnaire    Fear of Current or Ex-Partner: No    Emotionally Abused: No    Physically Abused: No    Sexually Abused: No    FH:  Family History  Problem Relation Age of Onset   Emphysema Mother    COPD Mother    Heart disease Mother    Brain cancer Father     Past Medical History:  Diagnosis Date   (HFpEF) heart failure with preserved ejection fraction (HCC)    a.) TTE 11/26/2021: EF 45%, mod LVH, post  HK, mod LAE, mild James/TR, G2DD; b.) TTE 02/26/2022: EF 55-60%, BAE, triv James, G2DD; c.) TTE 07/09/2022: EF 45%, post HK, LVH, mod LAE, triv TR/PR, mild James, G1DD   Anemia of chronic renal failure    Aortic atherosclerosis (HCC)    Asthma    Bell's palsy    BPH (benign prostatic hyperplasia)    CAD (coronary artery disease)    a.) LHC 12/31/2021: 60% mLAD, 20% pRCA, 20% dRCA, 20% o-pLCx --> med mgmt.   CKD (chronic kidney disease), stage IV (HCC)    Diabetic neuropathy (HCC)    Dyspnea on exertion    Ganglion cyst of wrist, right    a.) s/p excision 04/2011   Hepatic steatosis    History of 2019 novel coronavirus disease (COVID-19)    History of bilateral cataract extraction  2018   Hyperlipidemia    Hyperparathyroidism due to renal insufficiency (HCC)    Hypertension    Low testosterone in male    Nephrolithiasis    Obesity    OSA treated with BiPAP    Osteoarthritis    Peripheral edema    Right inguinal hernia    a.) s/p repair   Skin cancer    Type 2 diabetes mellitus with renal manifestations (HCC)     Current Outpatient Medications  Medication Sig Dispense Refill   acetaminophen  (TYLENOL ) 500 MG tablet Take 1,000 mg by mouth every 6 (six) hours as needed (shoulder pain).     albuterol  (PROVENTIL  HFA;VENTOLIN  HFA) 108 (90 Base) MCG/ACT inhaler Inhale 2 puffs into the lungs every 6 (six) hours as needed for wheezing or shortness of breath.     aspirin  EC 81 MG tablet Take by mouth.     calcitRIOL  (ROCALTROL ) 0.25 MCG capsule Take 0.25 mcg by mouth daily.     cetirizine (ZYRTEC) 10 MG chewable tablet Chew 10 mg by mouth daily as needed for allergies.     Continuous Blood Gluc Sensor (FREESTYLE LIBRE 14 DAY SENSOR) MISC by Does not apply route.     diazepam  (VALIUM ) 2 MG tablet Take 2 mg by mouth every 12 (twelve) hours as needed.     fluticasone  (FLONASE ) 50 MCG/ACT nasal spray as needed.     furosemide  (LASIX ) 40 MG tablet Take 2 tablets (80 mg total) by mouth 2 (two) times  daily. On non dialysis days, Sunday, Tuesday, Thursday, Saturday and Sunday 120 tablet 0   GLOBAL INJECT EASE INSULIN  SYR 31G X 5/16 0.5 ML MISC Inject into the skin.     glucose blood (ONETOUCH ULTRA) test strip TEST BLOOD SUGAR 4 TIMES DAILY     insulin  aspart (NOVOLOG ) 100 UNIT/ML injection Inject 14 Units into the skin 3 (three) times daily with meals. (Patient taking differently: Inject 35-45 Units into the skin 3 (three) times daily with meals. 45 units breakfast 35 units lunch 45 units supper) 10 mL 11   insulin  detemir (LEVEMIR ) 100 UNIT/ML injection Inject 0.5 mLs (50 Units total) into the skin at bedtime. (Patient taking differently: Inject 35 Units into the skin at bedtime.) 10 mL 1   isosorbide  mononitrate (IMDUR ) 30 MG 24 hr tablet Take 1 tablet (30 mg total) by mouth daily. 30 tablet 2   Lancets (ONETOUCH DELICA PLUS LANCET30G) MISC USE 1 LANCET 4 TIMES DAILY     lovastatin (MEVACOR) 40 MG tablet Take 40 mg by mouth daily with supper.     metoprolol  succinate (TOPROL -XL) 50 MG 24 hr tablet Take 2 tablets (100 mg total) by mouth daily. Do not take on Monday, Wednesday and Friday on dialysis days 120 tablet 0   midodrine (PROAMATINE) 10 MG tablet Take 10 mg by mouth 3 (three) times a week. On dialysis days and PRN on non-dialysis days     mupirocin  ointment (BACTROBAN ) 2 % Apply 1 Application topically 2 (two) times daily. 15 g 0   nitroGLYCERIN  (NITROSTAT ) 0.4 MG SL tablet Place 1 tablet (0.4 mg total) under the tongue every 5 (five) minutes as needed for chest pain. 20 tablet 1   sacubitril -valsartan  (ENTRESTO ) 24-26 MG Take 1 tablet by mouth 2 (two) times daily. 60 tablet 2   sertraline  (ZOLOFT ) 50 MG tablet Take 50 mg by mouth at bedtime.     tamsulosin  (FLOMAX ) 0.4 MG CAPS capsule TAKE (1) CAPSULE BY MOUTH EVERY DAY 30 capsule 11  No current facility-administered medications for this visit.   Vitals:   02/10/24 1030  BP: 135/60  Pulse: 65  SpO2: 96%  Weight: (!) 310 lb  (140.6 kg)   Wt Readings from Last 3 Encounters:  02/10/24 (!) 310 lb (140.6 kg)  02/03/24 (!) 305 lb 4 oz (138.5 kg)  12/29/23 300 lb (136.1 kg)   Lab Results  Component Value Date   CREATININE 1.98 (H) 12/29/2023   CREATININE 2.52 (H) 05/29/2023   CREATININE 2.74 (H) 05/28/2023    PHYSICAL EXAM:  General: Well appearing.  Cor: No JVD. Regular rhythm, rate.  Lungs: clear Abdomen: soft, nontender, nondistended. Extremities: trace pitting edema bilateral lower legs Neuro:. Affect pleasant   ECG: not done   ASSESSMENT & PLAN:  1: NICM with mildly reduced ejection fraction- - suspect due to HTN/ DM as cath showed nonobstructive CAD - NYHA Clarke III - euvolemic - weighing daily; reminded to call for an overnight weight gain of > 2 pounds or a weekly weight gain of >5 pounds - weight up 4 pounds from last visit here 4 months ago - continues on dialysis M, W, F - Echo 12/17/22: EF 50-55% along with mild LAE.  - Echo 07/09/22: EF of 45% along with moderate LVH.  - Echo 02/26/22: EF of 55-60% along with mild LVH, LAE and trivial James.  - Echo 05/26/23: EF 30-35% with moderate LVH, mild James - continue furosemide  80mg  BID on non-dialysis days; reports getting great response when takes diuretic - continue metoprolol  succinate 50mg  BID on non-dialysis days - stop entresto  due to continued hypotension/ dizziness - renal function limits other GDMT therapy - not adding salt and wife reads food labels for sodium content and understands to keep daily intake to 2000mg  - tries to keep daily fluid intake to 40 ounces - saw pulmonology Burnie) 06/24 - saw HF provider (Alluri) 06/25 - wearing compression socks daily with removal at bedtime - BNP 05/26/23 was 1047.2   2: HTN- - BP 135/60 - has midodrine 10mg  that he takes on dialysis days - saw PCP (Feldpausch) 07/25 - BMP 12/29/23 reviewed: sodium 137, potassium 3.6, creatinine 1.98 & GFR 33  3: DM with CKD/ ESRD- - A1c 05/26/23 was  7.6% - saw nephrology Geoffry) 05/25; will reach out to Dr Marcelino regarding patient's symptoms - saw endocrinology (Solum) 06/25 - saw vascular (Dew) 07/25 - had percutaneous transluminal angioplasty of the distal forearm cephalic vein with 5 mm diameter by 6 cm in length Lutonix drug-coated angioplasty balloon 01/13/23  4: Non-obstructive CAD- - saw cardiology Renata) 03/25 - LHC 12/31/21:    Mid LAD lesion is 60% stenosed.   Prox RCA lesion is 20% stenosed.   Dist RCA lesion is 20% stenosed.   Ost Cx to Prox Cx lesion is 20% stenosed.   The left ventricular systolic function is normal.   LV end diastolic pressure is moderately elevated.   The left ventricular ejection fraction is greater than 65% by visual estimate.  Patient has significant elevated end-diastolic pressure  5: Atrial fibrillation- - continue apixaban 2.5mg  BID - rate controlled and in regular rhythm   Offered, again, to make PRN appointments since he's following with HF provider at [facility]. They prefer to continue coming at this time. Return in 4 months, sooner if needed.   James DELENA Class, FNP 02/10/24

## 2024-02-10 NOTE — Patient Instructions (Signed)
 Medication Changes:  DISCONTINUE ENTRESTO    Follow-Up in: 4 MONTHS WITH ELLOUISE CLASS, FNP.  At the [facility], you and your health needs are our priority. We have a designated team specialized in the treatment of Heart Failure. This Care Team includes your primary Heart Failure Specialized Cardiologist (physician), Advanced Practice Providers (APPs- Physician Assistants and Nurse Practitioners), and Pharmacist who all work together to provide you with the care you need, when you need it.   You may see any of the following providers on your designated Care Team at your next follow up:  Dr. Toribio Fuel Dr. Ezra Shuck Dr. Ria Commander Dr. Odis Brownie ELLOUISE CLASS, FNP Jaun Bash, RPH-CPP  Please be sure to bring in all your medications bottles to every appointment.   Need to Contact Us :  If you have any questions or concerns before your next appointment please send us  a message through [place] or call our office at [phone].    TO LEAVE A MESSAGE FOR THE NURSE SELECT OPTION 2, PLEASE LEAVE A MESSAGE INCLUDING: YOUR NAME DATE OF BIRTH CALL BACK NUMBER REASON FOR CALL**this is important as we prioritize the call backs  YOU WILL RECEIVE A CALL BACK THE SAME DAY AS LONG AS YOU CALL BEFORE 4:00 PM

## 2024-02-12 DIAGNOSIS — E1121 Type 2 diabetes mellitus with diabetic nephropathy: Secondary | ICD-10-CM | POA: Diagnosis not present

## 2024-02-19 DIAGNOSIS — N186 End stage renal disease: Secondary | ICD-10-CM | POA: Diagnosis not present

## 2024-02-19 DIAGNOSIS — Z992 Dependence on renal dialysis: Secondary | ICD-10-CM | POA: Diagnosis not present

## 2024-02-20 DIAGNOSIS — D509 Iron deficiency anemia, unspecified: Secondary | ICD-10-CM | POA: Diagnosis not present

## 2024-02-20 DIAGNOSIS — Z992 Dependence on renal dialysis: Secondary | ICD-10-CM | POA: Diagnosis not present

## 2024-02-20 DIAGNOSIS — N186 End stage renal disease: Secondary | ICD-10-CM | POA: Diagnosis not present

## 2024-03-02 ENCOUNTER — Encounter: Admitting: Family

## 2024-03-03 DIAGNOSIS — I480 Paroxysmal atrial fibrillation: Secondary | ICD-10-CM | POA: Diagnosis not present

## 2024-03-03 DIAGNOSIS — I5020 Unspecified systolic (congestive) heart failure: Secondary | ICD-10-CM | POA: Diagnosis not present

## 2024-03-03 DIAGNOSIS — Z992 Dependence on renal dialysis: Secondary | ICD-10-CM | POA: Diagnosis not present

## 2024-03-03 DIAGNOSIS — I10 Essential (primary) hypertension: Secondary | ICD-10-CM | POA: Diagnosis not present

## 2024-03-03 DIAGNOSIS — N186 End stage renal disease: Secondary | ICD-10-CM | POA: Diagnosis not present

## 2024-03-14 DIAGNOSIS — E1121 Type 2 diabetes mellitus with diabetic nephropathy: Secondary | ICD-10-CM | POA: Diagnosis not present

## 2024-03-21 DIAGNOSIS — Z992 Dependence on renal dialysis: Secondary | ICD-10-CM | POA: Diagnosis not present

## 2024-03-21 DIAGNOSIS — N186 End stage renal disease: Secondary | ICD-10-CM | POA: Diagnosis not present

## 2024-03-22 DIAGNOSIS — N186 End stage renal disease: Secondary | ICD-10-CM | POA: Diagnosis not present

## 2024-03-22 DIAGNOSIS — Z992 Dependence on renal dialysis: Secondary | ICD-10-CM | POA: Diagnosis not present

## 2024-03-27 DIAGNOSIS — G4733 Obstructive sleep apnea (adult) (pediatric): Secondary | ICD-10-CM | POA: Diagnosis not present

## 2024-04-06 DIAGNOSIS — E1142 Type 2 diabetes mellitus with diabetic polyneuropathy: Secondary | ICD-10-CM | POA: Diagnosis not present

## 2024-04-06 DIAGNOSIS — E1121 Type 2 diabetes mellitus with diabetic nephropathy: Secondary | ICD-10-CM | POA: Diagnosis not present

## 2024-04-06 DIAGNOSIS — N186 End stage renal disease: Secondary | ICD-10-CM | POA: Diagnosis not present

## 2024-04-06 DIAGNOSIS — Z794 Long term (current) use of insulin: Secondary | ICD-10-CM | POA: Diagnosis not present

## 2024-04-06 DIAGNOSIS — N184 Chronic kidney disease, stage 4 (severe): Secondary | ICD-10-CM | POA: Diagnosis not present

## 2024-04-06 DIAGNOSIS — Z992 Dependence on renal dialysis: Secondary | ICD-10-CM | POA: Diagnosis not present

## 2024-04-06 DIAGNOSIS — E1165 Type 2 diabetes mellitus with hyperglycemia: Secondary | ICD-10-CM | POA: Diagnosis not present

## 2024-04-06 DIAGNOSIS — I10 Essential (primary) hypertension: Secondary | ICD-10-CM | POA: Diagnosis not present

## 2024-04-06 DIAGNOSIS — E66813 Obesity, class 3: Secondary | ICD-10-CM | POA: Diagnosis not present

## 2024-04-06 DIAGNOSIS — E1122 Type 2 diabetes mellitus with diabetic chronic kidney disease: Secondary | ICD-10-CM | POA: Diagnosis not present

## 2024-04-15 ENCOUNTER — Encounter: Payer: Self-pay | Admitting: Urology

## 2024-04-15 DIAGNOSIS — Z794 Long term (current) use of insulin: Secondary | ICD-10-CM | POA: Diagnosis not present

## 2024-04-15 DIAGNOSIS — E1140 Type 2 diabetes mellitus with diabetic neuropathy, unspecified: Secondary | ICD-10-CM | POA: Diagnosis not present

## 2024-04-15 DIAGNOSIS — B351 Tinea unguium: Secondary | ICD-10-CM | POA: Diagnosis not present

## 2024-04-20 DIAGNOSIS — N186 End stage renal disease: Secondary | ICD-10-CM | POA: Diagnosis not present

## 2024-04-20 DIAGNOSIS — Z992 Dependence on renal dialysis: Secondary | ICD-10-CM | POA: Diagnosis not present

## 2024-04-21 DIAGNOSIS — N186 End stage renal disease: Secondary | ICD-10-CM | POA: Diagnosis not present

## 2024-04-21 DIAGNOSIS — Z992 Dependence on renal dialysis: Secondary | ICD-10-CM | POA: Diagnosis not present

## 2024-04-21 DIAGNOSIS — Z23 Encounter for immunization: Secondary | ICD-10-CM | POA: Diagnosis not present

## 2024-05-11 DIAGNOSIS — F321 Major depressive disorder, single episode, moderate: Secondary | ICD-10-CM | POA: Diagnosis not present

## 2024-05-11 DIAGNOSIS — E7800 Pure hypercholesterolemia, unspecified: Secondary | ICD-10-CM | POA: Diagnosis not present

## 2024-05-11 DIAGNOSIS — J309 Allergic rhinitis, unspecified: Secondary | ICD-10-CM | POA: Diagnosis not present

## 2024-05-11 DIAGNOSIS — M25511 Pain in right shoulder: Secondary | ICD-10-CM | POA: Diagnosis not present

## 2024-05-11 DIAGNOSIS — E1140 Type 2 diabetes mellitus with diabetic neuropathy, unspecified: Secondary | ICD-10-CM | POA: Diagnosis not present

## 2024-05-11 DIAGNOSIS — I700 Atherosclerosis of aorta: Secondary | ICD-10-CM | POA: Diagnosis not present

## 2024-05-11 DIAGNOSIS — N2581 Secondary hyperparathyroidism of renal origin: Secondary | ICD-10-CM | POA: Diagnosis not present

## 2024-05-11 DIAGNOSIS — F419 Anxiety disorder, unspecified: Secondary | ICD-10-CM | POA: Diagnosis not present

## 2024-05-11 DIAGNOSIS — I509 Heart failure, unspecified: Secondary | ICD-10-CM | POA: Diagnosis not present

## 2024-05-11 DIAGNOSIS — I10 Essential (primary) hypertension: Secondary | ICD-10-CM | POA: Diagnosis not present

## 2024-05-11 DIAGNOSIS — E1122 Type 2 diabetes mellitus with diabetic chronic kidney disease: Secondary | ICD-10-CM | POA: Diagnosis not present

## 2024-05-18 DIAGNOSIS — E7800 Pure hypercholesterolemia, unspecified: Secondary | ICD-10-CM | POA: Diagnosis not present

## 2024-05-21 DIAGNOSIS — Z992 Dependence on renal dialysis: Secondary | ICD-10-CM | POA: Diagnosis not present

## 2024-05-21 DIAGNOSIS — N186 End stage renal disease: Secondary | ICD-10-CM | POA: Diagnosis not present

## 2024-05-26 DIAGNOSIS — Z992 Dependence on renal dialysis: Secondary | ICD-10-CM | POA: Diagnosis not present

## 2024-05-26 DIAGNOSIS — I509 Heart failure, unspecified: Secondary | ICD-10-CM | POA: Diagnosis not present

## 2024-05-26 DIAGNOSIS — E8779 Other fluid overload: Secondary | ICD-10-CM | POA: Diagnosis not present

## 2024-05-26 DIAGNOSIS — D509 Iron deficiency anemia, unspecified: Secondary | ICD-10-CM | POA: Diagnosis not present

## 2024-05-26 DIAGNOSIS — N186 End stage renal disease: Secondary | ICD-10-CM | POA: Diagnosis not present

## 2024-06-02 ENCOUNTER — Telehealth (INDEPENDENT_AMBULATORY_CARE_PROVIDER_SITE_OTHER): Payer: Self-pay

## 2024-06-02 NOTE — Telephone Encounter (Addendum)
 Patient called in stated he went to dialysis this morning and was unable to get it as they told him his catheter was broken or cracked he reports he is unable to return until this has been repaired. Forwarded to Scheduling for review and follow up

## 2024-06-02 NOTE — Telephone Encounter (Signed)
 Spoke with the patient and he is scheduled with Dr. Marea for a permcath exchange on 06/03/24 with a 1:30 pm arrival time to the [facility]. Pre-procedure instructions were discussed and will be sent to Mychart.

## 2024-06-03 ENCOUNTER — Ambulatory Visit
Admission: RE | Admit: 2024-06-03 | Discharge: 2024-06-03 | Disposition: A | Discharge status: Home / Self Care | Attending: Vascular Surgery | Admitting: Vascular Surgery

## 2024-06-03 ENCOUNTER — Encounter: Payer: Self-pay | Admitting: Vascular Surgery

## 2024-06-03 ENCOUNTER — Encounter
Admission: RE | Disposition: A | Discharge status: Home / Self Care | Payer: Self-pay | Source: Home / Self Care | Attending: Vascular Surgery

## 2024-06-03 ENCOUNTER — Other Ambulatory Visit: Payer: Self-pay

## 2024-06-03 DIAGNOSIS — I2510 Atherosclerotic heart disease of native coronary artery without angina pectoris: Secondary | ICD-10-CM | POA: Diagnosis not present

## 2024-06-03 DIAGNOSIS — Z992 Dependence on renal dialysis: Secondary | ICD-10-CM | POA: Diagnosis not present

## 2024-06-03 DIAGNOSIS — T8249XA Other complication of vascular dialysis catheter, initial encounter: Secondary | ICD-10-CM | POA: Diagnosis not present

## 2024-06-03 DIAGNOSIS — T8241XA Breakdown (mechanical) of vascular dialysis catheter, initial encounter: Secondary | ICD-10-CM | POA: Insufficient documentation

## 2024-06-03 DIAGNOSIS — E1122 Type 2 diabetes mellitus with diabetic chronic kidney disease: Secondary | ICD-10-CM | POA: Insufficient documentation

## 2024-06-03 DIAGNOSIS — Y839 Surgical procedure, unspecified as the cause of abnormal reaction of the patient, or of later complication, without mention of misadventure at the time of the procedure: Secondary | ICD-10-CM | POA: Diagnosis not present

## 2024-06-03 DIAGNOSIS — N186 End stage renal disease: Secondary | ICD-10-CM | POA: Diagnosis not present

## 2024-06-03 DIAGNOSIS — Z79899 Other long term (current) drug therapy: Secondary | ICD-10-CM | POA: Insufficient documentation

## 2024-06-03 DIAGNOSIS — I120 Hypertensive chronic kidney disease with stage 5 chronic kidney disease or end stage renal disease: Secondary | ICD-10-CM | POA: Diagnosis not present

## 2024-06-03 HISTORY — PX: DIALYSIS/PERMA CATHETER INSERTION: CATH118288

## 2024-06-03 LAB — POCT I-STAT EG7
Acid-base deficit: 3 mmol/L — ABNORMAL HIGH (ref 0.0–2.0)
Bicarbonate: 23.4 mmol/L (ref 20.0–28.0)
Calcium, Ion: 1.19 mmol/L (ref 1.15–1.40)
HCT: 43 % (ref 39.0–52.0)
Hemoglobin: 14.6 g/dL (ref 13.0–17.0)
O2 Saturation: 47 %
Potassium: 3.7 mmol/L (ref 3.5–5.1)
Sodium: 141 mmol/L (ref 135–145)
TCO2: 25 mmol/L (ref 22–32)
pCO2, Ven: 45.7 mmHg (ref 44–60)
pH, Ven: 7.317 (ref 7.25–7.43)
pO2, Ven: 28 mmHg — CL (ref 32–45)

## 2024-06-03 LAB — GLUCOSE, CAPILLARY
Glucose-Capillary: 146 mg/dL — ABNORMAL HIGH (ref 70–99)
Glucose-Capillary: 185 mg/dL — ABNORMAL HIGH (ref 70–99)

## 2024-06-03 SURGERY — DIALYSIS/PERMA CATHETER INSERTION
Anesthesia: Moderate Sedation

## 2024-06-03 MED ORDER — HEPARIN SODIUM (PORCINE) 10000 UNIT/ML IJ SOLN
INTRAMUSCULAR | Status: DC | PRN
Start: 2024-06-03 — End: 2024-06-03
  Administered 2024-06-03: 10000 [IU]

## 2024-06-03 MED ORDER — FENTANYL CITRATE (PF) 50 MCG/ML IJ SOSY
PREFILLED_SYRINGE | INTRAMUSCULAR | Status: AC
  Filled 2024-06-03: qty 1

## 2024-06-03 MED ORDER — DEXTROSE 50 % IV SOLN
12.5000 g | Freq: Once | INTRAVENOUS | Status: AC
  Administered 2024-06-03: 12.5 g via INTRAVENOUS

## 2024-06-03 MED ORDER — METHYLPREDNISOLONE SODIUM SUCC 125 MG IJ SOLR: 125.0000 mg | Freq: Once | INTRAMUSCULAR | Status: DC | PRN

## 2024-06-03 MED ORDER — CEFAZOLIN SODIUM-DEXTROSE 1-4 GM/50ML-% IV SOLN
INTRAVENOUS | Status: AC
  Filled 2024-06-03: qty 50

## 2024-06-03 MED ORDER — LIDOCAINE-EPINEPHRINE (PF) 1 %-1:200000 IJ SOLN
INTRAMUSCULAR | Status: DC | PRN
Start: 2024-06-03 — End: 2024-06-03
  Administered 2024-06-03: 20 mL

## 2024-06-03 MED ORDER — SODIUM CHLORIDE 0.9 % IV SOLN: INTRAVENOUS | Status: DC

## 2024-06-03 MED ORDER — MIDAZOLAM HCL 2 MG/2ML IJ SOLN
INTRAMUSCULAR | Status: AC
  Filled 2024-06-03: qty 2

## 2024-06-03 MED ORDER — HEPARIN SODIUM (PORCINE) 1000 UNIT/ML IJ SOLN
INTRAMUSCULAR | Status: AC
Start: 2024-06-03 — End: 2024-06-03
  Filled 2024-06-03: qty 10

## 2024-06-03 MED ORDER — DEXTROSE 50 % IV SOLN
INTRAVENOUS | Status: AC
  Filled 2024-06-03: qty 50

## 2024-06-03 MED ORDER — HYDROMORPHONE HCL 1 MG/ML IJ SOLN: 1.0000 mg | Freq: Once | INTRAMUSCULAR | Status: DC | PRN

## 2024-06-03 MED ORDER — HEPARIN SODIUM (PORCINE) 10000 UNIT/ML IJ SOLN
INTRAMUSCULAR | Status: AC
  Filled 2024-06-03: qty 1

## 2024-06-03 MED ORDER — FENTANYL CITRATE (PF) 100 MCG/2ML IJ SOLN
INTRAMUSCULAR | Status: DC | PRN
  Administered 2024-06-03: 25 ug via INTRAVENOUS

## 2024-06-03 MED ORDER — FAMOTIDINE 20 MG PO TABS: 40.0000 mg | ORAL_TABLET | Freq: Once | ORAL | Status: DC | PRN

## 2024-06-03 MED ORDER — MIDAZOLAM HCL (PF) 2 MG/2ML IJ SOLN
INTRAMUSCULAR | Status: DC | PRN
  Administered 2024-06-03: 1 mg via INTRAVENOUS

## 2024-06-03 MED ORDER — ONDANSETRON HCL 4 MG/2ML IJ SOLN: 4.0000 mg | Freq: Four times a day (QID) | INTRAMUSCULAR | Status: DC | PRN

## 2024-06-03 MED ORDER — HEPARIN (PORCINE) IN NACL 1000-0.9 UT/500ML-% IV SOLN
INTRAVENOUS | Status: DC | PRN
Start: 2024-06-03 — End: 2024-06-03
  Administered 2024-06-03: 500 mL

## 2024-06-03 MED ORDER — CEFAZOLIN SODIUM-DEXTROSE 1-4 GM/50ML-% IV SOLN
1.0000 g | INTRAVENOUS | Status: AC
  Administered 2024-06-03: 1 g via INTRAVENOUS

## 2024-06-03 MED ORDER — DIPHENHYDRAMINE HCL 50 MG/ML IJ SOLN: 50.0000 mg | Freq: Once | INTRAMUSCULAR | Status: DC | PRN

## 2024-06-03 MED ORDER — MIDAZOLAM HCL 2 MG/ML PO SYRP: 8.0000 mg | ORAL_SOLUTION | Freq: Once | ORAL | Status: DC | PRN

## 2024-06-03 SURGICAL SUPPLY — 6 items
BIOPATCH RED 1 DISK 7.0 (GAUZE/BANDAGES/DRESSINGS) IMPLANT
CATH PALINDROME-P 19CM W/VT (CATHETERS) IMPLANT
GUIDEWIRE SUPER STIFF .035X180 (WIRE) IMPLANT
PACK ANGIOGRAPHY (CUSTOM PROCEDURE TRAY) ×1 IMPLANT
SUT MNCRL AB 4-0 PS2 18 (SUTURE) IMPLANT
SUT PROLENE 0 CT 1 30 (SUTURE) IMPLANT

## 2024-06-03 NOTE — Telephone Encounter (Signed)
 Olam, Nurse from [facility] called at this time in reference to patient having a slit in on of the limbs of his CVC and he needed a replacement ASAP, she sated all paperwork has been sent over at this time.   Patient is currently on the schedule today 06/03/24 @ 1430 to complete the procedure.

## 2024-06-03 NOTE — Op Note (Signed)
 OPERATIVE NOTE    PRE-OPERATIVE DIAGNOSIS: 1. ESRD 2. Non-functional permcath  POST-OPERATIVE DIAGNOSIS: same as above  PROCEDURE: Fluoroscopic guidance for placement of catheter Placement of a 19 cm tip to cuff tunneled hemodialysis catheter via the right internal jugular vein and removal of previous catheter  SURGEON: Selinda Gu, MD  ANESTHESIA:  Local with moderate conscious sedation for 10 minutes using 1 mg of Versed  and 25 mcg of Fentanyl   ESTIMATED BLOOD LOSS: 5 cc  FINDING(S): none  SPECIMEN(S):  None  INDICATIONS:   Patient is a 83 y.o.male who presents with non-functional dialysis catheter and ESRD.  The patient needs long term dialysis access for their ESRD, and a Permcath is necessary.  Risks and benefits are discussed and informed consent is obtained.    DESCRIPTION: After obtaining full informed written consent, the patient was brought back to the vascular suite. The patient received moderate conscious sedation during a face-to-face encounter with me present throughout the entire procedure and supervising the RN monitoring the vital signs, pulse oximetry, telemetry, and mental status throughout the entire procedure. The patient's existing catheter, right neck and chest were sterilely prepped and draped in a sterile surgical field was created.  The existing catheter was dissected free from the fibrous sheath securing the cuff with hemostats and blunt dissection.  A wire was placed. The existing catheter was then removed and the wire used to keep venous access. I selected a 19 cm tip to cuff tunneled dialysis catheter.  Using fluoroscopic guidance the catheter tips were parked in the right atrium. The appropriate distal connectors were placed. It withdrew blood well and flushed easily with heparinized saline and a concentrated heparin  solution was then placed. It was secured to the chest wall with 2 Prolene sutures. A 4-0 Monocryl pursestring suture was placed around the exit  site. Sterile dressings were placed. The patient tolerated the procedure well and was taken to the recovery room in stable condition.  COMPLICATIONS: None  CONDITION: Stable  Selinda Gu 06/03/2024 3:49 PM   This note was created with Dragon Medical transcription system. Any errors in dictation are purely unintentional.

## 2024-06-03 NOTE — H&P (Signed)
 [facility] Admission History & Physical  MRN : [id number]  James Clarke is a 83 y.o. (June 20, 1941) male who presents with chief complaint of No chief complaint on file. [name]  History of Present Illness:  I am asked to evaluate the patient by the dialysis center. The patient was sent here because they were unable to achieve adequate dialysis yesterday. Furthermore the Center states they were unable to aspirate either lumen of the catheter yesterday.  This problem has been getting worse for about 1 week. The patient is unaware of any other change.   Patient denies pain or tenderness overlying the access.  There is no pain with dialysis.  Patient denies fevers or shaking chills while on dialysis.    There have multiple any past interventions and declots of his multiple different access.  The patient is not chronically hypotensive on dialysis.   Current Facility-Administered Medications  Medication Dose Route Frequency Provider Last Rate Last Admin   0.9 %  sodium chloride  infusion   Intravenous Continuous Brown, Fallon E, NP       ceFAZolin  (ANCEF ) IVPB 1 g/50 mL premix  1 g Intravenous 30 min Pre-Op  Brown, Fallon E, NP       dextrose  50 % solution 12.5 g  12.5 g Intravenous Once Sabrine Clarke S, MD       diphenhydrAMINE  (BENADRYL ) injection 50 mg  50 mg Intravenous Once PRN Brown, Fallon E, NP       famotidine  (PEPCID ) tablet 40 mg  40 mg Oral Once PRN Brown, Fallon E, NP       HYDROmorphone  (DILAUDID ) injection 1 mg  1 mg Intravenous Once PRN Brown, Fallon E, NP       methylPREDNISolone  sodium succinate (SOLU-MEDROL ) 125 mg/2 mL injection 125 mg  125 mg Intravenous Once PRN Brown, Fallon E, NP       midazolam  (VERSED ) 2 MG/ML syrup 8 mg  8 mg Oral Once PRN Brown, Fallon E, NP       ondansetron  (ZOFRAN ) injection 4 mg  4 mg Intravenous Q6H PRN Brown, Fallon E, NP         Past Surgical History:  Procedure Laterality Date   A/V FISTULAGRAM Left 01/13/2023   Procedure:  A/V Fistulagram;  Surgeon: Marea Selinda RAMAN, MD;  Location: [facility];  Service: Cardiovascular;  Laterality: Left;   A/V SHUNT INTERVENTION Left 04/11/2023   Procedure: A/V SHUNT INTERVENTION;  Surgeon: Marea Selinda RAMAN, MD;  Location: [facility];  Service: Cardiovascular;  Laterality: Left;   APPENDECTOMY     AV FISTULA PLACEMENT Left 09/19/2022   Procedure: ARTERIOVENOUS (AV) FISTULA CREATION (RADIO CEPHALIC);  Surgeon: Marea Selinda RAMAN, MD;  Location: [facility];  Service: Vascular;  Laterality: Left;   CATARACT EXTRACTION W/PHACO Left 05/20/2017   Procedure: CATARACT EXTRACTION PHACO AND INTRAOCULAR LENS PLACEMENT (IOC);  Surgeon: Jaye Fallow, MD;  Location: [facility];  Service: Ophthalmology;  Laterality: Left;  US  00:32.0 AP% 15.5 CDE 4.97 Fluid Pack Lot # [id number]   CATARACT EXTRACTION W/PHACO Right 06/10/2017   Procedure: CATARACT EXTRACTION PHACO AND INTRAOCULAR LENS PLACEMENT (IOC);  Surgeon: Jaye Fallow, MD;  Location: [facility];  Service: Ophthalmology;  Laterality: Right;  US   00:51 AP% 15.4 CDE 7.95 Fluid pack lot # [id number]   CHOLECYSTECTOMY     COLONOSCOPY     x3   CYSTOSCOPY W/ RETROGRADES Right 08/13/2019   Procedure: CYSTOSCOPY WITH RETROGRADE PYELOGRAM;  Surgeon: Francisca Redell BROCKS, MD;  Location:  [facility];  Service: Urology;  Laterality: Right;   CYSTOSCOPY WITH STENT PLACEMENT Right 07/26/2019   Procedure: CYSTOSCOPY WITH STENT PLACEMENT;  Surgeon: Carolee Sherwood JONETTA DOUGLAS, MD;  Location: [facility];  Service: Urology;  Laterality: Right;   CYSTOSCOPY/URETEROSCOPY/HOLMIUM LASER/STENT PLACEMENT Right 08/13/2019   Procedure: CYSTOSCOPY/URETEROSCOPY/HOLMIUM LASER/STENT EXCHANGE;  Surgeon: Francisca Redell BROCKS, MD;  Location: [facility];  Service: Urology;  Laterality: Right;   CYSTOSCOPY/URETEROSCOPY/HOLMIUM LASER/STENT PLACEMENT Left 02/27/2022   Procedure: CYSTOSCOPY/URETEROSCOPY/HOLMIUM LASER/STENT PLACEMENT;  Surgeon: Twylla Glendia BROCKS, MD;  Location: [facility];   Service: Urology;  Laterality: Left;   DIALYSIS/PERMA CATHETER INSERTION N/A 04/11/2023   Procedure: DIALYSIS/PERMA CATHETER INSERTION;  Surgeon: Marea Selinda RAMAN, MD;  Location: [facility];  Service: Cardiovascular;  Laterality: N/A;   GANGLION CYST EXCISION Right    INGUINAL HERNIA REPAIR Right    LEFT HEART CATH AND CORONARY ANGIOGRAPHY N/A 12/31/2021   Procedure: LEFT HEART CATH AND CORONARY ANGIOGRAPHY;  Surgeon: Hester Wolm PARAS, MD;  Location: [facility];  Service: Cardiovascular;  Laterality: N/A;   SHOULDER ARTHROSCOPY WITH SUBACROMIAL DECOMPRESSION AND OPEN ROTATOR C Right 09/11/2020   Procedure: Right shoulder arthroscopic rotator cuff repair vs Regeneten patch application, subacromial decompression, and biceps tenodesis - Krystal Doyne to Assist;  Surgeon: Tobie Priest, MD;  Location: [facility];  Service: Orthopedics;  Laterality: Right;   TEMPORARY DIALYSIS CATHETER N/A 03/05/2022   Procedure: TEMPORARY DIALYSIS CATHETER;  Surgeon: Jama Cordella MATSU, MD;  Location: [facility];  Service: Cardiovascular;  Laterality: N/A;   TONSILLECTOMY     URETEROSCOPY WITH HOLMIUM LASER LITHOTRIPSY Left 02/27/2022   Procedure: URETEROSCOPY WITH HOLMIUM LASER LITHOTRIPSY;  Surgeon: Twylla Glendia BROCKS, MD;  Location: [facility];  Service: Urology;  Laterality: Left;   WRIST FRACTURE SURGERY Right      Social History   Tobacco Use   Smoking status: Never    Passive exposure: Never   Smokeless tobacco: Never  Vaping Use   Vaping status: Never Used  Substance Use Topics   Alcohol use: No   Drug use: Not Currently     Family History  Problem Relation Age of Onset   Emphysema Mother    COPD Mother    Heart disease Mother    Brain cancer Father     No family history of bleeding or clotting disorders, autoimmune disease or porphyria  Allergies  Allergen Reactions   Codeine Nausea And Vomiting   Doxycycline    Erythromycin Rash     REVIEW OF SYSTEMS (Negative  unless checked)  Constitutional: [] Weight loss  [] Fever  [] Chills Cardiac: [] Chest pain   [] Chest pressure   [] Palpitations   [] Shortness of breath when laying flat   [] Shortness of breath at rest   [x] Shortness of breath with exertion. Vascular:  [] Pain in legs with walking   [] Pain in legs at rest   [] Pain in legs when laying flat   [] Claudication   [] Pain in feet when walking  [] Pain in feet at rest  [] Pain in feet when laying flat   [] History of DVT   [] Phlebitis   [] Swelling in legs   [] Varicose veins   [] Non-healing ulcers Pulmonary:   [] Uses home oxygen   [] Productive cough   [] Hemoptysis   [] Wheeze  [x] COPD   [] Asthma Neurologic:  [] Dizziness  [] Blackouts   [] Seizures   [] History of stroke   [] History of TIA  [] Aphasia   [] Temporary blindness   [] Dysphagia   [] Weakness or numbness in arms   []   Weakness or numbness in legs Musculoskeletal:  [] Arthritis   [] Joint swelling   [x] Joint pain   [] Low back pain Hematologic:  [] Easy bruising  [] Easy bleeding   [] Hypercoagulable state   [x] Anemic  [] Hepatitis Gastrointestinal:  [] Blood in stool   [] Vomiting blood  [] Gastroesophageal reflux/heartburn   [] Difficulty swallowing. Genitourinary:  [x] Chronic kidney disease   [] Difficult urination  [] Frequent urination  [] Burning with urination   [] Blood in urine Skin:  [] Rashes   [] Ulcers   [] Wounds Psychological:  [] History of anxiety   []  History of major depression.  Physical Examination  Vitals:   06/03/24 1415  BP: (!) 142/86  Pulse: 73  Resp: (!) 21  Temp: (!) 97.1 F (36.2 C)  TempSrc: Temporal  SpO2: 91%   There is no height or weight on file to calculate BMI. Gen: WD/WN, NAD Head: /AT, No temporalis wasting.  Ear/Nose/Throat: Hearing grossly intact, nares w/o erythema or drainage, oropharynx w/o Erythema/Exudate,  Eyes: Conjunctiva clear, sclera non-icteric Neck: Trachea midline.  No JVD.  Pulmonary:  Good air movement, respirations not labored, no use of accessory muscles.   Cardiac: RRR, normal S1, S2. Vascular: right tunneled catheter without tenderness or drainage, but catheter appears cracked Vessel Right Left  Radial Palpable Palpable  Gastrointestinal: soft, non-tender/non-distended. No guarding/reflex.  Musculoskeletal: M/S 5/5 throughout.  Extremities without ischemic changes.  No deformity or atrophy.  Neurologic: Sensation grossly intact in extremities.  Symmetrical.  Speech is fluent. Motor exam as listed above. Psychiatric: Judgment intact, Mood & affect appropriate for pt's clinical situation. Dermatologic: No rashes or ulcers noted.  No cellulitis or open wounds.    CBC Lab Results  Component Value Date   WBC 10.9 (H) 12/29/2023   HGB 11.2 (L) 12/29/2023   HCT 34.8 (L) 12/29/2023   MCV 94.1 12/29/2023   PLT 301 12/29/2023    BMET    Component Value Date/Time   NA 137 12/29/2023 1615   NA 141 04/10/2022 0000   NA 137 11/27/2013 0412   K 3.6 12/29/2023 1615   K 3.5 11/27/2013 0412   CL 99 12/29/2023 1615   CL 105 11/27/2013 0412   CO2 25 12/29/2023 1615   CO2 25 11/27/2013 0412   GLUCOSE 301 (H) 12/29/2023 1615   GLUCOSE 98 11/27/2013 0412   BUN 23 12/29/2023 1615   BUN 42 (H) 11/27/2013 0412   CREATININE 1.98 (H) 12/29/2023 1615   CREATININE 2.14 (H) 11/27/2013 0412   CALCIUM 8.1 (L) 12/29/2023 1615   CALCIUM 7.6 (L) 11/27/2013 0412   GFRNONAA 33 (L) 12/29/2023 1615   GFRNONAA 30 (L) 11/27/2013 0412   GFRAA 30 (L) 03/13/2020 1531   GFRAA 34 (L) 11/27/2013 0412   CrCl cannot be calculated (Patient's most recent lab result is older than the maximum 21 days allowed.).  COAG Lab Results  Component Value Date   INR 1.0 05/26/2023   INR 1.0 09/08/2020    Radiology No results found.  Assessment/Plan 1.  Complication dialysis device with thrombosis AV access:  Patient's tunneled catheter is thrombosed. The patient will undergo exchange of the catheter same venous access using interventional techniques.  The risks and  benefits were described to the patient.  All questions were answered.  The patient agrees to proceed with intervention.  2.  End-stage renal disease requiring hemodialysis:  Patient will continue dialysis therapy without further interruption if a successful exchange is not achieved then new site will be found for tunneled catheter placement. Dialysis has already been arranged since the  patient missed their previous session 3.  Hypertension:  Patient will continue medical management; nephrology is following no changes in oral medications. 4. Diabetes mellitus:  Glucose will be monitored and oral medications been held this morning once the patient has undergone the patient's procedure po intake will be reinitiated and again Accu-Cheks will be used to assess the blood glucose level and treat as needed. The patient will be restarted on the patient's usual hypoglycemic regime 5.  Coronary artery disease:  EKG will be monitored. Nitrates will be used if needed. The patient's oral cardiac medications will be continued.    Selinda Gu, MD  06/03/2024 2:39 PM

## 2024-06-04 ENCOUNTER — Encounter: Payer: Self-pay | Admitting: Vascular Surgery

## 2024-06-05 ENCOUNTER — Other Ambulatory Visit: Payer: Self-pay | Admitting: Urology

## 2024-06-07 ENCOUNTER — Telehealth (INDEPENDENT_AMBULATORY_CARE_PROVIDER_SITE_OTHER): Payer: Self-pay

## 2024-06-07 NOTE — Telephone Encounter (Signed)
 Patient spouse was notified with medical recommendations and verbalized understanding

## 2024-06-07 NOTE — Telephone Encounter (Signed)
 If they are clear, they are dissolvable but if colored they are not.  We typically do not have patients come in to removed stiches from a perm cath, that can be done at dialysis if necessary

## 2024-06-07 NOTE — Telephone Encounter (Signed)
 Patient spouse reach needing clarification if the stitches that were used are dissolvable or should the patient be scheduled to come in. Patient had perm cath insertion on 06/03/24. She was advise by dialysis tech that they could be removed in 2 weeks by them. Please Advise

## 2024-06-14 ENCOUNTER — Telehealth: Payer: Self-pay | Admitting: Family

## 2024-06-14 NOTE — Telephone Encounter (Signed)
 Called to confirm/remind patient of [facility] appointment [facility] on 06/15/[facility].   Appointment:   [x] Confirmed  [] Left mess   [] No answer/No voice mail  [] VM Full/unable to leave message  [] Phone not in service  Patient reminded to bring all medications and/or complete list.  Confirmed patient has transportation. Gave directions, instructed to utilize valet parking.

## 2024-06-14 NOTE — Progress Notes (Unsigned)
 Advanced Heart Failure Clinic Note     PCP: Jeffie Craze, MD Primary Cardiologist: Florencio Kava, MD / Lucyann Palma, PA (last seen 03/25) HF provider: Wilburn Fillers, MD (last seen 08/25; returns 12/25)  Chief Complaint: shortness of breath   HPI:  Mr Helbing is a 83 y/o male with a history of asthma, IDDM, hyperlipidemia, HTN, nonobstructive CAD, CKD, bell's palsy, sleep apnea and chronic heart failure. Had Percutaneous transluminal angioplasty of the distal forearm cephalic vein with 5 mm diameter by 6 cm in length Lutonix drug-coated angioplasty balloon 01/13/23. Started dialysis 09/24.  Echo 02/26/22: EF of 55-60% along with mild LVH, LAE and trivial MR.  Admitted 06/12/22 due to worsening shortness of breath. Patient states that he has had shortness of breath for several days but worse 1 day prior to his admission. He states that he slept in his recliner with his CPAP because he could not breathe laying flat. Cardiology & nephrology consults obtained. Diuretic changed. PT/OT evaluations done. Discharged after 8 days.   Echo 07/09/22: EF of 45% along with moderate LVH.  Patient was seen on 12/09/2022 by nephrology at which time he was started on IV Lasix  80 mg twice a week with oral Lasix  on the rest of the days.   Admitted 12/13/22 due to due to increased shortness of breath and chest pain. 2 weeks ago, he had a rapid worsening in his dyspnea on exertion and shortness of breath at rest. He has not had any increased urine output since increasing his diuretic regimen as instructed by his nephrologist.   Echo 12/17/22: EF 50-55% along with mild LAE.     Admitted 05/26/23 due to worsening SOB and new onset chest pain. Developed hypotension after being on dialysis for [redacted] weeks along with symptomatic dizziness at the end of dialysis. Dialysis day meds adjusted but symptoms persisted. Night prior to admission, chest pain developed that woke him up. No improvement with SL NTG. No change in  weight. CXR showing pulmonary congestion. First troponin was 300. Heparin  and IV lasix  given. Echo 05/26/23: EF 30-35% with moderate LVH, mild MR. Nephrology/ cardiology consulted and dialysis done.   Was in the ED 12/29/23 with LOC after a dialysis session. Was delayed in taking his midodrine dose and had syncopal event at home sitting on a barstool. Hit his head. EKG shows no evidence of arrhythmia or ischemia .CT head and cervical spine are negative for acute process.   Saw HF provider 06/25 where entresto  was cut in half due to continued dizziness. Continue holding entresto / metoprolol  on dialysis days.   Seen in [facility] 02/10/24 where entresto  was stopped due to dizziness.   He presents today, with his wife, for a HF follow-up visit with a chief complaint of moderate shortness of breath. Has associated fatigue, chest tightness, dizziness, weakness. Continues with dialysis 3 days / week and each time they remove 4-5 pounds. Weight is stable. Holds his metoprolol  on dialysis days and has noticed that his HR tends to get very fast >100 with little exertion especially on those days. He says that he can't walk up steps anymore because of his weakness. Blood pressure will drop 40 points from the start of dialysis to the end of the session. He takes his midodrine 1  hour before leaving dialysis to off set this.    Previous cardiac studies:  LHC 12/31/21:    Mid LAD lesion is 60% stenosed.   Prox RCA lesion is 20% stenosed.   Dist RCA lesion is  20% stenosed.   Ost Cx to Prox Cx lesion is 20% stenosed.   The left ventricular systolic function is normal.   LV end diastolic pressure is moderately elevated.   The left ventricular ejection fraction is greater than 65% by visual estimate.  Patient has significant elevated end-diastolic pressure  Stress test 06/10/23: no ischemia, low risk study  ROS: All systems negative except as listed in HPI, PMH and Problem List.  SH:  Social History   Socioeconomic  History   Marital status: Married    Spouse name: Not on file   Number of children: Not on file   Years of education: Not on file   Highest education level: Not on file  Occupational History   Not on file  Tobacco Use   Smoking status: Never    Passive exposure: Never   Smokeless tobacco: Never  Vaping Use   Vaping status: Never Used  Substance and Sexual Activity   Alcohol use: No   Drug use: Not Currently   Sexual activity: Yes    Birth control/protection: None  Other Topics Concern   Not on file  Social History Narrative   Not on file   Social Drivers of Health   Financial Resource Strain: Low Risk  (11/06/2023)   Received from [facility]   Overall Financial Resource Strain (CARDIA)    Difficulty of Paying Living Expenses: Not hard at all  Food Insecurity: No Food Insecurity (11/06/2023)   Received from [facility]   Hunger Vital Sign    Within the past 12 months, you worried that your food would run out before you got the money to buy more.: Never true    Within the past 12 months, the food you bought just didn't last and you didn't have money to get more.: Never true  Transportation Needs: No Transportation Needs (11/06/2023)   Received from [facility] - Transportation    In the past 12 months, has lack of transportation kept you from medical appointments or from getting medications?: No    Lack of Transportation (Non-Medical): No  Physical Activity: Not on file  Stress: Not on file  Social Connections: Not on file  Intimate Partner Violence: Not At Risk (05/26/2023)   Humiliation, Afraid, Rape, and Kick questionnaire    Fear of Current or Ex-Partner: No    Emotionally Abused: No    Physically Abused: No    Sexually Abused: No    FH:  Family History  Problem Relation Age of Onset   Emphysema Mother    COPD Mother    Heart disease Mother    Brain cancer Father     Past Medical History:   Diagnosis Date   (HFpEF) heart failure with preserved ejection fraction (HCC)    a.) TTE 11/26/2021: EF 45%, mod LVH, post HK, mod LAE, mild MR/TR, G2DD; b.) TTE 02/26/2022: EF 55-60%, BAE, triv MR, G2DD; c.) TTE 07/09/2022: EF 45%, post HK, LVH, mod LAE, triv TR/PR, mild MR, G1DD   Anemia of chronic renal failure    Aortic atherosclerosis    Asthma    Bell's palsy    BPH (benign prostatic hyperplasia)    CAD (coronary artery disease)    a.) LHC 12/31/2021: 60% mLAD, 20% pRCA, 20% dRCA, 20% o-pLCx --> med mgmt.   CKD (chronic kidney disease), stage IV (HCC)    Diabetic neuropathy (HCC)    Dyspnea on exertion    Ganglion cyst  of wrist, right    a.) s/p excision 04/2011   Hepatic steatosis    History of 2019 novel coronavirus disease (COVID-19)    History of bilateral cataract extraction 2018   Hyperlipidemia    Hyperparathyroidism due to renal insufficiency    Hypertension    Low testosterone in male    Nephrolithiasis    Obesity    OSA treated with BiPAP    Osteoarthritis    Peripheral edema    Right inguinal hernia    a.) s/p repair   Skin cancer    Type 2 diabetes mellitus with renal manifestations (HCC)     Current Outpatient Medications  Medication Sig Dispense Refill   acetaminophen  (TYLENOL ) 500 MG tablet Take 1,000 mg by mouth every 6 (six) hours as needed (shoulder pain).     albuterol  (PROVENTIL  HFA;VENTOLIN  HFA) 108 (90 Base) MCG/ACT inhaler Inhale 2 puffs into the lungs every 6 (six) hours as needed for wheezing or shortness of breath.     aspirin  EC 81 MG tablet Take by mouth. (Patient not taking: Reported on 06/03/2024)     calcitRIOL  (ROCALTROL ) 0.25 MCG capsule Take 0.25 mcg by mouth daily.     cetirizine (ZYRTEC) 10 MG chewable tablet Chew 10 mg by mouth daily as needed for allergies.     Continuous Blood Gluc Sensor (FREESTYLE LIBRE 14 DAY SENSOR) MISC by Does not apply route.     diazepam  (VALIUM ) 2 MG tablet Take 2 mg by mouth every 12 (twelve) hours as  needed. (Patient not taking: Reported on 06/03/2024)     ELIQUIS 2.5 MG TABS tablet Take 2.5 mg by mouth 2 (two) times daily.     fluticasone  (FLONASE ) 50 MCG/ACT nasal spray as needed.     furosemide  (LASIX ) 40 MG tablet Take 2 tablets (80 mg total) by mouth 2 (two) times daily. On non dialysis days, Sunday, Tuesday, Thursday, Saturday and Sunday 120 tablet 0   GLOBAL INJECT EASE INSULIN  SYR 31G X 5/16 0.5 ML MISC Inject into the skin.     glucose blood (ONETOUCH ULTRA) test strip TEST BLOOD SUGAR 4 TIMES DAILY     insulin  aspart (NOVOLOG ) 100 UNIT/ML injection Inject 14 Units into the skin 3 (three) times daily with meals. 10 mL 11   insulin  detemir (LEVEMIR ) 100 UNIT/ML injection Inject 0.5 mLs (50 Units total) into the skin at bedtime. (Patient not taking: Reported on 06/03/2024) 10 mL 1   isosorbide  mononitrate (IMDUR ) 30 MG 24 hr tablet Take 1 tablet (30 mg total) by mouth daily. 30 tablet 2   Lancets (ONETOUCH DELICA PLUS LANCET30G) MISC USE 1 LANCET 4 TIMES DAILY     lovastatin (MEVACOR) 40 MG tablet Take 40 mg by mouth daily with supper.     metoprolol  succinate (TOPROL -XL) 50 MG 24 hr tablet Take 2 tablets (100 mg total) by mouth daily. Do not take on Monday, Wednesday and Friday on dialysis days 120 tablet 0   midodrine (PROAMATINE) 10 MG tablet Take 10 mg by mouth 3 (three) times a week. On dialysis days and PRN on non-dialysis days     mupirocin  ointment (BACTROBAN ) 2 % Apply 1 Application topically 2 (two) times daily. 15 g 0   nitroGLYCERIN  (NITROSTAT ) 0.4 MG SL tablet Place 1 tablet (0.4 mg total) under the tongue every 5 (five) minutes as needed for chest pain. 20 tablet 1   sertraline  (ZOLOFT ) 50 MG tablet Take 50 mg by mouth at bedtime.     tamsulosin  (FLOMAX )  0.4 MG CAPS capsule TAKE (1) CAPSULE BY MOUTH EVERY DAY 30 capsule 11   No current facility-administered medications for this visit.   Vitals:   06/15/24 1024  BP: (!) 138/91  Pulse: (!) 125  SpO2: 95%  Weight: (!)  305 lb (138.3 kg)   Wt Readings from Last 3 Encounters:  06/15/24 (!) 305 lb (138.3 kg)  02/10/24 (!) 310 lb (140.6 kg)  02/03/24 (!) 305 lb 4 oz (138.5 kg)   Lab Results  Component Value Date   CREATININE 1.98 (H) 12/29/2023   CREATININE 2.52 (H) 05/29/2023   CREATININE 2.74 (H) 05/28/2023    PHYSICAL EXAM:  General: Well appearing obese male in wheelchair.  Cor: No JVD. Regular rhythm, tachycardic.  Lungs: clear Abdomen: soft, nontender, nondistended. Extremities: trace pitting edema BLE Neuro:. Affect pleasant   ECG: Wide QRS tachycardia, HR 125, unchanged from previous (personally reviewed)   ASSESSMENT & PLAN:  1: NICM with mildly reduced ejection fraction- - suspect due to HTN/ DM as cath showed nonobstructive CAD - NYHA class III - euvolemic - weight down 5 pounds from last visit here 4 months ago - continues with dialysis M, W, F - Echo 12/17/22: EF 50-55% along with mild LAE.  - Echo 07/09/22: EF of 45% along with moderate LVH.  - Echo 02/26/22: EF of 55-60% along with mild LVH, LAE and trivial MR.  - Echo 05/26/23: EF 30-35% with moderate LVH, mild MR - continue furosemide  80mg  BID on non-dialysis days; reports getting great response when takes diuretic - continue metoprolol  succinate 50mg  BID on non-dialysis days. Instructed to take 50mg  PM on dialysis days - entresto  stopped 07/25 due to dizziness - renal function limits other GDMT therapy - not adding salt and wife reads food labels for sodium content and understands to keep daily intake to 2000mg  - tries to keep daily fluid intake to 40 ounces - saw pulmonology Burnie) 06/24 - saw HF provider (Alluri) 08/25 - wearing compression socks daily with removal at bedtime - BNP 05/26/23 was 1047.2   2: HTN- - BP 138/91 - has midodrine  10mg  that he takes on dialysis days - saw PCP (Feldpausch) 10/25 - BMP 01/14/24 reviewed: sodium 141, potassium 4.0, creatinine 3.3 & GFR 18 - Labs getting done at dialysis    3: DM with CKD/ ESRD- - A1c 05/26/23 was 7.6% - saw nephrology Geoffry) 09/25 - saw endocrinology (Solum) 09/25 - saw vascular Earmon) 07/25 - had percutaneous transluminal angioplasty of the distal forearm cephalic vein with 5 mm diameter by 6 cm in length Lutonix drug-coated angioplasty balloon 01/13/23  4: Non-obstructive CAD- - saw cardiology Renata) 03/25 - LHC 12/31/21:    Mid LAD lesion is 60% stenosed.   Prox RCA lesion is 20% stenosed.   Dist RCA lesion is 20% stenosed.   Ost Cx to Prox Cx lesion is 20% stenosed.   The left ventricular systolic function is normal.   LV end diastolic pressure is moderately elevated.   The left ventricular ejection fraction is greater than 65% by visual estimate.  Patient has significant elevated end-diastolic pressure  5: Atrial fibrillation- - continue apixaban  2.5mg  BID - regular rhythm, tachycardic - Toprol  50mg  BID on non-dialysis days. Will add 50mg  PM on dialysis days.   Return in 6 months, sooner if needed. Offered to make appointments PRN but they request to continue coming.   I spent 35 minutes reviewing records, interviewing/ examing patient and managing plan/ orders.   Ellouise DELENA Class, FNP 06/14/24

## 2024-06-15 ENCOUNTER — Encounter: Payer: Self-pay | Admitting: Family

## 2024-06-15 ENCOUNTER — Ambulatory Visit: Attending: Family | Admitting: Family

## 2024-06-15 VITALS — BP 138/91 | HR 125 | Wt 305.0 lb

## 2024-06-15 DIAGNOSIS — E669 Obesity, unspecified: Secondary | ICD-10-CM | POA: Insufficient documentation

## 2024-06-15 DIAGNOSIS — Z992 Dependence on renal dialysis: Secondary | ICD-10-CM

## 2024-06-15 DIAGNOSIS — Z794 Long term (current) use of insulin: Secondary | ICD-10-CM | POA: Insufficient documentation

## 2024-06-15 DIAGNOSIS — I480 Paroxysmal atrial fibrillation: Secondary | ICD-10-CM

## 2024-06-15 DIAGNOSIS — R000 Tachycardia, unspecified: Secondary | ICD-10-CM | POA: Diagnosis not present

## 2024-06-15 DIAGNOSIS — Z79899 Other long term (current) drug therapy: Secondary | ICD-10-CM | POA: Insufficient documentation

## 2024-06-15 DIAGNOSIS — E1122 Type 2 diabetes mellitus with diabetic chronic kidney disease: Secondary | ICD-10-CM | POA: Insufficient documentation

## 2024-06-15 DIAGNOSIS — I5022 Chronic systolic (congestive) heart failure: Secondary | ICD-10-CM | POA: Diagnosis not present

## 2024-06-15 DIAGNOSIS — R531 Weakness: Secondary | ICD-10-CM | POA: Insufficient documentation

## 2024-06-15 DIAGNOSIS — I428 Other cardiomyopathies: Secondary | ICD-10-CM | POA: Diagnosis not present

## 2024-06-15 DIAGNOSIS — Z7901 Long term (current) use of anticoagulants: Secondary | ICD-10-CM | POA: Insufficient documentation

## 2024-06-15 DIAGNOSIS — N186 End stage renal disease: Secondary | ICD-10-CM | POA: Diagnosis not present

## 2024-06-15 DIAGNOSIS — I132 Hypertensive heart and chronic kidney disease with heart failure and with stage 5 chronic kidney disease, or end stage renal disease: Secondary | ICD-10-CM | POA: Diagnosis not present

## 2024-06-15 DIAGNOSIS — R0602 Shortness of breath: Secondary | ICD-10-CM | POA: Diagnosis not present

## 2024-06-15 DIAGNOSIS — I4891 Unspecified atrial fibrillation: Secondary | ICD-10-CM | POA: Insufficient documentation

## 2024-06-15 DIAGNOSIS — I10 Essential (primary) hypertension: Secondary | ICD-10-CM

## 2024-06-15 DIAGNOSIS — I2510 Atherosclerotic heart disease of native coronary artery without angina pectoris: Secondary | ICD-10-CM | POA: Diagnosis not present

## 2024-06-15 DIAGNOSIS — I252 Old myocardial infarction: Secondary | ICD-10-CM | POA: Diagnosis not present

## 2024-06-15 NOTE — Patient Instructions (Signed)
 Take the evening dose of metoprolol  on dialysis days.

## 2024-06-20 ENCOUNTER — Other Ambulatory Visit: Payer: Self-pay

## 2024-06-20 ENCOUNTER — Inpatient Hospital Stay
Admission: EM | Admit: 2024-06-20 | Discharge: 2024-06-24 | DRG: 291 | Disposition: A | Attending: Internal Medicine | Admitting: Internal Medicine

## 2024-06-20 ENCOUNTER — Emergency Department

## 2024-06-20 ENCOUNTER — Encounter: Payer: Self-pay | Admitting: Emergency Medicine

## 2024-06-20 DIAGNOSIS — R000 Tachycardia, unspecified: Secondary | ICD-10-CM | POA: Diagnosis not present

## 2024-06-20 DIAGNOSIS — K760 Fatty (change of) liver, not elsewhere classified: Secondary | ICD-10-CM | POA: Diagnosis not present

## 2024-06-20 DIAGNOSIS — I472 Ventricular tachycardia, unspecified: Secondary | ICD-10-CM | POA: Diagnosis not present

## 2024-06-20 DIAGNOSIS — J9601 Acute respiratory failure with hypoxia: Secondary | ICD-10-CM | POA: Diagnosis not present

## 2024-06-20 DIAGNOSIS — Z794 Long term (current) use of insulin: Secondary | ICD-10-CM | POA: Diagnosis not present

## 2024-06-20 DIAGNOSIS — I5043 Acute on chronic combined systolic (congestive) and diastolic (congestive) heart failure: Secondary | ICD-10-CM | POA: Diagnosis not present

## 2024-06-20 DIAGNOSIS — I953 Hypotension of hemodialysis: Secondary | ICD-10-CM | POA: Diagnosis not present

## 2024-06-20 DIAGNOSIS — R0602 Shortness of breath: Secondary | ICD-10-CM | POA: Diagnosis not present

## 2024-06-20 DIAGNOSIS — D631 Anemia in chronic kidney disease: Secondary | ICD-10-CM | POA: Diagnosis not present

## 2024-06-20 DIAGNOSIS — J9600 Acute respiratory failure, unspecified whether with hypoxia or hypercapnia: Secondary | ICD-10-CM | POA: Diagnosis not present

## 2024-06-20 DIAGNOSIS — J9621 Acute and chronic respiratory failure with hypoxia: Secondary | ICD-10-CM | POA: Diagnosis not present

## 2024-06-20 DIAGNOSIS — N186 End stage renal disease: Secondary | ICD-10-CM | POA: Diagnosis not present

## 2024-06-20 DIAGNOSIS — I132 Hypertensive heart and chronic kidney disease with heart failure and with stage 5 chronic kidney disease, or end stage renal disease: Secondary | ICD-10-CM | POA: Diagnosis not present

## 2024-06-20 DIAGNOSIS — Z8616 Personal history of COVID-19: Secondary | ICD-10-CM | POA: Diagnosis not present

## 2024-06-20 DIAGNOSIS — I10 Essential (primary) hypertension: Secondary | ICD-10-CM | POA: Diagnosis not present

## 2024-06-20 DIAGNOSIS — I5021 Acute systolic (congestive) heart failure: Secondary | ICD-10-CM | POA: Diagnosis not present

## 2024-06-20 DIAGNOSIS — I2489 Other forms of acute ischemic heart disease: Secondary | ICD-10-CM | POA: Diagnosis not present

## 2024-06-20 DIAGNOSIS — N2581 Secondary hyperparathyroidism of renal origin: Secondary | ICD-10-CM | POA: Diagnosis not present

## 2024-06-20 DIAGNOSIS — J45909 Unspecified asthma, uncomplicated: Secondary | ICD-10-CM | POA: Diagnosis not present

## 2024-06-20 DIAGNOSIS — I509 Heart failure, unspecified: Secondary | ICD-10-CM | POA: Diagnosis not present

## 2024-06-20 DIAGNOSIS — Z66 Do not resuscitate: Secondary | ICD-10-CM | POA: Diagnosis not present

## 2024-06-20 DIAGNOSIS — Z1152 Encounter for screening for COVID-19: Secondary | ICD-10-CM | POA: Diagnosis not present

## 2024-06-20 DIAGNOSIS — Z992 Dependence on renal dialysis: Secondary | ICD-10-CM | POA: Diagnosis not present

## 2024-06-20 DIAGNOSIS — E1122 Type 2 diabetes mellitus with diabetic chronic kidney disease: Secondary | ICD-10-CM | POA: Diagnosis not present

## 2024-06-20 DIAGNOSIS — I480 Paroxysmal atrial fibrillation: Secondary | ICD-10-CM | POA: Diagnosis not present

## 2024-06-20 DIAGNOSIS — R0689 Other abnormalities of breathing: Secondary | ICD-10-CM | POA: Diagnosis not present

## 2024-06-20 DIAGNOSIS — E785 Hyperlipidemia, unspecified: Secondary | ICD-10-CM | POA: Diagnosis not present

## 2024-06-20 DIAGNOSIS — R0600 Dyspnea, unspecified: Secondary | ICD-10-CM

## 2024-06-20 DIAGNOSIS — Z6841 Body Mass Index (BMI) 40.0 and over, adult: Secondary | ICD-10-CM | POA: Diagnosis not present

## 2024-06-20 DIAGNOSIS — I517 Cardiomegaly: Secondary | ICD-10-CM | POA: Diagnosis not present

## 2024-06-20 DIAGNOSIS — Z452 Encounter for adjustment and management of vascular access device: Secondary | ICD-10-CM | POA: Diagnosis not present

## 2024-06-20 DIAGNOSIS — Z7901 Long term (current) use of anticoagulants: Secondary | ICD-10-CM | POA: Diagnosis not present

## 2024-06-20 DIAGNOSIS — I700 Atherosclerosis of aorta: Secondary | ICD-10-CM | POA: Diagnosis not present

## 2024-06-20 DIAGNOSIS — E1140 Type 2 diabetes mellitus with diabetic neuropathy, unspecified: Secondary | ICD-10-CM | POA: Diagnosis not present

## 2024-06-20 DIAGNOSIS — R0902 Hypoxemia: Secondary | ICD-10-CM

## 2024-06-20 DIAGNOSIS — R069 Unspecified abnormalities of breathing: Secondary | ICD-10-CM | POA: Diagnosis not present

## 2024-06-20 LAB — CBC WITH DIFFERENTIAL/PLATELET
Abs Immature Granulocytes: 0.06 K/uL (ref 0.00–0.07)
Basophils Absolute: 0.1 K/uL (ref 0.0–0.1)
Basophils Relative: 0 %
Eosinophils Absolute: 0.3 K/uL (ref 0.0–0.5)
Eosinophils Relative: 2 %
HCT: 31.6 % — ABNORMAL LOW (ref 39.0–52.0)
Hemoglobin: 9.7 g/dL — ABNORMAL LOW (ref 13.0–17.0)
Immature Granulocytes: 1 %
Lymphocytes Relative: 11 %
Lymphs Abs: 1.5 K/uL (ref 0.7–4.0)
MCH: 29.3 pg (ref 26.0–34.0)
MCHC: 30.7 g/dL (ref 30.0–36.0)
MCV: 95.5 fL (ref 80.0–100.0)
Monocytes Absolute: 0.8 K/uL (ref 0.1–1.0)
Monocytes Relative: 6 %
Neutro Abs: 10.5 K/uL — ABNORMAL HIGH (ref 1.7–7.7)
Neutrophils Relative %: 80 %
Platelets: 357 K/uL (ref 150–400)
RBC: 3.31 MIL/uL — ABNORMAL LOW (ref 4.22–5.81)
RDW: 13.4 % (ref 11.5–15.5)
WBC: 13.2 K/uL — ABNORMAL HIGH (ref 4.0–10.5)
nRBC: 0 % (ref 0.0–0.2)

## 2024-06-20 LAB — COMPREHENSIVE METABOLIC PANEL WITH GFR
ALT: 17 U/L (ref 0–44)
AST: 21 U/L (ref 15–41)
Albumin: 3 g/dL — ABNORMAL LOW (ref 3.5–5.0)
Alkaline Phosphatase: 95 U/L (ref 38–126)
Anion gap: 12 (ref 5–15)
BUN: 44 mg/dL — ABNORMAL HIGH (ref 8–23)
CO2: 27 mmol/L (ref 22–32)
Calcium: 8.9 mg/dL (ref 8.9–10.3)
Chloride: 102 mmol/L (ref 98–111)
Creatinine, Ser: 3.21 mg/dL — ABNORMAL HIGH (ref 0.61–1.24)
GFR, Estimated: 18 mL/min — ABNORMAL LOW (ref >60)
Glucose, Bld: 144 mg/dL — ABNORMAL HIGH (ref 70–99)
Potassium: 3.6 mmol/L (ref 3.5–5.1)
Sodium: 141 mmol/L (ref 135–145)
Total Bilirubin: 0.2 mg/dL (ref 0.0–1.2)
Total Protein: 7 g/dL (ref 6.5–8.1)

## 2024-06-20 LAB — PRO BRAIN NATRIURETIC PEPTIDE: Pro Brain Natriuretic Peptide: 23719 pg/mL — ABNORMAL HIGH (ref <300.0)

## 2024-06-20 LAB — PROTIME-INR
INR: 1.1 (ref 0.8–1.2)
Prothrombin Time: 15.1 s (ref 11.4–15.2)

## 2024-06-20 LAB — RESP PANEL BY RT-PCR (RSV, FLU A&B, COVID)  RVPGX2
Influenza A by PCR: NEGATIVE
Influenza B by PCR: NEGATIVE
Resp Syncytial Virus by PCR: NEGATIVE
SARS Coronavirus 2 by RT PCR: NEGATIVE

## 2024-06-20 LAB — TROPONIN T, HIGH SENSITIVITY: Troponin T High Sensitivity: 140 ng/L (ref 0–19)

## 2024-06-20 MED ORDER — ACETAMINOPHEN 325 MG PO TABS: 650.0000 mg | ORAL_TABLET | ORAL | Status: DC | PRN

## 2024-06-20 MED ORDER — TAMSULOSIN HCL 0.4 MG PO CAPS
0.4000 mg | ORAL_CAPSULE | Freq: Every day | ORAL | Status: DC
  Administered 2024-06-21 – 2024-06-24 (×4): 0.4 mg via ORAL
  Filled 2024-06-20 (×4): qty 1

## 2024-06-20 MED ORDER — INSULIN ASPART 100 UNIT/ML IJ SOLN: 10.0000 [IU] | Freq: Three times a day (TID) | INTRAMUSCULAR | Status: DC

## 2024-06-20 MED ORDER — FUROSEMIDE 10 MG/ML IJ SOLN: 40.0000 mg | Freq: Two times a day (BID) | INTRAMUSCULAR | Status: DC

## 2024-06-20 MED ORDER — FUROSEMIDE 10 MG/ML IJ SOLN
40.0000 mg | Freq: Two times a day (BID) | INTRAMUSCULAR | Status: DC
  Administered 2024-06-21 – 2024-06-22 (×3): 40 mg via INTRAVENOUS
  Filled 2024-06-20 (×3): qty 4

## 2024-06-20 MED ORDER — MIDODRINE HCL 5 MG PO TABS
10.0000 mg | ORAL_TABLET | ORAL | Status: DC
  Administered 2024-06-21 – 2024-06-23 (×2): 10 mg via ORAL
  Filled 2024-06-20 (×2): qty 2

## 2024-06-20 MED ORDER — ACETAMINOPHEN 500 MG PO TABS
1000.0000 mg | ORAL_TABLET | Freq: Four times a day (QID) | ORAL | Status: DC | PRN
  Administered 2024-06-21 – 2024-06-22 (×2): 1000 mg via ORAL
  Filled 2024-06-20 (×2): qty 2

## 2024-06-20 MED ORDER — PRAVASTATIN SODIUM 20 MG PO TABS
20.0000 mg | ORAL_TABLET | Freq: Every day | ORAL | Status: DC
  Administered 2024-06-20 – 2024-06-23 (×4): 20 mg via ORAL
  Filled 2024-06-20 (×4): qty 1

## 2024-06-20 MED ORDER — MAGNESIUM SULFATE 2 GM/50ML IV SOLN
2.0000 g | Freq: Once | INTRAVENOUS | Status: AC
  Administered 2024-06-20: 2 g via INTRAVENOUS
  Filled 2024-06-20: qty 50

## 2024-06-20 MED ORDER — NITROGLYCERIN 0.4 MG SL SUBL
0.4000 mg | SUBLINGUAL_TABLET | SUBLINGUAL | Status: DC | PRN
  Administered 2024-06-20 – 2024-06-23 (×2): 0.4 mg via SUBLINGUAL
  Filled 2024-06-20 (×3): qty 1

## 2024-06-20 MED ORDER — ALBUTEROL SULFATE HFA 108 (90 BASE) MCG/ACT IN AERS: 2.0000 | INHALATION_SPRAY | Freq: Four times a day (QID) | RESPIRATORY_TRACT | Status: DC | PRN

## 2024-06-20 MED ORDER — IPRATROPIUM-ALBUTEROL 0.5-2.5 (3) MG/3ML IN SOLN
3.0000 mL | RESPIRATORY_TRACT | Status: DC | PRN
  Administered 2024-06-23 (×2): 3 mL via RESPIRATORY_TRACT
  Filled 2024-06-20 (×2): qty 3

## 2024-06-20 MED ORDER — METOPROLOL SUCCINATE ER 50 MG PO TB24
50.0000 mg | ORAL_TABLET | Freq: Every day | ORAL | Status: DC
  Administered 2024-06-21 – 2024-06-22 (×2): 50 mg via ORAL
  Filled 2024-06-20 (×3): qty 1

## 2024-06-20 MED ORDER — SODIUM CHLORIDE 0.9 % IV SOLN: 250.0000 mL | INTRAVENOUS | Status: AC | PRN

## 2024-06-20 MED ORDER — SODIUM CHLORIDE 0.9% FLUSH
3.0000 mL | Freq: Two times a day (BID) | INTRAVENOUS | Status: DC
  Administered 2024-06-20 – 2024-06-24 (×8): 3 mL via INTRAVENOUS

## 2024-06-20 MED ORDER — FLUTICASONE PROPIONATE 50 MCG/ACT NA SUSP: 1.0000 | NASAL | Status: DC | PRN

## 2024-06-20 MED ORDER — ORAL CARE MOUTH RINSE: 15.0000 mL | OROMUCOSAL | Status: DC | PRN

## 2024-06-20 MED ORDER — FUROSEMIDE 10 MG/ML IJ SOLN
80.0000 mg | Freq: Once | INTRAMUSCULAR | Status: AC
  Administered 2024-06-20: 80 mg via INTRAVENOUS
  Filled 2024-06-20: qty 8

## 2024-06-20 MED ORDER — SERTRALINE HCL 50 MG PO TABS
50.0000 mg | ORAL_TABLET | Freq: Every day | ORAL | Status: DC
  Administered 2024-06-20 – 2024-06-23 (×4): 50 mg via ORAL
  Filled 2024-06-20 (×4): qty 1

## 2024-06-20 MED ORDER — ONDANSETRON HCL 4 MG/2ML IJ SOLN: 4.0000 mg | Freq: Four times a day (QID) | INTRAMUSCULAR | Status: DC | PRN

## 2024-06-20 MED ORDER — SODIUM CHLORIDE 0.9% FLUSH: 3.0000 mL | INTRAVENOUS | Status: DC | PRN

## 2024-06-20 MED ORDER — ISOSORBIDE MONONITRATE ER 60 MG PO TB24
30.0000 mg | ORAL_TABLET | Freq: Every day | ORAL | Status: DC
  Administered 2024-06-21 – 2024-06-24 (×3): 30 mg via ORAL
  Filled 2024-06-20 (×4): qty 1

## 2024-06-20 MED ORDER — CALCITRIOL 0.25 MCG PO CAPS
0.2500 ug | ORAL_CAPSULE | Freq: Every day | ORAL | Status: DC
  Administered 2024-06-21 – 2024-06-24 (×4): 0.25 ug via ORAL
  Filled 2024-06-20 (×4): qty 1

## 2024-06-20 MED ORDER — INSULIN ASPART 100 UNIT/ML IJ SOLN
0.0000 [IU] | Freq: Three times a day (TID) | INTRAMUSCULAR | Status: DC
  Administered 2024-06-21: 1 [IU] via SUBCUTANEOUS
  Administered 2024-06-21: 4 [IU] via SUBCUTANEOUS
  Administered 2024-06-21 – 2024-06-22 (×2): 2 [IU] via SUBCUTANEOUS
  Administered 2024-06-22: 4 [IU] via SUBCUTANEOUS
  Administered 2024-06-22: 2 [IU] via SUBCUTANEOUS
  Administered 2024-06-23: 4 [IU] via SUBCUTANEOUS
  Administered 2024-06-24 (×2): 2 [IU] via SUBCUTANEOUS
  Filled 2024-06-20: qty 1
  Filled 2024-06-20: qty 4
  Filled 2024-06-20 (×2): qty 2
  Filled 2024-06-20: qty 4
  Filled 2024-06-20: qty 2
  Filled 2024-06-20: qty 4
  Filled 2024-06-20 (×2): qty 2

## 2024-06-20 MED ORDER — INSULIN GLARGINE-YFGN 100 UNIT/ML ~~LOC~~ SOLN
35.0000 [IU] | Freq: Every day | SUBCUTANEOUS | Status: DC
  Administered 2024-06-20 – 2024-06-22 (×2): 35 [IU] via SUBCUTANEOUS
  Filled 2024-06-20 (×4): qty 0.35

## 2024-06-20 MED ORDER — APIXABAN 2.5 MG PO TABS
2.5000 mg | ORAL_TABLET | Freq: Two times a day (BID) | ORAL | Status: DC
  Administered 2024-06-20 – 2024-06-24 (×7): 2.5 mg via ORAL
  Filled 2024-06-20 (×9): qty 1

## 2024-06-20 MED ORDER — INSULIN DEGLUDEC 100 UNIT/ML ~~LOC~~ SOLN: 35.0000 [IU] | Freq: Every day | SUBCUTANEOUS | Status: DC

## 2024-06-20 NOTE — ED Notes (Signed)
 Advised nurse that patient has ready bed

## 2024-06-20 NOTE — H&P (Signed)
 History and Physical    Patient: James Clarke FMW:[id number] DOB: 02-10-1941 DOA: 06/20/2024 DOS: the patient was seen and examined on 06/20/2024 PCP: Jeffie Cheryl BRAVO, MD  Patient coming from: Home  Chief Complaint:  Chief Complaint  Patient presents with   Shortness of Breath    PT to ER via EMS from home for C/O increase shortness of breath since his last dialysis - PT is a M/W?F Dialysis patient    HPI: Ladainian Therien is a 83 y.o. male with medical history significant of Obesity, end-stage renal disease on hemodialysis MWF, HFpEF, diabetes mellitus on insulin  therapy, hyperlipidemia.  He presented to the emergency room from home with complaints of shortness of breath.  Per patient, he had presented for his scheduled hemodialysis on Friday, however, yeah he did not complete his hemodialysis session due to equipment failure.  He was asked to come back the following day and completed 4 hours of hemodialysis.  Hemodialysis was uneventful.  Today however, he woke up feeling short of breath.  Symptoms worsened with minimal exertion to and from bathroom.  On account of the symptoms, patient was brought to the emergency room to be further evaluated.  He denied any worsening lower extremity edema.  He however admitted to chest tightness.  No associated palpitations, nausea or vomiting.  Denied any associated wheezing He admits to compliance with his home medication.  In the ED, patient was given IV 80 mg X1.  Chest x-ray done was personally reviewed by me.  No significant fluid overload.  Review of Systems: As mentioned in the history of present illness. All other systems reviewed and are negative. Past Medical History:  Diagnosis Date   (HFpEF) heart failure with preserved ejection fraction (HCC)    a.) TTE 11/26/2021: EF 45%, mod LVH, post HK, mod LAE, mild MR/TR, G2DD; b.) TTE 02/26/2022: EF 55-60%, BAE, triv MR, G2DD; c.) TTE 07/09/2022: EF 45%, post HK, LVH, mod LAE, triv TR/PR, mild  MR, G1DD   Anemia of chronic renal failure    Aortic atherosclerosis    Asthma    Bell's palsy    BPH (benign prostatic hyperplasia)    CAD (coronary artery disease)    a.) LHC 12/31/2021: 60% mLAD, 20% pRCA, 20% dRCA, 20% o-pLCx --> med mgmt.   CKD (chronic kidney disease), stage IV (HCC)    Diabetic neuropathy (HCC)    Dyspnea on exertion    Ganglion cyst of wrist, right    a.) s/p excision 04/2011   Hepatic steatosis    History of 2019 novel coronavirus disease (COVID-19)    History of bilateral cataract extraction 2018   Hyperlipidemia    Hyperparathyroidism due to renal insufficiency    Hypertension    Low testosterone in male    Nephrolithiasis    Obesity    OSA treated with BiPAP    Osteoarthritis    Peripheral edema    Right inguinal hernia    a.) s/p repair   Skin cancer    Type 2 diabetes mellitus with renal manifestations [facility])    Past Surgical History:  Procedure Laterality Date   A/V FISTULAGRAM Left 01/13/2023   Procedure: A/V Fistulagram;  Surgeon: Marea Selinda RAMAN, MD;  Location: [facility];  Service: Cardiovascular;  Laterality: Left;   A/V SHUNT INTERVENTION Left 04/11/2023   Procedure: A/V SHUNT INTERVENTION;  Surgeon: Marea Selinda RAMAN, MD;  Location: [facility];  Service: Cardiovascular;  Laterality: Left;   APPENDECTOMY  AV FISTULA PLACEMENT Left 09/19/2022   Procedure: ARTERIOVENOUS (AV) FISTULA CREATION (RADIO CEPHALIC);  Surgeon: Marea Selinda RAMAN, MD;  Location: [facility];  Service: Vascular;  Laterality: Left;   CATARACT EXTRACTION W/PHACO Left 05/20/2017   Procedure: CATARACT EXTRACTION PHACO AND INTRAOCULAR LENS PLACEMENT (IOC);  Surgeon: Jaye Fallow, MD;  Location: [facility];  Service: Ophthalmology;  Laterality: Left;  US  00:32.0 AP% 15.5 CDE 4.97 Fluid Pack Lot # [id number]   CATARACT EXTRACTION W/PHACO Right 06/10/2017   Procedure: CATARACT EXTRACTION PHACO AND INTRAOCULAR LENS PLACEMENT (IOC);  Surgeon: Jaye Fallow, MD;   Location: [facility];  Service: Ophthalmology;  Laterality: Right;  US   00:51 AP% 15.4 CDE 7.95 Fluid pack lot # [id number]   CHOLECYSTECTOMY     COLONOSCOPY     x3   CYSTOSCOPY W/ RETROGRADES Right 08/13/2019   Procedure: CYSTOSCOPY WITH RETROGRADE PYELOGRAM;  Surgeon: Francisca Redell BROCKS, MD;  Location: [facility];  Service: Urology;  Laterality: Right;   CYSTOSCOPY WITH STENT PLACEMENT Right 07/26/2019   Procedure: CYSTOSCOPY WITH STENT PLACEMENT;  Surgeon: Carolee Sherwood JONETTA DOUGLAS, MD;  Location: [facility];  Service: Urology;  Laterality: Right;   CYSTOSCOPY/URETEROSCOPY/HOLMIUM LASER/STENT PLACEMENT Right 08/13/2019   Procedure: CYSTOSCOPY/URETEROSCOPY/HOLMIUM LASER/STENT EXCHANGE;  Surgeon: Francisca Redell BROCKS, MD;  Location: [facility];  Service: Urology;  Laterality: Right;   CYSTOSCOPY/URETEROSCOPY/HOLMIUM LASER/STENT PLACEMENT Left 02/27/2022   Procedure: CYSTOSCOPY/URETEROSCOPY/HOLMIUM LASER/STENT PLACEMENT;  Surgeon: Twylla Glendia BROCKS, MD;  Location: [facility];  Service: Urology;  Laterality: Left;   DIALYSIS/PERMA CATHETER INSERTION N/A 04/11/2023   Procedure: DIALYSIS/PERMA CATHETER INSERTION;  Surgeon: Marea Selinda RAMAN, MD;  Location: [facility];  Service: Cardiovascular;  Laterality: N/A;   DIALYSIS/PERMA CATHETER INSERTION N/A 06/03/2024   Procedure: DIALYSIS/PERMA CATHETER INSERTION;  Surgeon: Marea Selinda RAMAN, MD;  Location: [facility];  Service: Cardiovascular;  Laterality: N/A;   GANGLION CYST EXCISION Right    INGUINAL HERNIA REPAIR Right    LEFT HEART CATH AND CORONARY ANGIOGRAPHY N/A 12/31/2021   Procedure: LEFT HEART CATH AND CORONARY ANGIOGRAPHY;  Surgeon: Hester Wolm PARAS, MD;  Location: [facility];  Service: Cardiovascular;  Laterality: N/A;   SHOULDER ARTHROSCOPY WITH SUBACROMIAL DECOMPRESSION AND OPEN ROTATOR C Right 09/11/2020   Procedure: Right shoulder arthroscopic rotator cuff repair vs Regeneten patch application, subacromial decompression, and biceps  tenodesis - Krystal Doyne to Assist;  Surgeon: Tobie Priest, MD;  Location: [facility];  Service: Orthopedics;  Laterality: Right;   TEMPORARY DIALYSIS CATHETER N/A 03/05/2022   Procedure: TEMPORARY DIALYSIS CATHETER;  Surgeon: Jama Cordella MATSU, MD;  Location: [facility];  Service: Cardiovascular;  Laterality: N/A;   TONSILLECTOMY     URETEROSCOPY WITH HOLMIUM LASER LITHOTRIPSY Left 02/27/2022   Procedure: URETEROSCOPY WITH HOLMIUM LASER LITHOTRIPSY;  Surgeon: Twylla Glendia BROCKS, MD;  Location: [facility];  Service: Urology;  Laterality: Left;   WRIST FRACTURE SURGERY Right    Social History:  reports that he has never smoked. He has never been exposed to tobacco smoke. He has never used smokeless tobacco. He reports that he does not currently use drugs. He reports that he does not drink alcohol.  Allergies  Allergen Reactions   Codeine Nausea And Vomiting   Doxycycline    Erythromycin Rash    Family History  Problem Relation Age of Onset   Emphysema Mother    COPD Mother    Heart disease Mother    Brain cancer Father     Prior to Admission medications   Medication  Sig Start Date End Date Taking? Authorizing Provider  apixaban (ELIQUIS) 2.5 MG TABS tablet Take 2.5 mg by mouth. 05/17/24  Yes [provider]  acetaminophen  (TYLENOL ) 500 MG tablet Take 1,000 mg by mouth every 6 (six) hours as needed (shoulder pain).    [provider]  albuterol  (PROVENTIL  HFA;VENTOLIN  HFA) 108 (90 Base) MCG/ACT inhaler Inhale 2 puffs into the lungs every 6 (six) hours as needed for wheezing or shortness of breath.    [provider]  calcitRIOL  (ROCALTROL ) 0.25 MCG capsule Take 0.25 mcg by mouth daily. 07/13/19   [provider]  cetirizine (ZYRTEC) 10 MG chewable tablet Chew 10 mg by mouth daily as needed for allergies.    [provider]  Continuous Blood Gluc Sensor (FREESTYLE LIBRE 14 DAY SENSOR) MISC by Does not apply route.    [provider]  diazepam  (VALIUM ) 2 MG tablet Take 2 mg by mouth every 12 (twelve) hours as needed. 04/21/23   [provider]  ELIQUIS 2.5 MG TABS tablet Take 2.5 mg by mouth 2 (two) times daily. 01/01/24   [provider]  fluticasone  (FLONASE ) 50 MCG/ACT nasal spray as needed. 03/17/17   [provider]  furosemide  (LASIX ) 40 MG tablet Take 2 tablets (80 mg total) by mouth 2 (two) times daily. On non dialysis days, Sunday, Tuesday, Thursday, Saturday and Sunday 05/29/23   Dorinda Drue DASEN, MD  GLOBAL INJECT EASE INSULIN  SYR 31G X 5/16 0.5 ML MISC Inject into the skin. 08/16/21   [provider]  glucose blood (ONETOUCH ULTRA) test strip TEST BLOOD SUGAR 4 TIMES DAILY 10/26/18   [provider]  insulin  aspart (NOVOLOG ) 100 UNIT/ML injection Inject 14 Units into the skin 3 (three) times daily with meals. 12/17/22   Josette Ade, MD  Insulin  Degludec (TRESIBA) 100 UNIT/ML SOLN Inject 35 Units into the skin at bedtime.    [provider]  isosorbide  mononitrate (IMDUR ) 30 MG 24 hr tablet Take 1 tablet (30 mg total) by mouth daily. 06/21/22   Patel, Sona, MD  Lancets [facility] DELICA PLUS LANCET30G) MISC USE 1 LANCET 4 TIMES DAILY 05/04/19   [provider]  lovastatin (MEVACOR) 40 MG tablet Take 40 mg by mouth daily with supper. 04/28/17   [provider]  metoprolol  succinate (TOPROL -XL) 50 MG 24 hr tablet Take 50 mg by mouth in the morning and at bedtime. Take with or immediately following a meal.    [provider]  midodrine (PROAMATINE) 10 MG tablet Take 10 mg by mouth 3 (three) times a week. On dialysis days and PRN on non-dialysis days    [provider]  mupirocin  ointment (BACTROBAN ) 2 % Apply 1 Application topically 2 (two) times daily. 10/04/23   Rising, Asberry, PA-C  nitroGLYCERIN  (NITROSTAT ) 0.4 MG SL tablet Place 1 tablet (0.4 mg total) under the tongue every 5 (five) minutes as needed for chest pain. 06/20/22    Patel, Sona, MD  sertraline  (ZOLOFT ) 50 MG tablet Take 50 mg by mouth at bedtime. 04/21/23   [provider]  tamsulosin  (FLOMAX ) 0.4 MG CAPS capsule TAKE (1) CAPSULE BY MOUTH EVERY DAY 06/07/24   Twylla Glendia BROCKS, MD    Physical Exam: Vitals:   06/20/24 1508 06/20/24 1530 06/20/24 1600 06/20/24 1630  BP: (!) 142/65 (!) 141/57 139/72 (!) 140/63  Pulse: 75 69 65 66  Resp: (!) 22 17 (!) 24 (!) 22  Temp: 98 F (36.7 C)     SpO2: 99%  93% 92% 94%  Weight:        Data Reviewed: Sodium is 141 potassium 3.6, chloride 102, bicarb 27, glucose 144, BUN 44, creatinine 3.2, calcium 8.9, AST 21, ALT 17, BNP 23.719.  Troponin was 140.,  WBC 13.2, hemoglobin 9.7, hematocrit 31.6, neutrophils is 80 Neutro 10.5  RSV, influenza and COVID-negative. Chest x-ray on my read shows vascular congestion.  Assessment and Plan: 84 year old male with history of end-stage renal disease, HFpEF, hypertension, diabetes mellitus, obesity who presents on account of worsening shortness of breath after his hemodialysis schedule was revised due to equipment failure  #1 Acute hypoxemic respiratory failure: Most likely due to pulmonary vascular congestion versus fluid overload in the context of end-stage renal disease and chronic combined diastolic/systolic congestive failure.  Known ejection fraction of 30 to 35%.  Patient will require urgent hemodialysis.  In the interim, patient will be challenged with IV Lasix  40 mg twice daily.  Nephrology was consulted from the ED.  Expected hemodialysis in AM.  Continue with oxygen supplementation.  Will consult with nephrology and cardiology for optimization of treatment.  #2.Elevated Cardiac markers-review of records suggest, patient has chronically elevated cardiac markers.  Current elevated cardiac markers been consistent with possible demand from palpitations  #3.  End-stage renal disease on hemodialysis: MWF Nephrology will be consulted for hemodialysis  maintenance.  #4.  Type 2 diabetes mellitus on insulin .  Complicated by diabetic nephropathy, neuropathy.  Patient will be continued on current insulin  regimen.  Fingerstick glucose monitoring per protocol.  #5.  Paroxysmal atrial fibrillation: Rate and rhythm controlled at this time.  Continue with home medications.  Patient is on chronic anticoagulation with Eliquis  2.5 mg twice daily.  #6.  Hypotension with hemodialysis -Stable on current regimen with midodrine  predialysis  #7.  Nonobstructive coronary artery disease: Patient is on aspirin  and statins.  Continue with same.  #8.  Leukocytosis: Etiology is unclear.   Advance Care Planning:   Code Status: Limited: Do not attempt resuscitation (DNR) -DNR-LIMITED -Do Not Intubate/DNI    Consults: Nephrology  Family Communication: Family at bedside updated  Severity of Illness: The appropriate patient status for this patient is INPATIENT. Inpatient status is judged to be reasonable and necessary in order to provide the required intensity of service to ensure the patient's safety. The patient's presenting symptoms, physical exam findings, and initial radiographic and laboratory data in the context of their chronic comorbidities is felt to place them at high risk for further clinical deterioration. Furthermore, it is not anticipated that the patient will be medically stable for discharge from the hospital within 2 midnights of admission.   * I certify that at the point of admission it is my clinical judgment that the patient will require inpatient hospital care spanning beyond 2 midnights from the point of admission due to high intensity of service, high risk for further deterioration and high frequency of surveillance required.*  Author: Maude MARLA Dart, MD 06/20/2024 5:32 PM  For on call review [email or web address].

## 2024-06-20 NOTE — ED Provider Notes (Addendum)
 James Clarke Provider Note    Event Date/Time   First MD Initiated Contact with Patient 06/20/24 1505     (approximate)   History   Shortness of Breath (PT to ER via EMS from home for C/O increase shortness of breath since his last dialysis - PT is a M/W?F Dialysis patient )   HPI  James Clarke is a 83 y.o. male with history of ESRD on dialysis Monday Wednesday Friday, history of diabetes, CHF on Lasix , CAD, on Eliquis, presenting with shortness of breath.  Patient states that he had only 1.5 hours of dialysis done on Friday because the machine broke, went back on Saturday and got a full 4 hours of dialysis.  Had reportedly desatted to the 80s during dialysis which is this is the first occurrence.  States that he feels short of breath especially on exertion, did take 80 mg of Lasix  yesterday and 80 mg of Lasix  today.  Is able to void.  He has a cough, nonproductive.  No chest pain or fever.  No nausea vomiting or diarrhea.  Independent history from family, he was noted to be satting 90%, when he got up to go to the bathroom, he desatted to the low 80s.  Was quite short of breath.  Per independent history from EMS, he was noted to be satting in the 80s, was placed on nonrebreather.   On independent chart review, he had a tunneled catheter on the right placed by vascular surgery mid November.  Had an echo done in 2024 that showed an EF of 30 to 35%.     Physical Exam   Triage Vital Signs: ED Triage Vitals  Encounter Vitals Group     BP 06/20/24 1508 (!) 142/65     Girls Systolic BP Percentile --      Girls Diastolic BP Percentile --      Boys Systolic BP Percentile --      Boys Diastolic BP Percentile --      Pulse Rate 06/20/24 1508 75     Resp 06/20/24 1508 (!) 22     Temp 06/20/24 1508 98 F (36.7 C)     Temp src --      SpO2 06/20/24 1508 99 %     Weight 06/20/24 1455 (!) 306 lb 7 oz (139 kg)     Height --      Head Circumference --      Peak  Flow --      Pain Score 06/20/24 1455 0     Pain Loc --      Pain Education --      Exclude from Growth Chart --     Most recent vital signs: Vitals:   06/20/24 1600 06/20/24 1630  BP: 139/72 (!) 140/63  Pulse: 65 66  Resp: (!) 24 (!) 22  Temp:    SpO2: 92% 94%     General: Awake, no distress.  CV:  Good peripheral perfusion.  Resp:  Normal effort.  No wheezing, there crackles in the bases Abd:  No distention.  Soft nontender Other:  Lower extremity edema that appears symmetrical   ED Results / Procedures / Treatments   Labs (all labs ordered are listed, but only abnormal results are displayed) Labs Reviewed  PRO BRAIN NATRIURETIC PEPTIDE - Abnormal; Notable for the following components:      Result Value   Pro Brain Natriuretic Peptide 23,719.0 (*)    All other components within normal  limits  COMPREHENSIVE METABOLIC PANEL WITH GFR - Abnormal; Notable for the following components:   Glucose, Bld 144 (*)    BUN 44 (*)    Creatinine, Ser 3.21 (*)    Albumin 3.0 (*)    GFR, Estimated 18 (*)    All other components within normal limits  CBC WITH DIFFERENTIAL/PLATELET - Abnormal; Notable for the following components:   WBC 13.2 (*)    RBC 3.31 (*)    Hemoglobin 9.7 (*)    HCT 31.6 (*)    Neutro Abs 10.5 (*)    All other components within normal limits  TROPONIN T, HIGH SENSITIVITY - Abnormal; Notable for the following components:   Troponin T High Sensitivity 140 (*)    All other components within normal limits  RESP PANEL BY RT-PCR (RSV, FLU A&B, COVID)  [id number]  PROTIME-INR  TROPONIN T, HIGH SENSITIVITY     EKG  EKG shows, sinus rhythm, rate 74, chronic QRS, normal QTc, intraventricular conduction delay, no obvious ischemic ST elevation, T wave flattening to V5, V6, T wave changes are new compared to prior   RADIOLOGY On my independent interpretation, chest x-ray shows bilateral reticulations   PROCEDURES:  Critical Care performed: No  .Critical  Care  Performed by: Waymond Lorelle Cummins, MD Authorized by: Waymond Lorelle Cummins, MD   Critical care provider statement:    Critical care time (minutes):  40   Critical care was time spent personally by me on the following activities:  Development of treatment plan with patient or surrogate, discussions with consultants, evaluation of patient's response to treatment, examination of patient, ordering and review of laboratory studies, ordering and review of radiographic studies, ordering and performing treatments and interventions, pulse oximetry, re-evaluation of patient's condition and review of old charts    MEDICATIONS ORDERED IN ED: Medications  furosemide  (LASIX ) injection 80 mg (has no administration in time range)     IMPRESSION / MDM / ASSESSMENT AND PLAN / ED COURSE  I reviewed the triage vital signs and the nursing notes.                              Differential diagnosis includes, but is not limited to, CHF exacerbation, volume overload, lymphedema, viral illness, pneumonia, atypical ACS.  Did consider PE but patient is satting high 90s on room air at this time, he has no unilateral calf swelling or tenderness, is on Eliquis, his clinical picture seems more consistent with volume overload at this time.  Will start with labs, EKG, troponin, chest x-ray.  Viral swab.  Patient's presentation is most consistent with acute presentation with potential threat to life or bodily function.  Independent interpretation of labs and imaging below.  Clinical course as below.  Given volume overload, hypoxia, he will need be admitted for further management.  Discussed with nephrology who recommended IV Lasix , will dialyze him tomorrow.  Consult to hospitalist will admit the patient.  He is admitted.    Clinical Course as of 06/20/24 1659  Sun Jun 20, 2024  1638 Independent review of labs, mild leukocytosis, electrolytes not severely deranged, creatinine is elevated but he has history of ESRD on dialysis,  troponin is mildly elevated, suspect demand given he has no chest pain at this time, his BNP is elevated, respiratory viral panel is negative. [TT]  1638 DG Chest 1 View IMPRESSION: 1. No acute cardiopulmonary disease. 2. Mild cardiomegaly and chronic interstitial thickening.   [  TT]  1641 Will give him a dose of IV Lasix  here.  Reach out to nephrology for dialysis. [TT]  [id number] Patient with desat to low 90s, not typically on oxygen, no history of COPD.  Will put him on 2 L nasal cannula.  He is slightly tachypneic.  Suspect he might be volume overloaded, the IV Lasix  has been ordered.  Discussed with nephrology who recommended doing IV Lasix , they will likely dialyze him tomorrow morning.  Will plan to have him admitted for further management. [TT]    Clinical Course User Index [TT] Waymond Lorelle Cummins, MD     FINAL CLINICAL IMPRESSION(S) / ED DIAGNOSES   Final diagnoses:  Hypoxia  Dyspnea, unspecified type  ESRD on dialysis [facility])  Acute on chronic congestive heart failure, unspecified heart failure type (HCC)     Rx / DC Orders   ED Discharge Orders     None        Note:  This document was prepared using Dragon voice recognition software and may include unintentional dictation errors.    Waymond Lorelle Cummins, MD 06/20/24 1659    Waymond Lorelle Cummins, MD 06/20/24 [phone]

## 2024-06-21 ENCOUNTER — Inpatient Hospital Stay: Admit: 2024-06-21 | Discharge: 2024-06-21 | Disposition: A | Attending: Internal Medicine

## 2024-06-21 DIAGNOSIS — J9601 Acute respiratory failure with hypoxia: Secondary | ICD-10-CM | POA: Diagnosis present

## 2024-06-21 DIAGNOSIS — J9621 Acute and chronic respiratory failure with hypoxia: Secondary | ICD-10-CM | POA: Diagnosis present

## 2024-06-21 DIAGNOSIS — I5043 Acute on chronic combined systolic (congestive) and diastolic (congestive) heart failure: Secondary | ICD-10-CM | POA: Diagnosis not present

## 2024-06-21 LAB — BASIC METABOLIC PANEL WITH GFR
Anion gap: 13 (ref 5–15)
BUN: 47 mg/dL — ABNORMAL HIGH (ref 8–23)
CO2: 24 mmol/L (ref 22–32)
Calcium: 8.5 mg/dL — ABNORMAL LOW (ref 8.9–10.3)
Chloride: 100 mmol/L (ref 98–111)
Creatinine, Ser: 3.65 mg/dL — ABNORMAL HIGH (ref 0.61–1.24)
GFR, Estimated: 16 mL/min — ABNORMAL LOW (ref >60)
Glucose, Bld: 253 mg/dL — ABNORMAL HIGH (ref 70–99)
Potassium: 3.9 mmol/L (ref 3.5–5.1)
Sodium: 137 mmol/L (ref 135–145)

## 2024-06-21 LAB — ECHOCARDIOGRAM COMPLETE
Height: 69 in
S' Lateral: 4.7 cm
Weight: 4903.03 [oz_av]

## 2024-06-21 LAB — HEMOGLOBIN A1C
Hgb A1c MFr Bld: 8.5 % — ABNORMAL HIGH (ref 4.8–5.6)
Mean Plasma Glucose: 197 mg/dL

## 2024-06-21 LAB — HEPATITIS B SURFACE ANTIGEN: Hepatitis B Surface Ag: NONREACTIVE

## 2024-06-21 LAB — TROPONIN T, HIGH SENSITIVITY: Troponin T High Sensitivity: 132 ng/L (ref 0–19)

## 2024-06-21 LAB — MAGNESIUM: Magnesium: 3.9 mg/dL — ABNORMAL HIGH (ref 1.7–2.4)

## 2024-06-21 MED ORDER — CHLORHEXIDINE GLUCONATE CLOTH 2 % EX PADS
6.0000 | MEDICATED_PAD | Freq: Every day | CUTANEOUS | Status: DC
  Administered 2024-06-22 – 2024-06-24 (×3): 6 via TOPICAL
  Filled 2024-06-21 (×2): qty 6

## 2024-06-21 MED ORDER — HEPARIN SODIUM (PORCINE) 1000 UNIT/ML IJ SOLN
INTRAMUSCULAR | Status: AC
  Filled 2024-06-21: qty 1

## 2024-06-21 NOTE — Progress Notes (Signed)
   06/21/24 1720  Vitals  BP (!) 146/78  Pulse Rate 75  Resp 18  Weight (!) 137.3 kg  Type of Weight Post-Dialysis  Oxygen Therapy  SpO2 100 %  O2 Device Nasal Cannula  Patient Activity (if Appropriate) In bed  Pulse Oximetry Type Continuous  Oximetry Probe Site Changed No  During Treatment Monitoring  Blood Flow Rate (mL/min) 0 mL/min  Arterial Pressure (mmHg) -73.93 mmHg  Venous Pressure (mmHg) 61.21 mmHg  TMP (mmHg) -1.01 mmHg  Ultrafiltration Rate (mL/min) 736 mL/min  Dialysate Flow Rate (mL/min) 299 ml/min  Dialysate Potassium Concentration 3  Dialysate Calcium Concentration 2.5  Duration of HD Treatment -hour(s) 3.5 hour(s)  Cumulative Fluid Removed (mL) per Treatment  2000.15  Post Treatment  Dialyzer Clearance Clear  Hemodialysis Intake (mL) 0 mL  Liters Processed 84.2  Fluid Removed (mL) 2500 mL  Tolerated HD Treatment Yes  Post-Hemodialysis Comments Tx has been tolerated. VS have remained stable. Access dressing changed. No verbalized concerns. No changes from baseline

## 2024-06-21 NOTE — ED Notes (Signed)
 Pt stood and transferred to bedside commode to defecate.

## 2024-06-21 NOTE — Progress Notes (Signed)
 Pt receives outpt HD at [facility] on MWF at 10:45am . Navigator following to assist with any HD needs.   Suzen Satchel Dialysis Navigator [phone]

## 2024-06-21 NOTE — Evaluation (Signed)
 Occupational Therapy Evaluation Patient Details Name: James Clarke MRN: [id number] DOB: 1941-06-30 Today's Date: 06/21/2024   History of Present Illness   Pt is an 83 y/o M admitted on 06/20/24 after presenting with c/o increased SOB. Pt is being treated for acute hypoxemic respiratory failure. PMH: obesity, ESRD on HD MWF, HFpEF, DM, HLD, asthma, Bell's palsy, CAD, neuropathy, OSA on BiPAP, OA     Clinical Impressions Patient was seen for OT evaluation this date. Prior to hospital admission, patient was requiring A with ADLs due to fatigue and limited mobility with RUE. Patient lives with spouse who is able/willing to provide A. They both report they are happy with how they manage ADLs and wife is agreebale to continue to provide A. Patient currently on 3L of O2, not on O2 at home, O2 99% at rest, decreased to 1L and remained above 95%. Patient ambulated 30 feet with r/w to bathroom; performed toileting with max A from spouse; required mod A to stand up from toilet. Transferred into recliner, transport present to take patient to HD.  Patient presents with deficits in activity tolerance, standing tolerance, balance and gross strength, affecting safe and optimal ADL completion. Patient is currently requiring max A for ADLs. Patient is likely close to baseline with ability to manage ADLs but is very deconditioned  Patient would benefit from skilled OT services to address noted impairments and functional limitations (see below for any additional details) in order to maximize safety and independence while minimizing future risk of falls, injury, and readmission. Anticipate the need for follow up OT services upon acute hospital DC.      If plan is discharge home, recommend the following:   A little help with walking and/or transfers;A lot of help with bathing/dressing/bathroom;Help with stairs or ramp for entrance;Assistance with cooking/housework     Functional Status Assessment   Patient  has had a recent decline in their functional status and demonstrates the ability to make significant improvements in function in a reasonable and predictable amount of time.     Equipment Recommendations   None recommended by OT     Recommendations for Other Services         Precautions/Restrictions   Precautions Precautions: Fall     Mobility Bed Mobility Overal bed mobility: Needs Assistance Bed Mobility: Sit to Supine       Sit to supine: Min assist, HOB elevated, Used rails        Transfers Overall transfer level: Needs assistance Equipment used: Rolling walker (2 wheels) Transfers: Sit to/from Stand Sit to Stand: Mod assist           General transfer comment: mod A to stand from toilet      Balance Overall balance assessment: Needs assistance Sitting-balance support: Feet supported Sitting balance-Leahy Scale: Good     Standing balance support: During functional activity, Bilateral upper extremity supported Standing balance-Leahy Scale: Fair                             ADL either performed or assessed with clinical judgement   ADL Overall ADL's : Needs assistance/impaired                         Toilet Transfer: Moderate assistance;Regular Toilet;Grab bars;Rolling walker (2 wheels)   Toileting- Clothing Manipulation and Hygiene: Maximal assistance;Sit to/from stand         General ADL Comments: wife assisting with  toileting     Vision         Perception         Praxis         Pertinent Vitals/Pain Pain Assessment Pain Assessment: 0-10 Pain Score: 0-No pain     Extremity/Trunk Assessment Upper Extremity Assessment Upper Extremity Assessment: Generalized weakness   Lower Extremity Assessment Lower Extremity Assessment: Generalized weakness       Communication Communication Communication: No apparent difficulties   Cognition Arousal: Alert Behavior During Therapy: WFL for tasks  assessed/performed                                 Following commands: Intact       Cueing  General Comments   Cueing Techniques: Verbal cues  started on 3L, 99%, dropped to 1L while at rest, stayed above 95%; became dyspneic during toileting so returned O2 to 3L   Exercises     Shoulder Instructions      Home Living Family/patient expects to be discharged to:: Private residence Living Arrangements: Spouse/significant other Available Help at Discharge: Family Type of Home: House Home Access: Ramped entrance     Home Layout: One level     Bathroom Shower/Tub: Producer, Television/film/video: Handicapped height Bathroom Accessibility: Yes How Accessible: Accessible via wheelchair;Accessible via walker Home Equipment: Agricultural Consultant (2 wheels);Cane - single point;Shower seat - built in;Hand held shower head;BSC/3in1;Grab bars - toilet;Grab bars - tub/shower          Prior Functioning/Environment Prior Level of Function : Needs assist       Physical Assist : ADLs (physical)   ADLs (physical): Bathing;Dressing;Toileting;IADLs Mobility Comments: Ambulatory with SPC>RW ADLs Comments: wife has to assist with ADLs due to patients fatigue    OT Problem List: Decreased strength;Decreased range of motion;Decreased activity tolerance   OT Treatment/Interventions: Self-care/ADL training;Therapeutic exercise;Energy conservation;DME and/or AE instruction;Therapeutic activities;Balance training;Patient/family education      OT Goals(Current goals can be found in the care plan section)   Acute Rehab OT Goals Patient Stated Goal: to go home OT Goal Formulation: With patient/family Time For Goal Achievement: 07/05/24 Potential to Achieve Goals: Fair ADL Goals Pt Will Perform Grooming: with supervision;standing Pt Will Perform Upper Body Dressing: with supervision;sitting Pt Will Transfer to Toilet: with supervision;ambulating;grab bars;regular height  toilet   OT Frequency:  Min 2X/week    Co-evaluation              AM-PAC OT 6 Clicks Daily Activity     Outcome Measure Help from another person eating meals?: None Help from another person taking care of personal grooming?: None Help from another person toileting, which includes using toliet, bedpan, or urinal?: A Lot Help from another person bathing (including washing, rinsing, drying)?: A Lot Help from another person to put on and taking off regular upper body clothing?: A Little Help from another person to put on and taking off regular lower body clothing?: A Lot 6 Click Score: 17   End of Session Equipment Utilized During Treatment: Gait belt;Rolling walker (2 wheels);Oxygen Nurse Communication: Mobility status  Activity Tolerance: Patient tolerated treatment well Patient left: in bed;with nursing/sitter in room;with family/visitor present  OT Visit Diagnosis: Unsteadiness on feet (R26.81);Other abnormalities of gait and mobility (R26.89);Repeated falls (R29.6);Muscle weakness (generalized) (M62.81)                Time: 8773-8698 OT Time Calculation (min): 35 min Charges:  OT General Charges $OT Visit: 1 Visit OT Evaluation $OT Eval Low Complexity: 1 Low OT Treatments $Self Care/Home Management : 23-37 mins  Rogers Clause, OT/L MSOT, 06/21/2024

## 2024-06-21 NOTE — ED Notes (Signed)
 Per pt's DEXCOM, sugar is 152

## 2024-06-21 NOTE — Evaluation (Signed)
 Physical Therapy Evaluation Patient Details Name: James Clarke MRN: [id number] DOB: January 27, 1941 Today's Date: 06/21/2024  History of Present Illness  Pt is an 83 y/o M admitted on 06/20/24 after presenting with c/o increased SOB. Pt is being treated for acute hypoxemic respiratory failure. PMH: obesity, ESRD on HD MWF, HFpEF, DM, HLD, asthma, Bell's palsy, CAD, neuropathy, OSA on BiPAP, OA  Clinical Impression  Pt seen for PT evaluation with pt agreeable, wife present in room. Pt reports prior to admission he was ambulatory with SPC>RW, wife assisted with bathing & dressing.  On this date, pt is able to complete bed mobility with mod I, sit>stand with supervision, & side stepping & marching in place with RW & CGA<>supervision. Pt's overall mobility limited by feeling SOB. Pt on 3L/min, 1 reading of 83% but immediately increased to 93% so question 83% reading, otherwise >/= 90%. Educated pt on pursed lip breathing. Recommend ongoing PT treatment to progress mobility as able.      If plan is discharge home, recommend the following: A little help with walking and/or transfers;A little help with bathing/dressing/bathroom;Assistance with cooking/housework;Assist for transportation;Help with stairs or ramp for entrance   Can travel by private vehicle        Equipment Recommendations None recommended by PT  Recommendations for Other Services       Functional Status Assessment Patient has had a recent decline in their functional status and demonstrates the ability to make significant improvements in function in a reasonable and predictable amount of time.     Precautions / Restrictions Precautions Precautions: Fall Restrictions Weight Bearing Restrictions Per Provider Order: No      Mobility  Bed Mobility Overal bed mobility: Needs Assistance Bed Mobility: Supine to Sit     Supine to sit: Modified independent (Device/Increase time), HOB elevated, Used rails (exit L side of bed, extra  time, use of bed rails)          Transfers Overall transfer level: Needs assistance Equipment used: Rolling walker (2 wheels) Transfers: Sit to/from Stand Sit to Stand: Supervision           General transfer comment: cuing re: hand placement during sit<>stand    Ambulation/Gait Ambulation/Gait assistance: Contact guard assist Gait Distance (Feet): 3 Feet Assistive device: Rolling walker (2 wheels) Gait Pattern/deviations: Decreased step length - right, Decreased step length - left, Decreased stride length Gait velocity: decreased     General Gait Details: side steps to L along EOB with RW  Stairs            Wheelchair Mobility     Tilt Bed    Modified Rankin (Stroke Patients Only)       Balance Overall balance assessment: Needs assistance Sitting-balance support: Feet supported Sitting balance-Leahy Scale: Good     Standing balance support: During functional activity, Bilateral upper extremity supported Standing balance-Leahy Scale: Fair                               Pertinent Vitals/Pain Pain Assessment Pain Assessment: Faces Faces Pain Scale: Hurts a little bit Pain Location: low back 2/2 lying in bed Pain Descriptors / Indicators: Discomfort Pain Intervention(s): Monitored during session, Repositioned    Home Living Family/patient expects to be discharged to:: Private residence Living Arrangements: Spouse/significant other Available Help at Discharge: Family Type of Home: House Home Access: Ramped entrance       Home Layout: One level Home Equipment: Agricultural Consultant (  2 wheels);Cane - single point;Shower seat - built in;Hand held shower head;BSC/3in1      Prior Function               Mobility Comments: Ambulatory with SPC ADLs Comments: wife assists with bathing & dressing, cooking & cleaning, driving     Extremity/Trunk Assessment   Upper Extremity Assessment Upper Extremity Assessment: Overall WFL for tasks  assessed    Lower Extremity Assessment Lower Extremity Assessment: Overall WFL for tasks assessed;Generalized weakness       Communication   Communication Communication: No apparent difficulties    Cognition Arousal: Alert Behavior During Therapy: WFL for tasks assessed/performed   PT - Cognitive impairments: No apparent impairments                         Following commands: Intact       Cueing Cueing Techniques: Verbal cues     General Comments      Exercises Other Exercises Other Exercises: Pt performed standing marching in place with BUE support on RW to tolerance x 3, tolerated standing ~4 minutes   Assessment/Plan    PT Assessment Patient needs continued PT services  PT Problem List Decreased strength;Cardiopulmonary status limiting activity;Decreased range of motion;Decreased activity tolerance;Decreased knowledge of use of DME;Decreased balance;Decreased mobility       PT Treatment Interventions DME instruction;Balance training;Gait training;Neuromuscular re-education;Stair training;Functional mobility training;Patient/family education;Therapeutic activities;Therapeutic exercise;Manual techniques    PT Goals (Current goals can be found in the Care Plan section)  Acute Rehab PT Goals Patient Stated Goal: improved breathing PT Goal Formulation: With patient/family Time For Goal Achievement: 07/05/24 Potential to Achieve Goals: Good    Frequency Min 2X/week     Co-evaluation               AM-PAC PT 6 Clicks Mobility  Outcome Measure Help needed turning from your back to your side while in a flat bed without using bedrails?: None Help needed moving from lying on your back to sitting on the side of a flat bed without using bedrails?: A Little Help needed moving to and from a bed to a chair (including a wheelchair)?: A Little Help needed standing up from a chair using your arms (e.g., wheelchair or bedside chair)?: A Little Help needed  to walk in hospital room?: A Little Help needed climbing 3-5 steps with a railing? : A Lot 6 Click Score: 18    End of Session Equipment Utilized During Treatment: Oxygen Activity Tolerance: Patient limited by fatigue (& SOB) Patient left: in bed;with call bell/phone within reach;with bed alarm set (sitting EOB)   PT Visit Diagnosis: Difficulty in walking, not elsewhere classified (R26.2);Other abnormalities of gait and mobility (R26.89)    Time: 8958-8946 PT Time Calculation (min) (ACUTE ONLY): 12 min   Charges:   PT Evaluation $PT Eval Low Complexity: 1 Low   PT General Charges $$ ACUTE PT VISIT: 1 Visit         James Clarke, PT, DPT 06/21/24, 11:02 AM   James Clarke 06/21/2024, 11:00 AM

## 2024-06-21 NOTE — TOC Initial Note (Signed)
 Transition of Care [facility]) - Initial/Assessment Note    Patient Details  Name: James Clarke MRN: [id number] Date of Birth: 1940-11-10  [facility]) CM/SW Contact:    Delphine KANDICE Bring, RN Phone Number: 06/21/2024, 4:27 PM  Clinical Narrative:                 Wife, Delores [phone], at bedside this morning during the assessment. Patient states that he use a cane, RW, and CPAP.  Wife states that he has HH with [facility]. They are in agreement with keeping them. Patient states his PCP is Dr. Jorje and [facility] is his pharmacy of choice. Patient states he was independent prior to admission. Patient states that he plan to go home at time of d/c.  Expected Discharge Plan: Home w Home Health Services     Patient Goals and CMS Choice            Expected Discharge Plan and Services       Living arrangements for the past 2 months: Single Family Home                                      Prior Living Arrangements/Services Living arrangements for the past 2 months: Single Family Home Lives with:: Spouse Patient language and need for interpreter reviewed:: No (English) Do you feel safe going back to the place where you live?: Yes          Current home services: DME, Home PT, Home RN    Activities of Daily Living   ADL Screening (condition at time of admission) Independently performs ADLs?: Yes (appropriate for developmental age) Is the patient deaf or have difficulty hearing?: No Does the patient have difficulty seeing, even when wearing glasses/contacts?: No Does the patient have difficulty concentrating, remembering, or making decisions?: No  Permission Sought/Granted                  Emotional Assessment Appearance:: Appears stated age Attitude/Demeanor/Rapport:  (cooperative) Affect (typically observed): Accepting, Appropriate, Calm Orientation: : Oriented to Self, Oriented to Place, Oriented to  Time, Oriented to Situation       Admission diagnosis:  CHF (congestive heart failure) (HCC) [I50.9] Hypoxia [R09.02] ESRD on dialysis (HCC) [N18.6, Z99.2] Dyspnea, unspecified type [R06.00] Acute on chronic congestive heart failure, unspecified heart failure type (HCC) [I50.9] Acute hypoxic respiratory failure (HCC) [J96.01] Patient Active Problem List   Diagnosis Date Noted   Acute hypoxic respiratory failure (HCC) 06/21/2024   CHF (congestive heart failure) (HCC) 05/26/2023   ESRD on dialysis (HCC) 04/29/2023   Hypokalemia 12/16/2022   Myocardial injury 12/14/2022   Goals of care, counseling/discussion 12/13/2022   Chronic kidney disease (CKD), stage IV (severe) (HCC) 08/23/2022   Acute on chronic diastolic CHF (congestive heart failure) (HCC) 06/12/2022   Swelling of limb 04/30/2022   Obstructive uropathy 03/10/2022   Diarrhea 03/04/2022   Skin irritation 03/01/2022   UTI (urinary tract infection) 02/28/2022   Hydronephrosis of left kidney    Flank pain 02/25/2022   Chronic diastolic CHF (congestive heart failure) (HCC) 02/25/2022   Acute renal failure superimposed on chronic kidney disease 02/25/2022   Coronary artery disease involving native coronary artery of native heart 01/10/2022   Right rotator cuff tear arthropathy 06/25/2021   Chronic right shoulder pain 06/25/2021   Localized osteoarthritis of right shoulder 06/25/2021   Chronic pain syndrome 06/25/2021   Acute systolic  CHF (congestive heart failure) (HCC) 10/03/2020   Kidney stones 02/16/2020   Anemia in chronic kidney disease 07/26/2019   Chronic kidney disease, stage III (moderate) (HCC) 07/26/2019   Edema of lower extremity 07/26/2019   Malignant essential hypertension 07/26/2019   Secondary hyperparathyroidism of renal origin 07/26/2019   Left ureteral calculus 07/26/2019   Old complex tear of medial meniscus of right knee 10/07/2018   Primary osteoarthritis of right knee 10/07/2018   Fatty liver 04/16/2018   Aortic atherosclerosis  04/16/2018   Obesity, Class III, BMI 40-49.9 (morbid obesity) (HCC) 11/11/2014   Microalbuminuria 11/11/2014   Long-term insulin  use (HCC) 11/11/2014   Perennial allergic rhinitis 09/12/2014   Type 2 diabetes mellitus with renal manifestations (HCC) 03/19/2014   Sleep apnea 03/19/2014   Hypertension 03/19/2014   Hyperlipemia 03/19/2014   PCP:  Jeffie Cheryl BRAVO, MD Pharmacy:   [facility] - [place], [place] - [address] [place] [address] Phone: [phone] Fax: [phone]  [id number], [place] - [address] AT [facility] [address] [facility] [place] [address] Phone: [phone] Fax: [phone]  [facility] - [place], [place] - [address] [place] [address] Phone: [phone] Fax: [phone]     Social Drivers of Health (SDOH) Social History: SDOH Screenings   Food Insecurity: No Food Insecurity (06/20/2024)  Housing: Low Risk  (06/20/2024)  Transportation Needs: No Transportation Needs (06/20/2024)  Utilities: Not At Risk (06/20/2024)  Depression (PHQ2-9): Low Risk  (10/30/2021)  Financial Resource Strain: Low Risk  (11/06/2023)   Received from [facility]  Tobacco Use: Low Risk  (06/20/2024)   SDOH Interventions:     Readmission Risk Interventions    05/28/2023   12:24 PM 02/27/2022    4:22 PM  Readmission Risk Prevention Plan  Transportation Screening Complete Complete  PCP or Specialist Appt within 3-5 Days Complete   HRI or Home Care Consult  Complete  Social Work Consult for Recovery Care Planning/Counseling Complete   Palliative Care Screening Not Applicable Not Applicable  Medication Review Oceanographer) Complete Complete

## 2024-06-21 NOTE — ED Notes (Signed)
 Pt given meal

## 2024-06-21 NOTE — Progress Notes (Signed)
*  PRELIMINARY RESULTS* Echocardiogram 2D Echocardiogram has been performed.  James Clarke 06/21/2024, 10:10 AM

## 2024-06-21 NOTE — ED Notes (Signed)
Gave report to dialysis 

## 2024-06-21 NOTE — Progress Notes (Signed)
 Heart Failure Navigator Progress Note  Assessed for Heart & Vascular TOC clinic readiness.  Current Advanced Heart Failure Team patient of Ellouise Class, FNP.  ESRD on HD. Recent HF Clinic appointment was 06/15/24.  Navigator will sign off at this time.  Charmaine Pines, RN, BSN [facility] Heart Failure Navigator Secure Chat Only

## 2024-06-21 NOTE — ED Notes (Signed)
 Pt requesting not to be stuck for CBG and instead use his DEXCOM for readings. Reading at 0800 today is 228

## 2024-06-21 NOTE — ED Notes (Signed)
 Pt eating meal at this time

## 2024-06-21 NOTE — ED Notes (Signed)
Pt back from dialysis at this time.

## 2024-06-21 NOTE — ED Notes (Signed)
 Pt's dexcom reading at 312. Pt requesting his insulin  at this time. Will give with crackers.

## 2024-06-21 NOTE — Progress Notes (Signed)
 [facility]  ROUNDING NOTE   Subjective:   James Clarke is a 83 y.o. male with past medical history of CAD, diabetes, asthma, sleep apnea, osteoarthritis, obesity, heart failure and end stage renal disease on hemodialysis. Last treatment received on Saturday. Patient presents to ED with shortness of breath and has been admitted for CHF (congestive heart failure) (HCC) [I50.9] Hypoxia [R09.02] ESRD on dialysis (HCC) [N18.6, Z99.2] Dyspnea, unspecified type [R06.00] Acute on chronic congestive heart failure, unspecified heart failure type (HCC) [I50.9] Acute hypoxic respiratory failure (HCC) [J96.01]  Patient seen laying in bed, wife at bedside. [name] and and receives outpatient dialysis treatments at [facility] on a MWF schedule, supervised by Dr. Marcelino.  Patient states he received treatment on Friday however due to equipment failure, treatment was terminated after an hour and a half.  Patient returned for remainder of treatment on Saturday and received full treatment with about 4 L of fluid removed.  Wife at bedside states patient has been developing progressive shortness of breath over the past week.  Denies increased fluid intake or sodium consumption.  States he has continued to take all prescribed medications including diuretics.  Patient appears winded during visit with conversation.  Room air at baseline, currently on 3 L nasal cannula.  Labs on ED arrival concerning for BNP greater than 2300, troponin 140, white count 13.2 with hemoglobin 9.7.  Respiratory panel negative for influenza, COVID-19, and RSV.  Chest x-ray shows mild cardiomegaly with chronic interstitial thickening.  Echo has been ordered.  We have been consulted to provide dialysis during this admission.  Objective:  Vital signs in last 24 hours:  Temp:  [97.4 F (36.3 C)-98 F (36.7 C)] 97.8 F (36.6 C) (12/01 1324) Pulse Rate:  [62-83] 62 (12/01 1430) Resp:  [17-25] 24 (12/01  1430) BP: (121-152)/(57-79) 121/67 (12/01 1430) SpO2:  [91 %-100 %] 97 % (12/01 1430) Weight:  [139 kg] 139 kg (12/01 1324)  Weight change:  Filed Weights   06/20/24 1455 06/21/24 1324  Weight: (!) 139 kg (!) 139 kg    Intake/Output: I/O last 3 completed shifts: In: 240 [P.O.:240] Out: 500 [Urine:500]   Intake/Output this shift:  No intake/output data recorded.  Physical Exam: General: NAD, ill-appearing  Head: Normocephalic, atraumatic. Moist oral mucosal membranes  Eyes: Anicteric  Lungs:  Rhonchorous, [place] O2  Heart: Regular rate and rhythm  Abdomen:  Soft, nontender  Extremities: Trace peripheral edema.  Neurologic: Awake, alert, conversant  Skin: Warm,dry, no rash  Access: Right IJ PermCath    Basic Metabolic Panel: Recent Labs  Lab 06/20/24 1516 06/21/24 0444  NA 141 137  K 3.6 3.9  CL 102 100  CO2 27 24  GLUCOSE 144* 253*  BUN 44* 47*  CREATININE 3.21* 3.65*  CALCIUM 8.9 8.5*  MG  --  3.9*    Liver Function Tests: Recent Labs  Lab 06/20/24 1516  AST 21  ALT 17  ALKPHOS 95  BILITOT 0.2  PROT 7.0  ALBUMIN 3.0*   No results for input(s): LIPASE, AMYLASE in the last 168 hours. No results for input(s): AMMONIA in the last 168 hours.  CBC: Recent Labs  Lab 06/20/24 1516  WBC 13.2*  NEUTROABS 10.5*  HGB 9.7*  HCT 31.6*  MCV 95.5  PLT 357    Cardiac Enzymes: No results for input(s): CKTOTAL, CKMB, CKMBINDEX, TROPONINI in the last 168 hours.  BNP: Invalid input(s): POCBNP  CBG: No results for input(s): GLUCAP in  the last 168 hours.  Microbiology: Results for orders placed or performed during the hospital encounter of 06/20/24  Resp panel by RT-PCR (RSV, Flu A&B, Covid) Anterior Nasal Swab     Status: None   Collection Time: 06/20/24  3:16 PM   Specimen: Anterior Nasal Swab  Result Value Ref Range Status   SARS Coronavirus 2 by RT PCR NEGATIVE NEGATIVE Final    Comment: (NOTE) SARS-CoV-2 target nucleic acids are  NOT DETECTED.  The SARS-CoV-2 RNA is generally detectable in upper respiratory specimens during the acute phase of infection. The lowest concentration of SARS-CoV-2 viral copies this assay can detect is 138 copies/mL. A negative result does not preclude SARS-Cov-2 infection and should not be used as the sole basis for treatment or other patient management decisions. A negative result may occur with  improper specimen collection/handling, submission of specimen other than nasopharyngeal swab, presence of viral mutation(s) within the areas targeted by this assay, and inadequate number of viral copies(<138 copies/mL). A negative result must be combined with clinical observations, patient history, and epidemiological information. The expected result is Negative.  Fact Sheet for Patients:  bloggercourse.com  Fact Sheet for Healthcare Providers:  seriousbroker.it  This test is no t yet approved or cleared by the [place]  FDA and  has been authorized for detection and/or diagnosis of SARS-CoV-2 by FDA under an Emergency Use Authorization (EUA). This EUA will remain  in effect (meaning this test can be used) for the duration of the COVID-19 declaration under Section 564(b)(1) of the Act, 21 U.S.C.section 360bbb-3(b)(1), unless the authorization is terminated  or revoked sooner.       Influenza A by PCR NEGATIVE NEGATIVE Final   Influenza B by PCR NEGATIVE NEGATIVE Final    Comment: (NOTE) The Xpert Xpress SARS-CoV-2/FLU/RSV plus assay is intended as an aid in the diagnosis of influenza from Nasopharyngeal swab specimens and should not be used as a sole basis for treatment. Nasal washings and aspirates are unacceptable for Xpert Xpress SARS-CoV-2/FLU/RSV testing.  Fact Sheet for Patients: bloggercourse.com  Fact Sheet for Healthcare Providers: seriousbroker.it  This test is not  yet approved or cleared by the [place]  FDA and has been authorized for detection and/or diagnosis of SARS-CoV-2 by FDA under an Emergency Use Authorization (EUA). This EUA will remain in effect (meaning this test can be used) for the duration of the COVID-19 declaration under Section 564(b)(1) of the Act, 21 U.S.C. section 360bbb-3(b)(1), unless the authorization is terminated or revoked.     Resp Syncytial Virus by PCR NEGATIVE NEGATIVE Final    Comment: (NOTE) Fact Sheet for Patients: bloggercourse.com  Fact Sheet for Healthcare Providers: seriousbroker.it  This test is not yet approved or cleared by the [place]  FDA and has been authorized for detection and/or diagnosis of SARS-CoV-2 by FDA under an Emergency Use Authorization (EUA). This EUA will remain in effect (meaning this test can be used) for the duration of the COVID-19 declaration under Section 564(b)(1) of the Act, 21 U.S.C. section 360bbb-3(b)(1), unless the authorization is terminated or revoked.  Performed at [facility], [address] [place], [place] [address]     Coagulation Studies: Recent Labs    06/20/24 1516  LABPROT 15.1  INR 1.1    Urinalysis: No results for input(s): COLORURINE, LABSPEC, PHURINE, GLUCOSEU, HGBUR, BILIRUBINUR, KETONESUR, PROTEINUR, UROBILINOGEN, NITRITE, LEUKOCYTESUR in the last 72 hours.  Invalid input(s): APPERANCEUR    Imaging: DG Chest 1 View Result Date: 06/20/2024 CLINICAL DATA:  Short of  breath. EXAM: CHEST  1 VIEW COMPARISON:  05/27/2023. FINDINGS: Mild enlargement of the cardiac silhouette. No mediastinal or hilar masses. Right internal jugular tunneled dual lumen central venous catheter is stable. Bilateral interstitial thickening is similar to the prior radiograph. No lung consolidation. No pleural effusion or pneumothorax. Skeletal structures are grossly intact.  IMPRESSION: 1. No acute cardiopulmonary disease. 2. Mild cardiomegaly and chronic interstitial thickening. Electronically Signed   By: Alm Parkins M.D.   On: 06/20/2024 16:00     Medications:    sodium chloride       apixaban  2.5 mg Oral BID   calcitRIOL   0.25 mcg Oral Daily   Chlorhexidine  Gluconate Cloth  6 each Topical Q0600   furosemide   40 mg Intravenous Q12H   insulin  aspart  0-6 Units Subcutaneous TID WC   insulin  glargine-yfgn  35 Units Subcutaneous QHS   isosorbide  mononitrate  30 mg Oral Daily   metoprolol  succinate  50 mg Oral Daily   midodrine  10 mg Oral Once per day on Monday Wednesday Friday   pravastatin   20 mg Oral q1800   sertraline   50 mg Oral QHS   sodium chloride  flush  3 mL Intravenous Q12H   tamsulosin   0.4 mg Oral Daily   sodium chloride , acetaminophen , fluticasone , ipratropium-albuterol , nitroGLYCERIN , ondansetron  (ZOFRAN ) IV, mouth rinse, sodium chloride  flush  Assessment/ Plan:  Mr. Alika Saladin is a 83 y.o.  male with past medical history of CAD, diabetes, asthma, sleep apnea, osteoarthritis, obesity, heart failure and end stage renal disease on hemodialysis. Last treatment received on Saturday. Patient presents to ED with shortness of breath and has been admitted for CHF (congestive heart failure) (HCC) [I50.9] Hypoxia [R09.02] ESRD on dialysis (HCC) [N18.6, Z99.2] Dyspnea, unspecified type [R06.00] Acute on chronic congestive heart failure, unspecified heart failure type (HCC) [I50.9] Acute hypoxic respiratory failure (HCC) [J96.01]  [facility]/MWF/Rt permcath  End stage renal disease on hemodialysis. Last treatment on Saturday. Will perform scheduled dialysis today, UF goal 2L as tolerated. Will determine need for dialysis tomorrow.   2. Acute respiratory failure, chest x ray shows mild cardiomegaly. History of combined heart failure, ECHO ordered. Room air at baseline, requiring 3L [place]. Will attempt fluid removal with dialysis.   3.  Anemia of chronic kidney disease Lab Results  Component Value Date   HGB 9.7 (L) 06/20/2024    Hgb within optimal range. Will monitor for need of ESA during this admission.   4. Secondary Hyperparathyroidism: with outpatient labs: None available Lab Results  Component Value Date   CALCIUM 8.5 (L) 06/21/2024   CAION 1.19 06/03/2024   PHOS 4.4 12/13/2022    Calcium acceptable, will continue to monitor   LOS: 1 Jerard Bays 12/1/20253:17 PM

## 2024-06-21 NOTE — ED Notes (Signed)
Pt going to dialysis at this time.

## 2024-06-21 NOTE — Progress Notes (Signed)
 PROGRESS NOTE    James Clarke  FMW:[id number] DOB: 07-Jul-1941 DOA: 06/20/2024 PCP: Jeffie Cheryl BRAVO, MD    Brief Narrative:  83 y.o. male with medical history significant of Obesity, end-stage renal disease on hemodialysis MWF, HFpEF, diabetes mellitus on insulin  therapy, hyperlipidemia.  He presented to the emergency room from home with complaints of shortness of breath.   Per patient, he had presented for his scheduled hemodialysis on Friday, however, yeah he did not complete his hemodialysis session due to equipment failure.  He was asked to come back the following day and completed 4 hours of hemodialysis.  Hemodialysis was uneventful.  Today however, he woke up feeling short of breath.  Symptoms worsened with minimal exertion to and from bathroom.  On account of the symptoms, patient was brought to the emergency room to be further evaluated.  He denied any worsening lower extremity edema.  He however admitted to chest tightness.  No associated palpitations, nausea or vomiting.  Denied any associated wheezing He admits to compliance with his home medication.   Assessment & Plan:   84 year old male with history of end-stage renal disease, HFpEF, hypertension, diabetes mellitus, obesity who presents on account of worsening shortness of breath after his hemodialysis schedule was revised due to equipment failure   #1 Acute hypoxemic respiratory failure: Most likely due to pulmonary vascular congestion versus fluid overload in the context of end-stage renal disease and chronic combined diastolic/systolic congestive failure.  Known ejection fraction of 30 to 35%.   Plan: Lasix  40 mg IV twice daily Nephrology contacted.  HD today Wean oxygen as tolerated.  If on room air anticipate discharge 12/2  #2.Elevated Cardiac markers-review of records suggest, patient has chronically elevated cardiac markers.  Current elevated cardiac markers been consistent with possible demand from palpitations.   No chest pain   #3.  End-stage renal disease on hemodialysis: MWF Nephrology on consult   #4.  Type 2 diabetes mellitus on insulin .  Complicated by diabetic nephropathy, neuropathy.  Basal bolus regimen.  CBG AC and at bedtime  #5.  Paroxysmal atrial fibrillation: Rate and rhythm controlled at this time.  Continue rate control agents.  Continue low-dose Eliquis   #6.  Hypotension with hemodialysis Stable with midodrine   #7.  Nonobstructive coronary artery disease: Patient is on aspirin  and statins.  Can use   #8.  Leukocytosis: Chronic with unclear etiology  #9.  Morbid obesity.  Complicating factor in overall care and prognosis.    DVT prophylaxis: Eliquis Code Status: DNR Family Communication: Spouse at bedside 12/1 Disposition Plan: Status is: Inpatient Remains inpatient appropriate because: Acute hypoxemic respiratory failure secondary to fluid overload state.  Dialysis today.  Possible discharge/2.   Level of care: Telemetry  Consultants:  Nephrology  Procedures:  Hemodialysis  Antimicrobials: None   Subjective: Seen and examined.  Resting in bed.  Reports mild improvement in shortness of breath.  Wife at bedside.  Objective: Vitals:   06/21/24 0730 06/21/24 1000 06/21/24 1009 06/21/24 1100  BP: (!) 142/71 (!) 149/78  (!) 139/58  Pulse: 65 73  66  Resp: 20 20  19   Temp:   97.6 F (36.4 C) 97.7 F (36.5 C)  TempSrc:    Oral  SpO2: 100% 97%  99%  Weight:      Height:        Intake/Output Summary (Last 24 hours) at 06/21/2024 1201 Last data filed at 06/21/2024 0242 Gross per 24 hour  Intake 240 ml  Output 500 ml  Net -260 ml   Filed Weights   06/20/24 1455  Weight: (!) 139 kg    Examination:  General exam: Appears mildly dyspneic Respiratory system: Crackles bilaterally.  Normal work of breathing.  2 L Cardiovascular system: S1-S2, RRR, no murmurs, no pedal edema Gastrointestinal system: Obese, soft, nontender/nondistended, normal bowel  sounds Central nervous system: Alert and oriented. No focal neurological deficits. Extremities: Symmetric 5 x 5 power. Skin: No rashes, lesions or ulcers Psychiatry: Judgement and insight appear normal. Mood & affect appropriate.     Data Reviewed: I have personally reviewed following labs and imaging studies  CBC: Recent Labs  Lab 06/20/24 1516  WBC 13.2*  NEUTROABS 10.5*  HGB 9.7*  HCT 31.6*  MCV 95.5  PLT 357   Basic Metabolic Panel: Recent Labs  Lab 06/20/24 1516 06/21/24 0444  NA 141 137  K 3.6 3.9  CL 102 100  CO2 27 24  GLUCOSE 144* 253*  BUN 44* 47*  CREATININE 3.21* 3.65*  CALCIUM 8.9 8.5*  MG  --  3.9*   GFR: Estimated Creatinine Clearance: 21.3 mL/min (A) (by C-G formula based on SCr of 3.65 mg/dL (H)). Liver Function Tests: Recent Labs  Lab 06/20/24 1516  AST 21  ALT 17  ALKPHOS 95  BILITOT 0.2  PROT 7.0  ALBUMIN 3.0*   No results for input(s): LIPASE, AMYLASE in the last 168 hours. No results for input(s): AMMONIA in the last 168 hours. Coagulation Profile: Recent Labs  Lab 06/20/24 1516  INR 1.1   Cardiac Enzymes: No results for input(s): CKTOTAL, CKMB, CKMBINDEX, TROPONINI in the last 168 hours. BNP (last 3 results) Recent Labs    06/20/24 1516  PROBNP 23,719.0*   HbA1C: No results for input(s): HGBA1C in the last 72 hours. CBG: No results for input(s): GLUCAP in the last 168 hours. Lipid Profile: No results for input(s): CHOL, HDL, LDLCALC, TRIG, CHOLHDL, LDLDIRECT in the last 72 hours. Thyroid  Function Tests: No results for input(s): TSH, T4TOTAL, FREET4, T3FREE, THYROIDAB in the last 72 hours. Anemia Panel: No results for input(s): VITAMINB12, FOLATE, FERRITIN, TIBC, IRON, RETICCTPCT in the last 72 hours. Sepsis Labs: No results for input(s): PROCALCITON, LATICACIDVEN in the last 168 hours.  Recent Results (from the past 240 hours)  Resp panel by RT-PCR (RSV, Flu  A&B, Covid) Anterior Nasal Swab     Status: None   Collection Time: 06/20/24  3:16 PM   Specimen: Anterior Nasal Swab  Result Value Ref Range Status   SARS Coronavirus 2 by RT PCR NEGATIVE NEGATIVE Final    Comment: (NOTE) SARS-CoV-2 target nucleic acids are NOT DETECTED.  The SARS-CoV-2 RNA is generally detectable in upper respiratory specimens during the acute phase of infection. The lowest concentration of SARS-CoV-2 viral copies this assay can detect is 138 copies/mL. A negative result does not preclude SARS-Cov-2 infection and should not be used as the sole basis for treatment or other patient management decisions. A negative result may occur with  improper specimen collection/handling, submission of specimen other than nasopharyngeal swab, presence of viral mutation(s) within the areas targeted by this assay, and inadequate number of viral copies(<138 copies/mL). A negative result must be combined with clinical observations, patient history, and epidemiological information. The expected result is Negative.  Fact Sheet for Patients:  bloggercourse.com  Fact Sheet for Healthcare Providers:  seriousbroker.it  This test is no t yet approved or cleared by the [place]  FDA and  has been authorized for detection and/or diagnosis of SARS-CoV-2 by  FDA under an Emergency Use Authorization (EUA). This EUA will remain  in effect (meaning this test can be used) for the duration of the COVID-19 declaration under Section 564(b)(1) of the Act, 21 U.S.C.section 360bbb-3(b)(1), unless the authorization is terminated  or revoked sooner.       Influenza A by PCR NEGATIVE NEGATIVE Final   Influenza B by PCR NEGATIVE NEGATIVE Final    Comment: (NOTE) The Xpert Xpress SARS-CoV-2/FLU/RSV plus assay is intended as an aid in the diagnosis of influenza from Nasopharyngeal swab specimens and should not be used as a sole basis for treatment.  Nasal washings and aspirates are unacceptable for Xpert Xpress SARS-CoV-2/FLU/RSV testing.  Fact Sheet for Patients: bloggercourse.com  Fact Sheet for Healthcare Providers: seriousbroker.it  This test is not yet approved or cleared by the [place]  FDA and has been authorized for detection and/or diagnosis of SARS-CoV-2 by FDA under an Emergency Use Authorization (EUA). This EUA will remain in effect (meaning this test can be used) for the duration of the COVID-19 declaration under Section 564(b)(1) of the Act, 21 U.S.C. section 360bbb-3(b)(1), unless the authorization is terminated or revoked.     Resp Syncytial Virus by PCR NEGATIVE NEGATIVE Final    Comment: (NOTE) Fact Sheet for Patients: bloggercourse.com  Fact Sheet for Healthcare Providers: seriousbroker.it  This test is not yet approved or cleared by the [place]  FDA and has been authorized for detection and/or diagnosis of SARS-CoV-2 by FDA under an Emergency Use Authorization (EUA). This EUA will remain in effect (meaning this test can be used) for the duration of the COVID-19 declaration under Section 564(b)(1) of the Act, 21 U.S.C. section 360bbb-3(b)(1), unless the authorization is terminated or revoked.  Performed at [facility], [address] [place], [place] [address]          Radiology Studies: DG Chest 1 View Result Date: 06/20/2024 CLINICAL DATA:  Short of breath. EXAM: CHEST  1 VIEW COMPARISON:  05/27/2023. FINDINGS: Mild enlargement of the cardiac silhouette. No mediastinal or hilar masses. Right internal jugular tunneled dual lumen central venous catheter is stable. Bilateral interstitial thickening is similar to the prior radiograph. No lung consolidation. No pleural effusion or pneumothorax. Skeletal structures are grossly intact. IMPRESSION: 1. No acute cardiopulmonary disease.  2. Mild cardiomegaly and chronic interstitial thickening. Electronically Signed   By: Alm Parkins M.D.   On: 06/20/2024 16:00        Scheduled Meds:  apixaban  2.5 mg Oral BID   calcitRIOL   0.25 mcg Oral Daily   Chlorhexidine  Gluconate Cloth  6 each Topical Q0600   furosemide   40 mg Intravenous Q12H   insulin  aspart  0-6 Units Subcutaneous TID WC   insulin  glargine-yfgn  35 Units Subcutaneous QHS   isosorbide  mononitrate  30 mg Oral Daily   metoprolol  succinate  50 mg Oral Daily   midodrine  10 mg Oral Once per day on Monday Wednesday Friday   pravastatin   20 mg Oral q1800   sertraline   50 mg Oral QHS   sodium chloride  flush  3 mL Intravenous Q12H   tamsulosin   0.4 mg Oral Daily   Continuous Infusions:  sodium chloride        LOS: 1 day    Calvin KATHEE Robson, MD [facility]   If 7PM-7AM, please contact night-coverage  06/21/2024, 12:01 PM

## 2024-06-21 NOTE — ED Notes (Signed)
 Pt provided with pericare, clean primifit external catheter, and linen change by this RN and NT [employer].

## 2024-06-21 NOTE — ED Notes (Signed)
 Consent signed for dialysis and placed at bedside.

## 2024-06-21 NOTE — ED Notes (Signed)
 PT's lunch delivered

## 2024-06-22 DIAGNOSIS — I5043 Acute on chronic combined systolic (congestive) and diastolic (congestive) heart failure: Secondary | ICD-10-CM | POA: Diagnosis not present

## 2024-06-22 LAB — BASIC METABOLIC PANEL WITH GFR
Anion gap: 12 (ref 5–15)
BUN: 34 mg/dL — ABNORMAL HIGH (ref 8–23)
CO2: 28 mmol/L (ref 22–32)
Calcium: 8.5 mg/dL — ABNORMAL LOW (ref 8.9–10.3)
Chloride: 99 mmol/L (ref 98–111)
Creatinine, Ser: 2.98 mg/dL — ABNORMAL HIGH (ref 0.61–1.24)
GFR, Estimated: 20 mL/min — ABNORMAL LOW (ref >60)
Glucose, Bld: 202 mg/dL — ABNORMAL HIGH (ref 70–99)
Potassium: 3.8 mmol/L (ref 3.5–5.1)
Sodium: 138 mmol/L (ref 135–145)

## 2024-06-22 LAB — GLUCOSE, CAPILLARY
Glucose-Capillary: 207 mg/dL — ABNORMAL HIGH (ref 70–99)
Glucose-Capillary: 241 mg/dL — ABNORMAL HIGH (ref 70–99)
Glucose-Capillary: 250 mg/dL — ABNORMAL HIGH (ref 70–99)
Glucose-Capillary: 251 mg/dL — ABNORMAL HIGH (ref 70–99)
Glucose-Capillary: 311 mg/dL — ABNORMAL HIGH (ref 70–99)

## 2024-06-22 LAB — CBC
HCT: 30.1 % — ABNORMAL LOW (ref 39.0–52.0)
Hemoglobin: 9.5 g/dL — ABNORMAL LOW (ref 13.0–17.0)
MCH: 29.3 pg (ref 26.0–34.0)
MCHC: 31.6 g/dL (ref 30.0–36.0)
MCV: 92.9 fL (ref 80.0–100.0)
Platelets: 353 K/uL (ref 150–400)
RBC: 3.24 MIL/uL — ABNORMAL LOW (ref 4.22–5.81)
RDW: 13.2 % (ref 11.5–15.5)
WBC: 12.1 K/uL — ABNORMAL HIGH (ref 4.0–10.5)
nRBC: 0 % (ref 0.0–0.2)

## 2024-06-22 LAB — HEPATITIS B SURFACE ANTIBODY, QUANTITATIVE: Hep B S AB Quant (Post): <3.5 m[IU]/mL — ABNORMAL LOW

## 2024-06-22 MED ORDER — HEPARIN SODIUM (PORCINE) 1000 UNIT/ML DIALYSIS: 1000.0000 [IU] | INTRAMUSCULAR | Status: DC | PRN

## 2024-06-22 MED ORDER — INSULIN GLARGINE-YFGN 100 UNIT/ML ~~LOC~~ SOLN
38.0000 [IU] | Freq: Every day | SUBCUTANEOUS | Status: DC
  Administered 2024-06-22 – 2024-06-23 (×2): 38 [IU] via SUBCUTANEOUS
  Filled 2024-06-22 (×4): qty 0.38

## 2024-06-22 MED ORDER — HEPARIN SODIUM (PORCINE) 1000 UNIT/ML IJ SOLN
INTRAMUSCULAR | Status: AC
  Filled 2024-06-22: qty 4

## 2024-06-22 MED ORDER — ALTEPLASE 2 MG IJ SOLR: 2.0000 mg | Freq: Once | INTRAMUSCULAR | Status: DC | PRN

## 2024-06-22 MED ORDER — INSULIN ASPART 100 UNIT/ML IJ SOLN
4.0000 [IU] | Freq: Three times a day (TID) | INTRAMUSCULAR | Status: DC
  Administered 2024-06-22 – 2024-06-24 (×3): 4 [IU] via SUBCUTANEOUS
  Filled 2024-06-22 (×2): qty 4

## 2024-06-22 NOTE — Progress Notes (Signed)
 Patient requested bed alarm turned off.  RN stated that it would be activated unless the patient verbalized refusing to have the bed alarm and that nursing staff would comply since the patient is of sound mind.  Patient instructed RN to disarm the bed alarm and verbalized understanding that medical staff think it is more appropriate to utilize the bed alarm but still wanted it disarmed.  RN disarmed bed alarm and instructed patient atht he should still call for assistance prior to mobilizing out of bed.  Patient stated that it was silly to call and wait for assistance when he could do it himself.  RN responded that staff would like the patient to return home safely without a fall incident.  Patient verbalized continued preference to remain without a bed alarm.

## 2024-06-22 NOTE — Progress Notes (Signed)
 PROGRESS NOTE    James Clarke  FMW:[id number] DOB: [birth year] DOA: 06/20/2024 PCP: Jeffie Cheryl BRAVO, MD    Brief Narrative:  83 y.o. male with medical history significant of Obesity, end-stage renal disease on hemodialysis MWF, HFpEF, diabetes mellitus on insulin  therapy, hyperlipidemia.  He presented to the emergency room from home with complaints of shortness of breath.   Per patient, he had presented for his scheduled hemodialysis on Friday, however, yeah he did not complete his hemodialysis session due to equipment failure.  He was asked to come back the following day and completed 4 hours of hemodialysis.  Hemodialysis was uneventful.  Today however, he woke up feeling short of breath.  Symptoms worsened with minimal exertion to and from bathroom.  On account of the symptoms, patient was brought to the emergency room to be further evaluated.  He denied any worsening lower extremity edema.  He however admitted to chest tightness.  No associated palpitations, nausea or vomiting.  Denied any associated wheezing He admits to compliance with his home medication.   Assessment & Plan:   83 year old male with history of end-stage renal disease, HFpEF, hypertension, diabetes mellitus, obesity who presents on account of worsening shortness of breath after his hemodialysis schedule was revised due to equipment failure   #1 Acute hypoxemic respiratory failure:  Most likely due to pulmonary vascular congestion versus fluid overload in the context of end-stage renal disease and chronic combined diastolic/systolic congestive failure.  Known ejection fraction of 30 to 35%.   Plan: Dialysis daily x 3 days per nephrology.  Can stop Lasix . Continue to wean oxygen as tolerated.  Remains on 2 L.  #2.Elevated Cardiac markers- review of records suggest, patient has chronically elevated cardiac markers.  Current elevated cardiac markers been consistent with possible demand from palpitations.  No chest  pain   #3.  End-stage renal disease on hemodialysis: MWF Nephrology on consult HD per nephro   #4.  Type 2 diabetes mellitus on insulin .  Complicated by diabetic nephropathy, neuropathy.  Basal bolus regimen.  Increase Semglee  to 38 units nightly, NovoLog  4 units 3 times daily with meals, sensitive sliding scale, CBG AC and at bedtime  #5.  Paroxysmal atrial fibrillation: Rate and rhythm controlled at this time.  Continue rate control agents.  Continue low-dose Eliquis    #6.  Hypotension with hemodialysis Stable with midodrine    #7.  Nonobstructive coronary artery disease: Patient is on aspirin  and statins.  Can use   #8.  Leukocytosis: Chronic with unclear etiology  #9.  Morbid obesity.  Complicating factor in overall care and prognosis.    DVT prophylaxis: Eliquis  Code Status: DNR Family Communication: Spouse at bedside 12/1, 12/2 Disposition Plan: Status is: Inpatient Remains inpatient appropriate because: Acute hypoxemic respiratory failure secondary to fluid overloaded state.  Dialysis per nephrology.   Level of care: Telemetry  Consultants:  Nephrology  Procedures:  Hemodialysis  Antimicrobials: None   Subjective: And examined.  Resting in bed.  Continues to endorse shortness of breath.  Remains on oxygen.  Wife at bedside.  Objective: Vitals:   06/22/24 1330 06/22/24 1400 06/22/24 1430 06/22/24 1500  BP: (!) 144/68 (!) 143/71 135/63 138/69  Pulse: 69 68 63 64  Resp: (!) 21 (!) 22 (!) 22 19  Temp:      TempSrc:      SpO2: 99% 100% 100% 100%  Weight:      Height:        Intake/Output Summary (Last 24 hours) at 06/22/2024 1518  Last data filed at 06/21/2024 1738 Gross per 24 hour  Intake --  Output 5300 ml  Net -5300 ml   Filed Weights   06/21/24 1720 06/22/24 0457 06/22/24 1237  Weight: (!) 137.3 kg 135.5 kg 134.3 kg    Examination:  General exam: Appears short of breath Respiratory system: Scattered crackles bilaterally.  Normal work of  breathing.  2 L Cardiovascular system: S1-S2, RRR, no murmurs, no pedal edema Gastrointestinal system: Obese, soft, nontender/nondistended, normal bowel sounds Central nervous system: Alert and oriented. No focal neurological deficits. Extremities: Symmetric 5 x 5 power. Skin: No rashes, lesions or ulcers Psychiatry: Judgement and insight appear normal. Mood & affect appropriate.     Data Reviewed: I have personally reviewed following labs and imaging studies  CBC: Recent Labs  Lab 06/20/24 1516 06/22/24 1250  WBC 13.2* 12.1*  NEUTROABS 10.5*  --   HGB 9.7* 9.5*  HCT 31.6* 30.1*  MCV 95.5 92.9  PLT 357 353   Basic Metabolic Panel: Recent Labs  Lab 06/20/24 1516 06/21/24 0444 06/22/24 0516  NA 141 137 138  K 3.6 3.9 3.8  CL 102 100 99  CO2 27 24 28   GLUCOSE 144* 253* 202*  BUN 44* 47* 34*  CREATININE 3.21* 3.65* 2.98*  CALCIUM 8.9 8.5* 8.5*  MG  --  3.9*  --    GFR: Estimated Creatinine Clearance: 25.5 mL/min (A) (by C-G formula based on SCr of 2.98 mg/dL (H)). Liver Function Tests: Recent Labs  Lab 06/20/24 1516  AST 21  ALT 17  ALKPHOS 95  BILITOT 0.2  PROT 7.0  ALBUMIN 3.0*   No results for input(s): LIPASE, AMYLASE in the last 168 hours. No results for input(s): AMMONIA in the last 168 hours. Coagulation Profile: Recent Labs  Lab 06/20/24 1516  INR 1.1   Cardiac Enzymes: No results for input(s): CKTOTAL, CKMB, CKMBINDEX, TROPONINI in the last 168 hours. BNP (last 3 results) Recent Labs    06/20/24 1516  PROBNP 23,719.0*   HbA1C: Recent Labs    06/20/24 1525  HGBA1C 8.5*   CBG: Recent Labs  Lab 06/22/24 0043 06/22/24 0946 06/22/24 1120  GLUCAP 251* 207* 311*   Lipid Profile: No results for input(s): CHOL, HDL, LDLCALC, TRIG, CHOLHDL, LDLDIRECT in the last 72 hours. Thyroid  Function Tests: No results for input(s): TSH, T4TOTAL, FREET4, T3FREE, THYROIDAB in the last 72 hours. Anemia Panel: No  results for input(s): VITAMINB12, FOLATE, FERRITIN, TIBC, IRON, RETICCTPCT in the last 72 hours. Sepsis Labs: No results for input(s): PROCALCITON, LATICACIDVEN in the last 168 hours.  Recent Results (from the past 240 hours)  Resp panel by RT-PCR (RSV, Flu A&B, Covid) Anterior Nasal Swab     Status: None   Collection Time: 06/20/24  3:16 PM   Specimen: Anterior Nasal Swab  Result Value Ref Range Status   SARS Coronavirus 2 by RT PCR NEGATIVE NEGATIVE Final    Comment: (NOTE) SARS-CoV-2 target nucleic acids are NOT DETECTED.  The SARS-CoV-2 RNA is generally detectable in upper respiratory specimens during the acute phase of infection. The lowest concentration of SARS-CoV-2 viral copies this assay can detect is 138 copies/mL. A negative result does not preclude SARS-Cov-2 infection and should not be used as the sole basis for treatment or other patient management decisions. A negative result may occur with  improper specimen collection/handling, submission of specimen other than nasopharyngeal swab, presence of viral mutation(s) within the areas targeted by this assay, and inadequate number of viral copies(<138 copies/mL). A  negative result must be combined with clinical observations, patient history, and epidemiological information. The expected result is Negative.  Fact Sheet for Patients:  bloggercourse.com  Fact Sheet for Healthcare Providers:  seriousbroker.it  This test is no t yet approved or cleared by the [place]  FDA and  has been authorized for detection and/or diagnosis of SARS-CoV-2 by FDA under an Emergency Use Authorization (EUA). This EUA will remain  in effect (meaning this test can be used) for the duration of the COVID-19 declaration under Section 564(b)(1) of the Act, 21 U.S.C.section 360bbb-3(b)(1), unless the authorization is terminated  or revoked sooner.       Influenza A by PCR  NEGATIVE NEGATIVE Final   Influenza B by PCR NEGATIVE NEGATIVE Final    Comment: (NOTE) The Xpert Xpress SARS-CoV-2/FLU/RSV plus assay is intended as an aid in the diagnosis of influenza from Nasopharyngeal swab specimens and should not be used as a sole basis for treatment. Nasal washings and aspirates are unacceptable for Xpert Xpress SARS-CoV-2/FLU/RSV testing.  Fact Sheet for Patients: bloggercourse.com  Fact Sheet for Healthcare Providers: seriousbroker.it  This test is not yet approved or cleared by the [place]  FDA and has been authorized for detection and/or diagnosis of SARS-CoV-2 by FDA under an Emergency Use Authorization (EUA). This EUA will remain in effect (meaning this test can be used) for the duration of the COVID-19 declaration under Section 564(b)(1) of the Act, 21 U.S.C. section 360bbb-3(b)(1), unless the authorization is terminated or revoked.     Resp Syncytial Virus by PCR NEGATIVE NEGATIVE Final    Comment: (NOTE) Fact Sheet for Patients: bloggercourse.com  Fact Sheet for Healthcare Providers: seriousbroker.it  This test is not yet approved or cleared by the [place]  FDA and has been authorized for detection and/or diagnosis of SARS-CoV-2 by FDA under an Emergency Use Authorization (EUA). This EUA will remain in effect (meaning this test can be used) for the duration of the COVID-19 declaration under Section 564(b)(1) of the Act, 21 U.S.C. section 360bbb-3(b)(1), unless the authorization is terminated or revoked.  Performed at [facility], [address] [place], [place] [address]          Radiology Studies: ECHOCARDIOGRAM COMPLETE Result Date: 06/21/2024    ECHOCARDIOGRAM REPORT   Patient Name:   James Clarke Date of Exam: 06/21/2024 Medical Rec #:  [id number]       Height:       69.0 in Accession #:    [id number]       Weight:       306.4 lb Date of Birth:  26-Dec-1940      BSA:          2.477 m Patient Age:    82 years        BP:           142/71 mmHg Patient Gender: M               HR:           65 bpm. Exam Location:  [facility] Procedure: 2D Echo, Color Doppler and Cardiac Doppler (Both Spectral and Color            Flow Doppler were utilized during procedure). Indications:     CHF--acute systolic I50.21  History:         Patient has prior history of Echocardiogram examinations, most                  recent 05/26/2023. Risk Factors:Dyslipidemia  and Diabetes.  Sonographer:     Christopher Furnace Referring Phys:  [id number] MAUDE MARLA DART Diagnosing Phys: Cara JONETTA Lovelace MD  Sonographer Comments: Technically challenging study due to limited acoustic windows, no apical window, no subcostal window and patient is obese. IMPRESSIONS  1. Technically difficult study.  2. Left ventricular ejection fraction, by estimation, is 35 to 40%. The left ventricle has moderately decreased function. The left ventricle demonstrates global hypokinesis. The left ventricular internal cavity size was moderately dilated. There is moderate concentric left ventricular hypertrophy. Left ventricular diastolic function could not be evaluated.  3. Right ventricular systolic function is normal. The right ventricular size is normal.  4. The mitral valve is normal in structure. No evidence of mitral valve regurgitation.  5. The aortic valve is normal in structure. Aortic valve regurgitation is not visualized. Conclusion(s)/Recommendation(s): Poor windows for evaluation of left ventricular function by transthoracic echocardiography. Would recommend an alternative means of evaluation. FINDINGS  Left Ventricle: Left ventricular ejection fraction, by estimation, is 35 to 40%. The left ventricle has moderately decreased function. The left ventricle demonstrates global hypokinesis. Strain was performed and the global longitudinal strain is indeterminate. The left ventricular  internal cavity size was moderately dilated. There is moderate concentric left ventricular hypertrophy. Left ventricular diastolic function could not be evaluated. Right Ventricle: The right ventricular size is normal. No increase in right ventricular wall thickness. Right ventricular systolic function is normal. Left Atrium: Left atrial size was normal in size. Right Atrium: Right atrial size was normal in size. Pericardium: The pericardium was not well visualized. Mitral Valve: The mitral valve is normal in structure. No evidence of mitral valve regurgitation. Tricuspid Valve: The tricuspid valve is normal in structure. Tricuspid valve regurgitation is not demonstrated. Aortic Valve: The aortic valve is normal in structure. Aortic valve regurgitation is not visualized. Pulmonic Valve: The pulmonic valve was normal in structure. Pulmonic valve regurgitation is not visualized. Aorta: The ascending aorta was not well visualized. IAS/Shunts: No atrial level shunt detected by color flow Doppler. Additional Comments: Technically difficult study. 3D was performed not requiring image post processing on an independent workstation and was indeterminate.  LEFT VENTRICLE PLAX 2D LVIDd:         5.80 cm LVIDs:         4.70 cm LV PW:         1.40 cm LV IVS:        1.60 cm LVOT diam:     2.10 cm LVOT Area:     3.46 cm  LEFT ATRIUM         Index LA diam:    4.20 cm 1.70 cm/m   AORTA Ao Root diam: 3.10 cm  SHUNTS Systemic Diam: 2.10 cm Cara JONETTA Lovelace MD Electronically signed by Cara JONETTA Lovelace MD Signature Date/Time: 06/21/2024/11:25:48 PM    Final    DG Chest 1 View Result Date: 06/20/2024 CLINICAL DATA:  Short of breath. EXAM: CHEST  1 VIEW COMPARISON:  05/27/2023. FINDINGS: Mild enlargement of the cardiac silhouette. No mediastinal or hilar masses. Right internal jugular tunneled dual lumen central venous catheter is stable. Bilateral interstitial thickening is similar to the prior radiograph. No lung consolidation. No  pleural effusion or pneumothorax. Skeletal structures are grossly intact. IMPRESSION: 1. No acute cardiopulmonary disease. 2. Mild cardiomegaly and chronic interstitial thickening. Electronically Signed   By: Alm Parkins M.D.   On: 06/20/2024 16:00        Scheduled Meds:  apixaban  2.5 mg Oral  BID   calcitRIOL   0.25 mcg Oral Daily   Chlorhexidine  Gluconate Cloth  6 each Topical Q0600   furosemide   40 mg Intravenous Q12H   insulin  aspart  0-6 Units Subcutaneous TID WC   insulin  aspart  4 Units Subcutaneous TID WC   insulin  glargine-yfgn  38 Units Subcutaneous QHS   isosorbide  mononitrate  30 mg Oral Daily   metoprolol  succinate  50 mg Oral Daily   midodrine  10 mg Oral Once per day on Monday Wednesday Friday   pravastatin   20 mg Oral q1800   sertraline   50 mg Oral QHS   sodium chloride  flush  3 mL Intravenous Q12H   tamsulosin   0.4 mg Oral Daily   Continuous Infusions:     LOS: 2 days    Calvin KATHEE Robson, MD [facility]   If 7PM-7AM, please contact night-coverage  06/22/2024, 3:18 PM

## 2024-06-22 NOTE — Progress Notes (Signed)
  Received patient in bed to unit.   Informed consent signed and in chart.    TX duration: 3hrs     Transported back to floor  Hand-off given to patient's nurse. No c/o no acute distress noted    Access used: R HD catheter  Access issues: none   Total UF removed: 2.5L Medication(s) given: none Post HD VS: wnl Post HD weight: 132.0kg     Olivia Hurst LPN [facility]

## 2024-06-22 NOTE — Inpatient Diabetes Management (Signed)
 Inpatient Diabetes Program Recommendations  AACE/ADA: New Consensus Statement on Inpatient Glycemic Control   Target Ranges:  Prepandial:   less than 140 mg/dL      Peak postprandial:   less than 180 mg/dL (1-2 hours)      Critically ill patients:  140 - 180 mg/dL    Latest Reference Range & Units 06/22/24 00:43 06/22/24 09:46 06/22/24 11:20  Glucose-Capillary 70 - 99 mg/dL 748 (H) 792 (H) 688 (H)   Review of Glycemic Control  Diabetes history: DM2 Outpatient Diabetes medications: Tresiba 35 units at bedtime, Novolog  14 units TID with meals (per home med list pt is taking 35 units with breakfast, 25 units with lunch, 35 units with supper) Current orders for Inpatient glycemic control: Semglee  35 units at bedtime, Novolog  0-6 units TID with meals  Inpatient Diabetes Program Recommendations:    Insulin : CBG up to 311 mg/dl at 88:79 today.  Please consider increasing Semglee  to 38 units at bedtime, adding Novolog  4 units TID with meals for meal coverage if patient eats at least 50% of meals, and adding Novolog  0-5 units at bedtime.  Thanks, Earnie Gainer, RN, MSN, CDCES Diabetes Coordinator Inpatient Diabetes Program [phone] (Team Pager from 8am to 5pm)

## 2024-06-22 NOTE — Progress Notes (Signed)
 [facility]  ROUNDING NOTE   Subjective:   James Clarke is a 83 y.o. male with past medical history of CAD, diabetes, asthma, sleep apnea, osteoarthritis, obesity, heart failure and end stage renal disease on hemodialysis. Last treatment received on Saturday. Patient presents to ED with shortness of breath and has been admitted for CHF (congestive heart failure) (HCC) [I50.9] Hypoxia [R09.02] ESRD on dialysis (HCC) [N18.6, Z99.2] Dyspnea, unspecified type [R06.00] Acute on chronic congestive heart failure, unspecified heart failure type (HCC) [I50.9] Acute hypoxic respiratory failure (HCC) [J96.01]  Patient seen laying in bed, wife at bedside. [name] and and receives outpatient dialysis treatments at [facility] on a MWF schedule, supervised by Dr. Marcelino.    Update: Patient seen and evaluated during dialysis   HEMODIALYSIS FLOWSHEET:  Blood Flow Rate (mL/min): 400 mL/min Arterial Pressure (mmHg): -205.45 mmHg Venous Pressure (mmHg): 190.5 mmHg TMP (mmHg): -14.95 mmHg Ultrafiltration Rate (mL/min): 1100 mL/min Dialysate Flow Rate (mL/min): 50 ml/min  Witnessed with shortness of breath while ambulating in room earlier this morning  Objective:  Vital signs in last 24 hours:  Temp:  [97.6 F (36.4 C)-98.4 F (36.9 C)] 98 F (36.7 C) (12/02 1237) Pulse Rate:  [59-78] 72 (12/02 1300) Resp:  [18-25] 23 (12/02 1300) BP: (113-158)/(59-78) 146/70 (12/02 1300) SpO2:  [96 %-100 %] 100 % (12/02 1300) Weight:  [134.3 kg-137.3 kg] 134.3 kg (12/02 1237)  Weight change: 0 kg Filed Weights   06/21/24 1720 06/22/24 0457 06/22/24 1237  Weight: (!) 137.3 kg 135.5 kg 134.3 kg    Intake/Output: I/O last 3 completed shifts: In: 240 [P.O.:240] Out: 5800 [Urine:1100; Other:4700]   Intake/Output this shift:  No intake/output data recorded.  Physical Exam: General: NAD  Head: Normocephalic, atraumatic. Moist oral mucosal membranes  Eyes: Anicteric   Lungs:  Rales, Fouke O2  Heart: Regular rate and rhythm  Abdomen:  Soft, nontender  Extremities: Trace peripheral edema.  Neurologic: Awake, alert, conversant  Skin: Warm,dry, no rash  Access: Right IJ PermCath    Basic Metabolic Panel: Recent Labs  Lab 06/20/24 1516 06/21/24 0444 06/22/24 0516  NA 141 137 138  K 3.6 3.9 3.8  CL 102 100 99  CO2 27 24 28   GLUCOSE 144* 253* 202*  BUN 44* 47* 34*  CREATININE 3.21* 3.65* 2.98*  CALCIUM 8.9 8.5* 8.5*  MG  --  3.9*  --     Liver Function Tests: Recent Labs  Lab 06/20/24 1516  AST 21  ALT 17  ALKPHOS 95  BILITOT 0.2  PROT 7.0  ALBUMIN 3.0*   No results for input(s): LIPASE, AMYLASE in the last 168 hours. No results for input(s): AMMONIA in the last 168 hours.  CBC: Recent Labs  Lab 06/20/24 1516 06/22/24 1250  WBC 13.2* 12.1*  NEUTROABS 10.5*  --   HGB 9.7* 9.5*  HCT 31.6* 30.1*  MCV 95.5 92.9  PLT 357 353    Cardiac Enzymes: No results for input(s): CKTOTAL, CKMB, CKMBINDEX, TROPONINI in the last 168 hours.  BNP: Invalid input(s): POCBNP  CBG: Recent Labs  Lab 06/22/24 0043 06/22/24 0946 06/22/24 1120  GLUCAP 251* 207* 311*    Microbiology: Results for orders placed or performed during the hospital encounter of 06/20/24  Resp panel by RT-PCR (RSV, Flu A&B, Covid) Anterior Nasal Swab     Status: None   Collection Time: 06/20/24  3:16 PM   Specimen: Anterior Nasal Swab  Result Value Ref Range Status  SARS Coronavirus 2 by RT PCR NEGATIVE NEGATIVE Final    Comment: (NOTE) SARS-CoV-2 target nucleic acids are NOT DETECTED.  The SARS-CoV-2 RNA is generally detectable in upper respiratory specimens during the acute phase of infection. The lowest concentration of SARS-CoV-2 viral copies this assay can detect is 138 copies/mL. A negative result does not preclude SARS-Cov-2 infection and should not be used as the sole basis for treatment or other patient management decisions. A  negative result may occur with  improper specimen collection/handling, submission of specimen other than nasopharyngeal swab, presence of viral mutation(s) within the areas targeted by this assay, and inadequate number of viral copies(<138 copies/mL). A negative result must be combined with clinical observations, patient history, and epidemiological information. The expected result is Negative.  Fact Sheet for Patients:  bloggercourse.com  Fact Sheet for Healthcare Providers:  seriousbroker.it  This test is no t yet approved or cleared by the [place]  FDA and  has been authorized for detection and/or diagnosis of SARS-CoV-2 by FDA under an Emergency Use Authorization (EUA). This EUA will remain  in effect (meaning this test can be used) for the duration of the COVID-19 declaration under Section 564(b)(1) of the Act, 21 U.S.C.section 360bbb-3(b)(1), unless the authorization is terminated  or revoked sooner.       Influenza A by PCR NEGATIVE NEGATIVE Final   Influenza B by PCR NEGATIVE NEGATIVE Final    Comment: (NOTE) The Xpert Xpress SARS-CoV-2/FLU/RSV plus assay is intended as an aid in the diagnosis of influenza from Nasopharyngeal swab specimens and should not be used as a sole basis for treatment. Nasal washings and aspirates are unacceptable for Xpert Xpress SARS-CoV-2/FLU/RSV testing.  Fact Sheet for Patients: bloggercourse.com  Fact Sheet for Healthcare Providers: seriousbroker.it  This test is not yet approved or cleared by the [place]  FDA and has been authorized for detection and/or diagnosis of SARS-CoV-2 by FDA under an Emergency Use Authorization (EUA). This EUA will remain in effect (meaning this test can be used) for the duration of the COVID-19 declaration under Section 564(b)(1) of the Act, 21 U.S.C. section 360bbb-3(b)(1), unless the authorization  is terminated or revoked.     Resp Syncytial Virus by PCR NEGATIVE NEGATIVE Final    Comment: (NOTE) Fact Sheet for Patients: bloggercourse.com  Fact Sheet for Healthcare Providers: seriousbroker.it  This test is not yet approved or cleared by the [place]  FDA and has been authorized for detection and/or diagnosis of SARS-CoV-2 by FDA under an Emergency Use Authorization (EUA). This EUA will remain in effect (meaning this test can be used) for the duration of the COVID-19 declaration under Section 564(b)(1) of the Act, 21 U.S.C. section 360bbb-3(b)(1), unless the authorization is terminated or revoked.  Performed at [facility], [address] [place], [place] [address]     Coagulation Studies: Recent Labs    06/20/24 1516  LABPROT 15.1  INR 1.1    Urinalysis: No results for input(s): COLORURINE, LABSPEC, PHURINE, GLUCOSEU, HGBUR, BILIRUBINUR, KETONESUR, PROTEINUR, UROBILINOGEN, NITRITE, LEUKOCYTESUR in the last 72 hours.  Invalid input(s): APPERANCEUR    Imaging: ECHOCARDIOGRAM COMPLETE Result Date: 06/21/2024    ECHOCARDIOGRAM REPORT   Patient Name:   James Clarke Date of Exam: 06/21/2024 Medical Rec #:  [id number]       Height:       69.0 in Accession #:    [id number]      Weight:       306.4 lb Date of Birth:  [birth year]  BSA:          2.477 m Patient Age:    82 years        BP:           142/71 mmHg Patient Gender: M               HR:           65 bpm. Exam Location:  [facility] Procedure: 2D Echo, Color Doppler and Cardiac Doppler (Both Spectral and Color            Flow Doppler were utilized during procedure). Indications:     CHF--acute systolic I50.21  History:         Patient has prior history of Echocardiogram examinations, most                  recent 05/26/2023. Risk Factors:Dyslipidemia and Diabetes.  Sonographer:     Christopher Furnace Referring Phys:  [id number] MAUDE MARLA DART  Diagnosing Phys: Cara JONETTA Lovelace MD  Sonographer Comments: Technically challenging study due to limited acoustic windows, no apical window, no subcostal window and patient is obese. IMPRESSIONS  1. Technically difficult study.  2. Left ventricular ejection fraction, by estimation, is 35 to 40%. The left ventricle has moderately decreased function. The left ventricle demonstrates global hypokinesis. The left ventricular internal cavity size was moderately dilated. There is moderate concentric left ventricular hypertrophy. Left ventricular diastolic function could not be evaluated.  3. Right ventricular systolic function is normal. The right ventricular size is normal.  4. The mitral valve is normal in structure. No evidence of mitral valve regurgitation.  5. The aortic valve is normal in structure. Aortic valve regurgitation is not visualized. Conclusion(s)/Recommendation(s): Poor windows for evaluation of left ventricular function by transthoracic echocardiography. Would recommend an alternative means of evaluation. FINDINGS  Left Ventricle: Left ventricular ejection fraction, by estimation, is 35 to 40%. The left ventricle has moderately decreased function. The left ventricle demonstrates global hypokinesis. Strain was performed and the global longitudinal strain is indeterminate. The left ventricular internal cavity size was moderately dilated. There is moderate concentric left ventricular hypertrophy. Left ventricular diastolic function could not be evaluated. Right Ventricle: The right ventricular size is normal. No increase in right ventricular wall thickness. Right ventricular systolic function is normal. Left Atrium: Left atrial size was normal in size. Right Atrium: Right atrial size was normal in size. Pericardium: The pericardium was not well visualized. Mitral Valve: The mitral valve is normal in structure. No evidence of mitral valve regurgitation. Tricuspid Valve: The tricuspid valve is normal in  structure. Tricuspid valve regurgitation is not demonstrated. Aortic Valve: The aortic valve is normal in structure. Aortic valve regurgitation is not visualized. Pulmonic Valve: The pulmonic valve was normal in structure. Pulmonic valve regurgitation is not visualized. Aorta: The ascending aorta was not well visualized. IAS/Shunts: No atrial level shunt detected by color flow Doppler. Additional Comments: Technically difficult study. 3D was performed not requiring image post processing on an independent workstation and was indeterminate.  LEFT VENTRICLE PLAX 2D LVIDd:         5.80 cm LVIDs:         4.70 cm LV PW:         1.40 cm LV IVS:        1.60 cm LVOT diam:     2.10 cm LVOT Area:     3.46 cm  LEFT ATRIUM         Index LA diam:  4.20 cm 1.70 cm/m   AORTA Ao Root diam: 3.10 cm  SHUNTS Systemic Diam: 2.10 cm Cara JONETTA Lovelace MD Electronically signed by Cara JONETTA Lovelace MD Signature Date/Time: 06/21/2024/11:25:48 PM    Final    DG Chest 1 View Result Date: 06/20/2024 CLINICAL DATA:  Short of breath. EXAM: CHEST  1 VIEW COMPARISON:  05/27/2023. FINDINGS: Mild enlargement of the cardiac silhouette. No mediastinal or hilar masses. Right internal jugular tunneled dual lumen central venous catheter is stable. Bilateral interstitial thickening is similar to the prior radiograph. No lung consolidation. No pleural effusion or pneumothorax. Skeletal structures are grossly intact. IMPRESSION: 1. No acute cardiopulmonary disease. 2. Mild cardiomegaly and chronic interstitial thickening. Electronically Signed   By: Alm Parkins M.D.   On: 06/20/2024 16:00     Medications:      apixaban  2.5 mg Oral BID   calcitRIOL   0.25 mcg Oral Daily   Chlorhexidine  Gluconate Cloth  6 each Topical Q0600   furosemide   40 mg Intravenous Q12H   insulin  aspart  0-6 Units Subcutaneous TID WC   insulin  glargine-yfgn  35 Units Subcutaneous QHS   isosorbide  mononitrate  30 mg Oral Daily   metoprolol  succinate  50 mg Oral  Daily   midodrine  10 mg Oral Once per day on Monday Wednesday Friday   pravastatin   20 mg Oral q1800   sertraline   50 mg Oral QHS   sodium chloride  flush  3 mL Intravenous Q12H   tamsulosin   0.4 mg Oral Daily   acetaminophen , fluticasone , ipratropium-albuterol , nitroGLYCERIN , ondansetron  (ZOFRAN ) IV, mouth rinse, sodium chloride  flush  Assessment/ Plan:  James Clarke is a 83 y.o.  male with past medical history of CAD, diabetes, asthma, sleep apnea, osteoarthritis, obesity, heart failure and end stage renal disease on hemodialysis. Last treatment received on Saturday. Patient presents to ED with shortness of breath and has been admitted for CHF (congestive heart failure) (HCC) [I50.9] Hypoxia [R09.02] ESRD on dialysis (HCC) [N18.6, Z99.2] Dyspnea, unspecified type [R06.00] Acute on chronic congestive heart failure, unspecified heart failure type (HCC) [I50.9] Acute hypoxic respiratory failure (HCC) [J96.01]  [facility]/MWF/Rt permcath  End stage renal disease on hemodialysis. Patient received treatment yesterday, UF 2L achieved. Agreeable to UF only treatment today, UF goal 2-2.5L as tolerated. Next treatment scheduled for Wednesday per outpatient schedule.   2. Acute respiratory failure, chest x ray shows mild cardiomegaly. History of combined heart failure, ECHO ordered. Room air at baseline, requiring 3L [place]. Did have some fluid removed with dialysis yesterday, Will perform additional treatment today for fluid removal.   3. Anemia of chronic kidney disease Lab Results  Component Value Date   HGB 9.5 (L) 06/22/2024    Hgb 9.5, acceptable  4. Secondary Hyperparathyroidism: with outpatient labs: None available Lab Results  Component Value Date   CALCIUM 8.5 (L) 06/22/2024   CAION 1.19 06/03/2024   PHOS 4.4 12/13/2022    Calcium stable. Continue calcitriol  daily.    LOS: 2 James Clarke 12/2/20251:49 PM

## 2024-06-22 NOTE — TOC Progression Note (Signed)
 Transition of Care [facility]) - Progression Note    Patient Details  Name: James Clarke MRN: [id number] Date of Birth: [birth year]  [facility]) CM/SW Contact  Lauraine JAYSON Carpen, LCSW Phone Number: 06/22/2024, 12:11 PM  Clinical Narrative:  [name] has not been active with patient since last year but they can take him for PT and OT. Patient and wife are aware.   Expected Discharge Plan: Home w Home Health Services                 Expected Discharge Plan and Services       Living arrangements for the past 2 months: Single Family Home                                       Social Drivers of Health (SDOH) Interventions SDOH Screenings   Food Insecurity: No Food Insecurity (06/20/2024)  Housing: Low Risk  (06/20/2024)  Transportation Needs: No Transportation Needs (06/20/2024)  Utilities: Not At Risk (06/20/2024)  Depression (PHQ2-9): Low Risk  (10/30/2021)  Financial Resource Strain: Low Risk  (11/06/2023)   [facility]  Tobacco Use: Low Risk  (06/20/2024)    Readmission Risk Interventions    05/28/2023   12:24 PM 02/27/2022    4:22 PM  Readmission Risk Prevention Plan  Transportation Screening Complete Complete  PCP or Specialist Appt within 3-5 Days Complete   HRI or Home Care Consult  Complete  Social Work Consult for Recovery Care Planning/Counseling Complete   Palliative Care Screening Not Applicable Not Applicable  Medication Review Oceanographer) Complete Complete

## 2024-06-23 DIAGNOSIS — I509 Heart failure, unspecified: Secondary | ICD-10-CM

## 2024-06-23 DIAGNOSIS — J9621 Acute and chronic respiratory failure with hypoxia: Secondary | ICD-10-CM

## 2024-06-23 DIAGNOSIS — I5043 Acute on chronic combined systolic (congestive) and diastolic (congestive) heart failure: Secondary | ICD-10-CM | POA: Diagnosis not present

## 2024-06-23 LAB — GLUCOSE, CAPILLARY
Glucose-Capillary: 160 mg/dL — ABNORMAL HIGH (ref 70–99)
Glucose-Capillary: 315 mg/dL — ABNORMAL HIGH (ref 70–99)
Glucose-Capillary: 333 mg/dL — ABNORMAL HIGH (ref 70–99)
Glucose-Capillary: 244 mg/dL — ABNORMAL HIGH (ref 70–99)

## 2024-06-23 LAB — BASIC METABOLIC PANEL WITH GFR
Anion gap: 13 (ref 5–15)
BUN: 52 mg/dL — ABNORMAL HIGH (ref 8–23)
CO2: 25 mmol/L (ref 22–32)
Calcium: 8.8 mg/dL — ABNORMAL LOW (ref 8.9–10.3)
Chloride: 98 mmol/L (ref 98–111)
Creatinine, Ser: 3.66 mg/dL — ABNORMAL HIGH (ref 0.61–1.24)
GFR, Estimated: 16 mL/min — ABNORMAL LOW (ref >60)
Glucose, Bld: 173 mg/dL — ABNORMAL HIGH (ref 70–99)
Potassium: 3.4 mmol/L — ABNORMAL LOW (ref 3.5–5.1)
Sodium: 136 mmol/L (ref 135–145)

## 2024-06-23 LAB — TROPONIN T, HIGH SENSITIVITY
Troponin T High Sensitivity: 117 ng/L (ref 0–19)
Troponin T High Sensitivity: 122 ng/L (ref 0–19)

## 2024-06-23 MED ORDER — POTASSIUM CHLORIDE CRYS ER 20 MEQ PO TBCR
40.0000 meq | EXTENDED_RELEASE_TABLET | Freq: Once | ORAL | Status: AC
  Administered 2024-06-23: 40 meq via ORAL
  Filled 2024-06-23: qty 2

## 2024-06-23 MED ORDER — HEPARIN SODIUM (PORCINE) 1000 UNIT/ML IJ SOLN
INTRAMUSCULAR | Status: AC
  Filled 2024-06-23: qty 4

## 2024-06-23 MED ORDER — MIDODRINE HCL 5 MG PO TABS
ORAL_TABLET | ORAL | Status: AC
  Filled 2024-06-23: qty 2

## 2024-06-23 MED ORDER — INSULIN ASPART 100 UNIT/ML IJ SOLN
4.0000 [IU] | Freq: Once | INTRAMUSCULAR | Status: AC
  Administered 2024-06-23: 4 [IU] via SUBCUTANEOUS

## 2024-06-23 NOTE — Consult Note (Addendum)
 [facility] CLINIC CARDIOLOGY CONSULT NOTE       Patient ID: James Clarke MRN: [id number] DOB/AGE: 84-02-1941 84 y.o.  Admit date: 06/20/2024 Referring Physician Dr. Aida Clarke Primary Physician Clarke, James BRAVO, MD  Primary Cardiologist Dr. Wilburn Reason for Consultation chest pain  HPI: James Clarke is a 83 y.o. male  with a past medical history of chronic HFrEF, aortic atherosclerosis, moderate nonobstructive CAD 12/2021, hypertension, sleep apnea on BiPAP, type 2 diabetes, hyperlipidemia, ESRD on HD (M,W,F)  who presented to the ED on 06/20/2024 for SOB. Today during dialysis developed CP and SOB which resolved after 1 dose of NTG. Cardiology was consulted for further evaluation.   Patient initially presented to the ED with complaints of worsening shortness of breath.  Labs today notable for creatinine 3.66, potassium 3.4, hemoglobin 9.5, WBC 12.1. On admission troponins 140 > 132, BNP 23,719. EKG in the ED NSR PVCs rate 79 bpm. CXR without acute abnormality.  Today during dialysis developed chest pain/shortness of breath.  At the time my evaluation cephradine patient is resting comfortably in hospital bed with wife at bedside.  We discussed his symptoms in further detail.  He states that today he had bad dialysis treatment during which he developed shortness of breath and chest tightness.  States that he is overall feeling better after he received a dose of nitroglycerin  and a breathing treatment.  This subsequently dropped his blood pressure so he is feeling fatigued at this time.  Denies any palpitations today.  Also denies nausea.  States that he does feel some dizziness/lightheadedness.  He reports that last week on Friday he had a dialysis treatment scheduled but this was cut short due to machine issue and his blood pressure medications have been altered over the last few days because of this.  Review of systems complete and found to be negative unless listed above    Past  Medical History:  Diagnosis Date   (HFpEF) heart failure with preserved ejection fraction (HCC)    a.) TTE 11/26/2021: EF 45%, mod LVH, post HK, mod LAE, mild MR/TR, G2DD; b.) TTE 02/26/2022: EF 55-60%, BAE, triv MR, G2DD; c.) TTE 07/09/2022: EF 45%, post HK, LVH, mod LAE, triv TR/PR, mild MR, G1DD   Anemia of chronic renal failure    Aortic atherosclerosis    Asthma    Bell's palsy    BPH (benign prostatic hyperplasia)    CAD (coronary artery disease)    a.) LHC 12/31/2021: 60% mLAD, 20% pRCA, 20% dRCA, 20% o-pLCx --> med mgmt.   CKD (chronic kidney disease), stage IV (HCC)    Diabetic neuropathy (HCC)    Dyspnea on exertion    Ganglion cyst of wrist, right    a.) s/p excision 04/2011   Hepatic steatosis    History of 2019 novel coronavirus disease (COVID-19)    History of bilateral cataract extraction 2018   Hyperlipidemia    Hyperparathyroidism due to renal insufficiency    Hypertension    Low testosterone in male    Nephrolithiasis    Obesity    OSA treated with BiPAP    Osteoarthritis    Peripheral edema    Right inguinal hernia    a.) s/p repair   Skin cancer    Type 2 diabetes mellitus with renal manifestations [facility])     Past Surgical History:  Procedure Laterality Date   A/V FISTULAGRAM Left 01/13/2023   Procedure: A/V Fistulagram;  Surgeon: Marea Selinda RAMAN, MD;  Location: [facility] INVASIVE CV  LAB;  Service: Cardiovascular;  Laterality: Left;   A/V SHUNT INTERVENTION Left 04/11/2023   Procedure: A/V SHUNT INTERVENTION;  Surgeon: Marea Selinda RAMAN, MD;  Location: [facility] INVASIVE CV LAB;  Service: Cardiovascular;  Laterality: Left;   APPENDECTOMY     AV FISTULA PLACEMENT Left 09/19/2022   Procedure: ARTERIOVENOUS (AV) FISTULA CREATION (RADIO CEPHALIC);  Surgeon: Marea Selinda RAMAN, MD;  Location: [facility] ORS;  Service: Vascular;  Laterality: Left;   CATARACT EXTRACTION W/PHACO Left 05/20/2017   Procedure: CATARACT EXTRACTION PHACO AND INTRAOCULAR LENS PLACEMENT (IOC);  Surgeon: Jaye Fallow, MD;  Location: [facility] ORS;  Service: Ophthalmology;  Laterality: Left;  US  00:32.0 AP% 15.5 CDE 4.97 Fluid Pack Lot # [id number]   CATARACT EXTRACTION W/PHACO Right 06/10/2017   Procedure: CATARACT EXTRACTION PHACO AND INTRAOCULAR LENS PLACEMENT (IOC);  Surgeon: Jaye Fallow, MD;  Location: [facility] ORS;  Service: Ophthalmology;  Laterality: Right;  US   00:51 AP% 15.4 CDE 7.95 Fluid pack lot # [id number]   CHOLECYSTECTOMY     COLONOSCOPY     x3   CYSTOSCOPY W/ RETROGRADES Right 08/13/2019   Procedure: CYSTOSCOPY WITH RETROGRADE PYELOGRAM;  Surgeon: Francisca Redell BROCKS, MD;  Location: [facility] ORS;  Service: Urology;  Laterality: Right;   CYSTOSCOPY WITH STENT PLACEMENT Right 07/26/2019   Procedure: CYSTOSCOPY WITH STENT PLACEMENT;  Surgeon: Carolee Sherwood JONETTA DOUGLAS, MD;  Location: [facility] ORS;  Service: Urology;  Laterality: Right;   CYSTOSCOPY/URETEROSCOPY/HOLMIUM LASER/STENT PLACEMENT Right 08/13/2019   Procedure: CYSTOSCOPY/URETEROSCOPY/HOLMIUM LASER/STENT EXCHANGE;  Surgeon: Francisca Redell BROCKS, MD;  Location: [facility] ORS;  Service: Urology;  Laterality: Right;   CYSTOSCOPY/URETEROSCOPY/HOLMIUM LASER/STENT PLACEMENT Left 02/27/2022   Procedure: CYSTOSCOPY/URETEROSCOPY/HOLMIUM LASER/STENT PLACEMENT;  Surgeon: Twylla Glendia BROCKS, MD;  Location: [facility] ORS;  Service: Urology;  Laterality: Left;   DIALYSIS/PERMA CATHETER INSERTION N/A 04/11/2023   Procedure: DIALYSIS/PERMA CATHETER INSERTION;  Surgeon: Marea Selinda RAMAN, MD;  Location: [facility] INVASIVE CV LAB;  Service: Cardiovascular;  Laterality: N/A;   DIALYSIS/PERMA CATHETER INSERTION N/A 06/03/2024   Procedure: DIALYSIS/PERMA CATHETER INSERTION;  Surgeon: Marea Selinda RAMAN, MD;  Location: [facility] INVASIVE CV LAB;  Service: Cardiovascular;  Laterality: N/A;   GANGLION CYST EXCISION Right    INGUINAL HERNIA REPAIR Right    LEFT HEART CATH AND CORONARY ANGIOGRAPHY N/A 12/31/2021   Procedure: LEFT HEART CATH AND CORONARY ANGIOGRAPHY;  Surgeon: Hester Wolm PARAS, MD;  Location:  [facility] INVASIVE CV LAB;  Service: Cardiovascular;  Laterality: N/A;   SHOULDER ARTHROSCOPY WITH SUBACROMIAL DECOMPRESSION AND OPEN ROTATOR C Right 09/11/2020   Procedure: Right shoulder arthroscopic rotator cuff repair vs Regeneten patch application, subacromial decompression, and biceps tenodesis - Krystal Doyne to Assist;  Surgeon: Tobie Priest, MD;  Location: [facility] ORS;  Service: Orthopedics;  Laterality: Right;   TEMPORARY DIALYSIS CATHETER N/A 03/05/2022   Procedure: TEMPORARY DIALYSIS CATHETER;  Surgeon: Jama Cordella MATSU, MD;  Location: [facility] INVASIVE CV LAB;  Service: Cardiovascular;  Laterality: N/A;   TONSILLECTOMY     URETEROSCOPY WITH HOLMIUM LASER LITHOTRIPSY Left 02/27/2022   Procedure: URETEROSCOPY WITH HOLMIUM LASER LITHOTRIPSY;  Surgeon: Twylla Glendia BROCKS, MD;  Location: [facility] ORS;  Service: Urology;  Laterality: Left;   WRIST FRACTURE SURGERY Right     Medications Prior to Admission  Medication Sig Dispense Refill Last Dose/Taking   acetaminophen  (TYLENOL ) 500 MG tablet Take 1,000 mg by mouth every 6 (six) hours as needed (shoulder pain).   Unknown   albuterol  (PROVENTIL  HFA;VENTOLIN  HFA) 108 (90 Base) MCG/ACT inhaler Inhale 2 puffs into the lungs every 6 (six) hours as  needed for wheezing or shortness of breath.   Unknown   apixaban (ELIQUIS) 2.5 MG TABS tablet Take 2.5 mg by mouth.   06/20/2024   calcitRIOL  (ROCALTROL ) 0.25 MCG capsule Take 0.25 mcg by mouth daily.   Past Week   cetirizine (ZYRTEC) 10 MG chewable tablet Chew 10 mg by mouth daily as needed for allergies.   Unknown   diazepam  (VALIUM ) 2 MG tablet Take 2 mg by mouth every 12 (twelve) hours as needed.   Unknown   fluticasone  (FLONASE ) 50 MCG/ACT nasal spray as needed.   Unknown   furosemide  (LASIX ) 40 MG tablet Take 2 tablets (80 mg total) by mouth 2 (two) times daily. On non dialysis days, Sunday, Tuesday, Thursday, Saturday and Sunday 120 tablet 0 06/20/2024 Morning   insulin  aspart (NOVOLOG ) 100 UNIT/ML injection  Inject 14 Units into the skin 3 (three) times daily with meals. (Patient taking differently: Inject 25-35 Units into the skin 3 (three) times daily with meals. Inject 35 units subcutaneously daily before breakfast, 25 units before lunch and 35 units before supper) 10 mL 11 06/20/2024 Morning   Insulin  Degludec (TRESIBA) 100 UNIT/ML SOLN Inject 35 Units into the skin at bedtime.   06/19/2024   isosorbide  mononitrate (IMDUR ) 30 MG 24 hr tablet Take 1 tablet (30 mg total) by mouth daily. 30 tablet 2 06/20/2024 Morning   lovastatin (MEVACOR) 40 MG tablet Take 40 mg by mouth daily with supper.   06/19/2024   metoprolol  succinate (TOPROL -XL) 50 MG 24 hr tablet Take 50 mg by mouth in the morning and at bedtime. Take with or immediately following a meal.   06/20/2024   midodrine (PROAMATINE) 10 MG tablet Take 10 mg by mouth 3 (three) times a week. On dialysis days and PRN on non-dialysis days   06/19/2024   mupirocin  ointment (BACTROBAN ) 2 % Apply 1 Application topically 2 (two) times daily. 15 g 0 Unknown   nitroGLYCERIN  (NITROSTAT ) 0.4 MG SL tablet Place 1 tablet (0.4 mg total) under the tongue every 5 (five) minutes as needed for chest pain. 20 tablet 1 Unknown   sertraline  (ZOLOFT ) 50 MG tablet Take 50 mg by mouth at bedtime.   06/19/2024   tamsulosin  (FLOMAX ) 0.4 MG CAPS capsule TAKE (1) CAPSULE BY MOUTH EVERY DAY 30 capsule 11 06/20/2024 Morning   Continuous Blood Gluc Sensor (FREESTYLE LIBRE 14 DAY SENSOR) MISC by Does not apply route.      GLOBAL INJECT EASE INSULIN  SYR 31G X 5/16 0.5 ML MISC Inject into the skin.      glucose blood (ONETOUCH ULTRA) test strip TEST BLOOD SUGAR 4 TIMES DAILY      Lancets (ONETOUCH DELICA PLUS LANCET30G) MISC USE 1 LANCET 4 TIMES DAILY      Social History   Socioeconomic History   Marital status: Married    Spouse name: Not on file   Number of children: Not on file   Years of education: Not on file   Highest education level: Not on file  Occupational History    Not on file  Tobacco Use   Smoking status: Never    Passive exposure: Never   Smokeless tobacco: Never  Vaping Use   Vaping status: Never Used  Substance and Sexual Activity   Alcohol use: No   Drug use: Not Currently   Sexual activity: Yes    Birth control/protection: None  Other Topics Concern   Not on file  Social History Narrative   Not on file   Social Drivers  of Health   Financial Resource Strain: Low Risk  (11/06/2023)   Received [facility]   Overall Financial Resource Strain (CARDIA)    Difficulty of Paying Living Expenses: Not hard at all  Food Insecurity: No Food Insecurity (06/20/2024)   Hunger Vital Sign    Worried About Running Out of Food in the Last Year: Never true    Ran Out of Food in the Last Year: Never true  Transportation Needs: No Transportation Needs (06/20/2024)   PRAPARE - Administrator, Civil Service (Medical): No    Lack of Transportation (Non-Medical): No  Physical Activity: Not on file  Stress: Not on file  Social Connections: Not on file  Intimate Partner Violence: Not At Risk (06/20/2024)   Humiliation, Afraid, Rape, and Kick questionnaire    Fear of Current or Ex-Partner: No    Emotionally Abused: No    Physically Abused: No    Sexually Abused: No    Family History  Problem Relation Age of Onset   Emphysema Mother    COPD Mother    Heart disease Mother    Brain cancer Father      Vitals:   06/23/24 1310 06/23/24 1329 06/23/24 1330 06/23/24 1356  BP: 117/83 (!) 82/54 94/65   Pulse: 76  78   Resp: (!) 21     Temp:      TempSrc:      SpO2: 98%  97% 97%  Weight:      Height:        PHYSICAL EXAM General: Chronically ill-appearing elderly male, well nourished, in no acute distress. HEENT: Normocephalic and atraumatic. Neck: No JVD.  Lungs: Normal respiratory effort on 3L [place].  Diminished bilaterally. Heart: HRRR. Normal S1 and S2 without gallops or murmurs.  Abdomen: Non-distended  appearing.  Msk: Normal strength and tone for age. Extremities: Warm and well perfused. No clubbing, cyanosis.  Trace edema.  Neuro: Alert and oriented X 3. Psych: Answers questions appropriately.   Labs: Basic Metabolic Panel: Recent Labs    06/21/24 0444 06/22/24 0516 06/23/24 0453  NA 137 138 136  K 3.9 3.8 3.4*  CL 100 99 98  CO2 24 28 25   GLUCOSE 253* 202* 173*  BUN 47* 34* 52*  CREATININE 3.65* 2.98* 3.66*  CALCIUM 8.5* 8.5* 8.8*  MG 3.9*  --   --    Liver Function Tests: No results for input(s): AST, ALT, ALKPHOS, BILITOT, PROT, ALBUMIN in the last 72 hours. No results for input(s): LIPASE, AMYLASE in the last 72 hours. CBC: Recent Labs    06/22/24 1250  WBC 12.1*  HGB 9.5*  HCT 30.1*  MCV 92.9  PLT 353   Cardiac Enzymes: No results for input(s): CKTOTAL, CKMB, CKMBINDEX, TROPONINIHS in the last 72 hours. BNP: No results for input(s): BNP in the last 72 hours. D-Dimer: No results for input(s): DDIMER in the last 72 hours. Hemoglobin A1C: No results for input(s): HGBA1C in the last 72 hours. Fasting Lipid Panel: No results for input(s): CHOL, HDL, LDLCALC, TRIG, CHOLHDL, LDLDIRECT in the last 72 hours. Thyroid  Function Tests: No results for input(s): TSH, T4TOTAL, T3FREE, THYROIDAB in the last 72 hours.  Invalid input(s): FREET3 Anemia Panel: No results for input(s): VITAMINB12, FOLATE, FERRITIN, TIBC, IRON, RETICCTPCT in the last 72 hours.   Radiology: ECHOCARDIOGRAM COMPLETE Result Date: 06/21/2024    ECHOCARDIOGRAM REPORT   Patient Name:   MENACHEM GENE Palomarez Date of Exam: 06/21/2024 Medical Rec #:  [id number]  Height:       69.0 in Accession #:    [id number]      Weight:       306.4 lb Date of Birth:  1940-10-16      BSA:          2.477 m Patient Age:    82 years        BP:           142/71 mmHg Patient Gender: M               HR:           65 bpm. Exam Location:  [facility] Procedure: 2D Echo,  Color Doppler and Cardiac Doppler (Both Spectral and Color            Flow Doppler were utilized during procedure). Indications:     CHF--acute systolic I50.21  History:         Patient has prior history of Echocardiogram examinations, most                  recent 05/26/2023. Risk Factors:Dyslipidemia and Diabetes.  Sonographer:     Christopher Furnace Referring Phys:  [id number] MAUDE MARLA DART Diagnosing Phys: Cara JONETTA Lovelace MD  Sonographer Comments: Technically challenging study due to limited acoustic windows, no apical window, no subcostal window and patient is obese. IMPRESSIONS  1. Technically difficult study.  2. Left ventricular ejection fraction, by estimation, is 35 to 40%. The left ventricle has moderately decreased function. The left ventricle demonstrates global hypokinesis. The left ventricular internal cavity size was moderately dilated. There is moderate concentric left ventricular hypertrophy. Left ventricular diastolic function could not be evaluated.  3. Right ventricular systolic function is normal. The right ventricular size is normal.  4. The mitral valve is normal in structure. No evidence of mitral valve regurgitation.  5. The aortic valve is normal in structure. Aortic valve regurgitation is not visualized. Conclusion(s)/Recommendation(s): Poor windows for evaluation of left ventricular function by transthoracic echocardiography. Would recommend an alternative means of evaluation. FINDINGS  Left Ventricle: Left ventricular ejection fraction, by estimation, is 35 to 40%. The left ventricle has moderately decreased function. The left ventricle demonstrates global hypokinesis. Strain was performed and the global longitudinal strain is indeterminate. The left ventricular internal cavity size was moderately dilated. There is moderate concentric left ventricular hypertrophy. Left ventricular diastolic function could not be evaluated. Right Ventricle: The right ventricular size is normal. No increase in  right ventricular wall thickness. Right ventricular systolic function is normal. Left Atrium: Left atrial size was normal in size. Right Atrium: Right atrial size was normal in size. Pericardium: The pericardium was not well visualized. Mitral Valve: The mitral valve is normal in structure. No evidence of mitral valve regurgitation. Tricuspid Valve: The tricuspid valve is normal in structure. Tricuspid valve regurgitation is not demonstrated. Aortic Valve: The aortic valve is normal in structure. Aortic valve regurgitation is not visualized. Pulmonic Valve: The pulmonic valve was normal in structure. Pulmonic valve regurgitation is not visualized. Aorta: The ascending aorta was not well visualized. IAS/Shunts: No atrial level shunt detected by color flow Doppler. Additional Comments: Technically difficult study. 3D was performed not requiring image post processing on an independent workstation and was indeterminate.  LEFT VENTRICLE PLAX 2D LVIDd:         5.80 cm LVIDs:         4.70 cm LV PW:         1.40 cm LV IVS:  1.60 cm LVOT diam:     2.10 cm LVOT Area:     3.46 cm  LEFT ATRIUM         Index LA diam:    4.20 cm 1.70 cm/m   AORTA Ao Root diam: 3.10 cm  SHUNTS Systemic Diam: 2.10 cm Cara JONETTA Lovelace MD Electronically signed by Cara JONETTA Lovelace MD Signature Date/Time: 06/21/2024/11:25:48 PM    Final    DG Chest 1 View Result Date: 06/20/2024 CLINICAL DATA:  Short of breath. EXAM: CHEST  1 VIEW COMPARISON:  05/27/2023. FINDINGS: Mild enlargement of the cardiac silhouette. No mediastinal or hilar masses. Right internal jugular tunneled dual lumen central venous catheter is stable. Bilateral interstitial thickening is similar to the prior radiograph. No lung consolidation. No pleural effusion or pneumothorax. Skeletal structures are grossly intact. IMPRESSION: 1. No acute cardiopulmonary disease. 2. Mild cardiomegaly and chronic interstitial thickening. Electronically Signed   By: Alm Parkins M.D.   On:  06/20/2024 16:00   PERIPHERAL VASCULAR CATHETERIZATION Result Date: 06/03/2024 See surgical note for result.   ECHO 06/21/2024: 1. Technically difficult study.   2. Left ventricular ejection fraction, by estimation, is 35 to 40%. The left ventricle has moderately decreased function. The left ventricle demonstrates global hypokinesis. The left ventricular internal cavity size  was moderately dilated. There is moderate concentric left ventriular hypertrophy. Left ventricular diastolic function could not be evaluated.   3. Right ventricular systolic function is normal. The right ventricular  size is normal.   4. The mitral valve is normal in structure. No evidence of mitral valve  regurgitation.   5. The aortic valve is normal in structure. Aortic valve regurgitation is  not visualized.   TELEMETRY (personally reviewed): sinus rhythm rate 70s  EKG (personally reviewed): NSR PVCs rate 79 bpm  Data reviewed by me 06/23/2024: last 24h vitals tele labs imaging I/O ED provider note, admission H&P  Principal Problem:   CHF (congestive heart failure) (HCC) Active Problems:   Acute hypoxic respiratory failure (HCC)    ASSESSMENT AND PLAN:  James Clarke is a 83 y.o. male  with a past medical history of chronic HFrEF, aortic atherosclerosis, moderate nonobstructive CAD 12/2021, hypertension, sleep apnea on BiPAP, type 2 diabetes, hyperlipidemia, ESRD on HD (M,W,F)  who presented to the ED on 06/20/2024 for SOB. Today during dialysis developed CP and SOB which resolved after 1 dose of NTG. Cardiology was consulted for further evaluation.   # Chest pain # Acute on chronic HFrEF # ESRD on HD Patient with an episode of CP, SOB during HD today. Resolved with one dose of nitroglycerin  and a breathing treatment.  Did have drop in BP after nitro administered.  Overall feeling improved this afternoon. -Continue Eliquis 2.5 mg twice daily. -Previously taking metoprolol  succinate 50 mg twice daily on  nondialysis days and 50 mg p.m. on dialysis days, currently has metoprolol  succinate 50 mg daily ordered as well as Imdur  30 mg.  Previously has not been able to tolerate other GDMT due to dizziness. -Would not recommend any invasive evaluation at this time, patient has known history of coronary disease and no other episodes of chest discomfort recently.   This patient's plan of care was discussed and created with Dr. Lovelace and he is in agreement.  Signed: Danita Bloch, PA-C  06/23/2024, 3:33 PM [facility]

## 2024-06-23 NOTE — Progress Notes (Signed)
   06/23/24 1212  Vitals  BP (!) 122/55  MAP (mmHg) 68  BP Location Left Arm  BP Method Automatic  Patient Position (if appropriate) Lying  Pulse Rate 70  Pulse Rate Source Monitor  ECG Heart Rate 76  Resp (!) 26  Oxygen Therapy  SpO2 92 %  During Treatment Monitoring  Blood Flow Rate (mL/min) 349 mL/min  Arterial Pressure (mmHg) -208.27 mmHg  Venous Pressure (mmHg) 170.7 mmHg  TMP (mmHg) -5.66 mmHg  Ultrafiltration Rate (mL/min) 741 mL/min  Dialysate Flow Rate (mL/min) 299 ml/min  Dialysate Potassium Concentration 3  Dialysate Calcium Concentration 2.5  Duration of HD Treatment -hour(s) 3.5 hour(s)  Cumulative Fluid Removed (mL) per Treatment  2000.11  Post Treatment  Dialyzer Clearance Clear  Hemodialysis Intake (mL) 0 mL  Liters Processed 73.5  Fluid Removed (mL) 2000 mL  Tolerated HD Treatment Yes  Post-Hemodialysis Comments Tx has been tolerated. Set goal has been achieved. Vs have remained stable. Access is clean, dry and intact. Patient has no verbalized concerns post tx. Stable to return to his assigned room. Report given

## 2024-06-23 NOTE — Progress Notes (Signed)
 PT'S CPAP MACHINE CHECKED BY RT.

## 2024-06-23 NOTE — Care Management Important Message (Signed)
 Important Message  Patient Details  Name: James Clarke MRN: [id number] Date of Birth: 09-30-1940   Important Message Given:  Yes - Medicare IM     Marquail Bradwell W, CMA 06/23/2024, 12:44 PM

## 2024-06-23 NOTE — Progress Notes (Signed)
 Physical Therapy Treatment Patient Details Name: James Clarke MRN: [id number] DOB: 1941/02/06 Today's Date: 06/23/2024   History of Present Illness Pt is an 83 y/o M admitted on 06/20/24 after presenting with c/o increased SOB. Pt is being treated for acute hypoxemic respiratory failure. PMH: obesity, ESRD on HD MWF, HFpEF, DM, HLD, asthma, Bell's palsy, CAD, neuropathy, OSA on BiPAP, OA    PT Comments  Pt seen for PT tx with pt agreeable, wife present in room. PT educates wife & pt on importance of acute therapy & OOB mobility. Pt is able to progress to ambulating short distance with RW & CGA. Pt reports feeling good sitting in recliner. Recommend ongoing PT services to progress mobility as able.    If plan is discharge home, recommend the following: A little help with walking and/or transfers;A little help with bathing/dressing/bathroom;Assistance with cooking/housework;Assist for transportation;Help with stairs or ramp for entrance   Can travel by private vehicle        Equipment Recommendations  None recommended by PT    Recommendations for Other Services       Precautions / Restrictions Precautions Precautions: Fall Restrictions Weight Bearing Restrictions Per Provider Order: No     Mobility  Bed Mobility Overal bed mobility: Modified Independent Bed Mobility: Supine to Sit     Supine to sit: HOB elevated, Used rails, Modified independent (Device/Increase time) (exit L side of bed)          Transfers Overall transfer level: Needs assistance Equipment used: Rolling walker (2 wheels) Transfers: Sit to/from Stand, Bed to chair/wheelchair/BSC Sit to Stand: Min assist   Step pivot transfers: Contact guard assist (bed>recliner on R with RW)       General transfer comment: cuing re: hand placement to push to standing, reach back    Ambulation/Gait Ambulation/Gait assistance: Contact guard assist Gait Distance (Feet):  (3 ft forwards & backwards x 2 with  RW) Assistive device: Rolling walker (2 wheels) Gait Pattern/deviations: Decreased step length - right, Decreased step length - left, Decreased stride length Gait velocity: decreased         Stairs             Wheelchair Mobility     Tilt Bed    Modified Rankin (Stroke Patients Only)       Balance Overall balance assessment: Needs assistance Sitting-balance support: Feet supported Sitting balance-Leahy Scale: Good     Standing balance support: Bilateral upper extremity supported, During functional activity, Reliant on assistive device for balance Standing balance-Leahy Scale: Fair                              Hotel Manager: No apparent difficulties  Cognition Arousal: Alert Behavior During Therapy: WFL for tasks assessed/performed   PT - Cognitive impairments: No apparent impairments                         Following commands: Intact      Cueing Cueing Techniques: Verbal cues  Exercises      General Comments General comments (skin integrity, edema, etc.): Pt on 3L/min via nasal cannula, lowest SPO2 88% after gait, able to recover to >/= 90% with rest & pursed lip breathing. Educated pt on importance of OOB mobility with pt receptive.      Pertinent Vitals/Pain Pain Assessment Pain Assessment: Faces Faces Pain Scale: Hurts even more Pain Location: chest, something sitting on his  chest but then states it's more of difficulty breathing vs tightness Pain Descriptors / Indicators: Discomfort Pain Intervention(s): Monitored during session, Limited activity within patient's tolerance (nurse aware - cleared pt for PT)    Home Living                          Prior Function            PT Goals (current goals can now be found in the care plan section) Acute Rehab PT Goals Patient Stated Goal: improved breathing PT Goal Formulation: With patient/family Time For Goal Achievement:  07/05/24 Potential to Achieve Goals: Good Progress towards PT goals: Progressing toward goals    Frequency    Min 2X/week      PT Plan      Co-evaluation              AM-PAC PT 6 Clicks Mobility   Outcome Measure  Help needed turning from your back to your side while in a flat bed without using bedrails?: None Help needed moving from lying on your back to sitting on the side of a flat bed without using bedrails?: A Little Help needed moving to and from a bed to a chair (including a wheelchair)?: A Little Help needed standing up from a chair using your arms (e.g., wheelchair or bedside chair)?: A Little Help needed to walk in hospital room?: A Little Help needed climbing 3-5 steps with a railing? : A Little 6 Click Score: 19    End of Session Equipment Utilized During Treatment: Oxygen Activity Tolerance: Patient tolerated treatment well Patient left: in chair;with chair alarm set;with call bell/phone within reach;with family/visitor present Nurse Communication: Mobility status PT Visit Diagnosis: Difficulty in walking, not elsewhere classified (R26.2);Other abnormalities of gait and mobility (R26.89);Muscle weakness (generalized) (M62.81)     Time: 8491-8467 PT Time Calculation (min) (ACUTE ONLY): 24 min  Charges:    $Therapeutic Activity: 23-37 mins PT General Charges $$ ACUTE PT VISIT: 1 Visit                     Richerd Pinal, PT, DPT 06/23/24, 4:10 PM   [name] 06/23/2024, 4:10 PM

## 2024-06-23 NOTE — Progress Notes (Signed)
 Patient off the floor to dialysis.

## 2024-06-23 NOTE — Progress Notes (Signed)
 Occupational Therapy Treatment Patient Details Name: James Clarke MRN: [id number] DOB: 1940/08/08 Today's Date: 06/23/2024   History of present illness Pt is an 83 y/o M admitted on 06/20/24 after presenting with c/o increased SOB. Pt is being treated for acute hypoxemic respiratory failure. PMH: obesity, ESRD on HD MWF, HFpEF, DM, HLD, asthma, Bell's palsy, CAD, neuropathy, OSA on BiPAP, OA   OT comments  Mr Dais was seen for OT treatment on this date. Upon arrival to room pt seated in chair, agreeable to tx. Pt requires MIN A + RW sit<>stand, tolerates ~1 min prior to sitting citing dizziness/fatigue. SpO2 95% on 3L , 92% on Ra at rest, desat 88% on RA standing, resolved to 94% on 1L . RN notified. Educated pt on ECS. Pt making good progress toward goals, will continue to follow POC. Discharge recommendation remains appropriate.        If plan is discharge home, recommend the following:  A little help with walking and/or transfers;A lot of help with bathing/dressing/bathroom;Help with stairs or ramp for entrance;Assistance with cooking/housework   Equipment Recommendations  None recommended by OT    Recommendations for Other Services      Precautions / Restrictions Precautions Precautions: Fall Restrictions Weight Bearing Restrictions Per Provider Order: No       Mobility Bed Mobility               General bed mobility comments: not tested    Transfers Overall transfer level: Needs assistance Equipment used: Rolling walker (2 wheels) Transfers: Sit to/from Stand Sit to Stand: Min assist                 Balance Overall balance assessment: Needs assistance Sitting-balance support: Feet supported Sitting balance-Leahy Scale: Good     Standing balance support: Bilateral upper extremity supported Standing balance-Leahy Scale: Fair                             ADL either performed or assessed with clinical judgement   ADL Overall ADL's :  Needs assistance/impaired                                       General ADL Comments: MIN A + RW for simulated BSC t/f.     Communication Communication Communication: No apparent difficulties   Cognition Arousal: Alert Behavior During Therapy: WFL for tasks assessed/performed Cognition: No apparent impairments                               Following commands: Intact        Cueing      Exercises      Shoulder Instructions       General Comments      Pertinent Vitals/ Pain       Pain Assessment Pain Assessment: No/denies pain   Frequency  Min 2X/week        Progress Toward Goals  OT Goals(current goals can now be found in the care plan section)  Progress towards OT goals: Progressing toward goals  Acute Rehab OT Goals OT Goal Formulation: With patient/family Time For Goal Achievement: 07/05/24 Potential to Achieve Goals: Fair ADL Goals Pt Will Perform Grooming: with supervision;standing Pt Will Perform Upper Body Dressing: with supervision;sitting Pt Will Transfer to Toilet: with supervision;ambulating;grab bars;regular height  toilet  Plan      Co-evaluation                 AM-PAC OT 6 Clicks Daily Activity     Outcome Measure   Help from another person eating meals?: None Help from another person taking care of personal grooming?: None Help from another person toileting, which includes using toliet, bedpan, or urinal?: A Lot Help from another person bathing (including washing, rinsing, drying)?: A Lot Help from another person to put on and taking off regular upper body clothing?: A Little Help from another person to put on and taking off regular lower body clothing?: A Lot 6 Click Score: 17    End of Session Equipment Utilized During Treatment: Rolling walker (2 wheels);Oxygen  OT Visit Diagnosis: Unsteadiness on feet (R26.81);Other abnormalities of gait and mobility (R26.89);Repeated falls (R29.6);Muscle  weakness (generalized) (M62.81)   Activity Tolerance Patient tolerated treatment well   Patient Left in chair;with call bell/phone within reach;with chair alarm set;with family/visitor present   Nurse Communication Mobility status        Time: 8460-8447 OT Time Calculation (min): 13 min  Charges: OT General Charges $OT Visit: 1 Visit OT Treatments $Self Care/Home Management : 8-22 mins  Elston Slot, M.S. OTR/L  06/23/24, 4:08 PM  ascom [phone]

## 2024-06-23 NOTE — Progress Notes (Signed)
 MD aware of 21 beat run of vtach. Patient sob during episode. Triggered by trip to bathroom.

## 2024-06-23 NOTE — Progress Notes (Addendum)
 Progress Note    James Clarke  FMW:[id number] DOB: August 16, 1940  DOA: 06/20/2024 PCP: Jeffie Cheryl BRAVO, MD      Brief Narrative:    Medical records reviewed and are as summarized below:  James Clarke is a 83 y.o. male with medical history significant for morbid obesity, chronic HFrEF, ESRD on hemodialysis on MWF, insulin -dependent diabetes mellitus, hyperlipidemia, who presented to the hospital because of shortness of breath.  He had presented to outpatient hemodialysis for scheduled dialysis on Friday, June 18, 2024.  However, dialysis could not be completed because of equipment failure.  He was asked to return to the dialysis center the following day and he had 4 hours of hemodialysis without incident.  However, on the day of admission, he woke up feeling very short of breath.  Shortness of breath was worse with minimal exertion and it was associated with general weakness.  He was brought to the ED for further management.      Assessment/Plan:   Principal Problem:   CHF (congestive heart failure) (HCC) Active Problems:   Acute hypoxic respiratory failure (HCC)     Body mass index is 43.46 kg/m.  (Morbid obesity)    Acute hypoxic respiratory failure: He is on 3 L/min oxygen via [place].  Wean off oxygen as able.   Chest pain and shortness of breath that occurred during and after hemodialysis: EKG done on 06/23/2024 showed sinus rhythm with APCs, T wave inversions in leads I and aVL,  PVC. Troponins have been ordered. Consulted Dr. Florencio, cardiologist, to assist with management Nonobstructive CAD: Continue aspirin  and statin He had a low risk nuclear stress test in November 2024. Of note, patient has chronically elevated troponins likely from CHF and ESRD.   Acute on chronic systolic CHF: Fluid management with hemodialysis. 2D echo on 06/21/2024 showed EF estimated at 35 to 40%.   ESRD on HD: He had hemodialysis today.   Hypotension with hemodialysis:  Continue midodrine   Paroxysmal atrial fibrillation: Continue Eliquis NSVT: Patient had 25 beat run of NSVT while walking to the bathroom later in the afternoon.  Monitor on telemetry.   Hypokalemia: Potassium 3.4.  Replete potassium because of NSVT.       Comorbidities include chronic leukocytosis, morbid obesity    Diet Order             Diet heart healthy/carb modified Room service appropriate? Yes; Fluid consistency: Thin  Diet effective now                                  Consultants: Cardiologist Nephrologist  Procedures: None    Medications:    apixaban  2.5 mg Oral BID   calcitRIOL   0.25 mcg Oral Daily   Chlorhexidine  Gluconate Cloth  6 each Topical Q0600   insulin  aspart  0-6 Units Subcutaneous TID WC   insulin  aspart  4 Units Subcutaneous TID WC   insulin  glargine-yfgn  38 Units Subcutaneous QHS   isosorbide  mononitrate  30 mg Oral Daily   metoprolol  succinate  50 mg Oral Daily   midodrine  10 mg Oral Once per day on Monday Wednesday Friday   potassium chloride   40 mEq Oral Once   pravastatin   20 mg Oral q1800   sertraline   50 mg Oral QHS   sodium chloride  flush  3 mL Intravenous Q12H   tamsulosin   0.4 mg Oral Daily   Continuous  Infusions:   Anti-infectives (From admission, onward)    None              Family Communication/Anticipated D/C date and plan/Code Status   DVT prophylaxis: apixaban (ELIQUIS) tablet 2.5 mg Start: 06/20/24 2200 SCDs Start: 06/20/24 1706 apixaban (ELIQUIS) tablet 2.5 mg     Code Status: Limited: Do not attempt resuscitation (DNR) -DNR-LIMITED -Do Not Intubate/DNI   Family Communication: Plan discussed with his wife at the bedside Disposition Plan: Plan to discharge home   Status is: Inpatient Remains inpatient appropriate because: Acute hypoxic respiratory failure       Subjective:   Interval events noted.  He complains of chest pain and shortness of breath.  He said  symptoms started during hemodialysis today.  Even after he got back from dialysis, he was still having chest pain.  He describes chest pain as squeezing in nature.  Pain was about 8/10 in severity when it started.  He was given nitroglycerin  warning, from dialysis and pain is down to about 4/10.  He said he has never had the symptoms during hemodialysis before even though he normally feels worn out during and after dialysis.  His wife is at the bedside.  Respiratory therapist was present at the bedside.  Objective:    Vitals:   06/23/24 1329 06/23/24 1330 06/23/24 1356 06/23/24 1555  BP: (!) 82/54 94/65  121/61  Pulse:  78  79  Resp:    18  Temp:    97.8 F (36.6 C)  TempSrc:      SpO2:  97% 97% 94%  Weight:      Height:       No data found.   Intake/Output Summary (Last 24 hours) at 06/23/2024 1733 Last data filed at 06/23/2024 1300 Gross per 24 hour  Intake 120 ml  Output 2900 ml  Net -2780 ml   Filed Weights   06/22/24 1600 06/23/24 0500 06/23/24 0818  Weight: 132 kg 133 kg 133.5 kg    Exam:  GEN: NAD SKIN: Warm and dry EYES: No pallor or icterus ENT: MMM CV: RRR PULM: Bibasilar rales.  No wheezing ABD: soft, ND, NT, +BS CNS: AAO x 3, non focal EXT: No edema or tenderness MSK: Bilateral anterior chest wall tenderness       Data Reviewed:   I have personally reviewed following labs and imaging studies:  Labs: Labs show the following:   Basic Metabolic Panel: Recent Labs  Lab 06/20/24 1516 06/21/24 0444 06/22/24 0516 06/23/24 0453  NA 141 137 138 136  K 3.6 3.9 3.8 3.4*  CL 102 100 99 98  CO2 27 24 28 25   GLUCOSE 144* 253* 202* 173*  BUN 44* 47* 34* 52*  CREATININE 3.21* 3.65* 2.98* 3.66*  CALCIUM 8.9 8.5* 8.5* 8.8*  MG  --  3.9*  --   --    GFR Estimated Creatinine Clearance: 20.7 mL/min (A) (by C-G formula based on SCr of 3.66 mg/dL (H)). Liver Function Tests: Recent Labs  Lab 06/20/24 1516  AST 21  ALT 17  ALKPHOS 95  BILITOT 0.2   PROT 7.0  ALBUMIN 3.0*   No results for input(s): LIPASE, AMYLASE in the last 168 hours. No results for input(s): AMMONIA in the last 168 hours. Coagulation profile Recent Labs  Lab 06/20/24 1516  INR 1.1    CBC: Recent Labs  Lab 06/20/24 1516 06/22/24 1250  WBC 13.2* 12.1*  NEUTROABS 10.5*  --   HGB 9.7* 9.5*  HCT  31.6* 30.1*  MCV 95.5 92.9  PLT 357 353   Cardiac Enzymes: No results for input(s): CKTOTAL, CKMB, CKMBINDEX, TROPONINI in the last 168 hours. BNP (last 3 results) Recent Labs    06/20/24 1516  PROBNP 23,719.0*   CBG: Recent Labs  Lab 06/22/24 1657 06/22/24 2058 06/23/24 1312 06/23/24 1456 06/23/24 1641  GLUCAP 250* 241* 160* 315* 333*   D-Dimer: No results for input(s): DDIMER in the last 72 hours. Hgb A1c: No results for input(s): HGBA1C in the last 72 hours.  Lipid Profile: No results for input(s): CHOL, HDL, LDLCALC, TRIG, CHOLHDL, LDLDIRECT in the last 72 hours. Thyroid  function studies: No results for input(s): TSH, T4TOTAL, T3FREE, THYROIDAB in the last 72 hours.  Invalid input(s): FREET3 Anemia work up: No results for input(s): VITAMINB12, FOLATE, FERRITIN, TIBC, IRON, RETICCTPCT in the last 72 hours. Sepsis Labs: Recent Labs  Lab 06/20/24 1516 06/22/24 1250  WBC 13.2* 12.1*    Microbiology Recent Results (from the past 240 hours)  Resp panel by RT-PCR (RSV, Flu A&B, Covid) Anterior Nasal Swab     Status: None   Collection Time: 06/20/24  3:16 PM   Specimen: Anterior Nasal Swab  Result Value Ref Range Status   SARS Coronavirus 2 by RT PCR NEGATIVE NEGATIVE Final    Comment: (NOTE) SARS-CoV-2 target nucleic acids are NOT DETECTED.  The SARS-CoV-2 RNA is generally detectable in upper respiratory specimens during the acute phase of infection. The lowest concentration of SARS-CoV-2 viral copies this assay can detect is 138 copies/mL. A negative result does not preclude  SARS-Cov-2 infection and should not be used as the sole basis for treatment or other patient management decisions. A negative result may occur with  improper specimen collection/handling, submission of specimen other than nasopharyngeal swab, presence of viral mutation(s) within the areas targeted by this assay, and inadequate number of viral copies(<138 copies/mL). A negative result must be combined with clinical observations, patient history, and epidemiological information. The expected result is Negative.  Fact Sheet for Patients:  bloggercourse.com  Fact Sheet for Healthcare Providers:  seriousbroker.it  This test is no t yet approved or cleared by the [place]  FDA and  has been authorized for detection and/or diagnosis of SARS-CoV-2 by FDA under an Emergency Use Authorization (EUA). This EUA will remain  in effect (meaning this test can be used) for the duration of the COVID-19 declaration under Section 564(b)(1) of the Act, 21 U.S.C.section 360bbb-3(b)(1), unless the authorization is terminated  or revoked sooner.       Influenza A by PCR NEGATIVE NEGATIVE Final   Influenza B by PCR NEGATIVE NEGATIVE Final    Comment: (NOTE) The Xpert Xpress SARS-CoV-2/FLU/RSV plus assay is intended as an aid in the diagnosis of influenza from Nasopharyngeal swab specimens and should not be used as a sole basis for treatment. Nasal washings and aspirates are unacceptable for Xpert Xpress SARS-CoV-2/FLU/RSV testing.  Fact Sheet for Patients: bloggercourse.com  Fact Sheet for Healthcare Providers: seriousbroker.it  This test is not yet approved or cleared by the [place]  FDA and has been authorized for detection and/or diagnosis of SARS-CoV-2 by FDA under an Emergency Use Authorization (EUA). This EUA will remain in effect (meaning this test can be used) for the duration of  the COVID-19 declaration under Section 564(b)(1) of the Act, 21 U.S.C. section 360bbb-3(b)(1), unless the authorization is terminated or revoked.     Resp Syncytial Virus by PCR NEGATIVE NEGATIVE Final    Comment: (NOTE) Fact Sheet for  Patients: bloggercourse.com  Fact Sheet for Healthcare Providers: seriousbroker.it  This test is not yet approved or cleared by the [place]  FDA and has been authorized for detection and/or diagnosis of SARS-CoV-2 by FDA under an Emergency Use Authorization (EUA). This EUA will remain in effect (meaning this test can be used) for the duration of the COVID-19 declaration under Section 564(b)(1) of the Act, 21 U.S.C. section 360bbb-3(b)(1), unless the authorization is terminated or revoked.  Performed at [facility], [address] [place], [place] [address]     Procedures and diagnostic studies:  No results found.             LOS: 3 days   Brailen Macneal  [facility]   Pager on [email or web address]. If 7PM-7AM, please contact night-coverage at [email or web address]     06/23/2024, 5:33 PM

## 2024-06-23 NOTE — Progress Notes (Signed)
 [facility]  ROUNDING NOTE   Subjective:   James Clarke is a 83 y.o. male with past medical history of CAD, diabetes, asthma, sleep apnea, osteoarthritis, obesity, heart failure and end stage renal disease on hemodialysis. Last treatment received on Saturday. Patient presents to ED with shortness of breath and has been admitted for CHF (congestive heart failure) (HCC) [I50.9] Hypoxia [R09.02] ESRD on dialysis (HCC) [N18.6, Z99.2] Dyspnea, unspecified type [R06.00] Acute on chronic congestive heart failure, unspecified heart failure type (HCC) [I50.9] Acute hypoxic respiratory failure (HCC) [J96.01]  Patient seen laying in bed, wife at bedside. [name] and and receives outpatient dialysis treatments at [facility] on a MWF schedule, supervised by Dr. Marcelino.    Update:  Patient seen and evaluated during dialysis   HEMODIALYSIS FLOWSHEET:  Blood Flow Rate (mL/min): 350 mL/min Arterial Pressure (mmHg): -197.59 mmHg Venous Pressure (mmHg): 162.22 mmHg TMP (mmHg): 1.61 mmHg Ultrafiltration Rate (mL/min): 829 mL/min Dialysate Flow Rate (mL/min): 300 ml/min  Weaned to 3L [place].  Denies shortness of breath  Objective:  Vital signs in last 24 hours:  Temp:  [97.4 F (36.3 C)-98.4 F (36.9 C)] 97.9 F (36.6 C) (12/03 0818) Pulse Rate:  [57-78] 70 (12/03 1030) Resp:  [16-24] 20 (12/03 1030) BP: (107-158)/(49-73) 107/64 (12/03 1030) SpO2:  [89 %-100 %] 97 % (12/03 1030) FiO2 (%):  [21 %] 21 % (12/03 0014) Weight:  [132 kg-134.3 kg] 133.5 kg (12/03 0818)  Weight change: -4.7 kg Filed Weights   06/22/24 1600 06/23/24 0500 06/23/24 0818  Weight: 132 kg 133 kg 133.5 kg    Intake/Output: I/O last 3 completed shifts: In: 120 [P.O.:120] Out: 3150 [Urine:650; Other:2500]   Intake/Output this shift:  No intake/output data recorded.  Physical Exam: General: NAD  Head: Normocephalic, atraumatic. Moist oral mucosal membranes  Eyes: Anicteric   Lungs:  Diminished, [place] O2  Heart: Regular rate and rhythm  Abdomen:  Soft, nontender  Extremities: Trace peripheral edema.  Neurologic: Awake, alert, conversant  Skin: Warm,dry, no rash  Access: Right IJ PermCath    Basic Metabolic Panel: Recent Labs  Lab 06/20/24 1516 06/21/24 0444 06/22/24 0516 06/23/24 0453  NA 141 137 138 136  K 3.6 3.9 3.8 3.4*  CL 102 100 99 98  CO2 27 24 28 25   GLUCOSE 144* 253* 202* 173*  BUN 44* 47* 34* 52*  CREATININE 3.21* 3.65* 2.98* 3.66*  CALCIUM 8.9 8.5* 8.5* 8.8*  MG  --  3.9*  --   --     Liver Function Tests: Recent Labs  Lab 06/20/24 1516  AST 21  ALT 17  ALKPHOS 95  BILITOT 0.2  PROT 7.0  ALBUMIN 3.0*   No results for input(s): LIPASE, AMYLASE in the last 168 hours. No results for input(s): AMMONIA in the last 168 hours.  CBC: Recent Labs  Lab 06/20/24 1516 06/22/24 1250  WBC 13.2* 12.1*  NEUTROABS 10.5*  --   HGB 9.7* 9.5*  HCT 31.6* 30.1*  MCV 95.5 92.9  PLT 357 353    Cardiac Enzymes: No results for input(s): CKTOTAL, CKMB, CKMBINDEX, TROPONINI in the last 168 hours.  BNP: Invalid input(s): POCBNP  CBG: Recent Labs  Lab 06/22/24 0043 06/22/24 0946 06/22/24 1120 06/22/24 1657 06/22/24 2058  GLUCAP 251* 207* 311* 250* 241*    Microbiology: Results for orders placed or performed during the hospital encounter of 06/20/24  Resp panel by RT-PCR (RSV, Flu A&B, Covid) Anterior Nasal Swab  Status: None   Collection Time: 06/20/24  3:16 PM   Specimen: Anterior Nasal Swab  Result Value Ref Range Status   SARS Coronavirus 2 by RT PCR NEGATIVE NEGATIVE Final    Comment: (NOTE) SARS-CoV-2 target nucleic acids are NOT DETECTED.  The SARS-CoV-2 RNA is generally detectable in upper respiratory specimens during the acute phase of infection. The lowest concentration of SARS-CoV-2 viral copies this assay can detect is 138 copies/mL. A negative result does not preclude SARS-Cov-2 infection and  should not be used as the sole basis for treatment or other patient management decisions. A negative result may occur with  improper specimen collection/handling, submission of specimen other than nasopharyngeal swab, presence of viral mutation(s) within the areas targeted by this assay, and inadequate number of viral copies(<138 copies/mL). A negative result must be combined with clinical observations, patient history, and epidemiological information. The expected result is Negative.  Fact Sheet for Patients:  bloggercourse.com  Fact Sheet for Healthcare Providers:  seriousbroker.it  This test is no t yet approved or cleared by the [place]  FDA and  has been authorized for detection and/or diagnosis of SARS-CoV-2 by FDA under an Emergency Use Authorization (EUA). This EUA will remain  in effect (meaning this test can be used) for the duration of the COVID-19 declaration under Section 564(b)(1) of the Act, 21 U.S.C.section 360bbb-3(b)(1), unless the authorization is terminated  or revoked sooner.       Influenza A by PCR NEGATIVE NEGATIVE Final   Influenza B by PCR NEGATIVE NEGATIVE Final    Comment: (NOTE) The Xpert Xpress SARS-CoV-2/FLU/RSV plus assay is intended as an aid in the diagnosis of influenza from Nasopharyngeal swab specimens and should not be used as a sole basis for treatment. Nasal washings and aspirates are unacceptable for Xpert Xpress SARS-CoV-2/FLU/RSV testing.  Fact Sheet for Patients: bloggercourse.com  Fact Sheet for Healthcare Providers: seriousbroker.it  This test is not yet approved or cleared by the [place]  FDA and has been authorized for detection and/or diagnosis of SARS-CoV-2 by FDA under an Emergency Use Authorization (EUA). This EUA will remain in effect (meaning this test can be used) for the duration of the COVID-19 declaration  under Section 564(b)(1) of the Act, 21 U.S.C. section 360bbb-3(b)(1), unless the authorization is terminated or revoked.     Resp Syncytial Virus by PCR NEGATIVE NEGATIVE Final    Comment: (NOTE) Fact Sheet for Patients: bloggercourse.com  Fact Sheet for Healthcare Providers: seriousbroker.it  This test is not yet approved or cleared by the [place]  FDA and has been authorized for detection and/or diagnosis of SARS-CoV-2 by FDA under an Emergency Use Authorization (EUA). This EUA will remain in effect (meaning this test can be used) for the duration of the COVID-19 declaration under Section 564(b)(1) of the Act, 21 U.S.C. section 360bbb-3(b)(1), unless the authorization is terminated or revoked.  Performed at [facility], [address] [place], [place] [address]     Coagulation Studies: Recent Labs    06/20/24 1516  LABPROT 15.1  INR 1.1    Urinalysis: No results for input(s): COLORURINE, LABSPEC, PHURINE, GLUCOSEU, HGBUR, BILIRUBINUR, KETONESUR, PROTEINUR, UROBILINOGEN, NITRITE, LEUKOCYTESUR in the last 72 hours.  Invalid input(s): APPERANCEUR    Imaging: No results found.    Medications:      apixaban  2.5 mg Oral BID   calcitRIOL   0.25 mcg Oral Daily   Chlorhexidine  Gluconate Cloth  6 each Topical Q0600   insulin  aspart  0-6 Units Subcutaneous TID WC  insulin  aspart  4 Units Subcutaneous TID WC   insulin  glargine-yfgn  38 Units Subcutaneous QHS   isosorbide  mononitrate  30 mg Oral Daily   metoprolol  succinate  50 mg Oral Daily   midodrine  10 mg Oral Once per day on Monday Wednesday Friday   pravastatin   20 mg Oral q1800   sertraline   50 mg Oral QHS   sodium chloride  flush  3 mL Intravenous Q12H   tamsulosin   0.4 mg Oral Daily   acetaminophen , fluticasone , ipratropium-albuterol , nitroGLYCERIN , ondansetron  (ZOFRAN ) IV, mouth rinse, sodium chloride   flush  Assessment/ Plan:  [name] is a 83 y.o.  male with past medical history of CAD, diabetes, asthma, sleep apnea, osteoarthritis, obesity, heart failure and end stage renal disease on hemodialysis. Last treatment received on Saturday. Patient presents to ED with shortness of breath and has been admitted for CHF (congestive heart failure) (HCC) [I50.9] Hypoxia [R09.02] ESRD on dialysis (HCC) [N18.6, Z99.2] Dyspnea, unspecified type [R06.00] Acute on chronic congestive heart failure, unspecified heart failure type (HCC) [I50.9] Acute hypoxic respiratory failure (HCC) [J96.01]  [facility]/MWF/Rt permcath  End stage renal disease on hemodialysis. Received UF only treatment yesterday with 2.5L fluid removal. Receiving scheduled dialysis today, UF goal 2L. Next treatment scheduled for Friday.  2. Acute respiratory failure, chest x ray shows mild cardiomegaly. History of combined heart failure, ECHO ordered. Room air at baseline, requiring 3L [place]. Will need to attempt to weant ot room air prior to discharge.   3. Anemia of chronic kidney disease Lab Results  Component Value Date   HGB 9.5 (L) 06/22/2024   No need for ESA during this admission.  4. Secondary Hyperparathyroidism: with outpatient labs: None available Lab Results  Component Value Date   CALCIUM 8.8 (L) 06/23/2024   CAION 1.19 06/03/2024   PHOS 4.4 12/13/2022    Bone minerals acceptable. Continue calcitriol  daily.    LOS: 3 James Clarke 12/3/202511:30 AM

## 2024-06-23 NOTE — Progress Notes (Signed)
 PT Cancellation Note  Patient Details Name: James Clarke MRN: [id number] DOB: 22-Sep-1940   Cancelled Treatment:    Reason Eval/Treat Not Completed: Patient at procedure or test/unavailable Pt OTF at dialysis at this time. Will f/u as able.  Richerd Pinal, PT, DPT 06/23/24, 8:33 AM   [name] 06/23/2024, 8:33 AM

## 2024-06-23 NOTE — Plan of Care (Signed)
 Pt is alert oriented x, pt resting. Denies pain. Call button within reach. Pt refused assistance with repositioning. Pure[place] in place. Pt family present at bedside. Problem:  Goal: Ability to describe self-care measures that may prevent or decrease complications (Diabetes Survival Skills Education) will improve Outcome: Progressing Goal: Individualized Educational Video(s) Outcome: Progressing   Problem: Coping: Goal: Ability to adjust to condition or change in health will improve Outcome: Progressing   Problem: Coping: Goal: Ability to adjust to condition or change in health will improve Outcome: Progressing   Problem: Fluid Volume: Goal: Ability to maintain a balanced intake and output will improve Outcome: Progressing   Problem: Health Behavior/Discharge Planning: Goal: Ability to identify and utilize available resources and services will improve Outcome: Progressing Goal: Ability to manage health-related needs will improve Outcome: Progressing   Problem: Metabolic: Goal: Ability to maintain appropriate glucose levels will improve Outcome: Progressing   Problem: Nutritional: Goal: Maintenance of adequate nutrition will improve Outcome: Progressing Goal: Progress toward achieving an optimal weight will improve Outcome: Progressing   Problem: Skin Integrity: Goal: Risk for impaired skin integrity will decrease Outcome: Progressing   Problem: Tissue Perfusion: Goal: Adequacy of tissue perfusion will improve Outcome: Progressing   Problem: Education: Goal: Knowledge of General Education information will improve Description: Including pain rating scale, medication(s)/side effects and non-pharmacologic comfort measures Outcome: Progressing   Problem: Health Behavior/Discharge Planning: Goal: Ability to manage health-related needs will improve Outcome: Progressing   Problem: Clinical Measurements: Goal: Ability to maintain clinical measurements within normal limits  will improve Outcome: Progressing Goal: Will remain free from infection Outcome: Progressing Goal: Diagnostic test results will improve Outcome: Progressing Goal: Respiratory complications will improve Outcome: Progressing Goal: Cardiovascular complication will be avoided Outcome: Progressing   Problem: Activity: Goal: Risk for activity intolerance will decrease Outcome: Progressing   Problem: Nutrition: Goal: Adequate nutrition will be maintained Outcome: Progressing

## 2024-06-24 DIAGNOSIS — I5043 Acute on chronic combined systolic (congestive) and diastolic (congestive) heart failure: Secondary | ICD-10-CM | POA: Diagnosis not present

## 2024-06-24 LAB — CBC
HCT: 34.1 % — ABNORMAL LOW (ref 39.0–52.0)
Hemoglobin: 11 g/dL — ABNORMAL LOW (ref 13.0–17.0)
MCH: 29.3 pg (ref 26.0–34.0)
MCHC: 32.3 g/dL (ref 30.0–36.0)
MCV: 90.9 fL (ref 80.0–100.0)
Platelets: 409 K/uL — ABNORMAL HIGH (ref 150–400)
RBC: 3.75 MIL/uL — ABNORMAL LOW (ref 4.22–5.81)
RDW: 13.2 % (ref 11.5–15.5)
WBC: 11.6 K/uL — ABNORMAL HIGH (ref 4.0–10.5)
nRBC: 0 % (ref 0.0–0.2)

## 2024-06-24 LAB — BASIC METABOLIC PANEL WITH GFR
Anion gap: 13 (ref 5–15)
BUN: 45 mg/dL — ABNORMAL HIGH (ref 8–23)
CO2: 25 mmol/L (ref 22–32)
Calcium: 9.2 mg/dL (ref 8.9–10.3)
Chloride: 93 mmol/L — ABNORMAL LOW (ref 98–111)
Creatinine, Ser: 3.98 mg/dL — ABNORMAL HIGH (ref 0.61–1.24)
GFR, Estimated: 14 mL/min — ABNORMAL LOW (ref >60)
Glucose, Bld: 188 mg/dL — ABNORMAL HIGH (ref 70–99)
Potassium: 3.7 mmol/L (ref 3.5–5.1)
Sodium: 131 mmol/L — ABNORMAL LOW (ref 135–145)

## 2024-06-24 LAB — GLUCOSE, CAPILLARY
Glucose-Capillary: 204 mg/dL — ABNORMAL HIGH (ref 70–99)
Glucose-Capillary: 299 mg/dL — ABNORMAL HIGH (ref 70–99)

## 2024-06-24 LAB — MAGNESIUM: Magnesium: 2.4 mg/dL (ref 1.7–2.4)

## 2024-06-24 MED ORDER — INSULIN ASPART 100 UNIT/ML IJ SOLN: 25.0000 [IU] | Freq: Three times a day (TID) | INTRAMUSCULAR | Status: AC

## 2024-06-24 MED ORDER — METOPROLOL SUCCINATE ER 50 MG PO TB24
50.0000 mg | ORAL_TABLET | Freq: Two times a day (BID) | ORAL | Status: DC
  Administered 2024-06-24: 50 mg via ORAL
  Filled 2024-06-24: qty 1

## 2024-06-24 MED ORDER — INSULIN ASPART 100 UNIT/ML IJ SOLN
8.0000 [IU] | Freq: Three times a day (TID) | INTRAMUSCULAR | Status: DC
  Administered 2024-06-24: 8 [IU] via SUBCUTANEOUS
  Filled 2024-06-24: qty 8

## 2024-06-24 NOTE — Progress Notes (Signed)
 [facility] CLINIC CARDIOLOGY PROGRESS NOTE       Patient ID: James Clarke MRN: [id number] DOB/AGE: [birth year] 83 y.o.  Admit date: 06/20/2024 Referring Physician Dr. Aida Cho Primary Physician Feldpausch, Cheryl BRAVO, MD  Primary Cardiologist Dr. Wilburn Reason for Consultation chest pain  HPI: James Clarke is a 83 y.o. male  with a past medical history of chronic HFrEF, aortic atherosclerosis, moderate nonobstructive CAD 12/2021, hypertension, sleep apnea on BiPAP, type 2 diabetes, hyperlipidemia, ESRD on HD (M,W,F)  who presented to the ED on 06/20/2024 for SOB. Today during dialysis developed CP and SOB which resolved after 1 dose of NTG. Cardiology was consulted for further evaluation.   Interval history: -Patient seen and examined this AM, resting in bedside chair with wife at bedside.  -Reports feeling much better today, no recurrence of CP and SOB improved. Troponins flat yesterday. -BP improved today, less dizziness.  -Had NSVT run overnight.   Review of systems complete and found to be negative unless listed above    Past Medical History:  Diagnosis Date   (HFpEF) heart failure with preserved ejection fraction (HCC)    a.) TTE 11/26/2021: EF 45%, mod LVH, post HK, mod LAE, mild MR/TR, G2DD; b.) TTE 02/26/2022: EF 55-60%, BAE, triv MR, G2DD; c.) TTE 07/09/2022: EF 45%, post HK, LVH, mod LAE, triv TR/PR, mild MR, G1DD   Anemia of chronic renal failure    Aortic atherosclerosis    Asthma    Bell's palsy    BPH (benign prostatic hyperplasia)    CAD (coronary artery disease)    a.) LHC 12/31/2021: 60% mLAD, 20% pRCA, 20% dRCA, 20% o-pLCx --> med mgmt.   CKD (chronic kidney disease), stage IV (HCC)    Diabetic neuropathy (HCC)    Dyspnea on exertion    Ganglion cyst of wrist, right    a.) s/p excision 04/2011   Hepatic steatosis    History of 2019 novel coronavirus disease (COVID-19)    History of bilateral cataract extraction 2018   Hyperlipidemia     Hyperparathyroidism due to renal insufficiency    Hypertension    Low testosterone in male    Nephrolithiasis    Obesity    OSA treated with BiPAP    Osteoarthritis    Peripheral edema    Right inguinal hernia    a.) s/p repair   Skin cancer    Type 2 diabetes mellitus with renal manifestations [facility])     Past Surgical History:  Procedure Laterality Date   A/V FISTULAGRAM Left 01/13/2023   Procedure: A/V Fistulagram;  Surgeon: Marea Selinda RAMAN, MD;  Location: [facility] INVASIVE CV LAB;  Service: Cardiovascular;  Laterality: Left;   A/V SHUNT INTERVENTION Left 04/11/2023   Procedure: A/V SHUNT INTERVENTION;  Surgeon: Marea Selinda RAMAN, MD;  Location: [facility] INVASIVE CV LAB;  Service: Cardiovascular;  Laterality: Left;   APPENDECTOMY     AV FISTULA PLACEMENT Left 09/19/2022   Procedure: ARTERIOVENOUS (AV) FISTULA CREATION (RADIO CEPHALIC);  Surgeon: Marea Selinda RAMAN, MD;  Location: [facility] ORS;  Service: Vascular;  Laterality: Left;   CATARACT EXTRACTION W/PHACO Left 05/20/2017   Procedure: CATARACT EXTRACTION PHACO AND INTRAOCULAR LENS PLACEMENT (IOC);  Surgeon: Jaye Fallow, MD;  Location: [facility] ORS;  Service: Ophthalmology;  Laterality: Left;  US  00:32.0 AP% 15.5 CDE 4.97 Fluid Pack Lot # [id number]   CATARACT EXTRACTION W/PHACO Right 06/10/2017   Procedure: CATARACT EXTRACTION PHACO AND INTRAOCULAR LENS PLACEMENT (IOC);  Surgeon: Jaye Fallow, MD;  Location: [facility] ORS;  Service:  Ophthalmology;  Laterality: Right;  US   00:51 AP% 15.4 CDE 7.95 Fluid pack lot # [id number]   CHOLECYSTECTOMY     COLONOSCOPY     x3   CYSTOSCOPY W/ RETROGRADES Right 08/13/2019   Procedure: CYSTOSCOPY WITH RETROGRADE PYELOGRAM;  Surgeon: Francisca Redell BROCKS, MD;  Location: [facility] ORS;  Service: Urology;  Laterality: Right;   CYSTOSCOPY WITH STENT PLACEMENT Right 07/26/2019   Procedure: CYSTOSCOPY WITH STENT PLACEMENT;  Surgeon: Carolee Sherwood JONETTA DOUGLAS, MD;  Location: [facility] ORS;  Service: Urology;  Laterality: Right;    CYSTOSCOPY/URETEROSCOPY/HOLMIUM LASER/STENT PLACEMENT Right 08/13/2019   Procedure: CYSTOSCOPY/URETEROSCOPY/HOLMIUM LASER/STENT EXCHANGE;  Surgeon: Francisca Redell BROCKS, MD;  Location: [facility] ORS;  Service: Urology;  Laterality: Right;   CYSTOSCOPY/URETEROSCOPY/HOLMIUM LASER/STENT PLACEMENT Left 02/27/2022   Procedure: CYSTOSCOPY/URETEROSCOPY/HOLMIUM LASER/STENT PLACEMENT;  Surgeon: Twylla Glendia BROCKS, MD;  Location: [facility] ORS;  Service: Urology;  Laterality: Left;   DIALYSIS/PERMA CATHETER INSERTION N/A 04/11/2023   Procedure: DIALYSIS/PERMA CATHETER INSERTION;  Surgeon: Marea Selinda RAMAN, MD;  Location: [facility] INVASIVE CV LAB;  Service: Cardiovascular;  Laterality: N/A;   DIALYSIS/PERMA CATHETER INSERTION N/A 06/03/2024   Procedure: DIALYSIS/PERMA CATHETER INSERTION;  Surgeon: Marea Selinda RAMAN, MD;  Location: [facility] INV[facility] CV [facility];  Service: Cardiovascular;  Laterality: N/A;   GANGLION CYST EXCISION Right    INGUINAL HERNIA REPAIR Right    LEFT HEART CATH AND CORONARY ANGIOGRAPHY N/A 12/31/2021   Procedure: LEFT HEART CATH AND CORONARY ANGIOGRAPHY;  Surgeon: Hester Wolm PARAS, MD;  Location: [facility] INV[facility] CV LAB;  Service: Cardiovascular;  Laterality: N/A;   SHOULDER ARTHROSCOPY WITH SUBACROMIAL DECOMPRESSION AND OPEN ROTATOR C Right 09/11/2020   Procedure: Right shoulder arthroscopic rotator cuff repair vs Regeneten patch application, subacromial decompression, and biceps tenodesis - Krystal Doyne to Assist;  Surgeon: Tobie Priest, MD;  Location: [facility] ORS;  Service: Orthopedics;  Laterality: Right;   TEMPORARY DIALYSIS CATHETER N/A 03/05/2022   Procedure: TEMPORARY DIALYSIS CATHETER;  Surgeon: Jama Cordella MATSU, MD;  Location: [facility] INV[facility] CV [facility];  Service: Cardiovascular;  Laterality: N/A;   TONSILLECTOMY     URETEROSCOPY WITH HOLMIUM LASER LITHOTRIPSY Left 02/27/2022   Procedure: URETEROSCOPY WITH HOLMIUM LASER LITHOTRIPSY;  Surgeon: Twylla Glendia BROCKS, MD;  Location: [facility] ORS;  Service: Urology;  Laterality: Left;    WRIST FRACTURE SURGERY Right     Medications Prior to Admission  Medication Sig Dispense Refill Last Dose/Taking   acetaminophen  (TYLENOL ) 500 MG tablet Take 1,000 mg by mouth every 6 (six) hours as needed (shoulder pain).   Unknown   albuterol  (PROVENTIL  HFA;VENTOLIN  HFA) 108 (90 Base) MCG/ACT inhaler Inhale 2 puffs into the lungs every 6 (six) hours as needed for wheezing or shortness of breath.   Unknown   apixaban (ELIQUIS) 2.5 MG TABS tablet Take 2.5 mg by mouth.   06/20/2024   calcitRIOL  (ROCALTROL ) 0.25 MCG capsule Take 0.25 mcg by mouth daily.   Past Week   cetirizine (ZYRTEC) 10 MG chewable tablet Chew 10 mg by mouth daily as needed for allergies.   Unknown   diazepam  (VALIUM ) 2 MG tablet Take 2 mg by mouth every 12 (twelve) hours as needed.   Unknown   fluticasone  (FLONASE ) 50 MCG/ACT nasal spray as needed.   Unknown   furosemide  (LASIX ) 40 MG tablet Take 2 tablets (80 mg total) by mouth 2 (two) times daily. On non dialysis days, Sunday, Tuesday, Thursday, Saturday and Sunday 120 tablet 0 06/20/2024 Morning   insulin  aspart (NOVOLOG ) 100 UNIT/ML injection Inject 14 Units into the skin 3 (three) times  daily with meals. (Patient taking differently: Inject 25-35 Units into the skin 3 (three) times daily with meals. Inject 35 units subcutaneously daily before breakfast, 25 units before lunch and 35 units before supper) 10 mL 11 06/20/2024 Morning   Insulin  Degludec (TRESIBA ) 100 UNIT/ML SOLN Inject 35 Units into the skin at bedtime.   06/19/2024   isosorbide  mononitrate (IMDUR ) 30 MG 24 hr tablet Take 1 tablet (30 mg total) by mouth daily. 30 tablet 2 06/20/2024 Morning   lovastatin (MEVACOR) 40 MG tablet Take 40 mg by mouth daily with supper.   06/19/2024   metoprolol  succinate (TOPROL -XL) 50 MG 24 hr tablet Take 50 mg by mouth in the morning and at bedtime. Take with or immediately following a meal.   06/20/2024   midodrine  (PROAMATINE ) 10 MG tablet Take 10 mg by mouth 3 (three) times a  week. On dialysis days and PRN on non-dialysis days   06/19/2024   mupirocin  ointment (BACTROBAN ) 2 % Apply 1 Application topically 2 (two) times daily. 15 g 0 Unknown   nitroGLYCERIN  (NITROSTAT ) 0.4 MG SL tablet Place 1 tablet (0.4 mg total) under the tongue every 5 (five) minutes as needed for chest pain. 20 tablet 1 Unknown   sertraline  (ZOLOFT ) 50 MG tablet Take 50 mg by mouth at bedtime.   06/19/2024   tamsulosin  (FLOMAX ) 0.4 MG CAPS capsule TAKE (1) CAPSULE BY MOUTH EVERY DAY 30 capsule 11 06/20/2024 Morning   Continuous Blood Gluc Sensor (FREESTYLE LIBRE 14 DAY SENSOR) MISC by Does not apply route.      GLOBAL INJECT EASE INSULIN  SYR 31G X 5/16 0.5 ML MISC Inject into the skin.      glucose blood (ONETOUCH ULTRA) test strip TEST BLOOD SUGAR 4 TIMES DAILY      Lancets (ONETOUCH DELICA PLUS LANCET30G) MISC USE 1 LANCET 4 TIMES DAILY      Social History   Socioeconomic History   Marital status: Married    Spouse name: Not on file   Number of children: Not on file   Years of education: Not on file   Highest education level: Not on file  Occupational History   Not on file  Tobacco Use   Smoking status: Never    Passive exposure: Never   Smokeless tobacco: Never  Vaping Use   Vaping status: Never Used  Substance and Sexual Activity   Alcohol use: No   Drug use: Not Currently   Sexual activity: Yes    Birth control/protection: None  Other Topics Concern   Not on file  Social History Narrative   Not on file   Social Drivers of Health   Financial Resource Strain: Low Risk  (11/06/2023)   Received from [facility]   Overall Financial Resource Strain (CARDIA)    Difficulty of Paying Living Expenses: Not hard at all  Food Insecurity: No Food Insecurity (06/20/2024)   Hunger Vital Sign    Worried About Running Out of Food in the Last Year: Never true    Ran Out of Food in the Last Year: Never true  Transportation Needs: No Transportation Needs (06/20/2024)    PRAPARE - Administrator, Civil Service (Medical): No    Lack of Transportation (Non-Medical): No  Physical Activity: Not on file  Stress: Not on file  Social Connections: Not on file  Intimate Partner Violence: Not At Risk (06/20/2024)   Humiliation, Afraid, Rape, and Kick questionnaire    Fear of Current or Ex-Partner: No  Emotionally Abused: No    Physically Abused: No    Sexually Abused: No    Family History  Problem Relation Age of Onset   Emphysema Mother    COPD Mother    Heart disease Mother    Brain cancer Father      Vitals:   06/23/24 2047 06/23/24 2300 06/24/24 0603 06/24/24 0822  BP: 139/60 138/60 126/60 127/66  Pulse: 76 74 66 72  Resp: 18  18 18   Temp: 97.8 F (36.6 C) 97.9 F (36.6 C) 98.3 F (36.8 C) 97.8 F (36.6 C)  TempSrc:      SpO2: 99% 99% 92% 100%  Weight:   133.5 kg   Height:        PHYSICAL EXAM General: Chronically ill-appearing elderly male, well nourished, in no acute distress. HEENT: Normocephalic and atraumatic. Neck: No JVD.  Lungs: Normal respiratory effort on 3L [place].  Diminished bilaterally. Heart: HRRR. Normal S1 and S2 without gallops or murmurs.  Abdomen: Non-distended appearing.  Msk: Normal strength and tone for age. Extremities: Warm and well perfused. No clubbing, cyanosis.  Trace edema.  Neuro: Alert and oriented X 3. Psych: Answers questions appropriately.   Labs: Basic Metabolic Panel: Recent Labs    06/23/24 0453 06/24/24 0430  NA 136 131*  K 3.4* 3.7  CL 98 93*  CO2 25 25  GLUCOSE 173* 188*  BUN 52* 45*  CREATININE 3.66* 3.98*  CALCIUM 8.8* 9.2   Liver Function Tests: No results for input(s): AST, ALT, ALKPHOS, BILITOT, PROT, ALBUMIN in the last 72 hours. No results for input(s): LIPASE, AMYLASE in the last 72 hours. CBC: Recent Labs    06/22/24 1250 06/24/24 0430  WBC 12.1* 11.6*  HGB 9.5* 11.0*  HCT 30.1* 34.1*  MCV 92.9 90.9  PLT 353 409*   Cardiac Enzymes: No  results for input(s): CKTOTAL, CKMB, CKMBINDEX, TROPONINIHS in the last 72 hours. BNP: No results for input(s): BNP in the last 72 hours. D-Dimer: No results for input(s): DDIMER in the last 72 hours. Hemoglobin A1C: No results for input(s): HGBA1C in the last 72 hours. Fasting Lipid Panel: No results for input(s): CHOL, HDL, LDLCALC, TRIG, CHOLHDL, LDLDIRECT in the last 72 hours. Thyroid  Function Tests: No results for input(s): TSH, T4TOTAL, T3FREE, THYROIDAB in the last 72 hours.  Invalid input(s): FREET3 Anemia Panel: No results for input(s): VITAMINB12, FOLATE, FERRITIN, TIBC, IRON, RETICCTPCT in the last 72 hours.   Radiology: ECHOCARDIOGRAM COMPLETE Result Date: 06/21/2024    ECHOCARDIOGRAM REPORT   Patient Name:   James Clarke Date of Exam: 06/21/2024 Medical Rec #:  [id number]       Height:       69.0 in Accession #:    [id number]      Weight:       306.4 lb Date of Birth:  Jul 18, 1941      BSA:          2.477 m Patient Age:    82 years        BP:           142/71 mmHg Patient Gender: M               HR:           65 bpm. Exam Location:  [facility] Procedure: 2D Echo, Color Doppler and Cardiac Doppler (Both Spectral and Color            Flow Doppler were utilized during procedure). Indications:  CHF--acute systolic I50.21  History:         Patient has prior history of Echocardiogram examinations, most                  recent 05/26/2023. Risk Factors:Dyslipidemia and Diabetes.  Sonographer:     Christopher Furnace Referring Phys:  [id number] MAUDE MARLA DART Diagnosing Phys: Cara JONETTA Lovelace MD  Sonographer Comments: Technically challenging study due to limited acoustic windows, no apical window, no subcostal window and patient is obese. IMPRESSIONS  1. Technically difficult study.  2. Left ventricular ejection fraction, by estimation, is 35 to 40%. The left ventricle has moderately decreased function. The left ventricle demonstrates global hypokinesis. The  left ventricular internal cavity size was moderately dilated. There is moderate concentric left ventricular hypertrophy. Left ventricular diastolic function could not be evaluated.  3. Right ventricular systolic function is normal. The right ventricular size is normal.  4. The mitral valve is normal in structure. No evidence of mitral valve regurgitation.  5. The aortic valve is normal in structure. Aortic valve regurgitation is not visualized. Conclusion(s)/Recommendation(s): Poor windows for evaluation of left ventricular function by transthoracic echocardiography. Would recommend an alternative means of evaluation. FINDINGS  Left Ventricle: Left ventricular ejection fraction, by estimation, is 35 to 40%. The left ventricle has moderately decreased function. The left ventricle demonstrates global hypokinesis. Strain was performed and the global longitudinal strain is indeterminate. The left ventricular internal cavity size was moderately dilated. There is moderate concentric left ventricular hypertrophy. Left ventricular diastolic function could not be evaluated. Right Ventricle: The right ventricular size is normal. No increase in right ventricular wall thickness. Right ventricular systolic function is normal. Left Atrium: Left atrial size was normal in size. Right Atrium: Right atrial size was normal in size. Pericardium: The pericardium was not well visualized. Mitral Valve: The mitral valve is normal in structure. No evidence of mitral valve regurgitation. Tricuspid Valve: The tricuspid valve is normal in structure. Tricuspid valve regurgitation is not demonstrated. Aortic Valve: The aortic valve is normal in structure. Aortic valve regurgitation is not visualized. Pulmonic Valve: The pulmonic valve was normal in structure. Pulmonic valve regurgitation is not visualized. Aorta: The ascending aorta was not well visualized. IAS/Shunts: No atrial level shunt detected by color flow Doppler. Additional Comments:  Technically difficult study. 3D was performed not requiring image post processing on an independent workstation and was indeterminate.  LEFT VENTRICLE PLAX 2D LVIDd:         5.80 cm LVIDs:         4.70 cm LV PW:         1.40 cm LV IVS:        1.60 cm LVOT diam:     2.10 cm LVOT Area:     3.46 cm  LEFT ATRIUM         Index LA diam:    4.20 cm 1.70 cm/m   AORTA Ao Root diam: 3.10 cm  SHUNTS Systemic Diam: 2.10 cm Cara JONETTA Lovelace MD Electronically signed by Cara JONETTA Lovelace MD Signature Date/Time: 06/21/2024/11:25:48 PM    Final    DG Chest 1 View Result Date: 06/20/2024 CLINICAL DATA:  Short of breath. EXAM: CHEST  1 VIEW COMPARISON:  05/27/2023. FINDINGS: Mild enlargement of the cardiac silhouette. No mediastinal or hilar masses. Right internal jugular tunneled dual lumen central venous catheter is stable. Bilateral interstitial thickening is similar to the prior radiograph. No lung consolidation. No pleural effusion or pneumothorax. Skeletal structures are grossly intact.  IMPRESSION: 1. No acute cardiopulmonary disease. 2. Mild cardiomegaly and chronic interstitial thickening. Electronically Signed   By: Alm Parkins M.D.   On: 06/20/2024 16:00   PERIPHERAL VASCULAR CATHETERIZATION Result Date: 06/03/2024 See surgical note for result.   ECHO 06/21/2024: 1. Technically difficult study.   2. Left ventricular ejection fraction, by estimation, is 35 to 40%. The left ventricle has moderately decreased function. The left ventricle demonstrates global hypokinesis. The left ventricular internal cavity size  was moderately dilated. There is moderate concentric left ventriular hypertrophy. Left ventricular diastolic function could not be evaluated.   3. Right ventricular systolic function is normal. The right ventricular  size is normal.   4. The mitral valve is normal in structure. No evidence of mitral valve  regurgitation.   5. The aortic valve is normal in structure. Aortic valve regurgitation is   not visualized.   TELEMETRY (personally reviewed): sinus rhythm rate 70s  EKG (personally reviewed): NSR PVCs rate 79 bpm  Data reviewed by me 06/24/2024: last 24h vitals tele labs imaging I/O ED provider note, admission H&P, hospitalist progress note  Principal Problem:   CHF (congestive heart failure) (HCC) Active Problems:   Acute hypoxic respiratory failure (HCC)    ASSESSMENT AND PLAN:  James Clarke is a 83 y.o. male  with a past medical history of chronic HFrEF, aortic atherosclerosis, moderate nonobstructive CAD 12/2021, hypertension, sleep apnea on BiPAP, type 2 diabetes, hyperlipidemia, ESRD on HD (M,W,F)  who presented to the ED on 06/20/2024 for SOB. Today during dialysis developed CP and SOB which resolved after 1 dose of NTG. Cardiology was consulted for further evaluation.   # Chest pain # Acute on chronic HFrEF # ESRD on HD Patient with an episode of CP, SOB during HD today. Resolved with one dose of nitroglycerin  and a breathing treatment.  Did have drop in BP after nitro administered.  Overall feeling improved this afternoon. -Continue Eliquis  2.5 mg twice daily. -Resume metoprolol  succinate 50 mg twice daily on nondialysis days and 50 mg p.m. on dialysis days, continue Imdur  30 mg.  Previously has not been able to tolerate other GDMT due to dizziness. -Would not recommend any invasive evaluation at this time, patient has known history of coronary disease and no other episodes of chest discomfort recently.   This patient's plan of care was discussed and created with Dr. Florencio and he is in agreement.  Signed: Danita Bloch, PA-C  06/24/2024, 8:34 AM [facility] Cardiology

## 2024-06-24 NOTE — Progress Notes (Signed)
 SATURATION QUALIFICATIONS: (This note is used to comply with regulatory documentation for home oxygen)  Patient Saturations on Room Air at Rest = 93%  Patient Saturations on Room Air while Ambulating = 87%  Patient Saturations on 3 Liters of oxygen while Ambulating = 90%  Please briefly explain why patient needs home oxygen: Patient oxygen saturation drops to < 88% with mobility on room air.   Adalae Baysinger, PT, GCS 06/24/24,1:31 PM

## 2024-06-24 NOTE — TOC Progression Note (Signed)
 Transition of Care [facility]) - Progression Note    Patient Details  Name: James Clarke MRN: [id number] Date of Birth: 11-05-1940  [facility] CM/SW Contact  Shasta DELENA Daring, RN Phone Number: 06/24/2024, 3:22 PM  Clinical Narrative:    RNCM spoke to patient and wife. They selected [employer] as O2 provider. Notified Mitch/ representative that patient is being discharged with O2.   Discharge note is entered and signed.    Expected Discharge Plan: Home w Home Health Services                 Expected Discharge Plan and Services       Living arrangements for the past 2 months: Single Family Home Expected Discharge Date: 06/24/24                                     Social Drivers of Health (SDOH) Interventions SDOH Screenings   Food Insecurity: No Food Insecurity (06/20/2024)  Housing: Low Risk  (06/20/2024)  Transportation Needs: No Transportation Needs (06/20/2024)  Utilities: Not At Risk (06/20/2024)  Depression (PHQ2-9): Low Risk  (10/30/2021)  Financial Resource Strain: Low Risk  (11/06/2023)   Received from [facility]  Tobacco Use: Low Risk  (06/20/2024)    Readmission Risk Interventions    05/28/2023   12:24 PM 02/27/2022    4:22 PM  Readmission Risk Prevention Plan  Transportation Screening Complete Complete  PCP or Specialist Appt within 3-5 Days Complete   HRI or Home Care Consult  Complete  Social Work Consult for Recovery Care Planning/Counseling Complete   Palliative Care Screening Not Applicable Not Applicable  Medication Review Oceanographer) Complete Complete

## 2024-06-24 NOTE — Inpatient Diabetes Management (Signed)
 Inpatient Diabetes Program Recommendations  AACE/ADA: New Consensus Statement on Inpatient Glycemic Control   Target Ranges:  Prepandial:   less than 140 mg/dL      Peak postprandial:   less than 180 mg/dL (1-2 hours)      Critically ill patients:  140 - 180 mg/dL    Latest Reference Range & Units 06/22/24 00:43 06/22/24 09:46 06/22/24 11:20 06/22/24 16:57 06/22/24 20:58 06/23/24 13:12 06/23/24 14:56 06/23/24 16:41 06/23/24 21:15  Glucose-Capillary 70 - 99 mg/dL 748 (H) 792 (H) 688 (H) 250 (H) 241 (H) 160 (H) 315 (H) 333 (H) 244 (H)   Review of Glycemic Control  Diabetes history: DM2 Outpatient Diabetes medications: Tresiba 35 units at bedtime, Novolog  14 units TID with meals (per home med list pt is taking 35 units with breakfast, 25 units with lunch, 35 units with supper) Current orders for Inpatient glycemic control: Semglee  38 units at bedtime, Novolog  0-6 units TID with meals, Novolog  4 units TID with meals   Inpatient Diabetes Program Recommendations:    Insulin : Post prandial glucose consistently elevated (up to 333 mg/dl on 87/6/74).   Please consider increasing meal coverage to Novolog  8 units TID with meals.  Thanks, Earnie Gainer, RN, MSN, CDCES Diabetes Coordinator Inpatient Diabetes Program [phone] (Team Pager from 8am to 5pm)

## 2024-06-24 NOTE — Progress Notes (Signed)
 Physical Therapy Treatment Patient Details Name: James Clarke MRN: [id number] DOB: 06/19/1941 Today's Date: 06/24/2024   History of Present Illness Pt is an 83 y/o M admitted on 06/20/24 after presenting with c/o increased SOB. Pt is being treated for acute hypoxemic respiratory failure. PMH: obesity, ESRD on HD MWF, HFpEF, DM, HLD, asthma, Bell's palsy, CAD, neuropathy, OSA on BiPAP, OA    PT Comments  Patient very pleasant, agrees to PT. States he has been ambulating to bathroom today. He is mod I with bed mobility, use of rails, increased time and effort. He stands with min A. Ambulated 40 feet with RW and supervision. Patient O2 sats drop to 87% on room air. Increased > 90% with 3 liters O2 with mobility. Patient will continue to benefit from skilled PT to improve endurance and strength.      If plan is discharge home, recommend the following: A little help with walking and/or transfers;A little help with bathing/dressing/bathroom;Assistance with cooking/housework;Assist for transportation;Help with stairs or ramp for entrance   Can travel by private vehicle      yes  Equipment Recommendations  None recommended by PT    Recommendations for Other Services       Precautions / Restrictions Precautions Precaution/Restrictions Comments: Mod fall Restrictions Weight Bearing Restrictions Per Provider Order: No     Mobility  Bed Mobility Overal bed mobility: Modified Independent Bed Mobility: Supine to Sit     Supine to sit: Modified independent (Device/Increase time), Used rails, HOB elevated          Transfers Overall transfer level: Needs assistance Equipment used: Rolling walker (2 wheels) Transfers: Sit to/from Stand Sit to Stand: Min assist                Ambulation/Gait Ambulation/Gait assistance: Supervision Gait Distance (Feet): 40 Feet Assistive device: Rolling walker (2 wheels) Gait Pattern/deviations: Step-through pattern, Decreased step length -  right, Decreased step length - left, Decreased stride length Gait velocity: decreased     General Gait Details: patient ambulated ~ 40 feet, O2 dropped to 87% on room air, back up to 90% on 3 liters with ambulation   Stairs             Wheelchair Mobility     Tilt Bed    Modified Rankin (Stroke Patients Only)       Balance Overall balance assessment: Modified Independent Sitting-balance support: Feet supported Sitting balance-Leahy Scale: Good     Standing balance support: Bilateral upper extremity supported, During functional activity, Reliant on assistive device for balance Standing balance-Leahy Scale: Good                              Communication Communication Communication: No apparent difficulties  Cognition Arousal: Alert Behavior During Therapy: WFL for tasks assessed/performed   PT - Cognitive impairments: No apparent impairments                         Following commands: Intact      Cueing Cueing Techniques: Verbal cues  Exercises      General Comments        Pertinent Vitals/Pain      Home Living                          Prior Function            PT Goals (current  goals can now be found in the care plan section) Acute Rehab PT Goals Patient Stated Goal: improved breathing, go home PT Goal Formulation: With patient/family Time For Goal Achievement: 07/05/24 Potential to Achieve Goals: Good Progress towards PT goals: Progressing toward goals    Frequency    Min 2X/week      PT Plan      Co-evaluation              AM-PAC PT 6 Clicks Mobility   Outcome Measure  Help needed turning from your back to your side while in a flat bed without using bedrails?: A Little Help needed moving from lying on your back to sitting on the side of a flat bed without using bedrails?: A Little Help needed moving to and from a bed to a chair (including a wheelchair)?: A Little Help needed standing  up from a chair using your arms (e.g., wheelchair or bedside chair)?: A Little Help needed to walk in hospital room?: A Little Help needed climbing 3-5 steps with a railing? : A Little 6 Click Score: 18    End of Session Equipment Utilized During Treatment: Oxygen Activity Tolerance: Patient tolerated treatment well Patient left: in bed;with call bell/phone within reach;with family/visitor present Nurse Communication: Mobility status PT Visit Diagnosis: Difficulty in walking, not elsewhere classified (R26.2);Other abnormalities of gait and mobility (R26.89);Muscle weakness (generalized) (M62.81)     Time: 8692-8676 PT Time Calculation (min) (ACUTE ONLY): 16 min  Charges:    $Gait Training: 8-22 mins PT General Charges $$ ACUTE PT VISIT: 1 Visit                     Tima Curet, PT, GCS 06/24/24,1:29 PM

## 2024-06-24 NOTE — TOC Transition Note (Signed)
 Transition of Care [facility]) - Discharge Note   Patient Details  Name: James Clarke MRN: [id number] Date of Birth: [birth year]  [facility] CM/SW Contact:  Shasta DELENA Daring, RN Phone Number: 06/24/2024, 3:41 PM   Clinical Narrative:    RNCM confirmed that O2 set up was delivered. Patient and wife will be transported home by a friend. They have already placed the call for pick-up. No additional TOC needs identified. RNCM signing off.    Final next level of care: Home/Self Care Barriers to Discharge: Barriers Resolved   Patient Goals and CMS Choice            Discharge Placement                       Discharge Plan and Services Additional resources added to the After Visit Summary for                    DME Agency: [employer] Date DME Agency Contacted: 06/24/24 Time DME Agency Contacted: 1404 Representative spoke with at DME Agency: Thomasina            Social Drivers of Health (SDOH) Interventions SDOH Screenings   Food Insecurity: No Food Insecurity (06/20/2024)  Housing: Low Risk  (06/20/2024)  Transportation Needs: No Transportation Needs (06/20/2024)  Utilities: Not At Risk (06/20/2024)  Depression (PHQ2-9): Low Risk  (10/30/2021)  Financial Resource Strain: Low Risk  (11/06/2023)   Received from [facility]  Tobacco Use: Low Risk  (06/20/2024)     Readmission Risk Interventions    05/28/2023   12:24 PM 02/27/2022    4:22 PM  Readmission Risk Prevention Plan  Transportation Screening Complete Complete  PCP or Specialist Appt within 3-5 Days Complete   HRI or Home Care Consult  Complete  Social Work Consult for Recovery Care Planning/Counseling Complete   Palliative Care Screening Not Applicable Not Applicable  Medication Review Oceanographer) Complete Complete

## 2024-06-24 NOTE — Progress Notes (Signed)
 [facility]  ROUNDING NOTE   Subjective:   James Clarke is a 83 y.o. male with past medical history of CAD, diabetes, asthma, sleep apnea, osteoarthritis, obesity, heart failure and end stage renal disease on hemodialysis. Last treatment received on Saturday. Patient presents to ED with shortness of breath and has been admitted for CHF (congestive heart failure) (HCC) [I50.9] Hypoxia [R09.02] ESRD on dialysis (HCC) [N18.6, Z99.2] Dyspnea, unspecified type [R06.00] Acute on chronic congestive heart failure, unspecified heart failure type (HCC) [I50.9] Acute hypoxic respiratory failure (HCC) [J96.01]  Patient seen laying in bed, wife at bedside. [name] and and receives outpatient dialysis treatments at [facility] on a MWF schedule, supervised by Dr. Marcelino.    Update:  Patient seen sitting up in chair Wife at bedside Patient and wife state they had a long night Patietn reports he began feeling unwell during dialysis. Could not explain what unwell felt like but states he felt worse the longer it went. He did not notify the HD staff.  Appears to have had chest pain after treatment, once he arrived to his room Chart review states he experienced some VTACH also.   Objective:  Vital signs in last 24 hours:  Temp:  [97.8 F (36.6 C)-98.3 F (36.8 C)] 97.8 F (36.6 C) (12/04 1218) Pulse Rate:  [66-79] 71 (12/04 1218) Resp:  [17-18] 17 (12/04 1218) BP: (115-139)/(50-66) 115/50 (12/04 1218) SpO2:  [92 %-100 %] 98 % (12/04 1324) FiO2 (%):  [32 %] 32 % (12/03 1356) Weight:  [133.5 kg] 133.5 kg (12/04 0603)  Weight change: -0.8 kg Filed Weights   06/23/24 0500 06/23/24 0818 06/24/24 0603  Weight: 133 kg 133.5 kg 133.5 kg    Intake/Output: I/O last 3 completed shifts: In: 120 [P.O.:120] Out: 3300 [Urine:1300; Other:2000]   Intake/Output this shift:  No intake/output data recorded.  Physical Exam: General: NAD  Head: Normocephalic,  atraumatic. Moist oral mucosal membranes  Eyes: Anicteric  Lungs:  Diminished, [place] O2  Heart: Regular rate and rhythm  Abdomen:  Soft, nontender  Extremities: Trace peripheral edema.  Neurologic: Awake, alert, conversant  Skin: Warm,dry, no rash  Access: Right IJ PermCath    Basic Metabolic Panel: Recent Labs  Lab 06/20/24 1516 06/21/24 0444 06/22/24 0516 06/23/24 0453 06/24/24 0429 06/24/24 0430  NA 141 137 138 136  --  131*  K 3.6 3.9 3.8 3.4*  --  3.7  CL 102 100 99 98  --  93*  CO2 27 24 28 25   --  25  GLUCOSE 144* 253* 202* 173*  --  188*  BUN 44* 47* 34* 52*  --  45*  CREATININE 3.21* 3.65* 2.98* 3.66*  --  3.98*  CALCIUM 8.9 8.5* 8.5* 8.8*  --  9.2  MG  --  3.9*  --   --  2.4  --     Liver Function Tests: Recent Labs  Lab 06/20/24 1516  AST 21  ALT 17  ALKPHOS 95  BILITOT 0.2  PROT 7.0  ALBUMIN 3.0*   No results for input(s): LIPASE, AMYLASE in the last 168 hours. No results for input(s): AMMONIA in the last 168 hours.  CBC: Recent Labs  Lab 06/20/24 1516 06/22/24 1250 06/24/24 0430  WBC 13.2* 12.1* 11.6*  NEUTROABS 10.5*  --   --   HGB 9.7* 9.5* 11.0*  HCT 31.6* 30.1* 34.1*  MCV 95.5 92.9 90.9  PLT 357 353 409*    Cardiac Enzymes: No results  for input(s): CKTOTAL, CKMB, CKMBINDEX, TROPONINI in the last 168 hours.  BNP: Invalid input(s): POCBNP  CBG: Recent Labs  Lab 06/23/24 1456 06/23/24 1641 06/23/24 2115 06/24/24 0823 06/24/24 1217  GLUCAP 315* 333* 244* 204* 299*    Microbiology: Results for orders placed or performed during the hospital encounter of 06/20/24  Resp panel by RT-PCR (RSV, Flu A&B, Covid) Anterior Nasal Swab     Status: None   Collection Time: 06/20/24  3:16 PM   Specimen: Anterior Nasal Swab  Result Value Ref Range Status   SARS Coronavirus 2 by RT PCR NEGATIVE NEGATIVE Final    Comment: (NOTE) SARS-CoV-2 target nucleic acids are NOT DETECTED.  The SARS-CoV-2 RNA is generally detectable in  upper respiratory specimens during the acute phase of infection. The lowest concentration of SARS-CoV-2 viral copies this assay can detect is 138 copies/mL. A negative result does not preclude SARS-Cov-2 infection and should not be used as the sole basis for treatment or other patient management decisions. A negative result may occur with  improper specimen collection/handling, submission of specimen other than nasopharyngeal swab, presence of viral mutation(s) within the areas targeted by this assay, and inadequate number of viral copies(<138 copies/mL). A negative result must be combined with clinical observations, patient history, and epidemiological information. The expected result is Negative.  Fact Sheet for Patients:  bloggercourse.com  Fact Sheet for Healthcare Providers:  seriousbroker.it  This test is no t yet approved or cleared by the [place]  FDA and  has been authorized for detection and/or diagnosis of SARS-CoV-2 by FDA under an Emergency Use Authorization (EUA). This EUA will remain  in effect (meaning this test can be used) for the duration of the COVID-19 declaration under Section 564(b)(1) of the Act, 21 U.S.C.section 360bbb-3(b)(1), unless the authorization is terminated  or revoked sooner.       Influenza A by PCR NEGATIVE NEGATIVE Final   Influenza B by PCR NEGATIVE NEGATIVE Final    Comment: (NOTE) The Xpert Xpress SARS-CoV-2/FLU/RSV plus assay is intended as an aid in the diagnosis of influenza from Nasopharyngeal swab specimens and should not be used as a sole basis for treatment. Nasal washings and aspirates are unacceptable for Xpert Xpress SARS-CoV-2/FLU/RSV testing.  Fact Sheet for Patients: bloggercourse.com  Fact Sheet for Healthcare Providers: seriousbroker.it  This test is not yet approved or cleared by the [place]  FDA and has been  authorized for detection and/or diagnosis of SARS-CoV-2 by FDA under an Emergency Use Authorization (EUA). This EUA will remain in effect (meaning this test can be used) for the duration of the COVID-19 declaration under Section 564(b)(1) of the Act, 21 U.S.C. section 360bbb-3(b)(1), unless the authorization is terminated or revoked.     Resp Syncytial Virus by PCR NEGATIVE NEGATIVE Final    Comment: (NOTE) Fact Sheet for Patients: bloggercourse.com  Fact Sheet for Healthcare Providers: seriousbroker.it  This test is not yet approved or cleared by the [place]  FDA and has been authorized for detection and/or diagnosis of SARS-CoV-2 by FDA under an Emergency Use Authorization (EUA). This EUA will remain in effect (meaning this test can be used) for the duration of the COVID-19 declaration under Section 564(b)(1) of the Act, 21 U.S.C. section 360bbb-3(b)(1), unless the authorization is terminated or revoked.  Performed at [facility], [address] [place], [place] [address]     Coagulation Studies: No results for input(s): LABPROT, INR in the last 72 hours.   Urinalysis: No results for input(s): COLORURINE, LABSPEC,  PHURINE, GLUCOSEU, HGBUR, BILIRUBINUR, KETONESUR, PROTEINUR, UROBILINOGEN, NITRITE, LEUKOCYTESUR in the last 72 hours.  Invalid input(s): APPERANCEUR    Imaging: No results found.    Medications:      apixaban  2.5 mg Oral BID   calcitRIOL   0.25 mcg Oral Daily   Chlorhexidine  Gluconate Cloth  6 each Topical Q0600   insulin  aspart  0-6 Units Subcutaneous TID WC   insulin  aspart  8 Units Subcutaneous TID WC   insulin  glargine-yfgn  38 Units Subcutaneous QHS   isosorbide  mononitrate  30 mg Oral Daily   metoprolol  succinate  50 mg Oral BID   midodrine  10 mg Oral Once per day on Monday Wednesday Friday   pravastatin   20 mg Oral q1800   sertraline   50 mg Oral  QHS   sodium chloride  flush  3 mL Intravenous Q12H   tamsulosin   0.4 mg Oral Daily   acetaminophen , fluticasone , ipratropium-albuterol , nitroGLYCERIN , ondansetron  (ZOFRAN ) IV, mouth rinse, sodium chloride  flush  Assessment/ Plan:  [name] is a 83 y.o.  male with past medical history of CAD, diabetes, asthma, sleep apnea, osteoarthritis, obesity, heart failure and end stage renal disease on hemodialysis. Last treatment received on Saturday. Patient presents to ED with shortness of breath and has been admitted for CHF (congestive heart failure) (HCC) [I50.9] Hypoxia [R09.02] ESRD on dialysis (HCC) [N18.6, Z99.2] Dyspnea, unspecified type [R06.00] Acute on chronic congestive heart failure, unspecified heart failure type (HCC) [I50.9] Acute hypoxic respiratory failure (HCC) [J96.01]  [facility]/MWF/Rt permcath  End stage renal disease on hemodialysis. Dialysis received yesterday, UF 2L achieved. Patient had 3 consecutive days of dialysis with 6.5L of fluid removal. Will allow patient to rest today. Will check Mg level, frequent dialysis treatments can result in multiple electrolyte shifts, causing arrhythmias.  Next treatment scheduled for Friday.  2. Acute respiratory failure, chest x ray shows mild cardiomegaly. History of combined heart failure, ECHO ordered. Appears to need oxygen at discharge, will defer to primary team  3. Anemia of chronic kidney disease Lab Results  Component Value Date   HGB 11.0 (L) 06/24/2024  Hgb 11.0 No need for ESA during this admission.  4. Secondary Hyperparathyroidism: with outpatient labs: None available Lab Results  Component Value Date   CALCIUM 9.2 06/24/2024   CAION 1.19 06/03/2024   PHOS 4.4 12/13/2022    Awaiting updated phos. Continue calcitriol  daily.    LOS: 4 Aleasha Fregeau 12/4/20251:53 PM

## 2024-06-24 NOTE — Plan of Care (Incomplete)
 Pt is alert oriented x 4. Pt is wearing cpap, was wearing 3L nasal cannula. Pt is ambulatory, stand by assist. Pt had dialysis yesterday. Pt denies chest pain. Call button within reach. No distress noted.    Problem: Education: Goal: Ability to describe self-care measures that may prevent or decrease complications (Diabetes Survival Skills Education) will improve Outcome: Progressing Goal: Individualized Educational Video(s) Outcome: Progressing   Problem: Coping: Goal: Ability to adjust to condition or change in health will improve Outcome: Progressing   Problem: Fluid Volume: Goal: Ability to maintain a balanced intake and output will improve Outcome: Progressing   Problem: Health Behavior/Discharge Planning: Goal: Ability to identify and utilize available resources and services will improve Outcome: Progressing Goal: Ability to manage health-related needs will improve Outcome: Progressing   Problem: Metabolic: Goal: Ability to maintain appropriate glucose levels will improve Outcome: Progressing   Problem: Nutritional: Goal: Maintenance of adequate nutrition will improve Outcome: Progressing Goal: Progress toward achieving an optimal weight will improve Outcome: Progressing   Problem: Skin Integrity: Goal: Risk for impaired skin integrity will decrease Outcome: Progressing   Problem: Tissue Perfusion: Goal: Adequacy of tissue perfusion will improve Outcome: Progressing   Problem: Education: Goal: Knowledge of General Education information will improve Description: Including pain rating scale, medication(s)/side effects and non-pharmacologic comfort measures Outcome: Progressing   Problem: Health Behavior/Discharge Planning: Goal: Ability to manage health-related needs will improve Outcome: Progressing   Problem: Clinical Measurements: Goal: Ability to maintain clinical measurements within normal limits will improve Outcome: Progressing Goal: Will remain free  from infection Outcome: Progressing Goal: Diagnostic test results will improve Outcome: Progressing Goal: Respiratory complications will improve Outcome: Progressing Goal: Cardiovascular complication will be avoided Outcome: Progressing   Problem: Activity: Goal: Risk for activity intolerance will decrease Outcome: Progressing   Problem: Nutrition: Goal: Adequate nutrition will be maintained Outcome: Progressing   Problem: Coping: Goal: Level of anxiety will decrease Outcome: Progressing   Problem: Elimination: Goal: Will not experience complications related to bowel motility Outcome: Progressing Goal: Will not experience complications related to urinary retention Outcome: Progressing   Problem: Pain Managment: Goal: General experience of comfort will improve and/or be controlled Outcome: Progressing   Problem: Safety: Goal: Ability to remain free from injury will improve Outcome: Progressing   Problem: Skin Integrity: Goal: Risk for impaired skin integrity will decrease Outcome: Progressing   Problem: Skin Integrity: Goal: Risk for impaired skin integrity will decrease Outcome: Progressing

## 2024-06-24 NOTE — Discharge Summary (Signed)
 Physician Discharge Summary   Patient: James Clarke MRN: [id number] DOB: May 31, 1941  Admit date:     06/20/2024  Discharge date: 06/24/24  Discharge Physician: AIDA CHO   PCP: Jeffie Cheryl BRAVO, MD   Recommendations at discharge:   Follow-up with PCP in 1 week  Discharge Diagnoses: Principal Problem:   CHF (congestive heart failure) (HCC) Active Problems:   Acute hypoxic respiratory failure (HCC)  Resolved Problems:   * No resolved hospital problems. *  Hospital Course:  James Clarke is a 83 y.o. male with medical history significant for morbid obesity, chronic HFrEF, ESRD on hemodialysis on MWF, insulin -dependent diabetes mellitus, hyperlipidemia, who presented to the hospital because of shortness of breath.  He had presented to outpatient hemodialysis for scheduled dialysis on Friday, June 18, 2024.  However, dialysis could not be completed because of equipment failure.  He was asked to return to the dialysis center the following day and he had 4 hours of hemodialysis without incident.  However, on the day of admission, he woke up feeling very short of breath.  Shortness of breath was worse with minimal exertion and it was associated with general weakness.  He was brought to the ED for further management.      Assessment and Plan:   Acute on chronic hypoxic respiratory failure: Oxygen saturation dropped to 87% on room air with ambulation and with 3 L of oxygen oxygen saturation improved to 90% while ambulating.   Patient will need home oxygen at discharge.     Chest pain and shortness of breath that occurred during and after hemodialysis on 06/23/2024: Chest pain and shortness of breath have resolved.  Troponins-117 and 122 on 07/20/2024.  Troponins on admission 140 and second troponin was 132 on 06/20/2024. He was evaluated by cardiologist.  No plan for any invasive procedures. Nonobstructive CAD: Continue aspirin  and statin He had a low risk nuclear stress  test in November 2024. Of note, patient has chronically elevated troponins likely from CHF and ESRD.     Acute on chronic systolic CHF: Fluid management with hemodialysis. 2D echo on 06/21/2024 showed EF estimated at 35 to 40%.     ESRD on HD: Case discussed with Dr. Marcelino, nephrologist and Faith, NP, with nephrologist at the bedside.  Patient will have hemodialysis as an outpatient tomorrow.     Hypotension with hemodialysis: Continue midodrine     Paroxysmal atrial fibrillation: Continue Eliquis and metoprolol  NSVT: Patient had 25 beat run of NSVT while walking to the bathroom.  No acute issues.    Hypokalemia: Improved.     Comorbidities include chronic leukocytosis, morbid obesity   Discharge plan was discussed with the patient and his wife at the bedside.  He feels much better and feels he is ready for discharge.  Discharge plan was also discussed with Dr. Marcelino, nephrologist and Faith, NP, with nephrology team, at the bedside.  They said patient can have hemodialysis tomorrow as an outpatient.       Consultants: Nephrologist, cardiologist Procedures performed: None  Disposition: Home health Diet recommendation:  Discharge Diet Orders (From admission, onward)     Start     Ordered   06/24/24 0000  Diet renal/carb modified with fluid restriction        06/24/24 1328           Renal diet, carb modified DISCHARGE MEDICATION: Allergies as of 06/24/2024       Reactions   Codeine Nausea And Vomiting   Doxycycline  Erythromycin Rash        Medication List     STOP taking these medications    diazepam  2 MG tablet Commonly known as: VALIUM    mupirocin  ointment 2 % Commonly known as: BACTROBAN        TAKE these medications    acetaminophen  500 MG tablet Commonly known as: TYLENOL  Take 1,000 mg by mouth every 6 (six) hours as needed (shoulder pain).   albuterol  108 (90 Base) MCG/ACT inhaler Commonly known as: VENTOLIN  HFA Inhale 2  puffs into the lungs every 6 (six) hours as needed for wheezing or shortness of breath.   calcitRIOL  0.25 MCG capsule Commonly known as: ROCALTROL  Take 0.25 mcg by mouth daily.   cetirizine 10 MG chewable tablet Commonly known as: ZYRTEC Chew 10 mg by mouth daily as needed for allergies.   Eliquis 2.5 MG Tabs tablet Generic drug: apixaban Take 2.5 mg by mouth.   fluticasone  50 MCG/ACT nasal spray Commonly known as: FLONASE  as needed.   FreeStyle Libre 14 Day Sensor Misc by Does not apply route.   furosemide  40 MG tablet Commonly known as: LASIX  Take 2 tablets (80 mg total) by mouth 2 (two) times daily. On non dialysis days, Sunday, Tuesday, Thursday, Saturday and Sunday   Global Inject Ease Insulin  Syr 31G X 5/16 0.5 ML Misc Generic drug: Insulin  Syringe-Needle U-100 Inject into the skin.   insulin  aspart 100 UNIT/ML injection Commonly known as: novoLOG  Inject 25-35 Units into the skin 3 (three) times daily with meals. Inject 35 units subcutaneously daily before breakfast, 25 units before lunch and 35 units before supper   isosorbide  mononitrate 30 MG 24 hr tablet Commonly known as: IMDUR  Take 1 tablet (30 mg total) by mouth daily.   lovastatin 40 MG tablet Commonly known as: MEVACOR Take 40 mg by mouth daily with supper.   metoprolol  succinate 50 MG 24 hr tablet Commonly known as: TOPROL -XL Take 50 mg by mouth in the morning and at bedtime. Take with or immediately following a meal.   midodrine 10 MG tablet Commonly known as: PROAMATINE Take 10 mg by mouth 3 (three) times a week. On dialysis days and PRN on non-dialysis days   nitroGLYCERIN  0.4 MG SL tablet Commonly known as: NITROSTAT  Place 1 tablet (0.4 mg total) under the tongue every 5 (five) minutes as needed for chest pain.   OneTouch Delica Plus Lancet30G Misc USE 1 LANCET 4 TIMES DAILY   OneTouch Ultra test strip Generic drug: glucose blood TEST BLOOD SUGAR 4 TIMES DAILY   sertraline  50 MG  tablet Commonly known as: ZOLOFT  Take 50 mg by mouth at bedtime.   tamsulosin  0.4 MG Caps capsule Commonly known as: FLOMAX  TAKE (1) CAPSULE BY MOUTH EVERY DAY   Tresiba 100 UNIT/ML Soln Generic drug: Insulin  Degludec Inject 35 Units into the skin at bedtime.        Contact information for follow-up providers     Alluri, Krishna C, MD. Go in 1 week(s).   Specialty: Cardiology Contact information: [address] [place] [address] [phone]              Contact information for after-discharge care     Home Medical Care     [facility]) .   Service: Home Health Services Contact information: [address] [place]  [phone]                    Discharge Exam: James  Clarke   06/23/24 0500 06/23/24 0818 06/24/24 0603  Weight: 133 kg 133.5 kg 133.5 kg   GEN: NAD SKIN: Warm and dry EYES: No pallor or icterus ENT: MMM CV: RRR PULM: Mild bibasilar rales, no wheezing ABD: soft, ND, NT, +BS CNS: AAO x 3, non focal EXT: No edema or tenderness   Condition at discharge: good  The results of significant diagnostics from this hospitalization (including imaging, microbiology, ancillary and laboratory) are listed below for reference.   Imaging Studies: ECHOCARDIOGRAM COMPLETE Result Date: 06/21/2024    ECHOCARDIOGRAM REPORT   Patient Name:   James Clarke Date of Exam: 06/21/2024 Medical Rec #:  [id number]       Height:       69.0 in Accession #:    [id number]      Weight:       306.4 lb Date of Birth:  Aug 02, 1940      BSA:          2.477 m Patient Age:    82 years        BP:           142/71 mmHg Patient Gender: M               HR:           65 bpm. Exam Location:  [facility] Procedure: 2D Echo, Color Doppler and Cardiac Doppler (Both Spectral and Color            Flow Doppler were utilized during procedure). Indications:     CHF--acute systolic I50.21  History:         Patient has prior history of  Echocardiogram examinations, most                  recent 05/26/2023. Risk Factors:Dyslipidemia and Diabetes.  Sonographer:     Christopher Furnace Referring Phys:  [id number] MAUDE MARLA DART Diagnosing Phys: Cara JONETTA Lovelace MD  Sonographer Comments: Technically challenging study due to limited acoustic windows, no apical window, no subcostal window and patient is obese. IMPRESSIONS  1. Technically difficult study.  2. Left ventricular ejection fraction, by estimation, is 35 to 40%. The left ventricle has moderately decreased function. The left ventricle demonstrates global hypokinesis. The left ventricular internal cavity size was moderately dilated. There is moderate concentric left ventricular hypertrophy. Left ventricular diastolic function could not be evaluated.  3. Right ventricular systolic function is normal. The right ventricular size is normal.  4. The mitral valve is normal in structure. No evidence of mitral valve regurgitation.  5. The aortic valve is normal in structure. Aortic valve regurgitation is not visualized. Conclusion(s)/Recommendation(s): Poor windows for evaluation of left ventricular function by transthoracic echocardiography. Would recommend an alternative means of evaluation. FINDINGS  Left Ventricle: Left ventricular ejection fraction, by estimation, is 35 to 40%. The left ventricle has moderately decreased function. The left ventricle demonstrates global hypokinesis. Strain was performed and the global longitudinal strain is indeterminate. The left ventricular internal cavity size was moderately dilated. There is moderate concentric left ventricular hypertrophy. Left ventricular diastolic function could not be evaluated. Right Ventricle: The right ventricular size is normal. No increase in right ventricular wall thickness. Right ventricular systolic function is normal. Left Atrium: Left atrial size was normal in size. Right Atrium: Right atrial size was normal in size. Pericardium: The  pericardium was not well visualized. Mitral Valve: The mitral valve is normal in structure. No evidence of mitral valve regurgitation. Tricuspid Valve: The tricuspid valve is  normal in structure. Tricuspid valve regurgitation is not demonstrated. Aortic Valve: The aortic valve is normal in structure. Aortic valve regurgitation is not visualized. Pulmonic Valve: The pulmonic valve was normal in structure. Pulmonic valve regurgitation is not visualized. Aorta: The ascending aorta was not well visualized. IAS/Shunts: No atrial level shunt detected by color flow Doppler. Additional Comments: Technically difficult study. 3D was performed not requiring image post processing on an independent workstation and was indeterminate.  LEFT VENTRICLE PLAX 2D LVIDd:         5.80 cm LVIDs:         4.70 cm LV PW:         1.40 cm LV IVS:        1.60 cm LVOT diam:     2.10 cm LVOT Area:     3.46 cm  LEFT ATRIUM         Index LA diam:    4.20 cm 1.70 cm/m   AORTA Ao Root diam: 3.10 cm  SHUNTS Systemic Diam: 2.10 cm Cara JONETTA Lovelace MD Electronically signed by Cara JONETTA Lovelace MD Signature Date/Time: 06/21/2024/11:25:48 PM    Final    DG Chest 1 View Result Date: 06/20/2024 CLINICAL DATA:  Short of breath. EXAM: CHEST  1 VIEW COMPARISON:  05/27/2023. FINDINGS: Mild enlargement of the cardiac silhouette. No mediastinal or hilar masses. Right internal jugular tunneled dual lumen central venous catheter is stable. Bilateral interstitial thickening is similar to the prior radiograph. No lung consolidation. No pleural effusion or pneumothorax. Skeletal structures are grossly intact. IMPRESSION: 1. No acute cardiopulmonary disease. 2. Mild cardiomegaly and chronic interstitial thickening. Electronically Signed   By: Alm Parkins M.D.   On: 06/20/2024 16:00   PERIPHERAL VASCULAR CATHETERIZATION Result Date: 06/03/2024 See surgical note for result.   Microbiology: Results for orders placed or performed during the hospital  encounter of 06/20/24  Resp panel by RT-PCR (RSV, Flu A&B, Covid) Anterior Nasal Swab     Status: None   Collection Time: 06/20/24  3:16 PM   Specimen: Anterior Nasal Swab  Result Value Ref Range Status   SARS Coronavirus 2 by RT PCR NEGATIVE NEGATIVE Final    Comment: (NOTE) SARS-CoV-2 target nucleic acids are NOT DETECTED.  The SARS-CoV-2 RNA is generally detectable in upper respiratory specimens during the acute phase of infection. The lowest concentration of SARS-CoV-2 viral copies this assay can detect is 138 copies/mL. A negative result does not preclude SARS-Cov-2 infection and should not be used as the sole basis for treatment or other patient management decisions. A negative result may occur with  improper specimen collection/handling, submission of specimen other than nasopharyngeal swab, presence of viral mutation(s) within the areas targeted by this assay, and inadequate number of viral copies(<138 copies/mL). A negative result must be combined with clinical observations, patient history, and epidemiological information. The expected result is Negative.  Fact Sheet for Patients:  bloggercourse.com  Fact Sheet for Healthcare Providers:  seriousbroker.it  This test is no t yet approved or cleared by the [place]  FDA and  has been authorized for detection and/or diagnosis of SARS-CoV-2 by FDA under an Emergency Use Authorization (EUA). This EUA will remain  in effect (meaning this test can be used) for the duration of the COVID-19 declaration under Section 564(b)(1) of the Act, 21 U.S.C.section 360bbb-3(b)(1), unless the authorization is terminated  or revoked sooner.       Influenza A by PCR NEGATIVE NEGATIVE Final   Influenza B by PCR NEGATIVE NEGATIVE  Final    Comment: (NOTE) The Xpert Xpress SARS-CoV-2/FLU/RSV plus assay is intended as an aid in the diagnosis of influenza from Nasopharyngeal swab specimens  and should not be used as a sole basis for treatment. Nasal washings and aspirates are unacceptable for Xpert Xpress SARS-CoV-2/FLU/RSV testing.  Fact Sheet for Patients: bloggercourse.com  Fact Sheet for Healthcare Providers: seriousbroker.it  This test is not yet approved or cleared by the [place]  FDA and has been authorized for detection and/or diagnosis of SARS-CoV-2 by FDA under an Emergency Use Authorization (EUA). This EUA will remain in effect (meaning this test can be used) for the duration of the COVID-19 declaration under Section 564(b)(1) of the Act, 21 U.S.C. section 360bbb-3(b)(1), unless the authorization is terminated or revoked.     Resp Syncytial Virus by PCR NEGATIVE NEGATIVE Final    Comment: (NOTE) Fact Sheet for Patients: bloggercourse.com  Fact Sheet for Healthcare Providers: seriousbroker.it  This test is not yet approved or cleared by the [place]  FDA and has been authorized for detection and/or diagnosis of SARS-CoV-2 by FDA under an Emergency Use Authorization (EUA). This EUA will remain in effect (meaning this test can be used) for the duration of the COVID-19 declaration under Section 564(b)(1) of the Act, 21 U.S.C. section 360bbb-3(b)(1), unless the authorization is terminated or revoked.  Performed at [facility], [address] [place], [place] [address]     Labs: CBC: Recent Labs  Lab 06/20/24 1516 06/22/24 1250 06/24/24 0430  WBC 13.2* 12.1* 11.6*  NEUTROABS 10.5*  --   --   HGB 9.7* 9.5* 11.0*  HCT 31.6* 30.1* 34.1*  MCV 95.5 92.9 90.9  PLT 357 353 409*   Basic Metabolic Panel: Recent Labs  Lab 06/20/24 1516 06/21/24 0444 06/22/24 0516 06/23/24 0453 06/24/24 0429 06/24/24 0430  NA 141 137 138 136  --  131*  K 3.6 3.9 3.8 3.4*  --  3.7  CL 102 100 99 98  --  93*  CO2 27 24 28 25   --  25  GLUCOSE  144* 253* 202* 173*  --  188*  BUN 44* 47* 34* 52*  --  45*  CREATININE 3.21* 3.65* 2.98* 3.66*  --  3.98*  CALCIUM 8.9 8.5* 8.5* 8.8*  --  9.2  MG  --  3.9*  --   --  2.4  --    Liver Function Tests: Recent Labs  Lab 06/20/24 1516  AST 21  ALT 17  ALKPHOS 95  BILITOT 0.2  PROT 7.0  ALBUMIN 3.0*   CBG: Recent Labs  Lab 06/23/24 1456 06/23/24 1641 06/23/24 2115 06/24/24 0823 06/24/24 1217  GLUCAP 315* 333* 244* 204* 299*    Discharge time spent: greater than 30 minutes.  Signed: AIDA CHO, MD [facility] 06/24/2024

## 2024-10-05 ENCOUNTER — Ambulatory Visit: Admitting: Urology

## 2024-10-07 ENCOUNTER — Ambulatory Visit: Admitting: Urology

## 2024-10-08 ENCOUNTER — Ambulatory Visit: Admitting: Urology

## 2024-12-14 ENCOUNTER — Ambulatory Visit: Admitting: Family
